# Patient Record
Sex: Female | Born: 1965
Health system: Southern US, Community
[De-identification: ages and names within clinical notes are randomized; demographics above are authoritative.]

## PROBLEM LIST (undated history)

## (undated) DIAGNOSIS — I4729 Other ventricular tachycardia: Secondary | ICD-10-CM

## (undated) DIAGNOSIS — D689 Coagulation defect, unspecified: Secondary | ICD-10-CM

## (undated) DIAGNOSIS — Z91199 Patient's noncompliance with other medical treatment and regimen due to unspecified reason: Secondary | ICD-10-CM

## (undated) DIAGNOSIS — I10 Essential (primary) hypertension: Secondary | ICD-10-CM

## (undated) DIAGNOSIS — J189 Pneumonia, unspecified organism: Secondary | ICD-10-CM

## (undated) DIAGNOSIS — R011 Cardiac murmur, unspecified: Secondary | ICD-10-CM

## (undated) DIAGNOSIS — I639 Cerebral infarction, unspecified: Secondary | ICD-10-CM

## (undated) DIAGNOSIS — I480 Paroxysmal atrial fibrillation: Secondary | ICD-10-CM

## (undated) DIAGNOSIS — I472 Ventricular tachycardia: Secondary | ICD-10-CM

## (undated) DIAGNOSIS — G459 Transient cerebral ischemic attack, unspecified: Secondary | ICD-10-CM

## (undated) DIAGNOSIS — Z9119 Patient's noncompliance with other medical treatment and regimen: Secondary | ICD-10-CM

## (undated) DIAGNOSIS — I4819 Other persistent atrial fibrillation: Secondary | ICD-10-CM

## (undated) HISTORY — DX: Coagulation defect, unspecified: D68.9

## (undated) HISTORY — DX: Other ventricular tachycardia: I47.29

## (undated) HISTORY — DX: Ventricular tachycardia: I47.2

## (undated) HISTORY — DX: Patient's noncompliance with other medical treatment and regimen: Z91.19

## (undated) HISTORY — DX: Essential (primary) hypertension: I10

## (undated) HISTORY — DX: Patient's noncompliance with other medical treatment and regimen due to unspecified reason: Z91.199

---

## 1990-09-15 HISTORY — PX: TUBAL LIGATION: SHX77

## 2002-12-15 ENCOUNTER — Emergency Department (HOSPITAL_COMMUNITY): Admission: EM | Admit: 2002-12-15 | Discharge: 2002-12-15 | Payer: Self-pay | Admitting: Emergency Medicine

## 2003-09-16 ENCOUNTER — Emergency Department (HOSPITAL_COMMUNITY): Admission: EM | Admit: 2003-09-16 | Discharge: 2003-09-17 | Payer: Self-pay | Admitting: Emergency Medicine

## 2004-02-08 ENCOUNTER — Ambulatory Visit (HOSPITAL_COMMUNITY): Admission: RE | Admit: 2004-02-08 | Discharge: 2004-02-08 | Payer: Self-pay | Admitting: Family Medicine

## 2004-04-23 ENCOUNTER — Other Ambulatory Visit: Admission: RE | Admit: 2004-04-23 | Discharge: 2004-04-23 | Payer: Self-pay | Admitting: Family Medicine

## 2004-12-13 ENCOUNTER — Ambulatory Visit: Payer: Self-pay | Admitting: Family Medicine

## 2006-01-07 ENCOUNTER — Ambulatory Visit: Payer: Self-pay | Admitting: Family Medicine

## 2006-02-20 ENCOUNTER — Ambulatory Visit: Payer: Self-pay | Admitting: Family Medicine

## 2006-02-23 ENCOUNTER — Ambulatory Visit: Payer: Self-pay | Admitting: Family Medicine

## 2006-04-06 ENCOUNTER — Ambulatory Visit (HOSPITAL_COMMUNITY): Admission: RE | Admit: 2006-04-06 | Discharge: 2006-04-06 | Payer: Self-pay | Admitting: Family Medicine

## 2006-06-25 ENCOUNTER — Ambulatory Visit: Payer: Self-pay | Admitting: Nurse Practitioner

## 2006-08-17 ENCOUNTER — Ambulatory Visit: Payer: Self-pay | Admitting: Family Medicine

## 2007-04-09 ENCOUNTER — Ambulatory Visit (HOSPITAL_COMMUNITY): Admission: RE | Admit: 2007-04-09 | Discharge: 2007-04-09 | Payer: Self-pay | Admitting: Obstetrics

## 2009-06-05 ENCOUNTER — Emergency Department (HOSPITAL_COMMUNITY): Admission: EM | Admit: 2009-06-05 | Discharge: 2009-06-05 | Payer: Self-pay | Admitting: Family Medicine

## 2010-10-06 ENCOUNTER — Encounter: Payer: Self-pay | Admitting: Obstetrics

## 2011-03-05 ENCOUNTER — Emergency Department (HOSPITAL_COMMUNITY): Payer: Self-pay

## 2011-03-05 ENCOUNTER — Inpatient Hospital Stay (HOSPITAL_COMMUNITY)
Admission: EM | Admit: 2011-03-05 | Discharge: 2011-03-11 | DRG: 193 | Disposition: A | Discharge status: Home / Self Care | Payer: Self-pay | Attending: Internal Medicine | Admitting: Internal Medicine

## 2011-03-05 DIAGNOSIS — F17200 Nicotine dependence, unspecified, uncomplicated: Secondary | ICD-10-CM | POA: Diagnosis present

## 2011-03-05 DIAGNOSIS — J189 Pneumonia, unspecified organism: Principal | ICD-10-CM | POA: Diagnosis present

## 2011-03-05 DIAGNOSIS — R6510 Systemic inflammatory response syndrome (SIRS) of non-infectious origin without acute organ dysfunction: Secondary | ICD-10-CM | POA: Diagnosis present

## 2011-03-05 DIAGNOSIS — J9600 Acute respiratory failure, unspecified whether with hypoxia or hypercapnia: Secondary | ICD-10-CM | POA: Diagnosis present

## 2011-03-05 DIAGNOSIS — I4891 Unspecified atrial fibrillation: Secondary | ICD-10-CM | POA: Diagnosis present

## 2011-03-05 DIAGNOSIS — I4892 Unspecified atrial flutter: Secondary | ICD-10-CM | POA: Diagnosis present

## 2011-03-05 DIAGNOSIS — I119 Hypertensive heart disease without heart failure: Secondary | ICD-10-CM | POA: Diagnosis present

## 2011-03-05 DIAGNOSIS — F1210 Cannabis abuse, uncomplicated: Secondary | ICD-10-CM | POA: Diagnosis present

## 2011-03-05 DIAGNOSIS — E669 Obesity, unspecified: Secondary | ICD-10-CM | POA: Diagnosis present

## 2011-03-05 LAB — POCT I-STAT, CHEM 8
BUN: 13 mg/dL (ref 6–23)
Chloride: 108 mEq/L (ref 96–112)
HCT: 46 % (ref 36.0–46.0)
Sodium: 143 mEq/L (ref 135–145)
TCO2: 24 mmol/L (ref 0–100)

## 2011-03-05 LAB — CBC
MCH: 26.3 pg (ref 26.0–34.0)
MCV: 79.3 fL (ref 78.0–100.0)
Platelets: 200 10*3/uL (ref 150–400)
RBC: 5.13 MIL/uL — ABNORMAL HIGH (ref 3.87–5.11)

## 2011-03-05 LAB — DIFFERENTIAL
Lymphocytes Relative: 19 % (ref 12–46)
Monocytes Absolute: 0.6 10*3/uL (ref 0.1–1.0)
Monocytes Relative: 4 % (ref 3–12)
Neutro Abs: 12.2 10*3/uL — ABNORMAL HIGH (ref 1.7–7.7)

## 2011-03-06 ENCOUNTER — Inpatient Hospital Stay (HOSPITAL_COMMUNITY): Payer: Self-pay

## 2011-03-06 LAB — RAPID URINE DRUG SCREEN, HOSP PERFORMED
Amphetamines: POSITIVE — AB
Barbiturates: NOT DETECTED
Benzodiazepines: NOT DETECTED
Cocaine: NOT DETECTED
Opiates: NOT DETECTED

## 2011-03-06 LAB — GLUCOSE, CAPILLARY

## 2011-03-06 LAB — BLOOD GAS, ARTERIAL
Bicarbonate: 24.3 mEq/L — ABNORMAL HIGH (ref 20.0–24.0)
FIO2: 1 %
Patient temperature: 98.6
TCO2: 25.8 mmol/L (ref 0–100)
pCO2 arterial: 47.6 mmHg — ABNORMAL HIGH (ref 35.0–45.0)
pH, Arterial: 7.329 — ABNORMAL LOW (ref 7.350–7.400)
pO2, Arterial: 61.2 mmHg — ABNORMAL LOW (ref 80.0–100.0)

## 2011-03-06 LAB — POCT I-STAT 3, ART BLOOD GAS (G3+)
Acid-base deficit: 1 mmol/L (ref 0.0–2.0)
Bicarbonate: 21.9 mEq/L (ref 20.0–24.0)
O2 Saturation: 83 %
Patient temperature: 98.6
TCO2: 23 mmol/L (ref 0–100)
TCO2: 23 mmol/L (ref 0–100)
pCO2 arterial: 33.1 mmHg — ABNORMAL LOW (ref 35.0–45.0)
pH, Arterial: 7.425 — ABNORMAL HIGH (ref 7.350–7.400)

## 2011-03-06 LAB — CBC
HCT: 41.2 % (ref 36.0–46.0)
Hemoglobin: 13.4 g/dL (ref 12.0–15.0)
MCH: 26 pg (ref 26.0–34.0)
MCV: 79.8 fL (ref 78.0–100.0)
RBC: 5.16 MIL/uL — ABNORMAL HIGH (ref 3.87–5.11)
WBC: 18.6 10*3/uL — ABNORMAL HIGH (ref 4.0–10.5)

## 2011-03-06 LAB — BASIC METABOLIC PANEL
Calcium: 8.6 mg/dL (ref 8.4–10.5)
Chloride: 105 mEq/L (ref 96–112)
Creatinine, Ser: 0.71 mg/dL (ref 0.50–1.10)
Glucose, Bld: 157 mg/dL — ABNORMAL HIGH (ref 70–99)
Potassium: 3.5 mEq/L (ref 3.5–5.1)

## 2011-03-06 LAB — CARDIAC PANEL(CRET KIN+CKTOT+MB+TROPI)
CK, MB: 2.5 ng/mL (ref 0.3–4.0)
CK, MB: 2.5 ng/mL (ref 0.3–4.0)
Relative Index: 1.7 (ref 0.0–2.5)
Total CK: 142 U/L (ref 7–177)

## 2011-03-07 ENCOUNTER — Inpatient Hospital Stay (HOSPITAL_COMMUNITY): Payer: Self-pay

## 2011-03-07 LAB — POCT I-STAT 3, ART BLOOD GAS (G3+)
O2 Saturation: 91 %
TCO2: 24 mmol/L (ref 0–100)
pH, Arterial: 7.439 — ABNORMAL HIGH (ref 7.350–7.400)

## 2011-03-07 LAB — BASIC METABOLIC PANEL
Calcium: 8.8 mg/dL (ref 8.4–10.5)
GFR calc Af Amer: >60 mL/min (ref >60)
GFR calc non Af Amer: >60 mL/min (ref >60)
Glucose, Bld: 116 mg/dL — ABNORMAL HIGH (ref 70–99)
Sodium: 138 mEq/L (ref 135–145)

## 2011-03-07 LAB — PHOSPHORUS: Phosphorus: 2.6 mg/dL (ref 2.3–4.6)

## 2011-03-07 LAB — HEMOGLOBIN A1C
Hgb A1c MFr Bld: 6.6 % — ABNORMAL HIGH (ref <5.7)
Mean Plasma Glucose: 143 mg/dL — ABNORMAL HIGH (ref <117)

## 2011-03-07 LAB — DIFFERENTIAL
Basophils Relative: 0 % (ref 0–1)
Eosinophils Absolute: 0 10*3/uL (ref 0.0–0.7)
Eosinophils Relative: 0 % (ref 0–5)
Lymphocytes Relative: 12 % (ref 12–46)
Lymphs Abs: 2.2 10*3/uL (ref 0.7–4.0)
Monocytes Relative: 4 % (ref 3–12)
Neutro Abs: 16 10*3/uL — ABNORMAL HIGH (ref 1.7–7.7)

## 2011-03-07 LAB — CBC
MCH: 25.5 pg — ABNORMAL LOW (ref 26.0–34.0)
MCHC: 32 g/dL (ref 30.0–36.0)
MCV: 79.6 fL (ref 78.0–100.0)
Platelets: 198 10*3/uL (ref 150–400)
RBC: 4.51 MIL/uL (ref 3.87–5.11)
WBC: 19.1 10*3/uL — ABNORMAL HIGH (ref 4.0–10.5)

## 2011-03-07 LAB — T4, FREE: Free T4: 0.94 ng/dL (ref 0.80–1.80)

## 2011-03-08 ENCOUNTER — Inpatient Hospital Stay (HOSPITAL_COMMUNITY): Payer: Self-pay

## 2011-03-08 LAB — BASIC METABOLIC PANEL
BUN: 7 mg/dL (ref 6–23)
Creatinine, Ser: 0.69 mg/dL (ref 0.50–1.10)
GFR calc Af Amer: >60 mL/min (ref >60)
GFR calc non Af Amer: >60 mL/min (ref >60)

## 2011-03-08 LAB — DIFFERENTIAL
Eosinophils Relative: 0 % (ref 0–5)
Lymphocytes Relative: 13 % (ref 12–46)
Lymphs Abs: 2.4 10*3/uL (ref 0.7–4.0)
Monocytes Absolute: 0.7 10*3/uL (ref 0.1–1.0)

## 2011-03-08 LAB — CBC
HCT: 35.6 % — ABNORMAL LOW (ref 36.0–46.0)
MCH: 25.3 pg — ABNORMAL LOW (ref 26.0–34.0)
MCHC: 31.7 g/dL (ref 30.0–36.0)
MCV: 79.8 fL (ref 78.0–100.0)
RDW: 14.9 % (ref 11.5–15.5)

## 2011-03-08 LAB — HIV ANTIBODY (ROUTINE TESTING W REFLEX): HIV: NONREACTIVE

## 2011-03-09 ENCOUNTER — Inpatient Hospital Stay (HOSPITAL_COMMUNITY): Payer: Self-pay

## 2011-03-09 DIAGNOSIS — I4892 Unspecified atrial flutter: Secondary | ICD-10-CM

## 2011-03-09 LAB — CBC
HCT: 34.5 % — ABNORMAL LOW (ref 36.0–46.0)
MCV: 78.6 fL (ref 78.0–100.0)
RDW: 14.6 % (ref 11.5–15.5)
WBC: 14.7 10*3/uL — ABNORMAL HIGH (ref 4.0–10.5)

## 2011-03-09 LAB — DIFFERENTIAL
Eosinophils Relative: 1 % (ref 0–5)
Lymphocytes Relative: 17 % (ref 12–46)
Lymphs Abs: 2.4 10*3/uL (ref 0.7–4.0)

## 2011-03-09 LAB — BASIC METABOLIC PANEL
BUN: 7 mg/dL (ref 6–23)
CO2: 27 mEq/L (ref 19–32)
Chloride: 104 mEq/L (ref 96–112)
GFR calc non Af Amer: >60 mL/min (ref >60)
Glucose, Bld: 111 mg/dL — ABNORMAL HIGH (ref 70–99)
Potassium: 3.7 mEq/L (ref 3.5–5.1)

## 2011-03-10 ENCOUNTER — Inpatient Hospital Stay (HOSPITAL_COMMUNITY): Payer: Self-pay

## 2011-03-10 DIAGNOSIS — I4891 Unspecified atrial fibrillation: Secondary | ICD-10-CM

## 2011-03-10 LAB — DIFFERENTIAL
Eosinophils Relative: 2 % (ref 0–5)
Lymphocytes Relative: 19 % (ref 12–46)
Lymphs Abs: 3 10*3/uL (ref 0.7–4.0)
Monocytes Absolute: 0.8 10*3/uL (ref 0.1–1.0)
Monocytes Relative: 6 % (ref 3–12)

## 2011-03-10 LAB — BASIC METABOLIC PANEL
Calcium: 8.7 mg/dL (ref 8.4–10.5)
GFR calc Af Amer: >60 mL/min (ref >60)
GFR calc non Af Amer: >60 mL/min (ref >60)
Potassium: 3.5 mEq/L (ref 3.5–5.1)
Sodium: 139 mEq/L (ref 135–145)

## 2011-03-10 LAB — CBC
HCT: 35.7 % — ABNORMAL LOW (ref 36.0–46.0)
MCH: 26 pg (ref 26.0–34.0)
MCHC: 33.6 g/dL (ref 30.0–36.0)
MCV: 77.4 fL — ABNORMAL LOW (ref 78.0–100.0)
RDW: 14.4 % (ref 11.5–15.5)

## 2011-03-10 LAB — GLUCOSE, CAPILLARY: Glucose-Capillary: 132 mg/dL — ABNORMAL HIGH (ref 70–99)

## 2011-03-10 LAB — HEPARIN LEVEL (UNFRACTIONATED): Heparin Unfractionated: 0.16 IU/mL — ABNORMAL LOW (ref 0.30–0.70)

## 2011-03-10 NOTE — Group Therapy Note (Signed)
[name], [name]                 ACCOUNT NO.:  [id number]  MEDICAL RECORD NO.:  [id number]  LOCATION:  2111                         FACILITY:  [facility]  PHYSICIAN:  [name] Massed, MD    DATE OF BIRTH:  05/09/1966                                PROGRESS NOTE   ONGOING MEDICAL ISSUES: 1. Acute hypoxic respiratory failure secondary to acute lung injury     now resolved. 2. Community-acquired pneumonia. 3. Uncontrolled hypertension significantly better. 4. Tobacco abuse. 5. Methamphetamines detected in urinary drug screen. 6. Obesity.  CONSULTANTS: On the case none.  HISTORY OF PRESENT ILLNESS: The patient is a very pleasant 45 year old African American female who presented to [facility] on the 20th of June with a complaint of cough and shortness of breath.  She evaluated in the ED and found to be profoundly hypoxic and also had significantly elevated blood pressures and was admitted to the hospitalist service in the ICU for further evaluation and management.  For further details, see the history and physical that was dictated by Dr. Suanne Marker on admission.  PERTINENT RADIOLOGICAL STUDIES: 1. X-ray of the chest done on March 05, 2011 showed right upper lobe     pneumonia. 2. X-ray of the chest on March 06, 2011, showed marked worsening of the     right greater than left airspace disease compatible with     progressive pneumonia and/or edema. 3. Portable chest x-ray done on March 07, 2011 showed extensive     airspace disease bilaterally unchanged. 4. Portable x-ray done on March 08, 2011 showed improved airspace     disease. 5. Portable chest x-ray done on March 09, 1011 continues to show     improved bilateral pulmonary infiltrates.  PERTINENT LABORATORY STUDIES: 1. CBC on admission showed WBC of 15.9. 2. ABG on admission showed a pH of 7.4, pCO2 32 and a pO2 of 43. 3. BNP admission was 173. 4. Cardiac enzymes were cycled and these were negative. 5. Procalcitonin was  less than 0.10. 6. Urinary drug screen was positive for amphetamines. 7. Blood cultures March 06, 2011 are negative so far. 8. TSH was 0.704. 9. Hemoglobin A1c is 6.6. 10.HIV antibody was negative. 11.CBC today shows a WBC decreased to 14.7, hemoglobin of 11.1 a     platelet count of 186. 12.BNP is 301. 13.BMET shows a chemistry 140, potassium of 3.7, chloride of 104,     bicarb of 27, glucose of 111, BUN 7, creatinine of 0.62 and calcium     of 3.4.  BRIEF HOSPITAL COURSE: 1. Acute hypoxic failure.  This is secondary to community-acquired     pneumonia.  Likely causing acute lung injury.  She was admitted to     the hospitalist service and admitted to the ICU as well initially.     She was started on broad-spectrum antibiotics, which included     vancomycin, Levaquin, and aztreonam.  She continued to require x-     rays amounts of FIO2 via nasal cannula to maintain her O2     saturations.  With continued medical treatment; however, she     significantly improved and over the past few days  she was actually     been titrated off her oxygen.  Currently her vital signs show O2     saturation of 94% on room air.  Given a significant clinical     improvement it is felt that she no longer requires intensive care     monitoring and is felt to be stable to be transferred to the     regular medical floor.  We will continue her antibiotics as is for     today and we will to be de-escalated in the next 24-48 hours.  A 2-     D echocardiogram is pending and that will need to be done prior to     her discharge as well. 2. Uncontrolled hypertension.  The patient was found to have     uncontrolled hypertension and was started on amlodipine here in the     ICU her blood pressure continues to be high and we will today add     hydrochlorothiazide for further optimal control. 3. Tobacco and polysubstance abuse.  She has been counseled     extensively by me.  DISPOSITION: [name] is significantly  better, but will need a few more days in the hospital for further optimization of her hypertension and to continue observation for her pneumonia.  Otherwise, she can be discharged home.  The patient already has a [facility] appointment set up by our case Theme park manager on April 08, 2011.  Further hospital course, discharge medications, discharge summary will be dictated by the discharging physician.     [name] Massed, MD     SG/MEDQ  D:  03/09/2011  T:  03/09/2011  Job:  [id number]  Electronically Signed by [name] Massed  on 03/10/2011 04:26:04 PM

## 2011-03-10 NOTE — Group Therapy Note (Signed)
  [name], [name]                 ACCOUNT NO.:  [id number]  MEDICAL RECORD NO.:  [id number]  LOCATION:  3733                         FACILITY:  [facility]  PHYSICIAN:  [name] Massed, MD    DATE OF BIRTH:  July 22, 1966                                PROGRESS NOTE   ADDENDUM: After the patient was transferred from the ICU to a regular floor, it was noted that heart rate was in the 140s to 150s.  A 12-lead EKG done showed to be consistent with atrial flutter.  The patient was then given IV Lopressor, which transiently decreased the heart rate back down to the 120s, however, it subsequently rebounded.  The patient was then transferred to telemetry unit where she has been started on a Cardizem infusion.  Her Cardizem infusion has been slowly increased to 15 mg an hour, however, the patient continues to be pretty tachycardic with a heart rate in the 140s to 150s.  I have already spoken to Dr. Viann Fish, Cardiology on-call and he will evaluate and provide [place] with further recommendations in managing this patient.  Please note that this patient is otherwise hemodynamically stable and asymptomatic.     [name] Massed, MD     SG/MEDQ  D:  03/09/2011  T:  03/09/2011  Job:  [id number]  Electronically Signed by [name] Massed  on 03/10/2011 04:26:27 PM

## 2011-03-11 LAB — BASIC METABOLIC PANEL
CO2: 27 mEq/L (ref 19–32)
Calcium: 9.1 mg/dL (ref 8.4–10.5)
Creatinine, Ser: 0.76 mg/dL (ref 0.50–1.10)
GFR calc non Af Amer: >60 mL/min (ref >60)
Glucose, Bld: 100 mg/dL — ABNORMAL HIGH (ref 70–99)
Sodium: 137 mEq/L (ref 135–145)

## 2011-03-11 LAB — CBC
Hemoglobin: 11.5 g/dL — ABNORMAL LOW (ref 12.0–15.0)
MCH: 25.3 pg — ABNORMAL LOW (ref 26.0–34.0)
MCHC: 32.6 g/dL (ref 30.0–36.0)
MCV: 77.6 fL — ABNORMAL LOW (ref 78.0–100.0)
Platelets: 246 10*3/uL (ref 150–400)
RBC: 4.55 MIL/uL (ref 3.87–5.11)

## 2011-03-11 LAB — HEPARIN LEVEL (UNFRACTIONATED): Heparin Unfractionated: 0.38 IU/mL (ref 0.30–0.70)

## 2011-03-12 LAB — CULTURE, BLOOD (ROUTINE X 2)
Culture  Setup Time: 201206210854
Culture: NO GROWTH
Culture: NO GROWTH

## 2011-03-12 NOTE — H&P (Signed)
NAMESTEPHENE, [name]                 ACCOUNT NO.:  [id number]  MEDICAL RECORD NO.:  [id number]  LOCATION:  2111                         FACILITY:  [facility]  PHYSICIAN:  Kela Millin, M.D.DATE OF BIRTH:  08-01-1966  DATE OF ADMISSION:  03/05/2011 DATE OF DISCHARGE:                             HISTORY & PHYSICAL   PRIMARY CARE PHYSICIAN:  Unassigned.  CHIEF COMPLAINT:  Cough and shortness of breath.  HISTORY OF PRESENT ILLNESS:  The patient is a 45 year old black female with no significant past medical history who presents with the above complaints.  She states that for the past 2 weeks she has had a cough productive of whitish to tan sputum.  Today, she felt like she was wheezing and was short of breath and so came to the ED.  She admits to midsternal chest pain that is pleuritic in nature - only with coughing. She denies fevers, dysuria, melena, and no hematochezia.  She admits to headaches, denies dizziness, and no focal weakness.  She was seen in the ED and a chest x-ray showed right upper lobe pneumonia, a pro-brain natriuretic peptide was done and came back at 173.  She was hypoxic in the ED with O2 sats in the 80s and she was placed on supplemental O2 and her O2 sats came up to 90%.  An ABG was done and revealed a pH of 7.42 with a pCO2 of 33, pO2 of 45, and O2 sat of 83%.  The patient's blood pressures were also noted to be markedly elevated in the ED at 197/95, she was admitted for further evaluation and management.  PAST MEDICAL HISTORY:  As above.  MEDICATIONS:  None.  ALLERGIES:  PENICILLIN.  SOCIAL HISTORY:  Positive for cigarettes, 8 per day.  She drinks on weekends about 2 alcoholic drinks and 2 beers at the most and positive marijuana use.  FAMILY HISTORY:  Her father had a stroke.  REVIEW OF SYSTEMS:  As per HPI, other review of systems negative.  PHYSICAL EXAMINATION:  GENERAL:  The patient is an obese middle-aged black female with respirations  nonlabored on face mask. VITAL SIGNS:  Her blood pressure 207/90 in the unit, 198/93 on recheck, pulse 56, and O2 sat 98% on the face mask. HEENT:  PERRL.  EOMI.  Moist mucous membranes and no oral exudates. NECK:  Supple, no adenopathy, no thyromegaly, and no JVD. LUNGS:  Bilateral rhonchi, no wheezes. CARDIOVASCULAR:  Regular rate and rhythm.  Normal S1-S2.  No S3 appreciated. ABDOMEN:  Soft, bowel sounds present, nontender, and nondistended.  No organomegaly and no masses palpable. EXTREMITIES:  No cyanosis and no edema. NEUROLOGIC:  She is alert and oriented x3.  Cranial nerves II-XII grossly intact.  Nonfocal exam.  LABORATORY DATA:  As per HPI.  Also, her white cell count is 15.9, hemoglobin 13.5, hematocrit 40.7, and platelet count 200.  Her sodium is 143 with a potassium of 3.8, chloride is 108, glucose is 120, BUN is 13, and creatinine is 0.8.  ASSESSMENT AND PLAN: 1. Community-acquired pneumonia, right upper lobe with hypoxemia - we     will obtain blood cultures, empiric antibiotics, supplemental     oxygen.  Monitor in the Step-Down Unit and consult with Critical     Care is pending clinical course. 2. Malignant hypertension - labetalol drip, cardiac enzymes, and     follow. 3. Marijuana use - Social Work consult for resources to quit.     Kela Millin, M.D.     ACV/MEDQ  D:  03/06/2011  T:  03/06/2011  Job:  [id number]  Electronically Signed by Donnalee Curry M.D. on 03/12/2011 07:37:51 AM

## 2011-03-18 NOTE — Discharge Summary (Signed)
[name], [name]                 ACCOUNT NO.:  [id number]  MEDICAL RECORD NO.:  [id number]  LOCATION:  3734                         FACILITY:  [facility]  PHYSICIAN:  Marinda Elk, M.D.DATE OF BIRTH:  01/27/1966  DATE OF ADMISSION:  03/05/2011 DATE OF DISCHARGE:                              DISCHARGE SUMMARY   PRIMARY CARE DOCTOR:  None.  DISCHARGE DIAGNOSES: 1. Acute respiratory failure secondary to community-acquired     pneumonia. 2. Community-acquired pneumonia. 3. Paroxysmal atrial fibrillation. 4. Hypertension.  DISCHARGE MEDICATIONS: 1. Tylenol 650 mg q.4 h. p.r.n. 2. Guaifenesin one tablet b.i.d. 3. Levofloxacin 750 mg daily. 4. Metoprolol 25 mg b.i.d. 5. Maxzide. 6. Triamterene and hydrochlorothiazide 37.5/25 tablet daily. 7. Ibuprofen 200 mg daily.  CONSULTANTS:  [facility].  Chest x-ray.  Continued to improved aeration with essentially clear lungs, minimal residual, right upper lobe density noted.  Chest x-ray on March 09, 2011, showed improved pulmonary infiltrate.  Chest x-ray on March 08, 2011, showed improved airspace disease.  Chest x-ray on March 07, 2011, showed extensive airspace disease bilaterally, unchanged.  Chest x-ray on March 06, 2011, showed marked worsening of right greater than left airspace disease compatible with pneumonia.  Chest x-ray right upper lobe pneumonia.  BRIEF ADMITTING HISTORY AND PHYSICAL:  This is a 45 year old female with no significant past medical history says that over the past 2 weeks she has productive cough.  She felt like she was wheezing and shortness of breath with midsternal chest pain, pleuritic in nature.  Denies any fever, chills, or hematochezia.  She came in her to the ED.  Chest x-ray showed right upper lobe pneumonia, and we were asked to admit and further evaluate.  Please refer to the dictation from March 09, 2011, for further details.  HOSPITAL COURSE: 1. Acute respiratory failure,  probably secondary to multifactorial     pneumonia and acute lung injury.  She was admitted to the ICU and     was put on IV antibiotics and monitored closely.  She did not     require intubation.  Her shortness of breath improved.  She was     transferred to the floor.  She was satting 99% on room air. 2. Community-acquired pneumonia.  Initially, she was started on broad-     spectrum antibiotics and also vancomycin due to the concern of     MRSA, but by the fourth day, she was doing okay.  She was     transferred to the floor.  Her antibiotics were de-escalated to     Levaquin, which she will take for 1 more day after discharge. 3. Paroxysmal AFib.  On the unit, she developed paroxysmal AFib.  This     was thought to be probably secondary to her pneumonia and her     hypoxia.  She was put on heparin.  Cardiology was consulted.  They     recommended to try to treat once her heart rate improves and became     bradycardia.  They recommended to titrate down her diltiazem and     discontinue the amiodarone.  Her heart rate remained 60.  She    returned  to sinus rhythm in less than 48 hours.  Her heparin was     stopped.  She was monitored on telemetry overnight and she remained     in sinus rhythm.  She will follow up with her primary care doctor     as an outpatient. 4. Hypertension.  She was initially started on hydrochlorothiazide.     She became hypokalemic.  This was repleted.  She was started on     triamterene.  Her blood pressure has remained borderline high     143/86.  This will be monitored and blood pressure medications     titrated up as an outpatient.  VITALS ON THE DAY OF DISCHARGE:  Temperature 98, pulse is 62, respirations 18, blood pressure 143/86.  She was satting 99% on room air.  LABORATORIES ON THE DAY OF DISCHARGE:  Her mag is 2.2.  White count of 11.8, hemoglobin of 11.5, platelet count of 246.  Her sodium was 137, potassium 3.5, chloride 102, glucose of 100,  bicarb of 27, BUN of 10, creatinine 0.7, calcium of 9.1.  DISPOSITION:  The patient will follow up with Dr. Lanora Manis at [facility]  Here, we will check a BMET to see how her potassium and creatinine is doing, and we will titrate blood pressure medications as needed.     Marinda Elk, M.D.     AF/MEDQ  D:  03/11/2011  T:  03/11/2011  Job:  [id number]  Electronically Signed by Marinda Elk M.D. on 03/18/2011 07:29:44 AM

## 2011-03-18 NOTE — Consult Note (Signed)
[name], [name]                 ACCOUNT NO.:  [id number]  MEDICAL RECORD NO.:  [id number]  LOCATION:  3733                         FACILITY:  [facility]  PHYSICIAN:  Georga Hacking, M.D.DATE OF BIRTH:  04/15/1966  DATE OF CONSULTATION:03/09/11                                 CONSULTATION   REASON FOR CONSULTATION:  I was asked to see this 45 year old female for evaluation of atrial flutter.  The patient has been obese and has a history of substance abuse with marijuana and were very mild alcohol usage as well as some mild tobacco abuse.  She was admitted with pneumonia several days ago and after transfer to the floor this morning became more short of breath and was found to be in atrial flutter.  She was treated with diltiazem drip as well as metoprolol and was in atrial flutter 2:1 block and the hospitalist called me to consult.  After giving an additional bolus of Cardizem, she converted to atrial fibrillation, her rate dropped to 100.  She is really not symptomatic at this time.  She has a history of a cardiac murmur.  Reportedly she denies angina and has no PND, orthopnea, or edema normally.  She has a history of hypertension.  She previously saw Dr. Coral Ceo and was treated with blood pressure medicines and stopped taking them on her own and did not have her blood pressure checked.  She has had hypertension since admission requiring labetalol.  PAST MEDICAL HISTORY:  Hypertension, presumed, denies ulcers or diabetes.  Lipid status is unknown.  SURGERY:  None.  ALLERGIES:  PENICILLIN.  MEDICATIONS PRIOR TO ADMISSION:  None.  FAMILY HISTORY:  Father had a previous stroke and also has heart failure.  Mother is alive and well.  SOCIAL HISTORY:  She is never married.  She has 7 children, formally worked as a Lawyer.  Currently has been working at [employer] for about a month.  She drinks two alcoholic drinks and occasional beer on the weekends.  Also  uses marijuana.  No cocaine usage.  She has smoked half a pack of cigarettes per day for about 2 years.  REVIEW OF SYSTEMS:  She has been obese for several years.  She has occasional headaches.  No eye, ear, nose, or throat problems.  No difficulty swallowing.  Denies diarrhea, constipation, or hematochezia. Occasional urinary infections.  No significant arthritis.  Other than as noted above, remainder of the review of systems is unremarkable.  PHYSICAL EXAMINATION:  GENERAL:  She is an obese middle-aged black female currently in no acute distress. VITAL SIGNS:  Blood pressure currently 130/85, pulse is currently 100 and irregular. SKIN:  Warm and dry. ENT:  EOMI.  PERRLA. CNS:  Clear.  Funduscopic exam was not done.  Pharynx is negative. NECK:  Supple without masses, JVD, thyromegaly, or bruits. LUNGS:  Mild rales right upper lobe, clear in the bases. CARDIAC:  Normal S1 and S2, possible S4 heard, very faint 1/6 systolic murmur. ABDOMEN:  Soft, obese and nontender. EXTREMITIES:  Pulses present at 2+.  There is no venous stasis noted and no edema is noted.  A 12-lead EKG shows left axis deviation, T-wave  changes in the lateral leads, voltage for LVH on admission.  She is in atrial flutter on the most recent EKG with 2:1 block, left atrial enlargement.  Chest x-ray on admission showed normal heart size and right upper lobe pneumonia.  IMPRESSION: 1. Atrial flutter which is converted to atrial fibrillation, currently     is under much better control on intravenous diltiazem. 2. Most likely hypertensive heart disease with longstanding     uncontrolled hypertension by history. 3. Obesity. 4. Substance abuse with marijuana, remote alcohol on weekends and     tobacco. 5. Abnormal EKG most likely represents untreated hypertension.  RECOMMENDATIONS:  The patient is currently controlled with atrial fibrillation.  She has been started on heparin.  I would recommend she have a TSH as  well as an echocardiogram.  I would give her a couple doses of amiodarone to see if we can convert her back to sinus rhythm quickly.  Atrial fibrillation is likely secondary to the recent pneumonia and stress as well as probably underlying hypertensive heart disease.  She was counseled about the importance of antihypertensive treatment to prevent long-term complications of cerebrovascular disease, dialysis, or heart disease.  She needs to stop smoking and lose weight.     Georga Hacking, M.D.     WST/MEDQ  D:  03/09/2011  T:  03/09/2011  Job:  [id number]  cc:   [name] Massed, MD  Electronically Signed by Lacretia Nicks. Donnie Aho M.D. on 03/18/2011 05:13:14 PM

## 2011-09-06 ENCOUNTER — Emergency Department (HOSPITAL_COMMUNITY): Payer: Self-pay

## 2011-09-06 ENCOUNTER — Other Ambulatory Visit: Payer: Self-pay

## 2011-09-06 ENCOUNTER — Encounter: Payer: Self-pay | Admitting: Emergency Medicine

## 2011-09-06 ENCOUNTER — Inpatient Hospital Stay (HOSPITAL_COMMUNITY)
Admission: EM | Admit: 2011-09-06 | Discharge: 2011-09-08 | DRG: 193 | Disposition: A | Discharge status: Home / Self Care | Payer: Self-pay | Attending: Internal Medicine | Admitting: Internal Medicine

## 2011-09-06 DIAGNOSIS — J9600 Acute respiratory failure, unspecified whether with hypoxia or hypercapnia: Secondary | ICD-10-CM | POA: Diagnosis present

## 2011-09-06 DIAGNOSIS — R000 Tachycardia, unspecified: Secondary | ICD-10-CM | POA: Diagnosis present

## 2011-09-06 DIAGNOSIS — J189 Pneumonia, unspecified organism: Secondary | ICD-10-CM

## 2011-09-06 DIAGNOSIS — J9690 Respiratory failure, unspecified, unspecified whether with hypoxia or hypercapnia: Secondary | ICD-10-CM

## 2011-09-06 DIAGNOSIS — I4892 Unspecified atrial flutter: Secondary | ICD-10-CM | POA: Diagnosis present

## 2011-09-06 HISTORY — DX: Pneumonia, unspecified organism: J18.9

## 2011-09-06 HISTORY — DX: Paroxysmal atrial fibrillation: I48.0

## 2011-09-06 LAB — URINALYSIS, ROUTINE W REFLEX MICROSCOPIC
Bilirubin Urine: NEGATIVE
Ketones, ur: NEGATIVE mg/dL
Leukocytes, UA: NEGATIVE
Nitrite: NEGATIVE
Specific Gravity, Urine: 1.012 (ref 1.005–1.030)
Urobilinogen, UA: 0.2 mg/dL (ref 0.0–1.0)

## 2011-09-06 LAB — BASIC METABOLIC PANEL
BUN: 9 mg/dL (ref 6–23)
CO2: 24 mEq/L (ref 19–32)
Calcium: 9 mg/dL (ref 8.4–10.5)
Chloride: 103 mEq/L (ref 96–112)
Creatinine, Ser: 0.8 mg/dL (ref 0.50–1.10)
Glucose, Bld: 132 mg/dL — ABNORMAL HIGH (ref 70–99)

## 2011-09-06 LAB — CBC
HCT: 41.8 % (ref 36.0–46.0)
HCT: 39.5 % (ref 36.0–46.0)
MCH: 26.3 pg (ref 26.0–34.0)
MCHC: 33 g/dL (ref 30.0–36.0)
MCV: 79.6 fL (ref 78.0–100.0)
MCV: 78.7 fL (ref 78.0–100.0)
Platelets: 223 10*3/uL (ref 150–400)
Platelets: 166 10*3/uL (ref 150–400)
RBC: 5.02 MIL/uL (ref 3.87–5.11)
RDW: 14.8 % (ref 11.5–15.5)
RDW: 14.6 % (ref 11.5–15.5)
WBC: 19.3 10*3/uL — ABNORMAL HIGH (ref 4.0–10.5)
WBC: 17.2 10*3/uL — ABNORMAL HIGH (ref 4.0–10.5)

## 2011-09-06 LAB — DIFFERENTIAL
Basophils Absolute: 0 10*3/uL (ref 0.0–0.1)
Basophils Relative: 0 % (ref 0–1)
Eosinophils Absolute: 0 10*3/uL (ref 0.0–0.7)
Eosinophils Relative: 0 % (ref 0–5)
Lymphocytes Relative: 10 % — ABNORMAL LOW (ref 12–46)
Monocytes Absolute: 0.5 10*3/uL (ref 0.1–1.0)

## 2011-09-06 LAB — INFLUENZA PANEL BY PCR (TYPE A & B)
H1N1 flu by pcr: NOT DETECTED
Influenza A By PCR: NEGATIVE
Influenza B By PCR: NEGATIVE

## 2011-09-06 LAB — URINE MICROSCOPIC-ADD ON

## 2011-09-06 LAB — CREATININE, SERUM
Creatinine, Ser: 0.7 mg/dL (ref 0.50–1.10)
GFR calc Af Amer: >90 mL/min (ref >90)
GFR calc non Af Amer: >90 mL/min (ref >90)

## 2011-09-06 LAB — CARDIAC PANEL(CRET KIN+CKTOT+MB+TROPI)
CK, MB: 3.7 ng/mL (ref 0.3–4.0)
Total CK: 144 U/L (ref 7–177)
Troponin I: <0.3 ng/mL (ref <0.30)
Troponin I: <0.3 ng/mL (ref <0.30)

## 2011-09-06 LAB — HIV ANTIBODY (ROUTINE TESTING W REFLEX): HIV: NONREACTIVE

## 2011-09-06 LAB — RAPID URINE DRUG SCREEN, HOSP PERFORMED
Barbiturates: NOT DETECTED
Cocaine: NOT DETECTED
Opiates: NOT DETECTED

## 2011-09-06 LAB — GLUCOSE, CAPILLARY: Glucose-Capillary: 156 mg/dL — ABNORMAL HIGH (ref 70–99)

## 2011-09-06 LAB — PRO B NATRIURETIC PEPTIDE: Pro B Natriuretic peptide (BNP): 435.2 pg/mL — ABNORMAL HIGH (ref 0–125)

## 2011-09-06 LAB — POCT I-STAT 3, ART BLOOD GAS (G3+)
Acid-base deficit: 2 mmol/L (ref 0.0–2.0)
Bicarbonate: 23.4 mEq/L (ref 20.0–24.0)
pCO2 arterial: 41.3 mmHg (ref 35.0–45.0)
pO2, Arterial: 165 mmHg — ABNORMAL HIGH (ref 80.0–100.0)

## 2011-09-06 MED ORDER — OSELTAMIVIR PHOSPHATE 75 MG PO CAPS
75.0000 mg | ORAL_CAPSULE | ORAL | Status: AC
  Filled 2011-09-06: qty 1

## 2011-09-06 MED ORDER — METOPROLOL TARTRATE 1 MG/ML IV SOLN
INTRAVENOUS | Status: AC
  Administered 2011-09-06: 2.5 mg
  Filled 2011-09-06: qty 5

## 2011-09-06 MED ORDER — DEXTROSE 5 % IV SOLN
1.0000 g | Freq: Once | INTRAVENOUS | Status: AC
  Administered 2011-09-06: 1 g via INTRAVENOUS
  Filled 2011-09-06: qty 10

## 2011-09-06 MED ORDER — VANCOMYCIN HCL IN DEXTROSE 1-5 GM/200ML-% IV SOLN
1000.0000 mg | Freq: Three times a day (TID) | INTRAVENOUS | Status: DC
  Administered 2011-09-06 – 2011-09-07 (×3): 1000 mg via INTRAVENOUS
  Filled 2011-09-06 (×6): qty 200

## 2011-09-06 MED ORDER — HEPARIN SODIUM (PORCINE) 5000 UNIT/ML IJ SOLN
5000.0000 [IU] | Freq: Three times a day (TID) | INTRAMUSCULAR | Status: DC
  Administered 2011-09-06 – 2011-09-08 (×6): 5000 [IU] via SUBCUTANEOUS
  Filled 2011-09-06 (×10): qty 1

## 2011-09-06 MED ORDER — ALBUTEROL SULFATE (5 MG/ML) 0.5% IN NEBU
INHALATION_SOLUTION | RESPIRATORY_TRACT | Status: AC
  Filled 2011-09-06: qty 3

## 2011-09-06 MED ORDER — SUCCINYLCHOLINE CHLORIDE 20 MG/ML IJ SOLN
INTRAMUSCULAR | Status: AC
  Filled 2011-09-06: qty 10

## 2011-09-06 MED ORDER — DEXTROSE 5 % IV SOLN
1.0000 g | Freq: Three times a day (TID) | INTRAVENOUS | Status: DC
  Administered 2011-09-06 – 2011-09-07 (×3): 1 g via INTRAVENOUS
  Filled 2011-09-06 (×6): qty 1

## 2011-09-06 MED ORDER — OSELTAMIVIR PHOSPHATE 75 MG PO CAPS
150.0000 mg | ORAL_CAPSULE | Freq: Two times a day (BID) | ORAL | Status: DC
  Administered 2011-09-06 – 2011-09-08 (×5): 150 mg via ORAL
  Filled 2011-09-06 (×8): qty 2

## 2011-09-06 MED ORDER — INFLUENZA VIRUS VACC SPLIT PF IM SUSP
0.5000 mL | INTRAMUSCULAR | Status: AC
  Administered 2011-09-07: 0.5 mL via INTRAMUSCULAR
  Filled 2011-09-06: qty 0.5

## 2011-09-06 MED ORDER — ROCURONIUM BROMIDE 50 MG/5ML IV SOLN
INTRAVENOUS | Status: AC
  Filled 2011-09-06: qty 2

## 2011-09-06 MED ORDER — LIDOCAINE HCL (CARDIAC) 20 MG/ML IV SOLN
INTRAVENOUS | Status: AC
  Filled 2011-09-06: qty 5

## 2011-09-06 MED ORDER — LEVOFLOXACIN IN D5W 750 MG/150ML IV SOLN
750.0000 mg | INTRAVENOUS | Status: AC
  Administered 2011-09-06: 750 mg via INTRAVENOUS
  Filled 2011-09-06: qty 150

## 2011-09-06 MED ORDER — SODIUM CHLORIDE 0.9 % IV SOLN: 250.0000 mL | INTRAVENOUS | Status: DC | PRN

## 2011-09-06 MED ORDER — LEVALBUTEROL HCL 0.63 MG/3ML IN NEBU
0.6300 mg | INHALATION_SOLUTION | RESPIRATORY_TRACT | Status: DC | PRN
  Filled 2011-09-06: qty 3

## 2011-09-06 MED ORDER — PNEUMOCOCCAL VAC POLYVALENT 25 MCG/0.5ML IJ INJ
0.5000 mL | INJECTION | INTRAMUSCULAR | Status: AC
  Administered 2011-09-07: 0.5 mL via INTRAMUSCULAR
  Filled 2011-09-06: qty 0.5

## 2011-09-06 MED ORDER — ETOMIDATE 2 MG/ML IV SOLN
INTRAVENOUS | Status: AC
  Filled 2011-09-06: qty 20

## 2011-09-06 MED ORDER — SODIUM CHLORIDE 0.9 % IV SOLN
INTRAVENOUS | Status: DC
  Administered 2011-09-06: 10:00:00 via INTRAVENOUS

## 2011-09-06 MED ORDER — ACETAMINOPHEN 325 MG PO TABS
650.0000 mg | ORAL_TABLET | Freq: Once | ORAL | Status: AC
  Administered 2011-09-06: 650 mg via ORAL
  Filled 2011-09-06: qty 1
  Filled 2011-09-06: qty 2

## 2011-09-06 MED ORDER — DILTIAZEM LOAD VIA INFUSION
10.0000 mg | Freq: Once | INTRAVENOUS | Status: DC
  Administered 2011-09-06: 10 mg via INTRAVENOUS
  Filled 2011-09-06: qty 10

## 2011-09-06 MED ORDER — METOPROLOL TARTRATE 1 MG/ML IV SOLN
2.5000 mg | Freq: Once | INTRAVENOUS | Status: AC
  Administered 2011-09-06: 2.5 mg via INTRAVENOUS

## 2011-09-06 MED ORDER — DEXTROSE 5 % IV SOLN
5.0000 mg/h | INTRAVENOUS | Status: DC
  Administered 2011-09-06: 10 mg/h via INTRAVENOUS
  Administered 2011-09-07: 20 mg/h via INTRAVENOUS
  Filled 2011-09-06 (×2): qty 100

## 2011-09-06 MED ORDER — ALBUTEROL SULFATE (5 MG/ML) 0.5% IN NEBU
15.0000 mg | INHALATION_SOLUTION | Freq: Once | RESPIRATORY_TRACT | Status: AC
  Administered 2011-09-06: 15 mg via RESPIRATORY_TRACT

## 2011-09-06 MED ORDER — METOPROLOL TARTRATE 1 MG/ML IV SOLN
5.0000 mg | Freq: Four times a day (QID) | INTRAVENOUS | Status: DC
  Administered 2011-09-06: 5 mg via INTRAVENOUS

## 2011-09-06 MED ORDER — LEVOFLOXACIN IN D5W 750 MG/150ML IV SOLN
750.0000 mg | INTRAVENOUS | Status: DC
  Filled 2011-09-06 (×2): qty 150

## 2011-09-06 MED ORDER — METOPROLOL TARTRATE 1 MG/ML IV SOLN
2.5000 mg | Freq: Four times a day (QID) | INTRAVENOUS | Status: DC
  Filled 2011-09-06: qty 5

## 2011-09-06 MED ORDER — PANTOPRAZOLE SODIUM 40 MG IV SOLR
40.0000 mg | Freq: Every day | INTRAVENOUS | Status: DC
  Administered 2011-09-06: 40 mg via INTRAVENOUS
  Filled 2011-09-06 (×2): qty 40

## 2011-09-06 NOTE — Progress Notes (Signed)
Attempted pt off NIV, placed on 50% VM and pt became tachypneic in the 40's and desaturated to the 70's.  Pt placed back o0n NIV and setting changed to 14/8 and 100% Fio2.  ABG drawn.  Will continue to monitor.

## 2011-09-06 NOTE — H&P (Signed)
Name: [name] MRN: [id number] DOB: June 13, 1966    LOS: 0  PCCM ADMISSION NOTE  History of Present Illness:  45 yo female with no significant PMH aside from a recent PNA June 2012, presented 09/06/2011 with 1 day hx SOB, cough with purulent sputum, occasionally streaked with blood and "chest rattle". Has had some subjective fevers and chills, general malaise.  Denies chest pain, leg/calf pain, recent sick contacts. In ER was tachypneic, tachycardic requiring bipap and PCCM asked to admit.    Lines / Drains:   Cultures: BCx2 12/22>>> Urine 12/22>>> Sputum 12/22>>> Legionella 12/22>> Pneumococcal 12/22>> Influenza 12/22>>  Antibiotics: Vancomycin 12/22>>> Levaquin 12/22>>> Azactam 12/22>> Tamiflu 12/22>>  Tests / Events:    Past Medical History  Diagnosis Date  . Pneumonia   . Paroxysmal a-fib    No past surgical history on file. Prior to Admission medications   Not on File   Allergies Allergies  Allergen Reactions  . Penicillins Other (See Comments)    unknown    Family History No family history on file.  Social History History   Social History  . Marital Status: Single    Spouse Name: N/A    Number of Children: N/A  . Years of Education: N/A   Occupational History  . assembly line    Social History Main Topics  . Smoking status: Current Everyday Smoker    Types: Cigarettes  . Smokeless tobacco: Not on file  . Alcohol Use: 2.0 oz/week    4 drink(s) per week  . Drug Use: Not on file  . Sexually Active: Not on file   Other Topics Concern  . Not on file   Social History Narrative  . No narrative on file     Review Of Systems  11 points review of systems is negative with an exception of listed in HPI.  Vital Signs: Temp:  [98.2 F (36.8 C)] 98.2 F (36.8 C) (12/22 0529) Pulse Rate:  [77-90] 83  (12/22 0700) Resp:  [19-52] 31  (12/22 0700) BP: (177-206)/(87-109) 182/100 mmHg (12/22 0700) SpO2:  [77 %-100 %] 100 % (12/22 0700) FiO2  (%):  [60 %-100 %] 100 % (12/22 0755) Weight:  [200 lb (90.719 kg)] 200 lb (90.719 kg) (12/22 0529)    Physical Examination: General:  wdwn female, mod resp distress Neuro: awake and alert, MAE, appropriate CV: s1s2 rrr, no m/r/g, tachy, sinus PULM: resps labored, tachypneic despite bipap, diminished R>L, scattered ronchi GI: and soft, +bs Extremities:  Warm and dry, scant BLE edema    Ventilator settings: Vent Mode:  [-]  FiO2 (%):  [60 %-100 %] 100 %  Labs and Imaging:  CBC    Component Value Date/Time   WBC 19.3* 09/06/2011 0555   RBC 5.25* 09/06/2011 0555   HGB 13.8 09/06/2011 0555   HCT 41.8 09/06/2011 0555   PLT 223 09/06/2011 0555   MCV 79.6 09/06/2011 0555   MCH 26.3 09/06/2011 0555   MCHC 33.0 09/06/2011 0555   RDW 14.8 09/06/2011 0555   LYMPHSABS 1.9 09/06/2011 0555   MONOABS 0.5 09/06/2011 0555   EOSABS 0.0 09/06/2011 0555   BASOSABS 0.0 09/06/2011 0555    BMET    Component Value Date/Time   NA 138 09/06/2011 0555   K 3.5 09/06/2011 0555   CL 103 09/06/2011 0555   CO2 24 09/06/2011 0555   GLUCOSE 132* 09/06/2011 0555   BUN 9 09/06/2011 0555   CREATININE 0.80 09/06/2011 0555   CALCIUM 9.0 09/06/2011 0555  GFRNONAA 86* 09/06/2011 0555   GFRAA >90 09/06/2011 0555    Dg Chest Port 1 View  09/06/2011  *RADIOLOGY REPORT*  Clinical Data: Shortness of breath.  PORTABLE CHEST - 1 VIEW  Comparison: Chest radiograph performed 03/10/2011  Findings: The lungs are well-aerated.  There is diffuse opacification of the right lung, with mild sparing at the right lung apex, and more patchy airspace opacity at the left lung.  This may reflect diffuse pneumonia or possibly pulmonary edema.  No definite pleural effusion or pneumothorax is seen.  The cardiomediastinal silhouette is enlarged, more prominent than on the prior study.  No acute osseous abnormalities are seen.  IMPRESSION:  1.  Diffuse opacification of the right lung, and more mild patchy opacity at the left  lung.  This may reflect diffuse pneumonia or possibly pulmonary edema. 2.  Cardiomegaly noted.  Original Report Authenticated By: Tonia Ghent, M.D.   ABG    Component Value Date/Time   PHART 7.360 09/06/2011 0809   PCO2ART 41.3 09/06/2011 0809   PO2ART 165.0* 09/06/2011 0809   HCO3 23.4 09/06/2011 0809   TCO2 25 09/06/2011 0809   ACIDBASEDEF 2.0 09/06/2011 0809   O2SAT 99.0 09/06/2011 0809     Assessment and Plan: 1. Acute resp failure in setting severe CAP. Remains tachypneic, tachycardic despite bipap, easily desats.  ABG ok. PLAN -  High risk for intubation, but maintaining herself on BPAP for now  rx CAP - see #2 BD prn ?if tachycardia related to continuous nebs in ER F/u CXR F/u ABG Lactate, pct  2.CAP - severe.  Recent CAP June 2012.  ??recurrent pna in young, otherwise healthy.  PLAN -  Check HIV UDS IV abx - vanc, azactam,Levaquin, tamiflu for now -- PCN allergic  F/u CXR Pan culture Droplet isolation  3. Tachycardia - sinus ?from SIRS vs continuous neb tx in ER.  PAF during last admit.  PLAN -  Cardiac panel ECG Limited nebulizer therapy  Best practices / Disposition: -->ICU status under PCCM -->full code -->Heparin for DVT Px -->Protonix for GI Px  I have reviewed above, and examined patient.  Agree with above plan.  Alaysha Jefcoat, MD 09/06/2011  8:27 AM Pager:  [phone]   Critical care time 60 minutes

## 2011-09-06 NOTE — ED Notes (Signed)
Portable CXR and labs, EKG completed.

## 2011-09-06 NOTE — ED Notes (Signed)
Dr. Wickline in room. 

## 2011-09-06 NOTE — ED Provider Notes (Addendum)
History     CSN: [id number]  Arrival date & time 09/06/11  1478   First MD Initiated Contact with Patient 09/06/11 0545      Chief Complaint  Patient presents with  . Shortness of Breath     Patient is a 45 y.o. female presenting with shortness of breath. The history is provided by the patient. The history is limited by the condition of the patient.  Shortness of Breath  The current episode started yesterday. The onset was gradual. The problem occurs continuously. The problem has been rapidly worsening. The problem is severe. The symptoms are relieved by nothing. The symptoms are aggravated by nothing. Associated symptoms include cough and shortness of breath. Pertinent negatives include no chest pain and no fever.  pt reports that she started to have cough/shortness of breath yesterday No CP No syncope Reports h/o pneumonia in the past and she was concerned that could be the cause She has no other known medical problems  Past Medical History  Diagnosis Date  . Pneumonia     No past surgical history on file.  No family history on file.  History  Substance Use Topics  . Smoking status: Not on file  . Smokeless tobacco: Not on file  . Alcohol Use:     OB History    Grav Para Term Preterm Abortions TAB SAB Ect Mult Living                  Review of Systems  Unable to perform ROS: Unstable vital signs  Constitutional: Negative for fever.  Respiratory: Positive for cough and shortness of breath.   Cardiovascular: Negative for chest pain.    Allergies  Penicillins  Home Medications  No current outpatient prescriptions on file.  BP 206/97  Pulse 88  Temp(Src) 98.2 F (36.8 C) (Oral)  Resp 52  Ht 5\' 4"  (1.626 m)  Wt 200 lb (90.719 kg)  BMI 34.33 kg/m2  SpO2 80%  BP 177/98  Pulse 78  Temp(Src) 98.2 F (36.8 C) (Oral)  Resp 20  Ht 5\' 4"  (1.626 m)  Wt 200 lb (90.719 kg)  BMI 34.33 kg/m2  SpO2 100%   Physical Exam Constitutional: well developed,  well nourished, no distress Head and Face: normocephalic/atraumatic Eyes: EOMI/PERRL, conjunctiva pink ENMT: mucous membranes moist Neck: supple, no meningeal signs CV: no murmur/rubs/gallops noted Lungs: tachypneic, rales noted bilaterally Abd: soft, nontender Extremities: full ROM noted, pulses normal/equal, no edema noted Neuro: awake/alert, no distress, appropriate for age, [id number],  Skin: no rash/petechiae noted.  Color normal.  Warm Psych: appropriate for age  ED Course  Procedures   CRITICAL CARE Performed by: Joya Gaskins   Total critical care time: 40  Critical care time was exclusive of separately billable procedures and treating other patients.  Critical care was necessary to treat or prevent imminent or life-threatening deterioration.  Critical care was time spent personally by me on the following activities: development of treatment plan with patient and/or surrogate as well as nursing, discussions with consultants, evaluation of patient's response to treatment, examination of patient, obtaining history from patient or surrogate, ordering and performing treatments and interventions, ordering and review of laboratory studies, ordering and review of radiographic studies, pulse oximetry and re-evaluation of patient's condition.    Labs Reviewed  BASIC METABOLIC PANEL  CBC  DIFFERENTIAL  I-STAT TROPONIN I  PRO B NATRIURETIC PEPTIDE   5:48 AM Called to room due to tachypnea/hypoxia This is improving, though given tachypnea/htn/hypoxia will start bipap  as I am initially concerned for pulmonary edema  6:10 AM Pt tolerating bipap SBP improved  6:29 AM Pt appears improved  7:10 AM D/w dr Craige Cotta, on for PCCM Will admit to ICU Will cover for influenza with tamiflu and also CAP  MDM  Nursing notes reviewed and considered in documentation All labs/vitals reviewed and considered xrays reviewed and considered        Date: 09/06/2011  Rate: 82  Rhythm:  normal sinus rhythm  QRS Axis: normal  Intervals: normal  ST/T Wave abnormalities: nonspecific ST changes  Conduction Disutrbances:none  Narrative Interpretation:   Old EKG Reviewed: changes noted pt has biatrial enlargement when compared to prior    Joya Gaskins, MD 09/06/11 0711  8:19 AM Pt now worsening, tachypneic, but is able to speak to me D/w dr Craige Cotta, pccm, will plan to intubate in ED prior to transfer  Joya Gaskins, MD 09/06/11 [phone]

## 2011-09-06 NOTE — ED Notes (Signed)
Productive cough with SHOB, rattling in chest.

## 2011-09-06 NOTE — Significant Event (Signed)
Has persistent tachycardia.  ECG looks like A flutter.  Will start cardizem gtt.  Check Echo and f/u ECG.

## 2011-09-06 NOTE — Progress Notes (Signed)
MEDICATION RELATED CONSULT NOTE - INITIAL   Pharmacy Consult for Vancomycin, Levofloxacin, and Aztreonam Indication: Severe CAP  Allergies  Allergen Reactions  . Penicillins Other (See Comments)    unknown    Patient Measurements: Height: 5\' 4"  (162.6 cm) Weight: 200 lb (90.719 kg) IBW/kg (Calculated) : 54.7   Vital Signs: Temp: 98.2 F (36.8 C) (12/22 0529) Temp src: Oral (12/22 0529) BP: 168/92 mmHg (12/22 0837) Pulse Rate: 154  (12/22 0837) Intake/Output from previous day:   Intake/Output from this shift:    Labs:  [facility] 09/06/11 0555  WBC 19.3*  HGB 13.8  HCT 41.8  PLT 223  APTT --  CREATININE 0.80  LABCREA --  CREATININE 0.80  CREAT24HRUR --  MG --  PHOS --  ALBUMIN --  PROT --  ALBUMIN --  AST --  ALT --  ALKPHOS --  BILITOT --  BILIDIR --  IBILI --   Estimated Creatinine Clearance: 94.8 ml/min (by C-G formula based on Cr of 0.8).   Microbiology: No results found for this or any previous visit (from the past 720 hour(s)).  Medical History: Past Medical History  Diagnosis Date  . Pneumonia   . Paroxysmal a-fib     Medications:  No prescriptions prior to admission    Assessment: [name] is a 45 YO female with PMH relevant only for an episode of CAP with hospitalization in June 2012. She is currently dependent on BiPAP in the MICU 2/2 possible CAP. Creatinine clearance is estimated 94.8 ml/min, UOP not recorded yet. She is to be started on vancomycin, levofloxacin, and aztreonam per pharmacy. Of note, she carries an allergy to penicillin. [name] is also started on oseltamivir for possible influenza. Sputum and blood cultures are pending.  Goal of Therapy:  Vancomycin trough 15-20 for CAP  Plan:  1. Vancomycin 1 gram IV  q8h 2. Aztreonam 1 gram IV q8h 3. Levofloxacin 750 mg IV daily 4. F/U cultures, respiratory symptoms  [name], [name] 09/06/2011,10:05 AM

## 2011-09-07 ENCOUNTER — Inpatient Hospital Stay (HOSPITAL_COMMUNITY): Payer: Self-pay

## 2011-09-07 DIAGNOSIS — I359 Nonrheumatic aortic valve disorder, unspecified: Secondary | ICD-10-CM

## 2011-09-07 LAB — BASIC METABOLIC PANEL
CO2: 24 mEq/L (ref 19–32)
Chloride: 105 mEq/L (ref 96–112)
GFR calc Af Amer: 85 mL/min — ABNORMAL LOW (ref >90)

## 2011-09-07 LAB — PHOSPHORUS: Phosphorus: 2.7 mg/dL (ref 2.3–4.6)

## 2011-09-07 LAB — BLOOD GAS, ARTERIAL
Acid-Base Excess: 0.3 mmol/L (ref 0.0–2.0)
Delivery systems: POSITIVE
Drawn by: 252031
Expiratory PAP: 7
FIO2: 0.3 %
O2 Saturation: 98.7 %
Patient temperature: 98.6
Pressure support: 9 cmH2O

## 2011-09-07 LAB — CBC
HCT: 37.8 % (ref 36.0–46.0)
Hemoglobin: 12.1 g/dL (ref 12.0–15.0)
MCH: 25.6 pg — ABNORMAL LOW (ref 26.0–34.0)
MCHC: 32 g/dL (ref 30.0–36.0)
MCV: 79.9 fL (ref 78.0–100.0)

## 2011-09-07 LAB — T4, FREE: Free T4: 1 ng/dL (ref 0.80–1.80)

## 2011-09-07 LAB — CARDIAC PANEL(CRET KIN+CKTOT+MB+TROPI)
CK, MB: 4.3 ng/mL — ABNORMAL HIGH (ref 0.3–4.0)
Total CK: 139 U/L (ref 7–177)
Troponin I: <0.3 ng/mL (ref <0.30)

## 2011-09-07 MED ORDER — ACETAMINOPHEN 325 MG PO TABS
650.0000 mg | ORAL_TABLET | Freq: Four times a day (QID) | ORAL | Status: DC | PRN
  Administered 2011-09-07: 650 mg via ORAL
  Filled 2011-09-07: qty 2

## 2011-09-07 MED ORDER — LEVOFLOXACIN 750 MG PO TABS
750.0000 mg | ORAL_TABLET | Freq: Every day | ORAL | Status: DC
  Administered 2011-09-07 – 2011-09-08 (×2): 750 mg via ORAL
  Filled 2011-09-07 (×2): qty 1

## 2011-09-07 MED ORDER — DILTIAZEM LOAD VIA INFUSION
10.0000 mg | Freq: Once | INTRAVENOUS | Status: AC
  Administered 2011-09-07: 10 mg via INTRAVENOUS
  Filled 2011-09-07: qty 10

## 2011-09-07 MED ORDER — PANTOPRAZOLE SODIUM 40 MG PO TBEC
40.0000 mg | DELAYED_RELEASE_TABLET | Freq: Every day | ORAL | Status: DC
  Administered 2011-09-07: 40 mg via ORAL
  Filled 2011-09-07: qty 1

## 2011-09-07 NOTE — Progress Notes (Signed)
eLink Physician-Brief Progress Note Patient Name: [name] DOB: [birth year] MRN: [id number]  Date of Service  09/07/2011   HPI/Events of Note   Patient currently on dilt gtt for Afib/flutter with HR of 151 with BP of 148/102.  Patient had a pause earlier with HR in the 50s and the dilt gtt was reduced back to 5 mg/hr.  Now with HR sustained in the 150s at 20 mg/hr of diltiazem.    eICU Interventions  Plan: Rebolus with 10 mg of diltiazem Continue with 20 mg/hr diltiazem. Consider amiodarone bolus gtt if no change in rhythm.   Intervention Category Major Interventions: Arrhythmia - evaluation and management  DETERDING,ELIZABETH 09/07/2011, 12:30 AM

## 2011-09-07 NOTE — Progress Notes (Signed)
Name: [name] MRN: [id number] DOB: 23-Aug-1966  DOS: 09/07/2011   LOS: 1  CRITICAL CARE PROGRESS NOTE  Patient description: 45 yo female with no significant PMH aside from a recent PNA June 2012, presented 09/06/2011 with 1 day hx SOB, cough with purulent sputum, occasionally streaked with blood and "chest rattle".  In ER was tachypneic, tachycardic requiring bipap and PCCM asked to admit.    Lines / Drains: none  Cultures/sepsis markers:  BCx2 12/22>>>  Urine 12/22>>>  Sputum 12/22>>> NEG Legionella 12/22>>  Pneumococcal 12/22>>  Influenza 12/22>> NEG HIV 12/22>>> NEG  Lactate 12/22>>> 1.6 Pct 12/22 = <0.1  Antibiotics:  Vancomycin 12/22>>>12/23 Levaquin 12/22>>>  Azactam 12/22>>12/23 Tamiflu 12/22>>  Best practices: Protonix for DVT Px Heparin for GI Px  Overnight: Cont tachycardia overnight, now improved on cardizem gtt.  Off bipap.  Feels much better.   Infusions:    . sodium chloride 50 mL/hr at 09/07/11 0900  . diltiazem (CARDIZEM) infusion Stopped (09/07/11 0900)     Intake/Output: 12/22 0701 - 12/23 0700 In: 2000 [I.V.:1400; IV Piggyback:600] Out: 2700 [Urine:2700]    Vital Signs: Temp:  [98.1 F (36.7 C)-102.2 F (39 C)] 98.2 F (36.8 C) (12/23 0800) Pulse Rate:  [63-153] 63  (12/23 0900) Resp:  [22-42] 23  (12/23 0900) BP: (97-164)/(52-104) 131/77 mmHg (12/23 0900) SpO2:  [96 %-100 %] 99 % (12/23 0900) FiO2 (%):  [30 %-100 %] 35 % (12/23 0657)  Physical Examination: General: wdwn female, NAD Neuro: awake and alert, appropriate, MAE CV: s1s2 rrr, now NSR 60's PULM: resps even non labored on [place], diminished bases R>L otherwise CTA  GI: abd soft, +bs Extremities:  Warm and dry, no edema    Basic Metabolic Panel:  Lab 09/07/11 7829 09/06/11 1012 09/06/11 0555  NA 138 -- 138  K 4.0 -- 3.5  CL 105 -- 103  CO2 24 -- 24  GLUCOSE 131* -- 132*  BUN 9 -- 9  CREATININE 0.92 0.70 0.80  CALCIUM 8.9 -- 9.0  MG 1.8 -- --  PHOS 2.7 -- --       CBC:  Lab 09/07/11 0448 09/06/11 1012 09/06/11 0555  WBC 15.9* 17.2* 19.3*  NEUTROABS -- -- 16.9*  HGB 12.1 12.9 13.8  HCT 37.8 39.5 41.8  MCV 79.9 78.7 79.6  PLT 192 166 223   Cardiac Enzymes:  Lab 09/06/11 2327 09/06/11 1645 09/06/11 1012  CKTOTAL 139 126 144  CKMB 4.3* 4.2* 3.7  CKMBINDEX -- -- --  TROPONINI <0.30 <0.30 <0.30   CBG:  Lab 09/06/11 0835  GLUCAP 156*    Intake/Output Summary (Last 24 hours) at 09/07/11 5621 Last data filed at 09/07/11 0900  Gross per 24 hour  Intake   1950 ml  Output   2700 ml  Net   -750 ml     Imaging: Portable Chest Xray In Am  09/07/2011  *RADIOLOGY REPORT*  Clinical Data: Pneumonia, shortness of breath  PORTABLE CHEST - 1 VIEW  Comparison:   the previous day's study  Findings: Significant interval improvement in the airspace disease with only mild alveolar opacities still evident in the right midlung.  Heart size appears mildly enlarged.  No definite effusion.  Regional bones unremarkable.  IMPRESSION:  1.  Significantly improved bilateral airspace disease with mild right midlung residual alveolar opacities.  Original Report Authenticated By: [name], M.D.     Assessment and Plan:  1. Acute resp failure in setting severe CAP. Remains tachypneic, tachycardic despite bipap,  easily desats. ABG ok. Lactate, pct neg.  Much improved overnight.  Now off bipap.  No SOB, no increased WOB.  CXR much improved.  PLAN -  rx CAP - see #2  BD prn  F/u CXR    2.CAP - severe. Recent CAP June 2012. ??recurrent pna in young, otherwise healthy.  HIV, UDS, flu neg.  PLAN -  IV abx - continue Levaquin, tamiflu for now -- PCN allergic  F/u CXR  F/u culture data    3. Tachycardia - Initially sinus.  Overnight AFlutter rx with cardizem gtt.  Now NSR 60's off cardizem. ?from SIRS vs continuous neb tx in ER. PAF during last admit.  PLAN -  Cardiac panel  Echo report pending Limit nebulizer therapy Seen by Dr. Donnie Aho last  admit>>will need to arrange for cardiology follow up  4. Dispo -  Will tx to floor (tele)  Mobilize Anticipate d/c in next 1-2 days   [facility], NP 09/07/2011  9:57 AM  Reviewed above, examined and agree with assessment plan.  Elizabeth Captain, MD 09/07/2011, 10:56 AM Pager:  [phone]

## 2011-09-07 NOTE — Progress Notes (Signed)
eLink Physician-Brief Progress Note Patient Name: [name] DOB: August 28, 1966 MRN: [id number]  Date of Service  09/07/2011   HPI/Events of Note  Fever   eICU Interventions  Order for oral tylenol 650 mg po q6H prn   Intervention Category Minor Interventions: Routine modifications to care plan (e.g. PRN medications for pain, fever)  Dickie Cloe 09/07/2011, 2:38 AM

## 2011-09-07 NOTE — Progress Notes (Signed)
  Echocardiogram 2D Echocardiogram has been performed.  Clide Deutscher RDCS 09/07/2011, 9:39 AM

## 2011-09-07 NOTE — Progress Notes (Signed)
Placed patient on venturi mask at 35%.Will continue to monitor Sp02 and work of breathing

## 2011-09-08 ENCOUNTER — Telehealth: Payer: Self-pay | Admitting: Internal Medicine

## 2011-09-08 LAB — CBC
HCT: 38 % (ref 36.0–46.0)
Hemoglobin: 12.1 g/dL (ref 12.0–15.0)
MCH: 25.6 pg — ABNORMAL LOW (ref 26.0–34.0)
MCHC: 31.8 g/dL (ref 30.0–36.0)

## 2011-09-08 LAB — BASIC METABOLIC PANEL
BUN: 11 mg/dL (ref 6–23)
GFR calc non Af Amer: 71 mL/min — ABNORMAL LOW (ref >90)
Glucose, Bld: 121 mg/dL — ABNORMAL HIGH (ref 70–99)
Potassium: 3.5 mEq/L (ref 3.5–5.1)

## 2011-09-08 MED ORDER — LEVOFLOXACIN 750 MG PO TABS: 750.0000 mg | ORAL_TABLET | Freq: Every day | ORAL | Status: AC

## 2011-09-08 MED ORDER — OSELTAMIVIR PHOSPHATE 75 MG PO CAPS: 75.0000 mg | ORAL_CAPSULE | Freq: Two times a day (BID) | ORAL | Status: AC

## 2011-09-08 MED ORDER — LEVALBUTEROL TARTRATE 45 MCG/ACT IN AERO: 1.0000 | INHALATION_SPRAY | RESPIRATORY_TRACT | Status: DC | PRN

## 2011-09-08 NOTE — Discharge Summary (Signed)
Physician Discharge Summary  Patient ID: [name] MRN: [id number] DOB/AGE: 1965/12/10 45 y.o.  Admit date: 09/06/2011 Discharge date: 09/08/2011    Discharge Diagnoses:  Principal Problem:  *Acute respiratory failure Active Problems:  CAP (community acquired pneumonia)  Tachycardia    Brief Summary: [name] is a 45 y.o. y/o female with no significant past medical history aside from her recent pneumonia in June of 2012 also complicated by paroxysmal atrial fibrillation. She presented on the 22nd with a one-day history of shortness of breath, fevers and chills, cough with purulent sputum, occasionally streaked with blood" chest rattle" feeling very similar to her last pneumonia. In the ER she was very to, tachycardic requiring BiPAP and pulmonary critical care data the patient  Hospital Course by Discharge Summary  Acute respiratory failure in the setting of severe community-acquired pneumonia. Patient did initially require BiPAP x24 hours but improved quickly overnight and chest x-ray continues to improve. Patient was treated with broad-spectrum IV antibiotics, when necessary bronchodilators. At the time of discharge respiratory status is back to her baseline. She's comfortable on room air. No further pulmonary followup needed. She'll be discharged with when necessary bronchodilators only. Patient is poor historian but may have been discharged on Spiriva after her last admission for pneumonia, but she has not been taking this regularly. She does not appear to have underlying lung disease. Will discharge on when necessary Xopenex alone.   CAP (community acquired pneumonia) -- severe community-acquired pneumonia. She also had a recently acquired pneumonia in June of 2012. HIV, flu, urine strep and Legionella were all negative. Sputum and blood cultures have remained negative to date. Patient was initially treated with IV vancomycin, Levaquin, Azactam. She was also empirically treated  with Tamiflu. Again at time of discharge chest x-ray is much improved the patient is comfortable on room air. She will continue 10 day course of antibiotics and complete 7 day course of Tamiflu.   Tachycardia-- initially was normal sinus tach and developed into atrial flutter with rapid ventricular response. Patient responded very quickly to Cardizem bolus and short-term Cardizem drip. She quickly converted back to normal sinus rhythm and has maintained this with controlled heart rate on no medications. Patient also developed paroxysmal atrial fibrillation during the last admission for pneumonia. She was seen in consultation by Dr. Viann Fish we will have her follow up with him as an outpatient.   Lines / Drains:  none   Cultures/sepsis markers:  BCx2 12/22>>>  Urine 12/22>>>  Sputum 12/22>>> NEG  Legionella 12/22>> neg Pneumococcal 12/22>>  Influenza 12/22>> NEG  HIV 12/22>>> NEG  Lactate 12/22>>> 1.6  Pct 12/22 = <0.1   Antibiotics:  Vancomycin 12/22>>>12/23  Levaquin 12/22>>>  Azactam 12/22>>12/23  Tamiflu 12/22>>   Best practices:  Protonix for DVT Px  Heparin for GI Px  Discharge Exam: General: Developed well nourished female in no acute distress Neuro: Awake and alert, moves all extremities, neuro intact CV: S1-S2 regular rate and rhythm no murmurs or gallops sinus rhythm with heart rate in the 70s PULM: Respirations are even and nonlabored on room air, patient is diminished in the bases right greater than left no audible wheeze GI: Abdomen is soft nontender nondistended positive bowel sounds Extremities: Extremities are warm and dry without edema    Discharge Labs BMET    Component Value Date/Time   NA 138 09/08/2011 0620   K 3.5 09/08/2011 0620   CL 104 09/08/2011 0620   CO2 25 09/08/2011 0620   GLUCOSE  121* 09/08/2011 0620   BUN 11 09/08/2011 0620   CREATININE 0.94 09/08/2011 0620   CALCIUM 9.4 09/08/2011 0620   GFRNONAA 71* 09/08/2011 0620   GFRAA 82*  09/08/2011 0620   Lab Results  Component Value Date   WBC 12.2* 09/08/2011   HGB 12.1 09/08/2011   HCT 38.0 09/08/2011   MCV 80.3 09/08/2011   PLT 190 09/08/2011         [place], [place]  Home Medication Instructions [id number]   Printed on:09/08/11 0956  Medication Information                    levofloxacin (LEVAQUIN) 750 MG tablet Take 1 tablet (750 mg total) by mouth daily.           oseltamivir (TAMIFLU) 75 MG capsule Take 1 capsule (75 mg total) by mouth 2 (two) times daily.           levalbuterol (XOPENEX HFA) 45 MCG/ACT inhaler Inhale 1-2 puffs into the lungs every 4 (four) hours as needed for wheezing or shortness of breath.              Follow-up Information    Follow up with Darden Palmer, MD. Make an appointment in 2 weeks. (please call office for appt - [phone])    Contact information:   [address] [place] [address] [phone]       Follow up with [facility]. Make an appointment in 2 weeks.         Disposition: Home or Self Care Discharged Condition: [name] has met maximum benefit of inpatient care and is medically stable and cleared for discharge.  Patient is pending follow up as above.      Time spent on disposition:  Greater than 35 minutes.   SignedDanford Bad, NP 09/08/2011  9:56 AM  *Care during the described time interval was provided by me and/or other providers on the critical care team. I have reviewed this patient's available data, including medical history, events of note, physical examination and test results as part of my evaluation.    STAFF NOTE: See NP note for details. I met with patient. Ok for [employer]. She has no PMD and needs to call [facility]. I will have Pulmonary office call and give her an appt so she has fu atleast temporarily

## 2011-09-08 NOTE — Telephone Encounter (Signed)
Triage  This patient does not have a PMD and needs to call [facility] to make an appt. Pls give her appt to see me in < 3 weeks or NP  Thanks  MR

## 2011-09-08 NOTE — Telephone Encounter (Signed)
[email or web address]  

## 2011-09-08 NOTE — Progress Notes (Signed)
09/08/11 1033 Nursing Note: Pt is to be discharged per MD's order. Pt's IV and cardiac monitor discontinued per MD's order. Pt received all discharge information. Pt verbalized understanding. Pt stable with no complaints at this time. Will escort pt to car safely upon discharge. George Hugh RN

## 2011-09-11 NOTE — Telephone Encounter (Signed)
[email or web address]  

## 2011-09-12 LAB — CULTURE, BLOOD (ROUTINE X 2)
Culture  Setup Time: 201212221853
Culture: NO GROWTH
Culture: NO GROWTH

## 2012-04-14 IMAGING — CR DG CHEST 1V PORT
1 series · 1 of 1 positions shown · non-contrast
Comparison: 03/09/2011

CLINICAL DATA: Respiratory distress

PORTABLE CHEST - 1 VIEW

[view not recorded]
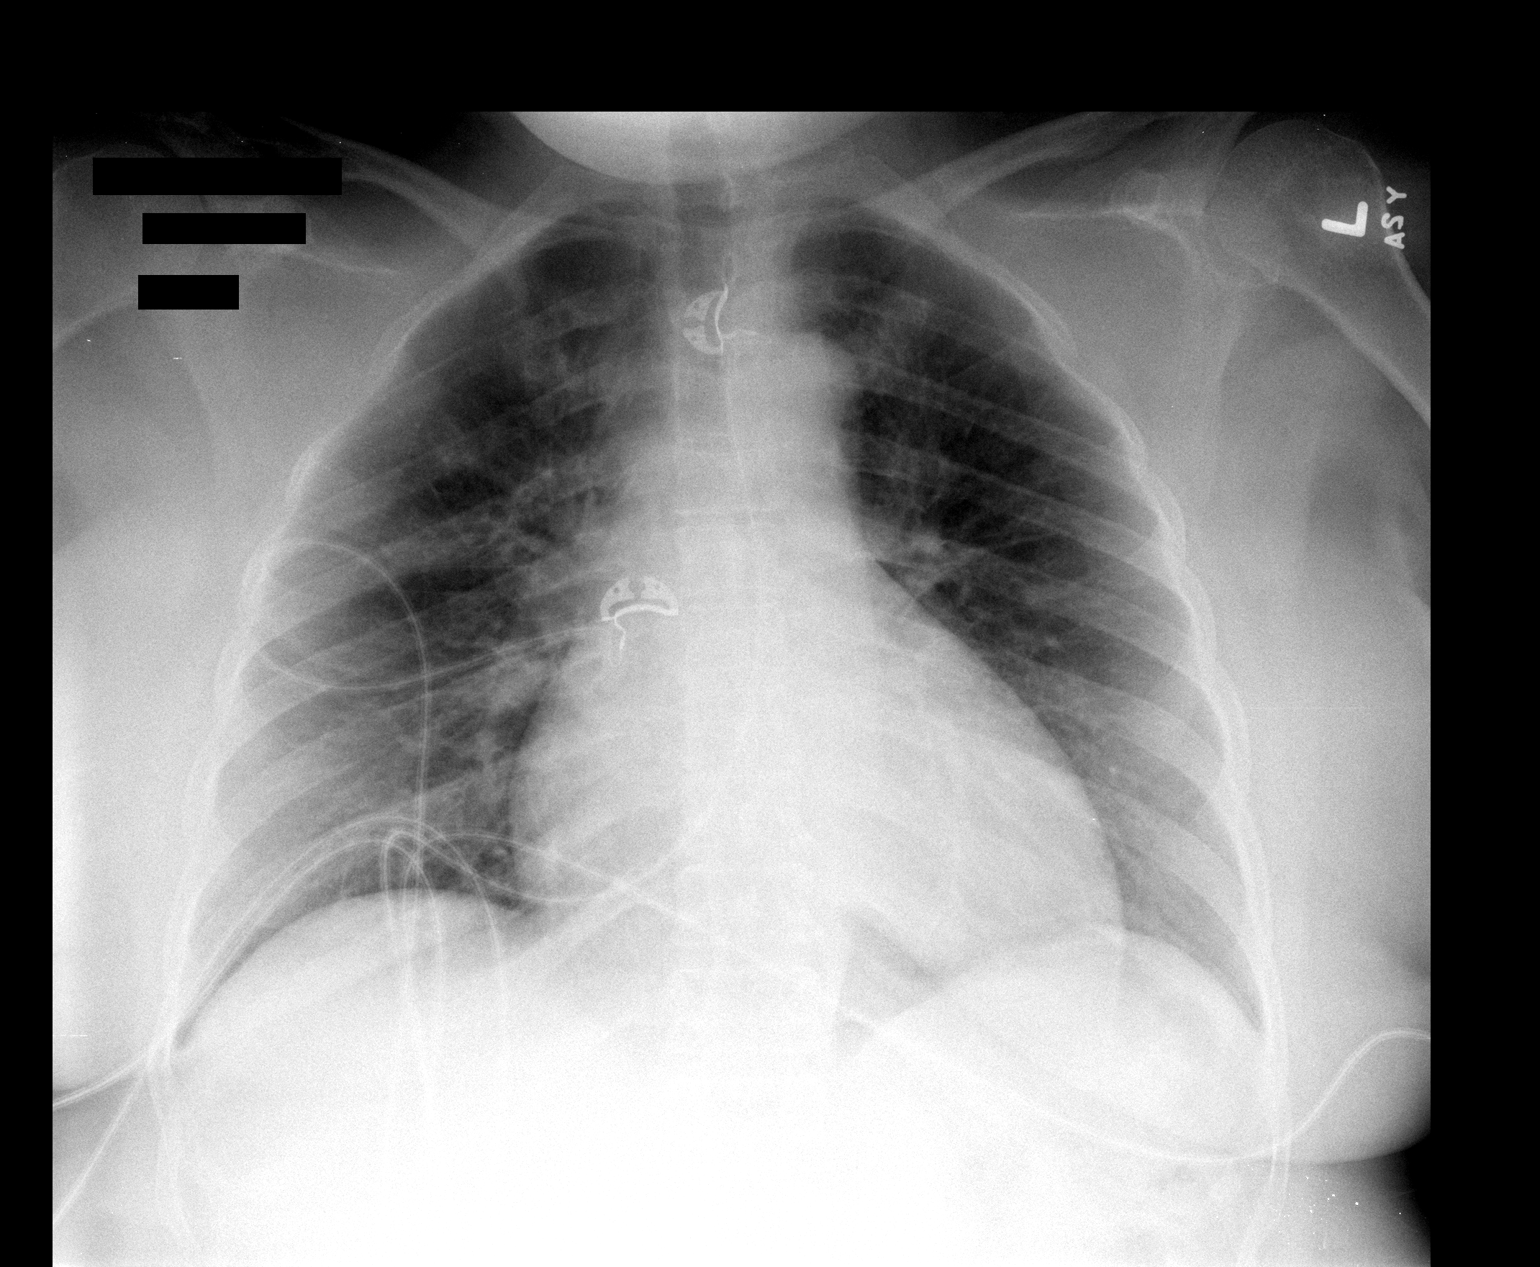

[1 of 1 positions shown; findings below may reference images not displayed]

FINDINGS: The lungs are now essentially clear. Minimal residual
right upper lobe density is noted.  Heart borderline enlarged.  No
pleural fluid.
IMPRESSION: Continued improvement with essentially clear and lungs - minimal
residual right upper lobe density noted.

## 2013-01-13 DIAGNOSIS — G459 Transient cerebral ischemic attack, unspecified: Secondary | ICD-10-CM

## 2013-01-13 HISTORY — DX: Transient cerebral ischemic attack, unspecified: G45.9

## 2013-02-02 ENCOUNTER — Telehealth: Payer: Self-pay | Admitting: General Practice

## 2013-02-04 ENCOUNTER — Telehealth: Payer: Self-pay | Admitting: General Practice

## 2013-02-04 NOTE — Telephone Encounter (Signed)
Called pt, pt will call back to schedule appt

## 2013-02-05 ENCOUNTER — Encounter (HOSPITAL_COMMUNITY): Payer: Self-pay | Admitting: *Deleted

## 2013-02-05 ENCOUNTER — Observation Stay (HOSPITAL_COMMUNITY)
Admission: EM | Admit: 2013-02-05 | Discharge: 2013-02-06 | Disposition: A | Discharge status: Home / Self Care | Payer: MEDICAID | Attending: Internal Medicine | Admitting: Internal Medicine

## 2013-02-05 ENCOUNTER — Observation Stay (HOSPITAL_COMMUNITY): Payer: Self-pay

## 2013-02-05 ENCOUNTER — Emergency Department (HOSPITAL_COMMUNITY): Payer: Self-pay

## 2013-02-05 DIAGNOSIS — G459 Transient cerebral ischemic attack, unspecified: Secondary | ICD-10-CM

## 2013-02-05 DIAGNOSIS — J9600 Acute respiratory failure, unspecified whether with hypoxia or hypercapnia: Secondary | ICD-10-CM

## 2013-02-05 DIAGNOSIS — R000 Tachycardia, unspecified: Secondary | ICD-10-CM

## 2013-02-05 DIAGNOSIS — R51 Headache: Secondary | ICD-10-CM | POA: Insufficient documentation

## 2013-02-05 DIAGNOSIS — R29810 Facial weakness: Principal | ICD-10-CM | POA: Insufficient documentation

## 2013-02-05 DIAGNOSIS — R4789 Other speech disturbances: Secondary | ICD-10-CM | POA: Insufficient documentation

## 2013-02-05 DIAGNOSIS — D72829 Elevated white blood cell count, unspecified: Secondary | ICD-10-CM

## 2013-02-05 DIAGNOSIS — H538 Other visual disturbances: Secondary | ICD-10-CM | POA: Insufficient documentation

## 2013-02-05 DIAGNOSIS — R5381 Other malaise: Secondary | ICD-10-CM | POA: Insufficient documentation

## 2013-02-05 DIAGNOSIS — I10 Essential (primary) hypertension: Secondary | ICD-10-CM | POA: Insufficient documentation

## 2013-02-05 DIAGNOSIS — E876 Hypokalemia: Secondary | ICD-10-CM

## 2013-02-05 DIAGNOSIS — R079 Chest pain, unspecified: Secondary | ICD-10-CM

## 2013-02-05 DIAGNOSIS — R7989 Other specified abnormal findings of blood chemistry: Secondary | ICD-10-CM

## 2013-02-05 DIAGNOSIS — I359 Nonrheumatic aortic valve disorder, unspecified: Secondary | ICD-10-CM

## 2013-02-05 DIAGNOSIS — R404 Transient alteration of awareness: Secondary | ICD-10-CM | POA: Insufficient documentation

## 2013-02-05 DIAGNOSIS — J189 Pneumonia, unspecified organism: Secondary | ICD-10-CM

## 2013-02-05 DIAGNOSIS — I160 Hypertensive urgency: Secondary | ICD-10-CM | POA: Diagnosis present

## 2013-02-05 DIAGNOSIS — R55 Syncope and collapse: Secondary | ICD-10-CM

## 2013-02-05 LAB — TROPONIN I: Troponin I: <0.3 ng/mL (ref <0.30)

## 2013-02-05 LAB — CBC WITH DIFFERENTIAL/PLATELET
Basophils Relative: 0 % (ref 0–1)
Eosinophils Absolute: 0.2 10*3/uL (ref 0.0–0.7)
Eosinophils Relative: 2 % (ref 0–5)
Hemoglobin: 13.7 g/dL (ref 12.0–15.0)
MCH: 27.6 pg (ref 26.0–34.0)
MCHC: 33 g/dL (ref 30.0–36.0)
MCV: 83.7 fL (ref 78.0–100.0)
Monocytes Absolute: 0.6 10*3/uL (ref 0.1–1.0)
Monocytes Relative: 5 % (ref 3–12)
Neutrophils Relative %: 74 % (ref 43–77)

## 2013-02-05 LAB — BASIC METABOLIC PANEL
BUN: 12 mg/dL (ref 6–23)
Calcium: 9.4 mg/dL (ref 8.4–10.5)
Creatinine, Ser: 1 mg/dL (ref 0.50–1.10)
GFR calc Af Amer: 76 mL/min — ABNORMAL LOW (ref >90)
GFR calc non Af Amer: 66 mL/min — ABNORMAL LOW (ref >90)
Potassium: 3.3 mEq/L — ABNORMAL LOW (ref 3.5–5.1)

## 2013-02-05 LAB — GLUCOSE, CAPILLARY: Glucose-Capillary: 110 mg/dL — ABNORMAL HIGH (ref 70–99)

## 2013-02-05 LAB — RAPID URINE DRUG SCREEN, HOSP PERFORMED
Amphetamines: NOT DETECTED
Cocaine: NOT DETECTED
Opiates: NOT DETECTED
Tetrahydrocannabinol: POSITIVE — AB

## 2013-02-05 LAB — MRSA PCR SCREENING: MRSA by PCR: NEGATIVE

## 2013-02-05 LAB — HEMOGLOBIN A1C: Mean Plasma Glucose: 117 mg/dL — ABNORMAL HIGH (ref <117)

## 2013-02-05 MED ORDER — LABETALOL HCL 5 MG/ML IV SOLN
10.0000 mg | Freq: Once | INTRAVENOUS | Status: AC
  Administered 2013-02-05: 10 mg via INTRAVENOUS
  Filled 2013-02-05: qty 4

## 2013-02-05 MED ORDER — ASPIRIN 81 MG PO CHEW
324.0000 mg | CHEWABLE_TABLET | Freq: Once | ORAL | Status: AC
Start: 2013-02-05 — End: 2013-02-05
  Administered 2013-02-05: 324 mg via ORAL
  Filled 2013-02-05: qty 4

## 2013-02-05 MED ORDER — NITROGLYCERIN IN D5W 200-5 MCG/ML-% IV SOLN
5.0000 ug/min | INTRAVENOUS | Status: DC
  Filled 2013-02-05: qty 250

## 2013-02-05 MED ORDER — SODIUM CHLORIDE 0.9 % IV SOLN
INTRAVENOUS | Status: AC
  Administered 2013-02-05: 06:00:00 via INTRAVENOUS

## 2013-02-05 MED ORDER — ASPIRIN 325 MG PO TABS
325.0000 mg | ORAL_TABLET | Freq: Every day | ORAL | Status: DC
  Administered 2013-02-06: 325 mg via ORAL
  Filled 2013-02-05: qty 1

## 2013-02-05 MED ORDER — ENOXAPARIN SODIUM 40 MG/0.4ML ~~LOC~~ SOLN
40.0000 mg | SUBCUTANEOUS | Status: DC
  Administered 2013-02-05 – 2013-02-06 (×2): 40 mg via SUBCUTANEOUS
  Filled 2013-02-05 (×3): qty 0.4

## 2013-02-05 MED ORDER — HYDRALAZINE HCL 25 MG PO TABS
25.0000 mg | ORAL_TABLET | Freq: Four times a day (QID) | ORAL | Status: DC
  Administered 2013-02-05 – 2013-02-06 (×3): 25 mg via ORAL
  Filled 2013-02-05 (×7): qty 1

## 2013-02-05 MED ORDER — STROKE: EARLY STAGES OF RECOVERY BOOK
Freq: Once | Status: AC
  Administered 2013-02-05: 17:00:00
  Filled 2013-02-05: qty 1

## 2013-02-05 MED ORDER — METOPROLOL SUCCINATE ER 50 MG PO TB24
50.0000 mg | ORAL_TABLET | Freq: Every day | ORAL | Status: DC
  Administered 2013-02-05 – 2013-02-06 (×2): 50 mg via ORAL
  Filled 2013-02-05 (×2): qty 1

## 2013-02-05 NOTE — H&P (Signed)
[facility] History and Physical  [name]:[id number] DOB: May 01, 1966 DOA: 02/05/2013  Referring physician: EDP PCP: No primary provider on file.  Specialists:   Chief Complaint: Slurred speech and Right sided Facial Droop  HPI: [name] is a 47 y.o. female who presents to the ED after falling in her tub and had right facial droop and was noticed to be drooling.   Her husband heard her fall and noticed this and called EMS.   She had slurred speech at that time.   The event occurred at 11 pm.   She denies having syncope or chest pain but does report having headache. Her symptoms had resolved by the time she arrived to the ED.    In the ED she was found to have a blood pressure with a systolic of 210, and she was administered IV Labetalol x 1, and a ct scan of the head was performed which was negative for acute findings.   She was referred for medical admission.      Review of Systems: The patient denies anorexia, fever, chills, weight loss, vision loss, decreased hearing, hoarseness, chest pain, syncope, dyspnea on exertion, peripheral edema, balance deficits, hemoptysis, abdominal pain, nausea, vomiting, diarrhea, hematemesis, melena, hematochezia, severe indigestion/heartburn, hematuria, incontinence, , muscle weakness, suspicious skin lesions, transient blindness, difficulty walking, depression, unusual weight change, abnormal bleeding, enlarged lymph nodes, angioedema, and breast masses.    Past Medical History  Diagnosis Date  . Pneumonia   . Paroxysmal a-fib   . Shortness of breath    History reviewed. No pertinent past surgical history.     Medications:  HOME MEDS: Prior to Admission medications   Not on File    Allergies:  Allergies  Allergen Reactions  . Penicillins Other (See Comments)    unknown    Social History:   reports that she has been smoking Cigarettes.  She has been smoking about 0.00 packs per day. She does not have any smokeless tobacco  history on file. She reports that she drinks about 2.0 ounces of alcohol per week. She reports that she does not use illicit drugs.  Family History:  CAD in Father  HTN in paternal Grandmother      Physical Exam:  GEN:  Pleasant 47 year old Obese African American Female examined  and in no acute distress; cooperative with exam Filed Vitals:   02/05/13 0421 02/05/13 0442 02/05/13 0500 02/05/13 0540  BP: 219/100 159/73 169/74 170/86  Pulse: 70  57 61  Temp:      TempSrc:      Resp: 29 24 23 21   SpO2: 100% 99% 98% 99%   Blood pressure 170/86, pulse 61, temperature 98 F (36.7 C), temperature source Oral, resp. rate 21, SpO2 99.00%. PSYCH: She is alert and oriented x4; does not appear anxious does not appear depressed; affect is normal HEENT: Normocephalic and Atraumatic, Mucous membranes pink; PERRLA; EOM intact; Fundi:  Benign;  No scleral icterus, Nares: Patent, Oropharynx: Clear,  Fair Dentition, Neck:  FROM, no cervical lymphadenopathy nor thyromegaly or carotid bruit; no JVD; Breasts:: Not examined CHEST WALL: No tenderness CHEST: Normal respiration, clear to auscultation bilaterally HEART: Regular rate and rhythm; no murmurs rubs or gallops BACK: No kyphosis or scoliosis; no CVA tenderness ABDOMEN: Positive Bowel Sounds,  Obese, soft non-tender; no masses, no organomegaly, no pannus; no intertriginous candida. Rectal Exam: Not done EXTREMITIES: No cyanosis, clubbing or edema; no ulcerations. Genitalia: not examined PULSES: 2+ and symmetric SKIN: Normal hydration  no rash or ulceration CNS: Cranial nerves 2-12 grossly intact no focal neurologic deficit   Labs & Imaging Results for orders placed during the hospital encounter of 02/05/13 (from the past 48 hour(s))  CBC WITH DIFFERENTIAL     Status: Abnormal   Collection Time    02/05/13  2:29 AM      Result Value Range   WBC 13.5 (*) 4.0 - 10.5 K/uL   RBC 4.96  3.87 - 5.11 MIL/uL   Hemoglobin 13.7  12.0 - 15.0 g/dL    HCT 65.7  84.6 - 96.2 %   MCV 83.7  78.0 - 100.0 fL   MCH 27.6  26.0 - 34.0 pg   MCHC 33.0  30.0 - 36.0 g/dL   RDW 95.2  84.1 - 32.4 %   Platelets 206  150 - 400 K/uL   Neutrophils Relative % 74  43 - 77 %   Neutro Abs 10.0 (*) 1.7 - 7.7 K/uL   Lymphocytes Relative 20  12 - 46 %   Lymphs Abs 2.6  0.7 - 4.0 K/uL   Monocytes Relative 5  3 - 12 %   Monocytes Absolute 0.6  0.1 - 1.0 K/uL   Eosinophils Relative 2  0 - 5 %   Eosinophils Absolute 0.2  0.0 - 0.7 K/uL   Basophils Relative 0  0 - 1 %   Basophils Absolute 0.1  0.0 - 0.1 K/uL  BASIC METABOLIC PANEL     Status: Abnormal   Collection Time    02/05/13  2:29 AM      Result Value Range   Sodium 141  135 - 145 mEq/L   Potassium 3.3 (*) 3.5 - 5.1 mEq/L   Chloride 105  96 - 112 mEq/L   CO2 25  19 - 32 mEq/L   Glucose, Bld 114 (*) 70 - 99 mg/dL   BUN 12  6 - 23 mg/dL   Creatinine, Ser 4.01  0.50 - 1.10 mg/dL   Calcium 9.4  8.4 - 02.7 mg/dL   GFR calc non Af Amer 66 (*) >90 mL/min   GFR calc Af Amer 76 (*) >90 mL/min   Comment:            The eGFR has been calculated     using the CKD EPI equation.     This calculation has not been     validated in all clinical     situations.     eGFR's persistently     <90 mL/min signify     possible Chronic Kidney Disease.  TROPONIN I     Status: Abnormal   Collection Time    02/05/13  2:29 AM      Result Value Range   Troponin I 0.41 (*) <0.30 ng/mL   Comment:            Due to the release kinetics of cTnI,     a negative result within the first hours     of the onset of symptoms does not rule out     myocardial infarction with certainty.     If myocardial infarction is still suspected,     repeat the test at appropriate intervals.     CRITICAL RESULT CALLED TO, READ BACK BY AND VERIFIED WITH:     ASHELL RN AT 0415 ON [id number] BY DLONG    Radiological Exams on Admission: Ct Head Wo Contrast  02/05/2013   *RADIOLOGY REPORT*  Clinical Data: Status post fall; slurred  speech and  drooling. Headache and blurry vision.  CT HEAD WITHOUT CONTRAST  Technique:  Contiguous axial images were obtained from the base of the skull through the vertex without contrast.  Comparison: None.  Findings: There is no evidence of acute infarction, mass lesion, or intra- or extra-axial hemorrhage on CT.  The posterior fossa, including the cerebellum, brainstem and fourth ventricle, is within normal limits.  The third and lateral ventricles, and basal ganglia are unremarkable in appearance.  The cerebral hemispheres are symmetric in appearance, with normal gray- white differentiation.  No mass effect or midline shift is seen.  There is no evidence of fracture; visualized osseous structures are unremarkable in appearance.  The orbits are within normal limits. The paranasal sinuses and mastoid air cells are well-aerated.  No significant soft tissue abnormalities are seen.  IMPRESSION: No evidence of traumatic intracranial injury or fracture.   Original Report Authenticated By: Tonia Ghent, M.D.    EKG: Independently reviewed.   Assessment/Plan Principal Problem:   TIA (transient ischemic attack) Active Problems:   Hypertensive urgency   Chest pain   Hypokalemia   Leukocytosis   Paroxysmal Atrial Fibrillation   1.   TIA- TIA workup initiated, MRI/MRA ordered, check fasting Lipids, and perform neuro checks.     2.    P. A. Fib-  Rate control with PRN Lopressor IV, but currently HR= 60.   Not a thrombolytic candidate due to HTN Urgency.    3.    HTN Urgency- IV NTG drip if HR tolerates.  Other wise PRN IV hydralazine.     4.  +TROPONIN- repeat and cycle troponins, IV NTG as BP and HR allow.     5.   Leukocytosis-  Probable stress rxn, monitor trend.        Code Status:     FULL CODE Family Communication:    Husband at Bedside Disposition Plan:    Return to Home on Discharge  Time spent:  51 Minutes  [name] [facility] Pager [phone]  If 7PM-7AM, please contact  night-coverage [email or web address] Password [facility] 02/05/2013, 6:09 AM

## 2013-02-05 NOTE — ED Notes (Signed)
Patient transported to CT 

## 2013-02-05 NOTE — Progress Notes (Signed)
  Echocardiogram 2D Echocardiogram has been performed.  Karenann Mcgrory, [facility] 02/05/2013, 9:47 AM

## 2013-02-05 NOTE — ED Notes (Signed)
Dr. Pollina at bedside   

## 2013-02-05 NOTE — ED Notes (Addendum)
Pt alert and oriented x4. Respirations even and unlabored. Bilateral rise and fall of chest. Skin warm and dry. In no acute distress. Denies needs.  Delay explained to Pt.

## 2013-02-05 NOTE — ED Notes (Signed)
[facility] called for transport. 

## 2013-02-05 NOTE — ED Notes (Addendum)
Imaging at bedside.

## 2013-02-05 NOTE — Progress Notes (Signed)
Admission note  Data: patient admitted via [facility] from [facility] ED via stretcher @ ~1030. Patient alert and oriented x4. Steady gait to bed with standby assist. Face symmetric speech clear. Follows simple commands. See echarting for additional assessment data  Action: oriented patient to room and floor, educated/ updated patient on POC. Initiated stepdown protocol.   Response: patient verbalized understanding of POC and stepdown protocol. Patient safe neurologically stable without sign/ symptoms of deficits

## 2013-02-05 NOTE — ED Provider Notes (Signed)
History     CSN: [id number]  Arrival date & time 02/05/13  0116   First MD Initiated Contact with Patient 02/05/13 0204      Chief Complaint  Patient presents with  . Fall    (Consider location/radiation/quality/duration/timing/severity/associated sxs/prior treatment) HPI Comments: Patient brought to the ER by her husband. Patient reportedly was in the bathtub tonight and fell. She says that she was trying to get out, put her hands on the side of the tub and got onto her knees, slipped and fell. Her husband heard her fall and ran into the bathtub. He reports that she was dazed, having some trouble speaking at that time. He reports slurred speech, drooping of the left side of her face. The symptoms have not resolved. Patient did not notice any weakness in her extremities at that time.  Patient is a 47 y.o. female presenting with fall.  Fall Pertinent negatives include no chest pain and no shortness of breath.    Past Medical History  Diagnosis Date  . Pneumonia   . Paroxysmal a-fib   . Shortness of breath     History reviewed. No pertinent past surgical history.  History reviewed. No pertinent family history.  History  Substance Use Topics  . Smoking status: Current Every Day Smoker    Types: Cigarettes  . Smokeless tobacco: Not on file  . Alcohol Use: 2.0 oz/week    4 drink(s) per week    OB History   Grav Para Term Preterm Abortions TAB SAB Ect Mult Living                  Review of Systems  HENT: Negative for neck pain.   Respiratory: Negative for shortness of breath.   Cardiovascular: Negative for chest pain.  Gastrointestinal: Negative.   Musculoskeletal: Negative for back pain.  Neurological: Positive for syncope and speech difficulty.  All other systems reviewed and are negative.    Allergies  Penicillins  Home Medications  No current outpatient prescriptions on file.  BP 187/86  Pulse 68  Temp(Src) 98.9 F (37.2 C) (Oral)  Resp 20  SpO2  100%  Physical Exam  Constitutional: She is oriented to person, place, and time. She appears well-developed and well-nourished. No distress.  HENT:  Head: Normocephalic and atraumatic.  Right Ear: Hearing normal.  Left Ear: Hearing normal.  Nose: Nose normal.  Mouth/Throat: Oropharynx is clear and moist and mucous membranes are normal.  Eyes: Conjunctivae and EOM are normal. Pupils are equal, round, and reactive to light.  Neck: Normal range of motion. Neck supple.  Cardiovascular: Regular rhythm, S1 normal and S2 normal.  Exam reveals no gallop and no friction rub.   No murmur heard. Pulmonary/Chest: Effort normal and breath sounds normal. No respiratory distress. She exhibits no tenderness.  Abdominal: Soft. Normal appearance and bowel sounds are normal. There is no hepatosplenomegaly. There is no tenderness. There is no rebound, no guarding, no tenderness at McBurney's point and negative Murphy's sign. No hernia.  Musculoskeletal: Normal range of motion.  Neurological: She is alert and oriented to person, place, and time. She has normal strength. No cranial nerve deficit or sensory deficit. Coordination normal. GCS eye subscore is 4. GCS verbal subscore is 5. GCS motor subscore is 6.  Skin: Skin is warm, dry and intact. No rash noted. No cyanosis.  Psychiatric: She has a normal mood and affect. Her speech is normal and behavior is normal. Thought content normal.    ED Course  Procedures (  including critical care time)  Labs Reviewed  CBC WITH DIFFERENTIAL - Abnormal; Notable for the following:    WBC 13.5 (*)    Neutro Abs 10.0 (*)    All other components within normal limits  BASIC METABOLIC PANEL - Abnormal; Notable for the following:    Potassium 3.3 (*)    Glucose, Bld 114 (*)    GFR calc non Af Amer 66 (*)    GFR calc Af Amer 76 (*)    All other components within normal limits  TROPONIN I - Abnormal; Notable for the following:    Troponin I 0.41 (*)    All other  components within normal limits   Ct Head Wo Contrast  02/05/2013   *RADIOLOGY REPORT*  Clinical Data: Status post fall; slurred speech and drooling. Headache and blurry vision.  CT HEAD WITHOUT CONTRAST  Technique:  Contiguous axial images were obtained from the base of the skull through the vertex without contrast.  Comparison: None.  Findings: There is no evidence of acute infarction, mass lesion, or intra- or extra-axial hemorrhage on CT.  The posterior fossa, including the cerebellum, brainstem and fourth ventricle, is within normal limits.  The third and lateral ventricles, and basal ganglia are unremarkable in appearance.  The cerebral hemispheres are symmetric in appearance, with normal gray- white differentiation.  No mass effect or midline shift is seen.  There is no evidence of fracture; visualized osseous structures are unremarkable in appearance.  The orbits are within normal limits. The paranasal sinuses and mastoid air cells are well-aerated.  No significant soft tissue abnormalities are seen.  IMPRESSION: No evidence of traumatic intracranial injury or fracture.   Original Report Authenticated By: Tonia Ghent, M.D.     Diagnosis: 1. TIA vs. Syncope 2. elevated troponin 3. Hypertensive urgency    MDM  Patient comes to the ER for evaluation after a fall. Patient was in the bathtub when the fall occurred. She reports that she can't turn her knees and try to push up with her hand, slipped and hit her head. Husband heard her fall, went into the bathroom and found her with slurred speech, left facial droop and drooling. Patient does not remember this. It's not clear exactly what occurred. Patient did not perceive any numbness, weakness in the extremities either before or after the episode. TIA is certainly a possibility, as the patient might have had unilateral weakness as well as the slurred speech that caused her to fall. Her symptoms are completely resolved at time of arrival to the ER,  and therefore CT is not expected. CT head did not show any injury or obvious ischemia. Patient does have a history of paroxysmal atrial fibrillation and is now anticoagulated. EKG today, however, shows sinus rhythm. AFIB is certainly a risk factor for CVA.  Patient's EKG did show T wave inversions inferior and lateral. These appear to be chronic, however, when compared to previous EKGs. A troponin was ordered and is slightly elevated at 0.41. EKG does not show evidence of clear ischemia and there is no infarct. She is not experiencing chest pain.  The patient's blood pressure was elevated at arrival. Initially, as TIA/CVA was in the differential diagnosis, blood pressure was not treated. She did continue to elevate, went as high as 219 systolic and therefore was administered labetalol with improvement.  Patient will require hospitalization for further workup for syncope including possible TIA and an elevated troponin.      Gilda Crease, MD 02/05/13 [phone]

## 2013-02-05 NOTE — ED Notes (Signed)
[facility] contacted for follow up.

## 2013-02-05 NOTE — ED Notes (Signed)
Nitro drip is still being held due to Pt's HR not meeting parameters.

## 2013-02-05 NOTE — ED Notes (Signed)
Pt states she was in bath tub and fell ,  Pt's husband states her speech was slurred and she was drooling,  Pt states she has a headache, 4/10,  Vision blurry when she fell.  Pt states she doesn't remember the fall but husband at bedside came into bathroom

## 2013-02-05 NOTE — Progress Notes (Signed)
[facility] PROGRESS NOTE  [name]:[id number] DOB: November 10, 1965 DOA: 02/05/2013 PCP: No primary provider on file.  Assessment/Plan: 1. Generalized weakness with focal numbness and facial symmetry in the setting of severe uncontrolled hypertension - differential diagnosis is PRESS versus stroke. Patient was admitted to step down unit and placed on a nitroglycerin drip labetalol as needed to reduce the blood pressure. She was transitioned to metoprolol orally and hydralazine every 6 hours on May 24. MRI of the brain is pending. Echocardiogram confirmed severe left ventricular hypertrophy probably from untreated hypertension. Hemoglobin A1c a fasting lipid panel are pending for tomorrow. The patient was started on aspirin on admission 2. Positive troponins without chest pain - secondary to the cerebral process and LVH. No further cardiac workup is planned during this admission  Code Status: Full code Family Communication: Patient (indicate person spoken with, relationship, and if by phone, the number) Disposition Plan: Home   Consultants:  None  Procedures:  Echocardiogram     HPI/Subjective: No further headache  Objective: Filed Vitals:   02/05/13 1030 02/05/13 1141 02/05/13 1513 02/05/13 1613  BP: 165/95 167/91    Pulse: 54 57 64   Temp: 98.4 F (36.9 C) 98.1 F (36.7 C)  98.8 F (37.1 C)  TempSrc: Oral Oral  Oral  Resp: 14 13    Height: 5\' 3"  (1.6 m)     Weight: 87.3 kg (192 lb 7.4 oz)     SpO2: 100% 100%     Patient Vitals for the past 24 hrs:  BP Temp Temp src Pulse Resp SpO2 Height Weight  02/05/13 1613 - 98.8 F (37.1 C) Oral - - - - -  02/05/13 1513 - - - 64 - - - -  02/05/13 1141 167/91 mmHg 98.1 F (36.7 C) Oral 57 13 100 % - -  02/05/13 1030 165/95 mmHg 98.4 F (36.9 C) Oral 54 14 100 % 5\' 3"  (1.6 m) 87.3 kg (192 lb 7.4 oz)  02/05/13 0945 149/90 mmHg - - 63 19 100 % - -  02/05/13 0900 165/74 mmHg - - 63 23 98 % - -  02/05/13 0800 181/86 mmHg -  - 63 24 97 % - -  02/05/13 0700 145/83 mmHg - - 59 27 98 % - -  02/05/13 0640 156/69 mmHg - - 59 20 98 % - -  02/05/13 0600 145/82 mmHg - - 65 23 100 % - -  02/05/13 0540 170/86 mmHg - - 61 21 99 % - -  02/05/13 0500 169/74 mmHg - - 57 23 98 % - -  02/05/13 0442 159/73 mmHg - - - 24 99 % - -  02/05/13 0421 219/100 mmHg - - 70 29 100 % - -  02/05/13 0406 - 98 F (36.7 C) - - - - - -  02/05/13 0345 - - - - 23 - - -  02/05/13 0135 187/86 mmHg 98.9 F (37.2 C) Oral 68 20 100 % - -     Intake/Output Summary (Last 24 hours) at 02/05/13 1628 Last data filed at 02/05/13 1200  Gross per 24 hour  Intake     75 ml  Output      0 ml  Net     75 ml   Filed Weights   02/05/13 1030  Weight: 87.3 kg (192 lb 7.4 oz)    Exam:   General:  Alert and oriented x3  Cardiovascular: Regular rate and rhythm without murmurs rubs or gallops  Respiratory: Clear  to auscultation bilaterally  Abdomen: Soft nontender  Musculoskeletal: Intact  Neurological exam is nonfocal. NIH stroke scale is zero   Data Reviewed: Basic Metabolic Panel:  Recent Labs Lab 02/05/13 0229  NA 141  K 3.3*  CL 105  CO2 25  GLUCOSE 114*  BUN 12  CREATININE 1.00  CALCIUM 9.4   Liver Function Tests: No results found for this basename: AST, ALT, ALKPHOS, BILITOT, PROT, ALBUMIN,  in the last 168 hours No results found for this basename: LIPASE, AMYLASE,  in the last 168 hours No results found for this basename: AMMONIA,  in the last 168 hours CBC:  Recent Labs Lab 02/05/13 0229  WBC 13.5*  NEUTROABS 10.0*  HGB 13.7  HCT 41.5  MCV 83.7  PLT 206   Cardiac Enzymes:  Recent Labs Lab 02/05/13 0229 02/05/13 0845  TROPONINI 0.41* <0.30   BNP (last 3 results) No results found for this basename: PROBNP,  in the last 8760 hours CBG:  Recent Labs Lab 02/05/13 0743 02/05/13 1237  GLUCAP 107* 110*    No results found for this or any previous visit (from the past 240 hour(s)).   Studies: Ct Head  Wo Contrast  02/05/2013   *RADIOLOGY REPORT*  Clinical Data: Status post fall; slurred speech and drooling. Headache and blurry vision.  CT HEAD WITHOUT CONTRAST  Technique:  Contiguous axial images were obtained from the base of the skull through the vertex without contrast.  Comparison: None.  Findings: There is no evidence of acute infarction, mass lesion, or intra- or extra-axial hemorrhage on CT.  The posterior fossa, including the cerebellum, brainstem and fourth ventricle, is within normal limits.  The third and lateral ventricles, and basal ganglia are unremarkable in appearance.  The cerebral hemispheres are symmetric in appearance, with normal gray- white differentiation.  No mass effect or midline shift is seen.  There is no evidence of fracture; visualized osseous structures are unremarkable in appearance.  The orbits are within normal limits. The paranasal sinuses and mastoid air cells are well-aerated.  No significant soft tissue abnormalities are seen.  IMPRESSION: No evidence of traumatic intracranial injury or fracture.   Original Report Authenticated By: Tonia Ghent, M.D.    Scheduled Meds: .  stroke: mapping our early stages of recovery book   Does not apply Once  . sodium chloride   Intravenous STAT  . aspirin  325 mg Oral Daily  . enoxaparin (LOVENOX) injection  40 mg Subcutaneous Q24H  . hydrALAZINE  25 mg Oral Q6H  . metoprolol succinate  50 mg Oral Daily   Continuous Infusions:   Principal Problem:   TIA (transient ischemic attack) Active Problems:   Hypertensive urgency   Chest pain   Hypokalemia   Leukocytosis      Fredricka Kohrs  [facility] Pager [phone]. If 7PM-7AM, please contact night-coverage at [email or web address], password [facility] 02/05/2013, 4:28 PM  LOS: 0 days

## 2013-02-05 NOTE — ED Notes (Signed)
Critical value reported to [facility] RN

## 2013-02-06 ENCOUNTER — Ambulatory Visit (HOSPITAL_COMMUNITY): Payer: 59

## 2013-02-06 ENCOUNTER — Observation Stay (HOSPITAL_COMMUNITY): Payer: Self-pay

## 2013-02-06 DIAGNOSIS — I10 Essential (primary) hypertension: Secondary | ICD-10-CM

## 2013-02-06 DIAGNOSIS — R55 Syncope and collapse: Secondary | ICD-10-CM

## 2013-02-06 DIAGNOSIS — G459 Transient cerebral ischemic attack, unspecified: Secondary | ICD-10-CM

## 2013-02-06 LAB — LIPID PANEL
Cholesterol: 129 mg/dL (ref 0–200)
LDL Cholesterol: 85 mg/dL (ref 0–99)
Total CHOL/HDL Ratio: 4.3 RATIO
VLDL: 14 mg/dL (ref 0–40)

## 2013-02-06 LAB — HEMOGLOBIN A1C
Hgb A1c MFr Bld: 5.7 % — ABNORMAL HIGH (ref <5.7)
Mean Plasma Glucose: 117 mg/dL — ABNORMAL HIGH (ref <117)

## 2013-02-06 LAB — GLUCOSE, CAPILLARY: Glucose-Capillary: 116 mg/dL — ABNORMAL HIGH (ref 70–99)

## 2013-02-06 MED ORDER — METOPROLOL SUCCINATE ER 50 MG PO TB24: 50.0000 mg | ORAL_TABLET | Freq: Every day | ORAL | Status: DC

## 2013-02-06 MED ORDER — HYDRALAZINE HCL 25 MG PO TABS: 25.0000 mg | ORAL_TABLET | Freq: Three times a day (TID) | ORAL | Status: DC

## 2013-02-06 MED ORDER — ASPIRIN 325 MG PO TABS: 325.0000 mg | ORAL_TABLET | Freq: Every day | ORAL | Status: DC

## 2013-02-06 NOTE — Progress Notes (Signed)
Chaplain responded to [id number] upon nurse's request. Pt changed her mind and declined to complete a Health Care Power of [place] after chaplain explained it to her. Pt thanked chaplain for visiting. Kelle Darting [phone]  02/05/13 1730  Clinical Encounter Type  Visited With Patient  Visit Type Initial;Spiritual support  Referral From Nurse  Spiritual Encounters  Spiritual Needs Other (Comment) (Advance Directives)  Stress Factors  Patient Stress Factors None identified

## 2013-02-06 NOTE — Discharge Summary (Signed)
Physician Discharge Summary  TYSON MASIN ZOX:[id number] DOB: 1966-08-02 DOA: 02/05/2013  PCP: No primary provider on file.  Admit date: 02/05/2013 Discharge date: 02/06/2013  Time spent: 35 minutes  Recommendations for Outpatient Follow-up:  1. BP control - compliance with meds   Discharge Diagnoses:   TIA (transient ischemic attack) - neurological deficits resolved    Hypertensive urgency   Hypokalemia - resolved    Leukocytosis   Discharge Condition: stable   Diet recommendation: heart healthy   Filed Weights   02/05/13 1030  Weight: 87.3 kg (192 lb 7.4 oz)    History of present illness:  [name] is a 47 y.o. female who presents to the ED after falling in her tub and had right facial droop and was noticed to be drooling. Her husband heard her fall and noticed this and called EMS. She had slurred speech at that time. The event occurred at 11 pm. She denies having syncope or chest pain but does report having headache. Her symptoms had resolved by the time she arrived to the ED. In the ED she was found to have a blood pressure with a systolic of 210, and she was administered IV Labetalol x 1, and a ct scan of the head was performed which was negative for acute findings. She was referred for medical admission.      Hospital Course:  1. Generalized weakness with focal numbness and facial symmetry in the setting of severe uncontrolled hypertension - differential diagnosis is PRESS versus minor TIA . Patient was admitted to step down unit and placed on a nitroglycerin drip and labetalol as needed to reduce the blood pressure. She was transitioned to metoprolol orally and hydralazine every 6 hours on May 24 with complete resolution of symptoms Echocardiogram confirmed severe left ventricular hypertrophy probably from untreated hypertension. The patient was started on aspirin on admission and she will continue at DC . Setup for outpt f/u at community clinic.  2. Positive troponins  without chest pain - secondary to the cerebral process and LVH. No further cardiac workup is planned during this admission     Procedures: Echo Carotid US Consultations:  none  Discharge Exam: Filed Vitals:   02/05/13 2307 02/06/13 0258 02/06/13 0802 02/06/13 1212  BP: 160/78 166/79 149/87   Pulse: 58 55 52   Temp: 98.5 F (36.9 C) 98.2 F (36.8 C) 97.9 F (36.6 C) 97.7 F (36.5 C)  TempSrc: Oral Oral Oral Oral  Resp: 25 23 18    Height:      Weight:      SpO2: 98% 97% 97%     General: axox3 Cardiovascular: rrr Respiratory: ctab   Discharge Instructions      Discharge Orders   Future Appointments Provider Department Dept Phone   02/15/2013 1:00 PM [facility] Covering Provider [facility] [place] [phone]   Future Orders Complete By Expires     Diet - low sodium heart healthy  As directed     Increase activity slowly  As directed         Medication List    TAKE these medications       aspirin 325 MG tablet  Take 1 tablet (325 mg total) by mouth daily.     hydrALAZINE 25 MG tablet  Commonly known as:  APRESOLINE  Take 1 tablet (25 mg total) by mouth 3 (three) times daily.     metoprolol succinate 50 MG 24 hr tablet  Commonly known as:  TOPROL-XL  Take 1 tablet (50 mg total) by mouth daily. Take with or immediately following a meal.       Allergies  Allergen Reactions  . Penicillins Other (See Comments)    unknown   Follow-up Information   Follow up with Standley Dakins, MD On 02/15/2013. (1 : 00 pm )    Contact information:   [address] [place] [address] [phone]        The results of significant diagnostics from this hospitalization (including imaging, microbiology, ancillary and laboratory) are listed below for reference.    Significant Diagnostic Studies: Ct Head Wo Contrast  02/05/2013   *RADIOLOGY REPORT*  Clinical Data: Status post fall; slurred speech and drooling. Headache and blurry vision.   CT HEAD WITHOUT CONTRAST  Technique:  Contiguous axial images were obtained from the base of the skull through the vertex without contrast.  Comparison: None.  Findings: There is no evidence of acute infarction, mass lesion, or intra- or extra-axial hemorrhage on CT.  The posterior fossa, including the cerebellum, brainstem and fourth ventricle, is within normal limits.  The third and lateral ventricles, and basal ganglia are unremarkable in appearance.  The cerebral hemispheres are symmetric in appearance, with normal gray- white differentiation.  No mass effect or midline shift is seen.  There is no evidence of fracture; visualized osseous structures are unremarkable in appearance.  The orbits are within normal limits. The paranasal sinuses and mastoid air cells are well-aerated.  No significant soft tissue abnormalities are seen.  IMPRESSION: No evidence of traumatic intracranial injury or fracture.   Original Report Authenticated By: Tonia Ghent, M.D.    Microbiology: Recent Results (from the past 240 hour(s))  MRSA PCR SCREENING     Status: None   Collection Time    02/05/13  3:29 PM      Result Value Range Status   MRSA by PCR NEGATIVE  NEGATIVE Final   Comment:            The GeneXpert MRSA Assay (FDA     approved for NASAL specimens     only), is one component of a     comprehensive MRSA colonization     surveillance program. It is not     intended to diagnose MRSA     infection nor to guide or     monitor treatment for     MRSA infections.     Labs: Basic Metabolic Panel:  Recent Labs Lab 02/05/13 0229  NA 141  K 3.3*  CL 105  CO2 25  GLUCOSE 114*  BUN 12  CREATININE 1.00  CALCIUM 9.4   Liver Function Tests: No results found for this basename: AST, ALT, ALKPHOS, BILITOT, PROT, ALBUMIN,  in the last 168 hours No results found for this basename: LIPASE, AMYLASE,  in the last 168 hours No results found for this basename: AMMONIA,  in the last 168 hours CBC:  Recent  Labs Lab 02/05/13 0229  WBC 13.5*  NEUTROABS 10.0*  HGB 13.7  HCT 41.5  MCV 83.7  PLT 206   Cardiac Enzymes:  Recent Labs Lab 02/05/13 0229 02/05/13 0845 02/05/13 1817  TROPONINI 0.41* <0.30 <0.30   BNP: BNP (last 3 results) No results found for this basename: PROBNP,  in the last 8760 hours CBG:  Recent Labs Lab 02/05/13 1237 02/05/13 1635 02/05/13 2147 02/06/13 0746 02/06/13 1151  GLUCAP 110* 118* 109* 116* 101*       Signed:  Leinani Lisbon  [facility] 02/08/2013,  12:09 PM

## 2013-02-06 NOTE — Progress Notes (Signed)
Pt discharged home per MD order. All discharge instructions were reviewed and all questions answered. Educated pt about new medications ordered.

## 2013-02-06 NOTE — Progress Notes (Signed)
*  PRELIMINARY RESULTS* Vascular Ultrasound Carotid Duplex (Doppler) has been completed.  Preliminary findings: Bilateral:  No evidence of hemodynamically significant internal carotid artery stenosis.   Vertebral artery flow is antegrade.     Farrel Demark, RDMS, RVT  02/06/2013, 10:44 AM

## 2013-02-15 ENCOUNTER — Emergency Department (HOSPITAL_COMMUNITY): Payer: Self-pay

## 2013-02-15 ENCOUNTER — Ambulatory Visit: Payer: Self-pay | Attending: Family Medicine | Admitting: Internal Medicine

## 2013-02-15 ENCOUNTER — Inpatient Hospital Stay (HOSPITAL_COMMUNITY)
Admission: EM | Admit: 2013-02-15 | Discharge: 2013-02-24 | DRG: 310 | Disposition: A | Discharge status: Home / Self Care | Payer: MEDICAID | Attending: Internal Medicine | Admitting: Internal Medicine

## 2013-02-15 ENCOUNTER — Encounter (HOSPITAL_COMMUNITY): Payer: Self-pay | Admitting: *Deleted

## 2013-02-15 VITALS — BP 134/96 | HR 152 | Temp 98.4°F | Resp 16 | Wt 190.0 lb

## 2013-02-15 DIAGNOSIS — F17200 Nicotine dependence, unspecified, uncomplicated: Secondary | ICD-10-CM | POA: Diagnosis present

## 2013-02-15 DIAGNOSIS — I4891 Unspecified atrial fibrillation: Secondary | ICD-10-CM | POA: Diagnosis present

## 2013-02-15 DIAGNOSIS — Z8673 Personal history of transient ischemic attack (TIA), and cerebral infarction without residual deficits: Secondary | ICD-10-CM

## 2013-02-15 DIAGNOSIS — Z7982 Long term (current) use of aspirin: Secondary | ICD-10-CM

## 2013-02-15 DIAGNOSIS — I160 Hypertensive urgency: Secondary | ICD-10-CM

## 2013-02-15 DIAGNOSIS — R000 Tachycardia, unspecified: Secondary | ICD-10-CM | POA: Insufficient documentation

## 2013-02-15 DIAGNOSIS — I10 Essential (primary) hypertension: Secondary | ICD-10-CM | POA: Diagnosis present

## 2013-02-15 DIAGNOSIS — I498 Other specified cardiac arrhythmias: Secondary | ICD-10-CM | POA: Diagnosis present

## 2013-02-15 DIAGNOSIS — F411 Generalized anxiety disorder: Secondary | ICD-10-CM

## 2013-02-15 DIAGNOSIS — I080 Rheumatic disorders of both mitral and aortic valves: Secondary | ICD-10-CM | POA: Diagnosis present

## 2013-02-15 DIAGNOSIS — G4730 Sleep apnea, unspecified: Secondary | ICD-10-CM

## 2013-02-15 DIAGNOSIS — I4892 Unspecified atrial flutter: Principal | ICD-10-CM | POA: Diagnosis present

## 2013-02-15 DIAGNOSIS — Z6833 Body mass index (BMI) 33.0-33.9, adult: Secondary | ICD-10-CM

## 2013-02-15 DIAGNOSIS — Z7901 Long term (current) use of anticoagulants: Secondary | ICD-10-CM

## 2013-02-15 DIAGNOSIS — E669 Obesity, unspecified: Secondary | ICD-10-CM | POA: Diagnosis present

## 2013-02-15 DIAGNOSIS — Z09 Encounter for follow-up examination after completed treatment for conditions other than malignant neoplasm: Secondary | ICD-10-CM | POA: Insufficient documentation

## 2013-02-15 DIAGNOSIS — G459 Transient cerebral ischemic attack, unspecified: Secondary | ICD-10-CM

## 2013-02-15 HISTORY — DX: Transient cerebral ischemic attack, unspecified: G45.9

## 2013-02-15 LAB — BASIC METABOLIC PANEL
BUN: 15 mg/dL (ref 6–23)
Creatinine, Ser: 0.82 mg/dL (ref 0.50–1.10)
GFR calc non Af Amer: 83 mL/min — ABNORMAL LOW (ref >90)
Glucose, Bld: 94 mg/dL (ref 70–99)
Potassium: >7.5 mEq/L (ref 3.5–5.1)

## 2013-02-15 LAB — POCT I-STAT TROPONIN I: Troponin i, poc: 0 ng/mL (ref 0.00–0.08)

## 2013-02-15 LAB — MAGNESIUM: Magnesium: 2.2 mg/dL (ref 1.5–2.5)

## 2013-02-15 LAB — POTASSIUM: Potassium: 4.2 mEq/L (ref 3.5–5.1)

## 2013-02-15 LAB — CBC
Hemoglobin: 14.4 g/dL (ref 12.0–15.0)
MCH: 27.8 pg (ref 26.0–34.0)
MCHC: 33.3 g/dL (ref 30.0–36.0)
RDW: 14.8 % (ref 11.5–15.5)

## 2013-02-15 MED ORDER — ASPIRIN EC 81 MG PO TBEC
81.0000 mg | DELAYED_RELEASE_TABLET | Freq: Every day | ORAL | Status: DC
  Administered 2013-02-16 – 2013-02-24 (×9): 81 mg via ORAL
  Filled 2013-02-15 (×10): qty 1

## 2013-02-15 MED ORDER — DILTIAZEM HCL 100 MG IV SOLR
5.0000 mg/h | INTRAVENOUS | Status: DC
  Administered 2013-02-15 (×2): 10 mg/h via INTRAVENOUS
  Administered 2013-02-15: 5 mg/h via INTRAVENOUS
  Administered 2013-02-16 – 2013-02-19 (×5): 10 mg/h via INTRAVENOUS
  Filled 2013-02-15 (×5): qty 100

## 2013-02-15 MED ORDER — SODIUM CHLORIDE 0.9 % IJ SOLN
3.0000 mL | Freq: Two times a day (BID) | INTRAMUSCULAR | Status: DC
  Administered 2013-02-20 – 2013-02-21 (×2): 3 mL via INTRAVENOUS

## 2013-02-15 MED ORDER — HEPARIN BOLUS VIA INFUSION
4000.0000 [IU] | Freq: Once | INTRAVENOUS | Status: AC
  Administered 2013-02-15: 4000 [IU] via INTRAVENOUS

## 2013-02-15 MED ORDER — HEPARIN (PORCINE) IN NACL 100-0.45 UNIT/ML-% IJ SOLN
1200.0000 [IU]/h | INTRAMUSCULAR | Status: DC
  Administered 2013-02-15 – 2013-02-23 (×9): 1200 [IU]/h via INTRAVENOUS
  Filled 2013-02-15 (×15): qty 250

## 2013-02-15 MED ORDER — METOPROLOL SUCCINATE ER 100 MG PO TB24: 100.0000 mg | ORAL_TABLET | Freq: Every day | ORAL | Status: DC

## 2013-02-15 MED ORDER — METOPROLOL TARTRATE 1 MG/ML IV SOLN
5.0000 mg | Freq: Once | INTRAVENOUS | Status: AC
  Administered 2013-02-15: 5 mg via INTRAVENOUS
  Filled 2013-02-15: qty 5

## 2013-02-15 MED ORDER — HYDRALAZINE HCL 25 MG PO TABS
25.0000 mg | ORAL_TABLET | Freq: Three times a day (TID) | ORAL | Status: DC
  Administered 2013-02-15 – 2013-02-19 (×13): 25 mg via ORAL
  Filled 2013-02-15 (×16): qty 1

## 2013-02-15 MED ORDER — DILTIAZEM HCL 25 MG/5ML IV SOLN
10.0000 mg | Freq: Once | INTRAVENOUS | Status: AC
  Administered 2013-02-15: 10 mg via INTRAVENOUS
  Filled 2013-02-15: qty 5

## 2013-02-15 NOTE — H&P (Signed)
[name] is an 47 y.o. female.   Chief Complaint:  Atrial flutter with rapid ventricular response HPI:   The patient is a 47 year old overweight African American female with a history of paroxysmal intrafibrillation recent TIA, hypertension.  The patient was seen Dr. Thedore Mins today in clinic was noted have a rapid heart rate. EKG revealed atrial flutter with a rapid ventricular response in the 140s. 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  She denies nausea, vomiting, chest pain, dizziness, shortness of breath, orthopnea, PND, abdominal pain, Lotrimin edema, hematochezia, melena.  2-D echocardiogram completed on 02/05/2013 showed ejection fraction of 55-60% with normal wall motion. There is mild aortic valve and mitral valve regurgitation. The left atrium is mildly dilated. PA pressures of 53 mm of mercury.  Past Medical History  Diagnosis Date  . Pneumonia   . Paroxysmal a-fib   . Shortness of breath   . Hypertension     History reviewed. No pertinent past surgical history.  No family history on file. Social History:  reports that she has been smoking Cigarettes.  She has been smoking about 0.00 packs per day. She does not have any smokeless tobacco history on file. She reports that she drinks about 2.0 ounces of alcohol per week. She reports that she does not use illicit drugs.  Allergies:  Allergies  Allergen Reactions  . Penicillins Other (See Comments)    unknown     (Not in a hospital admission)  Results for orders placed during the hospital encounter of 02/15/13 (from the past 48 hour(s))  BASIC METABOLIC PANEL     Status: Abnormal   Collection Time    02/15/13  2:41 PM      Result Value Range   Sodium 135  135 - 145 mEq/L   Potassium >7.5 (*) 3.5 - 5.1 mEq/L   Comment: CRITICAL RESULT CALLED TO, READ BACK BY AND VERIFIED WITH:     B.TREZINL RN 1556 02/15/13 E.GADDY     SLIGHT HEMOLYSIS   Chloride 103  96 -  112 mEq/L   CO2 23  19 - 32 mEq/L   Comment: HEMOLYSIS AT THIS LEVEL MAY AFFECT RESULT     SLIGHT HEMOLYSIS   Glucose, Bld 94  70 - 99 mg/dL   BUN 15  6 - 23 mg/dL   Creatinine, Ser 0.45  0.50 - 1.10 mg/dL   Calcium 9.6  8.4 - 40.9 mg/dL   GFR calc non Af Amer 83 (*) >90 mL/min   GFR calc Af Amer >90  >90 mL/min   Comment:            The eGFR has been calculated     using the CKD EPI equation.     This calculation has not been     validated in all clinical     situations.     eGFR's persistently     <90 mL/min signify     possible Chronic Kidney Disease.  CBC     Status: Abnormal   Collection Time    02/15/13  2:41 PM      Result Value Range   WBC 10.6 (*) 4.0 - 10.5 K/uL   RBC 5.18 (*) 3.87 - 5.11 MIL/uL   Hemoglobin 14.4  12.0 - 15.0 g/dL   HCT 81.1  91.4 - 78.2 %   MCV 83.4  78.0 - 100.0 fL   MCH 27.8  26.0 - 34.0 pg  MCHC 33.3  30.0 - 36.0 g/dL   RDW 16.1  09.6 - 04.5 %   Platelets 228  150 - 400 K/uL  MAGNESIUM     Status: None   Collection Time    02/15/13  2:41 PM      Result Value Range   Magnesium 2.2  1.5 - 2.5 mg/dL  TROPONIN I     Status: None   Collection Time    02/15/13  3:03 PM      Result Value Range   Troponin I <0.30  <0.30 ng/mL   Comment:            Due to the release kinetics of cTnI,     a negative result within the first hours     of the onset of symptoms does not rule out     myocardial infarction with certainty.     If myocardial infarction is still suspected,     repeat the test at appropriate intervals.  POCT I-STAT TROPONIN I     Status: None   Collection Time    02/15/13  3:10 PM      Result Value Range   Troponin i, poc 0.00  0.00 - 0.08 ng/mL   Comment 3            Comment: Due to the release kinetics of cTnI,     a negative result within the first hours     of the onset of symptoms does not rule out     myocardial infarction with certainty.     If myocardial infarction is still suspected,     repeat the test at appropriate  intervals.   Dg Chest 2 View  02/15/2013   *RADIOLOGY REPORT*  Clinical Data: Chest pain.  CHEST - 2 VIEW  Comparison: PA and lateral chest 03/05/2011.  Findings: The lungs are clear.  Heart size is mildly enlarged.  No pneumothorax or pleural effusion.  No focal bony abnormality.  IMPRESSION: Mild cardiomegaly without acute disease.   Original Report Authenticated By: Holley Dexter, M.D.   2D Echo Study Conclusions  - Left ventricle: The cavity size was normal. Wall thickness was increased in a pattern of severe LVH. Systolic function was normal. The estimated ejection fraction was in the range of 55% to 60%. Wall motion was normal; there were no regional wall motion abnormalities. - Aortic valve: Mild regurgitation. - Mitral valve: Mild regurgitation. - Left atrium: The atrium was mildly dilated. - Atrial septum: No defect or patent foramen ovale was identified. - Pulmonary arteries: PA peak pressure: 53mm Hg (S).   Review of Systems  All other systems reviewed and are negative.    Blood pressure 141/86, pulse 48, temperature 98.2 F (36.8 C), temperature source Oral, resp. rate 12, SpO2 99.00%. Physical Exam  Obese, well developed, in no acute distress HEENT: Pupils are equal round react to light accommodation extraocular movements are intact.  Neck: no JVDNo cervical lymphadenopathy. Cardiac: Regular rate and rhythm without murmurs rubs or gallops. Lungs:  clear to auscultation bilaterally, no wheezing, rhonchi or rales Abd: soft, nontender, positive bowel sounds all quadrants, no hepatosplenomegaly Ext: no lower extremity edema.  2+ radial and dorsalis pedis pulses. Skin: warm and dry Neuro:  Grossly normal, Strength 5/5 and equal in the upper extremities.  Assessment/Plan Principal Problem:   Atrial flutter with rapid ventricular response Active Problems:   Hx-TIA (transient ischemic attack) 02/05/13   HTN (hypertension)  Plan:  Admit patient to telemetry. Continue  IV Cardizem for rate control. Start IV heparin per pharmacy consult. Given the patient's recent history of TIA and Chadsvasc score of 4, the patient should continued oral anticoagulation upon discharge. Consider Xarelto vs. Coumadin.  If she does not spontaneously convert on her own with rate control, consider TEE cardioversion.    Becca Bayne 02/15/2013, 4:33 PM

## 2013-02-15 NOTE — H&P (Signed)
Pt. Seen and examined. Agree with the NP/PA-C note as written.  Pleasant 47 yo female with a history of P-afib in the past (associated with PNA) .[name] Apparently she had seen Dr. Donnie Aho.  She was not on anticoagulation. Recently she had a TIA event with slurred speech and confusion that came on when she was in the bathtub - it lasted for 20 minutes and resolved. She was seen in the ER and sent home on Aspirin. She was at her PCP undergoing work-up for this and was found to be in atrial flutter with RVR.  She is unaware of the flutter.  She is now on cardizem.  Plan to initiate heparin. Monitor overnight. No chest pain, but unclear if she has had an ischemia work-up.  Her father has a-fib, recently diagnosed, as well.  She may need TEE/Cardioversion if she does not convert. She will need long-term anticoagulation with warfarin or NOAC.  Chrystie Nose, MD, [facility] Attending Cardiologist [facility]

## 2013-02-15 NOTE — ED Provider Notes (Signed)
History     CSN: [id number]  Arrival date & time 02/15/13  1426   First MD Initiated Contact with Patient 02/15/13 1457      Chief Complaint  Patient presents with  . Tachycardia    (Consider location/radiation/quality/duration/timing/severity/associated sxs/prior treatment) HPI Comments: Patient presents with tachycardia from urgent care.  She was admitted 5/25-26 with TIA symptoms.  Denies any symptoms.  She was having followup from her hospitatlization.  Denies chest pain, SOB, palpitations, fever, vomiting.  No weakness, numbness, tingling.  Good PO intake and urine output.  States she has heard she has had intermittent A fib in the past but never seen a cardiologist.  The history is provided by the patient and the EMS personnel.    Past Medical History  Diagnosis Date  . Pneumonia   . Paroxysmal a-fib   . Shortness of breath   . Hypertension     History reviewed. No pertinent past surgical history.  No family history on file.  History  Substance Use Topics  . Smoking status: Current Every Day Smoker    Types: Cigarettes  . Smokeless tobacco: Not on file  . Alcohol Use: 2.0 oz/week    4 drink(s) per week    OB History   Grav Para Term Preterm Abortions TAB SAB Ect Mult Living                  Review of Systems  Constitutional: Negative for activity change and appetite change.  HENT: Negative for congestion and rhinorrhea.   Respiratory: Negative for cough, chest tightness and shortness of breath.   Cardiovascular: Negative for chest pain and palpitations.  Gastrointestinal: Negative for nausea and abdominal pain.  Genitourinary: Negative for dysuria and hematuria.  Musculoskeletal: Negative for back pain.  Neurological: Negative for dizziness and weakness.  A complete 10 system review of systems was obtained and all systems are negative except as noted in the HPI and PMH.    Allergies  Penicillins  Home Medications   Current Outpatient Rx  Name   Route  Sig  Dispense  Refill  . aspirin 325 MG tablet   Oral   Take 325 mg by mouth daily.         . hydrALAZINE (APRESOLINE) 25 MG tablet   Oral   Take 25 mg by mouth 3 (three) times daily.         . metoprolol (LOPRESSOR) 50 MG tablet   Oral   Take 50 mg by mouth daily.           BP 126/89  Pulse 81  Temp(Src) 98.2 F (36.8 C) (Oral)  Resp 17  Ht 5' 2.99" (1.6 m)  Wt 190 lb 0.6 oz (86.2 kg)  BMI 33.67 kg/m2  SpO2 100%  Physical Exam  Constitutional: She is oriented to person, place, and time. She appears well-developed and well-nourished. No distress.  HENT:  Head: Normocephalic and atraumatic.  Mouth/Throat: Oropharynx is clear and moist. No oropharyngeal exudate.  Eyes: Conjunctivae and EOM are normal. Pupils are equal, round, and reactive to light.  Neck: Normal range of motion. Neck supple.  Cardiovascular: Regular rhythm and normal heart sounds.   No murmur heard. tachydardia  Pulmonary/Chest: Effort normal and breath sounds normal. No respiratory distress.  Abdominal: Soft. There is no tenderness. There is no rebound and no guarding.  Musculoskeletal: Normal range of motion. She exhibits no edema and no tenderness.  Neurological: She is alert and oriented to person, place, and time. No  cranial nerve deficit. She exhibits normal muscle tone. Coordination normal.  Skin: Skin is warm. No rash noted.    ED Course  Procedures (including critical care time)  Labs Reviewed  BASIC METABOLIC PANEL - Abnormal; Notable for the following:    Potassium >7.5 (*)    GFR calc non Af Amer 83 (*)    All other components within normal limits  CBC - Abnormal; Notable for the following:    WBC 10.6 (*)    RBC 5.18 (*)    All other components within normal limits  MAGNESIUM  TROPONIN I  D-DIMER, QUANTITATIVE  POTASSIUM  POCT I-STAT TROPONIN I   Dg Chest 2 View  02/15/2013   *RADIOLOGY REPORT*  Clinical Data: Chest pain.  CHEST - 2 VIEW  Comparison: PA and lateral  chest 03/05/2011.  Findings: The lungs are clear.  Heart size is mildly enlarged.  No pneumothorax or pleural effusion.  No focal bony abnormality.  IMPRESSION: Mild cardiomegaly without acute disease.   Original Report Authenticated By: Holley Dexter, M.D.     1. Atrial flutter with rapid ventricular response   2. HTN (hypertension)   3. TIA (transient ischemic attack)   4. Atrial flutter       MDM  Rapid Atrial flutter without symptoms. Denies chest pain or SOB. Asymptomatic. Recent hospitalization for TIA.  CHADs score 3.  Anticoagulation indicated.  Patient started on IV Cardizem for rapid atrial flutter with RVR. Denies chest pain or shortness of breath. She's has hyperkalemia but is hemolyzed. This will be resent.  Cardiology has been consulted and will admit her for further rate control and anticoagulation.   Date: 02/15/2013  Rate: 152  Rhythm: atrial flutter  QRS Axis: normal  Intervals: normal  ST/T Wave abnormalities: nonspecific ST/T changes  Conduction Disutrbances:none  Narrative Interpretation:   Old EKG Reviewed: changes noted    Glynn Octave, MD 02/15/13 [id number]

## 2013-02-15 NOTE — ED Notes (Signed)
Pt has history of atrial fib and is only on betablocker and asa for control.  [facility] and was found to be in atrial flutter in the 150s.  Pt denies chest pain or sob

## 2013-02-15 NOTE — Progress Notes (Signed)
ANTICOAGULATION CONSULT NOTE - Initial Consult  Pharmacy Consult for heparin Indication: atrial fibrillation  Allergies  Allergen Reactions  . Penicillins Other (See Comments)    unknown    Patient Measurements: Height: 5' 2.99" (160 cm) Weight: 190 lb 0.6 oz (86.2 kg) IBW/kg (Calculated) : 52.38 Heparin Dosing Weight: 86kg  Vital Signs: Temp: 98.2 F (36.8 C) (06/03 1430) Temp src: Oral (06/03 1438) BP: 141/86 mmHg (06/03 1615) Pulse Rate: 48 (06/03 1615)  Labs:  Recent Labs  02/15/13 1441 02/15/13 1503  HGB 14.4  --   HCT 43.2  --   PLT 228  --   CREATININE 0.82  --   TROPONINI  --  <0.30    Estimated Creatinine Clearance: 87.3 ml/min (by C-G formula based on Cr of 0.82).   Medical History: Past Medical History  Diagnosis Date  . Pneumonia   . Paroxysmal a-fib   . Shortness of breath   . Hypertension    Assessment: 47 year old female with PAF presented [facility] after post TIA follow up and was found to be in afib with rvr. CHADS2-vasc score of 4, plan to evaluate anticoagulation this admission. Orders to start heparin protocol. CBC within normal limits, will order baseline INR.  Goal of Therapy:  Heparin level 0.3-0.7 units/ml Monitor platelets by anticoagulation protocol: Yes   Plan:  Give 4000 units bolus x 1 Start heparin infusion at 1200 units/hr Check anti-Xa level in 6 hours and daily while on heparin Continue to monitor H&H and platelets  Sheppard Coil PharmD., BCPS Clinical Pharmacist Pager [phone] 02/15/2013 4:48 PM

## 2013-02-15 NOTE — ED Notes (Signed)
Pt undressed, in gown, on monitor, continuous pulse oximetry and blood pressure cuff 

## 2013-02-15 NOTE — Progress Notes (Signed)
Patient ID: LEVY Murphy, female   DOB: 1966-03-16, 47 y.o.   MRN: [id number]  Patient Demographics  [name], is a 47 y.o. female  [id number]  [id number]  DOB - 1966/07/15  Chief Complaint  Patient presents with  . Hospitalization Follow-up        Subjective:   [name] today has, No headache, No chest pain, No abdominal pain - No Nausea, No new weakness tingling or numbness, No Cough - SOB. She's here for a post hospital followup visit, she was admitted there for TIA and paroxysmal atrial fibrillation.  Objective:   Past Medical History  Diagnosis Date  . Pneumonia   . Paroxysmal a-fib   . Shortness of breath       History reviewed. No pertinent past surgical history.   Filed Vitals:   02/15/13 1350  BP: 134/96  Pulse: 152  Temp: 98.4 F (36.9 C)  Resp: 16  Weight: 190 lb (86.183 kg)  SpO2: 100%     Exam  Awake Alert, Oriented X 3, No new F.N deficits, Normal affect [place].AT,PERRAL Supple Neck,No JVD, No cervical lymphadenopathy appriciated.  Symmetrical Chest wall movement, Good air movement bilaterally, CTAB RRR,No Gallops,Rubs or new Murmurs, No Parasternal Heave, tachycardia +ve B.Sounds, Abd Soft, Non tender, No organomegaly appriciated, No rebound - guarding or rigidity. No Cyanosis, Clubbing or edema, No new Rash or bruise       Data Review   CBC No results found for this basename: WBC, HGB, HCT, PLT, MCV, MCH, MCHC, RDW, NEUTRABS, LYMPHSABS, MONOABS, EOSABS, BASOSABS, BANDABS, BANDSABD,  in the last 168 hours  Chemistries   No results found for this basename: NA, K, CL, CO2, GLUCOSE, BUN, CREATININE, GFRCGP, CALCIUM, MG, AST, ALT, ALKPHOS, BILITOT,  in the last 168 hours ------------------------------------------------------------------------------------------------------------------ No results found for this basename: HGBA1C,  in the last 72  hours ------------------------------------------------------------------------------------------------------------------ No results found for this basename: CHOL, HDL, LDLCALC, TRIG, CHOLHDL, LDLDIRECT,  in the last 72 hours ------------------------------------------------------------------------------------------------------------------ No results found for this basename: TSH, T4TOTAL, FREET3, T3FREE, THYROIDAB,  in the last 72 hours ------------------------------------------------------------------------------------------------------------------ No results found for this basename: VITAMINB12, FOLATE, FERRITIN, TIBC, IRON, RETICCTPCT,  in the last 72 hours  Coagulation profile  No results found for this basename: INR, PROTIME,  in the last 168 hours     Prior to Admission medications   Medication Sig Start Date End Date Taking? Authorizing Provider  aspirin 325 MG tablet Take 1 tablet (325 mg total) by mouth daily. 02/06/13   Sorin Luanne Bras, MD  hydrALAZINE (APRESOLINE) 25 MG tablet Take 1 tablet (25 mg total) by mouth 3 (three) times daily. 02/06/13   Sorin Luanne Bras, MD  metoprolol succinate (TOPROL-XL) 50 MG 24 hr tablet Take 1 tablet (50 mg total) by mouth daily. Take with or immediately following a meal. 02/06/13   Sorin Luanne Bras, MD     Assessment & Plan     1. Post TIA followup. Patient is symptom free at, no new focal neurological deficits, continue aspirin, her lipid panel echogram and carotid duplex were stable. She has been strongly counseled to quit smoking.   2. Tachycardia on exam, history of paroxysmal atrial fibrillation. Patient does have hypertension and TIA, EKG here shows atrial flutter, her recent echogram and TSH are stable, she will be transported to the ER, she should see a cardiologist and get evaluated for long-term anticoagulation. Her chart score certainly appears to be greater than 2. I have adjusted her blood pressure medications I  will discontinue her hydralazine  and increase her Toprol-XL.     Leroy Sea M.D on 02/15/2013 at 2:02 PM

## 2013-02-15 NOTE — Progress Notes (Signed)
Patient here for hospital follow up hypertension

## 2013-02-15 NOTE — Patient Instructions (Signed)
Stop smoking, follow heart healthy low carbohydrate diet. 30 minutes of low impact aerobic exercise 5 times a week .

## 2013-02-15 NOTE — Progress Notes (Signed)
Unit CM UR Completed by [facility] ED CM  W. Shataria Crist RN  

## 2013-02-15 NOTE — ED Notes (Signed)
Report given to Chasity, RN. 

## 2013-02-16 ENCOUNTER — Encounter (HOSPITAL_COMMUNITY): Payer: Self-pay | Admitting: General Practice

## 2013-02-16 DIAGNOSIS — R000 Tachycardia, unspecified: Secondary | ICD-10-CM

## 2013-02-16 DIAGNOSIS — Z8673 Personal history of transient ischemic attack (TIA), and cerebral infarction without residual deficits: Secondary | ICD-10-CM

## 2013-02-16 LAB — CBC
HCT: 39.2 % (ref 36.0–46.0)
MCHC: 33.4 g/dL (ref 30.0–36.0)
Platelets: 184 10*3/uL (ref 150–400)
RDW: 14.4 % (ref 11.5–15.5)
WBC: 10.2 10*3/uL (ref 4.0–10.5)

## 2013-02-16 LAB — HEPARIN LEVEL (UNFRACTIONATED)
Heparin Unfractionated: 0.66 IU/mL (ref 0.30–0.70)
Heparin Unfractionated: 0.6 IU/mL (ref 0.30–0.70)

## 2013-02-16 LAB — BASIC METABOLIC PANEL
GFR calc non Af Amer: >90 mL/min (ref >90)
Glucose, Bld: 99 mg/dL (ref 70–99)
Potassium: 3.8 mEq/L (ref 3.5–5.1)
Sodium: 138 mEq/L (ref 135–145)

## 2013-02-16 LAB — PROTIME-INR
INR: 1.09 (ref 0.00–1.49)
Prothrombin Time: 14 seconds (ref 11.6–15.2)

## 2013-02-16 MED ORDER — METOPROLOL TARTRATE 25 MG PO TABS
25.0000 mg | ORAL_TABLET | Freq: Two times a day (BID) | ORAL | Status: DC
  Administered 2013-02-16 – 2013-02-24 (×17): 25 mg via ORAL
  Filled 2013-02-16 (×18): qty 1

## 2013-02-16 MED ORDER — OFF THE BEAT BOOK
Freq: Once | Status: AC
  Administered 2013-02-16: 10:00:00
  Filled 2013-02-16: qty 1

## 2013-02-16 NOTE — Progress Notes (Signed)
ANTICOAGULATION CONSULT NOTE - Follow Up Consult  Pharmacy Consult for Heparin Indication: atrial fibrillation  Allergies  Allergen Reactions  . Penicillins Other (See Comments)    unknown    Patient Measurements: Height: 5' 2.99" (160 cm) Weight: 190 lb 0.6 oz (86.2 kg) IBW/kg (Calculated) : 52.38   Vital Signs: Temp: 97.6 F (36.4 C) (06/04 0500) BP: 135/95 mmHg (06/04 1028) Pulse Rate: 115 (06/04 1028)    Recent Labs  02/15/13 1441 02/15/13 1503 02/16/13 0030 02/16/13 0430  HGB 14.4  --   --  13.1  HCT 43.2  --   --  39.2  PLT 228  --   --  184  LABPROT  --   --   --  14.0  INR  --   --   --  1.09  HEPARINUNFRC  --   --  0.66 0.60  CREATININE 0.82  --   --  0.74  TROPONINI  --  <0.30  --   --     Estimated Creatinine Clearance: 89.5 ml/min (by C-G formula based on Cr of 0.74).  Assessment: 47 year old female continuing on IV heparin for Atrial fibrillation - DCCV planned for Friday. Heparin level therapeutic at 0.60 at current rate of 1200 units/hr. Hgb 13.1, Plts 184. No issues with line and no bleeding issues per RN.   Goal of Therapy:  Heparin level 0.3-0.7 units/ml Monitor platelets by anticoagulation protocol: Yes   Plan:  1) Continue IV heparin at current rate of 1200 units/hr 2) Daily Heparin level & CBC 3) Continue to monitor signs/symptoms of bleeding 4) F/u plans for long-term anticoagulation   Benjaman Pott, PharmD, BCPS 02/16/2013   1:33 PM

## 2013-02-16 NOTE — Progress Notes (Signed)
Paged Dr. Adolm Joseph regarding patients HR. Stated to try Cardizem drip at 7.5mL/hr. Will continue to monitor.

## 2013-02-16 NOTE — Progress Notes (Signed)
ANTICOAGULATION CONSULT NOTE Pharmacy Consult for heparin Indication: atrial fibrillation  Allergies  Allergen Reactions  . Penicillins Other (See Comments)    unknown    Patient Measurements: Height: 5' 2.99" (160 cm) Weight: 190 lb 0.6 oz (86.2 kg) IBW/kg (Calculated) : 52.38 Heparin Dosing Weight: 86kg  Vital Signs: Temp: 98 F (36.7 C) (06/03 2100) Temp src: Oral (06/03 1438) BP: 126/82 mmHg (06/03 2100) Pulse Rate: 99 (06/03 2100)  Labs:  Recent Labs  02/15/13 1441 02/15/13 1503 02/16/13 0030  HGB 14.4  --   --   HCT 43.2  --   --   PLT 228  --   --   HEPARINUNFRC  --   --  0.66  CREATININE 0.82  --   --   TROPONINI  --  <0.30  --     Estimated Creatinine Clearance: 87.3 ml/min (by C-G formula based on Cr of 0.82).  Assessment: 47 year old female with PAF for heparin  Goal of Therapy:  Heparin level 0.3-0.7 units/ml Monitor platelets by anticoagulation protocol: Yes   Plan:  Continue Heparin at current rate  Follow-up am labs.  Geannie Risen, PharmD, BCPS  02/16/2013 1:10 AM

## 2013-02-16 NOTE — Progress Notes (Signed)
[facility]  Subjective: No complaints  Objective: Vital signs in last 24 hours: Temp:  [97.6 F (36.4 C)-98.4 F (36.9 C)] 97.6 F (36.4 C) (06/04 0500) Pulse Rate:  [43-152] 54 (06/04 0500) Resp:  [12-28] 18 (06/04 0500) BP: (117-149)/(77-120) 149/111 mmHg (06/04 0500) SpO2:  [97 %-100 %] 100 % (06/04 0500) Weight:  [190 lb (86.183 kg)-190 lb 0.6 oz (86.2 kg)] 190 lb 0.6 oz (86.2 kg) (06/03 1615)    Intake/Output from previous day:   Intake/Output this shift:    Medications Current Facility-Administered Medications  Medication Dose Route Frequency Provider Last Rate Last Dose  . aspirin EC tablet 81 mg  81 mg Oral Daily Wilburt Finlay, PA-C   81 mg at 02/16/13 0941  . diltiazem (CARDIZEM) 100 mg in dextrose 5 % 100 mL infusion  5-15 mg/hr Intravenous Titrated Glynn Octave, MD 10 mL/hr at 02/16/13 0943 10 mg/hr at 02/16/13 0943  . heparin ADULT infusion 100 units/mL (25000 units/250 mL)  1,200 Units/hr Intravenous Continuous Severiano Gilbert, [facility] 12 mL/hr at 02/16/13 0941 1,200 Units/hr at 02/16/13 0941  . hydrALAZINE (APRESOLINE) tablet 25 mg  25 mg Oral TID Wilburt Finlay, PA-C   25 mg at 02/16/13 0941  . sodium chloride 0.9 % injection 3 mL  3 mL Intravenous Q12H Wilburt Finlay, PA-C        PE: General appearance: alert, cooperative and no distress Lungs: clear to auscultation bilaterally Heart: irregularly irregular rhythm Pulses: 2+ and symmetric Skin: Skin color, texture, turgor normal. No rashes or lesions Neuro:  Grossly normal Lab Results:   Recent Labs  02/15/13 1441 02/16/13 0430  WBC 10.6* 10.2  HGB 14.4 13.1  HCT 43.2 39.2  PLT 228 184   BMET  Recent Labs  02/15/13 1441 02/15/13 1637 02/16/13 0430  NA 135  --  138  K >7.5* 4.2 3.8  CL 103  --  108  CO2 23  --  23  GLUCOSE 94  --  99  BUN 15  --  12  CREATININE 0.82  --  0.74  CALCIUM 9.6  --  9.0   PT/INR  Recent Labs  02/16/13 0430  LABPROT 14.0  INR  1.09   Cardiac Panel (last 3 results)  Recent Labs  02/15/13 1503  TROPONINI <0.30      Assessment/Plan  Principal Problem:   Atrial flutter with rapid ventricular response Active Problems:   Hx-TIA (transient ischemic attack) 02/05/13   HTN (hypertension)  Plan:  TEE/DCCV on Friday.  HR with better control today.  Adding back lopressor at 25mg  bid.    LOS: 1 day    HAGER, BRYAN 02/16/2013 9:53 AM  TEE/DCCV will be on Friday at 1200hrs.  HAGER, BRYAN 10:15 AM  Agree with note written by Jones Skene PAC  Continued Aflutter with a slower VR on IV dilt and hep. Plan TEE DCCV with Dr. Salena Saner on Friday. Will need NOAC. Or coumadin AC. Exam benign.  Runell Gess 02/16/2013 1:34 PM

## 2013-02-17 LAB — CBC
Hemoglobin: 13.9 g/dL (ref 12.0–15.0)
MCHC: 33.4 g/dL (ref 30.0–36.0)
RDW: 14.5 % (ref 11.5–15.5)

## 2013-02-17 LAB — HEPARIN LEVEL (UNFRACTIONATED): Heparin Unfractionated: 0.57 IU/mL (ref 0.30–0.70)

## 2013-02-17 NOTE — Progress Notes (Signed)
[facility]  Subjective: Currently asymptomatic. Denies palpitations, chest pain, SOB, lightheadedness, dizziness.   Objective: Vital signs in last 24 hours: Temp:  [97.7 F (36.5 C)-98.2 F (36.8 C)] 98.2 F (36.8 C) (06/05 0457) Pulse Rate:  [70-135] 135 (06/05 0457) Resp:  [16-18] 18 (06/05 0457) BP: (124-148)/(76-125) 144/82 mmHg (06/05 0500) SpO2:  [99 %-100 %] 100 % (06/05 0457) Last BM Date: 02/16/13  Intake/Output from previous day: 06/04 0701 - 06/05 0700 In: 1120 [P.O.:1120] Out: -  Intake/Output this shift: Total I/O In: 360 [P.O.:360] Out: -   Medications Current Facility-Administered Medications  Medication Dose Route Frequency Provider Last Rate Last Dose  . aspirin EC tablet 81 mg  81 mg Oral Daily Wilburt Finlay, PA-C   81 mg at 02/16/13 0941  . diltiazem (CARDIZEM) 100 mg in dextrose 5 % 100 mL infusion  5-15 mg/hr Intravenous Titrated Glynn Octave, MD 10 mL/hr at 02/16/13 0943 10 mg/hr at 02/16/13 0943  . heparin ADULT infusion 100 units/mL (25000 units/250 mL)  1,200 Units/hr Intravenous Continuous Severiano Gilbert, [facility] 12 mL/hr at 02/17/13 0832 1,200 Units/hr at 02/17/13 1610  . hydrALAZINE (APRESOLINE) tablet 25 mg  25 mg Oral TID Wilburt Finlay, PA-C   25 mg at 02/16/13 2149  . metoprolol tartrate (LOPRESSOR) tablet 25 mg  25 mg Oral BID Wilburt Finlay, PA-C   25 mg at 02/16/13 2149  . sodium chloride 0.9 % injection 3 mL  3 mL Intravenous Q12H Wilburt Finlay, PA-C        PE: General appearance: alert, cooperative and no distress Lungs: clear to auscultation bilaterally Heart: regularly irregular rhythm Extremities: no LEE Pulses: 2+ and symmetric Skin: warm and dry Neurologic: Grossly normal  Lab Results:   Recent Labs  02/15/13 1441 02/16/13 0430 02/17/13 0500  WBC 10.6* 10.2 10.4  HGB 14.4 13.1 13.9  HCT 43.2 39.2 41.6  PLT 228 184 193   BMET  Recent Labs  02/15/13 1441 02/15/13 1637 02/16/13 0430  NA  135  --  138  K >7.5* 4.2 3.8  CL 103  --  108  CO2 23  --  23  GLUCOSE 94  --  99  BUN 15  --  12  CREATININE 0.82  --  0.74  CALCIUM 9.6  --  9.0   PT/INR  Recent Labs  02/16/13 0430  LABPROT 14.0  INR 1.09    Cardiac Panel (last 3 results)  Recent Labs  02/15/13 1503  TROPONINI <0.30   Assessment/Plan  Principal Problem:   Atrial flutter with rapid ventricular response Active Problems:   Hx-TIA (transient ischemic attack) 02/05/13   HTN (hypertension)  Plan: Pt continues in atrial flutter on telemetry. Ventricular rate is decently controlled in the 90s. She is currently asymptomatic. BP is stable. Continue on IV Cardizem and PO Lopressor for rate control and IV heparin for AC. Plan for TEE/DCCV with Dr. [name] tomorrow, if she does not spontaneously convert to NSR. She is tentatively scheduled for 1200. Will continue to monitor. Pt will need long term oral AC once discharged. Warfarin vs NOAC. Will check with CM to see if pt can get insurance coverage for a NOAC.       LOS: 2 days    Brittainy M. Delmer Islam 02/17/2013 9:55 AM     Patient seen and examined. Agree with assessment and plan. Feels well. Currently in A flutter with 4:1 block, ventricular rate ~70. If does not convert pharmacologically, then TEE  cardioversion tomorrow. Will keep NPO in am.   Lennette Bihari, MD, [facility] 02/17/2013 2:38 PM

## 2013-02-17 NOTE — Progress Notes (Signed)
ANTICOAGULATION CONSULT NOTE - Follow Up Consult  Pharmacy Consult for Heparin Indication: atrial fibrillation  Allergies  Allergen Reactions  . Penicillins Other (See Comments)    unknown   Patient Measurements: Height: 5' 2.99" (160 cm) Weight: 190 lb 0.6 oz (86.2 kg) IBW/kg (Calculated) : 52.38  Vital Signs: Temp: 98.2 F (36.8 C) (06/05 0457) BP: 132/81 mmHg (06/05 1008) Pulse Rate: 118 (06/05 1008)   Recent Labs  02/15/13 1441 02/15/13 1503 02/16/13 0030 02/16/13 0430 02/17/13 0500  HGB 14.4  --   --  13.1 13.9  HCT 43.2  --   --  39.2 41.6  PLT 228  --   --  184 193  LABPROT  --   --   --  14.0  --   INR  --   --   --  1.09  --   HEPARINUNFRC  --   --  0.66 0.60 0.57  CREATININE 0.82  --   --  0.74  --   TROPONINI  --  <0.30  --   --   --    Estimated Creatinine Clearance: 89.5 ml/min (by C-G formula based on Cr of 0.74).  Assessment: 47 year old female continuing on IV heparin for Atrial fibrillation - DCCV planned for Friday. Heparin level therapeutic at 0.60 at current rate of 1200 units/hr. Hgb 13.9, Plts 193.  No issues with line and no bleeding issues per RN.  Goal of Therapy:  Heparin level 0.3-0.7 units/ml Monitor platelets by anticoagulation protocol: Yes   Plan:  1) Continue IV heparin at current rate of 1200 units/hr 2) Daily Heparin level & CBC 3) Continue to monitor signs/symptoms of bleeding 4) F/u plans for long-term anticoagulation  Nadara Mustard, PharmD., MS Clinical Pharmacist Pager:  [phone] Thank you for allowing pharmacy to be part of this patients care team. 02/17/2013   11:10 AM

## 2013-02-18 ENCOUNTER — Inpatient Hospital Stay (HOSPITAL_COMMUNITY): Payer: MEDICAID | Admitting: Certified Registered Nurse Anesthetist

## 2013-02-18 ENCOUNTER — Encounter (HOSPITAL_COMMUNITY): Payer: Self-pay | Admitting: Certified Registered Nurse Anesthetist

## 2013-02-18 ENCOUNTER — Encounter (HOSPITAL_COMMUNITY)
Admission: EM | Disposition: A | Discharge status: Home / Self Care | Payer: Self-pay | Source: Home / Self Care | Attending: Internal Medicine

## 2013-02-18 ENCOUNTER — Encounter (HOSPITAL_COMMUNITY): Payer: Self-pay

## 2013-02-18 HISTORY — PX: CARDIOVERSION: SHX1299

## 2013-02-18 HISTORY — PX: TEE WITHOUT CARDIOVERSION: SHX5443

## 2013-02-18 LAB — CBC
HCT: 42.6 % (ref 36.0–46.0)
Platelets: 199 10*3/uL (ref 150–400)
RDW: 14.3 % (ref 11.5–15.5)
WBC: 11.8 10*3/uL — ABNORMAL HIGH (ref 4.0–10.5)

## 2013-02-18 LAB — BASIC METABOLIC PANEL
BUN: 10 mg/dL (ref 6–23)
Calcium: 9.8 mg/dL (ref 8.4–10.5)
GFR calc non Af Amer: 74 mL/min — ABNORMAL LOW (ref >90)
Glucose, Bld: 116 mg/dL — ABNORMAL HIGH (ref 70–99)
Potassium: 4.4 mEq/L (ref 3.5–5.1)

## 2013-02-18 SURGERY — ECHOCARDIOGRAM, TRANSESOPHAGEAL
Anesthesia: General

## 2013-02-18 MED ORDER — PROPOFOL 10 MG/ML IV BOLUS
INTRAVENOUS | Status: DC | PRN
  Administered 2013-02-18: 30 mg via INTRAVENOUS

## 2013-02-18 MED ORDER — FLECAINIDE ACETATE 50 MG PO TABS
50.0000 mg | ORAL_TABLET | Freq: Two times a day (BID) | ORAL | Status: DC
  Administered 2013-02-18 – 2013-02-24 (×13): 50 mg via ORAL
  Filled 2013-02-18 (×15): qty 1

## 2013-02-18 MED ORDER — MIDAZOLAM HCL 10 MG/2ML IJ SOLN
INTRAMUSCULAR | Status: DC | PRN
  Administered 2013-02-18: 1 mg via INTRAVENOUS
  Administered 2013-02-18: 2 mg via INTRAVENOUS
  Administered 2013-02-18: 1 mg via INTRAVENOUS

## 2013-02-18 MED ORDER — FENTANYL CITRATE 0.05 MG/ML IJ SOLN
INTRAMUSCULAR | Status: AC
  Filled 2013-02-18: qty 4

## 2013-02-18 MED ORDER — SODIUM CHLORIDE 0.9 % IV SOLN: INTRAVENOUS | Status: DC

## 2013-02-18 MED ORDER — FENTANYL CITRATE 0.05 MG/ML IJ SOLN
INTRAMUSCULAR | Status: DC | PRN
  Administered 2013-02-18: 25 ug via INTRAVENOUS

## 2013-02-18 MED ORDER — SODIUM CHLORIDE 0.9 % IV SOLN
INTRAVENOUS | Status: DC
  Administered 2013-02-18: 13:00:00 via INTRAVENOUS

## 2013-02-18 MED ORDER — BUTAMBEN-TETRACAINE-BENZOCAINE 2-2-14 % EX AERO
INHALATION_SPRAY | CUTANEOUS | Status: DC | PRN
  Administered 2013-02-18: 2 via TOPICAL

## 2013-02-18 MED ORDER — MIDAZOLAM HCL 5 MG/ML IJ SOLN
INTRAMUSCULAR | Status: AC
  Filled 2013-02-18: qty 2

## 2013-02-18 NOTE — Care Management Note (Signed)
    Page 1 of 2   02/24/2013     10:44:05 AM   CARE MANAGEMENT NOTE 02/24/2013  Patient:  [name], [name]   Account Number:  [id number]  Date Initiated:  02/18/2013  Documentation initiated by:  GRAVES-BIGELOW,Evaline Waltman  Subjective/Objective Assessment:   Pt  admitted with aflutter with RVR. Placed on cardizem gtt.  Post cardioversion today. Failed cardioversion - immediate return of atrial fibrillation within 1 minute of cardioversion. Pt has no insurance.     Action/Plan:   Per MD Note: Will require antiarrhythmic therapy to maintain NSR.Consider EP evaluation for ablation. If pt to go home on Eliquis 30 day card can be given to pt at d/c along with pt assistance form. For xarelto 10 day free card/ forms.   Anticipated DC Date:  02/21/2013   Anticipated DC Plan:  HOME/SELF CARE      DC Planning Services  CM consult      Choice offered to / List presented to:             Status of service:  Completed, signed off Medicare Important Message given?   (If response is "NO", the following Medicare IM given date fields will be blank) Date Medicare IM given:   Date Additional Medicare IM given:    Discharge Disposition:  HOME/SELF CARE  Per UR Regulation:  Reviewed for med. necessity/level of care/duration of stay  If discussed at Long Length of Stay Meetings, dates discussed:   02/22/2013  02/24/2013    Comments:  02-24-13 [address], [place] [phone] CM provided pt with the # to the [facility] and [facility] on [address]. Pt is aware of pricing of medication and she states she will be able to afford. Please see previous note about flecainide pricing. Cardizem price will be 22.38. No further needs from Cm at this time.   02-23-13 [address], [place] [phone] CM did a benefits check for flecainide and cost will be 43.88 for 50 mg dosage 60 total, if you get a 30 day supply of 100mg  flecainide and score the pill the cost will be  28.47 from [employer]. CM will continue to monitor.   02-21-13 1438 Tomi Bamberger, RN,BSN [phone] CM did speak to PA this am in reference to medications. Pt will be d/c on coumadin. Pt does go to the [facility] and [facility] on [place]. She will need a f/u appointment for PT/INR checks if will not be done in the Cardiologists office. CM will continue to monitor.  02-18-13 [address] Tomi Bamberger, RN,BSN [phone] CM did leave patient assist forms in the shadow chart. Please fill out the one you would like pt to be d/c home on if any. Weekend CM will be here to assist if any needs occur over the weekend.

## 2013-02-18 NOTE — Transfer of Care (Signed)
Immediate Anesthesia Transfer of Care Note  Patient: [name]  Procedure(s) Performed: Procedure(s): TRANSESOPHAGEAL ECHOCARDIOGRAM (TEE) (N/A) CARDIOVERSION (N/A)  Patient Location: Endoscopy Unit  Anesthesia Type:General  Level of Consciousness: awake, alert , oriented and patient cooperative  Airway & Oxygen Therapy: Patient Spontanous Breathing and Patient connected to nasal cannula oxygen  Post-op Assessment: Report given to PACU RN, Post -op Vital signs reviewed and stable and Patient moving all extremities X 4  Post vital signs: Reviewed and stable  Complications: No apparent anesthesia complications

## 2013-02-18 NOTE — Progress Notes (Signed)
  Echocardiogram Echocardiogram Transesophageal has been performed.  Georgian Co 02/18/2013, 2:15 PM

## 2013-02-18 NOTE — Progress Notes (Signed)
[facility]  DAILY PROGRESS NOTE   Subjective:  Feels well, but still in atrial flutter with controlled response.  Objective:  Temp:  [97.4 F (36.3 C)-98.2 F (36.8 C)] 98.2 F (36.8 C) (06/06 1117) Pulse Rate:  [68-103] 103 (06/06 0957) Resp:  [11-22] 11 (06/06 1215) BP: (115-152)/(79-100) 152/100 mmHg (06/06 1215) SpO2:  [94 %-100 %] 100 % (06/06 1215) Weight change:   Intake/Output from previous day: 06/05 0701 - 06/06 0700 In: 1114 [P.O.:960; I.V.:154] Out: -   Intake/Output from this shift:    Medications: Current Facility-Administered Medications  Medication Dose Route Frequency Provider Last Rate Last Dose  . 0.9 %  sodium chloride infusion   Intravenous Continuous Nada Boozer, NP      . 0.9 %  sodium chloride infusion   Intravenous Continuous Nada Boozer, NP      . [facility] HOLD] aspirin EC tablet 81 mg  81 mg Oral Daily Wilburt Finlay, PA-C   81 mg at 02/18/13 0957  . [facility] HOLD] diltiazem (CARDIZEM) 100 mg in dextrose 5 % 100 mL infusion  5-15 mg/hr Intravenous Titrated Glynn Octave, MD 10 mL/hr at 02/18/13 0546 10 mg/hr at 02/18/13 0546  . heparin ADULT infusion 100 units/mL (25000 units/250 mL)  1,200 Units/hr Intravenous Continuous Severiano Gilbert, [facility] 12 mL/hr at 02/18/13 0546 1,200 Units/hr at 02/18/13 0546  . [facility] HOLD] hydrALAZINE (APRESOLINE) tablet 25 mg  25 mg Oral TID Wilburt Finlay, PA-C   25 mg at 02/18/13 0957  . [facility] HOLD] metoprolol tartrate (LOPRESSOR) tablet 25 mg  25 mg Oral BID Wilburt Finlay, PA-C   25 mg at 02/18/13 0957  . [MAR HOLD] sodium chloride 0.9 % injection 3 mL  3 mL Intravenous Q12H Wilburt Finlay, PA-C        Physical Exam: General appearance: alert, cooperative and no distress Neck: no adenopathy, no carotid bruit, no JVD, supple, symmetrical, trachea midline and thyroid not enlarged, symmetric, no tenderness/mass/nodules Lungs: clear to auscultation bilaterally Heart: irregularly irregular rhythm, S1, S2  normal, no click and no rub Abdomen: soft, non-tender; bowel sounds normal; no masses,  no organomegaly Extremities: extremities normal, atraumatic, no cyanosis or edema Pulses: 2+ and symmetric Skin: Skin color, texture, turgor normal. No rashes or lesions Neurologic: Grossly normal  Lab Results: Results for orders placed during the hospital encounter of 02/15/13 (from the past 48 hour(s))  CBC     Status: None   Collection Time    02/17/13  5:00 AM      Result Value Range   WBC 10.4  4.0 - 10.5 K/uL   RBC 5.02  3.87 - 5.11 MIL/uL   Hemoglobin 13.9  12.0 - 15.0 g/dL   HCT 84.1  32.4 - 40.1 %   MCV 82.9  78.0 - 100.0 fL   MCH 27.7  26.0 - 34.0 pg   MCHC 33.4  30.0 - 36.0 g/dL   RDW 02.7  25.3 - 66.4 %   Platelets 193  150 - 400 K/uL  HEPARIN LEVEL (UNFRACTIONATED)     Status: None   Collection Time    02/17/13  5:00 AM      Result Value Range   Heparin Unfractionated 0.57  0.30 - 0.70 IU/mL   Comment:            IF HEPARIN RESULTS ARE BELOW     EXPECTED VALUES, AND PATIENT     DOSAGE HAS BEEN CONFIRMED,     SUGGEST FOLLOW UP  TESTING     OF ANTITHROMBIN III LEVELS.  CBC     Status: Abnormal   Collection Time    02/18/13  4:50 AM      Result Value Range   WBC 11.8 (*) 4.0 - 10.5 K/uL   RBC 5.14 (*) 3.87 - 5.11 MIL/uL   Hemoglobin 14.3  12.0 - 15.0 g/dL   HCT 16.1  09.6 - 04.5 %   MCV 82.9  78.0 - 100.0 fL   MCH 27.8  26.0 - 34.0 pg   MCHC 33.6  30.0 - 36.0 g/dL   RDW 40.9  81.1 - 91.4 %   Platelets 199  150 - 400 K/uL  HEPARIN LEVEL (UNFRACTIONATED)     Status: None   Collection Time    02/18/13  4:50 AM      Result Value Range   Heparin Unfractionated 0.45  0.30 - 0.70 IU/mL   Comment:            IF HEPARIN RESULTS ARE BELOW     EXPECTED VALUES, AND PATIENT     DOSAGE HAS BEEN CONFIRMED,     SUGGEST FOLLOW UP TESTING     OF ANTITHROMBIN III LEVELS.    Imaging: No results found.  Assessment:  1. Principal Problem: 2.   Atrial flutter with rapid  ventricular response 3. Active Problems: 4.   Hx-TIA (transient ischemic attack) 02/05/13 5.   HTN (hypertension) 6.   Plan:  1. Proceed with TEE and DC cardioversion as planned. This procedure has been fully reviewed with the patient and written informed consent has been obtained.   Time Spent Directly with Patient:  30 minutes  Length of Stay:  LOS: 3 days    Lavora Brisbon 02/18/2013, 12:26 PM

## 2013-02-18 NOTE — Addendum Note (Signed)
Addendum created 02/18/13 1400 by Rogelia Boga, CRNA   Modules edited: Anesthesia Device Management

## 2013-02-18 NOTE — Anesthesia Preprocedure Evaluation (Addendum)
Anesthesia Evaluation  Patient identified by MRN, date of birth, ID band Patient awake    Reviewed: Allergy & Precautions, H&P , NPO status , Patient's Chart, lab work & pertinent test results  Airway Mallampati: II TM Distance: >3 FB Neck ROM: Full    Dental  (+) Dental Advisory Given   Pulmonary pneumonia -, resolved, Current Smoker,          Cardiovascular hypertension, Pt. on medications and Pt. on home beta blockers + dysrhythmias Atrial Fibrillation     Neuro/Psych TIA   GI/Hepatic   Endo/Other    Renal/GU      Musculoskeletal   Abdominal   Peds  Hematology   Anesthesia Other Findings   Reproductive/Obstetrics                         Anesthesia Physical Anesthesia Plan  ASA: III  Anesthesia Plan: General   Post-op Pain Management:    Induction: Intravenous  Airway Management Planned: Mask  Additional Equipment:   Intra-op Plan:   Post-operative Plan:   Informed Consent: I have reviewed the patients History and Physical, chart, labs and discussed the procedure including the risks, benefits and alternatives for the proposed anesthesia with the patient or authorized representative who has indicated his/her understanding and acceptance.     Plan Discussed with: CRNA, Anesthesiologist and Surgeon  Anesthesia Plan Comments:         Anesthesia Quick Evaluation

## 2013-02-18 NOTE — Preoperative (Signed)
Beta Blockers   Reason not to administer Beta Blockers:Not Applicable, Pt took Metoprolol at 10 am this morning

## 2013-02-18 NOTE — Progress Notes (Signed)
Failed cardioversion - immediate return of atrial fibrillation within 1 minute of cardioversion. Will require antiarrhythmic therapy to maintain NSR. Consider EP evaluation for ablation. Needs a sleep study if never evaluated in the past - right atrial enlargement  Is most prominent structural abnormality and had airway obstruction during sedation.  [name], MD, [facility] [phone] office [phone] pager

## 2013-02-18 NOTE — Op Note (Signed)
Procedure: Electrical Cardioversion Indications:  Atrial Flutter  Procedure Details:  Consent: Risks of procedure as well as the alternatives and risks of each were explained to the (patient/caregiver).  Consent for procedure obtained.  Time Out: Verified patient identification, verified procedure, site/side was marked, verified correct patient position, special equipment/implants available, medications/allergies/relevent history reviewed, required imaging and test results available.  Performed  Patient placed on cardiac monitor, pulse oximetry, supplemental oxygen as necessary.  Sedation given: propofol 30 mg IV Pacer pads placed anterior and posterior chest.  Cardioverted 3 time(s).  Cardioverted at 120J, 150J, 150J synchronized biphasic. Each time converted briefly to NSR, with recurrence to atrial flutter or atrial fibrillation each time. Maintained NSR for less than 1 minute each time .  Evaluation: Findings: Post procedure EKG shows: Atrial Fibrillation Complications: None Patient did tolerate procedure well. Obstructive sleep apnea was evident during sedation.  Time Spent Directly with the Patient:  30 minutes   [name] 02/18/2013, 12:58 PM

## 2013-02-18 NOTE — Anesthesia Postprocedure Evaluation (Signed)
  Anesthesia Post-op Note  Patient: [name]  Procedure(s) Performed: Procedure(s): TRANSESOPHAGEAL ECHOCARDIOGRAM (TEE) (N/A) CARDIOVERSION (N/A)  Patient Location: PACU and Endoscopy Unit  Anesthesia Type:MAC  Level of Consciousness: awake  Airway and Oxygen Therapy: Patient Spontanous Breathing  Post-op Pain: mild  Post-op Assessment: Post-op Vital signs reviewed  Post-op Vital Signs: Reviewed  Complications: No apparent anesthesia complications

## 2013-02-18 NOTE — Op Note (Signed)
INDICATIONS: atrial flutter precardioversion  PROCEDURE:   Informed consent was obtained prior to the procedure. The risks, benefits and alternatives for the procedure were discussed and the patient comprehended these risks.  Risks include, but are not limited to, cough, sore throat, vomiting, nausea, somnolence, esophageal and stomach trauma or perforation, bleeding, low blood pressure, aspiration, pneumonia, infection, trauma to the teeth and death.    After a procedural time-out, the oropharynx was anesthetized with 20% benzocaine spray. The patient was given 4 mg versed and 25 mcg fentanyl for moderate sedation.   The transesophageal probe was inserted in the esophagus and stomach without difficulty and multiple views were obtained.  The patient was kept under observation until the patient left the procedure room.  The patient left the procedure room in stable condition.   Agitated microbubble saline contrast was administered.  COMPLICATIONS:   There were no immediate complications.  FINDINGS:  Dilated right atrium Left atrium normal in size. No LA thrombus was identified. Low appendage emptying velocities. LVEF 45%, mild global hypokinesis 2+ AI, trivial MR, 2+ TR, unable to get good TR jet alignment for PA pressure estimation.  RECOMMENDATIONS:   Proceed with cardioversion.  Will require uninterrupted therapeutic anticoagulation. She has taken warfarin in the past and prefers to NOAC. Options are outpatient enoxaparin or inpatient IV heparin.  Time Spent Directly with the Patient:  30 minutes   [name] 02/18/2013, 12:31 PM

## 2013-02-18 NOTE — Progress Notes (Signed)
ANTICOAGULATION CONSULT NOTE - Follow Up Consult  Pharmacy Consult for Heparin Indication: atrial fibrillation  Allergies  Allergen Reactions  . Penicillins Other (See Comments)    unknown    Patient Measurements: Height: 5' 2.99" (160 cm) Weight: 190 lb 0.6 oz (86.2 kg) IBW/kg (Calculated) : 52.38 Heparin Dosing Weight: ~ 71.5kg  Vital Signs: Temp: 97.4 F (36.3 C) (06/06 0519) Temp src: Oral (06/06 0519) BP: 138/79 mmHg (06/06 0519) Pulse Rate: 92 (06/06 0519)  Labs:  Recent Labs  02/15/13 1441 02/15/13 1503  02/16/13 0430 02/17/13 0500 02/18/13 0450  HGB 14.4  --   --  13.1 13.9 14.3  HCT 43.2  --   --  39.2 41.6 42.6  PLT 228  --   --  184 193 199  LABPROT  --   --   --  14.0  --   --   INR  --   --   --  1.09  --   --   HEPARINUNFRC  --   --   < > 0.60 0.57 0.45  CREATININE 0.82  --   --  0.74  --   --   TROPONINI  --  <0.30  --   --   --   --   < > = values in this interval not displayed.  Estimated Creatinine Clearance: 89.5 ml/min (by C-G formula based on Cr of 0.74).   Medications:  Heparin @ 1200 units/hr  Assessment: 46yof continues on heparin for afib/flutter with a therapeutic heparin level.  CBC stable - platelets improving. No bleeding reported. For DCCV today.  Goal of Therapy:  Heparin level 0.3-0.7 units/ml Monitor platelets by anticoagulation protocol: Yes   Plan:  1) Continue heparin @ 1200 units/hr 2) Follow up after DCCV for oral anticoagulation  Fredrik Rigger 02/18/2013,8:14 AM

## 2013-02-19 DIAGNOSIS — I4891 Unspecified atrial fibrillation: Secondary | ICD-10-CM

## 2013-02-19 LAB — CBC
Hemoglobin: 13.3 g/dL (ref 12.0–15.0)
MCH: 27.7 pg (ref 26.0–34.0)
MCHC: 33.3 g/dL (ref 30.0–36.0)

## 2013-02-19 LAB — GLUCOSE, CAPILLARY
Glucose-Capillary: 104 mg/dL — ABNORMAL HIGH (ref 70–99)
Glucose-Capillary: 97 mg/dL (ref 70–99)
Glucose-Capillary: 93 mg/dL (ref 70–99)

## 2013-02-19 LAB — HEPARIN LEVEL (UNFRACTIONATED): Heparin Unfractionated: 0.5 IU/mL (ref 0.30–0.70)

## 2013-02-19 MED ORDER — DILTIAZEM HCL ER COATED BEADS 240 MG PO CP24
240.0000 mg | ORAL_CAPSULE | Freq: Every day | ORAL | Status: DC
  Administered 2013-02-19 – 2013-02-22 (×4): 240 mg via ORAL
  Filled 2013-02-19 (×5): qty 1

## 2013-02-19 MED ORDER — COUMADIN BOOK
Freq: Once | Status: AC
  Administered 2013-02-19: 17:00:00
  Filled 2013-02-19: qty 1

## 2013-02-19 MED ORDER — WARFARIN SODIUM 10 MG PO TABS
10.0000 mg | ORAL_TABLET | Freq: Once | ORAL | Status: AC
  Administered 2013-02-19: 10 mg via ORAL
  Filled 2013-02-19: qty 1

## 2013-02-19 MED ORDER — WARFARIN VIDEO
Freq: Once | Status: AC
  Administered 2013-02-19: 09:00:00

## 2013-02-19 MED ORDER — WARFARIN - PHARMACIST DOSING INPATIENT
Freq: Every day | Status: DC
  Administered 2013-02-19 – 2013-02-20 (×2)

## 2013-02-19 NOTE — Progress Notes (Addendum)
ANTICOAGULATION CONSULT NOTE - Follow Up Consult  Pharmacy Consult for Heparin + Coumadin Indication: atrial fibrillation  Allergies  Allergen Reactions  . Penicillins Other (See Comments)    unknown    Patient Measurements: Height: 5' 2.99" (160 cm) Weight: 190 lb 0.6 oz (86.2 kg) IBW/kg (Calculated) : 52.38 Heparin Dosing Weight: ~ 71.5kg  Vital Signs: Temp: 98 F (36.7 C) (06/07 0604) Temp src: Oral (06/07 0604) BP: 147/86 mmHg (06/07 0604) Pulse Rate: 58 (06/07 0604)  Labs:  Recent Labs  02/17/13 0500 02/18/13 0450 02/18/13 1440 02/19/13 0515  HGB 13.9 14.3  --  13.3  HCT 41.6 42.6  --  39.9  PLT 193 199  --  205  HEPARINUNFRC 0.57 0.45  --  0.50  CREATININE  --   --  0.90  --     Estimated Creatinine Clearance: 79.5 ml/min (by C-G formula based on Cr of 0.9).   Medications:  Heparin @ 1200 units/hr  Assessment: 46yof continues on heparin for afib/flutter with a therapeutic heparin level.  CBC stable - platelets improving. No bleeding reported. Failed DCCV yesterday and now on flecainide.  Goal of Therapy:  Heparin level 0.3-0.7 units/ml Monitor platelets by anticoagulation protocol: Yes   Plan:  1) Continue heparin @ 1200 units/hr 2) Follow up heparin level, CBC in AM 3) Follow up plan for oral anticoagulation  Fredrik Rigger 02/19/2013,7:24 AM  Have now received orders to begin coumadin. Baseline INR 1.09. Coumadin score = 8.   Plan: 1) Coumadin 10mg  x 1 tonight 2) Daily INR 3) Coumadin education - book/video  Fredrik Rigger 02/19/2013, 8:48 AM

## 2013-02-19 NOTE — Progress Notes (Signed)
[facility]  DAILY PROGRESS NOTE   Subjective:  Feels well today.  Back in atrial flutter with controlled ventricular rate at about 70 beats per minute. She is oblivious to the arrhythmia and denies for posterior breath chest pain or dizziness. She does not have any discomfort at cardioversion pad sites.  Objective:  Temp:  [97.5 F (36.4 C)-98.2 F (36.8 C)] 98 F (36.7 C) (06/07 0604) Pulse Rate:  [47-107] 58 (06/07 0604) Resp:  [9-35] 18 (06/07 0604) BP: (100-167)/(61-100) 147/86 mmHg (06/07 0604) SpO2:  [91 %-100 %] 100 % (06/07 0604) Weight change:   Intake/Output from previous day: 06/06 0701 - 06/07 0700 In: 880 [P.O.:480; I.V.:400] Out: -   Intake/Output from this shift:    Medications: Current Facility-Administered Medications  Medication Dose Route Frequency Provider Last Rate Last Dose  . aspirin EC tablet 81 mg  81 mg Oral Daily Wilburt Finlay, PA-C   81 mg at 02/18/13 0957  . coumadin book   Does not apply Once Fredrik Rigger, RPH      . diltiazem (CARDIZEM CD) 24 hr capsule 240 mg  240 mg Oral Daily Huberta Tompkins, MD      . flecainide (TAMBOCOR) tablet 50 mg  50 mg Oral Q12H Markevion Lattin, MD   50 mg at 02/18/13 2152  . heparin ADULT infusion 100 units/mL (25000 units/250 mL)  1,200 Units/hr Intravenous Continuous Severiano Gilbert, [facility] 12 mL/hr at 02/19/13 0110 1,200 Units/hr at 02/19/13 0110  . hydrALAZINE (APRESOLINE) tablet 25 mg  25 mg Oral TID Wilburt Finlay, PA-C   25 mg at 02/18/13 2151  . metoprolol tartrate (LOPRESSOR) tablet 25 mg  25 mg Oral BID Wilburt Finlay, PA-C   25 mg at 02/18/13 2152  . sodium chloride 0.9 % injection 3 mL  3 mL Intravenous Q12H Wilburt Finlay, PA-C      . warfarin (COUMADIN) tablet 10 mg  10 mg Oral ONCE-1800 Hessie Diener [place], [facility]      . warfarin (COUMADIN) video   Does not apply Once Fredrik Rigger, [facility]      . Warfarin - Pharmacist Dosing Inpatient   Does not apply q1800 Fredrik Rigger,  [facility]        Physical Exam: General appearance: alert, cooperative and no distress  Neck: no adenopathy, no carotid bruit, no JVD, supple, symmetrical, trachea midline and thyroid not enlarged, symmetric, no tenderness/mass/nodules  Lungs: clear to auscultation bilaterally  Heart: irregularly irregular rhythm, S1, S2 normal, no click and no rub  Abdomen: soft, non-tender; bowel sounds normal; no masses, no organomegaly  Extremities: extremities normal, atraumatic, no cyanosis or edema  Pulses: 2+ and symmetric  Skin: Skin color, texture, turgor normal. No rashes or lesions  Neurologic: Grossly normal   Lab Results: Results for orders placed during the hospital encounter of 02/15/13 (from the past 48 hour(s))  CBC     Status: Abnormal   Collection Time    02/18/13  4:50 AM      Result Value Range   WBC 11.8 (*) 4.0 - 10.5 K/uL   RBC 5.14 (*) 3.87 - 5.11 MIL/uL   Hemoglobin 14.3  12.0 - 15.0 g/dL   HCT 21.3  08.6 - 57.8 %   MCV 82.9  78.0 - 100.0 fL   MCH 27.8  26.0 - 34.0 pg   MCHC 33.6  30.0 - 36.0 g/dL   RDW 46.9  62.9 - 52.8 %   Platelets 199  150 -  400 K/uL  HEPARIN LEVEL (UNFRACTIONATED)     Status: None   Collection Time    02/18/13  4:50 AM      Result Value Range   Heparin Unfractionated 0.45  0.30 - 0.70 IU/mL   Comment:            IF HEPARIN RESULTS ARE BELOW     EXPECTED VALUES, AND PATIENT     DOSAGE HAS BEEN CONFIRMED,     SUGGEST FOLLOW UP TESTING     OF ANTITHROMBIN III LEVELS.  BASIC METABOLIC PANEL     Status: Abnormal   Collection Time    02/18/13  2:40 PM      Result Value Range   Sodium 140  135 - 145 mEq/L   Potassium 4.4  3.5 - 5.1 mEq/L   Chloride 104  96 - 112 mEq/L   CO2 24  19 - 32 mEq/L   Glucose, Bld 116 (*) 70 - 99 mg/dL   BUN 10  6 - 23 mg/dL   Creatinine, Ser 2.13  0.50 - 1.10 mg/dL   Calcium 9.8  8.4 - 08.6 mg/dL   GFR calc non Af Amer 74 (*) >90 mL/min   GFR calc Af Amer 86 (*) >90 mL/min   Comment:            The eGFR has been  calculated     using the CKD EPI equation.     This calculation has not been     validated in all clinical     situations.     eGFR's persistently     <90 mL/min signify     possible Chronic Kidney Disease.  GLUCOSE, CAPILLARY     Status: Abnormal   Collection Time    02/18/13  9:08 PM      Result Value Range   Glucose-Capillary 116 (*) 70 - 99 mg/dL   Comment 1 Notify RN    CBC     Status: Abnormal   Collection Time    02/19/13  5:15 AM      Result Value Range   WBC 13.1 (*) 4.0 - 10.5 K/uL   RBC 4.80  3.87 - 5.11 MIL/uL   Hemoglobin 13.3  12.0 - 15.0 g/dL   HCT 57.8  46.9 - 62.9 %   MCV 83.1  78.0 - 100.0 fL   MCH 27.7  26.0 - 34.0 pg   MCHC 33.3  30.0 - 36.0 g/dL   RDW 52.8  41.3 - 24.4 %   Platelets 205  150 - 400 K/uL  HEPARIN LEVEL (UNFRACTIONATED)     Status: None   Collection Time    02/19/13  5:15 AM      Result Value Range   Heparin Unfractionated 0.50  0.30 - 0.70 IU/mL   Comment:            IF HEPARIN RESULTS ARE BELOW     EXPECTED VALUES, AND PATIENT     DOSAGE HAS BEEN CONFIRMED,     SUGGEST FOLLOW UP TESTING     OF ANTITHROMBIN III LEVELS.  GLUCOSE, CAPILLARY     Status: Abnormal   Collection Time    02/19/13  7:51 AM      Result Value Range   Glucose-Capillary 104 (*) 70 - 99 mg/dL    Imaging: No results found.  Assessment:  1. Principal Problem: 2.   Atrial flutter with rapid ventricular response 3. Active Problems: 4.   Hx-TIA (transient ischemic attack)  02/05/13 5.   HTN (hypertension) 6.   Plan:  1. Status post failed cardioversion attempts. Antiarrhythmic flecainide therapy started. Warfarin started. Plan to observe over the weekend, while we wait for the INR to recheck therapeutic levels. Once therapeutically anticoagulated consider repeat attempt at cardioversion prior to hospital discharge. 2. Outpatient sleep study 3. Followup in Coumadin clinic. We discussed the critical importance of on interrupted anticoagulation therapy should   successful cardioversion be achieved 4. Ablation therapy appears less appealing since she clearly has both atrial flutter and atrial fibrillation. However atrial fibrillation ablation might ultimately be the best solution for this young patient  Time Spent Directly with Patient:  40 minutes  Length of Stay:  LOS: 4 days    [name] 02/19/2013, 9:12 AM

## 2013-02-20 ENCOUNTER — Encounter (HOSPITAL_COMMUNITY): Payer: Self-pay | Admitting: Cardiovascular Disease

## 2013-02-20 LAB — BASIC METABOLIC PANEL
BUN: 10 mg/dL (ref 6–23)
Creatinine, Ser: 0.9 mg/dL (ref 0.50–1.10)
GFR calc Af Amer: 86 mL/min — ABNORMAL LOW (ref >90)
GFR calc non Af Amer: 74 mL/min — ABNORMAL LOW (ref >90)
Potassium: 4.3 mEq/L (ref 3.5–5.1)

## 2013-02-20 LAB — CBC
Hemoglobin: 13.7 g/dL (ref 12.0–15.0)
MCHC: 32.3 g/dL (ref 30.0–36.0)
WBC: 12.7 10*3/uL — ABNORMAL HIGH (ref 4.0–10.5)

## 2013-02-20 LAB — PROTIME-INR
INR: 1.04 (ref 0.00–1.49)
Prothrombin Time: 13.5 seconds (ref 11.6–15.2)

## 2013-02-20 LAB — HEPARIN LEVEL (UNFRACTIONATED): Heparin Unfractionated: 0.56 IU/mL (ref 0.30–0.70)

## 2013-02-20 MED ORDER — HYDRALAZINE HCL 25 MG PO TABS
37.5000 mg | ORAL_TABLET | Freq: Three times a day (TID) | ORAL | Status: DC
  Administered 2013-02-20 – 2013-02-24 (×12): 37.5 mg via ORAL
  Filled 2013-02-20 (×15): qty 1.5

## 2013-02-20 MED ORDER — AMLODIPINE BESYLATE 5 MG PO TABS: 5.0000 mg | ORAL_TABLET | Freq: Every day | ORAL | Status: DC

## 2013-02-20 MED ORDER — WARFARIN SODIUM 10 MG PO TABS
10.0000 mg | ORAL_TABLET | Freq: Once | ORAL | Status: AC
  Administered 2013-02-20: 10 mg via ORAL
  Filled 2013-02-20: qty 1

## 2013-02-20 MED ORDER — OFF THE BEAT BOOK
Freq: Once | Status: AC
  Administered 2013-02-20: 11:00:00
  Filled 2013-02-20: qty 1

## 2013-02-20 NOTE — Progress Notes (Signed)
[facility]  DAILY PROGRESS NOTE   Subjective:  No new complaints, asymptomatic at rest. INR still 1.0. Remains in atrial flutter with mostly controlled ventricular rate.  Objective:  Temp:  [97.8 F (36.6 C)-98.1 F (36.7 C)] 97.8 F (36.6 C) (06/08 0500) Pulse Rate:  [60-77] 77 (06/08 0500) Resp:  [16-18] 16 (06/08 0500) BP: (116-158)/(82-99) 138/97 mmHg (06/08 0500) SpO2:  [95 %-100 %] 100 % (06/08 0500) Weight change:   Intake/Output from previous day: 06/07 0701 - 06/08 0700 In: 684 [P.O.:600; I.V.:84] Out: -   Intake/Output from this shift:    Medications: Current Facility-Administered Medications  Medication Dose Route Frequency Provider Last Rate Last Dose  . aspirin EC tablet 81 mg  81 mg Oral Daily Wilburt Finlay, PA-C   81 mg at 02/19/13 1017  . diltiazem (CARDIZEM CD) 24 hr capsule 240 mg  240 mg Oral Daily Caelen Reierson, MD   240 mg at 02/19/13 1021  . flecainide (TAMBOCOR) tablet 50 mg  50 mg Oral Q12H Damichael Hofman, MD   50 mg at 02/19/13 2130  . heparin ADULT infusion 100 units/mL (25000 units/250 mL)  1,200 Units/hr Intravenous Continuous Severiano Gilbert, [facility] 12 mL/hr at 02/19/13 2124 1,200 Units/hr at 02/19/13 2124  . hydrALAZINE (APRESOLINE) tablet 37.5 mg  37.5 mg Oral TID Dontavian Marchi, MD      . metoprolol tartrate (LOPRESSOR) tablet 25 mg  25 mg Oral BID Wilburt Finlay, PA-C   25 mg at 02/19/13 2131  . sodium chloride 0.9 % injection 3 mL  3 mL Intravenous Q12H Wilburt Finlay, PA-C      . warfarin (COUMADIN) tablet 10 mg  10 mg Oral ONCE-1800 Hessie Diener [place], [facility]      . Warfarin - Pharmacist Dosing Inpatient   Does not apply q1800 Fredrik Rigger, [facility]        Physical Exam: General appearance: alert, cooperative, no distress and mildly obese Neck: no adenopathy, no carotid bruit, no JVD, supple, symmetrical, trachea midline and thyroid not enlarged, symmetric, no tenderness/mass/nodules Lungs: clear to auscultation  bilaterally Heart: irregularly irregular rhythm and S1, S2 normal Abdomen: soft, non-tender; bowel sounds normal; no masses,  no organomegaly Extremities: extremities normal, atraumatic, no cyanosis or edema Pulses: 2+ and symmetric Skin: Skin color, texture, turgor normal. No rashes or lesions Neurologic: Grossly normal  Lab Results: Results for orders placed during the hospital encounter of 02/15/13 (from the past 48 hour(s))  BASIC METABOLIC PANEL     Status: Abnormal   Collection Time    02/18/13  2:40 PM      Result Value Range   Sodium 140  135 - 145 mEq/L   Potassium 4.4  3.5 - 5.1 mEq/L   Chloride 104  96 - 112 mEq/L   CO2 24  19 - 32 mEq/L   Glucose, Bld 116 (*) 70 - 99 mg/dL   BUN 10  6 - 23 mg/dL   Creatinine, Ser 1.61  0.50 - 1.10 mg/dL   Calcium 9.8  8.4 - 09.6 mg/dL   GFR calc non Af Amer 74 (*) >90 mL/min   GFR calc Af Amer 86 (*) >90 mL/min   Comment:            The eGFR has been calculated     using the CKD EPI equation.     This calculation has not been     validated in all clinical     situations.  eGFR's persistently     <90 mL/min signify     possible Chronic Kidney Disease.  GLUCOSE, CAPILLARY     Status: Abnormal   Collection Time    02/18/13  9:08 PM      Result Value Range   Glucose-Capillary 116 (*) 70 - 99 mg/dL   Comment 1 Notify RN    CBC     Status: Abnormal   Collection Time    02/19/13  5:15 AM      Result Value Range   WBC 13.1 (*) 4.0 - 10.5 K/uL   RBC 4.80  3.87 - 5.11 MIL/uL   Hemoglobin 13.3  12.0 - 15.0 g/dL   HCT 16.1  09.6 - 04.5 %   MCV 83.1  78.0 - 100.0 fL   MCH 27.7  26.0 - 34.0 pg   MCHC 33.3  30.0 - 36.0 g/dL   RDW 40.9  81.1 - 91.4 %   Platelets 205  150 - 400 K/uL  HEPARIN LEVEL (UNFRACTIONATED)     Status: None   Collection Time    02/19/13  5:15 AM      Result Value Range   Heparin Unfractionated 0.50  0.30 - 0.70 IU/mL   Comment:            IF HEPARIN RESULTS ARE BELOW     EXPECTED VALUES, AND PATIENT      DOSAGE HAS BEEN CONFIRMED,     SUGGEST FOLLOW UP TESTING     OF ANTITHROMBIN III LEVELS.  GLUCOSE, CAPILLARY     Status: Abnormal   Collection Time    02/19/13  7:51 AM      Result Value Range   Glucose-Capillary 104 (*) 70 - 99 mg/dL  GLUCOSE, CAPILLARY     Status: Abnormal   Collection Time    02/19/13 11:48 AM      Result Value Range   Glucose-Capillary 104 (*) 70 - 99 mg/dL  GLUCOSE, CAPILLARY     Status: None   Collection Time    02/19/13  4:57 PM      Result Value Range   Glucose-Capillary 97  70 - 99 mg/dL  GLUCOSE, CAPILLARY     Status: None   Collection Time    02/19/13  9:32 PM      Result Value Range   Glucose-Capillary 93  70 - 99 mg/dL  CBC     Status: Abnormal   Collection Time    02/20/13  4:00 AM      Result Value Range   WBC 12.7 (*) 4.0 - 10.5 K/uL   RBC 5.02  3.87 - 5.11 MIL/uL   Hemoglobin 13.7  12.0 - 15.0 g/dL   HCT 78.2  95.6 - 21.3 %   MCV 84.5  78.0 - 100.0 fL   MCH 27.3  26.0 - 34.0 pg   MCHC 32.3  30.0 - 36.0 g/dL   RDW 08.6  57.8 - 46.9 %   Platelets 219  150 - 400 K/uL  HEPARIN LEVEL (UNFRACTIONATED)     Status: None   Collection Time    02/20/13  4:00 AM      Result Value Range   Heparin Unfractionated 0.56  0.30 - 0.70 IU/mL   Comment:            IF HEPARIN RESULTS ARE BELOW     EXPECTED VALUES, AND PATIENT     DOSAGE HAS BEEN CONFIRMED,     SUGGEST FOLLOW UP TESTING  OF ANTITHROMBIN III LEVELS.  PROTIME-INR     Status: None   Collection Time    02/20/13  4:00 AM      Result Value Range   Prothrombin Time 13.5  11.6 - 15.2 seconds   INR 1.04  0.00 - 1.49  BASIC METABOLIC PANEL     Status: Abnormal   Collection Time    02/20/13  4:00 AM      Result Value Range   Sodium 139  135 - 145 mEq/L   Potassium 4.3  3.5 - 5.1 mEq/L   Chloride 103  96 - 112 mEq/L   CO2 26  19 - 32 mEq/L   Glucose, Bld 101 (*) 70 - 99 mg/dL   BUN 10  6 - 23 mg/dL   Creatinine, Ser 1.19  0.50 - 1.10 mg/dL   Calcium 9.6  8.4 - 14.7 mg/dL   GFR calc  non Af Amer 74 (*) >90 mL/min   GFR calc Af Amer 86 (*) >90 mL/min   Comment:            The eGFR has been calculated     using the CKD EPI equation.     This calculation has not been     validated in all clinical     situations.     eGFR's persistently     <90 mL/min signify     possible Chronic Kidney Disease.    Imaging: No results found.  Assessment:  1. Principal Problem: 2.   Atrial flutter with rapid ventricular response 3. Active Problems: 4.   Hx-TIA (transient ischemic attack) 02/05/13 5.   HTN (hypertension) 6.   Plan:  1. Continue flecainide. 2. DC when INR>2 3. Try cardioversion once more, while on flecainide, before discharge. 4. Outpatient sleep study 5. Discussed warfarin monitoring, side effects, food and drug interactions  Time Spent Directly with Patient:  30 minutes  Length of Stay:  LOS: 5 days    Tobby Fawcett 02/20/2013, 9:10 AM    '

## 2013-02-20 NOTE — Progress Notes (Signed)
ANTICOAGULATION CONSULT NOTE - Follow Up Consult  Pharmacy Consult for Heparin + Coumadin Indication: afib/flutter  Allergies  Allergen Reactions  . Penicillins Other (See Comments)    unknown    Patient Measurements: Height: 5' 2.99" (160 cm) Weight: 190 lb 0.6 oz (86.2 kg) IBW/kg (Calculated) : 52.38 Heparin Dosing Weight: ~ 71.5kg  Vital Signs: Temp: 97.8 F (36.6 C) (06/08 0500) BP: 138/97 mmHg (06/08 0500) Pulse Rate: 77 (06/08 0500)  Labs:  Recent Labs  02/18/13 0450 02/18/13 1440 02/19/13 0515 02/20/13 0400  HGB 14.3  --  13.3 13.7  HCT 42.6  --  39.9 42.4  PLT 199  --  205 219  LABPROT  --   --   --  13.5  INR  --   --   --  1.04  HEPARINUNFRC 0.45  --  0.50 0.56  CREATININE  --  0.90  --  0.90    Estimated Creatinine Clearance: 79.5 ml/min (by C-G formula based on Cr of 0.9).   Medications:  Heparin @ 1200 units/hr  Assessment: 46yof continues on heparin bridge to coumadin for afib/flutter. Heparin level is therapeutic. INR is subtherapeutic after first dose of coumadin as expected. CBC is stable. No bleeding reported.  Goal of Therapy:  Heparin level 0.3-0.7 units/ml INR 2-3 Monitor platelets by anticoagulation protocol: Yes   Plan:  1) Repeat coumadin 10mg  x 1 tonight 2) Continue heparin @ 1200 units/hr 3) Follow up INR, heparin level, CBC in AM  Fredrik Rigger 02/20/2013,8:27 AM

## 2013-02-21 LAB — CBC
MCH: 27.8 pg (ref 26.0–34.0)
MCHC: 33.6 g/dL (ref 30.0–36.0)
Platelets: 216 10*3/uL (ref 150–400)
RBC: 4.89 MIL/uL (ref 3.87–5.11)

## 2013-02-21 LAB — HEPARIN LEVEL (UNFRACTIONATED): Heparin Unfractionated: 0.54 IU/mL (ref 0.30–0.70)

## 2013-02-21 LAB — GLUCOSE, CAPILLARY: Glucose-Capillary: 111 mg/dL — ABNORMAL HIGH (ref 70–99)

## 2013-02-21 MED ORDER — WARFARIN SODIUM 10 MG PO TABS
10.0000 mg | ORAL_TABLET | Freq: Once | ORAL | Status: AC
  Administered 2013-02-21: 10 mg via ORAL
  Filled 2013-02-21: qty 1

## 2013-02-21 NOTE — Progress Notes (Signed)
ANTICOAGULATION CONSULT NOTE - Follow Up Consult  Pharmacy Consult for Heparin/Coumadin Indication: atrial fibrillation  Allergies  Allergen Reactions  . Penicillins Other (See Comments)    unknown    Patient Measurements: Height: 5' 2.99" (160 cm) Weight: 190 lb 0.6 oz (86.2 kg) IBW/kg (Calculated) : 52.38 Heparin Dosing Weight: 71.5 kg  Vital Signs: Temp: 98.1 F (36.7 C) (06/09 0500) BP: 111/76 mmHg (06/09 0952) Pulse Rate: 96 (06/09 0952)  Labs:  Recent Labs  02/18/13 1440  02/19/13 0515 02/20/13 0400 02/21/13 0450  HGB  --   < > 13.3 13.7 13.6  HCT  --   --  39.9 42.4 40.5  PLT  --   --  205 219 216  LABPROT  --   --   --  13.5 15.5*  INR  --   --   --  1.04 1.25  HEPARINUNFRC  --   --  0.50 0.56 0.54  CREATININE 0.90  --   --  0.90  --   < > = values in this interval not displayed.  Estimated Creatinine Clearance: 79.5 ml/min (by C-G formula based on Cr of 0.9).   Assessment: post TIA for follow up, found to be in Afib with rvr (150s) to start anticoagulation  Anticoagulation: Afib/flutter - Heparin/Coumadin. INR up to 1.25 after 2 doses. Heparin level 0.54 in goal range. CBC stable.  Infectious Disease: Afebrile, WBC 12.5   Cardiovascular: hx HTN , afib/flutter w/ rvr - failed DCCV on 6/6, now using flecainide to try and convert to NSR (but still in aflutter). Dr. [name] to reattempt DCCV prior to discharge. VSS Meds: ASA 81mg , Diltiazem, Flecainide, Hydralazine, metoprolol  Endocrinology: serum glucose ok  Gastrointestinal / Nutrition: n/a  Neurology: recent admission for TIA - baby aspirin  Nephrology: Scr 0.90, CrCl 79, lytes ok  Pulmonary: 100% RA  Hematology / Oncology: CBC stable  PTA Medication Issues: addressed   Goal of Therapy:  INR 2-3 Monitor platelets by anticoagulation protocol: Yes   Plan:  1) Repeat coumadin 10mg  x 1 2) Continue heparin at 1200 units/hr 3) Follow up heparin level, INR, CBC in AM     [name] S.  [name], PharmD, BCPS Clinical Staff Pharmacist Pager [phone]  Misty Stanley Stillinger 02/21/2013,11:18 AM

## 2013-02-21 NOTE — Progress Notes (Signed)
[facility]  Subjective: No complaints. She is asymptomatic and unaware of her a-fib.  Objective: Vital signs in last 24 hours: Temp:  [98 F (36.7 C)-98.1 F (36.7 C)] 98.1 F (36.7 C) (06/09 0500) Pulse Rate:  [60-96] 96 (06/09 0952) Resp:  [18-20] 18 (06/09 0500) BP: (111-135)/(76-97) 111/76 mmHg (06/09 0952) SpO2:  [98 %-100 %] 98 % (06/09 0500) Last BM Date: 02/20/13  Intake/Output from previous day: 06/08 0701 - 06/09 0700 In: 564 [P.O.:240; I.V.:324] Out: -  Intake/Output this shift:    Medications Current Facility-Administered Medications  Medication Dose Route Frequency Provider Last Rate Last Dose  . aspirin EC tablet 81 mg  81 mg Oral Daily Wilburt Finlay, PA-C   81 mg at 02/21/13 1610  . diltiazem (CARDIZEM CD) 24 hr capsule 240 mg  240 mg Oral Daily Reema Chick, MD   240 mg at 02/21/13 0952  . flecainide (TAMBOCOR) tablet 50 mg  50 mg Oral Q12H Naomie Crow, MD   50 mg at 02/21/13 0952  . heparin ADULT infusion 100 units/mL (25000 units/250 mL)  1,200 Units/hr Intravenous Continuous Severiano Gilbert, [facility] 12 mL/hr at 02/20/13 1831 1,200 Units/hr at 02/20/13 1831  . hydrALAZINE (APRESOLINE) tablet 37.5 mg  37.5 mg Oral TID [name], MD   37.5 mg at 02/21/13 0952  . metoprolol tartrate (LOPRESSOR) tablet 25 mg  25 mg Oral BID Wilburt Finlay, PA-C   25 mg at 02/21/13 9604  . sodium chloride 0.9 % injection 3 mL  3 mL Intravenous Q12H Wilburt Finlay, PA-C   3 mL at 02/21/13 1000  . Warfarin - Pharmacist Dosing Inpatient   Does not apply q1800 Fredrik Rigger, [facility]        PE: General appearance: alert, cooperative and no distress Lungs: clear to auscultation bilaterally Heart: irregularly irregular rhythm Extremities: no LEE Pulses: 2+ and symmetric Skin: warm and dry Neurologic: Grossly normal  Lab Results:   Recent Labs  02/19/13 0515 02/20/13 0400 02/21/13 0450  WBC 13.1* 12.7* 12.5*  HGB 13.3 13.7 13.6  HCT 39.9  42.4 40.5  PLT 205 219 216   BMET  Recent Labs  02/18/13 1440 02/20/13 0400  NA 140 139  K 4.4 4.3  CL 104 103  CO2 24 26  GLUCOSE 116* 101*  BUN 10 10  CREATININE 0.90 0.90  CALCIUM 9.8 9.6   PT/INR  Recent Labs  02/20/13 0400 02/21/13 0450  LABPROT 13.5 15.5*  INR 1.04 1.25    Assessment/Plan  Principal Problem:   Atrial flutter with rapid ventricular response Active Problems:   Hx-TIA (transient ischemic attack) 02/05/13   HTN (hypertension)  Plan: Pt continues in atrial flutter on telemetry. Her ventricular rate is in the 90s-low 100s. She remains asymptomatic and hemodynamically stable. She is now on Coumadin w/ heparin bridge for [facility]. INR is sub therapeutic at 1.25. INR goal is 2.0-3.0. Pharmacy is helping with dosing. Pt also on flecainide. Dr. [name] to reattempt DCCV prior to discharge.     LOS: 6 days    Brittainy M. Sharol Harness, PA-C 02/21/2013 10:56 AM  I have seen and examined the patient along with Brittainy M. Sharol Harness, PA-C.  I have reviewed the chart, notes and new data.  I agree with PA's note.  Key new complaints: No dyspnea at rest, no bleeding problem Key examination changes: Remains in atrial flutter with mostly controlled ventricular rate around 80-90 beats per minute Key new findings / data: INR still subtherapeutic  at 1.25  PLAN: Plan a second attempt at synchronized cardioversion on Wednesday or Thursday of this week once she has steady state on flecainide antiarrhythmic therapy and is at least approaching therapeutic anticoagulation levels.  [name], MD, [facility] [phone] 02/21/2013, 4:02 PM

## 2013-02-21 NOTE — Progress Notes (Signed)
UR Completed Jeremie Abdelaziz Graves-Bigelow, RN,BSN [phone]  

## 2013-02-22 LAB — CBC
Hemoglobin: 13 g/dL (ref 12.0–15.0)
MCH: 26.9 pg (ref 26.0–34.0)
MCV: 83.9 fL (ref 78.0–100.0)
RBC: 4.83 MIL/uL (ref 3.87–5.11)

## 2013-02-22 LAB — GLUCOSE, CAPILLARY: Glucose-Capillary: 96 mg/dL (ref 70–99)

## 2013-02-22 MED ORDER — WARFARIN SODIUM 2.5 MG PO TABS
2.5000 mg | ORAL_TABLET | Freq: Once | ORAL | Status: AC
  Administered 2013-02-22: 2.5 mg via ORAL
  Filled 2013-02-22: qty 1

## 2013-02-22 NOTE — Progress Notes (Signed)
ANTICOAGULATION CONSULT NOTE - Follow Up Consult  Pharmacy Consult for Heparin/Coumadin Indication: atrial fibrillation  Allergies  Allergen Reactions  . Penicillins Other (See Comments)    unknown    Patient Measurements: Height: 5' 2.99" (160 cm) Weight: 191 lb 12.8 oz (87 kg) IBW/kg (Calculated) : 52.38 Heparin Dosing Weight: 71.5 kg  Vital Signs: Temp: 98.3 F (36.8 C) (06/10 0450) BP: 139/69 mmHg (06/10 0450) Pulse Rate: 61 (06/10 0516)  Labs:  Recent Labs  02/20/13 0400 02/21/13 0450 02/22/13 0450  HGB 13.7 13.6 13.0  HCT 42.4 40.5 40.5  PLT 219 216 204  LABPROT 13.5 15.5* 21.1*  INR 1.04 1.25 1.90*  HEPARINUNFRC 0.56 0.54 0.62  CREATININE 0.90  --   --     Estimated Creatinine Clearance: 79.9 ml/min (by C-G formula based on Cr of 0.9).   Assessment: post TIA for follow up, found to be in Afib with RVR (150s) to start anticoagulation  Anticoagulation: Afib/flutter - Heparin/Coumadin. INR up from 1.25 to 1.9 after 3 doses. Heparin level 0.62 in goal range. CBC stable.  Goal of Therapy:  INR 2-3 Monitor platelets by anticoagulation protocol: Yes   Plan:  1) Repeat coumadin 2.5mg  x 1 to slow the rise in INR 2) Continue heparin at 1200 units/hr 3) Follow up heparin level, INR, CBC in AM     Cadan Maggart S. Merilynn Finland, PharmD, BCPS Clinical Staff Pharmacist Pager [phone]  Misty Stanley Stillinger 02/22/2013,8:52 AM

## 2013-02-22 NOTE — Progress Notes (Signed)
Subjective: No complaints  Objective: Vital signs in last 24 hours: Temp:  [98.3 F (36.8 C)-98.5 F (36.9 C)] 98.3 F (36.8 C) (06/10 0450) Pulse Rate:  [61-115] 61 (06/10 0516) Resp:  [17] 17 (06/09 1400) BP: (127-139)/(67-92) 139/69 mmHg (06/10 0450) SpO2:  [97 %-99 %] 99 % (06/10 0450) Weight:  [191 lb 12.8 oz (87 kg)] 191 lb 12.8 oz (87 kg) (06/10 0450) Last BM Date: 02/21/13  Intake/Output from previous day: 06/09 0701 - 06/10 0700 In: 864 [P.O.:720; I.V.:144] Out: -  Intake/Output this shift: Total I/O In: 240 [P.O.:240] Out: -   Medications Current Facility-Administered Medications  Medication Dose Route Frequency Provider Last Rate Last Dose  . aspirin EC tablet 81 mg  81 mg Oral Daily Wilburt Finlay, PA-C   81 mg at 02/22/13 1019  . diltiazem (CARDIZEM CD) 24 hr capsule 240 mg  240 mg Oral Daily Ashni Lonzo, MD   240 mg at 02/22/13 1019  . flecainide (TAMBOCOR) tablet 50 mg  50 mg Oral Q12H Taegen Lennox, MD   50 mg at 02/22/13 1019  . heparin ADULT infusion 100 units/mL (25000 units/250 mL)  1,200 Units/hr Intravenous Continuous Severiano Gilbert, [facility] 12 mL/hr at 02/20/13 1831 1,200 Units/hr at 02/20/13 1831  . hydrALAZINE (APRESOLINE) tablet 37.5 mg  37.5 mg Oral TID Kevon Tench, MD   37.5 mg at 02/22/13 1019  . metoprolol tartrate (LOPRESSOR) tablet 25 mg  25 mg Oral BID Wilburt Finlay, PA-C   25 mg at 02/22/13 1019  . sodium chloride 0.9 % injection 3 mL  3 mL Intravenous Q12H Wilburt Finlay, PA-C   3 mL at 02/21/13 1000  . warfarin (COUMADIN) tablet 2.5 mg  2.5 mg Oral ONCE-1800 [place], [facility]      . Warfarin - Pharmacist Dosing Inpatient   Does not apply q1800 Fredrik Rigger, [facility]        PE: General appearance: alert, cooperative and no distress Lungs: clear to auscultation bilaterally Heart: irregularly irregular rhythm Extremities: No LEE Pulses: 2+ and symmetric Neurologic: Grossly normal  Lab Results:   Recent Labs  02/20/13 0400 02/21/13 0450 02/22/13 0450  WBC 12.7* 12.5* 11.1*  HGB 13.7 13.6 13.0  HCT 42.4 40.5 40.5  PLT 219 216 204   BMET  Recent Labs  02/20/13 0400  NA 139  K 4.3  CL 103  CO2 26  GLUCOSE 101*  BUN 10  CREATININE 0.90  CALCIUM 9.6   PT/INR  Recent Labs  02/20/13 0400 02/21/13 0450 02/22/13 0450  LABPROT 13.5 15.5* 21.1*  INR 1.04 1.25 1.90*    Assessment/Plan   Principal Problem:   Atrial flutter with rapid ventricular response Active Problems:   Hx-TIA (transient ischemic attack) 02/05/13   HTN (hypertension)  Plan:  Continues in Atrial flutter with a relatively well controlled rate.  On flecainide.  Now on coumadin with INR of 1.9.  Will attempt DCCV again prior to DC when INR is therapeutic which will likely be tomorrow.     LOS: 7 days    HAGER, BRYAN 02/22/2013 11:11 AM  I have seen and examined the patient along with Wilburt Finlay, PA.  I have reviewed the chart, notes and new data.  I agree with PA/NP's note.  Key new complaints: none Key examination changes: irregular rhythm Key new findings / data: INR 1.9  PLAN: Plan another attempt at cardioversion tomorrow, before discharge. If unsuccessful or she has early AF recurrence, I would discontinue the  flecainide and refer for EP evaluation for ablation therapy. Baseline QTc in sinus rhythm is 472 ms and class III antiarrhythmics may not be a good choice.  [name], MD, [facility] [phone] 02/22/2013, 12:21 PM

## 2013-02-22 NOTE — Progress Notes (Signed)
  DCCV scheduled for 0900hrs with Dr. Herbie Baltimore tomorrow.  NPO after MDN.  Mahmoud Blazejewski 2:12 PM

## 2013-02-23 ENCOUNTER — Encounter (HOSPITAL_COMMUNITY): Payer: Self-pay | Admitting: Anesthesiology

## 2013-02-23 ENCOUNTER — Inpatient Hospital Stay (HOSPITAL_COMMUNITY): Payer: MEDICAID | Admitting: Anesthesiology

## 2013-02-23 ENCOUNTER — Encounter (HOSPITAL_COMMUNITY)
Admission: EM | Disposition: A | Discharge status: Home / Self Care | Payer: Self-pay | Source: Home / Self Care | Attending: Internal Medicine

## 2013-02-23 HISTORY — PX: CARDIOVERSION: SHX1299

## 2013-02-23 LAB — GLUCOSE, CAPILLARY
Glucose-Capillary: 95 mg/dL (ref 70–99)
Glucose-Capillary: 138 mg/dL — ABNORMAL HIGH (ref 70–99)
Glucose-Capillary: 110 mg/dL — ABNORMAL HIGH (ref 70–99)

## 2013-02-23 LAB — CBC
Hemoglobin: 13.4 g/dL (ref 12.0–15.0)
MCHC: 32.4 g/dL (ref 30.0–36.0)
RBC: 4.9 MIL/uL (ref 3.87–5.11)
WBC: 10.8 10*3/uL — ABNORMAL HIGH (ref 4.0–10.5)

## 2013-02-23 LAB — PROTIME-INR: Prothrombin Time: 20.7 seconds — ABNORMAL HIGH (ref 11.6–15.2)

## 2013-02-23 LAB — HEPARIN LEVEL (UNFRACTIONATED): Heparin Unfractionated: 0.52 IU/mL (ref 0.30–0.70)

## 2013-02-23 SURGERY — CARDIOVERSION
Anesthesia: Monitor Anesthesia Care | Wound class: Clean

## 2013-02-23 MED ORDER — SODIUM CHLORIDE 0.9 % IV SOLN
250.0000 mL | INTRAVENOUS | Status: DC
  Administered 2013-02-23: 250 mL via INTRAVENOUS

## 2013-02-23 MED ORDER — HYDROCORTISONE 1 % EX CREA
1.0000 "application " | TOPICAL_CREAM | Freq: Three times a day (TID) | CUTANEOUS | Status: DC | PRN
  Filled 2013-02-23: qty 28

## 2013-02-23 MED ORDER — LIDOCAINE HCL (CARDIAC) 20 MG/ML IV SOLN
INTRAVENOUS | Status: DC | PRN
  Administered 2013-02-23: 50 mg via INTRAVENOUS

## 2013-02-23 MED ORDER — DILTIAZEM HCL ER COATED BEADS 180 MG PO CP24
180.0000 mg | ORAL_CAPSULE | Freq: Every day | ORAL | Status: DC
  Filled 2013-02-23: qty 1

## 2013-02-23 MED ORDER — WARFARIN SODIUM 10 MG PO TABS
10.0000 mg | ORAL_TABLET | ORAL | Status: AC
  Administered 2013-02-23: 10 mg via ORAL
  Filled 2013-02-23: qty 1

## 2013-02-23 MED ORDER — SODIUM CHLORIDE 0.9 % IJ SOLN: 3.0000 mL | INTRAMUSCULAR | Status: DC | PRN

## 2013-02-23 MED ORDER — SODIUM CHLORIDE 0.9 % IV SOLN
INTRAVENOUS | Status: DC | PRN
  Administered 2013-02-23: 09:00:00 via INTRAVENOUS

## 2013-02-23 MED ORDER — SODIUM CHLORIDE 0.9 % IJ SOLN
3.0000 mL | Freq: Two times a day (BID) | INTRAMUSCULAR | Status: DC
  Administered 2013-02-24: 3 mL via INTRAVENOUS

## 2013-02-23 MED ORDER — PROPOFOL 10 MG/ML IV BOLUS
INTRAVENOUS | Status: DC | PRN
  Administered 2013-02-23: 150 mg via INTRAVENOUS

## 2013-02-23 NOTE — Preoperative (Signed)
Beta Blockers   Reason not to administer Beta Blockers:Metoprolol given 02/22/13 2136 due again 1000 02/23/13

## 2013-02-23 NOTE — CV Procedure (Addendum)
'    NAME:  [name]   MRN: [id number] DOB:  04/04/1966   ADMIT DATE: 02/15/2013   CARDIOLOGIST: Marykay Lex, MD ANESTHESIOLOGIST: Sheldon Silvan, MD Procedure: Electrical Cardioversion Indications:  Atrial Flutter  Procedure Details:  Consent: Risks of procedure as well as the alternatives and risks of each were explained to the (patient/caregiver).  Consent for procedure obtained.  Time Out: Verified patient identification, verified procedure, site/side was marked, verified correct patient position, special equipment/implants available, medications/allergies/relevent history reviewed, required imaging and test results available.  Performed  Patient placed on cardiac monitor, pulse oximetry, supplemental oxygen as necessary.  Sedation given: 50 mg Lidocaine, 150 mg Propofol - per anesthesiology Pacer pads placed anterior and posterior chest.  Cardioverted 1 time(s).  Cardioverted at 150J.  Evaluation: Findings: Post procedure EKG shows: Sinus Bradycardia Complications: None Patient did tolerate procedure well.  Will monitor overnight to ensure that she maintains NSR & until INR is therapeutic.  Time Spent Directly with the Patient:  10 minutes   HARDING,DAVID W, M.D., M.S. [facility] [address] [place], [place]  [address]  [phone]  02/23/2013 9:37 AM

## 2013-02-23 NOTE — Progress Notes (Signed)
ANTICOAGULATION CONSULT NOTE - Follow Up Consult  Pharmacy Consult for Coumadin/Heparin Indication: atrial fibrillation  Allergies  Allergen Reactions  . Penicillins Other (See Comments)    unknown    Patient Measurements: Height: 5' 2.99" (160 cm) Weight: 191 lb 12.8 oz (87 kg) IBW/kg (Calculated) : 52.38 Heparin Dosing Weight:   Vital Signs: BP: 103/65 mmHg (06/11 0957) Pulse Rate: 58 (06/11 0957)  Labs:  Recent Labs  02/21/13 0450 02/22/13 0450 02/23/13 0506  HGB 13.6 13.0 13.4  HCT 40.5 40.5 41.3  PLT 216 204 216  LABPROT 15.5* 21.1* 20.7*  INR 1.25 1.90* 1.85*  HEPARINUNFRC 0.54 0.62 0.52    Estimated Creatinine Clearance: 79.9 ml/min (by C-G formula based on Cr of 0.9).   Assessment: post TIA for follow up, found to be in Afib with RVR (150s) to start anticoagulation  Anticoagulation: Afib/flutter - Heparin/Coumadin. INR up from 1.25>> 1.9>>1.85. CBC stable. Heparin level 0.52 in goal.  Infectious Disease: Tmax 99.5, WBC 10.8  Cardiovascular: hx HTN , afib/flutter w/ rvr - failed DCCV on 6/6, now using flecainide to try and convert to NSR (but still in aflutter). Dr. [name] to reattempt DCCV prior to discharge. VSS Meds: ASA 81mg , Diltiazem, Flecainide, Hydralazine, metoprolol  Endocrinology: serum glucose ok  Gastrointestinal / Nutrition: Heart healthy diet  Neurology: recent admission for TIA 02/05/13 - baby aspirin  Nephrology: Scr 0.90, CrCl 79, lytes ok  Pulmonary: 100% RA  Hematology / Oncology: CBC stable  PTA Medication Issues: addressed   Goal of Therapy:  INR 2-3 Monitor platelets by anticoagulation protocol: Yes   Plan:  1) Coumadin 10mg  po x 1 tonight. 2) Continue heparin at 1200 units/hr 3) Follow up heparin level, INR, CBC in AM   Merilynn Finland, [employer] 02/23/2013,10:02 AM

## 2013-02-23 NOTE — Anesthesia Preprocedure Evaluation (Signed)
Anesthesia Evaluation  Patient identified by MRN, date of birth, ID band Patient awake    Reviewed: Allergy & Precautions, H&P , NPO status , Patient's Chart, lab work & pertinent test results, reviewed documented beta blocker date and time   Airway Mallampati: II TM Distance: >3 FB Neck ROM: Full    Dental  (+) Teeth Intact and Dental Advisory Given   Pulmonary  breath sounds clear to auscultation        Cardiovascular hypertension, Pt. on medications and Pt. on home beta blockers Rhythm:Irregular Rate:Normal     Neuro/Psych    GI/Hepatic   Endo/Other    Renal/GU      Musculoskeletal   Abdominal   Peds  Hematology   Anesthesia Other Findings   Reproductive/Obstetrics                           Anesthesia Physical Anesthesia Plan  ASA: III  Anesthesia Plan: General   Post-op Pain Management:    Induction: Intravenous  Airway Management Planned: Mask  Additional Equipment:   Intra-op Plan:   Post-operative Plan:   Informed Consent: I have reviewed the patients History and Physical, chart, labs and discussed the procedure including the risks, benefits and alternatives for the proposed anesthesia with the patient or authorized representative who has indicated his/her understanding and acceptance.   Dental advisory given  Plan Discussed with: CRNA, Anesthesiologist and Surgeon  Anesthesia Plan Comments:         Anesthesia Quick Evaluation

## 2013-02-23 NOTE — Anesthesia Postprocedure Evaluation (Signed)
  Anesthesia Post-op Note  Patient: Cleon Dew  Procedure(s) Performed: Procedure(s) with comments: CARDIOVERSION (N/A) - BESIDE CV  Patient Location: PACU and Nursing Unit  Anesthesia Type:MAC  Level of Consciousness: awake and patient cooperative  Airway and Oxygen Therapy: Patient Spontanous Breathing and Patient connected to nasal cannula oxygen  Post-op Pain: none  Post-op Assessment: Post-op Vital signs reviewed, Patient's Cardiovascular Status Stable, Respiratory Function Stable, Patent Airway and No signs of Nausea or vomiting  Post-op Vital Signs: Reviewed and stable  Complications: No apparent anesthesia complications

## 2013-02-23 NOTE — Progress Notes (Signed)
Subjective: No complaints; continues to be in Aflutter.  Objective: Vital signs in last 24 hours: Temp:  [98 F (36.7 C)-99.5 F (37.5 C)] 99.5 F (37.5 C) (06/10 2100) Pulse Rate:  [99-108] 108 (06/10 2100) BP: (120-123)/(88) 123/88 mmHg (06/10 2100) SpO2:  [99 %-100 %] 100 % (06/10 2100) Last BM Date: 02/22/13  Intake/Output from previous day: 06/10 0701 - 06/11 0700 In: 723 [P.O.:720; I.V.:3] Out: -  Intake/Output this shift:    Medications Current Facility-Administered Medications  Medication Dose Route Frequency Provider Last Rate Last Dose  . aspirin EC tablet 81 mg  81 mg Oral Daily Wilburt Finlay, PA-C   81 mg at 02/22/13 1019  . diltiazem (CARDIZEM CD) 24 hr capsule 240 mg  240 mg Oral Daily Mihai Croitoru, MD   240 mg at 02/22/13 1019  . flecainide (TAMBOCOR) tablet 50 mg  50 mg Oral Q12H Mihai Croitoru, MD   50 mg at 02/22/13 2136  . heparin ADULT infusion 100 units/mL (25000 units/250 mL)  1,200 Units/hr Intravenous Continuous Severiano Gilbert, [facility] 12 mL/hr at 02/22/13 1556 1,200 Units/hr at 02/22/13 1556  . hydrALAZINE (APRESOLINE) tablet 37.5 mg  37.5 mg Oral TID [name], MD   37.5 mg at 02/22/13 2136  . metoprolol tartrate (LOPRESSOR) tablet 25 mg  25 mg Oral BID Wilburt Finlay, PA-C   25 mg at 02/22/13 2136  . sodium chloride 0.9 % injection 3 mL  3 mL Intravenous Q12H Wilburt Finlay, PA-C   3 mL at 02/21/13 1000  . Warfarin - Pharmacist Dosing Inpatient   Does not apply q1800 Fredrik Rigger, [facility]        PE: General appearance: alert, cooperative and no distress Lungs: clear to auscultation bilaterally, non-labored.  Good air movement Heart: irregularly irregular rhythm, no M/R/G Abd: soft, NT.ND/NABS Extremities: No LEE, no venous stasis  Pulses: 2+ and symmetric Neurologic: Grossly normal  Lab Results:   Recent Labs  02/21/13 0450 02/22/13 0450 02/23/13 0506  WBC 12.5* 11.1* 10.8*  HGB 13.6 13.0 13.4  HCT 40.5 40.5 41.3  PLT 216 204 216     BMET No results found for this basename: NA, K, CL, CO2, GLUCOSE, BUN, CREATININE, CALCIUM,  in the last 72 hours PT/INR  Recent Labs  02/21/13 0450 02/22/13 0450 02/23/13 0506  LABPROT 15.5* 21.1* 20.7*  INR 1.25 1.90* 1.85*    Assessment/Plan   Principal Problem:   Atrial flutter with rapid ventricular response Active Problems:   Hx-TIA (transient ischemic attack) 02/05/13   HTN (hypertension)  Plan:   Continues in Atrial flutter with a reasonably well controlled rate.    On flecainide.    Now on coumadin with INR of 1.85 (down from 1.9).    Otherwise BP is well controlled.  HR reasonably controlled -- on BB & CCB + Hydralazine.  Plan Per Dr. [name] -- re-attempt bedside DCCV again prior to DC today -- continues to be on Heparin as INR not yet therapeutic.   Will need to keep on bridging IV Heparin until INR is therapeutic which will likely be tomorrow. - prior to d/c.  Please see Dr. Erin Hearing note from yesterday re: plans for unsuccessful DCCV.      "If unsuccessful or she has early AF recurrence, I would discontinue the flecainide and refer for EP evaluation for ablation therapy. Baseline QTc in sinus rhythm is 472 ms and class III antiarrhythmics may not be a good choice."  The patient is aware of the  R/B/A/I of DCCV (Just done last week).  She is agreeable to proceed.   LOS: 8 days    HARDING,DAVID W 02/23/2013 9:05 AM

## 2013-02-23 NOTE — Transfer of Care (Signed)
Immediate Anesthesia Transfer of Care Note  Patient: [name]  Procedure(s) Performed: Procedure(s) with comments: CARDIOVERSION (N/A) - BESIDE CV  Patient Location: PACU and Nursing Unit  Anesthesia Type:MAC  Level of Consciousness: awake, oriented and patient cooperative  Airway & Oxygen Therapy: Patient Spontanous Breathing and Patient connected to nasal cannula oxygen  Post-op Assessment: Report given to 3000 RN, Post -op Vital signs reviewed and stable and Patient moving all extremities X 4  Post vital signs: Reviewed and stable  Complications: No apparent anesthesia complications

## 2013-02-23 NOTE — Progress Notes (Signed)
Pt post cardioversion, HR SB in the 50s, BP 99/69, pt to received dilt, metoprolol, and flecainide; per Garen Lah ok to give flecainide and metoprolol and hold diltiazem; will continue to monitor

## 2013-02-24 ENCOUNTER — Encounter (HOSPITAL_COMMUNITY): Payer: Self-pay | Admitting: Cardiology

## 2013-02-24 LAB — CBC
HCT: 37.9 % (ref 36.0–46.0)
Hemoglobin: 12.5 g/dL (ref 12.0–15.0)
MCH: 27.5 pg (ref 26.0–34.0)
MCHC: 33 g/dL (ref 30.0–36.0)
MCV: 83.3 fL (ref 78.0–100.0)
RBC: 4.55 MIL/uL (ref 3.87–5.11)

## 2013-02-24 LAB — GLUCOSE, CAPILLARY
Glucose-Capillary: 94 mg/dL (ref 70–99)
Glucose-Capillary: 98 mg/dL (ref 70–99)

## 2013-02-24 LAB — HEPARIN LEVEL (UNFRACTIONATED): Heparin Unfractionated: 0.46 IU/mL (ref 0.30–0.70)

## 2013-02-24 MED ORDER — WARFARIN SODIUM 7.5 MG PO TABS: 7.5000 mg | ORAL_TABLET | Freq: Every day | ORAL | Status: DC

## 2013-02-24 MED ORDER — DILTIAZEM HCL ER COATED BEADS 120 MG PO CP24: 120.0000 mg | ORAL_CAPSULE | Freq: Every day | ORAL | Status: DC

## 2013-02-24 MED ORDER — FLECAINIDE ACETATE 50 MG PO TABS: 50.0000 mg | ORAL_TABLET | Freq: Two times a day (BID) | ORAL | Status: DC

## 2013-02-24 MED ORDER — WARFARIN SODIUM 10 MG PO TABS
10.0000 mg | ORAL_TABLET | Freq: Once | ORAL | Status: DC
  Filled 2013-02-24: qty 1

## 2013-02-24 MED ORDER — WARFARIN SODIUM 10 MG PO TABS
10.0000 mg | ORAL_TABLET | ORAL | Status: AC
  Administered 2013-02-24: 10 mg via ORAL
  Filled 2013-02-24: qty 1

## 2013-02-24 MED ORDER — DILTIAZEM HCL ER COATED BEADS 120 MG PO CP24
120.0000 mg | ORAL_CAPSULE | Freq: Every day | ORAL | Status: DC
  Filled 2013-02-24: qty 1

## 2013-02-24 MED ORDER — HYDRALAZINE HCL 25 MG PO TABS: 37.5000 mg | ORAL_TABLET | Freq: Three times a day (TID) | ORAL | Status: DC

## 2013-02-24 MED ORDER — METOPROLOL TARTRATE 25 MG PO TABS: 25.0000 mg | ORAL_TABLET | Freq: Two times a day (BID) | ORAL | Status: DC

## 2013-02-24 MED ORDER — ACETAMINOPHEN 325 MG PO TABS
650.0000 mg | ORAL_TABLET | ORAL | Status: DC | PRN
  Administered 2013-02-24: 650 mg via ORAL
  Filled 2013-02-24: qty 2

## 2013-02-24 MED ORDER — ASPIRIN 81 MG PO TBEC: 81.0000 mg | DELAYED_RELEASE_TABLET | Freq: Every day | ORAL | Status: DC

## 2013-02-24 NOTE — Progress Notes (Signed)
ANTICOAGULATION CONSULT NOTE - Follow Up Consult  Pharmacy Consult for Coumadin/Heparin Indication: atrial fibrillation  Allergies  Allergen Reactions  . Penicillins Other (See Comments)    unknown    Patient Measurements: Height: 5' 2.99" (160 cm) Weight: 191 lb 12.8 oz (87 kg) IBW/kg (Calculated) : 52.38 Heparin Dosing Weight:   Vital Signs: Temp: 98.2 F (36.8 C) (06/12 0544) Temp src: Oral (06/12 0544) BP: 140/89 mmHg (06/12 0544) Pulse Rate: 51 (06/12 0544)  Labs:  Recent Labs  02/22/13 0450 02/23/13 0506 02/24/13 0441  HGB 13.0 13.4 12.5  HCT 40.5 41.3 37.9  PLT 204 216 200  LABPROT 21.1* 20.7* 23.7*  INR 1.90* 1.85* 2.23*  HEPARINUNFRC 0.62 0.52 0.46    Estimated Creatinine Clearance: 79.9 ml/min (by C-G formula based on Cr of 0.9).   Assessment: post TIA for follow up, found to be in Afib with RVR (150s) to start anticoagulation  Anticoagulation: Afib/flutter - Heparin/Coumadin. INR now in goal at 2.23. Hgb down to 12.5. Heparin level 0.46 in goal but can discontinue  Infectious Disease: Afebrile. WBC 9.1.  Cardiovascular: hx HTN , afib/flutter w/ rvr - failed DCCV on 6/6, now using flecainide to try and convert to NSR (but still in aflutter). Dr. [name] to reattempt DCCV prior to discharge. VSS Meds: ASA 81mg , Diltiazem, Flecainide, Hydralazine, metoprolol  Endocrinology: serum glucose ok  Gastrointestinal / Nutrition: Heart healthy diet  Neurology: recent admission for TIA 02/05/13 - baby aspirin  Nephrology: Scr 0.90, CrCl 79, lytes ok  Pulmonary: 100% RA  Hematology / Oncology: CBC stable  PTA Medication Issues: addressed   Goal of Therapy:  INR 2-3 Monitor platelets by anticoagulation protocol: Yes   Plan:  1) Coumadin 10mg  po x 1 tonight, then try 7.5mg  daily for maintenance 2) D/C IV heparin  Nielle Duford S. Merilynn Finland, PharmD, BCPS Clinical Staff Pharmacist Pager [phone]  Misty Stanley Stillinger 02/24/2013,8:38 AM

## 2013-02-24 NOTE — Plan of Care (Addendum)
Problem: Discharge Progression Outcomes Goal: Activity appropriate for discharge plan Outcome: Completed/Met Date Met:  02/24/13 Pt denies SOB dizziness lightheadedness while up walking.Morene Antu without difficulty

## 2013-02-24 NOTE — Discharge Summary (Signed)
Physician Discharge Summary  Patient ID: [name] MRN: [id number] DOB/AGE: 1966-02-19 47 y.o.  Admit date: 02/15/2013 Discharge date: 02/24/2013  Admission Diagnoses: Atrial Flutter with Rapid Ventricular Response  Discharge Diagnoses:  Principal Problem:   Atrial flutter with rapid ventricular response Active Problems:   Hx-TIA (transient ischemic attack) 02/05/13   HTN (hypertension)   Discharged Condition: stable  Hospital Course: The patient is a 47 year old overweight African American female with a history of paroxysmal atrial fibrillation (on daily ASA), recent TIA,  and hypertension. The patient was seen by Dr. Thedore Mins, her PCP, in clinic on 02/15/13 and was noted have a rapid heart rate. EKG revealed atrial flutter with a rapid ventricular response in the 140s. She was completely asymptomatic. She was sent directly to the ER, where she recieved IV Cardizem and placed on IV heparin. She was placed on telemetry. Her HR stabilized on Cardizem, however, she continued in flutter. It was decided to have the patient undergo a TEE/DCCV. This was initially performed by Dr. [name]. The TEE showed no evidence of an atrial thrombus. Her cardioversion was unsuccessful. She was cardioverted 3 times, once at 120 J and twice at 150 J. Each time, she converted briefly to NSR, with recurrence to atrial flutter or atrial fibrillation each time. She maintained NSR for less than 1 minute each time. Her post procedure EKG showed atrial fibrillation. At that point, it was decided to start the patient on an antiarrhythmic to maintain NSR. She was started on Flecainide, as well as Warfarin. A NOAC was initially considered, however Warfarin was considered the best option, due to the fact that the patient does not have insurance. Pharmacy assisted with dosing. Her INR goal was set to 2.0-3.0. She was bridged with heparin. The patient remained in atrial flutter, and it was decided to have her undergo another attempt at  cardioversion. This was performed by Dr. Herbie Baltimore. This attempt was successful. She required 1 shock at 150 J. Post procedure EKG showed sinus bradycardia. She tolerated the procedure well. She was kept an additional night to ensure that she maintained NSR and to ensure that her INR was therapeutic. The following day, she was seen and examined by Dr. Herbie Baltimore. She continued in sinus rhythm but remained bradycardic with a HR in the 50s. The patient was asymptomatic. Her CCB was held, but she continued on her BB. She ambulated around the unit with a RN and had an appropriate HR response. Her HR increased to the upper 50s-low 60s and she remained without symptoms.  She was last seen and examined by Dr. Herbie Baltimore, who determined that she was stable for discharge home. It was decided to completely withhold her CCB until seen in follow-up. Her INR reached therapeutic range at 2.23. She was ordered to receive 10 mg of Warfarin prior to discharge and to continue at 7.5 mg daily. She is scheduled to see Phylis Bougie, pharmacist, at [facility], for an INR check on 02/28/13. At that time, she will also follow up with Wilburt Finlay, PA-C. She will get an EKG at that appointment. It will be decided then by Wilburt Finlay if Cardizem should be restarted at a dose of 120 mg daily. Dr. [name] had also recommended the patient undergo a sleep study to assess for sleep apnea. Our office will call the patient with the appointment.    Consults: None  Significant Diagnostic Studies:   TEE 02/18/13 FINDINGS:  Dilated right atrium  Left atrium normal in size.  No LA thrombus  was identified.  Low appendage emptying velocities.  LVEF 45%, mild global hypokinesis  2+ AI, trivial MR, 2+ TR, unable to get good TR jet alignment for PA pressure estimation.   Treatments: See Hospital Course  Discharge Exam: Blood pressure 121/69, pulse 53, temperature 98.2 F (36.8 C), temperature source Oral, resp. rate 18, height 5' 2.99" (1.6 m), weight  191 lb 12.8 oz (87 kg), SpO2 100.00%.  Disposition: 01-Home or Self Care  Discharge Orders   Future Appointments Provider Department Dept Phone   02/28/2013 10:20 AM Wilburt Finlay, PA-C [facility] [place] [phone]   02/28/2013 11:00 AM Phillips Hay, RPH-CPP [facility] [place] [phone]   Future Orders Complete By Expires     Diet - low sodium heart healthy  As directed     Increase activity slowly  As directed         Medication List    STOP taking these medications       aspirin 325 MG tablet      TAKE these medications       aspirin 81 MG EC tablet  Take 1 tablet (81 mg total) by mouth daily.     flecainide 50 MG tablet  Commonly known as:  TAMBOCOR  Take 1 tablet (50 mg total) by mouth every 12 (twelve) hours.     hydrALAZINE 25 MG tablet  Commonly known as:  APRESOLINE  Take 1.5 tablets (37.5 mg total) by mouth 3 (three) times daily.     metoprolol tartrate 25 MG tablet  Commonly known as:  LOPRESSOR  Take 1 tablet (25 mg total) by mouth 2 (two) times daily.     warfarin 7.5 MG tablet  Commonly known as:  COUMADIN  Take 1 tablet (7.5 mg total) by mouth daily.           Follow-up Information   Follow up with [facility] On 02/28/2013. (for INR check 11:00 am )    Contact information:   [phone]      Follow up with HAGER, BRYAN, PA-C On 02/28/2013. (10:20 am)    Contact information:   [address] [place] [address] [phone]      TIME SPENT ON DISCHARGE, INCLUDING PHYSICIAN TIME: >30 MINUTES  Signed: Allayne Butcher, PA-C 02/24/2013, 2:12 PM  I saw the patient today prior to d/c.  Maintaining NSR (SBrady in 40s-50s up to 60s with ambulation s/p DCCV yesterday on CCB, BB & flecainide.  INR is therapeutic.  No residua of TIA.    With bradycardia, will hold CCB until seen by Pharm D Phillips Hay @ [facility] on Mondayfor INR check -- will  also need ECG.   D/c dose of Warfarin 7.5 mg after 10mg  today.  Continue BID BB @ current dose + Hydralazine.    Will need close f/u appt with Dr. [name] or PA/NP from [facility].  Marykay Lex, M.D., M.S. [facility] [address] [place], [place]  [address]  [phone] Pager # [phone] 02/24/2013 5:52 PM

## 2013-02-24 NOTE — Progress Notes (Signed)
Patient ID: [name], female   DOB: 10/04/1965, 47 y.o.   MRN: [id number]  Subjective: No complaints; Successful DCCV to [facility] yesterday  Tele: Mostly [facility] with rare PACs o/n - rates 48-70  Objective: Vital signs in last 24 hours: Temp:  [98 F (36.7 C)-98.7 F (37.1 C)] 98.2 F (36.8 C) (06/12 0544) Pulse Rate:  [51-69] 53 (06/12 1013) Resp:  [18] 18 (06/12 0544) BP: (96-140)/(62-89) 121/69 mmHg (06/12 1013) SpO2:  [99 %-100 %] 100 % (06/12 0544) Last BM Date: 02/23/13  Intake/Output from previous day: 06/11 0701 - 06/12 0700 In: 990 [P.O.:840; I.V.:150] Out: -  Intake/Output this shift: Total I/O In: 360 [P.O.:360] Out: -   Medications Current Facility-Administered Medications  Medication Dose Route Frequency Provider Last Rate Last Dose  . 0.9 %  sodium chloride infusion  250 mL Intravenous Continuous Marykay Lex, MD 1 mL/hr at 02/23/13 1245 250 mL at 02/23/13 1245  . acetaminophen (TYLENOL) tablet 650 mg  650 mg Oral Q4H PRN Chrystie Nose, MD   650 mg at 02/24/13 4098  . aspirin EC tablet 81 mg  81 mg Oral Daily Wilburt Finlay, PA-C   81 mg at 02/24/13 1013  . [START ON 02/25/2013] diltiazem (CARDIZEM CD) 24 hr capsule 120 mg  120 mg Oral Daily Marykay Lex, MD      . flecainide [facility]) tablet 50 mg  50 mg Oral Q12H Mihai Croitoru, MD   50 mg at 02/23/13 2124  . hydrALAZINE (APRESOLINE) tablet 37.5 mg  37.5 mg Oral TID [name], MD   37.5 mg at 02/23/13 2216  . hydrocortisone cream 1 % 1 application  1 application Topical TID PRN Marykay Lex, MD      . metoprolol tartrate (LOPRESSOR) tablet 25 mg  25 mg Oral BID Wilburt Finlay, PA-C   25 mg at 02/24/13 1014  . sodium chloride 0.9 % injection 3 mL  3 mL Intravenous Q12H Wilburt Finlay, PA-C   3 mL at 02/21/13 1000  . sodium chloride 0.9 % injection 3 mL  3 mL Intravenous Q12H Marykay Lex, MD      . sodium chloride 0.9 % injection 3 mL  3 mL Intravenous PRN Marykay Lex, MD      . warfarin  (COUMADIN) tablet 10 mg  10 mg Oral [address] [place], [facility]      . Warfarin - Pharmacist Dosing Inpatient   Does not apply q1800 Fredrik Rigger, [facility]        PE: General appearance: alert, cooperative and no distress, pleasant mood & affect. Lungs: clear to auscultation bilaterally, non-labored.  Good air movement Heart: bradycardic rate, regularly rhythm, no M/R/G Abd: soft, NT.ND/NABS Extremities: No LEE, no venous stasis  Pulses: 2+ and symmetric Neurologic: Grossly normal  Lab Results:   Recent Labs  02/22/13 0450 02/23/13 0506 02/24/13 0441  WBC 11.1* 10.8* 9.1  HGB 13.0 13.4 12.5  HCT 40.5 41.3 37.9  PLT 204 216 200   BMET No results found for this basename: NA, K, CL, CO2, GLUCOSE, BUN, CREATININE, CALCIUM,  in the last 72 hours PT/INR  Recent Labs  02/22/13 0450 02/23/13 0506 02/24/13 0441  LABPROT 21.1* 20.7* 23.7*  INR 1.90* 1.85* 2.23*    Assessment/Plan   Principal Problem:   Atrial flutter with rapid ventricular response Active Problems:   Hx-TIA (transient ischemic attack) 02/05/13   HTN (hypertension)  Successful 2nd attempt @ DCCV to S Bradycardia.  Will reduce CCB dose to 120mg  & keep BB @ current dose  -- ambulate to ensure appropriate HR response.  INR now >2.0 -- plan is 1 more dose of 10mg  then maintenance 7.5 --> will need INR check @ [facility] on Monday.  BP stable on BB, CCB & Hydralazine.  If HR response to ambulation is appropriate, anticipate d/c this afternoon (can dose warfarin prior to d/c on 7.5 mg daily)   LOS: 9 days    Shyrl Obi W 02/24/2013 10:16 AM

## 2013-02-28 ENCOUNTER — Ambulatory Visit: Payer: Self-pay | Admitting: Physician Assistant

## 2013-02-28 ENCOUNTER — Ambulatory Visit (INDEPENDENT_AMBULATORY_CARE_PROVIDER_SITE_OTHER): Payer: Self-pay | Admitting: Pharmacist Clinician (PhC)/ Clinical Pharmacy Specialist

## 2013-02-28 DIAGNOSIS — Z7901 Long term (current) use of anticoagulants: Secondary | ICD-10-CM

## 2013-02-28 DIAGNOSIS — I4892 Unspecified atrial flutter: Secondary | ICD-10-CM

## 2013-03-07 ENCOUNTER — Ambulatory Visit: Payer: Self-pay | Admitting: Pharmacist Clinician (PhC)/ Clinical Pharmacy Specialist

## 2013-03-11 ENCOUNTER — Telehealth: Payer: Self-pay | Admitting: Physician Assistant

## 2013-03-11 NOTE — Telephone Encounter (Signed)
[name] called to cancel her appt due to financial reasons , but wants to know is there anything other type of blood thinner that she can be on without having to come in and get her blood checked weekly , because she cannot come in once a week and afford the payment that is required .[name]  Thanks

## 2013-03-11 NOTE — Telephone Encounter (Signed)
Spoke with patient. She states she does not have insurance and she does not want to come for protimes wanted other medications. Informed Ms Stroope that someone would call her on Monday 03/14/13.  She is aware that she needs to see PA/MD to make a determination about med changes if possible. Ms [place] was in agreement.

## 2013-03-15 ENCOUNTER — Ambulatory Visit: Payer: Self-pay | Admitting: Physician Assistant

## 2013-05-12 ENCOUNTER — Ambulatory Visit: Payer: Self-pay

## 2013-05-30 ENCOUNTER — Telehealth: Payer: Self-pay | Admitting: Pharmacist Clinician (PhC)/ Clinical Pharmacy Specialist

## 2013-05-30 NOTE — Telephone Encounter (Signed)
Overdue INR letter sent 

## 2013-06-09 ENCOUNTER — Ambulatory Visit: Payer: Self-pay

## 2013-06-15 ENCOUNTER — Encounter: Payer: Self-pay | Admitting: Family Medicine

## 2013-06-15 ENCOUNTER — Ambulatory Visit: Payer: Self-pay | Attending: Internal Medicine | Admitting: Family Medicine

## 2013-06-15 VITALS — BP 122/83 | HR 60 | Temp 98.6°F | Resp 16 | Wt 201.0 lb

## 2013-06-15 DIAGNOSIS — Z87891 Personal history of nicotine dependence: Secondary | ICD-10-CM

## 2013-06-15 DIAGNOSIS — K439 Ventral hernia without obstruction or gangrene: Secondary | ICD-10-CM | POA: Insufficient documentation

## 2013-06-15 DIAGNOSIS — F17210 Nicotine dependence, cigarettes, uncomplicated: Secondary | ICD-10-CM | POA: Insufficient documentation

## 2013-06-15 DIAGNOSIS — I10 Essential (primary) hypertension: Secondary | ICD-10-CM

## 2013-06-15 DIAGNOSIS — I4892 Unspecified atrial flutter: Secondary | ICD-10-CM

## 2013-06-15 DIAGNOSIS — Z8673 Personal history of transient ischemic attack (TIA), and cerebral infarction without residual deficits: Secondary | ICD-10-CM

## 2013-06-15 DIAGNOSIS — Z7901 Long term (current) use of anticoagulants: Secondary | ICD-10-CM

## 2013-06-15 DIAGNOSIS — R000 Tachycardia, unspecified: Secondary | ICD-10-CM

## 2013-06-15 DIAGNOSIS — R109 Unspecified abdominal pain: Secondary | ICD-10-CM | POA: Insufficient documentation

## 2013-06-15 MED ORDER — KETOCONAZOLE 2 % EX CREA: TOPICAL_CREAM | Freq: Every day | CUTANEOUS | Status: DC

## 2013-06-15 NOTE — Patient Instructions (Addendum)
Atrial Fibrillation Atrial fibrillation is an abnormal heartbeat (rhythm). It can cause your heart rate to be faster or slower than normal, and can cause clots of blood to form in your heart. These clots can cause other health problems. Atrial fibrillation may be caused by a heart attack, lung problem, or certain medicine. Sometimes the cause of atrial fibrillation is not found. HOME CARE  Take blood thinning medicine (anticoagulants) as told by your doctor. Your doctor will need to draw your blood to check lab values if you take blood thinners.  If you had a cardioversion, limit your activity as told by your doctor.  Learn how to check your heartbeat (pulse) for an abnormal or irregular beat. Your doctor can show you how.  Ask your doctor if it is okay to exercise.  Only take medicine as told by your doctor. GET HELP RIGHT AWAY IF:   You have trouble breathing or feel dizzy.  You have puffy (swollen) feet or ankles.  You have blood in your pee (urine) or poop (bowel movement).  You feel your heart "skipping" beats.  You feel your heart "racing" or beating fast.  You have weakness in your arms or legs.  You have trouble talking, seeing, or thinking.  You have chest pain or pain in your arm or jaw. MAKE SURE YOU:   Understand these instructions.  Will watch your condition.  Will get help right away if you are not doing well or get worse. Document Released: 06/10/2008 Document Revised: 11/24/2011 Document Reviewed: 12/20/2009 [facility] Patient Information 2014 [place], [place]. Ventral Hernia A ventral hernia (also called an incisional hernia) is a hernia that occurs at the site of a previous surgical cut (incision) in the abdomen. The abdominal wall spans from your lower chest down to your pelvis. If the abdominal wall is weakened from a surgical incision, a hernia can occur. A hernia is a bulge of bowel or muscle tissue pushing out on the weakened part of the abdominal wall.  Ventral hernias can get bigger from straining or lifting. Obese and older people are at higher risk for a ventral hernia. People who develop infections after surgery or require repeat incisions at the same site on the abdomen are also at increased risk. CAUSES  A ventral hernia occurs because of weakness in the abdominal wall at an incision site.  SYMPTOMS  Common symptoms include:  A visible bulge or lump on the abdominal wall.  Pain or tenderness around the lump.  Increased discomfort if you cough or make a sudden movement. If the hernia has blocked part of the intestine, a serious complication can occur (incarcerated or strangulated hernia). This can become a problem that requires emergency surgery because the blood flow to the blocked intestine may be cut off. Symptoms may include:  Feeling sick to your stomach (nauseous).  Throwing up (vomiting).  Stomach swelling (distention) or bloating.  Fever.  Rapid heartbeat. DIAGNOSIS  Your caregiver will take a medical history and perform a physical exam. Various tests may be ordered, such as:  Blood tests.  Urine tests.  Ultrasonography.  X-rays.  Computed tomography (CT). TREATMENT  Watchful waiting may be all that is needed for a smaller hernia that does not cause symptoms. Your caregiver may recommend the use of a supportive belt (truss) that helps to keep the abdominal wall intact. For larger hernias or those that cause pain, surgery to repair the hernia is usually recommended. If a hernia becomes strangulated, emergency surgery needs to be done  right away. HOME CARE INSTRUCTIONS  Avoid putting pressure or strain on the abdominal area.  Avoid heavy lifting.  Use good body positioning for physical tasks. Ask your caregiver about proper body positioning.  Use a supportive belt as directed by your caregiver.  Maintain a healthy weight.  Eat foods that are high in fiber, such as whole grains, fruits, and vegetables.  Fiber helps prevent difficult bowel movements (constipation).  Drink enough fluids to keep your urine clear or pale yellow.  Follow up with your caregiver as directed. SEEK MEDICAL CARE IF:   Your hernia seems to be getting larger or more painful. SEEK IMMEDIATE MEDICAL CARE IF:   You have abdominal pain that is sudden and sharp.  Your pain becomes severe.  You have repeated vomiting.  You are sweating a lot.  You notice a rapid heartbeat.  You develop a fever. MAKE SURE YOU:   Understand these instructions.  Will watch your condition.  Will get help right away if you are not doing well or get worse. Document Released: 08/18/2012 Document Reviewed: 08/06/2012 [facility] Patient Information 2014 [place], [place].

## 2013-06-15 NOTE — Progress Notes (Signed)
Patient ID: [name], female   DOB: 1966-02-14, 46 y.o.   MRN: [id number]  CC: follow up   HPI: Pt is wanting to see a surgeon about her worsening symptoms of her ventral hernia.  She saw a Careers adviser a couple of years ago and has been watching it but symptoms have been getting worse and having some abd pain and poking out of the hernia at times causing some problems.  Pt has no insurance and stopped seeing cardiology and going to coumadin clinic.  She has been taking warfarin but not having it monitored.   Allergies  Allergen Reactions  . Penicillins Other (See Comments)    unknown   Past Medical History  Diagnosis Date  . Pneumonia   . Paroxysmal a-fib   . Shortness of breath   . Hypertension   . Transient ischemic attack (TIA) 01/2013  . Encounter for long-term (current) use of other medications    Current Outpatient Prescriptions on File Prior to Visit  Medication Sig Dispense Refill  . aspirin EC 81 MG EC tablet Take 1 tablet (81 mg total) by mouth daily.      . flecainide (TAMBOCOR) 50 MG tablet Take 1 tablet (50 mg total) by mouth every 12 (twelve) hours.  60 tablet  5  . hydrALAZINE (APRESOLINE) 25 MG tablet Take 1.5 tablets (37.5 mg total) by mouth 3 (three) times daily.  135 tablet  5  . metoprolol tartrate (LOPRESSOR) 25 MG tablet Take 1 tablet (25 mg total) by mouth 2 (two) times daily.  60 tablet  5  . warfarin (COUMADIN) 7.5 MG tablet Take 1 tablet (7.5 mg total) by mouth daily.  30 tablet  5   No current facility-administered medications on file prior to visit.   Family History  Problem Relation Age of Onset  . Hypertension    . Diabetes     History   Social History  . Marital Status: Single    Spouse Name: N/A    Number of Children: N/A  . Years of Education: N/A   Occupational History  . assembly line    Social History Main Topics  . Smoking status: Former Smoker -- 3 years    Types: Cigars  . Smokeless tobacco: Former Neurosurgeon    Quit date: 03/15/2013   . Alcohol Use: 2.0 oz/week    4 drink(s) per week     Comment: twice a wek   . Drug Use: No  . Sexual Activity: Not on file   Other Topics Concern  . Not on file   Social History Narrative  . No narrative on file    Review of Systems  Constitutional: Negative for fever, chills, diaphoresis, activity change, appetite change and fatigue.  HENT: Negative for ear pain, nosebleeds, congestion, facial swelling, rhinorrhea, neck pain, neck stiffness and ear discharge.   Eyes: Negative for pain, discharge, redness, itching and visual disturbance.  Respiratory: Negative for cough, choking, chest tightness, shortness of breath, wheezing and stridor.   Cardiovascular: Negative for chest pain, palpitations and leg swelling.  Gastrointestinal: Negative for abdominal distention.  Genitourinary: Negative for dysuria, urgency, frequency, hematuria, flank pain, decreased urine volume, difficulty urinating and dyspareunia.  Musculoskeletal: Negative for back pain, joint swelling, arthralgias and gait problem.  Neurological: Negative for dizziness, tremors, seizures, syncope, facial asymmetry, speech difficulty, weakness, light-headedness, numbness and headaches.  Hematological: Negative for adenopathy. Does not bruise/bleed easily.  Psychiatric/Behavioral: Negative for hallucinations, behavioral problems, confusion, dysphoric mood, decreased concentration and agitation.  Objective:   Filed Vitals:   06/15/13 1713  BP: 122/83  Pulse: 60  Temp: 98.6 F (37 C)  Resp: 16    Physical Exam  Constitutional: Appears well-developed and well-nourished. No distress.  HENT: Normocephalic. External right and left ear normal. Oropharynx is clear and moist.  Eyes: Conjunctivae and EOM are normal. PERRLA, no scleral icterus.  Neck: Normal ROM. Neck supple. No JVD. No tracheal deviation. No thyromegaly.  CVS: RRR, S1/S2 +, no murmurs, no gallops, no carotid bruit.  Pulmonary: Effort and breath sounds  normal, no stridor, rhonchi, wheezes, rales.  Abdominal: Soft. BS +,  no distension, tenderness, rebound or guarding.  Musculoskeletal: Normal range of motion. No edema and no tenderness.  Lymphadenopathy: No lymphadenopathy noted, cervical, inguinal. Neuro: Alert. Normal reflexes, muscle tone coordination. No cranial nerve deficit. Skin: Skin is warm and dry. No rash noted. Not diaphoretic. No erythema. No pallor.  Psychiatric: Normal mood and affect. Behavior, judgment, thought content normal.   Lab Results  Component Value Date   WBC 9.1 02/24/2013   HGB 12.5 02/24/2013   HCT 37.9 02/24/2013   MCV 83.3 02/24/2013   PLT 200 02/24/2013   Lab Results  Component Value Date   CREATININE 0.90 02/20/2013   BUN 10 02/20/2013   NA 139 02/20/2013   K 4.3 02/20/2013   CL 103 02/20/2013   CO2 26 02/20/2013    Lab Results  Component Value Date   HGBA1C 5.7* 02/06/2013   Lipid Panel     Component Value Date/Time   CHOL 129 02/06/2013 0530   TRIG 70 02/06/2013 0530   HDL 30* 02/06/2013 0530   CHOLHDL 4.3 02/06/2013 0530   VLDL 14 02/06/2013 0530   LDLCALC 85 02/06/2013 0530       Assessment and plan:   Patient Active Problem List   Diagnosis Date Noted  . Ex-smoker 06/15/2013  . Ventral hernia 06/15/2013  . Abdominal pain, unspecified site 06/15/2013  . Long term (current) use of anticoagulants 02/28/2013  . Atrial flutter with rapid ventricular response 02/15/2013  . Hx-TIA (transient ischemic attack) 02/05/13 02/15/2013  . HTN (hypertension) 02/15/2013  . TIA (transient ischemic attack) 02/05/2013  . Hypertensive urgency 02/05/2013  . Chest pain 02/05/2013  . Hypokalemia 02/05/2013  . Leukocytosis 02/05/2013  . Acute respiratory failure 09/06/2011  . CAP (community acquired pneumonia) 09/06/2011  . Tachycardia 09/06/2011   Atrial flutter with rapid ventricular response - Plan: INR, CBC, COMPLETE METABOLIC PANEL WITH GFR, POCT glycosylated hemoglobin (Hb A1C), Lipid panel, TSH  HTN  (hypertension) - Plan: INR, CBC, COMPLETE METABOLIC PANEL WITH GFR, POCT glycosylated hemoglobin (Hb A1C), Lipid panel, TSH  Hx-TIA (transient ischemic attack) 02/05/13 - Plan: INR, CBC, COMPLETE METABOLIC PANEL WITH GFR, POCT glycosylated hemoglobin (Hb A1C), Lipid panel, TSH  Long term (current) use of anticoagulants - Plan: INR, CBC, COMPLETE METABOLIC PANEL WITH GFR, POCT glycosylated hemoglobin (Hb A1C), Lipid panel, TSH  Tachycardia - Plan: INR, CBC, COMPLETE METABOLIC PANEL WITH GFR, POCT glycosylated hemoglobin (Hb A1C), Lipid panel, TSH  Ex-smoker - Plan: INR, CBC, COMPLETE METABOLIC PANEL WITH GFR, POCT glycosylated hemoglobin (Hb A1C), Lipid panel, TSH  Ventral hernia - Plan: Ambulatory referral to General Surgery, CBC, COMPLETE METABOLIC PANEL WITH GFR, POCT glycosylated hemoglobin (Hb A1C), Lipid panel, TSH  Abdominal pain, unspecified site - Plan: DG Abd 2 Views, CBC, COMPLETE METABOLIC PANEL WITH GFR, POCT glycosylated hemoglobin (Hb A1C), Lipid panel, TSH  Check INR stat today.    Pt hasn't had it  checked in a very long time Warfarin 7.5 mg taken as directed. Cardiology clinic appointment next week.  PT WALKED OUT OF CLINIC WITHOUT HAVING ANY BLOODWORK DONE.   I have asked staff to call patient and have her return ASAP.   RTC for primary care in 1 month  The patient was given clear instructions to go to ER or return to medical center if symptoms don't improve, worsen or new problems develop.  The patient verbalized understanding.  The patient was told to call to get any lab results if not heard anything in the next week.    Rodney Langton, MD, CDE, FAAFP [facility] [place], [place]

## 2013-06-22 ENCOUNTER — Ambulatory Visit: Payer: Self-pay

## 2013-06-22 ENCOUNTER — Ambulatory Visit: Payer: Self-pay | Admitting: Cardiology

## 2013-06-29 ENCOUNTER — Ambulatory Visit: Payer: Self-pay | Attending: Cardiology | Admitting: Cardiology

## 2013-06-29 ENCOUNTER — Encounter: Payer: Self-pay | Admitting: Cardiology

## 2013-06-29 VITALS — BP 135/95 | HR 136 | Temp 98.8°F | Resp 16 | Wt 197.0 lb

## 2013-06-29 DIAGNOSIS — Z7901 Long term (current) use of anticoagulants: Secondary | ICD-10-CM

## 2013-06-29 DIAGNOSIS — Z5181 Encounter for therapeutic drug level monitoring: Secondary | ICD-10-CM

## 2013-06-29 DIAGNOSIS — I10 Essential (primary) hypertension: Secondary | ICD-10-CM

## 2013-06-29 DIAGNOSIS — I4892 Unspecified atrial flutter: Secondary | ICD-10-CM | POA: Insufficient documentation

## 2013-06-29 MED ORDER — FLECAINIDE ACETATE 50 MG PO TABS: 50.0000 mg | ORAL_TABLET | Freq: Two times a day (BID) | ORAL | Status: DC

## 2013-06-29 MED ORDER — FLECAINIDE ACETATE 50 MG PO TABS: 100.0000 mg | ORAL_TABLET | Freq: Two times a day (BID) | ORAL | Status: DC

## 2013-06-29 MED ORDER — METOPROLOL TARTRATE 25 MG PO TABS: 50.0000 mg | ORAL_TABLET | Freq: Two times a day (BID) | ORAL | Status: DC

## 2013-06-29 NOTE — Progress Notes (Signed)
[name] returns today after missing several appointments including having her Coumadin checked for a history of atrial flutter status post cardioversion during transesophageal echocardiography in June. She states that she has been compliant with all her medications though she's not had her Coumadin checked since June. Her last INR was 3.6. She remembers being asymptomatic when she was in atrial flutter.  Her heart rate today was 136 beats per minute. EKG shows atrial flutter with 21 AV conduction. She is a left anterior fascicular block. There are nonspecific ST and T wave changes.  She's in no acute distress. Her vital signs are stable. Exam is unchanged from baseline.

## 2013-06-29 NOTE — Progress Notes (Signed)
Pt is here for a f/u and to review coumadin Wants to know if she still needs to take coumadin Denies any medical problems at the moment Alert w/no signs of acute distress

## 2013-06-29 NOTE — Assessment & Plan Note (Signed)
Metoprolol increased to 50 mg twice a day.

## 2013-06-29 NOTE — Assessment & Plan Note (Addendum)
She is in recurrent asymptomatic atrial flutter with a rapid ventricular rate. I've had a long talk with her today about compliance. We will check an INR today. If it is above 2, will increase her flecainide to 100mg  mg twice a day. I've also increased her metoprolol to 50 mg twice a day. She returns the office next Wednesday, 7 days from now, for followup with me. We will pray she is back in normal rhythm. If she is not, she will need outpatient cardioversion after being therapeutic for 3 or more weeks.  INR is 1.2. We'll not increase flecainide. She's agreed to see me next Wednesday. I've increased her Coumadin to 10 mg a day. We will check a 9 or next Wednesday.

## 2013-07-06 ENCOUNTER — Ambulatory Visit: Payer: Self-pay | Attending: Cardiology | Admitting: Cardiology

## 2013-07-06 ENCOUNTER — Encounter: Payer: Self-pay | Admitting: Cardiology

## 2013-07-06 VITALS — BP 153/114 | HR 123 | Temp 98.3°F | Resp 16

## 2013-07-06 DIAGNOSIS — Z7901 Long term (current) use of anticoagulants: Secondary | ICD-10-CM

## 2013-07-06 DIAGNOSIS — I4892 Unspecified atrial flutter: Secondary | ICD-10-CM | POA: Insufficient documentation

## 2013-07-06 DIAGNOSIS — I10 Essential (primary) hypertension: Secondary | ICD-10-CM

## 2013-07-06 LAB — POCT INR: INR: 1.8

## 2013-07-06 MED ORDER — METOPROLOL TARTRATE 25 MG PO TABS: 75.0000 mg | ORAL_TABLET | Freq: Two times a day (BID) | ORAL | Status: DC

## 2013-07-06 NOTE — Progress Notes (Signed)
D/c instructions given to increase coumadin 10mg  x 1 week and return next week for repeat. Bp med Metoprolol tartrate 50mg  increased to 75 mg Bid

## 2013-07-06 NOTE — Assessment & Plan Note (Signed)
I've increased her Coumadin to 10 mg daily. We'll check INR next Wednesday. When therapeutic, we'll double flecainide dose. Hopefully, we can avoid another electrical cardioversion.

## 2013-07-06 NOTE — Assessment & Plan Note (Signed)
Still below. Increase metoprolol to 75 mg twice a day. We'll most likely have to add another agent.

## 2013-07-06 NOTE — Progress Notes (Signed)
[name] returns today for evaluation and management of her atrial flutter with rapid ventricular rate. I just saw her last week at which time she was back in atrial flutter which is asymptomatic. I increased her metoprolol to 50 mg twice a day. Her INR was only 1.2. INR today is 1.8. Her heart rate is less than 126 beats per minute. Blood pressure still high.  Exam is unchanged.

## 2013-07-06 NOTE — Progress Notes (Signed)
Pt here f/u HTN with prescribed medications INR- 1.8 taking 7.5 mg  Denies CP BP 153/114- taking lopressor 25 mg bid HR 126

## 2013-07-13 ENCOUNTER — Other Ambulatory Visit: Payer: Self-pay

## 2013-07-20 ENCOUNTER — Other Ambulatory Visit: Payer: Self-pay

## 2013-08-24 ENCOUNTER — Ambulatory Visit: Payer: Self-pay | Admitting: Cardiology

## 2013-08-31 ENCOUNTER — Ambulatory Visit: Payer: Self-pay | Admitting: Cardiology

## 2013-10-06 ENCOUNTER — Ambulatory Visit: Payer: Self-pay | Admitting: Pharmacist Clinician (PhC)/ Clinical Pharmacy Specialist

## 2013-10-06 DIAGNOSIS — Z7901 Long term (current) use of anticoagulants: Secondary | ICD-10-CM

## 2013-10-06 DIAGNOSIS — I4892 Unspecified atrial flutter: Secondary | ICD-10-CM

## 2013-10-19 ENCOUNTER — Ambulatory Visit: Payer: Self-pay | Admitting: Cardiology

## 2013-12-21 ENCOUNTER — Other Ambulatory Visit: Payer: Self-pay | Admitting: Cardiology

## 2013-12-23 ENCOUNTER — Telehealth: Payer: Self-pay | Admitting: Emergency Medicine

## 2013-12-23 NOTE — Telephone Encounter (Signed)
Pt called in requesting medication refill Flecainide Acetate 50 mg tab Pt given f/u appt with Dr. Verl Blalock for continue monitoring of medication

## 2014-01-04 ENCOUNTER — Ambulatory Visit: Payer: Self-pay | Admitting: Cardiology

## 2014-02-22 ENCOUNTER — Ambulatory Visit: Payer: Self-pay | Attending: Cardiology | Admitting: Cardiology

## 2014-02-22 ENCOUNTER — Encounter: Payer: Self-pay | Admitting: Cardiology

## 2014-02-22 VITALS — BP 136/101 | HR 115 | Resp 20 | Ht 63.0 in | Wt 209.0 lb

## 2014-02-22 DIAGNOSIS — Z91199 Patient's noncompliance with other medical treatment and regimen due to unspecified reason: Secondary | ICD-10-CM | POA: Insufficient documentation

## 2014-02-22 DIAGNOSIS — Z09 Encounter for follow-up examination after completed treatment for conditions other than malignant neoplasm: Secondary | ICD-10-CM | POA: Insufficient documentation

## 2014-02-22 DIAGNOSIS — I4892 Unspecified atrial flutter: Secondary | ICD-10-CM

## 2014-02-22 DIAGNOSIS — I10 Essential (primary) hypertension: Secondary | ICD-10-CM

## 2014-02-22 DIAGNOSIS — Z9119 Patient's noncompliance with other medical treatment and regimen: Secondary | ICD-10-CM | POA: Insufficient documentation

## 2014-02-22 MED ORDER — METOPROLOL TARTRATE 25 MG PO TABS: 25.0000 mg | ORAL_TABLET | Freq: Two times a day (BID) | ORAL | Status: DC

## 2014-02-22 MED ORDER — ASPIRIN EC 325 MG PO TBEC: 325.0000 mg | DELAYED_RELEASE_TABLET | Freq: Every day | ORAL | Status: DC

## 2014-02-22 MED ORDER — HYDRALAZINE HCL 25 MG PO TABS: 50.0000 mg | ORAL_TABLET | Freq: Three times a day (TID) | ORAL | Status: DC

## 2014-02-22 MED ORDER — CLONIDINE HCL 0.1 MG PO TABS
0.2000 mg | ORAL_TABLET | Freq: Once | ORAL | Status: AC
  Administered 2014-02-22: 0.2 mg via ORAL

## 2014-02-22 NOTE — Patient Instructions (Signed)
Scripts have been sent to [facility]. Please begin taking Aspirin 325 mg daily. Begin taking hydralazine 50 mg three times daily. Metoprolol 25 mg twice daily. Stop taking flecainide and warfarin.  It was great to see you today.  Take care!

## 2014-02-22 NOTE — Assessment & Plan Note (Addendum)
I did not repeat her EKG today but I feel she is in atrial flutter with a rapid rate which is regular. She is totally noncompliant with her flecainide and Coumadin. I'll discontinue both of those. I told her we cannot accept the risk for complications which she is noncompliant. These are both high risk medications. I'll increase her aspirin to 325 mg a day, increase her metoprolol to 25 mg twice a day, increase hydralazine to 50 mg 3 times a day. Her blood pressures extremely high today and we'll give clonidine 0.2 now. I have not arranged followup with cardiology. She needs to remain with primary care with Dr. Doreene Burke. She knows she is at high risk of having another TIA or possible stroke. She is willing to accept this risk and clearly understands after several discussions.

## 2014-02-22 NOTE — Progress Notes (Signed)
[name] returns today after multiple attempts to get her back to the office. Please see my previous notes.  She continues to be noncompliant. She's not taking her Coumadin in fact she is out. She has not come to get regular protimes. We've had long discussions about this in the past. She states that she doesn't take Flecainide  on regular basis as well. She says she did take her hydralazine this morning as well as her metoprolol. It is not clear how frequently she takes either one.  She is totally asymptomatic. Heart rate is 130 and regular. She is no acute distress. Neck veins are normal. Thyroid is not enlarged. No carotid bruits. Lungs are clear. Heart reveals a rapid rate and rhythm. PMI poorly appreciated. Extremities reveal no edema. Pulses are intact. Neuro exam is grossly intact. She has a normal affect and is laid back about her clinical situation.  EKG not repeated.

## 2014-02-22 NOTE — Progress Notes (Signed)
Patient here for follow-up. Patient denies chest pain, dizziness, swelling, headache, and shortness of breath. Indicates she "feels she is always in Atrial flutter" Needs refills of hydralazine and metoprolol. Not taking coumadin. BP elevated-184/118 in right arm, 175/128 in left arm. 0.2 clonidine given.  Addendum-BP rechecked 30 minutes after clonidine administration. BP 136/101, Pulse 115 Patient not complaining of dizziness.

## 2014-03-02 ENCOUNTER — Ambulatory Visit: Payer: Self-pay | Admitting: Internal Medicine

## 2014-03-15 ENCOUNTER — Ambulatory Visit: Payer: Self-pay | Admitting: Internal Medicine

## 2014-03-22 ENCOUNTER — Emergency Department (HOSPITAL_COMMUNITY): Payer: Self-pay

## 2014-03-22 ENCOUNTER — Encounter (HOSPITAL_COMMUNITY): Payer: Self-pay | Admitting: Emergency Medicine

## 2014-03-22 ENCOUNTER — Inpatient Hospital Stay (HOSPITAL_COMMUNITY)
Admission: EM | Admit: 2014-03-22 | Discharge: 2014-03-24 | DRG: 194 | Disposition: A | Discharge status: Home / Self Care | Payer: Self-pay | Attending: Internal Medicine | Admitting: Internal Medicine

## 2014-03-22 DIAGNOSIS — Z7982 Long term (current) use of aspirin: Secondary | ICD-10-CM

## 2014-03-22 DIAGNOSIS — J9600 Acute respiratory failure, unspecified whether with hypoxia or hypercapnia: Secondary | ICD-10-CM

## 2014-03-22 DIAGNOSIS — Z87891 Personal history of nicotine dependence: Secondary | ICD-10-CM

## 2014-03-22 DIAGNOSIS — J189 Pneumonia, unspecified organism: Principal | ICD-10-CM | POA: Insufficient documentation

## 2014-03-22 DIAGNOSIS — I5022 Chronic systolic (congestive) heart failure: Secondary | ICD-10-CM | POA: Diagnosis present

## 2014-03-22 DIAGNOSIS — I509 Heart failure, unspecified: Secondary | ICD-10-CM | POA: Diagnosis present

## 2014-03-22 DIAGNOSIS — F411 Generalized anxiety disorder: Secondary | ICD-10-CM | POA: Diagnosis present

## 2014-03-22 DIAGNOSIS — Z880 Allergy status to penicillin: Secondary | ICD-10-CM

## 2014-03-22 DIAGNOSIS — Z9119 Patient's noncompliance with other medical treatment and regimen: Secondary | ICD-10-CM

## 2014-03-22 DIAGNOSIS — J9601 Acute respiratory failure with hypoxia: Secondary | ICD-10-CM

## 2014-03-22 DIAGNOSIS — D72829 Elevated white blood cell count, unspecified: Secondary | ICD-10-CM

## 2014-03-22 DIAGNOSIS — Z833 Family history of diabetes mellitus: Secondary | ICD-10-CM

## 2014-03-22 DIAGNOSIS — Z91199 Patient's noncompliance with other medical treatment and regimen due to unspecified reason: Secondary | ICD-10-CM

## 2014-03-22 DIAGNOSIS — I10 Essential (primary) hypertension: Secondary | ICD-10-CM

## 2014-03-22 DIAGNOSIS — Z8249 Family history of ischemic heart disease and other diseases of the circulatory system: Secondary | ICD-10-CM

## 2014-03-22 DIAGNOSIS — Z8673 Personal history of transient ischemic attack (TIA), and cerebral infarction without residual deficits: Secondary | ICD-10-CM

## 2014-03-22 HISTORY — DX: Cardiac murmur, unspecified: R01.1

## 2014-03-22 LAB — BASIC METABOLIC PANEL
Anion gap: 15 (ref 5–15)
BUN: 9 mg/dL (ref 6–23)
CALCIUM: 9.3 mg/dL (ref 8.4–10.5)
CO2: 24 mEq/L (ref 19–32)
Chloride: 103 mEq/L (ref 96–112)
Creatinine, Ser: 0.83 mg/dL (ref 0.50–1.10)
GFR, EST NON AFRICAN AMERICAN: 81 mL/min — AB (ref >90)
Glucose, Bld: 127 mg/dL — ABNORMAL HIGH (ref 70–99)
POTASSIUM: 4.7 meq/L (ref 3.7–5.3)
SODIUM: 142 meq/L (ref 137–147)

## 2014-03-22 LAB — CBC
HCT: 42.7 % (ref 36.0–46.0)
Hemoglobin: 13.5 g/dL (ref 12.0–15.0)
MCH: 27.6 pg (ref 26.0–34.0)
MCHC: 31.6 g/dL (ref 30.0–36.0)
MCV: 87.1 fL (ref 78.0–100.0)
Platelets: 184 10*3/uL (ref 150–400)
RBC: 4.9 MIL/uL (ref 3.87–5.11)
RDW: 14.5 % (ref 11.5–15.5)
WBC: 11.2 10*3/uL — ABNORMAL HIGH (ref 4.0–10.5)

## 2014-03-22 LAB — PRO B NATRIURETIC PEPTIDE: Pro B Natriuretic peptide (BNP): 219.5 pg/mL — ABNORMAL HIGH (ref 0–125)

## 2014-03-22 LAB — I-STAT TROPONIN, ED: TROPONIN I, POC: 0 ng/mL (ref 0.00–0.08)

## 2014-03-22 LAB — I-STAT CG4 LACTIC ACID, ED: Lactic Acid, Venous: 2.41 mmol/L — ABNORMAL HIGH (ref 0.5–2.2)

## 2014-03-22 MED ORDER — HYDROCODONE-ACETAMINOPHEN 5-325 MG PO TABS: 1.0000 | ORAL_TABLET | ORAL | Status: DC | PRN

## 2014-03-22 MED ORDER — ACETAMINOPHEN 650 MG RE SUPP: 650.0000 mg | Freq: Four times a day (QID) | RECTAL | Status: DC | PRN

## 2014-03-22 MED ORDER — DEXTROSE 5 % IV SOLN
1.0000 g | INTRAVENOUS | Status: DC
  Administered 2014-03-22 – 2014-03-23 (×2): 1 g via INTRAVENOUS
  Filled 2014-03-22 (×3): qty 10

## 2014-03-22 MED ORDER — POLYETHYLENE GLYCOL 3350 17 G PO PACK
17.0000 g | PACK | Freq: Every day | ORAL | Status: DC | PRN
  Filled 2014-03-22: qty 1

## 2014-03-22 MED ORDER — AZITHROMYCIN 500 MG PO TABS
500.0000 mg | ORAL_TABLET | ORAL | Status: DC
  Administered 2014-03-22 – 2014-03-23 (×2): 500 mg via ORAL
  Filled 2014-03-22 (×3): qty 1

## 2014-03-22 MED ORDER — HEPARIN SODIUM (PORCINE) 5000 UNIT/ML IJ SOLN
5000.0000 [IU] | Freq: Three times a day (TID) | INTRAMUSCULAR | Status: DC
  Administered 2014-03-22 – 2014-03-24 (×6): 5000 [IU] via SUBCUTANEOUS
  Filled 2014-03-22 (×9): qty 1

## 2014-03-22 MED ORDER — METOPROLOL TARTRATE 25 MG PO TABS
25.0000 mg | ORAL_TABLET | Freq: Two times a day (BID) | ORAL | Status: DC
  Administered 2014-03-22 – 2014-03-24 (×4): 25 mg via ORAL
  Filled 2014-03-22 (×5): qty 1

## 2014-03-22 MED ORDER — ACETAMINOPHEN 325 MG PO TABS
650.0000 mg | ORAL_TABLET | Freq: Four times a day (QID) | ORAL | Status: DC | PRN
  Administered 2014-03-23: 650 mg via ORAL
  Filled 2014-03-22: qty 2

## 2014-03-22 MED ORDER — SODIUM CHLORIDE 0.9 % IV SOLN
INTRAVENOUS | Status: DC
  Administered 2014-03-22: 08:00:00 via INTRAVENOUS

## 2014-03-22 MED ORDER — SODIUM CHLORIDE 0.9 % IV SOLN
INTRAVENOUS | Status: AC
  Administered 2014-03-22: 13:00:00 via INTRAVENOUS

## 2014-03-22 MED ORDER — SODIUM CHLORIDE 0.9 % IJ SOLN
3.0000 mL | Freq: Two times a day (BID) | INTRAMUSCULAR | Status: DC
  Administered 2014-03-22 – 2014-03-23 (×3): 3 mL via INTRAVENOUS

## 2014-03-22 MED ORDER — HYDRALAZINE HCL 50 MG PO TABS
50.0000 mg | ORAL_TABLET | Freq: Three times a day (TID) | ORAL | Status: DC
  Administered 2014-03-22 – 2014-03-24 (×6): 50 mg via ORAL
  Filled 2014-03-22 (×8): qty 1

## 2014-03-22 MED ORDER — SODIUM CHLORIDE 0.9 % IJ SOLN: 3.0000 mL | INTRAMUSCULAR | Status: DC | PRN

## 2014-03-22 MED ORDER — VANCOMYCIN HCL IN DEXTROSE 1-5 GM/200ML-% IV SOLN
1000.0000 mg | Freq: Three times a day (TID) | INTRAVENOUS | Status: DC
  Administered 2014-03-22: 1000 mg via INTRAVENOUS
  Filled 2014-03-22 (×3): qty 200

## 2014-03-22 MED ORDER — ONDANSETRON HCL 4 MG PO TABS: 4.0000 mg | ORAL_TABLET | Freq: Four times a day (QID) | ORAL | Status: DC | PRN

## 2014-03-22 MED ORDER — SODIUM CHLORIDE 0.9 % IV SOLN: 250.0000 mL | INTRAVENOUS | Status: DC | PRN

## 2014-03-22 MED ORDER — ONDANSETRON HCL 4 MG/2ML IJ SOLN: 4.0000 mg | Freq: Four times a day (QID) | INTRAMUSCULAR | Status: DC | PRN

## 2014-03-22 MED ORDER — DEXTROSE 5 % IV SOLN
2.0000 g | Freq: Once | INTRAVENOUS | Status: AC
  Administered 2014-03-22: 2 g via INTRAVENOUS
  Filled 2014-03-22: qty 2

## 2014-03-22 MED ORDER — ASPIRIN EC 325 MG PO TBEC
325.0000 mg | DELAYED_RELEASE_TABLET | Freq: Every day | ORAL | Status: DC
  Administered 2014-03-23 – 2014-03-24 (×2): 325 mg via ORAL
  Filled 2014-03-22 (×3): qty 1

## 2014-03-22 NOTE — ED Notes (Signed)
Patient with chest pain, cough and shortness of breath.  Patient states that it started last night.  Patient denies any nausea or vomiting at this time.

## 2014-03-22 NOTE — Progress Notes (Signed)
Received report from ED. Awaiting patient arrival to 3w31

## 2014-03-22 NOTE — H&P (Signed)
[facility] History and Physical  [name] HYQ:[id number] DOB: 07-23-1966 DOA: 03/22/2014  Referring physician: Dr. Caleb Popp PCP: Angelica Chessman, MD   Chief Complaint: SOB and cough  HPI: [name] is a 48 y.o. female  With past medical history of TIA and hypertension that comes in for cough and shortness of breath that started the day prior to admission. She relates her cough has progressively gotten worse to the point where now her chest hurts. She denies any sputum. Any fevers, she does relate her shortness of breath has progressively gotten worse to the point where she can't even walk 100 feet without being short of breath. She denies any fevers, nausea vomiting or diarrhea.  In the ED: A chest x-ray was done that shows an infiltrate with no leukocytosis. She was hypoxic to 88 so we were consulted for further evaluation.   Review of Systems:  Constitutional:  No weight loss, night sweats, Fevers, chills, fatigue.  HEENT:  No headaches, Difficulty swallowing,Tooth/dental problems,Sore throat,  No sneezing, itching, ear ache, nasal congestion, post nasal drip,  Cardio-vascular:  No chest pain, Orthopnea, PND, swelling in lower extremities, anasarca, dizziness, palpitations  GI:  No heartburn, indigestion, abdominal pain, nausea, vomiting, diarrhea, change in bowel habits, loss of appetite  Resp:  No wheezing.No chest wall deformity  Skin:  no rash or lesions.  GU:  no dysuria, change in color of urine, no urgency or frequency. No flank pain.  Musculoskeletal:  No joint pain or swelling. No decreased range of motion. No back pain.  Psych:  No change in mood or affect. No depression or anxiety. No memory loss.   Past Medical History  Diagnosis Date  . Pneumonia   . Paroxysmal a-fib   . Shortness of breath   . Hypertension   . Transient ischemic attack (TIA) 01/2013  . Encounter for long-term (current) use of other medications    Past Surgical History   Procedure Laterality Date  . Tubal ligation    . Tee without cardioversion N/A 02/18/2013    Procedure: TRANSESOPHAGEAL ECHOCARDIOGRAM (TEE);  Surgeon: Sanda Klein, MD;  Location: [place];  Service: Cardiovascular;  Laterality: N/A;  . Cardioversion N/A 02/18/2013    Procedure: CARDIOVERSION;  Surgeon: Sanda Klein, MD;  Location: [facility] ENDOSCOPY;  Service: Cardiovascular;  Laterality: N/A;  . Cardioversion N/A 02/23/2013    Procedure: CARDIOVERSION;  Surgeon: Leonie Man, MD;  Location: [place];  Service: Cardiovascular;  Laterality: N/A;  BESIDE CV   Social History:  reports that she has quit smoking. Her smoking use included Cigars. She quit smokeless tobacco use about a year ago. She reports that she drinks about 2 ounces of alcohol per week. She reports that she does not use illicit drugs.  Allergies  Allergen Reactions  . Penicillins Other (See Comments)    unknown    Family History  Problem Relation Age of Onset  . Hypertension    . Diabetes    . Cancer Mother   . Hypertension Father   . Atrial fibrillation Father   . Peripheral vascular disease Father      Prior to Admission medications   Medication Sig Start Date End Date Taking? Authorizing Provider  aspirin EC 325 MG tablet Take 1 tablet (325 mg total) by mouth daily. 02/22/14  Yes Renella Cunas, MD  hydrALAZINE (APRESOLINE) 25 MG tablet Take 2 tablets (50 mg total) by mouth 3 (three) times daily. 02/22/14  Yes Renella Cunas, MD  metoprolol tartrate (  LOPRESSOR) 25 MG tablet Take 1 tablet (25 mg total) by mouth 2 (two) times daily. 02/22/14  Yes Renella Cunas, MD   Physical Exam: Filed Vitals:   03/22/14 1245  BP: 129/84  Pulse: 68  Temp:   Resp: 18    BP 129/84  Pulse 68  Temp(Src) 98.3 F (36.8 C) (Oral)  Resp 18  Ht 5\' 3"  (1.6 m)  Wt 95.301 kg (210 lb 1.6 oz)  BMI 37.23 kg/m2  SpO2 95%  General:  Appears calm and comfortable Eyes: PERRL, normal lids, irises & conjunctiva ENT: grossly normal  hearing, lips & tongue Neck: no LAD, masses or thyromegaly Cardiovascular: RRR, no m/r/g. No LE edema. Respiratory: Good air movement with crackles on the right Abdomen: soft, ntnd Skin: no rash or induration seen on limited exam Musculoskeletal: grossly normal tone BUE/BLE Psychiatric: grossly normal mood and affect, speech fluent and appropriate Neurologic: grossly non-focal.          Labs on Admission:  Basic Metabolic Panel:  Recent Labs Lab 03/22/14 0805  NA 142  K 4.7  CL 103  CO2 24  GLUCOSE 127*  BUN 9  CREATININE 0.83  CALCIUM 9.3   Liver Function Tests: No results found for this basename: AST, ALT, ALKPHOS, BILITOT, PROT, ALBUMIN,  in the last 168 hours No results found for this basename: LIPASE, AMYLASE,  in the last 168 hours No results found for this basename: AMMONIA,  in the last 168 hours CBC:  Recent Labs Lab 03/22/14 0805  WBC 11.2*  HGB 13.5  HCT 42.7  MCV 87.1  PLT 184   Cardiac Enzymes: No results found for this basename: CKTOTAL, CKMB, CKMBINDEX, TROPONINI,  in the last 168 hours  BNP (last 3 results)  Recent Labs  03/22/14 0805  PROBNP 219.5*   CBG: No results found for this basename: GLUCAP,  in the last 168 hours  Radiological Exams on Admission: Dg Chest 2 View  03/22/2014   CLINICAL DATA:  Chest pain and shortness of breath  EXAM: CHEST  2 VIEW  COMPARISON:  02/15/2013  FINDINGS: Cardiac shadow is within normal limits. The lungs are well aerated bilaterally. Patchy infiltrates are noted in the midportion of both lungs but much more prominent on the right than the left. Followup imaging is recommended. No sizable effusion is seen. No acute bony abnormality is noted.  IMPRESSION: Bilateral infiltrates right greater than left. Followup imaging is recommended.   Electronically Signed   By: Inez Catalina M.D.   On: 03/22/2014 07:23   Ct Chest Wo Contrast  03/22/2014   CLINICAL DATA:  Chest pain.  Shortness of breath.  EXAM: CT CHEST  WITHOUT CONTRAST  TECHNIQUE: Multidetector CT imaging of the chest was performed following the standard protocol without IV contrast.  COMPARISON:  Chest x-ray 03/22/2014 and 02/15/2013.  FINDINGS: Thoracic aorta normal caliber. Mild cardiomegaly. Coronary artery disease. No pericardial effusion.  Shotty mediastinal lymph nodes. The thoracic esophagus is unremarkable.  The large airways are patent. Severe patchy bilateral pulmonary infiltrates are present. The infiltrates are particularly prominent in the upper lobes. Differential diagnosis includes an infectious etiology including bilateral pneumonia including atypical or granulomatous infection, acute pulmonary edema of cardiogenic or noncardiogenic origin,inflammatory pneumonitis, inhalational injury, drug reaction, and hemorrhage.  Visualized upper abdominal structures are unremarkable.  Visualized thyroid unremarkable. No significant axillary lymph nodes are present. Chest wall is unremarkable. No acute bony abnormality.  IMPRESSION: 1. Severe bilateral pulmonary infiltrates. Differential diagnosis includes an infectious etiology  including bilateral pneumonia including atypical or granulomatous infection, acute pulmonary edema of cardiogenic or noncardiogenic origin, inflammatory pneumonitis, inhalational injury, drug reaction, and hemorrhage.  2.  Coronary artery disease.  Mild cardiomegaly.   Electronically Signed   By: Marcello Moores  Register   On: 03/22/2014 08:38    EKG: Independently reviewed. Sinus rhythm left atrial enlargement left axis deviation.  Assessment/Plan Active Problems:   CAP (community acquired pneumonia)   Leukocytosis - She has mild leukocytosis without fever, we'll start her on Rocephin and azithromycin for community-acquired pneumonia get sputum culture, HIV urine Legionella. Tylenol for fevers, blood cultures ordered by emergency room physician.  Code Status: full Family Communication: daughter Disposition Plan: inpatient  Time  spent: 54 minutes  Charlynne Cousins [facility] Pager [phone]  **Disclaimer: This note may have been dictated with voice recognition software. Similar sounding words can inadvertently be transcribed and this note may contain transcription errors which may not have been corrected upon publication of note.**

## 2014-03-22 NOTE — Progress Notes (Signed)
Utilization review completed.  

## 2014-03-22 NOTE — ED Provider Notes (Signed)
CSN: [id number]     Arrival date & time 03/22/14  0093 History   First MD Initiated Contact with Patient 03/22/14 [phone]     Chief Complaint  Patient presents with  . Chest Pain  . Shortness of Breath     (Consider location/radiation/quality/duration/timing/severity/associated sxs/prior Treatment) Patient is a 48 y.o. female presenting with chest pain and shortness of breath. The history is provided by the patient.  Chest Pain Associated symptoms: cough and shortness of breath   Associated symptoms: no abdominal pain, no back pain, no fever and no headache   Shortness of Breath Associated symptoms: chest pain and cough   Associated symptoms: no abdominal pain, no fever, no headaches and no rash    patient was sudden onset of cough and shortness of breath and feeling like a gurgling sound in her lungs. Chest pain only with cough. Patient states symptoms really started today. No dominant pain no nausea vomiting. No persistent chest pain.  Past Medical History  Diagnosis Date  . Pneumonia   . Paroxysmal a-fib   . Shortness of breath   . Hypertension   . Transient ischemic attack (TIA) 01/2013  . Encounter for long-term (current) use of other medications    Past Surgical History  Procedure Laterality Date  . Tubal ligation    . Tee without cardioversion N/A 02/18/2013    Procedure: TRANSESOPHAGEAL ECHOCARDIOGRAM (TEE);  Surgeon: Sanda Klein, MD;  Location: [place];  Service: Cardiovascular;  Laterality: N/A;  . Cardioversion N/A 02/18/2013    Procedure: CARDIOVERSION;  Surgeon: Sanda Klein, MD;  Location: [facility] ENDOSCOPY;  Service: Cardiovascular;  Laterality: N/A;  . Cardioversion N/A 02/23/2013    Procedure: CARDIOVERSION;  Surgeon: Leonie Man, MD;  Location: [place];  Service: Cardiovascular;  Laterality: N/A;  BESIDE CV   Family History  Problem Relation Age of Onset  . Hypertension    . Diabetes     History  Substance Use Topics  . Smoking status: Former Smoker -- 3  years    Types: Cigars  . Smokeless tobacco: Former Systems developer    Quit date: 03/15/2013  . Alcohol Use: 2.0 oz/week    4 drink(s) per week     Comment: twice a wek    OB History   Grav Para Term Preterm Abortions TAB SAB Ect Mult Living                 Review of Systems  Constitutional: Negative for fever.  HENT: Negative for congestion.   Eyes: Negative for photophobia.  Respiratory: Positive for cough and shortness of breath.   Cardiovascular: Positive for chest pain.  Gastrointestinal: Negative for abdominal pain.  Genitourinary: Negative for dysuria.  Musculoskeletal: Negative for back pain.  Skin: Negative for rash.  Neurological: Negative for headaches.  Hematological: Does not bruise/bleed easily.  Psychiatric/Behavioral: Negative for confusion.      Allergies  Penicillins  Home Medications   Prior to Admission medications   Medication Sig Start Date End Date Taking? Authorizing Provider  aspirin EC 325 MG tablet Take 1 tablet (325 mg total) by mouth daily. 02/22/14  Yes Renella Cunas, MD  hydrALAZINE (APRESOLINE) 25 MG tablet Take 2 tablets (50 mg total) by mouth 3 (three) times daily. 02/22/14  Yes Renella Cunas, MD  metoprolol tartrate (LOPRESSOR) 25 MG tablet Take 1 tablet (25 mg total) by mouth 2 (two) times daily. 02/22/14  Yes Renella Cunas, MD   BP 142/83  Pulse 59  Temp(Src) 98 F (  36.7 C) (Oral)  Resp 20  Ht 5\' 3"  (1.6 m)  Wt 208 lb 4.8 oz (94.484 kg)  BMI 36.91 kg/m2  SpO2 94% Physical Exam  Nursing note and vitals reviewed. Constitutional: She is oriented to person, place, and time. She appears well-developed and well-nourished. No distress.  HENT:  Head: Normocephalic and atraumatic.  Mouth/Throat: Oropharynx is clear and moist.  Eyes: Conjunctivae and EOM are normal. Pupils are equal, round, and reactive to light.  Neck: Normal range of motion. Neck supple.  Cardiovascular: Normal rate, regular rhythm and normal heart sounds.   Pulmonary/Chest:  Effort normal. No respiratory distress. She has rales.  Abdominal: Soft. Bowel sounds are normal. There is no tenderness.  Musculoskeletal: Normal range of motion.  Neurological: She is alert and oriented to person, place, and time. No cranial nerve deficit. She exhibits normal muscle tone. Coordination normal.  Skin: Skin is warm. No erythema.    ED Course  Procedures (including critical care time) Labs Review Labs Reviewed  CBC - Abnormal; Notable for the following:    WBC 11.2 (*)    All other components within normal limits  BASIC METABOLIC PANEL - Abnormal; Notable for the following:    Glucose, Bld 127 (*)    GFR calc non Af Amer 81 (*)    All other components within normal limits  PRO B NATRIURETIC PEPTIDE - Abnormal; Notable for the following:    Pro B Natriuretic peptide (BNP) 219.5 (*)    All other components within normal limits  I-STAT CG4 LACTIC ACID, ED - Abnormal; Notable for the following:    Lactic Acid, Venous 2.41 (*)    All other components within normal limits  CULTURE, BLOOD (ROUTINE X 2)  CULTURE, BLOOD (ROUTINE X 2)  I-STAT TROPOININ, ED   Results for orders placed during the hospital encounter of 03/22/14  CBC      Result Value Ref Range   WBC 11.2 (*) 4.0 - 10.5 K/uL   RBC 4.90  3.87 - 5.11 MIL/uL   Hemoglobin 13.5  12.0 - 15.0 g/dL   HCT 42.7  36.0 - 46.0 %   MCV 87.1  78.0 - 100.0 fL   MCH 27.6  26.0 - 34.0 pg   MCHC 31.6  30.0 - 36.0 g/dL   RDW 14.5  11.5 - 15.5 %   Platelets 184  150 - 400 K/uL  BASIC METABOLIC PANEL      Result Value Ref Range   Sodium 142  137 - 147 mEq/L   Potassium 4.7  3.7 - 5.3 mEq/L   Chloride 103  96 - 112 mEq/L   CO2 24  19 - 32 mEq/L   Glucose, Bld 127 (*) 70 - 99 mg/dL   BUN 9  6 - 23 mg/dL   Creatinine, Ser 0.83  0.50 - 1.10 mg/dL   Calcium 9.3  8.4 - 10.5 mg/dL   GFR calc non Af Amer 81 (*) >90 mL/min   GFR calc Af Amer >90  >90 mL/min   Anion gap 15  5 - 15  PRO B NATRIURETIC PEPTIDE      Result Value  Ref Range   Pro B Natriuretic peptide (BNP) 219.5 (*) 0 - 125 pg/mL  I-STAT TROPOININ, ED      Result Value Ref Range   Troponin i, poc 0.00  0.00 - 0.08 ng/mL   Comment 3           I-STAT CG4 LACTIC ACID, ED  Result Value Ref Range   Lactic Acid, Venous 2.41 (*) 0.5 - 2.2 mmol/L    Imaging Review Dg Chest 2 View  03/22/2014   CLINICAL DATA:  Chest pain and shortness of breath  EXAM: CHEST  2 VIEW  COMPARISON:  02/15/2013  FINDINGS: Cardiac shadow is within normal limits. The lungs are well aerated bilaterally. Patchy infiltrates are noted in the midportion of both lungs but much more prominent on the right than the left. Followup imaging is recommended. No sizable effusion is seen. No acute bony abnormality is noted.  IMPRESSION: Bilateral infiltrates right greater than left. Followup imaging is recommended.   Electronically Signed   By: Inez Catalina M.D.   On: 03/22/2014 07:23   Ct Chest Wo Contrast  03/22/2014   CLINICAL DATA:  Chest pain.  Shortness of breath.  EXAM: CT CHEST WITHOUT CONTRAST  TECHNIQUE: Multidetector CT imaging of the chest was performed following the standard protocol without IV contrast.  COMPARISON:  Chest x-ray 03/22/2014 and 02/15/2013.  FINDINGS: Thoracic aorta normal caliber. Mild cardiomegaly. Coronary artery disease. No pericardial effusion.  Shotty mediastinal lymph nodes. The thoracic esophagus is unremarkable.  The large airways are patent. Severe patchy bilateral pulmonary infiltrates are present. The infiltrates are particularly prominent in the upper lobes. Differential diagnosis includes an infectious etiology including bilateral pneumonia including atypical or granulomatous infection, acute pulmonary edema of cardiogenic or noncardiogenic origin,inflammatory pneumonitis, inhalational injury, drug reaction, and hemorrhage.  Visualized upper abdominal structures are unremarkable.  Visualized thyroid unremarkable. No significant axillary lymph nodes are present.  Chest wall is unremarkable. No acute bony abnormality.  IMPRESSION: 1. Severe bilateral pulmonary infiltrates. Differential diagnosis includes an infectious etiology including bilateral pneumonia including atypical or granulomatous infection, acute pulmonary edema of cardiogenic or noncardiogenic origin, inflammatory pneumonitis, inhalational injury, drug reaction, and hemorrhage.  2.  Coronary artery disease.  Mild cardiomegaly.   Electronically Signed   By: Marcello Moores  Register   On: 03/22/2014 08:38     EKG Interpretation   Date/Time:  Wednesday March 22 2014 06:59:28 EDT Ventricular Rate:  95 PR Interval:  152 QRS Duration: 78 QT Interval:  346 QTC Calculation: 434 R Axis:   -67 Text Interpretation:  Sinus rhythm with Premature atrial complexes  Biatrial enlargement Left axis deviation Pulmonary disease pattern  Nonspecific ST and T wave abnormality Abnormal ECG wandering baseline  Confirmed by Brookes Craine  MD, Ilia Dimaano ([id number]) on 03/22/2014 7:17:45 AM      MDM   Final diagnoses:  HCAP (healthcare-associated pneumonia)    Patient will be admitted for development of pneumonia. Patient was negative the hospital beginning of June this will be hospital acquired pneumonia. Patient started on appropriate medications per protocol for that blood cultures were done in case she gets worse. Patient has been allergies she on the pen allergy regimen. Discussed with hospitalist they will admit.   Chest x-ray raises some concerns may be for pulmonary edema but no evidence of that based on BNP you're on CT scan. Patient's chest pain is only related when she coughs. Patient without hypoxia. Room air sats in the low 90s. Sudden onset of the development of the pneumonia being bilateral warts admission.   Fredia Sorrow, MD 03/22/14 1114

## 2014-03-22 NOTE — Progress Notes (Signed)
Pt admitted to the unit at 1235. Pt mental status is a&ox4. Pt oriented to room, staff, and call bell. Skin is intact. Full assessment charted in [facility]. Call bell within reach. Visitor guidelines reviewed w/ pt and/or family.

## 2014-03-22 NOTE — Progress Notes (Signed)
ANTIBIOTIC CONSULT NOTE - INITIAL  Pharmacy Consult for Vancomycin Indication: pneumonia  Allergies  Allergen Reactions  . Penicillins Other (See Comments)    unknown    Patient Measurements: Height: 5\' 3"  (160 cm) Weight: 208 lb 4.8 oz (94.484 kg) IBW/kg (Calculated) : 52.4 Adjusted Body Weight:    Vital Signs: Temp: 98 F (36.7 C) (07/08 0659) Temp src: Oral (07/08 0659) BP: 177/97 mmHg (07/08 0828) Pulse Rate: 76 (07/08 0828) Intake/Output from previous day:   Intake/Output from this shift:    Labs:  Recent Labs  03/22/14 0805  WBC 11.2*  HGB 13.5  PLT 184  CREATININE 0.83   Estimated Creatinine Clearance: 89.6 ml/min (by C-G formula based on Cr of 0.83). No results found for this basename: VANCOTROUGH, VANCOPEAK, VANCORANDOM, GENTTROUGH, GENTPEAK, GENTRANDOM, TOBRATROUGH, TOBRAPEAK, TOBRARND, AMIKACINPEAK, AMIKACINTROU, AMIKACIN,  in the last 72 hours   Microbiology: No results found for this or any previous visit (from the past 720 hour(s)).  Medical History: Past Medical History  Diagnosis Date  . Pneumonia   . Paroxysmal a-fib   . Shortness of breath   . Hypertension   . Transient ischemic attack (TIA) 01/2013  . Encounter for long-term (current) use of other medications     Medications:  See med rec  Assessment: CP, cough, SOB CXR: Severe bilateral pulmonary infiltrates. Start abx for PNA. No MD notes yet.  Labs: Scr 0.83, CrCl 89, WBC 11.2, LA 2.41  Goal of Therapy:  Vancomycin trough level 15-20 mcg/ml  Plan:  Vancomycin 1g IV q8hr. Vanco trough after 3-5 doses at steady state   Xzander Gilham S. Alford Highland, PharmD, BCPS Clinical Staff Pharmacist Pager [phone]  Eilene Ghazi Stillinger 03/22/2014,9:23 AM

## 2014-03-23 DIAGNOSIS — Z87891 Personal history of nicotine dependence: Secondary | ICD-10-CM

## 2014-03-23 LAB — CBC
HEMATOCRIT: 44 % (ref 36.0–46.0)
HEMOGLOBIN: 14.1 g/dL (ref 12.0–15.0)
MCH: 27.8 pg (ref 26.0–34.0)
MCHC: 32 g/dL (ref 30.0–36.0)
MCV: 86.6 fL (ref 78.0–100.0)
Platelets: 181 10*3/uL (ref 150–400)
RBC: 5.08 MIL/uL (ref 3.87–5.11)
RDW: 14.5 % (ref 11.5–15.5)
WBC: 14.9 10*3/uL — AB (ref 4.0–10.5)

## 2014-03-23 LAB — LEGIONELLA ANTIGEN, URINE: LEGIONELLA ANTIGEN, URINE: NEGATIVE

## 2014-03-23 LAB — COMPREHENSIVE METABOLIC PANEL
ALBUMIN: 3.7 g/dL (ref 3.5–5.2)
ALT: 18 U/L (ref 0–35)
AST: 24 U/L (ref 0–37)
Alkaline Phosphatase: 94 U/L (ref 39–117)
Anion gap: 16 — ABNORMAL HIGH (ref 5–15)
BILIRUBIN TOTAL: 1 mg/dL (ref 0.3–1.2)
BUN: 9 mg/dL (ref 6–23)
CO2: 21 mEq/L (ref 19–32)
CREATININE: 0.84 mg/dL (ref 0.50–1.10)
Calcium: 9.4 mg/dL (ref 8.4–10.5)
Chloride: 104 mEq/L (ref 96–112)
GFR calc Af Amer: >90 mL/min (ref >90)
GFR calc non Af Amer: 80 mL/min — ABNORMAL LOW (ref >90)
Glucose, Bld: 112 mg/dL — ABNORMAL HIGH (ref 70–99)
Potassium: 4.2 mEq/L (ref 3.7–5.3)
Sodium: 141 mEq/L (ref 137–147)
TOTAL PROTEIN: 8.4 g/dL — AB (ref 6.0–8.3)

## 2014-03-23 LAB — HIV ANTIBODY (ROUTINE TESTING W REFLEX): HIV: NONREACTIVE

## 2014-03-23 MED ORDER — HYDRALAZINE HCL 20 MG/ML IJ SOLN
10.0000 mg | Freq: Once | INTRAMUSCULAR | Status: AC
  Administered 2014-03-23: 10 mg via INTRAVENOUS
  Filled 2014-03-23: qty 0.5

## 2014-03-23 NOTE — Progress Notes (Signed)
Pt seen and examined. Agree with assessment and plan per Ms. [place], NP. Briefly, pt presents with CAP with WBC slightly elevated today. Tolerating abx. Overall improving clinically. Will monitor overnight. Possible d/c soon if/when WBC improves and if pt remains stable

## 2014-03-23 NOTE — Care Management Note (Addendum)
    Page 1 of 1   03/24/2014     12:12:19 PM CARE MANAGEMENT NOTE 03/24/2014  Patient:  [name], [name]   Account Number:  [id number]  Date Initiated:  03/23/2014  Documentation initiated by:  Tomi Bamberger  Subjective/Objective Assessment:   dx abd pain, cap, aflutter  admit- from home. pta indep.  Has orange card.  [facility].     Action/Plan:   Anticipated DC Date:  03/24/2014   Anticipated DC Plan:  [place]  CM consult      Choice offered to / List presented to:             Status of service:  Completed, signed off Medicare Important Message given?   (If response is "NO", the following Medicare IM given date fields will be blank) Date Medicare IM given:   Medicare IM given by:   Date Additional Medicare IM given:   Additional Medicare IM given by:    Discharge Disposition:  HOME/SELF CARE  Per UR Regulation:  Reviewed for med. necessity/level of care/duration of stay  If discussed at [place] of Stay Meetings, dates discussed:    Comments:  03/24/14 [place], BSN [phone] patient is for dc today.  03/23/14 [place], BSN [phone] patient is from home, pta indep.  Patient states she has an orange card and she has an apt scheduled already at the [facility] on 7/17 at 2:30 pm.  Patient states she does not need medication ast.

## 2014-03-23 NOTE — Progress Notes (Signed)
Pt ambulated in hallway for about 50 feet.  Pt oxygen saturation 98-95% on room air.  Pt denied any feelings of dizziness/ lightheadedness or SOB.  Able to finish walk without any problems.  Will continue to monitor.

## 2014-03-23 NOTE — Progress Notes (Signed)
PROGRESS NOTE  [name] QAS:[id number] DOB: 1965/10/07 DOA: 03/22/2014 PCP: Angelica Chessman, MD  Brief history  48yo AAF with pmh of aflutter with RVR, htn, anxiety and med noncompliance presented to ED on 03/22/14 with complaints of SOB and cough. Symptoms began day prior to admission becoming progressively worse. Nonproductive until this am, now coughing up small amt of blood-tinged sputum. Denies any fever, no orthopnea, no chest pain or palpitations.  Subjective:  Feeling better this am. Slight productive cough    Assessment/Plan:  Dypsnea in setting of CAP -WBC slightly more elevated this am 14.9<--11.2  -blood cultures pending, pt NT appearing, lactic acid only slightly elevated -will continue Rocephin/Azith D2. Recheck CBC in am -ambulated with RN this am with stable O2 sats, no sob  Hypertension -reasonable control -continue home BB, prn hydralazine  Hx aflutter -follows with Dr. Verl Blalock -taken off coumadin and flecainide 02/2014 d/t noncompliance -currently stable. Continue BB  Chronic systolic HF -echo 03/9891 EF 45-50% -compensated on exam. IVFs KVO  Anxiety -stable  DVT Prophylaxis:  SQ heparin  Code Status: full Disposition Plan: possible d/c 7/10   Consultants:  none  Procedures:  none  Antibiotics: Anti-infectives   Start     Dose/Rate Route Frequency Ordered Stop   03/22/14 1600  cefTRIAXone (ROCEPHIN) 1 g in dextrose 5 % 50 mL IVPB     1 g 100 mL/hr over 30 Minutes Intravenous Every 24 hours 03/22/14 1445 03/29/14 1559   03/22/14 1600  azithromycin (ZITHROMAX) tablet 500 mg     500 mg Oral Every 24 hours 03/22/14 1445 03/29/14 1559   03/22/14 1000  vancomycin (VANCOCIN) IVPB 1000 mg/200 mL premix  Status:  Discontinued     1,000 mg 200 mL/hr over 60 Minutes Intravenous Every 8 hours 03/22/14 0926 03/22/14 1445   03/22/14 0900  aztreonam (AZACTAM) 2 g in dextrose 5 % 50 mL IVPB     2 g 100 mL/hr over 30 Minutes Intravenous  Once  03/22/14 0847 03/22/14 1036       Objective: Filed Vitals:   03/23/14 0545 03/23/14 0742 03/23/14 1248 03/23/14 1419  BP: 186/102 152/110  117/75  Pulse:    65  Temp:    98.1 F (36.7 C)  TempSrc:    Oral  Resp:    15  Height:      Weight:      SpO2:   95% 99%    Intake/Output Summary (Last 24 hours) at 03/23/14 1506 Last data filed at 03/23/14 0943  Gross per 24 hour  Intake 591.25 ml  Output    650 ml  Net -58.75 ml   Filed Weights   03/22/14 0659 03/22/14 1245  Weight: 94.484 kg (208 lb 4.8 oz) 95.301 kg (210 lb 1.6 oz)    Exam: General: Well developed, well nourished, NAD, appears stated age   Neck: Supple, no JVD, no masses  Cardiovascular: RRR, S1 S2 auscultated, no rubs, murmurs or gallops.   Respiratory: Clear to auscultation bilaterally with equal chest rise  Abdomen: Soft, nontender, nondistended, + bowel sounds  Extremities: warm dry without cyanosis clubbing or edema.  Neuro: AAOx3, cranial nerves grossly intact. Strength 5/5 in upper and lower extremities  Skin: Without rashes exudates or nodules.   Psych: Normal affect and demeanor with intact judgement and insight   Data Reviewed: Basic Metabolic Panel:  Recent Labs Lab 03/22/14 0805 03/23/14 0615  NA 142 141  K 4.7 4.2  CL 103 104  CO2 24 21  GLUCOSE 127* 112*  BUN 9 9  CREATININE 0.83 0.84  CALCIUM 9.3 9.4   Liver Function Tests:  Recent Labs Lab 03/23/14 0615  AST 24  ALT 18  ALKPHOS 94  BILITOT 1.0  PROT 8.4*  ALBUMIN 3.7   No results found for this basename: LIPASE, AMYLASE,  in the last 168 hours No results found for this basename: AMMONIA,  in the last 168 hours CBC:  Recent Labs Lab 03/22/14 0805 03/23/14 0615  WBC 11.2* 14.9*  HGB 13.5 14.1  HCT 42.7 44.0  MCV 87.1 86.6  PLT 184 181   Cardiac Enzymes: No results found for this basename: CKTOTAL, CKMB, CKMBINDEX, TROPONINI,  in the last 168 hours BNP (last 3 results)  Recent Labs  03/22/14 0805  PROBNP  219.5*   CBG: No results found for this basename: GLUCAP,  in the last 168 hours  Recent Results (from the past 240 hour(s))  CULTURE, BLOOD (ROUTINE X 2)     Status: None   Collection Time    03/22/14  9:30 AM      Result Value Ref Range Status   Specimen Description BLOOD RIGHT ANTECUBITAL   Final   Special Requests BOTTLES DRAWN AEROBIC AND ANAEROBIC 10CC   Final   Culture  Setup Time     Final   Value: 03/22/2014 12:39     Performed at [facility]   Culture     Final   Value:        BLOOD CULTURE RECEIVED NO GROWTH TO DATE CULTURE WILL BE HELD FOR 5 DAYS BEFORE ISSUING A FINAL NEGATIVE REPORT     Performed at [facility]   Report Status PENDING   Incomplete  CULTURE, BLOOD (ROUTINE X 2)     Status: None   Collection Time    03/22/14  9:40 AM      Result Value Ref Range Status   Specimen Description BLOOD RIGHT HAND   Final   Special Requests BOTTLES DRAWN AEROBIC AND ANAEROBIC 6CC   Final   Culture  Setup Time     Final   Value: 03/22/2014 12:39     Performed at [facility]   Culture     Final   Value:        BLOOD CULTURE RECEIVED NO GROWTH TO DATE CULTURE WILL BE HELD FOR 5 DAYS BEFORE ISSUING A FINAL NEGATIVE REPORT     Performed at [facility]   Report Status PENDING   Incomplete     Studies: Dg Chest 2 View  03/22/2014   CLINICAL DATA:  Chest pain and shortness of breath  EXAM: CHEST  2 VIEW  COMPARISON:  02/15/2013  FINDINGS: Cardiac shadow is within normal limits. The lungs are well aerated bilaterally. Patchy infiltrates are noted in the midportion of both lungs but much more prominent on the right than the left. Followup imaging is recommended. No sizable effusion is seen. No acute bony abnormality is noted.  IMPRESSION: Bilateral infiltrates right greater than left. Followup imaging is recommended.   Electronically Signed   By: Inez Catalina M.D.   On: 03/22/2014 07:23   Ct Chest Wo Contrast  03/22/2014   CLINICAL DATA:  Chest  pain.  Shortness of breath.  EXAM: CT CHEST WITHOUT CONTRAST  TECHNIQUE: Multidetector CT imaging of the chest was performed following the standard protocol without IV contrast.  COMPARISON:  Chest x-ray 03/22/2014 and 02/15/2013.  FINDINGS: Thoracic aorta normal caliber. Mild cardiomegaly.  Coronary artery disease. No pericardial effusion.  Shotty mediastinal lymph nodes. The thoracic esophagus is unremarkable.  The large airways are patent. Severe patchy bilateral pulmonary infiltrates are present. The infiltrates are particularly prominent in the upper lobes. Differential diagnosis includes an infectious etiology including bilateral pneumonia including atypical or granulomatous infection, acute pulmonary edema of cardiogenic or noncardiogenic origin,inflammatory pneumonitis, inhalational injury, drug reaction, and hemorrhage.  Visualized upper abdominal structures are unremarkable.  Visualized thyroid unremarkable. No significant axillary lymph nodes are present. Chest wall is unremarkable. No acute bony abnormality.  IMPRESSION: 1. Severe bilateral pulmonary infiltrates. Differential diagnosis includes an infectious etiology including bilateral pneumonia including atypical or granulomatous infection, acute pulmonary edema of cardiogenic or noncardiogenic origin, inflammatory pneumonitis, inhalational injury, drug reaction, and hemorrhage.  2.  Coronary artery disease.  Mild cardiomegaly.   Electronically Signed   By: Marcello Moores  Register   On: 03/22/2014 08:38    Scheduled Meds: . aspirin EC  325 mg Oral Daily  . azithromycin  500 mg Oral Q24H  . cefTRIAXone (ROCEPHIN)  IV  1 g Intravenous Q24H  . heparin  5,000 Units Subcutaneous 3 times per day  . hydrALAZINE  50 mg Oral TID  . metoprolol tartrate  25 mg Oral BID  . sodium chloride  3 mL Intravenous Q12H   Continuous Infusions:  NSKVO  Active Problems:   CAP (community acquired pneumonia)   Leukocytosis   Patrici Ranks, NP-C [place]  pgr [phone]  If 7PM-7AM, please contact night-coverage at [email or web address], password [facility] 03/23/2014, 3:06 PM  LOS: 1 day

## 2014-03-24 LAB — CBC
HCT: 40 % (ref 36.0–46.0)
Hemoglobin: 13.1 g/dL (ref 12.0–15.0)
MCH: 27.6 pg (ref 26.0–34.0)
MCHC: 32.8 g/dL (ref 30.0–36.0)
MCV: 84.2 fL (ref 78.0–100.0)
Platelets: 177 10*3/uL (ref 150–400)
RBC: 4.75 MIL/uL (ref 3.87–5.11)
RDW: 14.2 % (ref 11.5–15.5)
WBC: 13.4 10*3/uL — ABNORMAL HIGH (ref 4.0–10.5)

## 2014-03-24 LAB — BASIC METABOLIC PANEL
Anion gap: 17 — ABNORMAL HIGH (ref 5–15)
BUN: 10 mg/dL (ref 6–23)
CALCIUM: 9.1 mg/dL (ref 8.4–10.5)
CO2: 21 mEq/L (ref 19–32)
Chloride: 102 mEq/L (ref 96–112)
Creatinine, Ser: 0.82 mg/dL (ref 0.50–1.10)
GFR calc Af Amer: >90 mL/min (ref >90)
GFR calc non Af Amer: 83 mL/min — ABNORMAL LOW (ref >90)
GLUCOSE: 97 mg/dL (ref 70–99)
POTASSIUM: 4 meq/L (ref 3.7–5.3)
Sodium: 140 mEq/L (ref 137–147)

## 2014-03-24 MED ORDER — CEFUROXIME AXETIL 250 MG PO TABS: 250.0000 mg | ORAL_TABLET | Freq: Two times a day (BID) | ORAL | Status: DC

## 2014-03-24 MED ORDER — AZITHROMYCIN 500 MG PO TABS: 500.0000 mg | ORAL_TABLET | Freq: Every day | ORAL | Status: DC

## 2014-03-24 MED ORDER — CEFPODOXIME PROXETIL 200 MG PO TABS: 200.0000 mg | ORAL_TABLET | Freq: Two times a day (BID) | ORAL | Status: DC

## 2014-03-24 NOTE — Discharge Summary (Signed)
Pt seen and examined. Agree with assessment and plan per Ms. [place], NP. Briefly, pt presents with CAP, improved with abx. The patient's leukocytosis improved and she remained stable for close outpatient follow up.

## 2014-03-24 NOTE — Discharge Summary (Signed)
Physician Discharge Summary  [name] TKZ:[id number] DOB: Oct 21, 1965 DOA: 03/22/2014  PCP: Angelica Chessman, MD  Admit date: 03/22/2014 Discharge date: 03/24/2014  Time spent: 40 minutes  Recommendations for Outpatient Follow-up:  1. Follow-up PCP 1-2 weeks  Discharge Diagnoses:  Active Problems:   CAP (community acquired pneumonia)   Leukocytosis   Discharge Condition: stable  Diet recommendation: heart healthy  Filed Weights   03/22/14 0659 03/22/14 1245  Weight: 94.484 kg (208 lb 4.8 oz) 95.301 kg (210 lb 1.6 oz)    History of present illness:       48yo AAF with pmh of aflutter with RVR, htn, anxiety and med noncompliance presented to ED on 03/22/14 with complaints of SOB and cough. Symptoms began day prior to admission becoming progressively worse. Nonproductive until this am, now coughing up small amt of blood-tinged sputum. Denies any fever, no orthopnea, no chest pain or palpitations.      Improved steadily throughout hospital stay. She has ambulated hallways without any recurrent dyspnea and with stable O2 sats. She has remained afebrile and WBC has trended down.  Hospital Course:   Dypsnea in setting of CAP  -WBC trending down today 13.4<--14.9 -blood cultures negative to date, pt NT appearing, lactic acid only slightly elevated  -s/p Rocephin/Azith D3. D/c home on ceftin and azith  -ambulated with RN this am with stable O2 sats, no sob   Hypertension  -reasonable control  -continue home BB  Hx aflutter  -follows with Dr. Verl Blalock  -taken off coumadin and flecainide 02/2014 d/t noncompliance  -currently stable. Continue BB   Chronic systolic HF  -echo 01/7321 EF 45-50%  -compensated on exam. IVFs KVO   Anxiety  -stable   Procedures: None  Consultations:  none  Discharge Exam: Filed Vitals:   03/23/14 1248 03/23/14 1419 03/23/14 2050 03/24/14 0519  BP:  117/75 132/77 133/81  Pulse:  65 67 68  Temp:  98.1 F (36.7 C) 98.4 F (36.9 C) 98.8 F (37.1  C)  TempSrc:  Oral Oral Oral  Resp:  15 16 16   Height:      Weight:      SpO2: 95% 99% 98% 97%     General: Well developed, well nourished, NAD, appears stated age  Cardiovascular: RRR, S1 S2 auscultated, no rubs, murmurs or gallops.  Respiratory: Clear to auscultation bilaterally with equal chest rise  Abdomen: Soft, nontender, nondistended, + bowel sounds   Extremities: warm dry without cyanosis clubbing or edema.  Neuro: AAOx3, cranial nerves grossly intact.  Skin: Without rashes exudates or nodules.  Psych: Normal affect and demeanor with intact judgement and insight  Discharge Instructions You were cared for by a hospitalist during your hospital stay. If you have any questions about your discharge medications or the care you received while you were in the hospital after you are discharged, you can call the unit and asked to speak with the hospitalist on call if the hospitalist that took care of you is not available. Once you are discharged, your primary care physician will handle any further medical issues. Please note that NO REFILLS for any discharge medications will be authorized once you are discharged, as it is imperative that you return to your primary care physician (or establish a relationship with a primary care physician if you do not have one) for your aftercare needs so that they can reassess your need for medications and monitor your lab values.     Medication List  aspirin EC 325 MG tablet  Take 1 tablet (325 mg total) by mouth daily.     azithromycin 500 MG tablet  Commonly known as:  ZITHROMAX  Take 1 tablet (500 mg total) by mouth daily.     cefpodoxime 200 MG tablet  Commonly known as:  VANTIN  Take 1 tablet (200 mg total) by mouth 2 (two) times daily.     hydrALAZINE 25 MG tablet  Commonly known as:  APRESOLINE  Take 2 tablets (50 mg total) by mouth 3 (three) times daily.     metoprolol tartrate 25 MG tablet  Commonly known as:  LOPRESSOR   Take 1 tablet (25 mg total) by mouth 2 (two) times daily.       Allergies  Allergen Reactions  . Penicillins Other (See Comments)    unknown   Follow-up Information   Follow up with [facility]     On 03/31/2014. (2:30- patient states she has apt on this date already. )    Contact information:   [place]  [phone]       The results of significant diagnostics from this hospitalization (including imaging, microbiology, ancillary and laboratory) are listed below for reference.    Significant Diagnostic Studies: Dg Chest 2 View  03/22/2014   CLINICAL DATA:  Chest pain and shortness of breath  EXAM: CHEST  2 VIEW  COMPARISON:  02/15/2013  FINDINGS: Cardiac shadow is within normal limits. The lungs are well aerated bilaterally. Patchy infiltrates are noted in the midportion of both lungs but much more prominent on the right than the left. Followup imaging is recommended. No sizable effusion is seen. No acute bony abnormality is noted.  IMPRESSION: Bilateral infiltrates right greater than left. Followup imaging is recommended.   Electronically Signed   By: Inez Catalina M.D.   On: 03/22/2014 07:23   Ct Chest Wo Contrast  03/22/2014   CLINICAL DATA:  Chest pain.  Shortness of breath.  EXAM: CT CHEST WITHOUT CONTRAST  TECHNIQUE: Multidetector CT imaging of the chest was performed following the standard protocol without IV contrast.  COMPARISON:  Chest x-ray 03/22/2014 and 02/15/2013.  FINDINGS: Thoracic aorta normal caliber. Mild cardiomegaly. Coronary artery disease. No pericardial effusion.  Shotty mediastinal lymph nodes. The thoracic esophagus is unremarkable.  The large airways are patent. Severe patchy bilateral pulmonary infiltrates are present. The infiltrates are particularly prominent in the upper lobes. Differential diagnosis includes an infectious etiology including bilateral pneumonia including atypical or granulomatous  infection, acute pulmonary edema of cardiogenic or noncardiogenic origin,inflammatory pneumonitis, inhalational injury, drug reaction, and hemorrhage.  Visualized upper abdominal structures are unremarkable.  Visualized thyroid unremarkable. No significant axillary lymph nodes are present. Chest wall is unremarkable. No acute bony abnormality.  IMPRESSION: 1. Severe bilateral pulmonary infiltrates. Differential diagnosis includes an infectious etiology including bilateral pneumonia including atypical or granulomatous infection, acute pulmonary edema of cardiogenic or noncardiogenic origin, inflammatory pneumonitis, inhalational injury, drug reaction, and hemorrhage.  2.  Coronary artery disease.  Mild cardiomegaly.   Electronically Signed   By: Marcello Moores  Register   On: 03/22/2014 08:38    Microbiology: Recent Results (from the past 240 hour(s))  CULTURE, BLOOD (ROUTINE X 2)     Status: None   Collection Time    03/22/14  9:30 AM      Result Value Ref Range Status   Specimen Description BLOOD RIGHT ANTECUBITAL   Final   Special Requests BOTTLES DRAWN AEROBIC AND ANAEROBIC  10CC   Final   Culture  Setup Time     Final   Value: 03/22/2014 12:39     Performed at [facility]   Culture     Final   Value:        BLOOD CULTURE RECEIVED NO GROWTH TO DATE CULTURE WILL BE HELD FOR 5 DAYS BEFORE ISSUING A FINAL NEGATIVE REPORT     Performed at [facility]   Report Status PENDING   Incomplete  CULTURE, BLOOD (ROUTINE X 2)     Status: None   Collection Time    03/22/14  9:40 AM      Result Value Ref Range Status   Specimen Description BLOOD RIGHT HAND   Final   Special Requests BOTTLES DRAWN AEROBIC AND ANAEROBIC 6CC   Final   Culture  Setup Time     Final   Value: 03/22/2014 12:39     Performed at [facility]   Culture     Final   Value:        BLOOD CULTURE RECEIVED NO GROWTH TO DATE CULTURE WILL BE HELD FOR 5 DAYS BEFORE ISSUING A FINAL NEGATIVE REPORT     Performed at  [facility]   Report Status PENDING   Incomplete     Labs: Basic Metabolic Panel:  Recent Labs Lab 03/22/14 0805 03/23/14 0615 03/24/14 0720  NA 142 141 140  K 4.7 4.2 4.0  CL 103 104 102  CO2 24 21 21   GLUCOSE 127* 112* 97  BUN 9 9 10   CREATININE 0.83 0.84 0.82  CALCIUM 9.3 9.4 9.1   Liver Function Tests:  Recent Labs Lab 03/23/14 0615  AST 24  ALT 18  ALKPHOS 94  BILITOT 1.0  PROT 8.4*  ALBUMIN 3.7   No results found for this basename: LIPASE, AMYLASE,  in the last 168 hours No results found for this basename: AMMONIA,  in the last 168 hours CBC:  Recent Labs Lab 03/22/14 0805 03/23/14 0615 03/24/14 0720  WBC 11.2* 14.9* 13.4*  HGB 13.5 14.1 13.1  HCT 42.7 44.0 40.0  MCV 87.1 86.6 84.2  PLT 184 181 177   Cardiac Enzymes: No results found for this basename: CKTOTAL, CKMB, CKMBINDEX, TROPONINI,  in the last 168 hours BNP: BNP (last 3 results)  Recent Labs  03/22/14 0805  PROBNP 219.5*   CBG: No results found for this basename: GLUCAP,  in the last 168 hours     Signed:  Patrici Ranks, NP-C [facility] [place]  pgr [phone]

## 2014-03-24 NOTE — Progress Notes (Signed)
Pt given discharge instructions, prescriptions and PIV removed. Pt taken to discharge location via wheelchair. 

## 2014-03-24 NOTE — Discharge Instructions (Addendum)
Stroke Prevention Some health problems and behaviors may make it more likely for you to have a stroke. Below are ways to lessen your risk of having a stroke.   Be active for at least 30 minutes on most or all days.  Do not smoke. Try not to be around others who smoke.  Do not drink too much alcohol.  Do not have more than 2 drinks a day if you are a man.  Do not have more than 1 drink a day if you are a woman and are not pregnant.  Eat healthy foods, such as fruits and vegetables. If you were put on a specific diet, follow the diet as told.  Keep your cholesterol levels under control through diet and medicines. Look for foods that are low in saturated fat, trans fat, cholesterol, and are high in fiber.  If you have diabetes, follow all diet plans and take your medicine as told.  If you have high blood pressure (hypertension), follow all diet plans and take your medicine as told.  Keep a healthy weight. Eat foods that are low in calories, salt, saturated fat, trans fat, and cholesterol.  Do not take drugs.  Avoid birth control pills, if this applies. Talk to your doctor about the risks of taking birth control pills.  Talk to your doctor if you have sleep problems (sleep apnea).  Take all medicine as told by your doctor.  You may be told to take aspirin or blood thinner medicine. Take this medicine as told by your doctor.  Understand your medicine instructions.  Make sure any other conditions you have are being taken care of. GET HELP RIGHT AWAY IF:  You suddenly lose feeling (you feel numb) or have weakness in your face, arm, or leg.  Your face or eyelid hangs down to one side.  You suddenly feel confused.  You have trouble talking (aphasia) or understanding what people are saying.  You suddenly have trouble seeing in one or both eyes.  You suddenly have trouble walking.  You are dizzy.  You lose your balance or your movements are clumsy (uncoordinated).  You  suddenly have a very bad headache and you do not know the cause.  You have new chest pain.  Your heart feels like it is fluttering or skipping a beat (irregular heartbeat). Do not wait to see if the symptoms above go away. Get help right away. Call your local emergency services (911 in [place] Do not drive yourself to the hospital. Document Released: 03/02/2012 Document Revised: 06/22/2013 Document Reviewed: 03/04/2013 [facility] Patient Information 2015 [place], [place]. This information is not intended to replace advice given to you by your health care provider. Make sure you discuss any questions you have with your health care provider.   Pneumonia, Adult Pneumonia is an infection of the lungs. It may be caused by a germ (virus or bacteria). Some types of pneumonia can spread easily from person to person. This can happen when you cough or sneeze. HOME CARE  Only take medicine as told by your doctor.  Take your medicine (antibiotics) as told. Finish it even if you start to feel better.  Do not smoke.  You may use a vaporizer or humidifier in your room. This can help loosen thick spit (mucus).  Sleep so you are almost sitting up (semi-upright). This helps reduce coughing.  Rest. A shot (vaccine) can help prevent pneumonia. Shots are often advised for:  People over 48 years old.  Patients on chemotherapy.  People with long-term (chronic) lung problems.  People with immune system problems. GET HELP RIGHT AWAY IF:   You are getting worse.  You cannot control your cough, and you are losing sleep.  You cough up blood.  Your pain gets worse, even with medicine.  You have a fever.  Any of your problems are getting worse, not better.  You have shortness of breath or chest pain. MAKE SURE YOU:   Understand these instructions.  Will watch your condition.  Will get help right away if you are not doing well or get worse. Document Released: 02/18/2008 Document Revised:  11/24/2011 Document Reviewed: 11/22/2010 [facility] Patient Information 2015 [place], [place]. This information is not intended to replace advice given to you by your health care provider. Make sure you discuss any questions you have with your health care provider.

## 2014-03-28 ENCOUNTER — Encounter: Payer: Self-pay | Admitting: Internal Medicine

## 2014-03-28 ENCOUNTER — Other Ambulatory Visit (HOSPITAL_COMMUNITY): Admission: RE | Admit: 2014-03-28 | Payer: MEDICAID | Source: Ambulatory Visit | Admitting: Internal Medicine

## 2014-03-28 ENCOUNTER — Ambulatory Visit: Payer: Self-pay | Attending: Internal Medicine | Admitting: Internal Medicine

## 2014-03-28 VITALS — BP 124/82 | HR 52 | Temp 97.9°F | Resp 16

## 2014-03-28 DIAGNOSIS — D72829 Elevated white blood cell count, unspecified: Secondary | ICD-10-CM | POA: Insufficient documentation

## 2014-03-28 DIAGNOSIS — Z7982 Long term (current) use of aspirin: Secondary | ICD-10-CM | POA: Insufficient documentation

## 2014-03-28 DIAGNOSIS — I10 Essential (primary) hypertension: Secondary | ICD-10-CM | POA: Insufficient documentation

## 2014-03-28 DIAGNOSIS — R0602 Shortness of breath: Secondary | ICD-10-CM | POA: Insufficient documentation

## 2014-03-28 DIAGNOSIS — Z8673 Personal history of transient ischemic attack (TIA), and cerebral infarction without residual deficits: Secondary | ICD-10-CM | POA: Insufficient documentation

## 2014-03-28 DIAGNOSIS — I4891 Unspecified atrial fibrillation: Secondary | ICD-10-CM | POA: Insufficient documentation

## 2014-03-28 DIAGNOSIS — Z880 Allergy status to penicillin: Secondary | ICD-10-CM | POA: Insufficient documentation

## 2014-03-28 DIAGNOSIS — R011 Cardiac murmur, unspecified: Secondary | ICD-10-CM | POA: Insufficient documentation

## 2014-03-28 DIAGNOSIS — Z124 Encounter for screening for malignant neoplasm of cervix: Secondary | ICD-10-CM | POA: Insufficient documentation

## 2014-03-28 LAB — CBC WITH DIFFERENTIAL/PLATELET
Basophils Absolute: 0 10*3/uL (ref 0.0–0.1)
Basophils Relative: 0 % (ref 0–1)
EOS PCT: 2 % (ref 0–5)
Eosinophils Absolute: 0.2 10*3/uL (ref 0.0–0.7)
HCT: 40.6 % (ref 36.0–46.0)
HEMOGLOBIN: 13.2 g/dL (ref 12.0–15.0)
LYMPHS ABS: 2.6 10*3/uL (ref 0.7–4.0)
LYMPHS PCT: 24 % (ref 12–46)
MCH: 27.4 pg (ref 26.0–34.0)
MCHC: 32.5 g/dL (ref 30.0–36.0)
MCV: 84.2 fL (ref 78.0–100.0)
MONO ABS: 0.8 10*3/uL (ref 0.1–1.0)
MONOS PCT: 7 % (ref 3–12)
Neutro Abs: 7.3 10*3/uL (ref 1.7–7.7)
Neutrophils Relative %: 67 % (ref 43–77)
Platelets: 227 10*3/uL (ref 150–400)
RBC: 4.82 MIL/uL (ref 3.87–5.11)
RDW: 15.1 % (ref 11.5–15.5)
WBC: 10.9 10*3/uL — AB (ref 4.0–10.5)

## 2014-03-28 LAB — CULTURE, BLOOD (ROUTINE X 2)
CULTURE: NO GROWTH
Culture: NO GROWTH

## 2014-03-28 NOTE — Patient Instructions (Signed)
DASH Eating Plan DASH stands for "Dietary Approaches to Stop Hypertension." The DASH eating plan is a healthy eating plan that has been shown to reduce high blood pressure (hypertension). Additional health benefits may include reducing the risk of type 2 diabetes mellitus, heart disease, and stroke. The DASH eating plan may also help with weight loss. WHAT DO I NEED TO KNOW ABOUT THE DASH EATING PLAN? For the DASH eating plan, you will follow these general guidelines:  Choose foods with a percent daily value for sodium of less than 5% (as listed on the food label).  Use salt-free seasonings or herbs instead of table salt or sea salt.  Check with your health care provider or pharmacist before using salt substitutes.  Eat lower-sodium products, often labeled as "lower sodium" or "no salt added."  Eat fresh foods.  Eat more vegetables, fruits, and low-fat dairy products.  Choose whole grains. Look for the word "whole" as the first word in the ingredient list.  Choose fish and skinless chicken or turkey more often than red meat. Limit fish, poultry, and meat to 6 oz (170 g) each day.  Limit sweets, desserts, sugars, and sugary drinks.  Choose heart-healthy fats.  Limit cheese to 1 oz (28 g) per day.  Eat more home-cooked food and less restaurant, buffet, and fast food.  Limit fried foods.  Jeanbaptiste foods using methods other than frying.  Limit canned vegetables. If you do use them, rinse them well to decrease the sodium.  When eating at a restaurant, ask that your food be prepared with less salt, or no salt if possible. WHAT FOODS CAN I EAT? Seek help from a dietitian for individual calorie needs. Grains Whole grain or whole wheat bread. Brown rice. Whole grain or whole wheat pasta. Quinoa, bulgur, and whole grain cereals. Low-sodium cereals. Corn or whole wheat flour tortillas. Whole grain cornbread. Whole grain crackers. Low-sodium crackers. Vegetables Fresh or frozen vegetables  (raw, steamed, roasted, or grilled). Low-sodium or reduced-sodium tomato and vegetable juices. Low-sodium or reduced-sodium tomato sauce and paste. Low-sodium or reduced-sodium canned vegetables.  Fruits All fresh, canned (in natural juice), or frozen fruits. Meat and Other Protein Products Ground beef (85% or leaner), grass-fed beef, or beef trimmed of fat. Skinless chicken or turkey. Ground chicken or turkey. Pork trimmed of fat. All fish and seafood. Eggs. Dried beans, peas, or lentils. Unsalted nuts and seeds. Unsalted canned beans. Dairy Low-fat dairy products, such as skim or 1% milk, 2% or reduced-fat cheeses, low-fat ricotta or cottage cheese, or plain low-fat yogurt. Low-sodium or reduced-sodium cheeses. Fats and Oils Tub margarines without trans fats. Light or reduced-fat mayonnaise and salad dressings (reduced sodium). Avocado. Safflower, olive, or canola oils. Natural peanut or almond butter. Other Unsalted popcorn and pretzels. The items listed above may not be a complete list of recommended foods or beverages. Contact your dietitian for more options. WHAT FOODS ARE NOT RECOMMENDED? Grains White bread. White pasta. White rice. Refined cornbread. Bagels and croissants. Crackers that contain trans fat. Vegetables Creamed or fried vegetables. Vegetables in a cheese sauce. Regular canned vegetables. Regular canned tomato sauce and paste. Regular tomato and vegetable juices. Fruits Dried fruits. Canned fruit in light or heavy syrup. Fruit juice. Meat and Other Protein Products Fatty cuts of meat. Ribs, chicken wings, bacon, sausage, bologna, salami, chitterlings, fatback, hot dogs, bratwurst, and packaged luncheon meats. Salted nuts and seeds. Canned beans with salt. Dairy Whole or 2% milk, cream, half-and-half, and cream cheese. Whole-fat or sweetened yogurt. Full-fat   cheeses or blue cheese. Nondairy creamers and whipped toppings. Processed cheese, cheese spreads, or cheese  curds. Condiments Onion and garlic salt, seasoned salt, table salt, and sea salt. Canned and packaged gravies. Worcestershire sauce. Tartar sauce. Barbecue sauce. Teriyaki sauce. Soy sauce, including reduced sodium. Steak sauce. Fish sauce. Oyster sauce. Cocktail sauce. Horseradish. Ketchup and mustard. Meat flavorings and tenderizers. Bouillon cubes. Hot sauce. Tabasco sauce. Marinades. Taco seasonings. Relishes. Fats and Oils Butter, stick margarine, lard, shortening, ghee, and bacon fat. Coconut, palm kernel, or palm oils. Regular salad dressings. Other Pickles and olives. Salted popcorn and pretzels. The items listed above may not be a complete list of foods and beverages to avoid. Contact your dietitian for more information. WHERE CAN I FIND MORE INFORMATION? National Heart, Lung, and Blood Institute: travelstabloid.com Document Released: 08/21/2011 Document Revised: 09/06/2013 Document Reviewed: 07/06/2013 [facility] Patient Information 2015 [place], [place]. This information is not intended to replace advice given to you by your health care provider. Make sure you discuss any questions you have with your health care provider. Hypertension Hypertension, commonly called high blood pressure, is when the force of blood pumping through your arteries is too strong. Your arteries are the blood vessels that carry blood from your heart throughout your body. A blood pressure reading consists of a higher number over a lower number, such as 110/72. The higher number (systolic) is the pressure inside your arteries when your heart pumps. The lower number (diastolic) is the pressure inside your arteries when your heart relaxes. Ideally you want your blood pressure below 120/80. Hypertension forces your heart to work harder to pump blood. Your arteries may become narrow or stiff. Having hypertension puts you at risk for heart disease, stroke, and other problems.  RISK  FACTORS Some risk factors for high blood pressure are controllable. Others are not.  Risk factors you cannot control include:   Race. You may be at higher risk if you are African American.  Age. Risk increases with age.  Gender. Men are at higher risk than women before age 46 years. After age 78, women are at higher risk than men. Risk factors you can control include:  Not getting enough exercise or physical activity.  Being overweight.  Getting too much fat, sugar, calories, or salt in your diet.  Drinking too much alcohol. SIGNS AND SYMPTOMS Hypertension does not usually cause signs or symptoms. Extremely high blood pressure (hypertensive crisis) may cause headache, anxiety, shortness of breath, and nosebleed. DIAGNOSIS  To check if you have hypertension, your health care provider will measure your blood pressure while you are seated, with your arm held at the level of your heart. It should be measured at least twice using the same arm. Certain conditions can cause a difference in blood pressure between your right and left arms. A blood pressure reading that is higher than normal on one occasion does not mean that you need treatment. If one blood pressure reading is high, ask your health care provider about having it checked again. TREATMENT  Treating high blood pressure includes making lifestyle changes and possibly taking medication. Living a healthy lifestyle can help lower high blood pressure. You may need to change some of your habits. Lifestyle changes may include:  Following the DASH diet. This diet is high in fruits, vegetables, and whole grains. It is low in salt, red meat, and added sugars.  Getting at least 2 1/2 hours of brisk physical activity every week.  Losing weight if necessary.  Not smoking.  Limiting alcoholic beverages.  Learning ways to reduce stress. If lifestyle changes are not enough to get your blood pressure under control, your health care provider  may prescribe medicine. You may need to take more than one. Work closely with your health care provider to understand the risks and benefits. HOME CARE INSTRUCTIONS  Have your blood pressure rechecked as directed by your health care provider.   Only take medicine as directed by your health care provider. Follow the directions carefully. Blood pressure medicines must be taken as prescribed. The medicine does not work as well when you skip doses. Skipping doses also puts you at risk for problems.   Do not smoke.   Monitor your blood pressure at home as directed by your health care provider. SEEK MEDICAL CARE IF:   You think you are having a reaction to medicines taken.  You have recurrent headaches or feel dizzy.  You have swelling in your ankles.  You have trouble with your vision. SEEK IMMEDIATE MEDICAL CARE IF:  You develop a severe headache or confusion.  You have unusual weakness, numbness, or feel faint.  You have severe chest or abdominal pain.  You vomit repeatedly.  You have trouble breathing. MAKE SURE YOU:   Understand these instructions.  Will watch your condition.  Will get help right away if you are not doing well or get worse. Document Released: 09/01/2005 Document Revised: 09/06/2013 Document Reviewed: 06/24/2013 [facility] Patient Information 2015 [place], [place]. This information is not intended to replace advice given to you by your health care provider. Make sure you discuss any questions you have with your health care provider.

## 2014-03-28 NOTE — Progress Notes (Signed)
Patient ID: [name], female   DOB: 1966-06-09, 48 y.o.   MRN: [id number]   [name], is a 48 y.o. female  [id number]  [id number]  DOB - Jun 16, 1966  Chief Complaint  Patient presents with  . Follow-up        Subjective:   [name] is a 48 y.o. female here today for a follow up visit. Patient has history of paroxysmal atrial fibrillation/flutter s/p cardioversion in 2014 (on daily ASA and no longer on Flecainide and coumadin), normal left ventricular ejection fraction with severe LVH and mod Pulm HTN, TIA, and hypertension here for followup hospital admission and for Pap smear. Patient was recently seen in the ER for cough and shortness of breath, she was evaluated and admitted for pneumonia with hypoxemia and leukocytosis. She was appropriately managed and discharged to be followed up in our clinic today. Pt denies any symptoms. No orthopnea, PND , LE edema , DOE , chest pain, focal weakness, syncope, bleeding diathesis , claudication , palpitation etc . Reports medication compliance. Patient has No headache, No chest pain, No abdominal pain - No Nausea, No new weakness tingling or numbness, No Cough - SOB.  Problem  Pap Smear for Cervical Cancer Screening  Leucocytosis  Hypertension, Essential, Benign    ALLERGIES: Allergies  Allergen Reactions  . Penicillins Other (See Comments)    unknown    PAST MEDICAL HISTORY: Past Medical History  Diagnosis Date  . Paroxysmal a-fib   . Shortness of breath   . Hypertension   . Transient ischemic attack (TIA) 01/2013  . Encounter for long-term (current) use of other medications   . Heart murmur   . Pneumonia 02/2011; 08/2011; 03/22/2014    MEDICATIONS AT HOME: Prior to Admission medications   Medication Sig Start Date End Date Taking? Authorizing Provider  aspirin EC 325 MG tablet Take 1 tablet (325 mg total) by mouth daily. 02/22/14   Renella Cunas, MD  azithromycin (ZITHROMAX) 500 MG tablet Take 1 tablet (500 mg total)  by mouth daily. 03/24/14   Dionne Milo, NP  cefUROXime (CEFTIN) 250 MG tablet Take 1 tablet (250 mg total) by mouth 2 (two) times daily with a meal. 03/24/14   Dionne Milo, NP  hydrALAZINE (APRESOLINE) 25 MG tablet Take 2 tablets (50 mg total) by mouth 3 (three) times daily. 02/22/14   Renella Cunas, MD  metoprolol tartrate (LOPRESSOR) 25 MG tablet Take 1 tablet (25 mg total) by mouth 2 (two) times daily. 02/22/14   Renella Cunas, MD     Objective:   Filed Vitals:   03/28/14 1056  BP: 124/82  Pulse: 52  Temp: 97.9 F (36.6 C)  TempSrc: Oral  Resp: 16  SpO2: 98%    Exam General appearance : Awake, alert, not in any distress. Speech Clear. Not toxic looking HEENT: Atraumatic and Normocephalic, pupils equally reactive to light and accomodation Neck: supple, no JVD. No cervical lymphadenopathy.  Chest:Good air entry bilaterally, no added sounds  CVS: S1 S2 regular, no murmurs.  Abdomen: Bowel sounds present, Non tender and not distended with no gaurding, rigidity or rebound. Extremities: B/L Lower Ext shows no edema, both legs are warm to touch Neurology: Awake alert, and oriented X 3, CN II-XII intact, Non focal Pelvic examination: Normal female external genitalia, central cervix, negative cervical motion tenderness  Data Review Lab Results  Component Value Date   HGBA1C 5.7* 02/06/2013   HGBA1C 5.7* 02/05/2013   HGBA1C 6.6* 03/07/2011  Assessment & Plan   1. Pap smear for cervical cancer screening  - Cytology - PAP - Cervicovaginal ancillary only  2. Leucocytosis Repeat - CBC with Differential - DG Chest 2 View; Future  3. Hypertension, essential, benign: Controlled Continue hydralazine and metoprolol at the current doses DASH diet We discussed blood pressure goals continues to be compliant with medications Patient was extensively counseled on nutrition and exercise  Return in about 3 months (around 06/28/2014), or if symptoms worsen or fail to  improve, for Follow up HTN, Routine Follow Up.  The patient was given clear instructions to go to ER or return to medical center if symptoms don't improve, worsen or new problems develop. The patient verbalized understanding. The patient was told to call to get lab results if they haven't heard anything in the next week.   This note has been created with Surveyor, quantity. Any transcriptional errors are unintentional.    Angelica Chessman, MD, [place], [place], [place] and [place], [place]   03/28/2014, 12:14 PM

## 2014-03-28 NOTE — Progress Notes (Signed)
HFU Pt following up on her pneumonia w/ fluid in the lungs. Pt is requesting a Pap smear.

## 2014-03-29 ENCOUNTER — Telehealth: Payer: Self-pay | Admitting: Emergency Medicine

## 2014-03-29 NOTE — Telephone Encounter (Signed)
Message copied by Ricci Barker on Wed Mar 29, 2014 12:31 PM ------      Message from: [name]      Created: Tue Mar 28, 2014  6:04 PM       Please inform patient that her white blood cell count has come down towards normal, which signifies that there is improvement in her pneumonia ------

## 2014-03-29 NOTE — Telephone Encounter (Signed)
Left message for pt to call for lab results 

## 2014-03-30 LAB — CYTOLOGY - PAP

## 2014-04-03 ENCOUNTER — Telehealth: Payer: Self-pay | Admitting: Emergency Medicine

## 2014-04-03 NOTE — Telephone Encounter (Signed)
Message copied by Ricci Barker on Mon Apr 03, 2014  5:44 PM ------      Message from: [name]      Created: Mon Apr 03, 2014  4:48 PM       Please inform patient that her Pap smear result is negative for malignancy ------

## 2014-04-03 NOTE — Telephone Encounter (Signed)
Left message for pt to call for pap smear results

## 2014-04-04 ENCOUNTER — Other Ambulatory Visit: Payer: Self-pay | Admitting: Emergency Medicine

## 2014-04-04 ENCOUNTER — Telehealth: Payer: Self-pay | Admitting: Emergency Medicine

## 2014-04-04 MED ORDER — METRONIDAZOLE 500 MG PO TABS: 500.0000 mg | ORAL_TABLET | Freq: Two times a day (BID) | ORAL | Status: DC

## 2014-04-04 NOTE — Telephone Encounter (Signed)
Pt given pap smear results. Verbalized understanding. Pt states she will pick up medication for BV on Thursday

## 2014-04-07 ENCOUNTER — Ambulatory Visit: Payer: MEDICAID

## 2014-04-10 ENCOUNTER — Ambulatory Visit: Payer: Self-pay | Attending: Internal Medicine

## 2014-04-10 DIAGNOSIS — Z0000 Encounter for general adult medical examination without abnormal findings: Secondary | ICD-10-CM

## 2014-04-11 ENCOUNTER — Other Ambulatory Visit: Payer: Self-pay

## 2014-04-11 ENCOUNTER — Ambulatory Visit (HOSPITAL_COMMUNITY)
Admission: RE | Admit: 2014-04-11 | Discharge: 2014-04-11 | Disposition: A | Discharge status: Home / Self Care | Payer: Self-pay | Source: Ambulatory Visit | Attending: Cardiology | Admitting: Cardiology

## 2014-04-11 ENCOUNTER — Ambulatory Visit: Payer: Self-pay | Attending: Internal Medicine | Admitting: Internal Medicine

## 2014-04-11 ENCOUNTER — Encounter: Payer: Self-pay | Admitting: Internal Medicine

## 2014-04-11 ENCOUNTER — Inpatient Hospital Stay (HOSPITAL_COMMUNITY)
Admission: EM | Admit: 2014-04-11 | Discharge: 2014-04-12 | DRG: 310 | Disposition: A | Discharge status: Home / Self Care | Payer: Self-pay | Attending: Internal Medicine | Admitting: Internal Medicine

## 2014-04-11 ENCOUNTER — Encounter (HOSPITAL_COMMUNITY): Payer: Self-pay | Admitting: Emergency Medicine

## 2014-04-11 VITALS — BP 132/90 | HR 133 | Temp 98.2°F | Resp 20 | Ht 63.5 in | Wt 204.6 lb

## 2014-04-11 DIAGNOSIS — Z7901 Long term (current) use of anticoagulants: Secondary | ICD-10-CM

## 2014-04-11 DIAGNOSIS — E663 Overweight: Secondary | ICD-10-CM | POA: Diagnosis present

## 2014-04-11 DIAGNOSIS — Z8673 Personal history of transient ischemic attack (TIA), and cerebral infarction without residual deficits: Secondary | ICD-10-CM

## 2014-04-11 DIAGNOSIS — I2789 Other specified pulmonary heart diseases: Secondary | ICD-10-CM | POA: Diagnosis present

## 2014-04-11 DIAGNOSIS — Z87891 Personal history of nicotine dependence: Secondary | ICD-10-CM | POA: Insufficient documentation

## 2014-04-11 DIAGNOSIS — I4892 Unspecified atrial flutter: Principal | ICD-10-CM | POA: Diagnosis present

## 2014-04-11 DIAGNOSIS — I4891 Unspecified atrial fibrillation: Secondary | ICD-10-CM | POA: Diagnosis present

## 2014-04-11 DIAGNOSIS — Z880 Allergy status to penicillin: Secondary | ICD-10-CM

## 2014-04-11 DIAGNOSIS — I482 Chronic atrial fibrillation, unspecified: Secondary | ICD-10-CM | POA: Diagnosis present

## 2014-04-11 DIAGNOSIS — R000 Tachycardia, unspecified: Secondary | ICD-10-CM | POA: Insufficient documentation

## 2014-04-11 DIAGNOSIS — Z79899 Other long term (current) drug therapy: Secondary | ICD-10-CM

## 2014-04-11 DIAGNOSIS — Z833 Family history of diabetes mellitus: Secondary | ICD-10-CM

## 2014-04-11 DIAGNOSIS — Z9119 Patient's noncompliance with other medical treatment and regimen: Secondary | ICD-10-CM

## 2014-04-11 DIAGNOSIS — I10 Essential (primary) hypertension: Secondary | ICD-10-CM | POA: Insufficient documentation

## 2014-04-11 DIAGNOSIS — Z91199 Patient's noncompliance with other medical treatment and regimen due to unspecified reason: Secondary | ICD-10-CM | POA: Insufficient documentation

## 2014-04-11 DIAGNOSIS — Z7982 Long term (current) use of aspirin: Secondary | ICD-10-CM | POA: Insufficient documentation

## 2014-04-11 DIAGNOSIS — Z9851 Tubal ligation status: Secondary | ICD-10-CM

## 2014-04-11 LAB — BASIC METABOLIC PANEL
Anion gap: 14 (ref 5–15)
BUN: 12 mg/dL (ref 6–23)
CHLORIDE: 105 meq/L (ref 96–112)
CO2: 25 mEq/L (ref 19–32)
Calcium: 8.9 mg/dL (ref 8.4–10.5)
Creatinine, Ser: 1.14 mg/dL — ABNORMAL HIGH (ref 0.50–1.10)
GFR, EST AFRICAN AMERICAN: 64 mL/min — AB (ref >90)
GFR, EST NON AFRICAN AMERICAN: 56 mL/min — AB (ref >90)
GLUCOSE: 101 mg/dL — AB (ref 70–99)
POTASSIUM: 5.1 meq/L (ref 3.7–5.3)
SODIUM: 144 meq/L (ref 137–147)

## 2014-04-11 LAB — PROTIME-INR
INR: 1.07 (ref 0.00–1.49)
PROTHROMBIN TIME: 13.9 s (ref 11.6–15.2)

## 2014-04-11 LAB — CBC WITH DIFFERENTIAL/PLATELET
BASOS ABS: 0 10*3/uL (ref 0.0–0.1)
Basophils Relative: 0 % (ref 0–1)
EOS PCT: 1 % (ref 0–5)
Eosinophils Absolute: 0.1 10*3/uL (ref 0.0–0.7)
HCT: 45.1 % (ref 36.0–46.0)
Hemoglobin: 14.5 g/dL (ref 12.0–15.0)
LYMPHS PCT: 21 % (ref 12–46)
Lymphs Abs: 2.2 10*3/uL (ref 0.7–4.0)
MCH: 28.1 pg (ref 26.0–34.0)
MCHC: 32.2 g/dL (ref 30.0–36.0)
MCV: 87.4 fL (ref 78.0–100.0)
Monocytes Absolute: 0.5 10*3/uL (ref 0.1–1.0)
Monocytes Relative: 5 % (ref 3–12)
NEUTROS ABS: 7.8 10*3/uL — AB (ref 1.7–7.7)
Neutrophils Relative %: 73 % (ref 43–77)
Platelets: 135 10*3/uL — ABNORMAL LOW (ref 150–400)
RBC: 5.16 MIL/uL — ABNORMAL HIGH (ref 3.87–5.11)
RDW: 14.3 % (ref 11.5–15.5)
WBC: 10.7 10*3/uL — AB (ref 4.0–10.5)

## 2014-04-11 LAB — TROPONIN I

## 2014-04-11 MED ORDER — HEPARIN (PORCINE) IN NACL 100-0.45 UNIT/ML-% IJ SOLN
1050.0000 [IU]/h | INTRAMUSCULAR | Status: AC
  Administered 2014-04-11: 1050 [IU]/h via INTRAVENOUS
  Filled 2014-04-11 (×2): qty 250

## 2014-04-11 MED ORDER — DILTIAZEM LOAD VIA INFUSION
15.0000 mg | Freq: Once | INTRAVENOUS | Status: AC
  Administered 2014-04-11: 15 mg via INTRAVENOUS
  Filled 2014-04-11: qty 15

## 2014-04-11 MED ORDER — DILTIAZEM HCL 100 MG IV SOLR
5.0000 mg/h | INTRAVENOUS | Status: DC
  Administered 2014-04-11: 5 mg/h via INTRAVENOUS
  Administered 2014-04-11 – 2014-04-12 (×2): 15 mg/h via INTRAVENOUS
  Filled 2014-04-11: qty 100

## 2014-04-11 MED ORDER — HEPARIN BOLUS VIA INFUSION
4000.0000 [IU] | Freq: Once | INTRAVENOUS | Status: AC
  Administered 2014-04-11: 4000 [IU] via INTRAVENOUS
  Filled 2014-04-11: qty 4000

## 2014-04-11 MED ORDER — METOPROLOL TARTRATE 1 MG/ML IV SOLN
5.0000 mg | Freq: Once | INTRAVENOUS | Status: AC
  Administered 2014-04-11: 5 mg via INTRAVENOUS
  Filled 2014-04-11: qty 5

## 2014-04-11 NOTE — ED Notes (Signed)
[facility] and was discovered to be in a-flutter rate 131. Hx of same and hx of cardioversion last year. Pt denies any complaints. Pt is non compliant with home meds.

## 2014-04-11 NOTE — H&P (Signed)
Cardiology Consultation Note  Patient ID: [name], MRN: [id number], DOB/AGE: 1966-01-17 48 y.o. Admit date: 04/11/2014   Date of Consult: 04/11/2014 Primary Physician: Angelica Chessman, MD Primary Cardiologist: Glenetta Hew and Dr Croitoru   Chief Complaint: Aflutter      Assessment and Plan:  Atrial flutter  HTN with severe LVH on echo and Pulm HTN  Hx of TIA   Plan  -Cont Dilt gtt and Heparin gtt  -Will  need  better BP Control , start HCTZ  -? If inflitrates on the CT chest from 03/22/2014 were from  [facility] , consider repeat Chest xray to ensure resolution  -NPO for possible TEE cardioversion in the am. Pt explained that she will need [facility] for atleast 1 mth thereafter   HPI: 48 year old overweight African American female with a history of paroxysmal atrial fibrillation/flutter s/p cardioversion in 2014 (on daily ASA and no longer on Flecainide and coumadin),nl EF with severe LVH and mod Pulm HTN,   TIA, and hypertension here with Aflutter with RVR .  Pt was at a physical exam when she was noticed to have tachycardia and was sent to the ER where she was found to be in [place] with RVR. Pt denies any symptoms . No  orthopnea, PND , LE edema , DOE , chest pain, focal weakness, syncope, bleeding diathesis , claudication , palpitation etc .  Reports medication compliance . However, unable to do INR checks for outpt coumadin . States her Flecainide was stopped by her cardiologist since ' was not working' . Pt also recently recovered from pulmonary infection for which she had CT chest done in the ER and was given antibiotic course    Past Medical History  Diagnosis Date  . Paroxysmal a-fib   . Shortness of breath   . Hypertension   . Transient ischemic attack (TIA) 01/2013  . Encounter for long-term (current) use of other medications   . Heart murmur   . Pneumonia 02/2011; 08/2011; 03/22/2014      Most Recent Cardiac Studies: TEE 02/18/13 FINDINGS:  Dilated right  atrium  Left atrium normal in size.  No LA thrombus was identified.  Low appendage emptying velocities.  LVEF 45%, mild global hypokinesis  2+ AI, trivial MR, 2+ TR, unable to get good TR jet alignment for PA pressure estimation.  TTE 02/05/2013 Left ventricle: The cavity size was normal. Wall thickness was increased in a pattern of severe LVH. Systolic function was normal. The estimated ejection fraction was in the range of 55% to 60%. Wall motion was normal; there were no regional wall motion abnormalities. PASP 54      Surgical History:  Past Surgical History  Procedure Laterality Date  . Tee without cardioversion N/A 02/18/2013    Procedure: TRANSESOPHAGEAL ECHOCARDIOGRAM (TEE);  Surgeon: Sanda Klein, MD;  Location: [place];  Service: Cardiovascular;  Laterality: N/A;  . Cardioversion N/A 02/18/2013    Procedure: CARDIOVERSION;  Surgeon: Sanda Klein, MD;  Location: [facility] ENDOSCOPY;  Service: Cardiovascular;  Laterality: N/A;  . Cardioversion N/A 02/23/2013    Procedure: CARDIOVERSION;  Surgeon: Leonie Man, MD;  Location: [place];  Service: Cardiovascular;  Laterality: N/A;  BESIDE CV  . Tubal ligation  1992     Home Meds: Prior to Admission medications   Medication Sig Start Date End Date Taking? Authorizing Provider  aspirin EC 325 MG tablet Take 1 tablet (325 mg total) by mouth daily. 02/22/14  Yes Renella Cunas, MD  hydrALAZINE (APRESOLINE) 25  MG tablet Take 2 tablets (50 mg total) by mouth 3 (three) times daily. 02/22/14  Yes Renella Cunas, MD  metoprolol tartrate (LOPRESSOR) 25 MG tablet Take 1 tablet (25 mg total) by mouth 2 (two) times daily. 02/22/14  Yes Renella Cunas, MD    Inpatient Medications:    . diltiazem (CARDIZEM) infusion 15 mg/hr (04/11/14 2239)  . heparin 1,050 Units/hr (04/11/14 2202)    Allergies:  Allergies  Allergen Reactions  . Penicillins Other (See Comments)    unknown    History   Social History  . Marital Status: Single     Spouse Name: N/A    Number of Children: N/A  . Years of Education: N/A   Occupational History  . assembly line    Social History Main Topics  . Smoking status: Former Smoker -- 3 years    Types: Cigars    Quit date: 03/15/2013  . Smokeless tobacco: Never Used  . Alcohol Use: 3.6 oz/week    6 Cans of beer per week  . Drug Use: No  . Sexual Activity: Not Currently   Other Topics Concern  . Not on file   Social History Narrative  . No narrative on file     Family History  Problem Relation Age of Onset  . Hypertension    . Diabetes    . Cancer Mother   . Hypertension Father   . Atrial fibrillation Father   . Peripheral vascular disease Father      Review of Systems: General: negative for chills, fever, night sweats or weight changes.  Cardiovascular:per HPI  Dermatological: negative for rash Respiratory: negative for cough or wheezing Urologic: negative for hematuria Abdominal: negative for nausea, vomiting, diarrhea, bright red blood per rectum, melena, or hematemesis Neurologic: negative for visual changes, syncope, or dizziness All other systems reviewed and are otherwise negative except as noted above.  Labs:  Recent Labs  04/11/14 1910  TROPONINI <0.30   Lab Results  Component Value Date   WBC 10.7* 04/11/2014   HGB 14.5 04/11/2014   HCT 45.1 04/11/2014   MCV 87.4 04/11/2014   PLT 135* 04/11/2014    Recent Labs Lab 04/11/14 1910  NA 144  K 5.1  CL 105  CO2 25  BUN 12  CREATININE 1.14*  CALCIUM 8.9  GLUCOSE 101*   Lab Results  Component Value Date   CHOL 129 02/06/2013   HDL 30* 02/06/2013   LDLCALC 85 02/06/2013   TRIG 70 02/06/2013   Lab Results  Component Value Date   DDIMER 0.30 02/15/2013    Radiology/Studies:  Dg Chest 2 View  03/22/2014   CLINICAL DATA:  Chest pain and shortness of breath  EXAM: CHEST  2 VIEW  COMPARISON:  02/15/2013  FINDINGS: Cardiac shadow is within normal limits. The lungs are well aerated bilaterally. Patchy  infiltrates are noted in the midportion of both lungs but much more prominent on the right than the left. Followup imaging is recommended. No sizable effusion is seen. No acute bony abnormality is noted.  IMPRESSION: Bilateral infiltrates right greater than left. Followup imaging is recommended.   Electronically Signed   By: Inez Catalina M.D.   On: 03/22/2014 07:23   Ct Chest Wo Contrast  03/22/2014   CLINICAL DATA:  Chest pain.  Shortness of breath.  EXAM: CT CHEST WITHOUT C  IMPRESSION: 1. Severe bilateral pulmonary infiltrates. Differential diagnosis includes an infectious etiology including bilateral pneumonia including atypical or granulomatous infection, acute pulmonary edema  of cardiogenic or noncardiogenic origin, inflammatory pneumonitis, inhalational injury, drug reaction, and hemorrhage.  2.  Coronary artery disease.  Mild cardiomegaly.   Electronically Signed   By: Marcello Moores  Register   On: 03/22/2014 08:38    EKG: Atrial flutter , diffuse ST depression and isolated J point elevation in aVL  Physical Exam: Blood pressure 132/75, pulse 47, temperature 98.7 F (37.1 C), temperature source Oral, resp. rate 29, height 5\' 3"  (1.6 m), weight 92.534 kg (204 lb), SpO2 97.00%. General: Well developed, well nourished, in no acute distress.  Neck: Negative for carotid bruits. JVD not elevated. Lungs: Clear bilaterally to auscultation without wheezes, rales, or rhonchi. Breathing is unlabored. Heart: tachycardic  with S1 S2. No murmurs, rubs, or gallops appreciated. Abdomen: Soft, non-tender, non-distended with normoactive bowel sounds. No hepatomegaly. No rebound/guarding. No obvious abdominal masses. Extremities: No clubbing or cyanosis. No edema.  Distal pedal pulses are 2+ and equal bilaterally. Neuro: Alert and oriented X 3. No facial asymmetry. No focal deficit. Moves all extremities spontaneously. Psych:  Responds to questions appropriately with a normal affect.    Cory Roughen, A  M.D  04/11/2014, 11:05 PM

## 2014-04-11 NOTE — Progress Notes (Signed)
Patient presents for work PE Brought paper work to be filled out States feels fine today  Report given to 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

## 2014-04-11 NOTE — Patient Instructions (Signed)
Pt transferred to [facility] hospital via EMS in stable condition. Report given to charge nurse

## 2014-04-11 NOTE — ED Provider Notes (Signed)
CSN: [id number]     Arrival date & time 04/11/14  1830 History   First MD Initiated Contact with Patient 04/11/14 1859     Chief Complaint  Patient presents with  . Atrial Flutter     (Consider location/radiation/quality/duration/timing/severity/associated sxs/prior Treatment) HPI Patient reports she has a history of atrial flutter/atrial fibrillation. She states in the past she has had some shortness of breath when she was out of rhythm. She states she was diagnosed about 2 years ago. She states her last episode was about 2 months ago. She states today she went to the [facility] to get a work physical done and she was noted to be in atrial flutter. She denies any palpitations or shortness of breath today. She denies any chest pain, lightheadedness, dizziness, swelling or pain in her legs, or fatigue. She states the last time she saw Dr. wall he increased her metoprolol to twice a day, increase the dose of her hydralazine to 50 mg 3 times a day stopped her flecainide, stopped her Coumadin and increase her aspirin from 81 mg to 325 mg a day. She states they had discussed possible ablation if she continued to have arrhythmia. She has been cardioverted twice last year.   PCP [place]  Past Medical History  Diagnosis Date  . Paroxysmal a-fib   . Shortness of breath   . Hypertension   . Transient ischemic attack (TIA) 01/2013  . Encounter for long-term (current) use of other medications   . Heart murmur   . Pneumonia 02/2011; 08/2011; 03/22/2014   Past Surgical History  Procedure Laterality Date  . Tee without cardioversion N/A 02/18/2013    Procedure: TRANSESOPHAGEAL ECHOCARDIOGRAM (TEE);  Surgeon: Sanda Klein, MD;  Location: [place];  Service: Cardiovascular;  Laterality: N/A;  . Cardioversion N/A 02/18/2013    Procedure: CARDIOVERSION;  Surgeon: Sanda Klein, MD;  Location: [facility] ENDOSCOPY;  Service: Cardiovascular;  Laterality: N/A;  . Cardioversion N/A 02/23/2013   Procedure: CARDIOVERSION;  Surgeon: Leonie Man, MD;  Location: [place];  Service: Cardiovascular;  Laterality: N/A;  BESIDE CV  . Tubal ligation  1992   Family History  Problem Relation Age of Onset  . Hypertension    . Diabetes    . Cancer Mother   . Hypertension Father   . Atrial fibrillation Father   . Peripheral vascular disease Father    History  Substance Use Topics  . Smoking status: Former Smoker -- 3 years    Types: Cigars    Quit date: 03/15/2013  . Smokeless tobacco: Never Used  . Alcohol Use: 3.6 oz/week    6 Cans of beer per week   Last drank 2 beers 3 nights a ago, only drinks on weekends, no longer during the week.   OB History   Grav Para Term Preterm Abortions TAB SAB Ect Mult Living                 Review of Systems  All other systems reviewed and are negative.     Allergies  Penicillins  Home Medications   Prior to Admission medications   Medication Sig Start Date End Date Taking? Authorizing Provider  aspirin EC 325 MG tablet Take 1 tablet (325 mg total) by mouth daily. 02/22/14  Yes Renella Cunas, MD  hydrALAZINE (APRESOLINE) 25 MG tablet Take 2 tablets (50 mg total) by mouth 3 (three) times daily. 02/22/14  Yes Renella Cunas, MD  metoprolol tartrate (LOPRESSOR) 25 MG tablet Take 1 tablet (  25 mg total) by mouth 2 (two) times daily. 02/22/14  Yes Renella Cunas, MD    ED Triage Vitals  Enc Vitals Group     BP 04/11/14 1843 135/94 mmHg     Pulse Rate 04/11/14 1900 136     Resp 04/11/14 1843 14     Temp 04/11/14 1843 98.7 F (37.1 C)     Temp src 04/11/14 1843 Oral     SpO2 04/11/14 1843 100 %     Weight 04/11/14 1843 204 lb (92.534 kg)     Height 04/11/14 1843 5\' 3"  (1.6 m)     Head Cir --      Peak Flow --      Pain Score 04/11/14 2017 1     Pain Loc --      Pain Edu? --      Excl. in [place]? --    Vital signs normal except tachycardia    Physical Exam  Nursing note and vitals reviewed. Constitutional: She is oriented to person,  place, and time. She appears well-developed and well-nourished.  Non-toxic appearance. She does not appear ill. No distress.  HENT:  Head: Normocephalic and atraumatic.  Right Ear: External ear normal.  Left Ear: External ear normal.  Nose: Nose normal. No mucosal edema or rhinorrhea.  Mouth/Throat: Oropharynx is clear and moist and mucous membranes are normal. No dental abscesses or uvula swelling.  Eyes: Conjunctivae and EOM are normal. Pupils are equal, round, and reactive to light.  Neck: Normal range of motion and full passive range of motion without pain. Neck supple.  Cardiovascular: Regular rhythm and normal heart sounds.  Tachycardia present.  Exam reveals no gallop and no friction rub.   No murmur heard. Pulmonary/Chest: Effort normal and breath sounds normal. No respiratory distress. She has no wheezes. She has no rhonchi. She has no rales. She exhibits no tenderness and no crepitus.  Abdominal: Soft. Normal appearance and bowel sounds are normal. She exhibits no distension. There is no tenderness. There is no rebound and no guarding.  Musculoskeletal: Normal range of motion. She exhibits no edema and no tenderness.  Moves all extremities well.   Neurological: She is alert and oriented to person, place, and time. She has normal strength. No cranial nerve deficit.  Skin: Skin is warm, dry and intact. No rash noted. No erythema. No pallor.  Psychiatric: She has a normal mood and affect. Her speech is normal and behavior is normal. Her mood appears not anxious.    ED Course  Procedures (including critical care time)  Medications  diltiazem (CARDIZEM) 1 mg/mL load via infusion 15 mg (0 mg Intravenous Stopped 04/11/14 2211)    And  diltiazem (CARDIZEM) 100 mg in dextrose 5 % 100 mL infusion (15 mg/hr Intravenous Rate/Dose Change 04/11/14 2239)  heparin ADULT infusion 100 units/mL (25000 units/250 mL) (1,050 Units/hr Intravenous New Bag/Given 04/11/14 2202)  metoprolol (LOPRESSOR)  injection 5 mg (5 mg Intravenous Given 04/11/14 1929)  heparin bolus via infusion 4,000 Units (0 Units Intravenous Stopped 04/11/14 2211)   Patient had no response to metoprolol. She was started on a Cardizem drip. Since it is not clear as to when this arrhythmia started she was also started on heparin. She was in NSR on July 8th by [facility]  22:13 Dr Inda Castle, cardiology, will see patient  23:35 pt continues on cardizem drip, HR 117 with flutter waves  Labs Review Results for orders placed during the hospital encounter of 04/11/14  CBC WITH DIFFERENTIAL  Result Value Ref Range   WBC 10.7 (*) 4.0 - 10.5 K/uL   RBC 5.16 (*) 3.87 - 5.11 MIL/uL   Hemoglobin 14.5  12.0 - 15.0 g/dL   HCT 45.1  36.0 - 46.0 %   MCV 87.4  78.0 - 100.0 fL   MCH 28.1  26.0 - 34.0 pg   MCHC 32.2  30.0 - 36.0 g/dL   RDW 14.3  11.5 - 15.5 %   Platelets 135 (*) 150 - 400 K/uL   Neutrophils Relative % 73  43 - 77 %   Neutro Abs 7.8 (*) 1.7 - 7.7 K/uL   Lymphocytes Relative 21  12 - 46 %   Lymphs Abs 2.2  0.7 - 4.0 K/uL   Monocytes Relative 5  3 - 12 %   Monocytes Absolute 0.5  0.1 - 1.0 K/uL   Eosinophils Relative 1  0 - 5 %   Eosinophils Absolute 0.1  0.0 - 0.7 K/uL   Basophils Relative 0  0 - 1 %   Basophils Absolute 0.0  0.0 - 0.1 K/uL  BASIC METABOLIC PANEL      Result Value Ref Range   Sodium 144  137 - 147 mEq/L   Potassium 5.1  3.7 - 5.3 mEq/L   Chloride 105  96 - 112 mEq/L   CO2 25  19 - 32 mEq/L   Glucose, Bld 101 (*) 70 - 99 mg/dL   BUN 12  6 - 23 mg/dL   Creatinine, Ser 1.14 (*) 0.50 - 1.10 mg/dL   Calcium 8.9  8.4 - 10.5 mg/dL   GFR calc non Af Amer 56 (*) >90 mL/min   GFR calc Af Amer 64 (*) >90 mL/min   Anion gap 14  5 - 15  TROPONIN I      Result Value Ref Range   Troponin I <0.30  <0.30 ng/mL  PROTIME-INR      Result Value Ref Range   Prothrombin Time 13.9  11.6 - 15.2 seconds   INR 1.07  0.00 - 1.49      Dg Chest 2 View  03/22/2014   CLINICAL DATA:  Chest pain and shortness of  breath  EXAM: CHEST  2 VIEW  COMPARISON:  02/15/2013  FINDINGS: Cardiac shadow is within normal limits. The lungs are well aerated bilaterally. Patchy infiltrates are noted in the midportion of both lungs but much more prominent on the right than the left. Followup imaging is recommended. No sizable effusion is seen. No acute bony abnormality is noted.  IMPRESSION: Bilateral infiltrates right greater than left. Followup imaging is recommended.   Electronically Signed   By: Inez Catalina M.D.   On: 03/22/2014 07:23   Ct Chest Wo Contrast  03/22/2014   CLINICAL DATA:  Chest pain.  Shortness of breath.  .  IMPRESSION: 1. Severe bilateral pulmonary infiltrates. Differential diagnosis includes an infectious etiology including bilateral pneumonia including atypical or granulomatous infection, acute pulmonary edema of cardiogenic or noncardiogenic origin, inflammatory pneumonitis, inhalational injury, drug reaction, and hemorrhage.  2.  Coronary artery disease.  Mild cardiomegaly.   Electronically Signed   By: Marcello Moores  Register   On: 03/22/2014 08:38      Imaging Review No results found.   EKG Interpretation None       Date: 04/11/2014  Rate: 135  Rhythm: atrial flutter  QRS Axis: normal  Intervals: normal  ST/T Wave abnormalities: normal  Conduction Disutrbances:none  Narrative Interpretation:   Old EKG Reviewed: changes noted from  22 Mar 2014 was in NRS    MDM   Final diagnoses:  Atrial flutter, unspecified    disposition pending, probably admission  Rolland Porter, MD, Alanson Aly, MD 04/11/14 2337

## 2014-04-11 NOTE — Progress Notes (Signed)
Patient ID: [name], female   DOB: July 03, 1966, 48 y.o.   MRN: [id number]  CC: work physical  HPI:  Patient presents today for a work physical.  Patient vitals obtain and was found to have a heart rate of 136.  Patient has a history of Atrial flutter and is being followed by Dr. Verl Murphy.  Patient has been noncompliant so her flecainide and coumadin were discontinued. Patient reports that she now only takes aspirin 325 mg daily and metoprolol.  EKG obtained and patient was found to be in atrial flutter with RVR.  Patient denies SOB and chest pain.  Patient admits to palpitations.    Allergies  Allergen Reactions  . Penicillins Other (See Comments)    unknown   Past Medical History  Diagnosis Date  . Paroxysmal a-fib   . Shortness of breath   . Hypertension   . Transient ischemic attack (TIA) 01/2013  . Encounter for long-term (current) use of other medications   . Heart murmur   . Pneumonia 02/2011; 08/2011; 03/22/2014   Current Outpatient Prescriptions on File Prior to Visit  Medication Sig Dispense Refill  . aspirin EC 325 MG tablet Take 1 tablet (325 mg total) by mouth daily.  30 tablet  2  . hydrALAZINE (APRESOLINE) 25 MG tablet Take 2 tablets (50 mg total) by mouth 3 (three) times daily.  60 tablet  2  . metoprolol tartrate (LOPRESSOR) 25 MG tablet Take 1 tablet (25 mg total) by mouth 2 (two) times daily.  60 tablet  2  . azithromycin (ZITHROMAX) 500 MG tablet Take 1 tablet (500 mg total) by mouth daily.  5 tablet  0  . cefUROXime (CEFTIN) 250 MG tablet Take 1 tablet (250 mg total) by mouth 2 (two) times daily with a meal.  5 tablet  0  . metroNIDAZOLE (FLAGYL) 500 MG tablet Take 1 tablet (500 mg total) by mouth 2 (two) times daily.  14 tablet  0   No current facility-administered medications on file prior to visit.   Family History  Problem Relation Age of Onset  . Hypertension    . Diabetes    . Cancer Mother   . Hypertension Father   . Atrial fibrillation Father   .  Peripheral vascular disease Father    History   Social History  . Marital Status: Single    Spouse Name: N/A    Number of Children: N/A  . Years of Education: N/A   Occupational History  . assembly line    Social History Main Topics  . Smoking status: Former Smoker -- 3 years    Types: Cigars    Quit date: 03/15/2013  . Smokeless tobacco: Never Used  . Alcohol Use: 3.6 oz/week    6 Cans of beer per week  . Drug Use: No  . Sexual Activity: Not Currently   Other Topics Concern  . Not on file   Social History Narrative  . No narrative on file    Review of Systems: See HPI  Objective:   Filed Vitals:   04/11/14 1712  BP: 132/90  Pulse: 133  Temp: 98.2 F (36.8 C)  Resp: 20   Physical Exam  Vitals reviewed. Constitutional: She is oriented to person, place, and time. No distress.  Cardiovascular:  Regularly irregular rhythm  Pulmonary/Chest: Effort normal.  Neurological: She is alert and oriented to person, place, and time.  Skin: She is not diaphoretic.  Psychiatric: She has a normal mood and affect. Thought content  normal.     Lab Results  Component Value Date   WBC 10.9* 03/28/2014   HGB 13.2 03/28/2014   HCT 40.6 03/28/2014   MCV 84.2 03/28/2014   PLT 227 03/28/2014   Lab Results  Component Value Date   CREATININE 0.82 03/24/2014   BUN 10 03/24/2014   NA 140 03/24/2014   K 4.0 03/24/2014   CL 102 03/24/2014   CO2 21 03/24/2014    Lab Results  Component Value Date   HGBA1C 5.7* 02/06/2013   Lipid Panel     Component Value Date/Time   CHOL 129 02/06/2013 0530   TRIG 70 02/06/2013 0530   HDL 30* 02/06/2013 0530   CHOLHDL 4.3 02/06/2013 0530   VLDL 14 02/06/2013 0530   LDLCALC 85 02/06/2013 0530       Assessment and plan:   [name] was seen today for annual exam.  Diagnoses and associated orders for this visit:  Atrial flutter, unspecified Patient will be transferred to ER for further management.  Stressed to patient the importance of medication  compliance.    Tachycardia - EKG 12-Lead; Standing - EKG 12-Lead  Patient will need to follow up with Dr. Verl Murphy within one week of discharge.         Elizabeth Manning, NP-C [facility] [phone] 04/11/2014, 5:55 PM

## 2014-04-11 NOTE — Progress Notes (Signed)
ANTICOAGULATION CONSULT NOTE - Initial Consult  Pharmacy Consult for heparin Indication: atrial fibrillation  Allergies  Allergen Reactions  . Penicillins Other (See Comments)    unknown    Patient Measurements: Height: 5\' 3"  (160 cm) Weight: 204 lb (92.534 kg) IBW/kg (Calculated) : 52.4 Heparin Dosing Weight: 76 kg  Vital Signs: Temp: 98.7 F (37.1 C) (07/28 1843) Temp src: Oral (07/28 1843) BP: 144/99 mmHg (07/28 2045) Pulse Rate: 136 (07/28 2045)  Labs:  Recent Labs  04/11/14 1910  HGB 14.5  HCT 45.1  PLT 135*  CREATININE 1.14*  TROPONINI <0.30    Estimated Creatinine Clearance: 64.5 ml/min (by C-G formula based on Cr of 1.14).   Medical History: Past Medical History  Diagnosis Date  . Paroxysmal a-fib   . Shortness of breath   . Hypertension   . Transient ischemic attack (TIA) 01/2013  . Encounter for long-term (current) use of other medications   . Heart murmur   . Pneumonia 02/2011; 08/2011; 03/22/2014    Medications:  Aspirin, hydralazine, metoprolol  Assessment: 48 yo female with hx afib, non-compliant with flecainide and Coumadin in the past.  Hx TIA and HTN.  Currently only taking metoprolol and ASA PTA.  Admitted with aflutter and RVR.  Pharmacy asked to begin anticoagulation with IV heparin.  Baseline CBC WNL.  Goal of Therapy:  Heparin level 0.3-0.7 units/ml Monitor platelets by anticoagulation protocol: Yes   Plan:  1. Start IV heparin with bolus of 4000 units x 1. 2. Then start heparin gtt at 1050 units/hr. 3. Check heparin level 6 hrs after gtt starts. 4. Daily heparin level and CBC.  Uvaldo Rising, BCPS  Clinical Pharmacist Pager [phone]  04/11/2014 9:17 PM

## 2014-04-12 ENCOUNTER — Encounter: Payer: Self-pay | Admitting: *Deleted

## 2014-04-12 ENCOUNTER — Observation Stay (HOSPITAL_COMMUNITY): Payer: Self-pay

## 2014-04-12 ENCOUNTER — Other Ambulatory Visit: Payer: Self-pay | Admitting: Cardiology

## 2014-04-12 ENCOUNTER — Other Ambulatory Visit: Payer: Self-pay

## 2014-04-12 DIAGNOSIS — I482 Chronic atrial fibrillation, unspecified: Secondary | ICD-10-CM | POA: Diagnosis present

## 2014-04-12 DIAGNOSIS — Z0000 Encounter for general adult medical examination without abnormal findings: Secondary | ICD-10-CM

## 2014-04-12 DIAGNOSIS — Z8673 Personal history of transient ischemic attack (TIA), and cerebral infarction without residual deficits: Secondary | ICD-10-CM

## 2014-04-12 DIAGNOSIS — I10 Essential (primary) hypertension: Secondary | ICD-10-CM

## 2014-04-12 DIAGNOSIS — I483 Typical atrial flutter: Secondary | ICD-10-CM

## 2014-04-12 DIAGNOSIS — I4892 Unspecified atrial flutter: Principal | ICD-10-CM

## 2014-04-12 DIAGNOSIS — R000 Tachycardia, unspecified: Secondary | ICD-10-CM

## 2014-04-12 LAB — COMPREHENSIVE METABOLIC PANEL
ALBUMIN: 3.3 g/dL — AB (ref 3.5–5.2)
ALT: 19 U/L (ref 0–35)
ANION GAP: 18 — AB (ref 5–15)
AST: 25 U/L (ref 0–37)
Alkaline Phosphatase: 81 U/L (ref 39–117)
BUN: 9 mg/dL (ref 6–23)
CALCIUM: 8.7 mg/dL (ref 8.4–10.5)
CO2: 21 mEq/L (ref 19–32)
CREATININE: 0.83 mg/dL (ref 0.50–1.10)
Chloride: 102 mEq/L (ref 96–112)
GFR calc Af Amer: >90 mL/min (ref >90)
GFR calc non Af Amer: 81 mL/min — ABNORMAL LOW (ref >90)
Glucose, Bld: 162 mg/dL — ABNORMAL HIGH (ref 70–99)
Potassium: 3.6 mEq/L — ABNORMAL LOW (ref 3.7–5.3)
Sodium: 141 mEq/L (ref 137–147)
TOTAL PROTEIN: 7.4 g/dL (ref 6.0–8.3)
Total Bilirubin: 0.5 mg/dL (ref 0.3–1.2)

## 2014-04-12 LAB — TB SKIN TEST
Induration: 0 mm
TB Skin Test: NEGATIVE

## 2014-04-12 LAB — PRO B NATRIURETIC PEPTIDE: Pro B Natriuretic peptide (BNP): 2538 pg/mL — ABNORMAL HIGH (ref 0–125)

## 2014-04-12 LAB — CBC
HCT: 45.1 % (ref 36.0–46.0)
Hemoglobin: 14.5 g/dL (ref 12.0–15.0)
MCH: 27.9 pg (ref 26.0–34.0)
MCHC: 32.2 g/dL (ref 30.0–36.0)
MCV: 86.7 fL (ref 78.0–100.0)
Platelets: 206 10*3/uL (ref 150–400)
RBC: 5.2 MIL/uL — ABNORMAL HIGH (ref 3.87–5.11)
RDW: 14.3 % (ref 11.5–15.5)
WBC: 10 10*3/uL (ref 4.0–10.5)

## 2014-04-12 LAB — HEPARIN LEVEL (UNFRACTIONATED)
HEPARIN UNFRACTIONATED: 0.43 [IU]/mL (ref 0.30–0.70)
HEPARIN UNFRACTIONATED: 0.52 [IU]/mL (ref 0.30–0.70)

## 2014-04-12 MED ORDER — METOPROLOL TARTRATE 25 MG PO TABS
25.0000 mg | ORAL_TABLET | Freq: Two times a day (BID) | ORAL | Status: DC
  Administered 2014-04-12: 25 mg via ORAL
  Filled 2014-04-12 (×3): qty 1

## 2014-04-12 MED ORDER — ASPIRIN EC 325 MG PO TBEC
325.0000 mg | DELAYED_RELEASE_TABLET | Freq: Every day | ORAL | Status: DC
  Administered 2014-04-12: 325 mg via ORAL
  Filled 2014-04-12: qty 1

## 2014-04-12 MED ORDER — HYDROCHLOROTHIAZIDE 12.5 MG PO CAPS: 12.5000 mg | ORAL_CAPSULE | Freq: Every day | ORAL | Status: DC

## 2014-04-12 MED ORDER — RIVAROXABAN 20 MG PO TABS: 20.0000 mg | ORAL_TABLET | Freq: Every day | ORAL | Status: DC

## 2014-04-12 MED ORDER — HYDRALAZINE HCL 50 MG PO TABS
50.0000 mg | ORAL_TABLET | Freq: Three times a day (TID) | ORAL | Status: DC
  Administered 2014-04-12 (×2): 50 mg via ORAL
  Filled 2014-04-12 (×4): qty 1

## 2014-04-12 MED ORDER — HYDROCHLOROTHIAZIDE 12.5 MG PO CAPS
12.5000 mg | ORAL_CAPSULE | Freq: Every day | ORAL | Status: DC
  Administered 2014-04-12: 12.5 mg via ORAL
  Filled 2014-04-12: qty 1

## 2014-04-12 MED ORDER — RIVAROXABAN 20 MG PO TABS
20.0000 mg | ORAL_TABLET | Freq: Every day | ORAL | Status: DC
  Administered 2014-04-12: 20 mg via ORAL
  Filled 2014-04-12: qty 1

## 2014-04-12 NOTE — Discharge Summary (Addendum)
Physician Discharge Summary       Patient ID: [name] MRN: [id number] DOB/AGE: [birth year] 48 y.o.  Admit date: 04/11/2014 Discharge date: 04/12/2014  Discharge Diagnoses:  Principal Problem:   Atrial flutter with rapid ventricular response, in and out at discharge- chronic Active Problems:   Hx-TIA (transient ischemic attack) 02/05/13   HTN (hypertension)   Atrial flutter   Anticoagulation adequate, Xarelto started   Discharged Condition: good  Procedures:None  Hospital Course: 48 year old overweight African American female with a history of paroxysmal atrial fibrillation/flutter s/p cardioversion in 2014 (on daily ASA and no longer on Flecainide and coumadin secondary to finances and convience),nl EF with severe LVH and mod Pulm HTN by TTE but by TEE normal wall thickness, TIA, and hypertension presented to ER with Aflutter with RVR .  She was at a physical exam when she was noticed to have tachycardia and was sent to the ER where she was found to be in [place] with RVR. Pt denies any symptoms . No orthopnea, PND , LE edema , DOE , chest pain, focal weakness, syncope, bleeding diathesis , claudication , palpitation etc .  Reports medication compliance . However, unable to do INR checks for outpt coumadin . States her Flecainide was stopped by her cardiologist since ' was not working' . Pt also recently recovered from pulmonary infection for which she had CT chest done in the ER and was given antibiotic course.  She was placed on Heparin and IV dilt.  During the night and AM she was in and out of PAFl.  When in SR She was in Junctional at one point and in SB at other times.  Pt was seen by Dr. Verl Blalock in June 2015 and she was non compliant.  She has agreed to take her meds including Xarelto.  I doubt she would consider coumadin if we cannot provide Xarelto.  She will follow up with her PCP until her appt with Dr. Rayann Heman for possible ablation.  She chronically goes in and out of rapid a  flutter and has for some time.     Her pro bnp was elevated but on exam and CXR no CHF, most likely due to elevated HR.     Consults: None  Significant Diagnostic Studies:  BMET    Component Value Date/Time   NA 141 04/12/2014 0305   K 3.6* 04/12/2014 0305   CL 102 04/12/2014 0305   CO2 21 04/12/2014 0305   GLUCOSE 162* 04/12/2014 0305   BUN 9 04/12/2014 0305   CREATININE 0.83 04/12/2014 0305   CALCIUM 8.7 04/12/2014 0305   GFRNONAA 81* 04/12/2014 0305   GFRAA >90 04/12/2014 0305    CBC    Component Value Date/Time   WBC 10.0 04/12/2014 0355   RBC 5.20* 04/12/2014 0355   HGB 14.5 04/12/2014 0355   HCT 45.1 04/12/2014 0355   PLT 206 04/12/2014 0355   MCV 86.7 04/12/2014 0355   MCH 27.9 04/12/2014 0355   MCHC 32.2 04/12/2014 0355   RDW 14.3 04/12/2014 0355   LYMPHSABS 2.2 04/11/2014 1910   MONOABS 0.5 04/11/2014 1910   EOSABS 0.1 04/11/2014 1910   BASOSABS 0.0 04/11/2014 1910   Troponin <0.30 BNP (last 3 results)  Recent Labs  03/22/14 0805 04/12/14 0355  PROBNP 219.5* 2538.0*    CHEST 2 VIEW  COMPARISON: 03/22/2014  FINDINGS:  Normal heart size and pulmonary vascularity. Since the previous  study, there has been interval improvement of previous bilateral  perihilar infiltrates. Slight peribronchial  thickening remains  consistent with bronchitis. No focal airspace disease or  consolidation today. No blunting of costophrenic angles. No  pneumothorax.  IMPRESSION:  Interval improvement of perihilar infiltration since previous study.    Discharge Exam: Blood pressure 121/77, pulse 100, temperature 98 F (36.7 C), temperature source Oral, resp. rate 20, height 5\' 3"  (1.6 m), weight 203 lb 11.3 oz (92.4 kg), SpO2 98.00%.   Disposition: 01-Home or Self Care     Medication List    STOP taking these medications       aspirin EC 325 MG tablet      TAKE these medications       hydrALAZINE 25 MG tablet  Commonly known as:  APRESOLINE  Take 2 tablets (50 mg total) by  mouth 3 (three) times daily.     hydrochlorothiazide 12.5 MG capsule  Commonly known as:  MICROZIDE  Take 1 capsule (12.5 mg total) by mouth daily.     metoprolol tartrate 25 MG tablet  Commonly known as:  LOPRESSOR  Take 1 tablet (25 mg total) by mouth 2 (two) times daily.     rivaroxaban 20 MG Tabs tablet  Commonly known as:  XARELTO  Take 1 tablet (20 mg total) by mouth daily with supper.       Follow-up Information   Follow up with Thompson Grayer, MD On 05/24/2014. (at 11:30am)    Specialty:  Cardiology   Contact information:   [place] [address] [phone]       Follow up with [facility] On 04/13/2014. (10:30am )    Contact information:   [place] [address] [phone]      Follow up with Thompson Grayer, MD On 04/20/2014. (at 3:00 PM  for echo of your heart)    Specialty:  Cardiology   Contact information:   [place] [address] [phone]        Discharge Instructions: Please call if you have any bleeding.  Any problems with your tachycardia.  We are arranging for you to follow up with Dr. Rayann Heman for possible ablation.  Heart Healthy diet.  Signed: Isaiah Serge Nurse Practitioner-Certified [facility]: HEARTCARE 04/12/2014, 3:26 PM  Time spent on discharge : 30 minutes.    I have examined the patient and reviewed assessment and plan and discussed with patient.  Agree with above as stated.  Recurrent intermittent flutter.  Evaluate for ablation.  She has failed flecainide and metoprolol.  Send home on Xarelto.  F/u with EP after a month of anticoagulation.  Tiearra Colwell S.

## 2014-04-12 NOTE — Progress Notes (Signed)
PA Mickel Baas made aware of pt converting back to A-flutter. No new orders received.

## 2014-04-12 NOTE — Progress Notes (Signed)
[place] for heparin Indication: atrial fibrillation  Allergies  Allergen Reactions  . Penicillins Other (See Comments)    unknown    Patient Measurements: Height: 5\' 3"  (160 cm) Weight: 203 lb 11.3 oz (92.4 kg) IBW/kg (Calculated) : 52.4 Heparin Dosing Weight: 76 kg  Vital Signs: Temp: 98 F (36.7 C) (07/29 0446) Temp src: Oral (07/29 0446) BP: 104/55 mmHg (07/29 0710) Pulse Rate: 68 (07/29 0446)  Labs:  Recent Labs  04/11/14 1910 04/11/14 2143 04/12/14 0305 04/12/14 0355 04/12/14 1125  HGB 14.5  --   --  14.5  --   HCT 45.1  --   --  45.1  --   PLT 135*  --   --  206  --   LABPROT  --  13.9  --   --   --   INR  --  1.07  --   --   --   HEPARINUNFRC  --   --   --  0.43 0.52  CREATININE 1.14*  --  0.83  --   --   TROPONINI <0.30  --   --   --   --     Estimated Creatinine Clearance: 88.5 ml/min (by C-G formula based on Cr of 0.83).  Assessment: 48 yo female with Aflutter for heparin, noted she was noncompliant with warfarin and flecainide in the past. Admitted in AFlutter with RVR- has since converted. therapeutic x2 on heparin at 1050 units/hr. CBC is WNL, stable. No bleeding noted.  Goal of Therapy:  Heparin level 0.3-0.7 units/ml Monitor platelets by anticoagulation protocol: Yes   Plan:  1. Continue heparin at 1050 units/hr 2. Daily HL and CBC 3. Follow for cardiology plan regarding long term anti-coagulation  Soyla Bainter D. Aldora Perman, PharmD, BCPS Clinical Pharmacist Pager: [phone] 04/12/2014 12:14pm    ADDENDUM Patient to transition to Xarelto for long term anticoagulation. SCr 0.8mg /dL with est CrCL ~85-67mL/min. CBC is stable and WNL with no bleeding noted.  Plan: 1. Continue heparin 1050 units/hr until 1700 tonight 2. At 1700 tonight, start Xarelto 20mg  qSupper 3. CBC q72h minimum while in hospital 4. Pharmacy will provide education  Knute Mazzuca D. Baelyn Doring, PharmD, BCPS Clinical Pharmacist Pager:  [phone] 04/12/2014 1:47 PM

## 2014-04-12 NOTE — Progress Notes (Signed)
48 yo female with Aflutter placed on heparin, noted she was noncompliant with warfarin and flecainide in the past. Admitted in AFlutter with RVR- has since converted. therapeutic x2 on heparin at 1050 units/hr. CBC is WNL, stable. No bleeding noted.    Subjective: No complaints   Objective: Vital signs in last 24 hours: Temp:  [98 F (36.7 C)-98.7 F (37.1 C)] 98 F (36.7 C) (07/29 0446) Pulse Rate:  [44-139] 68 (07/29 0446) Resp:  [14-29] 20 (07/29 0446) BP: (104-151)/(55-106) 104/55 mmHg (07/29 0710) SpO2:  [93 %-100 %] 98 % (07/29 0446) Weight:  [203 lb 11.3 oz (92.4 kg)-204 lb 9.6 oz (92.806 kg)] 203 lb 11.3 oz (92.4 kg) (07/29 0113) Weight change:  Last BM Date: 04/11/14 Intake/Output from previous day: -250 07/28 0701 - 07/29 0700 In: -  Out: 250 [Urine:250] Intake/Output this shift:    PE: PER MD: General:Pleasant affect, NAD Skin:Warm and dry, brisk capillary refill HEENT:normocephalic, sclera clear, mucus membranes moist Heart:S1S2 RRR without murmur, gallup, rub or click Lungs:clear without rales, rhonchi, or wheezes JSE:GBTD, non tender, + BS, do not palpate liver spleen or masses Ext:no lower ext edema, 2+ pedal pulses, 2+ radial pulses Neuro:alert and oriented, MAE, follows commands, + facial symmetry   Lab Results:  Recent Labs  04/11/14 1910 04/12/14 0355  WBC 10.7* 10.0  HGB 14.5 14.5  HCT 45.1 45.1  PLT 135* 206   BMET  Recent Labs  04/11/14 1910 04/12/14 0305  NA 144 141  K 5.1 3.6*  CL 105 102  CO2 25 21  GLUCOSE 101* 162*  BUN 12 9  CREATININE 1.14* 0.83  CALCIUM 8.9 8.7    Recent Labs  04/11/14 1910  TROPONINI <0.30    Lab Results  Component Value Date   CHOL 129 02/06/2013   HDL 30* 02/06/2013   LDLCALC 85 02/06/2013   TRIG 70 02/06/2013   CHOLHDL 4.3 02/06/2013   Hepatic Function Panel  Recent Labs  04/12/14 0305  PROT 7.4  ALBUMIN 3.3*  AST 25  ALT 19  ALKPHOS 81  BILITOT 0.5   No results found for  this basename: CHOL,  in the last 72 hours No results found for this basename: PROTIME,  in the last 72 hours   BNP (last 3 results)  Recent Labs  03/22/14 0805 04/12/14 0355  PROBNP 219.5* 2538.0*    Studies/Results: Dg Chest 2 View  04/12/2014   CLINICAL DATA:  Atrial fibrillation tonight.  EXAM: CHEST  2 VIEW  COMPARISON:  03/22/2014  FINDINGS: Normal heart size and pulmonary vascularity. Since the previous study, there has been interval improvement of previous bilateral perihilar infiltrates. Slight peribronchial thickening remains consistent with bronchitis. No focal airspace disease or consolidation today. No blunting of costophrenic angles. No pneumothorax.  IMPRESSION: Interval improvement of perihilar infiltration since previous study.   Electronically Signed   By: Lucienne Capers M.D.   On: 04/12/2014 01:10    Medications: I have reviewed the patient's current medications. Scheduled Meds: . aspirin EC  325 mg Oral Daily  . hydrALAZINE  50 mg Oral 3 times per day  . hydrochlorothiazide  12.5 mg Oral Daily  . metoprolol tartrate  25 mg Oral BID   Continuous Infusions: . heparin 1,050 Units/hr (04/11/14 2202)   PRN Meds:.  Assessment/Plan:  Principal Problem:   Atrial flutter with rapid ventricular response- is not aware of when she is in a flutter- she is in and out  HR in the  40-50s in SR Active Problems:   Hx-TIA (transient ischemic attack) 02/05/13   HTN (hypertension)- on TTE in 2014 she had severe LVH but on TEE she had normal wall thickness, EF 45-50%   Will ask care manager to eval for xarelto or eliquis as outpt, she has no insurance.     LOS: 1 day   Time spent with pt. :15 minutes. [facility]  Nurse Practitioner Certified Pager [phone] or after 5pm and on weekends call [phone] 04/12/2014, 12:22 PM   I have examined the patient and reviewed assessment and plan and discussed with patient.  Agree with above as stated.  High risk of stroke given  prior TIA and HTN, severe LVH.  Stop ASA and switch to NOAC as above.  Akia Desroches S.

## 2014-04-12 NOTE — Discharge Instructions (Signed)
Please call if you have any bleeding.  Any problems with your tachycardia.  We are arranging for you to follow up with Dr. Rayann Heman for possible ablation.  Heart Healthy diet.  Stop asprin and use Xarelto instead.    Information on my medicine - XARELTO (Rivaroxaban)  This medication education was reviewed with me or my healthcare representative as part of my discharge preparation.  The pharmacist that spoke with me during my hospital stay was:  Bajbus, Lauren, RPH  Why was Xarelto prescribed for you? Xarelto was prescribed for you to reduce the risk of a blood clot forming that can cause a stroke if you have a medical condition called atrial fibrillation (a type of irregular heartbeat).  What do you need to know about xarelto ? Take your Xarelto ONCE DAILY at the same time every day with your evening meal. If you have difficulty swallowing the tablet whole, you may crush it and mix in applesauce just prior to taking your dose.  Take Xarelto exactly as prescribed by your doctor and DO NOT stop taking Xarelto without talking to the doctor who prescribed the medication.  Stopping without other stroke prevention medication to take the place of Xarelto may increase your risk of developing a clot that causes a stroke.  Refill your prescription before you run out.  After discharge, you should have regular check-up appointments with your healthcare provider that is prescribing your Xarelto.  In the future your dose may need to be changed if your kidney function or weight changes by a significant amount.  What do you do if you miss a dose? If you are taking Xarelto ONCE DAILY and you miss a dose, take it as soon as you remember on the same day then continue your regularly scheduled once daily regimen the next day. Do not take two doses of Xarelto at the same time or on the same day.   Important Safety Information A possible side effect of Xarelto is bleeding. You should call your  healthcare provider right away if you experience any of the following:   Bleeding from an injury or your nose that does not stop.   Unusual colored urine (red or dark brown) or unusual colored stools (red or black).   Unusual bruising for unknown reasons.   A serious fall or if you hit your head (even if there is no bleeding).  Some medicines may interact with Xarelto and might increase your risk of bleeding while on Xarelto. To help avoid this, consult your healthcare provider or pharmacist prior to using any new prescription or non-prescription medications, including herbals, vitamins, non-steroidal anti-inflammatory drugs (NSAIDs) and supplements.  This website has more information on Xarelto: [email or web address]-benson.[email or web address]  We also are scheduling you an echo of your heart.

## 2014-04-12 NOTE — Progress Notes (Signed)
Utilization Review Completed.[name] T7/29/2015  

## 2014-04-12 NOTE — Progress Notes (Signed)
[place] for heparin Indication: atrial fibrillation  Allergies  Allergen Reactions  . Penicillins Other (See Comments)    unknown    Patient Measurements: Height: 5\' 3"  (160 cm) Weight: 203 lb 11.3 oz (92.4 kg) IBW/kg (Calculated) : 52.4 Heparin Dosing Weight: 76 kg  Vital Signs: Temp: 98 F (36.7 C) (07/29 0446) Temp src: Oral (07/29 0446) BP: 104/55 mmHg (07/29 0710) Pulse Rate: 68 (07/29 0446)  Labs:  Recent Labs  04/11/14 1910 04/11/14 2143 04/12/14 0305 04/12/14 0355 04/12/14 1125  HGB 14.5  --   --  14.5  --   HCT 45.1  --   --  45.1  --   PLT 135*  --   --  206  --   LABPROT  --  13.9  --   --   --   INR  --  1.07  --   --   --   HEPARINUNFRC  --   --   --  0.43 0.52  CREATININE 1.14*  --  0.83  --   --   TROPONINI <0.30  --   --   --   --     Estimated Creatinine Clearance: 88.5 ml/min (by C-G formula based on Cr of 0.83).  Assessment: 48 yo female with Aflutter for heparin, noted she was noncompliant with warfarin and flecainide in the past. Admitted in AFlutter with RVR- has since converted. therapeutic x2 on heparin at 1050 units/hr. CBC is WNL, stable. No bleeding noted.  Goal of Therapy:  Heparin level 0.3-0.7 units/ml Monitor platelets by anticoagulation protocol: Yes   Plan:  1. Continue heparin at 1050 units/hr 2. Daily HL and CBC 3. Follow for cardiology plan regarding long term anti-coagulation  Katey Barrie D. Haleemah Buckalew, PharmD, BCPS Clinical Pharmacist Pager: [phone] 04/12/2014 12:14 PM

## 2014-04-12 NOTE — Progress Notes (Signed)
[place] for heparin Indication: atrial fibrillation  Allergies  Allergen Reactions  . Penicillins Other (See Comments)    unknown    Patient Measurements: Height: 5\' 3"  (160 cm) Weight: 203 lb 11.3 oz (92.4 kg) IBW/kg (Calculated) : 52.4 Heparin Dosing Weight: 76 kg  Vital Signs: Temp: 98 F (36.7 C) (07/29 0446) Temp src: Oral (07/29 0446) BP: 120/66 mmHg (07/29 0446) Pulse Rate: 68 (07/29 0446)  Labs:  Recent Labs  04/11/14 1910 04/11/14 2143 04/12/14 0305 04/12/14 0355  HGB 14.5  --   --  14.5  HCT 45.1  --   --  45.1  PLT 135*  --   --  206  LABPROT  --  13.9  --   --   INR  --  1.07  --   --   HEPARINUNFRC  --   --   --  0.43  CREATININE 1.14*  --  0.83  --   TROPONINI <0.30  --   --   --     Estimated Creatinine Clearance: 88.5 ml/min (by C-G formula based on Cr of 0.83). Assessment: 48 yo female with afib for heparin Goal of Therapy:  Heparin level 0.3-0.7 units/ml Monitor platelets by anticoagulation protocol: Yes   Plan:  Continue Heparin at current rate  Follow-up plan for anticoagulation  Phillis Knack, PharmD, BCPS   04/12/2014 5:53 AM

## 2014-04-12 NOTE — Progress Notes (Signed)
Noticed patient's HR was in the upper 50s and she had converted from atrial flutter to a bradycardia rhythm around 6:30  EKG was performed which illustrated a junctional rhythm HR 54.  Cardizem drip was stopped.  Notified Rhonda Barrett.  Orders received to hold metoprolol and cardizem drip and to monitor blood pressure.  BP was 104/55 upon assessment. Will continue to monitor.

## 2014-04-13 ENCOUNTER — Ambulatory Visit: Payer: Self-pay | Attending: Internal Medicine | Admitting: Internal Medicine

## 2014-04-13 ENCOUNTER — Ambulatory Visit: Payer: Self-pay

## 2014-04-13 ENCOUNTER — Encounter: Payer: Self-pay | Admitting: Internal Medicine

## 2014-04-13 VITALS — BP 118/77 | HR 55 | Temp 98.2°F | Resp 16 | Ht 63.0 in | Wt 203.0 lb

## 2014-04-13 DIAGNOSIS — R0602 Shortness of breath: Secondary | ICD-10-CM | POA: Insufficient documentation

## 2014-04-13 DIAGNOSIS — Z0000 Encounter for general adult medical examination without abnormal findings: Secondary | ICD-10-CM

## 2014-04-13 DIAGNOSIS — Z880 Allergy status to penicillin: Secondary | ICD-10-CM | POA: Insufficient documentation

## 2014-04-13 DIAGNOSIS — I10 Essential (primary) hypertension: Secondary | ICD-10-CM | POA: Insufficient documentation

## 2014-04-13 DIAGNOSIS — Z23 Encounter for immunization: Secondary | ICD-10-CM

## 2014-04-13 DIAGNOSIS — I4892 Unspecified atrial flutter: Secondary | ICD-10-CM | POA: Insufficient documentation

## 2014-04-13 DIAGNOSIS — I4891 Unspecified atrial fibrillation: Secondary | ICD-10-CM | POA: Insufficient documentation

## 2014-04-13 DIAGNOSIS — Z8673 Personal history of transient ischemic attack (TIA), and cerebral infarction without residual deficits: Secondary | ICD-10-CM | POA: Insufficient documentation

## 2014-04-13 MED ORDER — RIVAROXABAN 20 MG PO TABS: 20.0000 mg | ORAL_TABLET | Freq: Every day | ORAL | Status: DC

## 2014-04-13 MED ORDER — HYDRALAZINE HCL 50 MG PO TABS: 50.0000 mg | ORAL_TABLET | Freq: Three times a day (TID) | ORAL | Status: DC

## 2014-04-13 MED ORDER — HYDROCHLOROTHIAZIDE 12.5 MG PO CAPS: 12.5000 mg | ORAL_CAPSULE | Freq: Every day | ORAL | Status: DC

## 2014-04-13 NOTE — Progress Notes (Signed)
HFU Pt was in the ED for atrial flutter. Pt reports that she feels fine today. Pt has questions about her new medication xarelto.

## 2014-04-13 NOTE — Patient Instructions (Signed)
DASH Eating Plan DASH stands for "Dietary Approaches to Stop Hypertension." The DASH eating plan is a healthy eating plan that has been shown to reduce high blood pressure (hypertension). Additional health benefits may include reducing the risk of type 2 diabetes mellitus, heart disease, and stroke. The DASH eating plan may also help with weight loss. WHAT DO I NEED TO KNOW ABOUT THE DASH EATING PLAN? For the DASH eating plan, you will follow these general guidelines:  Choose foods with a percent daily value for sodium of less than 5% (as listed on the food label).  Use salt-free seasonings or herbs instead of table salt or sea salt.  Check with your health care provider or pharmacist before using salt substitutes.  Eat lower-sodium products, often labeled as "lower sodium" or "no salt added."  Eat fresh foods.  Eat more vegetables, fruits, and low-fat dairy products.  Choose whole grains. Look for the word "whole" as the first word in the ingredient list.  Choose fish and skinless chicken or turkey more often than red meat. Limit fish, poultry, and meat to 6 oz (170 g) each day.  Limit sweets, desserts, sugars, and sugary drinks.  Choose heart-healthy fats.  Limit cheese to 1 oz (28 g) per day.  Eat more home-cooked food and less restaurant, buffet, and fast food.  Limit fried foods.  [place] foods using methods other than frying.  Limit canned vegetables. If you do use them, rinse them well to decrease the sodium.  When eating at a restaurant, ask that your food be prepared with less salt, or no salt if possible. WHAT FOODS CAN I EAT? Seek help from a dietitian for individual calorie needs. Grains Whole grain or whole wheat bread. Brown rice. Whole grain or whole wheat pasta. Quinoa, bulgur, and whole grain cereals. Low-sodium cereals. Corn or whole wheat flour tortillas. Whole grain cornbread. Whole grain crackers. Low-sodium crackers. Vegetables Fresh or frozen vegetables  (raw, steamed, roasted, or grilled). Low-sodium or reduced-sodium tomato and vegetable juices. Low-sodium or reduced-sodium tomato sauce and paste. Low-sodium or reduced-sodium canned vegetables.  Fruits All fresh, canned (in natural juice), or frozen fruits. Meat and Other Protein Products Ground beef (85% or leaner), grass-fed beef, or beef trimmed of fat. Skinless chicken or turkey. Ground chicken or turkey. Pork trimmed of fat. All fish and seafood. Eggs. Dried beans, peas, or lentils. Unsalted nuts and seeds. Unsalted canned beans. Dairy Low-fat dairy products, such as skim or 1% milk, 2% or reduced-fat cheeses, low-fat ricotta or cottage cheese, or plain low-fat yogurt. Low-sodium or reduced-sodium cheeses. Fats and Oils Tub margarines without trans fats. Light or reduced-fat mayonnaise and salad dressings (reduced sodium). Avocado. Safflower, olive, or canola oils. Natural peanut or almond butter. Other Unsalted popcorn and pretzels. The items listed above may not be a complete list of recommended foods or beverages. Contact your dietitian for more options. WHAT FOODS ARE NOT RECOMMENDED? Grains White bread. White pasta. White rice. Refined cornbread. Bagels and croissants. Crackers that contain trans fat. Vegetables Creamed or fried vegetables. Vegetables in a cheese sauce. Regular canned vegetables. Regular canned tomato sauce and paste. Regular tomato and vegetable juices. Fruits Dried fruits. Canned fruit in light or heavy syrup. Fruit juice. Meat and Other Protein Products Fatty cuts of meat. Ribs, chicken wings, bacon, sausage, bologna, salami, chitterlings, fatback, hot dogs, bratwurst, and packaged luncheon meats. Salted nuts and seeds. Canned beans with salt. Dairy Whole or 2% milk, cream, half-and-half, and cream cheese. Whole-fat or sweetened yogurt. Full-fat   cheeses or blue cheese. Nondairy creamers and whipped toppings. Processed cheese, cheese spreads, or cheese  curds. Condiments Onion and garlic salt, seasoned salt, table salt, and sea salt. Canned and packaged gravies. Worcestershire sauce. Tartar sauce. Barbecue sauce. Teriyaki sauce. Soy sauce, including reduced sodium. Steak sauce. Fish sauce. Oyster sauce. Cocktail sauce. Horseradish. Ketchup and mustard. Meat flavorings and tenderizers. Bouillon cubes. Hot sauce. Tabasco sauce. Marinades. Taco seasonings. Relishes. Fats and Oils Butter, stick margarine, lard, shortening, ghee, and bacon fat. Coconut, palm kernel, or palm oils. Regular salad dressings. Other Pickles and olives. Salted popcorn and pretzels. The items listed above may not be a complete list of foods and beverages to avoid. Contact your dietitian for more information. WHERE CAN I FIND MORE INFORMATION? National Heart, Lung, and Blood Institute: travelstabloid.com Document Released: 08/21/2011 Document Revised: 01/16/2014 Document Reviewed: 07/06/2013 [facility] Patient Information 2015 [place], [place]. This information is not intended to replace advice given to you by your health care provider. Make sure you discuss any questions you have with your health care provider. Hypertension Hypertension, commonly called high blood pressure, is when the force of blood pumping through your arteries is too strong. Your arteries are the blood vessels that carry blood from your heart throughout your body. A blood pressure reading consists of a higher number over a lower number, such as 110/72. The higher number (systolic) is the pressure inside your arteries when your heart pumps. The lower number (diastolic) is the pressure inside your arteries when your heart relaxes. Ideally you want your blood pressure below 120/80. Hypertension forces your heart to work harder to pump blood. Your arteries may become narrow or stiff. Having hypertension puts you at risk for heart disease, stroke, and other problems.  RISK  FACTORS Some risk factors for high blood pressure are controllable. Others are not.  Risk factors you cannot control include:   Race. You may be at higher risk if you are African American.  Age. Risk increases with age.  Gender. Men are at higher risk than women before age 37 years. After age 55, women are at higher risk than men. Risk factors you can control include:  Not getting enough exercise or physical activity.  Being overweight.  Getting too much fat, sugar, calories, or salt in your diet.  Drinking too much alcohol. SIGNS AND SYMPTOMS Hypertension does not usually cause signs or symptoms. Extremely high blood pressure (hypertensive crisis) may cause headache, anxiety, shortness of breath, and nosebleed. DIAGNOSIS  To check if you have hypertension, your health care provider will measure your blood pressure while you are seated, with your arm held at the level of your heart. It should be measured at least twice using the same arm. Certain conditions can cause a difference in blood pressure between your right and left arms. A blood pressure reading that is higher than normal on one occasion does not mean that you need treatment. If one blood pressure reading is high, ask your health care provider about having it checked again. TREATMENT  Treating high blood pressure includes making lifestyle changes and possibly taking medicine. Living a healthy lifestyle can help lower high blood pressure. You may need to change some of your habits. Lifestyle changes may include:  Following the DASH diet. This diet is high in fruits, vegetables, and whole grains. It is low in salt, red meat, and added sugars.  Getting at least 2 hours of brisk physical activity every week.  Losing weight if necessary.  Not smoking.  Limiting  alcoholic beverages.  Learning ways to reduce stress. If lifestyle changes are not enough to get your blood pressure under control, your health care provider may  prescribe medicine. You may need to take more than one. Work closely with your health care provider to understand the risks and benefits. HOME CARE INSTRUCTIONS  Have your blood pressure rechecked as directed by your health care provider.   Take medicines only as directed by your health care provider. Follow the directions carefully. Blood pressure medicines must be taken as prescribed. The medicine does not work as well when you skip doses. Skipping doses also puts you at risk for problems.   Do not smoke.   Monitor your blood pressure at home as directed by your health care provider. SEEK MEDICAL CARE IF:   You think you are having a reaction to medicines taken.  You have recurrent headaches or feel dizzy.  You have swelling in your ankles.  You have trouble with your vision. SEEK IMMEDIATE MEDICAL CARE IF:  You develop a severe headache or confusion.  You have unusual weakness, numbness, or feel faint.  You have severe chest or abdominal pain.  You vomit repeatedly.  You have trouble breathing. MAKE SURE YOU:   Understand these instructions.  Will watch your condition.  Will get help right away if you are not doing well or get worse. Document Released: 09/01/2005 Document Revised: 01/16/2014 Document Reviewed: 06/24/2013 [facility] Patient Information 2015 [place], [place]. This information is not intended to replace advice given to you by your health care provider. Make sure you discuss any questions you have with your health care provider. Rivaroxaban oral tablets What is this medicine? RIVAROXABAN (ri va ROX a ban) is an anticoagulant (blood thinner). It is used to treat blood clots in the lungs or in the veins. It is also used after knee or hip surgeries to prevent blood clots. It is also used to lower the chance of stroke in people with a medical condition called atrial fibrillation. This medicine may be used for other purposes; ask your health care provider or  pharmacist if you have questions. COMMON BRAND NAME(S): Xarelto, Xarelto Starter Pack What should I tell my health care provider before I take this medicine? They need to know if you have any of these conditions: -bleeding disorders -bleeding in the brain -blood in your stools (black or tarry stools) or if you have blood in your vomit -history of stomach bleeding -kidney disease -liver disease -low blood counts, like low white cell, platelet, or red cell counts -recent or planned spinal or epidural procedure -take medicines that treat or prevent blood clots -an unusual or allergic reaction to rivaroxaban, other medicines, foods, dyes, or preservatives -pregnant or trying to get pregnant -breast-feeding How should I use this medicine? Take this medicine by mouth with a glass of water. Follow the directions on the prescription label. Take your medicine at regular intervals. Do not take it more often than directed. Do not stop taking except on your doctor's advice. Stopping this medicine may increase your risk of a blot clot. Be sure to refill your prescription before you run out of medicine. If you are taking this medicine after hip or knee replacement surgery, take it with or without food. If you are taking this medicine for atrial fibrillation, take it with your evening meal. If you are taking this medicine to treat blood clots, take it with food at the same time each day. If you are unable to swallow your  tablet, you may crush the tablet and mix it in applesauce. Then, immediately eat the applesauce. You should eat more food right after you eat the applesauce containing the crushed tablet. Talk to your pediatrician regarding the use of this medicine in children. Special care may be needed. Overdosage: If you think you have taken too much of this medicine contact a poison control center or emergency room at once. NOTE: This medicine is only for you. Do not share this medicine with others. What  if I miss a dose? If you take your medicine once a day and miss a dose, take the missed dose as soon as you remember. If you take your medicine twice a day and miss a dose, take the missed dose immediately. In this instance, 2 tablets may be taken at the same time. The next day you should take 1 tablet twice a day as directed. What may interact with this medicine? -aspirin and aspirin-like medicines -certain antibiotics like erythromycin, azithromycin, and clarithromycin -certain medicines for fungal infections like ketoconazole and itraconazole -certain medicines for irregular heart beat like amiodarone, quinidine, dronedarone -certain medicines for seizures like carbamazepine, phenytoin -certain medicines that treat or prevent blood clots like warfarin, enoxaparin, and dalteparin -conivaptan -diltiazem -felodipine -indinavir -lopinavir; ritonavir -NSAIDS, medicines for pain and inflammation, like ibuprofen or naproxen -ranolazine -rifampin -ritonavir -St. John's wort -verapamil This list may not describe all possible interactions. Give your health care provider a list of all the medicines, herbs, non-prescription drugs, or dietary supplements you use. Also tell them if you smoke, drink alcohol, or use illegal drugs. Some items may interact with your medicine. What should I watch for while using this medicine? Visit your doctor or health care professional for regular checks on your progress. Your condition will be monitored carefully while you are receiving this medicine. Notify your doctor or health care professional and seek emergency treatment if you develop breathing problems; changes in vision; chest pain; severe, sudden headache; pain, swelling, warmth in the leg; trouble speaking; sudden numbness or weakness of the face, arm, or leg. These can be signs that your condition has gotten worse. If you are going to have surgery, tell your doctor or health care professional that you are  taking this medicine. Tell your health care professional that you use this medicine before you have a spinal or epidural procedure. Sometimes people who take this medicine have bleeding problems around the spine when they have a spinal or epidural procedure. This bleeding is very rare. If you have a spinal or epidural procedure while on this medicine, call your health care professional immediately if you have back pain, numbness or tingling (especially in your legs and feet), muscle weakness, paralysis, or loss of bladder or bowel control. Avoid sports and activities that might cause injury while you are using this medicine. Severe falls or injuries can cause unseen bleeding. Be careful when using sharp tools or knives. Consider using an Copy. Take special care brushing or flossing your teeth. Report any injuries, bruising, or red spots on the skin to your doctor or health care professional. What side effects may I notice from receiving this medicine? Side effects that you should report to your doctor or health care professional as soon as possible: -allergic reactions like skin rash, itching or hives, swelling of the face, lips, or tongue -back pain -redness, blistering, peeling or loosening of the skin, including inside the mouth -signs and symptoms of bleeding such as bloody or black, tarry stools; red  or dark-brown urine; spitting up blood or brown material that looks like coffee grounds; red spots on the skin; unusual bruising or bleeding from the eye, gums, or nose Side effects that usually do not require medical attention (Report these to your doctor or health care professional if they continue or are bothersome.): -dizziness -muscle pain This list may not describe all possible side effects. Call your doctor for medical advice about side effects. You may report side effects to FDA at 1-800-FDA-1088. Where should I keep my medicine? Keep out of the reach of children. Store at room  temperature between 15 and 30 degrees C (59 and 86 degrees F). Throw away any unused medicine after the expiration date. NOTE: This sheet is a summary. It may not cover all possible information. If you have questions about this medicine, talk to your doctor, pharmacist, or health care provider.  2015, [employer]vier/[employer] Standard. (2013-12-22 18:47:48)

## 2014-04-13 NOTE — Progress Notes (Signed)
[name], is a 48 y.o. female  VPX:[id number]  IOE:[id number]  DOB - Sep 19, 1965  Chief Complaint  Patient presents with  . Hospitalization Follow-up        Subjective:   [name] is a 48 y.o. female here today for a follow up visit. Patient was recently seen in the clinic and sent to the emergency department for atrial flutter and rapid ventricular response, she was admitted for appropriate treatment, started on Xarelto. Patient is known for noncompliance with her medications and has been extensively counseled. She is here today for followup hospital admission, she has not started any of her medications because she has no prescription according to her. She has no new complaints, she says she feels good. She has an appointment with electrophysiologist coming up as well as cardiologist. Her medications include hydralazine, metoprolol, hydrochlorothiazide and Xarelto. She needs refills on all of her medications. Patient has No headache, No chest pain, No abdominal pain - No Nausea, No new weakness tingling or numbness, No Cough - SOB.  No problems updated.  ALLERGIES: Allergies  Allergen Reactions  . Penicillins Other (See Comments)    unknown    PAST MEDICAL HISTORY: Past Medical History  Diagnosis Date  . Paroxysmal a-fib   . Shortness of breath   . Hypertension   . Transient ischemic attack (TIA) 01/2013  . Encounter for long-term (current) use of other medications   . Heart murmur   . Pneumonia 02/2011; 08/2011; 03/22/2014    MEDICATIONS AT HOME: Prior to Admission medications   Medication Sig Start Date End Date Taking? Authorizing Provider  hydrALAZINE (APRESOLINE) 50 MG tablet Take 1 tablet (50 mg total) by mouth 3 (three) times daily. 04/13/14  Yes Angelica Chessman, MD  hydrochlorothiazide (MICROZIDE) 12.5 MG capsule Take 1 capsule (12.5 mg total) by mouth daily. 04/13/14  Yes Angelica Chessman, MD  metoprolol tartrate (LOPRESSOR) 25 MG tablet Take 1 tablet (25 mg  total) by mouth 2 (two) times daily. 02/22/14  Yes Renella Cunas, MD  rivaroxaban (XARELTO) 20 MG TABS tablet Take 1 tablet (20 mg total) by mouth daily with supper. 04/13/14  Yes Angelica Chessman, MD     Objective:   Filed Vitals:   04/13/14 1054  BP: 118/77  Pulse: 55  Temp: 98.2 F (36.8 C)  TempSrc: Oral  Resp: 16  Height: 5\' 3"  (1.6 m)  Weight: 203 lb (92.08 kg)  SpO2: 98%    Exam General appearance : Awake, alert, not in any distress. Speech Clear. Not toxic looking HEENT: Atraumatic and Normocephalic, pupils equally reactive to light and accomodation Neck: supple, no JVD. No cervical lymphadenopathy.  Chest:Good air entry bilaterally, no added sounds  CVS: S1 S2 regular, no murmurs.  Abdomen: Bowel sounds present, Non tender and not distended with no gaurding, rigidity or rebound. Extremities: B/L Lower Ext shows no edema, both legs are warm to touch Neurology: Awake alert, and oriented X 3, CN II-XII intact, Non focal Skin:No Rash Wounds:N/A  Data Review Lab Results  Component Value Date   HGBA1C 5.7* 02/06/2013   HGBA1C 5.7* 02/05/2013   HGBA1C 6.6* 03/07/2011     Assessment & Plan   1. Atrial flutter, unspecified  - rivaroxaban (XARELTO) 20 MG TABS tablet; Take 1 tablet (20 mg total) by mouth daily with supper.  Dispense: 90 tablet; Refill: 3  2. Hypertension, essential, benign  - hydrALAZINE (APRESOLINE) 50 MG tablet; Take 1 tablet (50 mg total) by mouth 3 (three) times daily.  Dispense:  180 tablet; Refill: 3 - hydrochlorothiazide (MICROZIDE) 12.5 MG capsule; Take 1 capsule (12.5 mg total) by mouth daily.  Dispense: 90 capsule; Refill: 3 DASH diet  Patient was again counseled extensively about medication compliance, it was educated of the consequences of noncompliance with medications and uncontrolled hypertension and/or atrial flutter. Patient verbalized understanding. Patient was counseled on nutrition and exercise  Return in about 3 months (around  07/14/2014), or if symptoms worsen or fail to improve, for Follow up HTN, Atrial Flutter.  The patient was given clear instructions to go to ER or return to medical center if symptoms don't improve, worsen or new problems develop. The patient verbalized understanding. The patient was told to call to get lab results if they haven't heard anything in the next week.   This note has been created with Surveyor, quantity. Any transcriptional errors are unintentional.    Angelica Chessman, MD, [place], [place], [place] and [facility] [place], [place]   04/13/2014, 11:37 AM

## 2014-04-18 ENCOUNTER — Other Ambulatory Visit: Payer: Self-pay | Admitting: Internal Medicine

## 2014-04-18 DIAGNOSIS — I4892 Unspecified atrial flutter: Secondary | ICD-10-CM

## 2014-04-18 MED ORDER — RIVAROXABAN 20 MG PO TABS: 20.0000 mg | ORAL_TABLET | Freq: Every day | ORAL | Status: DC

## 2014-04-20 ENCOUNTER — Other Ambulatory Visit (HOSPITAL_COMMUNITY): Payer: Self-pay

## 2014-04-26 ENCOUNTER — Other Ambulatory Visit (HOSPITAL_COMMUNITY): Payer: Self-pay

## 2014-05-03 ENCOUNTER — Encounter (HOSPITAL_COMMUNITY): Payer: Self-pay | Admitting: Cardiovascular Disease

## 2014-05-04 ENCOUNTER — Other Ambulatory Visit (HOSPITAL_COMMUNITY): Payer: Self-pay

## 2014-05-05 ENCOUNTER — Encounter (HOSPITAL_COMMUNITY): Payer: Self-pay | Admitting: Cardiology

## 2014-05-08 ENCOUNTER — Encounter: Payer: Self-pay | Admitting: Internal Medicine

## 2014-05-24 ENCOUNTER — Ambulatory Visit: Payer: Self-pay | Admitting: Internal Medicine

## 2014-05-29 ENCOUNTER — Telehealth: Payer: Self-pay | Admitting: *Deleted

## 2014-05-29 NOTE — Telephone Encounter (Signed)
Message copied by Dionicio Stall on Mon May 29, 2014  8:44 AM ------      Message from: Vashti Hey D      Created: Mon May 08, 2014  3:37 PM      Regarding: ECHO       05/08/14 Patient cancel and no-show x3 ------

## 2014-06-14 ENCOUNTER — Other Ambulatory Visit: Payer: Self-pay

## 2014-06-14 MED ORDER — METOPROLOL TARTRATE 25 MG PO TABS: 25.0000 mg | ORAL_TABLET | Freq: Two times a day (BID) | ORAL | Status: DC

## 2014-09-16 ENCOUNTER — Emergency Department (HOSPITAL_COMMUNITY): Payer: 59

## 2014-09-16 ENCOUNTER — Inpatient Hospital Stay (HOSPITAL_COMMUNITY)
Admission: EM | Admit: 2014-09-16 | Discharge: 2014-09-17 | DRG: 194 | Disposition: A | Discharge status: Home / Self Care | Payer: 59 | Attending: Internal Medicine | Admitting: Internal Medicine

## 2014-09-16 ENCOUNTER — Encounter (HOSPITAL_COMMUNITY): Payer: Self-pay | Admitting: *Deleted

## 2014-09-16 DIAGNOSIS — R05 Cough: Secondary | ICD-10-CM

## 2014-09-16 DIAGNOSIS — Z21 Asymptomatic human immunodeficiency virus [HIV] infection status: Secondary | ICD-10-CM | POA: Diagnosis present

## 2014-09-16 DIAGNOSIS — Z7901 Long term (current) use of anticoagulants: Secondary | ICD-10-CM

## 2014-09-16 DIAGNOSIS — I480 Paroxysmal atrial fibrillation: Secondary | ICD-10-CM | POA: Diagnosis present

## 2014-09-16 DIAGNOSIS — Z8249 Family history of ischemic heart disease and other diseases of the circulatory system: Secondary | ICD-10-CM

## 2014-09-16 DIAGNOSIS — Z8673 Personal history of transient ischemic attack (TIA), and cerebral infarction without residual deficits: Secondary | ICD-10-CM | POA: Diagnosis not present

## 2014-09-16 DIAGNOSIS — Z9981 Dependence on supplemental oxygen: Secondary | ICD-10-CM | POA: Diagnosis not present

## 2014-09-16 DIAGNOSIS — E876 Hypokalemia: Secondary | ICD-10-CM | POA: Diagnosis present

## 2014-09-16 DIAGNOSIS — R059 Cough, unspecified: Secondary | ICD-10-CM

## 2014-09-16 DIAGNOSIS — Z880 Allergy status to penicillin: Secondary | ICD-10-CM

## 2014-09-16 DIAGNOSIS — I10 Essential (primary) hypertension: Secondary | ICD-10-CM | POA: Diagnosis present

## 2014-09-16 DIAGNOSIS — I4892 Unspecified atrial flutter: Secondary | ICD-10-CM | POA: Diagnosis present

## 2014-09-16 DIAGNOSIS — J189 Pneumonia, unspecified organism: Secondary | ICD-10-CM | POA: Diagnosis present

## 2014-09-16 DIAGNOSIS — J449 Chronic obstructive pulmonary disease, unspecified: Secondary | ICD-10-CM | POA: Diagnosis present

## 2014-09-16 DIAGNOSIS — Z87891 Personal history of nicotine dependence: Secondary | ICD-10-CM

## 2014-09-16 DIAGNOSIS — I82409 Acute embolism and thrombosis of unspecified deep veins of unspecified lower extremity: Secondary | ICD-10-CM

## 2014-09-16 LAB — I-STAT TROPONIN, ED: TROPONIN I, POC: 0 ng/mL (ref 0.00–0.08)

## 2014-09-16 LAB — COMPREHENSIVE METABOLIC PANEL
ALT: 15 U/L (ref 0–35)
AST: 22 U/L (ref 0–37)
Albumin: 3.2 g/dL — ABNORMAL LOW (ref 3.5–5.2)
Alkaline Phosphatase: 69 U/L (ref 39–117)
Anion gap: 10 (ref 5–15)
BUN: 10 mg/dL (ref 6–23)
CALCIUM: 8.9 mg/dL (ref 8.4–10.5)
CO2: 26 mmol/L (ref 19–32)
CREATININE: 0.91 mg/dL (ref 0.50–1.10)
Chloride: 105 mEq/L (ref 96–112)
GFR calc non Af Amer: 72 mL/min — ABNORMAL LOW (ref >90)
GFR, EST AFRICAN AMERICAN: 84 mL/min — AB (ref >90)
GLUCOSE: 133 mg/dL — AB (ref 70–99)
Potassium: 3 mmol/L — ABNORMAL LOW (ref 3.5–5.1)
Sodium: 141 mmol/L (ref 135–145)
TOTAL PROTEIN: 7 g/dL (ref 6.0–8.3)
Total Bilirubin: 0.3 mg/dL (ref 0.3–1.2)

## 2014-09-16 LAB — URINALYSIS, ROUTINE W REFLEX MICROSCOPIC
Bilirubin Urine: NEGATIVE
GLUCOSE, UA: NEGATIVE mg/dL
Ketones, ur: NEGATIVE mg/dL
Nitrite: NEGATIVE
PH: 7.5 (ref 5.0–8.0)
Protein, ur: NEGATIVE mg/dL
Specific Gravity, Urine: 1.015 (ref 1.005–1.030)
UROBILINOGEN UA: 0.2 mg/dL (ref 0.0–1.0)

## 2014-09-16 LAB — URINE MICROSCOPIC-ADD ON

## 2014-09-16 LAB — INFLUENZA PANEL BY PCR (TYPE A & B)
H1N1 flu by pcr: NOT DETECTED
INFLBPCR: NEGATIVE
Influenza A By PCR: NEGATIVE

## 2014-09-16 LAB — CBC WITH DIFFERENTIAL/PLATELET
BASOS ABS: 0 10*3/uL (ref 0.0–0.1)
Basophils Relative: 0 % (ref 0–1)
EOS ABS: 0.1 10*3/uL (ref 0.0–0.7)
EOS PCT: 1 % (ref 0–5)
HEMATOCRIT: 39.5 % (ref 36.0–46.0)
Hemoglobin: 12.7 g/dL (ref 12.0–15.0)
LYMPHS PCT: 16 % (ref 12–46)
Lymphs Abs: 2 10*3/uL (ref 0.7–4.0)
MCH: 27.1 pg (ref 26.0–34.0)
MCHC: 32.2 g/dL (ref 30.0–36.0)
MCV: 84.4 fL (ref 78.0–100.0)
MONO ABS: 0.4 10*3/uL (ref 0.1–1.0)
Monocytes Relative: 3 % (ref 3–12)
Neutro Abs: 10.3 10*3/uL — ABNORMAL HIGH (ref 1.7–7.7)
Neutrophils Relative %: 80 % — ABNORMAL HIGH (ref 43–77)
Platelets: 208 10*3/uL (ref 150–400)
RBC: 4.68 MIL/uL (ref 3.87–5.11)
RDW: 13.9 % (ref 11.5–15.5)
WBC: 12.8 10*3/uL — AB (ref 4.0–10.5)

## 2014-09-16 LAB — PROTIME-INR
INR: 1.03 (ref 0.00–1.49)
Prothrombin Time: 13.6 seconds (ref 11.6–15.2)

## 2014-09-16 LAB — BRAIN NATRIURETIC PEPTIDE: B Natriuretic Peptide: 178.2 pg/mL — ABNORMAL HIGH (ref 0.0–100.0)

## 2014-09-16 LAB — I-STAT CG4 LACTIC ACID, ED: Lactic Acid, Venous: 1.7 mmol/L (ref 0.5–2.2)

## 2014-09-16 MED ORDER — IPRATROPIUM-ALBUTEROL 0.5-2.5 (3) MG/3ML IN SOLN: 3.0000 mL | RESPIRATORY_TRACT | Status: DC | PRN

## 2014-09-16 MED ORDER — SODIUM CHLORIDE 0.9 % IV SOLN
INTRAVENOUS | Status: AC
  Administered 2014-09-16: 15:00:00 via INTRAVENOUS

## 2014-09-16 MED ORDER — RIVAROXABAN 20 MG PO TABS
20.0000 mg | ORAL_TABLET | Freq: Every day | ORAL | Status: DC
  Administered 2014-09-16 – 2014-09-17 (×2): 20 mg via ORAL
  Filled 2014-09-16 (×2): qty 1

## 2014-09-16 MED ORDER — LEVOFLOXACIN IN D5W 750 MG/150ML IV SOLN
750.0000 mg | Freq: Once | INTRAVENOUS | Status: AC
  Administered 2014-09-16: 750 mg via INTRAVENOUS
  Filled 2014-09-16: qty 150

## 2014-09-16 MED ORDER — SODIUM CHLORIDE 0.9 % IJ SOLN
3.0000 mL | Freq: Two times a day (BID) | INTRAMUSCULAR | Status: DC
  Administered 2014-09-17: 3 mL via INTRAVENOUS

## 2014-09-16 MED ORDER — IPRATROPIUM-ALBUTEROL 0.5-2.5 (3) MG/3ML IN SOLN
3.0000 mL | Freq: Once | RESPIRATORY_TRACT | Status: AC
  Administered 2014-09-16: 3 mL via RESPIRATORY_TRACT
  Filled 2014-09-16: qty 3

## 2014-09-16 MED ORDER — CEFTRIAXONE SODIUM IN DEXTROSE 20 MG/ML IV SOLN
1.0000 g | INTRAVENOUS | Status: DC
  Administered 2014-09-16: 1 g via INTRAVENOUS
  Filled 2014-09-16 (×2): qty 50

## 2014-09-16 MED ORDER — METOPROLOL TARTRATE 25 MG PO TABS
25.0000 mg | ORAL_TABLET | Freq: Two times a day (BID) | ORAL | Status: DC
Start: 2014-09-16 — End: 2014-09-17
  Administered 2014-09-16 – 2014-09-17 (×2): 25 mg via ORAL
  Filled 2014-09-16 (×3): qty 1

## 2014-09-16 MED ORDER — POTASSIUM CHLORIDE CRYS ER 20 MEQ PO TBCR
40.0000 meq | EXTENDED_RELEASE_TABLET | Freq: Once | ORAL | Status: AC
  Administered 2014-09-16: 40 meq via ORAL
  Filled 2014-09-16: qty 2

## 2014-09-16 MED ORDER — DEXTROSE 5 % IV SOLN
500.0000 mg | INTRAVENOUS | Status: DC
  Administered 2014-09-17: 500 mg via INTRAVENOUS
  Filled 2014-09-16: qty 500

## 2014-09-16 NOTE — ED Notes (Signed)
Patient transported to x-ray. ?

## 2014-09-16 NOTE — ED Notes (Signed)
Pt reports having a productive cough since last night, clear sputum. Denies fever or recent swelling. No distress noted at triage.

## 2014-09-16 NOTE — H&P (Signed)
Date: 09/16/2014               Patient Name:  [name] MRN: [id number]  DOB: 30-Aug-1966 Age / Sex: 49 y.o., female   PCP: Tresa Garter, MD         Medical Service: Internal Medicine Teaching Service         Attending Physician: Dr. Sid Falcon, MD    First Contact: Dr. Marvel Plan Pager: [phone]  Second Contact: Dr. Ronnald Ramp Pager: [phone]       After Hours (After 5p/  First Contact Pager: [phone]  weekends / holidays): Second Contact Pager: [phone]   Chief Complaint: productive cough and shortness of breath  History of Present Illness: [name] is a 49 yo female with PMHx of COPD, TIA, HTN and h/o aflutter on Xarelto who presented to the ED with complaint of shortness of breath and productive cough. Patient states that she went to bed this morning around 1 am and awoke with productive cough yellow and tinged with blood and a "rattling" in her chest. She states she knew she had pneumonia because she has had these exact symptoms several times before. Instead of trying any medications or waiting, she decided to come to the ED. She denies any symptoms of fever, chills, chest pain, shortness of breath, sinus congestion, sinus pressure, sore throat, nausea, vomiting or dizziness. Patient denies any history of COPD. She is a previous tobacco use- only 1 pp 3 days for 2 years at the most. She quit 3 years ago. 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 She did not have the flu shot.   Meds: Current Facility-Administered Medications  Medication Dose Route Frequency Provider Last Rate Last Dose  . 0.9 %  sodium chloride infusion   Intravenous STAT Wandra Arthurs, MD 125 mL/hr at 09/16/14 1511    . [START ON 09/17/2014] azithromycin (ZITHROMAX) 500 mg in dextrose 5 % 250 mL IVPB  500 mg Intravenous Q24H Corky Sox, MD      . cefTRIAXone (ROCEPHIN) 1 g in dextrose 5 % 50 mL IVPB - Premix  1 g Intravenous Q24H Cassie L Stewart, RPH      . ipratropium-albuterol  (DUONEB) 0.5-2.5 (3) MG/3ML nebulizer solution 3 mL  3 mL Nebulization Q4H PRN Corky Sox, MD      . metoprolol tartrate (LOPRESSOR) tablet 25 mg  25 mg Oral BID Corky Sox, MD      . rivaroxaban Alveda Reasons) tablet 20 mg  20 mg Oral Q supper Corky Sox, MD      . sodium chloride 0.9 % injection 3 mL  3 mL Intravenous Q12H Corky Sox, MD   3 mL at 09/16/14 1514    Allergies: Allergies as of 09/16/2014 - Review Complete 09/16/2014  Allergen Reaction Noted  . Penicillins Other (See Comments) 09/06/2011   Past Medical History  Diagnosis Date  . Paroxysmal a-fib   . Shortness of breath   . Hypertension   . Transient ischemic attack (TIA) 01/2013  . Encounter for long-term (current) use of other medications   . Heart murmur   . Pneumonia 02/2011; 08/2011; 03/22/2014   Past Surgical History  Procedure Laterality Date  . Tee without cardioversion N/A 02/18/2013    Procedure: TRANSESOPHAGEAL ECHOCARDIOGRAM (TEE);  Surgeon: Sanda Klein, MD;  Location: [place];  Service: Cardiovascular;  Laterality: N/A;  . Cardioversion N/A 02/18/2013    Procedure: CARDIOVERSION;  Surgeon: Sanda Klein,  MD;  Location: [place];  Service: Cardiovascular;  Laterality: N/A;  . Cardioversion N/A 02/23/2013    Procedure: CARDIOVERSION;  Surgeon: Leonie Man, MD;  Location: [place];  Service: Cardiovascular;  Laterality: N/A;  BESIDE CV  . Tubal ligation  1992   Family History  Problem Relation Age of Onset  . Hypertension    . Diabetes    . Cancer Mother   . Hypertension Father   . Atrial fibrillation Father   . Peripheral vascular disease Father    History   Social History  . Marital Status: Single    Spouse Name: N/A    Number of Children: N/A  . Years of Education: N/A   Occupational History  . assembly line    Social History Main Topics  . Smoking status: Former Smoker -- 3 years    Types: Cigars    Quit date: 03/15/2013  . Smokeless tobacco: Never Used  . Alcohol Use: 3.6  oz/week    6 Cans of beer per week  . Drug Use: No  . Sexual Activity: Not Currently    Birth Control/ Protection: None   Other Topics Concern  . Not on file   Social History Narrative    Review of Systems: General: Denies fever, chills, and fatigue  Respiratory: Admits to productive cough, but denies SOB, chest tightness, and wheezing.   Cardiovascular: Denies chest pain and palpitations.  Gastrointestinal: Denies nausea, vomiting Neurological: Denies dizziness, headaches, weakness, lightheadedness  Physical Exam: Filed Vitals:   09/16/14 1200 09/16/14 1230 09/16/14 1300 09/16/14 1401  BP: 138/72 132/95 133/67 112/64  Pulse: 57 59 56 57  Temp:    98.2 F (36.8 C)  TempSrc:    Oral  Resp: 29 30 28 22   Height:    5\' 3"  (1.6 m)  Weight:    87.091 kg (192 lb)  SpO2: 92% 94% 97% 100%   General: Vital signs reviewed.  Patient is well-developed and well-nourished, in no acute distress and cooperative with exam.  Cardiovascular: Bradycardic, regular rhythm, S1 normal, S2 normal, no murmurs, gallops, or rubs. Pulmonary/Chest: Crackles auscultated on right extending up to mid lung, no wheezes or rhonchi. Abdominal: Soft, non-tender, non-distended, BS + Extremities: No lower extremity edema bilaterally Neurological: A&O x3 Skin: Warm, dry and intact. No rashes or erythema. Psychiatric: Normal mood and affect. speech and behavior is normal. Cognition and memory are normal.   Lab results: Basic Metabolic Panel:  Recent Labs  09/16/14 1025  NA 141  K 3.0*  CL 105  CO2 26  GLUCOSE 133*  BUN 10  CREATININE 0.91  CALCIUM 8.9   Liver Function Tests:  Recent Labs  09/16/14 1025  AST 22  ALT 15  ALKPHOS 69  BILITOT 0.3  PROT 7.0  ALBUMIN 3.2*   CBC:  Recent Labs  09/16/14 1025  WBC 12.8*  NEUTROABS 10.3*  HGB 12.7  HCT 39.5  MCV 84.4  PLT 208   Coagulation:  Recent Labs  09/16/14 1025  LABPROT 13.6  INR 1.03   Urine Drug Screen: Drugs of Abuse      Component Value Date/Time   LABOPIA NONE DETECTED 02/05/2013 0749   COCAINSCRNUR NONE DETECTED 02/05/2013 0749   LABBENZ NONE DETECTED 02/05/2013 0749   AMPHETMU NONE DETECTED 02/05/2013 0749   THCU POSITIVE* 02/05/2013 0749   LABBARB NONE DETECTED 02/05/2013 0749    Imaging results:  Dg Chest 2 View  09/16/2014   CLINICAL DATA:  Cough, hemoptysis, history of hypertension  and pneumonia  EXAM: CHEST - 2 VIEW  COMPARISON:  04/12/2014  FINDINGS: New patchy airspace opacities in the right middle lobe an anterior segment right upper lobe. Left lung clear. Heart size upper limits normal. No effusion. Visualized skeletal structures are unremarkable.  IMPRESSION: 1. New patchy anterior right upper lobe and right middle lobe airspace infiltrates suggesting pneumonia.   Electronically Signed   By: Arne Cleveland M.D.   On: 09/16/2014 10:59    Other results: EKG: normal EKG, normal sinus rhythm, prolonged QT interval.  Assessment & Plan by Problem: Active Problems:   CAP (community acquired pneumonia)   Community acquired pneumonia  CAP: Patient presents with a one day history of productive cough which felt like her previous episodes of pneumonia. On presentation to the ED, patient was afebrile, mildly hypertensive at 151/71, RR 18 and 83% on room air. This improved to 97% on 2 L via [place]. Labs on admission showed leukocytosis of 12.8 and hypokalemia at 3.0. CXR showed a new patchy anterior RUL and RML suggestive of pneumonia but without effusion. On physical exam, patient had crackles extending up to right mid lung. She was started on Levaquin in the ED and given a Duoneb treatment.  -Azithromycin 500 mg daily -Ceftriaxone 1 gram daily -NS 125 mL/hr IV for 12 hours -Duoneb Q4H prn -Repeat CBC/BMET tomorrow am -Cardiac diet -Droplet isolation -HIV antibody -Cardiac monitoring -Influenza panel  -Supplemental oxygen  -Urinalysis -Blood cultures pending  Hypokalemia: Potassium 3.0 on admission.   -KDur 40 mEq once -Repeat BMET tomorrow am  HTN: BP mildly elevated on admission at 151/71. Patient is on hydralazine 50 mg TID, HCTZ 12.5 mg daily, and Metoprolol 25 mg BID at home. BP improved to 112/64. -Continue home metoprolol 25 mg BID -Hold HCTZ and Hydralazine for now and restart as necessary  H/o Atrial Flutter: Patient has a history of atrial flutter for which she takes metoprolol 25 mg BID and Xarelto 20 mg daily at home. EKG shows patient is currently in sinus rhythm.  -Telemetry, consider d/c tomorrow -Metoprolol 25 mg BID -Xarelto 20 mg daily  DVT/PE ppx: Xarelto 20 mg daily  Dispo: Disposition is deferred at this time, awaiting improvement of current medical problems. Anticipated discharge in approximately 1-2 day(s).   The patient does have a current PCP (Tresa Garter, MD) and does not need an [facility] hospital follow-up appointment after discharge.  The patient does not have transportation limitations that hinder transportation to clinic appointments.  Signed: [name], DO PGY-1 Internal Medicine Resident Pager # [phone] 09/16/2014 3:19 PM

## 2014-09-16 NOTE — ED Provider Notes (Signed)
CSN: [id number]     Arrival date & time 09/16/14  3295 History   First MD Initiated Contact with Patient 09/16/14 1002     Chief Complaint  Patient presents with  . Cough     (Consider location/radiation/quality/duration/timing/severity/associated sxs/prior Treatment) The history is provided by the patient.  MYHA ARIZPE is a 49 y.o. female hx of afib on xarelto, TIA, HTN, COPD here with shortness of breath, cough. She woke up this morning with productive cough and shortness of breath. Denies fevers. She was diagnosed with pneumonia 2 years with similar symptoms so she was concerned. Denies any chest pain. She was a former smoker. Was noted to be hypoxic 83% on RA on arrival.   Past Medical History  Diagnosis Date  . Paroxysmal a-fib   . Shortness of breath   . Hypertension   . Transient ischemic attack (TIA) 01/2013  . Encounter for long-term (current) use of other medications   . Heart murmur   . Pneumonia 02/2011; 08/2011; 03/22/2014   Past Surgical History  Procedure Laterality Date  . Tee without cardioversion N/A 02/18/2013    Procedure: TRANSESOPHAGEAL ECHOCARDIOGRAM (TEE);  Surgeon: Sanda Klein, MD;  Location: [place];  Service: Cardiovascular;  Laterality: N/A;  . Cardioversion N/A 02/18/2013    Procedure: CARDIOVERSION;  Surgeon: Sanda Klein, MD;  Location: [facility] END[facility];  Service: Cardiovascular;  Laterality: N/A;  . Cardioversion N/A 02/23/2013    Procedure: CARDIOVERSION;  Surgeon: Leonie Man, MD;  Location: [place];  Service: Cardiovascular;  Laterality: N/A;  BESIDE CV  . Tubal ligation  1992   Family History  Problem Relation Age of Onset  . Hypertension    . Diabetes    . Cancer Mother   . Hypertension Father   . Atrial fibrillation Father   . Peripheral vascular disease Father    History  Substance Use Topics  . Smoking status: Former Smoker -- 3 years    Types: Cigars    Quit date: 03/15/2013  . Smokeless tobacco: Never Used  . Alcohol Use: 3.6  oz/week    6 Cans of beer per week   OB History    No data available     Review of Systems  Respiratory: Positive for cough and shortness of breath.   All other systems reviewed and are negative.     Allergies  Penicillins  Home Medications   Prior to Admission medications   Medication Sig Start Date End Date Taking? Authorizing Provider  hydrALAZINE (APRESOLINE) 50 MG tablet Take 1 tablet (50 mg total) by mouth 3 (three) times daily. 04/13/14   Tresa Garter, MD  hydrochlorothiazide (MICROZIDE) 12.5 MG capsule Take 1 capsule (12.5 mg total) by mouth daily. 04/13/14   Tresa Garter, MD  metoprolol tartrate (LOPRESSOR) 25 MG tablet Take 1 tablet (25 mg total) by mouth 2 (two) times daily. 06/14/14   Lorayne Marek, MD  rivaroxaban (XARELTO) 20 MG TABS tablet Take 1 tablet (20 mg total) by mouth daily with supper. 04/18/14   Tresa Garter, MD   BP 138/72 mmHg  Pulse 57  Temp(Src) 97.9 F (36.6 C) (Oral)  Resp 29  SpO2 92% Physical Exam  Constitutional: She is oriented to person, place, and time.  tachypneic   HENT:  Head: Normocephalic.  Mouth/Throat: Oropharynx is clear and moist.  Eyes: Conjunctivae and EOM are normal. Pupils are equal, round, and reactive to light.  Neck: Normal range of motion. Neck supple.  Cardiovascular: Normal rate, regular rhythm  and normal heart sounds.   Pulmonary/Chest:  Mild diffuse wheezing and rhonchi   Abdominal: Soft. Bowel sounds are normal. She exhibits no distension. There is no tenderness. There is no rebound.  Musculoskeletal: Normal range of motion. She exhibits no edema or tenderness.  Neurological: She is alert and oriented to person, place, and time. No cranial nerve deficit. Coordination normal.  Skin: Skin is warm and dry.  Psychiatric: She has a normal mood and affect. Her behavior is normal. Judgment and thought content normal.  Nursing note and vitals reviewed.   ED Course  Procedures (including critical  care time) Labs Review Labs Reviewed  CBC WITH DIFFERENTIAL - Abnormal; Notable for the following:    WBC 12.8 (*)    Neutrophils Relative % 80 (*)    Neutro Abs 10.3 (*)    All other components within normal limits  COMPREHENSIVE METABOLIC PANEL - Abnormal; Notable for the following:    Potassium 3.0 (*)    Glucose, Bld 133 (*)    Albumin 3.2 (*)    GFR calc non Af Amer 72 (*)    GFR calc Af Amer 84 (*)    All other components within normal limits  BRAIN NATRIURETIC PEPTIDE - Abnormal; Notable for the following:    B Natriuretic Peptide 178.2 (*)    All other components within normal limits  CULTURE, BLOOD (ROUTINE X 2)  CULTURE, BLOOD (ROUTINE X 2)  PROTIME-INR  I-STAT TROPOININ, ED  I-STAT CG4 LACTIC ACID, ED    Imaging Review Dg Chest 2 View  09/16/2014   CLINICAL DATA:  Cough, hemoptysis, history of hypertension and pneumonia  EXAM: CHEST - 2 VIEW  COMPARISON:  04/12/2014  FINDINGS: New patchy airspace opacities in the right middle lobe an anterior segment right upper lobe. Left lung clear. Heart size upper limits normal. No effusion. Visualized skeletal structures are unremarkable.  IMPRESSION: 1. New patchy anterior right upper lobe and right middle lobe airspace infiltrates suggesting pneumonia.   Electronically Signed   By: Arne Cleveland M.D.   On: 09/16/2014 10:59     EKG Interpretation   Date/Time:  Saturday September 16 2014 10:02:49 EST Ventricular Rate:  60 PR Interval:  146 QRS Duration: 83 QT Interval:  533 QTC Calculation: 533 R Axis:   -24 Text Interpretation:  Sinus rhythm Biatrial enlargement Borderline left  axis deviation Repol abnrm suggests ischemia, anterolateral Prolonged QT  interval previous EKG showed rapid afib  Confirmed by YAO  MD, DAVID  ([id number]) on 09/16/2014 10:05:31 AM      MDM   Final diagnoses:  Cough   LELER BRION is a 49 y.o. female here with SOB, cough, hypoxia. She is on xarelto so I doubt PE. Consider CHF vs pneumonia vs  COPD. Will get labs, CXR. Will give nebs and reassess.   12:19 PM CXR showed multi focal pneumonia. Given levaquin. Cultures sent. Will admit.   Wandra Arthurs, MD 09/16/14 [phone]

## 2014-09-16 NOTE — ED Notes (Signed)
Patient transported to X-ray 

## 2014-09-16 NOTE — ED Notes (Signed)
Patient returned from X-ray 

## 2014-09-16 NOTE — Progress Notes (Signed)
ANTIBIOTIC CONSULT NOTE - INITIAL  Pharmacy Consult for ceftriaxone Indication: pneumonia  Allergies  Allergen Reactions  . Penicillins Other (See Comments)    unknown    Patient Measurements: Height: 5\' 3"  (160 cm) Weight: 192 lb (87.091 kg) IBW/kg (Calculated) : 52.4  Vital Signs: Temp: 98.2 F (36.8 C) (01/02 1401) Temp Source: Oral (01/02 1401) BP: 112/64 mmHg (01/02 1401) Pulse Rate: 57 (01/02 1401) Intake/Output from previous day:   Intake/Output from this shift:    Labs:  Recent Labs  09/16/14 1025  WBC 12.8*  HGB 12.7  PLT 208  CREATININE 0.91   Estimated Creatinine Clearance: 77.4 mL/min (by C-G formula based on Cr of 0.91). No results for input(s): VANCOTROUGH, VANCOPEAK, VANCORANDOM, GENTTROUGH, GENTPEAK, GENTRANDOM, TOBRATROUGH, TOBRAPEAK, TOBRARND, AMIKACINPEAK, AMIKACINTROU, AMIKACIN in the last 72 hours.   Microbiology: No results found for this or any previous visit (from the past 720 hour(s)).  Medical History: Past Medical History  Diagnosis Date  . Paroxysmal a-fib   . Shortness of breath   . Hypertension   . Transient ischemic attack (TIA) 01/2013  . Encounter for long-term (current) use of other medications   . Heart murmur   . Pneumonia 02/2011; 08/2011; 03/22/2014    Medications:  Scheduled:  . sodium chloride   Intravenous STAT  . [START ON 09/17/2014] azithromycin  500 mg Intravenous Q24H  . cefTRIAXone (ROCEPHIN)  IV  1 g Intravenous Q24H  . metoprolol tartrate  25 mg Oral BID  . rivaroxaban  20 mg Oral Q supper  . sodium chloride  3 mL Intravenous Q12H   Assessment: 49 yo f admitted on 1/2 for productive cough, SOB and hypoxia. CXR showed multifocal pneumonia.  Pharmacy is consulted to begin ceftriaxone for CAP. Patient is already receiving azithromycin 500 mg IV q24h. Wbc 12.8, afebrile, SCr 0.91, CrCl ~77 ml/min.   Goal of Therapy:  Eradication of infection  Plan:  Ceftriaxone 1 gm IV q24h Monitor CBC, temperature curve,  C&S, clinical course  Pharmacy will sign off since there are no dose adjustments for renal dysfunction with ceftriaxone.  Please re-consult if needed. Thank you for allowing [place] to be a part of this patient's care.  Lavaughn Bisig L. Nicole Kindred, PharmD Clinical Pharmacy Resident Pager: [phone] 09/16/2014 2:46 PM

## 2014-09-17 LAB — CBC
HEMATOCRIT: 36.1 % (ref 36.0–46.0)
HEMOGLOBIN: 11.3 g/dL — AB (ref 12.0–15.0)
MCH: 26.1 pg (ref 26.0–34.0)
MCHC: 31.3 g/dL (ref 30.0–36.0)
MCV: 83.4 fL (ref 78.0–100.0)
Platelets: 205 10*3/uL (ref 150–400)
RBC: 4.33 MIL/uL (ref 3.87–5.11)
RDW: 13.9 % (ref 11.5–15.5)
WBC: 11.5 10*3/uL — ABNORMAL HIGH (ref 4.0–10.5)

## 2014-09-17 LAB — BASIC METABOLIC PANEL
Anion gap: 5 (ref 5–15)
BUN: 10 mg/dL (ref 6–23)
CALCIUM: 8.6 mg/dL (ref 8.4–10.5)
CO2: 27 mmol/L (ref 19–32)
Chloride: 106 mEq/L (ref 96–112)
Creatinine, Ser: 0.97 mg/dL (ref 0.50–1.10)
GFR calc Af Amer: 78 mL/min — ABNORMAL LOW (ref >90)
GFR, EST NON AFRICAN AMERICAN: 67 mL/min — AB (ref >90)
Glucose, Bld: 105 mg/dL — ABNORMAL HIGH (ref 70–99)
Potassium: 4 mmol/L (ref 3.5–5.1)
Sodium: 138 mmol/L (ref 135–145)

## 2014-09-17 LAB — HIV ANTIBODY (ROUTINE TESTING W REFLEX): HIV 1&2 Ab, 4th Generation: NONREACTIVE

## 2014-09-17 MED ORDER — DOXYCYCLINE HYCLATE 100 MG PO TABS
100.0000 mg | ORAL_TABLET | Freq: Two times a day (BID) | ORAL | Status: DC
  Administered 2014-09-17: 100 mg via ORAL
  Filled 2014-09-17 (×2): qty 1

## 2014-09-17 MED ORDER — DOXYCYCLINE HYCLATE 100 MG PO TABS: 100.0000 mg | ORAL_TABLET | Freq: Two times a day (BID) | ORAL | Status: DC

## 2014-09-17 NOTE — Progress Notes (Signed)
Patient discharge teaching given, including activity, diet, follow-up appoints, and medications. Patient verbalized understanding of all discharge instructions. IV access was d/c'd. Vitals are stable. Skin is intact except as charted in most recent assessments. Pt to be escorted out by NT, to be driven home by family. 

## 2014-09-17 NOTE — Discharge Instructions (Signed)
Thank you for allowing [place] to be involved in your healthcare while you were hospitalized at [facility].   Please note that there have been changes to your home medications.  --> PLEASE LOOK AT YOUR DISCHARGE MEDICATION LIST FOR DETAILS.  Please call your PCP if you have any questions or concerns, or any difficulty getting any of your medications.  Please return to the ER if you have worsening of your symptoms or new severe symptoms arise.  Take Doxycycline 100 mg twice a day for 5 more days.   Pneumonia Pneumonia is an infection of the lungs.  CAUSES Pneumonia may be caused by bacteria or a virus. Usually, these infections are caused by breathing infectious particles into the lungs (respiratory tract). SIGNS AND SYMPTOMS   Cough.  Fever.  Chest pain.  Increased rate of breathing.  Wheezing.  Mucus production. DIAGNOSIS  If you have the common symptoms of pneumonia, your health care provider will typically confirm the diagnosis with a chest X-ray. The X-ray will show an abnormality in the lung (pulmonary infiltrate) if you have pneumonia. Other tests of your blood, urine, or sputum may be done to find the specific cause of your pneumonia. Your health care provider may also do tests (blood gases or pulse oximetry) to see how well your lungs are working. TREATMENT  Some forms of pneumonia may be spread to other people when you cough or sneeze. You may be asked to wear a mask before and during your exam. Pneumonia that is caused by bacteria is treated with antibiotic medicine. Pneumonia that is caused by the influenza virus may be treated with an antiviral medicine. Most other viral infections must run their course. These infections will not respond to antibiotics.  HOME CARE INSTRUCTIONS   Cough suppressants may be used if you are losing too much rest. However, coughing protects you by clearing your lungs. You should avoid using cough suppressants if you can.  Your  health care provider may have prescribed medicine if he or she thinks your pneumonia is caused by bacteria or influenza. Finish your medicine even if you start to feel better.  Your health care provider may also prescribe an expectorant. This loosens the mucus to be coughed up.  Take medicines only as directed by your health care provider.  Do not smoke. Smoking is a common cause of bronchitis and can contribute to pneumonia. If you are a smoker and continue to smoke, your cough may last several weeks after your pneumonia has cleared.  A cold steam vaporizer or humidifier in your room or home may help loosen mucus.  Coughing is often worse at night. Sleeping in a semi-upright position in a recliner or using a couple pillows under your head will help with this.  Get rest as you feel it is needed. Your body will usually let you know when you need to rest. PREVENTION A pneumococcal shot (vaccine) is available to prevent a common bacterial cause of pneumonia. This is usually suggested for:  People over 18 years old.  Patients on chemotherapy.  People with chronic lung problems, such as bronchitis or emphysema.  People with immune system problems. If you are over 65 or have a high risk condition, you may receive the pneumococcal vaccine if you have not received it before. In some countries, a routine influenza vaccine is also recommended. This vaccine can help prevent some cases of pneumonia.You may be offered the influenza vaccine as part of your care. If you  smoke, it is time to quit. You may receive instructions on how to stop smoking. Your health care provider can provide medicines and counseling to help you quit. SEEK MEDICAL CARE IF: You have a fever. SEEK IMMEDIATE MEDICAL CARE IF:   Your illness becomes worse. This is especially true if you are elderly or weakened from any other disease.  You cannot control your cough with suppressants and are losing sleep.  You begin coughing up  blood.  You develop pain which is getting worse or is uncontrolled with medicines.  Any of the symptoms which initially brought you in for treatment are getting worse rather than better.  You develop shortness of breath or chest pain. MAKE SURE YOU:   Understand these instructions.  Will watch your condition.  Will get help right away if you are not doing well or get worse. Document Released: 09/01/2005 Document Revised: 01/16/2014 Document Reviewed: 11/21/2010 [facility] Patient Information 2015 [place], [place]. This information is not intended to replace advice given to you by your health care provider. Make sure you discuss any questions you have with your health care provider.

## 2014-09-17 NOTE — Progress Notes (Signed)
Subjective:  Patient was seen and examined this morning. Patient denies any complaints. She feels well, denies shortness of breath, denies cough and states "rattling in chest" has gone away. Denies any fever or chills. Patient has been on 2 L oxygen continuously.   Objective: Vital signs in last 24 hours: Filed Vitals:   09/16/14 2114 09/16/14 2243 09/17/14 0517 09/17/14 0929  BP: 135/64 140/66 132/81 140/80  Pulse: 60 68 55 58  Temp: 98.2 F (36.8 C)  98.3 F (36.8 C)   TempSrc: Oral  Oral   Resp: 18  18   Height:      Weight:      SpO2: 98%  100%    Weight change:   Intake/Output Summary (Last 24 hours) at 09/17/14 1217 Last data filed at 09/17/14 0726  Gross per 24 hour  Intake    972 ml  Output   1100 ml  Net   -128 ml   General: Vital signs reviewed. Patient is well-developed and well-nourished, in no acute distress and cooperative with exam.  Cardiovascular: Bradycardic, regular rhythm, S1 normal, S2 normal, no murmurs, gallops, or rubs. Pulmonary/Chest: CTA b/l, no wheezing rhonchi or rales. Abdominal: Soft, non-tender, non-distended, BS + Extremities: No lower extremity edema bilaterally Neurological: A&O x3 Skin: Warm, dry and intact. No rashes or erythema. Psychiatric: Normal mood and affect. speech and behavior is normal. Cognition and memory are normal.   Lab Results: Basic Metabolic Panel:  Recent Labs Lab 09/16/14 1025 09/17/14 0420  NA 141 138  K 3.0* 4.0  CL 105 106  CO2 26 27  GLUCOSE 133* 105*  BUN 10 10  CREATININE 0.91 0.97  CALCIUM 8.9 8.6   Liver Function Tests:  Recent Labs Lab 09/16/14 1025  AST 22  ALT 15  ALKPHOS 69  BILITOT 0.3  PROT 7.0  ALBUMIN 3.2*   CBC:  Recent Labs Lab 09/16/14 1025 09/17/14 0420  WBC 12.8* 11.5*  NEUTROABS 10.3*  --   HGB 12.7 11.3*  HCT 39.5 36.1  MCV 84.4 83.4  PLT 208 205   Coagulation:  Recent Labs Lab 09/16/14 1025  LABPROT 13.6  INR 1.03   Urine Drug Screen: Drugs of  Abuse     Component Value Date/Time   LABOPIA NONE DETECTED 02/05/2013 0749   COCAINSCRNUR NONE DETECTED 02/05/2013 0749   LABBENZ NONE DETECTED 02/05/2013 0749   AMPHETMU NONE DETECTED 02/05/2013 0749   THCU POSITIVE* 02/05/2013 0749   LABBARB NONE DETECTED 02/05/2013 0749    Urinalysis:  Recent Labs Lab 09/16/14 1501  COLORURINE YELLOW  LABSPEC 1.015  PHURINE 7.5  GLUCOSEU NEGATIVE  HGBUR TRACE*  BILIRUBINUR NEGATIVE  KETONESUR NEGATIVE  PROTEINUR NEGATIVE  UROBILINOGEN 0.2  NITRITE NEGATIVE  LEUKOCYTESUR SMALL*   Studies/Results: Dg Chest 2 View  09/16/2014   CLINICAL DATA:  Cough, hemoptysis, history of hypertension and pneumonia  EXAM: CHEST - 2 VIEW  COMPARISON:  04/12/2014  FINDINGS: New patchy airspace opacities in the right middle lobe an anterior segment right upper lobe. Left lung clear. Heart size upper limits normal. No effusion. Visualized skeletal structures are unremarkable.  IMPRESSION: 1. New patchy anterior right upper lobe and right middle lobe airspace infiltrates suggesting pneumonia.   Electronically Signed   By: Arne Cleveland M.D.   On: 09/16/2014 10:59   Medications:  I have reviewed the patient's current medications. Prior to Admission:  Prescriptions prior to admission  Medication Sig Dispense Refill Last Dose  . hydrALAZINE (APRESOLINE) 50 MG tablet  Take 1 tablet (50 mg total) by mouth 3 (three) times daily. 180 tablet 3 09/16/2014 at Unknown time  . hydrochlorothiazide (MICROZIDE) 12.5 MG capsule Take 1 capsule (12.5 mg total) by mouth daily. 90 capsule 3 09/16/2014 at Unknown time  . metoprolol tartrate (LOPRESSOR) 25 MG tablet Take 1 tablet (25 mg total) by mouth 2 (two) times daily. 60 tablet 2 09/16/2014 at 0700  . rivaroxaban (XARELTO) 20 MG TABS tablet Take 1 tablet (20 mg total) by mouth daily with supper. 90 tablet 3 09/15/2014 at Unknown time   Scheduled Meds: . doxycycline  100 mg Oral Q12H  . metoprolol tartrate  25 mg Oral BID  .  rivaroxaban  20 mg Oral Q supper  . sodium chloride  3 mL Intravenous Q12H   Continuous Infusions:  PRN Meds:.ipratropium-albuterol Assessment/Plan: Active Problems:   CAP (community acquired pneumonia)   Community acquired pneumonia   Paroxysmal atrial flutter  CAP: CXR showed a new patchy anterior RUL and RML suggestive of pneumonia but without effusion. Influenza negative. UA negative for infection. Patient did well overnight and was 100% 2 L oxygen this morning. Patient denies any complaints. We will transition from azithromycin and ceftriaxone to doxycyline po.  -Discontinue Azithromycin and Ceftriaxone  -Doxycycline 100 mg BID -Duoneb Q4H prn -Cardiac diet -HIV antibody -Cardiac monitoring -Supplemental oxygen  -Ambulate with pulse oximetry -Blood cultures pending -Likely discharge to home today  Hypokalemia: Potassium 3.0 on admission. Improved to 4.0 this morning.  -Resolved  HTN: BP 132/81 this morning. Patient is on hydralazine 50 mg TID, HCTZ 12.5 mg daily, and Metoprolol 25 mg BID at home.  -Continue home metoprolol 25 mg BID -Hold HCTZ and Hydralazine for now and restart as necessary  H/o Atrial Flutter: Patient has a history of atrial flutter for which she takes metoprolol 25 mg BID and Xarelto 20 mg daily at home. EKG shows patient is currently in sinus rhythm.  -D/C telemetry -Metoprolol 25 mg BID -Xarelto 20 mg daily  DVT/PE ppx: Xarelto 20 mg daily  Dispo: Disposition is deferred at this time, awaiting improvement of current medical problems.  Anticipated discharge in approximately today.   The patient does have a current PCP (Tresa Garter, MD) and does not need an [facility] hospital follow-up appointment after discharge.  The patient does not have transportation limitations that hinder transportation to clinic appointments.  .Services Needed at time of discharge: Y = Yes, Blank = No PT:   OT:   RN:   Equipment:   Other:     LOS: 1 day   [name], DO PGY-1 Internal Medicine Resident Pager # [phone] 09/17/2014 12:17 PM

## 2014-09-17 NOTE — Discharge Summary (Signed)
Name: [name] MRN: [id number] DOB: 1966/04/23 49 y.o. PCP: Tresa Garter, MD  Date of Admission: 09/16/2014  9:53 AM Date of Discharge: 09/17/2014 Attending Physician: Sid Falcon, MD  Discharge Diagnosis:  Principal Problem:   CAP (community acquired pneumonia) Active Problems:   Hypokalemia   HTN (hypertension)   Paroxysmal atrial flutter  Discharge Medications:   Medication List    TAKE these medications        doxycycline 100 MG tablet  Commonly known as:  VIBRA-TABS  Take 1 tablet (100 mg total) by mouth every 12 (twelve) hours.     hydrALAZINE 50 MG tablet  Commonly known as:  APRESOLINE  Take 1 tablet (50 mg total) by mouth 3 (three) times daily.     hydrochlorothiazide 12.5 MG capsule  Commonly known as:  MICROZIDE  Take 1 capsule (12.5 mg total) by mouth daily.     metoprolol tartrate 25 MG tablet  Commonly known as:  LOPRESSOR  Take 1 tablet (25 mg total) by mouth 2 (two) times daily.     rivaroxaban 20 MG Tabs tablet  Commonly known as:  XARELTO  Take 1 tablet (20 mg total) by mouth daily with supper.        Disposition and follow-up:   ElizabethElizabeth Murphy was discharged from [facility] in Good condition.  At the hospital follow up visit please address:  1.  CAP: Please assess resolution of community acquired pneumonia and completion of doxycycline.   2.  Labs / imaging needed at time of follow-up: None  3.  Pending labs/ test needing follow-up: Blood cultures, HIV antibody  Follow-up Appointments: Follow-up Information    Follow up with Angelica Chessman, MD.   Specialty:  Internal Medicine   Why:  as hospital follow up in 1-2 weeks   Contact information:   [address] [id number] [phone]       Discharge Instructions: Discharge Instructions    Diet - low sodium heart healthy    Complete by:  As directed      Increase activity slowly    Complete by:  As directed             Consultations:  None  Procedures Performed:  Dg Chest 2 View  09/16/2014   CLINICAL DATA:  Cough, hemoptysis, history of hypertension and pneumonia  EXAM: CHEST - 2 VIEW  COMPARISON:  04/12/2014  FINDINGS: New patchy airspace opacities in the right middle lobe an anterior segment right upper lobe. Left lung clear. Heart size upper limits normal. No effusion. Visualized skeletal structures are unremarkable.  IMPRESSION: 1. New patchy anterior right upper lobe and right middle lobe airspace infiltrates suggesting pneumonia.   Electronically Signed   By: Arne Cleveland M.D.   On: 09/16/2014 10:59   Admission HPI: [name] is a 49 yo female with PMHx of COPD, TIA, HTN and h/o aflutter on Xarelto who presented to the ED with complaint of shortness of breath and productive cough. Patient states that she went to bed this morning around 1 am and awoke with productive cough yellow and tinged with blood and a "rattling" in her chest. She states she knew she had pneumonia because she has had these exact symptoms several times before. Instead of trying any medications or waiting, she decided to come to the ED. She denies any symptoms of fever, chills, chest pain, shortness of breath, sinus congestion, sinus pressure, sore throat, nausea, vomiting or dizziness.  Patient denies any history of COPD. She is a previous tobacco use- only 1 pp 3 days for 2 years at the most. She quit 3 years ago. 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 She did not have the flu shot.   Hospital Course by problem list: Principal Problem:   CAP (community acquired pneumonia) Active Problems:   Hypokalemia   HTN (hypertension)   Paroxysmal atrial flutter   CAP: Patient presented with a one day history of productive cough which felt like her previous episodes of pneumonia. On presentation to the ED, patient was afebrile, mildly hypertensive at 151/71, RR 18 and 83% on room air. This improved to 97% on 2 L  via [place]. Labs on admission showed leukocytosis of 12.8 and hypokalemia at 3.0. CXR showed a new patchy anterior RUL and RML suggestive of pneumonia but without effusion. On physical exam, patient had crackles extending up to right mid lung. She was started on Levaquin in the ED and given a Duoneb treatment. Influenza negative. UA negative for infection. Patient did well overnight and was 100% 2 L oxygen this morning. Patient was ambulated on room air and never dropped below 96%. Patient denied any complaints and lungs were clear to ausculation bilaterally. We transitioned her from azithromycin and ceftriaxone to doxycyline 100 mg BID.  Hypokalemia: Potassium was 3.0 on admission, which improved to 4.0 after Kdur 40 mEq once.   HTN: BP was mildly elevated on admission at 151/71. Patient is on hydralazine 50 mg TID, HCTZ 12.5 mg daily, and Metoprolol 25 mg BID at home. We continued home metoprolol 25 mg BID during admission and restarted all medications on discharge.   H/o Atrial Flutter: Patient has a history of atrial flutter for which she takes metoprolol 25 mg BID and Xarelto 20 mg daily at home. Patient remained in sinus rhythm during hospital stay. We continued metoprolol and xarelto during admission and on discharge.   Discharge Vitals:   BP 123/51 mmHg  Pulse 53  Temp(Src) 98.3 F (36.8 C) (Oral)  Resp 18  Ht 5\' 3"  (1.6 m)  Wt 87.091 kg (192 lb)  BMI 34.02 kg/m2  SpO2 100%  Discharge Labs:  Results for orders placed or performed during the hospital encounter of 09/16/14 (from the past 24 hour(s))  Influenza panel by PCR (type A & B, H1N1)     Status: None   Collection Time: 09/16/14  3:44 PM  Result Value Ref Range   Influenza A By PCR NEGATIVE NEGATIVE   Influenza B By PCR NEGATIVE NEGATIVE   H1N1 flu by pcr NOT DETECTED NOT DETECTED  Basic metabolic panel     Status: Abnormal   Collection Time: 09/17/14  4:20 AM  Result Value Ref Range   Sodium 138 135 - 145 mmol/L   Potassium  4.0 3.5 - 5.1 mmol/L   Chloride 106 96 - 112 mEq/L   CO2 27 19 - 32 mmol/L   Glucose, Bld 105 (H) 70 - 99 mg/dL   BUN 10 6 - 23 mg/dL   Creatinine, Ser 0.97 0.50 - 1.10 mg/dL   Calcium 8.6 8.4 - 10.5 mg/dL   GFR calc non Af Amer 67 (L) >90 mL/min   GFR calc Af Amer 78 (L) >90 mL/min   Anion gap 5 5 - 15  CBC     Status: Abnormal   Collection Time: 09/17/14  4:20 AM  Result Value Ref Range   WBC 11.5 (H) 4.0 - 10.5  K/uL   RBC 4.33 3.87 - 5.11 MIL/uL   Hemoglobin 11.3 (L) 12.0 - 15.0 g/dL   HCT 36.1 36.0 - 46.0 %   MCV 83.4 78.0 - 100.0 fL   MCH 26.1 26.0 - 34.0 pg   MCHC 31.3 30.0 - 36.0 g/dL   RDW 13.9 11.5 - 15.5 %   Platelets 205 150 - 400 K/uL    Signed: [name], DO PGY-1 Internal Medicine Resident Pager # [phone] 09/17/2014 3:43 PM

## 2014-09-22 LAB — CULTURE, BLOOD (ROUTINE X 2)
CULTURE: NO GROWTH
Culture: NO GROWTH

## 2014-09-25 ENCOUNTER — Ambulatory Visit: Payer: Self-pay | Admitting: Internal Medicine

## 2014-10-05 ENCOUNTER — Ambulatory Visit: Payer: Self-pay | Admitting: Internal Medicine

## 2014-12-04 ENCOUNTER — Other Ambulatory Visit: Payer: Self-pay | Admitting: Internal Medicine

## 2015-03-22 ENCOUNTER — Encounter: Payer: Self-pay | Admitting: Internal Medicine

## 2015-03-22 ENCOUNTER — Ambulatory Visit: Payer: Self-pay | Attending: Internal Medicine | Admitting: Internal Medicine

## 2015-03-22 VITALS — BP 136/81 | HR 50 | Temp 98.1°F | Resp 20 | Ht 63.0 in | Wt 192.4 lb

## 2015-03-22 DIAGNOSIS — I4892 Unspecified atrial flutter: Secondary | ICD-10-CM

## 2015-03-22 DIAGNOSIS — Z0000 Encounter for general adult medical examination without abnormal findings: Secondary | ICD-10-CM

## 2015-03-22 DIAGNOSIS — Z1211 Encounter for screening for malignant neoplasm of colon: Secondary | ICD-10-CM

## 2015-03-22 DIAGNOSIS — I10 Essential (primary) hypertension: Secondary | ICD-10-CM

## 2015-03-22 DIAGNOSIS — I739 Peripheral vascular disease, unspecified: Secondary | ICD-10-CM | POA: Insufficient documentation

## 2015-03-22 LAB — COMPLETE METABOLIC PANEL WITH GFR
ALT: 33 U/L (ref 0–35)
AST: 35 U/L (ref 0–37)
Albumin: 4 g/dL (ref 3.5–5.2)
Alkaline Phosphatase: 82 U/L (ref 39–117)
BUN: 13 mg/dL (ref 6–23)
CHLORIDE: 102 meq/L (ref 96–112)
CO2: 28 mEq/L (ref 19–32)
Calcium: 9.7 mg/dL (ref 8.4–10.5)
Creat: 0.87 mg/dL (ref 0.50–1.10)
GFR, EST NON AFRICAN AMERICAN: 78 mL/min
GFR, Est African American: >89 mL/min
Glucose, Bld: 85 mg/dL (ref 70–99)
Potassium: 4.3 mEq/L (ref 3.5–5.3)
SODIUM: 141 meq/L (ref 135–145)
Total Bilirubin: 0.5 mg/dL (ref 0.2–1.2)
Total Protein: 7.4 g/dL (ref 6.0–8.3)

## 2015-03-22 LAB — LIPID PANEL
Cholesterol: 147 mg/dL (ref 0–200)
HDL: 40 mg/dL — ABNORMAL LOW (ref >=46)
LDL CALC: 92 mg/dL (ref 0–99)
Total CHOL/HDL Ratio: 3.7 Ratio
Triglycerides: 75 mg/dL (ref <150)
VLDL: 15 mg/dL (ref 0–40)

## 2015-03-22 LAB — POCT GLYCOSYLATED HEMOGLOBIN (HGB A1C): Hemoglobin A1C: 5.9

## 2015-03-22 LAB — TSH: TSH: 1.938 u[IU]/mL (ref 0.350–4.500)

## 2015-03-22 MED ORDER — RIVAROXABAN 20 MG PO TABS
20.0000 mg | ORAL_TABLET | Freq: Every day | ORAL | Status: DC
Start: 2015-03-22 — End: 2015-11-06

## 2015-03-22 MED ORDER — METOPROLOL TARTRATE 25 MG PO TABS: 25.0000 mg | ORAL_TABLET | Freq: Two times a day (BID) | ORAL | Status: DC

## 2015-03-22 MED ORDER — HYDROCHLOROTHIAZIDE 12.5 MG PO CAPS: 12.5000 mg | ORAL_CAPSULE | Freq: Every day | ORAL | Status: DC

## 2015-03-22 MED ORDER — HYDRALAZINE HCL 50 MG PO TABS: 50.0000 mg | ORAL_TABLET | Freq: Three times a day (TID) | ORAL | Status: DC

## 2015-03-22 NOTE — Progress Notes (Signed)
Patient ID: [name], female   DOB: [birth year], 49 y.o.   MRN: [id number]   Telissa Palmisano, is a 49 y.o. female  FXT:[id number]  GDJ:[id number]  DOB - July 14, 1966  Chief Complaint  Patient presents with  . Annual Exam        Subjective:   [name] is a 49 y.o. female with a history of paroxysmal atrial fibrillation, hypertension and heart murmur here today for a follow up visit. Patient reports she has been experiencing bad cramps in her right lower leg. Cramps have been going on for about 3 months. Patient reports the pain is cramping, tingling, and numbing feeling. Pain comes and goes. Patient reports that she has been taking some leg cramps pills that was given to her since Sunday and that has helped. Patient reports dry spots on her hands and feet. Patient has No headache, No chest pain, No abdominal pain - No Nausea, No new weakness tingling or numbness, No Cough - SOB.  Problem  Claudication of Both Lower Extremities    ALLERGIES: Allergies  Allergen Reactions  . Penicillins Hives and Other (See Comments)    unknown    PAST MEDICAL HISTORY: Past Medical History  Diagnosis Date  . Paroxysmal a-fib   . Shortness of breath   . Hypertension   . Transient ischemic attack (TIA) 01/2013  . Encounter for long-term (current) use of other medications   . Heart murmur   . Pneumonia 02/2011; 08/2011; 03/22/2014    MEDICATIONS AT HOME: Prior to Admission medications   Medication Sig Start Date End Date Taking? Authorizing Provider  hydrALAZINE (APRESOLINE) 50 MG tablet Take 1 tablet (50 mg total) by mouth 3 (three) times daily. 03/22/15  Yes Tresa Garter, MD  hydrochlorothiazide (MICROZIDE) 12.5 MG capsule Take 1 capsule (12.5 mg total) by mouth daily. 03/22/15  Yes Tresa Garter, MD  metoprolol tartrate (LOPRESSOR) 25 MG tablet Take 1 tablet (25 mg total) by mouth 2 (two) times daily. 03/22/15  Yes Tresa Garter, MD  rivaroxaban (XARELTO) 20 MG TABS tablet Take 1  tablet (20 mg total) by mouth daily with supper. 03/22/15  Yes Tresa Garter, MD  doxycycline (VIBRA-TABS) 100 MG tablet Take 1 tablet (100 mg total) by mouth every 12 (twelve) hours. Patient not taking: Reported on 03/22/2015 09/17/14   Alexa Sherral Hammers, MD     Objective:   Filed Vitals:   03/22/15 1219 03/22/15 1220  BP:  136/81  Pulse:  50  Temp:  98.1 F (36.7 C)  TempSrc:  Oral  Resp:  20  Height: 5\' 3"  (1.6 m)   Weight: 192 lb 6.4 oz (87.272 kg)   SpO2:  99%    Exam General appearance : Awake, alert, not in any distress. Speech Clear. Not toxic looking HEENT: Atraumatic and Normocephalic, pupils equally reactive to light and accomodation Neck: supple, no JVD. No cervical lymphadenopathy.  Chest:Good air entry bilaterally, no added sounds  CVS: S1 S2 regular, no murmurs.  Abdomen: Bowel sounds present, Non tender and not distended with no gaurding, rigidity or rebound. Extremities: B/L Lower Ext shows no edema, both legs are warm to touch Neurology: Awake alert, and oriented X 3, CN II-XII intact, Non focal Skin:No Rash  Data Review Lab Results  Component Value Date   HGBA1C 5.7* 02/06/2013   HGBA1C 5.7* 02/05/2013   HGBA1C 6.6* 03/07/2011     Assessment & Plan   1. Atrial flutter, unspecified  Continue - rivaroxaban (XARELTO) 20 MG  TABS tablet; Take 1 tablet (20 mg total) by mouth daily with supper.  Dispense: 90 tablet; Refill: 3  2. Hypertension, essential, benign - hydrochlorothiazide (MICROZIDE) 12.5 MG capsule; Take 1 capsule (12.5 mg total) by mouth daily.  Dispense: 90 capsule; Refill: 3 - hydrALAZINE (APRESOLINE) 50 MG tablet; Take 1 tablet (50 mg total) by mouth 3 (three) times daily.  Dispense: 180 tablet; Refill: 3 - Ambulatory referral to Gastroenterology - COMPLETE METABOLIC PANEL WITH GFR - POCT glycosylated hemoglobin (Hb A1C) - Lipid panel - TSH - Urinalysis, Complete  - We have discussed target BP range and blood pressure goal - I  have advised patient to check BP regularly and to call us back or report to clinic if the numbers are consistently higher than 140/90  - We discussed the importance of compliance with medical therapy and DASH diet recommended, consequences of uncontrolled hypertension discussed.  - continue current BP medications  3. Claudication of both lower extremities  - ABI; Future  4. Preventative health care  Mammogram: Up to date, last one 10/26/2013  5. Colon cancer screening  - Ambulatory referral to Gastroenterology  Patient have been counseled extensively about nutrition and exercise  Return in about 3 months (around 06/22/2015) for Follow up Pain and comorbidities, Follow up HTN, Routine Follow Up.  The patient was given clear instructions to go to ER or return to medical center if symptoms don't improve, worsen or new problems develop. The patient verbalized understanding. The patient was told to call to get lab results if they haven't heard anything in the next week.   This note has been created with Surveyor, quantity. Any transcriptional errors are unintentional.    Angelica Chessman, MD, MHA, [place], [place], [place], [place]   03/22/2015, 1:03 PM

## 2015-03-22 NOTE — Patient Instructions (Signed)
Atrial Fibrillation Atrial fibrillation is a type of irregular heart rhythm (arrhythmia). During atrial fibrillation, the upper chambers of the heart (atria) quiver continuously in a chaotic pattern. This causes an irregular and often rapid heart rate.  Atrial fibrillation is the result of the heart becoming overloaded with disorganized signals that tell it to beat. These signals are normally released one at a time by a part of the right atrium called the sinoatrial node. They then travel from the atria to the lower chambers of the heart (ventricles), causing the atria and ventricles to contract and pump blood as they pass. In atrial fibrillation, parts of the atria outside of the sinoatrial node also release these signals. This results in two problems. First, the atria receive so many signals that they do not have time to fully contract. Second, the ventricles, which can only receive one signal at a time, beat irregularly and out of rhythm with the atria.  There are three types of atrial fibrillation:   Paroxysmal. Paroxysmal atrial fibrillation starts suddenly and stops on its own within a week.  Persistent. Persistent atrial fibrillation lasts for more than a week. It may stop on its own or with treatment.  Permanent. Permanent atrial fibrillation does not go away. Episodes of atrial fibrillation may lead to permanent atrial fibrillation. Atrial fibrillation can prevent your heart from pumping blood normally. It increases your risk of stroke and can lead to heart failure.  CAUSES   Heart conditions, including a heart attack, heart failure, coronary artery disease, and heart valve conditions.   Inflammation of the sac that surrounds the heart (pericarditis).  Blockage of an artery in the lungs (pulmonary embolism).  Pneumonia or other infections.  Chronic lung disease.  Thyroid problems, especially if the thyroid is overactive (hyperthyroidism).  Caffeine, excessive alcohol use, and use  of some illegal drugs.   Use of some medicines, including certain decongestants and diet pills.  Heart surgery.   Birth defects.  Sometimes, no cause can be found. When this happens, the atrial fibrillation is called lone atrial fibrillation. The risk of complications from atrial fibrillation increases if you have lone atrial fibrillation and you are age 89 years or older. RISK FACTORS  Heart failure.  Coronary artery disease.  Diabetes mellitus.   High blood pressure (hypertension).   Obesity.   Other arrhythmias.   Increased age. SIGNS AND SYMPTOMS   A feeling that your heart is beating rapidly or irregularly.   A feeling of discomfort or pain in your chest.   Shortness of breath.   Sudden light-headedness or weakness.   Getting tired easily when exercising.   Urinating more often than normal (mainly when atrial fibrillation first begins).  In paroxysmal atrial fibrillation, symptoms may start and suddenly stop. DIAGNOSIS  Your health care provider may be able to detect atrial fibrillation when taking your pulse. Your health care provider may have you take a test called an ambulatory electrocardiogram (ECG). An ECG records your heartbeat patterns over a 24-hour period. You may also have other tests, such as:  Transthoracic echocardiogram (TTE). During echocardiography, sound waves are used to evaluate how blood flows through your heart.  Transesophageal echocardiogram (TEE).  Stress test. There is more than one type of stress test. If a stress test is needed, ask your health care provider about which type is best for you.  Chest X-ray exam.  Blood tests.  Computed tomography (CT). TREATMENT  Treatment may include:  Treating any underlying conditions. For example, if you  have an overactive thyroid, treating the condition may correct atrial fibrillation.  Taking medicine. Medicines may be given to control a rapid heart rate or to prevent blood  clots, heart failure, or a stroke.  Having a procedure to correct the rhythm of the heart:  Electrical cardioversion. During electrical cardioversion, a controlled, low-energy shock is delivered to the heart through your skin. If you have chest pain, very low blood pressure, or sudden heart failure, this procedure may need to be done as an emergency.  Catheter ablation. During this procedure, heart tissues that send the signals that cause atrial fibrillation are destroyed.  Surgical ablation. During this surgery, thin lines of heart tissue that carry the abnormal signals are destroyed. This procedure can either be an open-heart surgery or a minimally invasive surgery. With the minimally invasive surgery, small cuts are made to access the heart instead of a large opening.  Pulmonary venous isolation. During this surgery, tissue around the veins that carry blood from the lungs (pulmonary veins) is destroyed. This tissue is thought to carry the abnormal signals. HOME CARE INSTRUCTIONS   Take medicines only as directed by your health care provider. Some medicines can make atrial fibrillation worse or recur.  If blood thinners were prescribed by your health care provider, take them exactly as directed. Too much blood-thinning medicine can cause bleeding. If you take too little, you will not have the needed protection against stroke and other problems.  Perform blood tests at home if directed by your health care provider. Perform blood tests exactly as directed.  Quit smoking if you smoke.  Do not drink alcohol.  Do not drink caffeinated beverages such as coffee, soda, and some teas. You may drink decaffeinated coffee, soda, or tea.   Maintain a healthy weight.Do not use diet pills unless your health care provider approves. They may make heart problems worse.   Follow diet instructions as directed by your health care provider.  Exercise regularly as directed by your health care  provider.  Keep all follow-up visits as directed by your health care provider. This is important. PREVENTION  The following substances can cause atrial fibrillation to recur:   Caffeinated beverages.  Alcohol.  Certain medicines, especially those used for breathing problems.  Certain herbs and herbal medicines, such as those containing ephedra or ginseng.  Illegal drugs, such as cocaine and amphetamines. Sometimes medicines are given to prevent atrial fibrillation from recurring. Proper treatment of any underlying condition is also important in helping prevent recurrence.  SEEK MEDICAL CARE IF:  You notice a change in the rate, rhythm, or strength of your heartbeat.  You suddenly begin urinating more frequently.  You tire more easily when exerting yourself or exercising. SEEK IMMEDIATE MEDICAL CARE IF:   You have chest pain, abdominal pain, sweating, or weakness.  You feel nauseous.  You have shortness of breath.  You suddenly have swollen feet and ankles.  You feel dizzy.  Your face or limbs feel numb or weak.  You have a change in your vision or speech. MAKE SURE YOU:   Understand these instructions.  Will watch your condition.  Will get help right away if you are not doing well or get worse. Document Released: 09/01/2005 Document Revised: 01/16/2014 Document Reviewed: 10/12/2012 [facility] Patient Information 2015 [place], [place]. This information is not intended to replace advice given to you by your health care provider. Make sure you discuss any questions you have with your health care provider. DASH Eating Plan DASH stands for "  Dietary Approaches to Stop Hypertension." The DASH eating plan is a healthy eating plan that has been shown to reduce high blood pressure (hypertension). Additional health benefits may include reducing the risk of type 2 diabetes mellitus, heart disease, and stroke. The DASH eating plan may also help with weight loss. WHAT DO I NEED TO  KNOW ABOUT THE DASH EATING PLAN? For the DASH eating plan, you will follow these general guidelines:  Choose foods with a percent daily value for sodium of less than 5% (as listed on the food label).  Use salt-free seasonings or herbs instead of table salt or sea salt.  Check with your health care provider or pharmacist before using salt substitutes.  Eat lower-sodium products, often labeled as "lower sodium" or "no salt added."  Eat fresh foods.  Eat more vegetables, fruits, and low-fat dairy products.  Choose whole grains. Look for the word "whole" as the first word in the ingredient list.  Choose fish and skinless chicken or [place] more often than red meat. Limit fish, poultry, and meat to 6 oz (170 g) each day.  Limit sweets, desserts, sugars, and sugary drinks.  Choose heart-healthy fats.  Limit cheese to 1 oz (28 g) per day.  Eat more home-cooked food and less restaurant, buffet, and fast food.  Limit fried foods.  Nevels foods using methods other than frying.  Limit canned vegetables. If you do use them, rinse them well to decrease the sodium.  When eating at a restaurant, ask that your food be prepared with less salt, or no salt if possible. WHAT FOODS CAN I EAT? Seek help from a dietitian for individual calorie needs. Grains Whole grain or whole wheat bread. Brown rice. Whole grain or whole wheat pasta. Quinoa, bulgur, and whole grain cereals. Low-sodium cereals. Corn or whole wheat flour tortillas. Whole grain cornbread. Whole grain crackers. Low-sodium crackers. Vegetables Fresh or frozen vegetables (raw, steamed, roasted, or grilled). Low-sodium or reduced-sodium tomato and vegetable juices. Low-sodium or reduced-sodium tomato sauce and paste. Low-sodium or reduced-sodium canned vegetables.  Fruits All fresh, canned (in natural juice), or frozen fruits. Meat and Other Protein Products Ground beef (85% or leaner), grass-fed beef, or beef trimmed of fat. Skinless  chicken or [place]. Ground chicken or [place]. Pork trimmed of fat. All fish and seafood. Eggs. Dried beans, peas, or lentils. Unsalted nuts and seeds. Unsalted canned beans. Dairy Low-fat dairy products, such as skim or 1% milk, 2% or reduced-fat cheeses, low-fat ricotta or cottage cheese, or plain low-fat yogurt. Low-sodium or reduced-sodium cheeses. Fats and Oils Tub margarines without trans fats. Light or reduced-fat mayonnaise and salad dressings (reduced sodium). Avocado. Safflower, olive, or canola oils. Natural peanut or almond butter. Other Unsalted popcorn and pretzels. The items listed above may not be a complete list of recommended foods or beverages. Contact your dietitian for more options. WHAT FOODS ARE NOT RECOMMENDED? Grains White bread. White pasta. White rice. Refined cornbread. Bagels and croissants. Crackers that contain trans fat. Vegetables Creamed or fried vegetables. Vegetables in a cheese sauce. Regular canned vegetables. Regular canned tomato sauce and paste. Regular tomato and vegetable juices. Fruits Dried fruits. Canned fruit in light or heavy syrup. Fruit juice. Meat and Other Protein Products Fatty cuts of meat. Ribs, chicken wings, bacon, sausage, bologna, salami, chitterlings, fatback, hot dogs, bratwurst, and packaged luncheon meats. Salted nuts and seeds. Canned beans with salt. Dairy Whole or 2% milk, cream, half-and-half, and cream cheese. Whole-fat or sweetened yogurt. Full-fat cheeses or blue cheese. Nondairy  creamers and whipped toppings. Processed cheese, cheese spreads, or cheese curds. Condiments Onion and garlic salt, seasoned salt, table salt, and sea salt. Canned and packaged gravies. Worcestershire sauce. Tartar sauce. Barbecue sauce. Teriyaki sauce. Soy sauce, including reduced sodium. Steak sauce. Fish sauce. Oyster sauce. Cocktail sauce. Horseradish. Ketchup and mustard. Meat flavorings and tenderizers. Bouillon cubes. Hot sauce. Tabasco sauce.  Marinades. Taco seasonings. Relishes. Fats and Oils Butter, stick margarine, lard, shortening, ghee, and bacon fat. Coconut, palm kernel, or palm oils. Regular salad dressings. Other Pickles and olives. Salted popcorn and pretzels. The items listed above may not be a complete list of foods and beverages to avoid. Contact your dietitian for more information. WHERE CAN I FIND MORE INFORMATION? National Heart, Lung, and Blood Institute: travelstabloid.com Document Released: 08/21/2011 Document Revised: 01/16/2014 Document Reviewed: 07/06/2013 [facility] Patient Information 2015 [place], [place]. This information is not intended to replace advice given to you by your health care provider. Make sure you discuss any questions you have with your health care provider. Hypertension Hypertension, commonly called high blood pressure, is when the force of blood pumping through your arteries is too strong. Your arteries are the blood vessels that carry blood from your heart throughout your body. A blood pressure reading consists of a higher number over a lower number, such as 110/72. The higher number (systolic) is the pressure inside your arteries when your heart pumps. The lower number (diastolic) is the pressure inside your arteries when your heart relaxes. Ideally you want your blood pressure below 120/80. Hypertension forces your heart to work harder to pump blood. Your arteries may become narrow or stiff. Having hypertension puts you at risk for heart disease, stroke, and other problems.  RISK FACTORS Some risk factors for high blood pressure are controllable. Others are not.  Risk factors you cannot control include:   Race. You may be at higher risk if you are African American.  Age. Risk increases with age.  Gender. Men are at higher risk than women before age 28 years. After age 68, women are at higher risk than men. Risk factors you can control include:  Not getting  enough exercise or physical activity.  Being overweight.  Getting too much fat, sugar, calories, or salt in your diet.  Drinking too much alcohol. SIGNS AND SYMPTOMS Hypertension does not usually cause signs or symptoms. Extremely high blood pressure (hypertensive crisis) may cause headache, anxiety, shortness of breath, and nosebleed. DIAGNOSIS  To check if you have hypertension, your health care provider will measure your blood pressure while you are seated, with your arm held at the level of your heart. It should be measured at least twice using the same arm. Certain conditions can cause a difference in blood pressure between your right and left arms. A blood pressure reading that is higher than normal on one occasion does not mean that you need treatment. If one blood pressure reading is high, ask your health care provider about having it checked again. TREATMENT  Treating high blood pressure includes making lifestyle changes and possibly taking medicine. Living a healthy lifestyle can help lower high blood pressure. You may need to change some of your habits. Lifestyle changes may include:  Following the DASH diet. This diet is high in fruits, vegetables, and whole grains. It is low in salt, red meat, and added sugars.  Getting at least 2 hours of brisk physical activity every week.  Losing weight if necessary.  Not smoking.  Limiting alcoholic beverages.  Learning ways  to reduce stress. If lifestyle changes are not enough to get your blood pressure under control, your health care provider may prescribe medicine. You may need to take more than one. Work closely with your health care provider to understand the risks and benefits. HOME CARE INSTRUCTIONS  Have your blood pressure rechecked as directed by your health care provider.   Take medicines only as directed by your health care provider. Follow the directions carefully. Blood pressure medicines must be taken as prescribed.  The medicine does not work as well when you skip doses. Skipping doses also puts you at risk for problems.   Do not smoke.   Monitor your blood pressure at home as directed by your health care provider. SEEK MEDICAL CARE IF:   You think you are having a reaction to medicines taken.  You have recurrent headaches or feel dizzy.  You have swelling in your ankles.  You have trouble with your vision. SEEK IMMEDIATE MEDICAL CARE IF:  You develop a severe headache or confusion.  You have unusual weakness, numbness, or feel faint.  You have severe chest or abdominal pain.  You vomit repeatedly.  You have trouble breathing. MAKE SURE YOU:   Understand these instructions.  Will watch your condition.  Will get help right away if you are not doing well or get worse. Document Released: 09/01/2005 Document Revised: 01/16/2014 Document Reviewed: 06/24/2013 [facility] Patient Information 2015 [place], [place]. This information is not intended to replace advice given to you by your health care provider. Make sure you discuss any questions you have with your health care provider.

## 2015-03-22 NOTE — Progress Notes (Signed)
Patient here for a check up. Patient reports she has been experiencing bad cramps in her right lower leg. Cramps have been going on for about 3 months. Patient reports the pain is cramping, tingling, and numbing feeling. Pain comes and goes. Patient reports that she has been taking some leg cramps pills that was given to her since Sunday and that has helped. Patient reports dry spots on her hands and feet.

## 2015-03-23 ENCOUNTER — Telehealth: Payer: Self-pay | Admitting: *Deleted

## 2015-03-23 LAB — URINALYSIS, COMPLETE
Bacteria, UA: NONE SEEN
Bilirubin Urine: NEGATIVE
CRYSTALS: NONE SEEN
Casts: NONE SEEN
Glucose, UA: NEGATIVE mg/dL
Hgb urine dipstick: NEGATIVE
Ketones, ur: NEGATIVE mg/dL
Leukocytes, UA: NEGATIVE
Nitrite: NEGATIVE
Protein, ur: NEGATIVE mg/dL
SQUAMOUS EPITHELIAL / LPF: NONE SEEN
Specific Gravity, Urine: 1.019 (ref 1.005–1.030)
UROBILINOGEN UA: 1 mg/dL (ref 0.0–1.0)
pH: 6 (ref 5.0–8.0)

## 2015-03-23 NOTE — Telephone Encounter (Signed)
Verified name and date of birth and gave results.  Patient verbalized understanding and had no further questions.

## 2015-03-23 NOTE — Telephone Encounter (Signed)
-----   Message from Tresa Garter, MD sent at 03/23/2015  9:17 AM EDT ----- Please inform patient her laboratory test results are within normal limits.

## 2015-05-11 ENCOUNTER — Encounter: Payer: Self-pay | Admitting: Internal Medicine

## 2015-08-13 ENCOUNTER — Other Ambulatory Visit: Payer: Self-pay | Admitting: Internal Medicine

## 2015-10-05 ENCOUNTER — Emergency Department (HOSPITAL_COMMUNITY): Payer: Self-pay

## 2015-10-05 ENCOUNTER — Emergency Department (HOSPITAL_COMMUNITY)
Admission: EM | Admit: 2015-10-05 | Discharge: 2015-10-05 | Disposition: A | Discharge status: Home / Self Care | Payer: Self-pay | Attending: Emergency Medicine | Admitting: Emergency Medicine

## 2015-10-05 ENCOUNTER — Encounter (HOSPITAL_COMMUNITY): Payer: Self-pay | Admitting: *Deleted

## 2015-10-05 DIAGNOSIS — Z87891 Personal history of nicotine dependence: Secondary | ICD-10-CM | POA: Insufficient documentation

## 2015-10-05 DIAGNOSIS — J208 Acute bronchitis due to other specified organisms: Secondary | ICD-10-CM

## 2015-10-05 DIAGNOSIS — R011 Cardiac murmur, unspecified: Secondary | ICD-10-CM | POA: Insufficient documentation

## 2015-10-05 DIAGNOSIS — Z8701 Personal history of pneumonia (recurrent): Secondary | ICD-10-CM | POA: Insufficient documentation

## 2015-10-05 DIAGNOSIS — R059 Cough, unspecified: Secondary | ICD-10-CM

## 2015-10-05 DIAGNOSIS — J40 Bronchitis, not specified as acute or chronic: Secondary | ICD-10-CM | POA: Insufficient documentation

## 2015-10-05 DIAGNOSIS — Z880 Allergy status to penicillin: Secondary | ICD-10-CM | POA: Insufficient documentation

## 2015-10-05 DIAGNOSIS — R05 Cough: Secondary | ICD-10-CM

## 2015-10-05 DIAGNOSIS — Z79899 Other long term (current) drug therapy: Secondary | ICD-10-CM | POA: Insufficient documentation

## 2015-10-05 DIAGNOSIS — I10 Essential (primary) hypertension: Secondary | ICD-10-CM | POA: Insufficient documentation

## 2015-10-05 NOTE — ED Provider Notes (Signed)
CSN: [id number]     Arrival date & time 10/05/15  1455 History   First MD Initiated Contact with Patient 10/05/15 1844     Chief Complaint  Patient presents with  . Cough   (Consider location/radiation/quality/duration/timing/severity/associated sxs/prior Treatment) HPI 50 y.o. female with a hx of pneumonia in the past, presents to the Emergency Department today complaining of cough for the past 2 weeks. 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 Reports clear sputum when she is coughing. No fevers noted. No sinus pressure, sore throat, ear pain. No N/V, Reports one episode of Diarrhea yesterday. No blood noted. No chest pain/abd pain. She is able to tolerate PO intake.   Past Medical History  Diagnosis Date  . Paroxysmal a-fib ([place])   . Shortness of breath   . Hypertension   . Transient ischemic attack (TIA) 01/2013  . Encounter for long-term (current) use of other medications   . Heart murmur   . Pneumonia 02/2011; 08/2011; 03/22/2014   Past Surgical History  Procedure Laterality Date  . Tee without cardioversion N/A 02/18/2013    Procedure: TRANSESOPHAGEAL ECHOCARDIOGRAM (TEE);  Surgeon: Sanda Klein, MD;  Location: [facility];  Service: Cardiovascular;  Laterality: N/A;  . Cardioversion N/A 02/18/2013    Procedure: CARDIOVERSION;  Surgeon: Sanda Klein, MD;  Location: [facility] ENDOSCOPY;  Service: Cardiovascular;  Laterality: N/A;  . Cardioversion N/A 02/23/2013    Procedure: CARDIOVERSION;  Surgeon: Leonie Man, MD;  Location: [place];  Service: Cardiovascular;  Laterality: N/A;  BESIDE CV  . Tubal ligation  1992   Family History  Problem Relation Age of Onset  . Hypertension    . Diabetes    . Cancer Mother   . Hypertension Father   . Atrial fibrillation Father   . Peripheral vascular disease Father    Social History  Substance Use Topics  . Smoking status: Former Smoker -- 3 years    Types: Cigars    Quit date: 03/15/2013  . Smokeless tobacco: Never Used  .  Alcohol Use: 3.6 oz/week    6 Cans of beer per week     Comment: only on weekends   OB History    No data available     Review of Systems 10 Systems reviewed and all are negative for acute change except as noted in the HPI.  Allergies  Penicillins  Home Medications   Prior to Admission medications   Medication Sig Start Date End Date Taking? Authorizing Provider  doxycycline (VIBRA-TABS) 100 MG tablet Take 1 tablet (100 mg total) by mouth every 12 (twelve) hours. Patient not taking: Reported on 03/22/2015 09/17/14   Alexa Sherral Hammers, MD  hydrALAZINE (APRESOLINE) 50 MG tablet Take 1 tablet (50 mg total) by mouth 3 (three) times daily. 03/22/15   Tresa Garter, MD  hydrochlorothiazide (MICROZIDE) 12.5 MG capsule Take 1 capsule (12.5 mg total) by mouth daily. 03/22/15   Tresa Garter, MD  metoprolol tartrate (LOPRESSOR) 25 MG tablet Take 1 tablet (25 mg total) by mouth 2 (two) times daily. 03/22/15   Tresa Garter, MD  rivaroxaban (XARELTO) 20 MG TABS tablet Take 1 tablet (20 mg total) by mouth daily with supper. 03/22/15   Tresa Garter, MD   BP 127/102 mmHg  Pulse 100  Temp(Src) 97.9 F (36.6 C) (Oral)  Resp 18  SpO2 99%   Physical Exam  Constitutional: She is oriented to person, place, and time. She appears well-developed and well-nourished.  HENT:  Head:  Normocephalic and atraumatic.  Right Ear: Tympanic membrane, external ear and ear canal normal.  Left Ear: Tympanic membrane, external ear and ear canal normal.  Nose: Nose normal.  Mouth/Throat: Uvula is midline, oropharynx is clear and moist and mucous membranes are normal.  Eyes: EOM are normal.  Neck: Normal range of motion. Neck supple.  Cardiovascular: Normal rate, regular rhythm and normal heart sounds.   Pulmonary/Chest: Effort normal and breath sounds normal.  Abdominal: Soft. Normal appearance and bowel sounds are normal.  Musculoskeletal: Normal range of motion.  Neurological: She is alert and  oriented to person, place, and time.  Skin: Skin is warm and dry.  Psychiatric: She has a normal mood and affect. Her behavior is normal. Thought content normal.  Nursing note and vitals reviewed.  ED Course  Procedures (including critical care time) Labs Review Labs Reviewed - No data to display  Imaging Review Dg Chest 2 View  10/05/2015  CLINICAL DATA:  Persistent dry cough for 2 weeks. EXAM: CHEST  2 VIEW COMPARISON:  09/16/2014 FINDINGS: The cardiac silhouette is enlarged. Mediastinal contours appear intact. There is no evidence of focal airspace consolidation, pleural effusion or pneumothorax. There is pulmonary vascular congestion. Osseous structures are without acute abnormality. Soft tissues are grossly normal. IMPRESSION: Enlarged cardiac silhouette with pulmonary vascular congestion. No evidence of focal airspace consolidation. Electronically Signed   By: Fidela Salisbury M.D.   On: 10/05/2015 16:00   I have personally reviewed and evaluated these images and lab results as part of my medical decision-making.   EKG Interpretation None      MDM  I have reviewed relevant imaging studies. I have reviewed the relevant previous healthcare records.I obtained HPI from historian.  ED Course:  Assessment: 68y F with hx pneumonia presents with cough for the past 2 weeks. Pt Afebrile. Patient with likely viral bronchitis. No concern for PNA given normal lung exam/x-ray. Antibiotics not indicated. Conservative therapy indicated. Patient is in no acute distress. Vital Signs are stable. Patient is able to ambulate. Patient able to tolerate PO.     Disposition/Plan:  DC Home Additional Verbal discharge instructions given and discussed with patient.  Pt Instructed to f/u with PCP in the next 48 hours for evaluation and treatment of symptoms. Return precautions given Pt acknowledges and agrees with plan  Supervising Physician Wandra Arthurs, MD   Final diagnoses:  Cough  Viral  bronchitis      Shary Decamp, PA-C 10/05/15 1914  Wandra Arthurs, MD 10/05/15 2258

## 2015-10-05 NOTE — Discharge Instructions (Signed)
Please read and follow all provided instructions.  Your diagnoses today include:  1. Viral bronchitis   2. Cough    Tests performed today include:  Vital signs. See below for your results today.   Medications prescribed:   None  Home care instructions:  Follow any educational materials contained in this packet.  Follow-up instructions: Please follow-up with your primary care provider in the next 48 hours for further evaluation of symptoms and treatment   Return instructions:   Please return to the Emergency Department if you do not get better, if you get worse, or new symptoms OR  - Fever (temperature greater than 101.34F)  - Bleeding that does not stop with holding pressure to the area    -Severe pain (please note that you may be more sore the day after your accident)  - Chest Pain  - Difficulty breathing  - Severe nausea or vomiting  - Inability to tolerate food and liquids  - Passing out  - Skin becoming red around your wounds  - Change in mental status (confusion or lethargy)  - New numbness or weakness     Please return if you have any other emergent concerns.  Additional Information:  Your vital signs today were: BP 127/102 mmHg   Pulse 100   Temp(Src) 97.9 F (36.6 C) (Oral)   Resp 18   SpO2 99% If your blood pressure (BP) was elevated above 135/85 this visit, please have this repeated by your doctor within one month. ---------------

## 2015-10-05 NOTE — ED Notes (Signed)
Pt reports having a productive cough x 2 weeks, clear to white sputum. Denies fever. No acute distress noted at triage.

## 2015-10-23 MED FILL — hydrALAZINE HCL 50 MG TABS: 50 | 30 days supply | Qty: 90 | Fill #1

## 2015-10-25 MED FILL — ?METOPROLOL 25 MG TABLET: 25 | 30 days supply | Qty: 60 | Fill #1

## 2015-10-25 MED FILL — ?HYDROCHLOROTHIAZIDE 12.5MG: 12.5 | 30 days supply | Qty: 30 | Fill #1

## 2015-11-02 ENCOUNTER — Encounter (HOSPITAL_COMMUNITY)
Admission: EM | Disposition: A | Discharge status: Home / Self Care | Payer: Self-pay | Source: Home / Self Care | Attending: Neurology

## 2015-11-02 ENCOUNTER — Inpatient Hospital Stay (HOSPITAL_COMMUNITY): Payer: MEDICAID

## 2015-11-02 ENCOUNTER — Inpatient Hospital Stay (HOSPITAL_COMMUNITY)
Admission: EM | Admit: 2015-11-02 | Discharge: 2015-11-06 | DRG: 062 | Disposition: A | Discharge status: Home / Self Care | Payer: Self-pay | Attending: Neurology | Admitting: Neurology

## 2015-11-02 ENCOUNTER — Inpatient Hospital Stay (HOSPITAL_COMMUNITY): Payer: Self-pay

## 2015-11-02 ENCOUNTER — Encounter (HOSPITAL_COMMUNITY): Payer: Self-pay | Admitting: Emergency Medicine

## 2015-11-02 ENCOUNTER — Encounter (HOSPITAL_COMMUNITY): Payer: Self-pay | Admitting: Critical Care Medicine

## 2015-11-02 ENCOUNTER — Emergency Department (HOSPITAL_COMMUNITY): Payer: Self-pay

## 2015-11-02 DIAGNOSIS — Z91199 Patient's noncompliance with other medical treatment and regimen due to unspecified reason: Secondary | ICD-10-CM

## 2015-11-02 DIAGNOSIS — Z87891 Personal history of nicotine dependence: Secondary | ICD-10-CM

## 2015-11-02 DIAGNOSIS — I639 Cerebral infarction, unspecified: Secondary | ICD-10-CM

## 2015-11-02 DIAGNOSIS — I63411 Cerebral infarction due to embolism of right middle cerebral artery: Principal | ICD-10-CM | POA: Diagnosis present

## 2015-11-02 DIAGNOSIS — I63511 Cerebral infarction due to unspecified occlusion or stenosis of right middle cerebral artery: Secondary | ICD-10-CM

## 2015-11-02 DIAGNOSIS — Z9114 Patient's other noncompliance with medication regimen: Secondary | ICD-10-CM

## 2015-11-02 DIAGNOSIS — I472 Ventricular tachycardia: Secondary | ICD-10-CM | POA: Diagnosis present

## 2015-11-02 DIAGNOSIS — E876 Hypokalemia: Secondary | ICD-10-CM | POA: Diagnosis present

## 2015-11-02 DIAGNOSIS — I4891 Unspecified atrial fibrillation: Secondary | ICD-10-CM | POA: Diagnosis present

## 2015-11-02 DIAGNOSIS — Z7901 Long term (current) use of anticoagulants: Secondary | ICD-10-CM

## 2015-11-02 DIAGNOSIS — Z833 Family history of diabetes mellitus: Secondary | ICD-10-CM

## 2015-11-02 DIAGNOSIS — I6601 Occlusion and stenosis of right middle cerebral artery: Secondary | ICD-10-CM

## 2015-11-02 DIAGNOSIS — R29810 Facial weakness: Secondary | ICD-10-CM | POA: Diagnosis present

## 2015-11-02 DIAGNOSIS — E669 Obesity, unspecified: Secondary | ICD-10-CM | POA: Diagnosis present

## 2015-11-02 DIAGNOSIS — I481 Persistent atrial fibrillation: Secondary | ICD-10-CM | POA: Diagnosis present

## 2015-11-02 DIAGNOSIS — G8194 Hemiplegia, unspecified affecting left nondominant side: Secondary | ICD-10-CM | POA: Diagnosis present

## 2015-11-02 DIAGNOSIS — I10 Essential (primary) hypertension: Secondary | ICD-10-CM | POA: Diagnosis present

## 2015-11-02 DIAGNOSIS — Z6833 Body mass index (BMI) 33.0-33.9, adult: Secondary | ICD-10-CM

## 2015-11-02 DIAGNOSIS — I959 Hypotension, unspecified: Secondary | ICD-10-CM | POA: Diagnosis present

## 2015-11-02 DIAGNOSIS — Z9119 Patient's noncompliance with other medical treatment and regimen: Secondary | ICD-10-CM

## 2015-11-02 DIAGNOSIS — E785 Hyperlipidemia, unspecified: Secondary | ICD-10-CM

## 2015-11-02 DIAGNOSIS — I4729 Other ventricular tachycardia: Secondary | ICD-10-CM

## 2015-11-02 DIAGNOSIS — I4892 Unspecified atrial flutter: Secondary | ICD-10-CM

## 2015-11-02 DIAGNOSIS — Z809 Family history of malignant neoplasm, unspecified: Secondary | ICD-10-CM

## 2015-11-02 DIAGNOSIS — D164 Benign neoplasm of bones of skull and face: Secondary | ICD-10-CM

## 2015-11-02 DIAGNOSIS — Z8673 Personal history of transient ischemic attack (TIA), and cerebral infarction without residual deficits: Secondary | ICD-10-CM | POA: Insufficient documentation

## 2015-11-02 DIAGNOSIS — J449 Chronic obstructive pulmonary disease, unspecified: Secondary | ICD-10-CM | POA: Diagnosis present

## 2015-11-02 DIAGNOSIS — I119 Hypertensive heart disease without heart failure: Secondary | ICD-10-CM | POA: Diagnosis present

## 2015-11-02 DIAGNOSIS — D165 Benign neoplasm of lower jaw bone: Secondary | ICD-10-CM

## 2015-11-02 DIAGNOSIS — F812 Mathematics disorder: Secondary | ICD-10-CM

## 2015-11-02 DIAGNOSIS — Z8249 Family history of ischemic heart disease and other diseases of the circulatory system: Secondary | ICD-10-CM

## 2015-11-02 DIAGNOSIS — F17210 Nicotine dependence, cigarettes, uncomplicated: Secondary | ICD-10-CM | POA: Diagnosis present

## 2015-11-02 LAB — COMPREHENSIVE METABOLIC PANEL
ALBUMIN: 3.4 g/dL — AB (ref 3.5–5.0)
ALT: 54 U/L (ref 14–54)
ANION GAP: 12 (ref 5–15)
AST: 47 U/L — ABNORMAL HIGH (ref 15–41)
Alkaline Phosphatase: 85 U/L (ref 38–126)
BILIRUBIN TOTAL: 1.1 mg/dL (ref 0.3–1.2)
BUN: 12 mg/dL (ref 6–20)
CO2: 23 mmol/L (ref 22–32)
Calcium: 9.2 mg/dL (ref 8.9–10.3)
Chloride: 106 mmol/L (ref 101–111)
Creatinine, Ser: 0.99 mg/dL (ref 0.44–1.00)
Glucose, Bld: 123 mg/dL — ABNORMAL HIGH (ref 65–99)
POTASSIUM: 3.3 mmol/L — AB (ref 3.5–5.1)
Sodium: 141 mmol/L (ref 135–145)
TOTAL PROTEIN: 7.3 g/dL (ref 6.5–8.1)

## 2015-11-02 LAB — DIFFERENTIAL
Basophils Absolute: 0 10*3/uL (ref 0.0–0.1)
Basophils Relative: 0 %
EOS ABS: 0.1 10*3/uL (ref 0.0–0.7)
EOS PCT: 1 %
LYMPHS ABS: 2.3 10*3/uL (ref 0.7–4.0)
Lymphocytes Relative: 24 %
Monocytes Absolute: 0.4 10*3/uL (ref 0.1–1.0)
Monocytes Relative: 4 %
NEUTROS PCT: 71 %
Neutro Abs: 6.9 10*3/uL (ref 1.7–7.7)

## 2015-11-02 LAB — I-STAT CHEM 8, ED
BUN: 15 mg/dL (ref 6–20)
CREATININE: 0.9 mg/dL (ref 0.44–1.00)
Calcium, Ion: 1.04 mmol/L — ABNORMAL LOW (ref 1.12–1.23)
Chloride: 102 mmol/L (ref 101–111)
Glucose, Bld: 120 mg/dL — ABNORMAL HIGH (ref 65–99)
HEMATOCRIT: 46 % (ref 36.0–46.0)
HEMOGLOBIN: 15.6 g/dL — AB (ref 12.0–15.0)
Potassium: 3.4 mmol/L — ABNORMAL LOW (ref 3.5–5.1)
Sodium: 141 mmol/L (ref 135–145)
TCO2: 26 mmol/L (ref 0–100)

## 2015-11-02 LAB — PROTIME-INR
INR: 1.23 (ref 0.00–1.49)
Prothrombin Time: 15.6 seconds — ABNORMAL HIGH (ref 11.6–15.2)

## 2015-11-02 LAB — CBC
HCT: 41.3 % (ref 36.0–46.0)
HEMOGLOBIN: 13 g/dL (ref 12.0–15.0)
MCH: 26.1 pg (ref 26.0–34.0)
MCHC: 31.5 g/dL (ref 30.0–36.0)
MCV: 82.9 fL (ref 78.0–100.0)
Platelets: 225 10*3/uL (ref 150–400)
RBC: 4.98 MIL/uL (ref 3.87–5.11)
RDW: 15.1 % (ref 11.5–15.5)
WBC: 9.7 10*3/uL (ref 4.0–10.5)

## 2015-11-02 LAB — URINE MICROSCOPIC-ADD ON

## 2015-11-02 LAB — I-STAT TROPONIN, ED: TROPONIN I, POC: 0.01 ng/mL (ref 0.00–0.08)

## 2015-11-02 LAB — RAPID URINE DRUG SCREEN, HOSP PERFORMED
Amphetamines: NOT DETECTED
Barbiturates: NOT DETECTED
Benzodiazepines: NOT DETECTED
COCAINE: NOT DETECTED
OPIATES: NOT DETECTED
Tetrahydrocannabinol: NOT DETECTED

## 2015-11-02 LAB — MRSA PCR SCREENING: MRSA by PCR: NEGATIVE

## 2015-11-02 LAB — URINALYSIS, ROUTINE W REFLEX MICROSCOPIC
Bilirubin Urine: NEGATIVE
GLUCOSE, UA: NEGATIVE mg/dL
Hgb urine dipstick: NEGATIVE
Ketones, ur: NEGATIVE mg/dL
Nitrite: NEGATIVE
PH: 6 (ref 5.0–8.0)
Protein, ur: NEGATIVE mg/dL
Specific Gravity, Urine: 1.016 (ref 1.005–1.030)

## 2015-11-02 LAB — GLUCOSE, CAPILLARY
GLUCOSE-CAPILLARY: 182 mg/dL — AB (ref 65–99)
GLUCOSE-CAPILLARY: 181 mg/dL — AB (ref 65–99)

## 2015-11-02 LAB — ETHANOL: Alcohol, Ethyl (B): <5 mg/dL (ref <5)

## 2015-11-02 LAB — APTT: aPTT: 26 seconds (ref 24–37)

## 2015-11-02 SURGERY — RADIOLOGY WITH ANESTHESIA
Anesthesia: Choice

## 2015-11-02 MED ORDER — DILTIAZEM LOAD VIA INFUSION: 10.0000 mg | Freq: Once | INTRAVENOUS | Status: DC

## 2015-11-02 MED ORDER — ALTEPLASE (STROKE) FULL DOSE INFUSION: 0.9000 mg/kg | Freq: Once | INTRAVENOUS | Status: DC

## 2015-11-02 MED ORDER — DILTIAZEM HCL 100 MG IV SOLR
5.0000 mg/h | INTRAVENOUS | Status: DC
  Administered 2015-11-02: 5 mg/h via INTRAVENOUS
  Administered 2015-11-03 – 2015-11-04 (×4): 15 mg/h via INTRAVENOUS
  Filled 2015-11-02 (×6): qty 100

## 2015-11-02 MED ORDER — CETYLPYRIDINIUM CHLORIDE 0.05 % MT LIQD
7.0000 mL | Freq: Two times a day (BID) | OROMUCOSAL | Status: DC
  Administered 2015-11-02 – 2015-11-04 (×4): 7 mL via OROMUCOSAL

## 2015-11-02 MED ORDER — STROKE: EARLY STAGES OF RECOVERY BOOK
Freq: Once | Status: DC
  Filled 2015-11-02 (×2): qty 1

## 2015-11-02 MED ORDER — ALTEPLASE (STROKE) FULL DOSE INFUSION
0.9000 mg/kg | Freq: Once | INTRAVENOUS | Status: DC
  Filled 2015-11-02: qty 100

## 2015-11-02 MED ORDER — ACETAMINOPHEN 325 MG PO TABS
650.0000 mg | ORAL_TABLET | ORAL | Status: DC | PRN
  Administered 2015-11-03 – 2015-11-06 (×4): 650 mg via ORAL
  Filled 2015-11-02 (×4): qty 2

## 2015-11-02 MED ORDER — DILTIAZEM HCL 100 MG IV SOLR: 5.0000 mg/h | INTRAVENOUS | Status: DC

## 2015-11-02 MED ORDER — DIPHENHYDRAMINE HCL 50 MG/ML IJ SOLN
12.5000 mg | Freq: Once | INTRAMUSCULAR | Status: AC
  Administered 2015-11-02: 12.5 mg via INTRAVENOUS
  Filled 2015-11-02: qty 1

## 2015-11-02 MED ORDER — ALTEPLASE (STROKE) FULL DOSE INFUSION
0.9000 mg/kg | Freq: Once | INTRAVENOUS | Status: AC
  Administered 2015-11-02: 77 mg via INTRAVENOUS
  Filled 2015-11-02: qty 100

## 2015-11-02 MED ORDER — AMIODARONE HCL IN DEXTROSE 360-4.14 MG/200ML-% IV SOLN: 30.0000 mg/h | INTRAVENOUS | Status: DC

## 2015-11-02 MED ORDER — ALTEPLASE 100 MG IV SOLR: 8.0000 mg | Freq: Once | INTRAVENOUS | Status: DC

## 2015-11-02 MED ORDER — AMIODARONE HCL IN DEXTROSE 360-4.14 MG/200ML-% IV SOLN
60.0000 mg/h | INTRAVENOUS | Status: DC
  Administered 2015-11-02: 60 mg/h via INTRAVENOUS
  Filled 2015-11-02 (×2): qty 200

## 2015-11-02 MED ORDER — ALTEPLASE 100 MG IV SOLR
8.0000 mg | Freq: Once | INTRAVENOUS | Status: DC
  Filled 2015-11-02: qty 8

## 2015-11-02 MED ORDER — DILTIAZEM HCL 100 MG IV SOLR
5.0000 mg/h | Freq: Once | INTRAVENOUS | Status: AC
  Administered 2015-11-02: 5 mg/h via INTRAVENOUS
  Filled 2015-11-02: qty 100

## 2015-11-02 MED ORDER — SODIUM CHLORIDE 0.9 % IV SOLN
INTRAVENOUS | Status: DC
  Administered 2015-11-02: 18:00:00 via INTRAVENOUS
  Administered 2015-11-04: 1000 mL via INTRAVENOUS
  Administered 2015-11-05: 15:00:00 via INTRAVENOUS

## 2015-11-02 MED ORDER — DILTIAZEM HCL 25 MG/5ML IV SOLN
10.0000 mg | Freq: Once | INTRAVENOUS | Status: AC
  Administered 2015-11-02: 10 mg via INTRAVENOUS

## 2015-11-02 MED ORDER — LABETALOL HCL 5 MG/ML IV SOLN
10.0000 mg | INTRAVENOUS | Status: DC | PRN
Start: 2015-11-02 — End: 2015-11-06

## 2015-11-02 MED ORDER — METHYLPREDNISOLONE SODIUM SUCC 125 MG IJ SOLR
125.0000 mg | Freq: Once | INTRAMUSCULAR | Status: AC
  Administered 2015-11-02: 125 mg via INTRAVENOUS
  Filled 2015-11-02: qty 2

## 2015-11-02 MED ORDER — SENNOSIDES-DOCUSATE SODIUM 8.6-50 MG PO TABS: 1.0000 | ORAL_TABLET | Freq: Every evening | ORAL | Status: DC | PRN

## 2015-11-02 MED ORDER — ACETAMINOPHEN 650 MG RE SUPP: 650.0000 mg | RECTAL | Status: DC | PRN

## 2015-11-02 MED ORDER — AMIODARONE LOAD VIA INFUSION
150.0000 mg | Freq: Once | INTRAVENOUS | Status: AC
  Administered 2015-11-02: 150 mg via INTRAVENOUS
  Filled 2015-11-02: qty 83.34

## 2015-11-02 MED ORDER — SODIUM CHLORIDE 0.9 % IV BOLUS (SEPSIS)
1000.0000 mL | Freq: Once | INTRAVENOUS | Status: AC
  Administered 2015-11-02: 1000 mL via INTRAVENOUS

## 2015-11-02 MED ORDER — PANTOPRAZOLE SODIUM 40 MG IV SOLR
40.0000 mg | Freq: Every day | INTRAVENOUS | Status: DC
  Administered 2015-11-02: 40 mg via INTRAVENOUS
  Filled 2015-11-02: qty 40

## 2015-11-02 MED ORDER — SODIUM CHLORIDE 0.9 % IV SOLN
50.0000 mL | Freq: Once | INTRAVENOUS | Status: DC
Start: 2015-11-02 — End: 2015-11-02

## 2015-11-02 MED ORDER — DIPHENHYDRAMINE HCL 25 MG PO CAPS
25.0000 mg | ORAL_CAPSULE | Freq: Once | ORAL | Status: AC
  Administered 2015-11-02: 25 mg via ORAL
  Filled 2015-11-02: qty 1

## 2015-11-02 NOTE — H&P (Signed)
History and physical   Chief Complaint: Code stroke  HPI:                                                                                                                                         [name] is an 50 y.o. female who was eating with friends when she was noted to suddenly have a left facial droop, drool and lean to the left around 1030. EMS was called and code stroke was called. On arrival she has a right gaze deviation, left facial droop and left sided plegia. She is on Xeralto but states she has not taken this for 6 months.  BP was low with systolic at 80. Bolus of fluid given. tPA was given at 11:40.      Past Medical History  Diagnosis Date  . Paroxysmal a-fib ([place])   . Shortness of breath   . Hypertension   . Transient ischemic attack (TIA) 01/2013  . Encounter for long-term (current) use of other medications   . Heart murmur   . Pneumonia 02/2011; 08/2011; 03/22/2014    Past Surgical History  Procedure Laterality Date  . Tee without cardioversion N/A 02/18/2013    Procedure: TRANSESOPHAGEAL ECHOCARDIOGRAM (TEE);  Surgeon: Sanda Klein, MD;  Location: [facility];  Service: Cardiovascular;  Laterality: N/A;  . Cardioversion N/A 02/18/2013    Procedure: CARDIOVERSION;  Surgeon: Sanda Klein, MD;  Location: [facility];  Service: Cardiovascular;  Laterality: N/A;  . Cardioversion N/A 02/23/2013    Procedure: CARDIOVERSION;  Surgeon: Leonie Man, MD;  Location: [facility];  Service: Cardiovascular;  Laterality: N/A;  BESIDE CV  . Tubal ligation  1992    Family History  Problem Relation Age of Onset  . Hypertension    . Diabetes    . Cancer Mother   . Hypertension Father   . Atrial fibrillation Father   . Peripheral vascular disease Father    Social History:  reports that she quit smoking about 2 years ago. Her smoking use included Cigars. She has never used smokeless tobacco. She reports that she drinks about 3.6 oz of alcohol per week. She reports that  she does not use illicit drugs.  Allergies:  Allergies  Allergen Reactions  . Penicillins Hives and Other (See Comments)    unknown    Medications:  No current facility-administered medications for this encounter.   Current Outpatient Prescriptions  Medication Sig Dispense Refill  . hydrALAZINE (APRESOLINE) 50 MG tablet Take 1 tablet (50 mg total) by mouth 3 (three) times daily. 180 tablet 3  . hydrochlorothiazide (MICROZIDE) 12.5 MG capsule Take 1 capsule (12.5 mg total) by mouth daily. 90 capsule 3  . metoprolol tartrate (LOPRESSOR) 25 MG tablet Take 1 tablet (25 mg total) by mouth 2 (two) times daily. 180 tablet 3  . rivaroxaban (XARELTO) 20 MG TABS tablet Take 1 tablet (20 mg total) by mouth daily with supper. 90 tablet 3     ROS:                                                                                                                                       History obtained from the patient  General ROS: negative for - chills, fatigue, fever, night sweats, weight gain or weight loss Psychological ROS: negative for - behavioral disorder, hallucinations, memory difficulties, mood swings or suicidal ideation Ophthalmic ROS: negative for - blurry vision, double vision, eye pain or loss of vision ENT ROS: negative for - epistaxis, nasal discharge, oral lesions, sore throat, tinnitus or vertigo Allergy and Immunology ROS: negative for - hives or itchy/watery eyes Hematological and Lymphatic ROS: negative for - bleeding problems, bruising or swollen lymph nodes Endocrine ROS: negative for - galactorrhea, hair pattern changes, polydipsia/polyuria or temperature intolerance Respiratory ROS: negative for - cough, hemoptysis, shortness of breath or wheezing Cardiovascular ROS: negative for - chest pain, dyspnea on exertion, edema or irregular  heartbeat Gastrointestinal ROS: negative for - abdominal pain, diarrhea, hematemesis, nausea/vomiting or stool incontinence Genito-Urinary ROS: negative for - dysuria, hematuria, incontinence or urinary frequency/urgency Musculoskeletal ROS: negative for - joint swelling or muscular weakness Neurological ROS: as noted in HPI Dermatological ROS: negative for rash and skin lesion changes  Neurologic Examination:                                                                                                      There were no vitals taken for this visit.  HEENT-  Normocephalic, no lesions, without obvious abnormality.  Normal external eye and conjunctiva.  Normal TM's bilaterally.  Normal auditory canals and external ears. Normal external nose, mucus membranes and septum.  Normal pharynx. Cardiovascular- irregularly irregular rhythm, pulses palpable throughout   Lungs- chest clear, no wheezing, rales, normal symmetric air entry, Heart exam - S1, S2 normal, no murmur, no gallop, rate  regular Abdomen- normal findings: bowel sounds normal Extremities- no edema Lymph-no adenopathy palpable Musculoskeletal-no joint tenderness, deformity or swelling Skin-warm and dry, no hyperpigmentation, vitiligo, or suspicious lesions  Neurological Examination Mental Status: Alert, oriented.  Speech fluent without evidence of aphasia.  Able to follow simple commands without difficulty. Cranial Nerves: II: left field cut, pupils equal, round, reactive to light and accommodation III,IV, VI: ptosis not present, right gaze deviation and unable to cross midline to the left V,VII: smile asymmetric on the left, neglecting sensation to the left face VIII: hearing normal bilaterally IX,X: uvula rises symmetrically XI: bilateral shoulder shrug XII: midline tongue extension Motor: Right : Upper extremity   5/5    Left:     Upper extremity   3/5  Lower extremity   5/5     Lower extremity   3/5 Tone and bulk:normal  tone throughout; no atrophy noted Sensory: left sided neglect and decreased sensation Deep Tendon Reflexes: 2+ and symmetric throughout Plantars: Right: downgoing   Left: downgoing Cerebellar: normal finger-to-nose on the right Gait: not tested       Lab Results: Basic Metabolic Panel:  Recent Labs Lab 11/02/15 1120  NA 141  K 3.4*  CL 102  GLUCOSE 120*  BUN 15  CREATININE 0.90    Liver Function Tests: No results for input(s): AST, ALT, ALKPHOS, BILITOT, PROT, ALBUMIN in the last 168 hours. No results for input(s): LIPASE, AMYLASE in the last 168 hours. No results for input(s): AMMONIA in the last 168 hours.  CBC:  Recent Labs Lab 11/02/15 1120  HGB 15.6*  HCT 46.0    Cardiac Enzymes: No results for input(s): CKTOTAL, CKMB, CKMBINDEX, TROPONINI in the last 168 hours.  Lipid Panel: No results for input(s): CHOL, TRIG, HDL, CHOLHDL, VLDL, LDLCALC in the last 168 hours.  CBG: No results for input(s): GLUCAP in the last 168 hours.  Microbiology: Results for orders placed or performed during the hospital encounter of 09/16/14  Blood culture (routine x 2)     Status: None   Collection Time: 09/16/14 12:43 PM  Result Value Ref Range Status   Specimen Description BLOOD RIGHT HAND  Final   Special Requests BOTTLES DRAWN AEROBIC AND ANAEROBIC 5CC EACH  Final   Culture   Final    NO GROWTH 5 DAYS Performed at [facility]    Report Status 09/22/2014 FINAL  Final  Blood culture (routine x 2)     Status: None   Collection Time: 09/16/14 12:49 PM  Result Value Ref Range Status   Specimen Description BLOOD LEFT HAND  Final   Special Requests BOTTLES DRAWN AEROBIC AND ANAEROBIC 5CC EACH  Final   Culture   Final    NO GROWTH 5 DAYS Performed at [facility]    Report Status 09/22/2014 FINAL  Final    Coagulation Studies: No results for input(s): LABPROT, INR in the last 72 hours.  Imaging: No results found.     Assessment and plan  discussed with with attending physician and they are in agreement.    Etta Quill PA-C [facility] [phone]  11/02/2015, 11:25 AM   Assessment: 50 y.o. female   Stroke Risk Factors - atrial fibrillation and hypertension

## 2015-11-02 NOTE — Consult Note (Signed)
Reason for Consult: atrial fibrillation with RVR  Requesting Physician: Erlinda Hong  Cardiologist: Verl Blalock  HPI: This is a 50 y.o. female with a past medical history significant for HTN, recurrent persistent atrial fibrillation and atrial flutter, noncompliant with anticoagulation x 6 months, presenting with today L facial droop/L hemiplegia with abrupt onset as well as atrial fibrillation with RVR. Rate has improved with IV diltiazem, but BP is low, although improving with IV fluids. tPA given at 1140h, roughly 70 minutes after onset of deficit.  Has a previous TIA and long history of noncompliance with meds and follow up. Xarelto prescribed at DC from hospital Jan 2016 and refilled by PCP July 2016. CHADSVasc score 4. She stopped it 6 months ago after seeing scary ads about Xarelto complications on TV.  Also bears diagnosis of COPD and has been hospitalized for pneumonia. Last ECG in Jan 2016 showed NSR. Most recent assessment of EF by TEE 2014, 45-50% (although 55-60% by transthoracic echo a few days earlier). Mildly dilated LA, LVH present. No valvular abnormalities of import.  PMHx:  Past Medical History  Diagnosis Date  . Paroxysmal a-fib ([place])   . Shortness of breath   . Hypertension   . Transient ischemic attack (TIA) 01/2013  . Encounter for long-term (current) use of other medications   . Heart murmur   . Pneumonia 02/2011; 08/2011; 03/22/2014   Past Surgical History  Procedure Laterality Date  . Tee without cardioversion N/A 02/18/2013    Procedure: TRANSESOPHAGEAL ECHOCARDIOGRAM (TEE);  Surgeon: Sanda Klein, MD;  Location: [place];  Service: Cardiovascular;  Laterality: N/A;  . Cardioversion N/A 02/18/2013    Procedure: CARDIOVERSION;  Surgeon: Sanda Klein, MD;  Location: [facility] END[facility]COPY;  Service: Cardiovascular;  Laterality: N/A;  . Cardioversion N/A 02/23/2013    Procedure: CARDIOVERSION;  Surgeon: Leonie Man, MD;  Location: [place];  Service: Cardiovascular;   Laterality: N/A;  BESIDE CV  . Tubal ligation  1992    FAMHx: Family History  Problem Relation Age of Onset  . Hypertension    . Diabetes    . Cancer Mother   . Hypertension Father   . Atrial fibrillation Father   . Peripheral vascular disease Father     SOCHx:  reports that she quit smoking about 2 years ago. Her smoking use included Cigars. She has never used smokeless tobacco. She reports that she drinks about 3.6 oz of alcohol per week. She reports that she does not use illicit drugs.  ALLERGIES: Allergies  Allergen Reactions  . Penicillins Hives and Other (See Comments)    unknown    ROS: Review of systems not obtained due to patient factors. Unable to speak at this time  HOME MEDICATIONS: Current Facility-Administered Medications on File Prior to Encounter  Medication Dose Route Frequency Provider Last Rate Last Dose  . diltiazem (CARDIZEM) 1 mg/mL load via infusion 10 mg  10 mg Intravenous Once Vira Blanco, MD      . diltiazem (CARDIZEM) 100 mg in dextrose 5 % 100 mL (1 mg/mL) infusion  5-15 mg/hr Intravenous Titrated Vira Blanco, MD       Current Outpatient Prescriptions on File Prior to Encounter  Medication Sig Dispense Refill  . hydrALAZINE (APRESOLINE) 50 MG tablet Take 1 tablet (50 mg total) by mouth 3 (three) times daily. 180 tablet 3  . hydrochlorothiazide (MICROZIDE) 12.5 MG capsule Take 1 capsule (12.5 mg total) by mouth daily. 90 capsule 3  . metoprolol tartrate (LOPRESSOR) 25 MG tablet  Take 1 tablet (25 mg total) by mouth 2 (two) times daily. 180 tablet 3  . rivaroxaban (XARELTO) 20 MG TABS tablet Take 1 tablet (20 mg total) by mouth daily with supper. 90 tablet 3     VITALS: Blood pressure 118/91, pulse 114, temperature 98.6 F (37 C), resp. rate 37, weight 86 kg (189 lb 9.5 oz), SpO2 93 %.  PHYSICAL EXAM:  General: Alert, oriented x3, a little agitated Head: no evidence of trauma, PERRL, EOMI, no exophtalmos or lid lag, no myxedema, no  xanthelasma; normal ears, nose and oropharynx Neck: normal jugular venous pulsations and no hepatojugular reflux; brisk carotid pulses without delay and no carotid bruits Chest: clear to auscultation, no signs of consolidation by percussion or palpation, normal fremitus, symmetrical and full respiratory excursions Cardiovascular: normal position and quality of the apical impulse, irregular rhythm, normal first heart sound and normal second heart sound, no rubs or gallops, no murmur Abdomen: no tenderness or distention, no masses by palpation, no abnormal pulsatility or arterial bruits, normal bowel sounds, no hepatosplenomegaly Extremities: no clubbing, cyanosis;  no edema; 2+ radial, ulnar and brachial pulses bilaterally; 2+ right femoral, posterior tibial and dorsalis pedis pulses; 2+ left femoral, posterior tibial and dorsalis pedis pulses; no subclavian or femoral bruits Neurological: left hemiplegia and facial droop, rightward gaze   LABS  CBC  Recent Labs  11/02/15 1114 11/02/15 1120  WBC 9.7  --   NEUTROABS 6.9  --   HGB 13.0 15.6*  HCT 41.3 46.0  MCV 82.9  --   PLT 225  --    Basic Metabolic Panel  Recent Labs  11/02/15 1114 11/02/15 1120  NA 141 141  K 3.3* 3.4*  CL 106 102  CO2 23  --   GLUCOSE 123* 120*  BUN 12 15  CREATININE 0.99 0.90  CALCIUM 9.2  --    Liver Function Tests  Recent Labs  11/02/15 1114  AST 47*  ALT 54  ALKPHOS 85  BILITOT 1.1  PROT 7.3  ALBUMIN 3.4*    IMAGING: Ct Angio Head W/cm &/or Wo Cm  11/02/2015  CLINICAL DATA:  Right-sided weakness. EXAM: CT ANGIOGRAPHY HEAD AND NECK TECHNIQUE: Multidetector CT imaging of the head and neck was performed using the standard protocol during bolus administration of intravenous contrast. Multiplanar CT image reconstructions and MIPs were obtained to evaluate the vascular anatomy. Carotid stenosis measurements (when applicable) are obtained utilizing NASCET criteria, using the distal internal  carotid diameter as the denominator. CT perfusion was performed through the cerebral hemispheres after bolus administration of IV contrast. CONTRAST:  DOSE CURRENTLY NOT AVAILABLE.  REFERENCE EMR COMPARISON:  None. FINDINGS: These results were called by telephone at the time of interpretation on 11/02/2015 at 12:32 pm to Dr. Silverio Decamp, who verbally acknowledged these results. CTA NECK Aortic arch: No visualized aneurysm or dissection flap. Four vessel branching. Low-density appearing fluid around the aortic arch attributed to pericardial fluid in the upper recesses. Right carotid system: Mainly noncalcified plaque at the bifurcation with positive remodeling. No flow limiting stenosis. No ulcerated plaque or dissection. Left carotid system: Predominantly noncalcified plaque at the bifurcation with approximately 30% stenosis. No dissection flap or ulcerated plaque. On coronal reformats ridge like irregularity along the medial aspect of the proximal ICA is likely kink. No convincing fibromuscular dysplasia or web. Vertebral arteries:Left vertebral artery originates from the arch. Right dominance. No flow limiting stenosis or dissection. Skeleton: No contributory finding. There is a sclerotic 12 mm lesion with low-density halo  left the midline in the anterior mandible. Other similar-appearing foci seen in the bilateral mandibular bodies, 6 mm on right. These are close to vital teeth apices. Other neck: No incidental mass or adenopathy. CTA HEAD Anterior circulation: Symmetric carotid artery size. Intact circle-of-Willis. Atherosclerotic calcification of the carotid siphons. Upper division distal M2 cut off on the right. No flow limiting proximal stenosis.  No aneurysm. Posterior circulation: Symmetric vertebral arteries. Symmetric vertebrobasilar branching. Diminutive P1 segments in the setting of large posterior communicating arteries. No vessel cut off. No aneurysm. Venous sinuses: Patent as permitted by contrast  timing. Anatomic variants: Intact circle-of-Willis CEREBRAL PERFUSION: Arterial and venous input its are appropriate. No confounding motion. Throughout the posterior upper division right MCA territory there is prolonged mean transit time, predominately with preserved cerebral blood flow, consistent with penumbra. Along the deep margin of the ischemic area there are patchy areas of probable completed infarct on subtraction maps. IMPRESSION: 1. Upper division distal right M2 cut off with more penumbra than infarct by CT perfusion. 2. Cervical carotid atherosclerosis without flow limiting stenosis. 3. 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 Electronically Signed   By: Monte Fantasia M.D.   On: 11/02/2015 12:46   Ct Head Wo Contrast  11/02/2015  CLINICAL DATA:  Code stroke.  Right side weakness. EXAM: CT HEAD WITHOUT CONTRAST TECHNIQUE: Contiguous axial images were obtained from the base of the skull through the vertex without intravenous contrast. COMPARISON:  02/05/2013 FINDINGS: No acute intracranial abnormality. Specifically, no hemorrhage, hydrocephalus, mass lesion, acute infarction, or significant intracranial injury. No acute calvarial abnormality. Visualized paranasal sinuses and mastoids clear. Orbital soft tissues unremarkable. IMPRESSION: No acute intracranial abnormality. Critical Value/emergent results were called by telephone at the time of interpretation on 11/02/2015 at 11:29 am to Dr. Silverio Decamp , who verbally acknowledged these results. Electronically Signed   By: Rolm Baptise M.D.   On: 11/02/2015 11:30   Ct Angio Neck W/cm &/or Wo/cm  11/02/2015  CLINICAL DATA:  Right-sided weakness. EXAM: CT ANGIOGRAPHY HEAD AND NECK TECHNIQUE: Multidetector CT imaging of the head and neck was performed using the standard protocol during bolus administration of intravenous contrast. Multiplanar CT image reconstructions and MIPs were obtained to evaluate the vascular anatomy. Carotid  stenosis measurements (when applicable) are obtained utilizing NASCET criteria, using the distal internal carotid diameter as the denominator. CT perfusion was performed through the cerebral hemispheres after bolus administration of IV contrast. CONTRAST:  DOSE CURRENTLY NOT AVAILABLE.  REFERENCE EMR COMPARISON:  None. FINDINGS: These results were called by telephone at the time of interpretation on 11/02/2015 at 12:32 pm to Dr. Silverio Decamp, who verbally acknowledged these results. CTA NECK Aortic arch: No visualized aneurysm or dissection flap. Four vessel branching. Low-density appearing fluid around the aortic arch attributed to pericardial fluid in the upper recesses. Right carotid system: Mainly noncalcified plaque at the bifurcation with positive remodeling. No flow limiting stenosis. No ulcerated plaque or dissection. Left carotid system: Predominantly noncalcified plaque at the bifurcation with approximately 30% stenosis. No dissection flap or ulcerated plaque. On coronal reformats ridge like irregularity along the medial aspect of the proximal ICA is likely kink. No convincing fibromuscular dysplasia or web. Vertebral arteries:Left vertebral artery originates from the arch. Right dominance. No flow limiting stenosis or dissection. Skeleton: No contributory finding. There is a sclerotic 12 mm lesion with low-density halo left the midline in the anterior mandible. Other similar-appearing foci seen in the bilateral mandibular bodies, 6 mm on right. These are close to vital teeth  apices. Other neck: No incidental mass or adenopathy. CTA HEAD Anterior circulation: Symmetric carotid artery size. Intact circle-of-Willis. Atherosclerotic calcification of the carotid siphons. Upper division distal M2 cut off on the right. No flow limiting proximal stenosis.  No aneurysm. Posterior circulation: Symmetric vertebral arteries. Symmetric vertebrobasilar branching. Diminutive P1 segments in the setting of large posterior  communicating arteries. No vessel cut off. No aneurysm. Venous sinuses: Patent as permitted by contrast timing. Anatomic variants: Intact circle-of-Willis CEREBRAL PERFUSION: Arterial and venous input its are appropriate. No confounding motion. Throughout the posterior upper division right MCA territory there is prolonged mean transit time, predominately with preserved cerebral blood flow, consistent with penumbra. Along the deep margin of the ischemic area there are patchy areas of probable completed infarct on subtraction maps. IMPRESSION: 1. Upper division distal right M2 cut off with more penumbra than infarct by CT perfusion. 2. Cervical carotid atherosclerosis without flow limiting stenosis. 3. 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 Electronically Signed   By: Monte Fantasia M.D.   On: 11/02/2015 12:46   Ct Cerebral Perfusion W/cm  11/02/2015  CLINICAL DATA:  Right-sided weakness. EXAM: CT ANGIOGRAPHY HEAD AND NECK TECHNIQUE: Multidetector CT imaging of the head and neck was performed using the standard protocol during bolus administration of intravenous contrast. Multiplanar CT image reconstructions and MIPs were obtained to evaluate the vascular anatomy. Carotid stenosis measurements (when applicable) are obtained utilizing NASCET criteria, using the distal internal carotid diameter as the denominator. CT perfusion was performed through the cerebral hemispheres after bolus administration of IV contrast. CONTRAST:  DOSE CURRENTLY NOT AVAILABLE.  REFERENCE EMR COMPARISON:  None. FINDINGS: These results were called by telephone at the time of interpretation on 11/02/2015 at 12:32 pm to Dr. Silverio Decamp, who verbally acknowledged these results. CTA NECK Aortic arch: No visualized aneurysm or dissection flap. Four vessel branching. Low-density appearing fluid around the aortic arch attributed to pericardial fluid in the upper recesses. Right carotid system: Mainly noncalcified plaque at  the bifurcation with positive remodeling. No flow limiting stenosis. No ulcerated plaque or dissection. Left carotid system: Predominantly noncalcified plaque at the bifurcation with approximately 30% stenosis. No dissection flap or ulcerated plaque. On coronal reformats ridge like irregularity along the medial aspect of the proximal ICA is likely kink. No convincing fibromuscular dysplasia or web. Vertebral arteries:Left vertebral artery originates from the arch. Right dominance. No flow limiting stenosis or dissection. Skeleton: No contributory finding. There is a sclerotic 12 mm lesion with low-density halo left the midline in the anterior mandible. Other similar-appearing foci seen in the bilateral mandibular bodies, 6 mm on right. These are close to vital teeth apices. Other neck: No incidental mass or adenopathy. CTA HEAD Anterior circulation: Symmetric carotid artery size. Intact circle-of-Willis. Atherosclerotic calcification of the carotid siphons. Upper division distal M2 cut off on the right. No flow limiting proximal stenosis.  No aneurysm. Posterior circulation: Symmetric vertebral arteries. Symmetric vertebrobasilar branching. Diminutive P1 segments in the setting of large posterior communicating arteries. No vessel cut off. No aneurysm. Venous sinuses: Patent as permitted by contrast timing. Anatomic variants: Intact circle-of-Willis CEREBRAL PERFUSION: Arterial and venous input its are appropriate. No confounding motion. Throughout the posterior upper division right MCA territory there is prolonged mean transit time, predominately with preserved cerebral blood flow, consistent with penumbra. Along the deep margin of the ischemic area there are patchy areas of probable completed infarct on subtraction maps. IMPRESSION: 1. Upper division distal right M2 cut off with more penumbra than infarct by CT  perfusion. 2. Cervical carotid atherosclerosis without flow limiting stenosis. 3. Three sclerotic jaw  lesions favoring cemento-osseous dysplasia. Electronically Signed   By: Monte Fantasia M.D.   On: 11/02/2015 12:46    ECG: atrial fibrillation, RVR, occ. aberrancy, LAFB, old ST-T changes, likely LVH related.  TELEMETRY: Rate down to 105 bpm now  IMPRESSION/RECOMMENDATION: 1. 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 Recurrent persistent atrial fibrillation with RVR on IV diltiazem - neurology prefers higher BP so will transition to IV amiodarone instead, for short term management (this would not be a good long term choice in this young patient with spotty compliance with f/u). 3. History of HTN with HTNive heart disease (LVH, possible mildly reduced LVEF), but without overt HF 4. Hypokalemia - to be replaced   Time Spent Directly with Patient: 45 minutes  Sanda Klein, MD, [facility] [phone] office [phone] pager   11/02/2015, 1:45 PM

## 2015-11-02 NOTE — ED Notes (Signed)
Pt had sudden on set 1030 of Left sided facial droop, slurring, and left sided weakness. BP 150/90 . HR 140 aFIB. cbg 164

## 2015-11-02 NOTE — ED Notes (Signed)
Pt has small round reddened area to right neck and left cheek. Notified Neurologist. No angioedema noted.

## 2015-11-02 NOTE — ED Notes (Signed)
Pts family at bedside

## 2015-11-02 NOTE — ED Notes (Signed)
MD informed pt that she is no longer candidate for IR. Pt will not be going for procedure

## 2015-11-02 NOTE — ED Notes (Signed)
Neurologist speaking with family concerning last dose of blood thinner

## 2015-11-02 NOTE — Code Documentation (Signed)
50yo female arriving to [facility] at 57 via [place].  Patient from work where she had sudden onset left sided weakness and facial droop.  EMS called and activated a code stroke.  Stroke team at the bedside on patient arrival.  Labs drawn and patient cleared for CT by Dr. Adela Glimpse.  Patient to CT with team.  EMR shows patient to be taking Xarelto, however, patient denies taking Xarelto.  Patient back to Trauma A.  NIHSS 8, see documentation for details and code stroke times.  Patient initially with left arm and leg drift, left facial droop, right partial gaze and left neglect.  Patient's pharmacies on record called x2 and confirmed patient has not had prescription filled recently.  Dr. Silverio Decamp at the bedside and order to give tPA.  Pharmacist at the bedside to mix tPA.  Patient's BP taken manually d/t inconsistent readings with the automatic cuff.  BP low and NS boluses given.  Patient in afib with RVR and Cardizem gtt started per EDP.  Family arrived and reporting patient is taking Xarelto.  Slight delay in tPA administration d/t determining if patient was taking Xarelto.  Patient again reports not taking Xarelto in 6 months.  tPA 8mg  bolus given at 1141 over 1 minute followed by 69mg /hr for a total of 77mg  per pharmacy dosing.  Patient to CT for CTA and CTP once BP stabilized.  Patient with improving left sided weakness.  Patient reports headache that is improving, patient had headache prior to tPA administration, MD aware.  Patient back to Trauma A.  Patient monitored frequently per post-tPA protocol.  Patient itching with a few raised bumps to her right face and left neck.  Dr. Silverio Decamp made aware and Benadryl and Solumedrol given per MD order, tPA continued per MD order.  MD discussed endovascular intervention with patient and family, however, eventually decision made not to proceed with further intervention.  Bedside handoff with ED RN Hope.

## 2015-11-02 NOTE — Progress Notes (Signed)
Pharmacy tPA Bedside  Order from Dr Silverio Decamp given verbally at 1120 alteplase was mixed at bedside and ready for administration at 1128  Original vial had to be discarded due to defective tubing and possible loss of bolus dose, so alteplase had to be remade (the remade vial was available at 1132 (delay of administration due to PT/INR not back yet and confusion over if the patient was taking Xarelto)  Dr. Silverio Decamp was ok with PT of 15.6 (which is above 15s for contraindication). Also family was unsure of last dose of Xarelto - pt stated hasn't taken in 6 months - called both pharmacies on file and has not filled since July 2016  Levester Fresh, PharmD, BCPS, [facility] Clinical Pharmacist Pager [phone] 11/02/2015 11:58 AM

## 2015-11-02 NOTE — ED Notes (Signed)
Small reddened area on right neck and left cheek have disappeared

## 2015-11-02 NOTE — ED Notes (Signed)
TPA initiated at 1141

## 2015-11-02 NOTE — ED Notes (Signed)
Neurologist speaking with pt and  pt's family about removing clot

## 2015-11-02 NOTE — ED Notes (Signed)
Neurologist gave verbal order to start TPA

## 2015-11-02 NOTE — Consult Note (Signed)
duplicate

## 2015-11-02 NOTE — ED Notes (Signed)
TPA mixed and at bedside

## 2015-11-02 NOTE — ED Provider Notes (Signed)
CSN: [id number]     Arrival date & time 11/02/15  1109 History   First MD Initiated Contact with Patient 11/02/15 1113     Chief Complaint  Patient presents with  . Code Stroke   Patient is a 50 y.o. female presenting with neurologic complaint. The history is provided by the patient and the EMS personnel. No language interpreter was used.  Neurologic Problem This is a new problem. The current episode started today. The problem occurs constantly. The problem has been gradually worsening. Associated symptoms include weakness. Pertinent negatives include no congestion or nausea. Nothing aggravates the symptoms. She has tried nothing for the symptoms. The treatment provided no relief.    Past Medical History  Diagnosis Date  . Paroxysmal a-fib ([place])   . Shortness of breath   . Hypertension   . Transient ischemic attack (TIA) 01/2013  . Encounter for long-term (current) use of other medications   . Heart murmur   . Pneumonia 02/2011; 08/2011; 03/22/2014   Past Surgical History  Procedure Laterality Date  . Tee without cardioversion N/A 02/18/2013    Procedure: TRANSESOPHAGEAL ECHOCARDIOGRAM (TEE);  Surgeon: Sanda Klein, MD;  Location: [place];  Service: Cardiovascular;  Laterality: N/A;  . Cardioversion N/A 02/18/2013    Procedure: CARDIOVERSION;  Surgeon: Sanda Klein, MD;  Location: [facility] ENDOSCOPY;  Service: Cardiovascular;  Laterality: N/A;  . Cardioversion N/A 02/23/2013    Procedure: CARDIOVERSION;  Surgeon: Leonie Man, MD;  Location: [place];  Service: Cardiovascular;  Laterality: N/A;  BESIDE CV  . Tubal ligation  1992   Family History  Problem Relation Age of Onset  . Hypertension    . Diabetes    . Cancer Mother   . Hypertension Father   . Atrial fibrillation Father   . Peripheral vascular disease Father    Social History  Substance Use Topics  . Smoking status: Former Smoker -- 3 years    Types: Cigars    Quit date: 03/15/2013  . Smokeless tobacco: Never Used  .  Alcohol Use: 3.6 oz/week    6 Cans of beer per week     Comment: only on weekends   OB History    No data available     Review of Systems  Constitutional: Negative for activity change and appetite change.  HENT: Negative for congestion and dental problem.   Eyes: Negative for photophobia.  Respiratory: Negative for shortness of breath and wheezing.   Cardiovascular: Positive for palpitations.  Gastrointestinal: Negative for nausea, diarrhea, constipation and rectal pain.  Endocrine: Negative for polydipsia and polyuria.  Genitourinary: Negative for dysuria, frequency and flank pain.  Musculoskeletal: Positive for gait problem.  Neurological: Positive for weakness.  All other systems reviewed and are negative.     Allergies  Penicillins  Home Medications   Prior to Admission medications   Medication Sig Start Date End Date Taking? Authorizing Provider  hydrALAZINE (APRESOLINE) 50 MG tablet Take 1 tablet (50 mg total) by mouth 3 (three) times daily. 03/22/15   Tresa Garter, MD  hydrochlorothiazide (MICROZIDE) 12.5 MG capsule Take 1 capsule (12.5 mg total) by mouth daily. 03/22/15   Tresa Garter, MD  metoprolol tartrate (LOPRESSOR) 25 MG tablet Take 1 tablet (25 mg total) by mouth 2 (two) times daily. 03/22/15   Tresa Garter, MD  rivaroxaban (XARELTO) 20 MG TABS tablet Take 1 tablet (20 mg total) by mouth daily with supper. 03/22/15   Tresa Garter, MD   BP 131/100 mmHg  Pulse 113  Temp(Src) 98.8 F (37.1 C)  Resp 35  Wt 86 kg  SpO2 97% Physical Exam  Constitutional: She appears well-developed and well-nourished. No distress.  HENT:  Head: Normocephalic and atraumatic.  Eyes: Pupils are equal, round, and reactive to light.  Neck: Normal range of motion. Neck supple. No JVD present.  Cardiovascular: Intact distal pulses.  An irregularly irregular rhythm present. Tachycardia present.   Tachycardic to 140s. Consistent with atrial fibrillation.   Pulmonary/Chest: No stridor. No respiratory distress. She has no wheezes.  Abdominal: Soft. She exhibits no distension. There is no tenderness. There is no rebound and no guarding.  Lymphadenopathy:    She has no cervical adenopathy.  Neurological: She is alert. A cranial nerve deficit and sensory deficit is present. She exhibits abnormal muscle tone. GCS eye subscore is 4. GCS verbal subscore is 5. GCS motor subscore is 6.  Right facial droop, left upper and left lower extremity weakness.  Skin: She is not diaphoretic.  Vitals reviewed.   ED Course  Procedures (including critical care time) Labs Review Labs Reviewed  PROTIME-INR - Abnormal; Notable for the following:    Prothrombin Time 15.6 (*)    All other components within normal limits  COMPREHENSIVE METABOLIC PANEL - Abnormal; Notable for the following:    Potassium 3.3 (*)    Glucose, Bld 123 (*)    Albumin 3.4 (*)    AST 47 (*)    All other components within normal limits  URINALYSIS, ROUTINE W REFLEX MICROSCOPIC (NOT AT [facility]) - Abnormal; Notable for the following:    Leukocytes, UA SMALL (*)    All other components within normal limits  URINE MICROSCOPIC-ADD ON - Abnormal; Notable for the following:    Squamous Epithelial / LPF 6-30 (*)    Bacteria, UA FEW (*)    All other components within normal limits  I-STAT CHEM 8, ED - Abnormal; Notable for the following:    Potassium 3.4 (*)    Glucose, Bld 120 (*)    Calcium, Ion 1.04 (*)    Hemoglobin 15.6 (*)    All other components within normal limits  ETHANOL  APTT  CBC  DIFFERENTIAL  URINE RAPID DRUG SCREEN, HOSP PERFORMED  I-STAT TROPOININ, ED    Imaging Review Ct Angio Head W/cm &/or Wo Cm  11/02/2015  CLINICAL DATA:  Right-sided weakness. EXAM: CT ANGIOGRAPHY HEAD AND NECK TECHNIQUE: Multidetector CT imaging of the head and neck was performed using the standard protocol during bolus administration of intravenous contrast. Multiplanar CT image reconstructions  and MIPs were obtained to evaluate the vascular anatomy. Carotid stenosis measurements (when applicable) are obtained utilizing NASCET criteria, using the distal internal carotid diameter as the denominator. CT perfusion was performed through the cerebral hemispheres after bolus administration of IV contrast. CONTRAST:  DOSE CURRENTLY NOT AVAILABLE.  REFERENCE EMR COMPARISON:  None. FINDINGS: These results were called by telephone at the time of interpretation on 11/02/2015 at 12:32 pm to Dr. Silverio Decamp, who verbally acknowledged these results. CTA NECK Aortic arch: No visualized aneurysm or dissection flap. Four vessel branching. Low-density appearing fluid around the aortic arch attributed to pericardial fluid in the upper recesses. Right carotid system: Mainly noncalcified plaque at the bifurcation with positive remodeling. No flow limiting stenosis. No ulcerated plaque or dissection. Left carotid system: Predominantly noncalcified plaque at the bifurcation with approximately 30% stenosis. No dissection flap or ulcerated plaque. On coronal reformats ridge like irregularity along the medial aspect of the proximal ICA is  likely kink. No convincing fibromuscular dysplasia or web. Vertebral arteries:Left vertebral artery originates from the arch. Right dominance. No flow limiting stenosis or dissection. Skeleton: No contributory finding. There is a sclerotic 12 mm lesion with low-density halo left the midline in the anterior mandible. Other similar-appearing foci seen in the bilateral mandibular bodies, 6 mm on right. These are close to vital teeth apices. Other neck: No incidental mass or adenopathy. CTA HEAD Anterior circulation: Symmetric carotid artery size. Intact circle-of-Willis. Atherosclerotic calcification of the carotid siphons. Upper division distal M2 cut off on the right. No flow limiting proximal stenosis.  No aneurysm. Posterior circulation: Symmetric vertebral arteries. Symmetric vertebrobasilar  branching. Diminutive P1 segments in the setting of large posterior communicating arteries. No vessel cut off. No aneurysm. Venous sinuses: Patent as permitted by contrast timing. Anatomic variants: Intact circle-of-Willis CEREBRAL PERFUSION: Arterial and venous input its are appropriate. No confounding motion. Throughout the posterior upper division right MCA territory there is prolonged mean transit time, predominately with preserved cerebral blood flow, consistent with penumbra. Along the deep margin of the ischemic area there are patchy areas of probable completed infarct on subtraction maps. IMPRESSION: 1. Upper division distal right M2 cut off with more penumbra than infarct by CT perfusion. 2. Cervical carotid atherosclerosis without flow limiting stenosis. 3. 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 Electronically Signed   By: Monte Fantasia M.D.   On: 11/02/2015 12:46   Ct Head Wo Contrast  11/02/2015  CLINICAL DATA:  Code stroke.  Right side weakness. EXAM: CT HEAD WITHOUT CONTRAST TECHNIQUE: Contiguous axial images were obtained from the base of the skull through the vertex without intravenous contrast. COMPARISON:  02/05/2013 FINDINGS: No acute intracranial abnormality. Specifically, no hemorrhage, hydrocephalus, mass lesion, acute infarction, or significant intracranial injury. No acute calvarial abnormality. Visualized paranasal sinuses and mastoids clear. Orbital soft tissues unremarkable. IMPRESSION: No acute intracranial abnormality. Critical Value/emergent results were called by telephone at the time of interpretation on 11/02/2015 at 11:29 am to Dr. Silverio Decamp , who verbally acknowledged these results. Electronically Signed   By: Rolm Baptise M.D.   On: 11/02/2015 11:30   Ct Angio Neck W/cm &/or Wo/cm  11/02/2015  CLINICAL DATA:  Right-sided weakness. EXAM: CT ANGIOGRAPHY HEAD AND NECK TECHNIQUE: Multidetector CT imaging of the head and neck was performed using the  standard protocol during bolus administration of intravenous contrast. Multiplanar CT image reconstructions and MIPs were obtained to evaluate the vascular anatomy. Carotid stenosis measurements (when applicable) are obtained utilizing NASCET criteria, using the distal internal carotid diameter as the denominator. CT perfusion was performed through the cerebral hemispheres after bolus administration of IV contrast. CONTRAST:  DOSE CURRENTLY NOT AVAILABLE.  REFERENCE EMR COMPARISON:  None. FINDINGS: These results were called by telephone at the time of interpretation on 11/02/2015 at 12:32 pm to Dr. Silverio Decamp, who verbally acknowledged these results. CTA NECK Aortic arch: No visualized aneurysm or dissection flap. Four vessel branching. Low-density appearing fluid around the aortic arch attributed to pericardial fluid in the upper recesses. Right carotid system: Mainly noncalcified plaque at the bifurcation with positive remodeling. No flow limiting stenosis. No ulcerated plaque or dissection. Left carotid system: Predominantly noncalcified plaque at the bifurcation with approximately 30% stenosis. No dissection flap or ulcerated plaque. On coronal reformats ridge like irregularity along the medial aspect of the proximal ICA is likely kink. No convincing fibromuscular dysplasia or web. Vertebral arteries:Left vertebral artery originates from the arch. Right dominance. No flow limiting stenosis or dissection. Skeleton: No  contributory finding. There is a sclerotic 12 mm lesion with low-density halo left the midline in the anterior mandible. Other similar-appearing foci seen in the bilateral mandibular bodies, 6 mm on right. These are close to vital teeth apices. Other neck: No incidental mass or adenopathy. CTA HEAD Anterior circulation: Symmetric carotid artery size. Intact circle-of-Willis. Atherosclerotic calcification of the carotid siphons. Upper division distal M2 cut off on the right. No flow limiting proximal  stenosis.  No aneurysm. Posterior circulation: Symmetric vertebral arteries. Symmetric vertebrobasilar branching. Diminutive P1 segments in the setting of large posterior communicating arteries. No vessel cut off. No aneurysm. Venous sinuses: Patent as permitted by contrast timing. Anatomic variants: Intact circle-of-Willis CEREBRAL PERFUSION: Arterial and venous input its are appropriate. No confounding motion. Throughout the posterior upper division right MCA territory there is prolonged mean transit time, predominately with preserved cerebral blood flow, consistent with penumbra. Along the deep margin of the ischemic area there are patchy areas of probable completed infarct on subtraction maps. IMPRESSION: 1. Upper division distal right M2 cut off with more penumbra than infarct by CT perfusion. 2. Cervical carotid atherosclerosis without flow limiting stenosis. 3. 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 Electronically Signed   By: Monte Fantasia M.D.   On: 11/02/2015 12:46   Ct Cerebral Perfusion W/cm  11/02/2015  CLINICAL DATA:  Right-sided weakness. EXAM: CT ANGIOGRAPHY HEAD AND NECK TECHNIQUE: Multidetector CT imaging of the head and neck was performed using the standard protocol during bolus administration of intravenous contrast. Multiplanar CT image reconstructions and MIPs were obtained to evaluate the vascular anatomy. Carotid stenosis measurements (when applicable) are obtained utilizing NASCET criteria, using the distal internal carotid diameter as the denominator. CT perfusion was performed through the cerebral hemispheres after bolus administration of IV contrast. CONTRAST:  DOSE CURRENTLY NOT AVAILABLE.  REFERENCE EMR COMPARISON:  None. FINDINGS: These results were called by telephone at the time of interpretation on 11/02/2015 at 12:32 pm to Dr. Silverio Decamp, who verbally acknowledged these results. CTA NECK Aortic arch: No visualized aneurysm or dissection flap. Four vessel  branching. Low-density appearing fluid around the aortic arch attributed to pericardial fluid in the upper recesses. Right carotid system: Mainly noncalcified plaque at the bifurcation with positive remodeling. No flow limiting stenosis. No ulcerated plaque or dissection. Left carotid system: Predominantly noncalcified plaque at the bifurcation with approximately 30% stenosis. No dissection flap or ulcerated plaque. On coronal reformats ridge like irregularity along the medial aspect of the proximal ICA is likely kink. No convincing fibromuscular dysplasia or web. Vertebral arteries:Left vertebral artery originates from the arch. Right dominance. No flow limiting stenosis or dissection. Skeleton: No contributory finding. There is a sclerotic 12 mm lesion with low-density halo left the midline in the anterior mandible. Other similar-appearing foci seen in the bilateral mandibular bodies, 6 mm on right. These are close to vital teeth apices. Other neck: No incidental mass or adenopathy. CTA HEAD Anterior circulation: Symmetric carotid artery size. Intact circle-of-Willis. Atherosclerotic calcification of the carotid siphons. Upper division distal M2 cut off on the right. No flow limiting proximal stenosis.  No aneurysm. Posterior circulation: Symmetric vertebral arteries. Symmetric vertebrobasilar branching. Diminutive P1 segments in the setting of large posterior communicating arteries. No vessel cut off. No aneurysm. Venous sinuses: Patent as permitted by contrast timing. Anatomic variants: Intact circle-of-Willis CEREBRAL PERFUSION: Arterial and venous input its are appropriate. No confounding motion. Throughout the posterior upper division right MCA territory there is prolonged mean transit time, predominately with preserved cerebral blood flow,  consistent with penumbra. Along the deep margin of the ischemic area there are patchy areas of probable completed infarct on subtraction maps. IMPRESSION: 1. Upper  division distal right M2 cut off with more penumbra than infarct by CT perfusion. 2. Cervical carotid atherosclerosis without flow limiting stenosis. 3. 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 Electronically Signed   By: Monte Fantasia M.D.   On: 11/02/2015 12:46   I have personally reviewed and evaluated these images and lab results as part of my medical decision-making.   EKG Interpretation None      MDM   Final diagnoses:  Atrial fibrillation, unspecified type (HCC)  Cerebral infarction due to embolism of right middle cerebral artery ([place])    50 year old woman with history of atrial fibrillation. She Xarelto but stopped taking it due to listening to commercials on TV. She presents as a code stroke due to onset of right facial droop and left upper and lower extremity weakness at 10:30 this morning. Airway intact upon arrival. Blood sugar normal.  Patient taken to CT scanner where CT had revealed no bleeding. Neurology at bedside, exam consistent with right MCA stroke. TPA given per neurology.  Heart rate 120s to 140s, consistent with atrial fibrillation. BP soft but normal when cuff readjusted. Patient given 10 mg diltiazem bolus followed by infusion for A. fib with RVR. Patient's neuro deficits are improving.  Patient taken to CT scanner for CTA to evaluate for potential interventional radiology procedure. Patient to be admitted to neuro ICU following CT scan.  Cardiology consultation by neurology for A. fib with RVR.  Discussed with my attending, Dr. Ashok Cordia.    Vira Blanco, MD 11/02/15 [place], MD 11/03/15 1254

## 2015-11-02 NOTE — ED Notes (Signed)
Pt gave verbal consent to MD to go to IR and have procedure.

## 2015-11-03 ENCOUNTER — Inpatient Hospital Stay (HOSPITAL_COMMUNITY): Payer: Self-pay

## 2015-11-03 ENCOUNTER — Encounter (HOSPITAL_COMMUNITY): Payer: Self-pay | Admitting: Radiology

## 2015-11-03 DIAGNOSIS — I6789 Other cerebrovascular disease: Secondary | ICD-10-CM

## 2015-11-03 LAB — BASIC METABOLIC PANEL
ANION GAP: 13 (ref 5–15)
BUN: 11 mg/dL (ref 6–20)
CALCIUM: 8.9 mg/dL (ref 8.9–10.3)
CO2: 22 mmol/L (ref 22–32)
CREATININE: 1.01 mg/dL — AB (ref 0.44–1.00)
Chloride: 107 mmol/L (ref 101–111)
GFR calc Af Amer: >60 mL/min (ref >60)
GLUCOSE: 132 mg/dL — AB (ref 65–99)
Potassium: 3.9 mmol/L (ref 3.5–5.1)
Sodium: 142 mmol/L (ref 135–145)

## 2015-11-03 LAB — LIPID PANEL
Cholesterol: 131 mg/dL (ref 0–200)
HDL: 32 mg/dL — AB (ref >40)
LDL CALC: 89 mg/dL (ref 0–99)
TRIGLYCERIDES: 48 mg/dL (ref <150)
Total CHOL/HDL Ratio: 4.1 RATIO
VLDL: 10 mg/dL (ref 0–40)

## 2015-11-03 LAB — GLUCOSE, CAPILLARY
GLUCOSE-CAPILLARY: 126 mg/dL — AB (ref 65–99)
Glucose-Capillary: 144 mg/dL — ABNORMAL HIGH (ref 65–99)
Glucose-Capillary: 143 mg/dL — ABNORMAL HIGH (ref 65–99)
Glucose-Capillary: 127 mg/dL — ABNORMAL HIGH (ref 65–99)

## 2015-11-03 LAB — MAGNESIUM: MAGNESIUM: 1.8 mg/dL (ref 1.7–2.4)

## 2015-11-03 MED ORDER — POTASSIUM CHLORIDE CRYS ER 20 MEQ PO TBCR
20.0000 meq | EXTENDED_RELEASE_TABLET | Freq: Two times a day (BID) | ORAL | Status: AC
  Administered 2015-11-03 – 2015-11-04 (×4): 20 meq via ORAL
  Filled 2015-11-03 (×4): qty 1

## 2015-11-03 MED ORDER — ATORVASTATIN CALCIUM 20 MG PO TABS
20.0000 mg | ORAL_TABLET | Freq: Every day | ORAL | Status: DC
  Administered 2015-11-03 – 2015-11-05 (×3): 20 mg via ORAL
  Filled 2015-11-03 (×3): qty 1

## 2015-11-03 MED ORDER — PANTOPRAZOLE SODIUM 40 MG PO TBEC
40.0000 mg | DELAYED_RELEASE_TABLET | Freq: Every day | ORAL | Status: DC
  Administered 2015-11-03 – 2015-11-05 (×3): 40 mg via ORAL
  Filled 2015-11-03 (×3): qty 1

## 2015-11-03 MED ORDER — INSULIN ASPART 100 UNIT/ML ~~LOC~~ SOLN
0.0000 [IU] | Freq: Three times a day (TID) | SUBCUTANEOUS | Status: DC
  Administered 2015-11-03: 2 [IU] via SUBCUTANEOUS

## 2015-11-03 MED ORDER — ASPIRIN EC 325 MG PO TBEC
325.0000 mg | DELAYED_RELEASE_TABLET | Freq: Every day | ORAL | Status: DC
  Administered 2015-11-03 – 2015-11-06 (×4): 325 mg via ORAL
  Filled 2015-11-03 (×4): qty 1

## 2015-11-03 NOTE — Progress Notes (Signed)
Patient ID: [name], female   DOB: 1966/02/12, 50 y.o.   MRN: [id number]     Subjective:    Chronic palpitatins.   Objective:   Temp:  [97.5 F (36.4 C)-98.8 F (37.1 C)] 98 F (36.7 C) (02/18 0800) Pulse Rate:  [50-133] 91 (02/18 0900) Resp:  [15-39] 27 (02/18 0900) BP: (77-173)/(58-116) 114/89 mmHg (02/18 0900) SpO2:  [40 %-100 %] 98 % (02/18 0900) Weight:  [189 lb 9.5 oz (86 kg)] 189 lb 9.5 oz (86 kg) (02/17 1125)    Filed Weights   11/02/15 1125  Weight: 189 lb 9.5 oz (86 kg)    Intake/Output Summary (Last 24 hours) at 11/03/15 1123 Last data filed at 11/03/15 0900  Gross per 24 hour  Intake 2167.99 ml  Output   1860 ml  Net 307.99 ml    Telemetry: afib rate 100  Exam:  General: NAD  HEENT: sclera clear, throat clear  Resp: CTAB  Cardiac: irreg, rate 100, no mr/g  GI: abdomen soft, NT, ND  MSK: no LE edema  Neuro: no focal deficits  Psych: appropriate affect  Lab Results:  Basic Metabolic Panel:  Recent Labs Lab 11/02/15 1114 11/02/15 1120  NA 141 141  K 3.3* 3.4*  CL 106 102  CO2 23  --   GLUCOSE 123* 120*  BUN 12 15  CREATININE 0.99 0.90  CALCIUM 9.2  --     Liver Function Tests:  Recent Labs Lab 11/02/15 1114  AST 47*  ALT 54  ALKPHOS 85  BILITOT 1.1  PROT 7.3  ALBUMIN 3.4*    CBC:  Recent Labs Lab 11/02/15 1114 11/02/15 1120  WBC 9.7  --   HGB 13.0 15.6*  HCT 41.3 46.0  MCV 82.9  --   PLT 225  --     Cardiac Enzymes: No results for input(s): CKTOTAL, CKMB, CKMBINDEX, TROPONINI in the last 168 hours.  BNP: No results for input(s): PROBNP in the last 8760 hours.  Coagulation:  Recent Labs Lab 11/02/15 1114  INR 1.23    ECG:   Medications:   Scheduled Medications: .  stroke: mapping our early stages of recovery book   Does not apply Once  . antiseptic oral rinse  7 mL Mouth Rinse BID  . atorvastatin  20 mg Oral q1800  . pantoprazole (PROTONIX) IV  40 mg Intravenous QHS     Infusions: .  sodium chloride 75 mL/hr at 11/02/15 1900  . diltiazem (CARDIZEM) infusion 15 mg/hr (11/03/15 0934)     PRN Medications:  acetaminophen **OR** acetaminophen, labetalol, senna-docusate     Assessment/Plan    1. Acute embolic right hemispheric stroke with severe deficits, given tPA and seems to be improving - management per neurology - defer timing for initiation of anticoag for afib to neuro  2. PAF - on dilt gtt, Dr Victorino December notes mention transitioning to [facility] if bp's are an issue, given the higher bp goals s/p stroke. Appears she was on amio yesterday afternoon however it was stopped and now back on IV dilt, from records I cannot see what caused the change. In talking to nursing staff he believes amio dropped her blood pressure. Vitals and tele reviewed around 3pm yesterday when it was stopped, do not see significant change on amio.  - from notes patient with history of poor compliance, stopped xarelto on her own at home 6 months ago - echo pending - can continue dilt gtt for now, I am not exactly sure what happened  on amio. If higher blood pressures are desired by neuro we could consider a digoxin load. Will continue dilt gtt for now to allow titration, over next few days convert to oral regimen. Rate control <110 is reasonable target.       Carlyle Dolly, M.D.

## 2015-11-03 NOTE — Progress Notes (Signed)
Echocardiogram 2D Echocardiogram has been performed.  Joelene Millin 11/03/2015, 11:31 AM

## 2015-11-03 NOTE — Progress Notes (Signed)
PT Cancellation Note  Patient Details Name: [name] MRN: [id number] DOB: Oct 28, 1965   Cancelled Treatment:    Reason Eval/Treat Not Completed: Patient not medically ready.  Patient remains on strict bedrest per orders - received tPA 11/02/15 at 11:41.  **MD:  Please address activity orders when appropriate for patient.  PT will initiate evaluation at that time.  Thank you.   Despina Pole 11/03/2015, 6:13 PM Carita Pian. Sanjuana Kava, [place] Pager [phone]

## 2015-11-03 NOTE — Progress Notes (Signed)
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 Separately she is sitting and having a meal with coworkers when the phone her in slumped to the left With left-sided. Time of onset was 10:30 AM. Patient has a known history of atrial fibrillation and was followed by cardiology. Known to have poor compliance with her medications as outpatient. She was on Xarelto previously for A. Fib but she self discontinued 6 months ago. A she reports that the reason for discontinuation as after she saw the TV attachments about xeralto causing bleeding problems. She denies any recent bleeding issues. No recent surgeries, head trauma, no history of prior strokes a recent MRI, No prior intracranial hemorrhage history. She only takes antihypertensive medicines, not on any antiplatelet or anticoagulant medications.  her initial neurologic examination when she presented to the ER Showed dense left hemi-neglect, With right left confusion, Moderate left hemiparesis that she had drift in left upper and lower extremities, And left facial weakness dysarthria.  she is also in A. Fib with rapid went for rate up to 140s and 150s while she was in the ER. Blood pressure recording in the monitor was inaccurate which showed systolics in Q000111Q to Q000111Q but diastolics about A999333. Manual blood pressure using a Doppler was obtained which confirmed hypertension with systolic blood pressures and 80s. She was immediately started on IV fluid boluses with normal saline 2 L which improved her blood pressure to A999333 to 0000000 systolic range. After confirming no contraindications , and that the last this is an option was greater than 6 months ago, Discussed the risk benefit of IV TPA which patient agreed to proceed with.   IV TPA was started in the ER at 11:40 AM , roughly about 1 hour 10  minutes after symptom onset. Within about 15 minutes after starting IV TPA, Patient was noted to have significant improvement of her left hemi-neglect. The left hemiparesis is also improved with no drift, And only had mild weakness with resistance testing, Mild residual left facial weakness with flattening of the nasolabial fold, Improved dysarthria, No sensory loss.   A stat CT angiogram of the head and neck with perfusion study were obtained. It showed a distal right M2 branch thrombus with perfusion deficits noted in the right parietal lobe.  Discussed her imaging findings and clinical symptoms with Dr. Elroy Channel for any role for interventional therapy at this point. Given that the patient's symptoms have significant improvement and they're mild at this time after the IV TPA,, and unstable cardiac situation with RVR and labile blood pressure, requiring amiodarone for RVR, I felt it was best not to intubate the patient for any interventional procedures at this time. Dr. Elroy Channel agreed with it.   patient's symptom continued to remain stable. She is admitted to the ICU for post-TPA monitoring and care.  Cardiology has been consulted while she was in the ER and they're managing her A. Fib with RVR.   stroke team will continue to follow starting tomorrow morning.   [name] is an 49 y.o. female who was eating with friends when she was noted to suddenly have a left facial droop, drool and lean to the left around 1030. EMS was called and code stroke was called. On arrival she has a right gaze deviation, left facial droop and left sided plegia. She is on Stollings but  states she has not taken this for 6 months.  BP was low with systolic at 80. Bolus of fluid given. tPA was given at 11:40.    SUBJECTIVE (INTERVAL HISTORY) No family at bedside. She is alert in NAD. She feels much better. Clinical exam continues to significantly improve.   OBJECTIVE Temp:  [97.5 F (36.4 C)-98.8  F (37.1 C)] 98 F (36.7 C) (02/18 0800) Pulse Rate:  [50-133] 94 (02/18 0800) Cardiac Rhythm:  [-]  Resp:  [15-39] 30 (02/18 0800) BP: (77-173)/(58-116) 125/91 mmHg (02/18 0800) SpO2:  [40 %-100 %] 96 % (02/18 0800) Weight:  [86 kg (189 lb 9.5 oz)] 86 kg (189 lb 9.5 oz) (02/17 1125)  CBC:  Recent Labs Lab 11/02/15 1114 11/02/15 1120  WBC 9.7  --   NEUTROABS 6.9  --   HGB 13.0 15.6*  HCT 41.3 46.0  MCV 82.9  --   PLT 225  --     Basic Metabolic Panel:  Recent Labs Lab 11/02/15 1114 11/02/15 1120  NA 141 141  K 3.3* 3.4*  CL 106 102  CO2 23  --   GLUCOSE 123* 120*  BUN 12 15  CREATININE 0.99 0.90  CALCIUM 9.2  --     Lipid Panel:    Component Value Date/Time   CHOL 131 11/03/2015 0405   TRIG 48 11/03/2015 0405   HDL 32* 11/03/2015 0405   CHOLHDL 4.1 11/03/2015 0405   VLDL 10 11/03/2015 0405   LDLCALC 89 11/03/2015 0405   HgbA1c:  Lab Results  Component Value Date   HGBA1C 5.90 03/22/2015   Urine Drug Screen:    Component Value Date/Time   LABOPIA NONE DETECTED 11/02/2015 1136   COCAINSCRNUR NONE DETECTED 11/02/2015 1136   LABBENZ NONE DETECTED 11/02/2015 1136   AMPHETMU NONE DETECTED 11/02/2015 1136   THCU NONE DETECTED 11/02/2015 1136   LABBARB NONE DETECTED 11/02/2015 1136      IMAGING  Ct Angio Head W/cm &/or Wo Cm 11/02/2015   1. Upper division distal right M2 cut off with more penumbra than infarct by CT perfusion.  2. Cervical carotid atherosclerosis without flow limiting stenosis.  3. 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    Ct Head Wo Contrast 11/02/2015   No acute intracranial abnormality.    Ct Angio Neck W/cm &/or Wo/cm 11/02/2015   1. Upper division distal right M2 cut off with more penumbra than infarct by CT perfusion.  2. Cervical carotid atherosclerosis without flow limiting stenosis.  3. 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     Ct Cerebral Perfusion W/cm 11/02/2015    1. Upper division distal right M2 cut off with more penumbra than infarct by CT perfusion.  2. Cervical carotid atherosclerosis without flow limiting stenosis.  3. 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     PHYSICAL EXAM  Physical exam: Exam: Gen: NAD                   CV: RRR, no MRG. No Carotid Bruits. No peripheral edema, warm, nontender Eyes: Conjunctivae clear without exudates or hemorrhage  Neuro: Detailed Neurologic Exam  Speech:    Speech is normal; fluent and spontaneous with normal comprehension.  Cognition:    The patient is oriented to person, place, and time;     recent and remote memory intact;     language fluent;     normal attention, concentration,     fund of knowledge Cranial Nerves:    The  pupils are equal, round, and reactive to light. The fundi are normal and spontaneous venous pulsations are present. Visual fields are full to finger confrontation. Extraocular movements are intact. Trigeminal sensation is intact and the muscles of mastication are normal. Mild left facial droop. The palate elevates in the midline. Hearing intact. Voice is normal. Shoulder shrug is normal. The tongue has normal motion without fasciculations.    Motor Observation:    No asymmetry, no atrophy, and no involuntary movements noted. Tone:    Normal muscle tone.    Posture:    Posture is normal. normal erect    Strength: Mild left-sided weakness otherwise strength is V/V in the upper and lower limbs.      Sensation: She endorses no sensory loss to LT and there is no neglect.      Reflex Exam:  DTR's:    Deep tendon reflexes in the upper and lower extremities are normal bilaterally.   Toes:    The toes are downgoing bilaterally.   Clonus:    Clonus is absent.        ASSESSMENT/PLAN [name] is a 50 y.o. female with history of hypertension, previous TIA, atrial fibrillation, and poor medical compliance presenting with left-sided  weakness with right gaze preference.  She received IV t-PA at 11:40.    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  Resultant  Mild left hemiparesis  MRI - pending  MRA - pending  Carotid Doppler - refer to CT angiogram of the neck  CTA head and neck and CT perfusion scan - see above - right M2 cut off  2D Echo - pending  LDL - 89  HgbA1c pending  VTE prophylaxis - SCDs  Diet Heart Room service appropriate?: Yes; Fluid consistency:: Thin  No antithrombotic prior to admission, now on No antithrombotic secondary to recent TPA therapy. She afib will consider starting NOAC after 24 hour imaging.  Patient counseled to be compliant with her antithrombotic medications  Ongoing aggressive stroke risk factor management  Therapy recommendations: Pending  Disposition: Pending  Hypertension  Blood pressure mildly low at times.  Permissive hypertension (OK if < 220/120) but gradually normalize in 5-7 days  Hyperlipidemia  Home meds: No lipid lowering medications prior to admission  LDL 89, goal < 70  Add low-dose Lipitor  Continue statin at discharge   Other Stroke Risk Factors  Cigarette smoker, quit smoking two years ago.  ETOH use  Obesity, Body mass index is 33.59 kg/(m^2).   Hx stroke/TIA  Atrial fibrillation  Other Active Problems  Hypotension  Medical noncompliance.  Mild hypokalemia  Atrial fibrillation with rapid ventricular response - cardiology following - IV amiodarone.  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 It is most common in African-American females.  Hospital day # 1  [name] PA-C [facility] Pager [phone] 11/03/2015, 9:37 AM    Personally examined patient and images, and have participated in and made any corrections needed to history,  physical, neuro exam,assessment and plan as stated above.  I have personally obtained the history, evaluated lab date, reviewed imaging studies and agree with radiology interpretations. Patient is s/p TPA after embolic infarct due to afib. She is improved, still within the 24 hour period with risk for hemorrhage and significant risk of neurological worsening, death and care requires constant monitoring of vital signs,  hemodynamics,respiratory and cardiac monitoring,review of multiple databases, neurological assessment, discussion with family, other specialists and medical decision making of high complexity.I  I spent 30 minutes of neurocritical care time in the care of this patient.  Sarina Ill, MD [facility] Pager: [phone]   To contact Stroke Continuity provider, please refer to [email or web address] After hours, contact General Neurology

## 2015-11-04 ENCOUNTER — Inpatient Hospital Stay (HOSPITAL_COMMUNITY): Payer: MEDICAID

## 2015-11-04 DIAGNOSIS — I639 Cerebral infarction, unspecified: Secondary | ICD-10-CM

## 2015-11-04 LAB — GLUCOSE, CAPILLARY
GLUCOSE-CAPILLARY: 96 mg/dL (ref 65–99)
GLUCOSE-CAPILLARY: 124 mg/dL — AB (ref 65–99)
Glucose-Capillary: 92 mg/dL (ref 65–99)
Glucose-Capillary: 162 mg/dL — ABNORMAL HIGH (ref 65–99)

## 2015-11-04 MED ORDER — DILTIAZEM HCL 60 MG PO TABS
60.0000 mg | ORAL_TABLET | Freq: Four times a day (QID) | ORAL | Status: DC
  Administered 2015-11-04 – 2015-11-05 (×4): 60 mg via ORAL
  Filled 2015-11-04 (×5): qty 1

## 2015-11-04 NOTE — Progress Notes (Signed)
    SUBJECTIVE:  Feels OK.  Reports no weakness   PHYSICAL EXAM Filed Vitals:   11/04/15 1000 11/04/15 1030 11/04/15 1100 11/04/15 1130  BP: 118/67 109/73 113/82 125/86  Pulse: 83 98 91 128  Temp:      TempSrc:      Resp: 26 22 30 24   Weight:      SpO2: 97% 97% 96% 98%   General:  No distress Lungs:  Clear Heart:  Irregular Abdomen:  Positive bowel sounds, no rebound no guarding Extremities:  Mild right arm edema  LABS:  Results for orders placed or performed during the hospital encounter of 11/02/15 (from the past 24 hour(s))  Magnesium     Status: None   Collection Time: 11/03/15  5:00 PM  Result Value Ref Range   Magnesium 1.8 1.7 - 2.4 mg/dL  Basic metabolic panel     Status: Abnormal   Collection Time: 11/03/15  5:00 PM  Result Value Ref Range   Sodium 142 135 - 145 mmol/L   Potassium 3.9 3.5 - 5.1 mmol/L   Chloride 107 101 - 111 mmol/L   CO2 22 22 - 32 mmol/L   Glucose, Bld 132 (H) 65 - 99 mg/dL   BUN 11 6 - 20 mg/dL   Creatinine, Ser 1.01 (H) 0.44 - 1.00 mg/dL   Calcium 8.9 8.9 - 10.3 mg/dL   GFR calc non Af Amer >60 >60 mL/min   GFR calc Af Amer >60 >60 mL/min   Anion gap 13 5 - 15  Glucose, capillary     Status: Abnormal   Collection Time: 11/03/15  5:19 PM  Result Value Ref Range   Glucose-Capillary 143 (H) 65 - 99 mg/dL  Glucose, capillary     Status: Abnormal   Collection Time: 11/03/15  9:53 PM  Result Value Ref Range   Glucose-Capillary 127 (H) 65 - 99 mg/dL  Glucose, capillary     Status: None   Collection Time: 11/04/15  8:07 AM  Result Value Ref Range   Glucose-Capillary 96 65 - 99 mg/dL    Intake/Output Summary (Last 24 hours) at 11/04/15 1247 Last data filed at 11/04/15 1200  Gross per 24 hour  Intake   1630 ml  Output   1196 ml  Net    434 ml    ECHO - Left ventricle: The cavity size was normal. Wall thickness was increased in a pattern of moderate LVH. Systolic function was normal. The estimated ejection fraction was in the  range of 60% to 65%. Wall motion was normal; there were no regional wall motion abnormalities. - Aortic valve: There was mild regurgitation. Valve area (VTI): 2.33 cm^2. Valve area (Vmax): 2.45 cm^2. - Mitral valve: There was mild regurgitation. - Left atrium: The atrium was severely dilated. - Right atrium: The atrium was severely dilated. - Atrial septum: No defect or patent foramen ovale was identified. - Pulmonary arteries: Systolic pressure was mildly increased. PA peak pressure: 35 mm Hg (S). - Technically adequate study.  ASSESSMENT AND PLAN:  CVA:  Start chronic anticoagulation when OK with neurology.  ATRIAL FIB:   On PO Cardizem.   BP is normal and HR is controlled.  Neuro apparently OK with BPs being at this level.  We will follow and convert to Cardizem CD before discharge.     Minus Breeding 11/04/2015 12:47 PM

## 2015-11-04 NOTE — Progress Notes (Addendum)
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 Separately she is sitting and having a meal with coworkers when the phone her in slumped to the left With left-sided. Time of onset was 10:30 AM. Patient has a known history of atrial fibrillation and was followed by cardiology. Known to have poor compliance with her medications as outpatient. She was on Xarelto previously for A. Fib but she self discontinued 6 months ago. A she reports that the reason for discontinuation as after she saw the TV attachments about xeralto causing bleeding problems. She denies any recent bleeding issues. No recent surgeries, head trauma, no history of prior strokes a recent MRI, No prior intracranial hemorrhage history. She only takes antihypertensive medicines, not on any antiplatelet or anticoagulant medications.  her initial neurologic examination when she presented to the ER Showed dense left hemi-neglect, With right left confusion, Moderate left hemiparesis that she had drift in left upper and lower extremities, And left facial weakness dysarthria.  she is also in A. Fib with rapid went for rate up to 140s and 150s while she was in the ER. Blood pressure recording in the monitor was inaccurate which showed systolics in Q000111Q to Q000111Q but diastolics about A999333. Manual blood pressure using a Doppler was obtained which confirmed hypertension with systolic blood pressures and 80s. She was immediately started on IV fluid boluses with normal saline 2 L which improved her blood pressure to A999333 to 0000000 systolic range. After confirming no contraindications , and that the last this is an option was greater than 6 months ago, Discussed the risk benefit of IV TPA which patient agreed to proceed with.   IV TPA was started in the ER at 11:40 AM , roughly about 1 hour 10  minutes after symptom onset. Within about 15 minutes after starting IV TPA, Patient was noted to have significant improvement of her left hemi-neglect. The left hemiparesis is also improved with no drift, And only had mild weakness with resistance testing, Mild residual left facial weakness with flattening of the nasolabial fold, Improved dysarthria, No sensory loss.   A stat CT angiogram of the head and neck with perfusion study were obtained. It showed a distal right M2 branch thrombus with perfusion deficits noted in the right parietal lobe.  Discussed her imaging findings and clinical symptoms with Dr. Elroy Channel for any role for interventional therapy at this point. Given that the patient's symptoms have significant improvement and they're mild at this time after the IV TPA,, and unstable cardiac situation with RVR and labile blood pressure, requiring amiodarone for RVR, I felt it was best not to intubate the patient for any interventional procedures at this time. Dr. Elroy Channel agreed with it.   patient's symptom continued to remain stable. She is admitted to the ICU for post-TPA monitoring and care.  Cardiology has been consulted while she was in the ER and they're managing her A. Fib with RVR.   stroke team will continue to follow starting tomorrow morning.   TACY BREDESEN is an 50 y.o. female who was eating with friends when she was noted to suddenly have a left facial droop, drool and lean to the left around 1030. EMS was called and code stroke was called. On arrival she has a right gaze deviation, left facial droop and left sided plegia. She is on [place] but  states she has not taken this for 6 months.  BP was low with systolic at 80. Bolus of fluid given. tPA was given at 11:40.    SUBJECTIVE (INTERVAL HISTORY) No family at bedside. She is alert in NAD. She feels much better. Clinical exam continues to significantly improve. Today exam appears normal. She has a PMHx of  afib with RVR. She self- discontinued her Xarelto and she has a hx of poor medical compliance.   OBJECTIVE Temp:  [97.7 F (36.5 C)-98.2 F (36.8 C)] 97.8 F (36.6 C) (02/19 0800) Pulse Rate:  [57-134] 57 (02/19 0830) Cardiac Rhythm:  [-]  Resp:  [15-43] 27 (02/19 0830) BP: (101-153)/(59-112) 115/78 mmHg (02/19 0830) SpO2:  [89 %-100 %] 97 % (02/19 0830)  CBC:   Recent Labs Lab 11/02/15 1114 11/02/15 1120  WBC 9.7  --   NEUTROABS 6.9  --   HGB 13.0 15.6*  HCT 41.3 46.0  MCV 82.9  --   PLT 225  --     Basic Metabolic Panel:   Recent Labs Lab 11/02/15 1114 11/02/15 1120 11/03/15 1700  NA 141 141 142  K 3.3* 3.4* 3.9  CL 106 102 107  CO2 23  --  22  GLUCOSE 123* 120* 132*  BUN 12 15 11   CREATININE 0.99 0.90 1.01*  CALCIUM 9.2  --  8.9  MG  --   --  1.8    Lipid Panel:     Component Value Date/Time   CHOL 131 11/03/2015 0405   TRIG 48 11/03/2015 0405   HDL 32* 11/03/2015 0405   CHOLHDL 4.1 11/03/2015 0405   VLDL 10 11/03/2015 0405   LDLCALC 89 11/03/2015 0405   HgbA1c:  Lab Results  Component Value Date   HGBA1C 5.90 03/22/2015   Urine Drug Screen:     Component Value Date/Time   LABOPIA NONE DETECTED 11/02/2015 1136   COCAINSCRNUR NONE DETECTED 11/02/2015 1136   LABBENZ NONE DETECTED 11/02/2015 1136   AMPHETMU NONE DETECTED 11/02/2015 1136   THCU NONE DETECTED 11/02/2015 1136   LABBARB NONE DETECTED 11/02/2015 1136      IMAGING  Ct Angio Head W/cm &/or Wo Cm 11/02/2015   1. Upper division distal right M2 cut off with more penumbra than infarct by CT perfusion.  2. Cervical carotid atherosclerosis without flow limiting stenosis.  3. 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    Ct Head Wo Contrast 11/02/2015   No acute intracranial abnormality.    Ct Angio Neck W/cm &/or Wo/cm 11/02/2015   1. Upper division distal right M2 cut off with more penumbra than infarct by CT perfusion.  2. Cervical carotid atherosclerosis  without flow limiting stenosis.  3. 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     Ct Cerebral Perfusion W/cm 11/02/2015   1. Upper division distal right M2 cut off with more penumbra than infarct by CT perfusion.  2. Cervical carotid atherosclerosis without flow limiting stenosis.  3. 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     PHYSICAL EXAM  Physical exam: Exam: Gen: NAD                   CV: irregular, no MRG. No Carotid Bruits. No peripheral edema, warm, nontender Eyes: Conjunctivae clear without exudates or hemorrhage  Neuro: Detailed Neurologic Exam  Speech:    Speech is normal; fluent and spontaneous with normal comprehension.  Cognition:    The patient is oriented to person, place, and time;  recent and remote memory intact;     language fluent;     normal attention, concentration,     fund of knowledge Cranial Nerves:    The pupils are equal, round, and reactive to light. The fundi are normal. Visual fields are full to finger confrontation. Extraocular movements are intact. Trigeminal sensation is intact and the muscles of mastication are normal. Mild left facial droop. The palate elevates in the midline. Hearing intact. Voice is normal. Shoulder shrug is normal. The tongue has normal motion without fasciculations.    Motor Observation:    No asymmetry, no atrophy, and no involuntary movements noted. Tone:    Normal muscle tone.    Posture:    Posture is normal. normal erect    Strength: strength is V/V in the upper and lower limbs. Yesterday had mild-left-sided weakness.      Sensation: She endorses no sensory loss to LT and there is no neglect.        ASSESSMENT/PLAN Ms. SHASTELYN PEMBLE is a 50 y.o. female with history of hypertension, previous TIA, atrial fibrillation, and poor medical compliance(stopped her Xarelto 6 months ago) presenting with left-sided weakness with right gaze preference.  She received IV  t-PA at 11:40.    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  Resultant  Resolved deficits  MRI - pending  MRA - pending  Carotid Doppler - refer to CT angiogram of the neck  CTA head and neck and CT perfusion scan - see above - right M2 cut off  2D Echo - EF 60-65%. No cardiac source of emboli identified.  LDL - 89  HgbA1c pending  VTE prophylaxis - SCDs Diet Carb Modified Fluid consistency:: Thin; Room service appropriate?: Yes  No antithrombotic prior to admission, now onASA 325mg  secondary to recent TPA therapy. After MRi resultsd will consider starting NOAC after 24 hour imaging.  Patient counseled to be compliant with her antithrombotic medications  Ongoing aggressive stroke risk factor management  Therapy recommendations: Pending  Disposition: Pending  Hypertension  Blood pressure mildly low at times. Improving with fluids.  Permissive hypertension (OK if < 220/120) but gradually normalize in 5-7 days Blood pressures have been running low, patient still improving clinically. Blood pressure goals above A999333 systolic at a minimum. Will increase IV fluids today. No Hx of CHF and EF normal on echo. Per cardiology: Rate control <110 is reasonable target.   Hyperlipidemia  Home meds: No lipid lowering medications prior to admission  LDL 89, goal < 70  Lipitor 20 mg daily added  Continue statin at discharge   Other Stroke Risk Factors  Cigarette smoker, quit smoking two years ago.  ETOH use  Obesity, Body mass index is 33.59 kg/(m^2).   Hx stroke/TIA  Atrial fibrillation  Other Active Problems  Hypotension  Long history Medical noncompliance.  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 It is most common in African-American females)  Nonsustained V. Tach - asymptomatic - potassium supplemented - cardiology aware  Afib with RVR  Discussed with cardiology, with start cardizem PO short acting for rate control. Adjust as needed for rate control. Per cardiology: Rate control <110 is reasonable target.  Blood pressure has been running low, maintain minimum A999333 systolic.  Will restart Xarelto. [facility]  recs.     PLAN  Transfer out of unit today.  Addendum: Mri shows area of hemorrhagic transformation in the area of the infarct, will hold on Xarelto at this time.    Hospital day # 2  [name] PA-C [facility] Pager [phone] 11/04/2015, 9:14 AM    Personally examined patient and images, and have participated in and made any corrections needed to history, physical, neuro exam,assessment and plan as stated above.  I have personally obtained the history, evaluated lab date, reviewed imaging studies and agree with radiology interpretations. Patient is s/p TPA after embolic infarct due to afib. She stopped Xarelto and has been non-compliant with her medications. Restart Xarelto.  EKG with afib, rvr and trying to control rate, cardiology on board, cardizem PO  She is much improved clincally without deficits this morning,    Sarina Ill, MD [facility] Pager: [phone]   To contact Stroke Continuity provider, please refer to [email or web address] After hours, contact General Neurology

## 2015-11-04 NOTE — Evaluation (Signed)
Physical Therapy Evaluation Patient Details Name: [name] MRN: [id number] DOB: 1966-05-05 Today's Date: 11/04/2015   History of Present Illness  Pt is a 50 yo female who was found slumped to the left at work. Pt give tPA in ED in which her L hemiparesis has since resolved. MRI revealed acute infarct in R posterior temporal and parietal lobe.  Clinical Impression  Pt admitted with above. All pt's symptoms appear to have resolved. Pt tested strong on L sided and denies any numbness/tingling. Pt scored 23/23 on DGI and is at minimal falls risk. Pt with no further acute PT needs at this time as she is functioning at mod I. Pt safe to d/c home once medically stable.    Follow Up Recommendations No PT follow up;Supervision - Intermittent    Equipment Recommendations  None recommended by PT    Recommendations for Other Services       Precautions / Restrictions Precautions Precautions: None Restrictions Weight Bearing Restrictions: No      Mobility  Bed Mobility Overal bed mobility: Modified Independent             General bed mobility comments: no difficulty  Transfers Overall transfer level: Modified independent Equipment used: None             General transfer comment: pt with no instability or dizziness, good technique  Ambulation/Gait Ambulation/Gait assistance: Modified independent (Device/Increase time) Ambulation Distance (Feet): 200 Feet Assistive device: None Gait Pattern/deviations: Step-through pattern Gait velocity: wfl Gait velocity interpretation: at or above normal speed for age/gender General Gait Details: pt at first guarded but then transitioned back to her normal walking speed and gait pattern. no episodes of LOB and no instability  Stairs Stairs: Yes Stairs assistance: Supervision Stair Management: No rails Number of Stairs: 3 General stair comments: step to gait pattern  Wheelchair Mobility    Modified Rankin (Stroke Patients  Only)       Balance Overall balance assessment: No apparent balance deficits (not formally assessed)                               Standardized Balance Assessment Standardized Balance Assessment : Dynamic Gait Index   Dynamic Gait Index Level Surface: Normal Change in Gait Speed: Normal Gait with Horizontal Head Turns: Normal Gait with Vertical Head Turns: Normal Gait and Pivot Turn: Normal Step Over Obstacle: Normal Step Around Obstacles: Normal Steps: Normal Total Score: 24       Pertinent Vitals/Pain Pain Assessment: No/denies pain    Home Living Family/patient expects to be discharged to:: Private residence Living Arrangements:  (daughter and granddaughter) Available Help at Discharge: Family;Available PRN/intermittently (dtr works) Type of Home: House Home Access: Stairs to enter Entrance Stairs-Rails: Building surveyor of Steps: 5 Home Layout: One level Home Equipment: None      Prior Function Level of Independence: Independent         Comments: works 2 jobs     Journalist, newspaper   Dominant Hand: Right    Extremity/Trunk Assessment   Upper Extremity Assessment: Overall WFL for tasks assessed           Lower Extremity Assessment: Overall WFL for tasks assessed      Cervical / Trunk Assessment: Normal  Communication   Communication: No difficulties  Cognition Arousal/Alertness: Awake/alert Behavior During Therapy: WFL for tasks assessed/performed Overall Cognitive Status: Within Functional Limits for tasks assessed  General Comments      Exercises        Assessment/Plan    PT Assessment Patent does not need any further PT services  PT Diagnosis Generalized weakness   PT Problem List    PT Treatment Interventions     PT Goals (Current goals can be found in the Care Plan section) Acute Rehab PT Goals Patient Stated Goal: home asap PT Goal Formulation: All assessment and  education complete, DC therapy    Frequency     Barriers to discharge        Co-evaluation               End of Session Equipment Utilized During Treatment: Gait belt Activity Tolerance: Patient tolerated treatment well Patient left: in chair;with call bell/phone within reach (leaving with transport for test) Nurse Communication: Mobility status         Time: 1110-1130 PT Time Calculation (min) (ACUTE ONLY): 20 min   Charges:   PT Evaluation $PT Eval Low Complexity: 1 Procedure     PT G CodesKingsley Callander 11/04/2015, 2:11 PM   Kittie Plater, PT, DPT Pager #: [phone] Office #: [phone]

## 2015-11-05 DIAGNOSIS — Z8673 Personal history of transient ischemic attack (TIA), and cerebral infarction without residual deficits: Secondary | ICD-10-CM | POA: Insufficient documentation

## 2015-11-05 DIAGNOSIS — I4891 Unspecified atrial fibrillation: Secondary | ICD-10-CM | POA: Diagnosis present

## 2015-11-05 LAB — BASIC METABOLIC PANEL
ANION GAP: 9 (ref 5–15)
BUN: 10 mg/dL (ref 6–20)
CHLORIDE: 109 mmol/L (ref 101–111)
CO2: 23 mmol/L (ref 22–32)
Calcium: 8.7 mg/dL — ABNORMAL LOW (ref 8.9–10.3)
Creatinine, Ser: 0.85 mg/dL (ref 0.44–1.00)
Glucose, Bld: 96 mg/dL (ref 65–99)
POTASSIUM: 3.9 mmol/L (ref 3.5–5.1)
SODIUM: 141 mmol/L (ref 135–145)

## 2015-11-05 LAB — HEMOGLOBIN A1C
HEMOGLOBIN A1C: 6.5 % — AB (ref 4.8–5.6)
MEAN PLASMA GLUCOSE: 140 mg/dL

## 2015-11-05 LAB — GLUCOSE, CAPILLARY
GLUCOSE-CAPILLARY: 95 mg/dL (ref 65–99)
GLUCOSE-CAPILLARY: 97 mg/dL (ref 65–99)
Glucose-Capillary: 107 mg/dL — ABNORMAL HIGH (ref 65–99)
Glucose-Capillary: 102 mg/dL — ABNORMAL HIGH (ref 65–99)

## 2015-11-05 MED ORDER — DILTIAZEM HCL 60 MG PO TABS
90.0000 mg | ORAL_TABLET | Freq: Four times a day (QID) | ORAL | Status: DC
  Administered 2015-11-05 – 2015-11-06 (×3): 90 mg via ORAL
  Filled 2015-11-05 (×3): qty 1

## 2015-11-05 MED ORDER — DILTIAZEM HCL 60 MG PO TABS: 90.0000 mg | ORAL_TABLET | Freq: Four times a day (QID) | ORAL | Status: DC

## 2015-11-05 MED ORDER — DILTIAZEM HCL 60 MG PO TABS
90.0000 mg | ORAL_TABLET | Freq: Four times a day (QID) | ORAL | Status: DC
  Administered 2015-11-05: 90 mg via ORAL
  Filled 2015-11-05: qty 1

## 2015-11-05 NOTE — Evaluation (Signed)
Speech Language Pathology Evaluation Patient Details Name: [name] MRN: [id number] DOB: [birth year] Today's Date: 11/05/2015 Time: [id number] SLP Time Calculation (min) (ACUTE ONLY): 21 min  Problem List:  Patient Active Problem List   Diagnosis Date Noted  . CVA (cerebral infarction) 11/02/2015  . Claudication of both lower extremities ([place]) 03/22/2015  . Paroxysmal atrial flutter ([place])   . Atrial flutter ([place]) 04/12/2014  . Anticoagulation adequate, Xarelto started 04/12/2014  . Pap smear for cervical cancer screening 03/28/2014  . Leucocytosis 03/28/2014  . Hypertension, essential, benign 03/28/2014  . HCAP (healthcare-associated pneumonia) 03/22/2014  . Ex-smoker 06/15/2013  . Ventral hernia 06/15/2013  . Abdominal pain, unspecified site 06/15/2013  . Atrial flutter with rapid ventricular response, in and out at discharge- chronic 02/15/2013  . Hx-TIA (transient ischemic attack) 02/05/13 02/15/2013  . HTN (hypertension) 02/15/2013  . TIA (transient ischemic attack) 02/05/2013  . Chest pain 02/05/2013  . Hypokalemia 02/05/2013  . Leukocytosis 02/05/2013  . Acute respiratory failure ([place]) 09/06/2011  . CAP (community acquired pneumonia) 09/06/2011  . Tachycardia 09/06/2011   Past Medical History:  Past Medical History  Diagnosis Date  . Paroxysmal a-fib ([place])   . Shortness of breath   . Hypertension   . Transient ischemic attack (TIA) 01/2013  . Encounter for long-term (current) use of other medications   . Heart murmur   . Pneumonia 02/2011; 08/2011; 03/22/2014   Past Surgical History:  Past Surgical History  Procedure Laterality Date  . Tee without cardioversion N/A 02/18/2013    Procedure: TRANSESOPHAGEAL ECHOCARDIOGRAM (TEE);  Surgeon: Sanda Klein, MD;  Location: [facility];  Service: Cardiovascular;  Laterality: N/A;  . Cardioversion N/A 02/18/2013    Procedure: CARDIOVERSION;  Surgeon: Sanda Klein, MD;  Location: [facility];  Service: Cardiovascular;   Laterality: N/A;  . Cardioversion N/A 02/23/2013    Procedure: CARDIOVERSION;  Surgeon: Leonie Man, MD;  Location: [place];  Service: Cardiovascular;  Laterality: N/A;  BESIDE CV  . Tubal ligation  1999   HPI:  50 year old female admitted with acute embolic right posterior temporal and parietal lobe CVA, given tPA. History of TIA.    Assessment / Plan / Recommendation Clinical Impression  Cognitive-linguistic evaluation complete. Patient scoring WFL in all areas on the Cognistat.  Only residual deficit in subtle left sided facial assymetry which is not impacting communication function. Given rapid nature of recovery thus far, suspect continued spontaneous recovery. If does not improve and patient wishes, OP SLP services for oral motor exercises could be beneficial.     SLP Assessment  Patient does not need any further Speech Lanaguage Pathology Services    Follow Up Recommendations  None          SLP Evaluation Prior Functioning  Cognitive/Linguistic Baseline: Within functional limits Type of Home: House Available Help at Discharge: Family;Available PRN/intermittently   Cognition  Overall Cognitive Status: Within Functional Limits for tasks assessed Orientation Level: Oriented X4    Comprehension  Auditory Comprehension Overall Auditory Comprehension: Appears within functional limits for tasks assessed Visual Recognition/Discrimination Discrimination: Within Function Limits Reading Comprehension Reading Status: Within funtional limits    Expression Expression Primary Mode of Expression: Verbal Verbal Expression Overall Verbal Expression: Appears within functional limits for tasks assessed Written Expression Dominant Hand: Right   Oral / Motor  Oral Motor/Sensory Function Overall Oral Motor/Sensory Function: Mild impairment Facial ROM: Reduced left;Suspected CN VII (facial) dysfunction Facial Symmetry: Abnormal symmetry left;Suspected CN VII (facial)  dysfunction Facial Strength: Within Functional Limits Facial  Sensation: Within Functional Limits Lingual ROM: Within Functional Limits Lingual Symmetry: Within Functional Limits Lingual Strength: Within Functional Limits Lingual Sensation: Within Functional Limits Velum: Within Functional Limits Mandible: Within Functional Limits Motor Speech Overall Motor Speech: Appears within functional limits for tasks assessed   Rome Hartwell, [place] [phone]         Gabriel Rainwater Meryl 11/05/2015, 11:31 AM

## 2015-11-05 NOTE — Progress Notes (Signed)
Occupational Therapy Evaluation Patient Details Name: [name] MRN: [id number] DOB: 21-Dec-1965 Today's Date: 11/05/2015    History of Present Illness Pt is a 50 yo female who was found slumped to the left at work. Pt give tPA in ED in which her L hemiparesis has since resolved. MRI revealed acute infarct in R posterior temporal and parietal lobe.   Clinical Impression   PTA, pt independent with ADL and mobility and worked at [employer] and as a [place]. Pt appears close to baseline level of functioning. Discussed returning to work when released by MD and recommended for pt to have direct supervision with all tasks to increase safe resumption of job duties. Pt verbalized understanding. Educated pt on warning signs/symptoms of CVA.  Pt safe to D/C home with intermittent S when medically stable.     Follow Up Recommendations  No OT follow up;Supervision - Intermittent    Equipment Recommendations  None recommended by OT    Recommendations for Other Services       Precautions / Restrictions Precautions Precautions: None Restrictions Weight Bearing Restrictions: No      Mobility Bed Mobility Overal bed mobility: Modified Independent             General bed mobility comments: no difficulty  Transfers Overall transfer level: Independent Equipment used: None                  Balance Overall balance assessment: No apparent balance deficits (not formally assessed)                                          ADL Overall ADL's : At baseline                                             Vision Vision Assessment?: Yes Eye Alignment: Within Functional Limits Ocular Range of Motion: Within Functional Limits Alignment/Gaze Preference: Within Defined Limits Tracking/Visual Pursuits: Able to track stimulus in all quads without difficulty Saccades: Within functional limits Convergence: Within functional limits Visual Fields: No  apparent deficits   Agricultural engineer Tested?: Yes Comments: appears intact   Praxis Praxis Praxis tested?: Within functional limits    Pertinent Vitals/Pain Pain Assessment: No/denies pain     Hand Dominance Right   Extremity/Trunk Assessment Upper Extremity Assessment Upper Extremity Assessment: Overall WFL for tasks assessed   Lower Extremity Assessment Lower Extremity Assessment: Overall WFL for tasks assessed   Cervical / Trunk Assessment Cervical / Trunk Assessment: Normal   Communication Communication Communication: No difficulties   Cognition Arousal/Alertness: Awake/alert Behavior During Therapy: WFL for tasks assessed/performed Overall Cognitive Status: Within Functional Limits for tasks assessed                     General Comments       Exercises       Shoulder Instructions      Home Living Family/patient expects to be discharged to:: Private residence Living Arrangements: Children (daughter and granddaughter) Available Help at Discharge: Family;Available PRN/intermittently (dtr works) Type of Home: House Home Access: Stairs to enter [employer] of Steps: 5 Entrance Stairs-Rails: Right Home Layout: One level     Bathroom Shower/Tub: Teacher, early years/pre: Standard  Home Equipment: None          Prior Functioning/Environment Level of Independence: Independent        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    OT Diagnosis: Generalized weakness   OT Problem List: Decreased safety awareness   OT Treatment/Interventions:      OT Goals(Current goals can be found in the care plan section) Acute Rehab OT Goals Patient Stated Goal: home asap OT Goal Formulation: All assessment and education complete, DC therapy  OT Frequency:     Barriers to D/C:            Co-evaluation              End of Session Nurse Communication: Mobility  status  Activity Tolerance: Patient tolerated treatment well Patient left: in bed;with call bell/phone within reach   Time: 1053-1115 OT Time Calculation (min): 22 min Charges:  OT General Charges $OT Visit: 1 Procedure OT Evaluation $OT Eval Moderate Complexity: 1 Procedure G-Codes:    Aleeta Schmaltz,HILLARY November 17, 2015, 11:43 AM   Maurie Boettcher, OTR/L  [phone] 17-Nov-2015

## 2015-11-05 NOTE — Progress Notes (Signed)
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 Separately she is sitting and having a meal with coworkers when the phone her in slumped to the left With left-sided. Time of onset was 10:30 AM. Patient has a known history of atrial fibrillation and was followed by cardiology. Known to have poor compliance with her medications as outpatient. She was on Xarelto previously for A. Fib but she self discontinued 6 months ago. A she reports that the reason for discontinuation as after she saw the TV attachments about xeralto causing bleeding problems. She denies any recent bleeding issues. No recent surgeries, head trauma, no history of prior strokes a recent MRI, No prior intracranial hemorrhage history. She only takes antihypertensive medicines, not on any antiplatelet or anticoagulant medications.  her initial neurologic examination when she presented to the ER Showed dense left hemi-neglect, With right left confusion, Moderate left hemiparesis that she had drift in left upper and lower extremities, And left facial weakness dysarthria.  she is also in A. Fib with rapid went for rate up to 140s and 150s while she was in the ER. Blood pressure recording in the monitor was inaccurate which showed systolics in Q000111Q to Q000111Q but diastolics about A999333. Manual blood pressure using a Doppler was obtained which confirmed hypertension with systolic blood pressures and 80s. She was immediately started on IV fluid boluses with normal saline 2 L which improved her blood pressure to A999333 to 0000000 systolic range. After confirming no contraindications , and that the last this is an option was greater than 6 months ago, Discussed the risk benefit of IV TPA which patient agreed to proceed with.   IV TPA was started in the ER at 11:40 AM , roughly about 1 hour 10  minutes after symptom onset. Within about 15 minutes after starting IV TPA, Patient was noted to have significant improvement of her left hemi-neglect. The left hemiparesis is also improved with no drift, And only had mild weakness with resistance testing, Mild residual left facial weakness with flattening of the nasolabial fold, Improved dysarthria, No sensory loss.   A stat CT angiogram of the head and neck with perfusion study were obtained. It showed a distal right M2 branch thrombus with perfusion deficits noted in the right parietal lobe.  Discussed her imaging findings and clinical symptoms with Dr. Elroy Channel for any role for interventional therapy at this point. Given that the patient's symptoms have significant improvement and they're mild at this time after the IV TPA,, and unstable cardiac situation with RVR and labile blood pressure, requiring amiodarone for RVR, I felt it was best not to intubate the patient for any interventional procedures at this time. Dr. Elroy Channel agreed with it.   patient's symptom continued to remain stable. She is admitted to the ICU for post-TPA monitoring and care.  Cardiology has been consulted while she was in the ER and they're managing her A. Fib with RVR.   stroke team will continue to follow starting tomorrow morning.   [name] is an 50 y.o. female who was eating with friends when she was noted to suddenly have a left facial droop, drool and lean to the left around 1030. EMS was called and code stroke was called. On arrival she has a right gaze deviation, left facial droop and left sided plegia. She is on [place] but  states she has not taken this for 6 months.  BP was low with systolic at 80. Bolus of fluid given. tPA was given at 11:40.    SUBJECTIVE (INTERVAL HISTORY) She remains neurologically stable. I had a long discussion with patient about compliance with Xarelto. She states she had stopped it because she had heard some  negative marketing commercials about the drug and bleeding on television. After counseling she is now willing to restart the medication. I also discussed with her alternatives including other direct acting anticoagulants as alternatives to warfarin   OBJECTIVE Temp:  [98 F (36.7 C)-98.5 F (36.9 C)] 98.3 F (36.8 C) (02/20 1349) Pulse Rate:  [66-120] 120 (02/20 1349) Cardiac Rhythm:  [-] Atrial fibrillation (02/20 0704) Resp:  [16-31] 18 (02/20 1349) BP: (104-139)/(65-101) 131/86 mmHg (02/20 1349) SpO2:  [97 %-100 %] 98 % (02/20 1349)  CBC:   Recent Labs Lab 11/02/15 1114 11/02/15 1120  WBC 9.7  --   NEUTROABS 6.9  --   HGB 13.0 15.6*  HCT 41.3 46.0  MCV 82.9  --   PLT 225  --     Basic Metabolic Panel:   Recent Labs Lab 11/03/15 1700 11/05/15 0631  NA 142 141  K 3.9 3.9  CL 107 109  CO2 22 23  GLUCOSE 132* 96  BUN 11 10  CREATININE 1.01* 0.85  CALCIUM 8.9 8.7*  MG 1.8  --    Lipid Panel:     Component Value Date/Time   CHOL 131 11/03/2015 0405   TRIG 48 11/03/2015 0405   HDL 32* 11/03/2015 0405   CHOLHDL 4.1 11/03/2015 0405   VLDL 10 11/03/2015 0405   LDLCALC 89 11/03/2015 0405   HgbA1c:  Lab Results  Component Value Date   HGBA1C 6.5* 11/03/2015   Urine Drug Screen:     Component Value Date/Time   LABOPIA NONE DETECTED 11/02/2015 1136   COCAINSCRNUR NONE DETECTED 11/02/2015 1136   LABBENZ NONE DETECTED 11/02/2015 1136   AMPHETMU NONE DETECTED 11/02/2015 1136   THCU NONE DETECTED 11/02/2015 1136   LABBARB NONE DETECTED 11/02/2015 1136     IMAGING  Ct Angio Head W/cm &/or Wo Cm 11/02/2015   1. Upper division distal right M2 cut off with more penumbra than infarct by CT perfusion.  2. Cervical carotid atherosclerosis without flow limiting stenosis.  3. 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   Ct Head Wo Contrast 11/02/2015   No acute intracranial abnormality.   Ct Angio Neck W/cm &/or Wo/cm 11/02/2015   1. Upper  division distal right M2 cut off with more penumbra than infarct by CT perfusion.  2. Cervical carotid atherosclerosis without flow limiting stenosis.  3. 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   Ct Cerebral Perfusion W/cm 11/02/2015   1. Upper division distal right M2 cut off with more penumbra than infarct by CT perfusion.  2. Cervical carotid atherosclerosis without flow limiting stenosis.  3. 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  Ct Head Wo Contrast 11/03/2015   Development of acute infarct in the right superior temporal lobe without hemorrhage.  Mr Brain Wo Contrast 11/04/2015  Acute infarct right temporoparietal lobe with a small amount of hemorrhage. Hemorrhage not visualized on CT scan yesterday. Moderate chronic microvascular ischemia     PHYSICAL EXAM Exam: Gen: NAD                   CV: irregular, no MRG. No Carotid Bruits. No peripheral edema, warm, nontender  Eyes: Conjunctivae clear without exudates or hemorrhage Neuro: Detailed Neurologic Exam Speech:    Speech is normal; fluent and spontaneous with normal comprehension.  Cognition:    The patient is oriented to person, place, and time;     recent and remote memory intact;     language fluent;     normal attention, concentration,     fund of knowledge Cranial Nerves:    The pupils are equal, round, and reactive to light. The fundi are normal. Visual fields are full to finger confrontation. Extraocular movements are intact. Trigeminal sensation is intact and the muscles of mastication are normal. Mild left facial droop. The palate elevates in the midline. Hearing intact. Voice is normal. Shoulder shrug is normal. The tongue has normal motion without fasciculations.  Motor Observation:    No asymmetry, no atrophy, and no involuntary movements noted. Tone:    Normal muscle tone.   Posture:    Posture is normal. normal erect Strength: strength is V/V in the upper and  lower limbs. Yesterday had mild-left-sided weakness.  Sensation: She endorses no sensory loss to LT and there is no neglect.    ASSESSMENT/PLAN [name] is a 50 y.o. female with history of hypertension, previous TIA, atrial fibrillation, and poor medical compliance(stopped her Xarelto 6 months ago) presenting with left-sided weakness with right gaze preference.  She received IV t-PA at 11:40.    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  Resultant  Resolved deficits  MRI - pending  MRA - pending  Carotid Doppler - refer to CT angiogram of the neck  CTA head and neck and CT perfusion scan - see above - right M2 cut off  2D Echo - EF 60-65%. No cardiac source of emboli identified.  LDL - 89  HgbA1c pending  VTE prophylaxis - SCDs Diet Carb Modified Fluid consistency:: Thin; Room service appropriate?: Yes  No antithrombotic prior to admission, now onASA 325mg  secondary to recent TPA therapy. After MRi resultsd will consider starting NOAC after 24 hour imaging.  Patient counseled to be compliant with her antithrombotic medications  Ongoing aggressive stroke risk factor management  Therapy recommendations: Pending  Disposition: Pending  Hypertension  Blood pressure mildly low at times. Improving with fluids.  Permissive hypertension (OK if < 220/120) but gradually normalize in 5-7 days Blood pressures have been running low, patient still improving clinically. Blood pressure goals above A999333 systolic at a minimum. Will increase IV fluids today. No Hx of CHF and EF normal on echo. Per cardiology: Rate control <110 is reasonable target.   Hyperlipidemia  Home meds: No lipid lowering medications prior to admission  LDL 89, goal < 70  Lipitor 20 mg daily added  Continue statin at discharge  Other Stroke Risk Factors  Cigarette smoker, quit smoking two years ago.  ETOH use  Obesity, Body mass index is 33.59 kg/(m^2).   Hx  stroke/TIA  Atrial fibrillation  Other Active Problems  Hypotension  Long history Medical noncompliance.  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 It is most common in African-American females)  Nonsustained V. Tach - asymptomatic - potassium supplemented - cardiology aware  Afib with RVR  Discussed with cardiology, with start cardizem PO short acting for rate control. Adjust as needed for  rate control. Per cardiology: Rate control <110 is reasonable target.  Blood pressure has been running low, maintain minimum A999333 systolic.  Will restart Xarelto. Appreciate Cardiology recs.    PLAN  Addendum: [name] shows area of hemorrhagic transformation in the area of the infarct, will hold on Xarelto at this time.    Hospital day # 3       I have personally examined this patient, reviewed notes, independently viewed imaging studies, participated in medical decision making and plan of care. I have made any additions or clarifications directly to the above note.  Antony Contras, MD Medical Director [facility] Pager: [phone] 11/05/2015 7:14 PM   To contact Stroke Continuity provider, please refer to [email or web address] After hours, contact General Neurology

## 2015-11-05 NOTE — Progress Notes (Signed)
Patient Name: [name] Date of Encounter: 11/05/2015   SUBJECTIVE  Feeling well. No chest pain, sob or palpitations. No complains.   CURRENT MEDS .  stroke: mapping our early stages of recovery book   Does not apply Once  . aspirin EC  325 mg Oral Daily  . atorvastatin  20 mg Oral q1800  . diltiazem  60 mg Oral 4 times per day  . insulin aspart  0-15 Units Subcutaneous TID WC  . pantoprazole  40 mg Oral QHS    OBJECTIVE  Filed Vitals:   11/04/15 1730 11/04/15 1800 11/04/15 2126 11/05/15 0609  BP: 115/80 128/101 130/96 139/94  Pulse: 103 106 94 66  Temp:   98.5 F (36.9 C) 98 F (36.7 C)  TempSrc:   Oral Oral  Resp: 31 20 16 16   Weight:      SpO2: 98% 99% 100% 100%    Intake/Output Summary (Last 24 hours) at 11/05/15 0955 Last data filed at 11/05/15 0600  Gross per 24 hour  Intake   2005 ml  Output    300 ml  Net   1705 ml   Filed Weights   11/02/15 1125  Weight: 189 lb 9.5 oz (86 kg)    PHYSICAL EXAM  General: Pleasant, NAD. Neuro: Alert and oriented X 3. Moves all extremities spontaneously. Psych: Normal affect. HEENT:  Normal  Neck: Supple without bruits or JVD. Lungs:  Resp regular and unlabored, CTA. Heart: IR IR  no s3, s4, or murmurs. Abdomen: Soft, non-tender, non-distended, BS + x 4.  Extremities: No clubbing, cyanosis or edema. DP/PT/Radials 2+ and equal bilaterally.  Accessory Clinical Findings  CBC  Recent Labs  11/02/15 1114 11/02/15 1120  WBC 9.7  --   NEUTROABS 6.9  --   HGB 13.0 15.6*  HCT 41.3 46.0  MCV 82.9  --   PLT 225  --    Basic Metabolic Panel  Recent Labs  11/03/15 1700 11/05/15 0631  NA 142 141  K 3.9 3.9  CL 107 109  CO2 22 23  GLUCOSE 132* 96  BUN 11 10  CREATININE 1.01* 0.85  CALCIUM 8.9 8.7*  MG 1.8  --    Liver Function Tests  Recent Labs  11/02/15 1114  AST 47*  ALT 54  ALKPHOS 85  BILITOT 1.1  PROT 7.3  ALBUMIN 3.4*   Fasting Lipid Panel  Recent Labs  11/03/15 0405  CHOL 131   HDL 32*  LDLCALC 89  TRIG 48  CHOLHDL 4.1    TELE  afib with rate mostly in 90s to low 100s. Intermittently goes to 120s  Radiology/Studies  Ct Angio Head W/cm &/or Wo Cm  11/02/2015  CLINICAL DATA:  Right-sided weakness. EXAM: CT ANGIOGRAPHY HEAD AND NECK TECHNIQUE: Multidetector CT imaging of the head and neck was performed using the standard protocol during bolus administration of intravenous contrast. Multiplanar CT image reconstructions and MIPs were obtained to evaluate the vascular anatomy. Carotid stenosis measurements (when applicable) are obtained utilizing NASCET criteria, using the distal internal carotid diameter as the denominator. CT perfusion was performed through the cerebral hemispheres after bolus administration of IV contrast. CONTRAST:  DOSE CURRENTLY NOT AVAILABLE.  REFERENCE EMR COMPARISON:  None. FINDINGS: These results were called by telephone at the time of interpretation on 11/02/2015 at 12:32 pm to Dr. Silverio Decamp, who verbally acknowledged these results. CTA NECK Aortic arch: No visualized aneurysm or dissection flap. Four vessel branching. Low-density appearing fluid around the aortic arch attributed  to pericardial fluid in the upper recesses. Right carotid system: Mainly noncalcified plaque at the bifurcation with positive remodeling. No flow limiting stenosis. No ulcerated plaque or dissection. Left carotid system: Predominantly noncalcified plaque at the bifurcation with approximately 30% stenosis. No dissection flap or ulcerated plaque. On coronal reformats ridge like irregularity along the medial aspect of the proximal ICA is likely kink. No convincing fibromuscular dysplasia or web. Vertebral arteries:Left vertebral artery originates from the arch. Right dominance. No flow limiting stenosis or dissection. Skeleton: No contributory finding. There is a sclerotic 12 mm lesion with low-density halo left the midline in the anterior mandible. Other similar-appearing foci seen  in the bilateral mandibular bodies, 6 mm on right. These are close to vital teeth apices. Other neck: No incidental mass or adenopathy. CTA HEAD Anterior circulation: Symmetric carotid artery size. Intact circle-of-Willis. Atherosclerotic calcification of the carotid siphons. Upper division distal M2 cut off on the right. No flow limiting proximal stenosis.  No aneurysm. Posterior circulation: Symmetric vertebral arteries. Symmetric vertebrobasilar branching. Diminutive P1 segments in the setting of large posterior communicating arteries. No vessel cut off. No aneurysm. Venous sinuses: Patent as permitted by contrast timing. Anatomic variants: Intact circle-of-Willis CEREBRAL PERFUSION: Arterial and venous input its are appropriate. No confounding motion. Throughout the posterior upper division right MCA territory there is prolonged mean transit time, predominately with preserved cerebral blood flow, consistent with penumbra. Along the deep margin of the ischemic area there are patchy areas of probable completed infarct on subtraction maps. IMPRESSION: 1. Upper division distal right M2 cut off with more penumbra than infarct by CT perfusion. 2. Cervical carotid atherosclerosis without flow limiting stenosis. 3. 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 Electronically Signed   By: Monte Fantasia M.D.   On: 11/02/2015 12:46   Ct Head Wo Contrast  11/03/2015  CLINICAL DATA:  Stroke EXAM: CT HEAD WITHOUT CONTRAST TECHNIQUE: Contiguous axial images were obtained from the base of the skull through the vertex without intravenous contrast. COMPARISON:  CT perfusion 11/02/2015 FINDINGS: 15 mm area of hypodensity in the right superior temporal lobe compatible with acute infarct. This area showed increased mean transit time on the CT perfusion . No associated hemorrhage. No shift of the midline structures. Ventricle size is normal.  No other areas of acute infarct. Calvarium intact. IMPRESSION:  Development of acute infarct in the right superior temporal lobe without hemorrhage. Electronically Signed   By: Franchot Gallo M.D.   On: 11/03/2015 15:25   Ct Head Wo Contrast  11/02/2015  CLINICAL DATA:  Code stroke.  Right side weakness. EXAM: CT HEAD WITHOUT CONTRAST TECHNIQUE: Contiguous axial images were obtained from the base of the skull through the vertex without intravenous contrast. COMPARISON:  02/05/2013 FINDINGS: No acute intracranial abnormality. Specifically, no hemorrhage, hydrocephalus, mass lesion, acute infarction, or significant intracranial injury. No acute calvarial abnormality. Visualized paranasal sinuses and mastoids clear. Orbital soft tissues unremarkable. IMPRESSION: No acute intracranial abnormality. Critical Value/emergent results were called by telephone at the time of interpretation on 11/02/2015 at 11:29 am to Dr. Silverio Decamp , who verbally acknowledged these results. Electronically Signed   By: Rolm Baptise M.D.   On: 11/02/2015 11:30   Ct Angio Neck W/cm &/or Wo/cm  11/02/2015  CLINICAL DATA:  Right-sided weakness. EXAM: CT ANGIOGRAPHY HEAD AND NECK TECHNIQUE: Multidetector CT imaging of the head and neck was performed using the standard protocol during bolus administration of intravenous contrast. Multiplanar CT image reconstructions and MIPs were obtained to evaluate the vascular  anatomy. Carotid stenosis measurements (when applicable) are obtained utilizing NASCET criteria, using the distal internal carotid diameter as the denominator. CT perfusion was performed through the cerebral hemispheres after bolus administration of IV contrast. CONTRAST:  DOSE CURRENTLY NOT AVAILABLE.  REFERENCE EMR COMPARISON:  None. FINDINGS: These results were called by telephone at the time of interpretation on 11/02/2015 at 12:32 pm to Dr. Silverio Decamp, who verbally acknowledged these results. CTA NECK Aortic arch: No visualized aneurysm or dissection flap. Four vessel branching. Low-density  appearing fluid around the aortic arch attributed to pericardial fluid in the upper recesses. Right carotid system: Mainly noncalcified plaque at the bifurcation with positive remodeling. No flow limiting stenosis. No ulcerated plaque or dissection. Left carotid system: Predominantly noncalcified plaque at the bifurcation with approximately 30% stenosis. No dissection flap or ulcerated plaque. On coronal reformats ridge like irregularity along the medial aspect of the proximal ICA is likely kink. No convincing fibromuscular dysplasia or web. Vertebral arteries:Left vertebral artery originates from the arch. Right dominance. No flow limiting stenosis or dissection. Skeleton: No contributory finding. There is a sclerotic 12 mm lesion with low-density halo left the midline in the anterior mandible. Other similar-appearing foci seen in the bilateral mandibular bodies, 6 mm on right. These are close to vital teeth apices. Other neck: No incidental mass or adenopathy. CTA HEAD Anterior circulation: Symmetric carotid artery size. Intact circle-of-Willis. Atherosclerotic calcification of the carotid siphons. Upper division distal M2 cut off on the right. No flow limiting proximal stenosis.  No aneurysm. Posterior circulation: Symmetric vertebral arteries. Symmetric vertebrobasilar branching. Diminutive P1 segments in the setting of large posterior communicating arteries. No vessel cut off. No aneurysm. Venous sinuses: Patent as permitted by contrast timing. Anatomic variants: Intact circle-of-Willis CEREBRAL PERFUSION: Arterial and venous input its are appropriate. No confounding motion. Throughout the posterior upper division right MCA territory there is prolonged mean transit time, predominately with preserved cerebral blood flow, consistent with penumbra. Along the deep margin of the ischemic area there are patchy areas of probable completed infarct on subtraction maps. IMPRESSION: 1. Upper division distal right M2 cut  off with more penumbra than infarct by CT perfusion. 2. Cervical carotid atherosclerosis without flow limiting stenosis. 3. 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 Electronically Signed   By: Monte Fantasia M.D.   On: 11/02/2015 12:46   Mr Brain Wo Contrast  11/04/2015  CLINICAL DATA:  Stroke EXAM: MRI HEAD WITHOUT CONTRAST TECHNIQUE: Multiplanar, multiecho pulse sequences of the brain and surrounding structures were obtained without intravenous contrast. COMPARISON:  CT head 11/03/2015 FINDINGS: Acute infarct in the right posterior temporal and parietal lobe. Small areas of acute infarct in the medial right parietal lobe and mid right parietal lobe also present. The larger area of infarct measures approximately 5 by 2 cm. Small amount of hemorrhage in the infarct. No other areas of hemorrhage Moderate chronic microvascular ischemic change. Multiple white matter hyperintensities bilaterally. Small chronic infarct in the left cerebellum. Mild chronic ischemia in the pons Ventricle size normal.  Cerebral volume normal. Negative for mass or edema. Paranasal sinuses clear.  Normal skullbase. IMPRESSION: Acute infarct right temporoparietal lobe with a small amount of hemorrhage. Hemorrhage not visualized on CT scan yesterday. Moderate chronic microvascular ischemia Electronically Signed   By: Franchot Gallo M.D.   On: 11/04/2015 12:43   Ct Cerebral Perfusion W/cm  11/02/2015  CLINICAL DATA:  Right-sided weakness. EXAM: CT ANGIOGRAPHY HEAD AND NECK TECHNIQUE: Multidetector CT imaging of the head and neck was performed using  the standard protocol during bolus administration of intravenous contrast. Multiplanar CT image reconstructions and MIPs were obtained to evaluate the vascular anatomy. Carotid stenosis measurements (when applicable) are obtained utilizing NASCET criteria, using the distal internal carotid diameter as the denominator. CT perfusion was performed through the cerebral  hemispheres after bolus administration of IV contrast. CONTRAST:  DOSE CURRENTLY NOT AVAILABLE.  REFERENCE EMR COMPARISON:  None. FINDINGS: These results were called by telephone at the time of interpretation on 11/02/2015 at 12:32 pm to Dr. Silverio Decamp, who verbally acknowledged these results. CTA NECK Aortic arch: No visualized aneurysm or dissection flap. Four vessel branching. Low-density appearing fluid around the aortic arch attributed to pericardial fluid in the upper recesses. Right carotid system: Mainly noncalcified plaque at the bifurcation with positive remodeling. No flow limiting stenosis. No ulcerated plaque or dissection. Left carotid system: Predominantly noncalcified plaque at the bifurcation with approximately 30% stenosis. No dissection flap or ulcerated plaque. On coronal reformats ridge like irregularity along the medial aspect of the proximal ICA is likely kink. No convincing fibromuscular dysplasia or web. Vertebral arteries:Left vertebral artery originates from the arch. Right dominance. No flow limiting stenosis or dissection. Skeleton: No contributory finding. There is a sclerotic 12 mm lesion with low-density halo left the midline in the anterior mandible. Other similar-appearing foci seen in the bilateral mandibular bodies, 6 mm on right. These are close to vital teeth apices. Other neck: No incidental mass or adenopathy. CTA HEAD Anterior circulation: Symmetric carotid artery size. Intact circle-of-Willis. Atherosclerotic calcification of the carotid siphons. Upper division distal M2 cut off on the right. No flow limiting proximal stenosis.  No aneurysm. Posterior circulation: Symmetric vertebral arteries. Symmetric vertebrobasilar branching. Diminutive P1 segments in the setting of large posterior communicating arteries. No vessel cut off. No aneurysm. Venous sinuses: Patent as permitted by contrast timing. Anatomic variants: Intact circle-of-Willis CEREBRAL PERFUSION: Arterial and venous  input its are appropriate. No confounding motion. Throughout the posterior upper division right MCA territory there is prolonged mean transit time, predominately with preserved cerebral blood flow, consistent with penumbra. Along the deep margin of the ischemic area there are patchy areas of probable completed infarct on subtraction maps. IMPRESSION: 1. Upper division distal right M2 cut off with more penumbra than infarct by CT perfusion. 2. Cervical carotid atherosclerosis without flow limiting stenosis. 3. 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 Electronically Signed   By: Monte Fantasia M.D.   On: 11/02/2015 12:46    ASSESSMENT AND PLAN   1. 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  2. Recurrent PAF - CHADSVasc score 4.  On PO Cardizem 60mg  q 6 hours, change to CD prior to discharge. Rate is relatively stable.   3. Hypertensive heart disease - BP followed by neuro  Signed, Camya Haydon PA-C Pager [phone]

## 2015-11-06 ENCOUNTER — Encounter (HOSPITAL_COMMUNITY): Payer: Self-pay | Admitting: Nurse Practitioner

## 2015-11-06 DIAGNOSIS — I4729 Other ventricular tachycardia: Secondary | ICD-10-CM

## 2015-11-06 DIAGNOSIS — Z9119 Patient's noncompliance with other medical treatment and regimen: Secondary | ICD-10-CM

## 2015-11-06 DIAGNOSIS — D164 Benign neoplasm of bones of skull and face: Secondary | ICD-10-CM

## 2015-11-06 DIAGNOSIS — I959 Hypotension, unspecified: Secondary | ICD-10-CM | POA: Diagnosis present

## 2015-11-06 DIAGNOSIS — Z91199 Patient's noncompliance with other medical treatment and regimen due to unspecified reason: Secondary | ICD-10-CM

## 2015-11-06 DIAGNOSIS — E785 Hyperlipidemia, unspecified: Secondary | ICD-10-CM

## 2015-11-06 DIAGNOSIS — I472 Ventricular tachycardia: Secondary | ICD-10-CM

## 2015-11-06 DIAGNOSIS — D165 Benign neoplasm of lower jaw bone: Secondary | ICD-10-CM

## 2015-11-06 LAB — GLUCOSE, CAPILLARY
Glucose-Capillary: 99 mg/dL (ref 65–99)
Glucose-Capillary: 117 mg/dL — ABNORMAL HIGH (ref 65–99)

## 2015-11-06 MED ORDER — METOPROLOL TARTRATE 25 MG PO TABS
12.5000 mg | ORAL_TABLET | Freq: Two times a day (BID) | ORAL | Status: DC
Start: 2015-11-06 — End: 2016-03-07

## 2015-11-06 MED ORDER — ATORVASTATIN CALCIUM 20 MG PO TABS: 20.0000 mg | ORAL_TABLET | Freq: Every day | ORAL | Status: DC

## 2015-11-06 MED ORDER — DILTIAZEM HCL ER COATED BEADS 120 MG PO TB24: 240.0000 mg | ORAL_TABLET | Freq: Every day | ORAL | Status: DC

## 2015-11-06 MED ORDER — METOPROLOL TARTRATE 12.5 MG HALF TABLET
12.5000 mg | ORAL_TABLET | Freq: Two times a day (BID) | ORAL | Status: DC
  Administered 2015-11-06: 12.5 mg via ORAL
  Filled 2015-11-06: qty 1

## 2015-11-06 MED ORDER — DILTIAZEM HCL ER COATED BEADS 120 MG PO TB24
240.0000 mg | ORAL_TABLET | Freq: Every day | ORAL | Status: DC
Start: 2015-11-06 — End: 2015-11-06
  Administered 2015-11-06: 240 mg via ORAL
  Filled 2015-11-06: qty 2

## 2015-11-06 MED ORDER — RIVAROXABAN 20 MG PO TABS: 20.0000 mg | ORAL_TABLET | Freq: Every day | ORAL | Status: DC

## 2015-11-06 MED ORDER — ASPIRIN 325 MG PO TBEC: 325.0000 mg | DELAYED_RELEASE_TABLET | Freq: Every day | ORAL | Status: AC

## 2015-11-06 MED FILL — **XARELTO 20 MG TABLET: 20 MG | 30 days supply | Qty: 30 | Fill #0

## 2015-11-06 MED FILL — ATORVASTATIN 20 MG TABLET: 20 | 30 days supply | Qty: 30 | Fill #0

## 2015-11-06 MED FILL — CARDIZEM LA 120 MG TABLET: 120 | 15 days supply | Qty: 30 | Fill #0

## 2015-11-06 NOTE — Progress Notes (Signed)
Patient Name: [name] Date of Encounter: 11/06/2015   SUBJECTIVE  No new complaints overnight.  MRI shows a small area of hemorrhagic transformation. Xarelto has been held. She has had some intermittent NSVT - on diltiazem.  CURRENT MEDS .  stroke: mapping our early stages of recovery book   Does not apply Once  . aspirin EC  325 mg Oral Daily  . atorvastatin  20 mg Oral q1800  . diltiazem  90 mg Oral 4 times per day  . insulin aspart  0-15 Units Subcutaneous TID WC  . pantoprazole  40 mg Oral QHS    OBJECTIVE  Filed Vitals:   11/05/15 1349 11/05/15 2129 11/06/15 0000 11/06/15 0510  BP: 131/86 147/113 122/108 136/73  Pulse: 120 98 122 102  Temp: 98.3 F (36.8 C) 98.2 F (36.8 C)  98.1 F (36.7 C)  TempSrc: Oral Oral  Oral  Resp: 18 16  18   Weight:      SpO2: 98% 98%  100%    Intake/Output Summary (Last 24 hours) at 11/06/15 1047 Last data filed at 11/06/15 0655  Gross per 24 hour  Intake    240 ml  Output   3600 ml  Net  -3360 ml   Filed Weights   11/02/15 1125  Weight: 189 lb 9.5 oz (86 kg)    PHYSICAL EXAM  General: Pleasant, NAD. Neuro: Alert and oriented X 3. Moves all extremities spontaneously. Psych: Normal affect. HEENT:  Normal  Neck: Supple without bruits or JVD. Lungs:  Resp regular and unlabored, CTA. Heart: IR IR  no s3, s4, or murmurs. Abdomen: Soft, non-tender, non-distended, BS + x 4.  Extremities: No clubbing, cyanosis or edema. DP/PT/Radials 2+ and equal bilaterally.  Accessory Clinical Findings  CBC No results for input(s): WBC, NEUTROABS, HGB, HCT, MCV, PLT in the last 72 hours. Basic Metabolic Panel  Recent Labs  11/03/15 1700 11/05/15 0631  NA 142 141  K 3.9 3.9  CL 107 109  CO2 22 23  GLUCOSE 132* 96  BUN 11 10  CREATININE 1.01* 0.85  CALCIUM 8.9 8.7*  MG 1.8  --    Liver Function Tests No results for input(s): AST, ALT, ALKPHOS, BILITOT, PROT, ALBUMIN in the last 72 hours. Fasting Lipid Panel No results  for input(s): CHOL, HDL, LDLCALC, TRIG, CHOLHDL, LDLDIRECT in the last 72 hours.  TELE  afib with rate mostly in 90s to low 100s. Intermittently goes to 120s  Radiology/Studies  Ct Angio Head W/cm &/or Wo Cm  11/02/2015  CLINICAL DATA:  Right-sided weakness. EXAM: CT ANGIOGRAPHY HEAD AND NECK TECHNIQUE: Multidetector CT imaging of the head and neck was performed using the standard protocol during bolus administration of intravenous contrast. Multiplanar CT image reconstructions and MIPs were obtained to evaluate the vascular anatomy. Carotid stenosis measurements (when applicable) are obtained utilizing NASCET criteria, using the distal internal carotid diameter as the denominator. CT perfusion was performed through the cerebral hemispheres after bolus administration of IV contrast. CONTRAST:  DOSE CURRENTLY NOT AVAILABLE.  REFERENCE EMR COMPARISON:  None. FINDINGS: These results were called by telephone at the time of interpretation on 11/02/2015 at 12:32 pm to Dr. Silverio Decamp, who verbally acknowledged these results. CTA NECK Aortic arch: No visualized aneurysm or dissection flap. Four vessel branching. Low-density appearing fluid around the aortic arch attributed to pericardial fluid in the upper recesses. Right carotid system: Mainly noncalcified plaque at the bifurcation with positive remodeling. No flow limiting stenosis. No ulcerated plaque or dissection. Left  carotid system: Predominantly noncalcified plaque at the bifurcation with approximately 30% stenosis. No dissection flap or ulcerated plaque. On coronal reformats ridge like irregularity along the medial aspect of the proximal ICA is likely kink. No convincing fibromuscular dysplasia or web. Vertebral arteries:Left vertebral artery originates from the arch. Right dominance. No flow limiting stenosis or dissection. Skeleton: No contributory finding. There is a sclerotic 12 mm lesion with low-density halo left the midline in the anterior mandible.  Other similar-appearing foci seen in the bilateral mandibular bodies, 6 mm on right. These are close to vital teeth apices. Other neck: No incidental mass or adenopathy. CTA HEAD Anterior circulation: Symmetric carotid artery size. Intact circle-of-Willis. Atherosclerotic calcification of the carotid siphons. Upper division distal M2 cut off on the right. No flow limiting proximal stenosis.  No aneurysm. Posterior circulation: Symmetric vertebral arteries. Symmetric vertebrobasilar branching. Diminutive P1 segments in the setting of large posterior communicating arteries. No vessel cut off. No aneurysm. Venous sinuses: Patent as permitted by contrast timing. Anatomic variants: Intact circle-of-Willis CEREBRAL PERFUSION: Arterial and venous input its are appropriate. No confounding motion. Throughout the posterior upper division right MCA territory there is prolonged mean transit time, predominately with preserved cerebral blood flow, consistent with penumbra. Along the deep margin of the ischemic area there are patchy areas of probable completed infarct on subtraction maps. IMPRESSION: 1. Upper division distal right M2 cut off with more penumbra than infarct by CT perfusion. 2. Cervical carotid atherosclerosis without flow limiting stenosis. 3. 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 Electronically Signed   By: Monte Fantasia M.D.   On: 11/02/2015 12:46   Ct Head Wo Contrast  11/03/2015  CLINICAL DATA:  Stroke EXAM: CT HEAD WITHOUT CONTRAST TECHNIQUE: Contiguous axial images were obtained from the base of the skull through the vertex without intravenous contrast. COMPARISON:  CT perfusion 11/02/2015 FINDINGS: 15 mm area of hypodensity in the right superior temporal lobe compatible with acute infarct. This area showed increased mean transit time on the CT perfusion . No associated hemorrhage. No shift of the midline structures. Ventricle size is normal.  No other areas of acute infarct.  Calvarium intact. IMPRESSION: Development of acute infarct in the right superior temporal lobe without hemorrhage. Electronically Signed   By: Franchot Gallo M.D.   On: 11/03/2015 15:25   Ct Head Wo Contrast  11/02/2015  CLINICAL DATA:  Code stroke.  Right side weakness. EXAM: CT HEAD WITHOUT CONTRAST TECHNIQUE: Contiguous axial images were obtained from the base of the skull through the vertex without intravenous contrast. COMPARISON:  02/05/2013 FINDINGS: No acute intracranial abnormality. Specifically, no hemorrhage, hydrocephalus, mass lesion, acute infarction, or significant intracranial injury. No acute calvarial abnormality. Visualized paranasal sinuses and mastoids clear. Orbital soft tissues unremarkable. IMPRESSION: No acute intracranial abnormality. Critical Value/emergent results were called by telephone at the time of interpretation on 11/02/2015 at 11:29 am to Dr. Silverio Decamp , who verbally acknowledged these results. Electronically Signed   By: Rolm Baptise M.D.   On: 11/02/2015 11:30   Ct Angio Neck W/cm &/or Wo/cm  11/02/2015  CLINICAL DATA:  Right-sided weakness. EXAM: CT ANGIOGRAPHY HEAD AND NECK TECHNIQUE: Multidetector CT imaging of the head and neck was performed using the standard protocol during bolus administration of intravenous contrast. Multiplanar CT image reconstructions and MIPs were obtained to evaluate the vascular anatomy. Carotid stenosis measurements (when applicable) are obtained utilizing NASCET criteria, using the distal internal carotid diameter as the denominator. CT perfusion was performed through the cerebral hemispheres after  bolus administration of IV contrast. CONTRAST:  DOSE CURRENTLY NOT AVAILABLE.  REFERENCE EMR COMPARISON:  None. FINDINGS: These results were called by telephone at the time of interpretation on 11/02/2015 at 12:32 pm to Dr. Silverio Decamp, who verbally acknowledged these results. CTA NECK Aortic arch: No visualized aneurysm or dissection flap. Four vessel  branching. Low-density appearing fluid around the aortic arch attributed to pericardial fluid in the upper recesses. Right carotid system: Mainly noncalcified plaque at the bifurcation with positive remodeling. No flow limiting stenosis. No ulcerated plaque or dissection. Left carotid system: Predominantly noncalcified plaque at the bifurcation with approximately 30% stenosis. No dissection flap or ulcerated plaque. On coronal reformats ridge like irregularity along the medial aspect of the proximal ICA is likely kink. No convincing fibromuscular dysplasia or web. Vertebral arteries:Left vertebral artery originates from the arch. Right dominance. No flow limiting stenosis or dissection. Skeleton: No contributory finding. There is a sclerotic 12 mm lesion with low-density halo left the midline in the anterior mandible. Other similar-appearing foci seen in the bilateral mandibular bodies, 6 mm on right. These are close to vital teeth apices. Other neck: No incidental mass or adenopathy. CTA HEAD Anterior circulation: Symmetric carotid artery size. Intact circle-of-Willis. Atherosclerotic calcification of the carotid siphons. Upper division distal M2 cut off on the right. No flow limiting proximal stenosis.  No aneurysm. Posterior circulation: Symmetric vertebral arteries. Symmetric vertebrobasilar branching. Diminutive P1 segments in the setting of large posterior communicating arteries. No vessel cut off. No aneurysm. Venous sinuses: Patent as permitted by contrast timing. Anatomic variants: Intact circle-of-Willis CEREBRAL PERFUSION: Arterial and venous input its are appropriate. No confounding motion. Throughout the posterior upper division right MCA territory there is prolonged mean transit time, predominately with preserved cerebral blood flow, consistent with penumbra. Along the deep margin of the ischemic area there are patchy areas of probable completed infarct on subtraction maps. IMPRESSION: 1. Upper  division distal right M2 cut off with more penumbra than infarct by CT perfusion. 2. Cervical carotid atherosclerosis without flow limiting stenosis. 3. 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 Electronically Signed   By: Monte Fantasia M.D.   On: 11/02/2015 12:46   Mr Brain Wo Contrast  11/04/2015  CLINICAL DATA:  Stroke EXAM: MRI HEAD WITHOUT CONTRAST TECHNIQUE: Multiplanar, multiecho pulse sequences of the brain and surrounding structures were obtained without intravenous contrast. COMPARISON:  CT head 11/03/2015 FINDINGS: Acute infarct in the right posterior temporal and parietal lobe. Small areas of acute infarct in the medial right parietal lobe and mid right parietal lobe also present. The larger area of infarct measures approximately 5 by 2 cm. Small amount of hemorrhage in the infarct. No other areas of hemorrhage Moderate chronic microvascular ischemic change. Multiple white matter hyperintensities bilaterally. Small chronic infarct in the left cerebellum. Mild chronic ischemia in the pons Ventricle size normal.  Cerebral volume normal. Negative for mass or edema. Paranasal sinuses clear.  Normal skullbase. IMPRESSION: Acute infarct right temporoparietal lobe with a small amount of hemorrhage. Hemorrhage not visualized on CT scan yesterday. Moderate chronic microvascular ischemia Electronically Signed   By: Franchot Gallo M.D.   On: 11/04/2015 12:43   Ct Cerebral Perfusion W/cm  11/02/2015  CLINICAL DATA:  Right-sided weakness. EXAM: CT ANGIOGRAPHY HEAD AND NECK TECHNIQUE: Multidetector CT imaging of the head and neck was performed using the standard protocol during bolus administration of intravenous contrast. Multiplanar CT image reconstructions and MIPs were obtained to evaluate the vascular anatomy. Carotid stenosis measurements (when applicable) are obtained  utilizing NASCET criteria, using the distal internal carotid diameter as the denominator. CT perfusion was  performed through the cerebral hemispheres after bolus administration of IV contrast. CONTRAST:  DOSE CURRENTLY NOT AVAILABLE.  REFERENCE EMR COMPARISON:  None. FINDINGS: These results were called by telephone at the time of interpretation on 11/02/2015 at 12:32 pm to Dr. Silverio Decamp, who verbally acknowledged these results. CTA NECK Aortic arch: No visualized aneurysm or dissection flap. Four vessel branching. Low-density appearing fluid around the aortic arch attributed to pericardial fluid in the upper recesses. Right carotid system: Mainly noncalcified plaque at the bifurcation with positive remodeling. No flow limiting stenosis. No ulcerated plaque or dissection. Left carotid system: Predominantly noncalcified plaque at the bifurcation with approximately 30% stenosis. No dissection flap or ulcerated plaque. On coronal reformats ridge like irregularity along the medial aspect of the proximal ICA is likely kink. No convincing fibromuscular dysplasia or web. Vertebral arteries:Left vertebral artery originates from the arch. Right dominance. No flow limiting stenosis or dissection. Skeleton: No contributory finding. There is a sclerotic 12 mm lesion with low-density halo left the midline in the anterior mandible. Other similar-appearing foci seen in the bilateral mandibular bodies, 6 mm on right. These are close to vital teeth apices. Other neck: No incidental mass or adenopathy. CTA HEAD Anterior circulation: Symmetric carotid artery size. Intact circle-of-Willis. Atherosclerotic calcification of the carotid siphons. Upper division distal M2 cut off on the right. No flow limiting proximal stenosis.  No aneurysm. Posterior circulation: Symmetric vertebral arteries. Symmetric vertebrobasilar branching. Diminutive P1 segments in the setting of large posterior communicating arteries. No vessel cut off. No aneurysm. Venous sinuses: Patent as permitted by contrast timing. Anatomic variants: Intact circle-of-Willis CEREBRAL  PERFUSION: Arterial and venous input its are appropriate. No confounding motion. Throughout the posterior upper division right MCA territory there is prolonged mean transit time, predominately with preserved cerebral blood flow, consistent with penumbra. Along the deep margin of the ischemic area there are patchy areas of probable completed infarct on subtraction maps. IMPRESSION: 1. Upper division distal right M2 cut off with more penumbra than infarct by CT perfusion. 2. Cervical carotid atherosclerosis without flow limiting stenosis. 3. 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 Electronically Signed   By: Monte Fantasia M.D.   On: 11/02/2015 12:46    ASSESSMENT AND PLAN   1. 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  2. Recurrent PAF - CHADSVasc score 4.  On PO Cardizem 90 q6 hrs. Some NSVT noted. Convert diltiazem over to 240 mg LA daily today. Add low dose metoprolol 12.5 mg BID for additional rate control and for NSVT.  3. Hypertensive heart disease - BP followed by neuro - improved with increases in cardizem.  Pixie Casino, MD, [facility] Attending Cardiologist [place]

## 2015-11-06 NOTE — Discharge Summary (Signed)
Stroke Discharge Summary  Patient ID: [name]   MRN: [id number]      DOB: [birth year]  Date of Admission: 11/02/2015 Date of Discharge: 11/06/2015  Attending Physician:  Garvin Fila, MD, Stroke MD Consulting Physician(s):      Unk Lightning, MD (cardiology) Patient's PCP:  Angelica Chessman, MD  DISCHARGE DIAGNOSIS:  Principal Problem:   Cerebral infarction due to embolism of right middle cerebral artery ([place]) s/p IV tPA Active Problems:   HTN (hypertension)   Cigarette smoker   Atrial fibrillation with RVR ([place])   Hypotension   Medical non-compliance   Hyperlipidemia LDL goal <70   Cemento-osseous dysplasia   NSVT (nonsustained ventricular tachycardia) (HCC)  BMI: Body mass index is 33.59 kg/(m^2).  Past Medical History  Diagnosis Date  . Paroxysmal a-fib ([place])   . Shortness of breath   . Hypertension   . Transient ischemic attack (TIA) 01/2013  . Encounter for long-term (current) use of other medications   . Heart murmur   . Pneumonia 02/2011; 08/2011; 03/22/2014   Past Surgical History  Procedure Laterality Date  . Tee without cardioversion N/A 02/18/2013    Procedure: TRANSESOPHAGEAL ECHOCARDIOGRAM (TEE);  Surgeon: Sanda Klein, MD;  Location: [place];  Service: Cardiovascular;  Laterality: N/A;  . Cardioversion N/A 02/18/2013    Procedure: CARDIOVERSION;  Surgeon: Sanda Klein, MD;  Location: [facility] ENDOSCOPY;  Service: Cardiovascular;  Laterality: N/A;  . Cardioversion N/A 02/23/2013    Procedure: CARDIOVERSION;  Surgeon: Leonie Man, MD;  Location: [place];  Service: Cardiovascular;  Laterality: N/A;  BESIDE CV  . Tubal ligation  1992      Medication List    STOP taking these medications        hydrALAZINE 50 MG tablet  Commonly known as:  APRESOLINE     hydrochlorothiazide 12.5 MG capsule  Commonly known as:  MICROZIDE      TAKE these medications        aspirin 325 MG EC tablet  Take 1 tablet (325 mg total) by mouth daily.      atorvastatin 20 MG tablet  Commonly known as:  LIPITOR  Take 1 tablet (20 mg total) by mouth daily at 6 PM.     diltiazem 120 MG 24 hr tablet  Commonly known as:  CARDIZEM LA  Take 2 tablets (240 mg total) by mouth daily.     metoprolol tartrate 25 MG tablet  Commonly known as:  LOPRESSOR  Take 0.5 tablets (12.5 mg total) by mouth 2 (two) times daily.     rivaroxaban 20 MG Tabs tablet  Commonly known as:  XARELTO  Take 1 tablet (20 mg total) by mouth daily with supper.  Start taking on:  11/13/2015        LABORATORY STUDIES CBC    Component Value Date/Time   WBC 9.7 11/02/2015 1114   RBC 4.98 11/02/2015 1114   HGB 15.6* 11/02/2015 1120   HCT 46.0 11/02/2015 1120   PLT 225 11/02/2015 1114   MCV 82.9 11/02/2015 1114   MCH 26.1 11/02/2015 1114   MCHC 31.5 11/02/2015 1114   RDW 15.1 11/02/2015 1114   LYMPHSABS 2.3 11/02/2015 1114   MONOABS 0.4 11/02/2015 1114   EOSABS 0.1 11/02/2015 1114   BASOSABS 0.0 11/02/2015 1114   CMP    Component Value Date/Time   NA 141 11/05/2015 0631   K 3.9 11/05/2015 0631   CL 109 11/05/2015 0631   CO2 23 11/05/2015 0631  GLUCOSE 96 11/05/2015 0631   BUN 10 11/05/2015 0631   CREATININE 0.85 11/05/2015 0631   CREATININE 0.87 03/22/2015 1301   CALCIUM 8.7* 11/05/2015 0631   PROT 7.3 11/02/2015 1114   ALBUMIN 3.4* 11/02/2015 1114   AST 47* 11/02/2015 1114   ALT 54 11/02/2015 1114   ALKPHOS 85 11/02/2015 1114   BILITOT 1.1 11/02/2015 1114   GFRNONAA >60 11/05/2015 0631   GFRNONAA 78 03/22/2015 1301   GFRAA >60 11/05/2015 0631   GFRAA >89 03/22/2015 1301   COAGS Lab Results  Component Value Date   INR 1.23 11/02/2015   INR 1.03 09/16/2014   INR 1.07 04/11/2014   Lipid Panel    Component Value Date/Time   CHOL 131 11/03/2015 0405   TRIG 48 11/03/2015 0405   HDL 32* 11/03/2015 0405   CHOLHDL 4.1 11/03/2015 0405   VLDL 10 11/03/2015 0405   LDLCALC 89 11/03/2015 0405   HgbA1C  Lab Results  Component Value Date   HGBA1C  6.5* 11/03/2015   Cardiac Panel (last 3 results) No results for input(s): CKTOTAL, CKMB, TROPONINI, RELINDX in the last 72 hours. Urinalysis    Component Value Date/Time   COLORURINE YELLOW 11/02/2015 1136   APPEARANCEUR CLEAR 11/02/2015 1136   LABSPEC 1.016 11/02/2015 1136   PHURINE 6.0 11/02/2015 1136   GLUCOSEU NEGATIVE 11/02/2015 1136   HGBUR NEGATIVE 11/02/2015 1136   BILIRUBINUR NEGATIVE 11/02/2015 1136   KETONESUR NEGATIVE 11/02/2015 1136   PROTEINUR NEGATIVE 11/02/2015 1136   UROBILINOGEN 1 03/22/2015 1301   NITRITE NEGATIVE 11/02/2015 1136   LEUKOCYTESUR SMALL* 11/02/2015 1136   Urine Drug Screen     Component Value Date/Time   LABOPIA NONE DETECTED 11/02/2015 1136   COCAINSCRNUR NONE DETECTED 11/02/2015 1136   LABBENZ NONE DETECTED 11/02/2015 1136   AMPHETMU NONE DETECTED 11/02/2015 1136   THCU NONE DETECTED 11/02/2015 1136   LABBARB NONE DETECTED 11/02/2015 1136    Alcohol Level    Component Value Date/Time   ETH <5 11/02/2015 1114     SIGNIFICANT DIAGNOSTIC STUDIES Ct Angio Head W/cm &/or Wo Cm 11/02/2015  1. Upper division distal right M2 cut off with more penumbra than infarct by CT perfusion.  2. Cervical carotid atherosclerosis without flow limiting stenosis.  3. 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   Ct Head Wo Contrast 11/03/2015 Development of acute infarct in the right superior temporal lobe without hemorrhage. 11/02/2015  No acute intracranial abnormality.   Ct Angio Neck W/cm &/or Wo/cm 11/02/2015  1. Upper division distal right M2 cut off with more penumbra than infarct by CT perfusion.  2. Cervical carotid atherosclerosis without flow limiting stenosis.  3. 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   Ct Cerebral Perfusion W/cm 11/02/2015  1. Upper division distal right M2 cut off with more penumbra than infarct by CT perfusion.  2. Cervical carotid atherosclerosis without flow  limiting stenosis.  3. 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  Mr Brain Wo Contrast 11/04/2015 Acute infarct right temporoparietal lobe with a small amount of hemorrhage. Hemorrhage not visualized on CT scan yesterday. Moderate chronic microvascular ischemia   2D Echocardiogram  - Left ventricle: The cavity size was normal. Wall thickness wasincreased in a pattern of moderate LVH. Systolic function was normal. The estimated ejection fraction was in the range of 60%to 65%. Wall motion was normal; there were no regional wallmotion abnormalities. - Aortic valve: There was mild regurgitation. Valve area (VTI):2.33 cm^2. Valve area (Vmax): 2.45 cm^2. - Mitral valve:  There was mild regurgitation. - Left atrium: The atrium was severely dilated. - Right atrium: The atrium was severely dilated. - Atrial septum: No defect or patent foramen ovale was identified. - Pulmonary arteries: Systolic pressure was mildly increased. PApeak pressure: 35 mm Hg (S). - Technically adequate study.     HISTORY OF PRESENT ILLNESS [name] is an 50 y.o. 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 She was sitting and having a meal with coworkers when she slumped to the left. Time of onset was 10:30 AM 11/02/2015. Patient has a known history of atrial fibrillation and was followed by cardiology. Known to have poor compliance with her medications as outpatient. She was on Xarelto previously for A. Fib but she self discontinued 6 months ago. She reports reason for discontinuation was after she saw the TV advertisements about xeralto causing bleeding problems.   She denies any recent bleeding issues. No recent surgeries, head trauma, no history of prior strokes a recent MRI, No prior intracranial hemorrhage history. She only takes antihypertensive medicines, not on any antiplatelet or  anticoagulant medications. her initial neurologic examination Showed dense left hemi-neglect, With right left confusion, Moderate left hemiparesis that she had drift in left upper and lower extremities, And left facial weakness dysarthria. she is in A. Fib with rapid rate up to 140s and 150s. Blood pressure recording was inaccurate, showing  systolics in Q000111Q to Q000111Q but diastolics about A999333. Manual blood pressure using a Doppler was obtained which confirmed hypotension with systolic blood pressures and in the 80s. She was immediately started on IV fluid boluses with normal saline 2 L which improved her blood pressure to A999333 to [id number] systolic range. After confirming no contraindications, and discussing the risk benefit of IV TPA, patient agreed to proceed.  IV TPA was started in the ER at 11:41 AM , roughly about 1 hour 10 minutes after symptom onset. Within about 15 minutes after starting IV TPA, Patient was noted to have significant improvement of her left hemi-neglect. The left hemiparesis is also improved with no drift, And only had mild weakness with resistance testing, Mild residual left facial weakness with flattening of the nasolabial fold, Improved dysarthria, No sensory loss.   A stat CT angiogram of the head and neck with perfusion study showed a distal right M2 branch thrombus with perfusion deficits noted in the right parietal lobe. Discussed her imaging findings and clinical symptoms with Dr. Elroy Channel for any role for interventional therapy at this point. Given that the patient's symptoms have significant improvement and they're mild at this time after the IV TPA,, and unstable cardiac situation with RVR and labile blood pressure, requiring amiodarone for RVR, I felt it was best not to intubate the patient for any interventional procedures at this time. She is admitted to the ICU for post-TPA monitoring and care.   Cardiology was consulted in the ER and they're managing her A.  Fib with RVR.    HOSPITAL COURSE Ms. NIKIYAH GONSALES is a 50 y.o. female with history of hypertension, previous TIA, atrial fibrillation, and poor medical compliance (stopped her Xarelto 6 months ago) presenting with left-sided weakness with right gaze preference. She received IV t-PA at 11:41on 11/01/2017.   Stroke:  Non-dominant right temporoparietal lobe infarct status post IV TPA with a small amount of asymptomatic hemorrhagic transformation,  infarct embolic secondary to atrial fibrillation.  Resultant Resolved deficits  MRI - right temporal lobe infarct with small amount of hemorrhagic transformation CTA head and neck and CT perfusion scan - right M2 cut off , no perfusion deficit 2D Echo - EF 60-65%. No cardiac source of emboli identified.  LDL - 89  HgbA1c 6.5 No antithrombotic prior to admission, Xarelto on hold and now on ASA 325mg  secondary hemorrhagic transformation. Continue aspirin 1 week, then resume Xarelto  Ongoing aggressive stroke risk factor management  Therapy recommendations: no therapy needs Disposition: return home  Atrial Fibrillation with RVR  Home anticoagulation:  On Xarelto prior to admission, the patient stopped 6 months ago  Xarelto on hold in hospital is TPA given hemorrhagic transformation  Plan continue aspirin 1 week, then resume [place]   Cardiology consulted, started cardizem for rate control. Recommended change to CD prior to discharge   Per cardiology: Rate control <110 is reasonable target.   Hypertension, history Hypertension  Blood pressure mildly low at times. Improving with fluids. Blood pressures have been running low, patient still improving clinically. Blood pressure goals above A999333 systolic at a minimum.  No Hx of CHF and EF normal on echo.   Hyperlipidemia  Home meds: No lipid lowering medications prior to admission  LDL 89, goal < 70  Lipitor 20 mg daily added  Continue statin at discharge  Other Stroke Risk Factors   Cigarette smoker, quit smoking two years ago.  ETOH use  Obesity, Body mass index is 33.59 kg/(m^2).  Hx stroke/TIA   Other Active Problems  Long history Medical noncompliance.  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 It is most common in African-American females)  Nonsustained V. Tach 1 15 beats - cardiology adjust the medication   DISCHARGE EXAM Blood pressure 136/73, pulse 102, temperature 98.1 F (36.7 C), temperature source Oral, resp. rate 18, weight 86 kg (189 lb 9.5 oz), SpO2 100 %. Gen: NAD  CV: irregular, no MRG. No Carotid Bruits. No peripheral edema, warm, nontender Eyes: Conjunctivae clear without exudates or hemorrhage Neuro: Detailed Neurologic Exam Speech:  Speech is normal; fluent and spontaneous with normal comprehension.  Cognition:  The patient is oriented to person, place, and time;   recent and remote memory intact;   language fluent;   normal attention, concentration,   fund of knowledge Cranial Nerves:  The pupils are equal, round, and reactive to light. The fundi are normal. Visual fields arokaye full to finger confrontation. Extraocular movements are intact. Trigeminal sensation is intact and the muscles of mastication are normal. Mild left facial droop. The palate elevates in the midline. Hearing intact. Voice is normal. Shoulder shrug is normal. The tongue has normal motion without fasciculations.  Motor Observation:  No asymmetry, no atrophy, and no involuntary movements noted. Tone:  Normal muscle tone.  Posture:  Posture is normal. normal erect Strength: strength is V/V in the upper and lower limbs. Yesterday had mild-left-sided weakness.  Sensation: She endorses no sensory loss to LT and there is no neglect.   Discharge Diet   Diet Carb  Modified Fluid consistency:: Thin; Room service appropriate?: Yes liquids okay DISCHARGE PLAN  Disposition:  Return home  Continue aspirin 1 week then Resume Xarelto (rivaroxaban) daily for secondary stroke prevention Follow-up JEGEDE, OLUGBEMIGA, MD in 2 weeks.  Follow-up with Dr. Antony Contras, Stroke Clinic in 2 months.  35 minutes were spent preparing discharge.  Burnetta Sabin  [facility] See Amion for Pager information 11/06/2015 11:11 AM   I have personally examined this patient, reviewed notes, independently viewed imaging studies, participated in medical decision making and plan of care. I have made any additions or clarifications directly to the above note. Agree with note above.    Antony Contras, MD Medical Director [facility] Pager: [phone] 11/12/2015 8:37 AM

## 2015-11-06 NOTE — Progress Notes (Signed)
Per [employer], Pt had 11 run of V tach. MD paged and aware. No new orders given.

## 2015-11-06 NOTE — Progress Notes (Signed)
Pt given discharge instructions, prescriptions, and care notes. Pt verbalized understanding AEB no further questions or concerns at this time. IV was discontinued, no redness, pain, or swelling noted at this time. Telemetry discontinued and Centralized Telemetry was notified. Pt left the floor via wheelchair with staff in stable condition. 

## 2015-11-06 NOTE — Care Management Note (Addendum)
Case Management Note  Patient Details  Name: TAJE LILL MRN: [id number] Date of Birth: [birth year]  Subjective/Objective:                 Spoke with patient at the bedside, she lives with her daughter, drives, denies any barriers to care. Appointment made at [facility] for next week. Patient received pamphlet from [facility]. CM explained to patient that they may use the on site pharmacy to fill prescriptions given to them at discharge. Patient aware that the [facility] pharmacy will not fill narcotics or pain medications prior to the patient being seen by one of their physicians.  Patient aware that they must be seen as a patient prior to the pharmacy filling the prescriptions a second time. Admitted with stroke, no deficits, anticoag at DC is ASA, will start xaralto next week per patient. 30 day card given.  Action/Plan:  No further CM identified.   Expected Discharge Date:  11/05/15               Expected Discharge Plan:  Home/Self Care  In-House Referral:     Discharge planning Services  CM Consult, [facility]  Post Acute Care Choice:    Choice offered to:     DME Arranged:    DME Agency:     HH Arranged:    HH Agency:     Status of Service:  Completed, signed off  Medicare Important Message Given:    Date Medicare IM Given:    Medicare IM give by:    Date Additional Medicare IM Given:    Additional Medicare Important Message give by:     If discussed at [place] of Stay Meetings, dates discussed:    Additional Comments:  Carles Collet, RN 11/06/2015, 11:17 AM

## 2015-11-12 ENCOUNTER — Ambulatory Visit: Payer: Self-pay | Admitting: Internal Medicine

## 2015-11-12 ENCOUNTER — Telehealth: Payer: Self-pay | Admitting: Neurology

## 2015-11-12 NOTE — Telephone Encounter (Signed)
Called [facility] back and advised, patient has never been seen in our office, will need to contact Dr. Erlinda Hong on 3rd floor at [facility], who is working out of the hospital this week, to take care of note.

## 2015-11-12 NOTE — Telephone Encounter (Signed)
[name] called to advise, patient needs note to return to work, patient works for [employer] and they will not allow her to return to work without a note. Patient's phone number [phone].

## 2015-11-13 NOTE — Telephone Encounter (Signed)
Rn call patient to let her know that both letters will be ready tomorrow. Dr. Leonie Man sign both letters.Pt was cleared back to her full time job and part time job.  Letter was written on [facility] letter head because pt does not see Dr.Sethi till 12/24/2015.

## 2015-11-14 ENCOUNTER — Ambulatory Visit: Payer: Self-pay | Attending: Internal Medicine | Admitting: Internal Medicine

## 2015-11-14 ENCOUNTER — Encounter: Payer: Self-pay | Admitting: Internal Medicine

## 2015-11-14 VITALS — BP 126/89 | HR 84 | Temp 98.4°F | Resp 16 | Ht 63.0 in | Wt 187.0 lb

## 2015-11-14 DIAGNOSIS — Z131 Encounter for screening for diabetes mellitus: Secondary | ICD-10-CM | POA: Insufficient documentation

## 2015-11-14 DIAGNOSIS — Z0000 Encounter for general adult medical examination without abnormal findings: Secondary | ICD-10-CM

## 2015-11-14 DIAGNOSIS — Z79899 Other long term (current) drug therapy: Secondary | ICD-10-CM | POA: Insufficient documentation

## 2015-11-14 DIAGNOSIS — I63411 Cerebral infarction due to embolism of right middle cerebral artery: Secondary | ICD-10-CM | POA: Insufficient documentation

## 2015-11-14 DIAGNOSIS — Z880 Allergy status to penicillin: Secondary | ICD-10-CM | POA: Insufficient documentation

## 2015-11-14 DIAGNOSIS — Z8673 Personal history of transient ischemic attack (TIA), and cerebral infarction without residual deficits: Secondary | ICD-10-CM | POA: Insufficient documentation

## 2015-11-14 DIAGNOSIS — Z87891 Personal history of nicotine dependence: Secondary | ICD-10-CM | POA: Insufficient documentation

## 2015-11-14 DIAGNOSIS — Z9889 Other specified postprocedural states: Secondary | ICD-10-CM | POA: Insufficient documentation

## 2015-11-14 DIAGNOSIS — I10 Essential (primary) hypertension: Secondary | ICD-10-CM | POA: Insufficient documentation

## 2015-11-14 DIAGNOSIS — Z7901 Long term (current) use of anticoagulants: Secondary | ICD-10-CM | POA: Insufficient documentation

## 2015-11-14 DIAGNOSIS — I480 Paroxysmal atrial fibrillation: Secondary | ICD-10-CM | POA: Insufficient documentation

## 2015-11-14 LAB — POCT GLYCOSYLATED HEMOGLOBIN (HGB A1C): HEMOGLOBIN A1C: 6.2

## 2015-11-14 LAB — GLUCOSE, POCT (MANUAL RESULT ENTRY): POC GLUCOSE: 106 mg/dL — AB (ref 70–99)

## 2015-11-14 NOTE — Patient Instructions (Signed)
Your blood sugars may be somewhat hi. Concentrate on gradual weight loss.

## 2015-11-14 NOTE — Progress Notes (Signed)
Patient's here for hospital f/up stroke. Patient states she's feeling fine.   Patient denies any pain. No further concerns.  Patient agrees to A1c screening, but declines flu shot.  Patient discharged from the hospital on 11/06/2015. She had a remarkable event. She had a cerebral infarction due to an embolism of the right middle cerebral artery. She received TPA. She did not require an embolectomy. She recovered quickly and was discharged. She is now feeling fine without any symptoms. It's worth noting that she discontinued Xarelto approximate 6 months prior to the stroke. She has a history of atrial fibrillation.  Past Medical History  Diagnosis Date  . Paroxysmal a-fib ([place])       . Hypertension   . Transient ischemic attack (TIA) 01/2013  . Encounter for long-term (current) use of other medications   . Heart murmur   . Pneumonia 02/2011; 08/2011; 03/22/2014    Social History   Social History  . Marital Status: Single    Spouse Name: N/A  . Number of Children: N/A  . Years of Education: N/A   Occupational History  . assembly line    Social History Main Topics  . Smoking status: Former Smoker -- 3 years    Types: Cigars    Quit date: 03/15/2013  . Smokeless tobacco: Never Used  . Alcohol Use: 3.6 oz/week    6 Cans of beer per week     Comment: only on weekends  . Drug Use: No  . Sexual Activity: Not Currently    Birth Control/ Protection: None   Other Topics Concern  . Not on file   Social History Narrative    Past Surgical History  Procedure Laterality Date  . Tee without cardioversion N/A 02/18/2013    Procedure: TRANSESOPHAGEAL ECHOCARDIOGRAM (TEE);  Surgeon: Sanda Klein, MD;  Location: [place];  Service: Cardiovascular;  Laterality: N/A;  . Cardioversion N/A 02/18/2013    Procedure: CARDIOVERSION;  Surgeon: Sanda Klein, MD;  Location: [facility] ENDOSCOPY;  Service: Cardiovascular;  Laterality: N/A;  . Cardioversion N/A 02/23/2013    Procedure: CARDIOVERSION;   Surgeon: Leonie Man, MD;  Location: [place];  Service: Cardiovascular;  Laterality: N/A;  BESIDE CV  . Tubal ligation  1992    Family History  Problem Relation Age of Onset  . Hypertension    . Diabetes    . Cancer Mother   . Hypertension Father   . Atrial fibrillation Father   . Peripheral vascular disease Father     Allergies  Allergen Reactions  . Penicillins Hives and Other (See Comments)    Unknown  Has patient had a PCN reaction causing immediate rash, facial/tongue/throat swelling, SOB or lightheadedness with hypotension: No Has patient had a PCN reaction causing severe rash involving mucus membranes or skin necrosis: No Has patient had a PCN reaction that required hospitalization No Has patient had a PCN reaction occurring within the last 10 years: No If all of the above answers are "NO", then may proceed with Cephalosporin use.    Current Outpatient Prescriptions on File Prior to Visit  Medication Sig Dispense Refill  . atorvastatin (LIPITOR) 20 MG tablet Take 1 tablet (20 mg total) by mouth daily at 6 PM. 30 tablet 2  . diltiazem (CARDIZEM LA) 120 MG 24 hr tablet Take 2 tablets (240 mg total) by mouth daily. 30 tablet 2  . metoprolol tartrate (LOPRESSOR) 25 MG tablet Take 0.5 tablets (12.5 mg total) by mouth 2 (two) times daily. 30 tablet 2  .  rivaroxaban (XARELTO) 20 MG TABS tablet Take 1 tablet (20 mg total) by mouth daily with supper. 90 tablet 3   No current facility-administered medications on file prior to visit.     patient denies chest pain, shortness of breath, orthopnea. Denies lower extremity edema, abdominal pain, change in appetite, change in bowel movements. Patient denies rashes, musculoskeletal complaints. No other specific complaints in a complete review of systems.   BP 126/89 mmHg  Pulse 84  Temp(Src) 98.4 F (36.9 C) (Oral)  Resp 16  Ht 5\' 3"  (1.6 m)  Wt 187 lb (84.823 kg)  BMI 33.13 kg/m2  SpO2 96%   Well-developed well-nourished  female in no acute distress. HEENT exam atraumatic, normocephalic, extraocular muscles are intact. Neck is supple. No jugular venous distention no thyromegaly. Chest clear to auscultation without increased work of breathing. Cardiac exam S1 and S2 are regular. Abdominal exam active bowel sounds, soft, nontender. Extremities no edema. Neurologic exam she is alert without any motor sensory deficits. Gait is normal.   Cerebral infarction due to embolism of right middle cerebral artery (HCC) s/p IV tPA Patient with a remarkable recovery after cerebral infarction. She is doing quite well. Had a long discussion with her regarding medication compliance. She understands that she must rivaroxaban for the rest of her life.  She will follow-up here in one month.

## 2015-11-15 NOTE — Assessment & Plan Note (Signed)
Patient with a remarkable recovery after cerebral infarction. She is doing quite well. Had a long discussion with her regarding medication compliance. She understands that she must rivaroxaban for the rest of her life.  She will follow-up here in one month.

## 2015-11-21 MED FILL — CARDIZEM LA 120 MG TABLET: 120 | 15 days supply | Qty: 30 | Fill #1

## 2015-12-06 MED FILL — CARDIZEM LA 120 MG TABLET: 120 | 15 days supply | Qty: 30 | Fill #2

## 2015-12-07 MED FILL — ATORVASTATIN 20 MG TABLET: 20 | 30 days supply | Qty: 30 | Fill #1

## 2015-12-07 MED FILL — **XARELTO 20 MG TABLET: 20 MG | 30 days supply | Qty: 30 | Fill #1

## 2015-12-07 MED FILL — ?HYDROCHLOROTHIAZIDE 12.5MG: 12.5 | 30 days supply | Qty: 30 | Fill #2

## 2015-12-07 MED FILL — ?METOPROLOL 25 MG TABLET: 25 | 30 days supply | Qty: 60 | Fill #2

## 2015-12-07 MED FILL — hydrALAZINE HCL 50 MG TABS: 50 | 30 days supply | Qty: 90 | Fill #2

## 2015-12-13 ENCOUNTER — Ambulatory Visit: Payer: Self-pay | Admitting: Internal Medicine

## 2015-12-24 ENCOUNTER — Ambulatory Visit: Payer: Self-pay | Admitting: Neurology

## 2015-12-26 ENCOUNTER — Other Ambulatory Visit: Payer: Self-pay | Admitting: Internal Medicine

## 2015-12-26 MED ORDER — DILTIAZEM HCL ER COATED BEADS 120 MG PO TB24: 240.0000 mg | ORAL_TABLET | Freq: Every day | ORAL | Status: DC

## 2015-12-26 MED FILL — CARDIZEM LA 120 MG TABLET: 120 | 30 days supply | Qty: 60 | Fill #0

## 2016-01-09 MED FILL — HYDROCHLOROTHIAZIDE 12.5 MG: 12.5 | 30 days supply | Qty: 30 | Fill #3

## 2016-01-16 MED FILL — METOPROLOL TARTRATE 25 MG T: 25 | 30 days supply | Qty: 60 | Fill #3

## 2016-01-16 MED FILL — ATORVASTATIN 20 MG TABLET: 20 | 30 days supply | Qty: 30 | Fill #2

## 2016-01-31 ENCOUNTER — Emergency Department (HOSPITAL_COMMUNITY)
Admission: EM | Admit: 2016-01-31 | Discharge: 2016-01-31 | Disposition: A | Discharge status: Home / Self Care | Payer: BC Managed Care – PPO | Attending: Emergency Medicine | Admitting: Emergency Medicine

## 2016-01-31 ENCOUNTER — Encounter (HOSPITAL_COMMUNITY): Payer: Self-pay | Admitting: Emergency Medicine

## 2016-01-31 DIAGNOSIS — Z8673 Personal history of transient ischemic attack (TIA), and cerebral infarction without residual deficits: Secondary | ICD-10-CM | POA: Diagnosis not present

## 2016-01-31 DIAGNOSIS — Z87891 Personal history of nicotine dependence: Secondary | ICD-10-CM | POA: Insufficient documentation

## 2016-01-31 DIAGNOSIS — M7989 Other specified soft tissue disorders: Secondary | ICD-10-CM | POA: Diagnosis not present

## 2016-01-31 DIAGNOSIS — I480 Paroxysmal atrial fibrillation: Secondary | ICD-10-CM | POA: Insufficient documentation

## 2016-01-31 DIAGNOSIS — R220 Localized swelling, mass and lump, head: Secondary | ICD-10-CM | POA: Insufficient documentation

## 2016-01-31 DIAGNOSIS — R609 Edema, unspecified: Secondary | ICD-10-CM

## 2016-01-31 DIAGNOSIS — Z79899 Other long term (current) drug therapy: Secondary | ICD-10-CM | POA: Diagnosis not present

## 2016-01-31 DIAGNOSIS — I10 Essential (primary) hypertension: Secondary | ICD-10-CM | POA: Insufficient documentation

## 2016-01-31 DIAGNOSIS — R202 Paresthesia of skin: Secondary | ICD-10-CM | POA: Insufficient documentation

## 2016-01-31 HISTORY — DX: Cerebral infarction, unspecified: I63.9

## 2016-01-31 LAB — CBC WITH DIFFERENTIAL/PLATELET
BASOS ABS: 0 10*3/uL (ref 0.0–0.1)
BASOS PCT: 0 %
Eosinophils Absolute: 0.2 10*3/uL (ref 0.0–0.7)
Eosinophils Relative: 1 %
HEMATOCRIT: 37.6 % (ref 36.0–46.0)
HEMOGLOBIN: 11.9 g/dL — AB (ref 12.0–15.0)
Lymphocytes Relative: 19 %
Lymphs Abs: 2.1 10*3/uL (ref 0.7–4.0)
MCH: 25.9 pg — ABNORMAL LOW (ref 26.0–34.0)
MCHC: 31.6 g/dL (ref 30.0–36.0)
MCV: 81.9 fL (ref 78.0–100.0)
MONOS PCT: 6 %
Monocytes Absolute: 0.6 10*3/uL (ref 0.1–1.0)
NEUTROS ABS: 8 10*3/uL — AB (ref 1.7–7.7)
NEUTROS PCT: 74 %
Platelets: 169 10*3/uL (ref 150–400)
RBC: 4.59 MIL/uL (ref 3.87–5.11)
RDW: 16.8 % — ABNORMAL HIGH (ref 11.5–15.5)
WBC: 10.9 10*3/uL — ABNORMAL HIGH (ref 4.0–10.5)

## 2016-01-31 LAB — COMPREHENSIVE METABOLIC PANEL
ALBUMIN: 3.3 g/dL — AB (ref 3.5–5.0)
ALT: 27 U/L (ref 14–54)
AST: 25 U/L (ref 15–41)
Alkaline Phosphatase: 89 U/L (ref 38–126)
Anion gap: 11 (ref 5–15)
BILIRUBIN TOTAL: 0.8 mg/dL (ref 0.3–1.2)
BUN: 10 mg/dL (ref 6–20)
CALCIUM: 9.3 mg/dL (ref 8.9–10.3)
CO2: 24 mmol/L (ref 22–32)
Chloride: 106 mmol/L (ref 101–111)
Creatinine, Ser: 0.94 mg/dL (ref 0.44–1.00)
GFR calc Af Amer: >60 mL/min (ref >60)
GFR calc non Af Amer: >60 mL/min (ref >60)
GLUCOSE: 122 mg/dL — AB (ref 65–99)
Potassium: 3.7 mmol/L (ref 3.5–5.1)
Sodium: 141 mmol/L (ref 135–145)
TOTAL PROTEIN: 7.4 g/dL (ref 6.5–8.1)

## 2016-01-31 NOTE — ED Provider Notes (Signed)
CSN: [id number]     Arrival date & time 01/31/16  0520 History   First MD Initiated Contact with Patient 01/31/16 0601     Chief Complaint  Patient presents with  . Facial Swelling  . Tingling     (Consider location/radiation/quality/duration/timing/severity/associated sxs/prior Treatment) HPI [name] is a 50 y.o. female with PMH significant for paroxysmal AF on Xarelto, HTN, CVA in no deficients Feb 2017 secondary to medical non-compliance with AFib who presents with intermittent swelling and tingling to her face, bilateral arms, and bilateral feet.  Patient reports that this seems to have started in February after her stroke.  It does not seem to be associated with anything in particular.  She reports "the swelling just comes up out of nowhere and then it will go away after a while".  It is not worse at any particular time of day.  She has not tried anything for her symptoms.  No modifying/aggravating factors.  She reports she has been trying to get in with her PCP, but does not have an appointment until August.  She states that this morning approximately 4:45 am she woke up and noticed the right side of her face was puffy/swelling around her right eye/forehead/cheek and she had some right arm tingling (she points to her deltoid) which prompted her visit this morning.  These both have since resolved.  She states that since the stroke she feels like she has more headaches and numbness in her bilateral feet, arms, and face, but this is not acutely new today.  Denies fever, chills, facial droop, slurred speech, CP, cough, SOB, weakness, N/V/D, abdominal pain, or urinary symptoms.  Past Medical History  Diagnosis Date  . Paroxysmal a-fib ([place])   . Shortness of breath   . Hypertension   . Transient ischemic attack (TIA) 01/2013  . Encounter for long-term (current) use of other medications   . Heart murmur   . Pneumonia 02/2011; 08/2011; 03/22/2014  . Stroke [facility])     of mid right middle  cerebral atery - recieved TPA   Past Surgical History  Procedure Laterality Date  . Tee without cardioversion N/A 02/18/2013    Procedure: TRANSESOPHAGEAL ECHOCARDIOGRAM (TEE);  Surgeon: Sanda Klein, MD;  Location: [facility];  Service: Cardiovascular;  Laterality: N/A;  . Cardioversion N/A 02/18/2013    Procedure: CARDIOVERSION;  Surgeon: Sanda Klein, MD;  Location: [facility] ENDOSCOPY;  Service: Cardiovascular;  Laterality: N/A;  . Cardioversion N/A 02/23/2013    Procedure: CARDIOVERSION;  Surgeon: Leonie Man, MD;  Location: [place];  Service: Cardiovascular;  Laterality: N/A;  BESIDE CV  . Tubal ligation  1992   Family History  Problem Relation Age of Onset  . Hypertension    . Diabetes    . Cancer Mother   . Hypertension Father   . Atrial fibrillation Father   . Peripheral vascular disease Father    Social History  Substance Use Topics  . Smoking status: Former Smoker -- 3 years    Types: Cigars    Quit date: 03/15/2013  . Smokeless tobacco: Never Used  . Alcohol Use: 3.6 oz/week    6 Cans of beer per week     Comment: only on weekends   OB History    No data available     Review of Systems All other systems negative unless otherwise stated in HPI    Allergies  Penicillins  Home Medications   Prior to Admission medications   Medication Sig Start Date End Date  Taking? Authorizing Provider  atorvastatin (LIPITOR) 20 MG tablet Take 1 tablet (20 mg total) by mouth daily at 6 PM. 11/06/15  Yes Donzetta Starch, NP  diltiazem (CARDIZEM LA) 120 MG 24 hr tablet Take 2 tablets (240 mg total) by mouth daily. 12/26/15  Yes Tresa Garter, MD  metoprolol tartrate (LOPRESSOR) 25 MG tablet Take 0.5 tablets (12.5 mg total) by mouth 2 (two) times daily. 11/06/15  Yes Donzetta Starch, NP  rivaroxaban (XARELTO) 20 MG TABS tablet Take 1 tablet (20 mg total) by mouth daily with supper. 11/13/15  Yes Donzetta Starch, NP   BP 152/82 mmHg  Pulse 47  Temp(Src) 98.4 F (36.9 C) (Oral)   Resp 20  Ht 5\' 3"  (1.6 m)  SpO2 98% Physical Exam  Constitutional: She is oriented to person, place, and time. She appears well-developed and well-nourished.  Non-toxic appearance. She does not have a sickly appearance. She does not appear ill.  HENT:  Head: Normocephalic and atraumatic.  Right Ear: External ear normal.  Left Ear: External ear normal.  Nose: Nose normal.  Mouth/Throat: Oropharynx is clear and moist.  No facial erythema, warmth, or swelling. No evidence of angioedema or compromised airway.   Eyes: Conjunctivae and EOM are normal. Pupils are equal, round, and reactive to light. Right eye exhibits no discharge. Left eye exhibits no discharge.  Neck: Normal range of motion. Neck supple.  Cardiovascular: Normal rate and regular rhythm.   Pulmonary/Chest: Effort normal and breath sounds normal. No accessory muscle usage or stridor. No respiratory distress. She has no wheezes. She has no rhonchi. She has no rales.  Abdominal: Soft. Bowel sounds are normal. She exhibits no distension. There is no tenderness.  Musculoskeletal: Normal range of motion. She exhibits no edema or tenderness.  Normal ROM without pain.  No erythema, warmth, or swelling.   Lymphadenopathy:    She has no cervical adenopathy.  Neurological: She is alert and oriented to person, place, and time.  Mental Status:   AOx3.  Speech clear without dysarthria. Cranial Nerves:  I-not tested  II-PERRLA  III, IV, VI-EOMs intact  V-temporal and masseter strength intact  VII-symmetrical facial movements intact, no facial droop  VIII-hearing grossly intact bilaterally  IX, X-gag intact  XI-strength of sternomastoid and trapezius muscles 5/5  XII-tongue midline Motor:   Good muscle bulk and tone  Strength 5/5 bilaterally in upper and lower extremities   Cerebellar--intact RAMs, finger to nose intact bilaterally.   No pronator drift Sensory:  Intact in upper and lower extremities   Skin: Skin is warm and dry.   Psychiatric: She has a normal mood and affect. Her behavior is normal.    ED Course  Procedures (including critical care time) Labs Review Labs Reviewed  CBC WITH DIFFERENTIAL/PLATELET - Abnormal; Notable for the following:    WBC 10.9 (*)    Hemoglobin 11.9 (*)    MCH 25.9 (*)    RDW 16.8 (*)    Neutro Abs 8.0 (*)    All other components within normal limits  COMPREHENSIVE METABOLIC PANEL - Abnormal; Notable for the following:    Glucose, Bld 122 (*)    Albumin 3.3 (*)    All other components within normal limits    Imaging Review No results found. I have personally reviewed and evaluated these images and lab results as part of my medical decision-making.   EKG Interpretation   Date/Time:  Thursday Jan 31 2016 05:38:56 EDT Ventricular Rate:  50 PR Interval:  142 QRS Duration: 102 QT Interval:  621 QTC Calculation: 566 R Axis:   -58 Text Interpretation:  Sinus rhythm LAD, consider left anterior fascicular  block Nonspecific T abnormalities, lateral leads Prolonged QT interval No  significant change since 2012 Confirmed by WARD,  DO, KRISTEN [id number]) on  01/31/2016 5:41:40 AM      MDM   Final diagnoses:  Tingling  Swelling   Patient presents with intermittent tingling and swelling to bilateral arms, feet, and face.  Woke today with right facial swelling that has since resolved.  No pain.  VSS, NAD.  On exam, no facial swelling or evidence of angioedema.  Normal neuro exam.  Labs without acute abnormalities.  Previous brain MR without evidence of MS.  Doubt CVA, TIA, acute neurologic emergency, anaphylaxis.  Will give outpatient neurology follow up.  Evaluation does not show pathology requiring ongoing emergent intervention or admission. Pt is hemodynamically stable and mentating appropriately. Discussed findings/results and plan with patient/guardian, who agrees with plan. All questions answered. Return precautions discussed and outpatient follow up given.   Case has  been discussed with and seen by Dr. Leonides Schanz who agrees with the above plan for discharge.     Gloriann Loan, PA-C 01/31/16 [place], DO 01/31/16 [phone]

## 2016-01-31 NOTE — Discharge Instructions (Signed)
Your labs today are normal.  Your neurologic exam is normal.  I do not think this is a stroke or neurologic emergency.  Please follow up with Dr. Doreene Burke.  You have also been given a follow up with neurology.  They will call you to schedule an appointment.   Paresthesia Paresthesia is an abnormal burning or prickling sensation. This sensation is generally felt in the hands, arms, legs, or feet. However, it may occur in any part of the body. Usually, it is not painful. The feeling may be described as:  Tingling or numbness.  Pins and needles.  Skin crawling.  Buzzing.  Limbs falling asleep.  Itching. Most people experience temporary (transient) paresthesia at some time in their lives. Paresthesia may occur when you breathe too quickly (hyperventilation). It can also occur without any apparent cause. Commonly, paresthesia occurs when pressure is placed on a nerve. The sensation quickly goes away after the pressure is removed. For some people, however, paresthesia is a long-lasting (chronic) condition that is caused by an underlying disorder. If you continue to have paresthesia, you may need further medical evaluation. HOME CARE INSTRUCTIONS Watch your condition for any changes. Taking the following actions may help to lessen any discomfort that you are feeling:  Avoid drinking alcohol.  Try acupuncture or massage to help relieve your symptoms.  Keep all follow-up visits as directed by your health care provider. This is important. SEEK MEDICAL CARE IF:  You continue to have episodes of paresthesia.  Your burning or prickling feeling gets worse when you walk.  You have pain, cramps, or dizziness.  You develop a rash. SEEK IMMEDIATE MEDICAL CARE IF:  You feel weak.  You have trouble walking or moving.  You have problems with speech, understanding, or vision.  You feel confused.  You cannot control your bladder or bowel movements.  You have numbness after an injury.  You  faint.   This information is not intended to replace advice given to you by your health care provider. Make sure you discuss any questions you have with your health care provider.   Document Released: 08/22/2002 Document Revised: 01/16/2015 Document Reviewed: 08/28/2014 Elsevier Interactive Patient Education [employer].

## 2016-01-31 NOTE — ED Notes (Signed)
Pt arrives from home with c/o R sided facial swelling and tingling in R arm onset when she woke up this morning, felt fine when going to bed at 2300 last night. Hx two strokes. Denies pain. Neuro check in triage negative.

## 2016-02-01 MED FILL — CARDIZEM LA 120 MG TABLET: 120 | 30 days supply | Qty: 60 | Fill #1

## 2016-02-08 MED FILL — XARELTO 20 MG TABLET: 20 | 30 days supply | Qty: 30 | Fill #2

## 2016-02-12 MED FILL — predniSONE 20 MG TABS: 20 | 5 days supply | Qty: 10 | Fill #0

## 2016-02-13 ENCOUNTER — Other Ambulatory Visit: Payer: Self-pay

## 2016-02-13 NOTE — Patient Outreach (Signed)
First telephone outreach to patient to obtain mRS. No answer from contact number provided in chart. Left message for return call.   Jacqulynn Cadet  [facility] Care Management Assistant

## 2016-02-14 ENCOUNTER — Other Ambulatory Visit: Payer: Self-pay

## 2016-02-14 MED FILL — METOPROLOL TARTRATE 25 MG T: 25 | 30 days supply | Qty: 60 | Fill #4

## 2016-02-14 NOTE — Patient Outreach (Signed)
Second telephone outreach attempt to obtain mRS. Left message for return call.    [name]  [facility] Care Management Assistant

## 2016-02-15 ENCOUNTER — Other Ambulatory Visit: Payer: Self-pay

## 2016-02-15 NOTE — Patient Outreach (Signed)
Third telephone outreach to patient to obtain mRS. No answer, left message for return call.   [name]  [facility] Care Management Assistant

## 2016-02-19 NOTE — Patient Outreach (Signed)
3 telephone outreach attempts were completed to obtain mRS for patient. mRS could not be obtained because messages left for the patient requesting a return phone call but no return call received.   Jacqulynn Cadet  [facility] Care Management Assistant

## 2016-02-21 ENCOUNTER — Telehealth: Payer: Self-pay | Admitting: Internal Medicine

## 2016-02-21 MED ORDER — ATORVASTATIN CALCIUM 20 MG PO TABS: 20.0000 mg | ORAL_TABLET | Freq: Every day | ORAL | Status: DC

## 2016-02-21 MED FILL — ATORVASTATIN 20 MG TABLET: 20 | 30 days supply | Qty: 30 | Fill #0

## 2016-02-21 NOTE — Telephone Encounter (Signed)
Pt. Called requesting an appointment for the following medication:   atorvastatin (LIPITOR) 20 MG tablet   Please f/u with pt.

## 2016-02-21 NOTE — Telephone Encounter (Signed)
Atorvastatin refilled.  

## 2016-03-07 ENCOUNTER — Encounter: Payer: Self-pay | Admitting: Family Medicine

## 2016-03-07 ENCOUNTER — Encounter: Payer: Self-pay | Admitting: *Deleted

## 2016-03-07 ENCOUNTER — Other Ambulatory Visit (HOSPITAL_COMMUNITY)
Admission: RE | Admit: 2016-03-07 | Discharge: 2016-03-07 | Disposition: A | Discharge status: Home / Self Care | Payer: BC Managed Care – PPO | Source: Ambulatory Visit | Attending: Family Medicine | Admitting: Family Medicine

## 2016-03-07 ENCOUNTER — Ambulatory Visit (INDEPENDENT_AMBULATORY_CARE_PROVIDER_SITE_OTHER): Payer: BC Managed Care – PPO | Admitting: Physician Assistant

## 2016-03-07 ENCOUNTER — Ambulatory Visit: Payer: BC Managed Care – PPO | Attending: Family Medicine | Admitting: Family Medicine

## 2016-03-07 ENCOUNTER — Encounter: Payer: Self-pay | Admitting: Physician Assistant

## 2016-03-07 VITALS — BP 130/89 | HR 141 | Temp 98.1°F | Resp 20 | Ht 63.5 in | Wt 191.2 lb

## 2016-03-07 VITALS — BP 110/80 | HR 123 | Ht 63.5 in | Wt 194.0 lb

## 2016-03-07 DIAGNOSIS — Z124 Encounter for screening for malignant neoplasm of cervix: Secondary | ICD-10-CM

## 2016-03-07 DIAGNOSIS — G44229 Chronic tension-type headache, not intractable: Secondary | ICD-10-CM | POA: Insufficient documentation

## 2016-03-07 DIAGNOSIS — I4891 Unspecified atrial fibrillation: Secondary | ICD-10-CM | POA: Diagnosis not present

## 2016-03-07 DIAGNOSIS — Z1239 Encounter for other screening for malignant neoplasm of breast: Secondary | ICD-10-CM

## 2016-03-07 DIAGNOSIS — I10 Essential (primary) hypertension: Secondary | ICD-10-CM

## 2016-03-07 DIAGNOSIS — Z79899 Other long term (current) drug therapy: Secondary | ICD-10-CM | POA: Insufficient documentation

## 2016-03-07 DIAGNOSIS — I481 Persistent atrial fibrillation: Secondary | ICD-10-CM | POA: Diagnosis not present

## 2016-03-07 DIAGNOSIS — I4892 Unspecified atrial flutter: Secondary | ICD-10-CM | POA: Diagnosis not present

## 2016-03-07 DIAGNOSIS — Z0000 Encounter for general adult medical examination without abnormal findings: Secondary | ICD-10-CM

## 2016-03-07 DIAGNOSIS — R000 Tachycardia, unspecified: Secondary | ICD-10-CM | POA: Diagnosis not present

## 2016-03-07 DIAGNOSIS — Z8673 Personal history of transient ischemic attack (TIA), and cerebral infarction without residual deficits: Secondary | ICD-10-CM

## 2016-03-07 DIAGNOSIS — Z1211 Encounter for screening for malignant neoplasm of colon: Secondary | ICD-10-CM | POA: Insufficient documentation

## 2016-03-07 DIAGNOSIS — Z7901 Long term (current) use of anticoagulants: Secondary | ICD-10-CM | POA: Diagnosis not present

## 2016-03-07 DIAGNOSIS — Z0001 Encounter for general adult medical examination with abnormal findings: Secondary | ICD-10-CM | POA: Diagnosis not present

## 2016-03-07 DIAGNOSIS — A5901 Trichomonal vulvovaginitis: Secondary | ICD-10-CM | POA: Insufficient documentation

## 2016-03-07 DIAGNOSIS — I4819 Other persistent atrial fibrillation: Secondary | ICD-10-CM

## 2016-03-07 LAB — CBC
HCT: 40.8 % (ref 35.0–45.0)
HEMOGLOBIN: 13.2 g/dL (ref 11.7–15.5)
MCH: 26.8 pg — AB (ref 27.0–33.0)
MCHC: 32.4 g/dL (ref 32.0–36.0)
MCV: 82.8 fL (ref 80.0–100.0)
MPV: 11.8 fL (ref 7.5–12.5)
Platelets: 222 10*3/uL (ref 140–400)
RBC: 4.93 MIL/uL (ref 3.80–5.10)
RDW: 16.6 % — ABNORMAL HIGH (ref 11.0–15.0)
WBC: 9.2 10*3/uL (ref 3.8–10.8)

## 2016-03-07 LAB — BASIC METABOLIC PANEL
BUN: 12 mg/dL (ref 7–25)
CALCIUM: 9.5 mg/dL (ref 8.6–10.4)
CO2: 25 mmol/L (ref 20–31)
CREATININE: 0.88 mg/dL (ref 0.50–1.05)
Chloride: 103 mmol/L (ref 98–110)
Glucose, Bld: 90 mg/dL (ref 65–99)
Potassium: 3.8 mmol/L (ref 3.5–5.3)
Sodium: 140 mmol/L (ref 135–146)

## 2016-03-07 LAB — TSH: TSH: 1.33 m[IU]/L

## 2016-03-07 MED ORDER — METOPROLOL TARTRATE 25 MG PO TABS: 25.0000 mg | ORAL_TABLET | Freq: Two times a day (BID) | ORAL | Status: DC

## 2016-03-07 MED ORDER — DILTIAZEM HCL ER COATED BEADS 120 MG PO TB24: 240.0000 mg | ORAL_TABLET | Freq: Every day | ORAL | Status: DC

## 2016-03-07 MED ORDER — RIVAROXABAN 20 MG PO TABS: 20.0000 mg | ORAL_TABLET | Freq: Every day | ORAL | Status: DC

## 2016-03-07 MED FILL — CARDIZEM LA 120 MG TABLET: 120 | 30 days supply | Qty: 60 | Fill #0

## 2016-03-07 MED FILL — XARELTO 20 MG TABLET: 20 | 30 days supply | Qty: 30 | Fill #0

## 2016-03-07 NOTE — Progress Notes (Signed)
Hand swelling- stopped taking HCTZ 3/17 Headaches 3-4 times weekly since stroke 3/17 tachycardia

## 2016-03-07 NOTE — Patient Instructions (Signed)
Health Maintenance, Female Adopting a healthy lifestyle and getting preventive care can go a long way to promote health and wellness. Talk with your health care provider about what schedule of regular examinations is right for you. This is a good chance for you to check in with your provider about disease prevention and staying healthy. In between checkups, there are plenty of things you can do on your own. Experts have done a lot of research about which lifestyle changes and preventive measures are most likely to keep you healthy. Ask your health care provider for more information. WEIGHT AND DIET  Eat a healthy diet  Be sure to include plenty of vegetables, fruits, low-fat dairy products, and lean protein.  Do not eat a lot of foods high in solid fats, added sugars, or salt.  Get regular exercise. This is one of the most important things you can do for your health.  Most adults should exercise for at least 150 minutes each week. The exercise should increase your heart rate and make you sweat (moderate-intensity exercise).  Most adults should also do strengthening exercises at least twice a week. This is in addition to the moderate-intensity exercise.  Maintain a healthy weight  Body mass index (BMI) is a measurement that can be used to identify possible weight problems. It estimates body fat based on height and weight. Your health care provider can help determine your BMI and help you achieve or maintain a healthy weight.  For females 28 years of age and older:   A BMI below 18.5 is considered underweight.  A BMI of 18.5 to 24.9 is normal.  A BMI of 25 to 29.9 is considered overweight.  A BMI of 30 and above is considered obese.  Watch levels of cholesterol and blood lipids  You should start having your blood tested for lipids and cholesterol at 52 years of age, then have this test every 5 years.  You may need to have your cholesterol levels checked more often if:  Your lipid  or cholesterol levels are high.  You are older than 52 years of age.  You are at high risk for heart disease.  CANCER SCREENING   Lung Cancer  Lung cancer screening is recommended for adults 75-66 years old who are at high risk for lung cancer because of a history of smoking.  A yearly low-dose CT scan of the lungs is recommended for people who:  Currently smoke.  Have quit within the past 15 years.  Have at least a 30-pack-year history of smoking. A pack year is smoking an average of one pack of cigarettes a day for 1 year.  Yearly screening should continue until it has been 15 years since you quit.  Yearly screening should stop if you develop a health problem that would prevent you from having lung cancer treatment.  Breast Cancer  Practice breast self-awareness. This means understanding how your breasts normally appear and feel.  It also means doing regular breast self-exams. Let your health care provider know about any changes, no matter how small.  If you are in your 20s or 30s, you should have a clinical breast exam (CBE) by a health care provider every 1-3 years as part of a regular health exam.  If you are 25 or older, have a CBE every year. Also consider having a breast X-ray (mammogram) every year.  If you have a family history of breast cancer, talk to your health care provider about genetic screening.  If you  are at high risk for breast cancer, talk to your health care provider about having an MRI and a mammogram every year.  Breast cancer gene (BRCA) assessment is recommended for women who have family members with BRCA-related cancers. BRCA-related cancers include:  Breast.  Ovarian.  Tubal.  Peritoneal cancers.  Results of the assessment will determine the need for genetic counseling and BRCA1 and BRCA2 testing. Cervical Cancer Your health care provider may recommend that you be screened regularly for cancer of the pelvic organs (ovaries, uterus, and  vagina). This screening involves a pelvic examination, including checking for microscopic changes to the surface of your cervix (Pap test). You may be encouraged to have this screening done every 3 years, beginning at age 21.  For women ages 30-65, health care providers may recommend pelvic exams and Pap testing every 3 years, or they may recommend the Pap and pelvic exam, combined with testing for human papilloma virus (HPV), every 5 years. Some types of HPV increase your risk of cervical cancer. Testing for HPV may also be done on women of any age with unclear Pap test results.  Other health care providers may not recommend any screening for nonpregnant women who are considered low risk for pelvic cancer and who do not have symptoms. Ask your health care provider if a screening pelvic exam is right for you.  If you have had past treatment for cervical cancer or a condition that could lead to cancer, you need Pap tests and screening for cancer for at least 20 years after your treatment. If Pap tests have been discontinued, your risk factors (such as having a new sexual partner) need to be reassessed to determine if screening should resume. Some women have medical problems that increase the chance of getting cervical cancer. In these cases, your health care provider may recommend more frequent screening and Pap tests. Colorectal Cancer  This type of cancer can be detected and often prevented.  Routine colorectal cancer screening usually begins at 52 years of age and continues through 52 years of age.  Your health care provider may recommend screening at an earlier age if you have risk factors for colon cancer.  Your health care provider may also recommend using home test kits to check for hidden blood in the stool.  A small camera at the end of a tube can be used to examine your colon directly (sigmoidoscopy or colonoscopy). This is done to check for the earliest forms of colorectal  cancer.  Routine screening usually begins at age 50.  Direct examination of the colon should be repeated every 5-10 years through 52 years of age. However, you may need to be screened more often if early forms of precancerous polyps or small growths are found. Skin Cancer  Check your skin from head to toe regularly.  Tell your health care provider about any new moles or changes in moles, especially if there is a change in a mole's shape or color.  Also tell your health care provider if you have a mole that is larger than the size of a pencil eraser.  Always use sunscreen. Apply sunscreen liberally and repeatedly throughout the day.  Protect yourself by wearing long sleeves, pants, a wide-brimmed hat, and sunglasses whenever you are outside. HEART DISEASE, DIABETES, AND HIGH BLOOD PRESSURE   High blood pressure causes heart disease and increases the risk of stroke. High blood pressure is more likely to develop in:  People who have blood pressure in the high end   of the normal range (130-139/85-89 mm Hg).  People who are overweight or obese.  People who are African American.  If you are 38-23 years of age, have your blood pressure checked every 3-5 years. If you are 61 years of age or older, have your blood pressure checked every year. You should have your blood pressure measured twice--once when you are at a hospital or clinic, and once when you are not at a hospital or clinic. Record the average of the two measurements. To check your blood pressure when you are not at a hospital or clinic, you can use:  An automated blood pressure machine at a pharmacy.  A home blood pressure monitor.  If you are between 45 years and 39 years old, ask your health care provider if you should take aspirin to prevent strokes.  Have regular diabetes screenings. This involves taking a blood sample to check your fasting blood sugar level.  If you are at a normal weight and have a low risk for diabetes,  have this test once every three years after 52 years of age.  If you are overweight and have a high risk for diabetes, consider being tested at a younger age or more often. PREVENTING INFECTION  Hepatitis B  If you have a higher risk for hepatitis B, you should be screened for this virus. You are considered at high risk for hepatitis B if:  You were born in a country where hepatitis B is common. Ask your health care provider which countries are considered high risk.  Your parents were born in a high-risk country, and you have not been immunized against hepatitis B (hepatitis B vaccine).  You have HIV or AIDS.  You use needles to inject street drugs.  You live with someone who has hepatitis B.  You have had sex with someone who has hepatitis B.  You get hemodialysis treatment.  You take certain medicines for conditions, including cancer, organ transplantation, and autoimmune conditions. Hepatitis C  Blood testing is recommended for:  Everyone born from [birth year] through 1967.  Anyone with known risk factors for hepatitis C. Sexually transmitted infections (STIs)  You should be screened for sexually transmitted infections (STIs) including gonorrhea and chlamydia if:  You are sexually active and are younger than 52 years of age.  You are older than 53 years of age and your health care provider tells you that you are at risk for this type of infection.  Your sexual activity has changed since you were last screened and you are at an increased risk for chlamydia or gonorrhea. Ask your health care provider if you are at risk.  If you do not have HIV, but are at risk, it may be recommended that you take a prescription medicine daily to prevent HIV infection. This is called pre-exposure prophylaxis (PrEP). You are considered at risk if:  You are sexually active and do not regularly use condoms or know the HIV status of your partner(s).  You take drugs by injection.  You are sexually  active with a partner who has HIV. Talk with your health care provider about whether you are at high risk of being infected with HIV. If you choose to begin PrEP, you should first be tested for HIV. You should then be tested every 3 months for as long as you are taking PrEP.  PREGNANCY   If you are premenopausal and you may become pregnant, ask your health care provider about preconception counseling.  If you may  become pregnant, take 400 to 800 micrograms (mcg) of folic acid every day.  If you want to prevent pregnancy, talk to your health care provider about birth control (contraception). OSTEOPOROSIS AND MENOPAUSE   Osteoporosis is a disease in which the bones lose minerals and strength with aging. This can result in serious bone fractures. Your risk for osteoporosis can be identified using a bone density scan.  If you are 61 years of age or older, or if you are at risk for osteoporosis and fractures, ask your health care provider if you should be screened.  Ask your health care provider whether you should take a calcium or vitamin D supplement to lower your risk for osteoporosis.  Menopause may have certain physical symptoms and risks.  Hormone replacement therapy may reduce some of these symptoms and risks. Talk to your health care provider about whether hormone replacement therapy is right for you.  HOME CARE INSTRUCTIONS   Schedule regular health, dental, and eye exams.  Stay current with your immunizations.   Do not use any tobacco products including cigarettes, chewing tobacco, or electronic cigarettes.  If you are pregnant, do not drink alcohol.  If you are breastfeeding, limit how much and how often you drink alcohol.  Limit alcohol intake to no more than 1 drink per day for nonpregnant women. One drink equals 12 ounces of beer, 5 ounces of wine, or 1 ounces of hard liquor.  Do not use street drugs.  Do not share needles.  Ask your health care provider for help if  you need support or information about quitting drugs.  Tell your health care provider if you often feel depressed.  Tell your health care provider if you have ever been abused or do not feel safe at home.   This information is not intended to replace advice given to you by your health care provider. Make sure you discuss any questions you have with your health care provider.   Document Released: 03/17/2011 Document Revised: 09/22/2014 Document Reviewed: 08/03/2013 Elsevier Interactive Patient Education [employer].

## 2016-03-07 NOTE — Progress Notes (Signed)
Cardiology Office Note    Date:  03/07/2016   ID:  [name], DOB 04-11-1966, MRN [id number]  PCP:  Arnoldo Morale, MD  Cardiologist:  Dr. Henderson Cloud. [place]  Chief Complaint: Afib  History of Present Illness:   [name] is a 50 y.o. female with a past medical history significant for HTN, recurrent persistent atrial fibrillation and atrial flutter, hx of non compliance, COPD,  TIA and recent admission [id number] for embolic stroke who added to schedule for afib.   Has a previous TIA and long history of noncompliance with meds and follow up. Xarelto prescribed at DC from hospital Jan 2016 and refilled by PCP July 2016.[name] She stopped it around Aug 2016  after seeing scary ads about Xarelto complications on TV.  She was admitted to hospital [id number] for acute embolic right hemispheric stroke with severe deficits s/p IV tPA while off anticoagulation. She did not require an embolectomy. Post tPA administration MRI showed small amount of asymptomatic hemorrhagic transformation and xaralto held in hospital. Plan want to aspirin 1 week, then resume Xarelto. Amiodarone given for afib with RVR.  She has NSVT during admission and BB was added.   The patient was in sinus rhythm per review of EKG 01/31/16 in ED for intermittent tingling and swelling to bilateral arms, feet, and face.  She was at her PCP office ([place]) today for complete physical where she found to be tachycardic with heart rate of 140 on her vitals. Irregular rhythm on exam thus added to [facility] schedule for further evaluation. EKG showed afib. She is completely asymptomatic. The patient denies nausea, vomiting, fever, chest pain, palpitations, shortness of breath, orthopnea, PND, dizziness, syncope, cough, congestion, abdominal pain, hematochezia, melena, lower extremity edema. Compliant with medications.    Past Medical History  Diagnosis Date  . Paroxysmal a-fib ([place])   . Shortness of  breath   . Hypertension   . Transient ischemic attack (TIA) 01/2013  . Encounter for long-term (current) use of other medications   . Heart murmur   . Pneumonia 02/2011; 08/2011; 03/22/2014  . Stroke [facility])     of mid right middle cerebral atery - recieved TPA    Past Surgical History  Procedure Laterality Date  . Tee without cardioversion N/A 02/18/2013    Procedure: TRANSESOPHAGEAL ECHOCARDIOGRAM (TEE);  Surgeon: Sanda Klein, MD;  Location: [place];  Service: Cardiovascular;  Laterality: N/A;  . Cardioversion N/A 02/18/2013    Procedure: CARDIOVERSION;  Surgeon: Sanda Klein, MD;  Location[facility] ENDOSCOPY;  Service: Cardiovascular;  Laterality: N/A;  . Cardioversion N/A 02/23/2013    Procedure: CARDIOVERSION;  Surgeon: Leonie Man, MD;  Location: [place];  Service: Cardiovascular;  Laterality: N/A;  BESIDE CV  . Tubal ligation  1992    Current Medications: Prior to Admission medications   Medication Sig Start Date End Date Taking? Authorizing Provider  atorvastatin (LIPITOR) 20 MG tablet Take 1 tablet (20 mg total) by mouth daily at 6 PM. 02/21/16   Tresa Garter, MD  diltiazem (CARDIZEM LA) 120 MG 24 hr tablet Take 2 tablets (240 mg total) by mouth daily. 12/26/15   Tresa Garter, MD  metoprolol tartrate (LOPRESSOR) 25 MG tablet Take 0.5 tablets (12.5 mg total) by mouth 2 (two) times daily. 11/06/15   Donzetta Starch, NP  rivaroxaban (XARELTO) 20 MG TABS tablet Take 1 tablet (20 mg total) by mouth daily with supper. 11/13/15   Donzetta Starch, NP  Allergies:   Penicillins   Social History   Social History  . Marital Status: Single    Spouse Name: N/A  . Number of Children: N/A  . Years of Education: N/A   Occupational History  . assembly line    Social History Main Topics  . Smoking status: Former Smoker -- 3 years    Types: Cigars    Quit date: 03/15/2013  . Smokeless tobacco: Never Used  . Alcohol Use: 1.2 - 1.8 oz/week    2-3 Glasses of wine per week      Comment: only on weekends  . Drug Use: No  . Sexual Activity: Not Currently    Birth Control/ Protection: None   Other Topics Concern  . None   Social History Narrative     Family History:  The patient's family history includes Atrial fibrillation in her father and mother; Cancer in her mother; Hypertension in her father; Peripheral vascular disease in her father. There is no history of Heart attack or Stroke.   ROS:   Please see the history of present illness.    ROS All other systems reviewed and are negative.   PHYSICAL EXAM:   VS:  BP 110/80 mmHg  Pulse 123  Ht 5' 3.5" (1.613 m)  Wt 194 lb (87.998 kg)  BMI 33.82 kg/m2   GEN: Well nourished, well developed, in no acute distress HEENT: normal Neck: no JVD, carotid bruits, or masses Cardiac: Ir Ir tachycardiac; no murmurs, rubs, or gallops,no edema  Respiratory:  clear to auscultation bilaterally, normal work of breathing GI: soft, nontender, nondistended, + BS MS: no deformity or atrophy Skin: warm and dry, no rash Neuro:  Alert and Oriented x 3, Strength and sensation are intact Psych: euthymic mood, full affect  Wt Readings from Last 3 Encounters:  03/07/16 194 lb (87.998 kg)  03/07/16 191 lb 3.2 oz (86.728 kg)  11/14/15 187 lb (84.823 kg)      Studies/Labs Reviewed:   EKG:  EKG is ordered today.  The ekg ordered today demonstrates afib at rate of 123 bpm.   Recent Labs: 03/22/2015: TSH 1.938 11/03/2015: Magnesium 1.8 01/31/2016: ALT 27; BUN 10; Creatinine, Ser 0.94; Hemoglobin 11.9*; Platelets 169; Potassium 3.7; Sodium 141   Lipid Panel    Component Value Date/Time   CHOL 131 11/03/2015 0405   TRIG 48 11/03/2015 0405   HDL 32* 11/03/2015 0405   CHOLHDL 4.1 11/03/2015 0405   VLDL 10 11/03/2015 0405   LDLCALC 89 11/03/2015 0405    Additional studies/ records that were reviewed today include:   Echocardiogram: 11/03/15  LV EF: 60% -  65%  ------------------------------------------------------------------- Indications: CVA 436.  ------------------------------------------------------------------- History: PMH: Chest pain. Atrial flutter. Risk factors: Former tobacco use. Hypertension.  ------------------------------------------------------------------- Study Conclusions  - Left ventricle: The cavity size was normal. Wall thickness was  increased in a pattern of moderate LVH. Systolic function was  normal. The estimated ejection fraction was in the range of 60%  to 65%. Wall motion was normal; there were no regional wall  motion abnormalities. - Aortic valve: There was mild regurgitation. Valve area (VTI):  2.33 cm^2. Valve area (Vmax): 2.45 cm^2. - Mitral valve: There was mild regurgitation. - Left atrium: The atrium was severely dilated. - Right atrium: The atrium was severely dilated. - Atrial septum: No defect or patent foramen ovale was identified. - Pulmonary arteries: Systolic pressure was mildly increased. PA  peak pressure: 35 mm Hg (S). - Technically adequate study.    ASSESSMENT &  PLAN:    1. Recurrent PAF - CHADSVasc score 4.Presents today with afib RVR. Compliant with Xarelto. She is asymptomatic. Unable to tell when she goes into afib. She was in sinus rhythm 01/31/16 when seen in ER. Unknown etiology. Will increase BB. Plan for DCCV next week with EKG in clinic day prior. F/u 1 week post DCCV. If shes goes in and out of afib in future, consider EP evaluation for antiarrhythmic vs ablation.   2. Hypertensive heart disease - BP stable and well controlled today.Continue CCB and BB.   3. Hx of Embolic stroke which off anticoagulation - She has appointment with neurology in few weeks.   4. HL - 11/03/2015: Cholesterol 131; HDL 32*; LDL Cholesterol 89; Triglycerides 48; VLDL 10  - Continue statin   Medication Adjustments/Labs and Tests Ordered: Current medicines are  reviewed at length with the patient today.  Concerns regarding medicines are outlined above.  Medication changes, Labs and Tests ordered today are listed in the Patient Instructions below. Patient Instructions  Medication Instructions:   START TAKING METOPROLOL 25 MG TWICE A DAY   If you need a refill on your cardiac medications before your next appointment, please call your pharmacy.  Labwork: CBC BMET AND TSH  TODAY    Testing/Procedures:  SEE LETTER FOR CARDIOVERSION 03/12/2016.Marland KitchenWITH DR [facility]    Follow-Up: ON 03/11/2016  ... NURSE VISIT OR PHARM D FOR AN EKG FOLLOW UP  THE DAY BEFORE CARDIOVERSION  POST CARDIOVERSION WITH APP FLEX  ON THE WEEK OF March 18 2016..   Any Other Special Instructions Will Be Listed Below (If Applicable).                                                                                                                                                       Jarrett Soho, [place]  03/07/2016 2:38 PM    [facility] [address], [place], [place]  [address] Phone: [phone]; Fax: [phone]

## 2016-03-07 NOTE — Progress Notes (Signed)
Subjective:  Patient ID: [name], female    DOB: 1966-01-08  Age: 50 y.o. MRN: [id number]  CC: Annual Exam and Hypertension   HPI [name] is a 50 year old female with a history of atrial fibrillation, cerebral infarction due to embolism of right middle cerebral artery status post IV TPA (in 10/2015) who is seeing me today for the first time. PCP is Dr.Jegede whom she has been unable to see.  She comes in today for complete physical exam and is found to be tachycardic with a heart rate of 140 on her vitals; she is not currently followed by cardiology.. She denies shortness of breath or chest pains but does have chronic headaches which she has had ever since her stroke in 10/2015. Scheduled to see neurology next week.  Outpatient Prescriptions Prior to Visit  Medication Sig Dispense Refill  . atorvastatin (LIPITOR) 20 MG tablet Take 1 tablet (20 mg total) by mouth daily at 6 PM. 30 tablet 2  . diltiazem (CARDIZEM LA) 120 MG 24 hr tablet Take 2 tablets (240 mg total) by mouth daily. 180 tablet 3  . metoprolol tartrate (LOPRESSOR) 25 MG tablet Take 0.5 tablets (12.5 mg total) by mouth 2 (two) times daily. 30 tablet 2  . rivaroxaban (XARELTO) 20 MG TABS tablet Take 1 tablet (20 mg total) by mouth daily with supper. 90 tablet 3   No facility-administered medications prior to visit.    ROS Review of Systems  Constitutional: Negative for activity change, appetite change and fatigue.  HENT: Negative for congestion, sinus pressure and sore throat.   Eyes: Negative for visual disturbance.  Respiratory: Negative for cough, chest tightness, shortness of breath and wheezing.   Cardiovascular: Negative for chest pain and palpitations.  Gastrointestinal: Negative for abdominal pain, constipation and abdominal distention.  Endocrine: Negative for polydipsia.  Genitourinary: Negative for dysuria and frequency.  Musculoskeletal: Negative for back pain and arthralgias.  Skin: Negative for  rash.  Neurological: Positive for headaches. Negative for tremors, light-headedness and numbness.  Hematological: Does not bruise/bleed easily.  Psychiatric/Behavioral: Negative for behavioral problems and agitation.    Objective:  BP 130/89 mmHg  Pulse 141  Temp(Src) 98.1 F (36.7 C) (Oral)  Resp 20  Ht 5' 3.5" (1.613 m)  Wt 191 lb 3.2 oz (86.728 kg)  BMI 33.33 kg/m2  SpO2 99%  BP/Weight 03/07/2016 [id number]  Systolic BP [id number]  Diastolic BP 89 83 89  Wt. (Lbs) 191.2 - 187  BMI 33.33 - 33.13      Physical Exam  Constitutional: She is oriented to person, place, and time. She appears well-developed and well-nourished. No distress.  HENT:  Head: Normocephalic.  Right Ear: External ear normal.  Left Ear: External ear normal.  Nose: Nose normal.  Mouth/Throat: Oropharynx is clear and moist.  Eyes: Conjunctivae and EOM are normal. Pupils are equal, round, and reactive to light.  Neck: Normal range of motion. No JVD present.  Cardiovascular: Normal heart sounds and intact distal pulses.  An irregular rhythm present. Tachycardia present.  Exam reveals no gallop.   No murmur heard. Pulmonary/Chest: Effort normal and breath sounds normal. No respiratory distress. She has no wheezes. She has no rales. She exhibits no tenderness. Right breast exhibits no mass, no skin change and no tenderness. Left breast exhibits no mass, no skin change and no tenderness.  Abdominal: Soft. Bowel sounds are normal. She exhibits no distension and no mass. There is no tenderness.  Musculoskeletal: Normal range of  motion. She exhibits no edema or tenderness.  Neurological: She is alert and oriented to person, place, and time. She has normal reflexes.  Skin: Skin is warm and dry. She is not diaphoretic.  Psychiatric: She has a normal mood and affect.     Assessment & Plan:   1. Persistent atrial fibrillation (HCC) Currently in A. Fib- called Heart Care and appointment obtained for this  afternoon at 2 PM. She has been compliant with metoprolol, Cardizem and Xarelto - Ambulatory referral to Cardiology  2. Screening for breast cancer - Mammogram Digital Screening; Future  3. Screening for colon cancer Referred to GI  4. Screening for cervical cancer - Cytology - PAP ([place])  5. Routine general medical examination at a health care facility - Hepatitis C antibody screen  6. Chronic tension-type headache, not intractable This is probably secondary to her stroke Will bee seeing neurology next week   No orders of the defined types were placed in this encounter.    Follow-up: Return in 3 days (on 03/10/2016) for Follow-up of atrial fibrillation.   Arnoldo Morale MD

## 2016-03-07 NOTE — Patient Instructions (Addendum)
Medication Instructions:   START TAKING METOPROLOL 25 MG TWICE A DAY   If you need a refill on your cardiac medications before your next appointment, please call your pharmacy.  Labwork: CBC BMET AND TSH  TODAY    Testing/Procedures:  SEE LETTER FOR CARDIOVERSION 03/12/2016.Marland KitchenWITH DR [facility]    Follow-Up: ON 03/11/2016  ... NURSE VISIT OR PHARM D FOR AN EKG FOLLOW UP  THE DAY BEFORE CARDIOVERSION  POST CARDIOVERSION WITH APP FLEX  ON THE WEEK OF March 18 2016..   Any Other Special Instructions Will Be Listed Below (If Applicable).

## 2016-03-08 LAB — HEPATITIS C ANTIBODY: HCV Ab: NEGATIVE

## 2016-03-10 ENCOUNTER — Other Ambulatory Visit: Payer: Self-pay

## 2016-03-10 ENCOUNTER — Ambulatory Visit: Payer: BC Managed Care – PPO | Admitting: Family Medicine

## 2016-03-10 ENCOUNTER — Other Ambulatory Visit: Payer: Self-pay | Admitting: Family Medicine

## 2016-03-10 ENCOUNTER — Telehealth: Payer: Self-pay

## 2016-03-10 DIAGNOSIS — A5901 Trichomonal vulvovaginitis: Secondary | ICD-10-CM

## 2016-03-10 LAB — CERVICOVAGINAL ANCILLARY ONLY
Chlamydia: NEGATIVE
Neisseria Gonorrhea: NEGATIVE
Wet Prep (BD Affirm): POSITIVE — AB

## 2016-03-10 LAB — CYTOLOGY - PAP

## 2016-03-10 MED ORDER — METRONIDAZOLE 500 MG PO TABS: 2000.0000 mg | ORAL_TABLET | Freq: Once | ORAL | Status: DC

## 2016-03-10 MED ORDER — DILTIAZEM HCL ER COATED BEADS 240 MG PO TB24: 240.0000 mg | ORAL_TABLET | Freq: Every day | ORAL | Status: DC

## 2016-03-10 MED FILL — metroNIDAZOLE 500 MG TABS: 500 | 1 days supply | Qty: 4 | Fill #0

## 2016-03-10 NOTE — Telephone Encounter (Signed)
Phone call from [facility]. They state Diltiazem LA 120mg  2 po daily will not be covered by [employer]. Changed to Diltiazem LA 240 mg po daily. New Rx sent.

## 2016-03-11 ENCOUNTER — Ambulatory Visit (INDEPENDENT_AMBULATORY_CARE_PROVIDER_SITE_OTHER): Payer: BC Managed Care – PPO | Admitting: *Deleted

## 2016-03-11 ENCOUNTER — Telehealth: Payer: Self-pay | Admitting: Family Medicine

## 2016-03-11 ENCOUNTER — Telehealth: Payer: Self-pay

## 2016-03-11 DIAGNOSIS — I4892 Unspecified atrial flutter: Secondary | ICD-10-CM | POA: Diagnosis not present

## 2016-03-11 NOTE — Telephone Encounter (Signed)
-----   Message from Arnoldo Morale, MD sent at 03/10/2016  5:08 PM EDT ----- Cultures were negative for chlamydia and gonorrhea however she is positive for Trichomonas which is an STD and I have sent a prescription for metronidazole, say. Her partner also needs to get treated. May results are pending and we will get in touch with her once we have them.

## 2016-03-11 NOTE — Patient Instructions (Signed)
Proceed with planned DCCV tomorrow

## 2016-03-11 NOTE — Telephone Encounter (Signed)
Pt. Returned call. Please f/u with pt. °

## 2016-03-11 NOTE — Telephone Encounter (Signed)
Writer called patient, both her phones are not working.  Writer spoke with her daughter who will have patient call here when she returns home.

## 2016-03-11 NOTE — Progress Notes (Signed)
Patient here today for pre DCCV EKG.  EKG done and Dr Acie Fredrickson reviewed.  Will proceed as scheduled with DCCV tomorrow

## 2016-03-12 ENCOUNTER — Ambulatory Visit (HOSPITAL_COMMUNITY): Payer: BC Managed Care – PPO | Admitting: Anesthesiology

## 2016-03-12 ENCOUNTER — Ambulatory Visit (HOSPITAL_COMMUNITY)
Admission: RE | Admit: 2016-03-12 | Discharge: 2016-03-12 | Disposition: A | Discharge status: Home / Self Care | Payer: BC Managed Care – PPO | Source: Ambulatory Visit | Attending: Cardiology | Admitting: Cardiology

## 2016-03-12 ENCOUNTER — Encounter (HOSPITAL_COMMUNITY): Payer: Self-pay | Admitting: *Deleted

## 2016-03-12 ENCOUNTER — Encounter (HOSPITAL_COMMUNITY)
Admission: RE | Disposition: A | Discharge status: Home / Self Care | Payer: Self-pay | Source: Ambulatory Visit | Attending: Cardiology

## 2016-03-12 DIAGNOSIS — I119 Hypertensive heart disease without heart failure: Secondary | ICD-10-CM | POA: Insufficient documentation

## 2016-03-12 DIAGNOSIS — I4892 Unspecified atrial flutter: Secondary | ICD-10-CM | POA: Insufficient documentation

## 2016-03-12 DIAGNOSIS — Z79899 Other long term (current) drug therapy: Secondary | ICD-10-CM | POA: Diagnosis not present

## 2016-03-12 DIAGNOSIS — Z8673 Personal history of transient ischemic attack (TIA), and cerebral infarction without residual deficits: Secondary | ICD-10-CM | POA: Insufficient documentation

## 2016-03-12 DIAGNOSIS — Z9114 Patient's other noncompliance with medication regimen: Secondary | ICD-10-CM | POA: Diagnosis not present

## 2016-03-12 DIAGNOSIS — Z7901 Long term (current) use of anticoagulants: Secondary | ICD-10-CM | POA: Diagnosis not present

## 2016-03-12 DIAGNOSIS — I480 Paroxysmal atrial fibrillation: Secondary | ICD-10-CM | POA: Insufficient documentation

## 2016-03-12 DIAGNOSIS — I481 Persistent atrial fibrillation: Secondary | ICD-10-CM | POA: Diagnosis not present

## 2016-03-12 DIAGNOSIS — Z87891 Personal history of nicotine dependence: Secondary | ICD-10-CM | POA: Insufficient documentation

## 2016-03-12 DIAGNOSIS — J449 Chronic obstructive pulmonary disease, unspecified: Secondary | ICD-10-CM | POA: Insufficient documentation

## 2016-03-12 HISTORY — PX: CARDIOVERSION: SHX1299

## 2016-03-12 SURGERY — CARDIOVERSION
Anesthesia: General

## 2016-03-12 MED ORDER — SODIUM CHLORIDE 0.9 % IV SOLN: 250.0000 mL | INTRAVENOUS | Status: DC

## 2016-03-12 MED ORDER — SODIUM CHLORIDE 0.9% FLUSH: 3.0000 mL | INTRAVENOUS | Status: DC | PRN

## 2016-03-12 MED ORDER — PROPOFOL 10 MG/ML IV BOLUS
INTRAVENOUS | Status: DC | PRN
  Administered 2016-03-12: 60 mg via INTRAVENOUS

## 2016-03-12 MED ORDER — SODIUM CHLORIDE 0.9% FLUSH: 3.0000 mL | Freq: Two times a day (BID) | INTRAVENOUS | Status: DC

## 2016-03-12 MED ORDER — SODIUM CHLORIDE 0.9 % IV SOLN
250.0000 mL | INTRAVENOUS | Status: DC
  Administered 2016-03-12: 250 mL via INTRAVENOUS

## 2016-03-12 MED ORDER — LIDOCAINE HCL (CARDIAC) 20 MG/ML IV SOLN
INTRAVENOUS | Status: DC | PRN
  Administered 2016-03-12: 40 mg via INTRATRACHEAL

## 2016-03-12 NOTE — Transfer of Care (Signed)
Immediate Anesthesia Transfer of Care Note  Patient: [name]  Procedure(s) Performed: Procedure(s): CARDIOVERSION (N/A)  Patient Location: Endoscopy Unit  Anesthesia Type:MAC  Level of Consciousness: awake, alert  and oriented  Airway & Oxygen Therapy: Patient Spontanous Breathing and Patient connected to nasal cannula oxygen  Post-op Assessment: Report given to RN, Post -op Vital signs reviewed and stable and Patient moving all extremities X 4  Post vital signs: Reviewed and stable  Last Vitals:  Filed Vitals:   03/12/16 1113  BP: 134/103  Pulse: 105  Temp: 36.6 C  Resp: 21    Last Pain: There were no vitals filed for this visit.       Complications: No apparent anesthesia complications

## 2016-03-12 NOTE — Interval H&P Note (Signed)
History and Physical Interval Note:  03/12/2016 1:36 PM  [name]  has presented today for surgery, with the diagnosis of AFIB  The various methods of treatment have been discussed with the patient and family. After consideration of risks, benefits and other options for treatment, the patient has consented to  Procedure(s): CARDIOVERSION (N/A) as a surgical intervention .  The patient's history has been reviewed, patient examined, no change in status, stable for surgery.  I have reviewed the patient's chart and labs.  Questions were answered to the patient's satisfaction.     [name]

## 2016-03-12 NOTE — Discharge Instructions (Signed)
Electrical Cardioversion, Care After °Refer to this sheet in the next few weeks. These instructions provide you with information on caring for yourself after your procedure. Your health care provider may also give you more specific instructions. Your treatment has been planned according to current medical practices, but problems sometimes occur. Call your health care provider if you have any problems or questions after your procedure. °WHAT TO EXPECT AFTER THE PROCEDURE °After your procedure, it is typical to have the following sensations: °· Some redness on the skin where the shocks were delivered. If this is tender, a sunburn lotion or hydrocortisone cream may help. °· Possible return of an abnormal heart rhythm within hours or days after the procedure. °HOME CARE INSTRUCTIONS °· Take medicines only as directed by your health care provider. Be sure you understand how and when to take your medicine. °· Learn how to feel your pulse and check it often. °· Limit your activity for 48 hours after the procedure or as directed by your health care provider. °· Avoid or minimize caffeine and other stimulants as directed by your health care provider. °SEEK MEDICAL CARE IF: °· You feel like your heart is beating too fast or your pulse is not regular. °· You have any questions about your medicines. °· You have bleeding that will not stop. °SEEK IMMEDIATE MEDICAL CARE IF: °· You are dizzy or feel faint. °· It is hard to breathe or you feel short of breath. °· There is a change in discomfort in your chest. °· Your speech is slurred or you have trouble moving an arm or leg on one side of your body. °· You get a serious muscle cramp that does not go away. °· Your fingers or toes turn cold or blue. °  °This information is not intended to replace advice given to you by your health care provider. Make sure you discuss any questions you have with your health care provider. °  °Document Released: 06/22/2013 Document Revised: 09/22/2014  Document Reviewed: 06/22/2013 °Elsevier Interactive Patient Education ©2016 [employer] ° °

## 2016-03-12 NOTE — H&P (View-Only) (Signed)
Cardiology Office Note    Date:  03/07/2016   ID:  [name], DOB [birth year], MRN [id number]  PCP:  Arnoldo Morale, MD  Cardiologist:  Dr. Henderson Cloud. [place]  Chief Complaint: Afib  History of Present Illness:   [name] is a 50 y.o. female with a past medical history significant for HTN, recurrent persistent atrial fibrillation and atrial flutter, hx of non compliance, COPD,  TIA and recent admission [id number] for embolic stroke who added to schedule for afib.   Has a previous TIA and long history of noncompliance with meds and follow up. Xarelto prescribed at DC from hospital Jan 2016 and refilled by PCP July 2016.[name] She stopped it around Aug 2016  after seeing scary ads about Xarelto complications on TV.  She was admitted to hospital [id number] for acute embolic right hemispheric stroke with severe deficits s/p IV tPA while off anticoagulation. She did not require an embolectomy. Post tPA administration MRI showed small amount of asymptomatic hemorrhagic transformation and xaralto held in hospital. Plan want to aspirin 1 week, then resume Xarelto. Amiodarone given for afib with RVR.  She has NSVT during admission and BB was added.   The patient was in sinus rhythm per review of EKG 01/31/16 in ED for intermittent tingling and swelling to bilateral arms, feet, and face.  She was at her PCP office ([place]) today for complete physical where she found to be tachycardic with heart rate of 140 on her vitals. Irregular rhythm on exam thus added to [facility] schedule for further evaluation. EKG showed afib. She is completely asymptomatic. The patient denies nausea, vomiting, fever, chest pain, palpitations, shortness of breath, orthopnea, PND, dizziness, syncope, cough, congestion, abdominal pain, hematochezia, melena, lower extremity edema. Compliant with medications.    Past Medical History  Diagnosis Date  . Paroxysmal a-fib ([place])   . Shortness of  breath   . Hypertension   . Transient ischemic attack (TIA) 01/2013  . Encounter for long-term (current) use of other medications   . Heart murmur   . Pneumonia 02/2011; 08/2011; 03/22/2014  . Stroke [facility])     of mid right middle cerebral atery - recieved TPA    Past Surgical History  Procedure Laterality Date  . Tee without cardioversion N/A 02/18/2013    Procedure: TRANSESOPHAGEAL ECHOCARDIOGRAM (TEE);  Surgeon: Sanda Klein, MD;  Location: [place];  Service: Cardiovascular;  Laterality: N/A;  . Cardioversion N/A 02/18/2013    Procedure: CARDIOVERSION;  Surgeon: Sanda Klein, MD;  Location: [facility];  Service: Cardiovascular;  Laterality: N/A;  . Cardioversion N/A 02/23/2013    Procedure: CARDIOVERSION;  Surgeon: Leonie Man, MD;  Location: [place];  Service: Cardiovascular;  Laterality: N/A;  BESIDE CV  . Tubal ligation  1992    Current Medications: Prior to Admission medications   Medication Sig Start Date End Date Taking? Authorizing Provider  atorvastatin (LIPITOR) 20 MG tablet Take 1 tablet (20 mg total) by mouth daily at 6 PM. 02/21/16   Tresa Garter, MD  diltiazem (CARDIZEM LA) 120 MG 24 hr tablet Take 2 tablets (240 mg total) by mouth daily. 12/26/15   Tresa Garter, MD  metoprolol tartrate (LOPRESSOR) 25 MG tablet Take 0.5 tablets (12.5 mg total) by mouth 2 (two) times daily. 11/06/15   Donzetta Starch, NP  rivaroxaban (XARELTO) 20 MG TABS tablet Take 1 tablet (20 mg total) by mouth daily with supper. 11/13/15   Donzetta Starch, NP  Allergies:   Penicillins   Social History   Social History  . Marital Status: Single    Spouse Name: N/A  . Number of Children: N/A  . Years of Education: N/A   Occupational History  . assembly line    Social History Main Topics  . Smoking status: Former Smoker -- 3 years    Types: Cigars    Quit date: 03/15/2013  . Smokeless tobacco: Never Used  . Alcohol Use: 1.2 - 1.8 oz/week    2-3 Glasses of wine per week      Comment: only on weekends  . Drug Use: No  . Sexual Activity: Not Currently    Birth Control/ Protection: None   Other Topics Concern  . None   Social History Narrative     Family History:  The patient's family history includes Atrial fibrillation in her father and mother; Cancer in her mother; Hypertension in her father; Peripheral vascular disease in her father. There is no history of Heart attack or Stroke.   ROS:   Please see the history of present illness.    ROS All other systems reviewed and are negative.   PHYSICAL EXAM:   VS:  BP 110/80 mmHg  Pulse 123  Ht 5' 3.5" (1.613 m)  Wt 194 lb (87.998 kg)  BMI 33.82 kg/m2   GEN: Well nourished, well developed, in no acute distress HEENT: normal Neck: no JVD, carotid bruits, or masses Cardiac: Ir Ir tachycardiac; no murmurs, rubs, or gallops,no edema  Respiratory:  clear to auscultation bilaterally, normal work of breathing GI: soft, nontender, nondistended, + BS MS: no deformity or atrophy Skin: warm and dry, no rash Neuro:  Alert and Oriented x 3, Strength and sensation are intact Psych: euthymic mood, full affect  Wt Readings from Last 3 Encounters:  03/07/16 194 lb (87.998 kg)  03/07/16 191 lb 3.2 oz (86.728 kg)  11/14/15 187 lb (84.823 kg)      Studies/Labs Reviewed:   EKG:  EKG is ordered today.  The ekg ordered today demonstrates afib at rate of 123 bpm.   Recent Labs: 03/22/2015: TSH 1.938 11/03/2015: Magnesium 1.8 01/31/2016: ALT 27; BUN 10; Creatinine, Ser 0.94; Hemoglobin 11.9*; Platelets 169; Potassium 3.7; Sodium 141   Lipid Panel    Component Value Date/Time   CHOL 131 11/03/2015 0405   TRIG 48 11/03/2015 0405   HDL 32* 11/03/2015 0405   CHOLHDL 4.1 11/03/2015 0405   VLDL 10 11/03/2015 0405   LDLCALC 89 11/03/2015 0405    Additional studies/ records that were reviewed today include:   Echocardiogram: 11/03/15  LV EF: 60% -  65%  ------------------------------------------------------------------- Indications: CVA 436.  ------------------------------------------------------------------- History: PMH: Chest pain. Atrial flutter. Risk factors: Former tobacco use. Hypertension.  ------------------------------------------------------------------- Study Conclusions  - Left ventricle: The cavity size was normal. Wall thickness was  increased in a pattern of moderate LVH. Systolic function was  normal. The estimated ejection fraction was in the range of 60%  to 65%. Wall motion was normal; there were no regional wall  motion abnormalities. - Aortic valve: There was mild regurgitation. Valve area (VTI):  2.33 cm^2. Valve area (Vmax): 2.45 cm^2. - Mitral valve: There was mild regurgitation. - Left atrium: The atrium was severely dilated. - Right atrium: The atrium was severely dilated. - Atrial septum: No defect or patent foramen ovale was identified. - Pulmonary arteries: Systolic pressure was mildly increased. PA  peak pressure: 35 mm Hg (S). - Technically adequate study.    ASSESSMENT &  PLAN:    1. Recurrent PAF - CHADSVasc score 4.Presents today with afib RVR. Compliant with Xarelto. She is asymptomatic. Unable to tell when she goes into afib. She was in sinus rhythm 01/31/16 when seen in ER. Unknown etiology. Will increase BB. Plan for DCCV next week with EKG in clinic day prior. F/u 1 week post DCCV. If shes goes in and out of afib in future, consider EP evaluation for antiarrhythmic vs ablation.   2. Hypertensive heart disease - BP stable and well controlled today.Continue CCB and BB.   3. Hx of Embolic stroke which off anticoagulation - She has appointment with neurology in few weeks.   4. HL - 11/03/2015: Cholesterol 131; HDL 32*; LDL Cholesterol 89; Triglycerides 48; VLDL 10  - Continue statin   Medication Adjustments/Labs and Tests Ordered: Current medicines are  reviewed at length with the patient today.  Concerns regarding medicines are outlined above.  Medication changes, Labs and Tests ordered today are listed in the Patient Instructions below. Patient Instructions  Medication Instructions:   START TAKING METOPROLOL 25 MG TWICE A DAY   If you need a refill on your cardiac medications before your next appointment, please call your pharmacy.  Labwork: CBC BMET AND TSH  TODAY    Testing/Procedures:  SEE LETTER FOR CARDIOVERSION 03/12/2016.Elizabeth KitchenWITH DR [facility]    Follow-Up: ON 03/11/2016  ... NURSE VISIT OR PHARM D FOR AN EKG FOLLOW UP  THE DAY BEFORE CARDIOVERSION  POST CARDIOVERSION WITH APP FLEX  ON THE WEEK OF March 18 2016..   Any Other Special Instructions Will Be Listed Below (If Applicable).                                                                                                                                                       Jarrett Soho, [place]  03/07/2016 2:38 PM    [facility] [place], [place], [place]  [address] Phone: [phone]; Fax: [phone]

## 2016-03-12 NOTE — Anesthesia Postprocedure Evaluation (Signed)
Anesthesia Post Note  Patient: [name]  Procedure(s) Performed: Procedure(s) (LRB): CARDIOVERSION (N/A)  Patient location during evaluation: PACU Anesthesia Type: MAC Level of consciousness: awake and alert Pain management: pain level controlled Vital Signs Assessment: post-procedure vital signs reviewed and stable Respiratory status: spontaneous breathing, nonlabored ventilation and respiratory function stable Cardiovascular status: stable and blood pressure returned to baseline Anesthetic complications: no    Last Vitals:  Filed Vitals:   03/12/16 1405 03/12/16 1415  BP: 97/63 118/74  Pulse: 54 59  Temp:    Resp: 18 23    Last Pain: There were no vitals filed for this visit.               Tierre Netto,W. EDMOND

## 2016-03-12 NOTE — Anesthesia Preprocedure Evaluation (Addendum)
Anesthesia Evaluation  Patient identified by MRN, date of birth, ID band Patient awake    Reviewed: Allergy & Precautions, H&P , NPO status , Patient's Chart, lab work & pertinent test results  Airway Mallampati: III  TM Distance: >3 FB Neck ROM: Full    Dental no notable dental hx. (+) Teeth Intact, Dental Advisory Given   Pulmonary neg pulmonary ROS, former smoker,    Pulmonary exam normal breath sounds clear to auscultation       Cardiovascular hypertension, Pt. on medications and Pt. on home beta blockers  Rhythm:Irregular Rate:Normal - Systolic murmurs    Neuro/Psych CVA negative psych ROS   GI/Hepatic negative GI ROS, Neg liver ROS,   Endo/Other  negative endocrine ROS  Renal/GU negative Renal ROS  negative genitourinary   Musculoskeletal   Abdominal   Peds  Hematology negative hematology ROS (+)   Anesthesia Other Findings   Reproductive/Obstetrics negative OB ROS                            Anesthesia Physical Anesthesia Plan  ASA: III  Anesthesia Plan: General   Post-op Pain Management:    Induction: Intravenous  Airway Management Planned: Mask  Additional Equipment:   Intra-op Plan:   Post-operative Plan:   Informed Consent: I have reviewed the patients History and Physical, chart, labs and discussed the procedure including the risks, benefits and alternatives for the proposed anesthesia with the patient or authorized representative who has indicated his/her understanding and acceptance.   Dental advisory given  Plan Discussed with: CRNA  Anesthesia Plan Comments:         Anesthesia Quick Evaluation

## 2016-03-12 NOTE — Procedures (Signed)
Electrical Cardioversion Procedure Note [name] [id number] 01-Dec-1965  Procedure: Electrical Cardioversion Indications:  Atrial flutter.  Procedure Details Consent: Risks of procedure as well as the alternatives and risks of each were explained to the (patient/caregiver).  Consent for procedure obtained. Time Out: Verified patient identification, verified procedure, site/side was marked, verified correct patient position, special equipment/implants available, medications/allergies/relevent history reviewed, required imaging and test results available.  Performed  Patient placed on cardiac monitor, pulse oximetry, supplemental oxygen as necessary.  Sedation given: Propofol per anesthesia Pacer pads placed anterior and posterior chest.  Cardioverted 1 time(s).  Cardioverted at 150J.  Evaluation Findings: Post procedure EKG shows: NSR Complications: None Patient did tolerate procedure well.   Loralie Champagne 03/12/2016, 1:39 PM

## 2016-03-12 NOTE — Anesthesia Procedure Notes (Signed)
Procedure Name: MAC Date/Time: 03/12/2016 1:32 PM Performed by: Mariea Clonts Pre-anesthesia Checklist: Patient identified, Emergency Drugs available, Suction available, Patient being monitored and Timeout performed Patient Re-evaluated:Patient Re-evaluated prior to inductionOxygen Delivery Method: Nasal cannula and Ambu bag Preoxygenation: Pre-oxygenation with 100% oxygen

## 2016-03-13 ENCOUNTER — Encounter (HOSPITAL_COMMUNITY): Payer: Self-pay | Admitting: Cardiology

## 2016-03-13 ENCOUNTER — Telehealth: Payer: Self-pay

## 2016-03-13 NOTE — Telephone Encounter (Signed)
Writer spoke with patient regarding "cultures were negative for chlamydia and gonorrhea however she is positive for Trichomonas which is an STD and I have sent a prescription for metronidazole, say. Her partner also needs to get treated."  Patient stated understanding and has taken the flagyl, but states she did not know that it was to treat an STD.

## 2016-03-13 NOTE — Telephone Encounter (Signed)
-----   Message from Arnoldo Morale, MD sent at 03/10/2016  5:08 PM EDT ----- Cultures were negative for chlamydia and gonorrhea however she is positive for Trichomonas which is an STD and I have sent a prescription for metronidazole, say. Her partner also needs to get treated. May results are pending and we will get in touch with her once we have them.

## 2016-03-13 NOTE — Telephone Encounter (Signed)
Attempted to call patient who pone is "not working", was provided with an alternative number on the voice mail and when number called it just rang with no VM.

## 2016-03-14 ENCOUNTER — Telehealth: Payer: Self-pay

## 2016-03-14 NOTE — Telephone Encounter (Signed)
-----   Message from Arnoldo Morale, MD sent at 03/12/2016  1:52 PM EDT ----- Pap smear is normal; please ensure she has picked a prescription for treatment of Trichomonas.

## 2016-03-14 NOTE — Telephone Encounter (Signed)
Writer called patient with pap smear results.  Patient has picked up the flagyl and has taken it.

## 2016-03-19 ENCOUNTER — Other Ambulatory Visit: Payer: Self-pay | Admitting: Family Medicine

## 2016-03-19 DIAGNOSIS — Z1231 Encounter for screening mammogram for malignant neoplasm of breast: Secondary | ICD-10-CM

## 2016-03-20 ENCOUNTER — Encounter: Payer: BC Managed Care – PPO | Admitting: Physician Assistant

## 2016-03-20 DIAGNOSIS — R0989 Other specified symptoms and signs involving the circulatory and respiratory systems: Secondary | ICD-10-CM

## 2016-03-20 NOTE — Progress Notes (Signed)
ROS   Patient is no show This encounter was created in error - please disregard.

## 2016-03-21 MED FILL — ATORVASTATIN 20 MG TABLET: 20 | 30 days supply | Qty: 30 | Fill #1

## 2016-03-26 ENCOUNTER — Encounter: Payer: Self-pay | Admitting: Physician Assistant

## 2016-04-02 ENCOUNTER — Encounter: Payer: Self-pay | Admitting: Physician Assistant

## 2016-04-03 ENCOUNTER — Ambulatory Visit (INDEPENDENT_AMBULATORY_CARE_PROVIDER_SITE_OTHER): Payer: BC Managed Care – PPO | Admitting: Neurology

## 2016-04-03 ENCOUNTER — Encounter: Payer: Self-pay | Admitting: Neurology

## 2016-04-03 VITALS — BP 140/91 | HR 74 | Ht 63.5 in | Wt 196.6 lb

## 2016-04-03 DIAGNOSIS — I63131 Cerebral infarction due to embolism of right carotid artery: Secondary | ICD-10-CM

## 2016-04-03 NOTE — Progress Notes (Signed)
[facility] [address] [place]. [place] [address] [phone]       OFFICE FOLLOW-UP NOTE  [name] Date of Birth:  April 30, 1966 Medical Record Number:  [id number]   HPI: Ms [name] is a 50 year old African-American lady seen today for first office follow-up visit following hospital admission for stroke in February 2017. She states she missed her earlier appointment as she could not afford her co-pay. 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  And she was sitting and having a meal with coworkers when she dropped the phone in her hand and slumped to the left   Time of onset was 10:30 AM. Patient has a known history of atrial fibrillation and was followed by cardiology. Known to have poor compliance with her medications as outpatient. She was on Xarelto previously for A. Fib but she self discontinued 6 months ago. She reported that the reason for discontinuation as after she saw the TV advertisements about xarelto causing bleeding problems. She denied any recent bleeding issues. No recent surgeries, head trauma, no history of prior strokes a recent MRI,  Her initial neurologic examination when she presented to the ER Showed dense left hemi-neglect, With right left confusion, Moderate left hemiparesis that she had drift in left upper and lower extremities, And left facial weakness dysarthria.  she was also in A. Fib with rapid  heart rate up to 140s and 150s while she was in the ER. Blood pressure recording in the monitor was inaccurate which showed systolics in Q000111Q to Q000111Q but diastolics about A999333. Manual blood pressure using a Doppler was obtained which confirmed hypertension with systolic blood pressures and 80s. She was immediately started on IV fluid boluses with normal saline 2 L which improved her blood pressure to A999333 to 0000000 systolic range. After  confirming no contraindications , and that the last this is an option was greater than 6 months ago,  IV TPA was started in the ER at 11:40 AM , roughly about 1 hour 10 minutes after symptom onset. Within about 15 minutes after starting IV TPA, Patient was noted to have significant improvement of her left hemi-neglect. The left hemiparesis is also improved with no drift, And only had mild weakness with resistance testing, Mild residual left facial weakness with flattening of the nasolabial fold, Improved dysarthria, No sensory loss.  A stat CT angiogram of the head and neck with perfusion study were obtained. It showed a distal right M2 branch thrombus with perfusion deficits noted in the right parietal lobe. Patient`s imaging findings and clinical symptoms with Dr. Elroy Channel for any role for interventional therapy at this point. Given that the patient's symptoms have significant improvement and they're mild at this time after the IV TPA,, and unstable cardiac situation with RVR and labile blood pressure, requiring amiodarone for RVR, I felt it was best not to intubate the patient for any interventional procedures at this time. Dr. Elroy Channel agreed with it.   She is admitted to the ICU for post-TPA monitoring and care.  She states she's done well since discharge and made a full neurological recovery and has no residual weakness. She was supposed to see me 2 months after discharge but could not afford the co-pay and hence did not schedule the appointment. She complains of mild headaches that she's had ever since her stroke. These occur 2 or 3 times per week and a  bifrontal. Mild 4/10 in severity. They're dull in nature. There is no complain nausea, vomiting, light or sound sensitivity. She does not need any specific medications for these. She has been now compliant with Xarelto which is tolerating well without bleeding or bruising. She also remains on Zocor is tolerating it well without muscle  aches or pains. Blood pressure is well controlled today it is borderline at 140/91.  ROS:   14 system review of systems is positive for headache only and all other systems negative  PMH:  Past Medical History  Diagnosis Date  . Paroxysmal a-fib ([place])   . Shortness of breath   . Hypertension   . Transient ischemic attack (TIA) 01/2013  . Encounter for long-term (current) use of other medications   . Heart murmur   . Pneumonia 02/2011; 08/2011; 03/22/2014  . Stroke [facility])     of mid right middle cerebral atery - recieved TPA    Social History:  Social History   Social History  . Marital Status: Single    Spouse Name: N/A  . Number of Children: N/A  . Years of Education: N/A   Occupational History  . assembly line    Social History Main Topics  . Smoking status: Former Smoker -- 3 years    Types: Cigars    Quit date: 03/15/2013  . Smokeless tobacco: Never Used  . Alcohol Use: 1.2 - 1.8 oz/week    2-3 Glasses of wine per week     Comment: only on weekends  . Drug Use: No  . Sexual Activity: Not Currently    Birth Control/ Protection: None   Other Topics Concern  . Not on file   Social History Narrative    Medications:   Current Outpatient Prescriptions on File Prior to Visit  Medication Sig Dispense Refill  . atorvastatin (LIPITOR) 20 MG tablet Take 1 tablet (20 mg total) by mouth daily at 6 PM. 30 tablet 2  . metoprolol tartrate (LOPRESSOR) 25 MG tablet Take 1 tablet (25 mg total) by mouth 2 (two) times daily. 180 tablet 3  . rivaroxaban (XARELTO) 20 MG TABS tablet Take 1 tablet (20 mg total) by mouth daily with supper. 90 tablet 3   No current facility-administered medications on file prior to visit.    Allergies:   Allergies  Allergen Reactions  . Penicillins Hives and Other (See Comments)    Unknown  Has patient had a PCN reaction causing immediate rash, facial/tongue/throat swelling, SOB or lightheadedness with hypotension: No Has patient had a PCN  reaction causing severe rash involving mucus membranes or skin necrosis: No Has patient had a PCN reaction that required hospitalization No Has patient had a PCN reaction occurring within the last 10 years: No If all of the above answers are "NO", then may proceed with Cephalosporin use.    Physical Exam General: Mildly obese middle-aged African-American lady seated, in no evident distress Head: head normocephalic and atraumatic.  Neck: supple with no carotid or supraclavicular bruits Cardiovascular: regular rate and rhythm, no murmurs Musculoskeletal: no deformity Skin:  no rash/petichiae Vascular:  Normal pulses all extremities Filed Vitals:   04/03/16 0933  BP: 140/91  Pulse: 74   Neurologic Exam Mental Status: Awake and fully alert. Oriented to place and time. Recent and remote memory intact. Attention span, concentration and fund of knowledge appropriate. Mood and affect appropriate.  Cranial Nerves: Fundoscopic exam reveals sharp disc margins. Pupils equal, briskly reactive to light. Extraocular movements full without nystagmus. Visual fields  full to confrontation. Hearing intact. Facial sensation intact. Face, tongue, palate moves normally and symmetrically.  Motor: Normal bulk and tone. Normal strength in all tested extremity muscles.Diminished fine finger movements on the left. Orbits left over right upper extremity. Sensory.: intact to touch ,pinprick .position and vibratory sensation.  Coordination: Rapid alternating movements normal in all extremities. Finger-to-nose and heel-to-shin performed accurately bilaterally. Gait and Station: Arises from chair without difficulty. Stance is normal. Gait demonstrates normal stride length and balance . Able to heel, toe and tandem walk without difficulty.  Reflexes: 1+ and symmetric. Toes downgoing.   NIHSS  0 Modified Rankin  1  ASSESSMENT: 34 year African-American lady with embolic right MCA branch infarct in February 2017 second  to cardiac embolism from atrial flutter/fibrillation. Vascular risk factors of A. fib, hypertension, hyperlipidemia and mild obesity. New mild headaches likely muscle tension headaches    PLAN: I had a long d/w patient about her recent stroke,atrial flutter-fibrillation, risk for recurrent stroke/TIAs, personally independently reviewed imaging studies and stroke evaluation results and answered questions.Continue Xarelto (rivaroxaban) daily  for secondary stroke prevention and maintain strict control of hypertension with blood pressure goal below 130/90, diabetes with hemoglobin A1c goal below 6.5% and lipids with LDL cholesterol goal below 70 mg/dL. I also advised the patient to eat a healthy diet with plenty of whole grains, cereals, fruits and vegetables, exercise regularly and maintain ideal body weight .I encouraged her to increase participation in daily activities for stress laxation like regular exercise, meditation and yoga developed with the tension headachesGreater than 50% of time during this 25 minute visit was spent on counseling,explanation of diagnosis, planning of further management, discussion with patient and family and coordination of care . Followup in the future with  Stroke NP in 6 months or call earlier if necessary. Antony Contras, MD  [facility] [address] [place], [place] [address]  Phone [phone] Fax [phone] Note: This document was prepared with digital dictation and possible smart phrase technology. Any transcriptional errors that result from this process are unintentional

## 2016-04-03 NOTE — Patient Instructions (Addendum)
I had a long d/w patient about her recent stroke,atrial flutter-fibrillation, risk for recurrent stroke/TIAs, personally independently reviewed imaging studies and stroke evaluation results and answered questions.Continue Xarelto (rivaroxaban) daily  for secondary stroke prevention and maintain strict control of hypertension with blood pressure goal below 130/90, diabetes with hemoglobin A1c goal below 6.5% and lipids with LDL cholesterol goal below 70 mg/dL. I also advised the patient to eat a healthy diet with plenty of whole grains, cereals, fruits and vegetables, exercise regularly and maintain ideal body weight .I encouraged her to increase participation in daily activities for stress laxation like regular exercise, meditation and yoga developed with the tension headaches Followup in the future with  Stroke NP in 6 months or call earlier if necessary.  Stroke Prevention Some medical conditions and behaviors are associated with an increased chance of having a stroke. You may prevent a stroke by making healthy choices and managing medical conditions. HOW CAN I REDUCE MY RISK OF HAVING A STROKE?   Stay physically active. Get at least 30 minutes of activity on most or all days.  Do not smoke. It may also be helpful to avoid exposure to secondhand smoke.  Limit alcohol use. Moderate alcohol use is considered to be:  No more than 2 drinks per day for men.  No more than 1 drink per day for nonpregnant women.  Eat healthy foods. This involves:  Eating 5 or more servings of fruits and vegetables a day.  Making dietary changes that address high blood pressure (hypertension), high cholesterol, diabetes, or obesity.  Manage your cholesterol levels.  Making food choices that are high in fiber and low in saturated fat, trans fat, and cholesterol may control cholesterol levels.  Take any prescribed medicines to control cholesterol as directed by your health care provider.  Manage your  diabetes.  Controlling your carbohydrate and sugar intake is recommended to manage diabetes.  Take any prescribed medicines to control diabetes as directed by your health care provider.  Control your hypertension.  Making food choices that are low in salt (sodium), saturated fat, trans fat, and cholesterol is recommended to manage hypertension.  Ask your health care provider if you need treatment to lower your blood pressure. Take any prescribed medicines to control hypertension as directed by your health care provider.  If you are 35-26 years of age, have your blood pressure checked every 3-5 years. If you are 77 years of age or older, have your blood pressure checked every year.  Maintain a healthy weight.  Reducing calorie intake and making food choices that are low in sodium, saturated fat, trans fat, and cholesterol are recommended to manage weight.  Stop drug abuse.  Avoid taking birth control pills.  Talk to your health care provider about the risks of taking birth control pills if you are over 61 years old, smoke, get migraines, or have ever had a blood clot.  Get evaluated for sleep disorders (sleep apnea).  Talk to your health care provider about getting a sleep evaluation if you snore a lot or have excessive sleepiness.  Take medicines only as directed by your health care provider.  For some people, aspirin or blood thinners (anticoagulants) are helpful in reducing the risk of forming abnormal blood clots that can lead to stroke. If you have the irregular heart rhythm of atrial fibrillation, you should be on a blood thinner unless there is a good reason you cannot take them.  Understand all your medicine instructions.  Make sure that other  conditions (such as anemia or atherosclerosis) are addressed. SEEK IMMEDIATE MEDICAL CARE IF:   You have sudden weakness or numbness of the face, arm, or leg, especially on one side of the body.  Your face or eyelid droops to one  side.  You have sudden confusion.  You have trouble speaking (aphasia) or understanding.  You have sudden trouble seeing in one or both eyes.  You have sudden trouble walking.  You have dizziness.  You have a loss of balance or coordination.  You have a sudden, severe headache with no known cause.  You have new chest pain or an irregular heartbeat. Any of these symptoms may represent a serious problem that is an emergency. Do not wait to see if the symptoms will go away. Get medical help at once. Call your local emergency services (911 in [place] Do not drive yourself to the hospital.   This information is not intended to replace advice given to you by your health care provider. Make sure you discuss any questions you have with your health care provider.   Document Released: 10/09/2004 Document Revised: 09/22/2014 Document Reviewed: 03/04/2013 Elsevier Interactive Patient Education [employer].

## 2016-04-07 ENCOUNTER — Ambulatory Visit
Admission: RE | Admit: 2016-04-07 | Discharge: 2016-04-07 | Disposition: A | Discharge status: Home / Self Care | Payer: BC Managed Care – PPO | Source: Ambulatory Visit | Attending: Family Medicine | Admitting: Family Medicine

## 2016-04-07 ENCOUNTER — Encounter: Payer: Self-pay | Admitting: Family Medicine

## 2016-04-07 DIAGNOSIS — Z1231 Encounter for screening mammogram for malignant neoplasm of breast: Secondary | ICD-10-CM

## 2016-04-08 MED FILL — DILTIAZEM 24HR ER 240 MG CA: 240 | 30 days supply | Qty: 30 | Fill #0

## 2016-04-08 MED FILL — XARELTO 20 MG TABLET: 20 | 30 days supply | Qty: 30 | Fill #3

## 2016-04-10 ENCOUNTER — Telehealth: Payer: Self-pay | Admitting: Family Medicine

## 2016-04-10 DIAGNOSIS — H527 Unspecified disorder of refraction: Secondary | ICD-10-CM

## 2016-04-10 NOTE — Telephone Encounter (Signed)
Pt came into facility requesting to be referred out to eye doctor. Please f/u

## 2016-04-14 NOTE — Progress Notes (Deleted)
Cardiology Office Note    Date:  04/14/2016   ID:  [name], DOB [birth year], MRN [id number]  PCP:  Elizabeth Morale, MD  Cardiologist: Dr. Sallyanne Murphy    CC:  Follow s/p DCCV  History of Present Illness:  [name] is a 50 y.o. 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   Has a previous TIA and long history of noncompliance with meds and follow up. Xarelto prescribed at DC from hospital Jan 2016 and refilled by PCP July 2016.[name] She stopped it around Aug 2016 after seeing scary ads about Xarelto complications on TV.  She was admitted to hospital [id number] for acute embolic right hemispheric stroke with severe deficits s/p IV tPA while off anticoagulation. She did not require an embolectomy. Post tPA administration MRI showed small amount of asymptomatic hemorrhagic transformation and xaralto held in hospital. Plan want to aspirin 1 week, then resume Xarelto. Amiodarone given for afib with RVR.  She has NSVT during admission and BB was added.   The patient was in sinus rhythm per review of EKG 01/31/16 in ED for intermittent tingling and swelling to bilateral arms, feet, and face.  She was at her PCP office ([name]) on 03/07/16 for complete physical where she found to be tachycardic with heart rate of 140 on her vitals. Irregular rhythm on exam thus added to [facility] schedule for further evaluation. EKG showed afib. She was completely asymptomatic. She was set up for DCCV on 03/12/16 which was successful.  Today she presents to clinic for follow up.    Past Medical History:  Diagnosis Date  . Encounter for long-term (current) use of other medications   . Heart murmur   . Hypertension   . Paroxysmal a-fib ([name])   . Pneumonia 02/2011; 08/2011; 03/22/2014  . Shortness of breath   . Stroke [name])    of  mid right middle cerebral atery - recieved TPA  . Transient ischemic attack (TIA) 01/2013    Past Surgical History:  Procedure Laterality Date  . CARDIOVERSION N/A 02/18/2013   Procedure: CARDIOVERSION;  Surgeon: Elizabeth Klein, MD;  Location: [place] [facility];  Service: Cardiovascular;  Laterality: N/A;  . CARDIOVERSION N/A 02/23/2013   Procedure: CARDIOVERSION;  Surgeon: Elizabeth Man, MD;  Location: [place];  Service: Cardiovascular;  Laterality: N/A;  BESIDE CV  . CARDIOVERSION N/A 03/12/2016   Procedure: CARDIOVERSION;  Surgeon: Elizabeth Dresser, MD;  Location: [place];  Service: Cardiovascular;  Laterality: N/A;  . TEE WITHOUT CARDIOVERSION N/A 02/18/2013   Procedure: TRANSESOPHAGEAL ECHOCARDIOGRAM (TEE);  Surgeon: Elizabeth Klein, MD;  Location: [facility] ENDOSCOPY;  Service: Cardiovascular;  Laterality: N/A;  . TUBAL LIGATION  1992    Current Medications: Outpatient Medications Prior to Visit  Medication Sig Dispense Refill  . atorvastatin (LIPITOR) 20 MG tablet Take 1 tablet (20 mg total) by mouth daily at 6 PM. 30 tablet 2  . CARDIZEM LA 120 MG 24 hr tablet Take 2 tablets daily  3  . metoprolol tartrate (LOPRESSOR) 25 MG tablet Take 1 tablet (25 mg total) by mouth 2 (two) times daily. 180 tablet 3  . rivaroxaban (XARELTO) 20 MG TABS tablet Take 1 tablet (20 mg total) by mouth daily with supper. 90 tablet 3   No facility-administered medications prior to visit.      Allergies:  [name]   Social History   Social History  . Marital status: Single    Spouse name: N/A  . Number of children: N/A  . Years of education: N/A   Occupational History  . assembly line    Social History Main Topics  . Smoking status: Former Smoker    Years: 3.00    Types: Cigars    Quit date: 03/15/2013  . Smokeless tobacco: Never Used  . Alcohol use 1.2 - 1.8 oz/week    2 - 3 Glasses of wine per week     Comment: only on weekends  . Drug use: No  . Sexual activity: Not Currently    Birth control/  protection: None   Other Topics Concern  . Not on file   Social History Narrative  . No narrative on file     Family History:  The patient's family history includes Atrial fibrillation in her father and mother; Cancer in her mother; Hypertension in her father; Peripheral vascular disease in her father.     ROS:   Please see the history of present illness.    ROS All other systems reviewed and are negative.   PHYSICAL EXAM:   VS:  There were no vitals taken for this visit.   GEN: Well nourished, well developed, in no acute distress  HEENT: normal  Neck: no JVD, carotid bruits, or masses Cardiac: ***RRR; no murmurs, rubs, or gallops,no edema  Respiratory:  clear to auscultation bilaterally, normal work of breathing GI: soft, nontender, nondistended, + BS MS: no deformity or atrophy  Skin: warm and dry, no rash Neuro:  Alert and Oriented x 3, Strength and sensation are intact Psych: euthymic mood, full affect  Wt Readings from Last 3 Encounters:  04/03/16 196 lb 9.6 oz (89.2 kg)  03/07/16 194 lb (88 kg)  03/07/16 191 lb 3.2 oz (86.7 kg)      Studies/Labs Reviewed:   EKG:  EKG is*** ordered today.  The ekg ordered today demonstrates ***  Recent Labs: 11/03/2015: Magnesium 1.8 01/31/2016: ALT 27 03/07/2016: BUN 12; Creat 0.88; Hemoglobin 13.2; Platelets 222; Potassium 3.8; Sodium 140; TSH 1.33   Lipid Panel    Component Value Date/Time   CHOL 131 11/03/2015 0405   TRIG 48 11/03/2015 0405   HDL 32 (L) 11/03/2015 0405   CHOLHDL 4.1 11/03/2015 0405   VLDL 10 11/03/2015 0405   LDLCALC 89 11/03/2015 0405    Additional studies/ records that were reviewed today include:  Echocardiogram: 11/03/15 LV EF: 60%-65% Study Conclusions - Left ventricle: The cavity size was normal. Wall thickness was  increased in a pattern of moderate LVH. Systolic function was  normal. The estimated ejection fraction was in the range of 60%  to 65%. Wall motion was normal; there were no  regional wall  motion abnormalities. - Aortic valve: There was mild regurgitation. Valve area (VTI):  2.33 cm^2. Valve area (Vmax): 2.45 cm^2. - Mitral valve: There was mild regurgitation. - Left atrium: The atrium was severely dilated. - Right atrium: The atrium was severely dilated. - Atrial septum: No defect or patent foramen ovale was identified. - Pulmonary arteries: Systolic pressure was mildly increased. PA  peak pressure: 35 mm Hg (S). - Technically adequate study.    ASSESSMENT & PLAN:   PAF: s/p successful DCCV on 03/11/16. Maintaining NSR today ***. - CHADSVasc score 5 (HTN, DM, F sex, CVA). Compliant with Xarelto. Continue Cardizem LA 240mg  daily and Lopressor 25mg  BID  Hypertensive heart disease: BP stable  and well controlled today. Continue CCB and BB.   CVA: Hx of embolic stroke off anticoagulation. Now on Xarelto. Followed by Dr. Leonie Murphy  HLD: 11/03/2015: Cholesterol 131; HDL 32*; LDL Cholesterol 89; Triglycerides 48; VLDL 10. Continue statin. LDL goal <70.  DMT2: Hg A1c 6.5 in 10/2015   Medication Adjustments/Labs and Tests Ordered: Current medicines are reviewed at length with the patient today.  Concerns regarding medicines are outlined above.  Medication changes, Labs and Tests ordered today are listed in the Patient Instructions below. There are no Patient Instructions on file for this visit.   Signed, Elizabeth Form, PA-C  04/14/2016 8:15 PM    [facility] [address], [place], [place]  [address] Phone: [phone]; Fax: [phone]

## 2016-04-15 DIAGNOSIS — H527 Unspecified disorder of refraction: Secondary | ICD-10-CM | POA: Insufficient documentation

## 2016-04-15 NOTE — Telephone Encounter (Signed)
Will forward request to PCP

## 2016-04-15 NOTE — Telephone Encounter (Signed)
Done

## 2016-04-16 ENCOUNTER — Ambulatory Visit: Payer: BC Managed Care – PPO | Admitting: Physician Assistant

## 2016-04-16 ENCOUNTER — Encounter: Payer: Self-pay | Admitting: Family Medicine

## 2016-05-01 ENCOUNTER — Encounter: Payer: Self-pay | Admitting: Nurse Practitioner

## 2016-05-01 ENCOUNTER — Ambulatory Visit: Payer: BC Managed Care – PPO | Admitting: Nurse Practitioner

## 2016-05-01 DIAGNOSIS — R0989 Other specified symptoms and signs involving the circulatory and respiratory systems: Secondary | ICD-10-CM

## 2016-05-09 MED FILL — XARELTO 20 MG TABLET: 20 | 30 days supply | Qty: 30 | Fill #4

## 2016-05-09 MED FILL — METOPROLOL TARTRATE 25 MG T: 25 | 30 days supply | Qty: 60 | Fill #0

## 2016-05-09 MED FILL — DILTIAZEM 24HR ER 240 MG CA: 240 | 30 days supply | Qty: 30 | Fill #1

## 2016-05-09 MED FILL — ATORVASTATIN 20 MG TABLET: 20 | 30 days supply | Qty: 30 | Fill #2

## 2016-05-20 ENCOUNTER — Encounter: Payer: Self-pay | Admitting: Family Medicine

## 2016-05-20 ENCOUNTER — Ambulatory Visit: Payer: BC Managed Care – PPO | Attending: Family Medicine | Admitting: Family Medicine

## 2016-05-20 VITALS — BP 132/89 | HR 82 | Temp 98.3°F | Ht 63.0 in | Wt 198.0 lb

## 2016-05-20 DIAGNOSIS — M25562 Pain in left knee: Secondary | ICD-10-CM

## 2016-05-20 MED ORDER — TRAMADOL HCL 50 MG PO TABS: 50.0000 mg | ORAL_TABLET | Freq: Two times a day (BID) | ORAL | 0 refills | Status: DC | PRN

## 2016-05-20 MED FILL — traMADol HCL 50 MG TABS: 50 | 30 days supply | Qty: 60 | Fill #0

## 2016-05-20 NOTE — Patient Instructions (Signed)

## 2016-05-20 NOTE — Progress Notes (Signed)
Wt 196 lb

## 2016-05-20 NOTE — Progress Notes (Signed)
Subjective:  Patient ID: [name], female    DOB: [birth year]  Age: 50 y.o. MRN: [id number]  CC: Left knee pain  HPI [name] is a 50 year old female with a history of atrial fibrillation, hypertension who comes into the clinic with a 4 day history of left knee pain and swelling with no prior history of trauma. She applied ice but has not used any OTC medications. Pain is at a 3/10 at this time and is located in the medial border of the patella.  Past Medical History:  Diagnosis Date  . Essential hypertension   . Heart murmur    a. 10/2015 Echo: EF 60-65%, no rwma, mild AI/MR, sev dil LA/RA, PASP 84mmHg.  [name] Noncompliance   . NSVT (nonsustained ventricular tachycardia) ([place])    a. 10/2015 during admission for CVA/AF.  [name] Paroxysmal a-fib ([place])    a. CHA2DS2VASC = 4-->Xarelto;  b. 02/2016 Successful DCCV.  [name] Pneumonia 02/2011; 08/2011; 03/22/2014  . Stroke [facility])    a. 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  . Transient ischemic attack (TIA) 01/2013    Past Surgical History:  Procedure Laterality Date  . CARDIOVERSION N/A 02/18/2013   Procedure: CARDIOVERSION;  Surgeon: Sanda Klein, MD;  Location: [place] ENDOSCOPY;  Service: Cardiovascular;  Laterality: N/A;  . CARDIOVERSION N/A 02/23/2013   Procedure: CARDIOVERSION;  Surgeon: Leonie Man, MD;  Location: [place];  Service: Cardiovascular;  Laterality: N/A;  BESIDE CV  . CARDIOVERSION N/A 03/12/2016   Procedure: CARDIOVERSION;  Surgeon: Larey Dresser, MD;  Location: [place];  Service: Cardiovascular;  Laterality: N/A;  . TEE WITHOUT CARDIOVERSION N/A 02/18/2013   Procedure: TRANSESOPHAGEAL ECHOCARDIOGRAM (TEE);  Surgeon: Sanda Klein, MD;  Location: [facility] ENDOSCOPY;  Service: Cardiovascular;  Laterality: N/A;  . TUBAL LIGATION  1992      Outpatient Medications Prior to Visit  Medication Sig Dispense Refill  . atorvastatin (LIPITOR) 20 MG  tablet Take 1 tablet (20 mg total) by mouth daily at 6 PM. 30 tablet 2  . CARDIZEM LA 120 MG 24 hr tablet Take 2 tablets daily  3  . metoprolol tartrate (LOPRESSOR) 25 MG tablet Take 1 tablet (25 mg total) by mouth 2 (two) times daily. 180 tablet 3  . rivaroxaban (XARELTO) 20 MG TABS tablet Take 1 tablet (20 mg total) by mouth daily with supper. 90 tablet 3   No facility-administered medications prior to visit.     ROS Review of Systems  Constitutional: Negative for activity change and appetite change.  HENT: Negative for sinus pressure and sore throat.   Respiratory: Negative for chest tightness, shortness of breath and wheezing.   Cardiovascular: Negative for chest pain and palpitations.  Gastrointestinal: Negative for abdominal distention, abdominal pain and constipation.  Genitourinary: Negative.   Musculoskeletal:       See hpi  Psychiatric/Behavioral: Negative for behavioral problems and dysphoric mood.    Objective:  BP 132/89 (BP Location: Right Arm, Patient Position: Sitting, Cuff Size: Small)   Pulse 82   Temp 98.3 F (36.8 C) (Oral)   Ht 5\' 3"  (1.6 m)   Wt 198 lb (89.8 kg)   SpO2 99%   BMI 35.07 kg/m   BP/Weight 05/20/2016 04/03/2016 99991111  Systolic BP [id number]  Diastolic BP 89 91 74  Wt. (Lbs) 198 196.6 -  BMI 35.07 34.28 -      Physical Exam  Constitutional: She is oriented to  person, place, and time. She appears well-developed and well-nourished.  Cardiovascular: Normal rate, normal heart sounds and intact distal pulses.   No murmur heard. Pulmonary/Chest: Effort normal and breath sounds normal. She has no wheezes. She has no rales. She exhibits no tenderness.  Abdominal: Soft. Bowel sounds are normal. She exhibits no distension and no mass. There is no tenderness.  Musculoskeletal: She exhibits edema (Mild left knee edema) and tenderness (Slight tenderness to palpation and range of motion of the left knee).  Neurological: She is alert and oriented to  person, place, and time.     Assessment & Plan:   1. Left knee pain Would love to place on NSAIDs however she is on Xarelto. Placed on tramadol Apply ice and use left knee brace We'll reassess at next visit and order knee x-ray if persisting. She has been provided a note to to return to work tomorrow -She works in [employer]   Meds ordered this encounter  Medications  . traMADol (ULTRAM) 50 MG tablet    Sig: Take 1 tablet (50 mg total) by mouth every 12 (twelve) hours as needed.    Dispense:  60 tablet    Refill:  0    Follow-up: Return in about 1 month (around 06/19/2016) for follow up on left knee pain.   Arnoldo Morale MD

## 2016-06-05 ENCOUNTER — Other Ambulatory Visit: Payer: Self-pay | Admitting: Internal Medicine

## 2016-06-05 MED FILL — XARELTO 20 MG TABLET: 20 | 30 days supply | Qty: 30 | Fill #5

## 2016-06-05 MED FILL — METOPROLOL TARTRATE 25 MG T: 25 | 30 days supply | Qty: 60 | Fill #1

## 2016-06-06 ENCOUNTER — Telehealth: Payer: Self-pay | Admitting: Cardiology

## 2016-06-06 MED FILL — ATORVASTATIN 20 MG TABLET: 20 | 30 days supply | Qty: 30 | Fill #0

## 2016-06-06 NOTE — Telephone Encounter (Signed)
OK to switch to either Cartia XL or diltiazem CD 240 mg daily

## 2016-06-06 NOTE — Telephone Encounter (Signed)
°  New Prob   States pts insurance will not cover Cardizem LA 240 mg. Requesting Cardia XL 240 mg or Diltiazem CD 240 mg. Please advise.

## 2016-06-06 NOTE — Telephone Encounter (Signed)
Routing to Dr. Sallyanne Kuster for approval to switch.

## 2016-06-06 NOTE — Telephone Encounter (Signed)
Left detailed message w/ pharmacy at [place] that patient may change to Cartia XL or Diltiazem CD, both at 240 mg strength. Advised to call office back if needed anything further.

## 2016-06-09 ENCOUNTER — Other Ambulatory Visit: Payer: Self-pay

## 2016-06-09 MED ORDER — DILTIAZEM HCL ER COATED BEADS 240 MG PO CP24: 240.0000 mg | ORAL_CAPSULE | Freq: Every day | ORAL | 3 refills | Status: DC

## 2016-06-09 MED FILL — DILTIAZEM 24HR ER 240 MG CA: 240 | 30 days supply | Qty: 30 | Fill #0

## 2016-07-02 ENCOUNTER — Ambulatory Visit: Payer: BC Managed Care – PPO | Attending: Family Medicine

## 2016-07-02 DIAGNOSIS — Z111 Encounter for screening for respiratory tuberculosis: Secondary | ICD-10-CM

## 2016-07-02 MED ORDER — TUBERCULIN PPD 5 UNIT/0.1ML ID SOLN
5.0000 [IU] | Freq: Once | INTRADERMAL | Status: AC
  Administered 2016-07-02: 5 [IU] via INTRADERMAL

## 2016-07-04 ENCOUNTER — Ambulatory Visit: Payer: BC Managed Care – PPO | Attending: Family Medicine

## 2016-07-04 DIAGNOSIS — Z111 Encounter for screening for respiratory tuberculosis: Secondary | ICD-10-CM

## 2016-07-04 NOTE — Progress Notes (Signed)
Patient here for tb reading. PPD Reading Note PPD read and results entered in [place]. Result: 0 mm induration. Interpretation: negative. Priscille Heidelberg, RN, BSN

## 2016-07-04 NOTE — Patient Instructions (Signed)
Patient here for PPD reading.

## 2016-07-10 ENCOUNTER — Other Ambulatory Visit: Payer: Self-pay | Admitting: Family Medicine

## 2016-07-10 MED FILL — ATORVASTATIN 20 MG TABLET: 20 | 30 days supply | Qty: 30 | Fill #1

## 2016-07-10 MED FILL — DILTIAZEM 24HR ER 240 MG CA: 240 | 30 days supply | Qty: 30 | Fill #1

## 2016-07-10 MED FILL — XARELTO 20 MG TABLET: 20 | 30 days supply | Qty: 30 | Fill #6

## 2016-07-10 MED FILL — METOPROLOL TARTRATE 25 MG T: 25 | 30 days supply | Qty: 60 | Fill #2

## 2016-07-11 ENCOUNTER — Telehealth: Payer: Self-pay | Admitting: Family Medicine

## 2016-07-11 ENCOUNTER — Telehealth: Payer: Self-pay

## 2016-07-11 NOTE — Telephone Encounter (Signed)
Patient called office asking to speak with nurse regarding her medication. Patient doesn't understand why she needs to come and pick up prescription when PCP can just call it in to our pharmacy. I explained to patient the protocols for controlled medication. Pt prefers to speak with nurse. Please follow up.

## 2016-07-11 NOTE — Telephone Encounter (Signed)
LVM for patient that requested medication is ready for pick up at the front desk.

## 2016-07-14 NOTE — Telephone Encounter (Signed)
Writer called patient back and LVM that tramadol is a controlled medication and by law a prescription needs to be printed and signed and patient has to pick it up.  Encouraged patient to call back with any questions.

## 2016-07-15 MED FILL — traMADol HCL 50 MG TABS: 50 | 30 days supply | Qty: 60 | Fill #0

## 2016-08-13 ENCOUNTER — Other Ambulatory Visit: Payer: Self-pay | Admitting: Family Medicine

## 2016-08-13 MED FILL — DILTIAZEM 24HR ER 240 MG CA: 240 | 30 days supply | Qty: 30 | Fill #2

## 2016-08-13 MED FILL — METOPROLOL TARTRATE 25 MG T: 25 | 30 days supply | Qty: 60 | Fill #3

## 2016-08-13 MED FILL — XARELTO 20 MG TABLET: 20 | 30 days supply | Qty: 30 | Fill #7

## 2016-08-13 MED FILL — ATORVASTATIN 20 MG TABLET: 20 | 30 days supply | Qty: 30 | Fill #2

## 2016-08-18 ENCOUNTER — Telehealth: Payer: Self-pay | Admitting: Family Medicine

## 2016-08-18 NOTE — Telephone Encounter (Signed)
Patient called the office to speak with PCP regarding medication. Pt wants to know if the dosage of Tramadol can be increased since she continues to experience pain. Please follow up. Pt can be contacted after 2pm.  Thank you.

## 2016-08-19 ENCOUNTER — Telehealth: Payer: Self-pay

## 2016-08-19 MED FILL — traMADol HCL 50 MG TABS: 50 | 30 days supply | Qty: 60 | Fill #0

## 2016-08-19 NOTE — Telephone Encounter (Signed)
Writer called patient back and LVM advising her that MD stated that she may increase the tramadol to every 8 hours, however she is to make an appt as soon as possible for she will be running out of medication. Writer encouraged patient to call back with questions.

## 2016-08-19 NOTE — Telephone Encounter (Signed)
She can increase the frequency of tramadol to every 8 hours but will need to come in for an office visit to see me as she will be running out soon.

## 2016-08-20 ENCOUNTER — Ambulatory Visit: Payer: BC Managed Care – PPO | Admitting: Family Medicine

## 2016-09-11 ENCOUNTER — Other Ambulatory Visit: Payer: Self-pay | Admitting: Family Medicine

## 2016-09-11 ENCOUNTER — Telehealth: Payer: Self-pay

## 2016-09-11 MED FILL — DILTIAZEM 24HR ER 240 MG CA: 240 | 30 days supply | Qty: 30 | Fill #3

## 2016-09-11 MED FILL — METOPROLOL TARTRATE 25 MG T: 25 | 30 days supply | Qty: 60 | Fill #4

## 2016-09-11 MED FILL — XARELTO 20 MG TABLET: 20 | 30 days supply | Qty: 30 | Fill #8

## 2016-09-11 MED FILL — ATORVASTATIN 20 MG TABLET: 20 | 30 days supply | Qty: 30 | Fill #0

## 2016-09-11 NOTE — Telephone Encounter (Signed)
Dr. Jarold Song refilled patient's tramadol however did not change the frequency of the dose.  Patient is to continue taking the medication every 12 hours. Writer LVM with patient that the prescription is ready for pick up and that she would need to schedule an appt with MD if she needed to increase the frequency of taking the medication. Writer placed the prescription at the front desk in the medication book.

## 2016-09-16 MED FILL — traMADol HCL 50 MG TABS: 50 | 30 days supply | Qty: 60 | Fill #0

## 2016-09-24 ENCOUNTER — Ambulatory Visit: Payer: BC Managed Care – PPO | Attending: Family Medicine | Admitting: Family Medicine

## 2016-09-24 ENCOUNTER — Encounter: Payer: Self-pay | Admitting: Family Medicine

## 2016-09-24 VITALS — BP 117/81 | HR 97 | Temp 97.8°F | Ht 63.5 in | Wt 191.6 lb

## 2016-09-24 DIAGNOSIS — I4892 Unspecified atrial flutter: Secondary | ICD-10-CM | POA: Diagnosis present

## 2016-09-24 DIAGNOSIS — I482 Chronic atrial fibrillation, unspecified: Secondary | ICD-10-CM

## 2016-09-24 DIAGNOSIS — G4489 Other headache syndrome: Secondary | ICD-10-CM | POA: Diagnosis not present

## 2016-09-24 DIAGNOSIS — Z9851 Tubal ligation status: Secondary | ICD-10-CM | POA: Diagnosis not present

## 2016-09-24 DIAGNOSIS — Z9114 Patient's other noncompliance with medication regimen: Secondary | ICD-10-CM | POA: Insufficient documentation

## 2016-09-24 DIAGNOSIS — I63411 Cerebral infarction due to embolism of right middle cerebral artery: Secondary | ICD-10-CM | POA: Diagnosis not present

## 2016-09-24 DIAGNOSIS — Z7901 Long term (current) use of anticoagulants: Secondary | ICD-10-CM | POA: Diagnosis not present

## 2016-09-24 DIAGNOSIS — M79642 Pain in left hand: Secondary | ICD-10-CM

## 2016-09-24 DIAGNOSIS — Z880 Allergy status to penicillin: Secondary | ICD-10-CM | POA: Insufficient documentation

## 2016-09-24 DIAGNOSIS — I480 Paroxysmal atrial fibrillation: Secondary | ICD-10-CM | POA: Insufficient documentation

## 2016-09-24 DIAGNOSIS — M25549 Pain in joints of unspecified hand: Secondary | ICD-10-CM | POA: Diagnosis not present

## 2016-09-24 DIAGNOSIS — Z8673 Personal history of transient ischemic attack (TIA), and cerebral infarction without residual deficits: Secondary | ICD-10-CM | POA: Insufficient documentation

## 2016-09-24 DIAGNOSIS — Z8701 Personal history of pneumonia (recurrent): Secondary | ICD-10-CM | POA: Insufficient documentation

## 2016-09-24 DIAGNOSIS — I10 Essential (primary) hypertension: Secondary | ICD-10-CM | POA: Insufficient documentation

## 2016-09-24 DIAGNOSIS — M79641 Pain in right hand: Secondary | ICD-10-CM

## 2016-09-24 MED ORDER — ATORVASTATIN CALCIUM 20 MG PO TABS: ORAL_TABLET | ORAL | 3 refills | Status: DC

## 2016-09-24 MED ORDER — DILTIAZEM HCL ER COATED BEADS 240 MG PO CP24: 240.0000 mg | ORAL_CAPSULE | Freq: Every day | ORAL | 3 refills | Status: DC

## 2016-09-24 MED ORDER — BUTALBITAL-APAP-CAFFEINE 50-325-40 MG PO TABS: 1.0000 | ORAL_TABLET | Freq: Two times a day (BID) | ORAL | 1 refills | Status: DC | PRN

## 2016-09-24 MED ORDER — RIVAROXABAN 20 MG PO TABS: 20.0000 mg | ORAL_TABLET | Freq: Every day | ORAL | 3 refills | Status: DC

## 2016-09-24 MED ORDER — METOPROLOL TARTRATE 25 MG PO TABS: 25.0000 mg | ORAL_TABLET | Freq: Two times a day (BID) | ORAL | 3 refills | Status: DC

## 2016-09-24 NOTE — Progress Notes (Signed)
Medications refills on all

## 2016-09-24 NOTE — Progress Notes (Signed)
Subjective:  Patient ID: [name], female    DOB: 1966/01/22  Age: 51 y.o. MRN: [id number]  CC: Headache and Hand Pain (joints swelling and pain)   HPI [name] is a 51 year old female with a history of atrial fibrillation/atrial flutter (status post cardioversion in 02/2016 currently on rate control and anticoagulation), cerebral infarction due to embolism of right middle cerebral artery status post IV TPA (in 10/2015) with no residual deficits who comes into the clinic for a follow-up visit.  She endorses compliance with all her medications and has not seen cardiology in the last 6 months. She has an upcoming appointment with neurology next month. She complains of intermittent headaches which she has had ever since the stroke he denies nausea, vomiting or blurry vision. Headaches occur every other day but are not disabling and do not affect her ability to function. Denies sinus symptoms.  She complains of intermittent pain in her metacarpophalangeal joints and occasional swelling which is worse in the morning and with cold weather. She has been taking tramadol which she was given for knee pain due to the fact that she is unable to use NSAIDs due to increased risk of bleeding when used with Xarelto.  Past Medical History:  Diagnosis Date  . Essential hypertension   . Heart murmur    a. 10/2015 Echo: EF 60-65%, no rwma, mild AI/MR, sev dil LA/RA, PASP 54mmHg.  [name] Noncompliance   . NSVT (nonsustained ventricular tachycardia) ([place])    a. 10/2015 during admission for CVA/AF.  [name] Paroxysmal a-fib ([place])    a. CHA2DS2VASC = 4-->Xarelto;  b. 02/2016 Successful DCCV.  [name] Pneumonia 02/2011; 08/2011; 03/22/2014  . Stroke [facility])    a. 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  . Transient ischemic attack (TIA) 01/2013    Past Surgical History:  Procedure Laterality Date  . CARDIOVERSION N/A 02/18/2013   Procedure:  CARDIOVERSION;  Surgeon: Sanda Klein, MD;  Location: [facility]OSCOPY;  Service: Cardiovascular;  Laterality: N/A;  . CARDIOVERSION N/A 02/23/2013   Procedure: CARDIOVERSION;  Surgeon: Leonie Man, MD;  Location: [place];  Service: Cardiovascular;  Laterality: N/A;  BESIDE CV  . CARDIOVERSION N/A 03/12/2016   Procedure: CARDIOVERSION;  Surgeon: Larey Dresser, MD;  Location: [place];  Service: Cardiovascular;  Laterality: N/A;  . TEE WITHOUT CARDIOVERSION N/A 02/18/2013   Procedure: TRANSESOPHAGEAL ECHOCARDIOGRAM (TEE);  Surgeon: Sanda Klein, MD;  Location: [facility] ENDOSCOPY;  Service: Cardiovascular;  Laterality: N/A;  . TUBAL LIGATION  1992    Allergies  Allergen Reactions  . Penicillins Hives and Other (See Comments)    Unknown  Has patient had a PCN reaction causing immediate rash, facial/tongue/throat swelling, SOB or lightheadedness with hypotension: No Has patient had a PCN reaction causing severe rash involving mucus membranes or skin necrosis: No Has patient had a PCN reaction that required hospitalization No Has patient had a PCN reaction occurring within the last 10 years: No If all of the above answers are "NO", then may proceed with Cephalosporin use.     Outpatient Medications Prior to Visit  Medication Sig Dispense Refill  . traMADol (ULTRAM) 50 MG tablet TAKE 1 TABLET BY MOUTH EVERY 12 HOURS AS NEEDED. 60 tablet 0  . atorvastatin (LIPITOR) 20 MG tablet TAKE 1 TABLET BY MOUTH DAILY AT 6 PM. 30 tablet 2  . diltiazem (CARDIZEM CD) 240 MG 24 hr capsule Take 1 capsule (240 mg total) by  mouth daily. 60 capsule 3  . metoprolol tartrate (LOPRESSOR) 25 MG tablet Take 1 tablet (25 mg total) by mouth 2 (two) times daily. 180 tablet 3  . rivaroxaban (XARELTO) 20 MG TABS tablet Take 1 tablet (20 mg total) by mouth daily with supper. 90 tablet 3   No facility-administered medications prior to visit.     ROS Review of Systems  Constitutional: Negative for activity change,  appetite change and fatigue.  HENT: Negative for congestion, sinus pressure and sore throat.   Eyes: Negative for visual disturbance.  Respiratory: Negative for cough, chest tightness, shortness of breath and wheezing.   Cardiovascular: Negative for chest pain and palpitations.  Gastrointestinal: Negative for abdominal distention, abdominal pain and constipation.  Endocrine: Negative for polydipsia.  Genitourinary: Negative for dysuria and frequency.  Musculoskeletal:       See hpi  Skin: Negative for rash.  Neurological: Negative for tremors, light-headedness and numbness.  Hematological: Does not bruise/bleed easily.  Psychiatric/Behavioral: Negative for agitation and behavioral problems.    Objective:  BP 117/81 (BP Location: Right Arm, Patient Position: Sitting, Cuff Size: Small)   Pulse 97   Temp 97.8 F (36.6 C) (Oral)   Ht 5' 3.5" (1.613 m)   Wt 191 lb 9.6 oz (86.9 kg)   SpO2 98%   BMI 33.41 kg/m   BP/Weight 09/24/2016 05/20/2016 [id number]  Systolic BP 123XX123 [id number]  Diastolic BP 81 89 91  Wt. (Lbs) 191.6 198 196.6  BMI 33.41 35.07 34.28      Physical Exam  Constitutional: She is oriented to person, place, and time. She appears well-developed and well-nourished.  HENT:  Right Ear: External ear normal.  Left Ear: External ear normal.  Mouth/Throat: Oropharynx is clear and moist.  No sinus tenderness  Cardiovascular: Normal rate, normal heart sounds and intact distal pulses.   No murmur heard. Pulmonary/Chest: Effort normal and breath sounds normal. She has no wheezes. She has no rales. She exhibits no tenderness.  Abdominal: Soft. Bowel sounds are normal. She exhibits no distension and no mass. There is no tenderness.  Musculoskeletal: Normal range of motion. She exhibits edema (mild edema of MCP joints). She exhibits no tenderness or deformity.  Able to make fists in both hands  Neurological: She is alert and oriented to person, place, and time.     Assessment &  Plan:   1. Essential hypertension Controlled - COMPLETE METABOLIC PANEL WITH GFR; Future - Lipid panel; Future  2. Cerebral infarction due to embolism of right middle cerebral artery (HCC) s/p IV tPA No residual deficits - atorvastatin (LIPITOR) 20 MG tablet; TAKE 1 TABLET BY MOUTH DAILY AT 6 PM.  Dispense: 30 tablet; Refill: 3  3. Chronic atrial fibrillation/ Atrial flutter (HCC) Status post cardioversion in 02/2016 Rate control with metoprolol and Diltiazem, anticoagulation with Xarelto - rivaroxaban (XARELTO) 20 MG TABS tablet; Take 1 tablet (20 mg total) by mouth daily with supper.  Dispense: 90 tablet; Refill: 3 - metoprolol tartrate (LOPRESSOR) 25 MG tablet; Take 1 tablet (25 mg total) by mouth 2 (two) times daily.  Dispense: 180 tablet; Refill: 3 - diltiazem (CARDIZEM CD) 240 MG 24 hr capsule; Take 1 capsule (240 mg total) by mouth daily.  Dispense: 30 capsule; Refill: 3  4. Other headache syndrome Could be post stroke headache Advised to discuss with neurology at her upcoming appointment - butalbital-acetaminophen-caffeine (FIORICET, ESGIC) 50-325-40 MG tablet; Take 1 tablet by mouth every 12 (twelve) hours as needed for headache.  Dispense: 40 tablet;  Refill: 1  5. Hand pain Likely underlying osteoarthritis Use tramadol as needed  Meds ordered this encounter  Medications  . rivaroxaban (XARELTO) 20 MG TABS tablet    Sig: Take 1 tablet (20 mg total) by mouth daily with supper.    Dispense:  90 tablet    Refill:  3  . metoprolol tartrate (LOPRESSOR) 25 MG tablet    Sig: Take 1 tablet (25 mg total) by mouth 2 (two) times daily.    Dispense:  180 tablet    Refill:  3  . diltiazem (CARDIZEM CD) 240 MG 24 hr capsule    Sig: Take 1 capsule (240 mg total) by mouth daily.    Dispense:  30 capsule    Refill:  3  . atorvastatin (LIPITOR) 20 MG tablet    Sig: TAKE 1 TABLET BY MOUTH DAILY AT 6 PM.    Dispense:  30 tablet    Refill:  3  . butalbital-acetaminophen-caffeine  (FIORICET, ESGIC) 50-325-40 MG tablet    Sig: Take 1 tablet by mouth every 12 (twelve) hours as needed for headache.    Dispense:  40 tablet    Refill:  1    Follow-up: Return in about 3 months (around 12/23/2016) for follow up on hypertension.   Arnoldo Morale MD

## 2016-09-25 MED FILL — BUTALB-ACETAMIN-CAFF 50-325: 50-325-40 | 20 days supply | Qty: 40 | Fill #0

## 2016-10-02 ENCOUNTER — Other Ambulatory Visit: Payer: BC Managed Care – PPO

## 2016-10-06 ENCOUNTER — Ambulatory Visit: Payer: BC Managed Care – PPO | Admitting: Nurse Practitioner

## 2016-10-07 ENCOUNTER — Telehealth: Payer: Self-pay | Admitting: *Deleted

## 2016-10-07 ENCOUNTER — Ambulatory Visit: Payer: BC Managed Care – PPO | Admitting: Nurse Practitioner

## 2016-10-07 ENCOUNTER — Ambulatory Visit: Payer: Self-pay | Admitting: Nurse Practitioner

## 2016-10-07 NOTE — Telephone Encounter (Signed)
Patient arrived to office without her co-pay.

## 2016-10-15 ENCOUNTER — Other Ambulatory Visit: Payer: Self-pay | Admitting: Family Medicine

## 2016-10-21 ENCOUNTER — Ambulatory Visit: Payer: Self-pay | Admitting: Nurse Practitioner

## 2016-10-21 ENCOUNTER — Telehealth: Payer: Self-pay

## 2016-10-21 MED FILL — ATORVASTATIN 20 MG TABLET: 20 | 30 days supply | Qty: 30 | Fill #0

## 2016-10-21 MED FILL — XARELTO 20 MG TABLET: 20 | 30 days supply | Qty: 30 | Fill #0

## 2016-10-21 MED FILL — DILTIAZEM 24HR ER 240 MG CA: 240 | 30 days supply | Qty: 30 | Fill #0

## 2016-10-21 MED FILL — BUTALB-ACETAMIN-CAFF 50-325: 50-325-40 | 20 days supply | Qty: 40 | Fill #1

## 2016-10-21 MED FILL — METOPROLOL TARTRATE 25 MG T: 25 | 30 days supply | Qty: 60 | Fill #0

## 2016-10-21 NOTE — Telephone Encounter (Signed)
Patient did not show to appt today  

## 2016-10-22 ENCOUNTER — Other Ambulatory Visit: Payer: Self-pay

## 2016-10-22 ENCOUNTER — Encounter: Payer: Self-pay | Admitting: Nurse Practitioner

## 2016-10-22 DIAGNOSIS — D164 Benign neoplasm of bones of skull and face: Secondary | ICD-10-CM

## 2016-10-22 DIAGNOSIS — D165 Benign neoplasm of lower jaw bone: Secondary | ICD-10-CM

## 2016-10-22 MED ORDER — TRAMADOL HCL 50 MG PO TABS: 50.0000 mg | ORAL_TABLET | Freq: Two times a day (BID) | ORAL | 0 refills | Status: DC | PRN

## 2016-11-13 ENCOUNTER — Other Ambulatory Visit: Payer: Self-pay | Admitting: Family Medicine

## 2016-11-13 DIAGNOSIS — D164 Benign neoplasm of bones of skull and face: Secondary | ICD-10-CM

## 2016-11-13 DIAGNOSIS — D165 Benign neoplasm of lower jaw bone: Secondary | ICD-10-CM

## 2016-11-13 DIAGNOSIS — G4489 Other headache syndrome: Secondary | ICD-10-CM

## 2016-11-13 MED FILL — DILTIAZEM 24HR CD 240 MG CA: 240 | 30 days supply | Qty: 30 | Fill #1

## 2016-11-13 MED FILL — ATORVASTATIN 20 MG TABLET: 20 | 30 days supply | Qty: 30 | Fill #1

## 2016-11-13 MED FILL — METOPROLOL TARTRATE 25 MG T: 25 | 30 days supply | Qty: 60 | Fill #1

## 2016-11-19 ENCOUNTER — Other Ambulatory Visit: Payer: Self-pay | Admitting: Family Medicine

## 2016-11-19 DIAGNOSIS — D164 Benign neoplasm of bones of skull and face: Secondary | ICD-10-CM

## 2016-11-19 DIAGNOSIS — G4489 Other headache syndrome: Secondary | ICD-10-CM

## 2016-11-19 DIAGNOSIS — D165 Benign neoplasm of lower jaw bone: Secondary | ICD-10-CM

## 2016-11-19 MED FILL — XARELTO 20 MG TABLET: 20 | 30 days supply | Qty: 30 | Fill #1

## 2016-11-20 ENCOUNTER — Telehealth: Payer: Self-pay

## 2016-11-20 NOTE — Telephone Encounter (Signed)
Writer called patient to let her know that the tramadol and fioricet prescription are waiting for her pick up at the front desk.  An appt was also secured for patient per patient's request next week.

## 2016-11-21 MED FILL — BUTALB-ACETAMIN-CAFF 50-325: 50-325-40 | 20 days supply | Qty: 40 | Fill #0

## 2016-11-21 MED FILL — traMADol HCL 50 MG TABS: 50 | 30 days supply | Qty: 60 | Fill #0

## 2016-11-24 ENCOUNTER — Ambulatory Visit: Payer: BC Managed Care – PPO | Admitting: Family Medicine

## 2016-12-04 ENCOUNTER — Ambulatory Visit: Payer: BC Managed Care – PPO | Admitting: Family Medicine

## 2016-12-22 ENCOUNTER — Telehealth: Payer: Self-pay

## 2016-12-22 MED FILL — DILTIAZEM 24HR CD 240 MG CA: 240 | 30 days supply | Qty: 30 | Fill #2

## 2016-12-22 MED FILL — XARELTO 20 MG TABLET: 20 | 30 days supply | Qty: 30 | Fill #2

## 2016-12-22 MED FILL — METOPROLOL TARTRATE 25 MG T: 25 | 30 days supply | Qty: 60 | Fill #2

## 2016-12-22 MED FILL — BUTALB-ACETAMIN-CAFF 50-325: 50-325-40 | 20 days supply | Qty: 40 | Fill #1

## 2016-12-22 MED FILL — ATORVASTATIN 20 MG TABLET: 20 | 30 days supply | Qty: 30 | Fill #2

## 2016-12-22 NOTE — Telephone Encounter (Signed)
Called to find out if patient wants to have colonoscopy scheduled

## 2016-12-26 ENCOUNTER — Telehealth: Payer: Self-pay | Admitting: Family Medicine

## 2016-12-26 DIAGNOSIS — D165 Benign neoplasm of lower jaw bone: Secondary | ICD-10-CM

## 2016-12-26 DIAGNOSIS — D164 Benign neoplasm of bones of skull and face: Secondary | ICD-10-CM

## 2016-12-26 MED ORDER — TRAMADOL HCL 50 MG PO TABS: 50.0000 mg | ORAL_TABLET | Freq: Two times a day (BID) | ORAL | 2 refills | Status: DC | PRN

## 2016-12-26 NOTE — Telephone Encounter (Signed)
Patient came to the office to request medication refill for traMADol (ULTRAM) 50 MG tablet.   Thank you.

## 2016-12-26 NOTE — Telephone Encounter (Signed)
Refilled

## 2016-12-29 NOTE — Telephone Encounter (Signed)
Called and spoke with patient informing her that Tramadol has been refilled.

## 2016-12-30 MED FILL — traMADol HCL 50 MG TABS: 50 | 30 days supply | Qty: 60 | Fill #0

## 2016-12-30 NOTE — Telephone Encounter (Signed)
Left message on voicemail for patient to p/u rx between hours of 0830- 5:30pm.

## 2017-01-20 ENCOUNTER — Other Ambulatory Visit: Payer: Self-pay | Admitting: Family Medicine

## 2017-01-20 DIAGNOSIS — G4489 Other headache syndrome: Secondary | ICD-10-CM

## 2017-01-20 MED FILL — DILTIAZEM 24HR CD 240 MG CA: 240 | 30 days supply | Qty: 30 | Fill #3

## 2017-01-20 MED FILL — METOPROLOL TARTRATE 25 MG T: 25 | 30 days supply | Qty: 60 | Fill #3

## 2017-01-20 MED FILL — ATORVASTATIN 20 MG TABLET: 20 | 30 days supply | Qty: 30 | Fill #3

## 2017-01-20 MED FILL — XARELTO 20 MG TABLET: 20 | 30 days supply | Qty: 30 | Fill #3

## 2017-01-22 ENCOUNTER — Telehealth: Payer: Self-pay

## 2017-01-22 NOTE — Telephone Encounter (Signed)
Clld pt - advsd Rx for Fioricet will be upfront ready for her to p/u.  Pt stated she is having right arm pain x 1 mth and it is getting worse.  Scheduled pt for ov on  05/17//18 at 10:30 am for the arm pain.

## 2017-01-27 ENCOUNTER — Other Ambulatory Visit: Payer: Self-pay | Admitting: Family Medicine

## 2017-01-27 DIAGNOSIS — G4489 Other headache syndrome: Secondary | ICD-10-CM

## 2017-01-27 MED ORDER — BUTALBITAL-APAP-CAFFEINE 50-325-40 MG PO TABS: ORAL_TABLET | ORAL | 1 refills | Status: DC

## 2017-01-27 MED FILL — BUTALB-ACETAMIN-CAFF 50-325: 50-325-40 | 20 days supply | Qty: 40 | Fill #0

## 2017-01-29 ENCOUNTER — Encounter: Payer: Self-pay | Admitting: Family Medicine

## 2017-01-29 ENCOUNTER — Ambulatory Visit: Payer: BC Managed Care – PPO | Attending: Family Medicine | Admitting: Family Medicine

## 2017-01-29 VITALS — BP 124/79 | HR 84 | Temp 98.0°F | Resp 18 | Ht 63.5 in | Wt 191.0 lb

## 2017-01-29 DIAGNOSIS — I10 Essential (primary) hypertension: Secondary | ICD-10-CM | POA: Insufficient documentation

## 2017-01-29 DIAGNOSIS — G4489 Other headache syndrome: Secondary | ICD-10-CM

## 2017-01-29 DIAGNOSIS — I63411 Cerebral infarction due to embolism of right middle cerebral artery: Secondary | ICD-10-CM

## 2017-01-29 DIAGNOSIS — Z7901 Long term (current) use of anticoagulants: Secondary | ICD-10-CM | POA: Diagnosis not present

## 2017-01-29 DIAGNOSIS — Z8673 Personal history of transient ischemic attack (TIA), and cerebral infarction without residual deficits: Secondary | ICD-10-CM | POA: Insufficient documentation

## 2017-01-29 DIAGNOSIS — R519 Headache, unspecified: Secondary | ICD-10-CM | POA: Insufficient documentation

## 2017-01-29 DIAGNOSIS — I482 Chronic atrial fibrillation, unspecified: Secondary | ICD-10-CM

## 2017-01-29 DIAGNOSIS — M79601 Pain in right arm: Secondary | ICD-10-CM | POA: Insufficient documentation

## 2017-01-29 DIAGNOSIS — Z1211 Encounter for screening for malignant neoplasm of colon: Secondary | ICD-10-CM

## 2017-01-29 DIAGNOSIS — R51 Headache: Secondary | ICD-10-CM

## 2017-01-29 MED ORDER — ATORVASTATIN CALCIUM 20 MG PO TABS: ORAL_TABLET | ORAL | 5 refills | Status: DC

## 2017-01-29 MED ORDER — RIVAROXABAN 20 MG PO TABS: 20.0000 mg | ORAL_TABLET | Freq: Every day | ORAL | 5 refills | Status: DC

## 2017-01-29 MED ORDER — TOPIRAMATE 50 MG PO TABS: 50.0000 mg | ORAL_TABLET | Freq: Two times a day (BID) | ORAL | 5 refills | Status: DC

## 2017-01-29 MED ORDER — TIZANIDINE HCL 4 MG PO TABS: 4.0000 mg | ORAL_TABLET | Freq: Three times a day (TID) | ORAL | 1 refills | Status: DC | PRN

## 2017-01-29 MED ORDER — METOPROLOL TARTRATE 25 MG PO TABS: 25.0000 mg | ORAL_TABLET | Freq: Two times a day (BID) | ORAL | 5 refills | Status: DC

## 2017-01-29 MED ORDER — DILTIAZEM HCL ER COATED BEADS 240 MG PO CP24: 240.0000 mg | ORAL_CAPSULE | Freq: Every day | ORAL | 5 refills | Status: DC

## 2017-01-29 MED FILL — tiZANidine HCL 4 MG TABS: 4 | 30 days supply | Qty: 90 | Fill #0

## 2017-01-29 MED FILL — TOPIRAMATE 50 MG TABLET: 50 | 30 days supply | Qty: 60 | Fill #0

## 2017-01-29 NOTE — Patient Instructions (Signed)

## 2017-01-29 NOTE — Progress Notes (Signed)
Patient is here for Right arm pain  Patient denies pain at this time. Patient states arm pain increases at night and feels like pressure inside the arm.  Patient has taken medication today. (no BP med) Patient has eaten today.

## 2017-01-29 NOTE — Progress Notes (Signed)
 Subjective:  Patient ID: [name], female    DOB: 03/10/1966  Age: 50 y.o. MRN: [id number]  CC: Arm Pain (right)   HPI [name] is a 50-year-old female with a history of atrial fibrillation/atrial flutter (status post cardioversion in 02/2016 currently on rate control and anticoagulation), cerebral infarction due to embolism of right middle cerebral artery status post IV TPA (in 10/2015) with no residual deficits who comes into the clinic for a follow-up visit.   She complains of right upper arm pain which gets to a 10 out of 10 at its worst and feels like a balloon stretching. Denies swelling and states that pain is present all the time; denies history of trauma, denies numbness.  She endorses compliance with all her medications and has not seen cardiology lately; she missed her neurology appointment due to her work schedule.  She has intermittent headaches which she has had ever since the stroke; denies nausea, vomiting or blurry vision. Headaches occur every other day but are not disabling and do not affect her ability to function and are not controlled on Fioricet. Denies sinus symptoms.  Denies chest pains, shortness of breath or pedal edema.  Past Medical History:  Diagnosis Date  . Essential hypertension   . Heart murmur    a. 10/2015 Echo: EF 60-65%, no rwma, mild AI/MR, sev dil LA/RA, PASP 35mmHg.  . Noncompliance   . NSVT (nonsustained ventricular tachycardia) ([facility])    a. 10/2015 during admission for CVA/AF.  . Paroxysmal A-fib ([facility])    a. CHA2DS2VASC = 4-->Xarelto;  b. 02/2016 Successful DCCV.  . Pneumonia 02/2011; 08/2011; 03/22/2014  . Stroke ([facility])    a. 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  . Transient ischemic attack (TIA) 01/2013    Past Surgical History:  Procedure Laterality Date  . CARDIOVERSION N/A 02/18/2013   Procedure: CARDIOVERSION;  Surgeon: Mihai Croitoru, MD;   Location: [facility];  Service: Cardiovascular;  Laterality: N/A;  . CARDIOVERSION N/A 02/23/2013   Procedure: CARDIOVERSION;  Surgeon: David W Harding, MD;  Location: [facility] OR;  Service: Cardiovascular;  Laterality: N/A;  BESIDE CV  . CARDIOVERSION N/A 03/12/2016   Procedure: CARDIOVERSION;  Surgeon: Dalton S McLean, MD;  Location: [facility]OS[facility];  Service: Cardiovascular;  Laterality: N/A;  . TEE WITHOUT CARDIOVERSION N/A 02/18/2013   Procedure: TRANSESOPHAGEAL ECHOCARDIOGRAM (TEE);  Surgeon: Mihai Croitoru, MD;  Location: [facility]OS[facility];  Service: Cardiovascular;  Laterality: N/A;  . TUBAL LIGATION  1992    Allergies  Allergen Reactions  . Penicillins Hives and Other (See Comments)    Unknown  Has patient had a PCN reaction causing immediate rash, facial/tongue/throat swelling, SOB or lightheadedness with hypotension: No Has patient had a PCN reaction causing severe rash involving mucus membranes or skin necrosis: No Has patient had a PCN reaction that required hospitalization No Has patient had a PCN reaction occurring within the last 10 years: No If all of the above answers are "NO", then may proceed with Cephalosporin use.    Outpatient Medications Prior to Visit  Medication Sig Dispense Refill  . butalbital-acetaminophen-caffeine (FIORICET, ESGIC) 50-325-40 MG tablet TAKE 1 TABLET BY MOUTH EVERY 12 HOURS AS NEEDED FOR HEADACHE. 40 tablet 1  . traMADol (ULTRAM) 50 MG tablet Take 1 tablet (50 mg total) by mouth every 12 (twelve) hours as needed. 60 tablet 2  . atorvastatin (LIPITOR) 20 MG tablet TAKE 1 TABLET BY MOUTH DAILY AT 6 PM.   30 tablet 3  . metoprolol tartrate (LOPRESSOR) 25 MG tablet Take 1 tablet (25 mg total) by mouth 2 (two) times daily. 180 tablet 3  . rivaroxaban (XARELTO) 20 MG TABS tablet Take 1 tablet (20 mg total) by mouth daily with supper. 90 tablet 3  . diltiazem (CARDIZEM CD) 240 MG 24 hr capsule Take 1 capsule (240 mg total) by mouth daily. 30 capsule 3   No  facility-administered medications prior to visit.     ROS Review of Systems  Constitutional: Negative for activity change, appetite change and fatigue.  HENT: Negative for congestion, sinus pressure and sore throat.   Eyes: Negative for visual disturbance.  Respiratory: Negative for cough, chest tightness, shortness of breath and wheezing.   Cardiovascular: Negative for chest pain and palpitations.  Gastrointestinal: Negative for abdominal distention, abdominal pain and constipation.  Endocrine: Negative for polydipsia.  Genitourinary: Negative for dysuria and frequency.  Musculoskeletal:       See hpi  Skin: Negative for rash.  Neurological: Negative for tremors, light-headedness and numbness.  Hematological: Does not bruise/bleed easily.  Psychiatric/Behavioral: Negative for agitation and behavioral problems.    Objective:  BP 124/79 (BP Location: Left Arm, Patient Position: Sitting, Cuff Size: Normal)   Pulse 84   Temp 98 F (36.7 C) (Oral)   Resp 18   Ht 5' 3.5" (1.613 m)   Wt 191 lb (86.6 kg)   SpO2 98%   BMI 33.30 kg/m   BP/Weight 01/29/2017 09/24/2016 05/20/2016  Systolic BP 124 117 132  Diastolic BP 79 81 89  Wt. (Lbs) 191 191.6 198  BMI 33.3 33.41 35.07      Physical Exam  Constitutional: She is oriented to person, place, and time. She appears well-developed and well-nourished.  Cardiovascular: Normal rate, normal heart sounds and intact distal pulses.  A regularly irregular rhythm present.  No murmur heard. Pulmonary/Chest: Effort normal and breath sounds normal. She has no wheezes. She has no rales. She exhibits no tenderness.  Abdominal: Soft. Bowel sounds are normal. She exhibits no distension and no mass. There is no tenderness.  Musculoskeletal: Normal range of motion.  Full range of motion of right arm, no tenderness to palpation Left upper extremity is normal  Neurological: She is alert and oriented to person, place, and time.     Assessment & Plan:    1. Cerebral infarction due to embolism of right middle cerebral artery (HCC) s/p IV tPA Risk factor modification - atorvastatin (LIPITOR) 20 MG tablet; TAKE 1 TABLET BY MOUTH DAILY AT 6 PM.  Dispense: 30 tablet; Refill: 5  2. Chronic atrial fibrillation (HCC) Status post cardioversion in 02/2016 CHADS2 VASC score of 4 (hypertension, A. fib, stroke) Continue rate control with Cardizem and metoprolol and anticoagulation with Xarelto - diltiazem (CARDIZEM CD) 240 MG 24 hr capsule; Take 1 capsule (240 mg total) by mouth daily.  Dispense: 30 capsule; Refill: 5 - metoprolol tartrate (LOPRESSOR) 25 MG tablet; Take 1 tablet (25 mg total) by mouth 2 (two) times daily.  Dispense: 60 tablet; Refill: 5 - rivaroxaban (XARELTO) 20 MG TABS tablet; Take 1 tablet (20 mg total) by mouth daily with supper.  Dispense: 30 tablet; Refill: 5  3. Screening for colon cancer - Ambulatory referral to Gastroenterology  4. Pain of right upper extremity We'll treat for muscle spasm - tiZANidine (ZANAFLEX) 4 MG tablet; Take 1 tablet (4 mg total) by mouth every 8 (eight) hours as needed for muscle spasms.  Dispense: 90 tablet; Refill: 1    5. Other headache syndrome Post stroke headache Not responding to Fioricet We will place on Topamax-side effects have been discussed - topiramate (TOPAMAX) 50 MG tablet; Take 1 tablet (50 mg total) by mouth 2 (two) times daily.  Dispense: 60 tablet; Refill: 5  6. Essential hypertension Controlled - CMP14+EGFR; Future - Lipid panel; Future   Meds ordered this encounter  Medications  . atorvastatin (LIPITOR) 20 MG tablet    Sig: TAKE 1 TABLET BY MOUTH DAILY AT 6 PM.    Dispense:  30 tablet    Refill:  5  . diltiazem (CARDIZEM CD) 240 MG 24 hr capsule    Sig: Take 1 capsule (240 mg total) by mouth daily.    Dispense:  30 capsule    Refill:  5  . metoprolol tartrate (LOPRESSOR) 25 MG tablet    Sig: Take 1 tablet (25 mg total) by mouth 2 (two) times daily.    Dispense:  60  tablet    Refill:  5  . rivaroxaban (XARELTO) 20 MG TABS tablet    Sig: Take 1 tablet (20 mg total) by mouth daily with supper.    Dispense:  30 tablet    Refill:  5  . topiramate (TOPAMAX) 50 MG tablet    Sig: Take 1 tablet (50 mg total) by mouth 2 (two) times daily.    Dispense:  60 tablet    Refill:  5  . tiZANidine (ZANAFLEX) 4 MG tablet    Sig: Take 1 tablet (4 mg total) by mouth every 8 (eight) hours as needed for muscle spasms.    Dispense:  90 tablet    Refill:  1    Follow-up: Return in about 3 months (around 05/01/2017) for Follow-up of chronic medical conditions.   Enobong Amao MD   

## 2017-02-06 ENCOUNTER — Other Ambulatory Visit: Payer: BC Managed Care – PPO

## 2017-02-25 ENCOUNTER — Other Ambulatory Visit: Payer: Self-pay | Admitting: Family Medicine

## 2017-02-25 DIAGNOSIS — I482 Chronic atrial fibrillation, unspecified: Secondary | ICD-10-CM

## 2017-02-25 DIAGNOSIS — G4489 Other headache syndrome: Secondary | ICD-10-CM

## 2017-02-25 DIAGNOSIS — I63411 Cerebral infarction due to embolism of right middle cerebral artery: Secondary | ICD-10-CM

## 2017-02-25 MED FILL — BUTALBITAL/APAP/CAFFEINE TA: 50-325-40 | 20 days supply | Qty: 40 | Fill #1

## 2017-02-25 MED FILL — traMADol HCL 50 MG TABS: 50 | 30 days supply | Qty: 60 | Fill #1

## 2017-02-25 MED FILL — XARELTO 20 MG TABLET: 20 | 30 days supply | Qty: 30 | Fill #4

## 2017-02-25 MED FILL — METOPROLOL TARTRATE 25 MG T: 25 | 30 days supply | Qty: 60 | Fill #4

## 2017-03-03 ENCOUNTER — Ambulatory Visit: Payer: BC Managed Care – PPO | Attending: Family Medicine

## 2017-03-03 DIAGNOSIS — I10 Essential (primary) hypertension: Secondary | ICD-10-CM

## 2017-03-03 NOTE — Progress Notes (Signed)
Patient here for lab visit  

## 2017-03-04 LAB — CMP14+EGFR
ALT: 28 IU/L (ref 0–32)
AST: 32 IU/L (ref 0–40)
Albumin/Globulin Ratio: 1.2 (ref 1.2–2.2)
Albumin: 4.1 g/dL (ref 3.5–5.5)
Alkaline Phosphatase: 126 IU/L — ABNORMAL HIGH (ref 39–117)
BILIRUBIN TOTAL: 0.4 mg/dL (ref 0.0–1.2)
BUN/Creatinine Ratio: 13 (ref 9–23)
BUN: 11 mg/dL (ref 6–24)
CHLORIDE: 104 mmol/L (ref 96–106)
CO2: 23 mmol/L (ref 20–29)
Calcium: 9.3 mg/dL (ref 8.7–10.2)
Creatinine, Ser: 0.83 mg/dL (ref 0.57–1.00)
GFR calc Af Amer: 94 mL/min/{1.73_m2} (ref >59)
GFR calc non Af Amer: 81 mL/min/{1.73_m2} (ref >59)
GLUCOSE: 110 mg/dL — AB (ref 65–99)
Globulin, Total: 3.4 g/dL (ref 1.5–4.5)
POTASSIUM: 3.9 mmol/L (ref 3.5–5.2)
Sodium: 143 mmol/L (ref 134–144)
Total Protein: 7.5 g/dL (ref 6.0–8.5)

## 2017-03-04 LAB — LIPID PANEL
CHOLESTEROL TOTAL: 138 mg/dL (ref 100–199)
Chol/HDL Ratio: 2.3 ratio (ref 0.0–4.4)
HDL: 59 mg/dL (ref >39)
LDL Calculated: 63 mg/dL (ref 0–99)
TRIGLYCERIDES: 78 mg/dL (ref 0–149)
VLDL CHOLESTEROL CAL: 16 mg/dL (ref 5–40)

## 2017-03-09 ENCOUNTER — Encounter: Payer: Self-pay | Admitting: Physician Assistant

## 2017-03-09 ENCOUNTER — Ambulatory Visit: Payer: BC Managed Care – PPO | Admitting: Physician Assistant

## 2017-03-09 NOTE — Progress Notes (Deleted)
Cardiology Office Note   Date:  03/09/2017   ID:  [name], [name] 14-Dec-1965, MRN [id number]  PCP:  Arnoldo Morale, MD  Cardiologist:  Dr Sallyanne Kuster, in-hospital 10/2015 Bernetta Sutley, Suanne Marker, PA-C   No chief complaint on file.   History of Present Illness: [name] is a 51 y.o. female with a history of HTN, recurrent persistent atrial fibrillation and atrial flutter (DCCV 02/2016), hx of non compliance, COPD,  TIA and CVA  [name] presents for ***   Past Medical History:  Diagnosis Date  . Essential hypertension   . Heart murmur    a. 10/2015 Echo: EF 60-65%, no rwma, mild AI/MR, sev dil LA/RA, PASP 62mmHg.  [name] Noncompliance   . NSVT (nonsustained ventricular tachycardia) ([place])    a. 10/2015 during admission for CVA/AF.  [name] Paroxysmal A-fib ([place])    a. CHA2DS2VASC = 4-->Xarelto;  b. 02/2016 Successful DCCV.  [name] Pneumonia 2012 x 2; 2015  . Stroke [facility])    a. 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  . Transient ischemic attack (TIA) 01/2013    Past Surgical History:  Procedure Laterality Date  . CARDIOVERSION N/A 02/18/2013   Procedure: CARDIOVERSION;  Surgeon: Sanda Klein, MD;  Location: [place] ENDOSCOPY;  Service: Cardiovascular;  Laterality: N/A;  . CARDIOVERSION N/A 02/23/2013   Procedure: CARDIOVERSION;  Surgeon: Leonie Man, MD;  Location: [place];  Service: Cardiovascular;  Laterality: N/A;  BESIDE CV  . CARDIOVERSION N/A 03/12/2016   Procedure: CARDIOVERSION;  Surgeon: Larey Dresser, MD;  Location: [place];  Service: Cardiovascular;  Laterality: N/A;  . TEE WITHOUT CARDIOVERSION N/A 02/18/2013   Procedure: TRANSESOPHAGEAL ECHOCARDIOGRAM (TEE);  Surgeon: Sanda Klein, MD;  Location: [facility] ENDOSCOPY;  Service: Cardiovascular;  Laterality: N/A;  . TUBAL LIGATION  1992    Current Outpatient Prescriptions  Medication Sig Dispense Refill  . atorvastatin (LIPITOR) 20 MG tablet TAKE  1 TABLET BY MOUTH DAILY AT 6 PM. 30 tablet 5  . butalbital-acetaminophen-caffeine (FIORICET, ESGIC) 50-325-40 MG tablet TAKE 1 TABLET BY MOUTH EVERY 12 HOURS AS NEEDED FOR HEADACHE. 40 tablet 1  . diltiazem (CARDIZEM CD) 240 MG 24 hr capsule Take 1 capsule (240 mg total) by mouth daily. 30 capsule 5  . metoprolol tartrate (LOPRESSOR) 25 MG tablet Take 1 tablet (25 mg total) by mouth 2 (two) times daily. 60 tablet 5  . rivaroxaban (XARELTO) 20 MG TABS tablet Take 1 tablet (20 mg total) by mouth daily with supper. 30 tablet 5  . tiZANidine (ZANAFLEX) 4 MG tablet Take 1 tablet (4 mg total) by mouth every 8 (eight) hours as needed for muscle spasms. 90 tablet 1  . topiramate (TOPAMAX) 50 MG tablet Take 1 tablet (50 mg total) by mouth 2 (two) times daily. 60 tablet 5  . traMADol (ULTRAM) 50 MG tablet Take 1 tablet (50 mg total) by mouth every 12 (twelve) hours as needed. 60 tablet 2   No current facility-administered medications for this visit.     Allergies:   Penicillins    Social History:  The patient  reports that she quit smoking about 3 years ago. Her smoking use included Cigars. She quit after 3.00 years of use. She has never used smokeless tobacco. She reports that she does not drink alcohol or use drugs.   Family History:  The patient's family history includes Atrial fibrillation in her father and mother; Cancer in her mother; Hypertension in  her father; Peripheral vascular disease in her father.    ROS:  Please see the history of present illness. All other systems are reviewed and negative.    PHYSICAL EXAM: VS:  There were no vitals taken for this visit. , BMI There is no height or weight on file to calculate BMI. GEN: Well nourished, well developed, female in no acute distress  HEENT: normal for age  Neck: no JVD, no carotid bruit, no masses Cardiac: RRR; no murmur, no rubs, or gallops Respiratory:  clear to auscultation bilaterally, normal work of breathing GI: soft, nontender,  nondistended, + BS MS: no deformity or atrophy; no edema; distal pulses are 2+ in all 4 extremities   Skin: warm and dry, no rash Neuro:  Strength and sensation are intact Psych: euthymic mood, full affect   EKG:  EKG {ACTION; IS/IS [id number]} ordered today. The ekg ordered today demonstrates ***   Recent Labs: 03/03/2017: ALT 28; BUN 11; Creatinine, Ser 0.83; Potassium 3.9; Sodium 143    Lipid Panel    Component Value Date/Time   CHOL 138 03/03/2017 0848   TRIG 78 03/03/2017 0848   HDL 59 03/03/2017 0848   CHOLHDL 2.3 03/03/2017 0848   CHOLHDL 4.1 11/03/2015 0405   VLDL 10 11/03/2015 0405   LDLCALC 63 03/03/2017 0848     Wt Readings from Last 3 Encounters:  01/29/17 191 lb (86.6 kg)  09/24/16 191 lb 9.6 oz (86.9 kg)  05/20/16 198 lb (89.8 kg)     Other studies Reviewed: Additional studies/ records that were reviewed today include: ***.  ASSESSMENT AND PLAN:  1.  ***   Current medicines are reviewed at length with the patient today.  The patient {ACTIONS; HAS/DOES NOT HAVE:19233} concerns regarding medicines.  The following changes have been made:  {PLAN; NO CHANGE:13088:s}  Labs/ tests ordered today include: *** No orders of the defined types were placed in this encounter.    Disposition:   FU with ***  Signed, Lenoard Aden  03/09/2017 7:47 AM    [facility] Phone: [phone]; Fax: [phone]  This note was written with the assistance of speech recognition software. Please excuse any transcriptional errors.

## 2017-03-10 ENCOUNTER — Other Ambulatory Visit: Payer: Self-pay | Admitting: Family Medicine

## 2017-03-10 DIAGNOSIS — Z1231 Encounter for screening mammogram for malignant neoplasm of breast: Secondary | ICD-10-CM

## 2017-03-16 ENCOUNTER — Ambulatory Visit (INDEPENDENT_AMBULATORY_CARE_PROVIDER_SITE_OTHER): Payer: BC Managed Care – PPO | Admitting: Physician Assistant

## 2017-03-16 ENCOUNTER — Encounter: Payer: Self-pay | Admitting: Physician Assistant

## 2017-03-16 VITALS — BP 126/64 | HR 96 | Ht 63.5 in | Wt 188.0 lb

## 2017-03-16 DIAGNOSIS — I482 Chronic atrial fibrillation, unspecified: Secondary | ICD-10-CM

## 2017-03-16 DIAGNOSIS — Z7901 Long term (current) use of anticoagulants: Secondary | ICD-10-CM | POA: Diagnosis not present

## 2017-03-16 MED ORDER — DILTIAZEM HCL ER COATED BEADS 300 MG PO CP24: 300.0000 mg | ORAL_CAPSULE | Freq: Every day | ORAL | 5 refills | Status: DC

## 2017-03-16 MED FILL — CARTIA XT 300 MG CAPSULE SA: 300 | 30 days supply | Qty: 30 | Fill #0

## 2017-03-16 NOTE — Progress Notes (Signed)
Cardiology Office Note   Date:  03/16/2017   ID:  [name], [name] 14-Feb-1966, MRN [id number]  PCP:  Arnoldo Morale, MD  Cardiologist:  Dr Sallyanne Kuster 11/02/2015 [place], [place] 03/07/2016 Evalynne Locurto, Suanne Marker, PA-C   Chief Complaint  Patient presents with  . Follow-up    History of Present Illness: [name] is a 51 y.o. 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   [name] presents for cardiology follow up.  She feels she is doing pretty well.  She does not get light-headed. She gets HAs, but has had them ever since she had the 2nd CVA. 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   She feels palpitations once in a while when her HR is up. No DOE. She gets tired at times but feels it is normal for her level of exertion. She works quite a bit, so does not exercise. She gets SOB going up stairs. No chest pain.   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 She is compliant with her Xarelto. She drinks some on the weekends, not during the week. No bleeding issues.  She feels good about things. She is post-menopausal, no periods.   Past Medical History:  Diagnosis Date  . Essential hypertension   . Heart murmur    a. 10/2015 Echo: EF 60-65%, no rwma, mild AI/MR, sev dil LA/RA, PASP 61mmHg.  [name] Noncompliance   . NSVT (nonsustained ventricular tachycardia) ([place])    a. 10/2015 during admission for CVA/AF.  [name] Paroxysmal A-fib ([place])    a. CHA2DS2VASC = 4-->Xarelto;  b. 02/2016 Successful DCCV.  [name] Pneumonia 2012 x 2; 2015  . Stroke [facility])    a. 02/736 Embolic CVA of mid right middle cerebral atery - recieved TPA-->small amt of asymptomatic hemorrhagic transformation.  . Transient ischemic attack (TIA) 01/2013    Past Surgical History:  Procedure Laterality Date    . CARDIOVERSION N/A 02/18/2013   Procedure: CARDIOVERSION;  Surgeon: Sanda Klein, MD;  Location: [facility] ENDOSCOPY;  Service: Cardiovascular;  Laterality: N/A;  . CARDIOVERSION N/A 02/23/2013   Procedure: CARDIOVERSION;  Surgeon: Leonie Man, MD;  Location: [place];  Service: Cardiovascular;  Laterality: N/A;  BESIDE CV  . CARDIOVERSION N/A 03/12/2016   Procedure: CARDIOVERSION;  Surgeon: Larey Dresser, MD;  Location: [place];  Service: Cardiovascular;  Laterality: N/A;  . TEE WITHOUT CARDIOVERSION N/A 02/18/2013   Procedure: TRANSESOPHAGEAL ECHOCARDIOGRAM (TEE);  Surgeon: Sanda Klein, MD;  Location: [facility] ENDOSCOPY;  Service: Cardiovascular;  Laterality: N/A;  . TUBAL LIGATION  1992    Current Outpatient Prescriptions  Medication Sig Dispense Refill  . atorvastatin (LIPITOR) 20 MG tablet TAKE 1 TABLET BY MOUTH DAILY AT 6 PM. 30 tablet 5  . butalbital-acetaminophen-caffeine (FIORICET, ESGIC) 50-325-40 MG tablet TAKE 1 TABLET BY MOUTH EVERY 12 HOURS AS NEEDED FOR HEADACHE. 40 tablet 1  . diltiazem (CARDIZEM CD) 240 MG 24 hr capsule Take 1 capsule (240 mg total) by mouth daily. 30 capsule 5  . metoprolol tartrate (LOPRESSOR) 25 MG tablet Take 1 tablet (25 mg total) by mouth 2 (two) times daily. 60 tablet 5  . rivaroxaban (XARELTO) 20 MG TABS tablet Take 1 tablet (20 mg total) by mouth daily with supper. 30 tablet 5  . tiZANidine (  ZANAFLEX) 4 MG tablet Take 1 tablet (4 mg total) by mouth every 8 (eight) hours as needed for muscle spasms. 90 tablet 1  . topiramate (TOPAMAX) 50 MG tablet Take 1 tablet (50 mg total) by mouth 2 (two) times daily. 60 tablet 5  . traMADol (ULTRAM) 50 MG tablet Take 1 tablet (50 mg total) by mouth every 12 (twelve) hours as needed. 60 tablet 2   No current facility-administered medications for this visit.     Allergies:   Penicillins    Social History:  The patient  reports that she quit smoking about 4 years ago. Her smoking use included Cigarettes. She quit  after 3.00 years of use. She has never used smokeless tobacco. She reports that she drinks alcohol. She reports that she does not use drugs.   Family History:  The patient's family history includes Atrial fibrillation in her father and mother; Cancer in her mother; Hypertension in her father; Peripheral vascular disease in her father.    ROS:  Please see the history of present illness. All other systems are reviewed and negative.    PHYSICAL EXAM: VS:  BP 126/64   Pulse 96   Ht 5' 3.5" (1.613 m)   Wt 188 lb (85.3 kg)   BMI 32.78 kg/m  , BMI Body mass index is 32.78 kg/m. GEN: Well nourished, well developed, female in no acute distress  HEENT: normal for age  Neck: no JVD, no carotid bruit, no masses Cardiac: RRR; no sig murmur, no rubs, or gallops Respiratory:  clear to auscultation bilaterally, normal work of breathing GI: soft, nontender, nondistended, + BS MS: no deformity or atrophy; no edema; distal pulses are 2+ in all 4 extremities   Skin: warm and dry, no rash Neuro:  Strength and sensation are intact Psych: euthymic mood, full affect   EKG:  EKG is ordered today. The ekg ordered today demonstrates atrial fib, HR 96.  ECHO: 11/03/2015 - Left ventricle: The cavity size was normal. Wall thickness was   increased in a pattern of moderate LVH. Systolic function was   normal. The estimated ejection fraction was in the range of 60%   to 65%. Wall motion was normal; there were no regional wall   motion abnormalities. - Aortic valve: There was mild regurgitation. Valve area (VTI):   2.33 cm^2. Valve area (Vmax): 2.45 cm^2. - Mitral valve: There was mild regurgitation. - Left atrium: The atrium was severely dilated. - Right atrium: The atrium was severely dilated. - Atrial septum: No defect or patent foramen ovale was identified. - Pulmonary arteries: Systolic pressure was mildly increased. PA   peak pressure: 35 mm Hg (S). - Technically adequate study.   Recent  Labs: 03/03/2017: ALT 28; BUN 11; Creatinine, Ser 0.83; Potassium 3.9; Sodium 143    Lipid Panel    Component Value Date/Time   CHOL 138 03/03/2017 0848   TRIG 78 03/03/2017 0848   HDL 59 03/03/2017 0848   CHOLHDL 2.3 03/03/2017 0848   CHOLHDL 4.1 11/03/2015 0405   VLDL 10 11/03/2015 0405   LDLCALC 63 03/03/2017 0848     Wt Readings from Last 3 Encounters:  03/16/17 188 lb (85.3 kg)  01/29/17 191 lb (86.6 kg)  09/24/16 191 lb 9.6 oz (86.9 kg)     Other studies Reviewed: Additional studies/ records that were reviewed today include: office notes, hospital records and testing.  ASSESSMENT AND PLAN:  1.  Persistent, probably chronic atrial fib: pt feels she may have been in  it a long time. Severe biatrial dilatation seen on echo last year so do not think anti-arrhythmic therapy indicated. Can consider EP referral for ablation, but with CVA hx, not sure that would get her off anticoagulation. If it would not get her off anticoagulation, she is not interested. No sx CHF, will not repeat echo at this time.  Resting HR 93 and she is working a lot. Increase Cardizem to 300 mg qd.  She is due for a colonoscopy, with her hx CVA, will discuss with MD if X-cover w/ lovenox needed. She may also be a candidate for a Watchman device, this would give her the potential to come off anticoag. Message sent to Dr Burt Knack, pt is interested in procedure if she is a candidate.  2. Chronic anticoagulation: pt is currently able to afford Xarelto and says she has not missed doses.   Current medicines are reviewed at length with the patient today.  The patient does not have concerns regarding medicines.  The following changes have been made:  Increase Cardizem.  Labs/ tests ordered today include:  No orders of the defined types were placed in this encounter.    Disposition:   FU with Dr Sallyanne Kuster  Signed, Rosaria Ferries, PA-C  03/16/2017 2:10 PM    [place] Phone: [phone]; Fax: [phone]  This note was written with the assistance of speech recognition software. Please excuse any transcriptional errors.

## 2017-03-16 NOTE — Patient Instructions (Signed)
Medication Instructions:  INCREASE Cardizem to 300mg  Take 1 tablet by mouth once a day  Labwork: None   Testing/Procedures: None   Follow-Up: Your physician recommends that you schedule a follow-up appointment in: 3 MONTH with DR CROITORU  Any Other Special Instructions Will Be Listed Below (If Applicable).   If you need a refill on your cardiac medications before your next appointment, please call your pharmacy.

## 2017-03-30 ENCOUNTER — Other Ambulatory Visit: Payer: Self-pay | Admitting: Family Medicine

## 2017-03-30 DIAGNOSIS — M79601 Pain in right arm: Secondary | ICD-10-CM

## 2017-03-30 DIAGNOSIS — D165 Benign neoplasm of lower jaw bone: Secondary | ICD-10-CM

## 2017-03-30 DIAGNOSIS — D164 Benign neoplasm of bones of skull and face: Secondary | ICD-10-CM

## 2017-03-30 DIAGNOSIS — I63411 Cerebral infarction due to embolism of right middle cerebral artery: Secondary | ICD-10-CM

## 2017-03-30 MED FILL — BUTALBITAL/APAP/CAFFEINE TA: 50-325-40 | 20 days supply | Qty: 40 | Fill #0

## 2017-03-30 MED FILL — traMADol HCL 50 MG TABS: 50 | 30 days supply | Qty: 60 | Fill #2

## 2017-03-30 MED FILL — METOPROLOL TARTRATE 25 MG T: 25 | 30 days supply | Qty: 60 | Fill #5

## 2017-03-30 MED FILL — ATORVASTATIN 20 MG TABLET: 20 | 30 days supply | Qty: 30 | Fill #0

## 2017-03-30 MED FILL — XARELTO 20 MG TABLET: 20 | 30 days supply | Qty: 30 | Fill #5

## 2017-03-30 MED FILL — TOPIRAMATE 50 MG TABLET: 50 | 30 days supply | Qty: 60 | Fill #1

## 2017-04-03 MED FILL — tiZANidine HCL 4 MG TABS: 4 | 30 days supply | Qty: 90 | Fill #0

## 2017-04-08 ENCOUNTER — Ambulatory Visit: Payer: BC Managed Care – PPO

## 2017-04-08 ENCOUNTER — Ambulatory Visit
Admission: RE | Admit: 2017-04-08 | Discharge: 2017-04-08 | Disposition: A | Discharge status: Home / Self Care | Payer: BC Managed Care – PPO | Source: Ambulatory Visit | Attending: Family Medicine | Admitting: Family Medicine

## 2017-04-08 DIAGNOSIS — Z1231 Encounter for screening mammogram for malignant neoplasm of breast: Secondary | ICD-10-CM

## 2017-04-13 ENCOUNTER — Other Ambulatory Visit: Payer: BC Managed Care – PPO | Admitting: Family Medicine

## 2017-04-14 MED FILL — CARTIA XT 300 MG CAPSULE SA: 300 | 30 days supply | Qty: 30 | Fill #1

## 2017-04-22 ENCOUNTER — Other Ambulatory Visit (HOSPITAL_COMMUNITY)
Admission: RE | Admit: 2017-04-22 | Discharge: 2017-04-22 | Disposition: A | Discharge status: Home / Self Care | Payer: BC Managed Care – PPO | Source: Ambulatory Visit | Attending: Family Medicine | Admitting: Family Medicine

## 2017-04-22 ENCOUNTER — Ambulatory Visit: Payer: BC Managed Care – PPO | Attending: Family Medicine | Admitting: Family Medicine

## 2017-04-22 ENCOUNTER — Encounter: Payer: Self-pay | Admitting: Gastroenterology

## 2017-04-22 ENCOUNTER — Encounter: Payer: Self-pay | Admitting: Family Medicine

## 2017-04-22 VITALS — BP 129/80 | HR 83 | Temp 97.9°F | Ht 63.0 in | Wt 193.4 lb

## 2017-04-22 DIAGNOSIS — Z9889 Other specified postprocedural states: Secondary | ICD-10-CM | POA: Insufficient documentation

## 2017-04-22 DIAGNOSIS — R011 Cardiac murmur, unspecified: Secondary | ICD-10-CM | POA: Insufficient documentation

## 2017-04-22 DIAGNOSIS — I480 Paroxysmal atrial fibrillation: Secondary | ICD-10-CM | POA: Insufficient documentation

## 2017-04-22 DIAGNOSIS — Z1211 Encounter for screening for malignant neoplasm of colon: Secondary | ICD-10-CM

## 2017-04-22 DIAGNOSIS — Z9851 Tubal ligation status: Secondary | ICD-10-CM | POA: Diagnosis not present

## 2017-04-22 DIAGNOSIS — Z7902 Long term (current) use of antithrombotics/antiplatelets: Secondary | ICD-10-CM | POA: Diagnosis not present

## 2017-04-22 DIAGNOSIS — Z8701 Personal history of pneumonia (recurrent): Secondary | ICD-10-CM | POA: Insufficient documentation

## 2017-04-22 DIAGNOSIS — Z8673 Personal history of transient ischemic attack (TIA), and cerebral infarction without residual deficits: Secondary | ICD-10-CM | POA: Diagnosis not present

## 2017-04-22 DIAGNOSIS — Z114 Encounter for screening for human immunodeficiency virus [HIV]: Secondary | ICD-10-CM | POA: Insufficient documentation

## 2017-04-22 DIAGNOSIS — I4892 Unspecified atrial flutter: Secondary | ICD-10-CM | POA: Insufficient documentation

## 2017-04-22 DIAGNOSIS — Z1159 Encounter for screening for other viral diseases: Secondary | ICD-10-CM | POA: Insufficient documentation

## 2017-04-22 DIAGNOSIS — I472 Ventricular tachycardia: Secondary | ICD-10-CM | POA: Insufficient documentation

## 2017-04-22 DIAGNOSIS — I10 Essential (primary) hypertension: Secondary | ICD-10-CM | POA: Insufficient documentation

## 2017-04-22 DIAGNOSIS — Z124 Encounter for screening for malignant neoplasm of cervix: Secondary | ICD-10-CM | POA: Diagnosis not present

## 2017-04-22 DIAGNOSIS — Z880 Allergy status to penicillin: Secondary | ICD-10-CM | POA: Insufficient documentation

## 2017-04-22 NOTE — Patient Instructions (Signed)

## 2017-04-22 NOTE — Progress Notes (Signed)
Subjective:  Patient ID: [name], female    DOB: March 10, 1966  Age: 51 y.o. MRN: [id number]  CC: Gynecologic Exam   HPI [name] is a 51 year old female with a history of atrial fibrillation/atrial flutter (status post cardioversion in 02/2016 currently on rate control and anticoagulation), cerebral infarction due to embolism of right middle cerebral artery status post IV TPA (in 10/2015) with no residual deficitsWho presents today for a Pap smear.  She had a normal screening mammogram last month. Note up-to-date on her colonoscopy.  Past Medical History:  Diagnosis Date  . Essential hypertension   . Heart murmur    a. 10/2015 Echo: EF 60-65%, no rwma, mild AI/MR, sev dil LA/RA, PASP 28mmHg.  [name] Noncompliance   . NSVT (nonsustained ventricular tachycardia) ([place])    a. 10/2015 during admission for CVA/AF.  [name] Paroxysmal A-fib ([place])    a. CHA2DS2VASC = 4-->Xarelto;  b. 02/2016 Successful DCCV.  [name] Pneumonia 2012 x 2; 2015  . Stroke [facility])    a. 01/276 Embolic CVA of mid right middle cerebral atery - recieved TPA-->small amt of asymptomatic hemorrhagic transformation.  . Transient ischemic attack (TIA) 01/2013    Past Surgical History:  Procedure Laterality Date  . CARDIOVERSION N/A 02/18/2013   Procedure: CARDIOVERSION;  Surgeon: Sanda Klein, MD;  Location: [place] ENDOSCOPY;  Service: Cardiovascular;  Laterality: N/A;  . CARDIOVERSION N/A 02/23/2013   Procedure: CARDIOVERSION;  Surgeon: Leonie Man, MD;  Location: [place];  Service: Cardiovascular;  Laterality: N/A;  BESIDE CV  . CARDIOVERSION N/A 03/12/2016   Procedure: CARDIOVERSION;  Surgeon: Larey Dresser, MD;  Location: [place];  Service: Cardiovascular;  Laterality: N/A;  . TEE WITHOUT CARDIOVERSION N/A 02/18/2013   Procedure: TRANSESOPHAGEAL ECHOCARDIOGRAM (TEE);  Surgeon: Sanda Klein, MD;  Location: [facility] ENDOSCOPY;  Service: Cardiovascular;  Laterality: N/A;  . TUBAL LIGATION  1992    Allergies  Allergen Reactions    . Penicillins Hives and Other (See Comments)    Unknown  Has patient had a PCN reaction causing immediate rash, facial/tongue/throat swelling, SOB or lightheadedness with hypotension: No Has patient had a PCN reaction causing severe rash involving mucus membranes or skin necrosis: No Has patient had a PCN reaction that required hospitalization No Has patient had a PCN reaction occurring within the last 10 years: No If all of the above answers are "NO", then may proceed with Cephalosporin use.      Outpatient Medications Prior to Visit  Medication Sig Dispense Refill  . atorvastatin (LIPITOR) 20 MG tablet TAKE 1 TABLET BY MOUTH DAILY AT 6 PM. 30 tablet 5  . butalbital-acetaminophen-caffeine (FIORICET, ESGIC) 50-325-40 MG tablet TAKE 1 TABLET BY MOUTH EVERY 12 HOURS AS NEEDED FOR HEADACHE. 40 tablet 1  . metoprolol tartrate (LOPRESSOR) 25 MG tablet Take 1 tablet (25 mg total) by mouth 2 (two) times daily. 60 tablet 5  . rivaroxaban (XARELTO) 20 MG TABS tablet Take 1 tablet (20 mg total) by mouth daily with supper. 30 tablet 5  . traMADol (ULTRAM) 50 MG tablet TAKE 1 TABLET BY MOUTH EVERY 12 HOURS AS NEEDED 60 tablet 2  . diltiazem (CARDIZEM CD) 300 MG 24 hr capsule Take 1 capsule (300 mg total) by mouth daily. 30 capsule 5  . tiZANidine (ZANAFLEX) 4 MG tablet TAKE 1 TABLET BY MOUTH EVERY 8 HOURS AS NEEDED FOR MUSCLE SPASMS. (Patient not taking: Reported on 04/22/2017) 90 tablet 1  . topiramate (TOPAMAX) 50 MG tablet Take 1 tablet (50 mg total)  by mouth 2 (two) times daily. (Patient not taking: Reported on 04/22/2017) 60 tablet 5   No facility-administered medications prior to visit.     ROS Review of Systems  Constitutional: Negative for activity change, appetite change and fatigue.  HENT: Negative for congestion, sinus pressure and sore throat.   Eyes: Negative for visual disturbance.  Respiratory: Negative for cough, chest tightness, shortness of breath and wheezing.   Cardiovascular:  Negative for chest pain and palpitations.  Gastrointestinal: Negative for abdominal distention, abdominal pain and constipation.  Endocrine: Negative for polydipsia.  Genitourinary: Negative for dysuria and frequency.  Musculoskeletal: Negative for arthralgias and back pain.  Skin: Negative for rash.  Neurological: Negative for tremors, light-headedness and numbness.  Hematological: Does not bruise/bleed easily.  Psychiatric/Behavioral: Negative for agitation and behavioral problems.    Objective:  BP 129/80   Pulse 83   Temp 97.9 F (36.6 C) (Oral)   Ht 5\' 3"  (1.6 m)   Wt 193 lb 6.4 oz (87.7 kg)   SpO2 100%   BMI 34.26 kg/m   BP/Weight 04/22/2017 03/16/2017 2/80/0349  Systolic BP 179 150 569  Diastolic BP 80 64 79  Wt. (Lbs) 193.4 188 191  BMI 34.26 32.78 33.3     Physical Exam  Constitutional: She is oriented to person, place, and time. She appears well-developed and well-nourished.  Cardiovascular: Normal rate, normal heart sounds and intact distal pulses.   No murmur heard. Pulmonary/Chest: Effort normal and breath sounds normal. She has no wheezes. She has no rales. She exhibits no tenderness.  Abdominal: Soft. Bowel sounds are normal. She exhibits no distension and no mass. There is no tenderness.  Genitourinary:  Genitourinary Comments: External genitalia, cervix, vagina, adnexa all normal  Musculoskeletal: Normal range of motion.  Neurological: She is alert and oriented to person, place, and time.     Assessment & Plan:   1. Screening for cervical cancer - Cytology - PAP [place]  2. Screening for colon cancer - Ambulatory referral to Gastroenterology  3. Screening for viral diseases -HIV  No orders of the defined types were placed in this encounter.   Follow-up: Return in about 3 months (around 07/23/2017) for Follow-up on chronic medical conditions.   This note has been created with Surveyor, quantity. Any  transcriptional errors are unintentional.     Arnoldo Morale MD

## 2017-04-23 LAB — HIV ANTIBODY (ROUTINE TESTING W REFLEX): HIV Screen 4th Generation wRfx: NONREACTIVE

## 2017-04-27 LAB — CYTOLOGY - PAP
Adequacy: ABSENT
CHLAMYDIA, DNA PROBE: NEGATIVE
DIAGNOSIS: NEGATIVE
HPV (WINDOPATH): NOT DETECTED
Neisseria Gonorrhea: NEGATIVE

## 2017-05-01 ENCOUNTER — Ambulatory Visit: Payer: BC Managed Care – PPO | Admitting: Family Medicine

## 2017-05-06 MED FILL — METOPROLOL TARTRATE 25 MG T: 25 | 30 days supply | Qty: 60 | Fill #6

## 2017-05-06 MED FILL — XARELTO 20 MG TABLET: 20 | 30 days supply | Qty: 30 | Fill #6

## 2017-05-06 MED FILL — BUTALB-ACETAMIN-CAFF 50-325: 50-325-40 | 20 days supply | Qty: 40 | Fill #1

## 2017-05-06 MED FILL — ATORVASTATIN 20 MG TABLET: 20 | 30 days supply | Qty: 30 | Fill #1

## 2017-05-06 MED FILL — TOPIRAMATE 50 MG TABLET: 50 | 30 days supply | Qty: 60 | Fill #2

## 2017-05-07 MED FILL — tiZANidine HCL 4 MG TABS: 4 | 30 days supply | Qty: 90 | Fill #1

## 2017-05-12 NOTE — Progress Notes (Signed)
Thanks for the heads up, [employer]

## 2017-05-13 NOTE — Progress Notes (Signed)
No, just thanking you for the info about the [place] program.

## 2017-05-25 MED FILL — CARTIA XT 300 MG CAPSULE SA: 300 | 30 days supply | Qty: 30 | Fill #2

## 2017-06-10 ENCOUNTER — Other Ambulatory Visit: Payer: Self-pay | Admitting: Family Medicine

## 2017-06-10 DIAGNOSIS — D164 Benign neoplasm of bones of skull and face: Secondary | ICD-10-CM

## 2017-06-10 DIAGNOSIS — G4489 Other headache syndrome: Secondary | ICD-10-CM

## 2017-06-10 DIAGNOSIS — D165 Benign neoplasm of lower jaw bone: Secondary | ICD-10-CM

## 2017-06-10 MED FILL — METOPROLOL TARTRATE 25 MG T: 25 | 30 days supply | Qty: 60 | Fill #7

## 2017-06-10 MED FILL — ATORVASTATIN 20 MG TABLET: 20 | 30 days supply | Qty: 30 | Fill #2

## 2017-06-10 MED FILL — XARELTO 20 MG TABLET: 20 | 30 days supply | Qty: 30 | Fill #7

## 2017-06-18 ENCOUNTER — Ambulatory Visit: Payer: BC Managed Care – PPO | Admitting: Gastroenterology

## 2017-06-23 ENCOUNTER — Ambulatory Visit: Payer: BC Managed Care – PPO | Admitting: Cardiovascular Disease

## 2017-06-24 ENCOUNTER — Encounter: Payer: Self-pay | Admitting: *Deleted

## 2017-07-17 ENCOUNTER — Telehealth: Payer: Self-pay | Admitting: Family Medicine

## 2017-07-17 MED FILL — XARELTO 20 MG TABLET: 20 | 30 days supply | Qty: 30 | Fill #8

## 2017-07-17 MED FILL — METOPROLOL TARTRATE 25 MG T: 25 | 30 days supply | Qty: 60 | Fill #8

## 2017-07-17 MED FILL — CARTIA XT 300 MG CAPSULE SA: 300 | 30 days supply | Qty: 30 | Fill #3

## 2017-07-17 NOTE — Telephone Encounter (Signed)
Pt called to request a refill for  traMADol (ULTRAM) 50 MG tablet   please follow up

## 2017-07-20 NOTE — Telephone Encounter (Signed)
Pt was called and a message was left informing pt to return phone call.

## 2017-07-20 NOTE — Telephone Encounter (Signed)
Pt called back, please f/up  °

## 2017-07-21 NOTE — Telephone Encounter (Signed)
Pt was called and a VM was left informing pt to return phone cal if she still has the script from 7/17.

## 2017-08-10 ENCOUNTER — Ambulatory Visit: Payer: BC Managed Care – PPO | Attending: Family Medicine | Admitting: Family Medicine

## 2017-08-10 ENCOUNTER — Encounter: Payer: Self-pay | Admitting: Family Medicine

## 2017-08-10 VITALS — BP 128/93 | HR 125 | Temp 98.0°F | Ht 63.0 in | Wt 192.4 lb

## 2017-08-10 DIAGNOSIS — I480 Paroxysmal atrial fibrillation: Secondary | ICD-10-CM | POA: Insufficient documentation

## 2017-08-10 DIAGNOSIS — I10 Essential (primary) hypertension: Secondary | ICD-10-CM | POA: Diagnosis present

## 2017-08-10 DIAGNOSIS — I482 Chronic atrial fibrillation, unspecified: Secondary | ICD-10-CM

## 2017-08-10 DIAGNOSIS — Z880 Allergy status to penicillin: Secondary | ICD-10-CM | POA: Insufficient documentation

## 2017-08-10 DIAGNOSIS — I63411 Cerebral infarction due to embolism of right middle cerebral artery: Secondary | ICD-10-CM | POA: Diagnosis not present

## 2017-08-10 DIAGNOSIS — R51 Headache: Secondary | ICD-10-CM | POA: Insufficient documentation

## 2017-08-10 DIAGNOSIS — I4892 Unspecified atrial flutter: Secondary | ICD-10-CM | POA: Insufficient documentation

## 2017-08-10 DIAGNOSIS — Z79899 Other long term (current) drug therapy: Secondary | ICD-10-CM | POA: Diagnosis not present

## 2017-08-10 DIAGNOSIS — Z8701 Personal history of pneumonia (recurrent): Secondary | ICD-10-CM | POA: Diagnosis not present

## 2017-08-10 DIAGNOSIS — S025XXA Fracture of tooth (traumatic), initial encounter for closed fracture: Secondary | ICD-10-CM

## 2017-08-10 DIAGNOSIS — Z8673 Personal history of transient ischemic attack (TIA), and cerebral infarction without residual deficits: Secondary | ICD-10-CM | POA: Diagnosis not present

## 2017-08-10 DIAGNOSIS — Z7901 Long term (current) use of anticoagulants: Secondary | ICD-10-CM | POA: Insufficient documentation

## 2017-08-10 DIAGNOSIS — G4489 Other headache syndrome: Secondary | ICD-10-CM

## 2017-08-10 MED ORDER — ATORVASTATIN CALCIUM 20 MG PO TABS: ORAL_TABLET | ORAL | 5 refills | Status: DC

## 2017-08-10 MED ORDER — DILTIAZEM HCL ER COATED BEADS 300 MG PO CP24: 300.0000 mg | ORAL_CAPSULE | Freq: Every day | ORAL | 5 refills | Status: DC

## 2017-08-10 MED ORDER — METOPROLOL TARTRATE 25 MG PO TABS: 25.0000 mg | ORAL_TABLET | Freq: Two times a day (BID) | ORAL | 5 refills | Status: DC

## 2017-08-10 MED ORDER — RIVAROXABAN 20 MG PO TABS: 20.0000 mg | ORAL_TABLET | Freq: Every day | ORAL | 5 refills | Status: DC

## 2017-08-10 NOTE — Patient Instructions (Signed)

## 2017-08-10 NOTE — Progress Notes (Signed)
Subjective:  Patient ID: [name], female    DOB: 11-21-1965  Age: 51 y.o. MRN: [id number]  CC: Hypertension   HPI [name]  is a 51 year old female with a history of atrial fibrillation/atrial flutter (status post cardioversion in 02/2016 currently on rate control with metoprolol and Cardizem and anticoagulation Xarelto), cerebral infarction due to embolism of right middle cerebral artery status post IV TPA (in 10/2015) with no residual deficits presents today for a follow-up visit.  She denies chest pains or shortness of breath but states her heartbeat is always irregular.  She endorses compliance with Xarelto, Cardizem and metoprolol. At her last visit with cardiology she had declined repeat DCCV.  She does have chronic headaches which she states are not bothersome and she rarely has to use her Fioricet. 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  She denies weakness in her extremity. She is requesting a referral to a dentist due to a chipped front tooth.  Past Medical History:  Diagnosis Date  . Essential hypertension   . Heart murmur    a. 10/2015 Echo: EF 60-65%, no rwma, mild AI/MR, sev dil LA/RA, PASP 61mmHg.  [name] Noncompliance   . NSVT (nonsustained ventricular tachycardia) ([place])    a. 10/2015 during admission for CVA/AF.  [name] Paroxysmal A-fib ([place])    a. CHA2DS2VASC = 4-->Xarelto;  b. 02/2016 Successful DCCV.  [name] Pneumonia 2012 x 2; 2015  . Stroke [facility])    a. 04/2422 Embolic CVA of mid right middle cerebral atery - recieved TPA-->small amt of asymptomatic hemorrhagic transformation.  . Transient ischemic attack (TIA) 01/2013    Past Surgical History:  Procedure Laterality Date  . CARDIOVERSION N/A 02/18/2013   Procedure: CARDIOVERSION;  Surgeon: Sanda Klein, MD;  Location: [place] ENDOSCOPY;  Service: Cardiovascular;  Laterality: N/A;  . CARDIOVERSION  N/A 02/23/2013   Procedure: CARDIOVERSION;  Surgeon: Leonie Man, MD;  Location: [place];  Service: Cardiovascular;  Laterality: N/A;  BESIDE CV  . CARDIOVERSION N/A 03/12/2016   Procedure: CARDIOVERSION;  Surgeon: Larey Dresser, MD;  Location: [place];  Service: Cardiovascular;  Laterality: N/A;  . TEE WITHOUT CARDIOVERSION N/A 02/18/2013   Procedure: TRANSESOPHAGEAL ECHOCARDIOGRAM (TEE);  Surgeon: Sanda Klein, MD;  Location: [facility] ENDOSCOPY;  Service: Cardiovascular;  Laterality: N/A;  . TUBAL LIGATION  1992    Allergies  Allergen Reactions  . Penicillins Hives and Other (See Comments)    Unknown  Has patient had a PCN reaction causing immediate rash, facial/tongue/throat swelling, SOB or lightheadedness with hypotension: No Has patient had a PCN reaction causing severe rash involving mucus membranes or skin necrosis: No Has patient had a PCN reaction that required hospitalization No Has patient had a PCN reaction occurring within the last 10 years: No If all of the above answers are "NO", then may proceed with Cephalosporin use.     Outpatient Medications Prior to Visit  Medication Sig Dispense Refill  . atorvastatin (LIPITOR) 20 MG tablet TAKE 1 TABLET BY MOUTH DAILY AT 6 PM. 30 tablet 5  . metoprolol tartrate (LOPRESSOR) 25 MG tablet Take 1 tablet (25 mg total) by mouth 2 (two) times daily. 60 tablet 5  . rivaroxaban (XARELTO) 20 MG TABS tablet Take 1 tablet (20 mg total) by mouth daily with supper. 30 tablet 5  . butalbital-acetaminophen-caffeine (FIORICET, ESGIC) 50-325-40 MG tablet TAKE 1  TABLET BY MOUTH EVERY 12 HOURS AS NEEDED FOR HEADACHE. (Patient not taking: Reported on 08/10/2017) 40 tablet 1  . traMADol (ULTRAM) 50 MG tablet TAKE 1 TABLET BY MOUTH EVERY 12 HOURS AS NEEDED (Patient not taking: Reported on 08/10/2017) 60 tablet 2  . diltiazem (CARDIZEM CD) 300 MG 24 hr capsule Take 1 capsule (300 mg total) by mouth daily. 30 capsule 5  . tiZANidine (ZANAFLEX) 4 MG  tablet TAKE 1 TABLET BY MOUTH EVERY 8 HOURS AS NEEDED FOR MUSCLE SPASMS. (Patient not taking: Reported on 04/22/2017) 90 tablet 1  . topiramate (TOPAMAX) 50 MG tablet Take 1 tablet (50 mg total) by mouth 2 (two) times daily. (Patient not taking: Reported on 04/22/2017) 60 tablet 5   No facility-administered medications prior to visit.     ROS Review of Systems  Constitutional: Negative for activity change, appetite change and fatigue.  HENT: Negative for congestion, sinus pressure and sore throat.   Eyes: Negative for visual disturbance.  Respiratory: Negative for cough, chest tightness, shortness of breath and wheezing.   Cardiovascular: Negative for chest pain and palpitations.  Gastrointestinal: Negative for abdominal distention, abdominal pain and constipation.  Endocrine: Negative for polydipsia.  Genitourinary: Negative for dysuria and frequency.  Musculoskeletal: Negative for arthralgias and back pain.  Skin: Negative for rash.  Neurological: Negative for tremors, light-headedness and numbness.  Hematological: Does not bruise/bleed easily.  Psychiatric/Behavioral: Negative for agitation and behavioral problems.    Objective:  BP (!) 128/93   Pulse (!) 125   Temp 98 F (36.7 C) (Oral)   Ht 5\' 3"  (1.6 m)   Wt 192 lb 6.4 oz (87.3 kg)   SpO2 100%   BMI 34.08 kg/m   BP/Weight 08/10/2017 03/23/2955 10/16/3084  Systolic BP 578 469 629  Diastolic BP 93 80 64  Wt. (Lbs) 192.4 193.4 188  BMI 34.08 34.26 32.78      Physical Exam  Constitutional: She is oriented to person, place, and time. She appears well-developed and well-nourished.  HENT:  Cracked left front incisor with blackish discoloration  Cardiovascular: Normal rate, normal heart sounds and intact distal pulses. An irregularly irregular rhythm present.  No murmur heard. Pulmonary/Chest: Effort normal and breath sounds normal. She has no wheezes. She has no rales. She exhibits no tenderness.  Abdominal: Soft. Bowel  sounds are normal. She exhibits no distension and no mass. There is no tenderness.  Musculoskeletal: Normal range of motion.  Neurological: She is alert and oriented to person, place, and time.  Skin: Skin is warm and dry.  Psychiatric: She has a normal mood and affect.     Assessment & Plan:   1. Chronic atrial fibrillation (HCC) Currently in Afib On anticoagulation with xarelto, rate control with metoprolol and Cardizem - rivaroxaban (XARELTO) 20 MG TABS tablet; Take 1 tablet (20 mg total) by mouth daily with supper.  Dispense: 30 tablet; Refill: 5 - metoprolol tartrate (LOPRESSOR) 25 MG tablet; Take 1 tablet (25 mg total) by mouth 2 (two) times daily.  Dispense: 60 tablet; Refill: 5 - diltiazem (CARDIZEM CD) 300 MG 24 hr capsule; Take 1 capsule (300 mg total) by mouth daily.  Dispense: 30 capsule; Refill: 5  2. Cerebral infarction due to embolism of right middle cerebral artery (HCC) s/p IV tPA No residual hemiparesis Risk factor modification - atorvastatin (LIPITOR) 20 MG tablet; TAKE 1 TABLET BY MOUTH DAILY AT 6 PM.  Dispense: 30 tablet; Refill: 5  3. Closed fracture of tooth, initial encounter - Ambulatory referral to  Dentistry   4.  Headache Post CVA Controlled  Meds ordered this encounter  Medications  . rivaroxaban (XARELTO) 20 MG TABS tablet    Sig: Take 1 tablet (20 mg total) by mouth daily with supper.    Dispense:  30 tablet    Refill:  5  . metoprolol tartrate (LOPRESSOR) 25 MG tablet    Sig: Take 1 tablet (25 mg total) by mouth 2 (two) times daily.    Dispense:  60 tablet    Refill:  5  . diltiazem (CARDIZEM CD) 300 MG 24 hr capsule    Sig: Take 1 capsule (300 mg total) by mouth daily.    Dispense:  30 capsule    Refill:  5  . atorvastatin (LIPITOR) 20 MG tablet    Sig: TAKE 1 TABLET BY MOUTH DAILY AT 6 PM.    Dispense:  30 tablet    Refill:  5    Follow-up: Return in about 6 months (around 02/07/2018) for Follow-up on atrial fibrillation.   Arnoldo Morale MD

## 2017-08-18 ENCOUNTER — Ambulatory Visit: Payer: BC Managed Care – PPO | Admitting: Gastroenterology

## 2017-08-18 NOTE — Progress Notes (Signed)
No show letter sent.

## 2017-08-21 MED FILL — ATORVASTATIN 20 MG TABLET: 20 | 30 days supply | Qty: 30 | Fill #0

## 2017-08-21 MED FILL — BUTALB-ACETAMIN-CAFF 50-325: 50-325-40 | 20 days supply | Qty: 40 | Fill #0

## 2017-08-21 MED FILL — CARTIA XT 300 MG CAPSULE SA: 300 | 30 days supply | Qty: 30 | Fill #0

## 2017-08-21 MED FILL — XARELTO 20 MG TABLET: 20 | 30 days supply | Qty: 30 | Fill #9

## 2017-08-21 MED FILL — METOPROLOL TARTRATE 25 MG T: 25 | 30 days supply | Qty: 60 | Fill #0

## 2017-08-24 ENCOUNTER — Ambulatory Visit: Payer: BC Managed Care – PPO

## 2017-08-26 ENCOUNTER — Encounter: Payer: Self-pay | Admitting: Family Medicine

## 2017-09-30 MED FILL — ATORVASTATIN 20 MG TABLET: 20 | 30 days supply | Qty: 30 | Fill #1

## 2017-09-30 MED FILL — XARELTO 20 MG TABLET: 20 | 30 days supply | Qty: 30 | Fill #0

## 2017-09-30 MED FILL — BUTALB-ACETAMIN-CAFF 50-325: 50-325-40 | 20 days supply | Qty: 40 | Fill #1

## 2017-09-30 MED FILL — CARTIA XT 300 MG CAPSULE SA: 300 | 30 days supply | Qty: 30 | Fill #1

## 2017-09-30 MED FILL — METOPROLOL TARTRATE 25 MG T: 25 | 30 days supply | Qty: 60 | Fill #0

## 2017-10-05 ENCOUNTER — Ambulatory Visit: Payer: BC Managed Care – PPO

## 2017-10-06 ENCOUNTER — Encounter: Payer: Self-pay | Admitting: Cardiology

## 2017-10-09 ENCOUNTER — Ambulatory Visit (INDEPENDENT_AMBULATORY_CARE_PROVIDER_SITE_OTHER): Payer: BC Managed Care – PPO | Admitting: Cardiology

## 2017-10-09 ENCOUNTER — Encounter: Payer: Self-pay | Admitting: Cardiology

## 2017-10-09 VITALS — BP 130/80 | HR 93 | Ht 63.0 in | Wt 193.2 lb

## 2017-10-09 DIAGNOSIS — Z7901 Long term (current) use of anticoagulants: Secondary | ICD-10-CM

## 2017-10-09 DIAGNOSIS — E785 Hyperlipidemia, unspecified: Secondary | ICD-10-CM

## 2017-10-09 DIAGNOSIS — Z8673 Personal history of transient ischemic attack (TIA), and cerebral infarction without residual deficits: Secondary | ICD-10-CM

## 2017-10-09 DIAGNOSIS — I482 Chronic atrial fibrillation, unspecified: Secondary | ICD-10-CM

## 2017-10-09 DIAGNOSIS — I10 Essential (primary) hypertension: Secondary | ICD-10-CM

## 2017-10-09 NOTE — Assessment & Plan Note (Signed)
DCCV June 2017. Bi atrial enlargement, probably not a candidate for antiarrythmic  Well tolerated

## 2017-10-09 NOTE — Patient Instructions (Signed)
Medication Instructions: Your physician recommends that you continue on your current medications as directed. Please refer to the Current Medication list given to you today.  If you need a refill on your cardiac medications before your next appointment, please call your pharmacy.    Follow-Up: Your physician wants you to follow-up in July with Dr. Sallyanne Kuster. You will receive a reminder letter in the mail two months in advance. If you don't receive a letter, please call our office at [phone] to schedule this follow-up appointment.   Special Instructions:  You may hold Xarelto 48 hours prior to the colonoscopy.    Thank you for choosing [facility] at [facility]!!

## 2017-10-09 NOTE — Assessment & Plan Note (Signed)
Xarelto started June 2017- CHADS VASC=4

## 2017-10-09 NOTE — Progress Notes (Signed)
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 Fortunately she has no residual effects. She had DCCV in June 2017 but failed to hold NSR. She has been on Xarelto since. She has bi atrial enl;argement and Dr Sallyanne Kuster does not think an anti arrhythmic would be effective.She tolerates her AF well,  No unusual dyapnea or tachycardia. She is in the office today for a 6 month check.    Current Outpatient Medications  Medication Sig Dispense Refill  . atorvastatin (LIPITOR) 20 MG tablet TAKE 1 TABLET BY MOUTH DAILY AT 6 PM. 30 tablet 5  . butalbital-acetaminophen-caffeine (FIORICET, ESGIC) 50-325-40 MG tablet TAKE 1 TABLET BY MOUTH EVERY 12 HOURS AS NEEDED FOR HEADACHE. 40 tablet 1  . metoprolol tartrate (LOPRESSOR) 25 MG tablet Take 1 tablet (25 mg total) by mouth 2 (two) times daily. 60 tablet 5  . rivaroxaban (XARELTO) 20 MG TABS tablet Take 1 tablet (20 mg total) by mouth daily with supper. 30 tablet 5  . traMADol (ULTRAM) 50 MG tablet TAKE 1 TABLET BY MOUTH EVERY 12 HOURS AS NEEDED 60 tablet 2  . diltiazem (CARDIZEM CD) 300 MG 24 hr capsule Take 1 capsule (300 mg total) by mouth daily. 30 capsule 5   No current facility-administered medications for this visit.     Allergies  Allergen Reactions  . Penicillins Hives and Other (See Comments)    Unknown  Has patient had a PCN reaction causing immediate rash, facial/tongue/throat swelling, SOB or lightheadedness with hypotension: No Has patient had a PCN reaction causing severe rash involving mucus membranes or skin necrosis: No Has patient had a PCN reaction that required hospitalization No Has patient had a PCN reaction occurring within the last 10 years: No If all of the above answers are "NO", then may proceed with Cephalosporin use.     Past Medical History:  Diagnosis Date  . Essential hypertension   . Heart murmur    a. 10/2015 Echo: EF 60-65%, no rwma, mild AI/MR, sev dil LA/RA, PASP 21mmHg.  [name] Noncompliance   . NSVT (nonsustained ventricular tachycardia) ([place])    a. 10/2015 during admission for CVA/AF.  [name] Paroxysmal A-fib ([place])    a. CHA2DS2VASC = 4-->Xarelto;  b. 02/2016 Successful DCCV.  [name] Pneumonia 2012 x 2; 2015  . Stroke [facility])    a. 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  . Transient ischemic attack (TIA) 01/2013    Social History   Socioeconomic History  . Marital status: Single    Spouse name: Not on file  . Number of children: Not on file  . Years of education: Not on file  . Highest education level: Not on file  Social Needs  . Financial resource strain: Not on file  . Food insecurity - worry: Not on file  . Food insecurity - inability: Not on file  . Transportation needs - medical: Not on file  . Transportation needs - non-medical: Not on file  Occupational History  . Occupation: Airline pilot: [employer]  . Occupation: in home health aide  Tobacco Use  . Smoking status: Former Smoker    Years: 3.00    Types: Cigarettes    Last attempt to quit: 03/15/2013  Years since quitting: 4.5  . Smokeless tobacco: Never Used  Substance and Sexual Activity  . Alcohol use: Yes    Comment: drinks on the weekends, 2 drinks and 2 beers  . Drug use: No  . Sexual activity: Not Currently    Birth control/protection: None  Other Topics Concern  . Not on file  Social History Narrative  . Not on file     Family History  Problem Relation Age of Onset  . Cancer Mother   . Atrial fibrillation Mother   . Breast cancer Mother   . Hypertension Father   . Atrial fibrillation Father   . Peripheral vascular disease Father   . Hypertension Unknown   . Diabetes Unknown   . Heart attack Neg Hx   .  Stroke Neg Hx      Review of Systems: General: negative for chills, fever, night sweats or weight changes.  Cardiovascular: negative for chest pain, dyspnea on exertion, edema, orthopnea, palpitations, paroxysmal nocturnal dyspnea or shortness of breath Dermatological: negative for rash Respiratory: negative for cough or wheezing Urologic: negative for hematuria Abdominal: negative for nausea, vomiting, diarrhea, bright red blood per rectum, melena, or hematemesis Neurologic: negative for visual changes, syncope, or dizziness All other systems reviewed and are otherwise negative except as noted above.    Blood pressure 130/80, pulse 93, height 5\' 3"  (1.6 m), weight 193 lb 3.2 oz (87.6 kg).  General appearance: alert, cooperative and no distress Neck: no carotid bruit and no JVD Lungs: clear to auscultation bilaterally Heart: irregularly irregular rhythm Extremities: extremities normal, atraumatic, no cyanosis or edema Skin: Skin color, texture, turgor normal. No rashes or lesions Neurologic: Grossly normal  EKG AF with CVR  ASSESSMENT AND PLAN:   Chronic atrial fibrillation [facility]) DCCV June 2017. Bi atrial enlargement, probably not a candidate for antiarrythmic  Well tolerated  Anticoagulation adequate, Xarelto started Xarelto started June 2017- CHADS VASC=4  HTN (hypertension) Controlled   PLAN  Same Rx. I discussed holding her Xarelto for colonoscopy and Dr Sallyanne Kuster feels this can be done without crossover- hold 48 hours pre op.   Kerin Ransom PA-C 10/09/2017 2:54 PM

## 2017-10-09 NOTE — Assessment & Plan Note (Signed)
Controlled.  

## 2017-10-12 ENCOUNTER — Ambulatory Visit: Payer: BC Managed Care – PPO | Admitting: Gastroenterology

## 2017-11-10 ENCOUNTER — Other Ambulatory Visit: Payer: Self-pay | Admitting: Family Medicine

## 2017-11-10 DIAGNOSIS — G4489 Other headache syndrome: Secondary | ICD-10-CM

## 2017-11-10 MED FILL — METOPROLOL TARTRATE 25 MG T: 25 | 30 days supply | Qty: 60 | Fill #1

## 2017-11-10 MED FILL — XARELTO 20 MG TABLET: 20 | 30 days supply | Qty: 30 | Fill #1

## 2017-11-10 MED FILL — CARTIA XT 300 MG CAPSULE SA: 300 | 30 days supply | Qty: 30 | Fill #2

## 2017-11-10 MED FILL — ATORVASTATIN 20 MG TABLET: 20 | 30 days supply | Qty: 30 | Fill #2

## 2017-12-09 MED FILL — CARTIA XT 300 MG CAPSULE SA: 300 | 30 days supply | Qty: 30 | Fill #3

## 2017-12-09 MED FILL — ATORVASTATIN 20 MG TABLET: 20 | 30 days supply | Qty: 30 | Fill #3

## 2017-12-09 MED FILL — XARELTO 20 MG TABLET: 20 | 30 days supply | Qty: 30 | Fill #2

## 2017-12-09 MED FILL — BUTALB-ACETAMIN-CAFF 50-325: 50-325-40 | 20 days supply | Qty: 40 | Fill #0

## 2017-12-09 MED FILL — METOPROLOL TARTRATE 25 MG T: 25 | 30 days supply | Qty: 60 | Fill #2

## 2017-12-15 ENCOUNTER — Encounter: Payer: Self-pay | Admitting: Gastroenterology

## 2017-12-15 ENCOUNTER — Ambulatory Visit (INDEPENDENT_AMBULATORY_CARE_PROVIDER_SITE_OTHER): Payer: BC Managed Care – PPO | Admitting: Gastroenterology

## 2017-12-15 ENCOUNTER — Telehealth: Payer: Self-pay

## 2017-12-15 VITALS — BP 126/88 | HR 74 | Ht 63.5 in | Wt 191.0 lb

## 2017-12-15 DIAGNOSIS — Z7901 Long term (current) use of anticoagulants: Secondary | ICD-10-CM | POA: Diagnosis not present

## 2017-12-15 DIAGNOSIS — Z1211 Encounter for screening for malignant neoplasm of colon: Secondary | ICD-10-CM | POA: Diagnosis not present

## 2017-12-15 DIAGNOSIS — R194 Change in bowel habit: Secondary | ICD-10-CM

## 2017-12-15 DIAGNOSIS — K625 Hemorrhage of anus and rectum: Secondary | ICD-10-CM | POA: Diagnosis not present

## 2017-12-15 NOTE — Patient Instructions (Addendum)
If you are age 54 or older, your body mass index should be between 23-30. Your Body mass index is 33.3 kg/m. If this is out of the aforementioned range listed, please consider follow up with your Primary Care Provider.  If you are age 84 or younger, your body mass index should be between 19-25. Your Body mass index is 33.3 kg/m. If this is out of the aformentioned range listed, please consider follow up with your Primary Care Provider.   You have been scheduled for a colonoscopy. Please follow written instructions given to you at your visit today.  Please pick up your prep supplies at the pharmacy within the next 1-3 days. If you use inhalers (even only as needed), please bring them with you on the day of your procedure. Your physician has requested that you go to www.startemmi.com and enter the access code given to you at your visit today. This web site gives a general overview about your procedure. However, you should still follow specific instructions given to you by our office regarding your preparation for the procedure.  You will be contacted by our office prior to your procedure for directions on holding your Xarelto.  If you do not hear from our office 1 week prior to your scheduled procedure, please call [phone] to discuss.   We are giving you a Low FOD-MAP diet for you to follow.  You can try Citrucel over-the-counter daily.  Thank you for entrusting me with your care and for choosing [facility], Dr. [place] Cellar

## 2017-12-15 NOTE — Progress Notes (Signed)
HPI :  52 y/o female with a history of AF and embolic CVA in 03/16 on Xarelto, history of HTN, referred by [name], MD for change in bowel habits and colon cancer screening.  The patient reports she frequently has the urge to have a bowel movement after eating a meal. She has a few bowel movements every day. She has occasional straining with her bowels and feels constipated at times. Over the past 3 months she's had occasional red blood in her stools in the setting of straining. She endorses some borborygmi and comments this is very loud at times. She also endorses some gas and bloating that bothers her. She feels that some of the bloating and bowel changes can be related to her food intake, she works at the school system preparing meals.She otherwise denies any associated abdominal pains. No weight loss. No new medications. She states her mother and sister have both had polyps removed but she does not have any family history of colon cancer. She takes a relatively low for history of atrial fibrillation and states she is always in A. Fib, tolerates this well without any symptoms. She is followed closely by cardiology. Her last echo was in 2017 as outlined. she's never had a prior colonoscopy.  Echo 11/03/2015 - EF 60-65%   Past Medical History:  Diagnosis Date  . Essential hypertension   . Heart murmur    a. 10/2015 Echo: EF 60-65%, no rwma, mild AI/MR, sev dil LA/RA, PASP 48mmHg.  [name] Noncompliance   . NSVT (nonsustained ventricular tachycardia) ([place])    a. 10/2015 during admission for CVA/AF.  [name] Paroxysmal A-fib ([place])    a. CHA2DS2VASC = 4-->Xarelto;  b. 02/2016 Successful DCCV.  [name] Pneumonia 2012 x 2; 2015  . Stroke [facility])    a. 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  . Transient ischemic attack (TIA) 01/2013     Past Surgical History:  Procedure Laterality Date  . CARDIOVERSION N/A 02/18/2013   Procedure: CARDIOVERSION;  Surgeon: Sanda Klein, MD;  Location: [facility];  Service: Cardiovascular;  Laterality: N/A;  . CARDIOVERSION N/A 02/23/2013   Procedure: CARDIOVERSION;  Surgeon: Leonie Man, MD;  Location: [place];  Service: Cardiovascular;  Laterality: N/A;  BESIDE CV  . CARDIOVERSION N/A 03/12/2016   Procedure: CARDIOVERSION;  Surgeon: Larey Dresser, MD;  Location: [place];  Service: Cardiovascular;  Laterality: N/A;  . TEE WITHOUT CARDIOVERSION N/A 02/18/2013   Procedure: TRANSESOPHAGEAL ECHOCARDIOGRAM (TEE);  Surgeon: Sanda Klein, MD;  Location: [facility];  Service: Cardiovascular;  Laterality: N/A;  . TUBAL LIGATION  1992   Family History  Problem Relation Age of Onset  . Cancer Mother   . Atrial fibrillation Mother   . Breast cancer Mother   . Hypertension Father   . Atrial fibrillation Father   . Peripheral vascular disease Father   . Hypertension Unknown   . Diabetes Unknown   . Heart attack Neg Hx   . Stroke Neg Hx    Social History   Tobacco Use  . Smoking status: Former Smoker    Years: 3.00    Types: Cigarettes    Last attempt to quit: 03/15/2013    Years since quitting: 4.7  . Smokeless tobacco: Never Used  Substance Use Topics  . Alcohol use: Yes    Comment: drinks on the weekends, 2 drinks and 2 beers  . Drug use: No   Current Outpatient Medications  Medication Sig Dispense Refill  . atorvastatin (LIPITOR) 20 MG tablet TAKE 1 TABLET BY MOUTH DAILY AT 6 PM. 30 tablet 5  . butalbital-acetaminophen-caffeine (FIORICET, ESGIC) 50-325-40 MG tablet TAKE 1 TABLET BY MOUTH EVERY 12 HOURS AS NEEDED FOR HEADACHE 40 tablet 1  . metoprolol tartrate (LOPRESSOR) 25 MG tablet Take 1 tablet (25 mg total) by mouth 2 (two) times daily. 60 tablet 5  . rivaroxaban (XARELTO) 20 MG TABS tablet Take 1 tablet (20 mg total) by mouth daily with supper. 30 tablet 5  . diltiazem (CARDIZEM CD) 300 MG 24 hr capsule Take 1 capsule (300 mg total) by mouth daily. 30  capsule 5   No current facility-administered medications for this visit.    Allergies  Allergen Reactions  . Penicillins Hives and Other (See Comments)    Unknown  Has patient had a PCN reaction causing immediate rash, facial/tongue/throat swelling, SOB or lightheadedness with hypotension: No Has patient had a PCN reaction causing severe rash involving mucus membranes or skin necrosis: No Has patient had a PCN reaction that required hospitalization No Has patient had a PCN reaction occurring within the last 10 years: No If all of the above answers are "NO", then may proceed with Cephalosporin use.     Review of Systems: All systems reviewed and negative except where noted in HPI.   Lab Results  Component Value Date   WBC 9.2 03/07/2016   HGB 13.2 03/07/2016   HCT 40.8 03/07/2016   MCV 82.8 03/07/2016   PLT 222 03/07/2016    Lab Results  Component Value Date   CREATININE 0.83 03/03/2017   BUN 11 03/03/2017   NA 143 03/03/2017   K 3.9 03/03/2017   CL 104 03/03/2017   CO2 23 03/03/2017      Physical Exam: BP 126/88   Pulse 74   Ht 5' 3.5" (1.613 m)   Wt 191 lb (86.6 kg)   BMI 33.30 kg/m  Constitutional: Pleasant,well-developed,female in no acute distress. HEENT: Normocephalic and atraumatic. Conjunctivae are normal. No scleral icterus. Neck supple.  Cardiovascular:  irregularly irregular  Pulmonary/chest: Effort normal and breath sounds normal. No wheezing, rales or rhonchi. Abdominal: Soft, nondistended, nontender. There are no masses palpable. No hepatomegaly. Extremities: no edema Lymphadenopathy: No cervical adenopathy noted. Neurological: Alert and oriented to person place and time. Skin: Skin is warm and dry. No rashes noted. Psychiatric: Normal mood and affect. Behavior is normal.   ASSESSMENT AND PLAN: 52 year old female with medical history as outlined, on Xarelto, presenting with some changes in bowel habits and rectal bleeding. She's had no prior  colon cancer screening.  Recommend colonoscopy at this time to further evaluate and perform screening. I discussed with the colonoscopy is and potential risks and benefits. She will need to hold her Xarelto for 2 days prior to the colonoscopy, we will seek approval from her prescribing provider. Following this discussion she wanted to proceed with the procedure. In the interim I counseled her on a low FODMAP diet to help reduce gas and bloating. Regarding her bowel habit she may want to consider a daily fiber supplement such as Citrucel to help soften stools. Otherwise we'll await results of colonoscopy. She agreed with the plan.    Cellar, MD [facility]  CC: [name], MD

## 2017-12-15 NOTE — Telephone Encounter (Signed)
[facility] Pre-operative Risk Assessment     Request for surgical clearance:     Endoscopy Procedure  What type of surgery is being performed?     Colonoscopy  When is this surgery scheduled?     01-04-18  What type of clearance is required ?   Pharmacy  Are there any medications that need to be held prior to surgery and how long? Xarelto, 2 days  Practice name and name of physician performing surgery?      [facility], Dr. Flemington Cellar  What is your office phone and fax number?      Phone- [phone]  [phone]  Anesthesia type (None, local, MAC, general) ?       MAC

## 2017-12-17 ENCOUNTER — Other Ambulatory Visit: Payer: Self-pay

## 2017-12-17 MED ORDER — SUPREP BOWEL PREP KIT 17.5-3.13-1.6 GM/177ML PO SOLN: ORAL | 0 refills | Status: DC

## 2017-12-17 NOTE — Telephone Encounter (Signed)
Called and spoke to pt.  Advised that she can hold Xarelto for 1 day prior to procedure according to Cardiology.  She asked if she should take aspirin on that day.  She thought Dr. Loni Muse said that would be OK.  Please advise.

## 2017-12-17 NOTE — Progress Notes (Signed)
Resend suprep to pt pharmacy

## 2017-12-17 NOTE — Telephone Encounter (Signed)
Patient with diagnosis of atrial fibrillation on Xarelto for anticoagulation.    Procedure: colonoscopy Date of procedure: 01/04/18  CHADS2-VASc score of  4 (, HTN, , stroke/tia x 2, , female)  CrCl 107.2 Platelet count 222 (from June 2017)  Per office protocol, patient can hold Xarelto for 1 day prior to procedure.  Please note that patient has history of embolic stroke in February 2017    Patient should restart Xarelto on the evening of procedure or day after, at discretion of procedure MD

## 2017-12-18 MED FILL — SUPREP BOWEL PREP KIT: 17.5-3.13-1 | 1 days supply | Qty: 354 | Fill #0

## 2017-12-21 NOTE — Telephone Encounter (Signed)
Yes she can take an aspirin while she holds the Xarelto.  Please counsel her that it is okay to hold the Xarelto for 1 day prior to the procedure, but she needs to make sure her last dose is at least > 24 hours prior to her procedure time. Thanks

## 2017-12-21 NOTE — Telephone Encounter (Signed)
Pt advised.

## 2018-01-04 ENCOUNTER — Encounter: Payer: BC Managed Care – PPO | Admitting: Gastroenterology

## 2018-01-21 ENCOUNTER — Other Ambulatory Visit: Payer: Self-pay

## 2018-01-21 ENCOUNTER — Ambulatory Visit (AMBULATORY_SURGERY_CENTER): Payer: BC Managed Care – PPO | Admitting: Gastroenterology

## 2018-01-21 ENCOUNTER — Encounter: Payer: BC Managed Care – PPO | Admitting: Gastroenterology

## 2018-01-21 ENCOUNTER — Encounter: Payer: Self-pay | Admitting: Gastroenterology

## 2018-01-21 VITALS — BP 130/78 | HR 116 | Temp 97.5°F | Resp 26 | Ht 63.0 in | Wt 191.0 lb

## 2018-01-21 DIAGNOSIS — K625 Hemorrhage of anus and rectum: Secondary | ICD-10-CM

## 2018-01-21 DIAGNOSIS — R194 Change in bowel habit: Secondary | ICD-10-CM

## 2018-01-21 DIAGNOSIS — D123 Benign neoplasm of transverse colon: Secondary | ICD-10-CM | POA: Diagnosis not present

## 2018-01-21 MED ORDER — SODIUM CHLORIDE 0.9 % IV SOLN: 500.0000 mL | Freq: Once | INTRAVENOUS | Status: DC

## 2018-01-21 NOTE — Progress Notes (Signed)
Report given to PACU, vss 

## 2018-01-21 NOTE — Progress Notes (Signed)
Last dose of xarelto on 01/19/18

## 2018-01-21 NOTE — Progress Notes (Signed)
Called to room to assist during endoscopic procedure.  Patient ID and intended procedure confirmed with present staff. Received instructions for my participation in the procedure from the performing physician.  

## 2018-01-21 NOTE — Patient Instructions (Signed)
YOU HAD AN ENDOSCOPIC PROCEDURE TODAY AT [facility]:   Refer to the procedure report that was given to you for any specific questions about what was found during the examination.  If the procedure report does not answer your questions, please call your gastroenterologist to clarify.  If you requested that your care partner not be given the details of your procedure findings, then the procedure report has been included in a sealed envelope for you to review at your convenience later.  YOU SHOULD EXPECT: Some feelings of bloating in the abdomen. Passage of more gas than usual.  Walking can help get rid of the air that was put into your GI tract during the procedure and reduce the bloating. If you had a lower endoscopy (such as a colonoscopy or flexible sigmoidoscopy) you may notice spotting of blood in your stool or on the toilet paper. If you underwent a bowel prep for your procedure, you may not have a normal bowel movement for a few days.  Please Note:  You might notice some irritation and congestion in your nose or some drainage.  This is from the oxygen used during your procedure.  There is no need for concern and it should clear up in a day or so.  SYMPTOMS TO REPORT IMMEDIATELY:   Following lower endoscopy (colonoscopy or flexible sigmoidoscopy):  Excessive amounts of blood in the stool  Significant tenderness or worsening of abdominal pains  Swelling of the abdomen that is new, acute  Fever of 100F or higher   For urgent or emergent issues, a gastroenterologist can be reached at any hour by calling [phone].   DIET:  We do recommend a small meal at first, but then you may proceed to your regular diet.  Drink plenty of fluids but you should avoid alcoholic beverages for 24 hours.  ACTIVITY:  You should plan to take it easy for the rest of today and you should NOT DRIVE or use heavy machinery until tomorrow (because of the sedation medicines used during the test).     FOLLOW UP: Our staff will call the number listed on your records the next business day following your procedure to check on you and address any questions or concerns that you may have regarding the information given to you following your procedure. If we do not reach you, we will leave a message.  However, if you are feeling well and you are not experiencing any problems, there is no need to return our call.  We will assume that you have returned to your regular daily activities without incident.  If any biopsies were taken you will be contacted by phone or by letter within the next 1-3 weeks.  Please call us at [phone] if you have not heard about the biopsies in 3 weeks.    SIGNATURES/CONFIDENTIALITY: You and/or your care partner have signed paperwork which will be entered into your electronic medical record.  These signatures attest to the fact that that the information above on your After Visit Summary has been reviewed and is understood.  Full responsibility of the confidentiality of this discharge information lies with you and/or your care-partner.  You may resume your xarelto tomorrow per Dr. Havery Moros.

## 2018-01-21 NOTE — Op Note (Signed)
[place] Patient Name: [name] Procedure Date: 01/21/2018 2:26 PM MRN: [id number] Endoscopist: Remo Lipps P. Juris Gosnell MD, MD Age: 52 Referring MD:  Date of Birth: 09-17-1965 Gender: Female Account #: [id number] Procedure:                Colonoscopy Indications:              This is the patient's first colonoscopy, Rectal                            bleeding, Change in bowel habits Medicines:                Monitored Anesthesia Care Procedure:                Pre-Anesthesia Assessment:                           - Prior to the procedure, a History and Physical                            was performed, and patient medications and                            allergies were reviewed. The patient's tolerance of                            previous anesthesia was also reviewed. The risks                            and benefits of the procedure and the sedation                            options and risks were discussed with the patient.                            All questions were answered, and informed consent                            was obtained. Prior Anticoagulants: The patient has                            taken Xarelto (rivaroxaban), last dose was 1 day                            prior to procedure. ASA Grade Assessment: III - A                            patient with severe systemic disease. After                            reviewing the risks and benefits, the patient was                            deemed in satisfactory condition to undergo the  procedure.                           After obtaining informed consent, the colonoscope                            was passed under direct vision. Throughout the                            procedure, the patient's blood pressure, pulse, and                            oxygen saturations were monitored continuously. The                            Colonoscope was introduced through the anus and                             advanced to the the terminal ileum, with                            identification of the appendiceal orifice and IC                            valve. The colonoscopy was performed without                            difficulty. The patient tolerated the procedure                            well. The quality of the bowel preparation was                            good. The terminal ileum, ileocecal valve,                            appendiceal orifice, and rectum were photographed. Scope In: 2:36:55 PM Scope Out: 2:55:03 PM Scope Withdrawal Time: 0 hours 14 minutes 49 seconds  Total Procedure Duration: 0 hours 18 minutes 8 seconds  Findings:                 The perianal and digital rectal examinations were                            normal.                           The terminal ileum appeared normal.                           Two sessile polyps were found in the transverse                            colon. The polyps were 3 to 4 mm in size. These  polyps were removed with a cold snare. Resection                            and retrieval were complete.                           Scattered medium-mouthed diverticula were found in                            the transverse colon and right colon.                           A localized area of mucosa was found at the anal                            canal with a change in texture. Biopsies were taken                            with a cold forceps for histology to ensure no                            adenomatous change.                           Internal hemorrhoids were found during retroflexion.                           The exam was otherwise without abnormality. Complications:            No immediate complications. Estimated blood loss:                            Minimal. Estimated Blood Loss:     Estimated blood loss was minimal. Impression:               - The examined portion of the ileum was  normal.                           - Two 3 to 4 mm polyps in the transverse colon,                            removed with a cold snare. Resected and retrieved.                           - Diverticulosis in the transverse colon and in the                            right colon.                           - Mucosa at the anus as outlined. Biopsied.                           - Internal hemorrhoids.                           -  The examination was otherwise normal. Recommendation:           - Patient has a contact number available for                            emergencies. The signs and symptoms of potential                            delayed complications were discussed with the                            patient. Return to normal activities tomorrow.                            Written discharge instructions were provided to the                            patient.                           - Resume previous diet.                           - Continue present medications.                           - Resume Xarelto tomorrow                           - Await pathology results.                           - Repeat colonoscopy for surveillance based on                            pathology results. Remo Lipps P. Laelia Angelo MD, MD 01/21/2018 3:01:18 PM This report has been signed electronically.

## 2018-01-22 ENCOUNTER — Telehealth: Payer: Self-pay

## 2018-01-22 NOTE — Telephone Encounter (Signed)
Left message

## 2018-01-28 ENCOUNTER — Encounter: Payer: Self-pay | Admitting: Gastroenterology

## 2018-01-28 MED FILL — XARELTO 20 MG TABLET: 20 | 30 days supply | Qty: 30 | Fill #3

## 2018-02-08 ENCOUNTER — Emergency Department (HOSPITAL_COMMUNITY): Payer: BC Managed Care – PPO

## 2018-02-08 ENCOUNTER — Other Ambulatory Visit: Payer: Self-pay

## 2018-02-08 ENCOUNTER — Inpatient Hospital Stay (HOSPITAL_COMMUNITY): Payer: BC Managed Care – PPO

## 2018-02-08 ENCOUNTER — Encounter (HOSPITAL_COMMUNITY): Payer: Self-pay | Admitting: Emergency Medicine

## 2018-02-08 ENCOUNTER — Inpatient Hospital Stay (HOSPITAL_COMMUNITY)
Admission: EM | Admit: 2018-02-08 | Discharge: 2018-02-10 | DRG: 066 | Disposition: A | Discharge status: Home / Self Care | Payer: BC Managed Care – PPO | Attending: Family Medicine | Admitting: Family Medicine

## 2018-02-08 DIAGNOSIS — Z803 Family history of malignant neoplasm of breast: Secondary | ICD-10-CM | POA: Diagnosis not present

## 2018-02-08 DIAGNOSIS — Z79899 Other long term (current) drug therapy: Secondary | ICD-10-CM

## 2018-02-08 DIAGNOSIS — T45516A Underdosing of anticoagulants, initial encounter: Secondary | ICD-10-CM | POA: Diagnosis present

## 2018-02-08 DIAGNOSIS — Z7901 Long term (current) use of anticoagulants: Secondary | ICD-10-CM | POA: Diagnosis not present

## 2018-02-08 DIAGNOSIS — Z9851 Tubal ligation status: Secondary | ICD-10-CM | POA: Diagnosis not present

## 2018-02-08 DIAGNOSIS — E785 Hyperlipidemia, unspecified: Secondary | ICD-10-CM | POA: Diagnosis present

## 2018-02-08 DIAGNOSIS — E669 Obesity, unspecified: Secondary | ICD-10-CM | POA: Diagnosis present

## 2018-02-08 DIAGNOSIS — R4781 Slurred speech: Secondary | ICD-10-CM | POA: Diagnosis present

## 2018-02-08 DIAGNOSIS — Z87891 Personal history of nicotine dependence: Secondary | ICD-10-CM | POA: Diagnosis not present

## 2018-02-08 DIAGNOSIS — I63511 Cerebral infarction due to unspecified occlusion or stenosis of right middle cerebral artery: Secondary | ICD-10-CM | POA: Diagnosis not present

## 2018-02-08 DIAGNOSIS — I482 Chronic atrial fibrillation, unspecified: Secondary | ICD-10-CM | POA: Diagnosis present

## 2018-02-08 DIAGNOSIS — Z91120 Patient's intentional underdosing of medication regimen due to financial hardship: Secondary | ICD-10-CM

## 2018-02-08 DIAGNOSIS — I63411 Cerebral infarction due to embolism of right middle cerebral artery: Secondary | ICD-10-CM | POA: Diagnosis not present

## 2018-02-08 DIAGNOSIS — I69392 Facial weakness following cerebral infarction: Secondary | ICD-10-CM | POA: Diagnosis not present

## 2018-02-08 DIAGNOSIS — Z6832 Body mass index (BMI) 32.0-32.9, adult: Secondary | ICD-10-CM | POA: Diagnosis not present

## 2018-02-08 DIAGNOSIS — Z880 Allergy status to penicillin: Secondary | ICD-10-CM | POA: Diagnosis not present

## 2018-02-08 DIAGNOSIS — R29810 Facial weakness: Secondary | ICD-10-CM

## 2018-02-08 DIAGNOSIS — I361 Nonrheumatic tricuspid (valve) insufficiency: Secondary | ICD-10-CM | POA: Diagnosis not present

## 2018-02-08 DIAGNOSIS — R29701 NIHSS score 1: Secondary | ICD-10-CM | POA: Diagnosis present

## 2018-02-08 DIAGNOSIS — Z8249 Family history of ischemic heart disease and other diseases of the circulatory system: Secondary | ICD-10-CM

## 2018-02-08 DIAGNOSIS — I10 Essential (primary) hypertension: Secondary | ICD-10-CM | POA: Diagnosis present

## 2018-02-08 DIAGNOSIS — I639 Cerebral infarction, unspecified: Secondary | ICD-10-CM

## 2018-02-08 DIAGNOSIS — I4581 Long QT syndrome: Secondary | ICD-10-CM | POA: Diagnosis present

## 2018-02-08 LAB — COMPREHENSIVE METABOLIC PANEL
ALBUMIN: 4.1 g/dL (ref 3.5–5.0)
ALK PHOS: 108 U/L (ref 38–126)
ALT: 23 U/L (ref 14–54)
AST: 30 U/L (ref 15–41)
Anion gap: 10 (ref 5–15)
BILIRUBIN TOTAL: 0.7 mg/dL (ref 0.3–1.2)
BUN: 9 mg/dL (ref 6–20)
CALCIUM: 9.5 mg/dL (ref 8.9–10.3)
CO2: 25 mmol/L (ref 22–32)
Chloride: 107 mmol/L (ref 101–111)
Creatinine, Ser: 0.85 mg/dL (ref 0.44–1.00)
GFR calc Af Amer: >60 mL/min (ref >60)
GFR calc non Af Amer: >60 mL/min (ref >60)
Glucose, Bld: 112 mg/dL — ABNORMAL HIGH (ref 65–99)
Potassium: 3.7 mmol/L (ref 3.5–5.1)
Sodium: 142 mmol/L (ref 135–145)
TOTAL PROTEIN: 8.9 g/dL — AB (ref 6.5–8.1)

## 2018-02-08 LAB — PROTIME-INR
INR: 1.79
PROTHROMBIN TIME: 20.7 s — AB (ref 11.4–15.2)

## 2018-02-08 LAB — DIFFERENTIAL
ABS IMMATURE GRANULOCYTES: 0 10*3/uL (ref 0.0–0.1)
BASOS ABS: 0.1 10*3/uL (ref 0.0–0.1)
BASOS PCT: 1 %
EOS ABS: 0.2 10*3/uL (ref 0.0–0.7)
Eosinophils Relative: 1 %
IMMATURE GRANULOCYTES: 0 %
Lymphocytes Relative: 25 %
Lymphs Abs: 2.6 10*3/uL (ref 0.7–4.0)
Monocytes Absolute: 0.7 10*3/uL (ref 0.1–1.0)
Monocytes Relative: 6 %
NEUTROS PCT: 67 %
Neutro Abs: 7 10*3/uL (ref 1.7–7.7)

## 2018-02-08 LAB — CBC
HCT: 45.4 % (ref 36.0–46.0)
Hemoglobin: 14.1 g/dL (ref 12.0–15.0)
MCH: 25.7 pg — ABNORMAL LOW (ref 26.0–34.0)
MCHC: 31.1 g/dL (ref 30.0–36.0)
MCV: 82.8 fL (ref 78.0–100.0)
PLATELETS: 222 10*3/uL (ref 150–400)
RBC: 5.48 MIL/uL — AB (ref 3.87–5.11)
RDW: 15.5 % (ref 11.5–15.5)
WBC: 10.5 10*3/uL (ref 4.0–10.5)

## 2018-02-08 LAB — I-STAT CHEM 8, ED
BUN: 11 mg/dL (ref 6–20)
CALCIUM ION: 1.19 mmol/L (ref 1.15–1.40)
CREATININE: 0.7 mg/dL (ref 0.44–1.00)
Chloride: 106 mmol/L (ref 101–111)
GLUCOSE: 106 mg/dL — AB (ref 65–99)
HCT: 48 % — ABNORMAL HIGH (ref 36.0–46.0)
Hemoglobin: 16.3 g/dL — ABNORMAL HIGH (ref 12.0–15.0)
Potassium: 3.6 mmol/L (ref 3.5–5.1)
Sodium: 143 mmol/L (ref 135–145)
TCO2: 24 mmol/L (ref 22–32)

## 2018-02-08 LAB — I-STAT TROPONIN, ED: TROPONIN I, POC: 0 ng/mL (ref 0.00–0.08)

## 2018-02-08 LAB — CBG MONITORING, ED: GLUCOSE-CAPILLARY: 99 mg/dL (ref 65–99)

## 2018-02-08 LAB — I-STAT BETA HCG BLOOD, ED (MC, WL, AP ONLY): I-stat hCG, quantitative: <5 m[IU]/mL (ref <5)

## 2018-02-08 LAB — APTT: APTT: 33 s (ref 24–36)

## 2018-02-08 MED ORDER — ACETAMINOPHEN 650 MG RE SUPP: 650.0000 mg | RECTAL | Status: DC | PRN

## 2018-02-08 MED ORDER — ASPIRIN 325 MG PO TABS
325.0000 mg | ORAL_TABLET | Freq: Every day | ORAL | Status: DC
  Administered 2018-02-09: 325 mg via ORAL
  Filled 2018-02-08 (×2): qty 1

## 2018-02-08 MED ORDER — ATORVASTATIN CALCIUM 10 MG PO TABS: 20.0000 mg | ORAL_TABLET | Freq: Every day | ORAL | Status: DC

## 2018-02-08 MED ORDER — ACETAMINOPHEN 160 MG/5ML PO SOLN: 650.0000 mg | ORAL | Status: DC | PRN

## 2018-02-08 MED ORDER — ASPIRIN 300 MG RE SUPP: 300.0000 mg | Freq: Every day | RECTAL | Status: DC

## 2018-02-08 MED ORDER — DILTIAZEM HCL ER COATED BEADS 180 MG PO CP24
300.0000 mg | ORAL_CAPSULE | Freq: Every day | ORAL | Status: DC
  Administered 2018-02-09 – 2018-02-10 (×2): 300 mg via ORAL
  Filled 2018-02-08 (×2): qty 1

## 2018-02-08 MED ORDER — SODIUM CHLORIDE 0.9 % IV BOLUS
500.0000 mL | Freq: Once | INTRAVENOUS | Status: AC
  Administered 2018-02-08: 500 mL via INTRAVENOUS

## 2018-02-08 MED ORDER — SENNOSIDES-DOCUSATE SODIUM 8.6-50 MG PO TABS: 1.0000 | ORAL_TABLET | Freq: Every evening | ORAL | Status: DC | PRN

## 2018-02-08 MED ORDER — ASPIRIN 325 MG PO TABS: 325.0000 mg | ORAL_TABLET | Freq: Every day | ORAL | Status: DC

## 2018-02-08 MED ORDER — ACETAMINOPHEN 325 MG PO TABS
650.0000 mg | ORAL_TABLET | ORAL | Status: DC | PRN
  Administered 2018-02-08 – 2018-02-09 (×2): 650 mg via ORAL
  Filled 2018-02-08 (×2): qty 2

## 2018-02-08 MED ORDER — STROKE: EARLY STAGES OF RECOVERY BOOK
Freq: Once | Status: AC
  Administered 2018-02-08
  Filled 2018-02-08: qty 1

## 2018-02-08 MED ORDER — METOPROLOL TARTRATE 25 MG PO TABS: 25.0000 mg | ORAL_TABLET | Freq: Two times a day (BID) | ORAL | Status: DC

## 2018-02-08 MED ORDER — IOPAMIDOL (ISOVUE-370) INJECTION 76%
50.0000 mL | Freq: Once | INTRAVENOUS | Status: AC | PRN
  Administered 2018-02-08: 50 mL via INTRAVENOUS

## 2018-02-08 NOTE — ED Notes (Signed)
Received notice from tech that a code stroke was coming from triage. Arrived at desk, no one from stroke team or patient in area. Began looking for patient. Patient was not in CT. Located patient in triage with nurse attempting to get IV. Expressed need to take patient to CT. Patient arrived to CT at 1443, outside of recommended timeframe.

## 2018-02-08 NOTE — H&P (Addendum)
History and Physical    TRIXY LOYOLA OAC:[id number] DOB: 1966/01/17 DOA: 02/08/2018  PCP: [name], MD  Patient coming from: Home  I have personally briefly reviewed patient's old medical records in [place]  Chief Complaint: Slurred speech.  HPI: [name] is a 52 y.o. female with medical history significant of A.Fib, prior embolic CVA, on Chronic Xarelto.  Patient presents to the ED with c/o slurred speech. Symptoms onset 12:30 today.  Report of slurred speech from friend who she was on phone with at the time.  No headache.  Did notice drooling from the left side as well as tingling sensation to the left side of face.  Not missed any Xarelto doses, took does of Xarelto today at 1300.  Apparently L facial droop is chronic from prior CVA.   ED Course: CTA head and neck is negative.  MRI demonstrates acute R MCA ischemic stroke.   Review of Systems: As per HPI otherwise 10 point review of systems negative.   Past Medical History:  Diagnosis Date  . Clotting disorder ([place])    on xarelto  . Essential hypertension   . Heart murmur    a. 10/2015 Echo: EF 60-65%, no rwma, mild AI/MR, sev dil LA/RA, PASP 80mmHg.  [name] Noncompliance   . NSVT (nonsustained ventricular tachycardia) ([place])    a. 10/2015 during admission for CVA/AF.  [name] Paroxysmal A-fib ([place])    a. CHA2DS2VASC = 4-->Xarelto;  b. 02/2016 Successful DCCV.  [name] Pneumonia 2012 x 2; 2015  . Stroke [facility])    a. 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  . Transient ischemic attack (TIA) 01/2013    Past Surgical History:  Procedure Laterality Date  . CARDIOVERSION N/A 02/18/2013   Procedure: CARDIOVERSION;  Surgeon: Sanda Klein, MD;  Location: [facility] ENDOSCOPY;  Service: Cardiovascular;  Laterality: N/A;  . CARDIOVERSION N/A 02/23/2013   Procedure: CARDIOVERSION;  Surgeon: Leonie Man, MD;  Location: [place];  Service:  Cardiovascular;  Laterality: N/A;  BESIDE CV  . CARDIOVERSION N/A 03/12/2016   Procedure: CARDIOVERSION;  Surgeon: Larey Dresser, MD;  Location: [place];  Service: Cardiovascular;  Laterality: N/A;  . TEE WITHOUT CARDIOVERSION N/A 02/18/2013   Procedure: TRANSESOPHAGEAL ECHOCARDIOGRAM (TEE);  Surgeon: Sanda Klein, MD;  Location: [facility] ENDOSCOPY;  Service: Cardiovascular;  Laterality: N/A;  . [place]     reports that she quit smoking about 4 years ago. Her smoking use included cigarettes. She quit after 3.00 years of use. She has never used smokeless tobacco. She reports that she drinks alcohol. She reports that she does not use drugs.  Allergies  Allergen Reactions  . Penicillins Hives and Other (See Comments)    Unknown  Has patient had a PCN reaction causing immediate rash, facial/tongue/throat swelling, SOB or lightheadedness with hypotension: No Has patient had a PCN reaction causing severe rash involving mucus membranes or skin necrosis: No Has patient had a PCN reaction that required hospitalization No Has patient had a PCN reaction occurring within the last 10 years: No If all of the above answers are "NO", then may proceed with Cephalosporin use.    Family History  Problem Relation Age of Onset  . Cancer Mother   . Atrial fibrillation Mother   . Breast cancer Mother   . Hypertension Father   . Atrial fibrillation Father   . Peripheral vascular disease Father   . Hypertension Unknown   .  Diabetes Unknown   . Heart attack Neg Hx   . Stroke Neg Hx   . Rectal cancer Neg Hx   . Colon cancer Neg Hx   . Esophageal cancer Neg Hx   . Stomach cancer Neg Hx      Prior to Admission medications   Medication Sig Start Date End Date Taking? Authorizing Provider  atorvastatin (LIPITOR) 20 MG tablet TAKE 1 TABLET BY MOUTH DAILY AT 6 PM. 08/10/17  Yes Newlin, Enobong, MD  butalbital-acetaminophen-caffeine (FIORICET, ESGIC) 50-325-40 MG tablet TAKE 1 TABLET BY MOUTH  EVERY 12 HOURS AS NEEDED FOR HEADACHE 11/12/17  Yes Newlin, Enobong, MD  diltiazem (CARDIZEM CD) 300 MG 24 hr capsule Take 1 capsule (300 mg total) by mouth daily. 08/10/17 02/08/18 Yes [name], MD  metoprolol tartrate (LOPRESSOR) 25 MG tablet Take 1 tablet (25 mg total) by mouth 2 (two) times daily. 08/10/17  Yes [name], MD  rivaroxaban (XARELTO) 20 MG TABS tablet Take 1 tablet (20 mg total) by mouth daily with supper. 08/10/17  Yes [name], MD    Physical Exam: Vitals:   02/08/18 1800 02/08/18 1830 02/08/18 1900 02/08/18 2100  BP: (!) 156/118 (!) 142/99 (!) 167/98 (!) 161/109  Pulse: (!) 104 85 97 (!) 45  Resp: (!) 21 (!) 23 (!) 26 20  Temp:      TempSrc:      SpO2: 100% 97% 98% 98%  Weight:      Height:        Constitutional: NAD, calm, comfortable Eyes: PERRL, lids and conjunctivae normal ENMT: Mucous membranes are moist. Posterior pharynx clear of any exudate or lesions.Normal dentition.  Neck: normal, supple, no masses, no thyromegaly Respiratory: clear to auscultation bilaterally, no wheezing, no crackles. Normal respiratory effort. No accessory muscle use.  Cardiovascular: Regular rate and rhythm, no murmurs / rubs / gallops. No extremity edema. 2+ pedal pulses. No carotid bruits.  Abdomen: no tenderness, no masses palpated. No hepatosplenomegaly. Bowel sounds positive.  Musculoskeletal: no clubbing / cyanosis. No joint deformity upper and lower extremities. Good ROM, no contractures. Normal muscle tone.  Skin: no rashes, lesions, ulcers. No induration Neurologic: Dense L facial droop.  I also question 4/5 strength LUE, LLE when compared to R but patient says they dont feel weaker to her. Psychiatric: Normal judgment and insight. Alert and oriented x 3. Normal mood.    Labs on Admission: I have personally reviewed following labs and imaging studies  CBC: Recent Labs  Lab 02/08/18 1445 02/08/18 1447  WBC 10.5  --   NEUTROABS 7.0  --   HGB 14.1  16.3*  HCT 45.4 48.0*  MCV 82.8  --   PLT 222  --    Basic Metabolic Panel: Recent Labs  Lab 02/08/18 1445 02/08/18 1447  NA 142 143  K 3.7 3.6  CL 107 106  CO2 25  --   GLUCOSE 112* 106*  BUN 9 11  CREATININE 0.85 0.70  CALCIUM 9.5  --    GFR: Estimated Creatinine Clearance: 84.3 mL/min (by C-G formula based on SCr of 0.7 mg/dL). Liver Function Tests: Recent Labs  Lab 02/08/18 1445  AST 30  ALT 23  ALKPHOS 108  BILITOT 0.7  PROT 8.9*  ALBUMIN 4.1   No results for input(s): LIPASE, AMYLASE in the last 168 hours. No results for input(s): AMMONIA in the last 168 hours. Coagulation Profile: Recent Labs  Lab 02/08/18 1445  INR 1.79   Cardiac Enzymes: No results for input(s): CKTOTAL,  CKMB, CKMBINDEX, TROPONINI in the last 168 hours. BNP (last 3 results) No results for input(s): PROBNP in the last 8760 hours. HbA1C: No results for input(s): HGBA1C in the last 72 hours. CBG: Recent Labs  Lab 02/08/18 1438  GLUCAP 99   Lipid Profile: No results for input(s): CHOL, HDL, LDLCALC, TRIG, CHOLHDL, LDLDIRECT in the last 72 hours. Thyroid Function Tests: No results for input(s): TSH, T4TOTAL, FREET4, T3FREE, THYROIDAB in the last 72 hours. Anemia Panel: No results for input(s): VITAMINB12, FOLATE, FERRITIN, TIBC, IRON, RETICCTPCT in the last 72 hours. Urine analysis:    Component Value Date/Time   COLORURINE YELLOW 11/02/2015 1136   APPEARANCEUR CLEAR 11/02/2015 1136   LABSPEC 1.016 11/02/2015 1136   PHURINE 6.0 11/02/2015 1136   GLUCOSEU NEGATIVE 11/02/2015 1136   HGBUR NEGATIVE 11/02/2015 1136   BILIRUBINUR NEGATIVE 11/02/2015 [place] 11/02/2015 1136   PROTEINUR NEGATIVE 11/02/2015 1136   UROBILINOGEN 1 03/22/2015 1301   NITRITE NEGATIVE 11/02/2015 1136   LEUKOCYTESUR SMALL (A) 11/02/2015 1136    Radiological Exams on Admission: Ct Angio Head W Or Wo Contrast  Result Date: 02/08/2018 CLINICAL DATA:  LEFT-sided weakness began earlier  today. EXAM: CT ANGIOGRAPHY HEAD AND NECK TECHNIQUE: Multidetector CT imaging of the head and neck was performed using the standard protocol during bolus administration of intravenous contrast. Multiplanar CT image reconstructions and MIPs were obtained to evaluate the vascular anatomy. Carotid stenosis measurements (when applicable) are obtained utilizing NASCET criteria, using the distal internal carotid diameter as the denominator. CONTRAST:  38mL ISOVUE-370 IOPAMIDOL (ISOVUE-370) INJECTION 76% COMPARISON:  Code stroke CT performed few minutes earlier. Previous MRI 11/02/2015. Previous CTA head neck 11/02/2015. FINDINGS: CTA NECK FINDINGS Aortic arch: Variant branching, LEFT vertebral arises directly from the arch. No proximal stenosis. Right carotid system: Minor atheromatous change at the bifurcation. No evidence of dissection, stenosis, or occlusion. Left carotid system: Atheromatous plaque begins just above the bifurcation, and becomes heavily calcified approximately 1.5 cm above the LICA origin. There is a 50% stenosis based on luminal measurements of 2.0/4.0 proximal/distal at the site of the heaviest calcification. No dissection or flow-limiting stenosis. Vertebral arteries: Codominant.  No focal stenosis. Skeleton: Mild spondylosis. No worrisome osseous lesion. Poor dentition. 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 Other neck: No neck masses. Upper chest: No lung apex lesion or pneumothorax. Review of the MIP images confirms the above findings CTA HEAD FINDINGS Anterior circulation: Nonstenotic calcification of the cavernous carotids. No significant stenosis, proximal occlusion, aneurysm, or vascular malformation. There is irregularity of the M3 branches bilaterally suggesting intracranial atherosclerotic disease. Previous RIGHT distal M2 occlusion has recanalized. Posterior circulation: Diminutive basilar artery is supplied by both vertebrals. No significant  stenosis, proximal occlusion, or aneurysm. BILATERAL fetal PCA. Venous sinuses: Patent. Anatomic variants: Fetal PCA bilaterally. Delayed phase: Not performed. Review of the MIP images confirms the above findings IMPRESSION: No flow-limiting intracranial or extracranial stenosis. No emergent large vessel occlusion. MRI recommended for further evaluation. Electronically Signed   By: Staci Righter M.D.   On: 02/08/2018 15:41   Ct Angio Neck W Or Wo Contrast  Result Date: 02/08/2018 CLINICAL DATA:  LEFT-sided weakness began earlier today. EXAM: CT ANGIOGRAPHY HEAD AND NECK TECHNIQUE: Multidetector CT imaging of the head and neck was performed using the standard protocol during bolus administration of intravenous contrast. Multiplanar CT image reconstructions and MIPs were obtained to evaluate the vascular anatomy. Carotid stenosis measurements (when applicable) are obtained utilizing NASCET criteria, using the  distal internal carotid diameter as the denominator. CONTRAST:  9mL ISOVUE-370 IOPAMIDOL (ISOVUE-370) INJECTION 76% COMPARISON:  Code stroke CT performed few minutes earlier. Previous MRI 11/02/2015. Previous CTA head neck 11/02/2015. FINDINGS: CTA NECK FINDINGS Aortic arch: Variant branching, LEFT vertebral arises directly from the arch. No proximal stenosis. Right carotid system: Minor atheromatous change at the bifurcation. No evidence of dissection, stenosis, or occlusion. Left carotid system: Atheromatous plaque begins just above the bifurcation, and becomes heavily calcified approximately 1.5 cm above the LICA origin. There is a 50% stenosis based on luminal measurements of 2.0/4.0 proximal/distal at the site of the heaviest calcification. No dissection or flow-limiting stenosis. Vertebral arteries: Codominant.  No focal stenosis. Skeleton: Mild spondylosis. No worrisome osseous lesion. Poor dentition. 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 Other neck: No  neck masses. Upper chest: No lung apex lesion or pneumothorax. Review of the MIP images confirms the above findings CTA HEAD FINDINGS Anterior circulation: Nonstenotic calcification of the cavernous carotids. No significant stenosis, proximal occlusion, aneurysm, or vascular malformation. There is irregularity of the M3 branches bilaterally suggesting intracranial atherosclerotic disease. Previous RIGHT distal M2 occlusion has recanalized. Posterior circulation: Diminutive basilar artery is supplied by both vertebrals. No significant stenosis, proximal occlusion, or aneurysm. BILATERAL fetal PCA. Venous sinuses: Patent. Anatomic variants: Fetal PCA bilaterally. Delayed phase: Not performed. Review of the MIP images confirms the above findings IMPRESSION: No flow-limiting intracranial or extracranial stenosis. No emergent large vessel occlusion. MRI recommended for further evaluation. Electronically Signed   By: Staci Righter M.D.   On: 02/08/2018 15:41   Mr Brain Wo Contrast  Result Date: 02/08/2018 CLINICAL DATA:  LEFT facial numbness and weakness. Prior history of strokes. History of atrial fibrillation. History of hypertension. EXAM: MRI HEAD WITHOUT CONTRAST TECHNIQUE: Multiplanar, multiecho pulse sequences of the brain and surrounding structures were obtained without intravenous contrast. COMPARISON:  CT studies earlier in the day.  MR brain 11/04/2015. FINDINGS: Brain: Tubular, almost horizontal, acute RIGHT posterior frontal cortical and subcortical infarct, extending to the centrum semiovale, displays restricted diffusion, corresponding low ADC. See series 3, images 34-36. No other areas of acute infarction are seen. RIGHT inferior frontal hypoattenuation on CT was artifactual. Other areas of indeterminate hypoattenuation on CT, within the RIGHT hemisphere, do not restrict, and represent sequelae of previous RIGHT MCA infarction from 2017. No hemorrhage, mass lesion, hydrocephalus, or extra-axial fluid.  Premature for age atrophy. Extensive T2 and FLAIR hyperintensities in the white matter, consistent with small vessel disease. Reperfusion type chronic hemorrhage within the RIGHT temporal and parietal lobe, sequelae of previous insult. Vascular: Normal flow voids. Skull and upper cervical spine: Normal marrow signal. Spondylosis C4-C5, suspected spinal stenosis. Sinuses/Orbits: No layering fluid.  Negative orbits. Other: None. IMPRESSION: Acute nonhemorrhagic RIGHT MCA territory infarct affects the posterior frontal cortex and subcortical white matter. Atrophy and small vessel disease. Electronically Signed   By: Staci Righter M.D.   On: 02/08/2018 21:00   Ct Head Code Stroke Wo Contrast  Result Date: 02/08/2018 CLINICAL DATA:  Code stroke. LEFT-sided weakness and LEFT-sided facial droop and dizziness. Last seen normal 1338 hours. EXAM: CT HEAD WITHOUT CONTRAST TECHNIQUE: Contiguous axial images were obtained from the base of the skull through the vertex without intravenous contrast. COMPARISON:  MR head 11/04/2015.  CT head 11/03/2015. FINDINGS: Brain: There is evidence for chronic cerebral infarction on the RIGHT, with encephalomalacia, and regional gliosis. Areas of hypoattenuation affecting the RIGHT temporal cortex, and RIGHT frontotemporal periventricular white matter, are indeterminate  for acute versus chronic cerebral ischemia. In the inferior frontal cortex on the RIGHT, there is hypoattenuation concerning for acute infarction. See axial series 3, image 11. Chronic encephalomalacia surrounding the central sulcus on the RIGHT. No hemorrhage, mass lesion, hydrocephalus, or extra-axial fluid. The brain is mildly atrophic for age. White matter disease better delineated on MR. Vascular: Calcification of the cavernous internal carotid arteries consistent with cerebrovascular atherosclerotic disease. No signs of intracranial large vessel occlusion. Skull: Calvarium intact. Sinuses/Orbits: No sinus disease.   Negative orbits. Other: None ASPECTS ([place] Stroke Program Early CT Score) - Ganglionic level infarction (caudate, lentiform nuclei, internal capsule, insula, M1-M3 cortex): 5. M1 cortex abnormal, possibly M2 cortex. - Supraganglionic infarction (M4-M6 cortex): 2. M5 cortex and white matter possibly abnormal. Total score (0-10 with 10 being normal): 7-9. IMPRESSION: 1. Chronic RIGHT hemisphere infarction, with questionable new versus residual infarction of the RIGHT temporal operculum as well as RIGHT frontotemporal region. Hypodensity of the RIGHT inferior frontal lobe, not involved with previous infarction, appears to be a new finding. 2. ASPECTS is 7-9. 3. These results were called by telephone at the time of interpretation on 02/08/2018 at 3:28 pm to Dr. Leonel Ramsay , who verbally acknowledged these results. Electronically Signed   By: Staci Righter M.D.   On: 02/08/2018 15:28    EKG: Independently reviewed.  Assessment/Plan Principal Problem:   Acute ischemic right MCA stroke (HCC) Active Problems:   HTN (hypertension)   Chronic anticoagulation   Chronic atrial fibrillation ([place])    1. Acute ischemic R MCA stroke - 1. Stroke pathway and work up 2. Neuro consult 3. 2d echo 4. Hold Xarelto for now ASA 325 for next 3 days per Dr. Rory Percy 5. Before going back to Xarelto. 2. A.Fib - 1. Holding Xarelto as above per Dr. Johny Chess instructions 2. Continuing Cardizem cardizem for rate control. 3. Will try holding metoprolol, if she goes in to RVR then will need to resume metoprolol. 3. HTN - 1. Will continue cardizem for rate control of A.Fib 2. Try holding metoprolol to allow some permissive HTN.  DVT prophylaxis: ASA / SCDs, and currently therapeutic on Xarelto Code Status: Full Family Communication: No family in room Disposition Plan: Home after admit Consults called: Neuro Admission status: Admit to inpatient   Etta Quill DO [facility] Pager [phone]  If  7AM-7PM, please contact day team taking care of patient [email or web address] Password [facility]  02/08/2018, 9:42 PM

## 2018-02-08 NOTE — ED Notes (Signed)
Patient transported to MRI 

## 2018-02-08 NOTE — ED Notes (Signed)
PureWick external catheter placed.  Pt endorses understanding.

## 2018-02-08 NOTE — ED Notes (Signed)
Pt's CBG 99.  Informed Blanch Media, RN.

## 2018-02-08 NOTE — ED Triage Notes (Signed)
Pt states around 1330 she was on the phone and had slurred speech with drooling. Left sided facial droop noted, states when she stood up she was dizzy. No drifts noted, only subjective weakness to left leg. Air way cleared by PA at 1430. Unsuccessful placement of 18G PIV X 2 RN. Speech does not sound slurred at present. Hx of 2 strokes.

## 2018-02-08 NOTE — ED Notes (Signed)
[place] paged. Patient continuing to have drooling from left side of mouth. Elevated blood pressure reported.

## 2018-02-08 NOTE — Consult Note (Addendum)
Referring Physician: ER Referral for: Code Stroke   Chief Complaint: "speech became slurred"  HPI: [name] is an 52 y.o. female 52 year old female with history of prior stroke, atrial fibrillation currently on Xarelto, brought here by family member for evaluation of strokelike symptoms. Patient reports that she was talking on the phone to a friend around 1230 when the friend said she suddenly couldn't understand her, she was slurring her words. Furthermore, she noticed drooling from the left side as well as tingling sensation to the left side of face.  No complaint of headache, vision changes, confusion, neck pain, chest pain, trouble breathing, abdominal pain or back pain.  Denies arm numbness weakness.  No recent sickness.  She has had strokes in the past without functional residual deficits, but does have remaining left facial droop per family and pt report. She has not missed any doses of Xarelto and in fact took todays dose at 1300. Stat CTH and CTA done. No LVO for IA tx. Baseline mRS 1. Current NIHSS is 1 for chronic left facial droop. Dysarthria has resolved and she is currently holding all secretions, no drool. Per family her left facial droop appears to be at baseline.   Date last known well: 02/08/18 Time last known well: 74  tPA Given: no due to rapidly resolving symptoms and pt took Xarelto at 1300  Past Medical History Past Medical History:  Diagnosis Date  . Clotting disorder ([place])    on xarelto  . Essential hypertension   . Heart murmur    a. 10/2015 Echo: EF 60-65%, no rwma, mild AI/MR, sev dil LA/RA, PASP 12mmHg.  [name] Noncompliance   . NSVT (nonsustained ventricular tachycardia) ([place])    a. 10/2015 during admission for CVA/AF.  [name] Paroxysmal A-fib ([place])    a. CHA2DS2VASC = 4-->Xarelto;  b. 02/2016 Successful DCCV.  [name] Pneumonia 2012 x 2; 2015  . Stroke [facility])    a. 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  . Transient ischemic attack (TIA) 01/2013    Surgical History Past Surgical History:  Procedure Laterality Date  . CARDIOVERSION N/A 02/18/2013   Procedure: CARDIOVERSION;  Surgeon: Sanda Klein, MD;  Location: [place] ENDOSCOPY;  Service: Cardiovascular;  Laterality: N/A;  . CARDIOVERSION N/A 02/23/2013   Procedure: CARDIOVERSION;  Surgeon: Leonie Man, MD;  Location: [place];  Service: Cardiovascular;  Laterality: N/A;  BESIDE CV  . CARDIOVERSION N/A 03/12/2016   Procedure: CARDIOVERSION;  Surgeon: Larey Dresser, MD;  Location: [place];  Service: Cardiovascular;  Laterality: N/A;  . TEE WITHOUT CARDIOVERSION N/A 02/18/2013   Procedure: TRANSESOPHAGEAL ECHOCARDIOGRAM (TEE);  Surgeon: Sanda Klein, MD;  Location: [facility] ENDOSCOPY;  Service: Cardiovascular;  Laterality: N/A;  . TUBAL LIGATION  1992    Family History  Family History  Problem Relation Age of Onset  . Cancer Mother   . Atrial fibrillation Mother   . Breast cancer Mother   . Hypertension Father   . Atrial fibrillation Father   . Peripheral vascular disease Father   . Hypertension Unknown   . Diabetes Unknown   . Heart attack Neg Hx   . Stroke Neg Hx   . Rectal cancer Neg Hx   . Colon cancer Neg Hx   . Esophageal cancer Neg Hx   . Stomach cancer Neg Hx     Social History:   reports that she quit smoking about 4 years ago. Her smoking use included cigarettes.  She quit after 3.00 years of use. She has never used smokeless tobacco. She reports that she drinks alcohol. She reports that she does not use drugs.  Allergies:  Allergies  Allergen Reactions  . Penicillins Hives and Other (See Comments)    Unknown  Has patient had a PCN reaction causing immediate rash, facial/tongue/throat swelling, SOB or lightheadedness with hypotension: No Has patient had a PCN reaction causing severe rash involving mucus membranes or skin necrosis: No Has patient had a PCN reaction that required hospitalization  No Has patient had a PCN reaction occurring within the last 10 years: No If all of the above answers are "NO", then may proceed with Cephalosporin use.    Home Medications:   (Not in a hospital admission)  Hospital Medications   ROS:  History obtained from pt  General ROS: negative for - chills, fatigue, fever, night sweats, weight gain or weight loss Psychological ROS: negative for - behavioral disorder, hallucinations, memory difficulties, mood swings or suicidal ideation Ophthalmic ROS: negative for - blurry vision, double vision, eye pain or loss of vision ENT ROS: negative for - epistaxis, nasal discharge, oral lesions, sore throat, tinnitus or vertigo Allergy and Immunology ROS: negative for - hives or itchy/watery eyes Hematological and Lymphatic ROS: negative for - bleeding problems, bruising or swollen lymph nodes Endocrine ROS: negative for - galactorrhea, hair pattern changes, polydipsia/polyuria or temperature intolerance Respiratory ROS: negative for - cough, hemoptysis, shortness of breath or wheezing Cardiovascular ROS: negative for - chest pain, dyspnea on exertion, edema or irregular heartbeat Gastrointestinal ROS: negative for - abdominal pain, diarrhea, hematemesis, nausea/vomiting or stool incontinence Genito-Urinary ROS: negative for - dysuria, hematuria, incontinence or urinary frequency/urgency Musculoskeletal ROS: negative for - joint swelling or muscular weakness Neurological ROS: as noted in HPI Dermatological ROS: negative for rash and skin lesion changes   Physical Examination: Vitals:   02/08/18 1451 02/08/18 1510 02/08/18 1515 02/08/18 1530  BP: (!) 181/98  118/90 136/82  Pulse: 73 75 63 (!) 113  Resp: 18 11 (!) 27 16  Temp:      TempSrc:      SpO2: 100% 100% 100% 100%    General - no acute distress Heart - Regular rate and rhythm - no murmer appreciated Lungs - Clear to auscultation  Abdomen - Soft - non tender Extremities - Distal pulses  intact - no edema Skin - Warm and dry   Neurologic Examination:  Mental Status: Alert, oriented, thought content appropriate.  Speech fluent without evidence of aphasia. Able to follow 3 step commands without difficulty. No dysarthria  at this time Cranial Nerves: II: Discs not visualized; Visual fields grossly normal, pupils equal, round, reactive to light III,IV, VI: ptosis not present, extra-ocular motions intact bilaterally V,VII: smile asymmetric with chronic left side droop, facial light touch sensation normal bilaterally VIII: hearing normal bilaterally IX,X: gag reflex present XI: bilateral shoulder shrug XII: midline tongue extension Motor: RUE - 5/5    LUE - 5/5   RLE - 5/5    LLE - 5/5 Tone and bulk:normal tone throughout; no atrophy noted Sensory: Light touch intact throughout, bilaterally Deep Tendon Reflexes: 2+ and symmetric throughout Plantars: Right: downgoing   Left: downgoing Cerebellar: normal finger-to-nose and normal heel-to-shin test Gait: deferred at this time.   NIHSS 1a Level of Conscious:0 1b LOC Questions: 0 1c LOC Commands:0  2 Best Gaze: 0 3 Visual: 0 4 Facial Palsy: 1 5a Motor Arm - left:0  5b Motor Arm - Right: 0  6a Motor Leg - Left: 0 6b Motor Leg - Right:0  7 Limb Ataxia: 0 8 Sensory: 0 9 Best Language: 0 10 Dysarthria:0 11 Extinct. and Inattention:0 TOTAL: 1   LABORATORY STUDIES:  Basic Metabolic Panel: Recent Labs  Lab 02/08/18 1445 02/08/18 1447  NA 142 143  K 3.7 3.6  CL 107 106  CO2 25  --   GLUCOSE 112* 106*  BUN 9 11  CREATININE 0.85 0.70  CALCIUM 9.5  --     Liver Function Tests: Recent Labs  Lab 02/08/18 1445  AST 30  ALT 23  ALKPHOS 108  BILITOT 0.7  PROT 8.9*  ALBUMIN 4.1   No results for input(s): LIPASE, AMYLASE in the last 168 hours. No results for input(s): AMMONIA in the last 168 hours.  CBC: Recent Labs  Lab 02/08/18 1445 02/08/18 1447  WBC 10.5  --   NEUTROABS 7.0  --   HGB 14.1  16.3*  HCT 45.4 48.0*  MCV 82.8  --   PLT 222  --     Cardiac Enzymes: No results for input(s): CKTOTAL, CKMB, CKMBINDEX, TROPONINI in the last 168 hours.  BNP: Invalid input(s): POCBNP  CBG: Recent Labs  Lab 02/08/18 1438  GLUCAP 99    Microbiology:   Coagulation Studies: Recent Labs    02/08/18 1445  LABPROT 20.7*  INR 1.79    Urinalysis: No results for input(s): COLORURINE, LABSPEC, PHURINE, GLUCOSEU, HGBUR, BILIRUBINUR, KETONESUR, PROTEINUR, UROBILINOGEN, NITRITE, LEUKOCYTESUR in the last 168 hours.  Invalid input(s): APPERANCEUR  Lipid Panel:     Component Value Date/Time   CHOL 138 03/03/2017 0848   TRIG 78 03/03/2017 0848   HDL 59 03/03/2017 0848   CHOLHDL 2.3 03/03/2017 0848   CHOLHDL 4.1 11/03/2015 0405   VLDL 10 11/03/2015 0405   LDLCALC 63 03/03/2017 0848    HgbA1C:  Lab Results  Component Value Date   HGBA1C 6.2 11/14/2015    Urine Drug Screen:     Component Value Date/Time   LABOPIA NONE DETECTED 11/02/2015 1136   COCAINSCRNUR NONE DETECTED 11/02/2015 1136   LABBENZ NONE DETECTED 11/02/2015 1136   AMPHETMU NONE DETECTED 11/02/2015 1136   THCU NONE DETECTED 11/02/2015 1136   LABBARB NONE DETECTED 11/02/2015 1136     Alcohol Level:  No results for input(s): ETH in the last 168 hours.  Miscellaneous labs:  EKG  EKG   IMAGING: Ct Angio Head W Or Wo Contrast  Result Date: 02/08/2018 CLINICAL DATA:  LEFT-sided weakness began earlier today. EXAM: CT ANGIOGRAPHY HEAD AND NECK TECHNIQUE: Multidetector CT imaging of the head and neck was performed using the standard protocol during bolus administration of intravenous contrast. Multiplanar CT image reconstructions and MIPs were obtained to evaluate the vascular anatomy. Carotid stenosis measurements (when applicable) are obtained utilizing NASCET criteria, using the distal internal carotid diameter as the denominator. CONTRAST:  79mL ISOVUE-370 IOPAMIDOL (ISOVUE-370) INJECTION 76%  COMPARISON:  Code stroke CT performed few minutes earlier. Previous MRI 11/02/2015. Previous CTA head neck 11/02/2015. FINDINGS: CTA NECK FINDINGS Aortic arch: Variant branching, LEFT vertebral arises directly from the arch. No proximal stenosis. Right carotid system: Minor atheromatous change at the bifurcation. No evidence of dissection, stenosis, or occlusion. Left carotid system: Atheromatous plaque begins just above the bifurcation, and becomes heavily calcified approximately 1.5 cm above the LICA origin. There is a 50% stenosis based on luminal measurements of 2.0/4.0 proximal/distal at the site of the heaviest calcification. No dissection or flow-limiting stenosis. Vertebral arteries: Codominant.  No  focal stenosis. Skeleton: Mild spondylosis. No worrisome osseous lesion. Poor dentition. 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 Other neck: No neck masses. Upper chest: No lung apex lesion or pneumothorax. Review of the MIP images confirms the above findings CTA HEAD FINDINGS Anterior circulation: Nonstenotic calcification of the cavernous carotids. No significant stenosis, proximal occlusion, aneurysm, or vascular malformation. There is irregularity of the M3 branches bilaterally suggesting intracranial atherosclerotic disease. Previous RIGHT distal M2 occlusion has recanalized. Posterior circulation: Diminutive basilar artery is supplied by both vertebrals. No significant stenosis, proximal occlusion, or aneurysm. BILATERAL fetal PCA. Venous sinuses: Patent. Anatomic variants: Fetal PCA bilaterally. Delayed phase: Not performed. Review of the MIP images confirms the above findings IMPRESSION: No flow-limiting intracranial or extracranial stenosis. No emergent large vessel occlusion. MRI recommended for further evaluation. Electronically Signed   By: Staci Righter M.D.   On: 02/08/2018 15:41   Ct Angio Neck W Or Wo Contrast  Result Date: 02/08/2018 CLINICAL DATA:   LEFT-sided weakness began earlier today. EXAM: CT ANGIOGRAPHY HEAD AND NECK TECHNIQUE: Multidetector CT imaging of the head and neck was performed using the standard protocol during bolus administration of intravenous contrast. Multiplanar CT image reconstructions and MIPs were obtained to evaluate the vascular anatomy. Carotid stenosis measurements (when applicable) are obtained utilizing NASCET criteria, using the distal internal carotid diameter as the denominator. CONTRAST:  38mL ISOVUE-370 IOPAMIDOL (ISOVUE-370) INJECTION 76% COMPARISON:  Code stroke CT performed few minutes earlier. Previous MRI 11/02/2015. Previous CTA head neck 11/02/2015. FINDINGS: CTA NECK FINDINGS Aortic arch: Variant branching, LEFT vertebral arises directly from the arch. No proximal stenosis. Right carotid system: Minor atheromatous change at the bifurcation. No evidence of dissection, stenosis, or occlusion. Left carotid system: Atheromatous plaque begins just above the bifurcation, and becomes heavily calcified approximately 1.5 cm above the LICA origin. There is a 50% stenosis based on luminal measurements of 2.0/4.0 proximal/distal at the site of the heaviest calcification. No dissection or flow-limiting stenosis. Vertebral arteries: Codominant.  No focal stenosis. Skeleton: Mild spondylosis. No worrisome osseous lesion. Poor dentition. 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 Other neck: No neck masses. Upper chest: No lung apex lesion or pneumothorax. Review of the MIP images confirms the above findings CTA HEAD FINDINGS Anterior circulation: Nonstenotic calcification of the cavernous carotids. No significant stenosis, proximal occlusion, aneurysm, or vascular malformation. There is irregularity of the M3 branches bilaterally suggesting intracranial atherosclerotic disease. Previous RIGHT distal M2 occlusion has recanalized. Posterior circulation: Diminutive basilar artery is supplied by  both vertebrals. No significant stenosis, proximal occlusion, or aneurysm. BILATERAL fetal PCA. Venous sinuses: Patent. Anatomic variants: Fetal PCA bilaterally. Delayed phase: Not performed. Review of the MIP images confirms the above findings IMPRESSION: No flow-limiting intracranial or extracranial stenosis. No emergent large vessel occlusion. MRI recommended for further evaluation. Electronically Signed   By: Staci Righter M.D.   On: 02/08/2018 15:41   Ct Head Code Stroke Wo Contrast  Result Date: 02/08/2018 CLINICAL DATA:  Code stroke. LEFT-sided weakness and LEFT-sided facial droop and dizziness. Last seen normal 1338 hours. EXAM: CT HEAD WITHOUT CONTRAST TECHNIQUE: Contiguous axial images were obtained from the base of the skull through the vertex without intravenous contrast. COMPARISON:  MR head 11/04/2015.  CT head 11/03/2015. FINDINGS: Brain: There is evidence for chronic cerebral infarction on the RIGHT, with encephalomalacia, and regional gliosis. Areas of hypoattenuation affecting the RIGHT temporal cortex, and RIGHT frontotemporal periventricular white matter, are indeterminate for acute versus chronic cerebral ischemia.  In the inferior frontal cortex on the RIGHT, there is hypoattenuation concerning for acute infarction. See axial series 3, image 11. Chronic encephalomalacia surrounding the central sulcus on the RIGHT. No hemorrhage, mass lesion, hydrocephalus, or extra-axial fluid. The brain is mildly atrophic for age. White matter disease better delineated on MR. Vascular: Calcification of the cavernous internal carotid arteries consistent with cerebrovascular atherosclerotic disease. No signs of intracranial large vessel occlusion. Skull: Calvarium intact. Sinuses/Orbits: No sinus disease.  Negative orbits. Other: None ASPECTS ([place] Stroke Program Early CT Score) - Ganglionic level infarction (caudate, lentiform nuclei, internal capsule, insula, M1-M3 cortex): 5. M1 cortex abnormal,  possibly M2 cortex. - Supraganglionic infarction (M4-M6 cortex): 2. M5 cortex and white matter possibly abnormal. Total score (0-10 with 10 being normal): 7-9. IMPRESSION: 1. Chronic RIGHT hemisphere infarction, with questionable new versus residual infarction of the RIGHT temporal operculum as well as RIGHT frontotemporal region. Hypodensity of the RIGHT inferior frontal lobe, not involved with previous infarction, appears to be a new finding. 2. ASPECTS is 7-9. 3. These results were called by telephone at the time of interpretation on 02/08/2018 at 3:28 pm to Dr. Leonel Ramsay , who verbally acknowledged these results. Electronically Signed   By: Staci Righter M.D.   On: 02/08/2018 15:28      Assessment: 52 y.o. female presented with sudden onset dysarthria and worse left facial droop.CTH no new infarct. CTA shows no LVO or stenosis. Stroke Risk Factors - prev stroke, Afib, HTN  # Transient neurologic symptoms with dysarthria and worsening left facial droop- now resolved. ddx stroke, TIA, complicated migraine, decompensated old stroke symptoms. Will MRI to better evaluate # h/o stroke, residual left droop, but functionally resolved. mRS 1 # Afib- continue Xarelto, last dose 1300 per pt # HTN- normotensive goals at this time.   Plan:  MRI brain.   If neg could d/c home w/outpt neuro follow up   Further wk up pending MRI results.   Updated pt/family and ER staff with above plan. Case d/w Dr Leonel Ramsay   Attending Neurologist's note to follow  I have I have seen and evaluated the patient. I have reviewed the above note. 52 yo F with worsening of baseline symptoms in the setting of headache. She is already anti-coagulated. At this point, suspect recrudescence possibly with migraine as opposed to new ischemic event. I would, however, obtain MRI to rule this out.   Roland Rack, MD [facility] [phone]  If 7pm- 7am, please page neurology on call as listed in  [place].

## 2018-02-08 NOTE — ED Notes (Signed)
Kirkpatrick, MD at bedside.  

## 2018-02-08 NOTE — Progress Notes (Signed)
MRI positive for AIS. Admit for stroke w/u. Hold Xarelto for now. Can do ASA 325. Can resume Xarelto in 3-5 days. Final recs after stroke team rounds.  Additional recs from prior consult note: -Admit to hospitalist or observation -Telemetry monitoring -Allow for permissive hypertension for the first 24-48h - only treat PRN if SBP >220 mmHg. Blood pressures can be gradually normalized to SBP<140 upon discharge. -Echocardiogram -HgbA1c, fasting lipid panel -Frequent neuro checks -Prophylactic therapy-Antiplatelet med: Aspirin - dose 325mg  PO or 300mg  PR. Hold xarelto for now. -Atorvastatin 80 mg PO daily -Risk factor modification -I discussed the importance of exercise as well as smoking/alcohol/illicit drug use cessation. -PT consult, OT consult, Speech consult  Please page stroke NP/PA/MD (listed on [facility]ION)  from 8am-4 pm as this patient will be followed by the stroke team at this point.  Plan relayed to Admitting Dr. Alcario Drought via page. Please call with questions  -- Amie Portland, MD [facility] Pager: [phone] If 7pm to 7am, please call on call as listed on [facility].

## 2018-02-08 NOTE — ED Notes (Signed)
Received return call from [facility]. No new orders received.

## 2018-02-08 NOTE — ED Provider Notes (Signed)
[facility] EMERGENCY DEPARTMENT Provider Note   CSN: [id number] Arrival date & time: 02/08/18  1418     History   Chief Complaint No chief complaint on file.   HPI [name] is a 52 y.o. female.  HPI   52 year old female with history of prior stroke, atrial fibrillation currently on Xarelto, brought here by family member for evaluation of strokelike symptoms.  Patient report approximately an hour and 40 minutes ago, as she was talking to a friend, her speech was slurred.  Furthermore, she noticed drooling from the left side as well as tingling sensation to the left side of face.  No complaint of headache, vision changes, confusion, neck pain, chest pain, trouble breathing, abdominal pain or back pain.  Denies arm numbness weakness.  No recent sickness.  She mentioned having 2 prior strokes in the past without residual deficits.   Past Medical History:  Diagnosis Date  . Clotting disorder ([place])    on xarelto  . Essential hypertension   . Heart murmur    a. 10/2015 Echo: EF 60-65%, no rwma, mild AI/MR, sev dil LA/RA, PASP 65mmHg.  [name] Noncompliance   . NSVT (nonsustained ventricular tachycardia) ([place])    a. 10/2015 during admission for CVA/AF.  [name] Paroxysmal A-fib ([place])    a. CHA2DS2VASC = 4-->Xarelto;  b. 02/2016 Successful DCCV.  [name] Pneumonia 2012 x 2; 2015  . Stroke [facility])    a. 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  . Transient ischemic attack (TIA) 01/2013    Patient Active Problem List   Diagnosis Date Noted  . Headache 01/29/2017  . Refractive errors 04/15/2016  . Medical non-compliance 11/06/2015  . Hyperlipidemia LDL goal <70 11/06/2015  . 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 NSVT (nonsustained ventricular tachycardia) ([place]) 11/06/2015  . History of CVA (cerebrovascular accident)   . 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 Chronic atrial  fibrillation ([place]) 04/12/2014  . Anticoagulation adequate, Xarelto started 04/12/2014  . Pap smear for cervical cancer screening 03/28/2014  . Cigarette smoker 06/15/2013  . Ventral hernia 06/15/2013  . Hx-TIA (transient ischemic attack) 02/05/13 02/15/2013  . HTN (hypertension) 02/15/2013    Past Surgical History:  Procedure Laterality Date  . CARDIOVERSION N/A 02/18/2013   Procedure: CARDIOVERSION;  Surgeon: Sanda Klein, MD;  Location: [facility] ENDOSCOPY;  Service: Cardiovascular;  Laterality: N/A;  . CARDIOVERSION N/A 02/23/2013   Procedure: CARDIOVERSION;  Surgeon: Leonie Man, MD;  Location: [facility];  Service: Cardiovascular;  Laterality: N/A;  BESIDE CV  . CARDIOVERSION N/A 03/12/2016   Procedure: CARDIOVERSION;  Surgeon: Larey Dresser, MD;  Location: [place];  Service: Cardiovascular;  Laterality: N/A;  . TEE WITHOUT CARDIOVERSION N/A 02/18/2013   Procedure: TRANSESOPHAGEAL ECHOCARDIOGRAM (TEE);  Surgeon: Sanda Klein, MD;  Location: [facility] ENDOSCOPY;  Service: Cardiovascular;  Laterality: N/A;  . TUBAL LIGATION  1992     OB History   None      Home Medications    Prior to Admission medications   Medication Sig Start Date End Date Taking? Authorizing Provider  atorvastatin (LIPITOR) 20 MG tablet TAKE 1 TABLET BY MOUTH DAILY AT 6 PM. 08/10/17   Newlin, Enobong, MD  butalbital-acetaminophen-caffeine (FIORICET, ESGIC) 50-325-40 MG tablet TAKE 1 TABLET BY MOUTH EVERY 12 HOURS AS NEEDED FOR HEADACHE 11/12/17   [name], MD  diltiazem (CARDIZEM CD) 300 MG 24 hr capsule Take 1 capsule (300 mg total) by mouth  daily. 08/10/17 09/09/17  [name], MD  metoprolol tartrate (LOPRESSOR) 25 MG tablet Take 1 tablet (25 mg total) by mouth 2 (two) times daily. 08/10/17   [name], MD  rivaroxaban (XARELTO) 20 MG TABS tablet Take 1 tablet (20 mg total) by mouth daily with supper. 08/10/17   [name], MD    Family History Family History  Problem Relation Age of  Onset  . Cancer Mother   . Atrial fibrillation Mother   . Breast cancer Mother   . Hypertension Father   . Atrial fibrillation Father   . Peripheral vascular disease Father   . Hypertension Unknown   . Diabetes Unknown   . Heart attack Neg Hx   . Stroke Neg Hx   . Rectal cancer Neg Hx   . Colon cancer Neg Hx   . Esophageal cancer Neg Hx   . Stomach cancer Neg Hx     Social History Social History   Tobacco Use  . Smoking status: Former Smoker    Years: 3.00    Types: Cigarettes    Last attempt to quit: 03/15/2013    Years since quitting: 4.9  . Smokeless tobacco: Never Used  Substance Use Topics  . Alcohol use: Yes    Comment: drinks on the weekends, 2 drinks and 2 beers  . Drug use: No     Allergies   Penicillins   Review of Systems Review of Systems  All other systems reviewed and are negative.    Physical Exam Updated Vital Signs BP (!) 184/121 (BP Location: Left Arm)   Pulse (!) 116   Temp 98.3 F (36.8 C) (Oral)   Resp 20   SpO2 100%   Physical Exam  Constitutional: She is oriented to person, place, and time. She appears well-developed and well-nourished. No distress.  HENT:  Head: Atraumatic.  Eyes: Pupils are equal, round, and reactive to light. Conjunctivae and EOM are normal.  Neck: Normal range of motion. Neck supple.  Cardiovascular:  Tachycardia without murmur rubs or gallop  Pulmonary/Chest: Effort normal and breath sounds normal.  Abdominal: Soft. Bowel sounds are normal.  Musculoskeletal: Normal range of motion.  Neurological: She is alert and oriented to person, place, and time.  Neurologic exam:  Speech clear, pupils equal round reactive to light, extraocular movements intact  Normal peripheral visual fields L facial droop Follows commands, moves all extremities x4, normal strength to bilateral upper and lower extremities at all major muscle groups including grip Sensation normal to light touch  Coordination intact, no limb ataxia,  finger-nose-finger normal Rapid alternating movements normal No pronator drift Gait normal   Skin: No rash noted.  Psychiatric: She has a normal mood and affect.  Nursing note and vitals reviewed.    ED Treatments / Results  Labs (all labs ordered are listed, but only abnormal results are displayed) Labs Reviewed  PROTIME-INR - Abnormal; Notable for the following components:      Result Value   Prothrombin Time 20.7 (*)    All other components within normal limits  CBC - Abnormal; Notable for the following components:   RBC 5.48 (*)    MCH 25.7 (*)    All other components within normal limits  COMPREHENSIVE METABOLIC PANEL - Abnormal; Notable for the following components:   Glucose, Bld 112 (*)    Total Protein 8.9 (*)    All other components within normal limits  I-STAT CHEM 8, ED - Abnormal; Notable for the following components:   Glucose,  Bld 106 (*)    Hemoglobin 16.3 (*)    HCT 48.0 (*)    All other components within normal limits  APTT  DIFFERENTIAL  I-STAT TROPONIN, ED  CBG MONITORING, ED  I-STAT BETA HCG BLOOD, ED ([facility], [facility], [facility] ONLY)    EKG None  ED ECG REPORT   Date: 02/08/2018  Rate: 125  Rhythm: atrial fibrillation  QRS Axis: left  Intervals: QT prolonged  ST/T Wave abnormalities: nonspecific ST changes  Conduction Disutrbances:none  Narrative Interpretation:   Old EKG Reviewed: unchanged  I have personally reviewed the EKG tracing and agree with the computerized printout as noted.   Radiology Ct Angio Head W Or Wo Contrast  Result Date: 02/08/2018 CLINICAL DATA:  LEFT-sided weakness began earlier today. EXAM: CT ANGIOGRAPHY HEAD AND NECK TECHNIQUE: Multidetector CT imaging of the head and neck was performed using the standard protocol during bolus administration of intravenous contrast. Multiplanar CT image reconstructions and MIPs were obtained to evaluate the vascular anatomy. Carotid stenosis measurements (when applicable) are obtained utilizing  NASCET criteria, using the distal internal carotid diameter as the denominator. CONTRAST:  61mL ISOVUE-370 IOPAMIDOL (ISOVUE-370) INJECTION 76% COMPARISON:  Code stroke CT performed few minutes earlier. Previous MRI 11/02/2015. Previous CTA head neck 11/02/2015. FINDINGS: CTA NECK FINDINGS Aortic arch: Variant branching, LEFT vertebral arises directly from the arch. No proximal stenosis. Right carotid system: Minor atheromatous change at the bifurcation. No evidence of dissection, stenosis, or occlusion. Left carotid system: Atheromatous plaque begins just above the bifurcation, and becomes heavily calcified approximately 1.5 cm above the LICA origin. There is a 50% stenosis based on luminal measurements of 2.0/4.0 proximal/distal at the site of the heaviest calcification. No dissection or flow-limiting stenosis. Vertebral arteries: Codominant.  No focal stenosis. Skeleton: Mild spondylosis. No worrisome osseous lesion. Poor dentition. 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 Other neck: No neck masses. Upper chest: No lung apex lesion or pneumothorax. Review of the MIP images confirms the above findings CTA HEAD FINDINGS Anterior circulation: Nonstenotic calcification of the cavernous carotids. No significant stenosis, proximal occlusion, aneurysm, or vascular malformation. There is irregularity of the M3 branches bilaterally suggesting intracranial atherosclerotic disease. Previous RIGHT distal M2 occlusion has recanalized. Posterior circulation: Diminutive basilar artery is supplied by both vertebrals. No significant stenosis, proximal occlusion, or aneurysm. BILATERAL fetal PCA. Venous sinuses: Patent. Anatomic variants: Fetal PCA bilaterally. Delayed phase: Not performed. Review of the MIP images confirms the above findings IMPRESSION: No flow-limiting intracranial or extracranial stenosis. No emergent large vessel occlusion. MRI recommended for further evaluation.  Electronically Signed   By: Staci Righter M.D.   On: 02/08/2018 15:41   Ct Angio Neck W Or Wo Contrast  Result Date: 02/08/2018 CLINICAL DATA:  LEFT-sided weakness began earlier today. EXAM: CT ANGIOGRAPHY HEAD AND NECK TECHNIQUE: Multidetector CT imaging of the head and neck was performed using the standard protocol during bolus administration of intravenous contrast. Multiplanar CT image reconstructions and MIPs were obtained to evaluate the vascular anatomy. Carotid stenosis measurements (when applicable) are obtained utilizing NASCET criteria, using the distal internal carotid diameter as the denominator. CONTRAST:  17mL ISOVUE-370 IOPAMIDOL (ISOVUE-370) INJECTION 76% COMPARISON:  Code stroke CT performed few minutes earlier. Previous MRI 11/02/2015. Previous CTA head neck 11/02/2015. FINDINGS: CTA NECK FINDINGS Aortic arch: Variant branching, LEFT vertebral arises directly from the arch. No proximal stenosis. Right carotid system: Minor atheromatous change at the bifurcation. No evidence of dissection, stenosis, or occlusion. Left carotid system: Atheromatous plaque begins  just above the bifurcation, and becomes heavily calcified approximately 1.5 cm above the LICA origin. There is a 50% stenosis based on luminal measurements of 2.0/4.0 proximal/distal at the site of the heaviest calcification. No dissection or flow-limiting stenosis. Vertebral arteries: Codominant.  No focal stenosis. Skeleton: Mild spondylosis. No worrisome osseous lesion. Poor dentition. 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 Other neck: No neck masses. Upper chest: No lung apex lesion or pneumothorax. Review of the MIP images confirms the above findings CTA HEAD FINDINGS Anterior circulation: Nonstenotic calcification of the cavernous carotids. No significant stenosis, proximal occlusion, aneurysm, or vascular malformation. There is irregularity of the M3 branches bilaterally suggesting  intracranial atherosclerotic disease. Previous RIGHT distal M2 occlusion has recanalized. Posterior circulation: Diminutive basilar artery is supplied by both vertebrals. No significant stenosis, proximal occlusion, or aneurysm. BILATERAL fetal PCA. Venous sinuses: Patent. Anatomic variants: Fetal PCA bilaterally. Delayed phase: Not performed. Review of the MIP images confirms the above findings IMPRESSION: No flow-limiting intracranial or extracranial stenosis. No emergent large vessel occlusion. MRI recommended for further evaluation. Electronically Signed   By: Staci Righter M.D.   On: 02/08/2018 15:41   Ct Head Code Stroke Wo Contrast  Result Date: 02/08/2018 CLINICAL DATA:  Code stroke. LEFT-sided weakness and LEFT-sided facial droop and dizziness. Last seen normal 1338 hours. EXAM: CT HEAD WITHOUT CONTRAST TECHNIQUE: Contiguous axial images were obtained from the base of the skull through the vertex without intravenous contrast. COMPARISON:  MR head 11/04/2015.  CT head 11/03/2015. FINDINGS: Brain: There is evidence for chronic cerebral infarction on the RIGHT, with encephalomalacia, and regional gliosis. Areas of hypoattenuation affecting the RIGHT temporal cortex, and RIGHT frontotemporal periventricular white matter, are indeterminate for acute versus chronic cerebral ischemia. In the inferior frontal cortex on the RIGHT, there is hypoattenuation concerning for acute infarction. See axial series 3, image 11. Chronic encephalomalacia surrounding the central sulcus on the RIGHT. No hemorrhage, mass lesion, hydrocephalus, or extra-axial fluid. The brain is mildly atrophic for age. White matter disease better delineated on MR. Vascular: Calcification of the cavernous internal carotid arteries consistent with cerebrovascular atherosclerotic disease. No signs of intracranial large vessel occlusion. Skull: Calvarium intact. Sinuses/Orbits: No sinus disease.  Negative orbits. Other: None ASPECTS ([place] Stroke  Program Early CT Score) - Ganglionic level infarction (caudate, lentiform nuclei, internal capsule, insula, M1-M3 cortex): 5. M1 cortex abnormal, possibly M2 cortex. - Supraganglionic infarction (M4-M6 cortex): 2. M5 cortex and white matter possibly abnormal. Total score (0-10 with 10 being normal): 7-9. IMPRESSION: 1. Chronic RIGHT hemisphere infarction, with questionable new versus residual infarction of the RIGHT temporal operculum as well as RIGHT frontotemporal region. Hypodensity of the RIGHT inferior frontal lobe, not involved with previous infarction, appears to be a new finding. 2. ASPECTS is 7-9. 3. These results were called by telephone at the time of interpretation on 02/08/2018 at 3:28 pm to Dr. Leonel Ramsay , who verbally acknowledged these results. Electronically Signed   By: Staci Righter M.D.   On: 02/08/2018 15:28    Procedures Procedures (including critical care time)  Medications Ordered in ED Medications  iopamidol (ISOVUE-370) 76 % injection 50 mL (50 mLs Intravenous Contrast Given 02/08/18 1508)     Initial Impression / Assessment and Plan / ED Course  I have reviewed the triage vital signs and the nursing notes.  Pertinent labs & imaging results that were available during my care of the patient were reviewed by me and considered in my medical decision making (see chart  for details).     BP 118/90   Pulse 63   Temp 98.3 F (36.8 C) (Oral)   Resp (!) 27   SpO2 100%    Final Clinical Impressions(s) / ED Diagnoses   Final diagnoses:  Facial droop    ED Discharge Orders    None     2:57 PM Patient with history of prior stroke without residual deficit here with acute onset of left-sided facial droop started approximately 2 hours ago.  She has no arm drift, no visual field cut, no aphasia no neglect.  Strength normal to all 4 extremities.  She is alert and oriented x4.  Code stroke activated.  3:45 PM Neurologist Dr. Leonel Ramsay have seen and evaluated pt. He  felt this could be a complicated migraine without headache component causing worsening of old facial droop. An initial head and neck CTA obtained demonstrating no flow-limiting intracranial or extracranial stenosis and no emergent large vessel occlusion.  Dr. Leonel Ramsay recommend brain MRI. If negative, pt can be discharge home with outpt f/u as she is currently on Plavix already.  Pt currently pain free, normal gait.  Care discussed with Dr. Jeneen Rinks who will f/u with brain MRI result and dispo as appropriate.   Pt does have hx of atrial fibrillation.  Currently rate control.    CRITICAL CARE Performed by: Domenic Moras Total critical care time: 30 minutes Critical care time was exclusive of separately billable procedures and treating other patients. Critical care was necessary to treat or prevent imminent or life-threatening deterioration. Critical care was time spent personally by me on the following activities: development of treatment plan with patient and/or surrogate as well as nursing, discussions with consultants, evaluation of patient's response to treatment, examination of patient, obtaining history from patient or surrogate, ordering and performing treatments and interventions, ordering and review of laboratory studies, ordering and review of radiographic studies, pulse oximetry and re-evaluation of patient's condition.    Domenic Moras, PA-C 02/08/18 1553    Tanna Furry, MD 02/08/18 2126

## 2018-02-08 NOTE — ED Notes (Signed)
[facility] stated that patient was not a candidate for IR.

## 2018-02-09 ENCOUNTER — Inpatient Hospital Stay (HOSPITAL_COMMUNITY): Payer: BC Managed Care – PPO

## 2018-02-09 ENCOUNTER — Other Ambulatory Visit: Payer: Self-pay

## 2018-02-09 ENCOUNTER — Encounter (HOSPITAL_COMMUNITY): Payer: Self-pay | Admitting: General Practice

## 2018-02-09 ENCOUNTER — Ambulatory Visit: Payer: BC Managed Care – PPO

## 2018-02-09 DIAGNOSIS — I361 Nonrheumatic tricuspid (valve) insufficiency: Secondary | ICD-10-CM

## 2018-02-09 DIAGNOSIS — I63511 Cerebral infarction due to unspecified occlusion or stenosis of right middle cerebral artery: Secondary | ICD-10-CM

## 2018-02-09 LAB — HEMOGLOBIN A1C
Hgb A1c MFr Bld: 6.1 % — ABNORMAL HIGH (ref 4.8–5.6)
Mean Plasma Glucose: 128.37 mg/dL

## 2018-02-09 LAB — ECHOCARDIOGRAM COMPLETE
HEIGHTINCHES: 63.5 in
WEIGHTICAEL: 2962.98 [oz_av]

## 2018-02-09 LAB — LIPID PANEL
CHOL/HDL RATIO: 3.1 ratio
Cholesterol: 175 mg/dL (ref 0–200)
HDL: 56 mg/dL (ref >40)
LDL CALC: 102 mg/dL — AB (ref 0–99)
Triglycerides: 84 mg/dL (ref <150)
VLDL: 17 mg/dL (ref 0–40)

## 2018-02-09 MED ORDER — METOPROLOL TARTRATE 12.5 MG HALF TABLET
12.5000 mg | ORAL_TABLET | Freq: Two times a day (BID) | ORAL | Status: DC
  Administered 2018-02-09 – 2018-02-10 (×3): 12.5 mg via ORAL
  Filled 2018-02-09 (×3): qty 1

## 2018-02-09 MED ORDER — RIVAROXABAN 20 MG PO TABS
20.0000 mg | ORAL_TABLET | Freq: Every day | ORAL | Status: DC
  Administered 2018-02-09: 20 mg via ORAL
  Filled 2018-02-09: qty 1

## 2018-02-09 MED ORDER — ATORVASTATIN CALCIUM 80 MG PO TABS
80.0000 mg | ORAL_TABLET | Freq: Every day | ORAL | Status: DC
  Administered 2018-02-09: 80 mg via ORAL
  Filled 2018-02-09: qty 1

## 2018-02-09 NOTE — Evaluation (Signed)
Occupational Therapy Evaluation Patient Details Name: [name] MRN: [id number] DOB: [birth year] Today's Date: 02/09/2018    History of Present Illness 52 y.o. female with medical history significant of A.Fib, prior embolic CVA, on Chronic Xarelto who presented with slurred speech and was found to have R MCA stroke on imaging   Clinical Impression   Patient evaluated by Occupational Therapy with no further acute OT needs identified. All education has been completed and the patient has no further questions. Pt demonstrates very high level cognitive deficits - deficits with executive function and alternating and divided attention (mild).  GIven her previous h/o CVA, unsure if this is baseline or new.   However, pt demonstrates good understanding of potential limitations, and is able to verbalize ways in which she can modify work demands.  At this point, no further OT needs identified as all education completed.   See below for any follow-up Occupational Therapy or equipment needs. OT is signing off. Thank you for this referral.      Follow Up Recommendations  No OT follow up    Equipment Recommendations  None recommended by OT    Recommendations for Other Services       Precautions / Restrictions        Mobility Bed Mobility Overal bed mobility: Independent             General bed mobility comments: no difficulty performing  Transfers Overall transfer level: Independent               General transfer comment: no difficulty    Balance Overall balance assessment: Mild deficits observed, not formally tested                           High level balance activites: Side stepping;Backward walking;Direction changes;Turns;Sudden stops;Head turns High Level Balance Comments: no difficulty, no overt LOB, modest speed differential but overll steady with ambulation           ADL either performed or assessed with clinical judgement   ADL Overall ADL's :  Modified independent                                             Vision Baseline Vision/History: No visual deficits Patient Visual Report: No change from baseline Vision Assessment?: Yes Eye Alignment: Within Functional Limits Ocular Range of Motion: Within Functional Limits Alignment/Gaze Preference: Within Defined Limits Tracking/Visual Pursuits: Able to track stimulus in all quads without difficulty Saccades: Decreased speed of saccadic movement;Undershoots Convergence: Within functional limits     Perception Perception Perception Tested?: Yes   Praxis Praxis Praxis tested?: Within functional limits    Pertinent Vitals/Pain Pain Assessment: Faces Pain Score: 2  Faces Pain Scale: No hurt Pain Location: head Pain Descriptors / Indicators: Headache Pain Intervention(s): Monitored during session     Hand Dominance Right   Extremity/Trunk Assessment Upper Extremity Assessment Upper Extremity Assessment: LUE deficits/detail LUE Deficits / Details: elbow 4/5  LUE Coordination: WNL   Lower Extremity Assessment Lower Extremity Assessment: Defer to PT evaluation       Communication Communication Communication: No difficulties   Cognition Arousal/Alertness: Awake/alert Behavior During Therapy: WFL for tasks assessed/performed Overall Cognitive Status: Impaired/Different from baseline  General Comments: Pt demonstrates minimal difficulty with serial subtraction of 2's from 100 while ambulating and while turning object in Lt hand.   Pt aware of deficits, and unsure how off of baseline this is.  She is able to describe her job responsibilities and her day in detail, and reports a good plan for return to work.  Did discuss with her possible fatigue, as well as difficulty with executive functions - alternating and dividing her attention.  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        OT Problem List: Decreased strength;Decreased cognition      OT Treatment/Interventions:      OT Goals(Current goals can be found in the care plan section) Acute Rehab OT Goals Patient Stated Goal: to go back to work  OT Goal Formulation: All assessment and education complete, DC therapy  OT Frequency:     Barriers to D/C:            Co-evaluation              AM-PAC PT "6 Clicks" Daily Activity     Outcome Measure Help from another person eating meals?: None Help from another person taking care of personal grooming?: None Help from another person toileting, which includes using toliet, bedpan, or urinal?: None Help from another person bathing (including washing, rinsing, drying)?: None Help from another person to put on and taking off regular upper body clothing?: None Help from another person to put on and taking off regular lower body clothing?: None 6 Click Score: 24   End of Session Nurse Communication: Mobility status  Activity Tolerance: Patient tolerated treatment well Patient left: in bed;with call bell/phone within reach;with family/visitor present  OT Visit Diagnosis: Cognitive communication deficit (R41.841) Symptoms and signs involving cognitive functions: Cerebral infarction                Time: 1240-1311 OT Time Calculation (min): 31 min Charges:  OT General  Charges $OT Visit: 1 Visit OT Evaluation $OT Eval Low Complexity: 1 Low G-Codes:     [facility], OTR/L [phone]   Lucille Passy M 02/09/2018, 1:49 PM

## 2018-02-09 NOTE — Progress Notes (Addendum)
STROKE TEAM PROGRESS NOTE  HPI: ( Dr Leonel Ramsay ) [name] is an 52 y.o. female 52 year old female with history of prior stroke, atrial fibrillation currently on Xarelto, brought here by family member for evaluation of strokelike symptoms. Patient reports that she was talking on the phone to a friend around 1230 when the friend said she suddenly couldn't understand her, she was slurring her words. Furthermore, she noticed drooling from the left side as well as tingling sensation to the left side of face. No complaint of headache, vision changes, confusion, neck pain, chest pain, trouble breathing, abdominal pain or back pain. Denies arm numbness weakness. No recent sickness. She has had strokes in the past without functional residual deficits, but does have remaining left facial droop per family and pt report. She has not missed any doses of Xarelto and in fact took todays dose at 1300. Stat CTH and CTA done. No LVO for IA tx. Baseline mRS 1. Current NIHSS is 1 for chronic left facial droop. Dysarthria has resolved and she is currently holding all secretions, no drool. Per family her left facial droop appears to be at baseline.   Date last known well: 02/08/18 Time last known well: 1230  tPA Given: no due to rapidly resolving symptoms and pt took Xarelto at [place] Pt admits to missing 4-5 days of Xarelto d/t financial issues. States she will not miss again. Discussed importance of taking meds at the same time each day, which she verbalized understanding of, and has a new routine planned for her meds at d/c. No new neuro symptoms. 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   Vitals:   02/08/18 2305 02/09/18 0059 02/09/18 0323 02/09/18 0823  BP: (!) 152/109 (!) 160/97 (!) 148/105 (!) 152/111  Pulse: (!) 117  65 64  Resp: 18  18 18   Temp: 98.4 F (36.9 C)  98.7 F (37.1 C) 98.4 F (36.9 C)  TempSrc: Oral  Oral Oral  SpO2: 100% 97% 100% 99%  Weight: 84 kg  (185 lb 3 oz)     Height: 5' 3.5" (1.613 m)       CBC:  Recent Labs  Lab 02/08/18 1445 02/08/18 1447  WBC 10.5  --   NEUTROABS 7.0  --   HGB 14.1 16.3*  HCT 45.4 48.0*  MCV 82.8  --   PLT 222  --     Basic Metabolic Panel:  Recent Labs  Lab 02/08/18 1445 02/08/18 1447  NA 142 143  K 3.7 3.6  CL 107 106  CO2 25  --   GLUCOSE 112* 106*  BUN 9 11  CREATININE 0.85 0.70  CALCIUM 9.5  --    Lipid Panel:     Component Value Date/Time   CHOL 175 02/09/2018 0531   CHOL 138 03/03/2017 0848   TRIG 84 02/09/2018 0531   HDL 56 02/09/2018 0531   HDL 59 03/03/2017 0848   CHOLHDL 3.1 02/09/2018 0531   VLDL 17 02/09/2018 0531   LDLCALC 102 (H) 02/09/2018 0531   LDLCALC 63 03/03/2017 0848   HgbA1c:  Lab Results  Component Value Date   HGBA1C 6.1 (H) 02/09/2018   Urine Drug Screen:     Component Value Date/Time   LABOPIA NONE DETECTED 11/02/2015 1136   COCAINSCRNUR NONE DETECTED 11/02/2015 1136   LABBENZ NONE DETECTED 11/02/2015 1136   AMPHETMU NONE DETECTED 11/02/2015 1136   THCU NONE DETECTED 11/02/2015 1136   LABBARB NONE DETECTED  11/02/2015 1136    Alcohol Level     Component Value Date/Time   ETH <5 11/02/2015 1114    IMAGING Ct Angio Head W Or Wo Contrast  Result Date: 02/08/2018 CLINICAL DATA:  LEFT-sided weakness began earlier today. EXAM: CT ANGIOGRAPHY HEAD AND NECK TECHNIQUE: Multidetector CT imaging of the head and neck was performed using the standard protocol during bolus administration of intravenous contrast. Multiplanar CT image reconstructions and MIPs were obtained to evaluate the vascular anatomy. Carotid stenosis measurements (when applicable) are obtained utilizing NASCET criteria, using the distal internal carotid diameter as the denominator. CONTRAST:  56mL ISOVUE-370 IOPAMIDOL (ISOVUE-370) INJECTION 76% COMPARISON:  Code stroke CT performed few minutes earlier. Previous MRI 11/02/2015. Previous CTA head neck 11/02/2015. FINDINGS: CTA NECK  FINDINGS Aortic arch: Variant branching, LEFT vertebral arises directly from the arch. No proximal stenosis. Right carotid system: Minor atheromatous change at the bifurcation. No evidence of dissection, stenosis, or occlusion. Left carotid system: Atheromatous plaque begins just above the bifurcation, and becomes heavily calcified approximately 1.5 cm above the LICA origin. There is a 50% stenosis based on luminal measurements of 2.0/4.0 proximal/distal at the site of the heaviest calcification. No dissection or flow-limiting stenosis. Vertebral arteries: Codominant.  No focal stenosis. Skeleton: Mild spondylosis. No worrisome osseous lesion. Poor dentition. 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 Other neck: No neck masses. Upper chest: No lung apex lesion or pneumothorax. Review of the MIP images confirms the above findings CTA HEAD FINDINGS Anterior circulation: Nonstenotic calcification of the cavernous carotids. No significant stenosis, proximal occlusion, aneurysm, or vascular malformation. There is irregularity of the M3 branches bilaterally suggesting intracranial atherosclerotic disease. Previous RIGHT distal M2 occlusion has recanalized. Posterior circulation: Diminutive basilar artery is supplied by both vertebrals. No significant stenosis, proximal occlusion, or aneurysm. BILATERAL fetal PCA. Venous sinuses: Patent. Anatomic variants: Fetal PCA bilaterally. Delayed phase: Not performed. Review of the MIP images confirms the above findings IMPRESSION: No flow-limiting intracranial or extracranial stenosis. No emergent large vessel occlusion. MRI recommended for further evaluation. Electronically Signed   By: Staci Righter M.D.   On: 02/08/2018 15:41   Dg Chest 2 View  Result Date: 02/08/2018 CLINICAL DATA:  Left facial droop, slurred speech, and drooling noted at 1330 hours today. Dizziness. EXAM: CHEST - 2 VIEW COMPARISON:  10/05/2015 FINDINGS: The heart  size and mediastinal contours are within normal limits. Both lungs are clear. The visualized skeletal structures are unremarkable. IMPRESSION: No active cardiopulmonary disease. Electronically Signed   By: Lucienne Capers M.D.   On: 02/08/2018 22:19   Ct Angio Neck W Or Wo Contrast  Result Date: 02/08/2018 CLINICAL DATA:  LEFT-sided weakness began earlier today. EXAM: CT ANGIOGRAPHY HEAD AND NECK TECHNIQUE: Multidetector CT imaging of the head and neck was performed using the standard protocol during bolus administration of intravenous contrast. Multiplanar CT image reconstructions and MIPs were obtained to evaluate the vascular anatomy. Carotid stenosis measurements (when applicable) are obtained utilizing NASCET criteria, using the distal internal carotid diameter as the denominator. CONTRAST:  70mL ISOVUE-370 IOPAMIDOL (ISOVUE-370) INJECTION 76% COMPARISON:  Code stroke CT performed few minutes earlier. Previous MRI 11/02/2015. Previous CTA head neck 11/02/2015. FINDINGS: CTA NECK FINDINGS Aortic arch: Variant branching, LEFT vertebral arises directly from the arch. No proximal stenosis. Right carotid system: Minor atheromatous change at the bifurcation. No evidence of dissection, stenosis, or occlusion. Left carotid system: Atheromatous plaque begins just above the bifurcation, and becomes heavily calcified approximately 1.5 cm above the LICA  origin. There is a 50% stenosis based on luminal measurements of 2.0/4.0 proximal/distal at the site of the heaviest calcification. No dissection or flow-limiting stenosis. Vertebral arteries: Codominant.  No focal stenosis. Skeleton: Mild spondylosis. No worrisome osseous lesion. Poor dentition. 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 Other neck: No neck masses. Upper chest: No lung apex lesion or pneumothorax. Review of the MIP images confirms the above findings CTA HEAD FINDINGS Anterior circulation: Nonstenotic  calcification of the cavernous carotids. No significant stenosis, proximal occlusion, aneurysm, or vascular malformation. There is irregularity of the M3 branches bilaterally suggesting intracranial atherosclerotic disease. Previous RIGHT distal M2 occlusion has recanalized. Posterior circulation: Diminutive basilar artery is supplied by both vertebrals. No significant stenosis, proximal occlusion, or aneurysm. BILATERAL fetal PCA. Venous sinuses: Patent. Anatomic variants: Fetal PCA bilaterally. Delayed phase: Not performed. Review of the MIP images confirms the above findings IMPRESSION: No flow-limiting intracranial or extracranial stenosis. No emergent large vessel occlusion. MRI recommended for further evaluation. Electronically Signed   By: Staci Righter M.D.   On: 02/08/2018 15:41   Mr Brain Wo Contrast  Result Date: 02/08/2018 CLINICAL DATA:  LEFT facial numbness and weakness. Prior history of strokes. History of atrial fibrillation. History of hypertension. EXAM: MRI HEAD WITHOUT CONTRAST TECHNIQUE: Multiplanar, multiecho pulse sequences of the brain and surrounding structures were obtained without intravenous contrast. COMPARISON:  CT studies earlier in the day.  MR brain 11/04/2015. FINDINGS: Brain: Tubular, almost horizontal, acute RIGHT posterior frontal cortical and subcortical infarct, extending to the centrum semiovale, displays restricted diffusion, corresponding low ADC. See series 3, images 34-36. No other areas of acute infarction are seen. RIGHT inferior frontal hypoattenuation on CT was artifactual. Other areas of indeterminate hypoattenuation on CT, within the RIGHT hemisphere, do not restrict, and represent sequelae of previous RIGHT MCA infarction from 2017. No hemorrhage, mass lesion, hydrocephalus, or extra-axial fluid. Premature for age atrophy. Extensive T2 and FLAIR hyperintensities in the white matter, consistent with small vessel disease. Reperfusion type chronic hemorrhage within  the RIGHT temporal and parietal lobe, sequelae of previous insult. Vascular: Normal flow voids. Skull and upper cervical spine: Normal marrow signal. Spondylosis C4-C5, suspected spinal stenosis. Sinuses/Orbits: No layering fluid.  Negative orbits. Other: None. IMPRESSION: Acute nonhemorrhagic RIGHT MCA territory infarct affects the posterior frontal cortex and subcortical white matter. Atrophy and small vessel disease. Electronically Signed   By: Staci Righter M.D.   On: 02/08/2018 21:00   Ct Head Code Stroke Wo Contrast  Result Date: 02/08/2018 CLINICAL DATA:  Code stroke. LEFT-sided weakness and LEFT-sided facial droop and dizziness. Last seen normal 1338 hours. EXAM: CT HEAD WITHOUT CONTRAST TECHNIQUE: Contiguous axial images were obtained from the base of the skull through the vertex without intravenous contrast. COMPARISON:  MR head 11/04/2015.  CT head 11/03/2015. FINDINGS: Brain: There is evidence for chronic cerebral infarction on the RIGHT, with encephalomalacia, and regional gliosis. Areas of hypoattenuation affecting the RIGHT temporal cortex, and RIGHT frontotemporal periventricular white matter, are indeterminate for acute versus chronic cerebral ischemia. In the inferior frontal cortex on the RIGHT, there is hypoattenuation concerning for acute infarction. See axial series 3, image 11. Chronic encephalomalacia surrounding the central sulcus on the RIGHT. No hemorrhage, mass lesion, hydrocephalus, or extra-axial fluid. The brain is mildly atrophic for age. White matter disease better delineated on MR. Vascular: Calcification of the cavernous internal carotid arteries consistent with cerebrovascular atherosclerotic disease. No signs of intracranial large vessel occlusion. Skull: Calvarium intact. Sinuses/Orbits: No sinus disease.  Negative orbits. Other: None ASPECTS ([place] Stroke Program Early CT Score) - Ganglionic level infarction (caudate, lentiform nuclei, internal capsule, insula, M1-M3  cortex): 5. M1 cortex abnormal, possibly M2 cortex. - Supraganglionic infarction (M4-M6 cortex): 2. M5 cortex and white matter possibly abnormal. Total score (0-10 with 10 being normal): 7-9. IMPRESSION: 1. Chronic RIGHT hemisphere infarction, with questionable new versus residual infarction of the RIGHT temporal operculum as well as RIGHT frontotemporal region. Hypodensity of the RIGHT inferior frontal lobe, not involved with previous infarction, appears to be a new finding. 2. ASPECTS is 7-9. 3. These results were called by telephone at the time of interpretation on 02/08/2018 at 3:28 pm to Dr. Leonel Ramsay , who verbally acknowledged these results. Electronically Signed   By: Staci Righter M.D.   On: 02/08/2018 15:28   2D Echocardiogram  pending   PHYSICAL EXAM obese middle-aged African-American lady currently not in distress. . Afebrile. Head is nontraumatic. Neck is supple without bruit.    Cardiac exam no murmur or gallop. Lungs are clear to auscultation. Distal pulses are well felt. Neurological Exam Patient alert and oriented x 3. Speech mildly dysarthric. No aphasia. Extraoccular movements intact. Visual fields full. L lower facial weakness. Tongue midline. Moves all extremities x 4. Strength normal.except diminished fine finger movements on the left. Orbits right over left upper extremity. Coordination normal. Sensation intact. Gait not tested..   ASSESSMENT/PLAN [name] is a 52 y.o. female with history of previous embolic stroke, AF on xarelto and SVT presenting with slurred speech and R facial droop.   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 ASPECTS 7-9    CTA head & neck normal  MRI  right MCA posterior frontal cortical and subcortical white matter infarcts.  Small vessel disease. Atrophy.   2D Echo  pending   LDL 102  HgbA1c 6.1  SCDs for VTE prophylaxis  Xarelto (rivaroxaban) daily prior to admission, now on aspirin 325 mg daily. Ok to resume xarelto now, orders adjusted.   Therapy recommendations:  No therapy needs  Disposition:  Return home  Atrial Fibrillation w/ RVR  Home anticoagulation:  Xarelto (rivaroxaban) daily, stopped in the hospital  Now on aspirin 325. Ok to resume Xarelto now, orders adjusted  CHA2DS2-VASc Score = at least 4, ?2 oral anticoagulation recommended  Age in Years:  <65   0  Sex:  Female   Female   +1    Hypertension History:  yes   +1     Diabetes Mellitus:  0  Congestive Heart Failure History:  0  Vascular Disease History:  0     Stroke/TIA/Thromboembolism History:  yes   +2  Continue xarelto at discharge   Hypertension  Stable . Permissive hypertension (OK if < 220/120) but gradually normalize in 5-7 days . Long-term BP goal normotensive  Hyperlipidemia  Home meds:  lipitor 20, increased to 80 in hospital  LDL 102, goal < 70  Continue statin at discharge  Other Stroke Risk Factors  Former Cigarette smoker, quit 4 yrs ago  ETOH use, advised to drink no more than 1 drink(s) a day  Obesity, Body mass index is 32.29 kg/m., recommend weight loss, diet and exercise as appropriate   Hx stroke/TIA  10/2015 - R MCA s/p IV tPA  Hospital day # 1  Ivin Booty  Miachel Roux, MSN, APRN, ANVP-BC, AGPCNP-BC Advanced Practice Stroke Nurse [place] for Schedule & Pager information 02/09/2018 2:43 PM  I have personally examined this patient, reviewed notes, independently viewed imaging studies, participated in medical decision making and plan of care.ROS completed by me personally and pertinent positives fully documented  I have made any additions or clarifications directly to the above note. Agree with note above. She has presented with embolic right MCA branch infarct secondary to  atrial fibrillation while having stopped Xarelto for a few days. Patient was counseled to take Xarelto daily and consistently at the same time with a meal. Continue ongoing stroke workup. Anticipate discharge next 1-2 days. Discussed with Dr. Florene Glen. Greater than 50% time during this 35 minute visit was spent on counseling and coordination of care about her embolic stroke and need for compliance with Xarelto and answered questions. Antony Contras, MD Medical Director [place] Pager: [phone] 02/09/2018 3:12 PM  To contact Stroke Continuity provider, please refer to [email or web address] After hours, contact General Neurology

## 2018-02-09 NOTE — Evaluation (Signed)
Physical Therapy Evaluation Patient Details Name: [name] MRN: [id number] DOB: 1965-10-25 Today's Date: 02/09/2018   History of Present Illness  52 y.o. female with medical history significant of A.Fib, prior embolic CVA, on Chronic Xarelto who presented with slurred speech and was found to have R MCA stroke on imaging  Clinical Impression  Orders received for PT evaluation. Patient demonstrates modest deficits in functional mobility as indicated below but overall reports that she is at her mobility baseline. At this time, anticipate patient will be safe for d/c home. No further acute PT needs. Will sign off.    Follow Up Recommendations No PT follow up    Equipment Recommendations  None recommended by PT    Recommendations for Other Services       Precautions / Restrictions Restrictions Weight Bearing Restrictions: No      Mobility  Bed Mobility Overal bed mobility: Independent             General bed mobility comments: no difficulty performing  Transfers Overall transfer level: Independent               General transfer comment: no difficulty  Ambulation/Gait Ambulation/Gait assistance: Independent Ambulation Distance (Feet): 310 Feet Assistive device: None Gait Pattern/deviations: WFL(Within Functional Limits) Gait velocity: decreased Gait velocity interpretation: 1.31 - 2.62 ft/sec, indicative of limited community ambulator General Gait Details: some modestly decreased cadence but patient reports this is her baseline speed  Stairs Stairs: Yes Stairs assistance: Modified independent (Device/Increase time) Stair Management: One rail Right Number of Stairs: 5 General stair comments: no difficulty with task   Wheelchair Mobility    Modified Rankin (Stroke Patients Only) Modified Rankin (Stroke Patients Only) Pre-Morbid Rankin Score: No symptoms Modified Rankin: No symptoms     Balance                                              Pertinent Vitals/Pain Pain Assessment: 0-10 Pain Score: 2  Pain Location: head Pain Descriptors / Indicators: Headache Pain Intervention(s): Monitored during session    Home Living Family/patient expects to be discharged to:: Private residence Living Arrangements: Children   Type of Home: House Home Access: Stairs to enter Entrance Stairs-Rails: Right Entrance Stairs-Number of Steps: 6 Home Layout: One level Home Equipment: None      Prior Function Level of Independence: Independent         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, newspaper   Dominant Hand: Right    Extremity/Trunk Assessment        Lower Extremity Assessment Lower Extremity Assessment: Overall WFL for tasks assessed       Communication   Communication: No difficulties  Cognition Arousal/Alertness: Awake/alert Behavior During Therapy: WFL for tasks assessed/performed Overall Cognitive Status: Within Functional Limits for tasks assessed                                        General Comments      Exercises     Assessment/Plan    PT Assessment Patent does not need any further PT services  PT Problem List Decreased mobility       PT Treatment Interventions      PT Goals (Current goals can be  found in the Care Plan section)  Acute Rehab PT Goals PT Goal Formulation: All assessment and education complete, DC therapy    Frequency     Barriers to discharge        Co-evaluation               AM-PAC PT "6 Clicks" Daily Activity  Outcome Measure Difficulty turning over in bed (including adjusting bedclothes, sheets and blankets)?: None Difficulty moving from lying on back to sitting on the side of the bed? : None Difficulty sitting down on and standing up from a chair with arms (e.g., wheelchair, bedside commode, etc,.)?: None Help needed moving to and from a bed to chair (including a wheelchair)?: None Help needed  walking in hospital room?: A Little Help needed climbing 3-5 steps with a railing? : A Little 6 Click Score: 22    End of Session   Activity Tolerance: Patient tolerated treatment well Patient left: in bed(sitting EOB) Nurse Communication: Mobility status PT Visit Diagnosis: Other symptoms and signs involving the nervous system (A45.364)    Time: 6803-2122 PT Time Calculation (min) (ACUTE ONLY): 18 min   Charges:   PT Evaluation $PT Eval Moderate Complexity: 1 Mod     PT G Codes:        Alben Deeds, PT DPT  Board Certified Neurologic Specialist [place] 02/09/2018, 11:15 AM

## 2018-02-09 NOTE — Progress Notes (Signed)
PROGRESS NOTE    [name]  MOQ:[id number] DOB: October 16, 1965 DOA: 02/08/2018 PCP: Elizabeth Rakes, MD   Brief Narrative:  [name] is [name] 52 y.o. female with medical history significant of [name].Fib, prior embolic CVA, on Chronic Xarelto who presented with slurred speech and was found to have R MCA stroke on imaging.  Assessment & Plan:   Principal Problem:   Acute ischemic right MCA stroke (HCC) Active Problems:   HTN (hypertension)   Chronic anticoagulation   Chronic atrial fibrillation ([name])   1. Acute ischemic R MCA stroke - of note, she'd apparently been out of xarelto for about 7 days and had resumed this on this past Friday.  1. Echo 2. CTA head/neck without flo limiting intracranial or extracranial stenosis, no emergent large vessel occlusion 3. Echo pending 4. A1c 6.1 5. Lipids LDL 101, HDL 56  6. Permissive HTN for first 24-48 hrs 7. PT/OT/SLP 8. Neuro consult, appreciate recs 9. Hold Xarelto for now ASA 325 for next 3-5 days per [name] 10. Increase atorvastatin to 80 mg daily  2. [name].Fib with RVR - 1. Holding Xarelto as above per [name] instructions 2. Continuing Cardizem cardizem for rate control.  Restart metop at 12.5 BID given RVR noted on telemetry.  3. HTN  1. Will continue cardizem for rate control of [name] 2. Resuming metop as noted above.  DVT prophylaxis: SCD, ASA, holding xarelto  Code Status: full  Family Communication: none at bedside Disposition Plan: pending improvement   Consultants:   none  Procedures:   none  Antimicrobials:  Anti-infectives (From admission, onward)   None      Subjective: No complaints this AM.  Notes she noted sx yesterday with slurred speech.  Drooling. No weakness or numbness/tingling she'd noticed.   Objective: Vitals:   02/08/18 2130 02/08/18 2305 02/09/18 0059 02/09/18 0323  BP: (!) 158/109 (!) 152/109 (!) 160/97 (!) 148/105  Pulse: (!) 112 (!) 117  65  Resp: 16 18  18   Temp:  98.4 F  (36.9 C)  98.7 F (37.1 C)  TempSrc:  Oral  Oral  SpO2: 99% 100% 97% 100%  Weight:  84 kg (185 lb 3 oz)    Height:  5' 3.5" (1.613 m)      Intake/Output Summary (Last 24 hours) at 02/09/2018 0743 Last data filed at 02/08/2018 2306 Gross per 24 hour  Intake 480 ml  Output -  Net 480 ml   Filed Weights   02/08/18 1621 02/08/18 1622 02/08/18 2305  Weight: 87.1 kg (192 lb) 85.6 kg (188 lb 11.2 oz) 84 kg (185 lb 3 oz)    Examination:  General exam: Appears calm and comfortable  Respiratory system: Clear to auscultation. Respiratory effort normal. Cardiovascular system: S1 & S2 heard, RRR. No JVD, murmurs, rubs, gallops or clicks. No pedal edema. Gastrointestinal system: Abdomen is nondistended, soft and nontender. No organomegaly or masses felt. Normal bowel sounds heard. Central nervous system: Alert and oriented. L sided facial droop.  5/5 strength to upper and lower extremities.  Extremities: Symmetric 5 x 5 power. Skin: No rashes, lesions or ulcers Psychiatry: Judgement and insight appear normal. Mood & affect appropriate.     Data Reviewed: I have personally reviewed following labs and imaging studies  CBC: Recent Labs  Lab 02/08/18 1445 02/08/18 1447  WBC 10.5  --   NEUTROABS 7.0  --   HGB 14.1 16.3*  HCT 45.4 48.0*  MCV 82.8  --   PLT 222  --  Basic Metabolic Panel: Recent Labs  Lab 02/08/18 1445 02/08/18 1447  NA 142 143  K 3.7 3.6  CL 107 106  CO2 25  --   GLUCOSE 112* 106*  BUN 9 11  CREATININE 0.85 0.70  CALCIUM 9.5  --    GFR: Estimated Creatinine Clearance: 84.5 mL/min (by C-G formula based on SCr of 0.7 mg/dL). Liver Function Tests: Recent Labs  Lab 02/08/18 1445  AST 30  ALT 23  ALKPHOS 108  BILITOT 0.7  PROT 8.9*  ALBUMIN 4.1   No results for input(s): LIPASE, AMYLASE in the last 168 hours. No results for input(s): AMMONIA in the last 168 hours. Coagulation Profile: Recent Labs  Lab 02/08/18 1445  INR 1.79   Cardiac  Enzymes: No results for input(s): CKTOTAL, CKMB, CKMBINDEX, TROPONINI in the last 168 hours. BNP (last 3 results) No results for input(s): PROBNP in the last 8760 hours. HbA1C: Recent Labs    02/09/18 0531  HGBA1C 6.1*   CBG: Recent Labs  Lab 02/08/18 1438  GLUCAP 99   Lipid Profile: Recent Labs    02/09/18 0531  CHOL 175  HDL 56  LDLCALC 102*  TRIG 84  CHOLHDL 3.1   Thyroid Function Tests: No results for input(s): TSH, T4TOTAL, FREET4, T3FREE, THYROIDAB in the last 72 hours. Anemia Panel: No results for input(s): VITAMINB12, FOLATE, FERRITIN, TIBC, IRON, RETICCTPCT in the last 72 hours. Sepsis Labs: No results for input(s): PROCALCITON, LATICACIDVEN in the last 168 hours.  No results found for this or any previous visit (from the past 240 hour(s)).       Radiology Studies: Ct Angio Head W Or Wo Contrast  Result Date: 02/08/2018 CLINICAL DATA:  LEFT-sided weakness began earlier today. EXAM: CT ANGIOGRAPHY HEAD AND NECK TECHNIQUE: Multidetector CT imaging of the head and neck was performed using the standard protocol during bolus administration of intravenous contrast. Multiplanar CT image reconstructions and MIPs were obtained to evaluate the vascular anatomy. Carotid stenosis measurements (when applicable) are obtained utilizing NASCET criteria, using the distal internal carotid diameter as the denominator. CONTRAST:  2mL ISOVUE-370 IOPAMIDOL (ISOVUE-370) INJECTION 76% COMPARISON:  Code stroke CT performed few minutes earlier. Previous MRI 11/02/2015. Previous CTA head neck 11/02/2015. FINDINGS: CTA NECK FINDINGS Aortic arch: Variant branching, LEFT vertebral arises directly from the arch. No proximal stenosis. Right carotid system: Minor atheromatous change at the bifurcation. No evidence of dissection, stenosis, or occlusion. Left carotid system: Atheromatous plaque begins just above the bifurcation, and becomes heavily calcified approximately 1.5 cm above the LICA  origin. There is Curlee Bogan 50% stenosis based on luminal measurements of 2.0/4.0 proximal/distal at the site of the heaviest calcification. No dissection or flow-limiting stenosis. Vertebral arteries: Codominant.  No focal stenosis. Skeleton: Mild spondylosis. No worrisome osseous lesion. Poor dentition. 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 Other neck: No neck masses. Upper chest: No lung apex lesion or pneumothorax. Review of the MIP images confirms the above findings CTA HEAD FINDINGS Anterior circulation: Nonstenotic calcification of the cavernous carotids. No significant stenosis, proximal occlusion, aneurysm, or vascular malformation. There is irregularity of the M3 branches bilaterally suggesting intracranial atherosclerotic disease. Previous RIGHT distal M2 occlusion has recanalized. Posterior circulation: Diminutive basilar artery is supplied by both vertebrals. No significant stenosis, proximal occlusion, or aneurysm. BILATERAL fetal PCA. Venous sinuses: Patent. Anatomic variants: Fetal PCA bilaterally. Delayed phase: Not performed. Review of the MIP images confirms the above findings IMPRESSION: No flow-limiting intracranial or extracranial stenosis. No emergent large vessel  occlusion. MRI recommended for further evaluation. Electronically Signed   By: Staci Righter M.D.   On: 02/08/2018 15:41   Dg Chest 2 View  Result Date: 02/08/2018 CLINICAL DATA:  Left facial droop, slurred speech, and drooling noted at 1330 hours today. Dizziness. EXAM: CHEST - 2 VIEW COMPARISON:  10/05/2015 FINDINGS: The heart size and mediastinal contours are within normal limits. Both lungs are clear. The visualized skeletal structures are unremarkable. IMPRESSION: No active cardiopulmonary disease. Electronically Signed   By: Lucienne Capers M.D.   On: 02/08/2018 22:19   Ct Angio Neck W Or Wo Contrast  Result Date: 02/08/2018 CLINICAL DATA:  LEFT-sided weakness began earlier today.  EXAM: CT ANGIOGRAPHY HEAD AND NECK TECHNIQUE: Multidetector CT imaging of the head and neck was performed using the standard protocol during bolus administration of intravenous contrast. Multiplanar CT image reconstructions and MIPs were obtained to evaluate the vascular anatomy. Carotid stenosis measurements (when applicable) are obtained utilizing NASCET criteria, using the distal internal carotid diameter as the denominator. CONTRAST:  88mL ISOVUE-370 IOPAMIDOL (ISOVUE-370) INJECTION 76% COMPARISON:  Code stroke CT performed few minutes earlier. Previous MRI 11/02/2015. Previous CTA head neck 11/02/2015. FINDINGS: CTA NECK FINDINGS Aortic arch: Variant branching, LEFT vertebral arises directly from the arch. No proximal stenosis. Right carotid system: Minor atheromatous change at the bifurcation. No evidence of dissection, stenosis, or occlusion. Left carotid system: Atheromatous plaque begins just above the bifurcation, and becomes heavily calcified approximately 1.5 cm above the LICA origin. There is Ranveer Wahlstrom 50% stenosis based on luminal measurements of 2.0/4.0 proximal/distal at the site of the heaviest calcification. No dissection or flow-limiting stenosis. Vertebral arteries: Codominant.  No focal stenosis. Skeleton: Mild spondylosis. No worrisome osseous lesion. Poor dentition. 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 Other neck: No neck masses. Upper chest: No lung apex lesion or pneumothorax. Review of the MIP images confirms the above findings CTA HEAD FINDINGS Anterior circulation: Nonstenotic calcification of the cavernous carotids. No significant stenosis, proximal occlusion, aneurysm, or vascular malformation. There is irregularity of the M3 branches bilaterally suggesting intracranial atherosclerotic disease. Previous RIGHT distal M2 occlusion has recanalized. Posterior circulation: Diminutive basilar artery is supplied by both vertebrals. No significant stenosis,  proximal occlusion, or aneurysm. BILATERAL fetal PCA. Venous sinuses: Patent. Anatomic variants: Fetal PCA bilaterally. Delayed phase: Not performed. Review of the MIP images confirms the above findings IMPRESSION: No flow-limiting intracranial or extracranial stenosis. No emergent large vessel occlusion. MRI recommended for further evaluation. Electronically Signed   By: Staci Righter M.D.   On: 02/08/2018 15:41   Mr Brain Wo Contrast  Result Date: 02/08/2018 CLINICAL DATA:  LEFT facial numbness and weakness. Prior history of strokes. History of atrial fibrillation. History of hypertension. EXAM: MRI HEAD WITHOUT CONTRAST TECHNIQUE: Multiplanar, multiecho pulse sequences of the brain and surrounding structures were obtained without intravenous contrast. COMPARISON:  CT studies earlier in the day.  MR brain 11/04/2015. FINDINGS: Brain: Tubular, almost horizontal, acute RIGHT posterior frontal cortical and subcortical infarct, extending to the centrum semiovale, displays restricted diffusion, corresponding low ADC. See series 3, images 34-36. No other areas of acute infarction are seen. RIGHT inferior frontal hypoattenuation on CT was artifactual. Other areas of indeterminate hypoattenuation on CT, within the RIGHT hemisphere, do not restrict, and represent sequelae of previous RIGHT MCA infarction from 2017. No hemorrhage, mass lesion, hydrocephalus, or extra-axial fluid. Premature for age atrophy. Extensive T2 and FLAIR hyperintensities in the white matter, consistent with small vessel disease. Reperfusion type chronic hemorrhage  within the RIGHT temporal and parietal lobe, sequelae of previous insult. Vascular: Normal flow voids. Skull and upper cervical spine: Normal marrow signal. Spondylosis C4-C5, suspected spinal stenosis. Sinuses/Orbits: No layering fluid.  Negative orbits. Other: None. IMPRESSION: Acute nonhemorrhagic RIGHT MCA territory infarct affects the posterior frontal cortex and subcortical white  matter. Atrophy and small vessel disease. Electronically Signed   By: Staci Righter M.D.   On: 02/08/2018 21:00   Ct Head Code Stroke Wo Contrast  Result Date: 02/08/2018 CLINICAL DATA:  Code stroke. LEFT-sided weakness and LEFT-sided facial droop and dizziness. Last seen normal 1338 hours. EXAM: CT HEAD WITHOUT CONTRAST TECHNIQUE: Contiguous axial images were obtained from the base of the skull through the vertex without intravenous contrast. COMPARISON:  MR head 11/04/2015.  CT head 11/03/2015. FINDINGS: Brain: There is evidence for chronic cerebral infarction on the RIGHT, with encephalomalacia, and regional gliosis. Areas of hypoattenuation affecting the RIGHT temporal cortex, and RIGHT frontotemporal periventricular white matter, are indeterminate for acute versus chronic cerebral ischemia. In the inferior frontal cortex on the RIGHT, there is hypoattenuation concerning for acute infarction. See axial series 3, image 11. Chronic encephalomalacia surrounding the central sulcus on the RIGHT. No hemorrhage, mass lesion, hydrocephalus, or extra-axial fluid. The brain is mildly atrophic for age. White matter disease better delineated on MR. Vascular: Calcification of the cavernous internal carotid arteries consistent with cerebrovascular atherosclerotic disease. No signs of intracranial large vessel occlusion. Skull: Calvarium intact. Sinuses/Orbits: No sinus disease.  Negative orbits. Other: None ASPECTS ([place] Stroke Program Early CT Score) - Ganglionic level infarction (caudate, lentiform nuclei, internal capsule, insula, M1-M3 cortex): 5. M1 cortex abnormal, possibly M2 cortex. - Supraganglionic infarction (M4-M6 cortex): 2. M5 cortex and white matter possibly abnormal. Total score (0-10 with 10 being normal): 7-9. IMPRESSION: 1. Chronic RIGHT hemisphere infarction, with questionable new versus residual infarction of the RIGHT temporal operculum as well as RIGHT frontotemporal region. Hypodensity of the  RIGHT inferior frontal lobe, not involved with previous infarction, appears to be Alvia Jablonski new finding. 2. ASPECTS is 7-9. 3. These results were called by telephone at the time of interpretation on 02/08/2018 at 3:28 pm to Dr. Leonel Ramsay , who verbally acknowledged these results. Electronically Signed   By: Staci Righter M.D.   On: 02/08/2018 15:28        Scheduled Meds: . aspirin  300 mg Rectal Daily   Or  . aspirin  325 mg Oral Daily  . atorvastatin  20 mg Oral q1800  . diltiazem  300 mg Oral Daily   Continuous Infusions:   LOS: 1 day    Time spent: over 30 min    Fayrene Helper, MD [facility] Pager [phone]  If 7PM-7AM, please contact night-coverage [email or web address] Password [facility] 02/09/2018, 7:43 AM

## 2018-02-10 LAB — CBC
HCT: 45.9 % (ref 36.0–46.0)
HEMOGLOBIN: 14.3 g/dL (ref 12.0–15.0)
MCH: 25.9 pg — AB (ref 26.0–34.0)
MCHC: 31.2 g/dL (ref 30.0–36.0)
MCV: 83.2 fL (ref 78.0–100.0)
Platelets: 229 10*3/uL (ref 150–400)
RBC: 5.52 MIL/uL — AB (ref 3.87–5.11)
RDW: 15.5 % (ref 11.5–15.5)
WBC: 9.5 10*3/uL (ref 4.0–10.5)

## 2018-02-10 LAB — BASIC METABOLIC PANEL
Anion gap: 13 (ref 5–15)
BUN: 13 mg/dL (ref 6–20)
CHLORIDE: 105 mmol/L (ref 101–111)
CO2: 23 mmol/L (ref 22–32)
Calcium: 9.3 mg/dL (ref 8.9–10.3)
Creatinine, Ser: 0.85 mg/dL (ref 0.44–1.00)
GFR calc Af Amer: >60 mL/min (ref >60)
GFR calc non Af Amer: >60 mL/min (ref >60)
Glucose, Bld: 110 mg/dL — ABNORMAL HIGH (ref 65–99)
POTASSIUM: 3.9 mmol/L (ref 3.5–5.1)
SODIUM: 141 mmol/L (ref 135–145)

## 2018-02-10 LAB — MAGNESIUM: MAGNESIUM: 2 mg/dL (ref 1.7–2.4)

## 2018-02-10 MED ORDER — METOPROLOL TARTRATE 25 MG PO TABS: 12.5000 mg | ORAL_TABLET | Freq: Two times a day (BID) | ORAL | 0 refills | Status: DC

## 2018-02-10 MED ORDER — ATORVASTATIN CALCIUM 80 MG PO TABS: 80.0000 mg | ORAL_TABLET | Freq: Every day | ORAL | 1 refills | Status: DC

## 2018-02-10 MED FILL — ATORVASTATIN 80 MG TABLET: 80 | 30 days supply | Qty: 30 | Fill #0

## 2018-02-10 MED FILL — METOPROLOL TARTRATE 25 MG T: 25 | 30 days supply | Qty: 30 | Fill #0

## 2018-02-10 NOTE — Discharge Summary (Signed)
Physician Discharge Summary [facility]   Patient: [name]                   Admit date: 02/08/2018   DOB: 09/27/1965             Discharge date:02/10/2018/11:54 AM VHQ:[id number]                           PCP: [name], MD Recommendations for Outpatient Follow-up:   1.  Please follow-up with your primary care physician within 1-2 weeks. 2.  Also with neurologist and cardiologist in 2 to 3 weeks    Discharge Condition: Stable  CODE STATUS:  Full code    Diet recommendation:  Cardiac diet ----------------------------------------------------------------------------------------------------------------------  Discharge Diagnoses:   Principal Problem:   Acute ischemic right MCA stroke ([place]) Active Problems:   HTN (hypertension)   Chronic anticoagulation   Chronic atrial fibrillation (HCC)   History of present illness :  [name] a 52 y.o.femalewith medical history significant ofA.Fib, prior embolic CVA, on Chronic Xarelto who presented with slurred speech and was found to have R MCA stroke on imaging.   Hospital course / Brief Summary: Patient was subsequently admitted proceeded with stroke work-up.  He was consulted and following closely. MRI revealed right MCA territory stroke.  Echocardiogram are within normal limits with exception of possible left atrium atrial thrombus, ejection fraction of 60 to 65% neurology recommended no further work-up including TEE as course of treatment would not change.,  CTA head and neck was within normal limits Patient is to continue Xarelto, aspirin and full dose statins. Good rate control with Cardizem and metoprolol which was reduced from 25 to 12.5 mg She has been cleared to be discharged home   Detailed discharge summary please see below  Stroke:  right MCA posterior frontal cortical and subcortical white matter infarcts embolic secondary to known atrial fibrillation off AC  Code Stroke CT head chronic R  infarct, questionable new vs residual infarct R temporal operculum and R frontotemporal. ASPECTS 7-9    CTA head & neck normal  MRI  right MCA posterior frontal cortical and subcortical white matter infarcts. Small vessel disease. Atrophy.   2D Echo  EF 60-65%. ?LA thrombus  - further testing will not alter treatment, which would be AC, which pt already on long-term  LDL 102  HgbA1c 6.1  SCDs for VTE prophylaxis  Xarelto (rivaroxaban) daily prior to admission, now on Xarelto (rivaroxaban) daily. Continue xarelto at d/c. No need for additional aspirin.   Therapy recommendations:  No therapy needs  Disposition:  Return home. Ok to return to work Monday, June 4  Atrial Fibrillation w/ RVR  Home anticoagulation:  Xarelto (rivaroxaban) daily, stopped in the hospital  Now on Xarelto  CHA2DS2-VASc Score = at least 4, ?2 oral anticoagulation recommended             Age in Years:  <65   0             Sex:  Female   Female   +1                      Hypertension History:  yes   +1                        Diabetes Mellitus:  0  Congestive Heart Failure History:  0             Vascular Disease History:  0                           Stroke/TIA/Thromboembolism History:  yes   +2  Continue xarelto   Hypertension  Stable  Long-term BP goal normotensive  Hyperlipidemia  Home meds:  lipitor 20, increased to 80 in hospital  LDL 102, goal < 70 Continue statin full dose  Other Stroke Risk Factors  Former Cigarette smoker, quit 4 yrs ago  ETOH use, advised to drink no more than 1 drink(s) a day  Obesity, Body mass index is 32.29 kg/m., recommend weight loss, diet and exercise as appropriate   Hx stroke/TIA ? 10/2015 - R MCA s/p IV tPA  Disposition - patient is cleared to be discharged home To be seen by PT/OT, no residual findings from the stroke, no need for home health PT    Consultations:  Neurology  Procedures:  CT/CTA head and neck, MRI of the brain,  2D echocardiogram  ----------------------------------------------------------------------------------------------------------------------  Discharge Instructions:   Discharge Instructions    Activity as tolerated - No restrictions   Complete by:  As directed    Ambulatory referral to Neurology   Complete by:  As directed    Follow up with stroke clinic NP ([name] or [name], if both not available, consider Dr. Antony Murphy, Dr. Bess Murphy, or Dr. Sarina Murphy) at [facility] in about 4 weeks.   Diet - low sodium heart healthy   Complete by:  As directed    Discharge instructions   Complete by:  As directed    With neurology and cardiology and your PCP in 1 to 2 weeks   Increase activity slowly   Complete by:  As directed        Medication List    TAKE these medications   atorvastatin 80 MG tablet Commonly known as:  LIPITOR Take 1 tablet (80 mg total) by mouth daily at 6 PM. What changed:    medication strength  how much to take  how to take this  when to take this  additional instructions   butalbital-acetaminophen-caffeine 50-325-40 MG tablet Commonly known as:  FIORICET, ESGIC TAKE 1 TABLET BY MOUTH EVERY 12 HOURS AS NEEDED FOR HEADACHE   diltiazem 300 MG 24 hr capsule Commonly known as:  CARDIZEM CD Take 1 capsule (300 mg total) by mouth daily.   metoprolol tartrate 25 MG tablet Commonly known as:  LOPRESSOR Take 0.5 tablets (12.5 mg total) by mouth 2 (two) times daily. What changed:  how much to take   rivaroxaban 20 MG Tabs tablet Commonly known as:  XARELTO Take 1 tablet (20 mg total) by mouth daily with supper.      Follow-up Information    [facility] Follow up in 4 week(s).   Specialty:  Neurology Why:  stroke clinic. office will call you with appt date and time Contact information: [place] [address] [phone]         Allergies    Allergen Reactions  . Penicillins Hives and Other (See Comments)    Unknown  Has patient had a PCN reaction causing immediate rash, facial/tongue/throat swelling, SOB or lightheadedness with hypotension: No Has patient had a PCN reaction causing severe rash involving mucus membranes or skin necrosis: No Has patient had a PCN  reaction that required hospitalization No Has patient had a PCN reaction occurring within the last 10 years: No If all of the above answers are "NO", then may proceed with Cephalosporin use.      Procedures/Studies: Ct Angio Head W Or Wo Contrast  Result Date: 02/08/2018 CLINICAL DATA:  LEFT-sided weakness began earlier today. EXAM: CT ANGIOGRAPHY HEAD AND NECK TECHNIQUE: Multidetector CT imaging of the head and neck was performed using the standard protocol during bolus administration of intravenous contrast. Multiplanar CT image reconstructions and MIPs were obtained to evaluate the vascular anatomy. Carotid stenosis measurements (when applicable) are obtained utilizing NASCET criteria, using the distal internal carotid diameter as the denominator. CONTRAST:  40mL ISOVUE-370 IOPAMIDOL (ISOVUE-370) INJECTION 76% COMPARISON:  Code stroke CT performed few minutes earlier. Previous MRI 11/02/2015. Previous CTA head neck 11/02/2015. FINDINGS: CTA NECK FINDINGS Aortic arch: Variant branching, LEFT vertebral arises directly from the arch. No proximal stenosis. Right carotid system: Minor atheromatous change at the bifurcation. No evidence of dissection, stenosis, or occlusion. Left carotid system: Atheromatous plaque begins just above the bifurcation, and becomes heavily calcified approximately 1.5 cm above the LICA origin. There is a 50% stenosis based on luminal measurements of 2.0/4.0 proximal/distal at the site of the heaviest calcification. No dissection or flow-limiting stenosis. Vertebral arteries: Codominant.  No focal stenosis. Skeleton: Mild spondylosis. No worrisome  osseous lesion. Poor dentition. 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 Other neck: No neck masses. Upper chest: No lung apex lesion or pneumothorax. Review of the MIP images confirms the above findings CTA HEAD FINDINGS Anterior circulation: Nonstenotic calcification of the cavernous carotids. No significant stenosis, proximal occlusion, aneurysm, or vascular malformation. There is irregularity of the M3 branches bilaterally suggesting intracranial atherosclerotic disease. Previous RIGHT distal M2 occlusion has recanalized. Posterior circulation: Diminutive basilar artery is supplied by both vertebrals. No significant stenosis, proximal occlusion, or aneurysm. BILATERAL fetal PCA. Venous sinuses: Patent. Anatomic variants: Fetal PCA bilaterally. Delayed phase: Not performed. Review of the MIP images confirms the above findings IMPRESSION: No flow-limiting intracranial or extracranial stenosis. No emergent large vessel occlusion. MRI recommended for further evaluation. Electronically Signed   By: Staci Righter M.D.   On: 02/08/2018 15:41   Dg Chest 2 View  Result Date: 02/08/2018 CLINICAL DATA:  Left facial droop, slurred speech, and drooling noted at 1330 hours today. Dizziness. EXAM: CHEST - 2 VIEW COMPARISON:  10/05/2015 FINDINGS: The heart size and mediastinal contours are within normal limits. Both lungs are clear. The visualized skeletal structures are unremarkable. IMPRESSION: No active cardiopulmonary disease. Electronically Signed   By: Lucienne Capers M.D.   On: 02/08/2018 22:19   Ct Angio Neck W Or Wo Contrast  Result Date: 02/08/2018 CLINICAL DATA:  LEFT-sided weakness began earlier today. EXAM: CT ANGIOGRAPHY HEAD AND NECK TECHNIQUE: Multidetector CT imaging of the head and neck was performed using the standard protocol during bolus administration of intravenous contrast. Multiplanar CT image reconstructions and MIPs were obtained to evaluate the  vascular anatomy. Carotid stenosis measurements (when applicable) are obtained utilizing NASCET criteria, using the distal internal carotid diameter as the denominator. CONTRAST:  50mL ISOVUE-370 IOPAMIDOL (ISOVUE-370) INJECTION 76% COMPARISON:  Code stroke CT performed few minutes earlier. Previous MRI 11/02/2015. Previous CTA head neck 11/02/2015. FINDINGS: CTA NECK FINDINGS Aortic arch: Variant branching, LEFT vertebral arises directly from the arch. No proximal stenosis. Right carotid system: Minor atheromatous change at the bifurcation. No evidence of dissection, stenosis, or occlusion. Left carotid system: Atheromatous plaque begins  just above the bifurcation, and becomes heavily calcified approximately 1.5 cm above the LICA origin. There is a 50% stenosis based on luminal measurements of 2.0/4.0 proximal/distal at the site of the heaviest calcification. No dissection or flow-limiting stenosis. Vertebral arteries: Codominant.  No focal stenosis. Skeleton: Mild spondylosis. No worrisome osseous lesion. Poor dentition. 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 Other neck: No neck masses. Upper chest: No lung apex lesion or pneumothorax. Review of the MIP images confirms the above findings CTA HEAD FINDINGS Anterior circulation: Nonstenotic calcification of the cavernous carotids. No significant stenosis, proximal occlusion, aneurysm, or vascular malformation. There is irregularity of the M3 branches bilaterally suggesting intracranial atherosclerotic disease. Previous RIGHT distal M2 occlusion has recanalized. Posterior circulation: Diminutive basilar artery is supplied by both vertebrals. No significant stenosis, proximal occlusion, or aneurysm. BILATERAL fetal PCA. Venous sinuses: Patent. Anatomic variants: Fetal PCA bilaterally. Delayed phase: Not performed. Review of the MIP images confirms the above findings IMPRESSION: No flow-limiting intracranial or extracranial  stenosis. No emergent large vessel occlusion. MRI recommended for further evaluation. Electronically Signed   By: Staci Righter M.D.   On: 02/08/2018 15:41   Mr Brain Wo Contrast  Result Date: 02/08/2018 CLINICAL DATA:  LEFT facial numbness and weakness. Prior history of strokes. History of atrial fibrillation. History of hypertension. EXAM: MRI HEAD WITHOUT CONTRAST TECHNIQUE: Multiplanar, multiecho pulse sequences of the brain and surrounding structures were obtained without intravenous contrast. COMPARISON:  CT studies earlier in the day.  MR brain 11/04/2015. FINDINGS: Brain: Tubular, almost horizontal, acute RIGHT posterior frontal cortical and subcortical infarct, extending to the centrum semiovale, displays restricted diffusion, corresponding low ADC. See series 3, images 34-36. No other areas of acute infarction are seen. RIGHT inferior frontal hypoattenuation on CT was artifactual. Other areas of indeterminate hypoattenuation on CT, within the RIGHT hemisphere, do not restrict, and represent sequelae of previous RIGHT MCA infarction from 2017. No hemorrhage, mass lesion, hydrocephalus, or extra-axial fluid. Premature for age atrophy. Extensive T2 and FLAIR hyperintensities in the white matter, consistent with small vessel disease. Reperfusion type chronic hemorrhage within the RIGHT temporal and parietal lobe, sequelae of previous insult. Vascular: Normal flow voids. Skull and upper cervical spine: Normal marrow signal. Spondylosis C4-C5, suspected spinal stenosis. Sinuses/Orbits: No layering fluid.  Negative orbits. Other: None. IMPRESSION: Acute nonhemorrhagic RIGHT MCA territory infarct affects the posterior frontal cortex and subcortical white matter. Atrophy and small vessel disease. Electronically Signed   By: Staci Righter M.D.   On: 02/08/2018 21:00   Ct Head Code Stroke Wo Contrast  Result Date: 02/08/2018 CLINICAL DATA:  Code stroke. LEFT-sided weakness and LEFT-sided facial droop and  dizziness. Last seen normal 1338 hours. EXAM: CT HEAD WITHOUT CONTRAST TECHNIQUE: Contiguous axial images were obtained from the base of the skull through the vertex without intravenous contrast. COMPARISON:  MR head 11/04/2015.  CT head 11/03/2015. FINDINGS: Brain: There is evidence for chronic cerebral infarction on the RIGHT, with encephalomalacia, and regional gliosis. Areas of hypoattenuation affecting the RIGHT temporal cortex, and RIGHT frontotemporal periventricular white matter, are indeterminate for acute versus chronic cerebral ischemia. In the inferior frontal cortex on the RIGHT, there is hypoattenuation concerning for acute infarction. See axial series 3, image 11. Chronic encephalomalacia surrounding the central sulcus on the RIGHT. No hemorrhage, mass lesion, hydrocephalus, or extra-axial fluid. The brain is mildly atrophic for age. White matter disease better delineated on MR. Vascular: Calcification of the cavernous internal carotid arteries consistent with cerebrovascular atherosclerotic disease. No  signs of intracranial large vessel occlusion. Skull: Calvarium intact. Sinuses/Orbits: No sinus disease.  Negative orbits. Other: None ASPECTS ([place] Stroke Program Early CT Score) - Ganglionic level infarction (caudate, lentiform nuclei, internal capsule, insula, M1-M3 cortex): 5. M1 cortex abnormal, possibly M2 cortex. - Supraganglionic infarction (M4-M6 cortex): 2. M5 cortex and white matter possibly abnormal. Total score (0-10 with 10 being normal): 7-9. IMPRESSION: 1. Chronic RIGHT hemisphere infarction, with questionable new versus residual infarction of the RIGHT temporal operculum as well as RIGHT frontotemporal region. Hypodensity of the RIGHT inferior frontal lobe, not involved with previous infarction, appears to be a new finding. 2. ASPECTS is 7-9. 3. These results were called by telephone at the time of interpretation on 02/08/2018 at 3:28 pm to Dr. Leonel Ramsay , who verbally acknowledged  these results. Electronically Signed   By: Staci Righter M.D.   On: 02/08/2018 15:28      Subjective: Patient was seen and examined 02/10/2018, 11:54 AM Patient stable  Today. No acute distress.  No issues overnight Stable for discharge.  Discharge Exam:  Vitals:   02/09/18 1959 02/09/18 2333 02/10/18 0346 02/10/18 0745  BP: (!) 128/92 (!) 131/91 (!) 141/101 (!) 143/101  Pulse: 83 86 63 92  Resp: 18 16 18 16   Temp: 98.2 F (36.8 C) 97.7 F (36.5 C) 98.4 F (36.9 C) 98 F (36.7 C)  TempSrc: Oral Oral Oral Oral  SpO2: 100% 100% 100% 99%  Weight:      Height:        General: Pt lying comfortably in bed & appears in no obvious distress. Cardiovascular: S1 & S2 heard, RRR, S1/S2 +. No murmurs, rubs, gallops or clicks. No JVD or pedal edema. Respiratory: Clear to auscultation without wheezing, rhonchi or crackles. No increased work of breathing. Abdominal:  Non distended, non tender & soft. No organomegaly or masses appreciated. Normal bowel sounds heard. CNS: Alert and oriented. No focal deficits. Extremities: no edema, no cyanosis    The results of significant diagnostics from this hospitalization (including imaging, microbiology, ancillary and laboratory) are listed below for reference.     Microbiology: No results found for this or any previous visit (from the past 240 hour(s)).   Labs: CBC: Recent Labs  Lab 02/08/18 1445 02/08/18 1447 02/10/18 0627  WBC 10.5  --  9.5  NEUTROABS 7.0  --   --   HGB 14.1 16.3* 14.3  HCT 45.4 48.0* 45.9  MCV 82.8  --  83.2  PLT 222  --  932   Basic Metabolic Panel: Recent Labs  Lab 02/08/18 1445 02/08/18 1447 02/10/18 0627  NA 142 143 141  K 3.7 3.6 3.9  CL 107 106 105  CO2 25  --  23  GLUCOSE 112* 106* 110*  BUN 9 11 13   CREATININE 0.85 0.70 0.85  CALCIUM 9.5  --  9.3  MG  --   --  2.0   Liver Function Tests: Recent Labs  Lab 02/08/18 1445  AST 30  ALT 23  ALKPHOS 108  BILITOT 0.7  PROT 8.9*  ALBUMIN 4.1    BNP (last 3 results) No results for input(s): BNP in the last 8760 hours. Cardiac Enzymes: No results for input(s): CKTOTAL, CKMB, CKMBINDEX, TROPONINI in the last 168 hours. CBG: Recent Labs  Lab 02/08/18 1438  GLUCAP 99   Hgb A1c Recent Labs    02/09/18 0531  HGBA1C 6.1*   Lipid Profile Recent Labs    02/09/18 0531  CHOL 175  HDL 56  LDLCALC 102*  TRIG 84  CHOLHDL 3.1    Urinalysis    Component Value Date/Time   COLORURINE YELLOW 11/02/2015 1136   APPEARANCEUR CLEAR 11/02/2015 1136   LABSPEC 1.016 11/02/2015 1136   PHURINE 6.0 11/02/2015 1136   GLUCOSEU NEGATIVE 11/02/2015 1136   HGBUR NEGATIVE 11/02/2015 1136   BILIRUBINUR NEGATIVE 11/02/2015 1136   KETONESUR NEGATIVE 11/02/2015 1136   PROTEINUR NEGATIVE 11/02/2015 1136   UROBILINOGEN 1 03/22/2015 1301   NITRITE NEGATIVE 11/02/2015 1136   LEUKOCYTESUR SMALL (A) 11/02/2015 1136    Time coordinating discharge: Over 30 minutes  SIGNED: Deatra James, MD, FACP, FHM. [facility],  Pager [phone]  If 7PM-7AM, please contact night-coverage [email or web address] 02/10/2018, 11:54 AM

## 2018-02-10 NOTE — Progress Notes (Signed)
Pt discharge education and instructions completed with pt at bedside; pt voices understanding and denies any questions. Pt IV and telemetry removed; pt handed her requested letter from MD; pt to pick up electronically sent prescriptions from preferred pharmacy on file. Pt discharge with friend to transport her home. Pt transported off unit via wheelchair with belongings to the side. Delia Heady RN

## 2018-02-10 NOTE — Progress Notes (Addendum)
STROKE TEAM PROGRESS NOTE  INTERVAL HISTORY Back on xarelto. Occasional slurred speech but no therapy needs. Asking about going back to work and if aspirin needed in addition to [employer].  Vitals:   02/09/18 1959 02/09/18 2333 02/10/18 0346 02/10/18 0745  BP: (!) 128/92 (!) 131/91 (!) 141/101 (!) 143/101  Pulse: 83 86 63 92  Resp: 18 16 18 16   Temp: 98.2 F (36.8 C) 97.7 F (36.5 C) 98.4 F (36.9 C) 98 F (36.7 C)  TempSrc: Oral Oral Oral Oral  SpO2: 100% 100% 100% 99%  Weight:      Height:        CBC:  Recent Labs  Lab 02/08/18 1445 02/08/18 1447 02/10/18 0627  WBC 10.5  --  9.5  NEUTROABS 7.0  --   --   HGB 14.1 16.3* 14.3  HCT 45.4 48.0* 45.9  MCV 82.8  --  83.2  PLT 222  --  627    Basic Metabolic Panel:  Recent Labs  Lab 02/08/18 1445 02/08/18 1447 02/10/18 0627  NA 142 143 141  K 3.7 3.6 3.9  CL 107 106 105  CO2 25  --  23  GLUCOSE 112* 106* 110*  BUN 9 11 13   CREATININE 0.85 0.70 0.85  CALCIUM 9.5  --  9.3  MG  --   --  2.0   Lipid Panel:     Component Value Date/Time   CHOL 175 02/09/2018 0531   CHOL 138 03/03/2017 0848   TRIG 84 02/09/2018 0531   HDL 56 02/09/2018 0531   HDL 59 03/03/2017 0848   CHOLHDL 3.1 02/09/2018 0531   VLDL 17 02/09/2018 0531   LDLCALC 102 (H) 02/09/2018 0531   LDLCALC 63 03/03/2017 0848   HgbA1c:  Lab Results  Component Value Date   HGBA1C 6.1 (H) 02/09/2018   Urine Drug Screen:     Component Value Date/Time   LABOPIA NONE DETECTED 11/02/2015 1136   COCAINSCRNUR NONE DETECTED 11/02/2015 1136   LABBENZ NONE DETECTED 11/02/2015 1136   AMPHETMU NONE DETECTED 11/02/2015 1136   THCU NONE DETECTED 11/02/2015 1136   LABBARB NONE DETECTED 11/02/2015 1136    Alcohol Level     Component Value Date/Time   ETH <5 11/02/2015 1114    IMAGING Ct Angio Head W Or Wo Contrast  Result Date: 02/08/2018 CLINICAL DATA:  LEFT-sided weakness began earlier today. EXAM: CT ANGIOGRAPHY HEAD AND NECK TECHNIQUE: Multidetector  CT imaging of the head and neck was performed using the standard protocol during bolus administration of intravenous contrast. Multiplanar CT image reconstructions and MIPs were obtained to evaluate the vascular anatomy. Carotid stenosis measurements (when applicable) are obtained utilizing NASCET criteria, using the distal internal carotid diameter as the denominator. CONTRAST:  52mL ISOVUE-370 IOPAMIDOL (ISOVUE-370) INJECTION 76% COMPARISON:  Code stroke CT performed few minutes earlier. Previous MRI 11/02/2015. Previous CTA head neck 11/02/2015. FINDINGS: CTA NECK FINDINGS Aortic arch: Variant branching, LEFT vertebral arises directly from the arch. No proximal stenosis. Right carotid system: Minor atheromatous change at the bifurcation. No evidence of dissection, stenosis, or occlusion. Left carotid system: Atheromatous plaque begins just above the bifurcation, and becomes heavily calcified approximately 1.5 cm above the LICA origin. There is a 50% stenosis based on luminal measurements of 2.0/4.0 proximal/distal at the site of the heaviest calcification. No dissection or flow-limiting stenosis. Vertebral arteries: Codominant.  No focal stenosis. Skeleton: Mild spondylosis. No worrisome osseous lesion. Poor dentition. 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 Other  neck: No neck masses. Upper chest: No lung apex lesion or pneumothorax. Review of the MIP images confirms the above findings CTA HEAD FINDINGS Anterior circulation: Nonstenotic calcification of the cavernous carotids. No significant stenosis, proximal occlusion, aneurysm, or vascular malformation. There is irregularity of the M3 branches bilaterally suggesting intracranial atherosclerotic disease. Previous RIGHT distal M2 occlusion has recanalized. Posterior circulation: Diminutive basilar artery is supplied by both vertebrals. No significant stenosis, proximal occlusion, or aneurysm. BILATERAL fetal PCA.  Venous sinuses: Patent. Anatomic variants: Fetal PCA bilaterally. Delayed phase: Not performed. Review of the MIP images confirms the above findings IMPRESSION: No flow-limiting intracranial or extracranial stenosis. No emergent large vessel occlusion. MRI recommended for further evaluation. Electronically Signed   By: Staci Righter M.D.   On: 02/08/2018 15:41   Dg Chest 2 View  Result Date: 02/08/2018 CLINICAL DATA:  Left facial droop, slurred speech, and drooling noted at 1330 hours today. Dizziness. EXAM: CHEST - 2 VIEW COMPARISON:  10/05/2015 FINDINGS: The heart size and mediastinal contours are within normal limits. Both lungs are clear. The visualized skeletal structures are unremarkable. IMPRESSION: No active cardiopulmonary disease. Electronically Signed   By: Lucienne Capers M.D.   On: 02/08/2018 22:19   Ct Angio Neck W Or Wo Contrast  Result Date: 02/08/2018 CLINICAL DATA:  LEFT-sided weakness began earlier today. EXAM: CT ANGIOGRAPHY HEAD AND NECK TECHNIQUE: Multidetector CT imaging of the head and neck was performed using the standard protocol during bolus administration of intravenous contrast. Multiplanar CT image reconstructions and MIPs were obtained to evaluate the vascular anatomy. Carotid stenosis measurements (when applicable) are obtained utilizing NASCET criteria, using the distal internal carotid diameter as the denominator. CONTRAST:  38mL ISOVUE-370 IOPAMIDOL (ISOVUE-370) INJECTION 76% COMPARISON:  Code stroke CT performed few minutes earlier. Previous MRI 11/02/2015. Previous CTA head neck 11/02/2015. FINDINGS: CTA NECK FINDINGS Aortic arch: Variant branching, LEFT vertebral arises directly from the arch. No proximal stenosis. Right carotid system: Minor atheromatous change at the bifurcation. No evidence of dissection, stenosis, or occlusion. Left carotid system: Atheromatous plaque begins just above the bifurcation, and becomes heavily calcified approximately 1.5 cm above the  LICA origin. There is a 50% stenosis based on luminal measurements of 2.0/4.0 proximal/distal at the site of the heaviest calcification. No dissection or flow-limiting stenosis. Vertebral arteries: Codominant.  No focal stenosis. Skeleton: Mild spondylosis. No worrisome osseous lesion. Poor dentition. 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 Other neck: No neck masses. Upper chest: No lung apex lesion or pneumothorax. Review of the MIP images confirms the above findings CTA HEAD FINDINGS Anterior circulation: Nonstenotic calcification of the cavernous carotids. No significant stenosis, proximal occlusion, aneurysm, or vascular malformation. There is irregularity of the M3 branches bilaterally suggesting intracranial atherosclerotic disease. Previous RIGHT distal M2 occlusion has recanalized. Posterior circulation: Diminutive basilar artery is supplied by both vertebrals. No significant stenosis, proximal occlusion, or aneurysm. BILATERAL fetal PCA. Venous sinuses: Patent. Anatomic variants: Fetal PCA bilaterally. Delayed phase: Not performed. Review of the MIP images confirms the above findings IMPRESSION: No flow-limiting intracranial or extracranial stenosis. No emergent large vessel occlusion. MRI recommended for further evaluation. Electronically Signed   By: Staci Righter M.D.   On: 02/08/2018 15:41   Mr Brain Wo Contrast  Result Date: 02/08/2018 CLINICAL DATA:  LEFT facial numbness and weakness. Prior history of strokes. History of atrial fibrillation. History of hypertension. EXAM: MRI HEAD WITHOUT CONTRAST TECHNIQUE: Multiplanar, multiecho pulse sequences of the brain and surrounding structures were obtained without intravenous  contrast. COMPARISON:  CT studies earlier in the day.  MR brain 11/04/2015. FINDINGS: Brain: Tubular, almost horizontal, acute RIGHT posterior frontal cortical and subcortical infarct, extending to the centrum semiovale, displays  restricted diffusion, corresponding low ADC. See series 3, images 34-36. No other areas of acute infarction are seen. RIGHT inferior frontal hypoattenuation on CT was artifactual. Other areas of indeterminate hypoattenuation on CT, within the RIGHT hemisphere, do not restrict, and represent sequelae of previous RIGHT MCA infarction from 2017. No hemorrhage, mass lesion, hydrocephalus, or extra-axial fluid. Premature for age atrophy. Extensive T2 and FLAIR hyperintensities in the white matter, consistent with small vessel disease. Reperfusion type chronic hemorrhage within the RIGHT temporal and parietal lobe, sequelae of previous insult. Vascular: Normal flow voids. Skull and upper cervical spine: Normal marrow signal. Spondylosis C4-C5, suspected spinal stenosis. Sinuses/Orbits: No layering fluid.  Negative orbits. Other: None. IMPRESSION: Acute nonhemorrhagic RIGHT MCA territory infarct affects the posterior frontal cortex and subcortical white matter. Atrophy and small vessel disease. Electronically Signed   By: Staci Righter M.D.   On: 02/08/2018 21:00   Ct Head Code Stroke Wo Contrast  Result Date: 02/08/2018 CLINICAL DATA:  Code stroke. LEFT-sided weakness and LEFT-sided facial droop and dizziness. Last seen normal 1338 hours. EXAM: CT HEAD WITHOUT CONTRAST TECHNIQUE: Contiguous axial images were obtained from the base of the skull through the vertex without intravenous contrast. COMPARISON:  MR head 11/04/2015.  CT head 11/03/2015. FINDINGS: Brain: There is evidence for chronic cerebral infarction on the RIGHT, with encephalomalacia, and regional gliosis. Areas of hypoattenuation affecting the RIGHT temporal cortex, and RIGHT frontotemporal periventricular white matter, are indeterminate for acute versus chronic cerebral ischemia. In the inferior frontal cortex on the RIGHT, there is hypoattenuation concerning for acute infarction. See axial series 3, image 11. Chronic encephalomalacia surrounding the  central sulcus on the RIGHT. No hemorrhage, mass lesion, hydrocephalus, or extra-axial fluid. The brain is mildly atrophic for age. White matter disease better delineated on MR. Vascular: Calcification of the cavernous internal carotid arteries consistent with cerebrovascular atherosclerotic disease. No signs of intracranial large vessel occlusion. Skull: Calvarium intact. Sinuses/Orbits: No sinus disease.  Negative orbits. Other: None ASPECTS (Theresa Stroke Program Early CT Score) - Ganglionic level infarction (caudate, lentiform nuclei, internal capsule, insula, M1-M3 cortex): 5. M1 cortex abnormal, possibly M2 cortex. - Supraganglionic infarction (M4-M6 cortex): 2. M5 cortex and white matter possibly abnormal. Total score (0-10 with 10 being normal): 7-9. IMPRESSION: 1. Chronic RIGHT hemisphere infarction, with questionable new versus residual infarction of the RIGHT temporal operculum as well as RIGHT frontotemporal region. Hypodensity of the RIGHT inferior frontal lobe, not involved with previous infarction, appears to be a new finding. 2. ASPECTS is 7-9. 3. These results were called by telephone at the time of interpretation on 02/08/2018 at 3:28 pm to Dr. Leonel Ramsay , who verbally acknowledged these results. Electronically Signed   By: Staci Righter M.D.   On: 02/08/2018 15:28   2D Echocardiogram  - Left ventricle: The cavity size was normal. There was mild concentric hypertrophy. Systolic function was normal. The estimated ejection fraction was in the range of 60% to 65%. Wall motion was normal; there were no regional wall motion abnormalities. - Aortic valve: Transvalvular velocity was within the normal range. There was no stenosis. There was mild regurgitation. Valve area (VTI): 2.17 cm^2. Valve area (Vmax): 2.35 cm^2. Valve area (Vmean): 2 cm^2. - Mitral valve: Transvalvular velocity was within the normal range. There was no evidence for stenosis. There was no  regurgitation. Valve area by pressure  half-time: 1.29 cm^2. - Left atrium: The atrium was moderately dilated. There appears to be a 1.2 cm (L) x 2.2 cm (W) thrombus in the high atrial cavity. - Right ventricle: The cavity size was normal. Wall thickness was normal. Systolic function was normal. - Atrial septum: No defect or patent foramen ovale was identified by color flow Doppler. - Tricuspid valve: There was mild regurgitation. - Pulmonary arteries: Systolic pressure was within the normal range. PA peak pressure: 27 mm Hg (S). - Pericardium, extracardiac: A trivial pericardial effusion was identified. Impressions:   There appears to be a 1.2 cm (L) x 2.2 cm (W) thrombus in the high atrial cavity. Consider TEE to better evaluate.   PHYSICAL EXAM obese middle-aged African-American lady currently not in distress. . Afebrile. Head is nontraumatic. Neck is supple without bruit.    Cardiac exam no murmur or gallop. Lungs are clear to auscultation. Distal pulses are well felt. Neurological Exam Patient alert and oriented x 3. Speech mildly dysarthric. No aphasia. Extraoccular movements intact. Visual fields full. Mild L lower facial weakness. Tongue midline. Moves all extremities x 4. Strength normal. fine finger movements equal. No Orbiting. Coordination normal. Sensation intact. Gait not tested..   ASSESSMENT/PLAN [name] is a 52 y.o. female with history of previous embolic stroke, AF on xarelto and SVT presenting with slurred speech and R facial droop.   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 ASPECTS 7-9    CTA head & neck normal  MRI  right MCA posterior frontal cortical and subcortical white matter infarcts. Small vessel disease. Atrophy.   2D Echo  EF 60-65%. ?LA thrombus  - further testing will not  alter treatment, which would be AC, which pt already on long-term  LDL 102  HgbA1c 6.1  SCDs for VTE prophylaxis  Xarelto (rivaroxaban) daily prior to admission, now on Xarelto (rivaroxaban) daily. Continue xarelto at d/c. No need for additional aspirin.   Therapy recommendations:  No therapy needs  Disposition:  Return home. Ok to return to work Monday, June 4  Atrial Fibrillation w/ RVR  Home anticoagulation:  Xarelto (rivaroxaban) daily, stopped in the hospital  Now on Xarelto  CHA2DS2-VASc Score = at least 4, ?2 oral anticoagulation recommended  Age in Years:  <65   0  Sex:  Female   Female   +1    Hypertension History:  yes   +1     Diabetes Mellitus:  0  Congestive Heart Failure History:  0  Vascular Disease History:  0     Stroke/TIA/Thromboembolism History:  yes   +2  Continue xarelto at discharge   Hypertension  Stable . Permissive hypertension (OK if < 220/120) but gradually normalize in 5-7 days . Long-term BP goal normotensive  Hyperlipidemia  Home meds:  lipitor 20, increased to 80 in hospital  LDL 102, goal < 70  Continue statin at discharge  Other Stroke Risk Factors  Former Cigarette smoker, quit 4 yrs ago  ETOH use, advised to drink no more than 1 drink(s) a day  Obesity, Body mass index is 32.29 kg/m., recommend weight loss, diet and exercise as appropriate   Hx stroke/TIA  10/2015 - R MCA s/p IV tPA  Hospital day # 2  Burnetta Sabin, MSN, APRN, ANVP-BC,  AGPCNP-BC Advanced Practice Stroke Nurse [place] for Schedule & Pager information 02/10/2018 8:21 AM  Continue Xarelto. Patient counseled to be compliant with her medications. Discharge home today. Follow-up as an outpatient in the stroke clinic in 6 weeks. Stroke team will sign off. Kindly call for questions. Antony Contras, MD Medical Director [place] Pager: [phone] 02/10/2018 3:12 PM To contact Stroke Continuity provider, please refer  to [email or web address] After hours, contact General Neurology

## 2018-02-10 NOTE — Discharge Instructions (Signed)

## 2018-02-10 NOTE — Progress Notes (Addendum)
CM consulted for medication assistance. Pt has [employer] but still is having trouble affording her Xarelto. CM spoke to [facility] and she is on a 70/30 plan that makes her co pays less but doesn't provide as much assistance with medications. They did state that she is able to use manufacturer coupons for medications. CM provided her the $10 copay card for the Xarelto.  No f/u per PT/OT and no DME needs. Pt has PCP and transportation home.

## 2018-02-11 ENCOUNTER — Other Ambulatory Visit: Payer: Self-pay | Admitting: Family Medicine

## 2018-02-11 DIAGNOSIS — I482 Chronic atrial fibrillation, unspecified: Secondary | ICD-10-CM

## 2018-02-12 ENCOUNTER — Other Ambulatory Visit: Payer: Self-pay

## 2018-02-12 NOTE — Patient Outreach (Signed)
[place] [facility]) Care Management  02/12/2018  [name] 01-28-1966 [id number]   EMMI: stroke red alert Referral date: 02/12/18 Referral reason: Scheduled follow up: no Insurance: [employer] blue shield Day # 1 Attempt #1  Telephone call to patient regarding utilization management referral. Unable to reach patient. HIPAA compliant voice message left with call back phone number.   PLAN: RNCM will attempt 2nd telephone call to patient within 4 business days.   Quinn Plowman RN,BSN,CCM [facility] Telephonic  [phone]   PLAN:

## 2018-02-16 ENCOUNTER — Other Ambulatory Visit: Payer: Self-pay

## 2018-02-16 NOTE — Patient Outreach (Signed)
[facility]) Care Management  02/16/2018  [name] 1966-08-14 [id number]    EMMI: stroke red alert Referral date: 02/12/18 Referral reason: Scheduled follow up: no Insurance: [employer] cross blue shield Day # 1 Attempt #2  Telephone call to patient regarding utilization management referral. Unable to reach patient. HIPAA compliant voice message left with call back phone number.   PLAN: RNCM will attempt 3rd  telephone call to patient within 4 business days.   Quinn Plowman RN,BSN,CCM [facility] Telephonic  [phone]

## 2018-02-19 ENCOUNTER — Other Ambulatory Visit: Payer: Self-pay

## 2018-02-19 NOTE — Patient Outreach (Signed)
[place] [facility]) Care Management  02/19/2018  SHALEIGH LAUBSCHER 08-18-1966 [id number]    EMMI:stroke red alert Referral date:02/12/18 Referral reason:Scheduled follow up: no Insurance: [employer] cross blue shield Day #1 Attempt #3  Telephone call to patient regarding utilization management referral. Unable to reach patient. HIPAA compliant voice message left with call back phone number.   PLAN: RNCM will close patient due to being unable to reach.   Quinn Plowman RN,BSN,CCM [facility] Telephonic  [phone]

## 2018-03-08 ENCOUNTER — Telehealth: Payer: Self-pay | Admitting: Family Medicine

## 2018-03-08 MED FILL — BUTALB-ACETAMIN-CAFF 50-325: 50-325-40 | 20 days supply | Qty: 40 | Fill #1

## 2018-03-08 MED FILL — CARTIA XT 300 MG CAPSULE SA: 300 | 30 days supply | Qty: 30 | Fill #4

## 2018-03-08 MED FILL — XARELTO 20 MG TABLET: 20 | 30 days supply | Qty: 30 | Fill #0

## 2018-03-08 NOTE — Telephone Encounter (Signed)
Medication was rejectedXARELTO 20 MG TABS tablet [[id number]]  paperwork placed in box

## 2018-03-10 ENCOUNTER — Ambulatory Visit: Payer: BC Managed Care – PPO | Attending: Family Medicine

## 2018-03-10 ENCOUNTER — Other Ambulatory Visit: Payer: Self-pay | Admitting: Family Medicine

## 2018-03-10 DIAGNOSIS — Z111 Encounter for screening for respiratory tuberculosis: Secondary | ICD-10-CM | POA: Diagnosis not present

## 2018-03-10 DIAGNOSIS — Z1231 Encounter for screening mammogram for malignant neoplasm of breast: Secondary | ICD-10-CM

## 2018-03-10 NOTE — Progress Notes (Signed)
Tuberculin skin test applied to left ventral forearm.  Patient informed to schedule appt for nurse visit in 48-72 hours to have site read.  Jay'A R Slayter Moorhouse, RMA  

## 2018-03-10 NOTE — Patient Instructions (Signed)
Make an nurse visit 48-72 hours to have your ppd read  Tuberculin purified protein derivative, PPD injection What is this medicine? TUBERCULIN PURIFIED PROTEIN DERIVATIVE, PPD is a test used to detect if you have a tuberculosis infection. It will not cause tuberculosis infection. This medicine may be used for other purposes; ask your health care provider or pharmacist if you have questions. COMMON BRAND NAME(S): Aplisol, Tubersol What should I tell my health care provider before I take this medicine? They need to know if you have any of these conditions: -diabetes -HIV or AIDS -immune system problems -infection (especially a virus infection such as chickenpox, cold sores, or herpes) -kidney disease -malnutrition -an unusual or allergic reaction to tuberculin purified protein derivative, PPD, other medicines, foods, dyes, or preservatives -pregnant or trying to get pregnant -breast-feeding How should I use this medicine? This medicine is for injection in the skin. It is given by a health care professional in a hospital or clinic setting. Talk to your pediatrician regarding the use of this medicine in children. While this drug may be prescribed in children, precautions do apply. Overdosage: If you think you have taken too much of this medicine contact a poison control center or emergency room at once. NOTE: This medicine is only for you. Do not share this medicine with others. What if I miss a dose? It is important not to miss your dose. Call your doctor or health care professional if you are unable to keep an appointment. What may interact with this medicine? -adalimumab -anakinra -etanercept -infliximab -live virus vaccines -medicines to treat cancer -steroid medicines like prednisone or cortisone This list may not describe all possible interactions. Give your health care provider a list of all the medicines, herbs, non-prescription drugs, or dietary supplements you use. Also tell  them if you smoke, drink alcohol, or use illegal drugs. Some items may interact with your medicine. What should I watch for while using this medicine? See your health care provider as directed. This medicine does not protect against tuberculosis. What side effects may I notice from receiving this medicine? Side effects that you should report to your doctor or health care professional as soon as possible: -allergic reactions like skin rash, itching or hives, swelling of the face, lips, or tongue -breathing problems Side effects that usually do not require medical attention (report to your doctor or health care professional if they continue or are bothersome): -bruising -pain, redness, or irritation at site where injected This list may not describe all possible side effects. Call your doctor for medical advice about side effects. You may report side effects to FDA at 1-800-FDA-1088. Where should I keep my medicine? This drug is given in a hospital or clinic and will not be stored at home. NOTE: This sheet is a summary. It may not cover all possible information. If you have questions about this medicine, talk to your doctor, pharmacist, or health care provider.  2018 Elsevier/Gold Standard (2015-10-04 11:42:34)

## 2018-03-11 NOTE — Progress Notes (Signed)
[facility] [address] [place]. [place] [address] [phone]       OFFICE FOLLOW-UP NOTE  Elizabeth. [name] Date of Birth:  03-30-1966 Medical Record Number:  [id number]   HPI: [name] is a 52 year old African-American lady with past medical history of right MCA embolic stroke in 09/9415, AF, HLD, HTN and SVT.  Patient came to ED in 10/2015 with symptoms of hand weakness and "slumping to the left".  At this time patient had discontinued herself off of Xarelto due to seeing TV advertisements of bleeding but does have a known history of atrial fibrillation for which she was prescribed this for.  IV TPA was given during this admission and patient was noted to have significant improvement of her left hemineglect along with left hemiparesis.   04/03/2016 follow-up Dr. Leonie Man: She states she's done well since discharge and made a full neurological recovery and has no residual weakness. She was supposed to see me 2 months after discharge but could not afford the co-pay and hence did not schedule the appointment. She complains of mild headaches that she's had ever since her stroke. These occur 2 or 3 times per week and a bifrontal. Mild 4/10 in severity. They're dull in nature. There is no complain nausea, vomiting, light or sound sensitivity. She does not need any specific medications for these. She has been now compliant with Xarelto which is tolerating well without bleeding or bruising. She also remains on Zocor is tolerating it well without muscle aches or pains. Blood pressure is well controlled today it is borderline at 140/91.  ADMISSION 02/08/18: Patient was recently seen the after presenting with slurred speech and right facial droop.  CT head reviewed and showed chronic right infarct along with questionable new versus residual infarct in the right temporal operculum and right frontal temporal regions.  CTA head and neck was unremarkable.  MRI reviewed and showed right MCA posterior  frontal cortical and subcortical white matter infarcts along with small vessel disease and atrophy.  2D echo showed an EF of 60 to 65%.  Unfortunately patient did miss 4 to 5 days of Xarelto due to financial issues but as patient states that she will ensure she will not miss her dose again. It was recommended to continue Xarelto for atrial fibrillation and secondary stroke prevention.  LDL 102 and Lipitor was increased from 20 mg to 80 mg.  A1c satisfactory at 6.1.  No additional therapy was needed and patient discharged home in stable condition.  03/15/2018 update: Patient returns today for follow-up after recent hospitalization.  From this most recent stroke, patient states all symptoms resolved such as aphasia and facial droop.  Patient does have residual right-sided weakness from past stroke.  She did not need additional therapies once discharged from this most recent hospitalization.  Patient continues to be compliant on Xarelto without side effects of bleeding or bruising.  Continues to take Lipitor without side effects of myalgias.  As patient does have continued headaches she takes Fioricet every morning and this typically will help her throughout the day.  Blood pressure mildly elevated at 141/88.  Patient continues to do all previous activities along with working without complications.  Denies new or worsening stroke/TIA symptoms.   ROS:   14 system review of systems is positive for headache, murmur, snoring, numbness and weakness only and all other systems negative   PMH:  Past Medical History:  Diagnosis Date  . Clotting disorder ([place])    on xarelto  .  Essential hypertension   . Heart murmur    a. 10/2015 Echo: EF 60-65%, no rwma, mild AI/MR, sev dil LA/RA, PASP 67mmHg.  [name] Noncompliance   . NSVT (nonsustained ventricular tachycardia) ([place])    a. 10/2015 during admission for CVA/AF.  [name] Paroxysmal A-fib ([place])    a. CHA2DS2VASC = 4-->Xarelto;  b. 02/2016 Successful DCCV.  [name] Pneumonia 2012 x  2; 2015  . Stroke [facility])    a. 0/5397 Embolic CVA of mid right middle cerebral atery - recieved TPA-->small amt of asymptomatic hemorrhagic transformation.  . Transient ischemic attack (TIA) 01/2013    Social History:  Social History   Socioeconomic History  . Marital status: Single    Spouse name: Not on file  . Number of children: Not on file  . Years of education: Not on file  . Highest education level: Not on file  Occupational History  . Occupation: Airline pilot: [employer]  . Occupation: in home health aide  Social Needs  . Financial resource strain: Not on file  . Food insecurity:    Worry: Not on file    Inability: Not on file  . Transportation needs:    Medical: Not on file    Non-medical: Not on file  Tobacco Use  . Smoking status: Former Smoker    Years: 3.00    Types: Cigarettes    Last attempt to quit: 03/15/2013    Years since quitting: 5.0  . Smokeless tobacco: Never Used  Substance and Sexual Activity  . Alcohol use: Yes    Comment: drinks on the weekends, 2 drinks and 2 beers  . Drug use: No  . Sexual activity: Not Currently    Birth control/protection: None  Lifestyle  . Physical activity:    Days per week: Not on file    Minutes per session: Not on file  . Stress: Not on file  Relationships  . Social connections:    Talks on phone: Not on file    Gets together: Not on file    Attends religious service: Not on file    Active member of club or organization: Not on file    Attends meetings of clubs or organizations: Not on file    Relationship status: Not on file  . Intimate partner violence:    Fear of current or ex partner: Not on file    Emotionally abused: Not on file    Physically abused: Not on file    Forced sexual activity: Not on file  Other Topics Concern  . Not on file  Social History Narrative  . Not on file    Medications:   Current Outpatient Medications on File Prior to Visit  Medication Sig Dispense Refill    . atorvastatin (LIPITOR) 80 MG tablet Take 1 tablet (80 mg total) by mouth daily at 6 PM. 30 tablet 1  . butalbital-acetaminophen-caffeine (FIORICET, ESGIC) 50-325-40 MG tablet TAKE 1 TABLET BY MOUTH EVERY 12 HOURS AS NEEDED FOR HEADACHE 40 tablet 1  . diltiazem (CARDIZEM CD) 300 MG 24 hr capsule Take 1 capsule (300 mg total) by mouth daily. 30 capsule 5  . metoprolol tartrate (LOPRESSOR) 25 MG tablet Take 0.5 tablets (12.5 mg total) by mouth 2 (two) times daily. 30 tablet 0  . rivaroxaban (XARELTO) 20 MG TABS tablet Take 1 tablet (20 mg total) by mouth daily with supper. 30 tablet 5   Current Facility-Administered Medications on File Prior to Visit  Medication Dose Route Frequency  Provider Last Rate Last Dose  . 0.9 %  sodium chloride infusion  500 mL Intravenous Once Armbruster, Carlota Raspberry, MD        Allergies:   Allergies  Allergen Reactions  . Penicillins Hives and Other (See Comments)    Unknown  Has patient had a PCN reaction causing immediate rash, facial/tongue/throat swelling, SOB or lightheadedness with hypotension: No Has patient had a PCN reaction causing severe rash involving mucus membranes or skin necrosis: No Has patient had a PCN reaction that required hospitalization No Has patient had a PCN reaction occurring within the last 10 years: No If all of the above answers are "NO", then may proceed with Cephalosporin use.    Physical Exam General: Pleasant mildly obese middle-aged African-American lady seated, in no evident distress Head: head normocephalic and atraumatic.  Neck: supple with no carotid or supraclavicular bruits Cardiovascular: irregular rate and rhythm due to atrial fibrillation, no murmurs Musculoskeletal: no deformity Skin:  no rash/petichiae Vascular:  Normal pulses all extremities Vitals:   03/15/18 0942  BP: (!) 141/88  Pulse: 77   Neurologic Exam Mental Status: Awake and fully alert. Oriented to place and time. Recent and remote memory intact.  Attention span, concentration and fund of knowledge appropriate. Mood and affect appropriate.  Cranial Nerves: Fundoscopic exam reveals sharp disc margins. Pupils equal, briskly reactive to light. Extraocular movements full without nystagmus. Visual fields full to confrontation. Hearing intact. Facial sensation intact. Face, tongue, palate moves normally and symmetrically.  Motor: Normal bulk and tone.  4/5 RUE, 5/5 in all other tested extremities Sensory.: intact to touch ,pinprick .position and vibratory sensation.  Coordination: Rapid alternating movements normal in all extremities. Finger-to-nose and heel-to-shin performed accurately bilaterally. Gait and Station: Arises from chair without difficulty. Stance is normal. Gait demonstrates normal stride length and balance . Able to heel, toe and tandem walk without difficulty.  Reflexes: 1+ and symmetric. Toes downgoing.   NIHSS  0 Modified [place]  1  IMAGING, LABS, PROCEDURES:  CT HEAD WO CONTRAST 02/08/2018 IMPRESSION: 1. Chronic RIGHT hemisphere infarction, with questionable new versus residual infarction of the RIGHT temporal operculum as well as RIGHT frontotemporal region. Hypodensity of the RIGHT inferior frontal lobe, not involved with previous infarction, appears to be a new Finding.  CT ANGIO HEAD/NECK W OR WO CONTRAST 02/08/2018 IMPRESSION: No flow-limiting intracranial or extracranial stenosis. No emergent large vessel occlusion.  MR BRAIN WO CONTRAST 02/08/2018 IMPRESSION: Acute nonhemorrhagic RIGHT MCA territory infarct affects the posterior frontal cortex and subcortical white matter. Atrophy and small vessel disease.  ECHOCARDIOGRAM 02/09/18 Study Conclusions - Left ventricle: The cavity size was normal. There was mild   concentric hypertrophy. Systolic function was normal. The   estimated ejection fraction was in the range of 60% to 65%. Wall   motion was normal; there were no regional wall motion    abnormalities. - Aortic valve: Transvalvular velocity was within the normal range.   There was no stenosis. There was mild regurgitation. Valve area   (VTI): 2.17 cm^2. Valve area (Vmax): 2.35 cm^2. Valve area   (Vmean): 2 cm^2. - Mitral valve: Transvalvular velocity was within the normal range.   There was no evidence for stenosis. There was no regurgitation.   Valve area by pressure half-time: 1.29 cm^2. - Left atrium: The atrium was moderately dilated. There appears to   be a 1.2 cm (L) x 2.2 cm (W) thrombus in the high atrial cavity. - Right ventricle: The cavity size was normal. Wall thickness  was   normal. Systolic function was normal. - Atrial septum: No defect or patent foramen ovale was identified   by color flow Doppler. - Tricuspid valve: There was mild regurgitation. - Pulmonary arteries: Systolic pressure was within the normal   range. PA peak pressure: 27 mm Hg (S). - Pericardium, extracardiac: A trivial pericardial effusion was   identified.     ASSESSMENT: 1 year African-American lady with embolic right MCA branch infarct in February 2017 second to cardiac embolism from atrial flutter/fibrillation and recurrent right MCA infarct on 02/08/2018 due to atrial fibrillation off from Xarelto noncompliance due to financial reasons.  Vascular risk factors include atrial fibrillation, HTN and HLD.  Patient returns today for hospital stroke follow-up.  -Continue Xarelto (rivaroxaban) daily  and lipitor  for secondary stroke prevention -F/u with PCP regarding your a fib, HTN and HLD management  -educated patient on importance of compliance with Xarelto for stroke prevention -continue to monitor BP at home -Advised patient to continue to stay active and eat a healthy diet -Maintain strict control of hypertension with blood pressure goal below 130/90, diabetes with hemoglobin A1c goal below 6.5% and cholesterol with LDL cholesterol (bad cholesterol) goal below 70 mg/dL. I also  advised the patient to eat a healthy diet with plenty of whole grains, cereals, fruits and vegetables, exercise regularly and maintain ideal body weight.  Follow up  as needed as patient has regular 42-month follow-up with cardiologist (and due to financial reasons) the patient was advised to call earlier with questions or concerns  Greater than 50% of time during this 25 minute visit was spent on counseling,explanation of diagnosis of right MCA, reviewing risk factor management of HLD, HTN and a fib, planning of further management, discussion with patient and family and coordination of care  Venancio Poisson, AGNP-BC  [facility] [address] [place], [place] [address]  Phone [phone] Fax [phone] Note: This document was prepared with digital dictation and possible smart phrase technology. Any transcriptional errors that result from this process are unintentional.

## 2018-03-12 ENCOUNTER — Ambulatory Visit: Payer: BC Managed Care – PPO | Attending: Family Medicine

## 2018-03-12 DIAGNOSIS — Z111 Encounter for screening for respiratory tuberculosis: Secondary | ICD-10-CM

## 2018-03-12 LAB — TB SKIN TEST
Induration: 0 mm
TB SKIN TEST: NEGATIVE

## 2018-03-12 NOTE — Progress Notes (Signed)
Patient here today to have PPD site read.   PPD read and results entered in EpicCare. Result: 0 mm induration. Interpretation: Negative Jay'A R Pollock, RMA   

## 2018-03-15 ENCOUNTER — Ambulatory Visit: Payer: BC Managed Care – PPO | Admitting: Adult Health

## 2018-03-15 ENCOUNTER — Encounter: Payer: Self-pay | Admitting: Adult Health

## 2018-03-15 VITALS — BP 141/88 | HR 77 | Ht 63.0 in | Wt 193.0 lb

## 2018-03-15 DIAGNOSIS — I10 Essential (primary) hypertension: Secondary | ICD-10-CM

## 2018-03-15 DIAGNOSIS — E785 Hyperlipidemia, unspecified: Secondary | ICD-10-CM | POA: Diagnosis not present

## 2018-03-15 DIAGNOSIS — I63411 Cerebral infarction due to embolism of right middle cerebral artery: Secondary | ICD-10-CM

## 2018-03-15 DIAGNOSIS — I4819 Other persistent atrial fibrillation: Secondary | ICD-10-CM

## 2018-03-15 DIAGNOSIS — I481 Persistent atrial fibrillation: Secondary | ICD-10-CM

## 2018-03-15 NOTE — Patient Instructions (Addendum)
Continue Xarelto (rivaroxaban) daily  and lipitor  for secondary stroke prevention  Continue to follow up with PCP regarding cholesterol and blood pressure management   Continue to follow up with cardiologist regarding atrial fibrillation and Xarelto management  Continue to monitor blood pressure at home  Maintain strict control of hypertension with blood pressure goal below 130/90, diabetes with hemoglobin A1c goal below 6.5% and cholesterol with LDL cholesterol (bad cholesterol) goal below 70 mg/dL. I also advised the patient to eat a healthy diet with plenty of whole grains, cereals, fruits and vegetables, exercise regularly and maintain ideal body weight.  Followup in the future with me as needed or call with questions or concerns         Thank you for coming to see [place] at [facility]. I hope we have been able to provide you high quality care today.  You may receive a patient satisfaction survey over the next few weeks. We would appreciate your feedback and comments so that we may continue to improve ourselves and the health of our patients.

## 2018-03-22 NOTE — Progress Notes (Signed)
I agree with the above plan 

## 2018-04-01 MED FILL — ATORVASTATIN 80 MG TABLET: 80 | 30 days supply | Qty: 30 | Fill #1

## 2018-04-07 ENCOUNTER — Other Ambulatory Visit: Payer: Self-pay | Admitting: Family Medicine

## 2018-04-07 MED FILL — XARELTO 20 MG TABLET: 20 | 30 days supply | Qty: 30 | Fill #1

## 2018-04-07 MED FILL — CARTIA XT 300 MG CAPSULE SA: 300 | 30 days supply | Qty: 30 | Fill #5

## 2018-04-14 ENCOUNTER — Encounter: Payer: Self-pay | Admitting: Family Medicine

## 2018-04-14 ENCOUNTER — Ambulatory Visit: Payer: BC Managed Care – PPO | Attending: Family Medicine | Admitting: Family Medicine

## 2018-04-14 VITALS — BP 147/98 | HR 99 | Temp 98.0°F | Ht 63.0 in | Wt 193.2 lb

## 2018-04-14 DIAGNOSIS — Z880 Allergy status to penicillin: Secondary | ICD-10-CM | POA: Insufficient documentation

## 2018-04-14 DIAGNOSIS — I482 Chronic atrial fibrillation, unspecified: Secondary | ICD-10-CM

## 2018-04-14 DIAGNOSIS — Z9889 Other specified postprocedural states: Secondary | ICD-10-CM | POA: Insufficient documentation

## 2018-04-14 DIAGNOSIS — E785 Hyperlipidemia, unspecified: Secondary | ICD-10-CM | POA: Insufficient documentation

## 2018-04-14 DIAGNOSIS — M79601 Pain in right arm: Secondary | ICD-10-CM | POA: Insufficient documentation

## 2018-04-14 DIAGNOSIS — Z7901 Long term (current) use of anticoagulants: Secondary | ICD-10-CM | POA: Diagnosis not present

## 2018-04-14 DIAGNOSIS — I4892 Unspecified atrial flutter: Secondary | ICD-10-CM | POA: Diagnosis not present

## 2018-04-14 DIAGNOSIS — I69351 Hemiplegia and hemiparesis following cerebral infarction affecting right dominant side: Secondary | ICD-10-CM | POA: Diagnosis not present

## 2018-04-14 DIAGNOSIS — Z79899 Other long term (current) drug therapy: Secondary | ICD-10-CM | POA: Diagnosis not present

## 2018-04-14 DIAGNOSIS — Z9851 Tubal ligation status: Secondary | ICD-10-CM | POA: Diagnosis not present

## 2018-04-14 DIAGNOSIS — I63511 Cerebral infarction due to unspecified occlusion or stenosis of right middle cerebral artery: Secondary | ICD-10-CM | POA: Diagnosis not present

## 2018-04-14 DIAGNOSIS — I10 Essential (primary) hypertension: Secondary | ICD-10-CM

## 2018-04-14 DIAGNOSIS — R29898 Other symptoms and signs involving the musculoskeletal system: Secondary | ICD-10-CM

## 2018-04-14 MED ORDER — METHOCARBAMOL 500 MG PO TABS: 500.0000 mg | ORAL_TABLET | Freq: Three times a day (TID) | ORAL | 1 refills | Status: DC | PRN

## 2018-04-14 MED ORDER — BUTALBITAL-APAP-CAFFEINE 50-325-40 MG PO TABS: ORAL_TABLET | ORAL | 1 refills | Status: DC

## 2018-04-14 MED ORDER — DILTIAZEM HCL ER COATED BEADS 300 MG PO CP24: 300.0000 mg | ORAL_CAPSULE | Freq: Every day | ORAL | 6 refills | Status: DC

## 2018-04-14 MED ORDER — GABAPENTIN 300 MG PO CAPS: 300.0000 mg | ORAL_CAPSULE | Freq: Two times a day (BID) | ORAL | 3 refills | Status: DC

## 2018-04-14 MED ORDER — ATORVASTATIN CALCIUM 80 MG PO TABS: 80.0000 mg | ORAL_TABLET | Freq: Every day | ORAL | 6 refills | Status: DC

## 2018-04-14 MED ORDER — METOPROLOL TARTRATE 25 MG PO TABS: 12.5000 mg | ORAL_TABLET | Freq: Two times a day (BID) | ORAL | 6 refills | Status: DC

## 2018-04-14 MED ORDER — RIVAROXABAN 20 MG PO TABS: 20.0000 mg | ORAL_TABLET | Freq: Every day | ORAL | 6 refills | Status: DC

## 2018-04-14 NOTE — Patient Instructions (Signed)

## 2018-04-14 NOTE — Progress Notes (Signed)
Subjective:  Patient ID: [name], female    DOB: Feb 01, 1966  Age: 52 y.o. MRN: [id number]  CC: Arm Pain (right arm)   HPI [name] is a 52 year old female with a history of atrial fibrillation/atrial flutter (status post cardioversion in 02/2016 currently on rate control with metoprolol and Cardizem and anticoagulation Xarelto), cerebral infarction due to embolism of right middle cerebral artery status post IV TPA (in 10/2015) recently hospitalized for a right MCA stroke in 01/2018. She had presented with facial droop and right upper extremity weakness and MRI revealed nonhemorrhagic right MCA territory stroke.  CT angiogram of the head and neck was normal, TEE negative for vegetation, 2D echo revealed EF of 60 to 65%. She had endorsed being out of Xarelto for 7 days. She was closely followed by neurology and her medications were optimized after which she was subsequently discharged.  She presents today complaining of residual right arm weakness and pain with some associated tingling.  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 She denies chest pains or shortness of breath. She saw neurology earlier this month but has an upcoming appointment with cardiology.  Past Medical History:  Diagnosis Date  . Clotting disorder ([place])    on xarelto  . Essential hypertension   . Heart murmur    a. 10/2015 Echo: EF 60-65%, no rwma, mild AI/MR, sev dil LA/RA, PASP 79mmHg.  [name] Noncompliance   . NSVT (nonsustained ventricular tachycardia) ([place])    a. 10/2015 during admission for CVA/AF.  [name] Paroxysmal A-fib ([place])    a. CHA2DS2VASC = 4-->Xarelto;  b. 02/2016 Successful DCCV.  [name] Pneumonia 2012 x 2; 2015  . Stroke [facility])    a. 12/979 Embolic CVA of mid right middle cerebral atery - recieved TPA-->small amt of asymptomatic hemorrhagic transformation.  . Transient ischemic attack (TIA) 01/2013    Past Surgical History:    Procedure Laterality Date  . CARDIOVERSION N/A 02/18/2013   Procedure: CARDIOVERSION;  Surgeon: Sanda Klein, MD;  Location: [place] [facility];  Service: Cardiovascular;  Laterality: N/A;  . CARDIOVERSION N/A 02/23/2013   Procedure: CARDIOVERSION;  Surgeon: Leonie Man, MD;  Location: [place];  Service: Cardiovascular;  Laterality: N/A;  BESIDE CV  . CARDIOVERSION N/A 03/12/2016   Procedure: CARDIOVERSION;  Surgeon: Larey Dresser, MD;  Location: [place];  Service: Cardiovascular;  Laterality: N/A;  . TEE WITHOUT CARDIOVERSION N/A 02/18/2013   Procedure: TRANSESOPHAGEAL ECHOCARDIOGRAM (TEE);  Surgeon: Sanda Klein, MD;  Location: [facility] ENDOSCOPY;  Service: Cardiovascular;  Laterality: N/A;  . TUBAL LIGATION  1992    Allergies  Allergen Reactions  . Penicillins Hives and Other (See Comments)    Unknown  Has patient had a PCN reaction causing immediate rash, facial/tongue/throat swelling, SOB or lightheadedness with hypotension: No Has patient had a PCN reaction causing severe rash involving mucus membranes or skin necrosis: No Has patient had a PCN reaction that required hospitalization No Has patient had a PCN reaction occurring within the last 10 years: No If all of the above answers are "NO", then may proceed with Cephalosporin use.     Outpatient Medications Prior to Visit  Medication Sig Dispense Refill  . atorvastatin (LIPITOR) 80 MG tablet Take 1 tablet (80 mg total) by mouth daily at 6 PM. 30 tablet 1  . butalbital-acetaminophen-caffeine (FIORICET, ESGIC) 50-325-40 MG tablet TAKE 1 TABLET BY MOUTH EVERY 12 HOURS AS NEEDED FOR  HEADACHE. 40 tablet 0  . rivaroxaban (XARELTO) 20 MG TABS tablet Take 1 tablet (20 mg total) by mouth daily with supper. 30 tablet 5  . diltiazem (CARDIZEM CD) 300 MG 24 hr capsule Take 1 capsule (300 mg total) by mouth daily. 30 capsule 5  . metoprolol tartrate (LOPRESSOR) 25 MG tablet Take 0.5 tablets (12.5 mg total) by mouth 2 (two) times daily. 30  tablet 0   Facility-Administered Medications Prior to Visit  Medication Dose Route Frequency Provider Last Rate Last Dose  . 0.9 %  sodium chloride infusion  500 mL Intravenous Once Armbruster, Carlota Raspberry, MD        ROS Review of Systems  Constitutional: Negative for activity change, appetite change and fatigue.  HENT: Negative for congestion, sinus pressure and sore throat.   Eyes: Negative for visual disturbance.  Respiratory: Negative for cough, chest tightness, shortness of breath and wheezing.   Cardiovascular: Negative for chest pain and palpitations.  Gastrointestinal: Negative for abdominal distention, abdominal pain and constipation.  Endocrine: Negative for polydipsia.  Genitourinary: Negative for dysuria and frequency.  Musculoskeletal:       See hpi  Skin: Negative for rash.  Neurological: Positive for weakness and numbness. Negative for tremors and light-headedness.  Hematological: Does not bruise/bleed easily.  Psychiatric/Behavioral: Negative for agitation and behavioral problems.    Objective:  BP (!) 147/98   Pulse 99   Temp 98 F (36.7 C) (Oral)   Ht 5\' 3"  (1.6 m)   Wt 193 lb 3.2 oz (87.6 kg)   SpO2 99%   BMI 34.22 kg/m   BP/Weight 04/14/2018 03/15/2018 1/74/9449  Systolic BP 675 916 384  Diastolic BP 98 88 665  Wt. (Lbs) 193.2 193 -  BMI 34.22 34.19 -     Physical Exam  Constitutional: She is oriented to person, place, and time. She appears well-developed and well-nourished.  Neck: No JVD present.  Cardiovascular: Normal rate, normal heart sounds and intact distal pulses.  No murmur heard. Irregularly irregular heart rhythm  Pulmonary/Chest: Effort normal and breath sounds normal. She has no wheezes. She has no rales. She exhibits no tenderness.  Abdominal: Soft. Bowel sounds are normal. She exhibits no distension and no mass. There is no tenderness.  Musculoskeletal: Normal range of motion.  Active abduction of right upper extremity-limited to 80  degrees Active abduction of left approximately - normal  Neurological: She is alert and oriented to person, place, and time.  Handgrip: Right-4/5, left-5/5  Skin: Skin is warm and dry.  Psychiatric: She has a normal mood and affect.    CMP Latest Ref Rng & Units 02/10/2018 02/08/2018 02/08/2018  Glucose 65 - 99 mg/dL 110(H) 106(H) 112(H)  BUN 6 - 20 mg/dL 13 11 9   Creatinine 0.44 - 1.00 mg/dL 0.85 0.70 0.85  Sodium 135 - 145 mmol/L 141 143 142  Potassium 3.5 - 5.1 mmol/L 3.9 3.6 3.7  Chloride 101 - 111 mmol/L 105 106 107  CO2 22 - 32 mmol/L 23 - 25  Calcium 8.9 - 10.3 mg/dL 9.3 - 9.5  Total Protein 6.5 - 8.1 g/dL - - 8.9(H)  Total Bilirubin 0.3 - 1.2 mg/dL - - 0.7  Alkaline Phos 38 - 126 U/L - - 108  AST 15 - 41 U/L - - 30  ALT 14 - 54 U/L - - 23    Lipid Panel     Component Value Date/Time   CHOL 175 02/09/2018 0531   CHOL 138 03/03/2017 0848   TRIG  84 02/09/2018 0531   HDL 56 02/09/2018 0531   HDL 59 03/03/2017 0848   CHOLHDL 3.1 02/09/2018 0531   VLDL 17 02/09/2018 0531   LDLCALC 102 (H) 02/09/2018 0531   LDLCALC 63 03/03/2017 0848   CHA2DS2/VAS Stroke Risk Points  Current as of 5 hours ago     4 >= 2 Points: High Risk  1 - 1.99 Points: Medium Risk  0 Points: Low Risk    This is the only CHA2DS2/VAS Stroke Risk Points available for the past  year.:  Last Change: N/A     Details    This score determines the patient's risk of having a stroke if the  patient has atrial fibrillation.       Points Metrics  0 Has Congestive Heart Failure:  No    Current as of 5 hours ago  0 Has Vascular Disease:  No    Current as of 5 hours ago  1 Has Hypertension:  Yes    Current as of 5 hours ago  0 Age:  89    Current as of 5 hours ago  0 Has Diabetes:  No    Current as of 5 hours ago  2 Had Stroke:  Yes  Had TIA:  No  Had thromboembolism:  No    Current as of 5 hours ago  1 Female:  Yes    Current as of 5 hours ago           Assessment & Plan:   1. Chronic atrial  fibrillation (HCC) Currently in A. Fib CHA2DSVASC score =4 Advised to call her cardiologist office for sooner appointment - diltiazem (CARDIZEM CD) 300 MG 24 hr capsule; Take 1 capsule (300 mg total) by mouth daily.  Dispense: 30 capsule; Refill: 6 - rivaroxaban (XARELTO) 20 MG TABS tablet; Take 1 tablet (20 mg total) by mouth daily with supper.  Dispense: 30 tablet; Refill: 6  2. Hyperlipidemia LDL goal <70 Controlled Continue statin  3. Acute ischemic right MCA stroke [facility]) With residual right upper extremity weakness - Ambulatory referral to Physical Therapy  4. Essential hypertension Controlled - metoprolol tartrate (LOPRESSOR) 25 MG tablet; Take 0.5 tablets (12.5 mg total) by mouth 2 (two) times daily.  Dispense: 30 tablet; Refill: 6  5. Right arm weakness - Ambulatory referral to Physical Therapy - gabapentin (NEURONTIN) 300 MG capsule; Take 1 capsule (300 mg total) by mouth 2 (two) times daily.  Dispense: 60 capsule; Refill: 3 - methocarbamol (ROBAXIN) 500 MG tablet; Take 1 tablet (500 mg total) by mouth every 8 (eight) hours as needed for muscle spasms.  Dispense: 90 tablet; Refill: 1   Meds ordered this encounter  Medications  . gabapentin (NEURONTIN) 300 MG capsule    Sig: Take 1 capsule (300 mg total) by mouth 2 (two) times daily.    Dispense:  60 capsule    Refill:  3  . methocarbamol (ROBAXIN) 500 MG tablet    Sig: Take 1 tablet (500 mg total) by mouth every 8 (eight) hours as needed for muscle spasms.    Dispense:  90 tablet    Refill:  1  . atorvastatin (LIPITOR) 80 MG tablet    Sig: Take 1 tablet (80 mg total) by mouth daily at 6 PM.    Dispense:  30 tablet    Refill:  6  . diltiazem (CARDIZEM CD) 300 MG 24 hr capsule    Sig: Take 1 capsule (300 mg total) by mouth daily.  Dispense:  30 capsule    Refill:  6  . metoprolol tartrate (LOPRESSOR) 25 MG tablet    Sig: Take 0.5 tablets (12.5 mg total) by mouth 2 (two) times daily.    Dispense:  30 tablet     Refill:  6  . rivaroxaban (XARELTO) 20 MG TABS tablet    Sig: Take 1 tablet (20 mg total) by mouth daily with supper.    Dispense:  30 tablet    Refill:  6  . butalbital-acetaminophen-caffeine (FIORICET, ESGIC) 50-325-40 MG tablet    Sig: TAKE 1 TABLET BY MOUTH EVERY 12 HOURS AS NEEDED FOR HEADACHE.    Dispense:  40 tablet    Refill:  1    Follow-up: Return in about 3 months (around 07/15/2018) for Follow-up of chronic medical conditions.   [name] MD

## 2018-04-15 ENCOUNTER — Ambulatory Visit: Payer: BC Managed Care – PPO

## 2018-04-22 ENCOUNTER — Ambulatory Visit
Admission: RE | Admit: 2018-04-22 | Discharge: 2018-04-22 | Disposition: A | Discharge status: Home / Self Care | Payer: BC Managed Care – PPO | Source: Ambulatory Visit | Attending: Family Medicine | Admitting: Family Medicine

## 2018-04-22 DIAGNOSIS — Z1231 Encounter for screening mammogram for malignant neoplasm of breast: Secondary | ICD-10-CM

## 2018-04-23 ENCOUNTER — Other Ambulatory Visit: Payer: Self-pay | Admitting: Family Medicine

## 2018-04-23 DIAGNOSIS — R928 Other abnormal and inconclusive findings on diagnostic imaging of breast: Secondary | ICD-10-CM

## 2018-04-28 ENCOUNTER — Ambulatory Visit: Payer: BC Managed Care – PPO | Admitting: Physical Therapy

## 2018-04-29 ENCOUNTER — Ambulatory Visit: Payer: BC Managed Care – PPO | Admitting: Physical Therapy

## 2018-05-05 MED FILL — ATORVASTATIN 80 MG TABLET: 80 | 30 days supply | Qty: 30 | Fill #0

## 2018-05-05 MED FILL — XARELTO 20 MG TABLET: 20 | 30 days supply | Qty: 30 | Fill #2

## 2018-05-12 ENCOUNTER — Ambulatory Visit: Payer: BC Managed Care – PPO | Attending: Family Medicine | Admitting: Physical Therapy

## 2018-05-12 ENCOUNTER — Encounter: Payer: Self-pay | Admitting: Physical Therapy

## 2018-05-12 ENCOUNTER — Telehealth: Payer: Self-pay | Admitting: Physical Therapy

## 2018-05-12 ENCOUNTER — Other Ambulatory Visit: Payer: Self-pay

## 2018-05-12 DIAGNOSIS — R29898 Other symptoms and signs involving the musculoskeletal system: Secondary | ICD-10-CM

## 2018-05-12 DIAGNOSIS — M5412 Radiculopathy, cervical region: Secondary | ICD-10-CM

## 2018-05-12 DIAGNOSIS — M542 Cervicalgia: Secondary | ICD-10-CM

## 2018-05-12 DIAGNOSIS — I69354 Hemiplegia and hemiparesis following cerebral infarction affecting left non-dominant side: Secondary | ICD-10-CM | POA: Insufficient documentation

## 2018-05-12 NOTE — Telephone Encounter (Signed)
Dr. Margarita Rana,  Thank you for referring Ms. Blevens to Physical Therapy. I evaluated her today with the referral for RIGHT MCA CVA.   She presented with RIGHT upper extremity burning pain and weakness (which is consistent with your note of 04/14/18--although appears to have worsened since you saw her). This is not consistent with Rt MCA CVA (and according to hospital notes from May 2019, she had LEFT facial droop, LEFT facial tingling, and LEFT arm strength 4/5 with no deficits noted in right arm).   Of concern today is the fact that her right arm pain worsens with cervical compression and is lessened with cervical distraction. Her right shoulder flexion strength is now 2+/5 and she has begun to almost exclusively use her left arm for functional tasks, even though she is right-handed.   I recommended she may need further testing and that I would convey my concerns to you. After further diagnostics to establish the cause of her symptoms, she may benefit from Occupational Therapy to address her right arm deficits (if appropriate).  Please follow-up directly with Ms. Prosch for any further instructions you have for her. Please do not hesitate to contact me directly if you have any further questions for me.  Thank you again for this referral,  Barry Brunner, PT Outpatient Neurorehabilitation [address] [place], [place] [address] [phone]

## 2018-05-12 NOTE — Therapy (Signed)
[facility] [address] [place], [place], [address] Phone: [phone]   Fax:  [phone]  Physical Therapy Evaluation  Patient Details  Name: [name] MRN: [id number] Date of Birth: 07-Oct-1965 Referring Provider:  [name], MD   Encounter Date: 05/12/2018  PT End of Session - 05/12/18 1406    Visit Number  1    Number of Visits  1    Authorization Type  [employer]    PT Start Time  [phone]    PT Stop Time  [phone]    PT Time Calculation (min)  43 min    Activity Tolerance  Patient tolerated treatment well   did report incr RUE pain with cervical assessment   Behavior During Therapy  [facility] for tasks assessed/performed       Past Medical History:  Diagnosis Date  . Clotting disorder ([place])    on xarelto  . Essential hypertension   . Heart murmur    a. 10/2015 Echo: EF 60-65%, no rwma, mild AI/MR, sev dil LA/RA, PASP 53mmHg.  [name] Noncompliance   . NSVT (nonsustained ventricular tachycardia) ([place])    a. 10/2015 during admission for CVA/AF.  [name] Paroxysmal A-fib ([place])    a. CHA2DS2VASC = 4-->Xarelto;  b. 02/2016 Successful DCCV.  [name] Pneumonia 2012 x 2; 2015  . Stroke [facility])    a. 05/3902 Embolic CVA of mid right middle cerebral atery - recieved TPA-->small amt of asymptomatic hemorrhagic transformation.  . Transient ischemic attack (TIA) 01/2013    Past Surgical History:  Procedure Laterality Date  . CARDIOVERSION N/A 02/18/2013   Procedure: CARDIOVERSION;  Surgeon: Sanda Klein, MD;  Location: [facility] ENDOSCOPY;  Service: Cardiovascular;  Laterality: N/A;  . CARDIOVERSION N/A 02/23/2013   Procedure: CARDIOVERSION;  Surgeon: Leonie Man, MD;  Location: [place] [facility];  Service: Cardiovascular;  Laterality: N/A;  BESIDE CV  . CARDIOVERSION N/A 03/12/2016   Procedure: CARDIOVERSION;  Surgeon: Larey Dresser, MD;  Location: [facility];  Service: Cardiovascular;  Laterality: N/A;  . TEE WITHOUT CARDIOVERSION N/A  02/18/2013   Procedure: TRANSESOPHAGEAL ECHOCARDIOGRAM (TEE);  Surgeon: Sanda Klein, MD;  Location: [facility] ENDOSCOPY;  Service: Cardiovascular;  Laterality: N/A;  . TUBAL LIGATION  1992    There were no vitals filed for this visit.   Subjective Assessment - 05/12/18 0810    Subjective  After the second stroke (Feb 2017) I started having a burning sensation in my right arm. It comes and goes. But now it stays almost all the time. Reports she could not tolerate taking the gabapentin or robaxin for the pain due to making her too drowsy.     Pertinent History  clotting disorder, HTN, NSVT, afib/aflutter with cardioversions, TIA 01/2013 (rt facial droop); 10/2015 right temporoparietal infarct, Lt hemiplegia-tPA with resolution; 02/09/18 Rt MCA CVA (left facial droop, mild LUE weakness)    Diagnostic tests  MRI brain +rt MCA CVA    Patient Stated Goals  decrease pain in rt arm and improve ability to use her Rt arm    Currently in Pain?  Yes    Pain Score  5     Pain Location  Arm    Pain Orientation  Right    Pain Descriptors / Indicators  Throbbing;Burning    Pain Type  Neuropathic pain    Pain Radiating Towards  shoulder and across chest and up neck; never down arm    Pain Onset  More than a month ago   pt  reports after 10/2015 CVA; no records of reporting this to MDs   Pain Frequency  Constant    Aggravating Factors   reaching above shoulder height, reaching behind her back; trying to sleep prone     Pain Relieving Factors  gentle AROM; gabapentin made too sleepy    Effect of Pain on Daily Activities  limiting her ability to use RUE for ADLs and at work         [facility] PT Assessment - 05/12/18 0837      Assessment   Medical Diagnosis  Acute ischemic right MCA stroke     Referring Provider   [name], MD    Onset Date/Surgical Date  02/09/18    Hand Dominance  Right    Prior Therapy  acute PT and OT only      Precautions   Precautions  None      Restrictions   Weight Bearing  Restrictions  No      Balance Screen   Has the patient fallen in the past 6 months  No      Prior Function   Level of Independence  Independent    Vocation  Full time employment   [employer]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, stirring, serving--pt reports she has begun to do all these things with her LUE due to RUE pain and weakness      Cognition   Overall Cognitive Status  No family/caregiver present to determine baseline cognitive functioning   noted her recall of symptoms is different than medical record--no mention of RUE pain or weakness until 04/14/18 PCP visit     Observation/Other Assessments   Focus on Therapeutic Outcomes (FOTO)   not set up      Sensation   Light Touch  --   no change in LE; UEs not tested     Coordination   Gross Motor Movements are Fluid and Coordinated  No    Fine Motor Movements are Fluid and Coordinated  No    Coordination and Movement Description  bil LEs WNL; RUE weakness and pain limiting ability to perform tasks      ROM / Strength   AROM / PROM / Strength  AROM;Strength      AROM   Overall AROM   Deficits    Overall AROM Comments  bil LEs WNL    AROM Assessment Site  Shoulder;Cervical    Right/Left Shoulder  Right;Left    Right Shoulder Flexion  80 Degrees    Right Shoulder ABduction  80 Degrees    Left Shoulder Flexion  160 Degrees    Cervical Flexion  --   WNL   Cervical Extension  --   WNL   Cervical - Right Side Bend  30    Cervical - Left Side Bend  20    Cervical - Right Rotation  70    Cervical - Left Rotation  60      Strength   Overall Strength  Deficits    Overall Strength Comments  bil LEs WNL    Strength Assessment Site  Shoulder    Right/Left Shoulder  Right    Right Shoulder Flexion  2+/5    Right Shoulder ABduction  2+/5      Flexibility   Soft Tissue Assessment /Muscle Length  yes      Special Tests    Special Tests  Cervical    Cervical Tests  Spurling's;Dictraction;other       Spurling's  Findings  Positive    Side  Right    Comment  increases RUE pain/burning      Distraction Test   Findngs  Positive    side  Right    Comment  reduces RUE pain      other    Comment  supine palpation of cervical spine without misalignment; + tenderness and tightness suboccipital muscles and Rt upper trapezius      Bed Mobility   Bed Mobility  Supine to Sit;Sit to Supine    Supine to Sit  Independent    Sit to Supine  Independent      Transfers   Transfers  Sit to Stand;Stand to Sit    Sit to Stand  7: Independent    Stand to Sit  7: Independent      Ambulation/Gait   Ambulation/Gait  Yes    Ambulation/Gait Assistance  7: Independent    Ambulation Distance (Feet)  40 Feet   75, 80   Assistive device  None    Gait Pattern  Within Functional Limits    Ambulation Surface  Level;Indoor    Gait velocity  32.8/11.34=2.89 ft/sec   3.59 ft/sec norm for age; reports she walks slow afib    Stairs  Yes    Stairs Assistance  7: Independent    Stair Management Technique  No rails;Forwards;Alternating pattern    Number of Stairs  4    Height of Stairs  6      Functional Gait  Assessment   Gait assessed   Yes    Gait Level Surface  Walks 20 ft in less than 7 sec but greater than 5.5 sec, uses assistive device, slower speed, mild gait deviations, or deviates 6-10 in outside of the 12 in walkway width.    Change in Gait Speed  Able to change speed, demonstrates mild gait deviations, deviates 6-10 in outside of the 12 in walkway width, or no gait deviations, unable to achieve a major change in velocity, or uses a change in velocity, or uses an assistive device.    Gait with Horizontal Head Turns  Performs head turns smoothly with no change in gait. Deviates no more than 6 in outside 12 in walkway width    Gait with Vertical Head Turns  Performs head turns with no change in gait. Deviates no more than 6 in outside 12 in walkway width.    Gait and Pivot Turn  Pivot turns safely  within 3 sec and stops quickly with no loss of balance.    Step Over Obstacle  Is able to step over 2 stacked shoe boxes taped together (9 in total height) without changing gait speed. No evidence of imbalance.    Gait with Narrow Base of Support  Ambulates 4-7 steps.   6 steps   Gait with Eyes Closed  Walks 20 ft, uses assistive device, slower speed, mild gait deviations, deviates 6-10 in outside 12 in walkway width. Ambulates 20 ft in less than 9 sec but greater than 7 sec.   8.44 sec   Ambulating Backwards  Walks 20 ft, no assistive devices, good speed, no evidence for imbalance, normal gait    Steps  Alternating feet, no rail.    Total Score  25                Objective measurements completed on examination: See above findings.              PT Education - 05/12/18  [id number]    Education Details  results of PT assessment with mild decrease balance (not at incr fall risk); RUE weakness and pain not related to her CVA and will report findings to her MD who may want to do further testing; could benefit from OT to address ADLs and UE function    Person(s) Educated  Patient    Methods  Explanation    Comprehension  Verbalized understanding                  Plan - 05/12/18 1408    Clinical Impression Statement  Patient referred to OPPT by PCP on 04/14/18 due to right MCA CVA 02/09/18 . Of note, on admission to hospital in May, pt with LEFT facial droop, LEFT facial tingling, and LUE strength 4/5. When patient was evaluated by acute therapies (PT and OT) and seen in neurology office for CVA follow-up,  no recommendations were made for further therapies due to no significant deficits. She had no reported RUE pain or weakness at those times. Per PCP visit 04/14/18, pt presented with RIGHT arm pain and weakness. In today's interview, she reports this pain began after her CVA, but she attributes it to the CVA (although again, pt had RIGHT MCA CVA and had presented with LEFTsided  symptoms at time of CVA).  Today she is noted to have + RUE pain and weakness. She describes pain as burning and it increases with cervical compression and upper extremity neural tension test. Pain lessens with cervical distraction. Although pt has slightly impaired balance and gait (scored 25/30 on Functional Gait Assessment, with scores less than 22 indicative of increased fall risk), she is not concerned with her slight, infrequent feelings of imbalance and denies having falls or near falls. She is not interested in pursing further PT as she is seeking treatment for her RUE pain and weakness as it is impacting her ability to perform her ADLs and her job. Explained to pt that she may benefit from OT to address her RUE function, however feel she should have further testing to determine cause of weakness/pain as this is not consistent with the location of her CVA.    History and Personal Factors relevant to plan of care:  PMH-clotting disorder, HTN, NSVT, afib/aflutter with cardioversions, TIA 01/2013 (rt facial droop); 10/2015 right temporoparietal infarct, Lt hemiplegia-tPA with resolution    Clinical Presentation  Evolving    Clinical Presentation due to:  RUE weakness and pain appears to have quickly progressed from when seen by her PCP 04/14/18    Clinical Decision Making  Moderate    PT Frequency  One time visit    Recommended Other Services  follow-up with PCP re: cause of RUE pain and then potentially OT    Consulted and Agree with Plan of Care  Patient       Patient will benefit from skilled therapeutic intervention in order to improve the following deficits and impairments:  Other (comment)(no further PT at this time)  Visit Diagnosis: Hemiplegia and hemiparesis following cerebral infarction affecting left non-dominant side ([place]) - Plan: PT plan of care cert/re-cert     Problem List Patient Active Problem List   Diagnosis Date Noted  . Acute ischemic right MCA stroke ([place]) 02/08/2018   . Headache 01/29/2017  . Refractive errors 04/15/2016  . Medical non-compliance 11/06/2015  . Hyperlipidemia LDL goal <70 11/06/2015  . 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 NSVT (nonsustained ventricular tachycardia) ([place]) 11/06/2015  . History of CVA (cerebrovascular accident)   .  Claudication of both lower extremities ([place]) 03/22/2015  . Chronic atrial fibrillation ([place]) 04/12/2014  . Anticoagulation adequate, Xarelto started 04/12/2014  . Pap smear for cervical cancer screening 03/28/2014  . Cigarette smoker 06/15/2013  . Ventral hernia 06/15/2013  . Chronic anticoagulation 02/28/2013  . Hx-TIA (transient ischemic attack) 02/05/13 02/15/2013  . HTN (hypertension) 02/15/2013    Rexanne Mano, PT 05/12/2018, 2:28 PM  [facility] [address] [place], [place], [address] Phone: [phone]   Fax:  [phone]  Name: [name] MRN: [id number] Date of Birth: [birth year]

## 2018-05-13 NOTE — Telephone Encounter (Signed)
Patient was called and informed to get X-ray of neck done first then PCP will proceed with MRI

## 2018-05-13 NOTE — Telephone Encounter (Signed)
Could you please inform the patient to undergo a cervical neck x-ray first and then we can obtain a prior authorization and schedule an MRI of the neck due to her worsening right upper extremity weakness? Thank you.

## 2018-06-09 ENCOUNTER — Ambulatory Visit: Payer: BC Managed Care – PPO

## 2018-06-09 ENCOUNTER — Ambulatory Visit
Admission: RE | Admit: 2018-06-09 | Discharge: 2018-06-09 | Disposition: A | Discharge status: Home / Self Care | Payer: BC Managed Care – PPO | Source: Ambulatory Visit | Attending: Family Medicine | Admitting: Family Medicine

## 2018-06-09 DIAGNOSIS — R928 Other abnormal and inconclusive findings on diagnostic imaging of breast: Secondary | ICD-10-CM

## 2018-06-09 MED FILL — XARELTO 20 MG TABLET: 20 | 30 days supply | Qty: 30 | Fill #3

## 2018-06-10 ENCOUNTER — Ambulatory Visit: Payer: BC Managed Care – PPO

## 2018-06-10 ENCOUNTER — Telehealth: Payer: Self-pay | Admitting: Family Medicine

## 2018-06-10 NOTE — Telephone Encounter (Signed)
Patient call was returned and patient wanted to be seen by Dr. Margarita Rana today. Patient was informed that PCP will be out of office and was given the option to be scheduled with another provider. Patient was transferred to front desk to make appointment.

## 2018-06-10 NOTE — Telephone Encounter (Signed)
Patient called requesting a call back from her nurse, patient states she is having chest congestion and is afraid it might be pneumonia. Please follow up with patient.

## 2018-06-11 ENCOUNTER — Emergency Department (HOSPITAL_COMMUNITY): Payer: BC Managed Care – PPO

## 2018-06-11 ENCOUNTER — Encounter (HOSPITAL_COMMUNITY): Payer: Self-pay | Admitting: Emergency Medicine

## 2018-06-11 ENCOUNTER — Other Ambulatory Visit: Payer: Self-pay

## 2018-06-11 ENCOUNTER — Observation Stay (HOSPITAL_COMMUNITY)
Admission: EM | Admit: 2018-06-11 | Discharge: 2018-06-13 | Disposition: A | Discharge status: Home / Self Care | Payer: Self-pay | Attending: Internal Medicine | Admitting: Internal Medicine

## 2018-06-11 DIAGNOSIS — I482 Chronic atrial fibrillation, unspecified: Secondary | ICD-10-CM | POA: Diagnosis present

## 2018-06-11 DIAGNOSIS — Z8673 Personal history of transient ischemic attack (TIA), and cerebral infarction without residual deficits: Secondary | ICD-10-CM | POA: Insufficient documentation

## 2018-06-11 DIAGNOSIS — J189 Pneumonia, unspecified organism: Principal | ICD-10-CM

## 2018-06-11 DIAGNOSIS — Z881 Allergy status to other antibiotic agents status: Secondary | ICD-10-CM | POA: Insufficient documentation

## 2018-06-11 DIAGNOSIS — R0602 Shortness of breath: Secondary | ICD-10-CM

## 2018-06-11 DIAGNOSIS — Z880 Allergy status to penicillin: Secondary | ICD-10-CM | POA: Insufficient documentation

## 2018-06-11 DIAGNOSIS — Z87891 Personal history of nicotine dependence: Secondary | ICD-10-CM | POA: Insufficient documentation

## 2018-06-11 DIAGNOSIS — Z8249 Family history of ischemic heart disease and other diseases of the circulatory system: Secondary | ICD-10-CM | POA: Insufficient documentation

## 2018-06-11 DIAGNOSIS — Z79899 Other long term (current) drug therapy: Secondary | ICD-10-CM | POA: Insufficient documentation

## 2018-06-11 DIAGNOSIS — I10 Essential (primary) hypertension: Secondary | ICD-10-CM | POA: Diagnosis present

## 2018-06-11 DIAGNOSIS — Z7901 Long term (current) use of anticoagulants: Secondary | ICD-10-CM

## 2018-06-11 LAB — CBC WITH DIFFERENTIAL/PLATELET
Abs Immature Granulocytes: 0 10*3/uL (ref 0.0–0.1)
BASOS ABS: 0.1 10*3/uL (ref 0.0–0.1)
Basophils Relative: 0 %
EOS ABS: 0.1 10*3/uL (ref 0.0–0.7)
EOS PCT: 1 %
HEMATOCRIT: 37.6 % (ref 36.0–46.0)
Hemoglobin: 11.7 g/dL — ABNORMAL LOW (ref 12.0–15.0)
IMMATURE GRANULOCYTES: 0 %
LYMPHS ABS: 2.3 10*3/uL (ref 0.7–4.0)
Lymphocytes Relative: 19 %
MCH: 27.1 pg (ref 26.0–34.0)
MCHC: 31.1 g/dL (ref 30.0–36.0)
MCV: 87.2 fL (ref 78.0–100.0)
Monocytes Absolute: 0.7 10*3/uL (ref 0.1–1.0)
Monocytes Relative: 6 %
Neutro Abs: 9 10*3/uL — ABNORMAL HIGH (ref 1.7–7.7)
Neutrophils Relative %: 74 %
PLATELETS: 169 10*3/uL (ref 150–400)
RBC: 4.31 MIL/uL (ref 3.87–5.11)
RDW: 14.9 % (ref 11.5–15.5)
WBC: 12.2 10*3/uL — AB (ref 4.0–10.5)

## 2018-06-11 LAB — COMPREHENSIVE METABOLIC PANEL
ALT: 37 U/L (ref 0–44)
AST: 44 U/L — ABNORMAL HIGH (ref 15–41)
Albumin: 3.3 g/dL — ABNORMAL LOW (ref 3.5–5.0)
Alkaline Phosphatase: 94 U/L (ref 38–126)
Anion gap: 9 (ref 5–15)
BUN: 13 mg/dL (ref 6–20)
CHLORIDE: 107 mmol/L (ref 98–111)
CO2: 21 mmol/L — ABNORMAL LOW (ref 22–32)
CREATININE: 0.94 mg/dL (ref 0.44–1.00)
Calcium: 8.7 mg/dL — ABNORMAL LOW (ref 8.9–10.3)
Glucose, Bld: 126 mg/dL — ABNORMAL HIGH (ref 70–99)
Potassium: 3.7 mmol/L (ref 3.5–5.1)
SODIUM: 137 mmol/L (ref 135–145)
TOTAL PROTEIN: 6.8 g/dL (ref 6.5–8.1)
Total Bilirubin: 0.8 mg/dL (ref 0.3–1.2)

## 2018-06-11 LAB — I-STAT TROPONIN, ED: Troponin i, poc: 0 ng/mL (ref 0.00–0.08)

## 2018-06-11 LAB — I-STAT CG4 LACTIC ACID, ED: Lactic Acid, Venous: 0.8 mmol/L (ref 0.5–1.9)

## 2018-06-11 LAB — BRAIN NATRIURETIC PEPTIDE: B NATRIURETIC PEPTIDE 5: 287.9 pg/mL — AB (ref 0.0–100.0)

## 2018-06-11 LAB — STREP PNEUMONIAE URINARY ANTIGEN: Strep Pneumo Urinary Antigen: NEGATIVE

## 2018-06-11 MED ORDER — FUROSEMIDE 10 MG/ML IJ SOLN
20.0000 mg | Freq: Once | INTRAMUSCULAR | Status: AC
  Administered 2018-06-11: 20 mg via INTRAVENOUS
  Filled 2018-06-11: qty 2

## 2018-06-11 MED ORDER — PNEUMOCOCCAL VAC POLYVALENT 25 MCG/0.5ML IJ INJ
0.5000 mL | INJECTION | INTRAMUSCULAR | Status: DC
  Filled 2018-06-11: qty 0.5

## 2018-06-11 MED ORDER — BUTALBITAL-APAP-CAFFEINE 50-325-40 MG PO TABS: 1.0000 | ORAL_TABLET | Freq: Two times a day (BID) | ORAL | Status: DC | PRN

## 2018-06-11 MED ORDER — ATORVASTATIN CALCIUM 80 MG PO TABS
80.0000 mg | ORAL_TABLET | Freq: Every day | ORAL | Status: DC
  Administered 2018-06-11 – 2018-06-12 (×2): 80 mg via ORAL
  Filled 2018-06-11 (×2): qty 1

## 2018-06-11 MED ORDER — RIVAROXABAN 20 MG PO TABS
20.0000 mg | ORAL_TABLET | Freq: Every day | ORAL | Status: DC
  Administered 2018-06-11 – 2018-06-12 (×2): 20 mg via ORAL
  Filled 2018-06-11 (×2): qty 1

## 2018-06-11 MED ORDER — INFLUENZA VAC SPLIT QUAD 0.5 ML IM SUSY: 0.5000 mL | PREFILLED_SYRINGE | INTRAMUSCULAR | Status: DC

## 2018-06-11 MED ORDER — DOXYCYCLINE HYCLATE 100 MG PO TABS
100.0000 mg | ORAL_TABLET | Freq: Once | ORAL | Status: AC
  Administered 2018-06-11: 100 mg via ORAL
  Filled 2018-06-11: qty 1

## 2018-06-11 MED ORDER — METOPROLOL TARTRATE 25 MG PO TABS
25.0000 mg | ORAL_TABLET | Freq: Every day | ORAL | Status: DC
  Administered 2018-06-11 – 2018-06-12 (×2): 25 mg via ORAL
  Filled 2018-06-11 (×2): qty 1

## 2018-06-11 MED ORDER — DILTIAZEM HCL ER COATED BEADS 180 MG PO CP24
300.0000 mg | ORAL_CAPSULE | Freq: Every day | ORAL | Status: DC
  Administered 2018-06-11 – 2018-06-12 (×2): 300 mg via ORAL
  Filled 2018-06-11 (×2): qty 1

## 2018-06-11 MED ORDER — DOXYCYCLINE HYCLATE 100 MG PO TABS
100.0000 mg | ORAL_TABLET | Freq: Two times a day (BID) | ORAL | Status: DC
  Administered 2018-06-11 – 2018-06-12 (×4): 100 mg via ORAL
  Filled 2018-06-11 (×3): qty 1

## 2018-06-11 MED ORDER — ACETAMINOPHEN 500 MG PO TABS: 500.0000 mg | ORAL_TABLET | Freq: Four times a day (QID) | ORAL | Status: DC | PRN

## 2018-06-11 NOTE — ED Notes (Signed)
Patient transported to X-ray 

## 2018-06-11 NOTE — ED Notes (Signed)
Attempted to call report at this time.  Receiving RN unavailable.

## 2018-06-11 NOTE — ED Notes (Signed)
Ordered Pt a lunch tray.

## 2018-06-11 NOTE — H&P (Signed)
History and Physical    [name] TOI:[id number] DOB: 03-Jun-1966 DOA: 06/11/2018  PCP: [name], MD  Patient coming from: Home  Chief Complaint: SOB and cough  HPI: [name] is a 52 y.o. female with medical history significant of Afib, HTN, headaches who presents for SOB and cough.  [name] reports 1 day of sudden cough and SOB.  [name] had felt fluid "rattling in her chest" for a couple of days before that and knew [name] was getting pneumonia.  [name] reports that [name] had gotten this about 5 times in the past and [name] regularly will have Afib with RVR, pulmonary congestion and pneumonia.  [name] denies fever and chills.  [name] has had weakness of her right arm since her last stroke, but this is unchanged.  [name] other wise denies chest pain, nausea, vomiting, diarrhea, constipation, weight loss, missing her medications, recent illness, dysuria.    ED Course: In the ED, [name] was noted to have a new 2L oxygen requirement, her HR was elevated into the 120s after missing her morning medications.  CXR shows pulmonary vascular congestion with a possible consolidation that could be pneumonia.  [name] was tachycardic and started back on her home medications.    Review of Systems: As per HPI otherwise 10 point review of systems negative.   Past Medical History:  Diagnosis Date  . Clotting disorder ([place])    on xarelto  . Essential hypertension   . Heart murmur    a. 10/2015 Echo: EF 60-65%, no rwma, mild AI/MR, sev dil LA/RA, PASP 61mmHg.  [name] Noncompliance   . NSVT (nonsustained ventricular tachycardia) ([place])    a. 10/2015 during admission for CVA/AF.  [name] Paroxysmal A-fib ([place])    a. CHA2DS2VASC = 4-->Xarelto;  b. 02/2016 Successful DCCV.  [name] Pneumonia 2012 x 2; 2015  . Stroke [facility])    a. 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  . Transient ischemic attack (TIA) 01/2013    Past Surgical History:  Procedure Laterality  Date  . CARDIOVERSION N/A 02/18/2013   Procedure: CARDIOVERSION;  Surgeon: Sanda Klein, MD;  Location: [facility] ENDOSCOPY;  Service: Cardiovascular;  Laterality: N/A;  . CARDIOVERSION N/A 02/23/2013   Procedure: CARDIOVERSION;  Surgeon: Leonie Man, MD;  Location: [place];  Service: Cardiovascular;  Laterality: N/A;  BESIDE CV  . CARDIOVERSION N/A 03/12/2016   Procedure: CARDIOVERSION;  Surgeon: Larey Dresser, MD;  Location: [facility];  Service: Cardiovascular;  Laterality: N/A;  . TEE WITHOUT CARDIOVERSION N/A 02/18/2013   Procedure: TRANSESOPHAGEAL ECHOCARDIOGRAM (TEE);  Surgeon: Sanda Klein, MD;  Location: [place];  Service: Cardiovascular;  Laterality: N/A;  . [place] with patient.   reports that [name] quit smoking about 5 years ago. Her smoking use included cigarettes. [name] quit after 3.00 years of use. [name] has never used smokeless tobacco. [name] reports that [name] drinks alcohol. [name] reports that [name] does not use drugs.  Allergies  Allergen Reactions  . Penicillins Hives and Other (See Comments)    Unknown  Has patient had a PCN reaction causing immediate rash, facial/tongue/throat swelling, SOB or lightheadedness with hypotension: No Has patient had a PCN reaction causing severe rash involving mucus membranes or skin necrosis: No Has patient had a PCN reaction that required hospitalization No Has patient had a PCN reaction occurring within the last 10 years: No If all of the above answers are "NO", then may  proceed with Cephalosporin use.    Family History  Problem Relation Age of Onset  . Cancer Mother   . Atrial fibrillation Mother   . Breast cancer Mother   . Hypertension Father   . Atrial fibrillation Father   . Peripheral vascular disease Father   . Hypertension Unknown   . Diabetes Unknown   . Heart attack Neg Hx   . Stroke Neg Hx   . Rectal cancer Neg Hx   . Colon cancer Neg Hx   . Esophageal cancer Neg Hx   . Stomach cancer Neg Hx      Prior to Admission medications   Medication Sig Start Date End Date Taking? Authorizing Provider  acetaminophen (TYLENOL) 500 MG tablet Take 500 mg by mouth every 6 (six) hours as needed for headache.   Yes [provider]  atorvastatin (LIPITOR) 80 MG tablet Take 1 tablet (80 mg total) by mouth daily at 6 PM. 04/14/18  Yes Newlin, Enobong, MD  butalbital-acetaminophen-caffeine (FIORICET, ESGIC) 50-325-40 MG tablet TAKE 1 TABLET BY MOUTH EVERY 12 HOURS AS NEEDED FOR HEADACHE. Patient taking differently: Take 1 tablet by mouth every 12 (twelve) hours as needed for headache.  04/14/18  Yes Newlin, Enobong, MD  diltiazem (CARDIZEM CD) 300 MG 24 hr capsule Take 1 capsule (300 mg total) by mouth daily. 04/14/18 06/11/18 Yes [name], MD         metoprolol tartrate (LOPRESSOR) 25 MG tablet Take 0.5 tablets (12.5 mg total) by mouth 2 (two) times daily. Patient taking differently: Take 25 mg by mouth daily.  04/14/18 06/11/18 Yes [name], MD  rivaroxaban (XARELTO) 20 MG TABS tablet Take 1 tablet (20 mg total) by mouth daily with supper. 04/14/18  Yes [name], MD           Physical Exam: Constitutional: NAD, calm, comfortable, lying in bed Vitals:   06/11/18 1300 06/11/18 1315 06/11/18 1346 06/11/18 1404  BP: 136/83 (!) 135/98  137/82  Pulse: 67 (!) 57  (!) 105  Resp: (!) 25 19    Temp:    98.1 F (36.7 C)  TempSrc:    Oral  SpO2: 100% 100%  98%  Weight:   93.7 kg   Height:   5\' 3"  (1.6 m)    Eyes: lids and conjunctivae normal ENMT: Mucous membranes are moist.  Neck: normal, supple Respiratory: Crackles at bases, no rales, no wheezing Cardiovascular: Irreg Irreg, mildly tachycardic, no murmur Abdomen: +BS, NT, ND Musculoskeletal: no clubbing, normal muscle tone Skin: no rashes, lesions, ulcers on exposed skin Neurologic: Motor strength intact, sensation intact to light touch.  Psychiatric: Normal judgment and insight. Alert and oriented x 3. Normal mood.    Labs on Admission: I have personally reviewed following labs and imaging studies  CBC: Recent Labs  Lab 06/11/18 0735  WBC 12.2*  NEUTROABS 9.0*  HGB 11.7*  HCT 37.6  MCV 87.2  PLT 245   Basic Metabolic Panel: Recent Labs  Lab 06/11/18 0735  NA 137  K 3.7  CL 107  CO2 21*  GLUCOSE 126*  BUN 13  CREATININE 0.94  CALCIUM 8.7*   GFR: Estimated Creatinine Clearance: 74.4 mL/min (by C-G formula based on SCr of 0.94 mg/dL). Liver Function Tests: Recent Labs  Lab 06/11/18 0735  AST 44*  ALT 37  ALKPHOS 94  BILITOT 0.8  PROT 6.8  ALBUMIN 3.3*   No results for input(s): LIPASE, AMYLASE in the last 168 hours. No results for input(s): AMMONIA in  the last 168 hours. Coagulation Profile: No results for input(s): INR, PROTIME in the last 168 hours. Cardiac Enzymes: No results for input(s): CKTOTAL, CKMB, CKMBINDEX, TROPONINI in the last 168 hours. BNP (last 3 results) No results for input(s): PROBNP in the last 8760 hours. HbA1C: No results for input(s): HGBA1C in the last 72 hours. CBG: No results for input(s): GLUCAP in the last 168 hours. Lipid Profile: No results for input(s): CHOL, HDL, LDLCALC, TRIG, CHOLHDL, LDLDIRECT in the last 72 hours. Thyroid Function Tests: No results for input(s): TSH, T4TOTAL, FREET4, T3FREE, THYROIDAB in the last 72 hours. Anemia Panel: No results for input(s): VITAMINB12, FOLATE, FERRITIN, TIBC, IRON, RETICCTPCT in the last 72 hours. Urine analysis:    Component Value Date/Time   COLORURINE YELLOW 11/02/2015 1136   APPEARANCEUR CLEAR 11/02/2015 1136   LABSPEC 1.016 11/02/2015 1136   PHURINE 6.0 11/02/2015 1136   GLUCOSEU NEGATIVE 11/02/2015 1136   HGBUR NEGATIVE 11/02/2015 1136   BILIRUBINUR NEGATIVE 11/02/2015 1136   [place] 11/02/2015 [place] 11/02/2015 1136   UROBILINOGEN 1 03/22/2015 1301   NITRITE NEGATIVE 11/02/2015 1136   LEUKOCYTESUR SMALL (A) 11/02/2015 1136    Radiological Exams on  Admission: Dg Chest 2 View  Result Date: 06/11/2018 CLINICAL DATA:  Cough and shortness of breath EXAM: CHEST - 2 VIEW COMPARISON:  Feb 08, 2018 FINDINGS: Ill-defined opacity is noted in the right lower lung zone. Lungs elsewhere are clear. Heart is mildly enlarged with pulmonary venous hypertension. No evident adenopathy. No bone lesions. IMPRESSION: Pulmonary vascular congestion. Somewhat ill-defined opacity throughout portions of the right lower lung zone suggest potential underlying pneumonia in this area. Lungs elsewhere appear clear. No adenopathy evident. Electronically Signed   By: Lowella Grip III M.D.   On: 06/11/2018 08:34    EKG: Independently reviewed. Afib, rate 101  Assessment/Plan CAP (community acquired pneumonia) - CXR showed a probable pneumonia, WBC up to 12.2, cough and SOB - [name] is allergic to penicillin, so was started on doxycycline in the ED.  [name] has not had a fever.  Will check urinary antigens for strep pneumo and legionella  - Sputum culture - BC for fever > 101, or if patient does not do well - Oxygen to keep sats > 92% - Ambulation without O2 tomorrow to see if [name] will need home O2  HTN (hypertension) - Continue home diltiazem and metoprolol, monitor blood pressure.     Chronic atrial fibrillation (HCC) on Chronic anticoagulation - Patient went into mild RVR after missing AM medications including diltiazem and metoprolol - Continue oral medications - If not improved, would consider cardizem drip if needed - Troponin normal, [name] is not complaining of chest pain - Continue xarelto.      DVT prophylaxis: Xarelto Code Status: Full Disposition Plan: Admit for new oxygen requirement, assess for oxygen needs at home Consults called: none Admission status: Obs, telemetry X 12 hours   Gilles Chiquito MD [facility] Pager [phone]  If 7PM-7AM, please contact night-coverage [email or web address] Password [facility]  06/11/2018, 6:26 PM

## 2018-06-11 NOTE — Progress Notes (Signed)
New Admission Note:   Arrival Method: stretcher Mental Orientation: A&Ox4 Telemetry: applied box [id number] Assessment: Completed Skin: clean/dry/intact, skin assessment completed with [place], RN IV: Rt Hand Pain: denies Tubes: N/A Safety Measures: Safety Fall Prevention Plan has been given, discussed and signed Admission: Completed 5 [place] Orientation: Patient has been orientated to the room, unit and staff.  Family: not present at bedside  Orders have been reviewed and implemented. Will continue to monitor the patient. Call light has been placed within reach and bed alarm has been activated.   Chapman Fitch BSN, RN [employer] [facility] Phone number: [employer]

## 2018-06-11 NOTE — ED Notes (Addendum)
Pt placed on 2L [place] for sats of 90%

## 2018-06-11 NOTE — ED Triage Notes (Signed)
Pt here from home c/o SOB and feeling like she was going to pass out, pt reports "rattling" in her chest.  A&O x4

## 2018-06-11 NOTE — ED Provider Notes (Signed)
[facility] EMERGENCY DEPARTMENT Provider Note   CSN: [id number] Arrival date & time: 06/11/18  0703     History   Chief Complaint Chief Complaint  Patient presents with  . Shortness of Breath    HPI [name] is a 52 y.o. female.  The history is provided by the patient. No language interpreter was used.  Shortness of Breath    [name] is a 52 y.o. female who presents to the Emergency Department complaining of sob. She presents to the emergency department complaining of shortness of breath. Her symptoms began yesterday when she noted rattling in her chest with a nonproductive cough as well as dyspnea on exertion. Her symptoms are significantly worse than last night with worsening shortness of breath. She denies any fevers, hemoptysis, chest pain, abdominal pain, leg swelling or pain. She has similar symptoms in the past that she states is due to pneumonia. She does have a history of permanent atrial fibrillation, that tends to be rapid. She is on Xarelto and is compliant with medications. Past Medical History:  Diagnosis Date  . Clotting disorder ([place])    on xarelto  . Essential hypertension   . Heart murmur    a. 10/2015 Echo: EF 60-65%, no rwma, mild AI/MR, sev dil LA/RA, PASP 32mmHg.  [name] Noncompliance   . NSVT (nonsustained ventricular tachycardia) ([place])    a. 10/2015 during admission for CVA/AF.  [name] Paroxysmal A-fib ([place])    a. CHA2DS2VASC = 4-->Xarelto;  b. 02/2016 Successful DCCV.  [name] Pneumonia 2012 x 2; 2015  . Stroke [facility])    a. 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  . Transient ischemic attack (TIA) 01/2013    Patient Active Problem List   Diagnosis Date Noted  . CAP (community acquired pneumonia) 06/11/2018  . Acute ischemic right MCA stroke ([place]) 02/08/2018  . Headache 01/29/2017  . Refractive errors 04/15/2016  . Medical non-compliance 11/06/2015  .  Hyperlipidemia LDL goal <70 11/06/2015  . 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 NSVT (nonsustained ventricular tachycardia) ([place]) 11/06/2015  . History of CVA (cerebrovascular accident)   . 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 Chronic atrial fibrillation ([place]) 04/12/2014  . Anticoagulation adequate, Xarelto started 04/12/2014  . Pap smear for cervical cancer screening 03/28/2014  . Cigarette smoker 06/15/2013  . Ventral hernia 06/15/2013  . Chronic anticoagulation 02/28/2013  . Hx-TIA (transient ischemic attack) 02/05/13 02/15/2013  . HTN (hypertension) 02/15/2013    Past Surgical History:  Procedure Laterality Date  . CARDIOVERSION N/A 02/18/2013   Procedure: CARDIOVERSION;  Surgeon: Sanda Klein, MD;  Location: [place] ENDOSCOPY;  Service: Cardiovascular;  Laterality: N/A;  . CARDIOVERSION N/A 02/23/2013   Procedure: CARDIOVERSION;  Surgeon: Leonie Man, MD;  Location: [place];  Service: Cardiovascular;  Laterality: N/A;  BESIDE CV  . CARDIOVERSION N/A 03/12/2016   Procedure: CARDIOVERSION;  Surgeon: Larey Dresser, MD;  Location: [place];  Service: Cardiovascular;  Laterality: N/A;  . TEE WITHOUT CARDIOVERSION N/A 02/18/2013   Procedure: TRANSESOPHAGEAL ECHOCARDIOGRAM (TEE);  Surgeon: Sanda Klein, MD;  Location: [facility] ENDOSCOPY;  Service: Cardiovascular;  Laterality: N/A;  . TUBAL LIGATION  1992     OB History   None      Home Medications    Prior to Admission medications   Medication Sig Start Date End Date Taking? Authorizing Provider  acetaminophen (TYLENOL) 500 MG tablet Take 500 mg by mouth every 6 (  six) hours as needed for headache.   Yes [provider]  atorvastatin (LIPITOR) 80 MG tablet Take 1 tablet (80 mg total) by mouth daily at 6 PM. 04/14/18  Yes Newlin, Enobong, MD  butalbital-acetaminophen-caffeine (FIORICET, ESGIC) 50-325-40 MG tablet TAKE 1 TABLET BY MOUTH EVERY 12 HOURS AS NEEDED FOR HEADACHE. Patient taking differently:  Take 1 tablet by mouth every 12 (twelve) hours as needed for headache.  04/14/18  Yes Newlin, Enobong, MD  diltiazem (CARDIZEM CD) 300 MG 24 hr capsule Take 1 capsule (300 mg total) by mouth daily. 04/14/18 06/11/18 Yes [name], MD  gabapentin (NEURONTIN) 300 MG capsule Take 1 capsule (300 mg total) by mouth 2 (two) times daily. 04/14/18  Yes [name], MD  metoprolol tartrate (LOPRESSOR) 25 MG tablet Take 0.5 tablets (12.5 mg total) by mouth 2 (two) times daily. Patient taking differently: Take 25 mg by mouth daily.  04/14/18 06/11/18 Yes [name], MD  rivaroxaban (XARELTO) 20 MG TABS tablet Take 1 tablet (20 mg total) by mouth daily with supper. 04/14/18  Yes [name], MD  methocarbamol (ROBAXIN) 500 MG tablet Take 1 tablet (500 mg total) by mouth every 8 (eight) hours as needed for muscle spasms. Patient not taking: Reported on 05/12/2018 04/14/18   [name], MD    Family History Family History  Problem Relation Age of Onset  . Cancer Mother   . Atrial fibrillation Mother   . Breast cancer Mother   . Hypertension Father   . Atrial fibrillation Father   . Peripheral vascular disease Father   . Hypertension Unknown   . Diabetes Unknown   . Heart attack Neg Hx   . Stroke Neg Hx   . Rectal cancer Neg Hx   . Colon cancer Neg Hx   . Esophageal cancer Neg Hx   . Stomach cancer Neg Hx     Social History Social History   Tobacco Use  . Smoking status: Former Smoker    Years: 3.00    Types: Cigarettes    Last attempt to quit: 03/15/2013    Years since quitting: 5.2  . Smokeless tobacco: Never Used  Substance Use Topics  . Alcohol use: Yes    Comment: drinks on the weekends, 2 drinks and 2 beers  . Drug use: No     Allergies   Penicillins   Review of Systems Review of Systems  Respiratory: Positive for shortness of breath.   All other systems reviewed and are negative.    Physical Exam Updated Vital Signs BP 137/82   Pulse (!) 105   Temp  98.1 F (36.7 C) (Oral)   Resp 19   Ht 5\' 3"  (1.6 m)   Wt 93.7 kg   SpO2 98%   BMI 36.59 kg/m   Physical Exam  Constitutional: She is oriented to person, place, and time. She appears well-developed and well-nourished.  HENT:  Head: Normocephalic and atraumatic.  Cardiovascular:  No murmur heard. Tachycardic and irregular  Pulmonary/Chest: Effort normal and breath sounds normal. No respiratory distress.  Abdominal: Soft. There is no tenderness. There is no rebound and no guarding.  Musculoskeletal: She exhibits no tenderness.  Trace pitting edema to bilateral lower extremities  Neurological: She is alert and oriented to person, place, and time.  Skin: Skin is warm and dry.  Psychiatric: She has a normal mood and affect. Her behavior is normal.  Nursing note and vitals reviewed.    ED Treatments / Results  Labs (all labs ordered  are listed, but only abnormal results are displayed) Labs Reviewed  COMPREHENSIVE METABOLIC PANEL - Abnormal; Notable for the following components:      Result Value   CO2 21 (*)    Glucose, Bld 126 (*)    Calcium 8.7 (*)    Albumin 3.3 (*)    AST 44 (*)    All other components within normal limits  CBC WITH DIFFERENTIAL/PLATELET - Abnormal; Notable for the following components:   WBC 12.2 (*)    Hemoglobin 11.7 (*)    Neutro Abs 9.0 (*)    All other components within normal limits  BRAIN NATRIURETIC PEPTIDE - Abnormal; Notable for the following components:   B Natriuretic Peptide 287.9 (*)    All other components within normal limits  EXPECTORATED SPUTUM ASSESSMENT W REFEX TO RESP CULTURE  GRAM STAIN  HIV ANTIBODY (ROUTINE TESTING W REFLEX)  STREP PNEUMONIAE URINARY ANTIGEN  I-STAT TROPONIN, ED  I-STAT CG4 LACTIC ACID, ED    EKG EKG Interpretation  Date/Time:  Friday June 11 2018 07:35:51 EDT Ventricular Rate:  101 PR Interval:    QRS Duration: 85 QT Interval:  372 QTC Calculation: 483 R Axis:   -43 Text Interpretation:   Atrial fibrillation Paired ventricular premature complexes Left axis deviation Borderline T wave abnormalities Confirmed by Quintella Reichert [phone]) on 06/11/2018 8:01:01 AM   Radiology Dg Chest 2 View  Result Date: 06/11/2018 CLINICAL DATA:  Cough and shortness of breath EXAM: CHEST - 2 VIEW COMPARISON:  Feb 08, 2018 FINDINGS: Ill-defined opacity is noted in the right lower lung zone. Lungs elsewhere are clear. Heart is mildly enlarged with pulmonary venous hypertension. No evident adenopathy. No bone lesions. IMPRESSION: Pulmonary vascular congestion. Somewhat ill-defined opacity throughout portions of the right lower lung zone suggest potential underlying pneumonia in this area. Lungs elsewhere appear clear. No adenopathy evident. Electronically Signed   By: Lowella Grip III M.D.   On: 06/11/2018 08:34    Procedures Procedures (including critical care time)  Medications Ordered in ED Medications  diltiazem (CARDIZEM CD) 24 hr capsule 300 mg (300 mg Oral Given 06/11/18 1327)  metoprolol tartrate (LOPRESSOR) tablet 25 mg (25 mg Oral Given 06/11/18 1137)  acetaminophen (TYLENOL) tablet 500 mg (has no administration in time range)  butalbital-acetaminophen-caffeine (FIORICET, ESGIC) 50-325-40 MG per tablet 1 tablet (has no administration in time range)  atorvastatin (LIPITOR) tablet 80 mg (has no administration in time range)  rivaroxaban (XARELTO) tablet 20 mg (has no administration in time range)  doxycycline (VIBRA-TABS) tablet 100 mg (100 mg Oral Given by Other 06/11/18 1421)  Influenza vac split quadrivalent PF (FLUARIX) injection 0.5 mL (has no administration in time range)  doxycycline (VIBRA-TABS) tablet 100 mg (100 mg Oral Given 06/11/18 1138)  furosemide (LASIX) injection 20 mg (20 mg Intravenous Given 06/11/18 1139)     Initial Impression / Assessment and Plan / ED Course  I have reviewed the triage vital signs and the nursing notes.  Pertinent labs & imaging results that were  available during my care of the patient were reviewed by me and considered in my medical decision making (see chart for details).    Pt here for evaluation of cough and shortness of breath since yesterday. She is an persistent atrial fibrillation and anticoagulated. She is in atrial fibrillation on ED arrival with rates in the low 100s. She was given her home medications, which she missed. Chest x-ray concerning for pneumonia versus pulmonary edema. Examination and history are more consistent with CHF  and pulmonary edema. She is treated with Lasix. Will treat with antibiotics for possible community acquired pneumonia. She does have a new oxygen requirement. Plan to admit for Final Clinical Impressions(s) / ED Diagnoses   Final diagnoses:  None    ED Discharge Orders    None       Quintella Reichert, MD 06/11/18 1614

## 2018-06-12 ENCOUNTER — Observation Stay (HOSPITAL_COMMUNITY): Payer: BC Managed Care – PPO

## 2018-06-12 DIAGNOSIS — R0602 Shortness of breath: Secondary | ICD-10-CM

## 2018-06-12 LAB — BASIC METABOLIC PANEL
Anion gap: 8 (ref 5–15)
BUN: 14 mg/dL (ref 6–20)
CO2: 24 mmol/L (ref 22–32)
Calcium: 8.6 mg/dL — ABNORMAL LOW (ref 8.9–10.3)
Chloride: 106 mmol/L (ref 98–111)
Creatinine, Ser: 0.91 mg/dL (ref 0.44–1.00)
GFR calc non Af Amer: >60 mL/min (ref >60)
Glucose, Bld: 104 mg/dL — ABNORMAL HIGH (ref 70–99)
POTASSIUM: 3.7 mmol/L (ref 3.5–5.1)
SODIUM: 138 mmol/L (ref 135–145)

## 2018-06-12 LAB — CBC
HEMATOCRIT: 35.5 % — AB (ref 36.0–46.0)
HEMOGLOBIN: 11.1 g/dL — AB (ref 12.0–15.0)
MCH: 26.8 pg (ref 26.0–34.0)
MCHC: 31.3 g/dL (ref 30.0–36.0)
MCV: 85.7 fL (ref 78.0–100.0)
Platelets: 169 10*3/uL (ref 150–400)
RBC: 4.14 MIL/uL (ref 3.87–5.11)
RDW: 14.7 % (ref 11.5–15.5)
WBC: 10.8 10*3/uL — ABNORMAL HIGH (ref 4.0–10.5)

## 2018-06-12 LAB — HIV ANTIBODY (ROUTINE TESTING W REFLEX): HIV SCREEN 4TH GENERATION: NONREACTIVE

## 2018-06-12 MED ORDER — DILTIAZEM HCL 25 MG/5ML IV SOLN
10.0000 mg | Freq: Once | INTRAVENOUS | Status: DC
  Filled 2018-06-12: qty 5

## 2018-06-12 MED ORDER — DILTIAZEM HCL ER COATED BEADS 180 MG PO CP24
360.0000 mg | ORAL_CAPSULE | Freq: Every day | ORAL | Status: DC
  Administered 2018-06-13: 360 mg via ORAL
  Filled 2018-06-12: qty 2

## 2018-06-12 MED ORDER — DILTIAZEM HCL ER COATED BEADS 180 MG PO CP24: 360.0000 mg | ORAL_CAPSULE | Freq: Every day | ORAL | Status: DC

## 2018-06-12 NOTE — Progress Notes (Signed)
@IPLOG @        PROGRESS NOTE                                                                                                                                                                                                             Patient Demographics:    [name], is a 52 y.o. female, DOB - 26-Feb-1966, ZOX:[id number]  Admit date - 06/11/2018   Admitting Physician Sid Falcon, MD  Outpatient Primary MD for the patient is [name], MD  LOS - 0  Chief Complaint  Patient presents with  . Shortness of Breath       Brief Narrative  [name] is a 52 y.o. female with medical history significant of Afib, HTN, headaches who presents for SOB and cough.  [name] reports 1 day of sudden cough and SOB.  She had felt fluid "rattling in her chest" for a couple of days, the ER she was diagnosed with pneumonia along with A. fib RVR and admitted to the hospital.  She had reportedly missed her cardiac medications for a day.   Subjective:    [name] today has, No headache, No chest pain, No abdominal pain - No Nausea, No new weakness tingling or numbness, improved Cough - SOB.    Assessment  & Plan :     1. CAP - inconclusive chest x-ray, placed on doxycycline upon admission, clinically has responded, appears nontoxic, no sepsis.  Continue supportive care, increase activity if stable discharge in the morning.  2.  Chronic atrial fibrillation and RVR.  Mali vas 2 score of at least 3.  Cardizem dose increased, continue home dose beta-blocker, IV Cardizem push given, heart rate better continue to monitor, continue Xarelto for anticoagulation.  3. HTN - on diltiazem metoprolol combination will continue and monitor.    Family Communication  : None present  Code Status : Full  Disposition Plan  : Home tomorrow if in rate control  Consults  : None  Procedures  : None  DVT Prophylaxis  : Xarelto  Lab Results  Component Value Date   PLT 169 06/12/2018    Diet :   Diet Order            Diet Heart Room service appropriate? Yes; Fluid consistency: Thin  Diet effective now               Inpatient Medications Scheduled Meds: . atorvastatin  80 mg Oral q1800  . [START ON 06/13/2018] diltiazem  360 mg Oral Daily  .  diltiazem  10 mg Intravenous Once  . doxycycline  100 mg Oral Q12H  . Influenza vac split quadrivalent PF  0.5 mL Intramuscular Tomorrow-1000  . metoprolol tartrate  25 mg Oral Daily  . pneumococcal 23 valent vaccine  0.5 mL Intramuscular Tomorrow-1000  . rivaroxaban  20 mg Oral Q supper   Continuous Infusions: PRN Meds:.acetaminophen, butalbital-acetaminophen-caffeine  Antibiotics  :   Anti-infectives (From admission, onward)   Start     Dose/Rate Route Frequency Ordered Stop   06/11/18 1345  doxycycline (VIBRA-TABS) tablet 100 mg     100 mg Oral Every 12 hours 06/11/18 1341     06/11/18 1000  doxycycline (VIBRA-TABS) tablet 100 mg     100 mg Oral  Once 06/11/18 0947 06/11/18 1138          Objective:   Vitals:   06/11/18 1404 06/11/18 2120 06/12/18 0452 06/12/18 0829  BP: 137/82 (!) 116/91 123/83 125/86  Pulse: (!) 105 76 88 90  Resp:  18 16   Temp: 98.1 F (36.7 C) 98.6 F (37 C) 98.5 F (36.9 C)   TempSrc: Oral Oral    SpO2: 98% 99% 93%   Weight:   93.4 kg   Height:        Wt Readings from Last 3 Encounters:  06/12/18 93.4 kg  04/14/18 87.6 kg  03/15/18 87.5 kg     Intake/Output Summary (Last 24 hours) at 06/12/2018 1101 Last data filed at 06/11/2018 2121 Gross per 24 hour  Intake -  Output 200 ml  Net -200 ml     Physical Exam  Awake Alert, Oriented X 3, No new F.N deficits, Normal affect [place].AT,PERRAL Supple Neck,No JVD, No cervical lymphadenopathy appriciated.  Symmetrical Chest wall movement, Good air movement bilaterally, few rales iRRR,No Gallops,Rubs or new Murmurs, No Parasternal Heave +ve B.Sounds, Abd Soft, No tenderness, No organomegaly appriciated, No rebound - guarding or  rigidity. No Cyanosis, Clubbing or edema, No new Rash or bruise       Data Review:    CBC Recent Labs  Lab 06/11/18 0735 06/12/18 0325  WBC 12.2* 10.8*  HGB 11.7* 11.1*  HCT 37.6 35.5*  PLT 169 169  MCV 87.2 85.7  MCH 27.1 26.8  MCHC 31.1 31.3  RDW 14.9 14.7  LYMPHSABS 2.3  --   MONOABS 0.7  --   EOSABS 0.1  --   BASOSABS 0.1  --     Chemistries  Recent Labs  Lab 06/11/18 0735 06/12/18 0325  NA 137 138  K 3.7 3.7  CL 107 106  CO2 21* 24  GLUCOSE 126* 104*  BUN 13 14  CREATININE 0.94 0.91  CALCIUM 8.7* 8.6*  AST 44*  --   ALT 37  --   ALKPHOS 94  --   BILITOT 0.8  --    ------------------------------------------------------------------------------------------------------------------ No results for input(s): CHOL, HDL, LDLCALC, TRIG, CHOLHDL, LDLDIRECT in the last 72 hours.  Lab Results  Component Value Date   HGBA1C 6.1 (H) 02/09/2018   ------------------------------------------------------------------------------------------------------------------ No results for input(s): TSH, T4TOTAL, T3FREE, THYROIDAB in the last 72 hours.  Invalid input(s): FREET3 ------------------------------------------------------------------------------------------------------------------ No results for input(s): VITAMINB12, FOLATE, FERRITIN, TIBC, IRON, RETICCTPCT in the last 72 hours.  Coagulation profile No results for input(s): INR, PROTIME in the last 168 hours.  No results for input(s): DDIMER in the last 72 hours.  Cardiac Enzymes No results for input(s): CKMB, TROPONINI, MYOGLOBIN in the last 168 hours.  Invalid input(s): CK ------------------------------------------------------------------------------------------------------------------    Component  Value Date/Time   BNP 287.9 (H) 06/11/2018 5053    Micro Results No results found for this or any previous visit (from the past 240 hour(s)).  Radiology Reports Dg Chest 2 View  Result Date:  06/11/2018 CLINICAL DATA:  Cough and shortness of breath EXAM: CHEST - 2 VIEW COMPARISON:  Feb 08, 2018 FINDINGS: Ill-defined opacity is noted in the right lower lung zone. Lungs elsewhere are clear. Heart is mildly enlarged with pulmonary venous hypertension. No evident adenopathy. No bone lesions. IMPRESSION: Pulmonary vascular congestion. Somewhat ill-defined opacity throughout portions of the right lower lung zone suggest potential underlying pneumonia in this area. Lungs elsewhere appear clear. No adenopathy evident. Electronically Signed   By: Lowella Grip III M.D.   On: 06/11/2018 08:34   Dg Chest Port 1 View  Result Date: 06/12/2018 CLINICAL DATA:  Shortness of breath and cough EXAM: PORTABLE CHEST 1 VIEW COMPARISON:  June 11, 2018 FINDINGS: There is no edema or consolidation. Heart is mildly enlarged with pulmonary vascularity normal. No adenopathy. There is aortic atherosclerosis. No bone lesions. IMPRESSION: Mild cardiomegaly. No edema or consolidation. There is aortic atherosclerosis. Aortic Atherosclerosis (ICD10-I70.0). Electronically Signed   By: Lowella Grip III M.D.   On: 06/12/2018 09:14   Mm Diag Breast Tomo Uni Left  Result Date: 06/09/2018 CLINICAL DATA:  Patient returns today to evaluate a possible LEFT breast asymmetry questioned on recent screening mammogram. EXAM: DIGITAL DIAGNOSTIC UNILATERAL LEFT MAMMOGRAM WITH CAD AND TOMO COMPARISON:  Previous exams including recent screening mammogram dated 04/22/2018. ACR Breast Density Category b: There are scattered areas of fibroglandular density. FINDINGS: On today's additional views with 3D tomosynthesis, there is no persistent asymmetry within the LEFT breast indicating superimposition of normal fibroglandular tissues. Mammographic images were processed with CAD. IMPRESSION: No evidence of malignancy. Patient may return to routine annual bilateral screening mammogram schedule. RECOMMENDATION: Screening mammogram in one  year.(Code:[id number]) I have discussed the findings and recommendations with the patient. Results were also provided in writing at the conclusion of the visit. If applicable, a reminder letter will be sent to the patient regarding the next appointment. BI-RADS CATEGORY  1: Negative. Electronically Signed   By: Franki Cabot M.D.   On: 06/09/2018 11:18    Time Spent in minutes  30   Lala Lund M.D on 06/12/2018 at 11:01 AM  To page go to [email or web address] - password [facility]

## 2018-06-12 NOTE — Progress Notes (Signed)
SATURATION QUALIFICATIONS: (This note is used to comply with regulatory documentation for home oxygen)  Patient Saturations on Room Air at Rest = 98%  Patient Saturations on Room Air while Ambulating = 98%  Please briefly explain why patient needs home oxygen:

## 2018-06-12 NOTE — Discharge Instructions (Addendum)
Follow with Primary MD [name], MD in 7 days   Get CBC, CMP, 2 view Chest X ray checked  by Primary MD in 5-7 days    Activity: As tolerated with Full fall precautions use walker/cane & assistance as needed  Disposition Home   Diet: Heart Healthy    For Heart failure patients - Check your Weight same time everyday, if you gain over 2 pounds, or you develop in leg swelling, experience more shortness of breath or chest pain, call your Primary MD immediately. Follow Cardiac Low Salt Diet and 1.5 lit/day fluid restriction.  Special Instructions: If you have smoked or chewed Tobacco  in the last 2 yrs please stop smoking, stop any regular Alcohol  and or any Recreational drug use.  On your next visit with your primary care physician please Get Medicines reviewed and adjusted.  Please request your Prim.MD to go over all Hospital Tests and Procedure/Radiological results at the follow up, please get all Hospital records sent to your Prim MD by signing hospital release before you go home.  If you experience worsening of your admission symptoms, develop shortness of breath, life threatening emergency, suicidal or homicidal thoughts you must seek medical attention immediately by calling 911 or calling your MD immediately  if symptoms less severe.  You Must read complete instructions/literature along with all the possible adverse reactions/side effects for all the Medicines you take and that have been prescribed to you. Take any new Medicines after you have completely understood and accpet all the possible adverse reactions/side effects.    Information on my medicine - XARELTO (Rivaroxaban)  This medication education was reviewed with me or my healthcare representative as part of my discharge preparation.   Why was Xarelto prescribed for you? Xarelto was prescribed for you to reduce the risk of a blood clot forming that can cause a stroke if you have a medical condition called atrial  fibrillation (a type of irregular heartbeat).  What do you need to know about xarelto ? Take your Xarelto ONCE DAILY at the same time every day with your evening meal. If you have difficulty swallowing the tablet whole, you may crush it and mix in applesauce just prior to taking your dose.  Take Xarelto exactly as prescribed by your doctor and DO NOT stop taking Xarelto without talking to the doctor who prescribed the medication.  Stopping without other stroke prevention medication to take the place of Xarelto may increase your risk of developing a clot that causes a stroke.  Refill your prescription before you run out.  After discharge, you should have regular check-up appointments with your healthcare provider that is prescribing your Xarelto.  In the future your dose may need to be changed if your kidney function or weight changes by a significant amount.  What do you do if you miss a dose? If you are taking Xarelto ONCE DAILY and you miss a dose, take it as soon as you remember on the same day then continue your regularly scheduled once daily regimen the next day. Do not take two doses of Xarelto at the same time or on the same day.   Important Safety Information A possible side effect of Xarelto is bleeding. You should call your healthcare provider right away if you experience any of the following: ? Bleeding from an injury or your nose that does not stop. ? Unusual colored urine (red or dark brown) or unusual colored stools (red or black). ? Unusual bruising for unknown  reasons. ? A serious fall or if you hit your head (even if there is no bleeding).  Some medicines may interact with Xarelto and might increase your risk of bleeding while on Xarelto. To help avoid this, consult your healthcare provider or pharmacist prior to using any new prescription or non-prescription medications, including herbals, vitamins, non-steroidal anti-inflammatory drugs (NSAIDs) and  supplements.  This website has more information on Xarelto: [email or web address]

## 2018-06-13 MED ORDER — DOXYCYCLINE HYCLATE 100 MG PO TABS: 100.0000 mg | ORAL_TABLET | Freq: Two times a day (BID) | ORAL | 0 refills | Status: DC

## 2018-06-13 NOTE — Discharge Summary (Signed)
[name] MQK:[id number] DOB: Sep 21, 1965 DOA: 06/11/2018  PCP: [name], MD  Admit date: 06/11/2018  Discharge date: 06/13/2018  Admitted From: Home   Disposition:  Home   Recommendations for Outpatient Follow-up:   Follow up with PCP in 1-2 weeks  PCP Please obtain BMP/CBC, 2 view CXR in 1week,  (see Discharge instructions)   PCP Please follow up on the following pending results:    Home Health: None   Equipment/Devices: None  Consultations: None Discharge Condition: Stable   CODE STATUS: Full   Diet Recommendation: Heart Healthy   Chief Complaint  Patient presents with  . Shortness of Breath     Brief history of present illness from the day of admission and additional interim summary    [name] a 52 y.o.femalewith medical history significant ofAfib, HTN, headaches who presents for SOB and cough. [name] reports 1 day of sudden cough and SOB. She had felt fluid "rattling in her chest" for a couple of days, the ER she was diagnosed with pneumonia along with A. fib RVR and admitted to the hospital.  She had reportedly missed her cardiac medications for a day.                                                                 Hospital Course    1. CAP - inconclusive chest x-ray, placed on doxycycline upon admission, clinically has responded, appears nontoxic, no sepsis.    Now symptom-free and back to her baseline, no oxygen demand, will be discharged on 5 more days of oral doxycycline with outpatient PCP follow-up and monitoring.  2.  Chronic atrial fibrillation and RVR.  Mali vas 2 score of at least 3.  She missed her heart medications on the day of admission, counseled on compliance, received few extra doses of IV Cardizem, now back in rate control, home rate control medications  continued, continue Xarelto for anticoagulation, requested to follow with her cardiologist and PCP within a week of discharge.  3. HTN - on diltiazem metoprolol combination and stable.    Discharge diagnosis     Active Problems:   HTN (hypertension)   Chronic anticoagulation   Chronic atrial fibrillation (HCC)   CAP (community acquired pneumonia)    Discharge instructions    Discharge Instructions    Diet - low sodium heart healthy   Complete by:  As directed    Discharge instructions   Complete by:  As directed    Follow with Primary MD [name], MD in 7 days   Get CBC, CMP, 2 view Chest X ray checked  by Primary MD in 5-7 days    Activity: As tolerated with Full fall precautions use walker/cane & assistance as needed  Disposition Home   Diet: Heart Healthy  For Heart failure patients - Check your Weight same time everyday, if you gain over 2 pounds, or you develop in leg swelling, experience more shortness of breath or chest pain, call your Primary MD immediately. Follow Cardiac Low Salt Diet and 1.5 lit/day fluid restriction.  Special Instructions: If you have smoked or chewed Tobacco  in the last 2 yrs please stop smoking, stop any regular Alcohol  and or any Recreational drug use.  On your next visit with your primary care physician please Get Medicines reviewed and adjusted.  Please request your Prim.MD to go over all Hospital Tests and Procedure/Radiological results at the follow up, please get all Hospital records sent to your Prim MD by signing hospital release before you go home.  If you experience worsening of your admission symptoms, develop shortness of breath, life threatening emergency, suicidal or homicidal thoughts you must seek medical attention immediately by calling 911 or calling your MD immediately  if symptoms less severe.  You Must read complete instructions/literature along with all the possible adverse reactions/side effects for all the  Medicines you take and that have been prescribed to you. Take any new Medicines after you have completely understood and accpet all the possible adverse reactions/side effects.   Increase activity slowly   Complete by:  As directed       Discharge Medications   Allergies as of 06/13/2018      Reactions   Penicillins Hives, Other (See Comments)   Unknown Has patient had a PCN reaction causing immediate rash, facial/tongue/throat swelling, SOB or lightheadedness with hypotension: No Has patient had a PCN reaction causing severe rash involving mucus membranes or skin necrosis: No Has patient had a PCN reaction that required hospitalization No Has patient had a PCN reaction occurring within the last 10 years: No If all of the above answers are "NO", then may proceed with Cephalosporin use.      Medication List    TAKE these medications   acetaminophen 500 MG tablet Commonly known as:  TYLENOL Take 500 mg by mouth every 6 (six) hours as needed for headache.   atorvastatin 80 MG tablet Commonly known as:  LIPITOR Take 1 tablet (80 mg total) by mouth daily at 6 PM.   butalbital-acetaminophen-caffeine 50-325-40 MG tablet Commonly known as:  FIORICET, ESGIC TAKE 1 TABLET BY MOUTH EVERY 12 HOURS AS NEEDED FOR HEADACHE. What changed:    how much to take  how to take this  when to take this  reasons to take this  additional instructions   diltiazem 300 MG 24 hr capsule Commonly known as:  CARDIZEM CD Take 1 capsule (300 mg total) by mouth daily.   doxycycline 100 MG tablet Commonly known as:  VIBRA-TABS Take 1 tablet (100 mg total) by mouth every 12 (twelve) hours.   gabapentin 300 MG capsule Commonly known as:  NEURONTIN Take 1 capsule (300 mg total) by mouth 2 (two) times daily.   methocarbamol 500 MG tablet Commonly known as:  ROBAXIN Take 1 tablet (500 mg total) by mouth every 8 (eight) hours as needed for muscle spasms.   metoprolol tartrate 25 MG tablet Commonly  known as:  LOPRESSOR Take 0.5 tablets (12.5 mg total) by mouth 2 (two) times daily. What changed:    how much to take  when to take this   rivaroxaban 20 MG Tabs tablet Commonly known as:  XARELTO Take 1 tablet (20 mg total) by mouth daily with supper.  Follow-up Information    [name], MD. Schedule an appointment as soon as possible for a visit in 1 week(s).   Specialty:  Family Medicine Why:  and your cardiologist in 1 week Contact information: [address] [place] [address] [phone]           Major procedures and Radiology Reports - PLEASE review detailed and final reports thoroughly  -         Dg Chest 2 View  Result Date: 06/11/2018 CLINICAL DATA:  Cough and shortness of breath EXAM: CHEST - 2 VIEW COMPARISON:  Feb 08, 2018 FINDINGS: Ill-defined opacity is noted in the right lower lung zone. Lungs elsewhere are clear. Heart is mildly enlarged with pulmonary venous hypertension. No evident adenopathy. No bone lesions. IMPRESSION: Pulmonary vascular congestion. Somewhat ill-defined opacity throughout portions of the right lower lung zone suggest potential underlying pneumonia in this area. Lungs elsewhere appear clear. No adenopathy evident. Electronically Signed   By: Lowella Grip III M.D.   On: 06/11/2018 08:34   Dg Chest Port 1 View  Result Date: 06/12/2018 CLINICAL DATA:  Shortness of breath and cough EXAM: PORTABLE CHEST 1 VIEW COMPARISON:  June 11, 2018 FINDINGS: There is no edema or consolidation. Heart is mildly enlarged with pulmonary vascularity normal. No adenopathy. There is aortic atherosclerosis. No bone lesions. IMPRESSION: Mild cardiomegaly. No edema or consolidation. There is aortic atherosclerosis. Aortic Atherosclerosis (ICD10-I70.0). Electronically Signed   By: Lowella Grip III M.D.   On: 06/12/2018 09:14   Mm Diag Breast Tomo Uni Left  Result Date: 06/09/2018 CLINICAL DATA:  Patient returns today to  evaluate a possible LEFT breast asymmetry questioned on recent screening mammogram. EXAM: DIGITAL DIAGNOSTIC UNILATERAL LEFT MAMMOGRAM WITH CAD AND TOMO COMPARISON:  Previous exams including recent screening mammogram dated 04/22/2018. ACR Breast Density Category b: There are scattered areas of fibroglandular density. FINDINGS: On today's additional views with 3D tomosynthesis, there is no persistent asymmetry within the LEFT breast indicating superimposition of normal fibroglandular tissues. Mammographic images were processed with CAD. IMPRESSION: No evidence of malignancy. Patient may return to routine annual bilateral screening mammogram schedule. RECOMMENDATION: Screening mammogram in one year.(Code:[id number]) I have discussed the findings and recommendations with the patient. Results were also provided in writing at the conclusion of the visit. If applicable, a reminder letter will be sent to the patient regarding the next appointment. BI-RADS CATEGORY  1: Negative. Electronically Signed   By: Franki Cabot M.D.   On: 06/09/2018 11:18    Micro Results    No results found for this or any previous visit (from the past 240 hour(s)).  Today   Subjective    Contrina [place] today has no headache,no chest abdominal pain,no new weakness tingling or numbness, feels much better wants to go home today.     Objective   Blood pressure 137/88, pulse 72, temperature 98.2 F (36.8 C), resp. rate 18, height 5\' 3"  (1.6 m), weight 92.9 kg, SpO2 96 %.   Intake/Output Summary (Last 24 hours) at 06/13/2018 0849 Last data filed at 06/12/2018 1718 Gross per 24 hour  Intake 440 ml  Output 1200 ml  Net -760 ml    Exam Awake Alert, Oriented x 3, No new F.N deficits, Normal affect [place].AT,PERRAL Supple Neck,No JVD, No cervical lymphadenopathy appriciated.  Symmetrical Chest wall movement, Good air movement bilaterally, CTAB iRRR,No Gallops,Rubs or new Murmurs, No Parasternal Heave +ve B.Sounds, Abd Soft, Non  tender, No organomegaly appriciated, No rebound -guarding or rigidity. No  Cyanosis, Clubbing or edema, No new Rash or bruise   Data Review   CBC w Diff:  Lab Results  Component Value Date   WBC 10.8 (H) 06/12/2018   HGB 11.1 (L) 06/12/2018   HCT 35.5 (L) 06/12/2018   PLT 169 06/12/2018   LYMPHOPCT 19 06/11/2018   MONOPCT 6 06/11/2018   EOSPCT 1 06/11/2018   BASOPCT 0 06/11/2018    CMP:  Lab Results  Component Value Date   NA 138 06/12/2018   NA 143 03/03/2017   K 3.7 06/12/2018   CL 106 06/12/2018   CO2 24 06/12/2018   BUN 14 06/12/2018   BUN 11 03/03/2017   CREATININE 0.91 06/12/2018   CREATININE 0.88 03/07/2016   PROT 6.8 06/11/2018   PROT 7.5 03/03/2017   ALBUMIN 3.3 (L) 06/11/2018   ALBUMIN 4.1 03/03/2017   BILITOT 0.8 06/11/2018   BILITOT 0.4 03/03/2017   ALKPHOS 94 06/11/2018   AST 44 (H) 06/11/2018   ALT 37 06/11/2018  .   Total Time in preparing paper work, data evaluation and todays exam - 60 minutes  Lala Lund M.D on 06/13/2018 at 8:49 AM  [facility]   Office  [phone]

## 2018-06-14 MED FILL — DOXYCYCLINE HYCLATE 100 MG: 100 | 5 days supply | Qty: 10 | Fill #0

## 2018-06-16 ENCOUNTER — Inpatient Hospital Stay: Payer: BC Managed Care – PPO | Admitting: Family Medicine

## 2018-06-23 ENCOUNTER — Ambulatory Visit: Payer: BC Managed Care – PPO

## 2018-06-23 ENCOUNTER — Encounter: Payer: Self-pay | Admitting: Cardiology

## 2018-06-23 ENCOUNTER — Ambulatory Visit (INDEPENDENT_AMBULATORY_CARE_PROVIDER_SITE_OTHER): Payer: Self-pay | Admitting: Cardiology

## 2018-06-23 VITALS — BP 156/84 | HR 110 | Ht 63.5 in | Wt 202.8 lb

## 2018-06-23 DIAGNOSIS — I10 Essential (primary) hypertension: Secondary | ICD-10-CM

## 2018-06-23 DIAGNOSIS — I5032 Chronic diastolic (congestive) heart failure: Secondary | ICD-10-CM | POA: Insufficient documentation

## 2018-06-23 DIAGNOSIS — I5033 Acute on chronic diastolic (congestive) heart failure: Secondary | ICD-10-CM | POA: Insufficient documentation

## 2018-06-23 DIAGNOSIS — R5383 Other fatigue: Secondary | ICD-10-CM

## 2018-06-23 DIAGNOSIS — G4733 Obstructive sleep apnea (adult) (pediatric): Secondary | ICD-10-CM | POA: Insufficient documentation

## 2018-06-23 DIAGNOSIS — Z7901 Long term (current) use of anticoagulants: Secondary | ICD-10-CM

## 2018-06-23 DIAGNOSIS — I5031 Acute diastolic (congestive) heart failure: Secondary | ICD-10-CM

## 2018-06-23 DIAGNOSIS — Z8673 Personal history of transient ischemic attack (TIA), and cerebral infarction without residual deficits: Secondary | ICD-10-CM

## 2018-06-23 DIAGNOSIS — Z9989 Dependence on other enabling machines and devices: Secondary | ICD-10-CM

## 2018-06-23 DIAGNOSIS — R0683 Snoring: Secondary | ICD-10-CM

## 2018-06-23 DIAGNOSIS — I482 Chronic atrial fibrillation, unspecified: Secondary | ICD-10-CM

## 2018-06-23 MED ORDER — HYDROCHLOROTHIAZIDE 12.5 MG PO CAPS: 12.5000 mg | ORAL_CAPSULE | Freq: Every day | ORAL | 3 refills | Status: DC

## 2018-06-23 MED FILL — HYDROCHLOROTHIAZIDE 12.5 MG: 12.5 | 30 days supply | Qty: 30 | Fill #0

## 2018-06-23 NOTE — Assessment & Plan Note (Signed)
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 

## 2018-06-23 NOTE — Assessment & Plan Note (Signed)
Sub optimal control 

## 2018-06-23 NOTE — Progress Notes (Signed)
Thank you MCr 

## 2018-06-23 NOTE — Patient Instructions (Signed)
Medication Instructions:  START- Hydrochlorothiazide 12.5 mg daily  If you need a refill on your cardiac medications before your next appointment, please call your pharmacy.  Labwork: None Ordered   Testing/Procedures: Your physician has recommended that you have a sleep study. This test records several body functions during sleep, including: brain activity, eye movement, oxygen and carbon dioxide blood levels, heart rate and rhythm, breathing rate and rhythm, the flow of air through your mouth and nose, snoring, body muscle movements, and chest and belly movement.    Special Instructions:  Low-Sodium Eating Plan Sodium, which is an element that makes up salt, helps you maintain a healthy balance of fluids in your body. Too much sodium can increase your blood pressure and cause fluid and waste to be held in your body. Your health care provider or dietitian may recommend following this plan if you have high blood pressure (hypertension), kidney disease, liver disease, or heart failure. Eating less sodium can help lower your blood pressure, reduce swelling, and protect your heart, liver, and kidneys. What are tips for following this plan? General guidelines  Most people on this plan should limit their sodium intake to 1,500-2,000 mg (milligrams) of sodium each day. Reading food labels  The Nutrition Facts label lists the amount of sodium in one serving of the food. If you eat more than one serving, you must multiply the listed amount of sodium by the number of servings.  Choose foods with less than 140 mg of sodium per serving.  Avoid foods with 300 mg of sodium or more per serving. Shopping  Look for lower-sodium products, often labeled as "low-sodium" or "no salt added."  Always check the sodium content even if foods are labeled as "unsalted" or "no salt added".  Buy fresh foods. ? Avoid canned foods and premade or frozen meals. ? Avoid canned, cured, or processed meats  Buy  breads that have less than 80 mg of sodium per slice. Cooking  Eat more home-cooked food and less restaurant, buffet, and fast food.  Avoid adding salt when cooking. Use salt-free seasonings or herbs instead of table salt or sea salt. Check with your health care provider or pharmacist before using salt substitutes.  [place] with plant-based oils, such as canola, sunflower, or olive oil. Meal planning  When eating at a restaurant, ask that your food be prepared with less salt or no salt, if possible.  Avoid foods that contain MSG (monosodium glutamate). MSG is sometimes added to [place] food, bouillon, and some canned foods. What foods are recommended? The items listed may not be a complete list. Talk with your dietitian about what dietary choices are best for you. Grains Low-sodium cereals, including oats, puffed wheat and rice, and shredded wheat. Low-sodium crackers. Unsalted rice. Unsalted pasta. Low-sodium bread. Whole-grain breads and whole-grain pasta. Vegetables Fresh or frozen vegetables. "No salt added" canned vegetables. "No salt added" tomato sauce and paste. Low-sodium or reduced-sodium tomato and vegetable juice. Fruits Fresh, frozen, or canned fruit. Fruit juice. Meats and other protein foods Fresh or frozen (no salt added) meat, poultry, seafood, and fish. Low-sodium canned tuna and salmon. Unsalted nuts. Dried peas, beans, and lentils without added salt. Unsalted canned beans. Eggs. Unsalted nut butters. Dairy Milk. Soy milk. Cheese that is naturally low in sodium, such as ricotta cheese, fresh mozzarella, or Swiss cheese Low-sodium or reduced-sodium cheese. Cream cheese. Yogurt. Fats and oils Unsalted butter. Unsalted margarine with no trans fat. Vegetable oils such as canola or olive oils. Seasonings and  other foods Fresh and dried herbs and spices. Salt-free seasonings. Low-sodium mustard and ketchup. Sodium-free salad dressing. Sodium-free light mayonnaise. Fresh or  refrigerated horseradish. Lemon juice. Vinegar. Homemade, reduced-sodium, or low-sodium soups. Unsalted popcorn and pretzels. Low-salt or salt-free chips. What foods are not recommended? The items listed may not be a complete list. Talk with your dietitian about what dietary choices are best for you. Grains Instant hot cereals. Bread stuffing, pancake, and biscuit mixes. Croutons. Seasoned rice or pasta mixes. Noodle soup cups. Boxed or frozen macaroni and cheese. Regular salted crackers. Self-rising flour. Vegetables Sauerkraut, pickled vegetables, and relishes. Olives. [place] fries. Onion rings. Regular canned vegetables (not low-sodium or reduced-sodium). Regular canned tomato sauce and paste (not low-sodium or reduced-sodium). Regular tomato and vegetable juice (not low-sodium or reduced-sodium). Frozen vegetables in sauces. Meats and other protein foods Meat or fish that is salted, canned, smoked, spiced, or pickled. Bacon, ham, sausage, hotdogs, corned beef, chipped beef, packaged lunch meats, salt pork, jerky, pickled herring, anchovies, regular canned tuna, sardines, salted nuts. Dairy Processed cheese and cheese spreads. Cheese curds. Blue cheese. Feta cheese. String cheese. Regular cottage cheese. Buttermilk. Canned milk. Fats and oils Salted butter. Regular margarine. Ghee. Bacon fat. Seasonings and other foods Onion salt, garlic salt, seasoned salt, table salt, and sea salt. Canned and packaged gravies. Worcestershire sauce. Tartar sauce. Barbecue sauce. Teriyaki sauce. Soy sauce, including reduced-sodium. Steak sauce. Fish sauce. Oyster sauce. Cocktail sauce. Horseradish that you find on the shelf. Regular ketchup and mustard. Meat flavorings and tenderizers. Bouillon cubes. Hot sauce and Tabasco sauce. Premade or packaged marinades. Premade or packaged taco seasonings. Relishes. Regular salad dressings. Salsa. Potato and tortilla chips. Corn chips and puffs. Salted popcorn and pretzels.  Canned or dried soups. Pizza. Frozen entrees and pot pies. Summary  Eating less sodium can help lower your blood pressure, reduce swelling, and protect your heart, liver, and kidneys.  Most people on this plan should limit their sodium intake to 1,500-2,000 mg (milligrams) of sodium each day.  Canned, boxed, and frozen foods are high in sodium. Restaurant foods, fast foods, and pizza are also very high in sodium. You also get sodium by adding salt to food.  Try to [place] at home, eat more fresh fruits and vegetables, and eat less fast food, canned, processed, or prepared foods. This information is not intended to replace advice given to you by your health care provider. Make sure you discuss any questions you have with your health care provider. Document Released: 02/21/2002 Document Revised: 08/25/2016 Document Reviewed: 08/25/2016 Elsevier Interactive Patient Education  2018 [place]: At [facility], you and your health needs are our priority.  As part of our continuing mission to provide you with exceptional heart care, we have created designated Provider Care Teams.  These Care Teams include your primary Cardiologist (physician) and Advanced Practice Providers (APPs -  Physician Assistants and Nurse Practitioners) who all work together to provide you with the care you need, when you need it.  You will need a follow up appointment in 2 Months with Kerin Ransom.       Thank you for choosing [facility] at [facility]!!

## 2018-06-23 NOTE — Assessment & Plan Note (Signed)
Pt has symptoms and signs of sleep apnea- she snores, her SO says she stops breathing at night, she has AF, HTN and obesity- BMI 35

## 2018-06-23 NOTE — Assessment & Plan Note (Addendum)
Pt admitted 9/27-9/29/19 with SOB- I suspect this was diastolic CHF secondary to poorly controlled HTN, AF with RVR, sodium intake, and sleep apnea. I think she is still volume overloaded

## 2018-06-23 NOTE — Progress Notes (Signed)
hctz

## 2018-06-23 NOTE — Progress Notes (Signed)
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 Fortunately she has no residual effects. She had DCCV in June 2017 but failed to hold NSR. She has been on Xarelto since. She has bi atrial enlargement and Dr Sallyanne Kuster does not think an anti arrhythmic would be effective. In May 2019 she had a recurrent CVA after she missed a few days of Xarelto secondary to cost. She again recovered without residual. She is in the office today after a recent admission for acute respiratory failure felt to be a combination of COPD exacerbation and diastolic CHF.  Since discharge she is better though she still thinks her weight is 4 lbs above her baseline. She denies orthopnea or LE edema.    Current Outpatient Medications  Medication Sig Dispense Refill  . acetaminophen (TYLENOL) 500 MG tablet Take 500 mg by mouth every 6 (six) hours as needed for headache.    [name] atorvastatin (LIPITOR) 80 MG tablet Take 1 tablet (80 mg total) by mouth daily at 6 PM. 30 tablet 6  . butalbital-acetaminophen-caffeine (FIORICET, ESGIC) 50-325-40 MG tablet TAKE 1 TABLET BY MOUTH EVERY 12 HOURS AS NEEDED FOR HEADACHE. (Patient taking differently: Take 1 tablet by mouth every 12 (twelve) hours as needed for headache. ) 40 tablet 1  . doxycycline (VIBRA-TABS) 100 MG tablet Take 1 tablet (100 mg total) by mouth every 12 (twelve) hours. 10 tablet 0  . gabapentin (NEURONTIN) 300 MG capsule Take 1 capsule (300 mg total) by mouth 2 (two) times daily. 60 capsule 3  . methocarbamol (ROBAXIN) 500 MG tablet Take 1 tablet (500 mg total) by mouth every 8 (eight) hours as needed for muscle spasms. 90 tablet 1  . rivaroxaban (XARELTO) 20 MG TABS tablet Take 1 tablet (20 mg total) by mouth daily with supper. 30 tablet 6  . diltiazem (CARDIZEM CD) 300 MG 24 hr capsule Take 1 capsule (300  mg total) by mouth daily. 30 capsule 6  . hydrochlorothiazide (MICROZIDE) 12.5 MG capsule Take 1 capsule (12.5 mg total) by mouth daily. 90 capsule 3  . metoprolol tartrate (LOPRESSOR) 25 MG tablet Take 0.5 tablets (12.5 mg total) by mouth 2 (two) times daily. (Patient taking differently: Take 25 mg by mouth daily. ) 30 tablet 6   Current Facility-Administered Medications  Medication Dose Route Frequency Provider Last Rate Last Dose  . 0.9 %  sodium chloride infusion  500 mL Intravenous Once Armbruster, Carlota Raspberry, MD        Allergies  Allergen Reactions  . Penicillins Hives and Other (See Comments)    Unknown  Has patient had a PCN reaction causing immediate rash, facial/tongue/throat swelling, SOB or lightheadedness with hypotension: No Has patient had a PCN reaction causing severe rash involving mucus membranes or skin necrosis: No Has patient had a PCN reaction that required hospitalization No Has patient had a PCN reaction occurring within the last 10 years: No If all of the above answers are "NO", then may proceed with Cephalosporin use.    Past Medical History:  Diagnosis Date  . Clotting disorder ([place])    on xarelto  . Essential hypertension   . Heart murmur    a. 10/2015 Echo: EF 60-65%, no rwma, mild AI/MR, sev dil LA/RA, PASP 5mmHg.  [name] Noncompliance   . NSVT (nonsustained ventricular tachycardia) ([place])  a. 10/2015 during admission for CVA/AF.  [name] Paroxysmal A-fib ([place])    a. CHA2DS2VASC = 4-->Xarelto;  b. 02/2016 Successful DCCV.  [name] Pneumonia 2012 x 2; 2015  . Stroke [facility])    a. 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  . Transient ischemic attack (TIA) 01/2013    Social History   Socioeconomic History  . Marital status: Single    Spouse name: Not on file  . Number of children: Not on file  . Years of education: Not on file  . Highest education level: Not on file  Occupational  History  . Occupation: Airline pilot: [employer]  . Occupation: in home health aide  Social Needs  . Financial resource strain: Not on file  . Food insecurity:    Worry: Not on file    Inability: Not on file  . Transportation needs:    Medical: Not on file    Non-medical: Not on file  Tobacco Use  . Smoking status: Former Smoker    Years: 3.00    Types: Cigarettes    Last attempt to quit: 03/15/2013    Years since quitting: 5.2  . Smokeless tobacco: Never Used  Substance and Sexual Activity  . Alcohol use: Yes    Comment: drinks on the weekends, 2 drinks and 2 beers  . Drug use: No  . Sexual activity: Not Currently    Birth control/protection: None  Lifestyle  . Physical activity:    Days per week: Not on file    Minutes per session: Not on file  . Stress: Not on file  Relationships  . Social connections:    Talks on phone: Not on file    Gets together: Not on file    Attends religious service: Not on file    Active member of club or organization: Not on file    Attends meetings of clubs or organizations: Not on file    Relationship status: Not on file  . Intimate partner violence:    Fear of current or ex partner: Not on file    Emotionally abused: Not on file    Physically abused: Not on file    Forced sexual activity: Not on file  Other Topics Concern  . Not on file  Social History Narrative  . Not on file     Family History  Problem Relation Age of Onset  . Cancer Mother   . Atrial fibrillation Mother   . Breast cancer Mother   . Hypertension Father   . Atrial fibrillation Father   . Peripheral vascular disease Father   . Hypertension Unknown   . Diabetes Unknown   . Heart attack Neg Hx   . Stroke Neg Hx   . Rectal cancer Neg Hx   . Colon cancer Neg Hx   . Esophageal cancer Neg Hx   . Stomach cancer Neg Hx      Review of Systems: General: negative for chills, fever, night sweats or weight changes.  Cardiovascular: negative for  chest pain, dyspnea on exertion, edema, orthopnea, palpitations, paroxysmal nocturnal dyspnea or shortness of breath Dermatological: negative for rash Respiratory: negative for cough or wheezing Urologic: negative for hematuria Abdominal: negative for nausea, vomiting, diarrhea, bright red blood per rectum, melena, or hematemesis Neurologic: negative for visual changes, syncope, or dizziness All other systems reviewed and are otherwise negative except as noted above.    Blood pressure [name])  156/84, pulse (!) 110, height 5' 3.5" (1.613 m), weight 202 lb 12.8 oz (92 kg).  General appearance: alert, cooperative, no distress and moderately obese Neck: no carotid bruit and JVD to jaw angle Lungs: basilar crackles on Lt Heart: irregularly irregular rhythm Extremities: no edema Skin: warm and dry Neurologic: Grossly normal  EKG AF with VR 110  ASSESSMENT AND PLAN:   Acute diastolic CHF (congestive heart failure) ([place]) Pt admitted 9/27-9/29/19 with SOB- I suspect this was diastolic CHF secondary to poorly controlled HTN, AF with RVR, sodium intake, and sleep apnea. I think she is still volume overloaded  Chronic atrial fibrillation [facility]) DCCV June 2017. Bi atrial enlargement, probably not a candidate for antiarrythmic   Chronic anticoagulation CHADS VASC=5. She is on Xarelto  History of CVA (cerebrovascular accident) CVA Feb 2017 due to embolism of right middle cerebral artery ([place]) s/p IV tPA Recurrent CVA May 2019 after she missed Xarelto 4-5 days ($)  HTN (hypertension) Sub optimal control  Sleep apnea Pt has symptoms and signs of sleep apnea- she snores, her SO says she stops breathing at night, she has AF, HTN and obesity- BMI 35   PLAN  I added HCTZ 12.5 mg daily. We discussed low sodium diet. She has not taken her medications yet today and I suspect her HR is generally better controlled. I suggested a sleep study. I would like to see her back in 1-2 months.   Kerin Ransom  PA-C 06/23/2018 8:40 AM

## 2018-06-23 NOTE — Assessment & Plan Note (Signed)
CHADS VASC=5. She is on Xarelto 

## 2018-06-23 NOTE — Assessment & Plan Note (Signed)
DCCV June 2017. Bi atrial enlargement, probably not a candidate for antiarrythmic  

## 2018-06-25 ENCOUNTER — Telehealth: Payer: Self-pay | Admitting: *Deleted

## 2018-06-25 NOTE — Telephone Encounter (Signed)
-----   Message from Harold Hedge, [place] sent at 06/24/2018 11:58 AM EDT ----- Regarding: needs sleep study Sleep study has been ordered.  Thanks,   [employer]

## 2018-06-25 NOTE — Telephone Encounter (Signed)
Patient notified of in lab sleep study appointment scheduled on 08/04/18.

## 2018-06-26 NOTE — Progress Notes (Signed)
Thanks MCr 

## 2018-06-29 ENCOUNTER — Encounter: Payer: Self-pay | Admitting: Family Medicine

## 2018-06-29 ENCOUNTER — Ambulatory Visit: Payer: Self-pay | Attending: Family Medicine | Admitting: Family Medicine

## 2018-06-29 VITALS — BP 151/94 | HR 88 | Temp 97.6°F | Ht 63.5 in | Wt 202.6 lb

## 2018-06-29 DIAGNOSIS — Z7901 Long term (current) use of anticoagulants: Secondary | ICD-10-CM | POA: Insufficient documentation

## 2018-06-29 DIAGNOSIS — Z79899 Other long term (current) drug therapy: Secondary | ICD-10-CM | POA: Insufficient documentation

## 2018-06-29 DIAGNOSIS — M7501 Adhesive capsulitis of right shoulder: Secondary | ICD-10-CM | POA: Insufficient documentation

## 2018-06-29 DIAGNOSIS — I110 Hypertensive heart disease with heart failure: Secondary | ICD-10-CM | POA: Insufficient documentation

## 2018-06-29 DIAGNOSIS — E785 Hyperlipidemia, unspecified: Secondary | ICD-10-CM | POA: Insufficient documentation

## 2018-06-29 DIAGNOSIS — Z23 Encounter for immunization: Secondary | ICD-10-CM | POA: Insufficient documentation

## 2018-06-29 DIAGNOSIS — Z8673 Personal history of transient ischemic attack (TIA), and cerebral infarction without residual deficits: Secondary | ICD-10-CM | POA: Insufficient documentation

## 2018-06-29 DIAGNOSIS — I482 Chronic atrial fibrillation, unspecified: Secondary | ICD-10-CM | POA: Insufficient documentation

## 2018-06-29 DIAGNOSIS — I5032 Chronic diastolic (congestive) heart failure: Secondary | ICD-10-CM | POA: Insufficient documentation

## 2018-06-29 DIAGNOSIS — Z9889 Other specified postprocedural states: Secondary | ICD-10-CM | POA: Insufficient documentation

## 2018-06-29 DIAGNOSIS — Z880 Allergy status to penicillin: Secondary | ICD-10-CM | POA: Insufficient documentation

## 2018-06-29 MED ORDER — DICLOFENAC SODIUM 1 % TD GEL: 4.0000 g | Freq: Four times a day (QID) | TRANSDERMAL | 3 refills | Status: DC

## 2018-06-29 MED FILL — DICLOFENAC SODIUM 1% GEL: 1 | 6 days supply | Qty: 100 | Fill #0

## 2018-06-29 NOTE — Progress Notes (Signed)
Subjective:  Patient ID: [name], female    DOB: [birth year]  Age: 52 y.o. MRN: [id number]  CC: Hospitalization Follow-up   HPI BRADY PLANT  is a 52 year old female with a history of atrial fibrillation/atrial flutter (status post cardioversion in 02/2016 currently on rate control with metoprolol and Cardizem and anticoagulation Xarelto s/p DCCV in 2017 ), cerebral infarction due to embolism of right middle cerebral artery status post IV TPA (in 10/2015),  right MCA stroke in 01/2018 (after running out of Xarelto) She had a recent hospitalization at [facility] for respiratory failure secondary to community-acquired pneumonia and diastolic CHF from 2/67/1245 through 06/13/2018.  She reports doing well today and is not short of breath, denies pedal edema or chest pains. I have referred her to physical therapy due to right shoulder pain and reduced strength in her right upper extremity however this has not been beneficial.  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  She denies right arm tingling and weakness has improved but she does have pain on the lateral aspect of her right upper arm.  She currently worked as a Programmer, applications 2 days a week and has lost her Insurance underwriter.  She is in the process of applying for Medicaid.  She was seen by cardiology few days ago, hydrochlorothiazide was added to her regimen and a sleep study was ordered.  She has gained 10 pounds since her last visit 3 months ago but endorses not being as active since she quit her job.    Past Medical History:  Diagnosis Date  . Clotting disorder ([place])    on xarelto  . Essential hypertension   . Heart murmur    a. 10/2015 Echo: EF 60-65%, no rwma, mild AI/MR, sev dil LA/RA, PASP 66mmHg.  [name] Noncompliance   . NSVT (nonsustained ventricular tachycardia) ([place])    a. 10/2015 during admission for CVA/AF.  [name] Paroxysmal A-fib ([place])    a. CHA2DS2VASC =  4-->Xarelto;  b. 02/2016 Successful DCCV.  [name] Pneumonia 2012 x 2; 2015  . Stroke [facility])    a. 04/997 Embolic CVA of mid right middle cerebral atery - recieved TPA-->small amt of asymptomatic hemorrhagic transformation.  . Transient ischemic attack (TIA) 01/2013    Past Surgical History:  Procedure Laterality Date  . CARDIOVERSION N/A 02/18/2013   Procedure: CARDIOVERSION;  Surgeon: Sanda Klein, MD;  Location: [facility] ENDOSCOPY;  Service: Cardiovascular;  Laterality: N/A;  . CARDIOVERSION N/A 02/23/2013   Procedure: CARDIOVERSION;  Surgeon: Leonie Man, MD;  Location: [place];  Service: Cardiovascular;  Laterality: N/A;  BESIDE CV  . CARDIOVERSION N/A 03/12/2016   Procedure: CARDIOVERSION;  Surgeon: Larey Dresser, MD;  Location: [place];  Service: Cardiovascular;  Laterality: N/A;  . TEE WITHOUT CARDIOVERSION N/A 02/18/2013   Procedure: TRANSESOPHAGEAL ECHOCARDIOGRAM (TEE);  Surgeon: Sanda Klein, MD;  Location: [facility] ENDOSCOPY;  Service: Cardiovascular;  Laterality: N/A;  . TUBAL LIGATION  1992    Allergies  Allergen Reactions  . Penicillins Hives and Other (See Comments)    Unknown  Has patient had a PCN reaction causing immediate rash, facial/tongue/throat swelling, SOB or lightheadedness with hypotension: No Has patient had a PCN reaction causing severe rash involving mucus membranes or skin necrosis: No Has patient had a PCN reaction that required hospitalization No Has patient had a PCN reaction occurring within the last 10 years: No If all of the above answers  are "NO", then may proceed with Cephalosporin use.     Outpatient Medications Prior to Visit  Medication Sig Dispense Refill  . acetaminophen (TYLENOL) 500 MG tablet Take 500 mg by mouth every 6 (six) hours as needed for headache.    [name] atorvastatin (LIPITOR) 80 MG tablet Take 1 tablet (80 mg total) by mouth daily at 6 PM. 30 tablet 6  . butalbital-acetaminophen-caffeine (FIORICET, ESGIC) 50-325-40 MG tablet TAKE 1 TABLET  BY MOUTH EVERY 12 HOURS AS NEEDED FOR HEADACHE. (Patient taking differently: Take 1 tablet by mouth every 12 (twelve) hours as needed for headache. ) 40 tablet 1  . gabapentin (NEURONTIN) 300 MG capsule Take 1 capsule (300 mg total) by mouth 2 (two) times daily. 60 capsule 3  . hydrochlorothiazide (MICROZIDE) 12.5 MG capsule Take 1 capsule (12.5 mg total) by mouth daily. 90 capsule 3  . methocarbamol (ROBAXIN) 500 MG tablet Take 1 tablet (500 mg total) by mouth every 8 (eight) hours as needed for muscle spasms. 90 tablet 1  . rivaroxaban (XARELTO) 20 MG TABS tablet Take 1 tablet (20 mg total) by mouth daily with supper. 30 tablet 6  . doxycycline (VIBRA-TABS) 100 MG tablet Take 1 tablet (100 mg total) by mouth every 12 (twelve) hours. 10 tablet 0  . diltiazem (CARDIZEM CD) 300 MG 24 hr capsule Take 1 capsule (300 mg total) by mouth daily. 30 capsule 6  . metoprolol tartrate (LOPRESSOR) 25 MG tablet Take 0.5 tablets (12.5 mg total) by mouth 2 (two) times daily. (Patient taking differently: Take 25 mg by mouth daily. ) 30 tablet 6   Facility-Administered Medications Prior to Visit  Medication Dose Route Frequency Provider Last Rate Last Dose  . 0.9 %  sodium chloride infusion  500 mL Intravenous Once Armbruster, Carlota Raspberry, MD        ROS Review of Systems  Constitutional: Negative for activity change, appetite change and fatigue.  HENT: Negative for congestion, sinus pressure and sore throat.   Eyes: Negative for visual disturbance.  Respiratory: Negative for cough, chest tightness, shortness of breath and wheezing.   Cardiovascular: Negative for chest pain and palpitations.  Gastrointestinal: Negative for abdominal distention, abdominal pain and constipation.  Endocrine: Negative for polydipsia.  Genitourinary: Negative for dysuria and frequency.  Musculoskeletal:       See hpi  Skin: Negative for rash.  Neurological: Negative for tremors, light-headedness and numbness.  Hematological: Does  not bruise/bleed easily.  Psychiatric/Behavioral: Negative for agitation and behavioral problems.    Objective:  BP (!) 151/94   Pulse 88   Temp 97.6 F (36.4 C) (Oral)   Ht 5' 3.5" (1.613 m)   Wt 202 lb 9.6 oz (91.9 kg)   SpO2 97%   BMI 35.33 kg/m   BP/Weight 06/29/2018 06/23/2018 4/54/0981  Systolic BP 191 478 295  Diastolic BP 94 84 88  Wt. (Lbs) 202.6 202.8 204.81  BMI 35.33 35.36 36.28      Physical Exam  Constitutional: She is oriented to person, place, and time. She appears well-developed and well-nourished.  Cardiovascular: Normal rate, normal heart sounds and intact distal pulses. An irregularly irregular rhythm present.  No murmur heard. Pulmonary/Chest: Effort normal and breath sounds normal. She has no wheezes. She has no rales. She exhibits no tenderness.  Abdominal: Soft. Bowel sounds are normal. She exhibits no distension and no mass. There is no tenderness.  Musculoskeletal:  Tenderness on palpation of lateral aspect of right shoulder and biceps muscle. Abduction of right upper extremity  restricted to 70 degrees Left upper extremity is normal.  Neurological: She is alert and oriented to person, place, and time.  Skin: Skin is warm and dry.    CMP Latest Ref Rng & Units 06/12/2018 06/11/2018 02/10/2018  Glucose 70 - 99 mg/dL 104(H) 126(H) 110(H)  BUN 6 - 20 mg/dL 14 13 13   Creatinine 0.44 - 1.00 mg/dL 0.91 0.94 0.85  Sodium 135 - 145 mmol/L 138 137 141  Potassium 3.5 - 5.1 mmol/L 3.7 3.7 3.9  Chloride 98 - 111 mmol/L 106 107 105  CO2 22 - 32 mmol/L 24 21(L) 23  Calcium 8.9 - 10.3 mg/dL 8.6(L) 8.7(L) 9.3  Total Protein 6.5 - 8.1 g/dL - 6.8 -  Total Bilirubin 0.3 - 1.2 mg/dL - 0.8 -  Alkaline Phos 38 - 126 U/L - 94 -  AST 15 - 41 U/L - 44(H) -  ALT 0 - 44 U/L - 37 -    Lipid Panel     Component Value Date/Time   CHOL 175 02/09/2018 0531   CHOL 138 03/03/2017 0848   TRIG 84 02/09/2018 0531   HDL 56 02/09/2018 0531   HDL 59 03/03/2017 0848    CHOLHDL 3.1 02/09/2018 0531   VLDL 17 02/09/2018 0531   LDLCALC 102 (H) 02/09/2018 0531   LDLCALC 63 03/03/2017 0848    Assessment & Plan:   1. Hyperlipidemia LDL goal <70 Slightly elevated above goal Currently on atorvastatin 80 mg Low-cholesterol diet  2. History of CVA (cerebrovascular accident) No residual deficits Risk factor modification Continue high-dose statin  3. Chronic atrial fibrillation On anticoagulation with Xarelto and rate control with Cardizem  4. Chronic diastolic CHF (congestive heart failure) (HCC) Euvolemic EF 60 to 60% from echo 01/2018 Continue daily weight checks, low-sodium diet  5. Adhesive capsulitis of right shoulder Unimproved despite physical therapy Unable to use oral NSAIDs due to the fact that she is on Xarelto - AMB referral to orthopedics - DG Shoulder Right; Future - diclofenac sodium (VOLTAREN) 1 % GEL; Apply 4 g topically 4 (four) times daily.  Dispense: 100 g; Refill: 3  6. Need for immunization against influenza - Flu Vaccine QUAD 36+ mos IM  Advised to apply for the [place] financial discount to assist with her medications and referrals. Meds ordered this encounter  Medications  . diclofenac sodium (VOLTAREN) 1 % GEL    Sig: Apply 4 g topically 4 (four) times daily.    Dispense:  100 g    Refill:  3    Follow-up: Return in about 3 months (around 09/29/2018) for Follow-up of chronic medical conditions.   [name] MD

## 2018-06-29 NOTE — Patient Instructions (Signed)
Adhesive Capsulitis Adhesive capsulitis is inflammation of the tendons and ligaments that surround the shoulder joint (shoulder capsule). This condition causes the shoulder to become stiff and painful to move. Adhesive capsulitis is also called frozen shoulder. What are the causes? This condition may be caused by:  An injury to the shoulder joint.  Straining the shoulder.  Not moving the shoulder for a period of time. This can happen if your arm was injured or in a sling.  Long-standing health problems, such as: ? Diabetes. ? Thyroid problems. ? Heart disease. ? Stroke. ? Rheumatoid arthritis. ? Lung disease.  In some cases, the cause may not be known. What increases the risk? This condition is more likely to develop in:  Women.  People who are older than 54 years of age.  What are the signs or symptoms? Symptoms of this condition include:  Pain in the shoulder when moving the arm. There may also be pain when parts of the shoulder are touched. The pain is worse at night or when at rest.  Soreness or aching in the shoulder.  Inability to move the shoulder normally.  Muscle spasms.  How is this diagnosed? This condition is diagnosed with a physical exam and imaging tests, such as an X-ray or MRI. How is this treated? This condition may be treated with:  Treatment of the underlying cause or condition.  Physical therapy. This involves performing exercises to get the shoulder moving again.  Medicine. Medicine may be given to relieve pain, inflammation, or muscle spasms.  Steroid injections into the shoulder joint.  Shoulder manipulation. This is a procedure to move the shoulder into another position. It is done after you are given a medicine to make you fall asleep (general anesthetic). The joint may also be injected with salt water at high pressure to break down scarring.  Surgery. This may be done in severe cases when other treatments have failed.  Although most  people recover completely from adhesive capsulitis, some may not regain the full movement of the shoulder. Follow these instructions at home:  Take over-the-counter and prescription medicines only as told by your health care provider.  If you are being treated with physical therapy, follow instructions from your physical therapist.  Avoid exercises that put a lot of demand on your shoulder, such as throwing. These exercises can make pain worse.  If directed, apply ice to the injured area: ? Put ice in a plastic bag. ? Place a towel between your skin and the bag. ? Leave the ice on for 20 minutes, 2-3 times per day. Contact a health care provider if:  You develop new symptoms.  Your symptoms get worse. This information is not intended to replace advice given to you by your health care provider. Make sure you discuss any questions you have with your health care provider. Document Released: 06/29/2009 Document Revised: 02/07/2016 Document Reviewed: 12/25/2014 Elsevier Interactive Patient Education  2018 [employer]vier Inc.  

## 2018-06-30 MED FILL — ATORVASTATIN 80 MG TABLET: 80 | 30 days supply | Qty: 30 | Fill #1

## 2018-07-02 ENCOUNTER — Ambulatory Visit (HOSPITAL_COMMUNITY)
Admission: RE | Admit: 2018-07-02 | Discharge: 2018-07-02 | Disposition: A | Discharge status: Home / Self Care | Payer: Self-pay | Source: Ambulatory Visit | Attending: Family Medicine | Admitting: Family Medicine

## 2018-07-02 DIAGNOSIS — M7501 Adhesive capsulitis of right shoulder: Secondary | ICD-10-CM | POA: Insufficient documentation

## 2018-07-06 ENCOUNTER — Ambulatory Visit: Payer: BC Managed Care – PPO | Admitting: Family Medicine

## 2018-07-07 ENCOUNTER — Telehealth: Payer: Self-pay | Admitting: Family Medicine

## 2018-07-07 NOTE — Telephone Encounter (Signed)
Hi [name] can you check on [name], she has Med. Pending on her chart, is that alert need to be remove or the Pt need to contact you to finish the application please if you can call me since Pt has an appt to try to get CAFA tomorrow with me, my number is [phone]

## 2018-07-08 ENCOUNTER — Ambulatory Visit: Payer: Self-pay

## 2018-07-13 ENCOUNTER — Telehealth: Payer: Self-pay

## 2018-07-13 MED FILL — CARTIA XT 300 MG CAPSULE SA: 300 | 30 days supply | Qty: 30 | Fill #1

## 2018-07-13 MED FILL — BUTALB-ACETAMIN-CAFF 50-325: 50-325-40 | 20 days supply | Qty: 40 | Fill #1

## 2018-07-13 MED FILL — XARELTO 20 MG TABLET: 20 | 30 days supply | Qty: 30 | Fill #4

## 2018-07-13 NOTE — Telephone Encounter (Signed)
Patient was called and informed of lab results. 

## 2018-07-13 NOTE — Telephone Encounter (Signed)
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  Continue the pain gel prescribed by Dr. Margarita Rana.

## 2018-07-22 ENCOUNTER — Telehealth: Payer: Self-pay | Admitting: Family Medicine

## 2018-07-22 NOTE — Telephone Encounter (Signed)
I called [name] as request from Manager of the Pharmacy, after review her chart, it show that she is Potential medicaid, I informer her of this note and give the [name] the name and phone number of the person that's put the alert on her chart, [name] said that she will call her to find out the statue of that application

## 2018-07-28 MED FILL — HYDROCHLOROTHIAZIDE 12.5 MG: 12.5 | 30 days supply | Qty: 30 | Fill #1

## 2018-07-28 MED FILL — GABAPENTIN 300 MG CAPSULE: 300 | 30 days supply | Qty: 60 | Fill #1

## 2018-08-04 ENCOUNTER — Ambulatory Visit (HOSPITAL_BASED_OUTPATIENT_CLINIC_OR_DEPARTMENT_OTHER): Payer: Self-pay | Attending: Cardiology | Admitting: Cardiovascular Disease

## 2018-08-04 VITALS — Ht 64.0 in | Wt 202.0 lb

## 2018-08-04 DIAGNOSIS — R0683 Snoring: Secondary | ICD-10-CM

## 2018-08-04 DIAGNOSIS — Z6835 Body mass index (BMI) 35.0-35.9, adult: Secondary | ICD-10-CM | POA: Insufficient documentation

## 2018-08-04 DIAGNOSIS — Z79899 Other long term (current) drug therapy: Secondary | ICD-10-CM | POA: Insufficient documentation

## 2018-08-04 DIAGNOSIS — G4710 Hypersomnia, unspecified: Secondary | ICD-10-CM | POA: Insufficient documentation

## 2018-08-04 DIAGNOSIS — R5383 Other fatigue: Secondary | ICD-10-CM

## 2018-08-04 DIAGNOSIS — Z7901 Long term (current) use of anticoagulants: Secondary | ICD-10-CM | POA: Insufficient documentation

## 2018-08-04 DIAGNOSIS — G4733 Obstructive sleep apnea (adult) (pediatric): Secondary | ICD-10-CM | POA: Insufficient documentation

## 2018-08-14 ENCOUNTER — Encounter (HOSPITAL_BASED_OUTPATIENT_CLINIC_OR_DEPARTMENT_OTHER): Payer: Self-pay | Admitting: Cardiovascular Disease

## 2018-08-14 NOTE — Procedures (Signed)
Patient Name: [name], [name] Date: 08/04/2018 Gender: Female D.O.B: Aug 14, 1966 Age (years): 62 Referring Provider: Kerin Ransom Height (inches): 64 Interpreting Physician: Shelva Majestic MD, ABSM Weight (lbs): 202 RPSGT: Carolin Coy BMI: 35 MRN: [id number] Neck Size: 15.50  CLINICAL INFORMATION The patient is referred for a split night study with CPAP/BPAP.  Epworth Sleepiness Score: 19  MEDICATIONS     acetaminophen (TYLENOL) 500 MG tablet             atorvastatin (LIPITOR) 80 MG tablet         butalbital-acetaminophen-caffeine (FIORICET, ESGIC) 50-325-40 MG tablet         diclofenac sodium (VOLTAREN) 1 % GEL         diltiazem (CARDIZEM CD) 300 MG 24 hr capsule (Expired)         gabapentin (NEURONTIN) 300 MG capsule         hydrochlorothiazide (MICROZIDE) 12.5 MG capsule         methocarbamol (ROBAXIN) 500 MG tablet         metoprolol tartrate (LOPRESSOR) 25 MG tablet (Expired)         rivaroxaban (XARELTO) 20 MG TABS tablet      Medications self-administered by patient taken the night of the study : N/A  SLEEP STUDY TECHNIQUE As per the AASM Manual for the Scoring of Sleep and Associated Events v2.3 (April 2016) with a hypopnea requiring 4% desaturations.  The channels recorded and monitored were frontal, central and occipital EEG, electrooculogram (EOG), submentalis EMG (chin), nasal and oral airflow, thoracic and abdominal wall motion, anterior tibialis EMG, snore microphone, electrocardiogram, and pulse oximetry. Bi-level positive airway pressure (BiPAP) was initiated when the patient met split night criteria and was titrated according to treat sleep-disordered breathing.  RESPIRATORY PARAMETERS Diagnostic Total AHI (/hr): 72.9 RDI (/hr): 76.7 OA Index (/hr): 11 CA Index (/hr): 2.4 REM AHI (/hr): 78.3 NREM AHI (/hr): 71.7 Supine AHI (/hr): 101.8 Non-supine AHI (/hr): 72.33 Min O2 Sat (%): 73.0 Mean O2 (%): 90.6 Time below 88%  (min): 24.6   Titration Optimal IPAP Pressure (cm): 18 Optimal EPAP Pressure (cm): 14 AHI at Optimal Pressure (/hr): 0.0 Min O2 at Optimal Pressure (%): 95.0 Sleep % at Optimal (%): 89 Supine % at Optimal (%): 100   SLEEP ARCHITECTURE The study was initiated at 10:13:30 PM and terminated at 4:53:58 AM. The total recorded time was 400.5 minutes. EEG confirmed total sleep time was 229.5 minutes yielding a sleep efficiency of 57.3%%. Sleep onset after lights out was 15.3 minutes with a REM latency of 83.5 minutes. The patient spent 30.7%% of the night in stage N1 sleep, 49.7%% in stage N2 sleep, 0.0%% in stage N3 and 19.6% in REM. Wake after sleep onset (WASO) was 155.7 minutes. The Arousal Index was 35.8/hour.  LEG MOVEMENT DATA The total Periodic Limb Movements of Sleep (PLMS) were 0. The PLMS index was 0.0 .  CARDIAC DATA The 2 lead EKG demonstrated atrial fibrillation. The mean heart rate was 100.0 beats per minute. Other EKG findings include: PVCs.  IMPRESSIONS - Severe obstructive sleep apnea occurred during the diagnostic portion of the study (AHI  72.9 /h; RDI 76.7/h).  CPAP was initiated at 5 cm and was titrated to 15 cm. AHI at 15 cwp was 6.4 and RDI was 47.7.  BiPAP was initiated at 17/13 and was titrated to 18/14.  AHI at both BiPAP pressures was 0 and O2 nadir 94%. - No significant central sleep apnea occurred during the  diagnostic portion of the study (CAI = 2.4/hour). - Significant  oxygen desaturation to a nadir of 73%. - The patient snored with moderate snoring volume during the diagnostic portion of the study. - EKG findings include PVCs. - Clinically significant periodic limb movements of sleep did not occur during the study.  DIAGNOSIS - Obstructive Sleep Apnea (327.23 [G47.33 ICD-10]) - Excessive Daytime Sleepiness  RECOMMENDATIONS - Recommend an initial trial of BiPAP therapy at 17/13 cm H2O with heated humidification.  A Medium Wide size Philips Respironics Full Face  Mask Dreamwear mask was used for the titration. - Effort should be made to optimize nasal and pharyngeal patency. - Avoid alcohol, sedatives and other CNS depressants that may worsen sleep apnea and disrupt normal sleep architecture. - Sleep hygiene should be reviewed to assess factors that may improve sleep quality. - Weight management (BMI 35) and regular exercise should be initiated or continued. - Recommend a download be obtained in 30 days and sleep clinic evaluation.  [Electronically signed] 08/14/2018 02:34 PM  Shelva Majestic MD, [facility], ABSM Diplomate, American Board of Sleep Medicine   NPI: [id number] [place] PH: [phone]   FX: [phone] [employer]

## 2018-08-16 MED FILL — ATORVASTATIN 80 MG TABLET: 80 | 30 days supply | Qty: 30 | Fill #2

## 2018-08-16 MED FILL — XARELTO 20 MG TABLET: 20 | 30 days supply | Qty: 30 | Fill #5

## 2018-08-18 ENCOUNTER — Telehealth: Payer: Self-pay | Admitting: *Deleted

## 2018-08-18 NOTE — Telephone Encounter (Signed)
Left message to return a call to discuss sleep study results and recommendations. 

## 2018-08-18 NOTE — Telephone Encounter (Signed)
-----   Message from Troy Sine, MD sent at 08/14/2018  2:44 PM EST ----- Mariann Laster please notify pt and set up with DMI for BiPAP initiation.

## 2018-08-23 ENCOUNTER — Ambulatory Visit (INDEPENDENT_AMBULATORY_CARE_PROVIDER_SITE_OTHER): Payer: Self-pay | Admitting: Cardiology

## 2018-08-23 ENCOUNTER — Encounter: Payer: Self-pay | Admitting: Cardiology

## 2018-08-23 VITALS — BP 114/80 | HR 80 | Ht 63.5 in | Wt 208.0 lb

## 2018-08-23 DIAGNOSIS — I482 Chronic atrial fibrillation, unspecified: Secondary | ICD-10-CM

## 2018-08-23 DIAGNOSIS — G4730 Sleep apnea, unspecified: Secondary | ICD-10-CM

## 2018-08-23 DIAGNOSIS — E785 Hyperlipidemia, unspecified: Secondary | ICD-10-CM

## 2018-08-23 DIAGNOSIS — Z8673 Personal history of transient ischemic attack (TIA), and cerebral infarction without residual deficits: Secondary | ICD-10-CM

## 2018-08-23 DIAGNOSIS — M549 Dorsalgia, unspecified: Secondary | ICD-10-CM

## 2018-08-23 DIAGNOSIS — I10 Essential (primary) hypertension: Secondary | ICD-10-CM

## 2018-08-23 DIAGNOSIS — G8929 Other chronic pain: Secondary | ICD-10-CM

## 2018-08-23 DIAGNOSIS — Z7901 Long term (current) use of anticoagulants: Secondary | ICD-10-CM

## 2018-08-23 DIAGNOSIS — M5440 Lumbago with sciatica, unspecified side: Secondary | ICD-10-CM

## 2018-08-23 DIAGNOSIS — I5032 Chronic diastolic (congestive) heart failure: Secondary | ICD-10-CM

## 2018-08-23 LAB — BASIC METABOLIC PANEL
BUN/Creatinine Ratio: 13 (ref 9–23)
BUN: 13 mg/dL (ref 6–24)
CO2: 24 mmol/L (ref 20–29)
Calcium: 9.4 mg/dL (ref 8.7–10.2)
Chloride: 102 mmol/L (ref 96–106)
Creatinine, Ser: 0.97 mg/dL (ref 0.57–1.00)
GFR calc Af Amer: 77 mL/min/{1.73_m2} (ref >59)
GFR calc non Af Amer: 66 mL/min/{1.73_m2} (ref >59)
Glucose: 81 mg/dL (ref 65–99)
Potassium: 4.1 mmol/L (ref 3.5–5.2)
Sodium: 142 mmol/L (ref 134–144)

## 2018-08-23 NOTE — Patient Instructions (Signed)
Medication Instructions:  NONE  If you need a refill on your cardiac medications before your next appointment, please call your pharmacy.   Lab work: Your physician recommends that you have lab work TODAY: BMP If you have labs (blood work) drawn today and your tests are completely normal, you will receive your results only by: [name] MyChart Message (if you have MyChart) OR . A paper copy in the mail If you have any lab test that is abnormal or we need to change your treatment, we will call you to review the results.  Testing/Procedures: NONE  Follow-Up: At [facility], you and your health needs are our priority.  As part of our continuing mission to provide you with exceptional heart care, we have created designated Provider Care Teams.  These Care Teams include your primary Cardiologist (physician) and Advanced Practice Providers (APPs -  Physician Assistants and Nurse Practitioners) who all work together to provide you with the care you need, when you need it. You will need a follow up appointment in 6 months with DR. Sallyanne Kuster.  Please call our office 2 months in advance to schedule this appointment.

## 2018-08-23 NOTE — Assessment & Plan Note (Signed)
DCCV June 2017. Bi atrial enlargement, probably not a candidate for antiarrythmic

## 2018-08-23 NOTE — Assessment & Plan Note (Signed)
CVA Feb 2017 due to embolism of right middle cerebral artery [facility]) s/p IV tPA Recurrent CVA May 2019 after she missed Xarelto 4-5 days ($)

## 2018-08-23 NOTE — Assessment & Plan Note (Signed)
Sleep study showed severe sleep apnea- split night study pending

## 2018-08-23 NOTE — Progress Notes (Signed)
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 Fortunately she has no residual effects. She had DCCV in June 2017 but failed to hold NSR and is now in CAF. She has been on Xarelto since. She had bi atrial enlargement on echo then and Dr Sallyanne Kuster did not think an anti arrhythmic would be effective.    In May 2019 she had a recurrent CVA after she missed a few days of Xarelto secondary to cost.  Echo in  May 2018 showed LAE and LA thrombus.  She again recovered without residual.   She was seen in the office 07/03/18  after an admission in Sept 2019 for acute respiratory failure felt to be a combination of COPD exacerbation and diastolic CHF.  She was doing better though her weight is 4 lbs above her baseline.  HCTZ 12.5 mg was added and a sleep study study scheduled. She is in the office today for follow up.  She is doing well since I saw her last.  Dr. Herma Mering added Neurontin twice daily and she tells me she now has no longer back pain or arm pain.  She did have a sleep study which showed severe sleep apnea.  She is to be scheduled for a split-night study.  She is watching her sodium intake.   Current Outpatient Medications  Medication Sig Dispense Refill  . acetaminophen (TYLENOL) 500 MG tablet Take 500 mg by mouth every 6 (six) hours as needed for headache.    [name] atorvastatin (LIPITOR) 80 MG tablet Take 1 tablet (80 mg total) by mouth daily at 6 PM. 30 tablet 6  . butalbital-acetaminophen-caffeine (FIORICET, ESGIC) 50-325-40 MG tablet TAKE 1 TABLET BY MOUTH EVERY 12 HOURS AS NEEDED FOR HEADACHE. (Patient taking differently: Take 1 tablet by mouth every 12 (twelve) hours as needed for headache. ) 40 tablet 1  . diclofenac sodium (VOLTAREN) 1 % GEL Apply 4 g topically 4 (four) times daily. 100 g 3  . diltiazem (CARDIZEM CD)  300 MG 24 hr capsule Take 1 capsule (300 mg total) by mouth daily. 30 capsule 6  . gabapentin (NEURONTIN) 300 MG capsule Take 1 capsule (300 mg total) by mouth 2 (two) times daily. 60 capsule 3  . hydrochlorothiazide (MICROZIDE) 12.5 MG capsule Take 1 capsule (12.5 mg total) by mouth daily. 90 capsule 3  . methocarbamol (ROBAXIN) 500 MG tablet Take 1 tablet (500 mg total) by mouth every 8 (eight) hours as needed for muscle spasms. 90 tablet 1  . rivaroxaban (XARELTO) 20 MG TABS tablet Take 1 tablet (20 mg total) by mouth daily with supper. 30 tablet 6  . metoprolol tartrate (LOPRESSOR) 25 MG tablet Take 0.5 tablets (12.5 mg total) by mouth 2 (two) times daily. (Patient taking differently: Take 25 mg by mouth daily. ) 30 tablet 6   Current Facility-Administered Medications  Medication Dose Route Frequency Provider Last Rate Last Dose  . 0.9 %  sodium chloride infusion  500 mL Intravenous Once Armbruster, Carlota Raspberry, MD        Allergies  Allergen Reactions  . Penicillins Hives and Other (See Comments)    Unknown  Has patient had a PCN reaction causing immediate rash, facial/tongue/throat swelling, SOB or lightheadedness with hypotension: No Has patient had a PCN reaction causing severe rash involving mucus membranes  or skin necrosis: No Has patient had a PCN reaction that required hospitalization No Has patient had a PCN reaction occurring within the last 10 years: No If all of the above answers are "NO", then may proceed with Cephalosporin use.    Past Medical History:  Diagnosis Date  . Clotting disorder ([place])    on xarelto  . Essential hypertension   . Heart murmur    a. 10/2015 Echo: EF 60-65%, no rwma, mild AI/MR, sev dil LA/RA, PASP 78mmHg.  [name] Noncompliance   . NSVT (nonsustained ventricular tachycardia) ([place])    a. 10/2015 during admission for CVA/AF.  [name] Paroxysmal A-fib ([place])    a. CHA2DS2VASC = 4-->Xarelto;  b. 02/2016 Successful DCCV.  [name] Pneumonia 2012 x 2; 2015  . Stroke  [facility])    a. 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  . Transient ischemic attack (TIA) 01/2013    Social History   Socioeconomic History  . Marital status: Single    Spouse name: Not on file  . Number of children: Not on file  . Years of education: Not on file  . Highest education level: Not on file  Occupational History  . Occupation: Airline pilot: [employer]  . Occupation: in home health aide  Social Needs  . Financial resource strain: Not on file  . Food insecurity:    Worry: Not on file    Inability: Not on file  . Transportation needs:    Medical: Not on file    Non-medical: Not on file  Tobacco Use  . Smoking status: Former Smoker    Years: 3.00    Types: Cigarettes    Last attempt to quit: 03/15/2013    Years since quitting: 5.4  . Smokeless tobacco: Never Used  Substance and Sexual Activity  . Alcohol use: Yes    Comment: drinks on the weekends, 2 drinks and 2 beers  . Drug use: No  . Sexual activity: Not Currently    Birth control/protection: None  Lifestyle  . Physical activity:    Days per week: Not on file    Minutes per session: Not on file  . Stress: Not on file  Relationships  . Social connections:    Talks on phone: Not on file    Gets together: Not on file    Attends religious service: Not on file    Active member of club or organization: Not on file    Attends meetings of clubs or organizations: Not on file    Relationship status: Not on file  . Intimate partner violence:    Fear of current or ex partner: Not on file    Emotionally abused: Not on file    Physically abused: Not on file    Forced sexual activity: Not on file  Other Topics Concern  . Not on file  Social History Narrative  . Not on file     Family History  Problem Relation Age of Onset  . Cancer Mother   . Atrial fibrillation Mother   . Breast cancer Mother   .  Hypertension Father   . Atrial fibrillation Father   . Peripheral vascular disease Father   . Hypertension Unknown   . Diabetes Unknown   . Heart attack Neg Hx   . Stroke Neg Hx   . Rectal cancer Neg Hx   . Colon cancer Neg Hx   . Esophageal cancer  Neg Hx   . Stomach cancer Neg Hx      Review of Systems: General: negative for chills, fever, night sweats or weight changes.  Cardiovascular: negative for chest pain, dyspnea on exertion, edema, orthopnea, palpitations, paroxysmal nocturnal dyspnea or shortness of breath Dermatological: negative for rash Respiratory: negative for cough or wheezing Urologic: negative for hematuria Abdominal: negative for nausea, vomiting, diarrhea, bright red blood per rectum, melena, or hematemesis Neurologic: negative for visual changes, syncope, or dizziness All other systems reviewed and are otherwise negative except as noted above.    Blood pressure 114/80, pulse 80, height 5' 3.5" (1.613 m), weight 208 lb (94.3 kg), SpO2 97 %.  General appearance: alert, cooperative, no distress and moderately obese Neck: no carotid bruit and no JVD Lungs: clear to auscultation bilaterally Heart: irregularly irregular rhythm Extremities: no edema Skin: warm and dry Neurologic: Grossly normal   ASSESSMENT AND PLAN:   Chronic diastolic CHF (congestive heart failure) ([place]) Pt admitted 9/27-9/29/19 with SOB- I suspect this was diastolic CHF secondary to poorly controlled HTN, AF with RVR, sodium intake, and sleep apnea. HCTZ added Oct 2019.  Sleep apnea Sleep study showed severe sleep apnea- split night study pending  Chronic atrial fibrillation [facility]) DCCV June 2017. Bi atrial enlargement, probably not a candidate for antiarrythmic   Chronic anticoagulation CHADS VASC=5. She is on Xarelto  History of CVA (cerebrovascular accident) CVA Feb 2017 due to embolism of right middle cerebral artery [facility]) s/p IV tPA Recurrent CVA May 2019 after she missed  Xarelto 4-5 days ($)  Hyperlipidemia LDL goal <70 Due for lipids in May 2020  Chronic back pain Followed by Dr Nelva Bush   PLAN  No change in Rx- check BMP on HCTZ..   F/u spilt night study for C-pap.  F/U Dr Sallyanne Kuster in May, check lipids then.   Kerin Ransom PA-C 08/23/2018 9:36 AM

## 2018-08-23 NOTE — Assessment & Plan Note (Signed)
Followed by Dr Nelva Bush

## 2018-08-23 NOTE — Assessment & Plan Note (Signed)
CHADS VASC=5. She is on Xarelto

## 2018-08-23 NOTE — Assessment & Plan Note (Signed)
Due for lipids in May 2020

## 2018-08-23 NOTE — Assessment & Plan Note (Addendum)
Pt admitted 9/27-9/29/19 with SOB- I suspect this was diastolic CHF secondary to poorly controlled HTN, AF with RVR, sodium intake, and sleep apnea. HCTZ added Oct 2019.

## 2018-08-23 NOTE — Progress Notes (Signed)
TY, LK MCr

## 2018-08-23 NOTE — Telephone Encounter (Signed)
Patient had appointment with [name] today and was given sleep study results and recommendations by me. Patient voiced understanding of what was told to her. BIPAP referral was sent to [facility].

## 2018-08-24 ENCOUNTER — Other Ambulatory Visit: Payer: Self-pay | Admitting: Family Medicine

## 2018-08-24 MED FILL — CARTIA XT 300 MG CAPSULE SA: 300 | 30 days supply | Qty: 30 | Fill #2

## 2018-08-24 MED FILL — HYDROCHLOROTHIAZIDE 12.5 MG: 12.5 | 30 days supply | Qty: 30 | Fill #2

## 2018-09-01 ENCOUNTER — Ambulatory Visit: Payer: Self-pay | Attending: Family Medicine | Admitting: Physician Assistant

## 2018-09-01 VITALS — BP 134/86 | HR 72 | Temp 98.4°F | Resp 16 | Wt 208.8 lb

## 2018-09-01 DIAGNOSIS — Z79899 Other long term (current) drug therapy: Secondary | ICD-10-CM | POA: Insufficient documentation

## 2018-09-01 DIAGNOSIS — N898 Other specified noninflammatory disorders of vagina: Secondary | ICD-10-CM | POA: Insufficient documentation

## 2018-09-01 DIAGNOSIS — Z7901 Long term (current) use of anticoagulants: Secondary | ICD-10-CM | POA: Insufficient documentation

## 2018-09-01 DIAGNOSIS — I10 Essential (primary) hypertension: Secondary | ICD-10-CM | POA: Insufficient documentation

## 2018-09-01 DIAGNOSIS — Z8673 Personal history of transient ischemic attack (TIA), and cerebral infarction without residual deficits: Secondary | ICD-10-CM | POA: Insufficient documentation

## 2018-09-01 DIAGNOSIS — R350 Frequency of micturition: Secondary | ICD-10-CM | POA: Insufficient documentation

## 2018-09-01 DIAGNOSIS — I480 Paroxysmal atrial fibrillation: Secondary | ICD-10-CM | POA: Insufficient documentation

## 2018-09-01 DIAGNOSIS — E860 Dehydration: Secondary | ICD-10-CM | POA: Insufficient documentation

## 2018-09-01 LAB — POCT URINALYSIS DIP (CLINITEK)
Blood, UA: NEGATIVE
Glucose, UA: NEGATIVE mg/dL
Leukocytes, UA: NEGATIVE
NITRITE UA: NEGATIVE
UROBILINOGEN UA: 1 U/dL
pH, UA: 5.5 (ref 5.0–8.0)

## 2018-09-01 MED FILL — XARELTO 20 MG TABLET: 20 | 30 days supply | Qty: 30 | Fill #0

## 2018-09-01 NOTE — Progress Notes (Signed)
Patient ID: [name], female   DOB: January 11, 1966, 52 y.o.   MRN: [id number]      [name], is a 52 y.o. female  MIW:[id number]  GNO:[id number]  DOB - May 17, 1966  Subjective:  Chief Complaint and HPI: [name] is a 52 y.o. female here today  2 week h/o some dysuria and burning/itching when urinating.  No vaginal discharge. Symptoms X 2 weeks.  Blood sugar normal 08/23/2018.  No flank/back pain.  No N/V. No fever.  Symptoms are on and off.  Denies STI risk factors  ROS:   Constitutional:  No f/c, No night sweats, No unexplained weight loss. EENT:  No vision changes, No blurry vision, No hearing changes. No mouth, throat, or ear problems.  Respiratory: No cough, No SOB Cardiac: No CP, no palpitations GI:  No abd pain, No N/V/D. GU: + Urinary s/sx Musculoskeletal: No joint pain Neuro: No headache, no dizziness, no motor weakness.  Skin: No rash Endocrine:  No polydipsia. No polyuria.  Psych: Denies SI/HI  No problems updated.  ALLERGIES: PCN  PAST MEDICAL HISTORY: Past Medical History:  Diagnosis Date  . Clotting disorder ([place])    on xarelto  . Essential hypertension   . Heart murmur    a. 10/2015 Echo: EF 60-65%, no rwma, mild AI/MR, sev dil LA/RA, PASP 52mmHg.  [name] Noncompliance   . NSVT (nonsustained ventricular tachycardia) ([place])    a. 10/2015 during admission for CVA/AF.  [name] Paroxysmal A-fib ([place])    a. CHA2DS2VASC = 4-->Xarelto;  b. 02/2016 Successful DCCV.  [name] Pneumonia 2012 x 2; 2015  . Stroke [facility])    a. 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  . Transient ischemic attack (TIA) 01/2013    MEDICATIONS AT HOME: Prior to Admission medications   Medication Sig Start Date End Date Taking? Authorizing Provider  acetaminophen (TYLENOL) 500 MG tablet Take 500 mg by mouth every 6 (six) hours as needed for headache.    [provider]  atorvastatin (LIPITOR) 80 MG tablet Take  1 tablet (80 mg total) by mouth daily at 6 PM. 04/14/18   Newlin, Enobong, MD  butalbital-acetaminophen-caffeine (FIORICET, ESGIC) 50-325-40 MG tablet TAKE 1 TABLET BY MOUTH EVERY 12 HOURS AS NEEDED FOR HEADACHE. 08/24/18   [name], MD  diclofenac sodium (VOLTAREN) 1 % GEL Apply 4 g topically 4 (four) times daily. 06/29/18   [name], MD  diltiazem (CARDIZEM CD) 300 MG 24 hr capsule Take 1 capsule (300 mg total) by mouth daily. 04/14/18 08/23/18  [name], MD  gabapentin (NEURONTIN) 300 MG capsule Take 1 capsule (300 mg total) by mouth 2 (two) times daily. 04/14/18   [name], MD  hydrochlorothiazide (MICROZIDE) 12.5 MG capsule Take 1 capsule (12.5 mg total) by mouth daily. 06/23/18 09/21/18  Erlene Quan, PA-C  methocarbamol (ROBAXIN) 500 MG tablet Take 1 tablet (500 mg total) by mouth every 8 (eight) hours as needed for muscle spasms. 04/14/18   [name], MD  metoprolol tartrate (LOPRESSOR) 25 MG tablet Take 0.5 tablets (12.5 mg total) by mouth 2 (two) times daily. Patient taking differently: Take 25 mg by mouth daily.  04/14/18 06/11/18  [name], MD  rivaroxaban (XARELTO) 20 MG TABS tablet Take 1 tablet (20 mg total) by mouth daily with supper. 04/14/18   [name], MD     Objective:  EXAM:   Vitals:   09/01/18 1420  BP: 134/86  Pulse: 72  Resp: 16  Temp: 98.4 F (36.9 C)  TempSrc: Oral  SpO2: 98%  Weight: 208 lb 12.8 oz (94.7 kg)    General appearance : A&OX3. NAD. Non-toxic-appearing HEENT: Atraumatic and Normocephalic.  PERRLA. EOM intact.  Neck: supple, no JVD. No cervical lymphadenopathy. No thyromegaly Chest/Lungs:  Breathing-non-labored, Good air entry bilaterally, breath sounds normal without rales, rhonchi, or wheezing  CVS: S1 S2 regular, no murmurs, gallops, rubs  Abdomen: Bowel sounds present, Non tender and not distended with no gaurding, rigidity or rebound. Extremities: Bilateral Lower Ext shows no edema, both legs are warm  to touch with = pulse throughout Neurology:  CN II-XII grossly intact, Non focal.   Psych:  TP linear. J/I WNL. Normal speech. Appropriate eye contact and affect.  Skin:  No Rash  Data Review Lab Results  Component Value Date   HGBA1C 6.1 (H) 02/09/2018   HGBA1C 6.2 11/14/2015   HGBA1C 6.5 (H) 11/03/2015     Assessment & Plan   1. Urinary frequency Increase water intake 80-100 ounces/day - POCT URINALYSIS DIP (CLINITEK) - Urine Culture  2. Vaginal itching - Urine cytology ancillary only  3. Dehydration Drink 80-100 ounces water daily.     Patient have been counseled extensively about nutrition and exercise  Return for for 09/29/2018 appt with Dr Margarita Rana.  The patient was given clear instructions to go to ER or return to medical center if symptoms don't improve, worsen or new problems develop. The patient verbalized understanding. The patient was told to call to get lab results if they haven't heard anything in the next week.     Freeman Caldron, PA-C [facility] and [facility] [place], [place]   09/01/2018, 2:49 PM

## 2018-09-01 NOTE — Patient Instructions (Signed)
Drink 80-100 ounces water daily 

## 2018-09-02 LAB — URINE CYTOLOGY ANCILLARY ONLY
Chlamydia: NEGATIVE
Neisseria Gonorrhea: NEGATIVE
TRICH (WINDOWPATH): NEGATIVE

## 2018-09-03 LAB — URINE CYTOLOGY ANCILLARY ONLY: CANDIDA VAGINITIS: NEGATIVE

## 2018-09-03 LAB — URINE CULTURE

## 2018-09-06 ENCOUNTER — Other Ambulatory Visit: Payer: Self-pay | Admitting: Physician Assistant

## 2018-09-06 ENCOUNTER — Telehealth: Payer: Self-pay

## 2018-09-06 MED ORDER — METRONIDAZOLE 500 MG PO TABS: 500.0000 mg | ORAL_TABLET | Freq: Two times a day (BID) | ORAL | 0 refills | Status: DC

## 2018-09-06 MED FILL — metroNIDAZOLE 500 MG TABS: 500 | 7 days supply | Qty: 14 | Fill #0

## 2018-09-06 NOTE — Telephone Encounter (Signed)
Contacted pt to go over urine results pt is aware and doesn't have any questions or concerns

## 2018-09-20 ENCOUNTER — Other Ambulatory Visit: Payer: Self-pay | Admitting: Family Medicine

## 2018-09-29 ENCOUNTER — Ambulatory Visit: Payer: Self-pay | Attending: Family Medicine | Admitting: Family Medicine

## 2018-09-29 ENCOUNTER — Telehealth: Payer: Self-pay

## 2018-09-29 ENCOUNTER — Encounter: Payer: Self-pay | Admitting: Family Medicine

## 2018-09-29 VITALS — BP 114/74 | HR 84 | Temp 98.2°F | Ht 63.5 in | Wt 210.4 lb

## 2018-09-29 DIAGNOSIS — G4730 Sleep apnea, unspecified: Secondary | ICD-10-CM | POA: Insufficient documentation

## 2018-09-29 DIAGNOSIS — I482 Chronic atrial fibrillation, unspecified: Secondary | ICD-10-CM | POA: Insufficient documentation

## 2018-09-29 DIAGNOSIS — I480 Paroxysmal atrial fibrillation: Secondary | ICD-10-CM | POA: Insufficient documentation

## 2018-09-29 DIAGNOSIS — Z8673 Personal history of transient ischemic attack (TIA), and cerebral infarction without residual deficits: Secondary | ICD-10-CM | POA: Insufficient documentation

## 2018-09-29 DIAGNOSIS — Z79899 Other long term (current) drug therapy: Secondary | ICD-10-CM | POA: Insufficient documentation

## 2018-09-29 DIAGNOSIS — R5383 Other fatigue: Secondary | ICD-10-CM | POA: Insufficient documentation

## 2018-09-29 DIAGNOSIS — I10 Essential (primary) hypertension: Secondary | ICD-10-CM | POA: Insufficient documentation

## 2018-09-29 DIAGNOSIS — Z7901 Long term (current) use of anticoagulants: Secondary | ICD-10-CM | POA: Insufficient documentation

## 2018-09-29 MED ORDER — METOPROLOL TARTRATE 25 MG PO TABS: 12.5000 mg | ORAL_TABLET | Freq: Two times a day (BID) | ORAL | 6 refills | Status: DC

## 2018-09-29 MED ORDER — DILTIAZEM HCL ER COATED BEADS 300 MG PO CP24: 300.0000 mg | ORAL_CAPSULE | Freq: Every day | ORAL | 6 refills | Status: DC

## 2018-09-29 MED ORDER — ATORVASTATIN CALCIUM 80 MG PO TABS: 80.0000 mg | ORAL_TABLET | Freq: Every day | ORAL | 6 refills | Status: DC

## 2018-09-29 MED ORDER — RIVAROXABAN 20 MG PO TABS: 20.0000 mg | ORAL_TABLET | Freq: Every day | ORAL | 6 refills | Status: DC

## 2018-09-29 MED FILL — XARELTO 20 MG TABLET: 20 | 30 days supply | Qty: 30 | Fill #1

## 2018-09-29 MED FILL — METOPROLOL TARTRATE 25 MG T: 25 | 30 days supply | Qty: 30 | Fill #0

## 2018-09-29 MED FILL — CARTIA XT 300 MG CAPSULE SA: 300 | 30 days supply | Qty: 30 | Fill #0

## 2018-09-29 MED FILL — ATORVASTATIN 80 MG TABLET: 80 | 30 days supply | Qty: 30 | Fill #0

## 2018-09-29 NOTE — Progress Notes (Signed)
Patient wants to discuss sleep apnea.

## 2018-09-29 NOTE — Telephone Encounter (Signed)
Met with the patient at request of Dr Margarita Rana.  The patient stated that she has applied for medicaid and the application is now pending. She thinks it has been almost 90 days since it was submitted. She is requesting legal aid assistance and she  completed an application for [employer] [place] to provide guidance regarding the decision timeline/process. The application was faxed to [place] [employer] [facility] [place],  She also explained that she plans to apply for disability tomorrow.   The recommendation from her sleep study  is for a trial on BiPAP.  She currently has no insurance and is not able to afford to private pay for a new machine. The American [employer]- CPAP assistance program is not carrying BiPAP machines at this time.  Dr Margarita Rana is aware. This information was explained to the patient. She now has an appointment to see Dr Joya Gaskins 10/05/2018 @ 1400 to discuss other possible options for BiPAP.

## 2018-09-29 NOTE — Progress Notes (Signed)
Subjective:  Patient ID: [name], female    DOB: 1966/09/13  Age: 53 y.o. MRN: [id number]  CC: Hypertension   HPI [name]  is a 53 year old female with a history of atrial fibrillation/atrial flutter (status post cardioversion in 02/2016 currently on rate control with metoprolol and Cardizem and anticoagulation Xarelto s/p DCCV in 2017 ), cerebral infarction due to embolism of right middle cerebral artery status post IV TPA (in 10/2015),  right MCA stroke in 01/2018 (after running out of Xarelto) At her previous visit she had had reduced range of motion of her right shoulder but at this time she reported significant improvement in range of motion.  She has been unable to work with the [facility] system due to her multiple medical conditions but does part-time work as a Programmer, applications and is considering applying for disability. She complains of fatigue even with minimal activity.  Denies syncope, dizziness. Compliant with Xarelto and denies bleeding or bruising, denies chest pains or shortness of breath.  She had a sleep study which revealed AHI of 72.9/hour; RDI 76.7/hour.  BiPAP recommended at 17/13 cmH2O with heated humidification.  She currently has no medical coverage for a BiPAP machine.  Past Medical History:  Diagnosis Date  . Clotting disorder ([place])    on xarelto  . Essential hypertension   . Heart murmur    a. 10/2015 Echo: EF 60-65%, no rwma, mild AI/MR, sev dil LA/RA, PASP 80mHg.  .Marland KitchenNoncompliance   . NSVT (nonsustained ventricular tachycardia) ([place]    a. 10/2015 during admission for CVA/AF.  .[place]Paroxysmal A-fib ([place]    a. CHA2DS2VASC = 4-->Xarelto;  b. 02/2016 Successful DCCV.  .Marland KitchenPneumonia 2012 x 2; 2015  . Stroke ([facility]    a. 24/4034Embolic CVA of mid right middle cerebral atery - recieved TPA-->small amt of asymptomatic hemorrhagic transformation.  . Transient ischemic attack (TIA) 01/2013    Past Surgical History:  Procedure Laterality Date  .  CARDIOVERSION N/A 02/18/2013   Procedure: CARDIOVERSION;  Surgeon: MSanda Klein MD;  [facility];  Service: Cardiovascular;  Laterality: N/A;  . CARDIOVERSION N/A 02/23/2013   Procedure: CARDIOVERSION;  Surgeon: DLeonie Man MD;  [place]  Service: Cardiovascular;  Laterality: N/A;  BESIDE CV  . CARDIOVERSION N/A 03/12/2016   Procedure: CARDIOVERSION;  Surgeon: DLarey Dresser MD;  [place]  Service: Cardiovascular;  Laterality: N/A;  . TEE WITHOUT CARDIOVERSION N/A 02/18/2013   Procedure: TRANSESOPHAGEAL ECHOCARDIOGRAM (TEE);  Surgeon: MSanda Klein MD;  [facility];  Service: Cardiovascular;  Laterality: N/A;  . TUBAL LIGATION  1992    Allergies  Allergen Reactions  . Penicillins Hives and Other (See Comments)    Unknown  Has patient had a PCN reaction causing immediate rash, facial/tongue/throat swelling, SOB or lightheadedness with hypotension: No Has patient had a PCN reaction causing severe rash involving mucus membranes or skin necrosis: No Has patient had a PCN reaction that required hospitalization No Has patient had a PCN reaction occurring within the last 10 years: No If all of the above answers are "NO", then may proceed with Cephalosporin use.     Outpatient Medications Prior to Visit  Medication Sig Dispense Refill  . acetaminophen (TYLENOL) 500 MG tablet Take 500 mg by mouth every 6 (six) hours as needed for headache.    . butalbital-acetaminophen-caffeine (FIORICET, ESGIC) 50-325-40 MG tablet TAKE 1 TABLET BY MOUTH EVERY 12 HOURS AS NEEDED FOR HEADACHE. 40 tablet 0  .  diclofenac sodium (VOLTAREN) 1 % GEL Apply 4 g topically 4 (four) times daily. 100 g 3  . gabapentin (NEURONTIN) 300 MG capsule Take 1 capsule (300 mg total) by mouth 2 (two) times daily. 60 capsule 3  . methocarbamol (ROBAXIN) 500 MG tablet Take 1 tablet (500 mg total) by mouth every 8 (eight) hours as needed for muscle spasms. 90 tablet 1  . atorvastatin  (LIPITOR) 80 MG tablet Take 1 tablet (80 mg total) by mouth daily at 6 PM. 30 tablet 6  . rivaroxaban (XARELTO) 20 MG TABS tablet Take 1 tablet (20 mg total) by mouth daily with supper. 30 tablet 6  . hydrochlorothiazide (MICROZIDE) 12.5 MG capsule Take 1 capsule (12.5 mg total) by mouth daily. 90 capsule 3  . metroNIDAZOLE (FLAGYL) 500 MG tablet Take 1 tablet (500 mg total) by mouth 2 (two) times daily. (Patient not taking: Reported on 09/29/2018) 14 tablet 0  . diltiazem (CARDIZEM CD) 300 MG 24 hr capsule Take 1 capsule (300 mg total) by mouth daily. 30 capsule 6  . metoprolol tartrate (LOPRESSOR) 25 MG tablet Take 0.5 tablets (12.5 mg total) by mouth 2 (two) times daily. (Patient taking differently: Take 25 mg by mouth daily. ) 30 tablet 6   Facility-Administered Medications Prior to Visit  Medication Dose Route Frequency Provider Last Rate Last Dose  . 0.9 %  sodium chloride infusion  500 mL Intravenous Once Armbruster, Carlota Raspberry, MD        ROS Review of Systems  Constitutional: Negative for activity change, appetite change and fatigue.  HENT: Negative for congestion, sinus pressure and sore throat.   Eyes: Negative for visual disturbance.  Respiratory: Negative for cough, chest tightness, shortness of breath and wheezing.   Cardiovascular: Negative for chest pain and palpitations.  Gastrointestinal: Negative for abdominal distention, abdominal pain and constipation.  Endocrine: Negative for polydipsia.  Genitourinary: Negative for dysuria and frequency.  Musculoskeletal: Negative for arthralgias and back pain.  Skin: Negative for rash.  Neurological: Negative for tremors, light-headedness and numbness.  Hematological: Does not bruise/bleed easily.  Psychiatric/Behavioral: Negative for agitation and behavioral problems.    Objective:  BP 114/74   Pulse 84   Temp 98.2 F (36.8 C) (Oral)   Ht 5' 3.5" (1.613 m)   Wt 210 lb 6.4 oz (95.4 kg)   SpO2 97%   BMI 36.69 kg/m    BP/Weight 09/29/2018 09/01/2018 06/20/2693  Systolic BP 854 627 035  Diastolic BP 74 86 80  Wt. (Lbs) 210.4 208.8 208  BMI 36.69 36.41 36.27     Physical Exam Constitutional:      Appearance: She is well-developed.  Cardiovascular:     Rate and Rhythm: Normal rate. Rhythm irregular.     Heart sounds: Normal heart sounds. No murmur.  Pulmonary:     Effort: Pulmonary effort is normal.     Breath sounds: Normal breath sounds. No wheezing or rales.  Chest:     Chest wall: No tenderness.  Abdominal:     General: Bowel sounds are normal. There is no distension.     Palpations: Abdomen is soft. There is no mass.     Tenderness: There is no abdominal tenderness.  Musculoskeletal: Normal range of motion.  Neurological:     Mental Status: She is alert and oriented to person, place, and time.      CMP Latest Ref Rng & Units 08/23/2018 06/12/2018 06/11/2018  Glucose 65 - 99 mg/dL 81 104(H) 126(H)  BUN 6 - 24  mg/dL '13 14 13  '$ Creatinine 0.57 - 1.00 mg/dL 0.97 0.91 0.94  Sodium 134 - 144 mmol/L 142 138 137  Potassium 3.5 - 5.2 mmol/L 4.1 3.7 3.7  Chloride 96 - 106 mmol/L 102 106 107  CO2 20 - 29 mmol/L 24 24 21(L)  Calcium 8.7 - 10.2 mg/dL 9.4 8.6(L) 8.7(L)  Total Protein 6.5 - 8.1 g/dL - - 6.8  Total Bilirubin 0.3 - 1.2 mg/dL - - 0.8  Alkaline Phos 38 - 126 U/L - - 94  AST 15 - 41 U/L - - 44(H)  ALT 0 - 44 U/L - - 37    Lipid Panel     Component Value Date/Time   CHOL 175 02/09/2018 0531   CHOL 138 03/03/2017 0848   TRIG 84 02/09/2018 0531   HDL 56 02/09/2018 0531   HDL 59 03/03/2017 0848   CHOLHDL 3.1 02/09/2018 0531   VLDL 17 02/09/2018 0531   LDLCALC 102 (H) 02/09/2018 0531   LDLCALC 63 03/03/2017 0848     Assessment & Plan:   1. Chronic atrial fibrillation Still in A. Fib Continue anticoagulation with Xarelto and rate control with Cardizem Closely followed by cardiology - rivaroxaban (XARELTO) 20 MG TABS tablet; Take 1 tablet (20 mg total) by mouth daily with  supper.  Dispense: 30 tablet; Refill: 6 - diltiazem (CARDIZEM CD) 300 MG 24 hr capsule; Take 1 capsule (300 mg total) by mouth daily for 30 days.  Dispense: 30 capsule; Refill: 6  2. Essential hypertension Controlled Counseled on blood pressure goal of less than 130/80, low-sodium, DASH diet, medication compliance, 150 minutes of moderate intensity exercise per week. Discussed medication compliance, adverse effects. - CMP14+EGFR - Lipid panel - metoprolol tartrate (LOPRESSOR) 25 MG tablet; Take 0.5 tablets (12.5 mg total) by mouth 2 (two) times daily for 30 days.  Dispense: 30 tablet; Refill: 6  3. Sleep apnea, unspecified type Medical coverage precludes obtaining BiPAP Case manager spoke with the patient and BiPAP machines are unavailable through the sleep apnea association We will place on Dr. Joya Gaskins schedule for further evaluation in the event that she might be able to be in auto titration on the CPAP  4. Other fatigue Could also be secondary to untreated sleep apnea Discussed staying active, water aerobics, home physical therapy. - VITAMIN D 25 Hydroxy (Vit-D Deficiency, Fractures)   Meds ordered this encounter  Medications  . rivaroxaban (XARELTO) 20 MG TABS tablet    Sig: Take 1 tablet (20 mg total) by mouth daily with supper.    Dispense:  30 tablet    Refill:  6  . metoprolol tartrate (LOPRESSOR) 25 MG tablet    Sig: Take 0.5 tablets (12.5 mg total) by mouth 2 (two) times daily for 30 days.    Dispense:  30 tablet    Refill:  6  . diltiazem (CARDIZEM CD) 300 MG 24 hr capsule    Sig: Take 1 capsule (300 mg total) by mouth daily for 30 days.    Dispense:  30 capsule    Refill:  6  . atorvastatin (LIPITOR) 80 MG tablet    Sig: Take 1 tablet (80 mg total) by mouth daily at 6 PM.    Dispense:  30 tablet    Refill:  6    Follow-up: Return in about 3 months (around 12/29/2018) for Follow-up of chronic medical conditions.   [name] MD

## 2018-09-30 LAB — CMP14+EGFR
A/G RATIO: 1.2 (ref 1.2–2.2)
ALK PHOS: 104 IU/L (ref 39–117)
ALT: 18 IU/L (ref 0–32)
AST: 20 IU/L (ref 0–40)
Albumin: 4 g/dL (ref 3.5–5.5)
BILIRUBIN TOTAL: 0.4 mg/dL (ref 0.0–1.2)
BUN/Creatinine Ratio: 16 (ref 9–23)
BUN: 14 mg/dL (ref 6–24)
CHLORIDE: 104 mmol/L (ref 96–106)
CO2: 20 mmol/L (ref 20–29)
Calcium: 9.6 mg/dL (ref 8.7–10.2)
Creatinine, Ser: 0.85 mg/dL (ref 0.57–1.00)
GFR calc Af Amer: 90 mL/min/{1.73_m2} (ref >59)
GFR calc non Af Amer: 78 mL/min/{1.73_m2} (ref >59)
GLOBULIN, TOTAL: 3.4 g/dL (ref 1.5–4.5)
Glucose: 102 mg/dL — ABNORMAL HIGH (ref 65–99)
POTASSIUM: 4 mmol/L (ref 3.5–5.2)
SODIUM: 143 mmol/L (ref 134–144)
Total Protein: 7.4 g/dL (ref 6.0–8.5)

## 2018-09-30 LAB — LIPID PANEL
CHOL/HDL RATIO: 2.5 ratio (ref 0.0–4.4)
CHOLESTEROL TOTAL: 118 mg/dL (ref 100–199)
HDL: 48 mg/dL (ref >39)
LDL Calculated: 57 mg/dL (ref 0–99)
TRIGLYCERIDES: 67 mg/dL (ref 0–149)
VLDL Cholesterol Cal: 13 mg/dL (ref 5–40)

## 2018-09-30 LAB — VITAMIN D 25 HYDROXY (VIT D DEFICIENCY, FRACTURES): Vit D, 25-Hydroxy: 9.2 ng/mL — ABNORMAL LOW (ref 30.0–100.0)

## 2018-09-30 MED ORDER — ERGOCALCIFEROL 1.25 MG (50000 UT) PO CAPS: 50000.0000 [IU] | ORAL_CAPSULE | ORAL | 1 refills | Status: DC

## 2018-09-30 MED FILL — VIT D2 1.25 MG (50,000 UNIT: 1.25 MG | 63 days supply | Qty: 9 | Fill #0

## 2018-10-05 ENCOUNTER — Ambulatory Visit: Payer: Self-pay | Attending: Critical Care Medicine | Admitting: Critical Care Medicine

## 2018-10-05 ENCOUNTER — Telehealth: Payer: Self-pay

## 2018-10-05 ENCOUNTER — Encounter: Payer: Self-pay | Admitting: Critical Care Medicine

## 2018-10-05 ENCOUNTER — Other Ambulatory Visit: Payer: Self-pay

## 2018-10-05 VITALS — BP 101/70 | HR 58 | Temp 98.3°F | Resp 16 | Ht 63.5 in | Wt 210.0 lb

## 2018-10-05 DIAGNOSIS — Z7901 Long term (current) use of anticoagulants: Secondary | ICD-10-CM | POA: Insufficient documentation

## 2018-10-05 DIAGNOSIS — Z880 Allergy status to penicillin: Secondary | ICD-10-CM | POA: Insufficient documentation

## 2018-10-05 DIAGNOSIS — G4733 Obstructive sleep apnea (adult) (pediatric): Secondary | ICD-10-CM | POA: Insufficient documentation

## 2018-10-05 DIAGNOSIS — Z803 Family history of malignant neoplasm of breast: Secondary | ICD-10-CM | POA: Insufficient documentation

## 2018-10-05 DIAGNOSIS — Z8249 Family history of ischemic heart disease and other diseases of the circulatory system: Secondary | ICD-10-CM | POA: Insufficient documentation

## 2018-10-05 DIAGNOSIS — Z79899 Other long term (current) drug therapy: Secondary | ICD-10-CM | POA: Insufficient documentation

## 2018-10-05 DIAGNOSIS — I10 Essential (primary) hypertension: Secondary | ICD-10-CM | POA: Insufficient documentation

## 2018-10-05 DIAGNOSIS — I472 Ventricular tachycardia: Secondary | ICD-10-CM | POA: Insufficient documentation

## 2018-10-05 DIAGNOSIS — Z833 Family history of diabetes mellitus: Secondary | ICD-10-CM | POA: Insufficient documentation

## 2018-10-05 DIAGNOSIS — R29898 Other symptoms and signs involving the musculoskeletal system: Secondary | ICD-10-CM

## 2018-10-05 DIAGNOSIS — I480 Paroxysmal atrial fibrillation: Secondary | ICD-10-CM | POA: Insufficient documentation

## 2018-10-05 DIAGNOSIS — Z87891 Personal history of nicotine dependence: Secondary | ICD-10-CM | POA: Insufficient documentation

## 2018-10-05 DIAGNOSIS — Z8673 Personal history of transient ischemic attack (TIA), and cerebral infarction without residual deficits: Secondary | ICD-10-CM | POA: Insufficient documentation

## 2018-10-05 NOTE — Assessment & Plan Note (Signed)
Significant sleep disordered  Breathing with an AHI of greater than 70  Note she does not have insurance or the ability to obtain a bilevel device at this time.  We can obtain for the patient a CPAP device that is refurbished and would be free to the patient.  An order for CPAP setting of 11 to 15 cm water pressure was obtained with a medium sized fullface mask  We will follow the patient up after this is obtained in 1 month

## 2018-10-05 NOTE — Progress Notes (Signed)
Subjective:    Patient ID: [name], female    DOB: 1966-07-26, 53 y.o.   MRN: [id number]  52 y.o.F here for f/u of OSA  AHI severe >70 bipap 17/13  recommend.  Self pay   The patient is here to discuss CPAP treatment options.  The patient is self-pay and has limited reimbursement at this time.  The patient works as a Programmer, applications.  The patient states her level of dyspnea is stable.  She does state there are periods when she has significant fatigue and sleepiness.  She does not fall asleep while operating a motor vehicle or watching TV.  She has occasional headaches.  She denies any memory loss.  Sleep study had been performed and showed a sleep disturbance index of greater than 70.  Bilevel therapy at a setting of 17 IPAP 13 EPAP was recommended.  Note with a CPAP setting of 10-15 range the patient had fair control of her sleep disorder   Shortness of Breath  This is a chronic problem. The problem has been rapidly improving. Pertinent negatives include no chest pain, leg swelling, rhinorrhea, sore throat or sputum production.   Past Medical History:  Diagnosis Date  . Clotting disorder ([place])    on xarelto  . Essential hypertension   . Heart murmur    a. 10/2015 Echo: EF 60-65%, no rwma, mild AI/MR, sev dil LA/RA, PASP 48mmHg.  [name] Noncompliance   . NSVT (nonsustained ventricular tachycardia) ([place])    a. 10/2015 during admission for CVA/AF.  [name] Paroxysmal A-fib ([place])    a. CHA2DS2VASC = 4-->Xarelto;  b. 02/2016 Successful DCCV.  [name] Pneumonia 2012 x 2; 2015  . Stroke [facility])    a. 03/4080 Embolic CVA of mid right middle cerebral atery - recieved TPA-->small amt of asymptomatic hemorrhagic transformation.  . Transient ischemic attack (TIA) 01/2013     Family History  Problem Relation Age of Onset  . Cancer Mother   . Atrial fibrillation Mother   . Breast cancer Mother   . Hypertension Father   . Atrial fibrillation Father   . Peripheral vascular disease Father   .  Hypertension Unknown   . Diabetes Unknown   . Heart attack Neg Hx   . Stroke Neg Hx   . Rectal cancer Neg Hx   . Colon cancer Neg Hx   . Esophageal cancer Neg Hx   . Stomach cancer Neg Hx      Social History   Socioeconomic History  . Marital status: Single    Spouse name: Not on file  . Number of children: Not on file  . Years of education: Not on file  . Highest education level: Not on file  Occupational History  . Occupation: Airline pilot: [employer]  . Occupation: in home health aide  Social Needs  . Financial resource strain: Not on file  . Food insecurity:    Worry: Not on file    Inability: Not on file  . Transportation needs:    Medical: Not on file    Non-medical: Not on file  Tobacco Use  . Smoking status: Former Smoker    Years: 3.00    Types: Cigarettes    Last attempt to quit: 03/15/2013    Years since quitting: 5.5  . Smokeless tobacco: Never Used  Substance and Sexual Activity  . Alcohol use: Yes    Comment: drinks on the weekends, 2 drinks and 2 beers  . Drug  use: No  . Sexual activity: Not Currently    Birth control/protection: None  Lifestyle  . Physical activity:    Days per week: Not on file    Minutes per session: Not on file  . Stress: Not on file  Relationships  . Social connections:    Talks on phone: Not on file    Gets together: Not on file    Attends religious service: Not on file    Active member of club or organization: Not on file    Attends meetings of clubs or organizations: Not on file    Relationship status: Not on file  . Intimate partner violence:    Fear of current or ex partner: Not on file    Emotionally abused: Not on file    Physically abused: Not on file    Forced sexual activity: Not on file  Other Topics Concern  . Not on file  Social History Narrative  . Not on file     Allergies  Allergen Reactions  . Penicillins Hives and Other (See Comments)    Unknown  Has patient had a PCN reaction  causing immediate rash, facial/tongue/throat swelling, SOB or lightheadedness with hypotension: No Has patient had a PCN reaction causing severe rash involving mucus membranes or skin necrosis: No Has patient had a PCN reaction that required hospitalization No Has patient had a PCN reaction occurring within the last 10 years: No If all of the above answers are "NO", then may proceed with Cephalosporin use.     Outpatient Medications Prior to Visit  Medication Sig Dispense Refill  . acetaminophen (TYLENOL) 500 MG tablet Take 500 mg by mouth every 6 (six) hours as needed for headache.    [name] atorvastatin (LIPITOR) 80 MG tablet Take 1 tablet (80 mg total) by mouth daily at 6 PM. 30 tablet 6  . butalbital-acetaminophen-caffeine (FIORICET, ESGIC) 50-325-40 MG tablet TAKE 1 TABLET BY MOUTH EVERY 12 HOURS AS NEEDED FOR HEADACHE. 40 tablet 0  . diclofenac sodium (VOLTAREN) 1 % GEL Apply 4 g topically 4 (four) times daily. 100 g 3  . diltiazem (CARDIZEM CD) 300 MG 24 hr capsule Take 1 capsule (300 mg total) by mouth daily for 30 days. 30 capsule 6  . ergocalciferol (DRISDOL) 1.25 MG (50000 UT) capsule Take 1 capsule (50,000 Units total) by mouth once a week. 9 capsule 1  . gabapentin (NEURONTIN) 300 MG capsule Take 1 capsule (300 mg total) by mouth 2 (two) times daily. 60 capsule 3  . methocarbamol (ROBAXIN) 500 MG tablet Take 1 tablet (500 mg total) by mouth every 8 (eight) hours as needed for muscle spasms. 90 tablet 1  . metoprolol tartrate (LOPRESSOR) 25 MG tablet Take 0.5 tablets (12.5 mg total) by mouth 2 (two) times daily for 30 days. 30 tablet 6  . rivaroxaban (XARELTO) 20 MG TABS tablet Take 1 tablet (20 mg total) by mouth daily with supper. 30 tablet 6  . hydrochlorothiazide (MICROZIDE) 12.5 MG capsule Take 1 capsule (12.5 mg total) by mouth daily. 90 capsule 3  . metroNIDAZOLE (FLAGYL) 500 MG tablet Take 1 tablet (500 mg total) by mouth 2 (two) times daily. (Patient not taking: Reported on  09/29/2018) 14 tablet 0   Facility-Administered Medications Prior to Visit  Medication Dose Route Frequency Provider Last Rate Last Dose  . 0.9 %  sodium chloride infusion  500 mL Intravenous Once Armbruster, Carlota Raspberry, MD          Review of Systems  Constitutional:       Notes headaches.   No loss of focus or memory loss   HENT: Negative for rhinorrhea and sore throat.   Respiratory: Positive for shortness of breath. Negative for sputum production.   Cardiovascular: Negative for chest pain and leg swelling.  Genitourinary: Positive for enuresis.  Neurological: Negative for seizures and syncope.       Notes daytime hypersomnolence  No falling asleep driving or watching TV         Objective:   Physical Exam Vitals:   10/05/18 1414  BP: 101/70  Pulse: (!) 58  Resp: 16  Temp: 98.3 F (36.8 C)  TempSrc: Oral  SpO2: 96%  Weight: 210 lb (95.3 kg)  Height: 5' 3.5" (1.613 m)    Gen: Pleasant, obese, in no distress,  normal affect  ENT: No lesions,  mouth clear,  oropharynx clear, no postnasal drip  Neck: No JVD, no TMG, no carotid bruits  Lungs: No use of accessory muscles, no dullness to percussion, clear without rales or rhonchi  Cardiovascular: RRR, heart sounds normal, no murmur or gallops, no peripheral edema  Abdomen: soft and NT, no HSM,  BS normal  Musculoskeletal: No deformities, no cyanosis or clubbing  Neuro: alert, non focal  Skin: Warm, no lesions or rashes  No results found.   Recent sleep study from November 2019 was reviewed     Assessment & Plan:  I personally reviewed all images and lab data in the [facility] system as well as any outside material available during this office visit and agree with the  radiology impressions.   Sleep apnea Significant sleep disordered  Breathing with an AHI of greater than 70  Note she does not have insurance or the ability to obtain a bilevel device at this time.  We can obtain for the patient a CPAP device that  is refurbished and would be free to the patient.  An order for CPAP setting of 11 to 15 cm water pressure was obtained with a medium sized fullface mask  We will follow the patient up after this is obtained in 1 month

## 2018-10-05 NOTE — Patient Instructions (Signed)
A CPAP machine was ordered through home care company  Our case manager will work with you on obtaining the CPAP machine with a medium sized fullface mask   Return to see Dr. Joya Gaskins in 1 month for follow-up

## 2018-10-05 NOTE — Telephone Encounter (Signed)
Met with the patient to discuss ordering CPAP through [facility] or a refurbished machine through [employer] ([employer]).  She currently does not have insurance to pay for the cost of a new CPAP machine. She has applied for medicaid and the application is pending. Despite the lack of insurance, she is interested in obtaining a new CPAP and would like to know the cost.  She was agreeable to having the order sent to [facility] for processing in order to determine the out of pocket cost.  If it is prohibitive, she is interested in ordering the machine through the [facility]. She signed the " Waiver and Release of Claims" for the [employer]AA in the event that she needs to obtain a machine from the [facility].

## 2018-10-06 ENCOUNTER — Telehealth: Payer: Self-pay

## 2018-10-06 NOTE — Telephone Encounter (Signed)
Application for CPAP faxed to [facility]

## 2018-10-07 ENCOUNTER — Other Ambulatory Visit: Payer: Self-pay | Admitting: Family Medicine

## 2018-10-07 MED FILL — HYDROCHLOROTHIAZIDE 12.5 MG: 12.5 | 30 days supply | Qty: 30 | Fill #3

## 2018-10-07 MED FILL — METHOCARBAMOL 500 MG TABS: 500 | 30 days supply | Qty: 90 | Fill #1

## 2018-10-07 MED FILL — GABAPENTIN 300 MG CAPSULE: 300 | 30 days supply | Qty: 60 | Fill #2

## 2018-10-08 MED FILL — BUTALB-ACETAMIN-CAFF 50-325: 50-325-40 | 20 days supply | Qty: 40 | Fill #0

## 2018-10-08 NOTE — Telephone Encounter (Signed)
RX sent on 09/21/18 was not received by the pharmacy.

## 2018-10-14 ENCOUNTER — Telehealth: Payer: Self-pay | Admitting: Family Medicine

## 2018-10-14 MED ORDER — BENZONATATE 100 MG PO CAPS: 100.0000 mg | ORAL_CAPSULE | Freq: Two times a day (BID) | ORAL | 0 refills | Status: DC | PRN

## 2018-10-14 MED FILL — BENZONATATE 100 MG CAP: 100 | 10 days supply | Qty: 20 | Fill #0

## 2018-10-14 NOTE — Telephone Encounter (Signed)
Tessalon Perles sent to the pharmacy.  If her cough persists but she does not feel better she will need to be seen.

## 2018-10-14 NOTE — Telephone Encounter (Signed)
Patient called stating that she has had pneumonia several times and she has a cough at this time. Patient would like to know if PCP can prescribe her an Rx for her cough. Patient states she does not think is very serious for her to go to the hospital. Please f/u

## 2018-10-14 NOTE — Telephone Encounter (Signed)
Will route to PCP for review. 

## 2018-10-15 ENCOUNTER — Telehealth: Payer: Self-pay

## 2018-10-15 NOTE — Telephone Encounter (Signed)
Call placed to [facility] to check on status of CPAP order.  Spoke to [place] who stated that they have not been able to reach the patient.    Call placed to patient. She said that [facility] did call her and informed her that she would need to pay $ 97/month for the machine and she is not able to afford that. She currently has no insurance. I discussed the CPAP assistance program with the [employer] ( [employer])  when she was in the clinic for her appointment and she has signed the waiver. The order can be processed after the provider signs the ASAA documentation.[name]

## 2018-10-15 NOTE — Telephone Encounter (Signed)
Patient was called and informed of medication being sent to pharmacy. 

## 2018-10-18 NOTE — Telephone Encounter (Signed)
Application for CPAP assistance program faxed to [employer].

## 2018-10-18 NOTE — Telephone Encounter (Signed)
Lets go with [employer].  Let me sign the papers  Thanks

## 2018-10-19 ENCOUNTER — Telehealth: Payer: Self-pay

## 2018-10-19 NOTE — Telephone Encounter (Signed)
Call placed to [employer] - CPAP assistance program. Spoke to [place] and $984 donation was paid by [facility] for patient's CPAP machine.

## 2018-10-26 ENCOUNTER — Telehealth: Payer: Self-pay

## 2018-10-26 NOTE — Telephone Encounter (Signed)
Call placed to the patient and confirmed an appointment for CPAP teaching for 11/09/2018 @ 1030 @ [facility].

## 2018-11-02 ENCOUNTER — Ambulatory Visit: Payer: Self-pay | Admitting: Critical Care Medicine

## 2018-11-05 ENCOUNTER — Telehealth: Payer: Self-pay | Admitting: *Deleted

## 2018-11-05 NOTE — Telephone Encounter (Signed)
Medical Assistant left message on patient's home and cell voicemail. Voicemail states to give a call back to [place] with [facility] at [phone]. Patient missed her 11/02/2018 appointment Please inquire about the no show reasoning and offer another appointment.

## 2018-11-09 ENCOUNTER — Telehealth: Payer: Self-pay

## 2018-11-09 NOTE — Telephone Encounter (Signed)
Patient met with Pamala Hurry and Carol/ Respiratory therapists with [facility] and was instructed regarding the use and care of the CPAP machine.  After successful  completion of the teaching she was given the machine to take home.

## 2018-11-11 MED FILL — CARTIA XT 300 MG CAPSULE SA: 300 | 30 days supply | Qty: 30 | Fill #1

## 2018-11-11 MED FILL — ATORVASTATIN 80 MG TABLET: 80 | 30 days supply | Qty: 30 | Fill #1

## 2018-11-11 MED FILL — BUTALB-ACETAMIN-CAFF 50-325: 50-325-40 | 20 days supply | Qty: 40 | Fill #1

## 2018-11-11 MED FILL — VIT D2 1.25 MG (50,000 UNIT: 1.25 MG | 63 days supply | Qty: 9 | Fill #1

## 2018-11-11 MED FILL — METOPROLOL TARTRATE 25 MG T: 25 | 30 days supply | Qty: 30 | Fill #1

## 2018-11-11 MED FILL — HYDROCHLOROTHIAZIDE 12.5 MG: 12.5 | 30 days supply | Qty: 30 | Fill #4

## 2018-11-11 MED FILL — XARELTO 20 MG TABLET: 20 | 30 days supply | Qty: 30 | Fill #2

## 2018-11-17 ENCOUNTER — Ambulatory Visit: Payer: Self-pay | Attending: Family Medicine | Admitting: Family Medicine

## 2018-11-17 ENCOUNTER — Encounter: Payer: Self-pay | Admitting: Family Medicine

## 2018-11-17 VITALS — BP 130/87 | HR 108 | Temp 97.7°F | Ht 63.5 in | Wt 213.0 lb

## 2018-11-17 DIAGNOSIS — G4733 Obstructive sleep apnea (adult) (pediatric): Secondary | ICD-10-CM

## 2018-11-17 DIAGNOSIS — I10 Essential (primary) hypertension: Secondary | ICD-10-CM

## 2018-11-17 DIAGNOSIS — R05 Cough: Secondary | ICD-10-CM

## 2018-11-17 DIAGNOSIS — I5032 Chronic diastolic (congestive) heart failure: Secondary | ICD-10-CM

## 2018-11-17 DIAGNOSIS — R059 Cough, unspecified: Secondary | ICD-10-CM

## 2018-11-17 DIAGNOSIS — I482 Chronic atrial fibrillation, unspecified: Secondary | ICD-10-CM

## 2018-11-17 MED ORDER — ALBUTEROL SULFATE HFA 108 (90 BASE) MCG/ACT IN AERS: 2.0000 | INHALATION_SPRAY | Freq: Four times a day (QID) | RESPIRATORY_TRACT | 2 refills | Status: DC | PRN

## 2018-11-17 MED ORDER — CETIRIZINE HCL 10 MG PO TABS: 10.0000 mg | ORAL_TABLET | Freq: Every day | ORAL | 1 refills | Status: DC

## 2018-11-17 MED FILL — !VENTOLIN HFA INHALER: 108 (90 BAS | 25 days supply | Qty: 18 | Fill #0

## 2018-11-17 NOTE — Progress Notes (Signed)
Subjective:  Patient ID: [name], female    DOB: 1965/10/07  Age: 53 y.o. MRN: [id number]  CC: Cough   HPI [name] is a 53 year old female with a history of atrial fibrillation/atrial flutter (status post cardioversion in 02/2016 currently on rate control with metoprolol and Cardizem and anticoagulation Xarelto s/p DCCV in 2017 ), cerebral infarction due to embolism of right middle cerebral artery status post IV TPA (in 10/2015),  right MCA stroke in 01/2018 (after running out of Xarelto) here for follow-up visit. [name] has had a cough for over 3 months which is sometimes dry at other times productive of sputum which ranges from clear to green and also yellow.  Cough is described as choking.  [name] has noticed that exposure to air from the heat or air from her CPAP machine makes her cough worse and as a result [name] has stopped using her CPAP machine due to the air even though it improved her quality of sleep.  Tessalon Perles provided minimal improvement. [name] moved into a new Apt. 1 month ago and the attic is above the hallway and [name] is sure if this contributes to her symptoms. Endorses a history of smoking for about 3 years but then quit in 2005.  Denies wheezing, dyspnea, pedal edema. Echo from 01/2018 revealed EF of 60 to 65%, no regional wall motion abnormalities. Compliant with Xarelto and denies bruising.  Also taking Cardizem and other medications and denies adverse effects.  Past Medical History:  Diagnosis Date  . Clotting disorder ([place])    on xarelto  . Essential hypertension   . Heart murmur    a. 10/2015 Echo: EF 60-65%, no rwma, mild AI/MR, sev dil LA/RA, PASP 3mmHg.  [name] Noncompliance   . NSVT (nonsustained ventricular tachycardia) ([place])    a. 10/2015 during admission for CVA/AF.  [name] Paroxysmal A-fib ([place])    a. CHA2DS2VASC = 4-->Xarelto;  b. 02/2016 Successful DCCV.  [name] Pneumonia 2012 x 2; 2015  . Stroke [facility])    a. 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  . Transient ischemic attack (TIA) 01/2013    Past Surgical History:  Procedure Laterality Date  . CARDIOVERSION N/A 02/18/2013   Procedure: CARDIOVERSION;  Surgeon: Sanda Klein, MD;  Location: [place] ENDOSCOPY;  Service: Cardiovascular;  Laterality: N/A;  . CARDIOVERSION N/A 02/23/2013   Procedure: CARDIOVERSION;  Surgeon: Leonie Man, MD;  Location: [place];  Service: Cardiovascular;  Laterality: N/A;  BESIDE CV  . CARDIOVERSION N/A 03/12/2016   Procedure: CARDIOVERSION;  Surgeon: Larey Dresser, MD;  Location: [place];  Service: Cardiovascular;  Laterality: N/A;  . TEE WITHOUT CARDIOVERSION N/A 02/18/2013   Procedure: TRANSESOPHAGEAL ECHOCARDIOGRAM (TEE);  Surgeon: Sanda Klein, MD;  Location: [facility] ENDOSCOPY;  Service: Cardiovascular;  Laterality: N/A;  . TUBAL LIGATION  1992    Family History  Problem Relation Age of Onset  . Cancer Mother   . Atrial fibrillation Mother   . Breast cancer Mother   . Hypertension Father   . Atrial fibrillation Father   . Peripheral vascular disease Father   . Hypertension Unknown   . Diabetes Unknown   . Heart attack Neg Hx   . Stroke Neg Hx   . Rectal cancer Neg Hx   . Colon cancer Neg Hx   . Esophageal cancer Neg Hx   . Stomach cancer Neg Hx     Allergies  Allergen Reactions  .  Penicillins Hives and Other (See Comments)    Unknown  Has patient had a PCN reaction causing immediate rash, facial/tongue/throat swelling, SOB or lightheadedness with hypotension: No Has patient had a PCN reaction causing severe rash involving mucus membranes or skin necrosis: No Has patient had a PCN reaction that required hospitalization No Has patient had a PCN reaction occurring within the last 10 years: No If all of the above answers are "NO", then may proceed with Cephalosporin use.    Outpatient Medications Prior to Visit  Medication Sig Dispense Refill  . acetaminophen (TYLENOL)  500 MG tablet Take 500 mg by mouth every 6 (six) hours as needed for headache.    [name] atorvastatin (LIPITOR) 80 MG tablet Take 1 tablet (80 mg total) by mouth daily at 6 PM. 30 tablet 6  . butalbital-acetaminophen-caffeine (FIORICET, ESGIC) 50-325-40 MG tablet TAKE 1 TABLET BY MOUTH EVERY 12 HOURS AS NEEDED FOR HEADACHE. 40 tablet 1  . diclofenac sodium (VOLTAREN) 1 % GEL Apply 4 g topically 4 (four) times daily. 100 g 3  . ergocalciferol (DRISDOL) 1.25 MG (50000 UT) capsule Take 1 capsule (50,000 Units total) by mouth once a week. 9 capsule 1  . gabapentin (NEURONTIN) 300 MG capsule Take 1 capsule (300 mg total) by mouth 2 (two) times daily. 60 capsule 3  . methocarbamol (ROBAXIN) 500 MG tablet Take 1 tablet (500 mg total) by mouth every 8 (eight) hours as needed for muscle spasms. 90 tablet 1  . rivaroxaban (XARELTO) 20 MG TABS tablet Take 1 tablet (20 mg total) by mouth daily with supper. 30 tablet 6  . benzonatate (TESSALON) 100 MG capsule Take 1 capsule (100 mg total) by mouth 2 (two) times daily as needed for cough. (Patient not taking: Reported on 11/17/2018) 20 capsule 0  . diltiazem (CARDIZEM CD) 300 MG 24 hr capsule Take 1 capsule (300 mg total) by mouth daily for 30 days. 30 capsule 6  . metoprolol tartrate (LOPRESSOR) 25 MG tablet Take 0.5 tablets (12.5 mg total) by mouth 2 (two) times daily for 30 days. 30 tablet 6   Facility-Administered Medications Prior to Visit  Medication Dose Route Frequency Provider Last Rate Last Dose  . 0.9 %  sodium chloride infusion  500 mL Intravenous Once Armbruster, Carlota Raspberry, MD         ROS Review of Systems  Constitutional: Negative for activity change, appetite change and fatigue.  HENT: Negative for congestion, sinus pressure and sore throat.   Eyes: Negative for visual disturbance.  Respiratory: Positive for cough. Negative for chest tightness, shortness of breath and wheezing.   Cardiovascular: Negative for chest pain and palpitations.    Gastrointestinal: Negative for abdominal distention, abdominal pain and constipation.  Endocrine: Negative for polydipsia.  Genitourinary: Negative for dysuria and frequency.  Musculoskeletal: Negative for arthralgias and back pain.  Skin: Negative for rash.  Neurological: Negative for tremors, light-headedness and numbness.  Hematological: Does not bruise/bleed easily.  Psychiatric/Behavioral: Negative for agitation and behavioral problems.    Objective:  BP 130/87   Pulse (!) 108   Temp 97.7 F (36.5 C) (Oral)   Ht 5' 3.5" (1.613 m)   Wt 213 lb (96.6 kg)   SpO2 99%   BMI 37.14 kg/m   BP/Weight 11/17/2018 10/05/2018 1/77/9390  Systolic BP 300 923 300  Diastolic BP 87 70 74  Wt. (Lbs) 213 210 210.4  BMI 37.14 36.62 36.69      Physical Exam Constitutional:      Appearance: [name]  is well-developed.  HENT:     Right Ear: Tympanic membrane normal.     Left Ear: Tympanic membrane normal.     Nose: Nose normal.  Cardiovascular:     Rate and Rhythm: Normal rate. Rhythm irregular.     Heart sounds: Normal heart sounds. No murmur.  Pulmonary:     Effort: Pulmonary effort is normal.     Breath sounds: Normal breath sounds. No wheezing or rales.  Chest:     Chest wall: No tenderness.  Abdominal:     General: Bowel sounds are normal. There is no distension.     Palpations: Abdomen is soft. There is no mass.     Tenderness: There is no abdominal tenderness.  Musculoskeletal: Normal range of motion.  Neurological:     Mental Status: [name] is alert and oriented to person, place, and time.     CMP Latest Ref Rng & Units 09/29/2018 08/23/2018 06/12/2018  Glucose 65 - 99 mg/dL 102(H) 81 104(H)  BUN 6 - 24 mg/dL 14 13 14   Creatinine 0.57 - 1.00 mg/dL 0.85 0.97 0.91  Sodium 134 - 144 mmol/L 143 142 138  Potassium 3.5 - 5.2 mmol/L 4.0 4.1 3.7  Chloride 96 - 106 mmol/L 104 102 106  CO2 20 - 29 mmol/L 20 24 24   Calcium 8.7 - 10.2 mg/dL 9.6 9.4 8.6(L)  Total Protein 6.0 - 8.5 g/dL 7.4  - -  Total Bilirubin 0.0 - 1.2 mg/dL 0.4 - -  Alkaline Phos 39 - 117 IU/L 104 - -  AST 0 - 40 IU/L 20 - -  ALT 0 - 32 IU/L 18 - -    Lipid Panel     Component Value Date/Time   CHOL 118 09/29/2018 0959   TRIG 67 09/29/2018 0959   HDL 48 09/29/2018 0959   CHOLHDL 2.5 09/29/2018 0959   CHOLHDL 3.1 02/09/2018 0531   VLDL 17 02/09/2018 0531   LDLCALC 57 09/29/2018 0959    CBC    Component Value Date/Time   WBC 10.8 (H) 06/12/2018 0325   RBC 4.14 06/12/2018 0325   HGB 11.1 (L) 06/12/2018 0325   HCT 35.5 (L) 06/12/2018 0325   PLT 169 06/12/2018 0325   MCV 85.7 06/12/2018 0325   MCH 26.8 06/12/2018 0325   MCHC 31.3 06/12/2018 0325   RDW 14.7 06/12/2018 0325   LYMPHSABS 2.3 06/11/2018 0735   MONOABS 0.7 06/11/2018 0735   EOSABS 0.1 06/11/2018 0735   BASOSABS 0.1 06/11/2018 0735    Lab Results  Component Value Date   HGBA1C 6.1 (H) 02/09/2018    Assessment & Plan:   1. Essential hypertension Controlled Counseled on blood pressure goal of less than 130/80, low-sodium, DASH diet, medication compliance, 150 minutes of moderate intensity exercise per week. Discussed medication compliance, adverse effects.  2. Chronic atrial fibrillation Currently on Xarelto for anticoagulation and rate control with Cardizem Advised to schedule appointment with cardiology  3. Chronic diastolic CHF (congestive heart failure) (HCC) EF 60 to 65% No evidence of fluid overload Continue current regimen  4. Cough Unknown etiology of cough Would love to evaluate for COPD with pulmonary function test however [name] has no medical coverage Advised to apply for the [place] financial discount to facilitate referral Trial of albuterol MDI - albuterol (PROVENTIL HFA;VENTOLIN HFA) 108 (90 Base) MCG/ACT inhaler; Inhale 2 puffs into the lungs every 6 (six) hours as needed for wheezing or shortness of breath.  Dispense: 1 Inhaler; Refill: 2 - cetirizine (ZYRTEC) 10  MG tablet; Take 1 tablet (10 mg  total) by mouth daily.  Dispense: 30 tablet; Refill: 1  5. Obstructive sleep apnea syndrome Noncompliant with CPAP machine due to ongoing cough as a result of the air pressure. Will schedule with Dr Joya Gaskins to review CPAP settings   Meds ordered this encounter  Medications  . albuterol (PROVENTIL HFA;VENTOLIN HFA) 108 (90 Base) MCG/ACT inhaler    Sig: Inhale 2 puffs into the lungs every 6 (six) hours as needed for wheezing or shortness of breath.    Dispense:  1 Inhaler    Refill:  2  . cetirizine (ZYRTEC) 10 MG tablet    Sig: Take 1 tablet (10 mg total) by mouth daily.    Dispense:  30 tablet    Refill:  1    Follow-up: Return in about 2 weeks (around 12/01/2018) for Dr Joya Gaskins - CPAP, cough; PCP 3 months.       [name], MD, FAAFP. [facility] and [place], [place]   11/17/2018, 12:39 PM

## 2018-11-17 NOTE — Progress Notes (Signed)
C/C: cough

## 2018-12-10 MED FILL — XARELTO 20 MG TABLET: 20 | 30 days supply | Qty: 30 | Fill #3

## 2018-12-13 NOTE — Progress Notes (Addendum)
Virtual Visit via Telephone Note  I connected with [name] on 12/13/18 at  8:30 AM EDT by telephone and verified that I am speaking with the correct person using two identifiers.   I discussed the limitations, risks, security and privacy concerns of performing an evaluation and management service by telephone and the availability of in person appointments. I also discussed with the patient that there may be a patient responsible charge related to this service. The patient expressed understanding and agreed to proceed.   History of Present Illness: This is a 53 year old female who I saw today through a telephone visit.  I confirmed that the patient was correct and there were no other individuals on the phone call.  The patient has a history of atrial fibrillation on chronic Cardizem and Xarelto, cerebral infarction from embolism, severe obstructive sleep apnea with an AHI of greater than 70.  I had seen the patient previously in January of this year to set up a CPAP machine.  The patient needed a bilevel device with a setting of 17 cm IPAP and 11 cm EPAP however the patient had no insurance so we used the donated CPAP machine that was refurbished that was at a fixed setting of 11 cm water pressure ramping up to 20 cm.  The patient has a full facemask.  She had been experiencing a cough over the past 3 months which is dry and other times productive of sputum.  She has postnasal drainage.  She notes her heart rate is satisfactory she does have some constipation she states her weight is stable she has occasional pain in the legs but no edema.  The patient works as a Magazine features editor in peoples homes.  She denies any Covid exposures.  She has had no fever.  She has some sinus headache.  There is slight wheezing with exertion.  At her last visit with Dr. Margarita Rana: She was prescribed Zyrtec and albuterol.  The patient's not using the Zyrtec as she did not pick up this prescription.  She does use the albuterol as  needed.  She states it does give some relief. The patient had no other stated concerns.  The patient states she now has Medicaid and is interested in obtaining a commercially available machine from a durable medical equipment company  Observations/Objective: As this was a telephone visit no observations were made  Assessment and Plan: #1 obstructive sleep apnea severe with AHI greater than 70 and poor compliance with a fixed dose CPAP machine, the patient likely has allergic rhinitis which is impacting some of the compliance issues   Plan here will be to obtain for the patient a bilevel device with 17 cm IPAP and 11 cm EPAP with heated humidity obtained through a local DME company now the patient has Medicaid , would like a 30-day download report  #2 allergic rhinitis with sinus congestion and cough   Plan here will be for the patient to use the albuterol inhaler as needed and I gave her instructions as to the proper use of an HFA device over the phone.  Also the patient will begin fluticasone 2 sprays each nostril daily and cetirizine 1  10 mg tablet daily these were sent to our  pharmacy and will be mailed to the patient  Follow Up Instructions: The patient understands and with teach back acknowledged that she is to receive the Flonase and Zyrtec from our pharmacy through the mail and her address was corrected in epic as it was  incorrect.  She understands that we will attempt to get her AutoSet CPAP machine from a DME company  I discussed the assessment and treatment plan with the patient. The patient was provided an opportunity to ask questions and all were answered. The patient agreed with the plan and demonstrated an understanding of the instructions.   The patient was advised to call back or seek an in-person evaluation if the symptoms worsen or if the condition fails to improve as anticipated.  I provided 25  minutes of non-face-to-face time during this encounter.   Asencion Noble, MD

## 2018-12-14 ENCOUNTER — Other Ambulatory Visit: Payer: Self-pay

## 2018-12-14 ENCOUNTER — Ambulatory Visit: Payer: Medicaid Other | Attending: Critical Care Medicine | Admitting: Critical Care Medicine

## 2018-12-14 ENCOUNTER — Encounter: Payer: Self-pay | Admitting: Critical Care Medicine

## 2018-12-14 DIAGNOSIS — R05 Cough: Secondary | ICD-10-CM

## 2018-12-14 DIAGNOSIS — J301 Allergic rhinitis due to pollen: Secondary | ICD-10-CM | POA: Insufficient documentation

## 2018-12-14 DIAGNOSIS — G4733 Obstructive sleep apnea (adult) (pediatric): Secondary | ICD-10-CM

## 2018-12-14 DIAGNOSIS — Z9989 Dependence on other enabling machines and devices: Secondary | ICD-10-CM

## 2018-12-14 DIAGNOSIS — R059 Cough, unspecified: Secondary | ICD-10-CM | POA: Insufficient documentation

## 2018-12-14 MED ORDER — CETIRIZINE HCL 10 MG PO TABS: 10.0000 mg | ORAL_TABLET | Freq: Every day | ORAL | 6 refills | Status: DC

## 2018-12-14 MED ORDER — FLUTICASONE PROPIONATE 50 MCG/ACT NA SUSP: 2.0000 | Freq: Every day | NASAL | 6 refills | Status: DC

## 2018-12-14 MED FILL — CETIRIZINE HCL 10 MG TABS: 10 | 90 days supply | Qty: 90 | Fill #0

## 2018-12-14 MED FILL — FLUTICASONE PROP 50 MCG SPR: 50 | 30 days supply | Qty: 16 | Fill #0

## 2018-12-14 NOTE — Addendum Note (Signed)
Addended by: [name] Stain on: 12/14/2018 04:21 PM   Modules accepted: Orders

## 2018-12-15 ENCOUNTER — Telehealth: Payer: Self-pay

## 2018-12-15 NOTE — Telephone Encounter (Signed)
Call placed to the patient to inquire if she has a preference for home DME companies to provide her BiPAP and she stated that she has no preference.   Referral faxed to [place]

## 2018-12-23 ENCOUNTER — Emergency Department (HOSPITAL_COMMUNITY): Payer: Medicaid Other

## 2018-12-23 ENCOUNTER — Emergency Department (HOSPITAL_COMMUNITY)
Admission: EM | Admit: 2018-12-23 | Discharge: 2018-12-23 | Disposition: A | Discharge status: Home / Self Care | Payer: Medicaid Other | Attending: Emergency Medicine | Admitting: Emergency Medicine

## 2018-12-23 ENCOUNTER — Encounter (HOSPITAL_COMMUNITY): Payer: Self-pay | Admitting: Emergency Medicine

## 2018-12-23 ENCOUNTER — Other Ambulatory Visit: Payer: Self-pay

## 2018-12-23 DIAGNOSIS — S060X0A Concussion without loss of consciousness, initial encounter: Secondary | ICD-10-CM

## 2018-12-23 DIAGNOSIS — Z87891 Personal history of nicotine dependence: Secondary | ICD-10-CM | POA: Diagnosis not present

## 2018-12-23 DIAGNOSIS — Y999 Unspecified external cause status: Secondary | ICD-10-CM | POA: Insufficient documentation

## 2018-12-23 DIAGNOSIS — Z79899 Other long term (current) drug therapy: Secondary | ICD-10-CM | POA: Insufficient documentation

## 2018-12-23 DIAGNOSIS — Y93E5 Activity, floor mopping and cleaning: Secondary | ICD-10-CM | POA: Diagnosis not present

## 2018-12-23 DIAGNOSIS — S0990XA Unspecified injury of head, initial encounter: Secondary | ICD-10-CM | POA: Diagnosis not present

## 2018-12-23 DIAGNOSIS — Y92009 Unspecified place in unspecified non-institutional (private) residence as the place of occurrence of the external cause: Secondary | ICD-10-CM | POA: Insufficient documentation

## 2018-12-23 DIAGNOSIS — I110 Hypertensive heart disease with heart failure: Secondary | ICD-10-CM | POA: Insufficient documentation

## 2018-12-23 DIAGNOSIS — R51 Headache: Secondary | ICD-10-CM | POA: Diagnosis not present

## 2018-12-23 DIAGNOSIS — I5032 Chronic diastolic (congestive) heart failure: Secondary | ICD-10-CM | POA: Diagnosis not present

## 2018-12-23 DIAGNOSIS — Z8673 Personal history of transient ischemic attack (TIA), and cerebral infarction without residual deficits: Secondary | ICD-10-CM | POA: Diagnosis not present

## 2018-12-23 DIAGNOSIS — Z7901 Long term (current) use of anticoagulants: Secondary | ICD-10-CM | POA: Insufficient documentation

## 2018-12-23 DIAGNOSIS — W228XXA Striking against or struck by other objects, initial encounter: Secondary | ICD-10-CM | POA: Diagnosis not present

## 2018-12-23 NOTE — ED Triage Notes (Signed)
Pt here from home. Pt had a window that fell on her head while cleaning

## 2018-12-23 NOTE — ED Provider Notes (Signed)
[facility] EMERGENCY DEPARTMENT Provider Note   CSN: [id number] Arrival date & time: 12/23/18  1610    History   Chief Complaint No chief complaint on file.   HPI [name] is a 53 y.o. female.     The history is provided by the patient. No language interpreter was used.  Head Injury  Location:  Frontal Time since incident:  2 hours Pain details:    Quality:  Sharp   Severity:  Moderate   Timing:  Constant   Progression:  Unchanged Chronicity:  New Relieved by:  Nothing Worsened by:  Nothing Ineffective treatments:  None tried Associated symptoms: headache   Associated symptoms: no nausea, no neck pain, no numbness and no vomiting     Past Medical History:  Diagnosis Date  . Clotting disorder ([place])    on xarelto  . Essential hypertension   . Heart murmur    a. 10/2015 Echo: EF 60-65%, no rwma, mild AI/MR, sev dil LA/RA, PASP 15mmHg.  [name] Noncompliance   . NSVT (nonsustained ventricular tachycardia) ([place])    a. 10/2015 during admission for CVA/AF.  [name] Paroxysmal A-fib ([place])    a. CHA2DS2VASC = 4-->Xarelto;  b. 02/2016 Successful DCCV.  [name] Pneumonia 2012 x 2; 2015  . Stroke [facility])    a. 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  . Transient ischemic attack (TIA) 01/2013    Patient Active Problem List   Diagnosis Date Noted  . Seasonal allergic rhinitis due to pollen 12/14/2018  . Cough 12/14/2018  . Chronic back pain 08/23/2018  . Chronic diastolic CHF (congestive heart failure) ([place]) 06/23/2018  . OSA on CPAP 06/23/2018  . Acute ischemic right MCA stroke ([place]) 02/08/2018  . Headache 01/29/2017  . Refractive errors 04/15/2016  . Medical non-compliance 11/06/2015  . Hyperlipidemia LDL goal <70 11/06/2015  . 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 NSVT (nonsustained ventricular tachycardia) ([place]) 11/06/2015  . History of CVA (cerebrovascular accident)   .  Claudication of both lower extremities ([place]) 03/22/2015  . Chronic atrial fibrillation 04/12/2014  . Pap smear for cervical cancer screening 03/28/2014  . Cigarette smoker 06/15/2013  . Ventral hernia 06/15/2013  . Chronic anticoagulation 02/28/2013  . Hx-TIA (transient ischemic attack) 02/05/13 02/15/2013  . Essential hypertension 02/15/2013    Past Surgical History:  Procedure Laterality Date  . CARDIOVERSION N/A 02/18/2013   Procedure: CARDIOVERSION;  Surgeon: Sanda Klein, MD;  Location: [facility];  Service: Cardiovascular;  Laterality: N/A;  . CARDIOVERSION N/A 02/23/2013   Procedure: CARDIOVERSION;  Surgeon: Leonie Man, MD;  Location: [facility];  Service: Cardiovascular;  Laterality: N/A;  BESIDE CV  . CARDIOVERSION N/A 03/12/2016   Procedure: CARDIOVERSION;  Surgeon: Larey Dresser, MD;  Location: [facility];  Service: Cardiovascular;  Laterality: N/A;  . TEE WITHOUT CARDIOVERSION N/A 02/18/2013   Procedure: TRANSESOPHAGEAL ECHOCARDIOGRAM (TEE);  Surgeon: Sanda Klein, MD;  Location: [facility] ENDOSCOPY;  Service: Cardiovascular;  Laterality: N/A;  . TUBAL LIGATION  1992     OB History   No obstetric history on file.      Home Medications    Prior to Admission medications   Medication Sig Start Date End Date Taking? Authorizing Provider  acetaminophen (TYLENOL) 500 MG tablet Take 500 mg by mouth every 6 (six) hours as needed for headache.    [provider]  albuterol (PROVENTIL HFA;VENTOLIN HFA) 108 (90 Base) MCG/ACT inhaler Inhale 2 puffs into the  lungs every 6 (six) hours as needed for wheezing or shortness of breath. 11/17/18   [name], MD  atorvastatin (LIPITOR) 80 MG tablet Take 1 tablet (80 mg total) by mouth daily at 6 PM. 09/29/18   [name], MD  benzonatate (TESSALON) 100 MG capsule Take 1 capsule (100 mg total) by mouth 2 (two) times daily as needed for cough. 10/14/18   [name], MD  butalbital-acetaminophen-caffeine (FIORICET, ESGIC)  50-325-40 MG tablet TAKE 1 TABLET BY MOUTH EVERY 12 HOURS AS NEEDED FOR HEADACHE. 10/08/18   [name], MD  cetirizine (ZYRTEC) 10 MG tablet Take 1 tablet (10 mg total) by mouth daily. 12/14/18   [name] Stain, MD  diclofenac sodium (VOLTAREN) 1 % GEL Apply 4 g topically 4 (four) times daily. 06/29/18   [name], MD  diltiazem (CARDIZEM CD) 300 MG 24 hr capsule Take 1 capsule (300 mg total) by mouth daily for 30 days. 09/29/18 10/29/18  [name], MD  ergocalciferol (DRISDOL) 1.25 MG (50000 UT) capsule Take 1 capsule (50,000 Units total) by mouth once a week. 09/30/18   [name], MD  fluticasone (FLONASE) 50 MCG/ACT nasal spray Place 2 sprays into both nostrils daily. 12/14/18   [name] Stain, MD  gabapentin (NEURONTIN) 300 MG capsule Take 1 capsule (300 mg total) by mouth 2 (two) times daily. 04/14/18   [name], MD  methocarbamol (ROBAXIN) 500 MG tablet Take 1 tablet (500 mg total) by mouth every 8 (eight) hours as needed for muscle spasms. 04/14/18   [name], MD  metoprolol tartrate (LOPRESSOR) 25 MG tablet Take 0.5 tablets (12.5 mg total) by mouth 2 (two) times daily for 30 days. 09/29/18 10/29/18  [name], MD  rivaroxaban (XARELTO) 20 MG TABS tablet Take 1 tablet (20 mg total) by mouth daily with supper. 09/29/18   [name], MD    Family History Family History  Problem Relation Age of Onset  . Cancer Mother   . Atrial fibrillation Mother   . Breast cancer Mother   . Hypertension Father   . Atrial fibrillation Father   . Peripheral vascular disease Father   . Hypertension Unknown   . Diabetes Unknown   . Heart attack Neg Hx   . Stroke Neg Hx   . Rectal cancer Neg Hx   . Colon cancer Neg Hx   . Esophageal cancer Neg Hx   . Stomach cancer Neg Hx     Social History Social History   Tobacco Use  . Smoking status: Former Smoker    Years: 3.00    Types: Cigarettes    Last attempt to quit: 03/15/2013    Years since quitting: 5.7   . Smokeless tobacco: Never Used  Substance Use Topics  . Alcohol use: Yes    Comment: drinks on the weekends, 2 drinks and 2 beers  . Drug use: No     Allergies   Penicillins   Review of Systems Review of Systems  Constitutional: Negative for chills, diaphoresis, fatigue and fever.  HENT: Negative for congestion.   Respiratory: Negative for cough, chest tightness, shortness of breath and wheezing.   Cardiovascular: Negative for chest pain, palpitations and leg swelling.  Gastrointestinal: Negative for constipation, diarrhea, nausea and vomiting.  Genitourinary: Negative for flank pain.  Musculoskeletal: Negative for back pain, neck pain and neck stiffness.  Skin: Negative for rash and wound.  Neurological: Positive for headaches. Negative for weakness, light-headedness and numbness.  Psychiatric/Behavioral: Negative for agitation.  All other systems  reviewed and are negative.    Physical Exam Updated Vital Signs BP 96/63 (BP Location: Right Arm)   Pulse (!) 55   Temp 98 F (36.7 C) (Oral)   Resp 16   Ht 5\' 3"  (1.6 m)   Wt 95.3 kg   SpO2 96%   BMI 37.20 kg/m   Physical Exam Vitals signs and nursing note reviewed.  Constitutional:      General: She is not in acute distress.    Appearance: She is well-developed. She is not ill-appearing, toxic-appearing or diaphoretic.  HENT:     Head: Normocephalic and atraumatic.     Right Ear: External ear normal.     Left Ear: External ear normal.     Nose: Nose normal. No congestion or rhinorrhea.     Mouth/Throat:     Pharynx: No oropharyngeal exudate.  Eyes:     Conjunctiva/sclera: Conjunctivae normal.     Pupils: Pupils are equal, round, and reactive to light.  Neck:     Musculoskeletal: Normal range of motion and neck supple. No muscular tenderness.  Cardiovascular:     Rate and Rhythm: Normal rate.     Pulses: Normal pulses.  Pulmonary:     Effort: No respiratory distress.     Breath sounds: No stridor. No  wheezing, rhonchi or rales.  Chest:     Chest wall: No tenderness.  Abdominal:     General: There is no distension.     Tenderness: There is no abdominal tenderness. There is no right CVA tenderness, left CVA tenderness or rebound.  Musculoskeletal:        General: No swelling, tenderness or deformity.     Right lower leg: No edema.     Left lower leg: No edema.  Skin:    General: Skin is warm.     Capillary Refill: Capillary refill takes less than 2 seconds.     Findings: No erythema or rash.  Neurological:     General: No focal deficit present.     Mental Status: She is alert and oriented to person, place, and time.     GCS: GCS eye subscore is 4. GCS verbal subscore is 5. GCS motor subscore is 6.     Cranial Nerves: No cranial nerve deficit, dysarthria or facial asymmetry.     Sensory: No sensory deficit.     Motor: No weakness, tremor or abnormal muscle tone.     Coordination: Coordination normal.     Deep Tendon Reflexes: Reflexes are normal and symmetric.  Psychiatric:        Mood and Affect: Mood normal.      ED Treatments / Results  Labs (all labs ordered are listed, but only abnormal results are displayed) Labs Reviewed - No data to display  EKG None  Radiology Ct Head Wo Contrast  Result Date: 12/23/2018 CLINICAL DATA:  Pain after window fell on patient's head EXAM: CT HEAD WITHOUT CONTRAST TECHNIQUE: Contiguous axial images were obtained from the base of the skull through the vertex without intravenous contrast. COMPARISON:  Head CT Feb 08, 2018 and brain MRI Feb 08, 2018 FINDINGS: Brain: The ventricles are normal in size and configuration. There is no intracranial mass, hemorrhage, extra-axial fluid collection, or midline shift. There is evidence of a prior infarct in the right parietal lobe region as well as in the right temporal-occipital junction region, stable. No acute appearing infarct is evident. Vascular: No hyperdense vessel. There is calcification in each  carotid siphon region. Skull:  The bony calvarium appears intact. Sinuses/Orbits: Visualized paranasal sinuses are clear. Orbits appear symmetric bilaterally. Other: Mastoid air cells are clear. IMPRESSION: Prior right parietal as well as right posterior temporal-occipital junction infarcts, unchanged. No acute infarct evident. No mass or hemorrhage. There are foci of arterial vascular calcification. Electronically Signed   By: Lowella Grip III M.D.   On: 12/23/2018 18:09    Procedures Procedures (including critical care time)  Medications Ordered in ED Medications - No data to display   Initial Impression / Assessment and Plan / ED Course  I have reviewed the triage vital signs and the nursing notes.  Pertinent labs & imaging results that were available during my care of the patient were reviewed by me and considered in my medical decision making (see chart for details).        XOLANI DEGRACIA is a 53 y.o. female with a past medical history significant for paroxysmal atrial fibrillation and clotting disorder on Xarelto, hypertension, prior stroke and TIA, and hyperlipidemia who presents with headache.  Patient reports that she was cleaning her windows today when the storm windowpane fell forward struck her in the head.  She reports moderate headache at onset which has been improving throughout the day.  She reports she has been doing more sleepy and thus due to her Xarelto use, want to be evaluated for intracranial hemorrhage.  She denies nausea vomiting, vision changes, numbness, tingling, weakness.  She denies coordination difficulties.  She reports she has minimal symptoms left over from her stroke.  She denies any difficulty speaking, neck pain, or neck stiffness.  She reports her headache is now a 2 or 3 out of 10 in mild.  Due to the sleepiness she wanted to be seen.  On exam, patient had no focal neurologic deficits.  Normal coordination, sensation, and strength.  Pupils were symmetric  and reactive with normal extraocular movements.  Minimal tenderness on her left upper forehead with no laceration.  Lungs clear chest nontender.  Neck nontender.  Back nontender.  Patient resting comfortably.  Due to patient's anticoagulant use, direct head trauma, headache, and the mild sleepiness, patient will have head CT to look for intracranial hemorrhage.  Suspect mild concussion.  If CT is reassuring, anticipate discharge home with outpatient follow-up.      6:24 PM CT scan showed no fracture or intracranial hemorrhage.  Prior stroke was seen with no significant changes.  Given reassuring imaging, suspect patient had a concussion with the head injury.  As patient is feeling better and has no focal neurologic deficits, we feel she is safe to discharge home.  Patient instructed to use over-the-counter medication such as Tylenol to help with her residual headache and to rest and stay hydrated.  Patient given instructions on concussion management and outpatient follow-up.  Patient other questions or concerns and was discharged in good condition.   Final Clinical Impressions(s) / ED Diagnoses   Final diagnoses:  Injury of head, initial encounter  Concussion without loss of consciousness, initial encounter    ED Discharge Orders    None     Clinical Impression: 1. Injury of head, initial encounter   2. Concussion without loss of consciousness, initial encounter     Disposition: Discharge  Condition: Good  I have discussed the results, Dx and Tx plan with the pt(& family if present). He/she/they expressed understanding and agree(s) with the plan. Discharge instructions discussed at great length. Strict return precautions discussed and pt &/or family have verbalized understanding  of the instructions. No further questions at time of discharge.    New Prescriptions   No medications on file    Follow Up: [name], MD [place] [address] [phone]      [place] [address] [id number] mc [place]       , Gwenyth Allegra, MD 12/23/18 [phone]

## 2018-12-23 NOTE — Discharge Instructions (Signed)
Your history and exam today are consistent with concussion.  Due to your blood thinner use and prior stroke, we obtained a head CT which showed no new skull fracture or intracranial bleeding.  We saw her old strokes which appear unchanged.  Given your improving symptoms, we feel you are safe for discharge home.  Please stay hydrated and rest.  Please use Tylenol for pain if needed.  If any symptoms change or worsen drastically, please return to the nearest emergency department.

## 2018-12-24 DIAGNOSIS — R0683 Snoring: Secondary | ICD-10-CM | POA: Diagnosis not present

## 2018-12-24 DIAGNOSIS — G4733 Obstructive sleep apnea (adult) (pediatric): Secondary | ICD-10-CM | POA: Diagnosis not present

## 2018-12-24 DIAGNOSIS — R5383 Other fatigue: Secondary | ICD-10-CM | POA: Diagnosis not present

## 2018-12-29 ENCOUNTER — Telehealth: Payer: Self-pay

## 2018-12-29 NOTE — Telephone Encounter (Signed)
Call placed to [facility], spoke to [place] who confirmed that the Bi PAP was delivered on 12/24/2018.

## 2019-01-11 ENCOUNTER — Other Ambulatory Visit: Payer: Self-pay | Admitting: Family Medicine

## 2019-01-11 MED FILL — METOPROLOL TARTRATE 25 MG T: 25 | 90 days supply | Qty: 90 | Fill #2

## 2019-01-11 MED FILL — XARELTO 20 MG TABLET: 20 | 30 days supply | Qty: 30 | Fill #4

## 2019-01-11 MED FILL — CARTIA XT 300 MG CAPSULE SA: 300 | 90 days supply | Qty: 90 | Fill #2

## 2019-01-11 MED FILL — HYDROCHLOROTHIAZIDE 12.5 MG: 12.5 | 90 days supply | Qty: 90 | Fill #5

## 2019-01-12 ENCOUNTER — Other Ambulatory Visit: Payer: Self-pay

## 2019-01-12 ENCOUNTER — Other Ambulatory Visit: Payer: Self-pay | Admitting: Family Medicine

## 2019-01-12 MED ORDER — ERGOCALCIFEROL 1.25 MG (50000 UT) PO CAPS: 50000.0000 [IU] | ORAL_CAPSULE | ORAL | 0 refills | Status: DC

## 2019-01-12 MED ORDER — BUTALBITAL-APAP-CAFFEINE 50-325-40 MG PO TABS: ORAL_TABLET | ORAL | 1 refills | Status: DC

## 2019-01-12 MED FILL — VIT D2 1.25 MG (50,000 UNIT: 1.25 MG | 63 days supply | Qty: 9 | Fill #0

## 2019-01-12 MED FILL — ATORVASTATIN 80 MG TABLET: 80 | 30 days supply | Qty: 30 | Fill #2

## 2019-01-12 MED FILL — BUTALB-ACETAMIN-CAFF 50-325: 50-325-40 | 20 days supply | Qty: 40 | Fill #0

## 2019-01-14 DIAGNOSIS — R0683 Snoring: Secondary | ICD-10-CM | POA: Diagnosis not present

## 2019-01-14 DIAGNOSIS — R5383 Other fatigue: Secondary | ICD-10-CM | POA: Diagnosis not present

## 2019-01-14 DIAGNOSIS — G4733 Obstructive sleep apnea (adult) (pediatric): Secondary | ICD-10-CM | POA: Diagnosis not present

## 2019-01-23 DIAGNOSIS — G4733 Obstructive sleep apnea (adult) (pediatric): Secondary | ICD-10-CM | POA: Diagnosis not present

## 2019-01-23 DIAGNOSIS — R5383 Other fatigue: Secondary | ICD-10-CM | POA: Diagnosis not present

## 2019-01-23 DIAGNOSIS — R0683 Snoring: Secondary | ICD-10-CM | POA: Diagnosis not present

## 2019-01-26 ENCOUNTER — Other Ambulatory Visit: Payer: Self-pay

## 2019-01-26 ENCOUNTER — Encounter: Payer: Self-pay | Admitting: Family Medicine

## 2019-01-26 ENCOUNTER — Ambulatory Visit: Payer: Medicaid Other | Attending: Family Medicine | Admitting: Family Medicine

## 2019-01-26 ENCOUNTER — Telehealth: Payer: Self-pay

## 2019-01-26 VITALS — BP 136/85 | HR 84 | Temp 98.0°F | Ht 63.5 in | Wt 220.0 lb

## 2019-01-26 DIAGNOSIS — I10 Essential (primary) hypertension: Secondary | ICD-10-CM | POA: Diagnosis not present

## 2019-01-26 DIAGNOSIS — G43909 Migraine, unspecified, not intractable, without status migrainosus: Secondary | ICD-10-CM | POA: Insufficient documentation

## 2019-01-26 DIAGNOSIS — Z79899 Other long term (current) drug therapy: Secondary | ICD-10-CM | POA: Insufficient documentation

## 2019-01-26 DIAGNOSIS — Z8673 Personal history of transient ischemic attack (TIA), and cerebral infarction without residual deficits: Secondary | ICD-10-CM | POA: Diagnosis not present

## 2019-01-26 DIAGNOSIS — J039 Acute tonsillitis, unspecified: Secondary | ICD-10-CM | POA: Insufficient documentation

## 2019-01-26 DIAGNOSIS — W228XXA Striking against or struck by other objects, initial encounter: Secondary | ICD-10-CM | POA: Insufficient documentation

## 2019-01-26 DIAGNOSIS — Z7901 Long term (current) use of anticoagulants: Secondary | ICD-10-CM | POA: Diagnosis not present

## 2019-01-26 DIAGNOSIS — S060X0A Concussion without loss of consciousness, initial encounter: Secondary | ICD-10-CM | POA: Diagnosis not present

## 2019-01-26 DIAGNOSIS — Z803 Family history of malignant neoplasm of breast: Secondary | ICD-10-CM | POA: Insufficient documentation

## 2019-01-26 DIAGNOSIS — Z7951 Long term (current) use of inhaled steroids: Secondary | ICD-10-CM | POA: Insufficient documentation

## 2019-01-26 DIAGNOSIS — Z880 Allergy status to penicillin: Secondary | ICD-10-CM | POA: Diagnosis not present

## 2019-01-26 DIAGNOSIS — J029 Acute pharyngitis, unspecified: Secondary | ICD-10-CM | POA: Diagnosis present

## 2019-01-26 DIAGNOSIS — Z8249 Family history of ischemic heart disease and other diseases of the circulatory system: Secondary | ICD-10-CM | POA: Diagnosis not present

## 2019-01-26 DIAGNOSIS — M545 Low back pain, unspecified: Secondary | ICD-10-CM

## 2019-01-26 DIAGNOSIS — R51 Headache: Secondary | ICD-10-CM | POA: Diagnosis not present

## 2019-01-26 MED ORDER — METHOCARBAMOL 500 MG PO TABS: 500.0000 mg | ORAL_TABLET | Freq: Three times a day (TID) | ORAL | 1 refills | Status: DC | PRN

## 2019-01-26 MED ORDER — CLINDAMYCIN HCL 300 MG PO CAPS: 300.0000 mg | ORAL_CAPSULE | Freq: Two times a day (BID) | ORAL | 0 refills | Status: DC

## 2019-01-26 MED FILL — CLINDAMYCIN HCL 300 MG CAP: 300 | 5 days supply | Qty: 10 | Fill #0

## 2019-01-26 NOTE — Telephone Encounter (Signed)
Error

## 2019-01-26 NOTE — Progress Notes (Signed)
Subjective:  Patient ID: ATALAYA ZAPPIA, female    DOB: [birth year]  Age: 53 y.o. MRN: [id number]  CC: Sore Throat   HPI FLORRIE RAMIRES BRYNLEI KLAUSNER is a 53 year old female with a history of atrial fibrillation/atrial flutter (status post cardioversion in 02/2016 currently on rate control with metoprolol and Cardizem and anticoagulation Xarelto s/p DCCV in 2017 ), cerebral infarction due to embolism of right middle cerebral artery status post IV TPA (in 10/2015),  right MCA stroke in 01/2018 (after running out of Xarelto) here for acute visit.  She complains of noticing whitish inflamed spots on her tonsils with associated tonsillar erythema and this has been intermittent with associated throat pain but no fever and occur a couple of times a month.  She would usually gargle with Listerine.  Denies soreness of her gum or teeth.  Denies dyspnea, cough, myalgias, otalgia. She also noticed low back pain which is rated as mild to moderate for which she would use Tylenol.  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  5 weeks ago she had an ED visit where she was treated for concussion after a window fell on her head.   CT head revealed: IMPRESSION: Prior right parietal as well as right posterior temporal-occipital junction infarcts, unchanged. No acute infarct evident. No mass or hemorrhage.  There are foci of arterial vascular calcification.   She states she now has more headache compared to previously when she used Fioricet sparingly for chronic headaches.  She denies lightheadedness, photophobia, balance problems but does have some memory problems which is not new.  She is requesting referral to neurology as she never made it there for follow-up after her stroke due to lack of medical coverage.  Past Medical History:  Diagnosis Date  . Clotting disorder ([place])    on xarelto  . Essential hypertension   . Heart murmur    a. 10/2015 Echo: EF 60-65%, no  rwma, mild AI/MR, sev dil LA/RA, PASP 57mmHg.  [name] Noncompliance   . NSVT (nonsustained ventricular tachycardia) ([place])    a. 10/2015 during admission for CVA/AF.  [name] Paroxysmal A-fib ([place])    a. CHA2DS2VASC = 4-->Xarelto;  b. 02/2016 Successful DCCV.  [name] Pneumonia 2012 x 2; 2015  . Stroke [facility])    a. 04/2499 Embolic CVA of mid right middle cerebral atery - recieved TPA-->small amt of asymptomatic hemorrhagic transformation.  . Transient ischemic attack (TIA) 01/2013    Past Surgical History:  Procedure Laterality Date  . CARDIOVERSION N/A 02/18/2013   Procedure: CARDIOVERSION;  Surgeon: Sanda Klein, MD;  Location: [facility] ENDOSCOPY;  Service: Cardiovascular;  Laterality: N/A;  . CARDIOVERSION N/A 02/23/2013   Procedure: CARDIOVERSION;  Surgeon: Leonie Man, MD;  Location: [facility];  Service: Cardiovascular;  Laterality: N/A;  BESIDE CV  . CARDIOVERSION N/A 03/12/2016   Procedure: CARDIOVERSION;  Surgeon: Larey Dresser, MD;  Location: [place];  Service: Cardiovascular;  Laterality: N/A;  . TEE WITHOUT CARDIOVERSION N/A 02/18/2013   Procedure: TRANSESOPHAGEAL ECHOCARDIOGRAM (TEE);  Surgeon: Sanda Klein, MD;  Location: [facility] ENDOSCOPY;  Service: Cardiovascular;  Laterality: N/A;  . TUBAL LIGATION  1992    Family History  Problem Relation Age of Onset  . Cancer Mother   . Atrial fibrillation Mother   . Breast cancer Mother   . Hypertension Father   . Atrial fibrillation Father   . Peripheral vascular disease Father   . Hypertension Other   .  Diabetes Other   . Heart attack Neg Hx   . Stroke Neg Hx   . Rectal cancer Neg Hx   . Colon cancer Neg Hx   . Esophageal cancer Neg Hx   . Stomach cancer Neg Hx     Allergies  Allergen Reactions  . Penicillins Hives and Other (See Comments)    Unknown  Has patient had a PCN reaction causing immediate rash, facial/tongue/throat swelling, SOB or lightheadedness with hypotension: No Has patient had a PCN reaction causing severe rash involving  mucus membranes or skin necrosis: No Has patient had a PCN reaction that required hospitalization No Has patient had a PCN reaction occurring within the last 10 years: No If all of the above answers are "NO", then may proceed with Cephalosporin use.    Outpatient Medications Prior to Visit  Medication Sig Dispense Refill  . acetaminophen (TYLENOL) 500 MG tablet Take 500 mg by mouth every 6 (six) hours as needed for headache.    . albuterol (PROVENTIL HFA;VENTOLIN HFA) 108 (90 Base) MCG/ACT inhaler Inhale 2 puffs into the lungs every 6 (six) hours as needed for wheezing or shortness of breath. 1 Inhaler 2  . atorvastatin (LIPITOR) 80 MG tablet Take 1 tablet (80 mg total) by mouth daily at 6 PM. 30 tablet 6  . butalbital-acetaminophen-caffeine (FIORICET) 50-325-40 MG tablet Take on tablet every 12 hours as needed 40 tablet 1  . cetirizine (ZYRTEC) 10 MG tablet Take 1 tablet (10 mg total) by mouth daily. 30 tablet 6  . diclofenac sodium (VOLTAREN) 1 % GEL Apply 4 g topically 4 (four) times daily. 100 g 3  . ergocalciferol (DRISDOL) 1.25 MG (50000 UT) capsule Take 1 capsule (50,000 Units total) by mouth once a week. 9 capsule 0  . fluticasone (FLONASE) 50 MCG/ACT nasal spray Place 2 sprays into both nostrils daily. 16 g 6  . gabapentin (NEURONTIN) 300 MG capsule Take 1 capsule (300 mg total) by mouth 2 (two) times daily. 60 capsule 3  . rivaroxaban (XARELTO) 20 MG TABS tablet Take 1 tablet (20 mg total) by mouth daily with supper. 30 tablet 6  . methocarbamol (ROBAXIN) 500 MG tablet Take 1 tablet (500 mg total) by mouth every 8 (eight) hours as needed for muscle spasms. 90 tablet 1  . benzonatate (TESSALON) 100 MG capsule Take 1 capsule (100 mg total) by mouth 2 (two) times daily as needed for cough. (Patient not taking: Reported on 01/26/2019) 20 capsule 0  . diltiazem (CARDIZEM CD) 300 MG 24 hr capsule Take 1 capsule (300 mg total) by mouth daily for 30 days. 30 capsule 6  . metoprolol tartrate  (LOPRESSOR) 25 MG tablet Take 0.5 tablets (12.5 mg total) by mouth 2 (two) times daily for 30 days. 30 tablet 6   Facility-Administered Medications Prior to Visit  Medication Dose Route Frequency Provider Last Rate Last Dose  . 0.9 %  sodium chloride infusion  500 mL Intravenous Once Armbruster, Carlota Raspberry, MD         ROS Review of Systems  Constitutional: Negative for activity change, appetite change and fatigue.  HENT: Negative for congestion, sinus pressure and sore throat.   Eyes: Negative for visual disturbance.  Respiratory: Negative for cough, chest tightness, shortness of breath and wheezing.   Cardiovascular: Negative for chest pain and palpitations.  Gastrointestinal: Negative for abdominal distention, abdominal pain and constipation.  Endocrine: Negative for polydipsia.  Genitourinary: Negative for dysuria and frequency.  Musculoskeletal: Positive for back pain. Negative for  arthralgias.  Skin: Negative for rash.  Neurological: Negative for tremors, light-headedness and numbness.  Hematological: Does not bruise/bleed easily.  Psychiatric/Behavioral: Negative for agitation and behavioral problems.    Objective:  BP 136/85   Pulse 84   Temp 98 F (36.7 C) (Oral)   Ht 5' 3.5" (1.613 m)   Wt 220 lb (99.8 kg)   SpO2 98%   BMI 38.36 kg/m   BP/Weight 01/26/2019 10/24/5282 09/17/2438  Systolic BP 102 725 366  Diastolic BP 85 87 87  Wt. (Lbs) 220 210 213  BMI 38.36 37.2 37.14      Physical Exam Constitutional:      Appearance: She is well-developed.  HENT:     Nose:     Comments: Food pocket in tonsils    Mouth/Throat:     Pharynx: Oropharynx is clear.  Cardiovascular:     Rate and Rhythm: Normal rate.     Heart sounds: Normal heart sounds. No murmur.  Pulmonary:     Effort: Pulmonary effort is normal.     Breath sounds: Normal breath sounds. No wheezing or rales.  Chest:     Chest wall: No tenderness.  Abdominal:     General: Bowel sounds are normal. There is  no distension.     Palpations: Abdomen is soft. There is no mass.     Tenderness: There is no abdominal tenderness.  Musculoskeletal: Normal range of motion.  Neurological:     Mental Status: She is alert and oriented to person, place, and time.     CMP Latest Ref Rng & Units 09/29/2018 08/23/2018 06/12/2018  Glucose 65 - 99 mg/dL 102(H) 81 104(H)  BUN 6 - 24 mg/dL 14 13 14   Creatinine 0.57 - 1.00 mg/dL 0.85 0.97 0.91  Sodium 134 - 144 mmol/L 143 142 138  Potassium 3.5 - 5.2 mmol/L 4.0 4.1 3.7  Chloride 96 - 106 mmol/L 104 102 106  CO2 20 - 29 mmol/L 20 24 24   Calcium 8.7 - 10.2 mg/dL 9.6 9.4 8.6(L)  Total Protein 6.0 - 8.5 g/dL 7.4 - -  Total Bilirubin 0.0 - 1.2 mg/dL 0.4 - -  Alkaline Phos 39 - 117 IU/L 104 - -  AST 0 - 40 IU/L 20 - -  ALT 0 - 32 IU/L 18 - -    Lipid Panel     Component Value Date/Time   CHOL 118 09/29/2018 0959   TRIG 67 09/29/2018 0959   HDL 48 09/29/2018 0959   CHOLHDL 2.5 09/29/2018 0959   CHOLHDL 3.1 02/09/2018 0531   VLDL 17 02/09/2018 0531   LDLCALC 57 09/29/2018 0959    CBC    Component Value Date/Time   WBC 10.8 (H) 06/12/2018 0325   RBC 4.14 06/12/2018 0325   HGB 11.1 (L) 06/12/2018 0325   HCT 35.5 (L) 06/12/2018 0325   PLT 169 06/12/2018 0325   MCV 85.7 06/12/2018 0325   MCH 26.8 06/12/2018 0325   MCHC 31.3 06/12/2018 0325   RDW 14.7 06/12/2018 0325   LYMPHSABS 2.3 06/11/2018 0735   MONOABS 0.7 06/11/2018 0735   EOSABS 0.1 06/11/2018 0735   BASOSABS 0.1 06/11/2018 0735    Lab Results  Component Value Date   HGBA1C 6.1 (H) 02/09/2018    Assessment & Plan:   1. 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  Dispense: 90 tablet; Refill: 1  2. Tonsillitis No evidence of infection at this time but she does  have foot pockets which sometimes could get inflamed with retention of tonsillitis We will prescribe antibiotic for her to use in the event that she does have an infection which is absent at this time.  This is to prevent her from having 3 presents to the clinic with same symptoms due to the ongoing cough in 19 pandemic and limited examination of oropharyngeal cavity - clindamycin (CLEOCIN) 300 MG capsule; Take 1 capsule (300 mg total) by mouth 2 (two) times daily.  Dispense: 10 capsule; Refill: 0  3. History of CVA (cerebrovascular accident) No residual deficits at this time Will refer to neurology as per request - Ambulatory referral to Neurology  4. Concussion without loss of consciousness, initial encounter She does have intermittent headaches superimposed on her previous migraine No obvious focal deficits - Ambulatory referral to Neurology   Meds ordered this encounter  Medications  . clindamycin (CLEOCIN) 300 MG capsule    Sig: Take 1 capsule (300 mg total) by mouth 2 (two) times daily.    Dispense:  10 capsule    Refill:  0  . methocarbamol (ROBAXIN) 500 MG tablet    Sig: Take 1 tablet (500 mg total) by mouth every 8 (eight) hours as needed for muscle spasms.    Dispense:  90 tablet    Refill:  1    Follow-up: Return in about 3 months (around 04/28/2019) for Medical conditions.       [name], MD, FAAFP. [facility] and [place], [place]   01/26/2019, 11:41 AM

## 2019-02-01 MED FILL — GABAPENTIN 300 MG CAPSULE: 300 | 30 days supply | Qty: 60 | Fill #3

## 2019-02-02 ENCOUNTER — Encounter: Payer: Self-pay | Admitting: Neurology

## 2019-02-02 ENCOUNTER — Ambulatory Visit (INDEPENDENT_AMBULATORY_CARE_PROVIDER_SITE_OTHER): Payer: Medicaid Other | Admitting: Neurology

## 2019-02-02 ENCOUNTER — Other Ambulatory Visit: Payer: Self-pay

## 2019-02-02 ENCOUNTER — Telehealth: Payer: Self-pay

## 2019-02-02 DIAGNOSIS — S060X0A Concussion without loss of consciousness, initial encounter: Secondary | ICD-10-CM

## 2019-02-02 DIAGNOSIS — I6529 Occlusion and stenosis of unspecified carotid artery: Secondary | ICD-10-CM | POA: Diagnosis not present

## 2019-02-02 MED FILL — METHOCARBAMOL 500 MG TABS: 500 | 30 days supply | Qty: 90 | Fill #0

## 2019-02-02 NOTE — Progress Notes (Signed)
Virtual Visit via Video Note  I connected with [name] on 02/02/19 at 11:00 AM EDT by a video enabled telemedicine application and verified that I am speaking with the correct person using two identifiers.  This visit was performed using [email or web address] app for audio and visual  Location: Patient:at her home Provider: at [facility] office  Referring physician Dr.Newlin  I discussed the limitations of evaluation and management by telemedicine and the availability of in person appointments. The patient expressed understanding and agreed to proceed.  History of Present Illness: [name] is seen today for virtual video visit upon request by primary MD Dr Margarita Rana for concussion and headaches.  [name] is a 53 year old female with a history of atrial fibrillation/atrial flutter (status post cardioversion in 02/2016 currently on rate control with metoprolol and Cardizem and anticoagulation Xarelto s/p DCCV in 2017 ), cerebral infarction due to embolism of right middle cerebral artery status post IV TPA (in 10/2015),  right MCA stroke in 01/2018 (after running out of Xarelto.  She was last seen in office in July 2019. She states on 01/02/2019 had a minor concussion while she was cleaning a window and hit the back of her head and fell down.  Denied losing consciousness noticed a moderate headache which gradually improved.  She denied feeling sleepy.  She was concerned intracranial hemorrhage as she was taking Xarelto was seen in the emergency room.  Her vital signs were stable.  CT scan of the head was obtained which I personally reviewed showed a intracranial hemorrhage old strokes.  She was started on Fioricet for headaches and she has been taking 1 to 2 pills daily if she is not having headaches.  Feels overall her headaches are improving.  Denies any decrease in memory, concentration or attention problems she has not had any recurrent stroke or TIA symptoms.  Remains on Xarelto without bleeding or  bruising.  She is on Cardizem as well..  States blood pressure is well controlled she is tolerating Lipitor well without side effects. Observations/Objective: Physical and neurological exams are limited due to constraints of video visit.  Pleasant middle-age obese African-American lady who appears not in distress.  Awake alert oriented to time place and person.  No aphasia, apraxia or dysarthria.  As well.  Recall 3/3.  Able to name 10 animals which walk on 4 legs.  Extraocular movements are full range without nystagmus.  Face is symmetric without weakness.  Tongue is midline.  Motor system exam symmetric upper and lower extremity strength without focal weakness.  Gait is steady.  Assessment   53 year old lady with minor concussion postconcussion headaches 6 weeks ago.  No history of right MCA branch infarcts due to paroxysmal A. fib long-term Xarelto.  Vascular risk factors of atrial fibrillation/flutter, hypertension, hyperlipidemia and obesity  Plan: I advised the patient to using Fioricet 2 days/week to avoid analgesic rebound.  Use Tylenol or other over-the-counter to 6 if necessary for headaches which I expect to improve as may be becoming habituated with taking daily Fioricet.  She voiced understanding.  Continue Xarelto for stroke prevention for A. fib and strict control of hypertension with blood pressure goal below 130/90, lipids with LDL cholesterol goal below 70 mg percent.  I advised her to eat a healthy diet exercise and lose weight.   Follow Up Instructions: Check screening follow-up carotid ultrasound study. Limit using Fioricet to not more than 2 days/week to reduce Analgesic rebound headaches Return for follow-up in the  future in a year or call earlier if necessary  I discussed the assessment and treatment plan with the patient. The patient was provided an opportunity to ask questions and all were answered. The patient agreed with the plan and demonstrated an understanding of the  instructions.   The patient was advised to call back or seek an in-person evaluation if the symptoms worsen or if the condition fails to improve as anticipated.  I provided 25 minutes of non-face-to-face time during this encounter.   Antony Contras, MD

## 2019-02-02 NOTE — Telephone Encounter (Signed)
As per Maria Ramirez Perez, [employer] [place], the patient's referral is now closed.  

## 2019-02-03 ENCOUNTER — Telehealth: Payer: Self-pay | Admitting: Neurology

## 2019-02-03 NOTE — Telephone Encounter (Signed)
Heather from pre service's at [facility] called that the patient has her carotid schedule for 02/09/19 and she has medicaid. She stated it require's an authorization. Heather phone number is [phone] ext. [phone].

## 2019-02-03 NOTE — Telephone Encounter (Signed)
Called and left [name] a message relayed doppler was approved case # [id number]   02/03/2019 exp . 08/02/2019 .

## 2019-02-09 ENCOUNTER — Other Ambulatory Visit: Payer: Self-pay

## 2019-02-09 ENCOUNTER — Ambulatory Visit (HOSPITAL_COMMUNITY)
Admission: RE | Admit: 2019-02-09 | Discharge: 2019-02-09 | Disposition: A | Discharge status: Home / Self Care | Payer: Medicaid Other | Source: Ambulatory Visit | Attending: Neurology | Admitting: Neurology

## 2019-02-09 DIAGNOSIS — I6529 Occlusion and stenosis of unspecified carotid artery: Secondary | ICD-10-CM

## 2019-02-09 NOTE — Progress Notes (Signed)
Bilateral carotid duplex completed. Results in Chart review CV Proc. [place] Hillis Mcphatter,RVS 02/09/2019, 11:59 AM

## 2019-02-11 ENCOUNTER — Other Ambulatory Visit: Payer: Self-pay | Admitting: Family Medicine

## 2019-02-11 MED ORDER — MISC. DEVICES MISC: 0 refills | Status: DC

## 2019-02-14 DIAGNOSIS — R5383 Other fatigue: Secondary | ICD-10-CM | POA: Diagnosis not present

## 2019-02-14 DIAGNOSIS — G4733 Obstructive sleep apnea (adult) (pediatric): Secondary | ICD-10-CM | POA: Diagnosis not present

## 2019-02-14 DIAGNOSIS — R0683 Snoring: Secondary | ICD-10-CM | POA: Diagnosis not present

## 2019-02-15 DIAGNOSIS — I10 Essential (primary) hypertension: Secondary | ICD-10-CM | POA: Diagnosis not present

## 2019-02-22 ENCOUNTER — Ambulatory Visit: Payer: Self-pay | Admitting: Family Medicine

## 2019-02-23 ENCOUNTER — Encounter: Payer: Self-pay | Admitting: Family Medicine

## 2019-02-23 ENCOUNTER — Other Ambulatory Visit: Payer: Self-pay

## 2019-02-23 ENCOUNTER — Ambulatory Visit: Payer: Medicaid Other | Attending: Family Medicine | Admitting: Family Medicine

## 2019-02-23 DIAGNOSIS — R5383 Other fatigue: Secondary | ICD-10-CM | POA: Diagnosis not present

## 2019-02-23 DIAGNOSIS — G8929 Other chronic pain: Secondary | ICD-10-CM | POA: Diagnosis not present

## 2019-02-23 DIAGNOSIS — M545 Low back pain, unspecified: Secondary | ICD-10-CM

## 2019-02-23 DIAGNOSIS — M19042 Primary osteoarthritis, left hand: Secondary | ICD-10-CM | POA: Diagnosis not present

## 2019-02-23 DIAGNOSIS — R0683 Snoring: Secondary | ICD-10-CM | POA: Diagnosis not present

## 2019-02-23 DIAGNOSIS — M19041 Primary osteoarthritis, right hand: Secondary | ICD-10-CM

## 2019-02-23 DIAGNOSIS — G4733 Obstructive sleep apnea (adult) (pediatric): Secondary | ICD-10-CM | POA: Diagnosis not present

## 2019-02-23 DIAGNOSIS — G4489 Other headache syndrome: Secondary | ICD-10-CM

## 2019-02-23 MED ORDER — DICLOFENAC SODIUM 1 % TD GEL: 4.0000 g | Freq: Four times a day (QID) | TRANSDERMAL | 3 refills | Status: DC

## 2019-02-23 MED ORDER — PREDNISONE 20 MG PO TABS: 20.0000 mg | ORAL_TABLET | Freq: Two times a day (BID) | ORAL | 0 refills | Status: DC

## 2019-02-23 MED FILL — predniSONE 20 MG TABS: 20 | 5 days supply | Qty: 10 | Fill #0

## 2019-02-23 NOTE — Progress Notes (Signed)
Patient has been called and DOB has been verified. Patient has been screened and transferred to PCP to start phone visit.    Patient is having swelling in her hands.

## 2019-02-23 NOTE — Progress Notes (Signed)
Virtual Visit via Telephone Note  I connected with [name], on 02/23/2019 at 9:51 AM by telephone due to the COVID-19 pandemic and verified that I am speaking with the correct person using two identifiers.   Consent: I discussed the limitations, risks, security and privacy concerns of performing an evaluation and management service by telephone and the availability of in person appointments. I also discussed with the patient that there may be a patient responsible charge related to this service. The patient expressed understanding and agreed to proceed.   Location of Patient: Patient's home  Location of Provider: Clinic   Persons participating in Telemedicine visit: Arnette R Mykaila Blunck [place]-CMA Dr. Felecia Shelling     History of Present Illness: [name] is a 53 year old female with a history of atrial fibrillation/atrial flutter (status post cardioversion in 02/2016 currently on rate control with metoprolol and Cardizem and anticoagulation Xarelto s/p DCCV in 2017 ), cerebral infarction due to embolism of right middle cerebral artery status post IV TPA (in 10/2015),  right MCA stroke in 01/2018 (after running out of Xarelto) seen for an acute visit.  She complains of swelling of her hands which occur from the tip of her fingers to her knuckles and has been present for the last couple of months.  This is associated with tingling and she has to rub her fingers for relief.  Pain is described as a 7/10 and is present all the time.  She is able to make a fist and denies dropping things from her hand.  Denies similar symptoms in other joints of the body. She complains of back pain has started acting up and this is worse with prolonged standing. She denies heavy lifting and denies radiation of pain down her lower extremities, denies loss of sphincteric function or recent falls.  She does have chronic headaches for which she has been on Fioricet but at her most recent  visit with her neurologist was advised to cut back on Fioricet to twice weekly and augment with Tylenol.  Her headaches occur daily but are not associated with blurry vision, nausea or vomiting.    Past Medical History:  Diagnosis Date  . Clotting disorder ([place])    on xarelto  . Essential hypertension   . Heart murmur    a. 10/2015 Echo: EF 60-65%, no rwma, mild AI/MR, sev dil LA/RA, PASP 2mmHg.  [name] Noncompliance   . NSVT (nonsustained ventricular tachycardia) ([place])    a. 10/2015 during admission for CVA/AF.  [name] Paroxysmal A-fib ([place])    a. CHA2DS2VASC = 4-->Xarelto;  b. 02/2016 Successful DCCV.  [name] Pneumonia 2012 x 2; 2015  . Stroke [facility])    a. 02/3148 Embolic CVA of mid right middle cerebral atery - recieved TPA-->small amt of asymptomatic hemorrhagic transformation.  . Transient ischemic attack (TIA) 01/2013   Allergies  Allergen Reactions  . Penicillins Hives and Other (See Comments)    Unknown  Has patient had a PCN reaction causing immediate rash, facial/tongue/throat swelling, SOB or lightheadedness with hypotension: No Has patient had a PCN reaction causing severe rash involving mucus membranes or skin necrosis: No Has patient had a PCN reaction that required hospitalization No Has patient had a PCN reaction occurring within the last 10 years: No If all of the above answers are "NO", then may proceed with Cephalosporin use.    Current Outpatient Medications on File Prior to Visit  Medication Sig Dispense Refill  . acetaminophen (TYLENOL) 500 MG tablet Take  500 mg by mouth every 6 (six) hours as needed for headache.    . albuterol (PROVENTIL HFA;VENTOLIN HFA) 108 (90 Base) MCG/ACT inhaler Inhale 2 puffs into the lungs every 6 (six) hours as needed for wheezing or shortness of breath. 1 Inhaler 2  . atorvastatin (LIPITOR) 80 MG tablet Take 1 tablet (80 mg total) by mouth daily at 6 PM. 30 tablet 6  . butalbital-acetaminophen-caffeine (FIORICET) 50-325-40 MG tablet Take on tablet  every 12 hours as needed 40 tablet 1  . cetirizine (ZYRTEC) 10 MG tablet Take 1 tablet (10 mg total) by mouth daily. 30 tablet 6  . diclofenac sodium (VOLTAREN) 1 % GEL Apply 4 g topically 4 (four) times daily. 100 g 3  . ergocalciferol (DRISDOL) 1.25 MG (50000 UT) capsule Take 1 capsule (50,000 Units total) by mouth once a week. 9 capsule 0  . fluticasone (FLONASE) 50 MCG/ACT nasal spray Place 2 sprays into both nostrils daily. 16 g 6  . gabapentin (NEURONTIN) 300 MG capsule Take 1 capsule (300 mg total) by mouth 2 (two) times daily. 60 capsule 3  . methocarbamol (ROBAXIN) 500 MG tablet Take 1 tablet (500 mg total) by mouth every 8 (eight) hours as needed for muscle spasms. 90 tablet 1  . Misc. Devices MISC Blood pressure monitor.  Diagnosis Hypertension 1 each 0  . rivaroxaban (XARELTO) 20 MG TABS tablet Take 1 tablet (20 mg total) by mouth daily with supper. 30 tablet 6  . benzonatate (TESSALON) 100 MG capsule Take 1 capsule (100 mg total) by mouth 2 (two) times daily as needed for cough. (Patient not taking: Reported on 01/26/2019) 20 capsule 0  . clindamycin (CLEOCIN) 300 MG capsule Take 1 capsule (300 mg total) by mouth 2 (two) times daily. (Patient not taking: Reported on 02/23/2019) 10 capsule 0  . diltiazem (CARDIZEM CD) 300 MG 24 hr capsule Take 1 capsule (300 mg total) by mouth daily for 30 days. 30 capsule 6  . metoprolol tartrate (LOPRESSOR) 25 MG tablet Take 0.5 tablets (12.5 mg total) by mouth 2 (two) times daily for 30 days. 30 tablet 6   Current Facility-Administered Medications on File Prior to Visit  Medication Dose Route Frequency Provider Last Rate Last Dose  . 0.9 %  sodium chloride infusion  500 mL Intravenous Once Armbruster, Carlota Raspberry, MD        Observations/Objective: Awake, alert, oriented x3 Not in acute distress  Assessment and Plan: 1. Chronic midline low back pain without sciatica Worse with prolonged standing Advised to apply heat Unable to use NSAIDs due to  risk of bleeding when compliant with chronic anticoagulation Counseled on exercising regularly, yoga, referred for PT - diclofenac sodium (VOLTAREN) 1 % GEL; Apply 4 g topically 4 (four) times daily.  Dispense: 100 g; Refill: 3 - Ambulatory referral to Physical Therapy  2. Other headache syndrome Currently using Fioricet about twice a week and augmenting with Tylenol Counseled against excessive use of Tylenol due to prevent hepatotoxicity If headaches persist she will need to switch to Topamax  3. Osteoarthritis of both hands, unspecified osteoarthritis type - predniSONE (DELTASONE) 20 MG tablet; Take 1 tablet (20 mg total) by mouth 2 (two) times daily with a meal.  Dispense: 10 tablet; Refill: 0 - Ambulatory referral to Physical Therapy   Follow Up Instructions: Keep previously scheduled appointment   I discussed the assessment and treatment plan with the patient. The patient was provided an opportunity to ask questions and all were answered. The patient agreed  with the plan and demonstrated an understanding of the instructions.   The patient was advised to call back or seek an in-person evaluation if the symptoms worsen or if the condition fails to improve as anticipated.     I provided 16 minutes total of non-face-to-face time during this encounter including median intraservice time, reviewing previous notes, labs, imaging, medications, management and patient verbalized understanding.     [name], MD, FAAFP. [facility] and [place], [place]   02/23/2019, 9:51 AM `

## 2019-02-24 MED FILL — XARELTO 20 MG TABLET: 20 | 30 days supply | Qty: 30 | Fill #5

## 2019-03-08 ENCOUNTER — Other Ambulatory Visit: Payer: Self-pay | Admitting: Family Medicine

## 2019-03-08 DIAGNOSIS — R29898 Other symptoms and signs involving the musculoskeletal system: Secondary | ICD-10-CM

## 2019-03-08 MED FILL — BUTALB-ACETAMIN-CAFF 50-325: 50-325-40 | 20 days supply | Qty: 40 | Fill #1

## 2019-03-08 MED FILL — ATORVASTATIN 80 MG TABLET: 80 | 90 days supply | Qty: 90 | Fill #3

## 2019-03-08 MED FILL — CETIRIZINE HCL 10 MG TABS: 10 | 90 days supply | Qty: 90 | Fill #1

## 2019-03-09 MED FILL — GABAPENTIN 300 MG CAPSULE: 300 | 30 days supply | Qty: 60 | Fill #0

## 2019-03-10 ENCOUNTER — Telehealth: Payer: Self-pay | Admitting: Neurology

## 2019-03-10 NOTE — Telephone Encounter (Signed)
Called the patient and made her aware that Dr Leonie Man reviewed the carotid US and found no blockages or narrowing of the carotid arteries bilaterally. Pt verbalized understanding. Pt had no questions at this time but was encouraged to call back if questions arise.

## 2019-03-10 NOTE — Telephone Encounter (Signed)
-----   Message from Garvin Fila, MD sent at 02/18/2019 11:38 AM EDT ----- Mitchell Heir inform the patient that carotid ultrasound study showed no significant narrowing of either carotid artery in the neck.

## 2019-03-16 DIAGNOSIS — G4733 Obstructive sleep apnea (adult) (pediatric): Secondary | ICD-10-CM | POA: Diagnosis not present

## 2019-03-16 DIAGNOSIS — R0683 Snoring: Secondary | ICD-10-CM | POA: Diagnosis not present

## 2019-03-16 DIAGNOSIS — R5383 Other fatigue: Secondary | ICD-10-CM | POA: Diagnosis not present

## 2019-03-24 ENCOUNTER — Other Ambulatory Visit: Payer: Self-pay | Admitting: Family Medicine

## 2019-03-24 MED FILL — FLUTICASONE PROP 50 MCG SPR: 50 | 30 days supply | Qty: 16 | Fill #1

## 2019-03-24 MED FILL — ALBUTEROL SULFATE HFA 108 (: 108 (90 BAS | 25 days supply | Qty: 18 | Fill #1

## 2019-03-24 MED FILL — VIT D2 1.25 MG (50,000 UNIT: 1.25 MG | 28 days supply | Qty: 4 | Fill #0

## 2019-03-24 MED FILL — XARELTO 20 MG TABLET: 20 | 30 days supply | Qty: 30 | Fill #6

## 2019-03-31 ENCOUNTER — Telehealth: Payer: Medicaid Other | Admitting: Cardiovascular Disease

## 2019-04-01 ENCOUNTER — Telehealth: Payer: Self-pay | Admitting: Cardiovascular Disease

## 2019-04-01 NOTE — Telephone Encounter (Signed)
LVM, reminding pt of her appt and to call to give consent for her appt on 04-04-19 with Dr C.

## 2019-04-04 ENCOUNTER — Other Ambulatory Visit: Payer: Self-pay

## 2019-04-04 ENCOUNTER — Encounter: Payer: Self-pay | Admitting: Cardiovascular Disease

## 2019-04-04 ENCOUNTER — Ambulatory Visit (INDEPENDENT_AMBULATORY_CARE_PROVIDER_SITE_OTHER): Payer: Medicaid Other | Admitting: Cardiovascular Disease

## 2019-04-04 VITALS — BP 128/87 | HR 70 | Ht 64.0 in | Wt 222.6 lb

## 2019-04-04 DIAGNOSIS — I10 Essential (primary) hypertension: Secondary | ICD-10-CM | POA: Diagnosis not present

## 2019-04-04 DIAGNOSIS — E785 Hyperlipidemia, unspecified: Secondary | ICD-10-CM

## 2019-04-04 DIAGNOSIS — I482 Chronic atrial fibrillation, unspecified: Secondary | ICD-10-CM

## 2019-04-04 DIAGNOSIS — Z7901 Long term (current) use of anticoagulants: Secondary | ICD-10-CM | POA: Diagnosis not present

## 2019-04-04 DIAGNOSIS — R7303 Prediabetes: Secondary | ICD-10-CM

## 2019-04-04 NOTE — Patient Instructions (Addendum)

## 2019-04-04 NOTE — Progress Notes (Signed)
Cardiology Office Note:    Date:  04/04/2019   ID:  [name], DOB 02/09/1966, MRN [id number]  PCP:  [name], MD  Cardiologist:  No primary care provider on file.  Electrophysiologist:  None   Referring MD: [name], MD   Chief Complaint  Patient presents with  . Atrial Fibrillation    History of Present Illness:    [name] is a 53 y.o. female with a hx of longstanding persistent atrial fibrillation, 2 previous episodes of stroke without residual deficits and essential hypertension.  She had early recurrence of atrial fibrillation after previous cardioversion and has biatrial enlargement, making it likely that she will have permanent arrhythmia.  She does not have any serious valvular problems and has normal left ventricular systolic function.  She does not have symptoms of obstructive sleep apnea, but is moderately obese.  She has been able to receive her medications including the anticoagulant without interruptions.  She now has Medicaid making her medications affordable.  She has not had any neurological events since her admission for stroke in May 2019 when her Xarelto was interrupted.  She was evaluated in the emergency room in April after an accidental head injury, no problems identified on head CT.  She is only occasionally aware of palpitations if she lies in a certain position.  The patient specifically denies any chest pain at rest exertion, dyspnea at rest or with exertion, orthopnea, paroxysmal nocturnal dyspnea, syncope, rapid palpitations, focal neurological deficits, intermittent claudication, lower extremity edema, unexplained weight gain, cough, hemoptysis or wheezing.  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  She currently has 2 clients.  She lives with her daughter and granddaughter (62-year-old who will be homeschooled this year).  Past Medical History:  Diagnosis Date  . Clotting disorder ([place])    on xarelto   . Essential hypertension   . Heart murmur    a. 10/2015 Echo: EF 60-65%, no rwma, mild AI/MR, sev dil LA/RA, PASP 37mmHg.  [name] Noncompliance   . NSVT (nonsustained ventricular tachycardia) ([place])    a. 10/2015 during admission for CVA/AF.  [name] Paroxysmal A-fib ([place])    a. CHA2DS2VASC = 4-->Xarelto;  b. 02/2016 Successful DCCV.  [name] Pneumonia 2012 x 2; 2015  . Stroke [facility])    a. 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  . Transient ischemic attack (TIA) 01/2013    Past Surgical History:  Procedure Laterality Date  . CARDIOVERSION N/A 02/18/2013   Procedure: CARDIOVERSION;  Surgeon: Sanda Klein, MD;  Location: [place] ENDOSCOPY;  Service: Cardiovascular;  Laterality: N/A;  . CARDIOVERSION N/A 02/23/2013   Procedure: CARDIOVERSION;  Surgeon: Leonie Man, MD;  Location: [place];  Service: Cardiovascular;  Laterality: N/A;  BESIDE CV  . CARDIOVERSION N/A 03/12/2016   Procedure: CARDIOVERSION;  Surgeon: Larey Dresser, MD;  Location: [place];  Service: Cardiovascular;  Laterality: N/A;  . TEE WITHOUT CARDIOVERSION N/A 02/18/2013   Procedure: TRANSESOPHAGEAL ECHOCARDIOGRAM (TEE);  Surgeon: Sanda Klein, MD;  Location: [facility] ENDOSCOPY;  Service: Cardiovascular;  Laterality: N/A;  . TUBAL LIGATION  1992    Current Medications: No outpatient medications have been marked as taking for the 04/04/19 encounter (Office Visit) with Sanda Klein, MD.   Current Facility-Administered Medications for the 04/04/19 encounter (Office Visit) with Sanda Klein, MD  Medication  . 0.9 %  sodium chloride infusion     Allergies:   Penicillins  Social History   Socioeconomic History  . Marital status: Single    Spouse name: Not on file  . Number of children: Not on file  . Years of education: Not on file  . Highest education level: Not on file  Occupational History  . Occupation: Airline pilot: [employer]   . Occupation: in home health aide  Social Needs  . Financial resource strain: Not on file  . Food insecurity    Worry: Not on file    Inability: Not on file  . Transportation needs    Medical: Not on file    Non-medical: Not on file  Tobacco Use  . Smoking status: Former Smoker    Years: 3.00    Types: Cigarettes    Quit date: 03/15/2013    Years since quitting: 6.0  . Smokeless tobacco: Never Used  Substance and Sexual Activity  . Alcohol use: Yes    Comment: drinks on the weekends, 2 drinks and 2 beers  . Drug use: No  . Sexual activity: Not Currently    Birth control/protection: None  Lifestyle  . Physical activity    Days per week: Not on file    Minutes per session: Not on file  . Stress: Not on file  Relationships  . Social Herbalist on phone: Not on file    Gets together: Not on file    Attends religious service: Not on file    Active member of club or organization: Not on file    Attends meetings of clubs or organizations: Not on file    Relationship status: Not on file  Other Topics Concern  . Not on file  Social History Narrative  . Not on file     Family History: The patient's family history includes Atrial fibrillation in her father and mother; Breast cancer in her mother; Cancer in her mother; Diabetes in an other family member; Hypertension in her father and another family member; Peripheral vascular disease in her father. There is no history of Heart attack, Stroke, Rectal cancer, Colon cancer, Esophageal cancer, or Stomach cancer.  ROS:   Please see the history of present illness.     All other systems reviewed and are negative.  EKGs/Labs/Other Studies Reviewed:    The following studies were reviewed today:  EKG:  EKG is  ordered today.  The ekg ordered today demonstrates atrial fibrillation controlled ventricular response, left axis deviation.  Inferolateral T wave inversion, borderline QTC 466 ms.  Varying degrees of T wave inversion  have been seen on previous ECGs, interestingly they are always more obvious when her heart rate is slower.  Recent Labs: 06/11/2018: B Natriuretic Peptide 287.9 06/12/2018: Hemoglobin 11.1; Platelets 169 09/29/2018: ALT 18; BUN 14; Creatinine, Ser 0.85; Potassium 4.0; Sodium 143  Recent Lipid Panel    Component Value Date/Time   CHOL 118 09/29/2018 0959   TRIG 67 09/29/2018 0959   HDL 48 09/29/2018 0959   CHOLHDL 2.5 09/29/2018 0959   CHOLHDL 3.1 02/09/2018 0531   VLDL 17 02/09/2018 0531   LDLCALC 57 09/29/2018 0959    Physical Exam:    VS:  BP 128/87   Pulse 70   Ht 5\' 4"  (1.626 m)   Wt 222 lb 9.6 oz (101 kg)   SpO2 97%   BMI 38.21 kg/m     Wt Readings from Last 3 Encounters:  04/04/19 222 lb 9.6 oz (101 kg)  01/26/19 220 lb (99.8 kg)  12/23/18 210 lb (95.3 kg)     GEN: Severely obese, well nourished, well developed in no acute distress HEENT: Normal NECK: No JVD; No carotid bruits LYMPHATICS: No lymphadenopathy CARDIAC: Irregular  no murmurs, rubs, gallops RESPIRATORY:  Clear to auscultation without rales, wheezing or rhonchi  ABDOMEN: Soft, non-tender, non-distended MUSCULOSKELETAL:  No edema; No deformity  SKIN: Warm and dry NEUROLOGIC:  Alert and oriented x 3 PSYCHIATRIC:  Normal affect   ASSESSMENT:    1. Chronic atrial fibrillation   2. Chronic anticoagulation   3. Severe obesity (BMI 35.0-39.9) with comorbidity ([place])   4. Essential hypertension   5. Hyperlipidemia LDL goal <70   6. Prediabetes    PLAN:    In order of problems listed above:  1. AFib: Well rate controlled.  Appropriately anticoagulated.  Minimally symptomatic.  No plan for antiarrhythmic therapy, suspect she will have permanent atrial fibrillation. CHADSVasc 4 (gender, hypertension, CVA). 2. Anticoagulation: Well-tolerated without serious bleeding problems.  She is planning wisdom tooth extraction.  Discussed the need to limit anticoagulant interruption for minimum, due to her history of  strokes.  Ideally, will only skip 1 dose of Xarelto, the evening before the procedure.  Would not stop the medication for more than 48 hours.  3. Obesity: Denies daytime hypersomnolence and snoring.  Would still recommend weight loss.  4. HTN: Well-controlled. 5. HLP: All lipid parameters are excellent on the current dose of atorvastatin.  6. PreDM: Most recent hemoglobin A1c from a year ago was 6.1%.  Weight loss would be beneficial to prevent full-blown diabetes.   Medication Adjustments/Labs and Tests Ordered: Current medicines are reviewed at length with the patient today.  Concerns regarding medicines are outlined above.  Orders Placed This Encounter  Procedures  . EKG 12-Lead   No orders of the defined types were placed in this encounter.   Patient Instructions  Medication Instructions:  Your physician recommends that you continue on your current medications as directed. Please refer to the Current Medication list given to you today.  If you need a refill on your cardiac medications before your next appointment, please call your pharmacy.   Lab work: None ordered If you have labs (blood work) drawn today and your tests are completely normal, you will receive your results only by: [place] (if you have MyChart) OR A paper copy in the mail If you have any lab test that is abnormal or we need to change your treatment, we will call you to review the results.  Testing/Procedures: None ordered  Follow-Up: At [facility], you and your health needs are our priority.  As part of our continuing mission to provide you with exceptional heart care, we have created designated Provider Care Teams.  These Care Teams include your primary Cardiologist (physician) and Advanced Practice Providers (APPs -  Physician Assistants and Nurse Practitioners) who all work together to provide you with the care you need, when you need it. You will need a follow up appointment in 12 months.  Please  call our office 2 months in advance to schedule this appointment.  You may see Sanda Klein, MD or one of the following Advanced Practice Providers on your designated Care Team: Almyra Deforest, PA-C Fabian Sharp, PA-C          Signed, Sanda Klein, MD  04/04/2019 9:36 AM    [place]

## 2019-04-16 DIAGNOSIS — G4733 Obstructive sleep apnea (adult) (pediatric): Secondary | ICD-10-CM | POA: Diagnosis not present

## 2019-04-16 DIAGNOSIS — R5383 Other fatigue: Secondary | ICD-10-CM | POA: Diagnosis not present

## 2019-04-16 DIAGNOSIS — R0683 Snoring: Secondary | ICD-10-CM | POA: Diagnosis not present

## 2019-04-20 ENCOUNTER — Other Ambulatory Visit: Payer: Self-pay | Admitting: Family Medicine

## 2019-04-20 DIAGNOSIS — Z1231 Encounter for screening mammogram for malignant neoplasm of breast: Secondary | ICD-10-CM

## 2019-04-21 ENCOUNTER — Encounter (HOSPITAL_COMMUNITY): Payer: Self-pay | Admitting: *Deleted

## 2019-04-21 ENCOUNTER — Emergency Department (HOSPITAL_COMMUNITY)
Admission: EM | Admit: 2019-04-21 | Discharge: 2019-04-22 | Disposition: A | Discharge status: Home / Self Care | Payer: Medicaid Other | Attending: Emergency Medicine | Admitting: Emergency Medicine

## 2019-04-21 ENCOUNTER — Emergency Department (HOSPITAL_COMMUNITY): Payer: Medicaid Other

## 2019-04-21 ENCOUNTER — Other Ambulatory Visit: Payer: Self-pay

## 2019-04-21 DIAGNOSIS — I10 Essential (primary) hypertension: Secondary | ICD-10-CM | POA: Diagnosis not present

## 2019-04-21 DIAGNOSIS — R609 Edema, unspecified: Secondary | ICD-10-CM | POA: Diagnosis not present

## 2019-04-21 DIAGNOSIS — Z7901 Long term (current) use of anticoagulants: Secondary | ICD-10-CM | POA: Insufficient documentation

## 2019-04-21 DIAGNOSIS — Z87891 Personal history of nicotine dependence: Secondary | ICD-10-CM | POA: Diagnosis not present

## 2019-04-21 DIAGNOSIS — M79606 Pain in leg, unspecified: Secondary | ICD-10-CM | POA: Diagnosis present

## 2019-04-21 DIAGNOSIS — I517 Cardiomegaly: Secondary | ICD-10-CM

## 2019-04-21 DIAGNOSIS — I4891 Unspecified atrial fibrillation: Secondary | ICD-10-CM | POA: Diagnosis not present

## 2019-04-21 DIAGNOSIS — R600 Localized edema: Secondary | ICD-10-CM | POA: Diagnosis not present

## 2019-04-21 DIAGNOSIS — R0602 Shortness of breath: Secondary | ICD-10-CM | POA: Insufficient documentation

## 2019-04-21 DIAGNOSIS — Z79899 Other long term (current) drug therapy: Secondary | ICD-10-CM | POA: Insufficient documentation

## 2019-04-21 LAB — COMPREHENSIVE METABOLIC PANEL
ALT: 20 U/L (ref 0–44)
AST: 24 U/L (ref 15–41)
Albumin: 3.5 g/dL (ref 3.5–5.0)
Alkaline Phosphatase: 79 U/L (ref 38–126)
Anion gap: 11 (ref 5–15)
BUN: 10 mg/dL (ref 6–20)
CO2: 21 mmol/L — ABNORMAL LOW (ref 22–32)
Calcium: 9.3 mg/dL (ref 8.9–10.3)
Chloride: 106 mmol/L (ref 98–111)
Creatinine, Ser: 0.87 mg/dL (ref 0.44–1.00)
GFR calc Af Amer: >60 mL/min (ref >60)
GFR calc non Af Amer: >60 mL/min (ref >60)
Glucose, Bld: 98 mg/dL (ref 70–99)
Potassium: 4.1 mmol/L (ref 3.5–5.1)
Sodium: 138 mmol/L (ref 135–145)
Total Bilirubin: 0.5 mg/dL (ref 0.3–1.2)
Total Protein: 7.5 g/dL (ref 6.5–8.1)

## 2019-04-21 LAB — CBC WITH DIFFERENTIAL/PLATELET
Abs Immature Granulocytes: 0.02 10*3/uL (ref 0.00–0.07)
Basophils Absolute: 0.1 10*3/uL (ref 0.0–0.1)
Basophils Relative: 1 %
Eosinophils Absolute: 0.2 10*3/uL (ref 0.0–0.5)
Eosinophils Relative: 2 %
HCT: 42.7 % (ref 36.0–46.0)
Hemoglobin: 13.1 g/dL (ref 12.0–15.0)
Immature Granulocytes: 0 %
Lymphocytes Relative: 26 %
Lymphs Abs: 2.3 10*3/uL (ref 0.7–4.0)
MCH: 26.7 pg (ref 26.0–34.0)
MCHC: 30.7 g/dL (ref 30.0–36.0)
MCV: 87 fL (ref 80.0–100.0)
Monocytes Absolute: 0.8 10*3/uL (ref 0.1–1.0)
Monocytes Relative: 9 %
Neutro Abs: 5.5 10*3/uL (ref 1.7–7.7)
Neutrophils Relative %: 62 %
Platelets: 205 10*3/uL (ref 150–400)
RBC: 4.91 MIL/uL (ref 3.87–5.11)
RDW: 15.2 % (ref 11.5–15.5)
WBC: 8.9 10*3/uL (ref 4.0–10.5)
nRBC: 0 % (ref 0.0–0.2)

## 2019-04-21 LAB — BRAIN NATRIURETIC PEPTIDE: B Natriuretic Peptide: 125.6 pg/mL — ABNORMAL HIGH (ref 0.0–100.0)

## 2019-04-21 MED ORDER — FUROSEMIDE 10 MG/ML IJ SOLN
20.0000 mg | Freq: Once | INTRAMUSCULAR | Status: AC
  Administered 2019-04-21: 20 mg via INTRAVENOUS
  Filled 2019-04-21: qty 2

## 2019-04-21 MED ORDER — ACETAMINOPHEN 325 MG PO TABS
650.0000 mg | ORAL_TABLET | Freq: Once | ORAL | Status: AC
  Administered 2019-04-21: 650 mg via ORAL
  Filled 2019-04-21: qty 2

## 2019-04-21 MED ORDER — FUROSEMIDE 20 MG PO TABS: 20.0000 mg | ORAL_TABLET | Freq: Every day | ORAL | 0 refills | Status: DC

## 2019-04-21 NOTE — ED Notes (Addendum)
Pt ambulatory from waiting room to room, states she feels exhausted from walking that distance, mild exertional SOB noted.

## 2019-04-21 NOTE — Discharge Instructions (Signed)
Take medications as prescribed. Start Lasix tomorrow to try and help with edema. It is very important to follow-up with a cardiologist.  I recommend that you follow-up next week. Return to emergency room with any new, worsening, concerning symptoms.

## 2019-04-21 NOTE — ED Triage Notes (Signed)
Pt reports bilateral leg and arm pain and swelling . Pt states that she is unsure of any injury. Pt states that it started dull and is now sharp. Reports hx of stroke. No neuro deficits at triage.

## 2019-04-21 NOTE — ED Provider Notes (Signed)
[place] EMERGENCY DEPARTMENT Provider Note   CSN: [id number] Arrival date & time: 04/21/19  5681    History   Chief Complaint Chief Complaint  Patient presents with  . Leg Pain    HPI [name] is a 53 y.o. female presenting for evaluation of bilateral leg and arm pain.  Patient states that the past 2 days she has had bilateral leg and arm pain and swelling.  This worsened last night.  She describes it as a constant throbbing pain with intermittent sharp pains.  She reports worsening shortness of breath and cough in the past 2 days.  Shortness of breath is worse when she lays on her left side and with exertion.  She denies fevers, chills, nasal congestion, sore throat, chest pain, nausea, vomiting, dental pain, urinary symptoms, abnormal bowel movements.  Patient states she has a history of A. fib for which she is on anticoagulation.  She has been taking this as prescribed.  She follows with cardiology.  Additional history of hypertension and previous CVA.     HPI  Past Medical History:  Diagnosis Date  . Clotting disorder ([place])    on xarelto  . Essential hypertension   . Heart murmur    a. 10/2015 Echo: EF 60-65%, no rwma, mild AI/MR, sev dil LA/RA, PASP 36mmHg.  [name] Noncompliance   . NSVT (nonsustained ventricular tachycardia) ([place])    a. 10/2015 during admission for CVA/AF.  [name] Paroxysmal A-fib ([place])    a. CHA2DS2VASC = 4-->Xarelto;  b. 02/2016 Successful DCCV.  [name] Pneumonia 2012 x 2; 2015  . Stroke [facility])    a. 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  . Transient ischemic attack (TIA) 01/2013    Patient Active Problem List   Diagnosis Date Noted  . Severe obesity (BMI 35.0-39.9) with comorbidity ([place]) 04/04/2019  . Prediabetes 04/04/2019  . Seasonal allergic rhinitis due to pollen 12/14/2018  . Cough 12/14/2018  . Chronic back pain 08/23/2018  . Chronic diastolic  CHF (congestive heart failure) ([place]) 06/23/2018  . OSA on CPAP 06/23/2018  . Acute ischemic right MCA stroke ([place]) 02/08/2018  . Headache 01/29/2017  . Refractive errors 04/15/2016  . Medical non-compliance 11/06/2015  . Hyperlipidemia LDL goal <70 11/06/2015  . 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 NSVT (nonsustained ventricular tachycardia) ([place]) 11/06/2015  . History of CVA (cerebrovascular accident)   . Claudication of both lower extremities ([place]) 03/22/2015  . Chronic atrial fibrillation 04/12/2014  . Pap smear for cervical cancer screening 03/28/2014  . Cigarette smoker 06/15/2013  . Ventral hernia 06/15/2013  . Chronic anticoagulation 02/28/2013  . Hx-TIA (transient ischemic attack) 02/05/13 02/15/2013  . Essential hypertension 02/15/2013    Past Surgical History:  Procedure Laterality Date  . CARDIOVERSION N/A 02/18/2013   Procedure: CARDIOVERSION;  Surgeon: Sanda Klein, MD;  Location: [place] ENDOSCOPY;  Service: Cardiovascular;  Laterality: N/A;  . CARDIOVERSION N/A 02/23/2013   Procedure: CARDIOVERSION;  Surgeon: Leonie Man, MD;  Location: [place];  Service: Cardiovascular;  Laterality: N/A;  BESIDE CV  . CARDIOVERSION N/A 03/12/2016   Procedure: CARDIOVERSION;  Surgeon: Larey Dresser, MD;  Location: [place];  Service: Cardiovascular;  Laterality: N/A;  . TEE WITHOUT CARDIOVERSION N/A 02/18/2013   Procedure: TRANSESOPHAGEAL ECHOCARDIOGRAM (TEE);  Surgeon: Sanda Klein, MD;  Location[facility] ENDOSCOPY;  Service: Cardiovascular;  Laterality: N/A;  . TUBAL LIGATION  1992     OB History   No  obstetric history on file.      Home Medications    Prior to Admission medications   Medication Sig Start Date End Date Taking? Authorizing Provider  acetaminophen (TYLENOL) 500 MG tablet Take 1,000 mg by mouth every 6 (six) hours as needed for headache.    Yes [provider]  albuterol (PROVENTIL HFA;VENTOLIN HFA) 108 (90 Base) MCG/ACT inhaler Inhale 2 puffs into the  lungs every 6 (six) hours as needed for wheezing or shortness of breath. 11/17/18  Yes [name], MD  atorvastatin (LIPITOR) 80 MG tablet Take 1 tablet (80 mg total) by mouth daily at 6 PM. 09/29/18   [name], MD  butalbital-acetaminophen-caffeine (FIORICET) 50-325-40 MG tablet Take on tablet every 12 hours as needed Patient taking differently: Take 1 tablet by mouth every 12 (twelve) hours as needed.  01/12/19   [name], MD  cetirizine (ZYRTEC) 10 MG tablet Take 1 tablet (10 mg total) by mouth daily. 12/14/18   [name] Stain, MD  diclofenac sodium (VOLTAREN) 1 % GEL Apply 4 g topically 4 (four) times daily. 02/23/19   [name], MD  diltiazem (CARDIZEM CD) 300 MG 24 hr capsule Take 1 capsule (300 mg total) by mouth daily for 30 days. 09/29/18 10/29/18  [name], MD  fluticasone (FLONASE) 50 MCG/ACT nasal spray Place 2 sprays into both nostrils daily. 12/14/18   [name] Stain, MD  furosemide (LASIX) 20 MG tablet Take 1 tablet (20 mg total) by mouth daily. 04/22/19   Leighann Amadon, PA-C  gabapentin (NEURONTIN) 300 MG capsule TAKE 1 CAPSULE BY MOUTH 2 TIMES DAILY. 03/09/19   [name], MD  methocarbamol (ROBAXIN) 500 MG tablet Take 1 tablet (500 mg total) by mouth every 8 (eight) hours as needed for muscle spasms. 01/26/19   [name], MD  metoprolol tartrate (LOPRESSOR) 25 MG tablet Take 0.5 tablets (12.5 mg total) by mouth 2 (two) times daily for 30 days. 09/29/18 10/29/18  [name], MD  Misc. Devices MISC Blood pressure monitor.  Diagnosis Hypertension 02/11/19   [name], MD  rivaroxaban (XARELTO) 20 MG TABS tablet Take 1 tablet (20 mg total) by mouth daily with supper. 09/29/18   [name], MD  Vitamin D, Ergocalciferol, (DRISDOL) 1.25 MG (50000 UT) CAPS capsule TAKE 1 CAPSULE (50,000 UNITS TOTAL) BY MOUTH ONCE A WEEK. 03/24/19   [name], MD    Family History Family History  Problem Relation Age of Onset  . Cancer Mother   .  Atrial fibrillation Mother   . Breast cancer Mother   . Hypertension Father   . Atrial fibrillation Father   . Peripheral vascular disease Father   . Hypertension Other   . Diabetes Other   . Heart attack Neg Hx   . Stroke Neg Hx   . Rectal cancer Neg Hx   . Colon cancer Neg Hx   . Esophageal cancer Neg Hx   . Stomach cancer Neg Hx     Social History Social History   Tobacco Use  . Smoking status: Former Smoker    Years: 3.00    Types: Cigarettes    Quit date: 03/15/2013    Years since quitting: 6.1  . Smokeless tobacco: Never Used  Substance Use Topics  . Alcohol use: Yes    Comment: drinks on the weekends, 2 drinks and 2 beers  . Drug use: No     Allergies   Penicillins   Review of Systems Review of Systems  Musculoskeletal: Positive for myalgias.  Neurological: Negative for numbness.  Hematological: Bruises/bleeds easily.  All other systems reviewed and are negative.    Physical Exam Updated Vital Signs BP 124/75   Pulse 71   Temp 99 F (37.2 C) (Oral)   Resp (!) 23   SpO2 100%   Physical Exam Vitals signs and nursing note reviewed.  Constitutional:      General: She is not in acute distress.    Appearance: She is well-developed.  HENT:     Head: Normocephalic and atraumatic.  Eyes:     Extraocular Movements: Extraocular movements intact.     Conjunctiva/sclera: Conjunctivae normal.     Pupils: Pupils are equal, round, and reactive to light.  Neck:     Musculoskeletal: Normal range of motion and neck supple.  Cardiovascular:     Rate and Rhythm: Normal rate and regular rhythm.     Pulses: Normal pulses.  Pulmonary:     Effort: Pulmonary effort is normal. No respiratory distress.     Breath sounds: Normal breath sounds. No wheezing.     Comments: Speaking in full sentences.  Clear lung sounds in all fields.  No signs of respiratory distress.  Sats stable. Abdominal:     General: There is no distension.     Palpations: Abdomen is soft. There  is no mass.     Tenderness: There is no abdominal tenderness. There is no guarding or rebound.  Musculoskeletal: Normal range of motion.     Right lower leg: Edema present.     Left lower leg: Edema present.     Comments: 1-2+ pitting edema bilaterally extending up the shins.  No pitting edema of the upper extremities.  Radial pedal pulses intact bilaterally.   Skin:    General: Skin is warm and dry.     Capillary Refill: Capillary refill takes less than 2 seconds.  Neurological:     Mental Status: She is alert and oriented to person, place, and time.      ED Treatments / Results  Labs (all labs ordered are listed, but only abnormal results are displayed) Labs Reviewed  COMPREHENSIVE METABOLIC PANEL - Abnormal; Notable for the following components:      Result Value   CO2 21 (*)    All other components within normal limits  BRAIN NATRIURETIC PEPTIDE - Abnormal; Notable for the following components:   B Natriuretic Peptide 125.6 (*)    All other components within normal limits  CBC WITH DIFFERENTIAL/PLATELET    EKG EKG Interpretation  Date/Time:  Thursday April 21 2019 11:14:59 EDT Ventricular Rate:  95 PR Interval:    QRS Duration: 86 QT Interval:  411 QTC Calculation: 512 R Axis:   -25 Text Interpretation:  Atrial-paced complexes Borderline left axis deviation Anteroseptal infarct, old Abnormal T, consider ischemia, diffuse leads Prolonged QT interval When compared to prior, similar afib/flutter.  NO STEMI Confirmed by Antony Blackbird [phone]) on 04/21/2019 11:34:10 AM   Radiology Dg Chest 2 View  Result Date: 04/21/2019 CLINICAL DATA:  Hypertension, shortness of breath, atrial fibrillation, smoker EXAM: CHEST - 2 VIEW COMPARISON:  06/12/2018 FINDINGS: Enlargement of cardiac silhouette. Mediastinal contours and pulmonary vascularity normal. Lungs clear. No infiltrate, pleural effusion, or pneumothorax. Bones demineralized. IMPRESSION: Enlargement of cardiac silhouette. No  acute abnormalities. Electronically Signed   By: Lavonia Dana M.D.   On: 04/21/2019 12:26    Procedures Procedures (including critical care time)  Medications Ordered in ED Medications  acetaminophen (TYLENOL) tablet 650 mg (650 mg Oral Given  04/21/19 1114)  furosemide (LASIX) injection 20 mg (20 mg Intravenous Given 04/21/19 1329)     Initial Impression / Assessment and Plan / ED Course  I have reviewed the triage vital signs and the nursing notes.  Pertinent labs & imaging results that were available during my care of the patient were reviewed by me and considered in my medical decision making (see chart for details).        Patient presenting for evaluation of bilateral upper and lower extremity pain and swelling.  Physical exam reassuring, patient is nontoxic.  She does have mild pitting edema.  No history of CHF, history of A. fib and sleep apnea.  Consider new CHF.  Also consider renal cause of swelling.  Obtain labs, urine, x-ray, and EKG for further evaluation.  Lasix given for symptom management.  Labs overall reassuring.  Creatinine stable.  Hemoglobin electrolytes stable.  BNP mildly elevated at 125, although improved from previous.  X-ray shows cardiomegaly without pleural effusions.  EKG without STEMI.  On reassessment, patient reports symptoms improved with Lasix.  Will give short course of p.o. Lasix for home.  Encourage close follow-up with cardiology, consider possible need for echo versus chronic diuretics.  At this time, I do not believe patient needs to be admitted for any acute or life-threatening conditions.  At this time, patient appears safe for discharge. Return precautions given.  Patient states she understands and agrees to plan.   Final Clinical Impressions(s) / ED Diagnoses   Final diagnoses:  Peripheral edema  Cardiomegaly    ED Discharge Orders         Ordered    furosemide (LASIX) 20 MG tablet  Daily     04/21/19 1407           [place],  PA-C 04/21/19 1639    [place], Gwenyth Allegra, MD 04/21/19 [id number]

## 2019-04-22 MED FILL — FUROSEMIDE 20 MG TABS: 20 | 4 days supply | Qty: 4 | Fill #0

## 2019-04-25 ENCOUNTER — Ambulatory Visit: Payer: Medicaid Other | Attending: Family Medicine

## 2019-04-25 ENCOUNTER — Other Ambulatory Visit: Payer: Self-pay

## 2019-04-25 DIAGNOSIS — Z20828 Contact with and (suspected) exposure to other viral communicable diseases: Secondary | ICD-10-CM | POA: Diagnosis not present

## 2019-04-25 DIAGNOSIS — Z20822 Contact with and (suspected) exposure to covid-19: Secondary | ICD-10-CM

## 2019-04-25 NOTE — Progress Notes (Signed)
Patient arrived at the clinic for Covid testing. Testing was done and patient was informed that she will be contacted with the results.

## 2019-04-27 ENCOUNTER — Telehealth: Payer: Self-pay

## 2019-04-27 LAB — NOVEL CORONAVIRUS, NAA: SARS-CoV-2, NAA: NOT DETECTED

## 2019-04-27 NOTE — Telephone Encounter (Signed)
-----   Message from [name], MD sent at 04/27/2019  1:15 PM EDT ----- COVID-19 test is negative

## 2019-04-27 NOTE — Telephone Encounter (Signed)
Patient was called and a voicemail was left informing patient of lab results.

## 2019-04-28 MED FILL — XARELTO 20 MG TABLET: 20 | 30 days supply | Qty: 30 | Fill #0

## 2019-05-02 ENCOUNTER — Other Ambulatory Visit: Payer: Self-pay

## 2019-05-02 ENCOUNTER — Encounter: Payer: Self-pay | Admitting: Family Medicine

## 2019-05-02 ENCOUNTER — Ambulatory Visit: Payer: Medicaid Other | Attending: Family Medicine | Admitting: Family Medicine

## 2019-05-02 ENCOUNTER — Encounter: Payer: Self-pay | Admitting: Pharmacist

## 2019-05-02 ENCOUNTER — Ambulatory Visit (HOSPITAL_BASED_OUTPATIENT_CLINIC_OR_DEPARTMENT_OTHER): Payer: Medicaid Other | Admitting: Pharmacist

## 2019-05-02 VITALS — BP 138/81 | HR 81 | Temp 98.0°F | Ht 64.0 in | Wt 222.0 lb

## 2019-05-02 DIAGNOSIS — I4892 Unspecified atrial flutter: Secondary | ICD-10-CM | POA: Diagnosis not present

## 2019-05-02 DIAGNOSIS — I10 Essential (primary) hypertension: Secondary | ICD-10-CM | POA: Diagnosis not present

## 2019-05-02 DIAGNOSIS — Z8249 Family history of ischemic heart disease and other diseases of the circulatory system: Secondary | ICD-10-CM | POA: Insufficient documentation

## 2019-05-02 DIAGNOSIS — M25562 Pain in left knee: Secondary | ICD-10-CM

## 2019-05-02 DIAGNOSIS — Z7984 Long term (current) use of oral hypoglycemic drugs: Secondary | ICD-10-CM | POA: Diagnosis not present

## 2019-05-02 DIAGNOSIS — M25561 Pain in right knee: Secondary | ICD-10-CM

## 2019-05-02 DIAGNOSIS — M545 Low back pain, unspecified: Secondary | ICD-10-CM

## 2019-05-02 DIAGNOSIS — R600 Localized edema: Secondary | ICD-10-CM | POA: Diagnosis not present

## 2019-05-02 DIAGNOSIS — Z7901 Long term (current) use of anticoagulants: Secondary | ICD-10-CM | POA: Insufficient documentation

## 2019-05-02 DIAGNOSIS — M79643 Pain in unspecified hand: Secondary | ICD-10-CM | POA: Insufficient documentation

## 2019-05-02 DIAGNOSIS — Z8673 Personal history of transient ischemic attack (TIA), and cerebral infarction without residual deficits: Secondary | ICD-10-CM | POA: Diagnosis not present

## 2019-05-02 DIAGNOSIS — M7989 Other specified soft tissue disorders: Secondary | ICD-10-CM | POA: Diagnosis not present

## 2019-05-02 DIAGNOSIS — E119 Type 2 diabetes mellitus without complications: Secondary | ICD-10-CM

## 2019-05-02 DIAGNOSIS — H527 Unspecified disorder of refraction: Secondary | ICD-10-CM

## 2019-05-02 DIAGNOSIS — I480 Paroxysmal atrial fibrillation: Secondary | ICD-10-CM | POA: Insufficient documentation

## 2019-05-02 DIAGNOSIS — G8929 Other chronic pain: Secondary | ICD-10-CM | POA: Diagnosis not present

## 2019-05-02 DIAGNOSIS — Z79899 Other long term (current) drug therapy: Secondary | ICD-10-CM | POA: Insufficient documentation

## 2019-05-02 DIAGNOSIS — I482 Chronic atrial fibrillation, unspecified: Secondary | ICD-10-CM

## 2019-05-02 MED ORDER — FUROSEMIDE 20 MG PO TABS: 20.0000 mg | ORAL_TABLET | Freq: Every day | ORAL | 1 refills | Status: DC

## 2019-05-02 MED ORDER — RIVAROXABAN 20 MG PO TABS: 20.0000 mg | ORAL_TABLET | Freq: Every day | ORAL | 6 refills | Status: DC

## 2019-05-02 MED ORDER — METFORMIN HCL 500 MG PO TABS: 500.0000 mg | ORAL_TABLET | Freq: Two times a day (BID) | ORAL | 6 refills | Status: DC

## 2019-05-02 MED ORDER — ACCU-CHEK GUIDE VI STRP: ORAL_STRIP | 2 refills | Status: DC

## 2019-05-02 MED ORDER — DILTIAZEM HCL ER COATED BEADS 300 MG PO CP24: 300.0000 mg | ORAL_CAPSULE | Freq: Every day | ORAL | 6 refills | Status: DC

## 2019-05-02 MED ORDER — METOPROLOL TARTRATE 25 MG PO TABS: 12.5000 mg | ORAL_TABLET | Freq: Two times a day (BID) | ORAL | 6 refills | Status: DC

## 2019-05-02 MED ORDER — ACCU-CHEK GUIDE ME W/DEVICE KIT: 1.0000 | PACK | Freq: Every day | 0 refills | Status: AC

## 2019-05-02 MED ORDER — ACCU-CHEK FASTCLIX LANCETS MISC: 2 refills | Status: DC

## 2019-05-02 MED ORDER — DICLOFENAC SODIUM 1 % TD GEL: 4.0000 g | Freq: Four times a day (QID) | TRANSDERMAL | 3 refills | Status: DC

## 2019-05-02 MED ORDER — ATORVASTATIN CALCIUM 80 MG PO TABS: 80.0000 mg | ORAL_TABLET | Freq: Every day | ORAL | 6 refills | Status: DC

## 2019-05-02 MED FILL — ACCU-CHEK FASTCLIX LANCETS: 30 days supply | Qty: 102 | Fill #0

## 2019-05-02 MED FILL — FUROSEMIDE 20 MG TABS: 20 | 30 days supply | Qty: 30 | Fill #0

## 2019-05-02 MED FILL — CARTIA XT 300 MG CAPSULE SA: 300 | 30 days supply | Qty: 30 | Fill #0

## 2019-05-02 MED FILL — metFORMIN HCL 500 MG TABS: 500 | 30 days supply | Qty: 60 | Fill #0

## 2019-05-02 MED FILL — ACCU-CHEK GUIDE W/DEVICE KI: W/DEVICE | 30 days supply | Qty: 1 | Fill #0

## 2019-05-02 MED FILL — METOPROLOL TARTRATE 25 MG T: 25 | 30 days supply | Qty: 30 | Fill #0

## 2019-05-02 MED FILL — ACCU-CHEK GUIDE TEST STRIP: 30 days supply | Qty: 100 | Fill #0

## 2019-05-02 NOTE — Progress Notes (Signed)
Patient was educated on the use of the Accu Chek Guide blood glucose meter. Reviewed necessary supplies and operation of the meter. Also reviewed goal blood glucose levels. Patient was able to demonstrate use. All questions and concerns were addressed.  Reviewed MyPlate, carbohydrate planning, and other dietary recommendations for control of DM.   Reviewed metformin and MOA. Reviewed its role in DM control and counseling pearls. Pt instructed to take as prescribed with meals. All questions and concerns addressed.

## 2019-05-02 NOTE — Progress Notes (Signed)
Patient is having swelling in hands and legs.  Patient is also having pain in left knee, hands and lower back.  Patient is fasting and has not taken any morning medications.

## 2019-05-02 NOTE — Progress Notes (Signed)
Subjective:  Patient ID: [name], female    DOB: [birth year]  Age: 53 y.o. MRN: [id number]  CC: Knee Pain and Back Pain   HPI TASMINE HIPWELL is a 53 year old female with a history of atrial fibrillation/atrial flutter (status post cardioversion in 02/2016 currently on rate control with metoprolol and Cardizem and anticoagulation Xarelto s/p DCCV in 2017 ), cerebral infarction due to embolism of right middle cerebral artery status post IV TPA (in 10/2015),  right MCA stroke in 01/2018 (after running out of Xarelto) here for follow-up visit.  She complains of chronic low back pain worse with lifting, prolonged standing and is in her mid lower back but does not radiate down her lower extremity.  She works in home care.  She also complains of swelling of her hands and her legs with associated sharp pains in her lower extremity and hands.  She was concerned about the symptoms 10 days ago and so presented to the ED where she was placed on Lasix for 4 days and a chest x-ray revealed enlarged cardiac silhouette.  She denies worsening dyspnea or weight gain. Sometimes has pain on the lateral aspect of her left knee especially when she kneels on the bed while attempting to get on the bed. I had referred her to physical therapy at her last visit with me but notes revealed failure to reach the patient when PT called to schedule.  She had received a short course of prednisone which have provided some relief.  Labs today revealed a new diagnosis of diabetes with an A1c of 6.5 (she was previously 6.1). Last seen by her cardiologist Dr.Croitoru last month. She would like a referral to ophthalmology for her yearly eye exam  Past Medical History:  Diagnosis Date  . Clotting disorder ([place])    on xarelto  . Essential hypertension   . Heart murmur    a. 10/2015 Echo: EF 60-65%, no rwma, mild AI/MR, sev dil LA/RA, PASP 61mmHg.  [name] Noncompliance   . NSVT (nonsustained ventricular tachycardia) ([place])    a. 10/2015  during admission for CVA/AF.  [name] Paroxysmal A-fib ([place])    a. CHA2DS2VASC = 4-->Xarelto;  b. 02/2016 Successful DCCV.  [name] Pneumonia 2012 x 2; 2015  . Stroke [facility])    a. 09/2456 Embolic CVA of mid right middle cerebral atery - recieved TPA-->small amt of asymptomatic hemorrhagic transformation.  . Transient ischemic attack (TIA) 01/2013    Past Surgical History:  Procedure Laterality Date  . CARDIOVERSION N/A 02/18/2013   Procedure: CARDIOVERSION;  Surgeon: Sanda Klein, MD;  Location: [facility] ENDOSCOPY;  Service: Cardiovascular;  Laterality: N/A;  . CARDIOVERSION N/A 02/23/2013   Procedure: CARDIOVERSION;  Surgeon: Leonie Man, MD;  Location: [place];  Service: Cardiovascular;  Laterality: N/A;  BESIDE CV  . CARDIOVERSION N/A 03/12/2016   Procedure: CARDIOVERSION;  Surgeon: Larey Dresser, MD;  Location: [place];  Service: Cardiovascular;  Laterality: N/A;  . TEE WITHOUT CARDIOVERSION N/A 02/18/2013   Procedure: TRANSESOPHAGEAL ECHOCARDIOGRAM (TEE);  Surgeon: Sanda Klein, MD;  Location: [facility] ENDOSCOPY;  Service: Cardiovascular;  Laterality: N/A;  . TUBAL LIGATION  1992    Family History  Problem Relation Age of Onset  . Cancer Mother   . Atrial fibrillation Mother   . Breast cancer Mother   . Hypertension Father   . Atrial fibrillation Father   . Peripheral vascular disease Father   . Hypertension Other   . Diabetes Other   . Heart attack Neg Hx   .  Stroke Neg Hx   . Rectal cancer Neg Hx   . Colon cancer Neg Hx   . Esophageal cancer Neg Hx   . Stomach cancer Neg Hx     Allergies  Allergen Reactions  . Penicillins Hives and Other (See Comments)    Unknown  Has patient had a PCN reaction causing immediate rash, facial/tongue/throat swelling, SOB or lightheadedness with hypotension: No Has patient had a PCN reaction causing severe rash involving mucus membranes or skin necrosis: No Has patient had a PCN reaction that required hospitalization No Has patient had a PCN reaction  occurring within the last 10 years: No If all of the above answers are "NO", then may proceed with Cephalosporin use.    Outpatient Medications Prior to Visit  Medication Sig Dispense Refill  . acetaminophen (TYLENOL) 500 MG tablet Take 1,000 mg by mouth every 6 (six) hours as needed for headache.     . albuterol (PROVENTIL HFA;VENTOLIN HFA) 108 (90 Base) MCG/ACT inhaler Inhale 2 puffs into the lungs every 6 (six) hours as needed for wheezing or shortness of breath. 1 Inhaler 2  . butalbital-acetaminophen-caffeine (FIORICET) 50-325-40 MG tablet Take on tablet every 12 hours as needed (Patient taking differently: Take 1 tablet by mouth every 12 (twelve) hours as needed. ) 40 tablet 1  . cetirizine (ZYRTEC) 10 MG tablet Take 1 tablet (10 mg total) by mouth daily. 30 tablet 6  . fluticasone (FLONASE) 50 MCG/ACT nasal spray Place 2 sprays into both nostrils daily. 16 g 6  . gabapentin (NEURONTIN) 300 MG capsule TAKE 1 CAPSULE BY MOUTH 2 TIMES DAILY. 60 capsule 3  . methocarbamol (ROBAXIN) 500 MG tablet Take 1 tablet (500 mg total) by mouth every 8 (eight) hours as needed for muscle spasms. 90 tablet 1  . Misc. Devices MISC Blood pressure monitor.  Diagnosis Hypertension 1 each 0  . Vitamin D, Ergocalciferol, (DRISDOL) 1.25 MG (50000 UT) CAPS capsule TAKE 1 CAPSULE (50,000 UNITS TOTAL) BY MOUTH ONCE A WEEK. 9 capsule 0  . atorvastatin (LIPITOR) 80 MG tablet Take 1 tablet (80 mg total) by mouth daily at 6 PM. 30 tablet 6  . diclofenac sodium (VOLTAREN) 1 % GEL Apply 4 g topically 4 (four) times daily. 100 g 3  . furosemide (LASIX) 20 MG tablet Take 1 tablet (20 mg total) by mouth daily. 4 tablet 0  . rivaroxaban (XARELTO) 20 MG TABS tablet Take 1 tablet (20 mg total) by mouth daily with supper. 30 tablet 6  . diltiazem (CARDIZEM CD) 300 MG 24 hr capsule Take 1 capsule (300 mg total) by mouth daily for 30 days. 30 capsule 6  . metoprolol tartrate (LOPRESSOR) 25 MG tablet Take 0.5 tablets (12.5 mg total)  by mouth 2 (two) times daily for 30 days. 30 tablet 6   Facility-Administered Medications Prior to Visit  Medication Dose Route Frequency Provider Last Rate Last Dose  . 0.9 %  sodium chloride infusion  500 mL Intravenous Once Armbruster, Carlota Raspberry, MD         ROS Review of Systems  Constitutional: Negative for activity change, appetite change and fatigue.  HENT: Negative for congestion, sinus pressure and sore throat.   Eyes: Negative for visual disturbance.  Respiratory: Negative for cough, chest tightness, shortness of breath and wheezing.   Cardiovascular: Negative for chest pain and palpitations.  Gastrointestinal: Negative for abdominal distention, abdominal pain and constipation.  Endocrine: Negative for polydipsia.  Genitourinary: Negative for dysuria and frequency.  Musculoskeletal:  See hpi  Skin: Negative for rash.  Neurological: Negative for tremors, light-headedness and numbness.  Hematological: Does not bruise/bleed easily.  Psychiatric/Behavioral: Negative for agitation and behavioral problems.    Objective:  BP 138/81   Pulse 81   Temp 98 F (36.7 C) (Oral)   Ht 5\' 4"  (1.626 m)   Wt 222 lb (100.7 kg)   SpO2 100%   BMI 38.11 kg/m   BP/Weight 05/02/2019 04/21/2019 3/00/9233  Systolic BP 007 622 633  Diastolic BP 81 75 87  Wt. (Lbs) 222 - 222.6  BMI 38.11 - 38.21      Physical Exam Constitutional:      Appearance: She is well-developed.  Cardiovascular:     Rate and Rhythm: Normal rate. Rhythm irregular.     Heart sounds: Normal heart sounds. No murmur.  Pulmonary:     Effort: Pulmonary effort is normal.     Breath sounds: Normal breath sounds. No wheezing or rales.  Chest:     Chest wall: No tenderness.  Abdominal:     General: Bowel sounds are normal. There is no distension.     Palpations: Abdomen is soft. There is no mass.     Tenderness: There is no abdominal tenderness.  Musculoskeletal: Normal range of motion.     Comments: Normal  appearance of hands and knees with normal range of motion Slight lumbar spine tenderness on deep palpation; negative straight leg raise bilaterally  Neurological:     Mental Status: She is alert and oriented to person, place, and time.     CMP Latest Ref Rng & Units 04/21/2019 09/29/2018 08/23/2018  Glucose 70 - 99 mg/dL 98 102(H) 81  BUN 6 - 20 mg/dL 10 14 13   Creatinine 0.44 - 1.00 mg/dL 0.87 0.85 0.97  Sodium 135 - 145 mmol/L 138 143 142  Potassium 3.5 - 5.1 mmol/L 4.1 4.0 4.1  Chloride 98 - 111 mmol/L 106 104 102  CO2 22 - 32 mmol/L 21(L) 20 24  Calcium 8.9 - 10.3 mg/dL 9.3 9.6 9.4  Total Protein 6.5 - 8.1 g/dL 7.5 7.4 -  Total Bilirubin 0.3 - 1.2 mg/dL 0.5 0.4 -  Alkaline Phos 38 - 126 U/L 79 104 -  AST 15 - 41 U/L 24 20 -  ALT 0 - 44 U/L 20 18 -    Lipid Panel     Component Value Date/Time   CHOL 118 09/29/2018 0959   TRIG 67 09/29/2018 0959   HDL 48 09/29/2018 0959   CHOLHDL 2.5 09/29/2018 0959   CHOLHDL 3.1 02/09/2018 0531   VLDL 17 02/09/2018 0531   LDLCALC 57 09/29/2018 0959    CBC    Component Value Date/Time   WBC 8.9 04/21/2019 1104   RBC 4.91 04/21/2019 1104   HGB 13.1 04/21/2019 1104   HCT 42.7 04/21/2019 1104   PLT 205 04/21/2019 1104   MCV 87.0 04/21/2019 1104   MCH 26.7 04/21/2019 1104   MCHC 30.7 04/21/2019 1104   RDW 15.2 04/21/2019 1104   LYMPHSABS 2.3 04/21/2019 1104   MONOABS 0.8 04/21/2019 1104   EOSABS 0.2 04/21/2019 1104   BASOSABS 0.1 04/21/2019 1104    Lab Results  Component Value Date   HGBA1C 6.1 (H) 02/09/2018    Assessment & Plan:   1. Refractive errors - Ambulatory referral to Ophthalmology  2. Type 2 diabetes mellitus without complication, without long-term current use of insulin ([place]) Newly diagnosed with A1c of 6.5 Commence metformin Clinical pharmacist called in for diabetic  teaching - Ambulatory referral to Ophthalmology - atorvastatin (LIPITOR) 80 MG tablet; Take 1 tablet (80 mg total) by mouth daily at 6 PM.   Dispense: 30 tablet; Refill: 6 - metFORMIN (GLUCOPHAGE) 500 MG tablet; Take 1 tablet (500 mg total) by mouth 2 (two) times daily with a meal.  Dispense: 60 tablet; Refill: 6  3. Chronic pain of both knees Likely underlying osteoarthritis I had previously referred her for physical therapy and she has been provided with the number to schedule an appointment If symptoms persist, consider referral to orthopedic - DG Knee Complete 4 Views Left; Future - DG Knee Complete 4 Views Right; Future  4. Localized edema Edema is absent at this time We will order echocardiogram to evaluate cardiac function Placed on low-dose Lasix - ECHOCARDIOGRAM COMPLETE; Future - furosemide (LASIX) 20 MG tablet; Take 1 tablet (20 mg total) by mouth daily.  Dispense: 30 tablet; Refill: 1  5. Chronic midline low back pain without sciatica Likely degenerative disc disease Previously referred for physical therapy-advised to follow through with this Consider orthopedic referral if persisting Apply heat Unable to use NSAIDs due to being on Xarelto - diclofenac sodium (VOLTAREN) 1 % GEL; Apply 4 g topically 4 (four) times daily.  Dispense: 100 g; Refill: 3  6. Essential hypertension Controlled Counseled on blood pressure goal of less than 130/80, low-sodium, DASH diet, medication compliance, 150 minutes of moderate intensity exercise per week. Discussed medication compliance, adverse effects. - metoprolol tartrate (LOPRESSOR) 25 MG tablet; Take 0.5 tablets (12.5 mg total) by mouth 2 (two) times daily.  Dispense: 30 tablet; Refill: 6  7. Chronic atrial fibrillation Chronic A. fib Seen by cardiology last month - ECHOCARDIOGRAM COMPLETE; Future - rivaroxaban (XARELTO) 20 MG TABS tablet; Take 1 tablet (20 mg total) by mouth daily with supper.  Dispense: 30 tablet; Refill: 6 - diltiazem (CARDIZEM CD) 300 MG 24 hr capsule; Take 1 capsule (300 mg total) by mouth daily.  Dispense: 30 capsule; Refill: 6    Meds ordered  this encounter  Medications  . DISCONTD: furosemide (LASIX) 20 MG tablet    Sig: Take 1 tablet (20 mg total) by mouth daily.    Dispense:  30 tablet    Refill:  1  . metoprolol tartrate (LOPRESSOR) 25 MG tablet    Sig: Take 0.5 tablets (12.5 mg total) by mouth 2 (two) times daily.    Dispense:  30 tablet    Refill:  6  . rivaroxaban (XARELTO) 20 MG TABS tablet    Sig: Take 1 tablet (20 mg total) by mouth daily with supper.    Dispense:  30 tablet    Refill:  6  . diltiazem (CARDIZEM CD) 300 MG 24 hr capsule    Sig: Take 1 capsule (300 mg total) by mouth daily.    Dispense:  30 capsule    Refill:  6  . atorvastatin (LIPITOR) 80 MG tablet    Sig: Take 1 tablet (80 mg total) by mouth daily at 6 PM.    Dispense:  30 tablet    Refill:  6  . diclofenac sodium (VOLTAREN) 1 % GEL    Sig: Apply 4 g topically 4 (four) times daily.    Dispense:  100 g    Refill:  3  . metFORMIN (GLUCOPHAGE) 500 MG tablet    Sig: Take 1 tablet (500 mg total) by mouth 2 (two) times daily with a meal.    Dispense:  60 tablet    Refill:  6  . furosemide (LASIX) 20 MG tablet    Sig: Take 1 tablet (20 mg total) by mouth daily.    Dispense:  30 tablet    Refill:  1    Follow-up: Return in about 3 months (around 08/02/2019) for medical conditions.       [name], MD, FAAFP. [facility] [place], [place]   05/02/2019, 11:00 AM

## 2019-05-04 ENCOUNTER — Telehealth: Payer: Self-pay | Admitting: Family Medicine

## 2019-05-04 DIAGNOSIS — M545 Low back pain, unspecified: Secondary | ICD-10-CM

## 2019-05-04 DIAGNOSIS — M19041 Primary osteoarthritis, right hand: Secondary | ICD-10-CM

## 2019-05-04 DIAGNOSIS — M19042 Primary osteoarthritis, left hand: Secondary | ICD-10-CM

## 2019-05-04 DIAGNOSIS — G8929 Other chronic pain: Secondary | ICD-10-CM

## 2019-05-04 DIAGNOSIS — M25562 Pain in left knee: Secondary | ICD-10-CM

## 2019-05-04 NOTE — Telephone Encounter (Signed)
Patient states that referral placed on 02/2019 need to be redone so that she can schedule an appointment.

## 2019-05-04 NOTE — Telephone Encounter (Addendum)
Pt called because her physical therapy referral expired and would like for it to be resent again..please follow up

## 2019-05-05 NOTE — Telephone Encounter (Signed)
Referral placed.

## 2019-05-06 ENCOUNTER — Ambulatory Visit (HOSPITAL_COMMUNITY): Admission: RE | Admit: 2019-05-06 | Payer: Medicaid Other | Source: Ambulatory Visit

## 2019-05-06 NOTE — Telephone Encounter (Signed)
Patient was called and informed of referral being placed. 

## 2019-05-09 ENCOUNTER — Other Ambulatory Visit: Payer: Self-pay

## 2019-05-09 ENCOUNTER — Ambulatory Visit (HOSPITAL_COMMUNITY)
Admission: RE | Admit: 2019-05-09 | Discharge: 2019-05-09 | Disposition: A | Discharge status: Home / Self Care | Payer: Medicaid Other | Source: Ambulatory Visit | Attending: Family Medicine | Admitting: Family Medicine

## 2019-05-09 DIAGNOSIS — I482 Chronic atrial fibrillation, unspecified: Secondary | ICD-10-CM | POA: Insufficient documentation

## 2019-05-09 DIAGNOSIS — E669 Obesity, unspecified: Secondary | ICD-10-CM | POA: Insufficient documentation

## 2019-05-09 DIAGNOSIS — I4892 Unspecified atrial flutter: Secondary | ICD-10-CM | POA: Diagnosis not present

## 2019-05-09 DIAGNOSIS — F17200 Nicotine dependence, unspecified, uncomplicated: Secondary | ICD-10-CM | POA: Diagnosis not present

## 2019-05-09 DIAGNOSIS — I119 Hypertensive heart disease without heart failure: Secondary | ICD-10-CM | POA: Diagnosis not present

## 2019-05-09 DIAGNOSIS — R600 Localized edema: Secondary | ICD-10-CM | POA: Diagnosis not present

## 2019-05-09 DIAGNOSIS — I351 Nonrheumatic aortic (valve) insufficiency: Secondary | ICD-10-CM | POA: Insufficient documentation

## 2019-05-09 NOTE — Progress Notes (Signed)
  Echocardiogram 2D Echocardiogram has been performed.  [name] 05/09/2019, 3:05 PM

## 2019-05-12 ENCOUNTER — Telehealth: Payer: Self-pay

## 2019-05-12 NOTE — Telephone Encounter (Signed)
Patient name and DOB has been verified Patient was informed of lab results. Patient had no questions.  

## 2019-05-12 NOTE — Telephone Encounter (Signed)
-----   Message from [name], MD sent at 05/11/2019  1:31 PM EDT ----- Her echocardiogram reveals a normal ejection fraction compared to last year.  New finding includes a possible blood clot in her left atrium.  I will need her to schedule an appointment with her cardiologist to discuss this given she is already on anticoagulation.

## 2019-05-17 ENCOUNTER — Encounter: Payer: Self-pay | Admitting: Cardiology

## 2019-05-17 ENCOUNTER — Other Ambulatory Visit: Payer: Self-pay

## 2019-05-17 ENCOUNTER — Ambulatory Visit (INDEPENDENT_AMBULATORY_CARE_PROVIDER_SITE_OTHER): Payer: Medicaid Other | Admitting: Cardiology

## 2019-05-17 VITALS — BP 100/72 | HR 72 | Temp 97.2°F | Ht 63.5 in | Wt 222.8 lb

## 2019-05-17 DIAGNOSIS — I482 Chronic atrial fibrillation, unspecified: Secondary | ICD-10-CM

## 2019-05-17 DIAGNOSIS — R600 Localized edema: Secondary | ICD-10-CM | POA: Diagnosis not present

## 2019-05-17 DIAGNOSIS — Z7901 Long term (current) use of anticoagulants: Secondary | ICD-10-CM

## 2019-05-17 DIAGNOSIS — Z8673 Personal history of transient ischemic attack (TIA), and cerebral infarction without residual deficits: Secondary | ICD-10-CM

## 2019-05-17 DIAGNOSIS — G4733 Obstructive sleep apnea (adult) (pediatric): Secondary | ICD-10-CM | POA: Diagnosis not present

## 2019-05-17 DIAGNOSIS — I5032 Chronic diastolic (congestive) heart failure: Secondary | ICD-10-CM | POA: Diagnosis not present

## 2019-05-17 DIAGNOSIS — R0683 Snoring: Secondary | ICD-10-CM | POA: Diagnosis not present

## 2019-05-17 DIAGNOSIS — R5383 Other fatigue: Secondary | ICD-10-CM | POA: Diagnosis not present

## 2019-05-17 MED ORDER — FUROSEMIDE 20 MG PO TABS: 20.0000 mg | ORAL_TABLET | ORAL | 1 refills | Status: DC | PRN

## 2019-05-17 NOTE — Progress Notes (Signed)
Cardiology Office Note:    Date:  05/17/2019   ID:  [name], DOB 01/10/1966, MRN [id number]  PCP:  [name], MD  Cardiologist:  Dr Sallyanne Kuster  Electrophysiologist:  None   Referring MD: [name], MD   Chief Complaint  Patient presents with  . Follow-up  From recent ED visit  History of Present Illness:    [name] is a 53 y.o. 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 Fortunately she has no residual effects. She had DCCV in June 2017 but failed to hold NSR and is now in CAF. She has been on Xarelto since. She had bi atrial enlargement on echo then and Dr Sallyanne Kuster did not think an anti arrhythmic would be effective.   In May 2019 she had a recurrent CVA after she missed a few days of Xarelto secondary to cost..  She again recovered without residual.   She was seen in the office 07/03/18  after an admission in Sept 2019 for acute respiratory failure felt to be a combination of COPD exacerbation and diastolic CHF.  Her LOV with Dr Sallyanne Kuster was 04/04/2019 and she was doing well.  She presents today after she was seen by her PCP for LE edema.  She denies any unusual sodium intake. Lasix 20 mg was added with resolution of her symptoms.  Echo showed no significant change with normal LVF.    Past Medical History:  Diagnosis Date  . Clotting disorder ([place])    on xarelto  . Essential hypertension   . Heart murmur    a. 10/2015 Echo: EF 60-65%, no rwma, mild AI/MR, sev dil LA/RA, PASP 93mHg.  .Marland KitchenNoncompliance   . NSVT (nonsustained ventricular tachycardia) ([place]    a. 10/2015 during admission for CVA/AF.  .Marland KitchenParoxysmal A-fib ([place]    a. CHA2DS2VASC = 4-->Xarelto;  b. 02/2016 Successful DCCV.  .Marland KitchenPneumonia 2012 x 2; 2015  . Stroke ([facility]    a. 27/8588Embolic CVA of mid right middle cerebral atery - recieved TPA-->small amt of asymptomatic hemorrhagic transformation.  . Transient ischemic attack (TIA) 01/2013    Past Surgical History:   Procedure Laterality Date  . CARDIOVERSION N/A 02/18/2013   Procedure: CARDIOVERSION;  Surgeon: MSanda Klein MD;  [facility];  Service: Cardiovascular;  Laterality: N/A;  . CARDIOVERSION N/A 02/23/2013   Procedure: CARDIOVERSION;  Surgeon: DLeonie Man MD;  [place]  Service: Cardiovascular;  Laterality: N/A;  BESIDE CV  . CARDIOVERSION N/A 03/12/2016   Procedure: CARDIOVERSION;  Surgeon: DLarey Dresser MD;  [place]  Service: Cardiovascular;  Laterality: N/A;  . TEE WITHOUT CARDIOVERSION N/A 02/18/2013   Procedure: TRANSESOPHAGEAL ECHOCARDIOGRAM (TEE);  Surgeon: MSanda Klein MD;  [facility];  Service: Cardiovascular;  Laterality: N/A;  . TUBAL LIGATION  1992    Current Medications: Current Meds  Medication Sig  . Accu-Chek FastClix Lancets MISC Use as instructed to check blood sugar once daily.  .Marland Kitchenacetaminophen (TYLENOL) 500 MG tablet Take 1,000 mg by mouth every 6 (six) hours as needed for headache.   . albuterol (PROVENTIL HFA;VENTOLIN HFA) 108 (90 Base) MCG/ACT inhaler Inhale 2 puffs into the lungs every 6 (six) hours as needed for wheezing or shortness of breath.  .Marland Kitchenatorvastatin (LIPITOR) 80 MG tablet Take 1 tablet (80 mg total) by mouth daily at 6 PM.  . Blood Glucose Monitoring Suppl (ACCU-CHEK GUIDE ME) w/Device KIT 1 kit by Does not  apply route daily.  . butalbital-acetaminophen-caffeine (FIORICET) 50-325-40 MG tablet Take on tablet every 12 hours as needed (Patient taking differently: Take 1 tablet by mouth every 12 (twelve) hours as needed. )  . cetirizine (ZYRTEC) 10 MG tablet Take 1 tablet (10 mg total) by mouth daily.  . diclofenac sodium (VOLTAREN) 1 % GEL Apply 4 g topically 4 (four) times daily.  [name] diltiazem (CARDIZEM CD) 300 MG 24 hr capsule Take 1 capsule (300 mg total) by mouth daily.  . fluticasone (FLONASE) 50 MCG/ACT nasal spray Place 2 sprays into both nostrils daily.  . furosemide (LASIX) 20 MG tablet Take 1  tablet (20 mg total) by mouth daily.  [name] gabapentin (NEURONTIN) 300 MG capsule TAKE 1 CAPSULE BY MOUTH 2 TIMES DAILY.  [name] glucose blood (ACCU-CHEK GUIDE) test strip Use as instructed to check blood sugar once daily.  . metFORMIN (GLUCOPHAGE) 500 MG tablet Take 1 tablet (500 mg total) by mouth 2 (two) times daily with a meal.  . methocarbamol (ROBAXIN) 500 MG tablet Take 1 tablet (500 mg total) by mouth every 8 (eight) hours as needed for muscle spasms.  . metoprolol tartrate (LOPRESSOR) 25 MG tablet Take 0.5 tablets (12.5 mg total) by mouth 2 (two) times daily.  . Misc. Devices MISC Blood pressure monitor.  Diagnosis Hypertension  . rivaroxaban (XARELTO) 20 MG TABS tablet Take 1 tablet (20 mg total) by mouth daily with supper.  . Vitamin D, Ergocalciferol, (DRISDOL) 1.25 MG (50000 UT) CAPS capsule TAKE 1 CAPSULE (50,000 UNITS TOTAL) BY MOUTH ONCE A WEEK.   Current Facility-Administered Medications for the 05/17/19 encounter (Office Visit) with Erlene Quan, PA-C  Medication  . 0.9 %  sodium chloride infusion     Allergies:   Penicillins   Social History   Socioeconomic History  . Marital status: Single    Spouse name: Not on file  . Number of children: Not on file  . Years of education: Not on file  . Highest education level: Not on file  Occupational History  . Occupation: Airline pilot: [employer]  . Occupation: in home health aide  Social Needs  . Financial resource strain: Not on file  . Food insecurity    Worry: Not on file    Inability: Not on file  . Transportation needs    Medical: Not on file    Non-medical: Not on file  Tobacco Use  . Smoking status: Former Smoker    Years: 3.00    Types: Cigarettes    Quit date: 03/15/2013    Years since quitting: 6.1  . Smokeless tobacco: Never Used  Substance and Sexual Activity  . Alcohol use: Yes    Comment: drinks on the weekends, 2 drinks and 2 beers  . Drug use: No  . Sexual activity: Not Currently     Birth control/protection: None  Lifestyle  . Physical activity    Days per week: Not on file    Minutes per session: Not on file  . Stress: Not on file  Relationships  . Social Herbalist on phone: Not on file    Gets together: Not on file    Attends religious service: Not on file    Active member of club or organization: Not on file    Attends meetings of clubs or organizations: Not on file    Relationship status: Not on file  Other Topics Concern  . Not on file  Social History Narrative  .  Not on file     Family History: The patient's family history includes Atrial fibrillation in her father and mother; Breast cancer in her mother; Cancer in her mother; Diabetes in an other family member; Hypertension in her father and another family member; Peripheral vascular disease in her father. There is no history of Heart attack, Stroke, Rectal cancer, Colon cancer, Esophageal cancer, or Stomach cancer.  ROS:   Please see the history of present illness.     All other systems reviewed and are negative.  EKGs/Labs/Other Studies Reviewed:    The following studies were reviewed today: Echo 05/09/2019  EKG:  EKG is ordered today.  The ekg ordered today demonstrates AF with CVR, inferior lateral TWI,   Recent Labs: 04/21/2019: ALT 20; B Natriuretic Peptide 125.6; BUN 10; Creatinine, Ser 0.87; Hemoglobin 13.1; Platelets 205; Potassium 4.1; Sodium 138  Recent Lipid Panel    Component Value Date/Time   CHOL 118 09/29/2018 0959   TRIG 67 09/29/2018 0959   HDL 48 09/29/2018 0959   CHOLHDL 2.5 09/29/2018 0959   CHOLHDL 3.1 02/09/2018 0531   VLDL 17 02/09/2018 0531   LDLCALC 57 09/29/2018 0959    Physical Exam:    VS:  BP 100/72   Pulse 72   Temp (!) 97.2 F (36.2 C)   Ht 5' 3.5" (1.613 m)   Wt 222 lb 12.8 oz (101.1 kg)   SpO2 96%   BMI 38.85 kg/m     Wt Readings from Last 3 Encounters:  05/17/19 222 lb 12.8 oz (101.1 kg)  05/02/19 222 lb (100.7 kg)  04/04/19 222 lb  9.6 oz (101 kg)     GEN: Overweight AA female, in no acute distress HEENT: Normal NECK: No JVD; No carotid bruits LYMPHATICS: No lymphadenopathy CARDIAC: irregularly irregular, no murmurs, rubs, gallops RESPIRATORY:  Clear to auscultation without rales, wheezing or rhonchi  ABDOMEN: Soft, non-tender, non-distended MUSCULOSKELETAL:  No edema; No deformity  SKIN: Warm and dry NEUROLOGIC:  Alert and oriented x 3 PSYCHIATRIC:  Normal affect   ASSESSMENT:    Edema leg Seen in ED with LE edema- Lasix 20 mg added Echo 05/09/2019 showed preserved LVF- no change  Chronic diastolic CHF (congestive heart failure) ([place]) Pt admitted 9/27-9/29/19 with SOB-   Chronic atrial fibrillation (HCC)  Bi atrial enlargement,  not a candidate for antiarrythmic   Chronic anticoagulation CHADS VASC=5. She is on Xarelto  History of CVA (cerebrovascular accident) CVA Feb 2017 due to embolism of right middle cerebral artery [facility]) s/p IV tPA Recurrent CVA May 2019 after she missed Xarelto 4-5 days ($)   PLAN:    Change Lasix to PRN.  F/U Dr Sallyanne Kuster July 2021   Medication Adjustments/Labs and Tests Ordered: Current medicines are reviewed at length with the patient today.  Concerns regarding medicines are outlined above.  No orders of the defined types were placed in this encounter.  No orders of the defined types were placed in this encounter.   Patient Instructions  Medication Instructions:  Take Lasix on a as needed basis If you need a refill on your cardiac medications before your next appointment, please call your pharmacy.   Lab work: None  If you have labs (blood work) drawn today and your tests are completely normal, you will receive your results only by: [name] MyChart Message (if you have MyChart) OR . A paper copy in the mail If you have any lab test that is abnormal or we need to change your treatment, we will  call you to review the results.  Testing/Procedures: None    Follow-Up: At [facility], you and your health needs are our priority.  As part of our continuing mission to provide you with exceptional heart care, we have created designated Provider Care Teams.  These Care Teams include your primary Cardiologist (physician) and Advanced Practice Providers (APPs -  Physician Assistants and Nurse Practitioners) who all work together to provide you with the care you need, when you need it. You will need a follow up appointment in 10 months.  Please call our office 2 months in advance to schedule this appointment.  You may see Dr Jetty Duhamel Croitoru or one of the following Advanced Practice Providers on your designated Care Team:   Kerin Ransom, PA-C [place], [place] . Sande Rives, PA-C  Any Other Special Instructions Will Be Listed Below (If Applicable).      Signed, Kerin Ransom, PA-C  05/17/2019 2:10 PM    [facility]

## 2019-05-17 NOTE — Assessment & Plan Note (Signed)
Seen in ED with LE edema- Lasix 20 mg added Echo 05/09/2019 showed preserved LVF- no change

## 2019-05-17 NOTE — Patient Instructions (Signed)
Medication Instructions:  Take Lasix on a as needed basis If you need a refill on your cardiac medications before your next appointment, please call your pharmacy.   Lab work: None  If you have labs (blood work) drawn today and your tests are completely normal, you will receive your results only by: [name] MyChart Message (if you have MyChart) OR . A paper copy in the mail If you have any lab test that is abnormal or we need to change your treatment, we will call you to review the results.  Testing/Procedures: None   Follow-Up: At [facility], you and your health needs are our priority.  As part of our continuing mission to provide you with exceptional heart care, we have created designated Provider Care Teams.  These Care Teams include your primary Cardiologist (physician) and Advanced Practice Providers (APPs -  Physician Assistants and Nurse Practitioners) who all work together to provide you with the care you need, when you need it. You will need a follow up appointment in 10 months.  Please call our office 2 months in advance to schedule this appointment.  You may see Dr Jetty Duhamel Croitoru or one of the following Advanced Practice Providers on your designated Care Team:   Kerin Ransom, PA-C [place], [place] . Sande Rives, PA-C  Any Other Special Instructions Will Be Listed Below (If Applicable).

## 2019-05-18 MED FILL — VIT D2 1.25 MG (50,000 UNIT: 1.25 MG | 28 days supply | Qty: 4 | Fill #1

## 2019-05-18 MED FILL — GABAPENTIN 300 MG CAPSULE: 300 | 30 days supply | Qty: 60 | Fill #1

## 2019-05-18 MED FILL — HYDROCHLOROTHIAZIDE 12.5 MG: 12.5 | 90 days supply | Qty: 90 | Fill #6

## 2019-05-18 NOTE — Telephone Encounter (Signed)
Patient name and DOB has been verified Patient was informed of lab results. Patient had no questions.  Patient states that she saw her cardiologist yesterday and he informed her that everything is ok and to follow up with him in 1 year.

## 2019-05-19 ENCOUNTER — Other Ambulatory Visit: Payer: Self-pay

## 2019-05-19 ENCOUNTER — Ambulatory Visit: Payer: Medicaid Other | Attending: Family Medicine

## 2019-05-19 DIAGNOSIS — M25561 Pain in right knee: Secondary | ICD-10-CM | POA: Insufficient documentation

## 2019-05-19 DIAGNOSIS — M6281 Muscle weakness (generalized): Secondary | ICD-10-CM | POA: Diagnosis not present

## 2019-05-19 DIAGNOSIS — M2560 Stiffness of unspecified joint, not elsewhere classified: Secondary | ICD-10-CM | POA: Diagnosis not present

## 2019-05-19 DIAGNOSIS — M25652 Stiffness of left hip, not elsewhere classified: Secondary | ICD-10-CM | POA: Insufficient documentation

## 2019-05-19 DIAGNOSIS — M545 Low back pain, unspecified: Secondary | ICD-10-CM

## 2019-05-19 DIAGNOSIS — M25651 Stiffness of right hip, not elsewhere classified: Secondary | ICD-10-CM | POA: Insufficient documentation

## 2019-05-19 DIAGNOSIS — M25562 Pain in left knee: Secondary | ICD-10-CM | POA: Insufficient documentation

## 2019-05-19 DIAGNOSIS — G8929 Other chronic pain: Secondary | ICD-10-CM | POA: Diagnosis not present

## 2019-05-19 NOTE — Therapy (Signed)
[place], [place], [address] Phone: [phone]   Fax:  [phone]  Physical Therapy Evaluation  Patient Details  Name: [name] MRN: [id number] Date of Birth: 07/09/1966 Referring Provider (PT): [name], MD   Encounter Date: 05/19/2019  PT End of Session - 05/19/19 1504    Visit Number  1    Number of Visits  12    Date for PT Re-Evaluation  07/01/19    Authorization Type  MCD    PT Start Time  0247    PT Stop Time  0330    PT Time Calculation (min)  43 min    Activity Tolerance  Patient tolerated treatment well    Behavior During Therapy  [facility] for tasks assessed/performed       Past Medical History:  Diagnosis Date  . Clotting disorder ([place])    on xarelto  . Essential hypertension   . Heart murmur    a. 10/2015 Echo: EF 60-65%, no rwma, mild AI/MR, sev dil LA/RA, PASP 75mmHg.  [name] Noncompliance   . NSVT (nonsustained ventricular tachycardia) ([place])    a. 10/2015 during admission for CVA/AF.  [name] Paroxysmal A-fib ([place])    a. CHA2DS2VASC = 4-->Xarelto;  b. 02/2016 Successful DCCV.  [name] Pneumonia 2012 x 2; 2015  . Stroke [facility])    a. 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  . Transient ischemic attack (TIA) 01/2013    Past Surgical History:  Procedure Laterality Date  . CARDIOVERSION N/A 02/18/2013   Procedure: CARDIOVERSION;  Surgeon: Sanda Klein, MD;  Location: [place] [facility];  Service: Cardiovascular;  Laterality: N/A;  . CARDIOVERSION N/A 02/23/2013   Procedure: CARDIOVERSION;  Surgeon: Leonie Man, MD;  Location: [place];  Service: Cardiovascular;  Laterality: N/A;  BESIDE CV  . CARDIOVERSION N/A 03/12/2016   Procedure: CARDIOVERSION;  Surgeon: Larey Dresser, MD;  Location: [place];  Service: Cardiovascular;  Laterality: N/A;  . TEE WITHOUT CARDIOVERSION N/A 02/18/2013   Procedure: TRANSESOPHAGEAL  ECHOCARDIOGRAM (TEE);  Surgeon: Sanda Klein, MD;  Location: [facility];  Service: Cardiovascular;  Laterality: N/A;  . TUBAL LIGATION  1992    There were no vitals filed for this visit.   Subjective Assessment - 05/19/19 1451    Subjective  She reports back and knee pain.  Duration of 6 months. No  LBP prior to this onset.    She thinks  staying at home started  the pain . Pulling and lifting at home and work increased pain. She is on medication with some benefit.    Pertinent History  CVA x 3   2019,   Atral Fib,    Limitations  Standing;House hold activities    How long can you sit comfortably?  as needed    How long can you stand comfortably?  washing dishes, bending   is able as needed but painful with activity    How long can you walk comfortably?  As needed . Limited by breathing and heart issues    Diagnostic tests  None    Patient Stated Goals  Decrease back pain.    Currently in Pain?  Yes    Pain Score  2     Pain Location  Back    Pain Orientation  Lower   middle   Pain Descriptors / Indicators  Throbbing    Pain Type  Chronic pain    Pain  Radiating Towards  No    Pain Onset  More than a month ago    Pain Frequency  Intermittent    Aggravating Factors   standing , bending pulling , lifting    Pain Relieving Factors  sitting , meds    Multiple Pain Sites  Yes    Pain Score  2    Pain Location  Knee    Pain Orientation  Left;Right;Anterior    Pain Descriptors / Indicators  Aching    Pain Type  Chronic pain    Pain Onset  More than a month ago    Pain Frequency  Intermittent    Aggravating Factors   kneeling to get in bed    Pain Relieving Factors  nothing         [facility] PT Assessment - 05/19/19 0001      Assessment   Medical Diagnosis  chronic LBP , knee pain    Referring Provider (PT)  [name], MD    Onset Date/Surgical Date  --   6 monthe ago   Next MD Visit  07/2019    Prior Therapy  No      Precautions   Precautions  None       Restrictions   Weight Bearing Restrictions  No      Balance Screen   Has the patient fallen in the past 6 months  Yes    How many times?  1   in tub   Has the patient had a decrease in activity level because of a fear of falling?   No    Is the patient reluctant to leave their home because of a fear of falling?   No      Prior Function   Level of Independence  Independent      Cognition   Overall Cognitive Status  Within Functional Limits for tasks assessed      Posture/Postural Control   Posture Comments  LT shoulder and ilium higher than RT.       ROM / Strength   AROM / PROM / Strength  AROM;Strength;PROM      AROM   AROM Assessment Site  Lumbar    Lumbar Flexion  50    Lumbar Extension  20    Lumbar - Right Side Bend  15    Lumbar - Left Side Bend  15    Lumbar - Right Rotation  60    Lumbar - Left Rotation  60      PROM   PROM Assessment Site  Hip    Right/Left Hip  Left;Right    Right Hip External Rotation   45    Right Hip Internal Rotation   10    Left Hip Flexion  100    Left Hip External Rotation   45    Left Hip Internal Rotation   10    Left Hip ADduction  15      Strength   Overall Strength Comments  LE WNL, poor abdoinals      Right Hip   Right Hip Flexion  103    Right Hip ADduction  15      Flexibility   Soft Tissue Assessment /Muscle Length  yes    Hamstrings  65 degrees       Palpation   Palpation comment  tender over lumbar spine and both gluteals      Special Tests    Special Tests  Hip Special Tests  Hip Special Tests   Ober's Test      Ober's Test   Findings  Positive    Side  Right;Left                Objective measurements completed on examination: See above findings.              PT Education - 05/19/19 1542    Education Details  POC HEP    Person(s) Educated  Patient    Methods  Explanation;Tactile cues;Verbal cues;Handout    Comprehension  Returned demonstration;Verbalized understanding        PT Short Term Goals - 05/19/19 1544      PT SHORT TERM GOAL #1   Title  She will be independent with initial HEP.    Baseline  No program    Time  2    Period  Weeks    Status  New      PT SHORT TERM GOAL #2   Title  She will report pain in back decr 20% overall    Baseline  varies from 2-8/10 depend on activity    Time  2    Period  Weeks    Status  New      PT SHORT TERM GOAL #3   Title  She will demo aware of good posture sitting and standing    Baseline  incr lordosis    Time  2    Period  Weeks    Status  New        PT Long Term Goals - 05/19/19 1548      PT LONG TERM GOAL #1   Title  She will be indpendent with all HEP issued    Baseline  indpendent with initial HEP    Time  6    Period  Weeks    Status  New      PT LONG TERM GOAL #2   Title  She will have intermittant back pain and decreased 75% or more at home and work    Baseline  mod to severe pain at toimes at home and work    Time  6    Period  Weeks    Status  New      PT LONG TERM GOAL #3   Title  She will report knee pain improved 50% with getting into bed    Baseline  she climbs into bed at night and LT >RT knee is painful    Time  6    Period  Weeks    Status  New             Plan - 05/19/19 1542    Clinical Impression Statement  Ms Mchargue presents with chronic back and knee pain. She reports no injury. She demo core weakness and stiffness in back and hips with increased lordosis.  She is functional but standing and lifting activity cause incr pain . today extension felt good but her pain was mild.   She should improve with skilled PT and consistent HEP    Personal Factors and Comorbidities  Time since onset of injury/illness/exacerbation;Comorbidity 1;Age    Comorbidities  obesity    Examination-Activity Limitations  Bed Mobility;Bend;Caring for Others;Squat;Stand    Examination-Participation Restrictions  Cleaning;Community Activity    Stability/Clinical Decision Making   Stable/Uncomplicated    Clinical Decision Making  Low    Rehab Potential  Good    PT Frequency  2x / week    PT  Duration  6 weeks    PT Treatment/Interventions  Cryotherapy;Moist Heat;Iontophoresis 4mg /ml Dexamethasone;Therapeutic exercise;Therapeutic activities;Patient/family education;Manual techniques;Passive range of motion;Dry needling;Taping    PT Next Visit Plan  Manual and modalities for pain and ROM spine and hips , review HEP and add for core strength    PT Home Exercise Plan  SKC , hamstring , clam, LTR strretching    Consulted and Agree with Plan of Care  Patient       Patient will benefit from skilled therapeutic intervention in order to improve the following deficits and impairments:  Obesity, Pain, Decreased strength, Decreased activity tolerance, Increased muscle spasms, Decreased range of motion  Visit Diagnosis: Chronic pain of right knee  Chronic pain of left knee  Chronic bilateral low back pain without sciatica     Problem List Patient Active Problem List   Diagnosis Date Noted  . Edema leg 05/17/2019  . Severe obesity (BMI 35.0-39.9) with comorbidity ([place]) 04/04/2019  . Prediabetes 04/04/2019  . Seasonal allergic rhinitis due to pollen 12/14/2018  . Cough 12/14/2018  . Chronic back pain 08/23/2018  . Chronic diastolic CHF (congestive heart failure) ([place]) 06/23/2018  . OSA on CPAP 06/23/2018  . Acute ischemic right MCA stroke ([place]) 02/08/2018  . Headache 01/29/2017  . Refractive errors 04/15/2016  . Medical non-compliance 11/06/2015  . Hyperlipidemia LDL goal <70 11/06/2015  . 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 NSVT (nonsustained ventricular tachycardia) ([place]) 11/06/2015  . History of CVA (cerebrovascular accident)   . Claudication of both lower extremities ([place]) 03/22/2015  . Chronic atrial fibrillation 04/12/2014  . Pap smear for cervical cancer screening 03/28/2014  . Cigarette smoker 06/15/2013  . Ventral hernia 06/15/2013  . Chronic  anticoagulation 02/28/2013  . Hx-TIA (transient ischemic attack) 02/05/13 02/15/2013  . Essential hypertension 02/15/2013    Darrel Hoover  PT 05/19/2019, 5:24 PM  [facility] [address] [place], [place], [address] Phone: [phone]   Fax:  [phone]  Name: [name] MRN: [id number] Date of Birth: 1966-02-26

## 2019-05-19 NOTE — Patient Instructions (Signed)
RT/Lt SKC xsupine 2-3 reps 20-30 sec , hamstring stretch,  20-30 sec 2x/day , clam stretch LTR 2-3 reps 20-30 sec

## 2019-05-28 IMAGING — MG DIGITAL SCREENING BILATERAL MAMMOGRAM WITH CAD
4 series · 4 of 4 positions shown · non-contrast
Comparison: Previous exam(s).

CLINICAL DATA: Screening.

EXAM:
DIGITAL SCREENING BILATERAL MAMMOGRAM WITH CAD

[R CC]
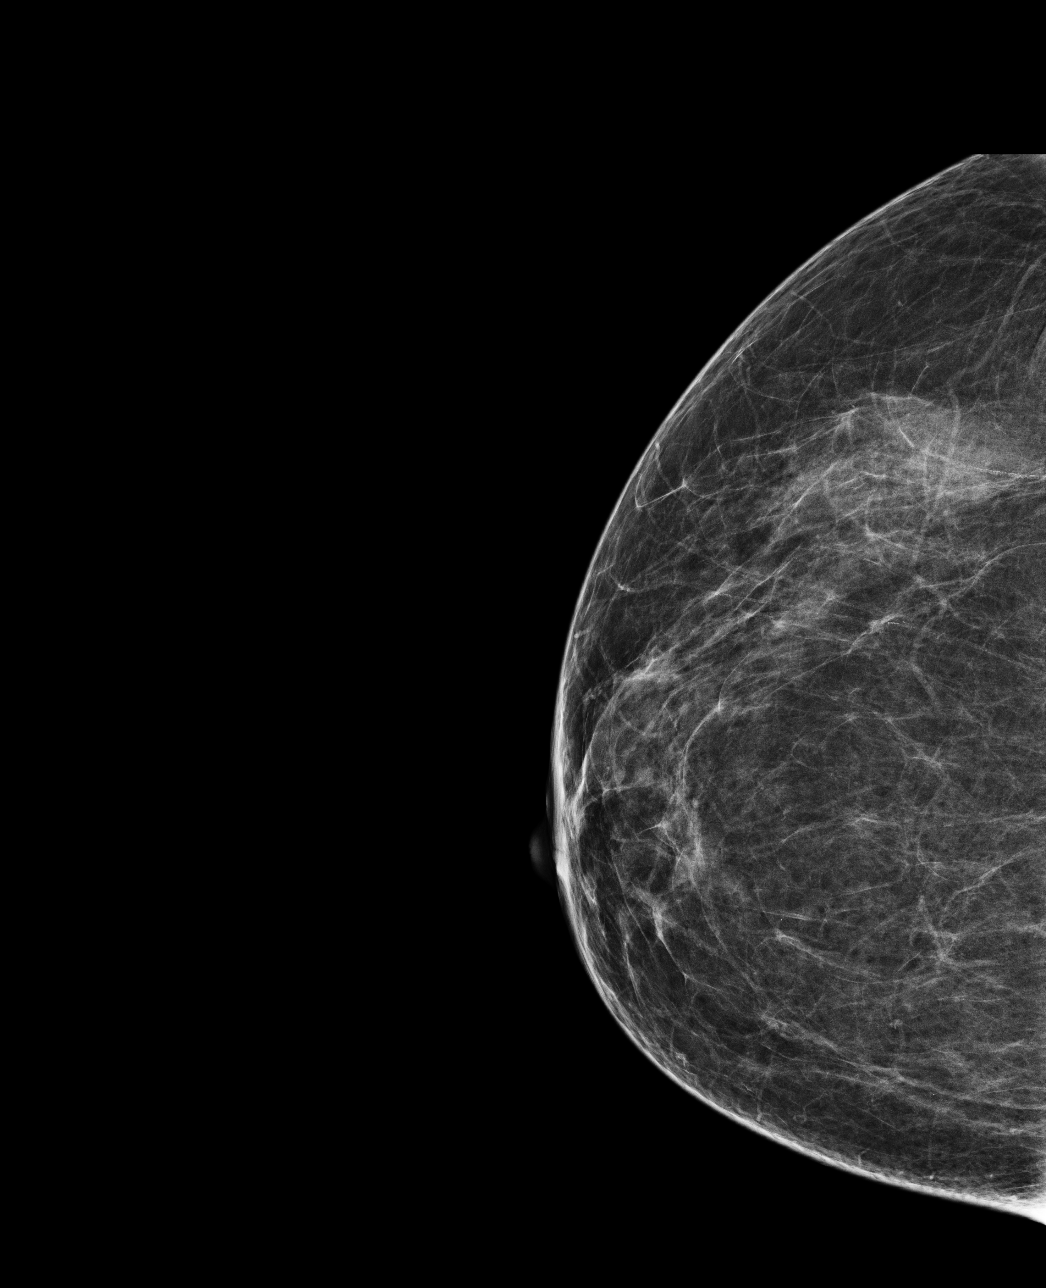

[L CC]
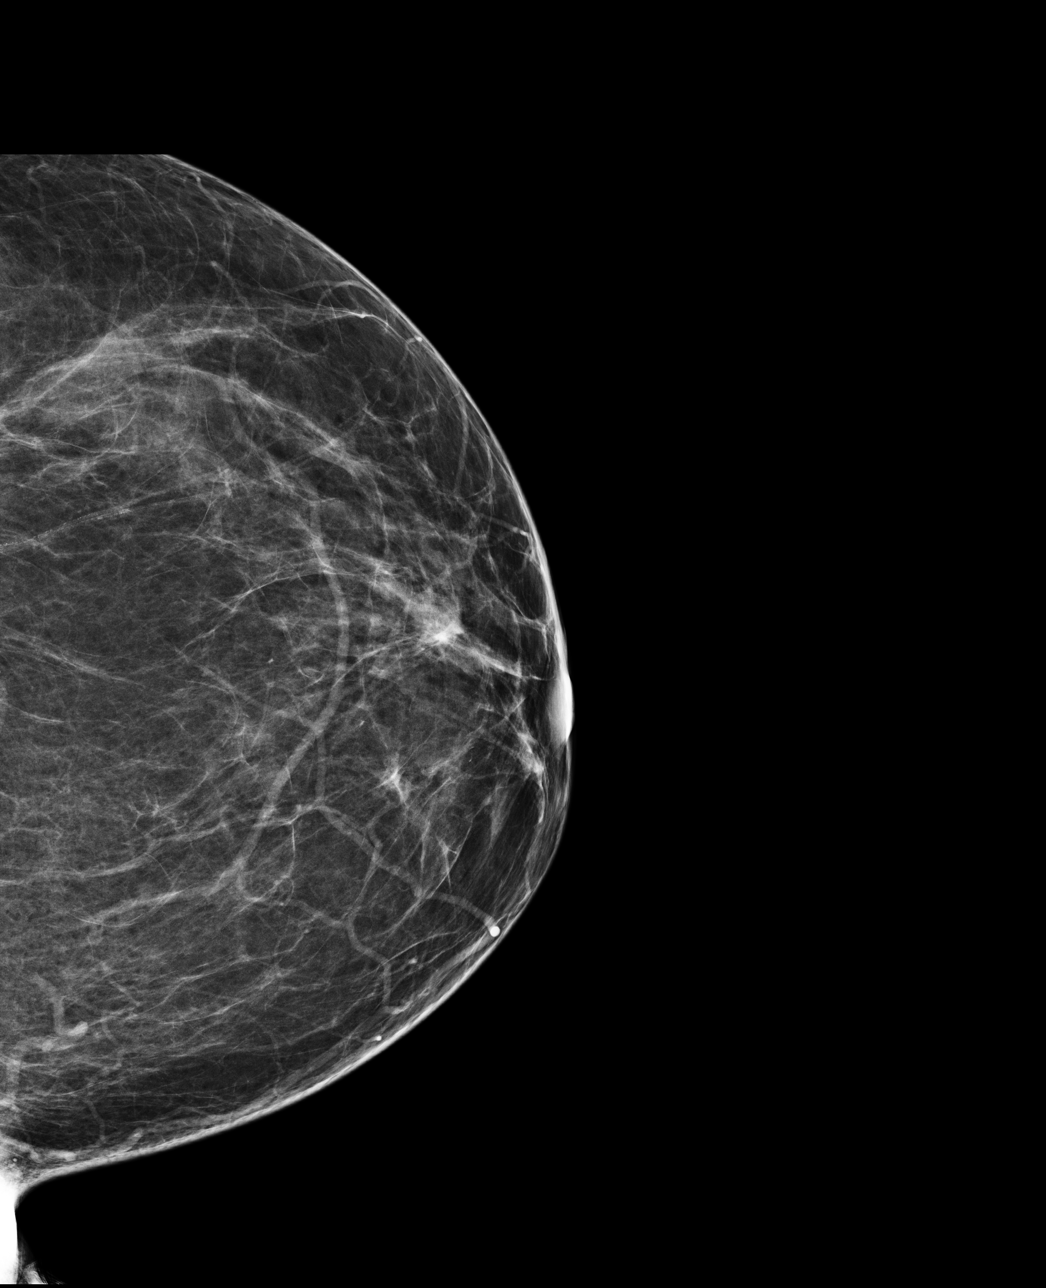

[R MLO]
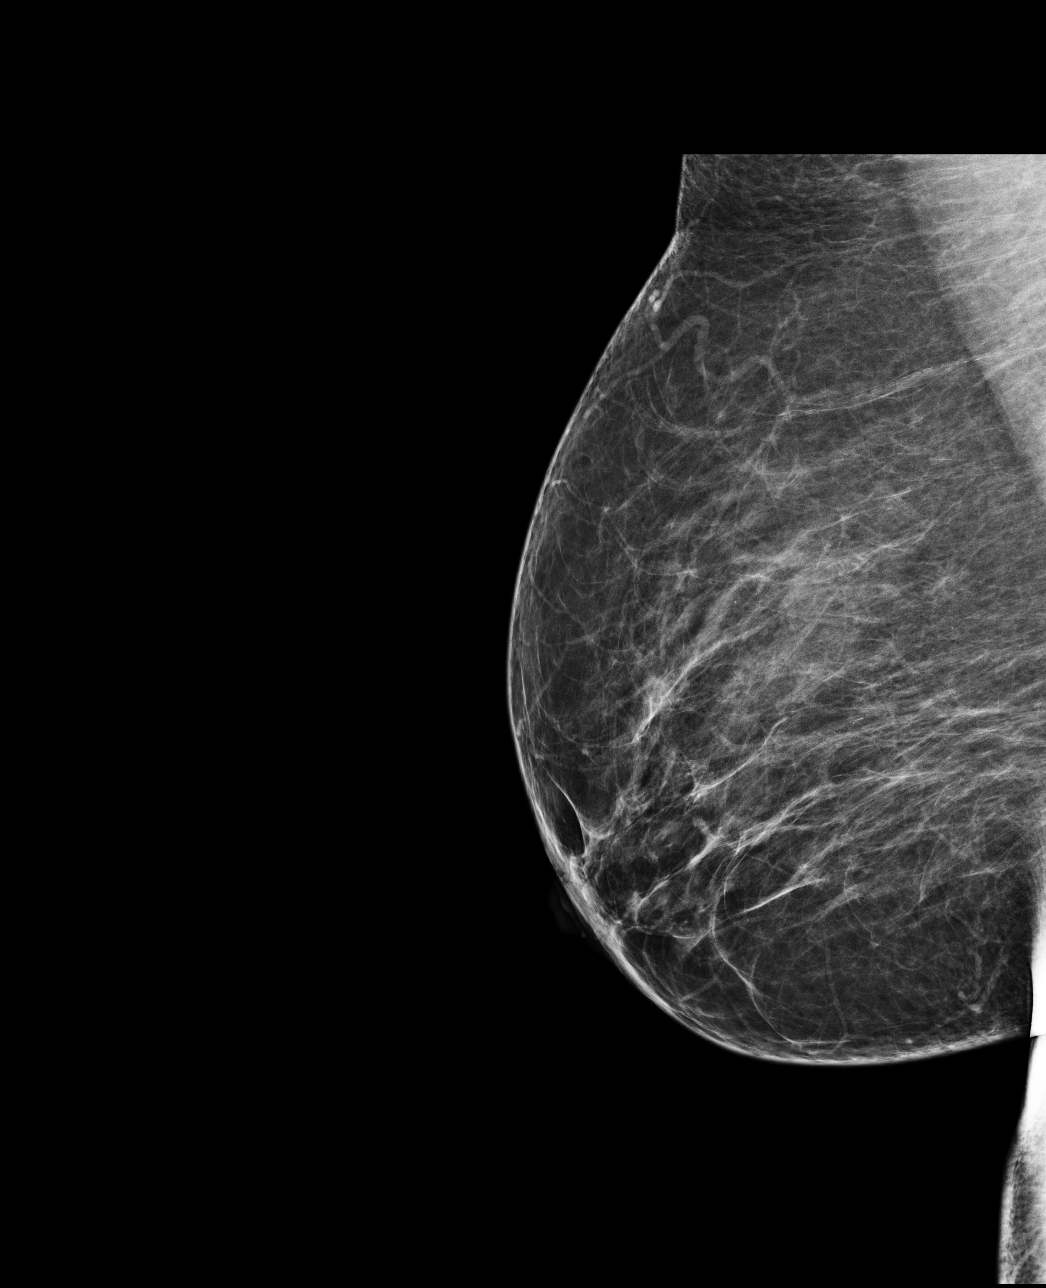

[L MLO]
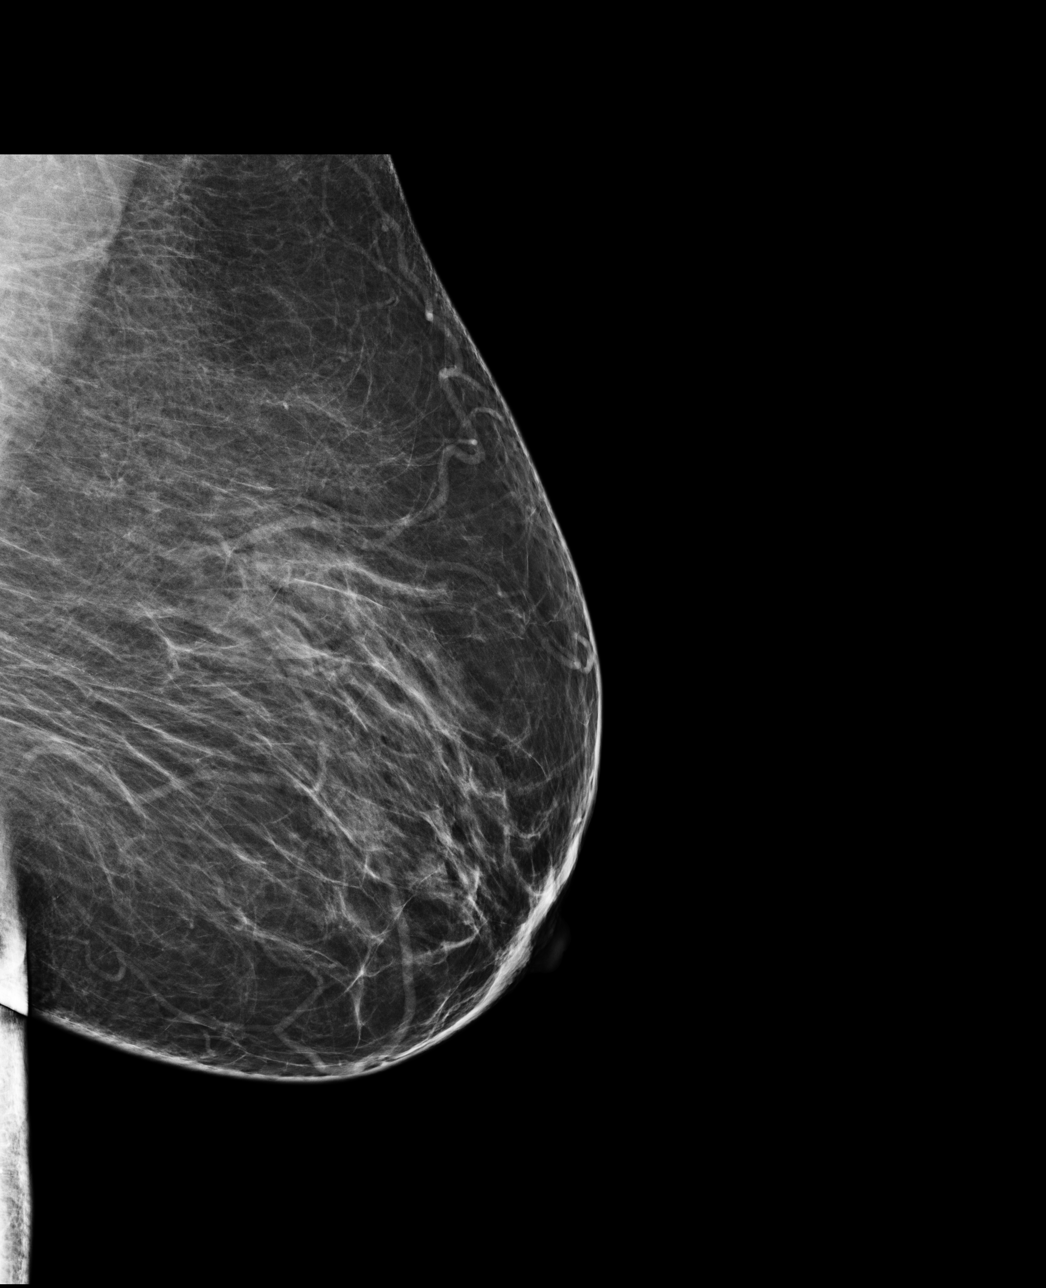

[4 of 4 positions shown; findings below may reference images not displayed]

ACR Breast Density Category b: There are scattered areas of
fibroglandular density.
FINDINGS: In the left breast, a possible asymmetry warrants further
evaluation. In the right breast, no findings suspicious for
malignancy. Images were processed with CAD.
IMPRESSION: Further evaluation is suggested for possible asymmetry in the left
breast.

RECOMMENDATION:
Diagnostic mammogram and possibly ultrasound of the left breast.
(Code:[id number])

The patient will be contacted regarding the findings, and additional
imaging will be scheduled.

BI-RADS CATEGORY  0: Incomplete. Need additional imaging evaluation
and/or prior mammograms for comparison.

## 2019-05-31 MED FILL — VIT D2 1.25 MG (50,000 UNIT: 1.25 MG | 6 days supply | Qty: 1 | Fill #2

## 2019-05-31 MED FILL — XARELTO 20 MG TABLET: 20 | 30 days supply | Qty: 30 | Fill #1

## 2019-06-06 ENCOUNTER — Ambulatory Visit: Payer: Medicaid Other

## 2019-06-07 ENCOUNTER — Ambulatory Visit: Payer: Medicaid Other | Attending: Family Medicine | Admitting: *Deleted

## 2019-06-07 ENCOUNTER — Ambulatory Visit
Admission: RE | Admit: 2019-06-07 | Discharge: 2019-06-07 | Disposition: A | Discharge status: Home / Self Care | Payer: Medicaid Other | Source: Ambulatory Visit | Attending: Family Medicine | Admitting: Family Medicine

## 2019-06-07 ENCOUNTER — Other Ambulatory Visit: Payer: Self-pay

## 2019-06-07 ENCOUNTER — Ambulatory Visit: Payer: Medicaid Other

## 2019-06-07 DIAGNOSIS — M25561 Pain in right knee: Secondary | ICD-10-CM | POA: Diagnosis not present

## 2019-06-07 DIAGNOSIS — G8929 Other chronic pain: Secondary | ICD-10-CM

## 2019-06-07 DIAGNOSIS — M2560 Stiffness of unspecified joint, not elsewhere classified: Secondary | ICD-10-CM | POA: Diagnosis not present

## 2019-06-07 DIAGNOSIS — M25562 Pain in left knee: Secondary | ICD-10-CM

## 2019-06-07 DIAGNOSIS — Z23 Encounter for immunization: Secondary | ICD-10-CM

## 2019-06-07 DIAGNOSIS — M6281 Muscle weakness (generalized): Secondary | ICD-10-CM | POA: Diagnosis not present

## 2019-06-07 DIAGNOSIS — M25651 Stiffness of right hip, not elsewhere classified: Secondary | ICD-10-CM | POA: Diagnosis not present

## 2019-06-07 DIAGNOSIS — M545 Low back pain, unspecified: Secondary | ICD-10-CM

## 2019-06-07 DIAGNOSIS — Z1231 Encounter for screening mammogram for malignant neoplasm of breast: Secondary | ICD-10-CM | POA: Diagnosis not present

## 2019-06-07 DIAGNOSIS — M25652 Stiffness of left hip, not elsewhere classified: Secondary | ICD-10-CM

## 2019-06-07 NOTE — Patient Instructions (Signed)
PPT with butt lift . Part sit up  Ab prep Pilates 5 reps    Both with exhale on lift    5 reps    daily

## 2019-06-07 NOTE — Therapy (Signed)
[place], [place], [address] Phone: [phone]   Fax:  [phone]  Physical Therapy Treatment  Patient Details  Name: [name] MRN: [id number] Date of Birth: 19-Dec-1965 Referring Provider (PT): [name], MD   Encounter Date: 06/07/2019  PT End of Session - 06/07/19 1343    Visit Number  2    Number of Visits  12    Date for PT Re-Evaluation  07/01/19    Authorization Type  MCD    PT Start Time  0145    PT Stop Time  0230    PT Time Calculation (min)  45 min    Activity Tolerance  Patient tolerated treatment well    Behavior During Therapy  [facility] for tasks assessed/performed       Past Medical History:  Diagnosis Date  . Clotting disorder ([place])    on xarelto  . Essential hypertension   . Heart murmur    a. 10/2015 Echo: EF 60-65%, no rwma, mild AI/MR, sev dil LA/RA, PASP 26mmHg.  [name] Noncompliance   . NSVT (nonsustained ventricular tachycardia) ([place])    a. 10/2015 during admission for CVA/AF.  [name] Paroxysmal A-fib ([place])    a. CHA2DS2VASC = 4-->Xarelto;  b. 02/2016 Successful DCCV.  [name] Pneumonia 2012 x 2; 2015  . Stroke [facility])    a. 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  . Transient ischemic attack (TIA) 01/2013    Past Surgical History:  Procedure Laterality Date  . CARDIOVERSION N/A 02/18/2013   Procedure: CARDIOVERSION;  Surgeon: Sanda Klein, MD;  Location: [place] ENDOSCOPY;  Service: Cardiovascular;  Laterality: N/A;  . CARDIOVERSION N/A 02/23/2013   Procedure: CARDIOVERSION;  Surgeon: Leonie Man, MD;  Location: [place];  Service: Cardiovascular;  Laterality: N/A;  BESIDE CV  . CARDIOVERSION N/A 03/12/2016   Procedure: CARDIOVERSION;  Surgeon: Larey Dresser, MD;  Location: [place];  Service: Cardiovascular;  Laterality: N/A;  . TEE WITHOUT CARDIOVERSION N/A 02/18/2013   Procedure: TRANSESOPHAGEAL  ECHOCARDIOGRAM (TEE);  Surgeon: Sanda Klein, MD;  Location: [facility] ENDOSCOPY;  Service: Cardiovascular;  Laterality: N/A;  . TUBAL LIGATION  1992    There were no vitals filed for this visit.  Subjective Assessment - 06/07/19 1351    Subjective  nothing is different. did some of the exercises.   Week plus ago  had sharp pain in chest and spoke to MD  and resolved.  Knees better as she lifts legs from sitting. more nowLAst back pain was this AM at work or house work.    Pain Score  0-No pain    Pain Location  Back    Pain Score  0    Pain Location  Knee         [facility] PT Assessment - 06/07/19 0001      AROM   Lumbar Flexion  65    Lumbar Extension  25    Lumbar - Right Side Bend  15    Lumbar - Left Side Bend  15    Lumbar - Right Rotation  60    Lumbar - Left Rotation  60      PROM   Right Hip External Rotation   45    Right Hip Internal Rotation   10    Left Hip Flexion  100    Left Hip External Rotation   45    Left Hip Internal Rotation  10                   [facility] Adult PT Treatment/Exercise - 06/07/19 0001      Posture/Postural Control   Posture Comments  LT shoulder and ilium higher than RT.       Exercises   Exercises  Lumbar      Lumbar Exercises: Stretches   Single Knee to Chest Stretch  Right;Left;2 reps;20 seconds    Lower Trunk Rotation  2 reps;30 seconds      Lumbar Exercises: Supine   Pelvic Tilt  10 reps;5 seconds    Straight Leg Raise  10 reps    Straight Leg Raises Limitations  RT/LT     Other Supine Lumbar Exercises  core stability with ;legs on ball  with hamstring set with ball squeeze , PPT with slight butt lift and attempt at exhleation  cued for technique but doordination of this was ppor with breathing , good with other aspects    Other Supine Lumbar Exercises  part sit ups with PPT and exhalation. done well              PT Education - 06/07/19 1426    Education Details  HEPO    Person(s) Educated  Patient    Methods   Explanation;Tactile cues;Verbal cues;Handout    Comprehension  Returned demonstration;Verbalized understanding       PT Short Term Goals - 06/07/19 1346      PT SHORT TERM GOAL #1   Title  She will be independent with initial HEP.    Baseline  she was able to do her initial HEP    Time  2    Period  Weeks    Status  Achieved      PT SHORT TERM GOAL #2   Title  She will report pain in back decr 20% overall    Baseline  varies from 2-8/10 depend on activity    Time  2    Period  Weeks    Status  New      PT SHORT TERM GOAL #3   Title  She will demo aware of good posture sitting and standing    Baseline  incr lordosis    Time  2    Period  Weeks    Status  New        PT Long Term Goals - 06/07/19 1346      PT LONG TERM GOAL #1   Title  She will be indpendent with all HEP issued    Baseline  indpendent with initial HEP    Time  6    Period  Weeks    Status  New      PT LONG TERM GOAL #2   Title  She will have intermittant back pain and decreased 75% or more at home and work    Baseline  mod to severe pain at toimes at home and work    Time  6    Period  Weeks    Status  New      PT LONG TERM GOAL #3   Title  She will report knee pain improved 50% with getting into bed    Baseline  she climbs into bed at night and LT >RT knee is painful    Time  6    Period  Weeks    Status  New            Plan - 06/07/19  24    Clinical Impression Statement  [name] has not returned until today and she is out of authorization period.  She was able to demo HEP and do new ones without pain.  She needs progression of HEP for ROM and core strength. Will request another auth for first 3 session and hopefully she will attend and make some progress for extension after first 2 sessions    Personal Factors and Comorbidities  Time since onset of injury/illness/exacerbation;Comorbidity 1;Age    Comorbidities  obesity    Examination-Activity Limitations  Bed Mobility;Bend;Caring for  Others;Squat;Stand    Examination-Participation Restrictions  Cleaning;Community Activity    Stability/Clinical Decision Making  Stable/Uncomplicated    Clinical Decision Making  Low    Rehab Potential  Good    PT Frequency  --   3 visits   PT Duration  2 weeks    PT Treatment/Interventions  Cryotherapy;Moist Heat;Iontophoresis 4mg /ml Dexamethasone;Therapeutic exercise;Therapeutic activities;Patient/family education;Manual techniques;Passive range of motion;Dry needling;Taping    PT Next Visit Plan  Manual and modalities for pain and ROM spine and hips , review HEP and add for core strength    PT Home Exercise Plan  SKC , hamstring , clam, LTR strretching    Consulted and Agree with Plan of Care  Patient       Patient will benefit from skilled therapeutic intervention in order to improve the following deficits and impairments:  Obesity, Pain, Decreased strength, Decreased activity tolerance, Increased muscle spasms, Decreased range of motion  Visit Diagnosis: Chronic pain of left knee  Chronic pain of right knee  Chronic bilateral low back pain without sciatica  Muscle weakness (generalized)  Joint stiffness of spine  Stiffness of right hip, not elsewhere classified  Stiffness of left hip, not elsewhere classified     Problem List Patient Active Problem List   Diagnosis Date Noted  . Edema leg 05/17/2019  . Severe obesity (BMI 35.0-39.9) with comorbidity ([place]) 04/04/2019  . Prediabetes 04/04/2019  . Seasonal allergic rhinitis due to pollen 12/14/2018  . Cough 12/14/2018  . Chronic back pain 08/23/2018  . Chronic diastolic CHF (congestive heart failure) ([place]) 06/23/2018  . OSA on CPAP 06/23/2018  . Acute ischemic right MCA stroke ([place]) 02/08/2018  . Headache 01/29/2017  . Refractive errors 04/15/2016  . Medical non-compliance 11/06/2015  . Hyperlipidemia LDL goal <70 11/06/2015  . 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 NSVT (nonsustained ventricular tachycardia)  ([place]) 11/06/2015  . History of CVA (cerebrovascular accident)   . Claudication of both lower extremities ([place]) 03/22/2015  . Chronic atrial fibrillation 04/12/2014  . Pap smear for cervical cancer screening 03/28/2014  . Cigarette smoker 06/15/2013  . Ventral hernia 06/15/2013  . Chronic anticoagulation 02/28/2013  . Hx-TIA (transient ischemic attack) 02/05/13 02/15/2013  . Essential hypertension 02/15/2013    Darrel Hoover  PT 06/07/2019, 2:34 PM  [facility] [address] [place], [place], [address] Phone: [phone]   Fax:  [phone]  Name: [name] MRN: [id number] Date of Birth: [birth year]

## 2019-06-09 ENCOUNTER — Other Ambulatory Visit: Payer: Self-pay

## 2019-06-09 ENCOUNTER — Ambulatory Visit: Payer: Medicaid Other | Attending: Family Medicine

## 2019-06-09 DIAGNOSIS — Z111 Encounter for screening for respiratory tuberculosis: Secondary | ICD-10-CM | POA: Diagnosis not present

## 2019-06-09 NOTE — Progress Notes (Signed)
Patient came in today to get her PPD reading.  PPD was placed on her left arm.  No swelling or irritation.  Negative for tuberculosis.   A letter was given to patient.

## 2019-06-13 ENCOUNTER — Ambulatory Visit: Payer: Medicaid Other

## 2019-06-16 DIAGNOSIS — R5383 Other fatigue: Secondary | ICD-10-CM | POA: Diagnosis not present

## 2019-06-16 DIAGNOSIS — R0683 Snoring: Secondary | ICD-10-CM | POA: Diagnosis not present

## 2019-06-16 DIAGNOSIS — G4733 Obstructive sleep apnea (adult) (pediatric): Secondary | ICD-10-CM | POA: Diagnosis not present

## 2019-06-20 ENCOUNTER — Telehealth: Payer: Self-pay

## 2019-06-20 ENCOUNTER — Other Ambulatory Visit: Payer: Self-pay | Admitting: Family Medicine

## 2019-06-20 MED ORDER — VITAMIN D (ERGOCALCIFEROL) 1.25 MG (50000 UNIT) PO CAPS: ORAL_CAPSULE | ORAL | 0 refills | Status: DC

## 2019-06-20 MED FILL — CARTIA XT 300 MG CAPSULE SA: 300 | 30 days supply | Qty: 30 | Fill #1

## 2019-06-20 MED FILL — metFORMIN HCL 500 MG TABS: 500 | 30 days supply | Qty: 60 | Fill #1

## 2019-06-20 NOTE — Telephone Encounter (Signed)
Refilled

## 2019-06-20 NOTE — Telephone Encounter (Signed)
Patient is requesting a refill on VIT D sent to onsite Pharmacy.

## 2019-06-21 ENCOUNTER — Ambulatory Visit: Payer: Medicaid Other | Attending: Family Medicine

## 2019-06-21 ENCOUNTER — Other Ambulatory Visit: Payer: Self-pay

## 2019-06-21 DIAGNOSIS — M6281 Muscle weakness (generalized): Secondary | ICD-10-CM | POA: Diagnosis not present

## 2019-06-21 DIAGNOSIS — M545 Low back pain: Secondary | ICD-10-CM | POA: Diagnosis not present

## 2019-06-21 DIAGNOSIS — M25562 Pain in left knee: Secondary | ICD-10-CM | POA: Diagnosis not present

## 2019-06-21 DIAGNOSIS — G8929 Other chronic pain: Secondary | ICD-10-CM | POA: Diagnosis not present

## 2019-06-21 DIAGNOSIS — M25561 Pain in right knee: Secondary | ICD-10-CM | POA: Insufficient documentation

## 2019-06-21 MED FILL — VIT D2 1.25 MG (50,000 UNIT: 1.25 MG | 56 days supply | Qty: 9 | Fill #0

## 2019-06-21 NOTE — Therapy (Signed)
[place], [place], [address] Phone: [phone]   Fax:  [phone]  Physical Therapy Treatment  Patient Details  Name: [name] MRN: [id number] Date of Birth: 04-18-1966 Referring Provider (PT): [name], MD   Encounter Date: 06/21/2019  PT End of Session - 06/21/19 1447    Visit Number  3    Number of Visits  12    Date for PT Re-Evaluation  07/01/19    Authorization Type  MCD    Authorization Time Period  4 visit fronm 10-5 to 09/15/18    Authorization - Visit Number  1    Authorization - Number of Visits  4    PT Start Time  0200    PT Stop Time  0240    PT Time Calculation (min)  40 min    Activity Tolerance  Patient tolerated treatment well    Behavior During Therapy  [facility] for tasks assessed/performed       Past Medical History:  Diagnosis Date  . Clotting disorder ([place])    on xarelto  . Essential hypertension   . Heart murmur    a. 10/2015 Echo: EF 60-65%, no rwma, mild AI/MR, sev dil LA/RA, PASP 15mmHg.  [name] Noncompliance   . NSVT (nonsustained ventricular tachycardia) ([place])    a. 10/2015 during admission for CVA/AF.  [name] Paroxysmal A-fib ([place])    a. CHA2DS2VASC = 4-->Xarelto;  b. 02/2016 Successful DCCV.  [name] Pneumonia 2012 x 2; 2015  . Stroke [facility])    a. 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  . Transient ischemic attack (TIA) 01/2013    Past Surgical History:  Procedure Laterality Date  . CARDIOVERSION N/A 02/18/2013   Procedure: CARDIOVERSION;  Surgeon: Sanda Klein, MD;  Location: [place] ENDOSCOPY;  Service: Cardiovascular;  Laterality: N/A;  . CARDIOVERSION N/A 02/23/2013   Procedure: CARDIOVERSION;  Surgeon: Leonie Man, MD;  Location: [place];  Service: Cardiovascular;  Laterality: N/A;  BESIDE CV  . CARDIOVERSION N/A 03/12/2016   Procedure: CARDIOVERSION;  Surgeon: Larey Dresser, MD;   Location: [place];  Service: Cardiovascular;  Laterality: N/A;  . TEE WITHOUT CARDIOVERSION N/A 02/18/2013   Procedure: TRANSESOPHAGEAL ECHOCARDIOGRAM (TEE);  Surgeon: Sanda Klein, MD;  Location: [facility] ENDOSCOPY;  Service: Cardiovascular;  Laterality: N/A;  . TUBAL LIGATION  1992    There were no vitals filed for this visit.  Subjective Assessment - 06/21/19 1410    Subjective  Pain with work this AM but no pain now.    Currently in Pain?  No/denies                       [facility] Adult PT Treatment/Exercise - 06/21/19 0001      Lumbar Exercises: Stretches   Lower Trunk Rotation  2 reps;30 seconds      Lumbar Exercises: Standing   Other Standing Lumbar Exercises  red band pulls extension and row  x 15      Lumbar Exercises: Supine   Pelvic Tilt  10 reps;5 seconds    Bent Knee Raise  10 reps    Bent Knee Raise Limitations  RT/LT  5 sec each lift to lower    Bridge  10 reps    Bridge Limitations  With PPT    Other Supine Lumbar Exercises  part sit ups with PPT and exhalation. x10  Lumbar Exercises: Sidelying   Clam  Right;Left;10 reps    Clam Limitations  red band    Hip Abduction  Right;Left;10 reps    Hip Abduction Weights (lbs)  red band             PT Education - 06/21/19 1444    Education Details  BAnd exer for home    Person(s) Educated  Patient    Methods  Explanation;Demonstration;Verbal cues;Handout    Comprehension  Returned demonstration;Verbalized understanding       PT Short Term Goals - 06/07/19 1346      PT SHORT TERM GOAL #1   Title  She will be independent with initial HEP.    Baseline  she was able to do her initial HEP    Time  2    Period  Weeks    Status  Achieved      PT SHORT TERM GOAL #2   Title  She will report pain in back decr 20% overall    Baseline  varies from 2-8/10 depend on activity    Time  2    Period  Weeks    Status  New      PT SHORT TERM GOAL #3   Title  She will demo aware of good posture  sitting and standing    Baseline  incr lordosis    Time  2    Period  Weeks    Status  New        PT Long Term Goals - 06/07/19 1346      PT LONG TERM GOAL #1   Title  She will be indpendent with all HEP issued    Baseline  indpendent with initial HEP    Time  6    Period  Weeks    Status  New      PT LONG TERM GOAL #2   Title  She will have intermittant back pain and decreased 75% or more at home and work    Baseline  mod to severe pain at toimes at home and work    Time  6    Period  Weeks    Status  New      PT LONG TERM GOAL #3   Title  She will report knee pain improved 50% with getting into bed    Baseline  she climbs into bed at night and LT >RT knee is painful    Time  6    Period  Weeks    Status  New            Plan - 06/21/19 1447    Clinical Impression Statement  She di well with all exercise  no pain post.  issued HEP with bands,  progress HEP next visit    PT Treatment/Interventions  Cryotherapy;Moist Heat;Iontophoresis 4mg /ml Dexamethasone;Therapeutic exercise;Therapeutic activities;Patient/family education;Manual techniques;Passive range of motion;Dry needling;Taping    PT Next Visit Plan  Manual and modalities for pain and ROM spine and hips , review HEP and add for core strength    PT Home Exercise Plan  SKC , hamstring , clam, LTR strretching   , Band side clams and hip abduciton , bridge, row and shoulder ext  red band    Consulted and Agree with Plan of Care  Patient       Patient will benefit from skilled therapeutic intervention in order to improve the following deficits and impairments:  Obesity, Pain, Decreased strength, Decreased activity tolerance, Increased muscle spasms,  Decreased range of motion  Visit Diagnosis: Chronic pain of right knee  Chronic pain of left knee  Chronic bilateral low back pain without sciatica  Muscle weakness (generalized)     Problem List Patient Active Problem List   Diagnosis Date Noted  . Edema  leg 05/17/2019  . Severe obesity (BMI 35.0-39.9) with comorbidity ([place]) 04/04/2019  . Prediabetes 04/04/2019  . Seasonal allergic rhinitis due to pollen 12/14/2018  . Cough 12/14/2018  . Chronic back pain 08/23/2018  . Chronic diastolic CHF (congestive heart failure) ([place]) 06/23/2018  . OSA on CPAP 06/23/2018  . Acute ischemic right MCA stroke ([place]) 02/08/2018  . Headache 01/29/2017  . Refractive errors 04/15/2016  . Medical non-compliance 11/06/2015  . Hyperlipidemia LDL goal <70 11/06/2015  . 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 NSVT (nonsustained ventricular tachycardia) ([place]) 11/06/2015  . History of CVA (cerebrovascular accident)   . Claudication of both lower extremities ([place]) 03/22/2015  . Chronic atrial fibrillation ([place]) 04/12/2014  . Pap smear for cervical cancer screening 03/28/2014  . Cigarette smoker 06/15/2013  . Ventral hernia 06/15/2013  . Chronic anticoagulation 02/28/2013  . Hx-TIA (transient ischemic attack) 02/05/13 02/15/2013  . Essential hypertension 02/15/2013    Darrel Hoover  PT                    06/21/2019, 2:49 PM  [facility] [address] [place], [place], [address] Phone: [phone]   Fax:  [phone]  Name: YUNA GOLIAS MRN: [id number] Date of Birth: 04-28-1966

## 2019-06-21 NOTE — Patient Instructions (Signed)
Band row and shoulder extension x 10-20 reps  1-2x/day,      Band sid eclam and hip abduciton x 10-20  1-2x/day

## 2019-06-23 ENCOUNTER — Ambulatory Visit: Payer: Medicaid Other

## 2019-06-28 ENCOUNTER — Ambulatory Visit: Payer: Medicaid Other

## 2019-06-28 ENCOUNTER — Other Ambulatory Visit: Payer: Self-pay

## 2019-06-28 DIAGNOSIS — M25561 Pain in right knee: Secondary | ICD-10-CM | POA: Diagnosis not present

## 2019-06-28 DIAGNOSIS — M25562 Pain in left knee: Secondary | ICD-10-CM

## 2019-06-28 DIAGNOSIS — M6281 Muscle weakness (generalized): Secondary | ICD-10-CM | POA: Diagnosis not present

## 2019-06-28 DIAGNOSIS — G8929 Other chronic pain: Secondary | ICD-10-CM | POA: Diagnosis not present

## 2019-06-28 DIAGNOSIS — M545 Low back pain: Secondary | ICD-10-CM | POA: Diagnosis not present

## 2019-06-28 MED FILL — XARELTO 20 MG TABLET: 20 | 30 days supply | Qty: 30 | Fill #2

## 2019-06-28 MED FILL — FLUTICASONE PROP 50 MCG SPR: 50 | 30 days supply | Qty: 16 | Fill #2

## 2019-06-28 MED FILL — ACCU-CHEK FASTCLIX LANCETS: 30 days supply | Qty: 102 | Fill #1

## 2019-06-28 MED FILL — ALBUTEROL SULFATE HFA 108 (: 108 (90 BAS | 25 days supply | Qty: 18 | Fill #2

## 2019-06-28 MED FILL — METHOCARBAMOL 500 MG TABS: 500 | 30 days supply | Qty: 90 | Fill #1

## 2019-06-28 MED FILL — GABAPENTIN 300 MG CAPSULE: 300 | 30 days supply | Qty: 60 | Fill #2

## 2019-06-28 MED FILL — ACCU-CHEK GUIDE TEST STRIP: 30 days supply | Qty: 100 | Fill #1

## 2019-06-28 NOTE — Therapy (Signed)
[place], [place], [address] Phone: [phone]   Fax:  [phone]  Physical Therapy Treatment  Patient Details  Name: [name] MRN: [id number] Date of Birth: 04-15-1966 Referring Provider (PT): [name], MD   Encounter Date: 06/28/2019  PT End of Session - 06/28/19 1408    Visit Number  4    Number of Visits  12    Date for PT Re-Evaluation  07/01/19    Authorization Type  MCD    Authorization Time Period  4 visit fronm 10-5 to 09/15/18    Authorization - Visit Number  2    Authorization - Number of Visits  4    PT Start Time  0209   late   PT Stop Time  0247    PT Time Calculation (min)  38 min    Activity Tolerance  Patient tolerated treatment well    Behavior During Therapy  [facility] for tasks assessed/performed       Past Medical History:  Diagnosis Date  . Clotting disorder ([place])    on xarelto  . Essential hypertension   . Heart murmur    a. 10/2015 Echo: EF 60-65%, no rwma, mild AI/MR, sev dil LA/RA, PASP 36mmHg.  [name] Noncompliance   . NSVT (nonsustained ventricular tachycardia) ([place])    a. 10/2015 during admission for CVA/AF.  [name] Paroxysmal A-fib ([place])    a. CHA2DS2VASC = 4-->Xarelto;  b. 02/2016 Successful DCCV.  [name] Pneumonia 2012 x 2; 2015  . Stroke [facility])    a. 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  . Transient ischemic attack (TIA) 01/2013    Past Surgical History:  Procedure Laterality Date  . CARDIOVERSION N/A 02/18/2013   Procedure: CARDIOVERSION;  Surgeon: Sanda Klein, MD;  Location: [place] ENDOSCOPY;  Service: Cardiovascular;  Laterality: N/A;  . CARDIOVERSION N/A 02/23/2013   Procedure: CARDIOVERSION;  Surgeon: Leonie Man, MD;  Location: [place];  Service: Cardiovascular;  Laterality: N/A;  BESIDE CV  . CARDIOVERSION N/A 03/12/2016   Procedure: CARDIOVERSION;  Surgeon: Larey Dresser, MD;   Location: [place];  Service: Cardiovascular;  Laterality: N/A;  . TEE WITHOUT CARDIOVERSION N/A 02/18/2013   Procedure: TRANSESOPHAGEAL ECHOCARDIOGRAM (TEE);  Surgeon: Sanda Klein, MD;  Location: [facility]OSCOPY;  Service: Cardiovascular;  Laterality: N/A;  . TUBAL LIGATION  1992    There were no vitals filed for this visit.  Subjective Assessment - 06/28/19 1409    Subjective  no pain today.    Currently in Pain?  No/denies                       [facility] Adult PT Treatment/Exercise - 06/28/19 0001      Lumbar Exercises: Stretches   Lower Trunk Rotation  5 reps    Lower Trunk Rotation Limitations  RT/LT       Lumbar Exercises: Standing   Other Standing Lumbar Exercises  Blue band pulldown, rows, cross body pulls x 15        Lumbar Exercises: Seated   Sit to Stand  15 reps    Sit to Stand Limitations  from  elevated seat      Lumbar Exercises: Supine   Pelvic Tilt  10 reps;5 seconds    Bent Knee Raise  15 reps    Bent Knee Raise Limitations  RT/Lt  leg extesnion then flexion then foot back to  table     Bridge  15 reps    Bridge Limitations  With PPT    Other Supine Lumbar Exercises  PPT with green band clams x 12 reps    Other Supine Lumbar Exercises  part sit ups with PPT and exhalation. x 15      Lumbar Exercises: Sidelying   Clam  Right;Left;15 reps    Clam Limitations  reen band      Lumbar Exercises: Prone   Straight Leg Raise  15 reps    Straight Leg Raises Limitations  RT/LT                PT Short Term Goals - 06/28/19 1409      PT SHORT TERM GOAL #1   Title  She will be independent with initial HEP.    Baseline  she was able to do her initial HEP    Status  Achieved      PT SHORT TERM GOAL #2   Title  She will report pain in back decr 10% overall    Status  On-going      PT SHORT TERM GOAL #3   Title  She will demo aware of good posture sitting and standing    Status  Achieved        PT Long Term Goals - 06/28/19 1410       PT LONG TERM GOAL #1   Title  She will be indpendent with all HEP issued    Status  On-going      PT LONG TERM GOAL #2   Title  She will have intermittant back pain and decreased 75% or more at home and work    Baseline  10% better.   Pain at work still can be high but generally stops when task is over.    Status  On-going      PT LONG TERM GOAL #3   Title  She will report knee pain improved 50% with getting into bed    Baseline  stopped climbing into bed    Status  On-going            Plan - 06/28/19 1526    Clinical Impression Statement  No complaints and she tolerated increased load with exercises with blue band.   Continue to progress Exercise.    PT Treatment/Interventions  Cryotherapy;Moist Heat;Iontophoresis 4mg /ml Dexamethasone;Therapeutic exercise;Therapeutic activities;Patient/family education;Manual techniques;Passive range of motion;Dry needling;Taping    PT Next Visit Plan  Manual and modalities for pain and ROM spine and hips , review HEP and add for core strength    PT Home Exercise Plan  SKC , hamstring , clam, LTR strretching   , Band side clams and hip abduciton , bridge, row and shoulder ext  red band,  cross body pulls ,sit to stand    Consulted and Agree with Plan of Care  Patient       Patient will benefit from skilled therapeutic intervention in order to improve the following deficits and impairments:  Obesity, Pain, Decreased strength, Decreased activity tolerance, Increased muscle spasms, Decreased range of motion  Visit Diagnosis: Chronic pain of right knee  Chronic pain of left knee  Chronic bilateral low back pain without sciatica  Muscle weakness (generalized)     Problem List Patient Active Problem List   Diagnosis Date Noted  . Edema leg 05/17/2019  . Severe obesity (BMI 35.0-39.9) with comorbidity ([place]) 04/04/2019  . Prediabetes 04/04/2019  . Seasonal allergic rhinitis due to pollen  12/14/2018  . Cough 12/14/2018  . Chronic back  pain 08/23/2018  . Chronic diastolic CHF (congestive heart failure) ([place]) 06/23/2018  . OSA on CPAP 06/23/2018  . Acute ischemic right MCA stroke ([place]) 02/08/2018  . Headache 01/29/2017  . Refractive errors 04/15/2016  . Medical non-compliance 11/06/2015  . Hyperlipidemia LDL goal <70 11/06/2015  . 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 NSVT (nonsustained ventricular tachycardia) ([place]) 11/06/2015  . History of CVA (cerebrovascular accident)   . Claudication of both lower extremities ([place]) 03/22/2015  . Chronic atrial fibrillation ([place]) 04/12/2014  . Pap smear for cervical cancer screening 03/28/2014  . Cigarette smoker 06/15/2013  . Ventral hernia 06/15/2013  . Chronic anticoagulation 02/28/2013  . Hx-TIA (transient ischemic attack) 02/05/13 02/15/2013  . Essential hypertension 02/15/2013    Darrel Hoover PT 06/28/2019, 3:30 PM  [facility] [address] [place], [place], [address] Phone: [phone]   Fax:  [phone]  Name: [name] MRN: [id number] Date of Birth: 22-Apr-1966

## 2019-06-30 DIAGNOSIS — I69898 Other sequelae of other cerebrovascular disease: Secondary | ICD-10-CM | POA: Diagnosis not present

## 2019-06-30 DIAGNOSIS — H0102B Squamous blepharitis left eye, upper and lower eyelids: Secondary | ICD-10-CM | POA: Diagnosis not present

## 2019-06-30 DIAGNOSIS — H0102A Squamous blepharitis right eye, upper and lower eyelids: Secondary | ICD-10-CM | POA: Diagnosis not present

## 2019-07-05 ENCOUNTER — Ambulatory Visit: Payer: Medicaid Other

## 2019-07-05 ENCOUNTER — Other Ambulatory Visit: Payer: Self-pay

## 2019-07-05 DIAGNOSIS — G8929 Other chronic pain: Secondary | ICD-10-CM | POA: Diagnosis not present

## 2019-07-05 DIAGNOSIS — M545 Low back pain: Secondary | ICD-10-CM | POA: Diagnosis not present

## 2019-07-05 DIAGNOSIS — M25562 Pain in left knee: Secondary | ICD-10-CM | POA: Diagnosis not present

## 2019-07-05 DIAGNOSIS — M6281 Muscle weakness (generalized): Secondary | ICD-10-CM | POA: Diagnosis not present

## 2019-07-05 DIAGNOSIS — M25561 Pain in right knee: Secondary | ICD-10-CM | POA: Diagnosis not present

## 2019-07-05 NOTE — Therapy (Signed)
[place], [place], [address] Phone: [phone]   Fax:  [phone]  Physical Therapy Treatment  Patient Details  Name: [name] MRN: [id number] Date of Birth: 04-03-1966 Referring Provider (PT): [name], MD   Encounter Date: 07/05/2019  PT End of Session - 07/05/19 1302    Visit Number  5    Number of Visits  12    Date for PT Re-Evaluation  07/17/19    Authorization Type  MCD    Authorization Time Period  4 visit fronm 10-5 to 07/17/19    Authorization - Visit Number  3    Authorization - Number of Visits  4    PT Start Time  [id number]    PT Stop Time  1300    PT Time Calculation (min)  40 min    Activity Tolerance  Patient tolerated treatment well    Behavior During Therapy  [facility] for tasks assessed/performed       Past Medical History:  Diagnosis Date  . Clotting disorder ([place])    on xarelto  . Essential hypertension   . Heart murmur    a. 10/2015 Echo: EF 60-65%, no rwma, mild AI/MR, sev dil LA/RA, PASP 7mmHg.  [name] Noncompliance   . NSVT (nonsustained ventricular tachycardia) ([place])    a. 10/2015 during admission for CVA/AF.  [name] Paroxysmal A-fib ([place])    a. CHA2DS2VASC = 4-->Xarelto;  b. 02/2016 Successful DCCV.  [name] Pneumonia 2012 x 2; 2015  . Stroke [facility])    a. 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  . Transient ischemic attack (TIA) 01/2013    Past Surgical History:  Procedure Laterality Date  . CARDIOVERSION N/A 02/18/2013   Procedure: CARDIOVERSION;  Surgeon: Sanda Klein, MD;  Location: [place] [facility]OSCOPY;  Service: Cardiovascular;  Laterality: N/A;  . CARDIOVERSION N/A 02/23/2013   Procedure: CARDIOVERSION;  Surgeon: Leonie Man, MD;  Location: [place];  Service: Cardiovascular;  Laterality: N/A;  BESIDE CV  . CARDIOVERSION N/A 03/12/2016   Procedure: CARDIOVERSION;  Surgeon: Larey Dresser, MD;   Location: [place];  Service: Cardiovascular;  Laterality: N/A;  . TEE WITHOUT CARDIOVERSION N/A 02/18/2013   Procedure: TRANSESOPHAGEAL ECHOCARDIOGRAM (TEE);  Surgeon: Sanda Klein, MD;  Location[facility];  Service: Cardiovascular;  Laterality: N/A;  . TUBAL LIGATION  1992    There were no vitals filed for this visit.  Subjective Assessment - 07/05/19 1224    Subjective  No pain now    Currently in Pain?  No/denies                       [facility] Adult PT Treatment/Exercise - 07/05/19 0001      Lumbar Exercises: Stretches   Lower Trunk Rotation Limitations  RT/LT  x 10 reps    Other Lumbar Stretch Exercise  side bend lumbar stretching x 5 RT/LT       Lumbar Exercises: Standing   Other Standing Lumbar Exercises  Blue band pulldown, rows, cross body pulls x 15        Lumbar Exercises: Seated   Sit to Stand     Sit to Stand Limitations  from  elevated seat 3/x10 rep     Lumbar Exercises: Supine   Pelvic Tilt  15 reps;5 seconds    Bent Knee Raise  20 reps    Bridge  20 reps    Other  Supine Lumbar Exercises  PPT with green band clams x 15 reps    Other Supine Lumbar Exercises  part sit ups with PPT and exhalation. x 15      Lumbar Exercises: Sidelying   Clam  Right;Left;15 reps    Clam Limitations  blue band      Lumbar Exercises: Prone   Straight Leg Raise  15 reps    Straight Leg Raises Limitations  RT/LT                PT Short Term Goals - 07/05/19 1305      PT SHORT TERM GOAL #1   Title  She will be independent with initial HEP.    Status  Achieved      PT SHORT TERM GOAL #2   Title  She will report pain in back decr 10% overall    Status  Achieved      PT SHORT TERM GOAL #3   Title  She will demo aware of good posture sitting and standing    Status  Achieved        PT Long Term Goals - 07/05/19 1306      PT LONG TERM GOAL #1   Title  She will be indpendent with all HEP issued    Time  6    Period  Weeks    Status   On-going      PT LONG TERM GOAL #2   Title  She will have intermittant back pain and decreased 75% or more at home and work    Baseline  10% better.   Pain at work still can be high but generally stops when task is over.    Status  On-going      PT LONG TERM GOAL #3   Title  She will report knee pain improved 50% with getting into bed    Status  Achieved            Plan - 07/05/19 1304    Clinical Impression Statement  She reported fatigue at end but no pain.   Will continue to progress exercise as tolerated.  She cont with pain at work that generally eases off after she completes the tasks    PT Treatment/Interventions  Cryotherapy;Moist Heat;Iontophoresis 4mg /ml Dexamethasone;Therapeutic exercise;Therapeutic activities;Patient/family education;Manual techniques;Passive range of motion;Dry needling;Taping    PT Next Visit Plan  Manual and modalities for pain and ROM spine and hips , review HEP and add for core strength    PT Home Exercise Plan  SKC , hamstring , clam, LTR strretching   , Band side clams and hip abduciton , bridge, row and shoulder ext  red band,  cross body pulls ,sit to stand    Consulted and Agree with Plan of Care  Patient       Patient will benefit from skilled therapeutic intervention in order to improve the following deficits and impairments:  Obesity, Pain, Decreased strength, Decreased activity tolerance, Increased muscle spasms, Decreased range of motion  Visit Diagnosis: Chronic bilateral low back pain without sciatica  Muscle weakness (generalized)     Problem List Patient Active Problem List   Diagnosis Date Noted  . Edema leg 05/17/2019  . Severe obesity (BMI 35.0-39.9) with comorbidity ([place]) 04/04/2019  . Prediabetes 04/04/2019  . Seasonal allergic rhinitis due to pollen 12/14/2018  . Cough 12/14/2018  . Chronic back pain 08/23/2018  . Chronic diastolic CHF (congestive heart failure) ([place]) 06/23/2018  . OSA on CPAP 06/23/2018  .  Acute  ischemic right MCA stroke ([place]) 02/08/2018  . Headache 01/29/2017  . Refractive errors 04/15/2016  . Medical non-compliance 11/06/2015  . Hyperlipidemia LDL goal <70 11/06/2015  . 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 NSVT (nonsustained ventricular tachycardia) ([place]) 11/06/2015  . History of CVA (cerebrovascular accident)   . Claudication of both lower extremities ([place]) 03/22/2015  . Chronic atrial fibrillation ([place]) 04/12/2014  . Pap smear for cervical cancer screening 03/28/2014  . Cigarette smoker 06/15/2013  . Ventral hernia 06/15/2013  . Chronic anticoagulation 02/28/2013  . Hx-TIA (transient ischemic attack) 02/05/13 02/15/2013  . Essential hypertension 02/15/2013    Darrel Hoover  PT 07/05/2019, 1:07 PM  [facility] [address] [place], [place], [address] Phone: [phone]   Fax:  [phone]  Name: [name] MRN: [id number] Date of Birth: 08/01/1966

## 2019-07-12 ENCOUNTER — Other Ambulatory Visit: Payer: Self-pay

## 2019-07-12 ENCOUNTER — Ambulatory Visit: Payer: Medicaid Other

## 2019-07-12 DIAGNOSIS — M545 Low back pain, unspecified: Secondary | ICD-10-CM

## 2019-07-12 DIAGNOSIS — G8929 Other chronic pain: Secondary | ICD-10-CM | POA: Diagnosis not present

## 2019-07-12 DIAGNOSIS — M6281 Muscle weakness (generalized): Secondary | ICD-10-CM

## 2019-07-12 DIAGNOSIS — M25561 Pain in right knee: Secondary | ICD-10-CM | POA: Diagnosis not present

## 2019-07-12 DIAGNOSIS — M25562 Pain in left knee: Secondary | ICD-10-CM | POA: Diagnosis not present

## 2019-07-12 NOTE — Therapy (Signed)
[place], [place], [address] Phone: [phone]   Fax:  [phone]  Physical Therapy Treatment/Discharge  Patient Details  Name: [name] MRN: [id number] Date of Birth: 1966-02-03 Referring Provider (PT): [name], MD   Encounter Date: 07/12/2019  PT End of Session - 07/12/19 1227    Visit Number  6    Number of Visits  12    Date for PT Re-Evaluation  07/17/19    Authorization Type  MCD    Authorization Time Period  4 visit fronm 10-5 to 07/17/19    Authorization - Visit Number  4    Authorization - Number of Visits  4    PT Start Time  1225   late   PT Stop Time  1305    PT Time Calculation (min)  40 min    Activity Tolerance  Patient tolerated treatment well    Behavior During Therapy  [facility] for tasks assessed/performed       Past Medical History:  Diagnosis Date  . Clotting disorder ([place])    on xarelto  . Essential hypertension   . Heart murmur    a. 10/2015 Echo: EF 60-65%, no rwma, mild AI/MR, sev dil LA/RA, PASP 82mHg.  .Marland KitchenNoncompliance   . NS[place] (nonsustained ventricular tachycardia) ([place]    a. 10/2015 during admission for CVA/AF.  .Marland KitchenParoxysmal A-fib ([place]    a. CHA2DS2VASC = 4-->Xarelto;  b. 02/2016 Successful DCCV.  .Marland KitchenPneumonia 2012 x 2; 2015  . Stroke ([facility]    a. 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  . Transient ischemic attack (TIA) 01/2013    Past Surgical History:  Procedure Laterality Date  . CARDIOVERSION N/A 02/18/2013   Procedure: CARDIOVERSION;  Surgeon: MSanda Klein MD;  [facility];  Service: Cardiovascular;  Laterality: N/A;  . CARDIOVERSION N/A 02/23/2013   Procedure: CARDIOVERSION;  Surgeon: DLeonie Man MD;  [facility] [place]  Service: Cardiovascular;  Laterality: N/A;  BESIDE CV  . CARDIOVERSION N/A 03/12/2016   Procedure: CARDIOVERSION;  Surgeon: DLarey Dresser MD;  [place]  Service: Cardiovascular;  Laterality: N/A;  . TEE WITHOUT CARDIOVERSION N/A 02/18/2013   Procedure: TRANSESOPHAGEAL ECHOCARDIOGRAM (TEE);  Surgeon: MSanda Klein MD;  [facility];  Service: Cardiovascular;  Laterality: N/A;  . TUBAL LIGATION  1992    There were no vitals filed for this visit.                    [facility]ult PT Treatment/Exercise - 07/12/19 0001      Lumbar Exercises: Stretches   Lower Trunk Rotation  5 reps    Lower Trunk Rotation Limitations  RT /LT     Other Lumbar Stretch Exercise  side bend lumbar stretching x 5 RT/LT       Lumbar Exercises: Standing   Other Standing Lumbar Exercises  Blue band pulldown, rows, cross body pulls x 15      Other Standing Lumbar Exercises  red band pul ups x 15      Lumbar Exercises: Supine   Pelvic Tilt  10 seconds;10 reps    Bent Knee Raise  20 reps    Dead Bug  15 reps    Dead Bug Limitations  cue for ab set.    Bridge  20 reps    Bridge Limitations  With PPT    Other Supine Lumbar  Exercises  PPT with green band clams x 15 reps    Other Supine Lumbar Exercises  part sit ups with PPT and exhalation. x 15      Lumbar Exercises: Sidelying   Clam  Right;Left;20 reps    Clam Limitations  blue band               PT Short Term Goals - 07/05/19 1305      PT SHORT TERM GOAL #1   Title  She will be independent with initial HEP.    Status  Achieved      PT SHORT TERM GOAL #2   Title  She will report pain in back decr 10% overall    Status  Achieved      PT SHORT TERM GOAL #3   Title  She will demo aware of good posture sitting and standing    Status  Achieved        PT Long Term Goals - 07/12/19 1316      PT LONG TERM GOAL #1   Title  She will be indpendent with all HEP issued    Status  Achieved      PT LONG TERM GOAL #2   Title  She will have intermittant back pain and decreased 75% or more at home and work    Baseline  pain is intermittant  but lifting at work causes pain  that generally stope when she stops the tasks    Status  Partially Met      PT [place] #3   Title  She will report knee pain improved 50% with getting into bed    Baseline  stopped climbing into bed    Status  Achieved            Plan - 07/12/19 1228    Clinical Impression Statement  No pain with exercise.  She reports fatigue but no pain. She stil gets pain with lifting at work. She appears to be diigent with her HEP.   She feels she has enough of a HEP for now and she has 4 levels of theraband  with the highest level of black.    I asked her to work up to 30 repos and progress with black band if able . She agreed. She is ready for discharge.    Personal Factors and Comorbidities  Fitness    PT Treatment/Interventions  Cryotherapy;Moist Heat;Iontophoresis '4mg'$ /ml Dexamethasone;Therapeutic exercise;Therapeutic activities;Patient/family education;Manual techniques;Passive range of motion;Dry needling;Taping    PT Next Visit Plan  Discharge with HEP    PT Home Exercise Plan  SKC , hamstring , clam, LTR strretching   , Band side clams and hip abduciton , bridge, row and shoulder ext  red band,  cross body pulls ,sit to stand    Consulted and Agree with Plan of Care  Patient       Patient will benefit from skilled therapeutic intervention in order to improve the following deficits and impairments:  Obesity, Pain, Decreased strength, Decreased activity tolerance, Increased muscle spasms, Decreased range of motion  Visit Diagnosis: Chronic bilateral low back pain without sciatica  Muscle weakness (generalized)     Problem List Patient Active Problem List   Diagnosis Date Noted  . Edema leg 05/17/2019  . Severe obesity (BMI 35.0-39.9) with comorbidity ([place]) 04/04/2019  . Prediabetes 04/04/2019  . Seasonal allergic rhinitis due to pollen 12/14/2018  . Cough 12/14/2018  . Chronic back pain 08/23/2018  . Chronic diastolic CHF (congestive  heart  failure) ([place]) 06/23/2018  . OSA on CPAP 06/23/2018  . Acute ischemic right MCA stroke ([place]) 02/08/2018  . Headache 01/29/2017  . Refractive errors 04/15/2016  . Medical non-compliance 11/06/2015  . Hyperlipidemia LDL goal <70 11/06/2015  . 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 NSVT (nonsustained ventricular tachycardia) ([place]) 11/06/2015  . History of CVA (cerebrovascular accident)   . Claudication of both lower extremities ([place]) 03/22/2015  . Chronic atrial fibrillation ([place]) 04/12/2014  . Pap smear for cervical cancer screening 03/28/2014  . Cigarette smoker 06/15/2013  . Ventral hernia 06/15/2013  . Chronic anticoagulation 02/28/2013  . Hx-TIA (transient ischemic attack) 02/05/13 02/15/2013  . Essential hypertension 02/15/2013    Darrel Hoover 07/12/2019, 1:18 PM  [facility] [address] [place], [place], [address] Phone: [phone]   Fax:  [phone]  Name: JEHIELI BRASSELL MRN: [id number] Date of Birth: [birth year] PHYSICAL THERAPY DISCHARGE SUMMARY  Visits from Start of Care: 6  Current functional level related to goals / functional outcomes: See above   Remaining deficits: See above   Education / Equipment: HEP Plan: Patient agrees to discharge.  Patient goals were met. Patient is being discharged due to being pleased with the current functional level.  ?????

## 2019-07-17 DIAGNOSIS — R5383 Other fatigue: Secondary | ICD-10-CM | POA: Diagnosis not present

## 2019-07-17 DIAGNOSIS — G4733 Obstructive sleep apnea (adult) (pediatric): Secondary | ICD-10-CM | POA: Diagnosis not present

## 2019-07-17 DIAGNOSIS — R0683 Snoring: Secondary | ICD-10-CM | POA: Diagnosis not present

## 2019-07-29 MED FILL — ATORVASTATIN 80 MG TABLET: 80 | 30 days supply | Qty: 30 | Fill #4

## 2019-07-29 MED FILL — FUROSEMIDE 20 MG TABS: 20 | 30 days supply | Qty: 30 | Fill #1

## 2019-07-29 MED FILL — CARTIA XT 300 MG CAPSULE SA: 300 | 30 days supply | Qty: 30 | Fill #2

## 2019-07-29 MED FILL — XARELTO 20 MG TABLET: 20 | 30 days supply | Qty: 30 | Fill #3

## 2019-08-03 ENCOUNTER — Other Ambulatory Visit: Payer: Self-pay

## 2019-08-03 ENCOUNTER — Ambulatory Visit: Payer: Medicaid Other | Attending: Family Medicine | Admitting: Family Medicine

## 2019-08-03 DIAGNOSIS — M545 Low back pain: Secondary | ICD-10-CM | POA: Diagnosis not present

## 2019-08-03 DIAGNOSIS — E119 Type 2 diabetes mellitus without complications: Secondary | ICD-10-CM | POA: Diagnosis not present

## 2019-08-03 DIAGNOSIS — I482 Chronic atrial fibrillation, unspecified: Secondary | ICD-10-CM

## 2019-08-03 DIAGNOSIS — I10 Essential (primary) hypertension: Secondary | ICD-10-CM

## 2019-08-03 DIAGNOSIS — G8929 Other chronic pain: Secondary | ICD-10-CM

## 2019-08-03 NOTE — Progress Notes (Signed)
Patient has been called and DOB has been verified. Patient has been screened and transferred to PCP to start phone visit.    Patient is having swelling in her hands. 

## 2019-08-03 NOTE — Progress Notes (Signed)
Virtual Visit via Telephone Note  I connected with [name], on 08/03/2019 at 1:38 PM by telephone due to the COVID-19 pandemic and verified that I am speaking with the correct person using two identifiers.   Consent: I discussed the limitations, risks, security and privacy concerns of performing an evaluation and management service by telephone and the availability of in person appointments. I also discussed with the patient that there may be a patient responsible charge related to this service. The patient expressed understanding and agreed to proceed.   Location of Patient: Home  Location of Provider: Clinic   Persons participating in Telemedicine visit: Beda R Tekia [place]-CMA Dr. Margarita Rana     History of Present Illness: [name] is a 53 year old female with a history of atrial fibrillation/atrial flutter (status post cardioversion in 02/2016 currently on rate control with metoprolol and Cardizem and anticoagulation Xarelto s/p DCCV in 2017 ), cerebral infarction due to embolism of right middle cerebral artery status post IV TPA (in 10/2015),  right MCA stroke in 01/2018 (after running out of Xarelto), Type 2 DM (A1c 6.5) here for follow-up visit.   She has chronic back pain which occurs with prolonged standing or after she is done with work She is done with PT and continues her stretching exercises at home and is doing well.  She was diagnosed with Type 2 Diabetes at her last visit and fasting sugars are less than 130. She is trying to eat right. Metformin seldom causes diarrhea especially when she does not eat much. Denies hypoglycemia, paresthesia. Blood pressures have been stable at 132/78  ; she is compliant with her antihypertensive. She has no headaches but her hands still swell occasionally. Denies presence of chest pain, dyspnea and is doing well otherwise. Last visit with cardiology was in 05/2019.  Past Medical History:  Diagnosis Date  . Clotting  disorder ([place])    on xarelto  . Essential hypertension   . Heart murmur    a. 10/2015 Echo: EF 60-65%, no rwma, mild AI/MR, sev dil LA/RA, PASP 8mHg.  .Marland KitchenNoncompliance   . NSVT (nonsustained ventricular tachycardia) ([place]    a. 10/2015 during admission for CVA/AF.  .Marland KitchenParoxysmal A-fib ([place]    a. CHA2DS2VASC = 4-->Xarelto;  b. 02/2016 Successful DCCV.  .Marland KitchenPneumonia 2012 x 2; 2015  . Stroke ([facility]    a. 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  . Transient ischemic attack (TIA) 01/2013   Allergies  Allergen Reactions  . Penicillins Hives and Other (See Comments)    Unknown  Has patient had a PCN reaction causing immediate rash, facial/tongue/throat swelling, SOB or lightheadedness with hypotension: No Has patient had a PCN reaction causing severe rash involving mucus membranes or skin necrosis: No Has patient had a PCN reaction that required hospitalization No Has patient had a PCN reaction occurring within the last 10 years: No If all of the above answers are "NO", then may proceed with Cephalosporin use.    Current Outpatient Medications on File Prior to Visit  Medication Sig Dispense Refill  . Accu-Chek FastClix Lancets MISC Use as instructed to check blood sugar once daily. 102 each 2  . acetaminophen (TYLENOL) 500 MG tablet Take 1,000 mg by mouth every 6 (six) hours as needed for headache.     . albuterol (PROVENTIL HFA;VENTOLIN HFA) 108 (90 Base) MCG/ACT inhaler Inhale 2 puffs into the lungs every 6 (six) hours as  needed for wheezing or shortness of breath. 1 Inhaler 2  . atorvastatin (LIPITOR) 80 MG tablet Take 1 tablet (80 mg total) by mouth daily at 6 PM. 30 tablet 6  . Blood Glucose Monitoring Suppl (ACCU-CHEK GUIDE ME) w/Device KIT 1 kit by Does not apply route daily. 1 kit 0  . butalbital-acetaminophen-caffeine (FIORICET) 50-325-40 MG tablet Take on tablet every 12 hours as needed (Patient  taking differently: Take 1 tablet by mouth every 12 (twelve) hours as needed. ) 40 tablet 1  . cetirizine (ZYRTEC) 10 MG tablet Take 1 tablet (10 mg total) by mouth daily. 30 tablet 6  . diclofenac sodium (VOLTAREN) 1 % GEL Apply 4 g topically 4 (four) times daily. 100 g 3  . diltiazem (CARDIZEM CD) 300 MG 24 hr capsule Take 1 capsule (300 mg total) by mouth daily. 30 capsule 6  . fluticasone (FLONASE) 50 MCG/ACT nasal spray Place 2 sprays into both nostrils daily. 16 g 6  . furosemide (LASIX) 20 MG tablet Take 1 tablet (20 mg total) by mouth as needed. 30 tablet 1  . gabapentin (NEURONTIN) 300 MG capsule TAKE 1 CAPSULE BY MOUTH 2 TIMES DAILY. 60 capsule 3  . glucose blood (ACCU-CHEK GUIDE) test strip Use as instructed to check blood sugar once daily. 100 each 2  . metFORMIN (GLUCOPHAGE) 500 MG tablet Take 1 tablet (500 mg total) by mouth 2 (two) times daily with a meal. 60 tablet 6  . methocarbamol (ROBAXIN) 500 MG tablet Take 1 tablet (500 mg total) by mouth every 8 (eight) hours as needed for muscle spasms. 90 tablet 1  . metoprolol tartrate (LOPRESSOR) 25 MG tablet Take 0.5 tablets (12.5 mg total) by mouth 2 (two) times daily. 30 tablet 6  . Misc. Devices MISC Blood pressure monitor.  Diagnosis Hypertension 1 each 0  . rivaroxaban (XARELTO) 20 MG TABS tablet Take 1 tablet (20 mg total) by mouth daily with supper. 30 tablet 6  . Vitamin D, Ergocalciferol, (DRISDOL) 1.25 MG (50000 UT) CAPS capsule TAKE 1 CAPSULE (50,000 UNITS TOTAL) BY MOUTH ONCE A WEEK. 9 capsule 0   Current Facility-Administered Medications on File Prior to Visit  Medication Dose Route Frequency Provider Last Rate Last Dose  . 0.9 %  sodium chloride infusion  500 mL Intravenous Once Armbruster, Carlota Raspberry, MD        Observations/Objective: Awake, alert, oriented x3 Not in acute distress  Lab Results  Component Value Date   HGBA1C 6.1 (H) 02/09/2018    Assessment and Plan: 1. Type 2 diabetes mellitus without  complication, without long-term current use of insulin (HCC) Controlled with A1c of 6.5 in 04/2019 Continue Metformin Counseled on Diabetic diet, my plate method, 161 minutes of moderate intensity exercise/week Blood sugar logs with fasting goals of 80-120 mg/dl, random of less than 180 and in the event of sugars less than 60 mg/dl or greater than 400 mg/dl encouraged to notify the clinic. Advised on the need for annual eye exams, annual foot exams, Pneumonia vaccine.   2. Chronic atrial fibrillation ([place]) S/p DCCV in 2017 which failed to hold  Currently on anticoagulation with Xarelto On rate control with Cardizem Not a candidate for anticoagulant clinic secondary to biatrial enlargement as per cardiology notes  3. Chronic midline low back pain without sciatica Improved Completed a session of PT Continue home exercise regimen and heat application  4. Essential hypertension Controlled Continue antihypertensives.   Follow Up Instructions: 3 months   I discussed the assessment and  treatment plan with the patient. The patient was provided an opportunity to ask questions and all were answered. The patient agreed with the plan and demonstrated an understanding of the instructions.   The patient was advised to call back or seek an in-person evaluation if the symptoms worsen or if the condition fails to improve as anticipated.     I provided 22 minutes total of non-face-to-face time during this encounter including median intraservice time, reviewing previous notes, labs, imaging, medications, management and patient verbalized understanding.     [name], MD, FAAFP. [facility] [place], [place]   08/03/2019, 1:38 PM

## 2019-08-16 DIAGNOSIS — G4733 Obstructive sleep apnea (adult) (pediatric): Secondary | ICD-10-CM | POA: Diagnosis not present

## 2019-08-16 DIAGNOSIS — R5383 Other fatigue: Secondary | ICD-10-CM | POA: Diagnosis not present

## 2019-08-16 DIAGNOSIS — R0683 Snoring: Secondary | ICD-10-CM | POA: Diagnosis not present

## 2019-08-25 MED FILL — metFORMIN HCL 500 MG TABS: 500 | 30 days supply | Qty: 60 | Fill #2

## 2019-09-07 ENCOUNTER — Other Ambulatory Visit: Payer: Self-pay | Admitting: Cardiology

## 2019-09-07 MED FILL — CETIRIZINE HCL 10 MG TABS: 10 | 30 days supply | Qty: 30 | Fill #2

## 2019-09-07 MED FILL — ATORVASTATIN 80 MG TABLET: 80 | 30 days supply | Qty: 30 | Fill #0

## 2019-09-07 MED FILL — GABAPENTIN 300 MG CAPSULE: 300 | 30 days supply | Qty: 60 | Fill #3

## 2019-09-07 MED FILL — XARELTO 20 MG TABLET: 20 | 30 days supply | Qty: 30 | Fill #4

## 2019-09-07 MED FILL — CARTIA XT 300 MG CAPSULE SA: 300 | 30 days supply | Qty: 30 | Fill #3

## 2019-09-07 MED FILL — METOPROLOL TARTRATE 25 MG T: 25 | 30 days supply | Qty: 30 | Fill #1

## 2019-09-07 MED FILL — HYDROCHLOROTHIAZIDE 12.5 MG: 12.5 | 90 days supply | Qty: 90 | Fill #0

## 2019-09-07 MED FILL — FUROSEMIDE 20 MG TABS: 20 | 30 days supply | Qty: 30 | Fill #0

## 2019-09-14 ENCOUNTER — Telehealth: Payer: Self-pay | Admitting: Family Medicine

## 2019-09-14 NOTE — Telephone Encounter (Signed)
Patient came and dropped off papers for Dr. Margarita Rana to fill out

## 2019-09-16 DIAGNOSIS — R5383 Other fatigue: Secondary | ICD-10-CM | POA: Diagnosis not present

## 2019-09-16 DIAGNOSIS — G4733 Obstructive sleep apnea (adult) (pediatric): Secondary | ICD-10-CM | POA: Diagnosis not present

## 2019-09-16 DIAGNOSIS — R0683 Snoring: Secondary | ICD-10-CM | POA: Diagnosis not present

## 2019-10-03 ENCOUNTER — Ambulatory Visit: Payer: Medicaid Other | Attending: Internal Medicine

## 2019-10-03 DIAGNOSIS — Z20822 Contact with and (suspected) exposure to covid-19: Secondary | ICD-10-CM | POA: Diagnosis not present

## 2019-10-04 ENCOUNTER — Encounter: Payer: Self-pay | Admitting: Family Medicine

## 2019-10-04 LAB — NOVEL CORONAVIRUS, NAA: SARS-CoV-2, NAA: NOT DETECTED

## 2019-10-07 NOTE — Telephone Encounter (Signed)
Patient came in and dropped off paperwork

## 2019-10-18 MED FILL — XARELTO 20 MG TABLET: 20 | 30 days supply | Qty: 30 | Fill #0

## 2019-10-18 MED FILL — METOPROLOL TARTRATE 25 MG T: 25 | 30 days supply | Qty: 30 | Fill #2

## 2019-10-18 MED FILL — ACCU-CHEK FASTCLIX LANCETS: 30 days supply | Qty: 102 | Fill #2

## 2019-10-18 MED FILL — ACCU-CHEK GUIDE TEST STRIP: 30 days supply | Qty: 100 | Fill #2

## 2019-10-20 ENCOUNTER — Other Ambulatory Visit: Payer: Self-pay | Admitting: Family Medicine

## 2019-10-20 ENCOUNTER — Other Ambulatory Visit: Payer: Self-pay

## 2019-10-20 ENCOUNTER — Ambulatory Visit: Payer: Medicaid Other | Attending: Family Medicine | Admitting: Family Medicine

## 2019-10-20 DIAGNOSIS — M545 Low back pain, unspecified: Secondary | ICD-10-CM

## 2019-10-20 DIAGNOSIS — I482 Chronic atrial fibrillation, unspecified: Secondary | ICD-10-CM | POA: Diagnosis not present

## 2019-10-20 DIAGNOSIS — I10 Essential (primary) hypertension: Secondary | ICD-10-CM

## 2019-10-20 DIAGNOSIS — E119 Type 2 diabetes mellitus without complications: Secondary | ICD-10-CM

## 2019-10-20 MED ORDER — DILTIAZEM HCL ER COATED BEADS 300 MG PO CP24: 300.0000 mg | ORAL_CAPSULE | Freq: Every day | ORAL | 6 refills | Status: DC

## 2019-10-20 MED ORDER — METOPROLOL TARTRATE 25 MG PO TABS: 12.5000 mg | ORAL_TABLET | Freq: Two times a day (BID) | ORAL | 6 refills | Status: DC

## 2019-10-20 MED ORDER — RIVAROXABAN 20 MG PO TABS: 20.0000 mg | ORAL_TABLET | Freq: Every day | ORAL | 6 refills | Status: DC

## 2019-10-20 MED ORDER — METFORMIN HCL 500 MG PO TABS: 500.0000 mg | ORAL_TABLET | Freq: Two times a day (BID) | ORAL | 6 refills | Status: DC

## 2019-10-20 MED ORDER — ATORVASTATIN CALCIUM 80 MG PO TABS: 80.0000 mg | ORAL_TABLET | Freq: Every day | ORAL | 6 refills | Status: DC

## 2019-10-20 MED ORDER — METHOCARBAMOL 500 MG PO TABS: 500.0000 mg | ORAL_TABLET | Freq: Three times a day (TID) | ORAL | 1 refills | Status: DC | PRN

## 2019-10-20 MED FILL — metFORMIN HCL 500 MG TABS: 500 | 30 days supply | Qty: 60 | Fill #0

## 2019-10-20 MED FILL — ATORVASTATIN 80 MG TABLET: 80 | 30 days supply | Qty: 30 | Fill #0

## 2019-10-20 MED FILL — METHOCARBAMOL 500 MG TABS: 500 | 30 days supply | Qty: 90 | Fill #0

## 2019-10-20 NOTE — Progress Notes (Signed)
Virtual Visit via Telephone Note  I connected with [name], on 10/20/2019 at 8:57 AM by telephone due to the COVID-19 pandemic and verified that I am speaking with the correct person using two identifiers.   Consent: I discussed the limitations, risks, security and privacy concerns of performing an evaluation and management service by telephone and the availability of in person appointments. I also discussed with the patient that there may be a patient responsible charge related to this service. The patient expressed understanding and agreed to proceed.   Location of Patient: Home  Location of Provider: Clinic   Persons participating in Telemedicine visit: [name] Dr. Margarita Rana     History of Present Illness: Elizabeth Krajewski Cookis a 54 year old female with a history of atrial fibrillation/atrial flutter (status post cardioversion in 02/2016 currently on rate control with metoprolol and Cardizem and anticoagulation Xarelto s/p DCCV in 2017 ), cerebral infarction due to embolism of right middle cerebral artery status post IV TPA (in 10/2015), right MCA stroke in 01/2018 (after running out of Xarelto), Type 2 DM (A1c 6.5)here for follow-up visit. She had some congestion and used alka seltzer cold tablets which were beneficial. She had lost her sense of taste but COVID-19 taste was negative.  With regards to Metformin she sometimes takes her Metformin daily and at other time does it twice daily depending on how large her meal is. Fasting sugars have been in the 90s but she has had no hypoglycemic episodes.   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 She also has intermittent back pain which flares up from time to time but does not radiate down her lower extremities. She denies presence of chest pain, dyspnea, palpitations.  Past Medical History:  Diagnosis Date  . Clotting disorder ([place])    on xarelto  . Essential  hypertension   . Heart murmur    a. 10/2015 Echo: EF 60-65%, no rwma, mild AI/MR, sev dil LA/RA, PASP 8mHg.  .Marland KitchenNoncompliance   . NS[place] (nonsustained [place] tachycardia) ([facility]    a. 10/2015 during admission for CVA/AF.  .Marland KitchenParoxysmal A-fib ([facility]    a. CHA2DS2VASC = 4-->Xarelto;  b. 02/2016 Successful DCCV.  .Marland KitchenPneumonia 2012 x 2; 2015  . Stroke ([facility]    a. 21/9417Embolic CVA of mid right middle cerebral atery - recieved TPA-->small amt of asymptomatic hemorrhagic transformation.  . Transient ischemic attack (TIA) 01/2013   Allergies  Allergen Reactions  . Penicillins Hives and Other (See Comments)    Unknown  Has patient had a PCN reaction causing immediate rash, facial/tongue/throat swelling, SOB or lightheadedness with hypotension: No Has patient had a PCN reaction causing severe rash involving mucus membranes or skin necrosis: No Has patient had a PCN reaction that required hospitalization No Has patient had a PCN reaction occurring within the last 10 years: No If all of the above answers are "NO", then may proceed with Cephalosporin use.    Current Outpatient Medications on File Prior to Visit  Medication Sig Dispense Refill  . Accu-Chek FastClix Lancets MISC Use as instructed to check blood sugar once daily. 102 each 2  . acetaminophen (TYLENOL) 500 MG tablet Take 1,000 mg by mouth every 6 (six) hours as needed for headache.     . albuterol (PROVENTIL HFA;VENTOLIN HFA) 108 (90 Base) MCG/ACT inhaler Inhale 2 puffs into the lungs every 6 (six) hours as needed for wheezing or shortness of breath.  1 Inhaler 2  . atorvastatin (LIPITOR) 80 MG tablet Take 1 tablet (80 mg total) by mouth daily at 6 PM. 30 tablet 6  . Blood Glucose Monitoring Suppl (ACCU-CHEK GUIDE ME) w/Device KIT 1 kit by Does not apply route daily. 1 kit 0  . butalbital-acetaminophen-caffeine (FIORICET) 50-325-40 MG tablet Take on tablet every 12 hours as needed (Patient taking differently: Take 1 tablet by mouth every  12 (twelve) hours as needed. ) 40 tablet 1  . cetirizine (ZYRTEC) 10 MG tablet Take 1 tablet (10 mg total) by mouth daily. 30 tablet 6  . diclofenac sodium (VOLTAREN) 1 % GEL Apply 4 g topically 4 (four) times daily. 100 g 3  . fluticasone (FLONASE) 50 MCG/ACT nasal spray Place 2 sprays into both nostrils daily. 16 g 6  . furosemide (LASIX) 20 MG tablet Take 1 tablet (20 mg total) by mouth as needed. 30 tablet 1  . gabapentin (NEURONTIN) 300 MG capsule TAKE 1 CAPSULE BY MOUTH 2 TIMES DAILY. 60 capsule 3  . glucose blood (ACCU-CHEK GUIDE) test strip Use as instructed to check blood sugar once daily. 100 each 2  . hydrochlorothiazide (MICROZIDE) 12.5 MG capsule TAKE 1 CAPSULE (12.5 MG TOTAL) BY MOUTH DAILY. 90 capsule 3  . metFORMIN (GLUCOPHAGE) 500 MG tablet Take 1 tablet (500 mg total) by mouth 2 (two) times daily with a meal. 60 tablet 6  . methocarbamol (ROBAXIN) 500 MG tablet Take 1 tablet (500 mg total) by mouth every 8 (eight) hours as needed for muscle spasms. 90 tablet 1  . Misc. Devices MISC Blood pressure monitor.  Diagnosis Hypertension 1 each 0  . rivaroxaban (XARELTO) 20 MG TABS tablet Take 1 tablet (20 mg total) by mouth daily with supper. 30 tablet 6  . Vitamin D, Ergocalciferol, (DRISDOL) 1.25 MG (50000 UT) CAPS capsule TAKE 1 CAPSULE (50,000 UNITS TOTAL) BY MOUTH ONCE A WEEK. 9 capsule 0  . diltiazem (CARDIZEM CD) 300 MG 24 hr capsule Take 1 capsule (300 mg total) by mouth daily. 30 capsule 6  . metoprolol tartrate (LOPRESSOR) 25 MG tablet Take 0.5 tablets (12.5 mg total) by mouth 2 (two) times daily. 30 tablet 6   Current Facility-Administered Medications on File Prior to Visit  Medication Dose Route Frequency Provider Last Rate Last Admin  . 0.9 %  sodium chloride infusion  500 mL Intravenous Once Yetta Flock, MD        Observations/Objective: Alert, awake, oriented x3 Not in acute distress   A1c-6.5 from 04/2019  CMP Latest Ref Rng & Units 04/21/2019 09/29/2018  08/23/2018  Glucose 70 - 99 mg/dL 98 102(H) 81  BUN 6 - 20 mg/dL _0 Creatinine 0.44 - 1.00 mg/dL 0.87 0.85 0.97  Sodium 135 - 145 mmol/L 138 143 142  Potassium 3.5 - 5.1 mmol/L 4.1 4.0 4.1  Chloride 98 - 111 mmol/L 106 104 102  CO2 22 - 32 mmol/L 21(L) 20 24  Calcium 8.9 - 10.3 mg/dL 9.3 9.6 9.4  Total Protein 6.5 - 8.1 g/dL 7.5 7.4 -  Total Bilirubin 0.3 - 1.2 mg/dL 0.5 0.4 -  Alkaline Phos 38 - 126 U/L 79 104 -  AST 15 - 41 U/L 24 20 -  ALT 0 - 44 U/L 20 18 -    Assessment and Plan: 1. Chronic atrial fibrillation (HCC) Stable On rate control with Cardizem and anticoagulation with Xarelto - rivaroxaban (XARELTO) 20 MG TABS tablet; Take 1 tablet (20 mg total) by mouth daily with  supper.  Dispense: 30 tablet; Refill: 6 - diltiazem (CARDIZEM CD) 300 MG 24 hr capsule; Take 1 capsule (300 mg total) by mouth daily.  Dispense: 30 capsule; Refill: 6  2. Essential hypertension Controlled Counseled on blood pressure goal of less than 130/80, low-sodium, DASH diet, medication compliance, 150 minutes of moderate intensity exercise per week. Discussed medication compliance, adverse effects. - metoprolol tartrate (LOPRESSOR) 25 MG tablet; Take 0.5 tablets (12.5 mg total) by mouth 2 (two) times daily.  Dispense: 30 tablet; Refill: 6  3. Type 2 diabetes mellitus without complication, without long-term current use of insulin (HCC) Controlled Advised that if fasting sugars are at goal she can remain on Metformin 500 mg daily - Hemoglobin A1c; Future - Microalbumin / creatinine urine ratio; Future - CMP14+EGFR; Future - Lipid panel; Future - metFORMIN (GLUCOPHAGE) 500 MG tablet; Take 1 tablet (500 mg total) by mouth 2 (two) times daily with a meal.  Dispense: 60 tablet; Refill: 6 - atorvastatin (LIPITOR) 80 MG tablet; Take 1 tablet (80 mg total) by mouth daily at 6 PM.  Dispense: 30 tablet; Refill: 6  4. 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  Dispense: 90 tablet; Refill: 1   Follow Up Instructions: 3 months-in person   I discussed the assessment and treatment plan with the patient. The patient was provided an opportunity to ask questions and all were answered. The patient agreed with the plan and demonstrated an understanding of the instructions.   The patient was advised to call back or seek an in-person evaluation if the symptoms worsen or if the condition fails to improve as anticipated.     I provided 19 minutes total of non-face-to-face time during this encounter including median intraservice time, reviewing previous notes, investigations, ordering medications, medical decision making, coordinating care and patient verbalized understanding at the end of the visit.     [name], MD, FAAFP. [facility] and [place], [place]   10/20/2019, 8:57 AM

## 2019-10-20 NOTE — Progress Notes (Signed)
Patient has been called and DOB has been verified. Patient has been screened and transferred to PCP to start phone visit.   Had chest congestion when made the appointment feeling better now.  Wants to get A1C checked to see if she can stop some medications.  Wants to get pap smear done.  Wants information on covid vaccine.

## 2019-10-21 ENCOUNTER — Other Ambulatory Visit: Payer: Self-pay

## 2019-10-21 ENCOUNTER — Encounter: Payer: Self-pay | Admitting: Family Medicine

## 2019-10-21 ENCOUNTER — Ambulatory Visit: Payer: Medicaid Other | Attending: Family Medicine

## 2019-10-21 DIAGNOSIS — E119 Type 2 diabetes mellitus without complications: Secondary | ICD-10-CM

## 2019-10-22 LAB — CMP14+EGFR
ALT: 12 IU/L (ref 0–32)
AST: 17 IU/L (ref 0–40)
Albumin/Globulin Ratio: 1.1 — ABNORMAL LOW (ref 1.2–2.2)
Albumin: 4 g/dL (ref 3.8–4.9)
Alkaline Phosphatase: 101 IU/L (ref 39–117)
BUN/Creatinine Ratio: 12 (ref 9–23)
BUN: 11 mg/dL (ref 6–24)
Bilirubin Total: 0.5 mg/dL (ref 0.0–1.2)
CO2: 22 mmol/L (ref 20–29)
Calcium: 9.4 mg/dL (ref 8.7–10.2)
Chloride: 103 mmol/L (ref 96–106)
Creatinine, Ser: 0.95 mg/dL (ref 0.57–1.00)
GFR calc Af Amer: 78 mL/min/{1.73_m2} (ref >59)
GFR calc non Af Amer: 68 mL/min/{1.73_m2} (ref >59)
Globulin, Total: 3.7 g/dL (ref 1.5–4.5)
Glucose: 87 mg/dL (ref 65–99)
Potassium: 4.4 mmol/L (ref 3.5–5.2)
Sodium: 141 mmol/L (ref 134–144)
Total Protein: 7.7 g/dL (ref 6.0–8.5)

## 2019-10-22 LAB — MICROALBUMIN / CREATININE URINE RATIO
Creatinine, Urine: 173 mg/dL
Microalb/Creat Ratio: 18 mg/g creat (ref 0–29)
Microalbumin, Urine: 30.6 ug/mL

## 2019-10-22 LAB — LIPID PANEL
Chol/HDL Ratio: 2.8 ratio (ref 0.0–4.4)
Cholesterol, Total: 101 mg/dL (ref 100–199)
HDL: 36 mg/dL — ABNORMAL LOW (ref >39)
LDL Chol Calc (NIH): 49 mg/dL (ref 0–99)
Triglycerides: 79 mg/dL (ref 0–149)
VLDL Cholesterol Cal: 16 mg/dL (ref 5–40)

## 2019-10-22 LAB — HEMOGLOBIN A1C
Est. average glucose Bld gHb Est-mCnc: 143 mg/dL
Hgb A1c MFr Bld: 6.6 % — ABNORMAL HIGH (ref 4.8–5.6)

## 2019-10-26 ENCOUNTER — Encounter: Payer: Self-pay | Admitting: Family Medicine

## 2019-11-01 ENCOUNTER — Other Ambulatory Visit: Payer: Self-pay | Admitting: Family Medicine

## 2019-11-01 DIAGNOSIS — R29898 Other symptoms and signs involving the musculoskeletal system: Secondary | ICD-10-CM

## 2019-11-01 MED FILL — CARTIA XT 300 MG CAPSULE SA: 300 | 30 days supply | Qty: 30 | Fill #4

## 2019-11-01 MED FILL — CETIRIZINE HCL 10 MG TABS: 10 | 30 days supply | Qty: 30 | Fill #0

## 2019-11-01 MED FILL — FUROSEMIDE 20 MG TABS: 20 | 30 days supply | Qty: 30 | Fill #1

## 2019-11-02 ENCOUNTER — Other Ambulatory Visit: Payer: Self-pay | Admitting: Family Medicine

## 2019-11-02 ENCOUNTER — Other Ambulatory Visit: Payer: Self-pay

## 2019-11-02 MED ORDER — BUTALBITAL-APAP-CAFFEINE 50-325-40 MG PO TABS: ORAL_TABLET | ORAL | 1 refills | Status: DC

## 2019-11-03 MED FILL — BUTALB-ACETAMIN-CAFF 50-325: 50-325-40 | 20 days supply | Qty: 40 | Fill #0

## 2019-11-04 MED FILL — GABAPENTIN 300 MG CAPSULE: 300 | 30 days supply | Qty: 60 | Fill #0

## 2019-11-09 DIAGNOSIS — Z23 Encounter for immunization: Secondary | ICD-10-CM | POA: Diagnosis not present

## 2019-11-21 ENCOUNTER — Ambulatory Visit: Payer: Medicaid Other | Attending: Internal Medicine

## 2019-11-21 DIAGNOSIS — Z20822 Contact with and (suspected) exposure to covid-19: Secondary | ICD-10-CM

## 2019-11-22 LAB — NOVEL CORONAVIRUS, NAA: SARS-CoV-2, NAA: NOT DETECTED

## 2019-11-22 MED FILL — METOPROLOL TARTRATE 25 MG T: 25 | 90 days supply | Qty: 90 | Fill #3

## 2019-11-22 MED FILL — XARELTO 20 MG TABLET: 20 | 90 days supply | Qty: 90 | Fill #1

## 2019-12-07 DIAGNOSIS — Z23 Encounter for immunization: Secondary | ICD-10-CM | POA: Diagnosis not present

## 2019-12-26 ENCOUNTER — Telehealth: Payer: Self-pay | Admitting: Family Medicine

## 2019-12-26 MED FILL — ATORVASTATIN 80 MG TABLET: 80 | 90 days supply | Qty: 90 | Fill #1

## 2019-12-26 MED FILL — metFORMIN HCL 500 MG TABS: 500 | 90 days supply | Qty: 180 | Fill #1

## 2019-12-26 MED FILL — CARTIA XT 300 MG CAPSULE SA: 300 | 60 days supply | Qty: 60 | Fill #5

## 2019-12-26 NOTE — Telephone Encounter (Signed)
Pt called asking what insurace do we take. I informed the pt that we are taken [place] health care. She asked me what plan she should take. I informed I couldn't tell her what to take. She then asked for the nurse to call her

## 2020-01-12 MED FILL — GABAPENTIN 300 MG CAPSULE: 300 | 90 days supply | Qty: 180 | Fill #1

## 2020-01-18 ENCOUNTER — Encounter: Payer: Self-pay | Admitting: Family Medicine

## 2020-01-18 ENCOUNTER — Ambulatory Visit: Payer: Medicaid Other | Attending: Family Medicine | Admitting: Family Medicine

## 2020-01-18 ENCOUNTER — Other Ambulatory Visit: Payer: Self-pay

## 2020-01-18 ENCOUNTER — Other Ambulatory Visit: Payer: Self-pay | Admitting: Family Medicine

## 2020-01-18 VITALS — BP 103/70 | HR 79 | Ht 63.5 in | Wt 219.0 lb

## 2020-01-18 DIAGNOSIS — I4819 Other persistent atrial fibrillation: Secondary | ICD-10-CM | POA: Diagnosis not present

## 2020-01-18 DIAGNOSIS — I482 Chronic atrial fibrillation, unspecified: Secondary | ICD-10-CM

## 2020-01-18 DIAGNOSIS — Z833 Family history of diabetes mellitus: Secondary | ICD-10-CM | POA: Diagnosis not present

## 2020-01-18 DIAGNOSIS — Z7901 Long term (current) use of anticoagulants: Secondary | ICD-10-CM | POA: Insufficient documentation

## 2020-01-18 DIAGNOSIS — N898 Other specified noninflammatory disorders of vagina: Secondary | ICD-10-CM | POA: Insufficient documentation

## 2020-01-18 DIAGNOSIS — Z7984 Long term (current) use of oral hypoglycemic drugs: Secondary | ICD-10-CM | POA: Insufficient documentation

## 2020-01-18 DIAGNOSIS — E119 Type 2 diabetes mellitus without complications: Secondary | ICD-10-CM | POA: Diagnosis not present

## 2020-01-18 DIAGNOSIS — Z8673 Personal history of transient ischemic attack (TIA), and cerebral infarction without residual deficits: Secondary | ICD-10-CM | POA: Insufficient documentation

## 2020-01-18 DIAGNOSIS — Z79899 Other long term (current) drug therapy: Secondary | ICD-10-CM | POA: Insufficient documentation

## 2020-01-18 DIAGNOSIS — Z880 Allergy status to penicillin: Secondary | ICD-10-CM | POA: Diagnosis not present

## 2020-01-18 DIAGNOSIS — I10 Essential (primary) hypertension: Secondary | ICD-10-CM | POA: Insufficient documentation

## 2020-01-18 DIAGNOSIS — Z9889 Other specified postprocedural states: Secondary | ICD-10-CM | POA: Insufficient documentation

## 2020-01-18 DIAGNOSIS — Z8249 Family history of ischemic heart disease and other diseases of the circulatory system: Secondary | ICD-10-CM | POA: Insufficient documentation

## 2020-01-18 LAB — POCT GLYCOSYLATED HEMOGLOBIN (HGB A1C): HbA1c, POC (controlled diabetic range): 5.9 % (ref 0.0–7.0)

## 2020-01-18 LAB — GLUCOSE, POCT (MANUAL RESULT ENTRY): POC Glucose: 138 mg/dl — AB (ref 70–99)

## 2020-01-18 MED ORDER — FLUCONAZOLE 150 MG PO TABS: 150.0000 mg | ORAL_TABLET | Freq: Once | ORAL | 0 refills | Status: AC

## 2020-01-18 MED ORDER — RIVAROXABAN 20 MG PO TABS: 20.0000 mg | ORAL_TABLET | Freq: Every day | ORAL | 6 refills | Status: DC

## 2020-01-18 MED ORDER — DILTIAZEM HCL ER COATED BEADS 300 MG PO CP24: 300.0000 mg | ORAL_CAPSULE | Freq: Every day | ORAL | 6 refills | Status: DC

## 2020-01-18 MED ORDER — METOPROLOL TARTRATE 25 MG PO TABS: 12.5000 mg | ORAL_TABLET | Freq: Two times a day (BID) | ORAL | 6 refills | Status: DC

## 2020-01-18 MED ORDER — METFORMIN HCL 500 MG PO TABS: 500.0000 mg | ORAL_TABLET | Freq: Two times a day (BID) | ORAL | 6 refills | Status: DC

## 2020-01-18 MED ORDER — ATORVASTATIN CALCIUM 80 MG PO TABS: 80.0000 mg | ORAL_TABLET | Freq: Every day | ORAL | 6 refills | Status: DC

## 2020-01-18 MED FILL — FLUCONAZOLE 150 MG TABLET: 150 | 1 days supply | Qty: 1 | Fill #0

## 2020-01-18 NOTE — Progress Notes (Signed)
Subjective:  Patient ID: [name], female    DOB: [birth year]  Age: 54 y.o. MRN: [id number]  CC: Diabetes   HPI [name] is a 54 year old female with a history of atrial fibrillation/atrial flutter (status post cardioversion in 02/2016 currently on rate control with metoprolol and Cardizem and anticoagulation Xarelto s/p DCCV in 2017 ), cerebral infarction due to embolism of right middle cerebral artery status post IV TPA (in 10/2015), right MCA stroke in 01/2018 (after running out of Xarelto), Type 2 DM (A1c 6.5)here for follow-up visit.  With regards to her Diabetes mellitus she reports doing well. She denies presence of hypoglycemia; currently taking Metformin once daily. She does not get any form of exercise but plans to commence an exercise regimen.  Standing still aggraviates her back pain but this is absent at the moment and her medications have been effective.  Her A. fib has been stable and she denies chest pain, dyspnea, palpitations. She complains of intermittent vaginal irritation and itching. Endorses douching and using wipes in her external genitalia.  Past Medical History:  Diagnosis Date  . Clotting disorder ([place])    on xarelto  . Essential hypertension   . Heart murmur    a. 10/2015 Echo: EF 60-65%, no rwma, mild AI/MR, sev dil LA/RA, PASP 33mHg.  .Marland KitchenNoncompliance   . NSVT (nonsustained ventricular tachycardia) ([place]    a. 10/2015 during admission for CVA/AF.  .Marland KitchenParoxysmal A-fib ([facility]    a. CHA2DS2VASC = 4-->Xarelto;  b. 02/2016 Successful DCCV.  .Marland KitchenPneumonia 2012 x 2; 2015  . Stroke ([facility]    a. 23/8182Embolic CVA of mid right middle cerebral atery - recieved TPA-->small amt of asymptomatic hemorrhagic transformation.  . Transient ischemic attack (TIA) 01/2013    Past Surgical History:  Procedure Laterality Date  . CARDIOVERSION N/A 02/18/2013   Procedure: CARDIOVERSION;  Surgeon: MSanda Klein MD;  [facility];  Service: Cardiovascular;   Laterality: N/A;  . CARDIOVERSION N/A 02/23/2013   Procedure: CARDIOVERSION;  Surgeon: DLeonie Man MD;  [facility]  Service: Cardiovascular;  Laterality: N/A;  BESIDE CV  . CARDIOVERSION N/A 03/12/2016   Procedure: CARDIOVERSION;  Surgeon: DLarey Dresser MD;  [place]  Service: Cardiovascular;  Laterality: N/A;  . TEE WITHOUT CARDIOVERSION N/A 02/18/2013   Procedure: TRANSESOPHAGEAL ECHOCARDIOGRAM (TEE);  Surgeon: MSanda Klein MD;  [facility];  Service: Cardiovascular;  Laterality: N/A;  . TUBAL LIGATION  1992    Family History  Problem Relation Age of Onset  . Cancer Mother   . Atrial fibrillation Mother   . Breast cancer Mother   . Hypertension Father   . Atrial fibrillation Father   . Peripheral vascular disease Father   . Hypertension Other   . Diabetes Other   . Heart attack Neg Hx   . Stroke Neg Hx   . Rectal cancer Neg Hx   . Colon cancer Neg Hx   . Esophageal cancer Neg Hx   . Stomach cancer Neg Hx     Allergies  Allergen Reactions  . Penicillins Hives and Other (See Comments)    Unknown  Has patient had a PCN reaction causing immediate rash, facial/tongue/throat swelling, SOB or lightheadedness with hypotension: No Has patient had a PCN reaction causing severe rash involving mucus membranes or skin necrosis: No Has patient had a PCN reaction that required hospitalization No Has patient had a PCN reaction occurring within the last 10 years: No If all of the  above answers are "NO", then may proceed with Cephalosporin use.    Outpatient Medications Prior to Visit  Medication Sig Dispense Refill  . Accu-Chek FastClix Lancets MISC Use as instructed to check blood sugar once daily. 102 each 2  . acetaminophen (TYLENOL) 500 MG tablet Take 1,000 mg by mouth every 6 (six) hours as needed for headache.     . albuterol (PROVENTIL HFA;VENTOLIN HFA) 108 (90 Base) MCG/ACT inhaler Inhale 2 puffs into the lungs every 6 (six) hours as needed  for wheezing or shortness of breath. 1 Inhaler 2  . atorvastatin (LIPITOR) 80 MG tablet Take 1 tablet (80 mg total) by mouth daily at 6 PM. 30 tablet 6  . Blood Glucose Monitoring Suppl (ACCU-CHEK GUIDE ME) w/Device KIT 1 kit by Does not apply route daily. 1 kit 0  . butalbital-acetaminophen-caffeine (FIORICET) 50-325-40 MG tablet TAKE ONE TABLET EVERY 12 HOURS AS NEEDED 40 tablet 1  . cetirizine (ZYRTEC) 10 MG tablet Take 1 tablet (10 mg total) by mouth daily. 30 tablet 6  . diclofenac sodium (VOLTAREN) 1 % GEL Apply 4 g topically 4 (four) times daily. 100 g 3  . diltiazem (CARDIZEM CD) 300 MG 24 hr capsule Take 1 capsule (300 mg total) by mouth daily. 30 capsule 6  . fluticasone (FLONASE) 50 MCG/ACT nasal spray Place 2 sprays into both nostrils daily. 16 g 6  . furosemide (LASIX) 20 MG tablet Take 1 tablet (20 mg total) by mouth as needed. 30 tablet 1  . gabapentin (NEURONTIN) 300 MG capsule TAKE 1 CAPSULE BY MOUTH 2 TIMES DAILY. 60 capsule 3  . glucose blood (ACCU-CHEK GUIDE) test strip Use as instructed to check blood sugar once daily. 100 each 2  . hydrochlorothiazide (MICROZIDE) 12.5 MG capsule TAKE 1 CAPSULE (12.5 MG TOTAL) BY MOUTH DAILY. 90 capsule 3  . metFORMIN (GLUCOPHAGE) 500 MG tablet Take 1 tablet (500 mg total) by mouth 2 (two) times daily with a meal. 60 tablet 6  . methocarbamol (ROBAXIN) 500 MG tablet Take 1 tablet (500 mg total) by mouth every 8 (eight) hours as needed for muscle spasms. 90 tablet 1  . metoprolol tartrate (LOPRESSOR) 25 MG tablet Take 0.5 tablets (12.5 mg total) by mouth 2 (two) times daily. 30 tablet 6  . Misc. Devices MISC Blood pressure monitor.  Diagnosis Hypertension 1 each 0  . rivaroxaban (XARELTO) 20 MG TABS tablet Take 1 tablet (20 mg total) by mouth daily with supper. 30 tablet 6  . Vitamin D, Ergocalciferol, (DRISDOL) 1.25 MG (50000 UT) CAPS capsule TAKE 1 CAPSULE (50,000 UNITS TOTAL) BY MOUTH ONCE A WEEK. 9 capsule 0   Facility-Administered  Medications Prior to Visit  Medication Dose Route Frequency Provider Last Rate Last Admin  . 0.9 %  sodium chloride infusion  500 mL Intravenous Once Armbruster, Carlota Raspberry, MD         ROS Review of Systems  Constitutional: Negative for activity change, appetite change and fatigue.  HENT: Negative for congestion, sinus pressure and sore throat.   Eyes: Negative for visual disturbance.  Respiratory: Negative for cough, chest tightness, shortness of breath and wheezing.   Cardiovascular: Negative for chest pain and palpitations.  Gastrointestinal: Negative for abdominal distention, abdominal pain and constipation.  Endocrine: Negative for polydipsia.  Genitourinary: Negative for dysuria and frequency.  Musculoskeletal: Negative for arthralgias and back pain.  Skin: Negative for rash.  Neurological: Negative for tremors, light-headedness and numbness.  Hematological: Does not bruise/bleed easily.  Psychiatric/Behavioral: Negative for agitation  and behavioral problems.    Objective:  BP 103/70   Pulse 79   Ht 5' 3.5" (1.613 m)   Wt 219 lb (99.3 kg)   SpO2 98%   BMI 38.19 kg/m   BP/Weight 01/18/2020 05/17/2019 7/41/6384  Systolic BP 536 468 032  Diastolic BP 70 72 81  Wt. (Lbs) 219 222.8 222  BMI 38.19 38.85 38.11      Physical Exam Constitutional:      Appearance: She is well-developed. She is obese.  Neck:     Vascular: No JVD.  Cardiovascular:     Rate and Rhythm: Normal rate. Rhythm irregular.     Heart sounds: Normal heart sounds. No murmur.  Pulmonary:     Effort: Pulmonary effort is normal.     Breath sounds: Normal breath sounds. No wheezing or rales.  Chest:     Chest wall: No tenderness.  Abdominal:     General: Bowel sounds are normal. There is no distension.     Palpations: Abdomen is soft. There is no mass.     Tenderness: There is no abdominal tenderness.  Musculoskeletal:        General: Normal range of motion.     Right lower leg: No edema.     Left  lower leg: No edema.  Neurological:     Mental Status: She is alert and oriented to person, place, and time.  Psychiatric:        Mood and Affect: Mood normal.     CMP Latest Ref Rng & Units 10/21/2019 04/21/2019 09/29/2018  Glucose 65 - 99 mg/dL 87 98 102(H)  BUN 6 - 24 mg/dL '11 10 14  '$ Creatinine 0.57 - 1.00 mg/dL 0.95 0.87 0.85  Sodium 134 - 144 mmol/L 141 138 143  Potassium 3.5 - 5.2 mmol/L 4.4 4.1 4.0  Chloride 96 - 106 mmol/L 103 106 104  CO2 20 - 29 mmol/L 22 21(L) 20  Calcium 8.7 - 10.2 mg/dL 9.4 9.3 9.6  Total Protein 6.0 - 8.5 g/dL 7.7 7.5 7.4  Total Bilirubin 0.0 - 1.2 mg/dL 0.5 0.5 0.4  Alkaline Phos 39 - 117 IU/L 101 79 104  AST 0 - 40 IU/L '17 24 20  '$ ALT 0 - 32 IU/L '12 20 18    '$ Lipid Panel     Component Value Date/Time   CHOL 101 10/21/2019 1458   TRIG 79 10/21/2019 1458   HDL 36 (L) 10/21/2019 1458   CHOLHDL 2.8 10/21/2019 1458   CHOLHDL 3.1 02/09/2018 0531   VLDL 17 02/09/2018 0531   LDLCALC 49 10/21/2019 1458    CBC    Component Value Date/Time   WBC 8.9 04/21/2019 1104   RBC 4.91 04/21/2019 1104   HGB 13.1 04/21/2019 1104   HCT 42.7 04/21/2019 1104   PLT 205 04/21/2019 1104   MCV 87.0 04/21/2019 1104   MCH 26.7 04/21/2019 1104   MCHC 30.7 04/21/2019 1104   RDW 15.2 04/21/2019 1104   LYMPHSABS 2.3 04/21/2019 1104   MONOABS 0.8 04/21/2019 1104   EOSABS 0.2 04/21/2019 1104   BASOSABS 0.1 04/21/2019 1104    Lab Results  Component Value Date   HGBA1C 5.9 01/18/2020     Assessment & Plan:   1. Type 2 diabetes mellitus without complication, without long-term current use of insulin (HCC) Controlled with A1c of 5.9 No regimen change Counseled on Diabetic diet, my plate method, 122 minutes of moderate intensity exercise/week Blood sugar logs with fasting goals of 80-120 mg/dl, random of  less than 180 and in the event of sugars less than 60 mg/dl or greater than 400 mg/dl encouraged to notify the clinic. Advised on the need for annual eye exams,  annual foot exams, Pneumonia vaccine. - POCT glucose (manual entry) - POCT glycosylated hemoglobin (Hb A1C) - atorvastatin (LIPITOR) 80 MG tablet; Take 1 tablet (80 mg total) by mouth daily at 6 PM.  Dispense: 30 tablet; Refill: 6 - metFORMIN (GLUCOPHAGE) 500 MG tablet; Take 1 tablet (500 mg total) by mouth 2 (two) times daily with a meal.  Dispense: 60 tablet; Refill: 6  2. Chronic atrial fibrillation (HCC) She is in persistent A. fib Continue Cardizem for rate control Xarelto for anticoagulation - diltiazem (CARDIZEM CD) 300 MG 24 hr capsule; Take 1 capsule (300 mg total) by mouth daily.  Dispense: 30 capsule; Refill: 6 - rivaroxaban (XARELTO) 20 MG TABS tablet; Take 1 tablet (20 mg total) by mouth daily with supper.  Dispense: 30 tablet; Refill: 6  3. Essential hypertension Controlled Counseled on blood pressure goal of less than 130/80, low-sodium, DASH diet, medication compliance, 150 minutes of moderate intensity exercise per week. Discussed medication compliance, adverse effects. - metoprolol tartrate (LOPRESSOR) 25 MG tablet; Take 0.5 tablets (12.5 mg total) by mouth 2 (two) times daily.  Dispense: 30 tablet; Refill: 6  4. Vaginal irritation Advised against douching We will place on Diflucan - fluconazole (DIFLUCAN) 150 MG tablet; Take 1 tablet (150 mg total) by mouth once for 1 dose.  Dispense: 1 tablet; Refill: 0    Return in about 6 months (around 07/20/2020) for Chronic disease management.   [name], MD, FAAFP. [facility] and [place], [place]   01/18/2020, 9:54 AM

## 2020-01-18 NOTE — Patient Instructions (Signed)
Diabetes Mellitus and Exercise Exercising regularly is important for your overall health, especially when you have diabetes (diabetes mellitus). Exercising is not only about losing weight. It has many other health benefits, such as increasing muscle strength and bone density and reducing body fat and stress. This leads to improved fitness, flexibility, and endurance, all of which result in better overall health. Exercise has additional benefits for people with diabetes, including:  Reducing appetite.  Helping to lower and control blood glucose.  Lowering blood pressure.  Helping to control amounts of fatty substances (lipids) in the blood, such as cholesterol and triglycerides.  Helping the body to respond better to insulin (improving insulin sensitivity).  Reducing how much insulin the body needs.  Decreasing the risk for heart disease by: ? Lowering cholesterol and triglyceride levels. ? Increasing the levels of good cholesterol. ? Lowering blood glucose levels. What is my activity plan? Your health care provider or certified diabetes educator can help you make a plan for the type and frequency of exercise (activity plan) that works for you. Make sure that you:  Do at least 150 minutes of moderate-intensity or vigorous-intensity exercise each week. This could be brisk walking, biking, or water aerobics. ? Do stretching and strength exercises, such as yoga or weightlifting, at least 2 times a week. ? Spread out your activity over at least 3 days of the week.  Get some form of physical activity every day. ? Do not go more than 2 days in a row without some kind of physical activity. ? Avoid being inactive for more than 30 minutes at a time. Take frequent breaks to walk or stretch.  Choose a type of exercise or activity that you enjoy, and set realistic goals.  Start slowly, and gradually increase the intensity of your exercise over time. What do I need to know about managing my  diabetes?   Check your blood glucose before and after exercising. ? If your blood glucose is 240 mg/dL (13.3 mmol/L) or higher before you exercise, check your urine for ketones. If you have ketones in your urine, do not exercise until your blood glucose returns to normal. ? If your blood glucose is 100 mg/dL (5.6 mmol/L) or lower, eat a snack containing 15-20 grams of carbohydrate. Check your blood glucose 15 minutes after the snack to make sure that your level is above 100 mg/dL (5.6 mmol/L) before you start your exercise.  Know the symptoms of low blood glucose (hypoglycemia) and how to treat it. Your risk for hypoglycemia increases during and after exercise. Common symptoms of hypoglycemia can include: ? Hunger. ? Anxiety. ? Sweating and feeling clammy. ? Confusion. ? Dizziness or feeling light-headed. ? Increased heart rate or palpitations. ? Blurry vision. ? Tingling or numbness around the mouth, lips, or tongue. ? Tremors or shakes. ? Irritability.  Keep a rapid-acting carbohydrate snack available before, during, and after exercise to help prevent or treat hypoglycemia.  Avoid injecting insulin into areas of the body that are going to be exercised. For example, avoid injecting insulin into: ? The arms, when playing tennis. ? The legs, when jogging.  Keep records of your exercise habits. Doing this can help you and your health care provider adjust your diabetes management plan as needed. Write down: ? Food that you eat before and after you exercise. ? Blood glucose levels before and after you exercise. ? The type and amount of exercise you have done. ? When your insulin is expected to peak, if you use   insulin. Avoid exercising at times when your insulin is peaking.  When you start a new exercise or activity, work with your health care provider to make sure the activity is safe for you, and to adjust your insulin, medicines, or food intake as needed.  Drink plenty of water while  you exercise to prevent dehydration or heat stroke. Drink enough fluid to keep your urine clear or pale yellow. Summary  Exercising regularly is important for your overall health, especially when you have diabetes (diabetes mellitus).  Exercising has many health benefits, such as increasing muscle strength and bone density and reducing body fat and stress.  Your health care provider or certified diabetes educator can help you make a plan for the type and frequency of exercise (activity plan) that works for you.  When you start a new exercise or activity, work with your health care provider to make sure the activity is safe for you, and to adjust your insulin, medicines, or food intake as needed. This information is not intended to replace advice given to you by your health care provider. Make sure you discuss any questions you have with your health care provider. Document Revised: 03/26/2017 Document Reviewed: 02/11/2016 Elsevier Patient Education  2020 [employer]  

## 2020-02-08 ENCOUNTER — Other Ambulatory Visit: Payer: Self-pay

## 2020-02-08 MED ORDER — FUROSEMIDE 20 MG PO TABS: 20.0000 mg | ORAL_TABLET | ORAL | 2 refills | Status: DC | PRN

## 2020-02-08 MED FILL — FUROSEMIDE 20 MG TABS: 20 | 90 days supply | Qty: 90 | Fill #0

## 2020-03-01 MED FILL — XARELTO 20 MG TABLET: 20 | 90 days supply | Qty: 90 | Fill #2

## 2020-03-07 ENCOUNTER — Other Ambulatory Visit: Payer: Self-pay | Admitting: Family Medicine

## 2020-03-07 DIAGNOSIS — E119 Type 2 diabetes mellitus without complications: Secondary | ICD-10-CM

## 2020-03-07 MED FILL — METOPROLOL TARTRATE 25 MG T: 25 | 30 days supply | Qty: 30 | Fill #4

## 2020-03-07 MED FILL — ACCU-CHEK FASTCLIX LANCETS: 90 days supply | Qty: 102 | Fill #0

## 2020-03-12 ENCOUNTER — Other Ambulatory Visit: Payer: Self-pay | Admitting: Family Medicine

## 2020-03-12 DIAGNOSIS — E119 Type 2 diabetes mellitus without complications: Secondary | ICD-10-CM

## 2020-03-13 MED FILL — ACCU-CHEK GUIDE TEST STRIP: 30 days supply | Qty: 100 | Fill #0

## 2020-03-21 MED FILL — ACCU-CHEK GUIDE TEST STRIP: 30 days supply | Qty: 100 | Fill #0

## 2020-03-22 ENCOUNTER — Ambulatory Visit: Payer: Medicaid Other | Attending: Physician Assistant | Admitting: Physician Assistant

## 2020-03-22 ENCOUNTER — Other Ambulatory Visit: Payer: Self-pay

## 2020-03-22 VITALS — BP 123/70 | HR 83 | Temp 97.7°F | Ht 64.0 in | Wt 220.0 lb

## 2020-03-22 DIAGNOSIS — N390 Urinary tract infection, site not specified: Secondary | ICD-10-CM | POA: Diagnosis not present

## 2020-03-22 DIAGNOSIS — R319 Hematuria, unspecified: Secondary | ICD-10-CM

## 2020-03-22 DIAGNOSIS — I10 Essential (primary) hypertension: Secondary | ICD-10-CM | POA: Diagnosis not present

## 2020-03-22 DIAGNOSIS — R34 Anuria and oliguria: Secondary | ICD-10-CM | POA: Diagnosis not present

## 2020-03-22 DIAGNOSIS — E119 Type 2 diabetes mellitus without complications: Secondary | ICD-10-CM

## 2020-03-22 DIAGNOSIS — R351 Nocturia: Secondary | ICD-10-CM

## 2020-03-22 LAB — POCT URINALYSIS DIP (CLINITEK)
Bilirubin, UA: NEGATIVE
Glucose, UA: NEGATIVE mg/dL
Ketones, POC UA: NEGATIVE mg/dL
Nitrite, UA: NEGATIVE
Spec Grav, UA: 1.025 (ref 1.010–1.025)
Urobilinogen, UA: 1 E.U./dL
pH, UA: 5.5 (ref 5.0–8.0)

## 2020-03-22 LAB — GLUCOSE, POCT (MANUAL RESULT ENTRY): POC Glucose: 154 mg/dl — AB (ref 70–99)

## 2020-03-22 MED ORDER — SULFAMETHOXAZOLE-TRIMETHOPRIM 800-160 MG PO TABS: 1.0000 | ORAL_TABLET | Freq: Two times a day (BID) | ORAL | 0 refills | Status: DC

## 2020-03-22 MED ORDER — FLUCONAZOLE 150 MG PO TABS: 150.0000 mg | ORAL_TABLET | Freq: Once | ORAL | 0 refills | Status: AC

## 2020-03-22 NOTE — Patient Instructions (Signed)
Drink 80-100 ounces water daily 

## 2020-03-22 NOTE — Progress Notes (Signed)
Patient ID: [name], female   DOB: Jan 14, 1966, 54 y.o.   MRN: [id number]   [name], is a 54 y.o. female  KXF:[id number]  IRC:[id number]  DOB - 1966-06-02  Subjective:  Chief Complaint and HPI: [name] is a 54 y.o. female here today with continued urinary problems.  She has "played around with diuretic" dosing and taking lasix Prn for hand/leg swelling but still c/o frequent urination resulting in small amounts of urine.  This has been going on for several months.  No hysterectomy.  Says almost feels as though something "pusshes on her bladder."  No dysuria.  Not SA.  No vaginal discharge.  No abdominal pain.  No fever.  She was placed on lasix for hand swelling and occasional leg swelling  ROS:   Constitutional:  No f/c, No night sweats, No unexplained weight loss. EENT:  No vision changes, No blurry vision, No hearing changes. No mouth, throat, or ear problems.  Respiratory: No cough, No SOB Cardiac: No CP, no palpitations GI:  No abd pain, No N/V/D. GU: see above Musculoskeletal: No joint pain Neuro: No headache, no dizziness, no motor weakness.  Skin: No rash Endocrine:  No polydipsia. No polyuria.  Psych: Denies SI/HI  No problems updated.  ALLERGIES: Allergies  Allergen Reactions  . Penicillins Hives and Other (See Comments)    Unknown  Has patient had a PCN reaction causing immediate rash, facial/tongue/throat swelling, SOB or lightheadedness with hypotension: No Has patient had a PCN reaction causing severe rash involving mucus membranes or skin necrosis: No Has patient had a PCN reaction that required hospitalization No Has patient had a PCN reaction occurring within the last 10 years: No If all of the above answers are "NO", then may proceed with Cephalosporin use.    PAST MEDICAL HISTORY: Past Medical History:  Diagnosis Date  . Clotting disorder ([place])    on xarelto  . Essential hypertension   . Heart murmur    a. 10/2015 Echo: EF 60-65%, no rwma,  mild AI/MR, sev dil LA/RA, PASP 8mHg.  .Marland KitchenNoncompliance   . [place] ([place]ustained [place]) [place]    a. 10/2015 during admission for CVA/AF.  .[place] KitchenParoxysmal A-fib ([place]    a. CHA2DS2VASC = 4-->Xarelto;  b. 02/2016 Successful DCCV.  .Marland KitchenPneumonia 2012 x 2; 2015  . Stroke ([facility]    a. 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  . Transient ischemic attack ([facility]) 01/2013    MEDICATIONS AT HOME: Prior to Admission medications   Medication Sig Start Date End Date Taking? Authorizing Provider  Accu-Chek FastClix Lancets MISC USE AS INSTRUCTED TO CHECK BLOOD SUGAR ONCE DAILY. 03/07/20  Yes NCharlott Rakes MD  ACCU-CHEK GUIDE test strip USE AS INSTRUCTED TO CHECK BLOOD SUGAR ONCE DAILY. 03/13/20  Yes NCharlott Rakes MD  acetaminophen (TYLENOL) 500 MG tablet Take 1,000 mg by mouth every 6 (six) hours as needed for headache.    Yes [provider]  albuterol (PROVENTIL HFA;VENTOLIN HFA) 108 (90 Base) MCG/ACT inhaler Inhale 2 puffs into the lungs every 6 (six) hours as needed for wheezing or shortness of breath. 11/17/18  Yes NCharlott Rakes MD  atorvastatin (LIPITOR) 80 MG tablet Take 1 tablet (80 mg total) by mouth daily at 6 PM. 01/18/20  Yes Newlin, Enobong, MD  Blood Glucose Monitoring Suppl (ACCU-CHEK GUIDE ME) w/Device KIT 1 kit by Does not apply route daily. 05/02/19  Yes NCharlott Rakes MD  butalbital-acetaminophen-caffeine (  FIORICET) 50-325-40 MG tablet TAKE ONE TABLET EVERY 12 HOURS AS NEEDED 11/02/19  Yes [name], MD  cetirizine (ZYRTEC) 10 MG tablet Take 1 tablet (10 mg total) by mouth daily. 12/14/18  Yes [name] Stain, MD  diclofenac sodium (VOLTAREN) 1 % GEL Apply 4 g topically 4 (four) times daily. 05/02/19  Yes Newlin, Charlane Ferretti, MD  fluticasone (FLONASE) 50 MCG/ACT nasal spray Place 2 sprays into both nostrils daily. 12/14/18  Yes [name] Stain, MD  furosemide (LASIX) 20 MG tablet  Take 1 tablet (20 mg total) by mouth as needed. 02/08/20  Yes Kilroy, Luke K, PA-C  gabapentin (NEURONTIN) 300 MG capsule TAKE 1 CAPSULE BY MOUTH 2 TIMES DAILY. 11/04/19  Yes [name], MD  metFORMIN (GLUCOPHAGE) 500 MG tablet Take 1 tablet (500 mg total) by mouth 2 (two) times daily with a meal. 01/18/20  Yes Newlin, Enobong, MD  methocarbamol (ROBAXIN) 500 MG tablet Take 1 tablet (500 mg total) by mouth every 8 (eight) hours as needed for muscle spasms. 10/20/19  Yes [name], MD  Misc. Devices MISC Blood pressure monitor.  Diagnosis Hypertension 02/11/19  Yes [name], MD  rivaroxaban (XARELTO) 20 MG TABS tablet Take 1 tablet (20 mg total) by mouth daily with supper. 01/18/20  Yes [name], MD  Vitamin D, Ergocalciferol, (DRISDOL) 1.25 MG (50000 UT) CAPS capsule TAKE 1 CAPSULE (50,000 UNITS TOTAL) BY MOUTH ONCE A WEEK. 06/20/19  Yes [name], MD  diltiazem (CARDIZEM CD) 300 MG 24 hr capsule Take 1 capsule (300 mg total) by mouth daily. 01/18/20 02/17/20  [name], MD  hydrochlorothiazide (MICROZIDE) 12.5 MG capsule TAKE 1 CAPSULE (12.5 MG TOTAL) BY MOUTH DAILY. 09/07/19 12/06/19  Erlene Quan, PA-C  metoprolol tartrate (LOPRESSOR) 25 MG tablet Take 0.5 tablets (12.5 mg total) by mouth 2 (two) times daily. 01/18/20 02/17/20  [name], MD     Objective:  EXAM:   Vitals:   03/22/20 1438  BP: 123/70  Pulse: 83  Temp: 97.7 F (36.5 C)  TempSrc: Temporal  SpO2: 97%  Weight: 220 lb (99.8 kg)  Height: _0  (1.626 m)    General appearance : A&OX3. NAD. Non-toxic-appearing HEENT: Atraumatic and Normocephalic.  PERRLA. EOM intact.  Chest/Lungs:  Breathing-non-labored, Good air entry bilaterally, breath sounds normal without rales, rhonchi, or wheezing  CVS: S1 S2 regular, no murmurs, gallops, rubs  Extremities: Bilateral Lower Ext shows no edema, both legs are warm to touch with = pulse throughout Neurology:  CN II-XII grossly intact, Non focal.   Psych:  TP  linear. J/I WNL. Normal speech. Appropriate eye contact and affect.  Skin:  No Rash  Data Review Lab Results  Component Value Date   HGBA1C 5.9 01/18/2020   HGBA1C 6.6 (H) 10/21/2019   HGBA1C 6.1 (H) 02/09/2018     Assessment & Plan   1. Oliguria CMP WNL 10/2019 - Glucose (CBG) - POCT URINALYSIS DIP (CLINITEK) - Ambulatory referral to Urology  2. Type 2 diabetes mellitus without complication, without long-term current use of insulin ([place]) A1C=5.9 in May 2021 I have had a lengthy discussion and provided education about insulin resistance and the intake of too much sugar/refined carbohydrates.  I have advised the patient to work at a goal of eliminating sugary drinks, candy, desserts, sweets, refined sugars, processed foods, and white carbohydrates.  The patient expresses understanding.  - Glucose (CBG) Continue current regimen  3. Nocturia Urinary/liquid hygiene.  Stopping liquids after 6pm, etc.   - Ambulatory referral to  Urology currenlty UTI-septra DS bid X7days and diflucan after  4. Essential hypertension Controlled;  Continue current regimen     Patient have been counseled extensively about nutrition and exercise  Return in about 4 months (around 07/23/2020) for with PCP/chronic conditions.  The patient was given clear instructions to go to ER or return to medical center if symptoms don't improve, worsen or new problems develop. The patient verbalized understanding. The patient was told to call to get lab results if they haven't heard anything in the next week.     Freeman Caldron, PA-C [facility] [place], [place]   03/22/2020, 3:08 PM

## 2020-03-23 MED FILL — SULFAMETHOXAZOLE-TMP DS TAB: 800-160 | 7 days supply | Qty: 14 | Fill #0

## 2020-03-23 MED FILL — FLUCONAZOLE 150 MG TABLET: 150 | 1 days supply | Qty: 1 | Fill #0

## 2020-03-25 LAB — URINE CULTURE

## 2020-04-10 ENCOUNTER — Emergency Department (HOSPITAL_COMMUNITY)
Admission: EM | Admit: 2020-04-10 | Discharge: 2020-04-10 | Disposition: A | Discharge status: Home / Self Care | Payer: Medicaid Other | Attending: Emergency Medicine | Admitting: Emergency Medicine

## 2020-04-10 ENCOUNTER — Emergency Department (HOSPITAL_COMMUNITY): Payer: Medicaid Other

## 2020-04-10 ENCOUNTER — Other Ambulatory Visit (HOSPITAL_COMMUNITY): Payer: Self-pay | Admitting: Physician Assistant

## 2020-04-10 ENCOUNTER — Other Ambulatory Visit: Payer: Self-pay

## 2020-04-10 DIAGNOSIS — R0602 Shortness of breath: Secondary | ICD-10-CM | POA: Diagnosis present

## 2020-04-10 DIAGNOSIS — I5033 Acute on chronic diastolic (congestive) heart failure: Secondary | ICD-10-CM | POA: Diagnosis not present

## 2020-04-10 DIAGNOSIS — Z20822 Contact with and (suspected) exposure to covid-19: Secondary | ICD-10-CM | POA: Insufficient documentation

## 2020-04-10 DIAGNOSIS — I10 Essential (primary) hypertension: Secondary | ICD-10-CM

## 2020-04-10 DIAGNOSIS — J811 Chronic pulmonary edema: Secondary | ICD-10-CM | POA: Diagnosis not present

## 2020-04-10 DIAGNOSIS — Z79899 Other long term (current) drug therapy: Secondary | ICD-10-CM | POA: Insufficient documentation

## 2020-04-10 DIAGNOSIS — Z0271 Encounter for disability determination: Secondary | ICD-10-CM

## 2020-04-10 DIAGNOSIS — I4891 Unspecified atrial fibrillation: Secondary | ICD-10-CM | POA: Insufficient documentation

## 2020-04-10 DIAGNOSIS — I5032 Chronic diastolic (congestive) heart failure: Secondary | ICD-10-CM | POA: Insufficient documentation

## 2020-04-10 DIAGNOSIS — I110 Hypertensive heart disease with heart failure: Secondary | ICD-10-CM | POA: Diagnosis not present

## 2020-04-10 DIAGNOSIS — R0600 Dyspnea, unspecified: Secondary | ICD-10-CM | POA: Diagnosis not present

## 2020-04-10 DIAGNOSIS — I517 Cardiomegaly: Secondary | ICD-10-CM | POA: Diagnosis not present

## 2020-04-10 DIAGNOSIS — F17210 Nicotine dependence, cigarettes, uncomplicated: Secondary | ICD-10-CM | POA: Insufficient documentation

## 2020-04-10 LAB — CBC
HCT: 40.5 % (ref 36.0–46.0)
Hemoglobin: 12.7 g/dL (ref 12.0–15.0)
MCH: 26.3 pg (ref 26.0–34.0)
MCHC: 31.4 g/dL (ref 30.0–36.0)
MCV: 83.9 fL (ref 80.0–100.0)
Platelets: 206 10*3/uL (ref 150–400)
RBC: 4.83 MIL/uL (ref 3.87–5.11)
RDW: 15 % (ref 11.5–15.5)
WBC: 10.8 10*3/uL — ABNORMAL HIGH (ref 4.0–10.5)
nRBC: 0 % (ref 0.0–0.2)

## 2020-04-10 LAB — BASIC METABOLIC PANEL
Anion gap: 8 (ref 5–15)
BUN: 13 mg/dL (ref 6–20)
CO2: 22 mmol/L (ref 22–32)
Calcium: 9 mg/dL (ref 8.9–10.3)
Chloride: 108 mmol/L (ref 98–111)
Creatinine, Ser: 0.99 mg/dL (ref 0.44–1.00)
GFR calc Af Amer: >60 mL/min (ref >60)
GFR calc non Af Amer: >60 mL/min (ref >60)
Glucose, Bld: 143 mg/dL — ABNORMAL HIGH (ref 70–99)
Potassium: 3.5 mmol/L (ref 3.5–5.1)
Sodium: 138 mmol/L (ref 135–145)

## 2020-04-10 LAB — SARS CORONAVIRUS 2 BY RT PCR (HOSPITAL ORDER, PERFORMED IN ~~LOC~~ HOSPITAL LAB): SARS Coronavirus 2: NEGATIVE

## 2020-04-10 LAB — I-STAT BETA HCG BLOOD, ED (MC, WL, AP ONLY): I-stat hCG, quantitative: <5 m[IU]/mL (ref <5)

## 2020-04-10 LAB — BRAIN NATRIURETIC PEPTIDE: B Natriuretic Peptide: 339.9 pg/mL — ABNORMAL HIGH (ref 0.0–100.0)

## 2020-04-10 LAB — TROPONIN I (HIGH SENSITIVITY)
Troponin I (High Sensitivity): 40 ng/L — ABNORMAL HIGH (ref <18)
Troponin I (High Sensitivity): 39 ng/L — ABNORMAL HIGH (ref <18)

## 2020-04-10 MED ORDER — SODIUM CHLORIDE 0.9% FLUSH
3.0000 mL | Freq: Once | INTRAVENOUS | Status: AC
  Administered 2020-04-10: 3 mL via INTRAVENOUS

## 2020-04-10 MED ORDER — DILTIAZEM HCL ER COATED BEADS 300 MG PO CP24
300.0000 mg | ORAL_CAPSULE | Freq: Once | ORAL | Status: AC
  Administered 2020-04-10: 300 mg via ORAL
  Filled 2020-04-10: qty 1

## 2020-04-10 MED ORDER — FUROSEMIDE 10 MG/ML IJ SOLN
20.0000 mg | Freq: Once | INTRAMUSCULAR | Status: AC
  Administered 2020-04-10: 20 mg via INTRAVENOUS
  Filled 2020-04-10: qty 2

## 2020-04-10 MED ORDER — DILTIAZEM HCL-DEXTROSE 125-5 MG/125ML-% IV SOLN (PREMIX)
5.0000 mg/h | INTRAVENOUS | Status: DC
  Administered 2020-04-10: 5 mg/h via INTRAVENOUS
  Filled 2020-04-10: qty 125

## 2020-04-10 MED ORDER — METOPROLOL TARTRATE 25 MG PO TABS
12.5000 mg | ORAL_TABLET | Freq: Once | ORAL | Status: AC
  Administered 2020-04-10: 12.5 mg via ORAL
  Filled 2020-04-10: qty 1

## 2020-04-10 MED ORDER — DILTIAZEM LOAD VIA INFUSION
20.0000 mg | Freq: Once | INTRAVENOUS | Status: AC
  Administered 2020-04-10: 20 mg via INTRAVENOUS
  Filled 2020-04-10: qty 20

## 2020-04-10 MED ORDER — METOPROLOL TARTRATE 25 MG PO TABS: 25.0000 mg | ORAL_TABLET | Freq: Two times a day (BID) | ORAL | 6 refills | Status: DC

## 2020-04-10 MED FILL — METOPROLOL TARTRATE 25 MG T: 25 | 90 days supply | Qty: 180 | Fill #0

## 2020-04-10 NOTE — ED Notes (Signed)
Cardiology at bedside.

## 2020-04-10 NOTE — ED Notes (Signed)
Pt in xray

## 2020-04-10 NOTE — ED Triage Notes (Signed)
Pt states last night she began to feel a "rattling in her chest and trouble breathing while lying flat". Pt has hx of afib and noted to be 160 in triage.

## 2020-04-10 NOTE — Progress Notes (Addendum)
   Dr Gwenlyn Found reviewed all data.  Pt HR/BP are better controlled now.   > 1L UOP after IV Lasix, O2 sats 96% on room air.   She has had her home dose of Cardizem, and the increased dose of metoprolol.   No further inpt workup indicated.   She is ok for d/c from a cardiac standpoint.   Has f/u scheduled in the Afib clinic on 08/02.   Rosaria Ferries, PA-C 04/10/2020 2:30 PM

## 2020-04-10 NOTE — ED Provider Notes (Signed)
[facility] EMERGENCY DEPARTMENT Provider Note   CSN: [id number] Arrival date & time: 04/10/20  0857     History Chief Complaint  Patient presents with  . Shortness of Breath    [name] is a 54 y.o. female.  HPI Patient presents with a right only in her chest and trouble breathing.  Worse lying flat.  History of CHF and atrial fibrillation.  She is in chronic A. fib.  States she has not had her medicines this morning because she came here.  Feels as if she could be a little dehydrated.  Has had a cough without sputum production.  No chest pain.  States she can feel her heart going fast this morning but did not feel going fast last night.  No fevers or chills.  She is on Xarelto but did not take it this morning.  She has had her Covid vaccines.    Past Medical History:  Diagnosis Date  . Clotting disorder ([place])    on xarelto  . Essential hypertension   . Heart murmur    a. 10/2015 Echo: EF 60-65%, no rwma, mild AI/MR, sev dil LA/RA, PASP 71mHg.  .Marland KitchenNoncompliance   . NSVT (nonsustained ventricular tachycardia) [place]    a. 10/2015 during admission for CVA/AF.  .Marland KitchenParoxysmal A-fib ([place]    a. CHA2DS2VASC = 4-->Xarelto;  b. 02/2016 Successful DCCV.  .Marland KitchenPneumonia 2012 x 2; 2015  . Stroke ([facility]    a. 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  . Transient ischemic attack (TIA) 01/2013    Patient Active Problem List   Diagnosis Date Noted  . Edema leg 05/17/2019  . Severe obesity (BMI 35.0-39.9) with comorbidity ([place] 04/04/2019  . Prediabetes 04/04/2019  . Seasonal allergic rhinitis due to pollen 12/14/2018  . Cough 12/14/2018  . Chronic back pain 08/23/2018  . Chronic diastolic CHF (congestive heart failure) ([place] 06/23/2018  . OSA on CPAP 06/23/2018  . Acute ischemic right MCA stroke ([place] 02/08/2018  . Headache 01/29/2017  . Refractive errors 04/15/2016  . Medical  non-compliance 11/06/2015  . Hyperlipidemia LDL goal <70 11/06/2015  . 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 NSVT (nonsustained ventricular tachycardia) ([place] 11/06/2015  . History of CVA (cerebrovascular accident)   . Claudication of both lower extremities ([place] 03/22/2015  . Chronic atrial fibrillation ([place] 04/12/2014  . Pap smear for cervical cancer screening 03/28/2014  . Cigarette smoker 06/15/2013  . Ventral hernia 06/15/2013  . Chronic anticoagulation 02/28/2013  . Hx-TIA (transient ischemic attack) 02/05/13 02/15/2013  . Essential hypertension 02/15/2013    Past Surgical History:  Procedure Laterality Date  . CARDIOVERSION N/A 02/18/2013   Procedure: CARDIOVERSION;  Surgeon: MSanda Klein MD;  [place];  Service: Cardiovascular;  Laterality: N/A;  . CARDIOVERSION N/A 02/23/2013   Procedure: CARDIOVERSION;  Surgeon: DLeonie Man MD;  [place]  Service: Cardiovascular;  Laterality: N/A;  BESIDE CV  . CARDIOVERSION N/A 03/12/2016   Procedure: CARDIOVERSION;  Surgeon: DLarey Dresser MD;  [place]  Service: Cardiovascular;  Laterality: N/A;  . TEE WITHOUT CARDIOVERSION N/A 02/18/2013   Procedure: TRANSESOPHAGEAL ECHOCARDIOGRAM (TEE);  Surgeon: MSanda Klein MD;  [facility];  Service: Cardiovascular;  Laterality: N/A;  . TUBAL LIGATION  1992     OB History   No obstetric history on file.     Family History  Problem Relation Age of Onset  . Cancer Mother   .  Atrial fibrillation Mother   . Breast cancer Mother   . Hypertension Father   . Atrial fibrillation Father   . Peripheral vascular disease Father   . Hypertension Other   . Diabetes Other   . Heart attack Neg Hx   . Stroke Neg Hx   . Rectal cancer Neg Hx   . Colon cancer Neg Hx   . Esophageal cancer Neg Hx   . Stomach cancer Neg Hx     Social History   Tobacco Use  . Smoking status: Former Smoker    Years: 3.00    Types: Cigarettes    Quit date:  03/15/2013    Years since quitting: 7.0  . Smokeless tobacco: Never Used  Vaping Use  . Vaping Use: Never used  Substance Use Topics  . Alcohol use: Yes    Comment: drinks on the weekends, 2 drinks and 2 beers  . Drug use: No    Home Medications Prior to Admission medications   Medication Sig Start Date End Date Taking? Authorizing Provider  acetaminophen (TYLENOL) 500 MG tablet Take 1,000 mg by mouth every 6 (six) hours as needed for headache.    Yes [provider]  albuterol (PROVENTIL HFA;VENTOLIN HFA) 108 (90 Base) MCG/ACT inhaler Inhale 2 puffs into the lungs every 6 (six) hours as needed for wheezing or shortness of breath. 11/17/18  Yes [name], MD  atorvastatin (LIPITOR) 80 MG tablet Take 1 tablet (80 mg total) by mouth daily at 6 PM. 01/18/20  Yes Newlin, Enobong, MD  butalbital-acetaminophen-caffeine (FIORICET) 50-325-40 MG tablet TAKE ONE TABLET EVERY 12 HOURS AS NEEDED 11/02/19  Yes [name], MD  cetirizine (ZYRTEC) 10 MG tablet Take 1 tablet (10 mg total) by mouth daily. 12/14/18  Yes [name] Stain, MD  diclofenac sodium (VOLTAREN) 1 % GEL Apply 4 g topically 4 (four) times daily. 05/02/19  Yes [name], MD  diltiazem (CARDIZEM CD) 300 MG 24 hr capsule Take 1 capsule (300 mg total) by mouth daily. 01/18/20 04/10/20 Yes Newlin, Charlane Ferretti, MD  fluticasone (FLONASE) 50 MCG/ACT nasal spray Place 2 sprays into both nostrils daily. 12/14/18  Yes [name] Stain, MD  furosemide (LASIX) 20 MG tablet Take 1 tablet (20 mg total) by mouth as needed. 02/08/20  Yes Kilroy, Luke K, PA-C  gabapentin (NEURONTIN) 300 MG capsule TAKE 1 CAPSULE BY MOUTH 2 TIMES DAILY. Patient taking differently: Take 300 mg by mouth 2 (two) times daily.  11/04/19  Yes Newlin, Charlane Ferretti, MD  hydrochlorothiazide (MICROZIDE) 12.5 MG capsule TAKE 1 CAPSULE (12.5 MG TOTAL) BY MOUTH DAILY. 09/07/19 04/10/20 Yes Kilroy, Doreene Burke, PA-C  metFORMIN (GLUCOPHAGE) 500 MG tablet Take 1 tablet (500 mg total) by  mouth 2 (two) times daily with a meal. 01/18/20  Yes Newlin, Enobong, MD  methocarbamol (ROBAXIN) 500 MG tablet Take 1 tablet (500 mg total) by mouth every 8 (eight) hours as needed for muscle spasms. 10/20/19  Yes [name], MD  rivaroxaban (XARELTO) 20 MG TABS tablet Take 1 tablet (20 mg total) by mouth daily with supper. 01/18/20  Yes [name], MD  Accu-Chek FastClix Lancets MISC USE AS INSTRUCTED TO CHECK BLOOD SUGAR ONCE DAILY. 03/07/20   [name], MD  ACCU-CHEK GUIDE test strip USE AS INSTRUCTED TO CHECK BLOOD SUGAR ONCE DAILY. 03/13/20   [name], MD  Blood Glucose Monitoring Suppl (ACCU-CHEK GUIDE ME) w/Device KIT 1 kit by Does not apply route daily. 05/02/19   [name], MD  metoprolol tartrate (LOPRESSOR) 25  MG tablet Take 1 tablet (25 mg total) by mouth 2 (two) times daily. 04/10/20 05/10/20  Barrett, Evelene Croon, PA-C  Misc. Devices MISC Blood pressure monitor.  Diagnosis Hypertension 02/11/19   [name], MD  Vitamin D, Ergocalciferol, (DRISDOL) 1.25 MG (50000 UT) CAPS capsule TAKE 1 CAPSULE (50,000 UNITS TOTAL) BY MOUTH ONCE A WEEK. Patient not taking: Reported on 04/10/2020 06/20/19   [name], MD    Allergies    Penicillins  Review of Systems   Review of Systems  Constitutional: Negative for appetite change, fatigue and fever.  HENT: Negative for congestion.   Respiratory: Positive for cough and shortness of breath.   Cardiovascular: Positive for palpitations. Negative for chest pain.  Gastrointestinal: Negative for abdominal pain.  Genitourinary: Negative for flank pain.  Musculoskeletal: Negative for back pain.  Skin: Negative for rash.  Neurological: Negative for weakness.  Psychiatric/Behavioral: Negative for confusion.    Physical Exam Updated Vital Signs BP (!) 135/92   Pulse 89   Temp 98.3 F (36.8 C) (Oral)   Resp (!) 30   Ht _0  (1.626 m)   Wt (!) 99.8 kg   SpO2 96%   BMI 37.76 kg/m   Physical Exam Vitals reviewed.    HENT:     Head: Atraumatic.  Cardiovascular:     Rate and Rhythm: Tachycardia present. Rhythm irregular.  Pulmonary:     Effort: No respiratory distress.     Comments: Mild rales at the bases. Chest:     Chest wall: No tenderness.  Musculoskeletal:     Cervical back: Neck supple.     Right lower leg: Edema present.     Left lower leg: Edema present.     Comments: Pitting edema bilateral lower extremities.  Skin:    Capillary Refill: Capillary refill takes less than 2 seconds.  Neurological:     Mental Status: She is alert and oriented to person, place, and time.     ED Results / Procedures / Treatments   Labs (all labs ordered are listed, but only abnormal results are displayed) Labs Reviewed  BASIC METABOLIC PANEL - Abnormal; Notable for the following components:      Result Value   Glucose, Bld 143 (*)    All other components within normal limits  CBC - Abnormal; Notable for the following components:   WBC 10.8 (*)    All other components within normal limits  BRAIN NATRIURETIC PEPTIDE - Abnormal; Notable for the following components:   B Natriuretic Peptide 339.9 (*)    All other components within normal limits  TROPONIN I (HIGH SENSITIVITY) - Abnormal; Notable for the following components:   Troponin I (High Sensitivity) 40 (*)    All other components within normal limits  TROPONIN I (HIGH SENSITIVITY) - Abnormal; Notable for the following components:   Troponin I (High Sensitivity) 39 (*)    All other components within normal limits  SARS CORONAVIRUS 2 BY RT PCR (HOSPITAL ORDER, [place] [facility])  I-STAT BETA HCG BLOOD, ED ([facility], [facility], [facility] ONLY)    EKG EKG Interpretation  Date/Time:  Tuesday April 10 2020 09:06:09 EDT Ventricular Rate:  157 PR Interval:    QRS Duration: 76 QT Interval:  250 QTC Calculation: 404 R Axis:   -22 Text Interpretation: Atrial fibrillation with rapid ventricular response Septal infarct , age undetermined Abnormal  ECG Confirmed by Davonna Belling [phone]) on 04/10/2020 9:46:27 AM Also confirmed by Davonna Belling [phone])  on 04/10/2020 9:47:00 AM  Radiology DG Chest 2 View  Result Date: 04/10/2020 CLINICAL DATA:  Cough, dyspnea. EXAM: CHEST - 2 VIEW COMPARISON:  April 21, 2019. FINDINGS: Stable cardiomegaly is noted with mild central pulmonary vascular congestion. No pneumothorax or pleural effusion is noted. Both lungs are clear. The visualized skeletal structures are unremarkable. IMPRESSION: Stable cardiomegaly with mild central pulmonary vascular congestion. Electronically Signed   By: Marijo Conception M.D.   On: 04/10/2020 09:44    Procedures Procedures (including critical care time)  Medications Ordered in ED Medications  diltiazem (CARDIZEM) 1 mg/mL load via infusion 20 mg (20 mg Intravenous Bolus from Bag 04/10/20 1017)    And  diltiazem (CARDIZEM) 125 mg in dextrose 5% 125 mL (1 mg/mL) infusion (0 mg/hr Intravenous Stopped 04/10/20 1437)  sodium chloride flush (NS) 0.9 % injection 3 mL (3 mLs Intravenous Given 04/10/20 0954)  furosemide (LASIX) injection 20 mg (20 mg Intravenous Given 04/10/20 1013)  diltiazem (CARDIZEM CD) 24 hr capsule 300 mg (300 mg Oral Given 04/10/20 1108)  metoprolol tartrate (LOPRESSOR) tablet 12.5 mg (12.5 mg Oral Given 04/10/20 1108)  furosemide (LASIX) injection 20 mg (20 mg Intravenous Given 04/10/20 1249)  metoprolol tartrate (LOPRESSOR) tablet 12.5 mg (12.5 mg Oral Given 04/10/20 1250)    ED Course  I have reviewed the triage vital signs and the nursing notes.  Pertinent labs & imaging results that were available during my care of the patient were reviewed by me and considered in my medical decision making (see chart for details).    MDM Rules/Calculators/A&P                         Chadsvasc 5 Patient presents with shortness of breath.  Worse laying down.  Rattling in her chest.  X-ray shows some pulmonary vascular congestion.  Has edema on her legs.  Also  found to be in A. fib with RVR.  History of A. fib and is currently anticoagulated.  Chronic A. fib but not a cardioversion candidate.  However with A. fib requiring IV Cardizem for rate control will require admission to the hospital.  Will discuss with cardiology.  Small dose of Lasix given due to apparent volume overload. CRITICAL CARE Performed by: Davonna Belling Total critical care time: 30 minutes Critical care time was exclusive of separately billable procedures and treating other patients. Critical care was necessary to treat or prevent imminent or life-threatening deterioration. Critical care was time spent personally by me on the following activities: development of treatment plan with patient and/or surrogate as well as nursing, discussions with consultants, evaluation of patient's response to treatment, examination of patient, obtaining history from patient or surrogate, ordering and performing treatments and interventions, ordering and review of laboratory studies, ordering and review of radiographic studies, pulse oximetry and re-evaluation of patient's condition.  Patient has now been seen by cardiology.  Rate controlled with oral medications and off the Cardizem drip.  Follow-up has been arranged.  Has had Lasix.  Prescriptions given.  Follow-up arranged with A. fib clinic.  Discharge home.  Final Clinical Impression(s) / ED Diagnoses Final diagnoses:  Atrial fibrillation with rapid ventricular response ([place])    Rx / DC Orders ED Discharge Orders         Ordered    metoprolol tartrate (LOPRESSOR) 25 MG tablet  2 times daily     Discontinue  Reprint     04/10/20 1433           Davonna Belling,  MD 04/10/20 1438

## 2020-04-10 NOTE — H&P (Addendum)
Cardiology Consultation:   Patient ID: [name]; [id number]; Jul 06, 1966   Admit date: 04/10/2020 Date of Consult: 04/10/2020  Primary Care Provider: [name], MD Primary Cardiologist: Sanda Klein, MD 04/04/2019 Primary Electrophysiologist:  None   Patient Profile:   [name] is a 54 y.o. 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  History of Present Illness:   [name] was in her usual state of health yesterday.  She had a client yesterday and was able to go grocery store after spending time with her client.  She had a [employer] sandwich for lunch with some chips although she did not eat many of the chips.  In the evening, she had 2 pork chops and went to the bowling alley with her granddaughter for a birthday party.  She felt well during this.  Today, she woke up and she felt that her heart was going to fast and also had some shortness of breath.  Shortness of breath is what made her come to the emergency room.  She is not short of breath at rest, but does not feel like she can do very much without feeling short of breath.  She denies lower extremity edema, no orthopnea or PND.  She is in atrial fibrillation all the time.  Today, she is more aware of her heart rate than usual.  She has days like that, most of the time, she can tell she is out of rhythm but it does not bother her.  She does not have a set of scales, has not been weighing.  She is compliant with her medications and says has not missed any doses of her Xarelto.   Past Medical History:  Diagnosis Date   Clotting disorder ([place])    on xarelto   Essential hypertension    Heart murmur    a. 10/2015 Echo: EF 60-65%, no rwma, mild AI/MR, sev dil LA/RA, PASP 60mHg.   Noncompliance    NS[place] ([place]onsustained ventricular  tachycardia) [place]    a. 10/2015 during admission for CVA/AF.   Paroxysmal [place]-fib ([place]    a. CHA2DS2VASC = 4-->Xarelto;  b. 02/2016 Successful DCCV.   Pneumonia 2012 x 2; 2015   Stroke ([facility]    a. 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   Transient ischemic attack (TIA) 01/2013    Past Surgical History:  Procedure Laterality Date   CARDIOVERSION N/A 02/18/2013   Procedure: CARDIOVERSION;  Surgeon: MSanda Klein MD;  [place] [facility]  Service: Cardiovascular;  Laterality: N/A;   CARDIOVERSION N/A 02/23/2013   Procedure: CARDIOVERSION;  Surgeon: DLeonie Man MD;  [place] [facility]  Service: Cardiovascular;  Laterality: N/A;  [place]   CARDIOVERSION N/A 03/12/2016   Procedure: CARDIOVERSION;  Surgeon: DLarey Dresser MD;  [place]  Service: Cardiovascular;  Laterality: N/A;   TEE WITHOUT CARDIOVERSION N/A 02/18/2013   Procedure: TRANSESOPHAGEAL ECHOCARDIOGRAM (TEE);  Surgeon: MSanda Klein MD;  [facility];  Service: Cardiovascular;  Laterality: N/A;   [place]    Prior to Admission medications   Medication Sig Start Date End Date Taking? Authorizing Provider  acetaminophen (TYLENOL) 500 MG tablet Take 1,000 mg by mouth every 6 (six) hours as needed for headache.    Yes [provider]  albuterol (PROVENTIL HFA;VENTOLIN HFA) 108 (90 Base) MCG/ACT inhaler Inhale 2 puffs into the lungs every 6 (six) hours as needed for wheezing or shortness of breath. 11/17/18  Yes Elizabeth Rakes, MD  atorvastatin (LIPITOR) 80 MG tablet Take 1 tablet (80 mg total) by mouth daily at 6 PM. 01/18/20  Yes Newlin, Enobong, MD  butalbital-acetaminophen-caffeine (FIORICET) 50-325-40 MG tablet TAKE ONE TABLET EVERY 12 HOURS AS NEEDED 11/02/19  Yes Elizabeth Rakes, MD  cetirizine (ZYRTEC) 10 MG tablet Take 1 tablet (10 mg total) by mouth daily. 12/14/18  Yes [name] Stain, MD    diclofenac sodium (VOLTAREN) 1 % GEL Apply 4 g topically 4 (four) times daily. 05/02/19  Yes Elizabeth Rakes, MD  diltiazem (CARDIZEM CD) 300 MG 24 hr capsule Take 1 capsule (300 mg total) by mouth daily. 01/18/20 04/10/20 Yes Newlin, Charlane Ferretti, MD  fluticasone (FLONASE) 50 MCG/ACT nasal spray Place 2 sprays into both nostrils daily. 12/14/18  Yes [name] Stain, MD  furosemide (LASIX) 20 MG tablet Take 1 tablet (20 mg total) by mouth as needed. 02/08/20  Yes Kilroy, Luke K, PA-C  gabapentin (NEURONTIN) 300 MG capsule TAKE 1 CAPSULE BY MOUTH 2 TIMES DAILY. Patient taking differently: Take 300 mg by mouth 2 (two) times daily.  11/04/19  Yes Newlin, Charlane Ferretti, MD  hydrochlorothiazide (MICROZIDE) 12.5 MG capsule TAKE 1 CAPSULE (12.5 MG TOTAL) BY MOUTH DAILY. 09/07/19 04/10/20 Yes Kilroy, Doreene Burke, PA-C  metFORMIN (GLUCOPHAGE) 500 MG tablet Take 1 tablet (500 mg total) by mouth 2 (two) times daily with a meal. 01/18/20  Yes Newlin, Enobong, MD  methocarbamol (ROBAXIN) 500 MG tablet Take 1 tablet (500 mg total) by mouth every 8 (eight) hours as needed for muscle spasms. 10/20/19  Yes Elizabeth Rakes, MD  metoprolol tartrate (LOPRESSOR) 25 MG tablet Take 0.5 tablets (12.5 mg total) by mouth 2 (two) times daily. 01/18/20 04/10/20 Yes Elizabeth Rakes, MD  rivaroxaban (XARELTO) 20 MG TABS tablet Take 1 tablet (20 mg total) by mouth daily with supper. 01/18/20  Yes Elizabeth Rakes, MD  Accu-Chek FastClix Lancets MISC USE AS INSTRUCTED TO CHECK BLOOD SUGAR ONCE DAILY. 03/07/20   Elizabeth Rakes, MD  ACCU-CHEK GUIDE test strip USE AS INSTRUCTED TO CHECK BLOOD SUGAR ONCE DAILY. 03/13/20   Elizabeth Rakes, MD  Blood Glucose Monitoring Suppl (ACCU-CHEK GUIDE ME) w/Device KIT 1 kit by Does not apply route daily. 05/02/19   Elizabeth Rakes, MD  Misc. Devices MISC Blood pressure monitor.  Diagnosis Hypertension 02/11/19   Elizabeth Rakes, MD  Vitamin D, Ergocalciferol, (DRISDOL) 1.25 MG (50000 UT) CAPS capsule TAKE 1 CAPSULE (50,000 UNITS  TOTAL) BY MOUTH ONCE A WEEK. Patient not taking: Reported on 04/10/2020 06/20/19   Elizabeth Rakes, MD    Inpatient Medications: Scheduled Meds:   Continuous Infusions:  sodium chloride     diltiazem (CARDIZEM) infusion 5 mg/hr (04/10/20 1016)   PRN Meds:   Allergies:    Allergies  Allergen Reactions   Penicillins Hives and Other (See Comments)    Unknown  Has patient had a PCN reaction causing immediate rash, facial/tongue/throat swelling, SOB or lightheadedness with hypotension: No Has patient had a PCN reaction causing severe rash involving mucus membranes or skin necrosis: No Has patient had a PCN reaction that required hospitalization No Has patient had a PCN reaction occurring within the last 10 years: No If all of the above answers are "NO", then may proceed with Cephalosporin use.    Social History:   Social History  Socioeconomic History   Marital status: Single    Spouse name: Not on file   Number of children: Not on file   Years of education: Not on file   Highest education level: Not on file  Occupational History   Occupation: Training and development officer    Employer: [employer]   Occupation: in home health aide  Tobacco Use   Smoking status: Former Smoker    Years: 3.00    Types: Cigarettes    Quit date: 03/15/2013    Years since quitting: 7.0   Smokeless tobacco: Never Used  Vaping Use   Vaping Use: Never used  Substance and Sexual Activity   Alcohol use: Yes    Comment: drinks on the weekends, 2 drinks and 2 beers   Drug use: No   Sexual activity: Not Currently    Birth control/protection: None  Other Topics Concern   Not on file  Social History Narrative   Not on file   Social Determinants of Health   Financial Resource Strain:    Difficulty of Paying Living Expenses:   Food Insecurity:    Worried About Charity fundraiser in the Last Year:    Arboriculturist in the Last Year:   Transportation Needs:    Film/video editor (Medical):    Lack  of Transportation (Non-Medical):   Physical Activity:    Days of Exercise per Week:    Minutes of Exercise per Session:   Stress:    Feeling of Stress :   Social Connections:    Frequency of Communication with Friends and Family:    Frequency of Social Gatherings with Friends and Family:    Attends Religious Services:    Active Member of Clubs or Organizations:    Attends Music therapist:    Marital Status:   Intimate Partner Violence:    Fear of Current or Ex-Partner:    Emotionally Abused:    Physically Abused:    Sexually Abused:     Family History:   Family History  Problem Relation Age of Onset   Cancer Mother    Atrial fibrillation Mother    Breast cancer Mother    Hypertension Father    Atrial fibrillation Father    Peripheral vascular disease Father    Hypertension Other    Diabetes Other    Heart attack Neg Hx    Stroke Neg Hx    Rectal cancer Neg Hx    Colon cancer Neg Hx    Esophageal cancer Neg Hx    Stomach cancer Neg Hx    Family Status:  Family Status  Relation Name Status   Mother  Alive   Father  Deceased   Other  (Not Specified)   MGM  Deceased   MGF  Deceased   PGM  Deceased   PGF  Deceased   Neg Hx  (Not Specified)    ROS:  Please see the history of present illness.  All other ROS reviewed and negative.     Physical Exam/Data:   Vitals:   04/10/20 1027 04/10/20 1038 04/10/20 1051 04/10/20 1113  BP:      Pulse: 92 98 98 101  Resp: '23 16 18 '$ (!) 25  Temp:      TempSrc:      SpO2: 92% 96% 96% 96%  Weight:      Height:        Intake/Output Summary (Last 24 hours) at 04/10/2020 1147 Last data filed at  04/10/2020 1113 Gross per 24 hour  Intake --  Output 750 ml  Net -750 ml    Last 3 Weights 04/10/2020 03/22/2020 01/18/2020  Weight (lbs) 220 lb 220 lb 219 lb  Weight (kg) 99.791 kg 99.791 kg 99.338 kg     Body mass index is 37.76 kg/m.   General:  Well nourished, well developed, female in no acute distress HEENT:  normal Lymph: no adenopathy Neck: JVD -11 cm Endocrine:  No thryomegaly Vascular: No carotid bruits; 4/4 extremity pulses 2+  Cardiac:  normal S1, S2; irregular rate and rhythm; no murmur Lungs: Decreased breath sounds bases bilaterally, no wheezing, rhonchi or rales  Abd: soft, nontender, no hepatomegaly  Ext: no edema Musculoskeletal:  No deformities, BUE and BLE strength normal and equal Skin: warm and dry  Neuro:  CNs 2-12 intact, no focal abnormalities noted Psych:  Normal affect   EKG:  The EKG was personally reviewed and demonstrates: Atrial fibrillation, heart rate 157 Telemetry:  Telemetry was personally reviewed and demonstrates: Atrial fibrillation, heart rate control improving on IV diltiazem   CV studies:   ECHO: 05/09/2019  1. The left ventricle has normal systolic function with an ejection  fraction of 60-65%. The cavity size was normal. There is mildly increased  left ventricular wall thickness. not assessed due to atrial flutter. No  evidence of left ventricular regional  wall motion abnormalities.   2. Left atrial size was moderately dilated.   3. The mitral valve is abnormal. Mild thickening of the mitral valve  leaflet.   4. The tricuspid valve is grossly normal.   5. The aortic valve is tricuspid. Mild sclerosis of the aortic valve.  Aortic valve regurgitation is mild by color flow Doppler. No stenosis of  the aortic valve.   6. The aorta is normal unless otherwise noted.   7. The inferior vena cava was dilated in size with >50% respiratory  variability.   8. When compared to the prior study: 02/09/2018: LVEF 60-65%, mild AI,  possible LA thrombus.   Laboratory Data:   Chemistry Recent Labs  Lab 04/10/20 0924  NA 138  K 3.5  CL 108  CO2 22  GLUCOSE 143*  BUN 13  CREATININE 0.99  CALCIUM 9.0  GFRNONAA >60  GFRAA >60  ANIONGAP 8    Lab Results  Component Value Date   ALT 12 10/21/2019   AST 17 10/21/2019   ALKPHOS 101 10/21/2019   BILITOT  0.5 10/21/2019   Hematology Recent Labs  Lab 04/10/20 0924  WBC 10.8*  RBC 4.83  HGB 12.7  HCT 40.5  MCV 83.9  MCH 26.3  MCHC 31.4  RDW 15.0  PLT 206   Cardiac Enzymes High Sensitivity Troponin:   Recent Labs  Lab 04/10/20 0924  TROPONINIHS 40*      BNPNo results for input(s): BNP, PROBNP in the last 168 hours.  DDimer No results for input(s): DDIMER in the last 168 hours. TSH:  Lab Results  Component Value Date   TSH 1.33 03/07/2016   Lipids: Lab Results  Component Value Date   CHOL 101 10/21/2019   HDL 36 (L) 10/21/2019   LDLCALC 49 10/21/2019   TRIG 79 10/21/2019   CHOLHDL 2.8 10/21/2019   HgbA1c: Lab Results  Component Value Date   HGBA1C 5.9 01/18/2020   Magnesium:  Magnesium  Date Value Ref Range Status  02/10/2018 2.0 1.7 - 2.4 mg/dL Final    Comment:    Performed at [facility],  [address] [place], [place] [address]     Radiology/Studies:  DG Chest 2 View  Result Date: 04/10/2020 CLINICAL DATA:  Cough, dyspnea. EXAM: CHEST - 2 VIEW COMPARISON:  April 21, 2019. FINDINGS: Stable cardiomegaly is noted with mild central pulmonary vascular congestion. No pneumothorax or pleural effusion is noted. Both lungs are clear. The visualized skeletal structures are unremarkable. IMPRESSION: Stable cardiomegaly with mild central pulmonary vascular congestion. Electronically Signed   By: Marijo Conception M.D.   On: 04/10/2020 09:44    Assessment and Plan:   1. Rapid atrial fib - BP is elevated, will uptitrate her metoprolol, see if that is enough - continue Dilt 300, early f/u in Afib clinic for further management. -Not completely clear reason for her heart rate to be elevated, but most likely is excess volume  2.  Volume overload: -EF was normal on echo last year, with no mention of diastolic dysfunction -She admits to dietary indiscretion yesterday, prior to her lunch and dinner, her activity level when breathing was normal. -Mild central  pulmonary vascular congestion on chest x-ray, she has had Lasix 20 mg IV.  Will give another 20 mg IV. -We will discuss with social worker getting her scales so she can weigh daily. -It may be that her Lasix dose needs to be 40 mg a day instead of 20  3.  Anticoagulation -Patient states she has not missed any doses of Xarelto -I have reached out to Dr. Sallyanne Kuster as I do not see any change in her medication after a possible left atrial thrombus was seen in echo last year.  Active Problems:   * No active hospital problems. *     For questions or updates, please contact [place] Please consult [email or web address] for contact info under Cardiology/STEMI.   Signed, Rosaria Ferries, PA-C  04/10/2020 11:47 AM  Agree with note by Rosaria Ferries PA-C  Ms. Dilley comes to the ER today with A. fib with RVR and mild volume overload secondary to dietary indiscretion.  She is a patient Dr. Victorino December.  She saw Kerin Ransom, PA-C last.  She is on chronic oral anticoagulation as well as beta-blocker and calcium channel blocker.  She does admit to eating to pork chops and a [employer] sandwich.  After that she developed some wheezing and dyspnea.  Today on exam she is mildly volume overloaded with increased JVP but no peripheral edema.  She does have mild edema on chest x-ray.  She will receive a total of 40 mg of IV Lasix and we will uptitrate her beta-blocker.  Her exam otherwise benign.  She otherwise stable for discharge with close outpatient follow-up.  We talked both importance of dietary modification.   Lorretta Harp, M.D., [place], [facility], [place] [address] [place], [place]  [address]  [phone] 04/10/2020 12:37 PM

## 2020-04-11 ENCOUNTER — Telehealth: Payer: Self-pay | Admitting: *Deleted

## 2020-04-11 NOTE — Telephone Encounter (Signed)
Medicaid Managed Care team Transition of Care Assessment outreach attempt #1 made today. Unable to reach patient. HIPPA compliant voice message left requesting a return call. The patient has also been enrolled in an automated discharge follow up call series and will receive two outreach attempts for transition of care assessment. Contact information has been left for the patient and the Medicaid Managed Care team is available to provide assistance to the patient at any time.  ° °Katrice Keighan Amezcua, RN, BSN, CCRN °Patient Engagement Center °[phone] ° °

## 2020-04-16 ENCOUNTER — Other Ambulatory Visit: Payer: Self-pay

## 2020-04-16 ENCOUNTER — Encounter (HOSPITAL_COMMUNITY): Payer: Self-pay | Admitting: Physician Assistant

## 2020-04-16 ENCOUNTER — Ambulatory Visit (HOSPITAL_COMMUNITY)
Admit: 2020-04-16 | Discharge: 2020-04-16 | Disposition: A | Discharge status: Home / Self Care | Payer: Medicaid Other | Source: Ambulatory Visit | Attending: Physician Assistant | Admitting: Physician Assistant

## 2020-04-16 VITALS — BP 126/76 | HR 70 | Ht 64.0 in | Wt 218.8 lb

## 2020-04-16 DIAGNOSIS — Z880 Allergy status to penicillin: Secondary | ICD-10-CM | POA: Insufficient documentation

## 2020-04-16 DIAGNOSIS — Z7984 Long term (current) use of oral hypoglycemic drugs: Secondary | ICD-10-CM | POA: Diagnosis not present

## 2020-04-16 DIAGNOSIS — D6869 Other thrombophilia: Secondary | ICD-10-CM | POA: Diagnosis not present

## 2020-04-16 DIAGNOSIS — R011 Cardiac murmur, unspecified: Secondary | ICD-10-CM | POA: Diagnosis not present

## 2020-04-16 DIAGNOSIS — E669 Obesity, unspecified: Secondary | ICD-10-CM | POA: Insufficient documentation

## 2020-04-16 DIAGNOSIS — Z79899 Other long term (current) drug therapy: Secondary | ICD-10-CM | POA: Insufficient documentation

## 2020-04-16 DIAGNOSIS — I5032 Chronic diastolic (congestive) heart failure: Secondary | ICD-10-CM | POA: Insufficient documentation

## 2020-04-16 DIAGNOSIS — Z8673 Personal history of transient ischemic attack (TIA), and cerebral infarction without residual deficits: Secondary | ICD-10-CM | POA: Diagnosis not present

## 2020-04-16 DIAGNOSIS — I472 Ventricular tachycardia: Secondary | ICD-10-CM | POA: Insufficient documentation

## 2020-04-16 DIAGNOSIS — Z713 Dietary counseling and surveillance: Secondary | ICD-10-CM | POA: Insufficient documentation

## 2020-04-16 DIAGNOSIS — Z7951 Long term (current) use of inhaled steroids: Secondary | ICD-10-CM | POA: Insufficient documentation

## 2020-04-16 DIAGNOSIS — I110 Hypertensive heart disease with heart failure: Secondary | ICD-10-CM | POA: Insufficient documentation

## 2020-04-16 DIAGNOSIS — Z87891 Personal history of nicotine dependence: Secondary | ICD-10-CM | POA: Insufficient documentation

## 2020-04-16 DIAGNOSIS — Z7901 Long term (current) use of anticoagulants: Secondary | ICD-10-CM | POA: Insufficient documentation

## 2020-04-16 DIAGNOSIS — Z8249 Family history of ischemic heart disease and other diseases of the circulatory system: Secondary | ICD-10-CM | POA: Diagnosis not present

## 2020-04-16 DIAGNOSIS — I4811 Longstanding persistent atrial fibrillation: Secondary | ICD-10-CM | POA: Diagnosis not present

## 2020-04-16 DIAGNOSIS — Z6837 Body mass index (BMI) 37.0-37.9, adult: Secondary | ICD-10-CM | POA: Diagnosis not present

## 2020-04-16 NOTE — Progress Notes (Signed)
Thanks

## 2020-04-16 NOTE — Progress Notes (Signed)
Primary Care Physician: [name], MD Primary Cardiologist: Dr Sallyanne Kuster Primary Electrophysiologist: none Referring Physician: Dr Philbert Riser is a 54 y.o. female with a history of longstanding persistent atrial fibrillation, 2 prior CVAs, HTN, NSVT who presents for consultation in the [facility]. The patient underwent DCCV in 2017 but failed to stay in SR and has been in persistent afib since. She was seen at the ED 04/10/20 with SOB and was in afib with RVR with evidence of fluid overload. She received IV Lasix and her rate control was increased. Patient is on Xarelto for a CHADS2VASC score of 4. Patient reports she has done well since then. She denies awareness of her arrhythmia. She denies any bleeding issues on anticoagulation. She is rate controlled today.  Today, she denies symptoms of palpitations, chest pain, shortness of breath, orthopnea, PND, lower extremity edema, dizziness, presyncope, syncope, snoring, daytime somnolence, bleeding, or neurologic sequela. The patient is tolerating medications without difficulties and is otherwise without complaint today.    Atrial Fibrillation Risk Factors:  she does not have symptoms or diagnosis of sleep apnea. she does not have a history of rheumatic fever.   she has a BMI of Body mass index is 37.56 kg/m.[name] Filed Weights   04/16/20 0943  Weight: (!) 99.2 kg    Family History  Problem Relation Age of Onset   Cancer Mother    Atrial fibrillation Mother    Breast cancer Mother    Hypertension Father    Atrial fibrillation Father    Peripheral vascular disease Father    Hypertension Other    Diabetes Other    Heart attack Neg Hx    Stroke Neg Hx    Rectal cancer Neg Hx    Colon cancer Neg Hx    Esophageal cancer Neg Hx    Stomach cancer Neg Hx      Atrial Fibrillation Management history:  Previous antiarrhythmic drugs: none Previous cardioversions: 02/2013, 02/2013,  02/2016 Previous ablations: none CHADS2VASC score: 4 Anticoagulation history: Xarelto   Past Medical History:  Diagnosis Date   Clotting disorder ([place])    on xarelto   Essential hypertension    Heart murmur    a. 10/2015 Echo: EF 60-65%, no rwma, mild AI/MR, sev dil LA/RA, PASP 75mHg.   Noncompliance    [place] ([place]ustained [place]cardia) [place]    a. 10/2015 during admission for CVA/AF.   [place]    a. CHA2DS2VASC = 4-->Xarelto;  b. 02/2016 Successful DCCV.   Pneumonia 2012 x 2; 2015   Stroke ([facility]    a. 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   Transient ischemic attack (TIA) 01/2013   Past Surgical History:  Procedure Laterality Date   CARDIOVERSION N/A 02/18/2013   Procedure: CARDIOVERSION;  Surgeon: MSanda Klein MD;  [facility]  Service: Cardiovascular;  Laterality: N/A;   CARDIOVERSION N/A 02/23/2013   Procedure: CARDIOVERSION;  Surgeon: DLeonie Man MD;  [place] [facility]  Service: Cardiovascular;  Laterality: N/A;  [place]   CARDIOVERSION N/A 03/12/2016   Procedure: CARDIOVERSION;  Surgeon: DLarey Dresser MD;  [place] [facility]  Service: Cardiovascular;  Laterality: N/A;   TEE WITHOUT CARDIOVERSION N/A 02/18/2013   Procedure: TRANSESOPHAGEAL ECHOCARDIOGRAM (TEE);  Surgeon: MSanda Klein MD;  [facility];  Service: Cardiovascular;  Laterality: N/A;   TUBAL LIGATION  1992    Current Outpatient Medications  Medication Sig Dispense Refill   Accu-Chek FastClix Lancets MISC USE AS INSTRUCTED TO CHECK BLOOD SUGAR ONCE DAILY. 102 each 2   ACCU-CHEK GUIDE test strip USE AS INSTRUCTED TO CHECK BLOOD SUGAR ONCE DAILY. 100 strip 2   acetaminophen (TYLENOL) 500 MG tablet Take 1,000 mg by mouth every 6 (six) hours as needed for headache.      albuterol (PROVENTIL HFA;VENTOLIN HFA) 108 (90 Base) MCG/ACT inhaler Inhale 2 puffs  into the lungs every 6 (six) hours as needed for wheezing or shortness of breath. 1 Inhaler 2   atorvastatin (LIPITOR) 80 MG tablet Take 1 tablet (80 mg total) by mouth daily at 6 PM. 30 tablet 6   Blood Glucose Monitoring Suppl (ACCU-CHEK GUIDE ME) w/Device KIT 1 kit by Does not apply route daily. 1 kit 0   butalbital-acetaminophen-caffeine (FIORICET) 50-325-40 MG tablet TAKE ONE TABLET EVERY 12 HOURS AS NEEDED 40 tablet 1   cetirizine (ZYRTEC) 10 MG tablet Take 1 tablet (10 mg total) by mouth daily. 30 tablet 6   diclofenac sodium (VOLTAREN) 1 % GEL Apply 4 g topically 4 (four) times daily. 100 g 3   diltiazem (CARDIZEM CD) 300 MG 24 hr capsule Take 1 capsule (300 mg total) by mouth daily. 30 capsule 6   fluticasone (FLONASE) 50 MCG/ACT nasal spray Place 2 sprays into both nostrils daily. 16 g 6   furosemide (LASIX) 20 MG tablet Take 1 tablet (20 mg total) by mouth as needed. 30 tablet 2   gabapentin (NEURONTIN) 300 MG capsule TAKE 1 CAPSULE BY MOUTH 2 TIMES DAILY. (Patient taking differently: Take 300 mg by mouth 2 (two) times daily. ) 60 capsule 3   hydrochlorothiazide (MICROZIDE) 12.5 MG capsule TAKE 1 CAPSULE (12.5 MG TOTAL) BY MOUTH DAILY. 90 capsule 3   metFORMIN (GLUCOPHAGE) 500 MG tablet Take 1 tablet (500 mg total) by mouth 2 (two) times daily with a meal. 60 tablet 6   methocarbamol (ROBAXIN) 500 MG tablet Take 1 tablet (500 mg total) by mouth every 8 (eight) hours as needed for muscle spasms. 90 tablet 1   metoprolol tartrate (LOPRESSOR) 25 MG tablet Take 1 tablet (25 mg total) by mouth 2 (two) times daily. 60 tablet 6   Misc. Devices MISC Blood pressure monitor.  Diagnosis Hypertension 1 each 0   rivaroxaban (XARELTO) 20 MG TABS tablet Take 1 tablet (20 mg total) by mouth daily with supper. 30 tablet 6   Current Facility-Administered Medications  Medication Dose Route Frequency Provider Last Rate Last Admin   0.9 %  sodium chloride infusion  500 mL Intravenous Once  Armbruster, Carlota Raspberry, MD        Allergies  Allergen Reactions   Penicillins Hives and Other (See Comments)    Unknown  Has patient had a PCN reaction causing immediate rash, facial/tongue/throat swelling, SOB or lightheadedness with hypotension: No Has patient had a PCN reaction causing severe rash involving mucus membranes or skin necrosis: No Has patient had a PCN reaction that required hospitalization No Has patient had a PCN reaction occurring within the last 10 years: No If all of the above answers are "NO", then may proceed with Cephalosporin use.    Social History   Socioeconomic History   Marital status: Single    Spouse name: Not on file   Number of children: Not on file   Years of education: Not on file   Highest education level: Not on file  Occupational History   Occupation: [employer]  Employer: [employer]   Occupation: in home health aide  Tobacco Use   Smoking status: Former Smoker    Years: 3.00    Types: Cigarettes    Quit date: 03/15/2013    Years since quitting: 7.0   Smokeless tobacco: Never Used  Vaping Use   Vaping Use: Never used  Substance and Sexual Activity   Alcohol use: Yes    Comment: drinks on the weekends,   2-3 beers   Drug use: No   Sexual activity: Not Currently    Birth control/protection: None  Other Topics Concern   Not on file  Social History Narrative   Not on file   Social Determinants of Health   Financial Resource Strain:    Difficulty of Paying Living Expenses:   Food Insecurity:    Worried About Charity fundraiser in the Last Year:    Arboriculturist in the Last Year:   Transportation Needs:    Film/video editor (Medical):    Lack of Transportation (Non-Medical):   Physical Activity:    Days of Exercise per Week:    Minutes of Exercise per Session:   Stress:    Feeling of Stress :   Social Connections:    Frequency of Communication with Friends and Family:    Frequency of  Social Gatherings with Friends and Family:    Attends Religious Services:    Active Member of Clubs or Organizations:    Attends Music therapist:    Marital Status:   Intimate Partner Violence:    Fear of Current or Ex-Partner:    Emotionally Abused:    Physically Abused:    Sexually Abused:      ROS- All systems are reviewed and negative except as per the HPI above.  Physical Exam: Vitals:   04/16/20 0943  BP: 126/76  Pulse: 70  Weight: (!) 99.2 kg  Height: _0  (1.626 m)    GEN- The patient is well appearing obese female, alert and oriented x 3 today.   Head- normocephalic, atraumatic Eyes-  Sclera clear, conjunctiva pink Ears- hearing intact Oropharynx- clear Neck- supple  Lungs- Clear to ausculation bilaterally, normal work of breathing Heart- irregular rate and rhythm, no murmurs, rubs or gallops  GI- soft, NT, ND, + BS Extremities- no clubbing, cyanosis, or edema MS- no significant deformity or atrophy Skin- no rash or lesion Psych- euthymic mood, full affect Neuro- strength and sensation are intact  Wt Readings from Last 3 Encounters:  04/16/20 (!) 99.2 kg  04/10/20 (!) 99.8 kg  03/22/20 99.8 kg    EKG today demonstrates afib HR 70, NST, QRS 86, QTc 501  Echo 05/09/19 demonstrated  1. The left ventricle has normal systolic function with an ejection  fraction of 60-65%. The cavity size was normal. There is mildly increased  left ventricular wall thickness. not assessed due to atrial flutter. No  evidence of left ventricular regional  wall motion abnormalities.  2. Left atrial size was moderately dilated.  3. The mitral valve is abnormal. Mild thickening of the mitral valve  leaflet.  4. The tricuspid valve is grossly normal.  5. The aortic valve is tricuspid. Mild sclerosis of the aortic valve.  Aortic valve regurgitation is mild by color flow Doppler. No stenosis of  the aortic valve.  6. The aorta is normal unless  otherwise noted.  7. The inferior vena cava was dilated in size with >50% respiratory  variability.  8. When  compared to the prior study: 02/09/2018: LVEF 60-65%, mild AI,  possible LA thrombus.   SUMMARY    LVEF 60-65%, mild LVH, normal wall motion, moderate LAE, aortic  valve sclerosis with mild AI, dilated IVC that collapses   Epic records are reviewed at length today  CHA2DS2-VASc Score = 4  The patient's score is based upon: CHF History: 0 HTN History: 1 Age : 0 Diabetes History: 0 Stroke History: 2 Vascular Disease History: 0 Gender: 1  { This patient does not have a recorded CHADS2-VASc score.   Click here to calculate score.  Then [facility] your note.       :[id number]}    ASSESSMENT AND PLAN: 1. Longstanding Persistent Atrial Fibrillation (ICD10:  I48.11) The patient's CHA2DS2-VASc score is 4, indicating a 4.8% annual risk of stroke.   Will continue with rate control strategy.  Continue diltiazem 300 mg daily Continue Lopressor 25 mg BID Continue Xarelto 20 mg daily Encouraged patient to reach out to AF clinic if she notes prolonged elevated heart rates.  2. Secondary Hypercoagulable State (ICD10:  D68.69) The patient is at significant risk for stroke/thromboembolism based upon her CHA2DS2-VASc Score of 4.  Continue Rivaroxaban (Xarelto).   3. Obesity Body mass index is 37.56 kg/m. Lifestyle modification was discussed at length including regular exercise and weight reduction.  4. HTN Stable, no changes today.  5. Chronic diastolic CHF No signs or symptoms of fluid overload today.   Follow up with Dr Sallyanne Kuster in 2 months.    [facility] [address] [place],  [address] [phone] 04/16/2020 10:23 AM

## 2020-04-19 ENCOUNTER — Telehealth: Payer: Self-pay | Admitting: Family Medicine

## 2020-04-19 MED FILL — CARTIA XT 300 MG CAPSULE SA: 300 | 90 days supply | Qty: 90 | Fill #0

## 2020-04-19 MED FILL — METOPROLOL TARTRATE 25 MG T: 25 | 90 days supply | Qty: 180 | Fill #0

## 2020-04-19 NOTE — Telephone Encounter (Signed)
Returned call to patient and LVM to return call and schedule an appt with her PCP.   Copied from [place] [id number]. Topic: Appointment Scheduling - Scheduling Inquiry for Clinic >> Apr 11, 2020  3:01 PM [name] wrote: Reason for CRM: Patient was in the Emergency dept. On 7/27/21and will need a follow up with Dr Margarita Rana. There are no appointments available on her schedule .  She has a follow up with Dr Marlene Lard at the [place] [facility] on 04/16/20.

## 2020-06-14 MED FILL — HYDROCHLOROTHIAZIDE 12.5 MG: 12.5 | 90 days supply | Qty: 90 | Fill #1

## 2020-06-14 MED FILL — XARELTO 20 MG TABLET: 20 | 90 days supply | Qty: 90 | Fill #0

## 2020-06-15 ENCOUNTER — Ambulatory Visit (INDEPENDENT_AMBULATORY_CARE_PROVIDER_SITE_OTHER): Payer: Medicaid Other | Admitting: Cardiovascular Disease

## 2020-06-15 ENCOUNTER — Other Ambulatory Visit: Payer: Self-pay

## 2020-06-15 ENCOUNTER — Encounter: Payer: Self-pay | Admitting: Cardiovascular Disease

## 2020-06-15 VITALS — BP 146/82 | HR 82 | Ht 64.0 in | Wt 217.4 lb

## 2020-06-15 DIAGNOSIS — R7303 Prediabetes: Secondary | ICD-10-CM

## 2020-06-15 DIAGNOSIS — I5032 Chronic diastolic (congestive) heart failure: Secondary | ICD-10-CM | POA: Diagnosis not present

## 2020-06-15 DIAGNOSIS — E785 Hyperlipidemia, unspecified: Secondary | ICD-10-CM | POA: Diagnosis not present

## 2020-06-15 DIAGNOSIS — I4811 Longstanding persistent atrial fibrillation: Secondary | ICD-10-CM

## 2020-06-15 DIAGNOSIS — Z7901 Long term (current) use of anticoagulants: Secondary | ICD-10-CM | POA: Diagnosis not present

## 2020-06-15 NOTE — Progress Notes (Signed)
Cardiology Office Note:    Date:  06/15/2020   ID:  [name], DOB May 19, 1966, MRN [id number]  PCP:  Elizabeth Rakes, MD  Cardiologist:  Elizabeth Klein, MD  Electrophysiologist:  None   Referring MD: Elizabeth Rakes, MD   Chief Complaint  Patient presents with   Atrial Fibrillation   Congestive Heart Failure    History of Present Illness:    [name] is a 54 y.o. female with a hx of longstanding persistent atrial fibrillation, 2 previous episodes of stroke without residual deficits and essential hypertension.  She had early recurrence of atrial fibrillation after previous cardioversion and has biatrial enlargement, making it likely that she will have permanent arrhythmia.  She does not have any serious valvular problems and has normal left ventricular systolic function.  She does not have symptoms of obstructive sleep apnea, but is moderately obese.  She was seen in the emergency room on July 27 with shortness of breath due to atrial fibrillation with rapid ventricular response with evidence of fluid overload.  She had increased intake of salt before that hospitalization and had skipped a few days of her diuretic since she did not want to use the bathroom excessively while at work.  She improved quickly after receiving a single dose of intravenous diuretics and increased dose of metoprolol.  She followed up in the A. fib clinic on August 2 and was doing well at the time.  She has been able to receive her medications including the anticoagulant without interruptions.  She now has Medicaid making her medications affordable.  She has not had any neurological events since her admission for stroke in May 2019 when her Xarelto was interrupted.  She was evaluated in the emergency room in April after an accidental head injury, no problems identified on head CT.  She is only occasionally aware of palpitations if she lies in a certain position.  The patient specifically denies any chest pain  at rest exertion, dyspnea at rest or with exertion, orthopnea, paroxysmal nocturnal dyspnea, syncope, rapid palpitations, focal neurological deficits, intermittent claudication, lower extremity edema, unexplained weight gain, cough, hemoptysis or wheezing.  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  She currently has 2 clients.  She lives with her daughter and granddaughter (51-year-old who will be homeschooled this year).  Past Medical History:  Diagnosis Date   Clotting disorder ([place])    on xarelto   Essential hypertension    Heart murmur    a. 10/2015 Echo: EF 60-65%, no rwma, mild AI/MR, sev dil LA/RA, PASP 60mmHg.   Noncompliance    NSVT (nonsustained ventricular tachycardia) ([place])    a. 10/2015 during admission for CVA/AF.   Paroxysmal A-fib ([place])    a. CHA2DS2VASC = 4-->Xarelto;  b. 02/2016 Successful DCCV.   Pneumonia 2012 x 2; 2015   Stroke [facility])    a. 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   Transient ischemic attack (TIA) 01/2013    Past Surgical History:  Procedure Laterality Date   CARDIOVERSION N/A 02/18/2013   Procedure: CARDIOVERSION;  Surgeon: Elizabeth Klein, MD;  Location: [place];  Service: Cardiovascular;  Laterality: N/A;   CARDIOVERSION N/A 02/23/2013   Procedure: CARDIOVERSION;  Surgeon: Elizabeth Man, MD;  Location: [place];  Service: Cardiovascular;  Laterality: N/A;  [place] CV   CARDIOVERSION N/A 03/12/2016   Procedure: CARDIOVERSION;  Surgeon: Elizabeth Dresser, MD;  Location: [facility]  ENDOSCOPY;  Service: Cardiovascular;  Laterality: N/A;   TEE WITHOUT CARDIOVERSION N/A 02/18/2013   Procedure: TRANSESOPHAGEAL ECHOCARDIOGRAM (TEE);  Surgeon: Elizabeth Klein, MD;  Location: [facility] ENDOSCOPY;  Service: Cardiovascular;  Laterality: N/A;   TUBAL LIGATION  1992    Current Medications: Current Meds  Medication Sig   Accu-Chek FastClix Lancets  MISC USE AS INSTRUCTED TO CHECK BLOOD SUGAR ONCE DAILY.   ACCU-CHEK GUIDE test strip USE AS INSTRUCTED TO CHECK BLOOD SUGAR ONCE DAILY.   acetaminophen (TYLENOL) 500 MG tablet Take 1,000 mg by mouth every 6 (six) hours as needed for headache.    albuterol (PROVENTIL HFA;VENTOLIN HFA) 108 (90 Base) MCG/ACT inhaler Inhale 2 puffs into the lungs every 6 (six) hours as needed for wheezing or shortness of breath.   atorvastatin (LIPITOR) 80 MG tablet Take 1 tablet (80 mg total) by mouth daily at 6 PM.   Blood Glucose Monitoring Suppl (ACCU-CHEK GUIDE ME) w/Device KIT 1 kit by Does not apply route daily.   butalbital-acetaminophen-caffeine (FIORICET) 50-325-40 MG tablet TAKE ONE TABLET EVERY 12 HOURS AS NEEDED   cetirizine (ZYRTEC) 10 MG tablet Take 1 tablet (10 mg total) by mouth daily.   diclofenac sodium (VOLTAREN) 1 % GEL Apply 4 g topically 4 (four) times daily.   diltiazem (CARDIZEM CD) 300 MG 24 hr capsule Take 1 capsule (300 mg total) by mouth daily.   fluticasone (FLONASE) 50 MCG/ACT nasal spray Place 2 sprays into both nostrils daily.   furosemide (LASIX) 20 MG tablet Take 1 tablet (20 mg total) by mouth as needed.   gabapentin (NEURONTIN) 300 MG capsule TAKE 1 CAPSULE BY MOUTH 2 TIMES DAILY. (Patient taking differently: Take 300 mg by mouth 2 (two) times daily. )   hydrochlorothiazide (MICROZIDE) 12.5 MG capsule TAKE 1 CAPSULE (12.5 MG TOTAL) BY MOUTH DAILY.   metFORMIN (GLUCOPHAGE) 500 MG tablet Take 1 tablet (500 mg total) by mouth 2 (two) times daily with a meal.   methocarbamol (ROBAXIN) 500 MG tablet Take 1 tablet (500 mg total) by mouth every 8 (eight) hours as needed for muscle spasms.   Misc. Devices MISC Blood pressure monitor.  Diagnosis Hypertension   rivaroxaban (XARELTO) 20 MG TABS tablet Take 1 tablet (20 mg total) by mouth daily with supper.   Current Facility-Administered Medications for the 06/15/20 encounter (Office Visit) with Elizabeth Trovato, MD    Medication   0.9 %  sodium chloride infusion     Allergies:   Penicillins   Social History   Socioeconomic History   Marital status: Single    Spouse name: Not on file   Number of children: Not on file   Years of education: Not on file   Highest education level: Not on file  Occupational History   Occupation: Training and development officer    Employer: [employer]   Occupation: in home health aide  Tobacco Use   Smoking status: Former Smoker    Years: 3.00    Types: Cigarettes    Quit date: 03/15/2013    Years since quitting: 7.2   Smokeless tobacco: Never Used  Vaping Use   Vaping Use: Never used  Substance and Sexual Activity   Alcohol use: Yes    Comment: drinks on the weekends,   2-3 beers   Drug use: No   Sexual activity: Not Currently    Birth control/protection: None  Other Topics Concern   Not on file  Social History Narrative   Not on file   Social Determinants of Health  Financial Resource Strain:    Difficulty of Paying Living Expenses: Not on file  Food Insecurity:    Worried About Charity fundraiser in the Last Year: Not on file   [employer] of Food in the Last Year: Not on file  Transportation Needs:    Lack of Transportation (Medical): Not on file   Lack of Transportation (Non-Medical): Not on file  Physical Activity:    Days of Exercise per Week: Not on file   Minutes of Exercise per Session: Not on file  Stress:    Feeling of Stress : Not on file  Social Connections:    Frequency of Communication with Friends and Family: Not on file   Frequency of Social Gatherings with Friends and Family: Not on file   Attends Religious Services: Not on file   Active Member of Clubs or Organizations: Not on file   Attends Archivist Meetings: Not on file   Marital Status: Not on file     Family History: The patient's family history includes Atrial fibrillation in her father and mother; Breast cancer in her mother; Cancer in her  mother; Diabetes in an other family member; Hypertension in her father and another family member; Peripheral vascular disease in her father. There is no history of Heart attack, Stroke, Rectal cancer, Colon cancer, Esophageal cancer, or Stomach cancer.  ROS:   Please see the history of present illness.     All other systems reviewed and are negative.  EKGs/Labs/Other Studies Reviewed:    The following studies were reviewed today:  Echo 05/09/2019:   1. The left ventricle has normal systolic function with an ejection  fraction of 60-65%. The cavity size was normal. There is mildly increased  left ventricular wall thickness. not assessed due to atrial flutter. No  evidence of left ventricular regional  wall motion abnormalities.  2. Left atrial size was moderately dilated.  3. The mitral valve is abnormal. Mild thickening of the mitral valve  leaflet.  4. The tricuspid valve is grossly normal.  5. The aortic valve is tricuspid. Mild sclerosis of the aortic valve.  Aortic valve regurgitation is mild by color flow Doppler. No stenosis of  the aortic valve.  6. The aorta is normal unless otherwise noted.  7. The inferior vena cava was dilated in size with >50% respiratory  variability.  8. When compared to the prior study: 02/09/2018: LVEF 60-65%, mild AI,  possible LA thrombus.   SUMMARY    LVEF 60-65%, mild LVH, normal wall motion, moderate LAE, aortic  valve sclerosis with mild AI, dilated IVC that collapses   EKG:  EKG is  ordered today.  The ekg ordered today demonstrates atrial fibrillation controlled ventricular response, left axis deviation.  Inferolateral T wave inversion, borderline QTC 466 ms.  Varying degrees of T wave inversion have been seen on previous ECGs, interestingly they are always more obvious when her heart rate is slower.  Recent Labs: 10/21/2019: ALT 12 04/10/2020: B Natriuretic Peptide 339.9; BUN 13; Creatinine, Ser 0.99; Hemoglobin 12.7; Platelets  206; Potassium 3.5; Sodium 138  Recent Lipid Panel    Component Value Date/Time   CHOL 101 10/21/2019 1458   TRIG 79 10/21/2019 1458   HDL 36 (L) 10/21/2019 1458   CHOLHDL 2.8 10/21/2019 1458   CHOLHDL 3.1 02/09/2018 0531   VLDL 17 02/09/2018 0531   LDLCALC 49 10/21/2019 1458    Physical Exam:    VS:  Ht $R'5\' 4"'jR$  (1.626 m)  Wt 217 lb 6.4 oz (98.6 kg)    BMI 37.32 kg/m     Wt Readings from Last 3 Encounters:  06/15/20 217 lb 6.4 oz (98.6 kg)  04/16/20 (!) 218 lb 12.8 oz (99.2 kg)  04/10/20 (!) 220 lb (99.8 kg)      General: Alert, oriented x3, no distress, obese Head: no evidence of trauma, PERRL, EOMI, no exophtalmos or lid lag, no myxedema, no xanthelasma; normal ears, nose and oropharynx Neck: normal jugular venous pulsations and no hepatojugular reflux; brisk carotid pulses without delay and no carotid bruits Chest: clear to auscultation, no signs of consolidation by percussion or palpation, normal fremitus, symmetrical and full respiratory excursions Cardiovascular: normal position and quality of the apical impulse, irregular rhythm, normal first and second heart sounds, no murmurs, rubs or gallops Abdomen: no tenderness or distention, no masses by palpation, no abnormal pulsatility or arterial bruits, normal bowel sounds, no hepatosplenomegaly Extremities: no clubbing, cyanosis or edema; 2+ radial, ulnar and brachial pulses bilaterally; 2+ right femoral, posterior tibial and dorsalis pedis pulses; 2+ left femoral, posterior tibial and dorsalis pedis pulses; no subclavian or femoral bruits Neurological: grossly nonfocal Psych: Normal mood and affect   ASSESSMENT:    1. Longstanding persistent atrial fibrillation ([place])   2. Chronic diastolic CHF (congestive heart failure) ([place])   3. Long term current use of anticoagulant   4. Severe obesity (BMI 35.0-39.9) with comorbidity ([place])   5. Dyslipidemia (high LDL; low HDL)   6. Prediabetes    PLAN:    In order of problems  listed above:  1. AFib: Well rate controlled.  Appropriately anticoagulated.  Minimally symptomatic.  No plan for antiarrhythmic therapy, suspect she will have permanent atrial fibrillation. CHADSVasc 4 (gender, hypertension, CVA 2). 2. CHF: Decompensation in July was possibly related to sodium intake access and skipping diuretics, also had rapid ventricular rates, but RVR was likely a consequence of heart failure rather than vice versa 3. Anticoagulation: Well-tolerated, no serious bleeding problems.  Would avoid any interruption in anticoagulation without good reason since she has a history of embolic stroke. 4. Obesity: Denies daytime hypersomnolence and snoring.  Would benefit from additional weight loss. 5. HTN: Well-controlled. 6. HLP: All lipid parameters are excellent on the current dose of atorvastatin, with the exception of the low HDL which will only improve with weight loss and increase physical activity..  7. PreDM: Most recent hemoglobin A1c showed improvement and was down to 5.9% in May.   Medication Adjustments/Labs and Tests Ordered: Current medicines are reviewed at length with the patient today.  Concerns regarding medicines are outlined above.  No orders of the defined types were placed in this encounter.  No orders of the defined types were placed in this encounter.   There are no Patient Instructions on file for this visit.   Signed, Elizabeth Klein, MD  06/15/2020 8:32 AM    [facility]

## 2020-06-15 NOTE — Patient Instructions (Signed)

## 2020-06-19 DIAGNOSIS — G4733 Obstructive sleep apnea (adult) (pediatric): Secondary | ICD-10-CM | POA: Diagnosis not present

## 2020-06-20 ENCOUNTER — Ambulatory Visit: Payer: Medicaid Other | Attending: Family Medicine | Admitting: Family Medicine

## 2020-06-20 ENCOUNTER — Other Ambulatory Visit: Payer: Self-pay

## 2020-06-20 ENCOUNTER — Other Ambulatory Visit (HOSPITAL_COMMUNITY)
Admission: RE | Admit: 2020-06-20 | Discharge: 2020-06-20 | Disposition: A | Discharge status: Home / Self Care | Payer: Medicaid Other | Source: Ambulatory Visit | Attending: Family Medicine | Admitting: Family Medicine

## 2020-06-20 ENCOUNTER — Encounter: Payer: Self-pay | Admitting: Family Medicine

## 2020-06-20 VITALS — BP 130/86 | HR 103 | Ht 64.0 in | Wt 215.0 lb

## 2020-06-20 DIAGNOSIS — Z113 Encounter for screening for infections with a predominantly sexual mode of transmission: Secondary | ICD-10-CM | POA: Diagnosis present

## 2020-06-20 DIAGNOSIS — Z124 Encounter for screening for malignant neoplasm of cervix: Secondary | ICD-10-CM

## 2020-06-20 DIAGNOSIS — Z23 Encounter for immunization: Secondary | ICD-10-CM

## 2020-06-20 DIAGNOSIS — Z0000 Encounter for general adult medical examination without abnormal findings: Secondary | ICD-10-CM

## 2020-06-20 DIAGNOSIS — Z1231 Encounter for screening mammogram for malignant neoplasm of breast: Secondary | ICD-10-CM

## 2020-06-20 NOTE — Patient Instructions (Signed)
Health Maintenance, Female Adopting a healthy lifestyle and getting preventive care are important in promoting health and wellness. Ask your health care provider about:  The right schedule for you to have regular tests and exams.  Things you can do on your own to prevent diseases and keep yourself healthy. What should I know about diet, weight, and exercise? Eat a healthy diet   Eat a diet that includes plenty of vegetables, fruits, low-fat dairy products, and lean protein.  Do not eat a lot of foods that are high in solid fats, added sugars, or sodium. Maintain a healthy weight Body mass index (BMI) is used to identify weight problems. It estimates body fat based on height and weight. Your health care provider can help determine your BMI and help you achieve or maintain a healthy weight. Get regular exercise Get regular exercise. This is one of the most important things you can do for your health. Most adults should:  Exercise for at least 150 minutes each week. The exercise should increase your heart rate and make you sweat (moderate-intensity exercise).  Do strengthening exercises at least twice a week. This is in addition to the moderate-intensity exercise.  Spend less time sitting. Even light physical activity can be beneficial. Watch cholesterol and blood lipids Have your blood tested for lipids and cholesterol at 56 years of age, then have this test every 5 years. Have your cholesterol levels checked more often if:  Your lipid or cholesterol levels are high.  You are older than 56 years of age.  You are at high risk for heart disease. What should I know about cancer screening? Depending on your health history and family history, you may need to have cancer screening at various ages. This may include screening for:  Breast cancer.  Cervical cancer.  Colorectal cancer.  Skin cancer.  Lung cancer. What should I know about heart disease, diabetes, and high blood  pressure? Blood pressure and heart disease  High blood pressure causes heart disease and increases the risk of stroke. This is more likely to develop in people who have high blood pressure readings, are of African descent, or are overweight.  Have your blood pressure checked: ? Every 3-5 years if you are 18-39 years of age. ? Every year if you are 40 years old or older. Diabetes Have regular diabetes screenings. This checks your fasting blood sugar level. Have the screening done:  Once every three years after age 40 if you are at a normal weight and have a low risk for diabetes.  More often and at a younger age if you are overweight or have a high risk for diabetes. What should I know about preventing infection? Hepatitis B If you have a higher risk for hepatitis B, you should be screened for this virus. Talk with your health care provider to find out if you are at risk for hepatitis B infection. Hepatitis C Testing is recommended for:  Everyone born from 1945 through 1965.  Anyone with known risk factors for hepatitis C. Sexually transmitted infections (STIs)  Get screened for STIs, including gonorrhea and chlamydia, if: ? You are sexually active and are younger than 56 years of age. ? You are older than 56 years of age and your health care provider tells you that you are at risk for this type of infection. ? Your sexual activity has changed since you were last screened, and you are at increased risk for chlamydia or gonorrhea. Ask your health care provider if   you are at risk.  Ask your health care provider about whether you are at high risk for HIV. Your health care provider may recommend a prescription medicine to help prevent HIV infection. If you choose to take medicine to prevent HIV, you should first get tested for HIV. You should then be tested every 3 months for as long as you are taking the medicine. Pregnancy  If you are about to stop having your period (premenopausal) and  you may become pregnant, seek counseling before you get pregnant.  Take 400 to 800 micrograms (mcg) of folic acid every day if you become pregnant.  Ask for birth control (contraception) if you want to prevent pregnancy. Osteoporosis and menopause Osteoporosis is a disease in which the bones lose minerals and strength with aging. This can result in bone fractures. If you are 65 years old or older, or if you are at risk for osteoporosis and fractures, ask your health care provider if you should:  Be screened for bone loss.  Take a calcium or vitamin D supplement to lower your risk of fractures.  Be given hormone replacement therapy (HRT) to treat symptoms of menopause. Follow these instructions at home: Lifestyle  Do not use any products that contain nicotine or tobacco, such as cigarettes, e-cigarettes, and chewing tobacco. If you need help quitting, ask your health care provider.  Do not use street drugs.  Do not share needles.  Ask your health care provider for help if you need support or information about quitting drugs. Alcohol use  Do not drink alcohol if: ? Your health care provider tells you not to drink. ? You are pregnant, may be pregnant, or are planning to become pregnant.  If you drink alcohol: ? Limit how much you use to 0-1 drink a day. ? Limit intake if you are breastfeeding.  Be aware of how much alcohol is in your drink. In the [place] one drink equals one 12 oz bottle of beer (355 mL), one 5 oz glass of wine (148 mL), or one 1 oz glass of hard liquor (44 mL). General instructions  Schedule regular health, dental, and eye exams.  Stay current with your vaccines.  Tell your health care provider if: ? You often feel depressed. ? You have ever been abused or do not feel safe at home. Summary  Adopting a healthy lifestyle and getting preventive care are important in promoting health and wellness.  Follow your health care provider's instructions about healthy  diet, exercising, and getting tested or screened for diseases.  Follow your health care provider's instructions on monitoring your cholesterol and blood pressure. This information is not intended to replace advice given to you by your health care provider. Make sure you discuss any questions you have with your health care provider. Document Revised: 08/25/2018 Document Reviewed: 08/25/2018 Elsevier Patient Education  2020 [employer]  

## 2020-06-20 NOTE — Progress Notes (Signed)
 Subjective:  Patient ID: [name], female    DOB: 06/03/1966  Age: 54 y.o. MRN: [id number]  CC: Gynecologic Exam and Annual Exam   HPI [name] is a 53-year-old female with a history of atrial fibrillation/atrial flutter (status post cardioversion in 02/2016 currently on rate control with metoprolol and Cardizem and anticoagulation Xarelto s/p DCCV in 2017 ), cerebral infarction due to embolism of right middle cerebral artery status post IV TPA (in 10/2015), right MCA stroke in 01/2018 (after running out of Xarelto), Type 2 DM (A1c 6.5)here for a complete physical exam. She is due for Pap smear and mammogram. Last colonoscopy was in 2019 and she is not due until 2024. She would like STD testing as well even though she is currently not sexually active.  Past Medical History:  Diagnosis Date  . Clotting disorder (HCC)    on xarelto  . Essential hypertension   . Heart murmur    a. 10/2015 Echo: EF 60-65%, no rwma, mild AI/MR, sev dil LA/RA, PASP 35mmHg.  . Noncompliance   . NSVT (nonsustained ventricular tachycardia) (HCC)    a. 10/2015 during admission for CVA/AF.  . Paroxysmal A-fib (HCC)    a. CHA2DS2VASC = 4-->Xarelto;  b. 02/2016 Successful DCCV.  . Pneumonia 2012 x 2; 2015  . Stroke (HCC)    a. 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  . Transient ischemic attack (TIA) 01/2013    Past Surgical History:  Procedure Laterality Date  . CARDIOVERSION N/A 02/18/2013   Procedure: CARDIOVERSION;  Surgeon: Mihai Croitoru, MD;  Location: [facility];  Service: Cardiovascular;  Laterality: N/A;  . CARDIOVERSION N/A 02/23/2013   Procedure: CARDIOVERSION;  Surgeon: David W Harding, MD;  Location: [facility] OR;  Service: Cardiovascular;  Laterality: N/A;  BESIDE CV  . CARDIOVERSION N/A 03/12/2016   Procedure: CARDIOVERSION;  Surgeon: Dalton S McLean, MD;  Location: [facility];  Service: Cardiovascular;   Laterality: N/A;  . TEE WITHOUT CARDIOVERSION N/A 02/18/2013   Procedure: TRANSESOPHAGEAL ECHOCARDIOGRAM (TEE);  Surgeon: Mihai Croitoru, MD;  Location: [facility];  Service: Cardiovascular;  Laterality: N/A;  . TUBAL LIGATION  1992    Family History  Problem Relation Age of Onset  . Cancer Mother   . Atrial fibrillation Mother   . Breast cancer Mother   . Hypertension Father   . Atrial fibrillation Father   . Peripheral vascular disease Father   . Hypertension Other   . Diabetes Other   . Heart attack Neg Hx   . Stroke Neg Hx   . Rectal cancer Neg Hx   . Colon cancer Neg Hx   . Esophageal cancer Neg Hx   . Stomach cancer Neg Hx     Allergies  Allergen Reactions  . Penicillins Hives and Other (See Comments)    Unknown  Has patient had a PCN reaction causing immediate rash, facial/tongue/throat swelling, SOB or lightheadedness with hypotension: No Has patient had a PCN reaction causing severe rash involving mucus membranes or skin necrosis: No Has patient had a PCN reaction that required hospitalization No Has patient had a PCN reaction occurring within the last 10 years: No If all of the above answers are "NO", then may proceed with Cephalosporin use.    Outpatient Medications Prior to Visit  Medication Sig Dispense Refill  . Accu-Chek FastClix Lancets MISC USE AS INSTRUCTED TO CHECK BLOOD SUGAR ONCE DAILY. 102 each 2  . ACCU-CHEK GUIDE test   strip USE AS INSTRUCTED TO CHECK BLOOD SUGAR ONCE DAILY. 100 strip 2  . acetaminophen (TYLENOL) 500 MG tablet Take 1,000 mg by mouth every 6 (six) hours as needed for headache.     . albuterol (PROVENTIL HFA;VENTOLIN HFA) 108 (90 Base) MCG/ACT inhaler Inhale 2 puffs into the lungs every 6 (six) hours as needed for wheezing or shortness of breath. 1 Inhaler 2  . atorvastatin (LIPITOR) 80 MG tablet Take 1 tablet (80 mg total) by mouth daily at 6 PM. 30 tablet 6  . Blood Glucose Monitoring Suppl (ACCU-CHEK GUIDE ME) w/Device KIT 1 kit by  Does not apply route daily. 1 kit 0  . butalbital-acetaminophen-caffeine (FIORICET) 50-325-40 MG tablet TAKE ONE TABLET EVERY 12 HOURS AS NEEDED 40 tablet 1  . cetirizine (ZYRTEC) 10 MG tablet Take 1 tablet (10 mg total) by mouth daily. 30 tablet 6  . diclofenac sodium (VOLTAREN) 1 % GEL Apply 4 g topically 4 (four) times daily. 100 g 3  . fluticasone (FLONASE) 50 MCG/ACT nasal spray Place 2 sprays into both nostrils daily. 16 g 6  . furosemide (LASIX) 20 MG tablet Take 1 tablet (20 mg total) by mouth as needed. 30 tablet 2  . gabapentin (NEURONTIN) 300 MG capsule TAKE 1 CAPSULE BY MOUTH 2 TIMES DAILY. (Patient taking differently: Take 300 mg by mouth 2 (two) times daily. ) 60 capsule 3  . metFORMIN (GLUCOPHAGE) 500 MG tablet Take 1 tablet (500 mg total) by mouth 2 (two) times daily with a meal. 60 tablet 6  . methocarbamol (ROBAXIN) 500 MG tablet Take 1 tablet (500 mg total) by mouth every 8 (eight) hours as needed for muscle spasms. 90 tablet 1  . Misc. Devices MISC Blood pressure monitor.  Diagnosis Hypertension 1 each 0  . rivaroxaban (XARELTO) 20 MG TABS tablet Take 1 tablet (20 mg total) by mouth daily with supper. 30 tablet 6  . diltiazem (CARDIZEM CD) 300 MG 24 hr capsule Take 1 capsule (300 mg total) by mouth daily. 30 capsule 6  . hydrochlorothiazide (MICROZIDE) 12.5 MG capsule TAKE 1 CAPSULE (12.5 MG TOTAL) BY MOUTH DAILY. 90 capsule 3  . metoprolol tartrate (LOPRESSOR) 25 MG tablet Take 1 tablet (25 mg total) by mouth 2 (two) times daily. 60 tablet 6   Facility-Administered Medications Prior to Visit  Medication Dose Route Frequency Provider Last Rate Last Admin  . 0.9 %  sodium chloride infusion  500 mL Intravenous Once Armbruster, Carlota Raspberry, MD         ROS Review of Systems  Constitutional: Negative for activity change, appetite change and fatigue.  HENT: Negative for congestion, sinus pressure and sore throat.   Eyes: Negative for visual disturbance.  Respiratory: Negative for  cough, chest tightness, shortness of breath and wheezing.   Cardiovascular: Negative for chest pain and palpitations.  Gastrointestinal: Negative for abdominal distention, abdominal pain and constipation.  Endocrine: Negative for polydipsia.  Genitourinary: Negative for dysuria and frequency.  Musculoskeletal: Negative for arthralgias and back pain.  Skin: Negative for rash.  Neurological: Negative for tremors, light-headedness and numbness.  Hematological: Does not bruise/bleed easily.  Psychiatric/Behavioral: Negative for agitation and behavioral problems.    Objective:  BP 130/86   Pulse (!) 103   Ht 5' 4" (1.626 m)   Wt 215 lb (97.5 kg)   SpO2 97%   BMI 36.90 kg/m   BP/Weight 06/20/2020 25/12/2704 10/18/7626  Systolic BP 315 176 160  Diastolic BP 86 82 76  Wt. (Lbs) 215 217.4  218.8  BMI 36.9 37.32 37.56      Physical Exam Constitutional:      General: She is not in acute distress.    Appearance: She is well-developed. She is not diaphoretic.  HENT:     Head: Normocephalic.     Right Ear: External ear normal.     Left Ear: External ear normal.     Nose: Nose normal.  Eyes:     Conjunctiva/sclera: Conjunctivae normal.     Pupils: Pupils are equal, round, and reactive to light.  Neck:     Vascular: No JVD.  Cardiovascular:     Rate and Rhythm: Normal rate. Rhythm irregular.     Heart sounds: Normal heart sounds. No murmur heard.  No gallop.   Pulmonary:     Effort: Pulmonary effort is normal. No respiratory distress.     Breath sounds: Normal breath sounds. No wheezing or rales.  Chest:     Chest wall: No tenderness.     Breasts:        Right: No mass or tenderness.        Left: No mass or tenderness.  Abdominal:     General: Bowel sounds are normal. There is no distension.     Palpations: Abdomen is soft. There is no mass.     Tenderness: There is no abdominal tenderness.  Genitourinary:    Comments: External genitalia, vagina,  cervix-normal Musculoskeletal:        General: No tenderness. Normal range of motion.     Cervical back: Normal range of motion.  Skin:    General: Skin is warm and dry.  Neurological:     Mental Status: She is alert and oriented to person, place, and time.     Deep Tendon Reflexes: Reflexes are normal and symmetric.     CMP Latest Ref Rng & Units 04/10/2020 10/21/2019 04/21/2019  Glucose 70 - 99 mg/dL 143(H) 87 98  BUN 6 - 20 mg/dL 13 11 10  Creatinine 0.44 - 1.00 mg/dL 0.99 0.95 0.87  Sodium 135 - 145 mmol/L 138 141 138  Potassium 3.5 - 5.1 mmol/L 3.5 4.4 4.1  Chloride 98 - 111 mmol/L 108 103 106  CO2 22 - 32 mmol/L 22 22 21(L)  Calcium 8.9 - 10.3 mg/dL 9.0 9.4 9.3  Total Protein 6.0 - 8.5 g/dL - 7.7 7.5  Total Bilirubin 0.0 - 1.2 mg/dL - 0.5 0.5  Alkaline Phos 39 - 117 IU/L - 101 79  AST 0 - 40 IU/L - 17 24  ALT 0 - 32 IU/L - 12 20    Lipid Panel     Component Value Date/Time   CHOL 101 10/21/2019 1458   TRIG 79 10/21/2019 1458   HDL 36 (L) 10/21/2019 1458   CHOLHDL 2.8 10/21/2019 1458   CHOLHDL 3.1 02/09/2018 0531   VLDL 17 02/09/2018 0531   LDLCALC 49 10/21/2019 1458    CBC    Component Value Date/Time   WBC 10.8 (H) 04/10/2020 0924   RBC 4.83 04/10/2020 0924   HGB 12.7 04/10/2020 0924   HCT 40.5 04/10/2020 0924   PLT 206 04/10/2020 0924   MCV 83.9 04/10/2020 0924   MCH 26.3 04/10/2020 0924   MCHC 31.4 04/10/2020 0924   RDW 15.0 04/10/2020 0924   LYMPHSABS 2.3 04/21/2019 1104   MONOABS 0.8 04/21/2019 1104   EOSABS 0.2 04/21/2019 1104   BASOSABS 0.1 04/21/2019 1104    Lab Results  Component Value Date   HGBA1C 5.9   01/18/2020    Assessment & Plan:  1. Annual physical exam Counseled on 150 minutes of exercise per week, healthy eating (including decreased daily intake of saturated fats, cholesterol, added sugars, sodium), routine healthcare maintenance.   2. Encounter for screening mammogram for malignant neoplasm of breast - MM 3D SCREEN BREAST  BILATERAL; Future  3. Screening for cervical cancer - Cytology - PAP([place])  4. Screening for STD (sexually transmitted disease) - Cervicovaginal ancillary only    No orders of the defined types were placed in this encounter.   Follow-up: Return in about 6 months (around 12/19/2020) for Chronic disease management.       [name], MD, FAAFP. [facility] [place], [place]   06/20/2020, 11:59 AM

## 2020-06-21 LAB — CERVICOVAGINAL ANCILLARY ONLY
Bacterial Vaginitis (gardnerella): POSITIVE — AB
Candida Glabrata: NEGATIVE
Candida Vaginitis: NEGATIVE
Chlamydia: NEGATIVE
Comment: NEGATIVE
Comment: NEGATIVE
Comment: NEGATIVE
Comment: NEGATIVE
Comment: NEGATIVE
Comment: NORMAL
Neisseria Gonorrhea: NEGATIVE
Trichomonas: NEGATIVE

## 2020-06-22 LAB — CYTOLOGY - PAP
Comment: NEGATIVE
Diagnosis: NEGATIVE
High risk HPV: NEGATIVE

## 2020-06-25 ENCOUNTER — Telehealth: Payer: Self-pay

## 2020-06-25 ENCOUNTER — Other Ambulatory Visit: Payer: Self-pay | Admitting: Family Medicine

## 2020-06-25 DIAGNOSIS — E119 Type 2 diabetes mellitus without complications: Secondary | ICD-10-CM

## 2020-06-25 MED ORDER — METRONIDAZOLE 0.75 % VA GEL: 1.0000 | Freq: Every day | VAGINAL | 0 refills | Status: DC

## 2020-06-25 MED FILL — METRONIDAZOLE 0.75 % GEL: 0.75 | 30 days supply | Qty: 70 | Fill #0

## 2020-06-25 NOTE — Telephone Encounter (Signed)
-----   Message from [name], MD sent at 06/25/2020  2:53 PM EDT ----- Pap smear is normal.  Vaginal swab is negative for STDs but she does have bacterial vaginosis and I have sent a prescription for MetroGel to her pharmacy.

## 2020-06-25 NOTE — Telephone Encounter (Signed)
Patient has viewed results via mychart  

## 2020-07-11 ENCOUNTER — Ambulatory Visit: Payer: Medicaid Other

## 2020-07-23 ENCOUNTER — Ambulatory Visit: Payer: Medicaid Other | Admitting: Family Medicine

## 2020-08-16 ENCOUNTER — Other Ambulatory Visit: Payer: Self-pay | Admitting: *Deleted

## 2020-08-16 ENCOUNTER — Other Ambulatory Visit: Payer: Self-pay

## 2020-08-16 ENCOUNTER — Telehealth: Payer: Self-pay

## 2020-08-16 NOTE — Telephone Encounter (Signed)
Patient scheduled with nurse on 08/20/2020 at [facility] for TB

## 2020-08-16 NOTE — Telephone Encounter (Signed)
Copied from [place] [id number]. Topic: Quick Communication - See Telephone Encounter >> Aug 15, 2020  5:17 PM Loma Boston wrote: CRM for notification. See Telephone encounter for: 08/15/20.pt wants to see if TB skin test would be available for tomorrow. Wants a cb she says ASAP. Office closed   Patient will need to schedule a nurse visit on a Monday or Tuesday. Tired to call patient to schedule but was unsuccessful. I left a voicemail asking patient to call back if appt was still needed.

## 2020-08-16 NOTE — Patient Instructions (Signed)
Visit Information  Ms. Gladhill was given information about Medicaid Managed Care team care coordination services as a part of their [place] [employer] benefit. OLIVIAROSE PUNCH verbally consented to engagement with the [facility] Managed Care team.   For questions related to your [facility], please call: [phone] or visit the homepage here: [email or web address]  If you would like to schedule transportation through your [facility], please call the following number at least 2 days in advance of your appointment: [phone]  Goals Addressed   None    Patient Care Plan: Managing Health Conditions    Problem Identified: Health Promotion or Disease Self-Management (General Plan of Care)     Long-Range Goal: Self-Management of Current Health Conditions   Start Date: 08/16/2020  Expected End Date: 10/17/2020  This Visit's Progress: On track  Priority: High  Note:   Current Barriers:  . Chronic Disease Management support and education needs related to Diabetes, A-fib and Stroke history.  Nurse Case Manager Clinical Goal(s):  [name] Over the next 60 days, patient will attend all scheduled medical appointments: PCP 09/11/21 . Over the next 60 days, patient will demonstrate improved health management independence as evidenced by improving lab results, decreased Emergency room visits . Over the next 30 days, patient will work with CM team pharmacist to taking elderberry for boosting her immune system and any possible interactions with her current medications . Over the next 60 days, the patient will demonstrate ongoing self health care management ability as evidenced by taking medications as prescribed, attending scheduled appointments, reaching out to her providers with any concerns or questions  Interventions:  . Inter-disciplinary care team collaboration (see longitudinal plan of  care) . Evaluation of current treatment plan related to diabetes and history of CVA and patient's adherence to plan as established by provider. . Advised patient to notify provider with any concerns, questions or changes in symptoms . Reviewed medications with patient and discussed her interest in elderberry and that she should discuss taking any supplements with her provider first . Discussed plans with patient for ongoing care management follow up and provided patient with direct contact information for care management team . Advised patient, providing education and rationale, to monitor blood pressure daily and record, calling provider for findings outside established parameters.  . Advised patient, providing education and rationale, to check cbg daily and record, calling provider for findings outside established parameters.   . Pharmacy referral for medication review and education  Patient Goals/Self-Care Activities Over the next 60 days, patient will:  -Self administers medications as prescribed Attends all scheduled provider appointments Calls pharmacy for medication refills Performs ADL's independently Performs IADL's independently Calls provider office for new concerns or questions  Follow Up Plan: Telephone follow up appointment with Managed Medicaid care management team member scheduled for:10/17/20 @ 3:30pm       Please see education materials related to diet provided by MyChart link.    Diabetes Mellitus and Nutrition, Adult When you have diabetes (diabetes mellitus), it is very important to have healthy eating habits because your blood sugar (glucose) levels are greatly affected by what you eat and drink. Eating healthy foods in the appropriate amounts, at about the same times every day, can help you:  Control your blood glucose.  Lower your risk of heart disease.  Improve your blood pressure.  Reach or maintain a healthy weight. Every person with diabetes is  different, and each person has different needs for  a meal plan. Your health care provider may recommend that you work with a diet and nutrition specialist (dietitian) to make a meal plan that is best for you. Your meal plan may vary depending on factors such as:  The calories you need.  The medicines you take.  Your weight.  Your blood glucose, blood pressure, and cholesterol levels.  Your activity level.  Other health conditions you have, such as heart or kidney disease. How do carbohydrates affect me? Carbohydrates, also called carbs, affect your blood glucose level more than any other type of food. Eating carbs naturally raises the amount of glucose in your blood. Carb counting is a method for keeping track of how many carbs you eat. Counting carbs is important to keep your blood glucose at a healthy level, especially if you use insulin or take certain oral diabetes medicines. It is important to know how many carbs you can safely have in each meal. This is different for every person. Your dietitian can help you calculate how many carbs you should have at each meal and for each snack. Foods that contain carbs include:  Bread, cereal, rice, pasta, and crackers.  Potatoes and corn.  Peas, beans, and lentils.  Milk and yogurt.  Fruit and juice.  Desserts, such as cakes, cookies, ice cream, and candy. How does alcohol affect me? Alcohol can cause a sudden decrease in blood glucose (hypoglycemia), especially if you use insulin or take certain oral diabetes medicines. Hypoglycemia can be a life-threatening condition. Symptoms of hypoglycemia (sleepiness, dizziness, and confusion) are similar to symptoms of having too much alcohol. If your health care provider says that alcohol is safe for you, follow these guidelines:  Limit alcohol intake to no more than 1 drink per day for nonpregnant women and 2 drinks per day for men. One drink equals 12 oz of beer, 5 oz of wine, or 1 oz of hard  liquor.  Do not drink on an empty stomach.  Keep yourself hydrated with water, diet soda, or unsweetened iced tea.  Keep in mind that regular soda, juice, and other mixers may contain a lot of sugar and must be counted as carbs. What are tips for following this plan?  Reading food labels  Start by checking the serving size on the "Nutrition Facts" label of packaged foods and drinks. The amount of calories, carbs, fats, and other nutrients listed on the label is based on one serving of the item. Many items contain more than one serving per package.  Check the total grams (g) of carbs in one serving. You can calculate the number of servings of carbs in one serving by dividing the total carbs by 15. For example, if a food has 30 g of total carbs, it would be equal to 2 servings of carbs.  Check the number of grams (g) of saturated and trans fats in one serving. Choose foods that have low or no amount of these fats.  Check the number of milligrams (mg) of salt (sodium) in one serving. Most people should limit total sodium intake to less than 2,300 mg per day.  Always check the nutrition information of foods labeled as "low-fat" or "nonfat". These foods may be higher in added sugar or refined carbs and should be avoided.  Talk to your dietitian to identify your daily goals for nutrients listed on the label. Shopping  Avoid buying canned, premade, or processed foods. These foods tend to be high in fat, sodium, and added sugar.  Shop  around the outside edge of the grocery store. This includes fresh fruits and vegetables, bulk grains, fresh meats, and fresh dairy. Cooking  Use low-heat cooking methods, such as baking, instead of high-heat cooking methods like deep frying.  Sanchez using healthy oils, such as olive, canola, or sunflower oil.  Avoid cooking with butter, cream, or high-fat meats. Meal planning  Eat meals and snacks regularly, preferably at the same times every day. Avoid going  long periods of time without eating.  Eat foods high in fiber, such as fresh fruits, vegetables, beans, and whole grains. Talk to your dietitian about how many servings of carbs you can eat at each meal.  Eat 4-6 ounces (oz) of lean protein each day, such as lean meat, chicken, fish, eggs, or tofu. One oz of lean protein is equal to: ? 1 oz of meat, chicken, or fish. ? 1 egg. ?  cup of tofu.  Eat some foods each day that contain healthy fats, such as avocado, nuts, seeds, and fish. Lifestyle  Check your blood glucose regularly.  Exercise regularly as told by your health care provider. This may include: ? 150 minutes of moderate-intensity or vigorous-intensity exercise each week. This could be brisk walking, biking, or water aerobics. ? Stretching and doing strength exercises, such as yoga or weightlifting, at least 2 times a week.  Take medicines as told by your health care provider.  Do not use any products that contain nicotine or tobacco, such as cigarettes and e-cigarettes. If you need help quitting, ask your health care provider.  Work with a Social worker or diabetes educator to identify strategies to manage stress and any emotional and social challenges. Questions to ask a health care provider  Do I need to meet with a diabetes educator?  Do I need to meet with a dietitian?  What number can I call if I have questions?  When are the best times to check my blood glucose? Where to find more information:  American Diabetes Association: diabetes.org  Academy of Nutrition and Dietetics: www.eatright.[employer] of Diabetes and Digestive and Kidney Diseases (NIH): [employer] Summary  A healthy meal plan will help you control your blood glucose and maintain a healthy lifestyle.  Working with a diet and nutrition specialist (dietitian) can help you make a meal plan that is best for you.  Keep in mind that carbohydrates (carbs) and alcohol have immediate  effects on your blood glucose levels. It is important to count carbs and to use alcohol carefully. This information is not intended to replace advice given to you by your health care provider. Make sure you discuss any questions you have with your health care provider. Document Revised: 08/14/2017 Document Reviewed: 10/06/2016 Elsevier Patient Education  2020 [employer].   Patient verbalizes understanding of instructions provided today.   Telephone follow up appointment with Managed Medicaid care management team member scheduled for:10/17/20 @ 3:30  Lurena Joiner RN, [place] RN Care Coordinator

## 2020-08-20 ENCOUNTER — Other Ambulatory Visit: Payer: Self-pay

## 2020-08-20 ENCOUNTER — Ambulatory Visit: Payer: Medicaid Other | Attending: Family Medicine

## 2020-08-20 DIAGNOSIS — Z111 Encounter for screening for respiratory tuberculosis: Secondary | ICD-10-CM | POA: Diagnosis not present

## 2020-08-20 NOTE — Progress Notes (Signed)
Tuberculin skin test applied to left ventral forearm.  Explained to patient to return within 48-72 hours for reading. Patient verbalized understanding.

## 2020-08-20 NOTE — Patient Instructions (Signed)
Visit Information  [name] was given information about Medicaid Managed Care team care coordination services as a part of their [place] Medicaid benefit. [name] verbally consented to engagement with the [facility] Managed Care team.   For questions related to your [facility], please call: [phone] or visit the homepage here: [email or web address]  If you would like to schedule transportation through your [facility], please call the following number at least 2 days in advance of your appointment: [phone]  Goals Addressed            This Visit's Progress   . Medication Management       Medicaid Managed Care CARE PLAN ENTRY (see longitudinal plan of care for additional care plan information)  Current Barriers:  . Social, financial, community barriers: Patient states medications are not expensive . Patient with complex health conditions multiple comorbidities including DM, Afib, Cholesterol . Self-manages medications, Does not use a pill box or other adherence strategies . [place], [place] [address] [place] [address] Phone: [phone] Fax: [phone]      Pharmacist Clinical Goal(s):  [name] Over the next 30 days, patient will work with PharmD and provider towards optimized medication management . Improve compliance on Metformin . After, will work on compliance of other medications  Interventions: . Comprehensive medication review performed; medication list updated in electronic medical record . Inter-disciplinary care team collaboration (see longitudinal plan of care) Afib BP Readings from Last 3 Encounters:  06/20/20 130/86  06/15/20 (!) 146/82  04/16/20 126/76    Rate Control Metoprolol Tartrate 25mg  BID (Non-compliant per EHR) Diltiazem 300mg  (Non-compliant  per EHR) Xarelto 20mg  (Non-compliant per EHR) HCTZ 12.5mg  (Non-compliant per EHR) Plan: Will follow up in 1 month, stressed importance of compliance today but patient stated she is compliant. 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 Will ask PCP to change to Metformin ER 500mg  QD to improve compliance  Cholesterol Atorvastatin 80mg  (Non-compliant per EHR) Plan: Will follow up in 1 month, stressed importance of compliance today but patient stated she is compliant. Will try to work on Metformin issues to build trust, then re-assess compliance at next visit  Pain Gabapentin Methocarbamol 500mg  TID PRN Plan: Patient stated pain is under control, patient stable/at goal   Patient Self Care Activities:  . Patient will take medications as prescribed . Patient will focus on improved adherence by january   Initial goal documentation  [place] Constable, Pharm.D., Managed Medicaid Pharmacist - [phone]        Patient Care Plan: Managing Health Conditions    Problem Identified: Health Promotion or Disease Self-Management (General Plan of Care)     Long-Range Goal: Self-Management of Current Health Conditions   Start Date: 08/16/2020  Expected End Date: 10/17/2020  This Visit's Progress: On track  Priority: High  Note:   Current Barriers:  . Chronic Disease Management support and education needs related to Diabetes, A-fib and Stroke history.  Nurse Case Manager Clinical Goal(s):  [name] Over the next 60 days, patient will attend all scheduled medical appointments: PCP 09/11/21 .  Over the next 60 days, patient will demonstrate improved health management independence as evidenced by  improving lab results, decreased Emergency room visits . Over the next 30 days, patient will work with CM team pharmacist to taking elderberry for boosting her immune system and any possible interactions with her current medications . Over the next 60 days, the patient will demonstrate ongoing self health care management ability as evidenced by taking medications as prescribed, attending scheduled appointments, reaching out to her providers with any concerns or questions  Interventions:  . Inter-disciplinary care team collaboration (see longitudinal plan of care) . Evaluation of current treatment plan related to diabetes and history of CVA and patient's adherence to plan as established by provider. . Advised patient to notify provider with any concerns, questions or changes in symptoms . Reviewed medications with patient and discussed her interest in elderberry and that she should discuss taking any supplements with her provider first . Discussed plans with patient for ongoing care management follow up and provided patient with direct contact information for care management team . Advised patient, providing education and rationale, to monitor blood pressure daily and record, calling provider for findings outside established parameters.  . Advised patient, providing education and rationale, to check cbg daily and record, calling provider for findings outside established parameters.   . Pharmacy referral for medication review and education  Patient Goals/Self-Care Activities Over the next 60 days, patient will:  -Self administers medications as prescribed Attends all scheduled provider appointments Calls pharmacy for medication refills Performs ADL's independently Performs IADL's independently Calls provider office for new concerns or questions  Follow Up Plan: Telephone follow up appointment with Managed Medicaid care management team member scheduled for:10/17/20 @ 3:30pm       Please see  education materials related to DM provided as print materials.   Patient verbalizes understanding of instructions provided today.   The Managed Medicaid care management team will reach out to the patient again over the next 35 days.   Lane Hacker, [facility]  Diabetes Basics  Diabetes (diabetes mellitus) is a long-term (chronic) disease. It occurs when the body does not properly use sugar (glucose) that is released from food after you eat. Diabetes may be caused by one or both of these problems:  Your pancreas does not make enough of a hormone called insulin.  Your body does not react in a normal way to insulin that it makes. Insulin lets sugars (glucose) go into cells in your body. This gives you energy. If you have diabetes, sugars cannot get into cells. This causes high blood sugar (hyperglycemia). Follow these instructions at home: How is diabetes treated? You may need to take insulin or other diabetes medicines daily to keep your blood sugar in balance. Take your diabetes medicines every day as told by your doctor. List your diabetes medicines here: Diabetes medicines  Name of medicine: ______________________________ ? Amount (dose): _______________ Time (a.m./p.m.): _______________ Notes: ___________________________________  Name of medicine: ______________________________ ? Amount (dose): _______________ Time (a.m./p.m.): _______________ Notes: ___________________________________  Name of medicine: ______________________________ ? Amount (dose): _______________ Time (a.m./p.m.): _______________ Notes: ___________________________________ If you use insulin, you will learn how to give yourself insulin by injection. You may need to adjust the amount based on the food that you eat. List the types of insulin you use here: Insulin  Insulin type: ______________________________ ? Amount (dose): _______________ Time (a.m./p.m.): _______________ Notes: ___________________________________   Insulin type: ______________________________ ? Amount (dose): _______________ Time (a.m./p.m.): _______________ Notes: ___________________________________  Insulin type: ______________________________ ?  Amount (dose): _______________ Time (a.m./p.m.): _______________ Notes: ___________________________________  Insulin type: ______________________________ ? Amount (dose): _______________ Time (a.m./p.m.): _______________ Notes: ___________________________________  Insulin type: ______________________________ ? Amount (dose): _______________ Time (a.m./p.m.): _______________ Notes: ___________________________________ How do I manage my blood sugar?  Check your blood sugar levels using a blood glucose monitor as directed by your doctor. Your doctor will set treatment goals for you. Generally, you should have these blood sugar levels:  Before meals (preprandial): 80-130 mg/dL (4.4-7.2 mmol/L).  After meals (postprandial): below 180 mg/dL (10 mmol/L).  A1c level: less than 7%. Write down the times that you will check your blood sugar levels: Blood sugar checks  Time: _______________ Notes: ___________________________________  Time: _______________ Notes: ___________________________________  Time: _______________ Notes: ___________________________________  Time: _______________ Notes: ___________________________________  Time: _______________ Notes: ___________________________________  Time: _______________ Notes: ___________________________________  What do I need to know about low blood sugar? Low blood sugar is called hypoglycemia. This is when blood sugar is at or below 70 mg/dL (3.9 mmol/L). Symptoms may include:  Feeling: ? Hungry. ? Worried or nervous (anxious). ? Sweaty and clammy. ? Confused. ? Dizzy. ? Sleepy. ? Sick to your stomach (nauseous).  Having: ? A fast heartbeat. ? A headache. ? A change in your vision. ? Tingling or no feeling (numbness) around  the mouth, lips, or tongue. ? Jerky movements that you cannot control (seizure).  Having trouble with: ? Moving (coordination). ? Sleeping. ? Passing out (fainting). ? Getting upset easily (irritability). Treating low blood sugar To treat low blood sugar, eat or drink something sugary right away. If you can think clearly and swallow safely, follow the 15:15 rule:  Take 15 grams of a fast-acting carb (carbohydrate). Talk with your doctor about how much you should take.  Some fast-acting carbs are: ? Sugar tablets (glucose pills). Take 3-4 glucose pills. ? 6-8 pieces of hard candy. ? 4-6 oz (120-150 mL) of fruit juice. ? 4-6 oz (120-150 mL) of regular (not diet) soda. ? 1 Tbsp (15 mL) honey or sugar.  Check your blood sugar 15 minutes after you take the carb.  If your blood sugar is still at or below 70 mg/dL (3.9 mmol/L), take 15 grams of a carb again.  If your blood sugar does not go above 70 mg/dL (3.9 mmol/L) after 3 tries, get help right away.  After your blood sugar goes back to normal, eat a meal or a snack within 1 hour. Treating very low blood sugar If your blood sugar is at or below 54 mg/dL (3 mmol/L), you have very low blood sugar (severe hypoglycemia). This is an emergency. Do not wait to see if the symptoms will go away. Get medical help right away. Call your local emergency services (911 in the [place] Do not drive yourself to the hospital. Questions to ask your health care provider  Do I need to meet with a diabetes educator?  What equipment will I need to care for myself at home?  What diabetes medicines do I need? When should I take them?  How often do I need to check my blood sugar?  What number can I call if I have questions?  When is my next doctor's visit?  Where can I find a support group for people with diabetes? Where to find more information  American Diabetes Association: www.diabetes.org  American Association of Diabetes Educators:  www.diabeteseducator.org/patient-resources Contact a doctor if:  Your blood sugar is at or above 240 mg/dL (13.3 mmol/L) for 2 days in a row.  You have been sick or have had a fever for 2 days or more, and you are not getting better.  You have any of these problems for more than 6 hours: ? You cannot eat or drink. ? You feel sick to your stomach (nauseous). ? You throw up (vomit). ? You have watery poop (diarrhea). Get help right away if:  Your blood sugar is lower than 54 mg/dL (3 mmol/L).  You get confused.  You have trouble: ? Thinking clearly. ? Breathing. Summary  Diabetes (diabetes mellitus) is a long-term (chronic) disease. It occurs when the body does not properly use sugar (glucose) that is released from food after digestion.  Take insulin and diabetes medicines as told.  Check your blood sugar every day, as often as told.  Keep all follow-up visits as told by your doctor. This is important. This information is not intended to replace advice given to you by your health care provider. Make sure you discuss any questions you have with your health care provider. Document Revised: 05/25/2019 Document Reviewed: 12/04/2017 Elsevier Patient Education  [place].

## 2020-08-22 ENCOUNTER — Ambulatory Visit: Payer: Medicaid Other

## 2020-08-22 ENCOUNTER — Other Ambulatory Visit: Payer: Self-pay

## 2020-08-22 LAB — TB SKIN TEST
Induration: 0 mm
TB Skin Test: NEGATIVE

## 2020-08-22 NOTE — Progress Notes (Signed)
PPD READ -NEGATIVE

## 2020-09-11 ENCOUNTER — Ambulatory Visit: Payer: Medicaid Other | Admitting: Family Medicine

## 2020-09-18 DIAGNOSIS — G4733 Obstructive sleep apnea (adult) (pediatric): Secondary | ICD-10-CM | POA: Diagnosis not present

## 2020-09-19 ENCOUNTER — Other Ambulatory Visit: Payer: Self-pay | Admitting: Cardiology

## 2020-09-19 MED FILL — METHOCARBAMOL 500 MG TABS: 500 | 30 days supply | Qty: 90 | Fill #1

## 2020-09-19 MED FILL — XARELTO 20 MG TABLET: 20 | 90 days supply | Qty: 90 | Fill #1

## 2020-09-19 MED FILL — METOPROLOL TARTRATE 25 MG T: 25 | 90 days supply | Qty: 180 | Fill #1

## 2020-09-19 MED FILL — ATORVASTATIN CALCIUM 80 MG: 80 | 90 days supply | Qty: 90 | Fill #2

## 2020-09-19 MED FILL — CARTIA XT 300 MG CAPSULE SA: 300 | 90 days supply | Qty: 90 | Fill #1

## 2020-09-19 MED FILL — BUTALB-ACETAMIN-CAFF 50-325: 50-325-40 | 20 days supply | Qty: 40 | Fill #1

## 2020-09-20 ENCOUNTER — Other Ambulatory Visit: Payer: Self-pay | Admitting: Cardiology

## 2020-09-20 ENCOUNTER — Other Ambulatory Visit: Payer: Self-pay

## 2020-09-20 MED FILL — FUROSEMIDE 20 MG TABS: 20 | 90 days supply | Qty: 90 | Fill #0

## 2020-09-20 NOTE — Patient Outreach (Signed)
Chart Review for [name]. Patient didn't answer phone   Type 2 DM Lab Results  Component Value Date   HGBA1C 5.9 01/18/2020    Metformin 500mg  BID(Non-compliant per patient) December 2021 Plan: Bothers her stomach, only taking QD since patient states she only eats 1 large meal/day January 2022) and snacks instead of eating B/L. Will ask PCP to change to Metformin ER 500mg  QD to improve compliance Jan 2022: No response from PCP regarding ER vs IR. Called Pharmacy, patient has only been getting IR. Re-contacted PCP regarding Metformin dosage form and will defer to her. Will f/u next month

## 2020-09-25 ENCOUNTER — Other Ambulatory Visit: Payer: Self-pay

## 2020-09-25 ENCOUNTER — Emergency Department (HOSPITAL_COMMUNITY)
Admission: EM | Admit: 2020-09-25 | Discharge: 2020-09-25 | Disposition: A | Discharge status: Home / Self Care | Payer: Medicaid Other | Attending: Emergency Medicine | Admitting: Emergency Medicine

## 2020-09-25 ENCOUNTER — Other Ambulatory Visit (HOSPITAL_COMMUNITY): Payer: Self-pay | Admitting: Physician Assistant

## 2020-09-25 DIAGNOSIS — I5032 Chronic diastolic (congestive) heart failure: Secondary | ICD-10-CM | POA: Insufficient documentation

## 2020-09-25 DIAGNOSIS — Z7984 Long term (current) use of oral hypoglycemic drugs: Secondary | ICD-10-CM | POA: Insufficient documentation

## 2020-09-25 DIAGNOSIS — Z87891 Personal history of nicotine dependence: Secondary | ICD-10-CM | POA: Diagnosis not present

## 2020-09-25 DIAGNOSIS — M542 Cervicalgia: Secondary | ICD-10-CM | POA: Diagnosis present

## 2020-09-25 DIAGNOSIS — Z79899 Other long term (current) drug therapy: Secondary | ICD-10-CM | POA: Insufficient documentation

## 2020-09-25 DIAGNOSIS — M62838 Other muscle spasm: Secondary | ICD-10-CM | POA: Diagnosis not present

## 2020-09-25 DIAGNOSIS — I110 Hypertensive heart disease with heart failure: Secondary | ICD-10-CM | POA: Insufficient documentation

## 2020-09-25 DIAGNOSIS — Z8673 Personal history of transient ischemic attack (TIA), and cerebral infarction without residual deficits: Secondary | ICD-10-CM | POA: Insufficient documentation

## 2020-09-25 DIAGNOSIS — R7303 Prediabetes: Secondary | ICD-10-CM | POA: Insufficient documentation

## 2020-09-25 MED ORDER — CYCLOBENZAPRINE HCL 5 MG PO TABS
ORAL_TABLET | ORAL | 0 refills | Status: DC
Start: 2020-09-25 — End: 2020-09-25

## 2020-09-25 MED ORDER — OXYCODONE-ACETAMINOPHEN 5-325 MG PO TABS
1.0000 | ORAL_TABLET | Freq: Once | ORAL | Status: AC
  Administered 2020-09-25: 1 via ORAL
  Filled 2020-09-25: qty 1

## 2020-09-25 MED ORDER — DICLOFENAC SODIUM 1 % EX GEL
2.0000 g | Freq: Four times a day (QID) | CUTANEOUS | 0 refills | Status: DC
Start: 2020-09-25 — End: 2020-09-25

## 2020-09-25 MED ORDER — LIDOCAINE HCL 2 % IJ SOLN
10.0000 mL | Freq: Once | INTRAMUSCULAR | Status: AC
  Administered 2020-09-25: 200 mg via INTRADERMAL
  Filled 2020-09-25: qty 20

## 2020-09-25 NOTE — ED Provider Notes (Signed)
[facility] EMERGENCY DEPARTMENT Provider Note   CSN: [id number] Arrival date & time: 09/25/20  1455     History Chief Complaint  Patient presents with  . Neck Pain    [name] is a 55 y.o. female.  HPI   55 y/o F with a h/o VTE, HTN, heart murmur, NSVT, paroxysmal afib, CVA, who presents to the ED today for eval of neck pain.  She states for the last 3 weeks she has had pain to the bilateral neck.  Initially started out as right neck pain is described as a crick in her neck.  The pain radiates to the right trapezius area into the right upper extremity.  It is now also radiating to the left trapezius.  She reports a headache that seems to wrap around her bilateral temples and forehead as well.  She describes as a throbbing headache.  She denies any recent falls, trauma or other injuries.  She states that she does lay with a lot of pillows elevating her head and watches television.  The pain is worse with movement.  She has tried massage without significant relief.  She is also tried taking some face Fioricet at home without significant relief.  She denies any vision changes, unilateral numbness/weakness or any other strokelike symptoms.  Past Medical History:  Diagnosis Date  . Clotting disorder (HCC)    on xarelto  . Essential hypertension   . Heart murmur    a. 10/2015 Echo: EF 60-65%, no rwma, mild AI/MR, sev dil LA/RA, PASP .  [name] Noncompliance   . NSVT (nonsustained ventricular tachycardia) (HCC)    a. 10/2015 during admission for CVA/AF.  [name] Paroxysmal A-fib (HCC)    a. CHA2DS2VASC = 4-->Xarelto;  b. 02/2016 Successful DCCV.  [name] Pneumonia 2012 x 2; 2015  . Stroke [facility])    a. 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  . Transient ischemic attack (TIA) 01/2013    Patient Active Problem List   Diagnosis Date Noted  . Longstanding persistent atrial fibrillation (HCC)  04/16/2020  . Secondary hypercoagulable state (HCC) 04/16/2020  . Edema leg 05/17/2019  . Severe obesity (BMI 35.0-39.9) with comorbidity (HCC) 04/04/2019  . Prediabetes 04/04/2019  . Seasonal allergic rhinitis due to pollen 12/14/2018  . Cough 12/14/2018  . Chronic back pain 08/23/2018  . Chronic diastolic CHF (congestive heart failure) (HCC) 06/23/2018  . OSA on CPAP 06/23/2018  . Acute ischemic right MCA stroke (HCC) 02/08/2018  . Headache 01/29/2017  . Refractive errors 04/15/2016  . Medical non-compliance 11/06/2015  . Hyperlipidemia LDL goal <70 11/06/2015  . 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 NSVT (nonsustained ventricular tachycardia) (HCC) 11/06/2015  . History of CVA (cerebrovascular accident)   . Claudication of both lower extremities (HCC) 03/22/2015  . Chronic atrial fibrillation (HCC) 04/12/2014  . Pap smear for cervical cancer screening 03/28/2014  . Cigarette smoker 06/15/2013  . Ventral hernia 06/15/2013  . Chronic anticoagulation 02/28/2013  . Hx-TIA (transient ischemic attack) 02/05/13 02/15/2013  . Essential hypertension 02/15/2013    Past Surgical History:  Procedure Laterality Date  . CARDIOVERSION N/A 02/18/2013   Procedure: CARDIOVERSION;  Surgeon: [name], MD;  Location: [facility] ENDOSCOPY;  Service: Cardiovascular;  Laterality: N/A;  . CARDIOVERSION N/A 02/23/2013   Procedure: CARDIOVERSION;  Surgeon: Marykay Lex, MD;  Location: [facility] OR;  Service: Cardiovascular;  Laterality: N/A;  BESIDE CV  . CARDIOVERSION N/A 03/12/2016   Procedure: CARDIOVERSION;  Surgeon: Larey Dresser, MD;  Location: [facility];  Service: Cardiovascular;  Laterality: N/A;  . TEE WITHOUT CARDIOVERSION N/A 02/18/2013   Procedure: TRANSESOPHAGEAL ECHOCARDIOGRAM (TEE);  Surgeon: Sanda Klein, MD;  Location: [facility] ENDOSCOPY;  Service: Cardiovascular;  Laterality: N/A;  . TUBAL LIGATION  1992     OB History   No obstetric history on file.     Family History  Problem  Relation Age of Onset  . Cancer Mother   . Atrial fibrillation Mother   . Breast cancer Mother   . Hypertension Father   . Atrial fibrillation Father   . Peripheral vascular disease Father   . Hypertension Other   . Diabetes Other   . Heart attack Neg Hx   . Stroke Neg Hx   . Rectal cancer Neg Hx   . Colon cancer Neg Hx   . Esophageal cancer Neg Hx   . Stomach cancer Neg Hx     Social History   Tobacco Use  . Smoking status: Former Smoker    Years: 3.00    Types: Cigarettes    Quit date: 03/15/2013    Years since quitting: 7.5  . Smokeless tobacco: Never Used  Vaping Use  . Vaping Use: Never used  Substance Use Topics  . Alcohol use: Yes    Comment: drinks on the weekends,   2-3 beers  . Drug use: No    Home Medications Prior to Admission medications   Medication Sig Start Date End Date Taking? Authorizing Provider  cyclobenzaprine (FLEXERIL) 5 MG tablet You may take 1 tablet up to three times daily as needed. Do not drive, operate machinery, or work while taking this medication. 09/25/20  Yes Rhapsody Wolven S, PA-C  diclofenac Sodium (VOLTAREN) 1 % GEL Apply 2 g topically 4 (four) times daily. 09/25/20  Yes Ellieanna Funderburg S, PA-C  Accu-Chek FastClix Lancets MISC USE AS INSTRUCTED TO CHECK BLOOD SUGAR ONCE DAILY. 03/07/20   [name], MD  ACCU-CHEK GUIDE test strip USE AS INSTRUCTED TO CHECK BLOOD SUGAR ONCE DAILY. 03/13/20   [name], MD  acetaminophen (TYLENOL) 500 MG tablet Take 1,000 mg by mouth every 6 (six) hours as needed for headache.     [provider]  albuterol (PROVENTIL HFA;VENTOLIN HFA) 108 (90 Base) MCG/ACT inhaler Inhale 2 puffs into the lungs every 6 (six) hours as needed for wheezing or shortness of breath. 11/17/18   [name], MD  atorvastatin (LIPITOR) 80 MG tablet Take 1 tablet (80 mg total) by mouth daily at 6 PM. Patient not taking: Reported on 08/20/2020 01/18/20   [name], MD  Blood Glucose Monitoring Suppl (ACCU-CHEK  GUIDE ME) w/Device KIT 1 kit by Does not apply route daily. 05/02/19   [name], MD  butalbital-acetaminophen-caffeine (FIORICET) 50-325-40 MG tablet TAKE ONE TABLET EVERY 12 HOURS AS NEEDED 11/02/19   [name], MD  cetirizine (ZYRTEC) 10 MG tablet Take 1 tablet (10 mg total) by mouth daily. Patient not taking: Reported on 08/16/2020 12/14/18   [name] Stain, MD  diltiazem (CARDIZEM CD) 300 MG 24 hr capsule Take 1 capsule (300 mg total) by mouth daily. 01/18/20 08/16/20  [name], MD  fluticasone (FLONASE) 50 MCG/ACT nasal spray Place 2 sprays into both nostrils daily. 12/14/18   [name] Stain, MD  furosemide (LASIX) 20 MG tablet TAKE 1 TABLET (20 MG TOTAL) BY MOUTH AS NEEDED. 09/20/20   Erlene Quan, PA-C  gabapentin (NEURONTIN) 300 MG capsule TAKE 1 CAPSULE BY MOUTH  2 TIMES DAILY. Patient taking differently: Take 300 mg by mouth 2 (two) times daily.  11/04/19   [name], MD  hydrochlorothiazide (MICROZIDE) 12.5 MG capsule TAKE 1 CAPSULE (12.5 MG TOTAL) BY MOUTH DAILY. 09/07/19 08/16/20  Erlene Quan, PA-C  metFORMIN (GLUCOPHAGE) 500 MG tablet Take 1 tablet (500 mg total) by mouth 2 (two) times daily with a meal. Patient not taking: Reported on 08/20/2020 01/18/20   [name], MD  methocarbamol (ROBAXIN) 500 MG tablet Take 1 tablet (500 mg total) by mouth every 8 (eight) hours as needed for muscle spasms. 10/20/19   [name], MD  metoprolol tartrate (LOPRESSOR) 25 MG tablet Take 1 tablet (25 mg total) by mouth 2 (two) times daily. 04/10/20 08/16/20  Barrett, Evelene Croon, PA-C  metroNIDAZOLE (METROGEL VAGINAL) 0.75 % vaginal gel Place 1 Applicatorful vaginally at bedtime. Patient not taking: Reported on 08/16/2020 06/25/20   [name], MD  Misc. Devices MISC Blood pressure monitor.  Diagnosis Hypertension 02/11/19   [name], MD  rivaroxaban (XARELTO) 20 MG TABS tablet Take 1 tablet (20 mg total) by mouth daily with supper. 01/18/20   [name], MD     Allergies    Penicillins  Review of Systems   Review of Systems  Constitutional: Negative for fever.  HENT: Negative for ear pain and sore throat.   Eyes: Negative for visual disturbance.  Respiratory: Negative for cough and shortness of breath.   Cardiovascular: Negative for chest pain.  Gastrointestinal: Negative for abdominal pain, nausea and vomiting.  Genitourinary: Negative for dysuria and hematuria.  Musculoskeletal: Positive for neck pain.  Skin: Negative for rash.  Neurological: Positive for headaches. Negative for weakness and numbness.  All other systems reviewed and are negative.   Physical Exam Updated Vital Signs BP 120/75   Pulse 71   Temp 97.8 F (36.6 C) (Oral)   Resp 20   Ht $R'5\' 4"'Ae$  (1.626 m)   Wt 98.9 kg   SpO2 96%   BMI 37.42 kg/m   Physical Exam Vitals and nursing note reviewed.  Constitutional:      General: She is not in acute distress.    Appearance: She is well-developed and well-nourished.  HENT:     Head: Normocephalic and atraumatic.  Eyes:     Conjunctiva/sclera: Conjunctivae normal.  Cardiovascular:     Rate and Rhythm: Normal rate and regular rhythm.     Heart sounds: Normal heart sounds. No murmur heard.   Pulmonary:     Effort: Pulmonary effort is normal. No respiratory distress.     Breath sounds: Normal breath sounds. No wheezing, rhonchi or rales.  Abdominal:     General: Bowel sounds are normal.     Palpations: Abdomen is soft.     Tenderness: There is no abdominal tenderness.  Musculoskeletal:        General: No edema.     Cervical back: Neck supple.     Comments: TTP to the bilat cervical paraspinous muscles and bilat trapezius muscles which reproduces pain. 2+ radial pulses.   Skin:    General: Skin is warm and dry.  Neurological:     Mental Status: She is alert.     Comments: Mental Status:  Alert, thought content appropriate, able to give a coherent history. Speech fluent without evidence of aphasia. Able to  follow 2 step commands without difficulty.  Cranial Nerves:  II: pupils equal, round, reactive to light III,IV, VI: ptosis not present, extra-ocular motions intact bilaterally  V,VII: smile  symmetric, facial light touch sensation equal VIII: hearing grossly normal to voice  X: uvula elevates symmetrically  XI: bilateral shoulder shrug symmetric and strong XII: midline tongue extension without fassiculations Motor:  Normal tone. 5/5 strength of BUE and BLE major muscle groups including strong and equal grip strength and dorsiflexion/plantar flexion Sensory: light touch normal in all extremities.    Psychiatric:        Mood and Affect: Mood and affect normal.     ED Results / Procedures / Treatments   Labs (all labs ordered are listed, but only abnormal results are displayed) Labs Reviewed - No data to display  EKG None  Radiology No results found.  Procedures Procedures (including critical care time)  Medications Ordered in ED Medications  lidocaine (XYLOCAINE) 2 % (with pres) injection 200 mg (has no administration in time range)  oxyCODONE-acetaminophen (PERCOCET/ROXICET) 5-325 MG per tablet 1 tablet (has no administration in time range)    ED Course  I have reviewed the triage vital signs and the nursing notes.  Pertinent labs & imaging results that were available during my care of the patient were reviewed by me and considered in my medical decision making (see chart for details).    MDM Rules/Calculators/A&P                          55 y/o F presenting for eval of neck pain describes as muscle spasms.  Patient has reproducible tenderness to the bilateral cervical paraspinous muscles and bilateral trapezius muscles.  Neurovascularly she is intact.  Suspect this is MSK cause and doubt any vascular emergency, stroke or other concerning etiology of symptoms.  Trigger point injections were performed in the ED and patient did have some relief following this.  She also  given a dose of pain medications.  We will discharge her on Flexeril and Voltaren gel.  I also advised heat at home and close follow-up with PCP.  Advised on return precautions.  She voices understanding the plan was to return.  All questions answered.  Patient stable for discharge.  Final Clinical Impression(s) / ED Diagnoses Final diagnoses:  Muscle spasms of neck    Rx / DC Orders ED Discharge Orders         Ordered    cyclobenzaprine (FLEXERIL) 5 MG tablet        09/25/20 2322    diclofenac Sodium (VOLTAREN) 1 % GEL  4 times daily        09/25/20 2322           Rodney Booze, PA-C 09/25/20 2322    Blanchie Dessert, MD 09/29/20 1407

## 2020-09-25 NOTE — Discharge Instructions (Signed)
You were given a prescription for Flexeril which is a muscle relaxer.  You should not drive, work, or operate machinery while taking this medication as it can make you very drowsy.  Do not take this medication with other muscle relaxers.   Use the antiinflammatory gel as directed.  You can also use heat, light stretching and massage of the area.   Please follow up with your primary care provider within 5-7 days for re-evaluation of your symptoms. If you do not have a primary care provider, information for a healthcare clinic has been provided for you to make arrangements for follow up care. Please return to the emergency department for any new or worsening symptoms.

## 2020-09-25 NOTE — ED Notes (Signed)
Lidocaine at bedside.

## 2020-09-25 NOTE — ED Triage Notes (Signed)
Pt reports a crick in her neck x 3 weeks. As a result has developed a headache. Hx of muscle spasms, for which she is prescribed a muscle relaxer; however, is not effective at this time.

## 2020-09-26 ENCOUNTER — Telehealth: Payer: Self-pay

## 2020-09-26 MED FILL — DICLOFENAC SODIUM 1% GEL: 1 | 12 days supply | Qty: 100 | Fill #0

## 2020-09-26 MED FILL — CYCLOBENZAPRINE 5 MG TABLET: 5 | 3 days supply | Qty: 10 | Fill #0

## 2020-09-26 NOTE — Telephone Encounter (Signed)
Transition Care Management Follow-up Telephone Call  Date of discharge and from where: 09/25/2020 [name]  How have you been since you were released from the hospital? Patient stated she was doing good, just a bit drowsy. Has not picked up medications yet as they were sent in over night.    Any questions or concerns? No  Items Reviewed:  Did the pt receive and understand the discharge instructions provided? Yes   Medications obtained and verified? Will pick up later today.  Other? No   Any new allergies since your discharge? No   Dietary orders reviewed? Yes  Do you have support at home? Yes     Functional Questionnaire: (I = Independent and D = Dependent) ADLs: I  Bathing/Dressing- I  Meal Prep- I  Eating- I  Maintaining continence- I  Transferring/Ambulation- I  Managing Meds- I  Follow up appointments reviewed:   PCP Hospital f/u appt confirmed? Yes  Scheduled to see [name], MD on 10/30/2020 @ 9:10am.  [facility] f/u appt confirmed? No    Are transportation arrangements needed? No   If their condition worsens, is the pt aware to call PCP or go to the Emergency Dept.? Yes  Was the patient provided with contact information for the PCP's office or ED? Yes  Was to pt encouraged to call back with questions or concerns? Yes

## 2020-10-03 ENCOUNTER — Other Ambulatory Visit: Payer: Self-pay | Admitting: Emergency Medicine

## 2020-10-03 ENCOUNTER — Emergency Department (HOSPITAL_COMMUNITY)
Admission: EM | Admit: 2020-10-03 | Discharge: 2020-10-03 | Disposition: A | Discharge status: Home / Self Care | Payer: Medicaid Other | Attending: Emergency Medicine | Admitting: Emergency Medicine

## 2020-10-03 ENCOUNTER — Other Ambulatory Visit: Payer: Self-pay

## 2020-10-03 ENCOUNTER — Encounter (HOSPITAL_COMMUNITY): Payer: Self-pay

## 2020-10-03 DIAGNOSIS — I110 Hypertensive heart disease with heart failure: Secondary | ICD-10-CM | POA: Insufficient documentation

## 2020-10-03 DIAGNOSIS — J0110 Acute frontal sinusitis, unspecified: Secondary | ICD-10-CM | POA: Diagnosis not present

## 2020-10-03 DIAGNOSIS — R519 Headache, unspecified: Secondary | ICD-10-CM | POA: Diagnosis not present

## 2020-10-03 DIAGNOSIS — R7303 Prediabetes: Secondary | ICD-10-CM | POA: Insufficient documentation

## 2020-10-03 DIAGNOSIS — Z87891 Personal history of nicotine dependence: Secondary | ICD-10-CM | POA: Insufficient documentation

## 2020-10-03 DIAGNOSIS — Z79899 Other long term (current) drug therapy: Secondary | ICD-10-CM | POA: Diagnosis not present

## 2020-10-03 DIAGNOSIS — Z7984 Long term (current) use of oral hypoglycemic drugs: Secondary | ICD-10-CM | POA: Insufficient documentation

## 2020-10-03 DIAGNOSIS — Z7901 Long term (current) use of anticoagulants: Secondary | ICD-10-CM | POA: Diagnosis not present

## 2020-10-03 DIAGNOSIS — I5032 Chronic diastolic (congestive) heart failure: Secondary | ICD-10-CM | POA: Diagnosis not present

## 2020-10-03 DIAGNOSIS — R059 Cough, unspecified: Secondary | ICD-10-CM | POA: Insufficient documentation

## 2020-10-03 DIAGNOSIS — M542 Cervicalgia: Secondary | ICD-10-CM | POA: Diagnosis not present

## 2020-10-03 DIAGNOSIS — G44209 Tension-type headache, unspecified, not intractable: Secondary | ICD-10-CM

## 2020-10-03 LAB — BASIC METABOLIC PANEL
Anion gap: 11 (ref 5–15)
BUN: 12 mg/dL (ref 6–20)
CO2: 23 mmol/L (ref 22–32)
Calcium: 9.1 mg/dL (ref 8.9–10.3)
Chloride: 106 mmol/L (ref 98–111)
Creatinine, Ser: 0.88 mg/dL (ref 0.44–1.00)
GFR, Estimated: >60 mL/min (ref >60)
Glucose, Bld: 117 mg/dL — ABNORMAL HIGH (ref 70–99)
Potassium: 3.4 mmol/L — ABNORMAL LOW (ref 3.5–5.1)
Sodium: 140 mmol/L (ref 135–145)

## 2020-10-03 LAB — CBC
HCT: 44.1 % (ref 36.0–46.0)
Hemoglobin: 13.7 g/dL (ref 12.0–15.0)
MCH: 26.1 pg (ref 26.0–34.0)
MCHC: 31.1 g/dL (ref 30.0–36.0)
MCV: 84 fL (ref 80.0–100.0)
Platelets: 220 10*3/uL (ref 150–400)
RBC: 5.25 MIL/uL — ABNORMAL HIGH (ref 3.87–5.11)
RDW: 15 % (ref 11.5–15.5)
WBC: 10.9 10*3/uL — ABNORMAL HIGH (ref 4.0–10.5)
nRBC: 0 % (ref 0.0–0.2)

## 2020-10-03 LAB — BRAIN NATRIURETIC PEPTIDE: B Natriuretic Peptide: 98.8 pg/mL (ref 0.0–100.0)

## 2020-10-03 MED ORDER — PROCHLORPERAZINE EDISYLATE 10 MG/2ML IJ SOLN
10.0000 mg | Freq: Once | INTRAMUSCULAR | Status: AC
  Administered 2020-10-03: 10 mg via INTRAMUSCULAR
  Filled 2020-10-03: qty 2

## 2020-10-03 MED ORDER — DOXYCYCLINE HYCLATE 100 MG PO TABS
100.0000 mg | ORAL_TABLET | Freq: Once | ORAL | Status: AC
  Administered 2020-10-03: 100 mg via ORAL
  Filled 2020-10-03: qty 1

## 2020-10-03 MED ORDER — DIPHENHYDRAMINE HCL 50 MG/ML IJ SOLN
25.0000 mg | Freq: Once | INTRAMUSCULAR | Status: AC
  Administered 2020-10-03: 25 mg via INTRAMUSCULAR
  Filled 2020-10-03: qty 1

## 2020-10-03 MED ORDER — DOXYCYCLINE HYCLATE 100 MG PO CAPS: 100.0000 mg | ORAL_CAPSULE | Freq: Two times a day (BID) | ORAL | 0 refills | Status: DC

## 2020-10-03 MED ORDER — KETOROLAC TROMETHAMINE 60 MG/2ML IM SOLN
15.0000 mg | Freq: Once | INTRAMUSCULAR | Status: AC
  Administered 2020-10-03: 15 mg via INTRAMUSCULAR
  Filled 2020-10-03: qty 2

## 2020-10-03 MED FILL — DICLOFENAC SODIUM 1% GEL: 1 | 12 days supply | Qty: 100 | Fill #0

## 2020-10-03 MED FILL — DOXYCYCLINE HYCLATE 100 MG: 100 | 7 days supply | Qty: 14 | Fill #0

## 2020-10-03 MED FILL — CYCLOBENZAPRINE 5 MG TABLET: 5 | 3 days supply | Qty: 10 | Fill #0

## 2020-10-03 NOTE — ED Provider Notes (Signed)
[facility] EMERGENCY DEPARTMENT Provider Note   CSN: [id number] Arrival date & time: 10/03/20  0825     History Chief Complaint  Patient presents with  . Headache  . Leg Swelling    [name] is a 55 y.o. female.  55 yo F with a chief complaints of a headache.  Patient typically gets headaches but feels like this is different than her normal.  Going on for the past 3 weeks.  Bilateral frontal and bilateral occipital.  She had been seen in the ED about a week ago for the same.  Was diagnosed with neck spasms and given a injection into the neck with improvement.  Patient is try to get in with her family doctor but unfortunately has been unable to get an appointment.  Has had persistent headaches.  She denies fevers.  Has been lightheaded at times.  Has had some coughing and congestion.  No trauma.  Able to move her neck without issue.  No chest pain or shortness of breath.  She also feels that her hands and feet are swollen.  Has had some sharp pains to her legs off and on lasting for less than a second at a time.  None currently.  The history is provided by the patient.  Headache Pain location:  Frontal and occipital Quality:  Dull Radiates to:  Does not radiate Severity currently:  8/10 Severity at highest:  8/10 Onset quality:  Gradual Duration:  3 weeks Timing:  Constant Progression:  Worsening Chronicity:  New Similar to prior headaches: no   Relieved by:  Nothing Worsened by:  Nothing Ineffective treatments:  None tried Associated symptoms: congestion, cough and neck pain   Associated symptoms: no dizziness, no fever, no myalgias, no nausea and no vomiting        Past Medical History:  Diagnosis Date  . Clotting disorder ([place])    on xarelto  . Essential hypertension   . Heart murmur    a. 10/2015 Echo: EF 60-65%, no rwma, mild AI/MR, sev dil LA/RA, PASP 52mmHg.  [name] Noncompliance   . NSVT (nonsustained ventricular tachycardia) ([place])    a.  10/2015 during admission for CVA/AF.  [name] Paroxysmal A-fib ([place])    a. CHA2DS2VASC = 4-->Xarelto;  b. 02/2016 Successful DCCV.  [name] Pneumonia 2012 x 2; 2015  . Stroke [facility])    a. 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  . Transient ischemic attack (TIA) 01/2013    Patient Active Problem List   Diagnosis Date Noted  . Longstanding persistent atrial fibrillation ([place]) 04/16/2020  . Secondary hypercoagulable state ([place]) 04/16/2020  . Edema leg 05/17/2019  . Severe obesity (BMI 35.0-39.9) with comorbidity ([place]) 04/04/2019  . Prediabetes 04/04/2019  . Seasonal allergic rhinitis due to pollen 12/14/2018  . Cough 12/14/2018  . Chronic back pain 08/23/2018  . Chronic diastolic CHF (congestive heart failure) ([place]) 06/23/2018  . OSA on CPAP 06/23/2018  . Acute ischemic right MCA stroke ([place]) 02/08/2018  . Headache 01/29/2017  . Refractive errors 04/15/2016  . Medical non-compliance 11/06/2015  . Hyperlipidemia LDL goal <70 11/06/2015  . 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 NSVT (nonsustained ventricular tachycardia) ([place]) 11/06/2015  . History of CVA (cerebrovascular accident)   . Claudication of both lower extremities ([place]) 03/22/2015  . Chronic atrial fibrillation ([place]) 04/12/2014  . Pap smear for cervical cancer screening 03/28/2014  . Cigarette smoker 06/15/2013  . Ventral hernia 06/15/2013  .  Chronic anticoagulation 02/28/2013  . Hx-TIA (transient ischemic attack) 02/05/13 02/15/2013  . Essential hypertension 02/15/2013    Past Surgical History:  Procedure Laterality Date  . CARDIOVERSION N/A 02/18/2013   Procedure: CARDIOVERSION;  Surgeon: Sanda Klein, MD;  Location: [facility] ENDOSCOPY;  Service: Cardiovascular;  Laterality: N/A;  . CARDIOVERSION N/A 02/23/2013   Procedure: CARDIOVERSION;  Surgeon: Leonie Man, MD;  Location: [facility];  Service: Cardiovascular;  Laterality: N/A;  BESIDE CV  .  CARDIOVERSION N/A 03/12/2016   Procedure: CARDIOVERSION;  Surgeon: Larey Dresser, MD;  Location: [facility];  Service: Cardiovascular;  Laterality: N/A;  . TEE WITHOUT CARDIOVERSION N/A 02/18/2013   Procedure: TRANSESOPHAGEAL ECHOCARDIOGRAM (TEE);  Surgeon: Sanda Klein, MD;  Location: [facility] ENDOSCOPY;  Service: Cardiovascular;  Laterality: N/A;  . TUBAL LIGATION  1992     OB History   No obstetric history on file.     Family History  Problem Relation Age of Onset  . Cancer Mother   . Atrial fibrillation Mother   . Breast cancer Mother   . Hypertension Father   . Atrial fibrillation Father   . Peripheral vascular disease Father   . Hypertension Other   . Diabetes Other   . Heart attack Neg Hx   . Stroke Neg Hx   . Rectal cancer Neg Hx   . Colon cancer Neg Hx   . Esophageal cancer Neg Hx   . Stomach cancer Neg Hx     Social History   Tobacco Use  . Smoking status: Former Smoker    Years: 3.00    Types: Cigarettes    Quit date: 03/15/2013    Years since quitting: 7.5  . Smokeless tobacco: Never Used  Vaping Use  . Vaping Use: Never used  Substance Use Topics  . Alcohol use: Yes    Comment: drinks on the weekends,   2-3 beers  . Drug use: No    Home Medications Prior to Admission medications   Medication Sig Start Date End Date Taking? Authorizing Provider  doxycycline (VIBRAMYCIN) 100 MG capsule Take 1 capsule (100 mg total) by mouth 2 (two) times daily. One po bid x 7 days 10/03/20  Yes Deno Etienne, DO  Accu-Chek FastClix Lancets MISC USE AS INSTRUCTED TO CHECK BLOOD SUGAR ONCE DAILY. 03/07/20   [name], MD  ACCU-CHEK GUIDE test strip USE AS INSTRUCTED TO CHECK BLOOD SUGAR ONCE DAILY. 03/13/20   [name], MD  acetaminophen (TYLENOL) 500 MG tablet Take 1,000 mg by mouth every 6 (six) hours as needed for headache.     [provider]  albuterol (PROVENTIL HFA;VENTOLIN HFA) 108 (90 Base) MCG/ACT inhaler Inhale 2 puffs into the lungs every 6 (six)  hours as needed for wheezing or shortness of breath. 11/17/18   [name], MD  atorvastatin (LIPITOR) 80 MG tablet Take 1 tablet (80 mg total) by mouth daily at 6 PM. Patient not taking: Reported on 08/20/2020 01/18/20   [name], MD  Blood Glucose Monitoring Suppl (ACCU-CHEK GUIDE ME) w/Device KIT 1 kit by Does not apply route daily. 05/02/19   [name], MD  butalbital-acetaminophen-caffeine (FIORICET) 50-325-40 MG tablet TAKE ONE TABLET EVERY 12 HOURS AS NEEDED 11/02/19   [name], MD  cetirizine (ZYRTEC) 10 MG tablet Take 1 tablet (10 mg total) by mouth daily. Patient not taking: Reported on 08/16/2020 12/14/18   [name] Stain, MD  cyclobenzaprine (FLEXERIL) 5 MG tablet You may take 1 tablet up to three times daily as needed. Do  not drive, operate machinery, or work while taking this medication. 09/25/20   Couture, Cortni S, PA-C  diclofenac Sodium (VOLTAREN) 1 % GEL Apply 2 g topically 4 (four) times daily. 09/25/20   Couture, Cortni S, PA-C  diltiazem (CARDIZEM CD) 300 MG 24 hr capsule Take 1 capsule (300 mg total) by mouth daily. 01/18/20 08/16/20  [name], MD  fluticasone (FLONASE) 50 MCG/ACT nasal spray Place 2 sprays into both nostrils daily. 12/14/18   [name] Stain, MD  furosemide (LASIX) 20 MG tablet TAKE 1 TABLET (20 MG TOTAL) BY MOUTH AS NEEDED. 09/20/20   Kilroy, Doreene Burke, PA-C  gabapentin (NEURONTIN) 300 MG capsule TAKE 1 CAPSULE BY MOUTH 2 TIMES DAILY. Patient taking differently: Take 300 mg by mouth 2 (two) times daily.  11/04/19   [name], MD  hydrochlorothiazide (MICROZIDE) 12.5 MG capsule TAKE 1 CAPSULE (12.5 MG TOTAL) BY MOUTH DAILY. 09/07/19 08/16/20  Erlene Quan, PA-C  metFORMIN (GLUCOPHAGE) 500 MG tablet Take 1 tablet (500 mg total) by mouth 2 (two) times daily with a meal. Patient not taking: Reported on 08/20/2020 01/18/20   [name], MD  methocarbamol (ROBAXIN) 500 MG tablet Take 1 tablet (500 mg total) by mouth every 8 (eight) hours  as needed for muscle spasms. 10/20/19   [name], MD  metoprolol tartrate (LOPRESSOR) 25 MG tablet Take 1 tablet (25 mg total) by mouth 2 (two) times daily. 04/10/20 08/16/20  Barrett, Evelene Croon, PA-C  metroNIDAZOLE (METROGEL VAGINAL) 0.75 % vaginal gel Place 1 Applicatorful vaginally at bedtime. Patient not taking: Reported on 08/16/2020 06/25/20   [name], MD  Misc. Devices MISC Blood pressure monitor.  Diagnosis Hypertension 02/11/19   [name], MD  rivaroxaban (XARELTO) 20 MG TABS tablet Take 1 tablet (20 mg total) by mouth daily with supper. 01/18/20   [name], MD    Allergies    Penicillins  Review of Systems   Review of Systems  Constitutional: Negative for chills and fever.  HENT: Positive for congestion. Negative for rhinorrhea.   Eyes: Negative for redness and visual disturbance.  Respiratory: Positive for cough. Negative for shortness of breath and wheezing.   Cardiovascular: Negative for chest pain and palpitations.  Gastrointestinal: Negative for nausea and vomiting.  Genitourinary: Negative for dysuria and urgency.  Musculoskeletal: Positive for neck pain. Negative for arthralgias and myalgias.  Skin: Negative for pallor and wound.  Neurological: Positive for headaches. Negative for dizziness.    Physical Exam Updated Vital Signs BP (!) 163/93   Pulse 95   Temp 98.2 F (36.8 C) (Oral)   Resp 16   Ht $R'5\' 4"'uv$  (1.626 m)   Wt 100.7 kg   SpO2 100%   BMI 38.11 kg/m   Physical Exam Vitals and nursing note reviewed.  Constitutional:      General: She is not in acute distress.    Appearance: She is well-developed and well-nourished. She is not diaphoretic.  HENT:     Head: Normocephalic and atraumatic.     Comments: Swollen turbinates, posterior nasal drip, no noted sinus ttp, tm normal bilaterally.   Eyes:     Extraocular Movements: EOM normal.     Pupils: Pupils are equal, round, and reactive to light.  Cardiovascular:     Rate and Rhythm:  Normal rate and regular rhythm.     Heart sounds: No murmur heard. No friction rub. No gallop.   Pulmonary:     Effort: Pulmonary effort is normal.     Breath  sounds: No wheezing or rales.  Abdominal:     General: There is no distension.     Palpations: Abdomen is soft.     Tenderness: There is no abdominal tenderness.  Musculoskeletal:        General: No tenderness or edema.     Cervical back: Normal range of motion and neck supple.  Skin:    General: Skin is warm and dry.  Neurological:     Mental Status: She is alert and oriented to person, place, and time.     GCS: GCS eye subscore is 4. GCS verbal subscore is 5. GCS motor subscore is 6.     Cranial Nerves: Cranial nerves are intact.     Sensory: Sensation is intact.     Motor: Motor function is intact.     Coordination: Coordination is intact.     Gait: Gait is intact.     Comments: Benign neuro exam  Psychiatric:        Mood and Affect: Mood and affect normal.        Behavior: Behavior normal.     ED Results / Procedures / Treatments   Labs (all labs ordered are listed, but only abnormal results are displayed) Labs Reviewed  BASIC METABOLIC PANEL - Abnormal; Notable for the following components:      Result Value   Potassium 3.4 (*)    Glucose, Bld 117 (*)    All other components within normal limits  CBC - Abnormal; Notable for the following components:   WBC 10.9 (*)    RBC 5.25 (*)    All other components within normal limits  BRAIN NATRIURETIC PEPTIDE    EKG None  Radiology No results found.  Procedures Procedures (including critical care time)  Medications Ordered in ED Medications  prochlorperazine (COMPAZINE) injection 10 mg (has no administration in time range)  diphenhydrAMINE (BENADRYL) injection 25 mg (has no administration in time range)  ketorolac (TORADOL) injection 15 mg (has no administration in time range)  doxycycline (VIBRA-TABS) tablet 100 mg (has no administration in time range)     ED Course  I have reviewed the triage vital signs and the nursing notes.  Pertinent labs & imaging results that were available during my care of the patient were reviewed by me and considered in my medical decision making (see chart for details).    MDM Rules/Calculators/A&P                          55 yo F with a chief complaints of a headache.  Going on for 3 weeks now.  Associated with cough and congestion.  I feel the 2 most likely diagnoses are either tension headaches or sinusitis.  She does have some signs of sinus congestion and posterior nasal drip on exam.  She has a benign neurologic exam for me.  No trauma.  We will do a course of antibiotics.  Headache cocktail here.  PCP follow-up.  10:46 AM:  I have discussed the diagnosis/risks/treatment options with the patient and believe the pt to be eligible for discharge home to follow-up with PCP. We also discussed returning to the ED immediately if new or worsening sx occur. We discussed the sx which are most concerning (e.g., sudden worsening pain, fever, inability to tolerate by mouth) that necessitate immediate return. Medications administered to the patient during their visit and any new prescriptions provided to the patient are listed below.  Medications given during this  visit Medications  prochlorperazine (COMPAZINE) injection 10 mg (has no administration in time range)  diphenhydrAMINE (BENADRYL) injection 25 mg (has no administration in time range)  ketorolac (TORADOL) injection 15 mg (has no administration in time range)  doxycycline (VIBRA-TABS) tablet 100 mg (has no administration in time range)     The patient appears reasonably screen and/or stabilized for discharge and I doubt any other medical condition or other [facility] requiring further screening, evaluation, or treatment in the ED at this time prior to discharge.    Final Clinical Impression(s) / ED Diagnoses Final diagnoses:  Frontal headache  Neck pain   Tension headache  Acute frontal sinusitis, recurrence not specified    Rx / DC Orders ED Discharge Orders         Ordered    doxycycline (VIBRAMYCIN) 100 MG capsule  2 times daily        10/03/20 [place], Denessa Cavan, DO 10/03/20 1046

## 2020-10-03 NOTE — Discharge Instructions (Addendum)
Take your headache medicine only when you get a headache not to prevent 1.  Please return for one-sided numbness or weakness difficulty with speech or swallowing or worsening dizziness.  Follow-up with your family doctor in the office.

## 2020-10-03 NOTE — ED Triage Notes (Signed)
Pt reports continued headache from last week, she was seen for muscle spasms in her neck. States the headache never went away. Pt also reports increased swelling in her hands and legs, causing a heaviness type pain. Takes gabapentin and lasix. Pt a.o, resp e.u

## 2020-10-04 ENCOUNTER — Telehealth: Payer: Self-pay

## 2020-10-04 NOTE — Telephone Encounter (Signed)
Transition Care Management Follow-up Telephone Call  Date of discharge and from where: 10/03/2020 [facility]  How have you been since you were released from the hospital? Doing better, patient stated her headache has eased up a bit, and she was able to start her new prescription.   Any questions or concerns? No  Items Reviewed:  Did the pt receive and understand the discharge instructions provided? Yes   Medications obtained and verified? Yes   Other? No   Any new allergies since your discharge? No   Dietary orders reviewed? Yes  Do you have support at home? Yes    Functional Questionnaire: (I = Independent and D = Dependent) ADLs: I  Bathing/Dressing- I  Meal Prep- I  Eating- I  Maintaining continence- I  Transferring/Ambulation- I  Managing Meds- I  Follow up appointments reviewed:   PCP Hospital f/u appt confirmed? Yes  Scheduled to see Dr. Margarita Rana on 10/30/2020 @ 9:10am.  [facility] f/u appt confirmed? No  Are transportation arrangements needed? No   If their condition worsens, is the pt aware to call PCP or go to the Emergency Dept.? Yes  Was the patient provided with contact information for the PCP's office or ED? Yes  Was to pt encouraged to call back with questions or concerns? Yes

## 2020-10-17 ENCOUNTER — Other Ambulatory Visit: Payer: Self-pay | Admitting: *Deleted

## 2020-10-17 ENCOUNTER — Other Ambulatory Visit: Payer: Self-pay

## 2020-10-17 NOTE — Patient Instructions (Signed)
[name],  Thank you for taking time to speak with me today about care coordination and care management services available to you at no cost as part of your Medicaid benefit. These services are voluntary. Our team is available to provide assistance regarding your health care needs at any time. Please do not hesitate to reach out to me if we can be of service to you at any time in the future. [place], BSN [place] RN Care Coordinator

## 2020-10-17 NOTE — Patient Outreach (Signed)
Care Coordination  10/17/2020  [name] Jul 11, 1966 [id number]   [name] was referred to the [facility] Managed Care High Risk team for assistance with care coordination and care management services. Care coordination/care management services as part of the Medicaid benefit was offered to the patient today. The patient declined assistance offered today.   Ms Astorino feels like she is managing her care and her medications and declines the need for continued Case Management/Care Coordination at this time  Plan: The [facility] team is available at any time in the future to assist with care coordination/care management services upon referral.   Lurena Joiner RN, BSN [place] RN Care Coordinator

## 2020-10-18 ENCOUNTER — Ambulatory Visit: Payer: Self-pay

## 2020-10-30 ENCOUNTER — Telehealth: Payer: Medicaid Other | Admitting: Family Medicine

## 2020-11-23 ENCOUNTER — Other Ambulatory Visit: Payer: Self-pay | Admitting: Family Medicine

## 2020-11-23 DIAGNOSIS — R29898 Other symptoms and signs involving the musculoskeletal system: Secondary | ICD-10-CM

## 2020-11-23 MED FILL — GABAPENTIN 300 MG CAPSULE: 300 | 45 days supply | Qty: 90 | Fill #0

## 2020-12-03 DIAGNOSIS — Z1152 Encounter for screening for COVID-19: Secondary | ICD-10-CM | POA: Diagnosis not present

## 2020-12-15 ENCOUNTER — Other Ambulatory Visit: Payer: Self-pay

## 2020-12-25 ENCOUNTER — Telehealth: Payer: Medicaid Other | Admitting: Family Medicine

## 2020-12-31 MED FILL — Rivaroxaban Tab 20 MG: ORAL | 30 days supply | Qty: 30 | Fill #0 | Status: AC

## 2021-01-01 ENCOUNTER — Other Ambulatory Visit: Payer: Self-pay

## 2021-01-01 MED FILL — Gabapentin Cap 300 MG: ORAL | 45 days supply | Qty: 90 | Fill #0 | Status: AC

## 2021-01-01 MED FILL — Gabapentin Cap 300 MG: ORAL | 90 days supply | Qty: 90 | Fill #0 | Status: CN

## 2021-01-03 ENCOUNTER — Other Ambulatory Visit: Payer: Self-pay

## 2021-02-05 ENCOUNTER — Other Ambulatory Visit: Payer: Self-pay | Admitting: Family Medicine

## 2021-02-05 DIAGNOSIS — I482 Chronic atrial fibrillation, unspecified: Secondary | ICD-10-CM

## 2021-02-05 NOTE — Telephone Encounter (Signed)
Requested medications are due for refill today.  yes  Requested medications are on the active medications list.  yes  Last refill. 01/18/2020  Future visit scheduled.   no  Notes to clinic.  Prescription is expired.

## 2021-02-06 ENCOUNTER — Other Ambulatory Visit: Payer: Self-pay

## 2021-02-06 MED ORDER — RIVAROXABAN 20 MG PO TABS
ORAL_TABLET | Freq: Every day | ORAL | 0 refills | Status: DC
  Filled 2021-02-06: qty 30, 30d supply, fill #0

## 2021-02-20 ENCOUNTER — Encounter (HOSPITAL_COMMUNITY): Payer: Self-pay

## 2021-02-20 ENCOUNTER — Other Ambulatory Visit: Payer: Self-pay

## 2021-02-20 ENCOUNTER — Emergency Department (HOSPITAL_COMMUNITY): Payer: Medicaid Other

## 2021-02-20 ENCOUNTER — Emergency Department (HOSPITAL_COMMUNITY)
Admission: EM | Admit: 2021-02-20 | Discharge: 2021-02-20 | Disposition: A | Discharge status: Home / Self Care | Payer: Medicaid Other | Attending: Emergency Medicine | Admitting: Emergency Medicine

## 2021-02-20 DIAGNOSIS — I4819 Other persistent atrial fibrillation: Secondary | ICD-10-CM | POA: Diagnosis not present

## 2021-02-20 DIAGNOSIS — Z87891 Personal history of nicotine dependence: Secondary | ICD-10-CM | POA: Insufficient documentation

## 2021-02-20 DIAGNOSIS — Z79899 Other long term (current) drug therapy: Secondary | ICD-10-CM | POA: Diagnosis not present

## 2021-02-20 DIAGNOSIS — R0981 Nasal congestion: Secondary | ICD-10-CM | POA: Insufficient documentation

## 2021-02-20 DIAGNOSIS — I5032 Chronic diastolic (congestive) heart failure: Secondary | ICD-10-CM | POA: Diagnosis not present

## 2021-02-20 DIAGNOSIS — M542 Cervicalgia: Secondary | ICD-10-CM | POA: Insufficient documentation

## 2021-02-20 DIAGNOSIS — Z7901 Long term (current) use of anticoagulants: Secondary | ICD-10-CM | POA: Insufficient documentation

## 2021-02-20 DIAGNOSIS — I110 Hypertensive heart disease with heart failure: Secondary | ICD-10-CM | POA: Insufficient documentation

## 2021-02-20 DIAGNOSIS — R519 Headache, unspecified: Secondary | ICD-10-CM | POA: Insufficient documentation

## 2021-02-20 MED ORDER — SODIUM CHLORIDE 0.9 % IV BOLUS
500.0000 mL | Freq: Once | INTRAVENOUS | Status: AC
  Administered 2021-02-20: 13:00:00 500 mL via INTRAVENOUS

## 2021-02-20 MED ORDER — MAGNESIUM SULFATE IN D5W 1-5 GM/100ML-% IV SOLN
1.0000 g | Freq: Once | INTRAVENOUS | Status: AC
  Administered 2021-02-20: 13:00:00 1 g via INTRAVENOUS
  Filled 2021-02-20: qty 100

## 2021-02-20 MED ORDER — DIPHENHYDRAMINE HCL 50 MG/ML IJ SOLN
12.5000 mg | Freq: Once | INTRAMUSCULAR | Status: AC
  Administered 2021-02-20: 13:00:00 12.5 mg via INTRAVENOUS
  Filled 2021-02-20: qty 1

## 2021-02-20 MED ORDER — METOCLOPRAMIDE HCL 5 MG/ML IJ SOLN
10.0000 mg | Freq: Once | INTRAMUSCULAR | Status: AC
  Administered 2021-02-20: 13:00:00 10 mg via INTRAVENOUS
  Filled 2021-02-20: qty 2

## 2021-02-20 NOTE — Discharge Instructions (Signed)
You were seen in the emergency department for evaluation of a headache.  You had a CAT scan of your brain that did not show any acute findings.  Your symptoms improved with some medication.  Please continue your regular medications and follow-up with your doctor.  Return to the emergency department for any worsening or concerning symptoms.

## 2021-02-20 NOTE — ED Provider Notes (Addendum)
Emergency Medicine Provider Triage Evaluation Note  [name] , a 55 y.o. female  was evaluated in triage.  History includes A. fib on Xarelto, CHF, SVT, headaches, chronic pain, hypertension.  Pt complains of headache.  Patient reports history of headaches, this feels similar, not improved with home medications.  Generalized headache worse on the left side onset 2 days ago while she was working.  No sudden onset, she reports no abnormal features from her normal headaches.  Review of Systems  Positive: Headache Negative: Fever, chills, fall/injury, neck pain, vision changes, nausea/vomiting, numbness/tingling, weakness, dizziness or any additional concerns.  Physical Exam  BP (!) 140/97 (BP Location: Right Arm)   Pulse (!) 105   Temp 98.7 F (37.1 C) (Oral)   Resp 16   SpO2 97%  Gen:   Awake, no distress   Resp:  Normal effort  MSK:   Moves extremities without difficulty  Other:  Speech is clear and goal oriented, follows commands Major Cranial nerves without deficit, no facial droop Normal strength in upper extremities bilaterally including strong and equal grip strength Sensation normal to light and sharp touch Moves extremities without ataxia, coordination intact Normal finger to nose and rapid alternating movements Neg romberg, no pronator drift Normal gait Normal heel-shin and balance   Medical Decision Making  Medically screening exam initiated at 7:42 AM.  Appropriate orders placed.  CANDIACE WEST was informed that the remainder of the evaluation will be completed by another provider, this initial triage assessment does not replace that evaluation, and the importance of remaining in the ED until their evaluation is complete.   Note: Portions of this report may have been transcribed using voice recognition software. Every effort was made to ensure accuracy; however, inadvertent computerized transcription errors may still be present.    Deliah Boston, PA-C 02/20/21  0744    Deliah Boston, PA-C 02/20/21 0745    Hayden Rasmussen, MD 02/20/21 2039

## 2021-02-20 NOTE — ED Provider Notes (Signed)
[facility] EMERGENCY DEPARTMENT Provider Note   CSN: [id number] Arrival date & time: 02/20/21  7902     History No chief complaint on file.   [name] is a 55 y.o. female.  She is here with a complaint of headache that started 2 days ago gradual onset.  Its left occipital but also involves her temples.  Throbbing in nature.  She has had headaches like these before.  She tried some home medications without improvement.  She is afraid it is a stroke because she has had minor strokes in the past.  She denies that she had a headache when she had a stroke.  She is on blood thinners for A. fib.  No fevers or chills.  She has some chronic sinus issues.  No blurry vision or double vision.  No numbness or weakness  The history is provided by the patient.  Headache Pain location:  L temporal, R temporal and occipital Quality:  Dull Radiates to:  R neck and L neck Severity currently:  8/10 Severity at highest:  8/10 Onset quality:  Gradual Timing:  Intermittent Progression:  Unchanged Similar to prior headaches: yes   Relieved by:  Nothing Worsened by:  Nothing Ineffective treatments:  Acetaminophen Associated symptoms: neck pain and sinus pressure   Associated symptoms: no abdominal pain, no blurred vision, no cough, no eye pain, no facial pain, no fever, no focal weakness, no hearing loss, no nausea, no numbness, no sore throat, no visual change and no vomiting        Past Medical History:  Diagnosis Date  . Clotting disorder ([place])    on xarelto  . Essential hypertension   . Heart murmur    a. 10/2015 Echo: EF 60-65%, no rwma, mild AI/MR, sev dil LA/RA, PASP 61mmHg.  [name] Noncompliance   . NSVT (nonsustained ventricular tachycardia) ([place])    a. 10/2015 during admission for CVA/AF.  [name] Paroxysmal A-fib ([place])    a. CHA2DS2VASC = 4-->Xarelto;  b. 02/2016 Successful DCCV.  [name] Pneumonia 2012 x 2; 2015  . Stroke [facility])    a. 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  . Transient ischemic attack (TIA) 01/2013    Patient Active Problem List   Diagnosis Date Noted  . Longstanding persistent atrial fibrillation ([place]) 04/16/2020  . Secondary hypercoagulable state ([place]) 04/16/2020  . Edema leg 05/17/2019  . Severe obesity (BMI 35.0-39.9) with comorbidity ([place]) 04/04/2019  . Prediabetes 04/04/2019  . Seasonal allergic rhinitis due to pollen 12/14/2018  . Cough 12/14/2018  . Chronic back pain 08/23/2018  . Chronic diastolic CHF (congestive heart failure) ([place]) 06/23/2018  . OSA on CPAP 06/23/2018  . Acute ischemic right MCA stroke ([place]) 02/08/2018  . Headache 01/29/2017  . Refractive errors 04/15/2016  . Medical non-compliance 11/06/2015  . Hyperlipidemia LDL goal <70 11/06/2015  . 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 NSVT (nonsustained ventricular tachycardia) ([place]) 11/06/2015  . History of CVA (cerebrovascular accident)   . Claudication of both lower extremities ([place]) 03/22/2015  . Chronic atrial fibrillation ([place]) 04/12/2014  . Pap smear for cervical cancer screening 03/28/2014  . Cigarette smoker 06/15/2013  . Ventral hernia 06/15/2013  . Chronic anticoagulation 02/28/2013  . Hx-TIA (transient ischemic attack) 02/05/13 02/15/2013  . Essential hypertension 02/15/2013    Past Surgical History:  Procedure Laterality Date  . CARDIOVERSION N/A 02/18/2013   Procedure: CARDIOVERSION;  Surgeon: Sanda Klein, MD;  Location: [facility] ENDOSCOPY;  Service: Cardiovascular;  Laterality: N/A;  . CARDIOVERSION N/A 02/23/2013   Procedure: CARDIOVERSION;  Surgeon: Leonie Man, MD;  Location: [place];  Service: Cardiovascular;  Laterality: N/A;  BESIDE CV  . CARDIOVERSION N/A 03/12/2016   Procedure: CARDIOVERSION;  Surgeon: Larey Dresser, MD;  Location: [place];  Service: Cardiovascular;  Laterality: N/A;  . TEE WITHOUT CARDIOVERSION N/A 02/18/2013   Procedure:  TRANSESOPHAGEAL ECHOCARDIOGRAM (TEE);  Surgeon: Sanda Klein, MD;  Location: [facility] ENDOSCOPY;  Service: Cardiovascular;  Laterality: N/A;  . TUBAL LIGATION  1992     OB History   No obstetric history on file.     Family History  Problem Relation Age of Onset  . Cancer Mother   . Atrial fibrillation Mother   . Breast cancer Mother   . Hypertension Father   . Atrial fibrillation Father   . Peripheral vascular disease Father   . Hypertension Other   . Diabetes Other   . Heart attack Neg Hx   . Stroke Neg Hx   . Rectal cancer Neg Hx   . Colon cancer Neg Hx   . Esophageal cancer Neg Hx   . Stomach cancer Neg Hx     Social History   Tobacco Use  . Smoking status: Former Smoker    Years: 3.00    Types: Cigarettes    Quit date: 03/15/2013    Years since quitting: 7.9  . Smokeless tobacco: Never Used  Vaping Use  . Vaping Use: Never used  Substance Use Topics  . Alcohol use: Yes    Comment: drinks on the weekends,   2-3 beers  . Drug use: No    Home Medications Prior to Admission medications   Medication Sig Start Date End Date Taking? Authorizing Provider  Accu-Chek FastClix Lancets MISC USE AS INSTRUCTED TO CHECK BLOOD SUGAR ONCE DAILY. 03/07/20   [name], MD  ACCU-CHEK GUIDE test strip USE AS INSTRUCTED TO CHECK BLOOD SUGAR ONCE DAILY. 03/13/20   [name], MD  acetaminophen (TYLENOL) 500 MG tablet Take 1,000 mg by mouth every 6 (six) hours as needed for headache.     [provider]  albuterol (PROVENTIL HFA;VENTOLIN HFA) 108 (90 Base) MCG/ACT inhaler Inhale 2 puffs into the lungs every 6 (six) hours as needed for wheezing or shortness of breath. 11/17/18   [name], MD  atorvastatin (LIPITOR) 80 MG tablet Take 1 tablet (80 mg total) by mouth daily at 6 PM. Patient not taking: Reported on 08/20/2020 01/18/20   [name], MD  Blood Glucose Monitoring Suppl (ACCU-CHEK GUIDE ME) w/Device KIT 1 kit by Does not apply route daily. 05/02/19   [name], MD  cetirizine (ZYRTEC) 10 MG tablet Take 1 tablet (10 mg total) by mouth daily. Patient not taking: Reported on 08/16/2020 12/14/18   [name] Stain, MD  cyclobenzaprine (FLEXERIL) 5 MG tablet TAKE 1 TABLET UP TO THREE TIMES DAILY AS NEEDED. DO NOT DRIVE, OPERATE MACHINERY, OR WORK WHILE TAKING THIS MEDICATION. 09/25/20 09/25/21  Couture, Cortni S, PA-C  diclofenac Sodium (VOLTAREN) 1 % GEL APPLY 2 GRAMS TOPICALLY 4 (FOUR) TIMES DAILY. 09/25/20 09/25/21  Couture, Cortni S, PA-C  diltiazem (CARDIZEM CD) 300 MG 24 hr capsule TAKE 1 CAPSULE (300 MG TOTAL) BY MOUTH DAILY. 01/18/20 01/17/21  [name], MD  doxycycline (VIBRA-TABS) 100 MG tablet TAKE 1 CAPSULE (100 MG TOTAL) BY MOUTH 2 (TWO) TIMES DAILY. 10/03/20 10/03/21  Deno Etienne, DO  doxycycline (VIBRAMYCIN) 100 MG capsule Take 1 capsule (100  mg total) by mouth 2 (two) times daily. One po bid x 7 days 10/03/20   Deno Etienne, DO  fluticasone [facility]) 50 MCG/ACT nasal spray Place 2 sprays into both nostrils daily. 12/14/18   [name] Stain, MD  furosemide (LASIX) 20 MG tablet TAKE 1 TABLET (20 MG TOTAL) BY MOUTH AS NEEDED. 09/20/20 09/20/21  Erlene Quan, PA-C  gabapentin (NEURONTIN) 300 MG capsule TAKE 1 CAPSULE (300 MG TOTAL) BY MOUTH 2 (TWO) TIMES DAILY. 11/23/20 11/23/21  [name], MD  hydrochlorothiazide (MICROZIDE) 12.5 MG capsule TAKE 1 CAPSULE (12.5 MG TOTAL) BY MOUTH DAILY. 09/07/19 08/16/20  Erlene Quan, PA-C  metFORMIN (GLUCOPHAGE) 500 MG tablet Take 1 tablet (500 mg total) by mouth 2 (two) times daily with a meal. Patient not taking: Reported on 08/20/2020 01/18/20   [name], MD  metoprolol tartrate (LOPRESSOR) 25 MG tablet TAKE 1 TABLET (25 MG TOTAL) BY MOUTH 2 (TWO) TIMES DAILY. 04/10/20 04/10/21  Barrett, Evelene Croon, PA-C  metroNIDAZOLE (METROGEL) 0.75 % vaginal gel PLACE 1 APPLICATORFUL VAGINALLY AT BEDTIME. Patient not taking: Reported on 08/16/2020 06/25/20 06/25/21  [name], MD  Misc. Devices MISC Blood pressure  monitor.  Diagnosis Hypertension 02/11/19   [name], MD  rivaroxaban (XARELTO) 20 MG TABS tablet TAKE 1 TABLET (20 MG TOTAL) BY MOUTH DAILY WITH SUPPER. 02/06/21 02/06/22  [name], MD    Allergies    Penicillins  Review of Systems   Review of Systems  Constitutional: Negative for fever.  HENT: Positive for sinus pressure. Negative for hearing loss and sore throat.   Eyes: Negative for blurred vision and pain.  Respiratory: Negative for cough and shortness of breath.   Cardiovascular: Negative for chest pain.  Gastrointestinal: Negative for abdominal pain, nausea and vomiting.  Genitourinary: Negative for dysuria.  Musculoskeletal: Positive for neck pain.  Skin: Negative for rash.  Neurological: Positive for headaches. Negative for focal weakness and numbness.    Physical Exam Updated Vital Signs BP (!) 125/105 (BP Location: Right Arm)   Pulse (!) 121   Temp 98.1 F (36.7 C) (Oral)   Resp 16   SpO2 97%   Physical Exam Vitals and nursing note reviewed.  Constitutional:      General: She is not in acute distress.    Appearance: Normal appearance. She is well-developed.  HENT:     Head: Normocephalic and atraumatic.  Eyes:     Extraocular Movements: Extraocular movements intact.     Conjunctiva/sclera: Conjunctivae normal.     Pupils: Pupils are equal, round, and reactive to light.  Cardiovascular:     Rate and Rhythm: Normal rate and regular rhythm.     Heart sounds: No murmur heard.   Pulmonary:     Effort: Pulmonary effort is normal. No respiratory distress.     Breath sounds: Normal breath sounds.  Abdominal:     Palpations: Abdomen is soft.     Tenderness: There is no abdominal tenderness.  Musculoskeletal:        General: No deformity or signs of injury. Normal range of motion.     Cervical back: Neck supple. No tenderness.  Skin:    General: Skin is warm and dry.     Capillary Refill: Capillary refill takes less than 2 seconds.  Neurological:      General: No focal deficit present.     Mental Status: She is alert and oriented to person, place, and time.     Cranial Nerves: No cranial nerve deficit.  Sensory: No sensory deficit.     Motor: No weakness.     ED Results / Procedures / Treatments   Labs (all labs ordered are listed, but only abnormal results are displayed) Labs Reviewed - No data to display  EKG None  Radiology CT Head Wo Contrast  Result Date: 02/20/2021 CLINICAL DATA:  Headache with head pressure all over. Intracranial hemorrhage suspected. EXAM: CT HEAD WITHOUT CONTRAST TECHNIQUE: Contiguous axial images were obtained from the base of the skull through the vertex without intravenous contrast. COMPARISON:  CT head 12/23/2018. FINDINGS: Brain: There is no evidence of acute intracranial hemorrhage, mass lesion, brain edema or extra-axial fluid collection. Stable mild atrophy with prominence of the ventricles and subarachnoid spaces. There is chronic atrophy in the right temporoparietal lobes consistent with an old infarct. There is no CT evidence of acute cortical infarction. Vascular: Intracranial vascular calcifications. No hyperdense vessel identified. Skull: No focal lesion or acute abnormality. Generalized calvarial hyperostosis. Sinuses/Orbits: The visualized paranasal sinuses and mastoid air cells are clear. No orbital abnormalities are seen. Other: None. IMPRESSION: 1. Stable right temporoparietal encephalomalacia consistent with an old infarct. 2. No evidence of acute intracranial hemorrhage or acute infarction. Electronically Signed   By: Richardean Sale M.D.   On: 02/20/2021 12:41    Procedures Procedures   Medications Ordered in ED Medications  magnesium sulfate IVPB 1 g 100 mL (0 g Intravenous Stopped 02/20/21 1339)  metoCLOPramide (REGLAN) injection 10 mg (10 mg Intravenous Given 02/20/21 1240)  diphenhydrAMINE (BENADRYL) injection 12.5 mg (12.5 mg Intravenous Given 02/20/21 1237)  sodium chloride 0.9 %  bolus 500 mL (0 mLs Intravenous Stopped 02/20/21 1315)    ED Course  I have reviewed the triage vital signs and the nursing notes.  Pertinent labs & imaging results that were available during my care of the patient were reviewed by me and considered in my medical decision making (see chart for details).  Clinical Course as of 02/20/21 2037  Wed Feb 20, 2021  1351 Reassessed after Reglan Benadryl and magnesium.  She says her headache is much improved.  CT did not show any acute findings.  She is comfortable plan for discharge. [MB]    Clinical Course User Index [MB] Hayden Rasmussen, MD   MDM Rules/Calculators/A&P                         Few days of a throbbing headache in the setting of taking anticoagulation.  She is concerned that she may have a stroke.  I think this is unlikely but concern for possible bleed although she has had headaches like this before.  Given migraine cocktail with improvement in her symptoms.  Head CT does not show any significant findings.  She is feeling better and she is comfortable plan for discharge.  Recommended close follow-up with her PCP.  Return instructions discussed  Final Clinical Impression(s) / ED Diagnoses Final diagnoses:  Generalized headache    Rx / DC Orders ED Discharge Orders    None       Hayden Rasmussen, MD 02/20/21 2039

## 2021-02-20 NOTE — ED Triage Notes (Signed)
Patient complains of generalized headache x 2 days, hx of same. Patient denies trauma. Alert and oriented. No associated symptoms

## 2021-02-20 NOTE — ED Notes (Signed)
Pt transported to CT, will attempt IV access again when she returns

## 2021-02-21 ENCOUNTER — Telehealth: Payer: Self-pay

## 2021-02-21 NOTE — Telephone Encounter (Signed)
Transition Care Management Unsuccessful Follow-up Telephone Call  Date of discharge and from where:  02/20/2021 from [facility]  Attempts:  1st Attempt  Reason for unsuccessful TCM follow-up call:  Left voice message

## 2021-02-22 NOTE — Telephone Encounter (Signed)
Transition Care Management Unsuccessful Follow-up Telephone Call  Date of discharge and from where:  02/20/2021 from [facility]  Attempts:  2nd Attempt  Reason for unsuccessful TCM follow-up call:  Left voice message

## 2021-02-25 NOTE — Telephone Encounter (Signed)
Transition Care Management Unsuccessful Follow-up Telephone Call  Date of discharge and from where:  02/20/2021 from [facility]  Attempts:  3rd Attempt  Reason for unsuccessful TCM follow-up call:  Unable to reach patient

## 2021-03-13 ENCOUNTER — Other Ambulatory Visit: Payer: Self-pay | Admitting: Family Medicine

## 2021-03-13 DIAGNOSIS — I482 Chronic atrial fibrillation, unspecified: Secondary | ICD-10-CM

## 2021-03-14 NOTE — Telephone Encounter (Signed)
  Notes to clinic: Verify direction for refill  Requested Prescriptions  Pending Prescriptions Disp Refills   rivaroxaban (XARELTO) 20 MG TABS tablet 30 tablet 0    Sig: Take by mouth daily with supper.      Hematology: Anticoagulants - rivaroxaban Failed - 03/13/2021  9:28 PM      Failed - ALT in normal range and within 180 days    ALT  Date Value Ref Range Status  10/21/2019 12 0 - 32 IU/L Final          Failed - AST in normal range and within 180 days    AST  Date Value Ref Range Status  10/21/2019 17 0 - 40 IU/L Final          Passed - Cr in normal range and within 360 days    Creat  Date Value Ref Range Status  03/07/2016 0.88 0.50 - 1.05 mg/dL Final    Comment:      For patients > or = 57 years of age: The upper reference limit for Creatinine is approximately 13% higher for people identified as African-American.      Creatinine, Ser  Date Value Ref Range Status  10/03/2020 0.88 0.44 - 1.00 mg/dL Final          Passed - HCT in normal range and within 360 days    HCT  Date Value Ref Range Status  10/03/2020 44.1 36.0 - 46.0 % Final          Passed - HGB in normal range and within 360 days    Hemoglobin  Date Value Ref Range Status  10/03/2020 13.7 12.0 - 15.0 g/dL Final          Passed - PLT in normal range and within 360 days    Platelets  Date Value Ref Range Status  10/03/2020 220 150 - 400 K/uL Final          Passed - Valid encounter within last 12 months    Recent Outpatient Visits           8 months ago Annual physical exam   [place], [place], [place]   11 months ago Urinary tract infection without hematuria, site unspecified   [place], [place], [place]   1 year ago Type 2 diabetes mellitus without complication, without long-term current use of insulin ([place])   [place], [place], MD   1 year ago Chronic atrial fibrillation  [facility])   [place], Charlane Ferretti, MD   1 year ago Type 2 diabetes mellitus without complication, without long-term current use of insulin ([place])    [facility] [name], MD

## 2021-03-15 ENCOUNTER — Other Ambulatory Visit: Payer: Self-pay

## 2021-03-19 ENCOUNTER — Other Ambulatory Visit: Payer: Self-pay

## 2021-03-20 ENCOUNTER — Telehealth: Payer: Self-pay | Admitting: Cardiovascular Disease

## 2021-03-20 ENCOUNTER — Other Ambulatory Visit: Payer: Self-pay

## 2021-03-20 DIAGNOSIS — I482 Chronic atrial fibrillation, unspecified: Secondary | ICD-10-CM

## 2021-03-20 MED ORDER — RIVAROXABAN 20 MG PO TABS
ORAL_TABLET | Freq: Every day | ORAL | 0 refills | Status: DC
  Filled 2021-03-20: qty 90, 90d supply, fill #0

## 2021-03-20 NOTE — Telephone Encounter (Signed)
Prescription refill request for Xarelto received.  Indication:atrial fib Last office visit:10/21 Weight:100.7 kg Age:55 Scr:0.8 CrCl: 124.83 ml/min  Prescription refilled

## 2021-03-20 NOTE — Telephone Encounter (Signed)
*  STAT* If patient is at the pharmacy, call can be transferred to refill team.   1. Which medications need to be refilled? (please list name of each medication and dose if known) rivaroxaban (XARELTO) 20 MG TABS tablet  2. Which pharmacy/location (including street and city if local pharmacy) is medication to be sent to?  [facility]  3. Do they need a 30 day or 90 day supply? [place]

## 2021-03-27 ENCOUNTER — Telehealth: Payer: Self-pay | Admitting: Family Medicine

## 2021-03-27 DIAGNOSIS — G43809 Other migraine, not intractable, without status migrainosus: Secondary | ICD-10-CM

## 2021-03-27 NOTE — Telephone Encounter (Signed)
Pt is requesting referral to a neurologist for her headaches. Patient wen to ED for headaches on 02/20/21

## 2021-03-27 NOTE — Telephone Encounter (Signed)
Copied from [place] [id number]. Topic: General - Inquiry >> Mar 26, 2021  4:38 PM Greggory Keen D wrote: Reason for CRM: Pt would like a referral to her neurologist for her headaches.  [facility]    CB#  [phone]

## 2021-03-27 NOTE — Telephone Encounter (Signed)
Pt has upcoming appointment 

## 2021-03-28 ENCOUNTER — Inpatient Hospital Stay (HOSPITAL_COMMUNITY): Payer: Medicaid Other

## 2021-03-28 ENCOUNTER — Encounter (HOSPITAL_COMMUNITY): Payer: Self-pay | Admitting: *Deleted

## 2021-03-28 ENCOUNTER — Emergency Department (HOSPITAL_COMMUNITY): Payer: Medicaid Other

## 2021-03-28 ENCOUNTER — Other Ambulatory Visit: Payer: Self-pay

## 2021-03-28 ENCOUNTER — Inpatient Hospital Stay (HOSPITAL_COMMUNITY)
Admission: EM | Admit: 2021-03-28 | Discharge: 2021-04-04 | DRG: 286 | Disposition: A | Discharge status: Home / Self Care | Payer: Medicaid Other | Attending: Internal Medicine | Admitting: Internal Medicine

## 2021-03-28 DIAGNOSIS — Z8673 Personal history of transient ischemic attack (TIA), and cerebral infarction without residual deficits: Secondary | ICD-10-CM

## 2021-03-28 DIAGNOSIS — E119 Type 2 diabetes mellitus without complications: Secondary | ICD-10-CM | POA: Diagnosis present

## 2021-03-28 DIAGNOSIS — I5023 Acute on chronic systolic (congestive) heart failure: Secondary | ICD-10-CM | POA: Diagnosis not present

## 2021-03-28 DIAGNOSIS — I5041 Acute combined systolic (congestive) and diastolic (congestive) heart failure: Secondary | ICD-10-CM | POA: Diagnosis not present

## 2021-03-28 DIAGNOSIS — I428 Other cardiomyopathies: Secondary | ICD-10-CM | POA: Diagnosis not present

## 2021-03-28 DIAGNOSIS — G4733 Obstructive sleep apnea (adult) (pediatric): Secondary | ICD-10-CM | POA: Diagnosis present

## 2021-03-28 DIAGNOSIS — I272 Pulmonary hypertension, unspecified: Secondary | ICD-10-CM | POA: Diagnosis present

## 2021-03-28 DIAGNOSIS — I959 Hypotension, unspecified: Secondary | ICD-10-CM | POA: Diagnosis present

## 2021-03-28 DIAGNOSIS — J90 Pleural effusion, not elsewhere classified: Secondary | ICD-10-CM | POA: Diagnosis not present

## 2021-03-28 DIAGNOSIS — Z803 Family history of malignant neoplasm of breast: Secondary | ICD-10-CM | POA: Diagnosis not present

## 2021-03-28 DIAGNOSIS — Z8249 Family history of ischemic heart disease and other diseases of the circulatory system: Secondary | ICD-10-CM | POA: Diagnosis not present

## 2021-03-28 DIAGNOSIS — Z7984 Long term (current) use of oral hypoglycemic drugs: Secondary | ICD-10-CM

## 2021-03-28 DIAGNOSIS — Z87891 Personal history of nicotine dependence: Secondary | ICD-10-CM | POA: Diagnosis not present

## 2021-03-28 DIAGNOSIS — Z9119 Patient's noncompliance with other medical treatment and regimen: Secondary | ICD-10-CM | POA: Diagnosis not present

## 2021-03-28 DIAGNOSIS — E785 Hyperlipidemia, unspecified: Secondary | ICD-10-CM | POA: Diagnosis not present

## 2021-03-28 DIAGNOSIS — I4821 Permanent atrial fibrillation: Secondary | ICD-10-CM | POA: Diagnosis not present

## 2021-03-28 DIAGNOSIS — J811 Chronic pulmonary edema: Secondary | ICD-10-CM | POA: Diagnosis not present

## 2021-03-28 DIAGNOSIS — Z79899 Other long term (current) drug therapy: Secondary | ICD-10-CM

## 2021-03-28 DIAGNOSIS — R1310 Dysphagia, unspecified: Secondary | ICD-10-CM | POA: Diagnosis not present

## 2021-03-28 DIAGNOSIS — R778 Other specified abnormalities of plasma proteins: Secondary | ICD-10-CM | POA: Diagnosis not present

## 2021-03-28 DIAGNOSIS — I4811 Longstanding persistent atrial fibrillation: Secondary | ICD-10-CM | POA: Diagnosis not present

## 2021-03-28 DIAGNOSIS — I10 Essential (primary) hypertension: Secondary | ICD-10-CM | POA: Diagnosis not present

## 2021-03-28 DIAGNOSIS — I4891 Unspecified atrial fibrillation: Secondary | ICD-10-CM | POA: Diagnosis not present

## 2021-03-28 DIAGNOSIS — Z20822 Contact with and (suspected) exposure to covid-19: Secondary | ICD-10-CM | POA: Diagnosis not present

## 2021-03-28 DIAGNOSIS — I5031 Acute diastolic (congestive) heart failure: Secondary | ICD-10-CM

## 2021-03-28 DIAGNOSIS — I110 Hypertensive heart disease with heart failure: Secondary | ICD-10-CM | POA: Diagnosis not present

## 2021-03-28 DIAGNOSIS — I509 Heart failure, unspecified: Secondary | ICD-10-CM | POA: Diagnosis not present

## 2021-03-28 DIAGNOSIS — I5043 Acute on chronic combined systolic (congestive) and diastolic (congestive) heart failure: Secondary | ICD-10-CM | POA: Diagnosis present

## 2021-03-28 DIAGNOSIS — R911 Solitary pulmonary nodule: Secondary | ICD-10-CM | POA: Diagnosis not present

## 2021-03-28 DIAGNOSIS — I2510 Atherosclerotic heart disease of native coronary artery without angina pectoris: Secondary | ICD-10-CM | POA: Diagnosis present

## 2021-03-28 DIAGNOSIS — I472 Ventricular tachycardia: Secondary | ICD-10-CM | POA: Diagnosis not present

## 2021-03-28 DIAGNOSIS — Z833 Family history of diabetes mellitus: Secondary | ICD-10-CM

## 2021-03-28 DIAGNOSIS — N179 Acute kidney failure, unspecified: Secondary | ICD-10-CM | POA: Diagnosis present

## 2021-03-28 DIAGNOSIS — R0602 Shortness of breath: Secondary | ICD-10-CM | POA: Diagnosis not present

## 2021-03-28 DIAGNOSIS — I5042 Chronic combined systolic (congestive) and diastolic (congestive) heart failure: Secondary | ICD-10-CM | POA: Diagnosis present

## 2021-03-28 DIAGNOSIS — I5021 Acute systolic (congestive) heart failure: Secondary | ICD-10-CM | POA: Diagnosis not present

## 2021-03-28 DIAGNOSIS — I5033 Acute on chronic diastolic (congestive) heart failure: Secondary | ICD-10-CM | POA: Diagnosis not present

## 2021-03-28 DIAGNOSIS — I2699 Other pulmonary embolism without acute cor pulmonale: Secondary | ICD-10-CM | POA: Diagnosis not present

## 2021-03-28 DIAGNOSIS — I517 Cardiomegaly: Secondary | ICD-10-CM | POA: Diagnosis not present

## 2021-03-28 DIAGNOSIS — Z7901 Long term (current) use of anticoagulants: Secondary | ICD-10-CM

## 2021-03-28 HISTORY — DX: Other persistent atrial fibrillation: I48.19

## 2021-03-28 LAB — CBC WITH DIFFERENTIAL/PLATELET
Abs Immature Granulocytes: 0.02 10*3/uL (ref 0.00–0.07)
Basophils Absolute: 0.1 10*3/uL (ref 0.0–0.1)
Basophils Relative: 1 %
Eosinophils Absolute: 0.1 10*3/uL (ref 0.0–0.5)
Eosinophils Relative: 2 %
HCT: 40.5 % (ref 36.0–46.0)
Hemoglobin: 12.2 g/dL (ref 12.0–15.0)
Immature Granulocytes: 0 %
Lymphocytes Relative: 22 %
Lymphs Abs: 1.8 10*3/uL (ref 0.7–4.0)
MCH: 25.4 pg — ABNORMAL LOW (ref 26.0–34.0)
MCHC: 30.1 g/dL (ref 30.0–36.0)
MCV: 84.2 fL (ref 80.0–100.0)
Monocytes Absolute: 0.6 10*3/uL (ref 0.1–1.0)
Monocytes Relative: 7 %
Neutro Abs: 5.3 10*3/uL (ref 1.7–7.7)
Neutrophils Relative %: 68 %
Platelets: 213 10*3/uL (ref 150–400)
RBC: 4.81 MIL/uL (ref 3.87–5.11)
RDW: 15.6 % — ABNORMAL HIGH (ref 11.5–15.5)
WBC: 7.8 10*3/uL (ref 4.0–10.5)
nRBC: 0 % (ref 0.0–0.2)

## 2021-03-28 LAB — MRSA NEXT GEN BY PCR, NASAL: MRSA by PCR Next Gen: NOT DETECTED

## 2021-03-28 LAB — BASIC METABOLIC PANEL
Anion gap: 9 (ref 5–15)
BUN: 12 mg/dL (ref 6–20)
CO2: 21 mmol/L — ABNORMAL LOW (ref 22–32)
Calcium: 8.8 mg/dL — ABNORMAL LOW (ref 8.9–10.3)
Chloride: 108 mmol/L (ref 98–111)
Creatinine, Ser: 0.92 mg/dL (ref 0.44–1.00)
GFR, Estimated: >60 mL/min (ref >60)
Glucose, Bld: 146 mg/dL — ABNORMAL HIGH (ref 70–99)
Potassium: 3.5 mmol/L (ref 3.5–5.1)
Sodium: 138 mmol/L (ref 135–145)

## 2021-03-28 LAB — MAGNESIUM: Magnesium: 2.1 mg/dL (ref 1.7–2.4)

## 2021-03-28 LAB — ECHOCARDIOGRAM COMPLETE
Area-P 1/2: 5.75 cm2
Calc EF: 16.7 %
Height: 64 in
MV M vel: 3.1 m/s
MV Peak grad: 38.4 mmHg
P 1/2 time: 667 msec
Radius: 0.5 cm
S' Lateral: 3.3 cm
Single Plane A2C EF: 19.7 %
Single Plane A4C EF: 13.9 %
Weight: 2945.35 oz

## 2021-03-28 LAB — TSH: TSH: 2.122 u[IU]/mL (ref 0.350–4.500)

## 2021-03-28 LAB — HEMOGLOBIN A1C
Hgb A1c MFr Bld: 6.7 % — ABNORMAL HIGH (ref 4.8–5.6)
Mean Plasma Glucose: 145.59 mg/dL

## 2021-03-28 LAB — BRAIN NATRIURETIC PEPTIDE: B Natriuretic Peptide: 325.5 pg/mL — ABNORMAL HIGH (ref 0.0–100.0)

## 2021-03-28 LAB — TROPONIN I (HIGH SENSITIVITY)
Troponin I (High Sensitivity): 103 ng/L (ref <18)
Troponin I (High Sensitivity): 107 ng/L (ref <18)

## 2021-03-28 LAB — RESP PANEL BY RT-PCR (FLU A&B, COVID) ARPGX2
Influenza A by PCR: NEGATIVE
Influenza B by PCR: NEGATIVE
SARS Coronavirus 2 by RT PCR: NEGATIVE

## 2021-03-28 LAB — HIV ANTIBODY (ROUTINE TESTING W REFLEX): HIV Screen 4th Generation wRfx: NONREACTIVE

## 2021-03-28 MED ORDER — ACETAMINOPHEN 325 MG PO TABS
650.0000 mg | ORAL_TABLET | ORAL | Status: DC | PRN
  Administered 2021-03-28: 650 mg via ORAL
  Filled 2021-03-28: qty 2

## 2021-03-28 MED ORDER — ATORVASTATIN CALCIUM 80 MG PO TABS
80.0000 mg | ORAL_TABLET | Freq: Every day | ORAL | Status: DC
  Administered 2021-03-28 – 2021-04-03 (×7): 80 mg via ORAL
  Filled 2021-03-28 (×7): qty 1

## 2021-03-28 MED ORDER — ONDANSETRON HCL 4 MG/2ML IJ SOLN: 4.0000 mg | Freq: Four times a day (QID) | INTRAMUSCULAR | Status: DC | PRN

## 2021-03-28 MED ORDER — SODIUM CHLORIDE 0.9% FLUSH: 3.0000 mL | INTRAVENOUS | Status: DC | PRN

## 2021-03-28 MED ORDER — PERFLUTREN LIPID MICROSPHERE
1.0000 mL | INTRAVENOUS | Status: AC | PRN
  Administered 2021-03-28: 2 mL via INTRAVENOUS
  Filled 2021-03-28: qty 10

## 2021-03-28 MED ORDER — METOPROLOL TARTRATE 25 MG PO TABS: 25.0000 mg | ORAL_TABLET | Freq: Two times a day (BID) | ORAL | Status: DC

## 2021-03-28 MED ORDER — DILTIAZEM HCL-DEXTROSE 125-5 MG/125ML-% IV SOLN (PREMIX)
5.0000 mg/h | INTRAVENOUS | Status: DC
  Administered 2021-03-28: 5 mg/h via INTRAVENOUS
  Filled 2021-03-28: qty 125

## 2021-03-28 MED ORDER — DILTIAZEM HCL ER COATED BEADS 180 MG PO CP24: 300.0000 mg | ORAL_CAPSULE | Freq: Every day | ORAL | Status: DC

## 2021-03-28 MED ORDER — IOHEXOL 350 MG/ML SOLN
80.0000 mL | Freq: Once | INTRAVENOUS | Status: AC | PRN
  Administered 2021-03-28: 80 mL via INTRAVENOUS

## 2021-03-28 MED ORDER — GABAPENTIN 300 MG PO CAPS
300.0000 mg | ORAL_CAPSULE | Freq: Two times a day (BID) | ORAL | Status: DC
  Administered 2021-03-28 – 2021-04-04 (×15): 300 mg via ORAL
  Filled 2021-03-28 (×15): qty 1

## 2021-03-28 MED ORDER — METOPROLOL TARTRATE 25 MG PO TABS
25.0000 mg | ORAL_TABLET | Freq: Two times a day (BID) | ORAL | Status: DC
  Administered 2021-03-29: 25 mg via ORAL
  Filled 2021-03-28: qty 1

## 2021-03-28 MED ORDER — SODIUM CHLORIDE 0.9 % IV SOLN: 250.0000 mL | INTRAVENOUS | Status: DC | PRN

## 2021-03-28 MED ORDER — POTASSIUM CHLORIDE CRYS ER 20 MEQ PO TBCR
60.0000 meq | EXTENDED_RELEASE_TABLET | ORAL | Status: AC
  Administered 2021-03-28: 60 meq via ORAL
  Filled 2021-03-28: qty 3

## 2021-03-28 MED ORDER — RIVAROXABAN 20 MG PO TABS
20.0000 mg | ORAL_TABLET | Freq: Every day | ORAL | Status: DC
  Administered 2021-03-28 – 2021-03-31 (×4): 20 mg via ORAL
  Filled 2021-03-28 (×4): qty 1

## 2021-03-28 MED ORDER — DILTIAZEM LOAD VIA INFUSION
20.0000 mg | Freq: Once | INTRAVENOUS | Status: AC
  Administered 2021-03-28: 20 mg via INTRAVENOUS
  Filled 2021-03-28: qty 20

## 2021-03-28 MED ORDER — SODIUM CHLORIDE 0.9% FLUSH
3.0000 mL | Freq: Two times a day (BID) | INTRAVENOUS | Status: DC
  Administered 2021-03-28 – 2021-04-01 (×9): 3 mL via INTRAVENOUS

## 2021-03-28 MED ORDER — METOPROLOL TARTRATE 5 MG/5ML IV SOLN
5.0000 mg | Freq: Once | INTRAVENOUS | Status: AC
  Administered 2021-03-28: 5 mg via INTRAVENOUS
  Filled 2021-03-28: qty 5

## 2021-03-28 MED ORDER — CYCLOBENZAPRINE HCL 10 MG PO TABS: 5.0000 mg | ORAL_TABLET | Freq: Three times a day (TID) | ORAL | Status: DC | PRN

## 2021-03-28 MED ORDER — FUROSEMIDE 10 MG/ML IJ SOLN
20.0000 mg | Freq: Once | INTRAMUSCULAR | Status: AC
  Administered 2021-03-28: 20 mg via INTRAVENOUS
  Filled 2021-03-28: qty 2

## 2021-03-28 NOTE — ED Provider Notes (Signed)
Emergency Medicine Provider Triage Evaluation Note  [name] , a 55 y.o. female  was evaluated in triage.  Pt complains of SOB for the past few days, worse this morning.  Does have + nighttime orthopnea and some swelling in lower legs.  Denies fever, cough, sick contacts.  Sees cardiology, Dr. Sallyanne [place].  Review of Systems  Positive: SOB Negative: Chest pain, fever  Physical Exam  BP (!) 143/123 (BP Location: Left Arm)   Pulse (!) 131   Temp 98.1 F (36.7 C) (Oral)   Resp (!) 22   SpO2 98%  Gen:   Awake, no distress   Resp:  Normal effort  MSK:   Moves extremities without difficulty  Other:  Trace edema at ankles bilaterally  Medical Decision Making  Medically screening exam initiated at 5:25 AM.  Appropriate orders placed.  [name] was informed that the remainder of the evaluation will be completed by another provider, this initial triage assessment does not replace that evaluation, and the importance of remaining in the ED until their evaluation is complete.  EKG, labs, CXR.   Larene Pickett, PA-C 03/28/21 9937    Davonna Belling, MD 03/28/21 [phone]

## 2021-03-28 NOTE — ED Notes (Signed)
Report given to April C, RN of 2C13

## 2021-03-28 NOTE — Progress Notes (Signed)
RT note. Patient cpap all set up for the night on auto 15/5 with 1L bled in line. Patient instructed how to place cpap on and turn on. RN aware. RT will continue to monitor.

## 2021-03-28 NOTE — ED Triage Notes (Signed)
C/o sob onset several days ago , states it is worse when she lays down, states the sob comes and goes. Able to speak in complete sentences. Denies chest pain

## 2021-03-28 NOTE — H&P (Signed)
History and Physical    [name]:[id number] DOB: March 13, 1966 DOA: 03/28/2021  Referring MD/NP/PA: Isla Pence, MD PCP: [name], MD  Consultants: Sanda Klein, MD-cardiology Patient coming from: Home  Chief Complaint: Shortness of breath  I have personally briefly reviewed patient's old medical records in [name]   HPI: [name] is a 55 y.o. female with medical history significant of atrial fibrillation on anticoagulation, CVA/TIA, and hypertension presents with complaints of shortness of breath acutely worsened over the last 2 days.  Patient reports that earlier this week she had been needing to sit and rest in order to catch her breath while getting up and moving around.  She chronically has palpitations related to her atrial fibrillation.  At night patient had been having increased palpitations with laying on her left side, and complaints of or tachypnea with laying flat on her back.  She had been unable to sleep at night and had been sitting up.  Associated symptoms include weight gain of approximately 10 pounds and ankle swelling.  Denies having any fever, chest pain, nausea, vomiting, diarrhea, abdominal pain, or dysuria symptoms.  Patient has been compliant with all of her medications.  She had taken her medications of Cardizem and metoprolol prior to leaving the house this morning around 3 AM.  Patient admits that she had not taken her f as needed furosemide due to not knowing when to appropriately take the medicine.  Complains that she does have difficulty swallowing the Cardizem due to the pill feeling as though it gets stuck in her throat.  ED Course: Upon admission into the emergency department patient was seen to be afebrile, pulse 105-130s in atrial fibrillation, blood pressure is 112/87-145/103, and O2 saturations maintained on room air.  Labs significant for BNP 325.5 and high-sensitivity troponin 103-> 107.  Chest x-ray noted cardiomegaly with  baseline pulmonary vascular congestion without overt edema.  CTA of the chest was done due to the concern for possible pulmonary embolus.  CTA revealed no evidence of a pulmonary embolism, cardiomegaly with evidence of CHF with interstitial edema and trace bilateral pleural effusions, groundglass nodules measuring up to 7 mm in the right upper lobe, aortic atherosclerosis with left main and three-vessel coronary artery disease.  Patient has been given metoprolol 5 mg IV per minute heart rate, Lasix 20 mg IV, and started on a Cardizem drip.  TRH called to admit  Review of Systems  Constitutional:  Negative for fever.  HENT:  Negative for ear discharge.   Eyes:  Negative for photophobia and pain.  Respiratory:  Positive for shortness of breath.   Cardiovascular:  Positive for palpitations, orthopnea and leg swelling. Negative for chest pain.  Gastrointestinal:  Negative for diarrhea, nausea and vomiting.  Genitourinary:  Negative for dysuria and hematuria.  Musculoskeletal:  Negative for falls.  Neurological:  Negative for focal weakness and loss of consciousness.  Psychiatric/Behavioral:  Negative for substance abuse. The patient has insomnia.    Past Medical History:  Diagnosis Date   Clotting disorder ([place])    on xarelto   Essential hypertension    Heart murmur    a. 10/2015 Echo: EF 60-65%, no rwma, mild AI/MR, sev dil LA/RA, PASP 27mHg.   Noncompliance    [place] ([place]onsustained [place]cardia) [place]    a. 10/2015 during admission for CVA/AF.   Paroxysmal A-fib ([place]    a. CHA2DS2VASC = 4-->Xarelto;  b. 02/2016 Successful DCCV.   Pneumonia 2012 x 2; 2015   Stroke (  [place])    a. 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   Transient ischemic attack (TIA) 01/2013    Past Surgical History:  Procedure Laterality Date   CARDIOVERSION N/A 02/18/2013   Procedure: CARDIOVERSION;  Surgeon: Sanda Klein, MD;   Location: [facility];  Service: Cardiovascular;  Laterality: N/A;   CARDIOVERSION N/A 02/23/2013   Procedure: CARDIOVERSION;  Surgeon: Leonie Man, MD;  Location: [facility];  Service: Cardiovascular;  Laterality: N/A;  [place] CV   CARDIOVERSION N/A 03/12/2016   Procedure: CARDIOVERSION;  Surgeon: Larey Dresser, MD;  Location: [facility];  Service: Cardiovascular;  Laterality: N/A;   TEE WITHOUT CARDIOVERSION N/A 02/18/2013   Procedure: TRANSESOPHAGEAL ECHOCARDIOGRAM (TEE);  Surgeon: Sanda Klein, MD;  Location[facility]COPY;  Service: Cardiovascular;  Laterality: N/A;   [place]     reports that she quit smoking about 8 years ago. Her smoking use included cigarettes. She has never used smokeless tobacco. She reports current alcohol use. She reports that she does not use drugs.  Allergies  Allergen Reactions   Penicillins Hives and Other (See Comments)    Unknown  Has patient had a PCN reaction causing immediate rash, facial/tongue/throat swelling, SOB or lightheadedness with hypotension: No Has patient had a PCN reaction causing severe rash involving mucus membranes or skin necrosis: No Has patient had a PCN reaction that required hospitalization No Has patient had a PCN reaction occurring within the last 10 years: No If all of the above answers are "NO", then may proceed with Cephalosporin use.    Family History  Problem Relation Age of Onset   Cancer Mother    Atrial fibrillation Mother    Breast cancer Mother    Hypertension Father    Atrial fibrillation Father    Peripheral vascular disease Father    Hypertension Other    Diabetes Other    Heart attack Neg Hx    Stroke Neg Hx    Rectal cancer Neg Hx    Colon cancer Neg Hx    Esophageal cancer Neg Hx    Stomach cancer Neg Hx     Prior to Admission medications   Medication Sig Start Date End Date Taking? Authorizing Provider  Accu-Chek FastClix Lancets MISC USE AS INSTRUCTED TO CHECK BLOOD SUGAR ONCE  DAILY. 03/07/20   [name], MD  ACCU-CHEK GUIDE test strip USE AS INSTRUCTED TO CHECK BLOOD SUGAR ONCE DAILY. 03/13/20   [name], MD  acetaminophen (TYLENOL) 500 MG tablet Take 1,000 mg by mouth every 6 (six) hours as needed for headache.     [provider]  albuterol (PROVENTIL HFA;VENTOLIN HFA) 108 (90 Base) MCG/ACT inhaler Inhale 2 puffs into the lungs every 6 (six) hours as needed for wheezing or shortness of breath. 11/17/18   [name], MD  atorvastatin (LIPITOR) 80 MG tablet Take 1 tablet (80 mg total) by mouth daily at 6 PM. Patient not taking: Reported on 08/20/2020 01/18/20   [name], MD  Blood Glucose Monitoring Suppl (ACCU-CHEK GUIDE ME) w/Device KIT 1 kit by Does not apply route daily. 05/02/19   [name], MD  cetirizine (ZYRTEC) 10 MG tablet Take 1 tablet (10 mg total) by mouth daily. Patient not taking: Reported on 08/16/2020 12/14/18   [name] Stain, MD  cyclobenzaprine (FLEXERIL) 5 MG tablet TAKE 1 TABLET UP TO THREE TIMES DAILY AS NEEDED. DO NOT DRIVE, OPERATE MACHINERY, OR WORK WHILE TAKING THIS  MEDICATION. 09/25/20 09/25/21  Couture, Cortni S, PA-C  diclofenac Sodium (VOLTAREN) 1 % GEL APPLY 2 GRAMS TOPICALLY 4 (FOUR) TIMES DAILY. 09/25/20 09/25/21  Couture, Cortni S, PA-C  diltiazem (CARDIZEM CD) 300 MG 24 hr capsule TAKE 1 CAPSULE (300 MG TOTAL) BY MOUTH DAILY. 01/18/20 01/17/21  [name], MD  doxycycline (VIBRA-TABS) 100 MG tablet TAKE 1 CAPSULE (100 MG TOTAL) BY MOUTH 2 (TWO) TIMES DAILY. 10/03/20 10/03/21  Deno Etienne, DO  doxycycline (VIBRAMYCIN) 100 MG capsule Take 1 capsule (100 mg total) by mouth 2 (two) times daily. One po bid x 7 days 10/03/20   Deno Etienne, DO  fluticasone [facility]) 50 MCG/ACT nasal spray Place 2 sprays into both nostrils daily. 12/14/18   [name] Stain, MD  furosemide (LASIX) 20 MG tablet TAKE 1 TABLET (20 MG TOTAL) BY MOUTH AS NEEDED. 09/20/20 09/20/21  Erlene Quan, PA-C  gabapentin (NEURONTIN) 300 MG capsule TAKE  1 CAPSULE (300 MG TOTAL) BY MOUTH 2 (TWO) TIMES DAILY. 11/23/20 11/23/21  [name], MD  hydrochlorothiazide (MICROZIDE) 12.5 MG capsule TAKE 1 CAPSULE (12.5 MG TOTAL) BY MOUTH DAILY. 09/07/19 08/16/20  Erlene Quan, PA-C  metFORMIN (GLUCOPHAGE) 500 MG tablet Take 1 tablet (500 mg total) by mouth 2 (two) times daily with a meal. Patient not taking: Reported on 08/20/2020 01/18/20   [name], MD  metoprolol tartrate (LOPRESSOR) 25 MG tablet TAKE 1 TABLET (25 MG TOTAL) BY MOUTH 2 (TWO) TIMES DAILY. 04/10/20 04/10/21  Barrett, Evelene Croon, PA-C  metroNIDAZOLE (METROGEL) 0.75 % vaginal gel PLACE 1 APPLICATORFUL VAGINALLY AT BEDTIME. Patient not taking: Reported on 08/16/2020 06/25/20 06/25/21  [name], MD  Misc. Devices MISC Blood pressure monitor.  Diagnosis Hypertension 02/11/19   [name], MD  rivaroxaban (XARELTO) 20 MG TABS tablet TAKE 1 TABLET (20 MG TOTAL) BY MOUTH DAILY WITH SUPPER. 03/20/21 03/20/22  Croitoru, Mihai, MD    Physical Exam:  Constitutional: Middle-aged woman who appears to be in no acute distress at this time Vitals:   03/28/21 0815 03/28/21 0830 03/28/21 0845 03/28/21 0900  BP: (!) 139/107 (!) 136/95 (!) 136/104 (!) 145/103  Pulse: (!) 107 (!) 101 97 (!) 112  Resp: _0 Temp:      TempSrc:      SpO2: 99% 94% 97% 96%  Weight:      Height:       Eyes: PERRL, lids and conjunctivae normal ENMT: Mucous membranes are moist. Posterior pharynx clear of any exudate or lesions.  Neck: normal, supple, no masses, no thyromegaly.  No significant JVD appreciated Respiratory: Normal respiratory effort with mild crackles noted in bases.  No significant wheezing or rhonchi appreciated. Cardiovascular: Irregular irregular, no murmurs / rubs / gallops.  Trace lower extremity edema. 2+ pedal pulses. No carotid bruits.  Abdomen: no tenderness, no masses palpated. No hepatosplenomegaly. Bowel sounds positive.  Musculoskeletal: no clubbing / cyanosis. No joint  deformity upper and lower extremities. Good ROM, no contractures. Normal muscle tone.  Skin: no rashes, lesions, ulcers. No induration Neurologic: CN 2-12 grossly intact. Sensation intact, DTR normal. Strength 5/5 in all 4.  Psychiatric: Normal judgment and insight. Alert and oriented x 3. Normal mood.     Labs on Admission: I have personally reviewed following labs and imaging studies  CBC: Recent Labs  Lab 03/28/21 0529  WBC 7.8  NEUTROABS 5.3  HGB 12.2  HCT 40.5  MCV 84.2  PLT 725   Basic Metabolic Panel: Recent Labs  Lab 03/28/21  0529  NA 138  K 3.5  CL 108  CO2 21*  GLUCOSE 146*  BUN 12  CREATININE 0.92  CALCIUM 8.8*   GFR: Estimated Creatinine Clearance: 78.8 mL/min (by C-G formula based on SCr of 0.92 mg/dL). Liver Function Tests: No results for input(s): AST, ALT, ALKPHOS, BILITOT, PROT, ALBUMIN in the last 168 hours. No results for input(s): LIPASE, AMYLASE in the last 168 hours. No results for input(s): AMMONIA in the last 168 hours. Coagulation Profile: No results for input(s): INR, PROTIME in the last 168 hours. Cardiac Enzymes: No results for input(s): CKTOTAL, CKMB, CKMBINDEX, TROPONINI in the last 168 hours. BNP (last 3 results) No results for input(s): PROBNP in the last 8760 hours. HbA1C: No results for input(s): HGBA1C in the last 72 hours. CBG: No results for input(s): GLUCAP in the last 168 hours. Lipid Profile: No results for input(s): CHOL, HDL, LDLCALC, TRIG, CHOLHDL, LDLDIRECT in the last 72 hours. Thyroid Function Tests: No results for input(s): TSH, T4TOTAL, FREET4, T3FREE, THYROIDAB in the last 72 hours. Anemia Panel: No results for input(s): VITAMINB12, FOLATE, FERRITIN, TIBC, IRON, RETICCTPCT in the last 72 hours. Urine analysis:    Component Value Date/Time   COLORURINE YELLOW 11/02/2015 1136   APPEARANCEUR CLEAR 11/02/2015 1136   LABSPEC 1.016 11/02/2015 1136   PHURINE 6.0 11/02/2015 1136   GLUCOSEU NEGATIVE 11/02/2015 1136    HGBUR NEGATIVE 11/02/2015 1136   BILIRUBINUR negative 03/22/2020 1543   KETONESUR negative 03/22/2020 1543   KETONESUR NEGATIVE 11/02/2015 1136   PROTEINUR NEGATIVE 11/02/2015 1136   UROBILINOGEN 1.0 03/22/2020 1543   UROBILINOGEN 1 03/22/2015 1301   NITRITE Negative 03/22/2020 1543   NITRITE NEGATIVE 11/02/2015 1136   LEUKOCYTESUR Moderate (2+) (A) 03/22/2020 1543   Sepsis Labs: No results found for this or any previous visit (from the past 240 hour(s)).   Radiological Exams on Admission: DG Chest 2 View  Result Date: 03/28/2021 CLINICAL DATA:  55 year old female with shortness of breath and lower extremity swelling. EXAM: CHEST - 2 VIEW COMPARISON:  Chest radiograph 04/10/2020 and earlier. FINDINGS: Chronic cardiomegaly. Stable cardiac size and mediastinal contours. Visualized tracheal air column is within normal limits. Lung volumes and vascularity appear to be at baseline. No pneumothorax, pleural effusion or acute pulmonary opacity. No acute osseous abnormality identified. Negative visible bowel gas pattern. IMPRESSION: Cardiomegaly and baseline pulmonary vascular congestion without overt edema. Electronically Signed   By: Genevie Ann M.D.   On: 03/28/2021 06:09   CT Angio Chest PE W and/or Wo Contrast  Result Date: 03/28/2021 CLINICAL DATA:  55 year old female with history of shortness of breath for the past 2 days. Evaluate for pulmonary embolism. EXAM: CT ANGIOGRAPHY CHEST WITH CONTRAST TECHNIQUE: Multidetector CT imaging of the chest was performed using the standard protocol during bolus administration of intravenous contrast. Multiplanar CT image reconstructions and MIPs were obtained to evaluate the vascular anatomy. CONTRAST:  32m OMNIPAQUE IOHEXOL 350 MG/ML SOLN COMPARISON:  Chest CT 03/22/2014. FINDINGS: Cardiovascular: No filling defects within the pulmonary arterial tree to suggest pulmonary embolism. Heart size is enlarged with severe right atrial dilatation. There is no  significant pericardial fluid, thickening or pericardial calcification. There is aortic atherosclerosis, as well as atherosclerosis of the great vessels of the mediastinum and the coronary arteries, including calcified atherosclerotic plaque in the left main, left anterior descending, left circumflex and right coronary arteries. Mediastinum/Nodes: No pathologically enlarged mediastinal or hilar lymph nodes. Esophagus is unremarkable in appearance. No axillary lymphadenopathy. Lungs/Pleura: Trace bilateral pleural effusions lying  dependently. There is a background of ground-glass attenuation and scattered areas of mild interlobular septal thickening, suspicious for a background of mild interstitial pulmonary edema. 7 mm ground-glass attenuation nodule in the right upper lobe (axial image 47 of series 6) and several other smaller ground-glass attenuation nodules are noted, but nonspecific in the setting of presumed edema. No other larger more suspicious appearing pulmonary nodules or masses are noted. No confluent consolidative airspace disease. Upper Abdomen: Reflux of contrast material into the IVC and hepatic veins. Musculoskeletal: There are no aggressive appearing lytic or blastic lesions noted in the visualized portions of the skeleton. Review of the MIP images confirms the above findings. IMPRESSION: 1. No evidence of pulmonary embolism. 2. Cardiomegaly with evidence of congestive heart failure, including interstitial pulmonary edema and trace bilateral pleural effusions. 3. Ground-glass attenuation nodules measuring up to 7 mm in the right upper lobe. These are nonspecific, potentially related to underlying edema. Non-contrast chest CT at 3-6 months is recommended. If nodules persist, subsequent management will be based upon the most suspicious nodule(s). This recommendation follows the consensus statement: Guidelines for Management of Incidental Pulmonary Nodules Detected on CT Images: From the Fleischner  Society 2017; Radiology 2017; 284:228-243. 4. Aortic atherosclerosis, in addition to left main and 3 vessel coronary artery disease. Please note that although the presence of coronary artery calcium documents the presence of coronary artery disease, the severity of this disease and any potential stenosis cannot be assessed on this non-gated CT examination. Assessment for potential risk factor modification, dietary therapy or pharmacologic therapy may be warranted, if clinically indicated. Aortic Atherosclerosis (ICD10-I70.0). Electronically Signed   By: Vinnie Langton M.D.   On: 03/28/2021 08:10    EKG: Independently reviewed.  Atrial fibrillation with RVR rate 112 bpm  Assessment/Plan Atrial fibrillation with RVR on chronic anticoagulation: Patient reports being chronically in atrial fibrillation, but presented with heart rates elevated up into the 130s.  She had initially been given metoprolol 5 mg IV without any improvement in heart rates and started on a Cardizem drip.  Cardizem drip had to be paused due to drop in blood pressures.  Patient reported that she had taken today's dose of Cardizem and metoprolol at 3 AM this morning. CHA2DS2-VASc score = 6(CHF, sex, HTN, DM).  Heart rate is now controlled. -Admit to a progressive bed -Check TSH -Discontinued Cardizem drip -Replace electrolytes to maintain goal of at least potassium 4 and magnesium 2 -Continue Xarelto, Cardizem, and metoprolol as tolerated  Diastolic congestive heart failure exacerbation: On chronic.  Patient reports having weight gain of 10 pounds here recently.  On physical exam patient without significant lower extremity edema, lungs sound relatively clear except for some bibasilar crackles.  BNP was elevated at 325.5.  CTA of the chest had revealed no signs of a PE, but did note pulmonary vascular congestion.  Last echocardiogram from 04/2019 revealed EF of 60-65% mild left atrial dilation.  Patient had been given Lasix 20 mg  IV. -Strict I&O's -Daily weights -Check echocardiogram -Held off any further IV diuresis at this time due to soft blood pressures -Cardiology consulted, will follow-up for any further recommendation  Elevated troponin: Acute.  High-sensitivity troponins 103->107.  CTA may note of left main and three-vessel coronary artery disease.  Suspect secondary to demand, but question possibility of underlying disease.  Unclear if previous ischemic work-up done in the past. -Continue to monitor  Essential hypertension: After initiation of Cardizem drip blood pressures dropped as low as 80/65.  Patient had  taken her home metoprolol and losartan earlier this morning. -Continue p.o. Cardizem tomorrow morning metoprolol this evening at 1800 if blood pressures allow  Diabetes mellitus type 2: Last hemoglobin A1c was 5.9 in 01/2020.  Patient is no longer on metformin. -Check hemoglobin A1c -Modified diet  Hyperlipidemia: Last LDL 49 on 10/21/2019 -Check lipid panel in a.m. -Goal of a LDL less than 70 -Continue atorvastatin  History of CVA: Patient last was noted to have a right MCA stroke in 2019.  Patient reports no residual deficits. -Continue statin and Xarelto    DVT prophylaxis: Xarelto Code Status:  Full Family Communication: daughter  Disposition Plan: Home Consults called: Cardiology Admission status: Inpatient status to possibly require more than 2 midnight stay for need of IV diuresis.  Norval Morton MD [facility]   If 7PM-7AM, please contact night-coverage   03/28/2021, 9:06 AM

## 2021-03-28 NOTE — Progress Notes (Signed)
  Echocardiogram 2D Echocardiogram with definity has been performed. Dr. Aundra Dubin contacted to read at 13:20.  Darlina Sicilian M 03/28/2021, 1:20 PM

## 2021-03-28 NOTE — Plan of Care (Signed)

## 2021-03-28 NOTE — Consult Note (Signed)
Cardiology Consultation:   Patient ID: CHERYSH EPPERLY MRN: [id number]; DOB: Apr 13, 1966  Admit date: 03/28/2021 Date of Consult: 03/28/2021  PCP:  [name], MD   [facility] Providers Cardiologist:  Sanda Klein, MD        Patient Profile:   [name] is a 55 y.o. female with a hx of Afib on Xarelto, HFpEF, CVA, OSA (AHI of 72.9/h), HTN, and T2DM who is being seen 03/28/2021 for the evaluation of heart failure exacerbation and afib w/ RVR at the request of Tamala Julian.  History of Present Illness:   [name] with a history of diastolic heart failure and chronic A. fib presents to the ED today due to progressive dyspnea on exertion.  Patient reports over the last week, she has become more "out of breath" and often had to rest to catch her breath for at the grocery store.  She is able to walk about a block without getting out of breath.  She has also had difficulty laying flat at night due to increased shortness of breath.  She sleeps on 3 pillows at night and endorses occasional PND. Reports gaining 10 lbs in the last few weeks. For the past few days, she has noticed increase swelling around her ankles and fatigue. Patient also reports having increased palpitations, especially when she lays on her left side or when she bends down during exercise. She denies any chest pain, abdominal pain, N/V, fevers or dysuria. No shortness of breath and palpitations have worsened over the past 2 days.  This morning, she took both her metoprolol and diltiazem and presented to the ED for further evaluation.   On admission, patient was found to be in A. fib with RVR (HR 105-130s). Labs showed BNP 325.5,  troponin 103-> 107. CXR showed cardiomegaly with baseline pulmonary vascular congestion without edema. CTA negative for PE but showed stable cardiomegaly with some interstitial edema and trace bilateral pleural effusions. Patient received IV metoprolol 5 mg, IV Lasix 20 mg w/ UOP of 1.8 L and started on  diltiazem drip. Patient admitted to hospitalist service cardiology consulted for management of A. fib and heart failure.  On evaluation, patient on 1 L [place] for comfort. She continues to have shortness of breath but the palpitations as resolved. She continues to deny any chest pain.  Reports her abdomen feels swollen.   Past Medical History:  Diagnosis Date   Clotting disorder ([place])    on xarelto   Essential hypertension    Heart murmur    a. 10/2015 Echo: EF 60-65%, no rwma, mild AI/MR, sev dil LA/RA, PASP 38mmHg.   Noncompliance    NSVT (nonsustained ventricular tachycardia) ([place])    a. 10/2015 during admission for CVA/AF.   Paroxysmal A-fib ([place])    a. CHA2DS2VASC = 4-->Xarelto;  b. 02/2016 Successful DCCV.   Pneumonia 2012 x 2; 2015   Stroke [facility])    a. 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   Transient ischemic attack (TIA) 01/2013    Past Surgical History:  Procedure Laterality Date   CARDIOVERSION N/A 02/18/2013   Procedure: CARDIOVERSION;  Surgeon: Sanda Klein, MD;  Location: [facility];  Service: Cardiovascular;  Laterality: N/A;   CARDIOVERSION N/A 02/23/2013   Procedure: CARDIOVERSION;  Surgeon: Leonie Man, MD;  Location: [facility];  Service: Cardiovascular;  Laterality: N/A;  [place] CV   CARDIOVERSION N/A 03/12/2016   Procedure: CARDIOVERSION;  Surgeon: Larey Dresser, MD;  Location: [facility] ENDOSCOPY;  Service: Cardiovascular;  Laterality: N/A;   TEE WITHOUT CARDIOVERSION N/A 02/18/2013   Procedure: TRANSESOPHAGEAL ECHOCARDIOGRAM (TEE);  Surgeon: Sanda Klein, MD;  Location: [facility] ENDOSCOPY;  Service: Cardiovascular;  Laterality: N/A;   TUBAL LIGATION  1992     Home Medications:  Prior to Admission medications   Medication Sig Start Date End Date Taking? Authorizing Provider  atorvastatin (LIPITOR) 80 MG tablet Take 1 tablet (80 mg total) by mouth daily at 6 PM. 01/18/20  Yes Newlin, Enobong,  MD  cyclobenzaprine (FLEXERIL) 5 MG tablet TAKE 1 TABLET UP TO THREE TIMES DAILY AS NEEDED. DO NOT DRIVE, OPERATE MACHINERY, OR WORK WHILE TAKING THIS MEDICATION. Patient taking differently: Take 5 mg by mouth 3 (three) times daily as needed for muscle spasms. 09/25/20 09/25/21 Yes Couture, Cortni S, PA-C  diltiazem (CARDIZEM CD) 300 MG 24 hr capsule TAKE 1 CAPSULE (300 MG TOTAL) BY MOUTH DAILY. 01/18/20 03/28/21 Yes Newlin, Charlane Ferretti, MD  furosemide (LASIX) 20 MG tablet TAKE 1 TABLET (20 MG TOTAL) BY MOUTH AS NEEDED. Patient taking differently: Take 20 mg by mouth daily as needed (swelling of feet and ankles). 09/20/20 09/20/21 Yes Kilroy, Luke K, PA-C  gabapentin (NEURONTIN) 300 MG capsule TAKE 1 CAPSULE (300 MG TOTAL) BY MOUTH 2 (TWO) TIMES DAILY. 11/23/20 11/23/21 Yes Newlin, Charlane Ferretti, MD  hydrochlorothiazide (MICROZIDE) 12.5 MG capsule TAKE 1 CAPSULE (12.5 MG TOTAL) BY MOUTH DAILY. 09/07/19 03/28/21 Yes Kilroy, Luke K, PA-C  metoprolol tartrate (LOPRESSOR) 25 MG tablet TAKE 1 TABLET (25 MG TOTAL) BY MOUTH 2 (TWO) TIMES DAILY. 04/10/20 04/10/21 Yes Barrett, Evelene Croon, PA-C  rivaroxaban (XARELTO) 20 MG TABS tablet TAKE 1 TABLET (20 MG TOTAL) BY MOUTH DAILY WITH SUPPER. 03/20/21 03/20/22 Yes Croitoru, Mihai, MD  Accu-Chek FastClix Lancets MISC USE AS INSTRUCTED TO CHECK BLOOD SUGAR ONCE DAILY. 03/07/20   [name], MD  ACCU-CHEK GUIDE test strip USE AS INSTRUCTED TO CHECK BLOOD SUGAR ONCE DAILY. 03/13/20   [name], MD  acetaminophen (TYLENOL) 500 MG tablet Take 1,000 mg by mouth every 6 (six) hours as needed for headache.     [provider]  Blood Glucose Monitoring Suppl (ACCU-CHEK GUIDE ME) w/Device KIT 1 kit by Does not apply route daily. 05/02/19   [name], MD  Misc. Devices MISC Blood pressure monitor.  Diagnosis Hypertension 02/11/19   [name], MD  albuterol (PROVENTIL HFA;VENTOLIN HFA) 108 (90 Base) MCG/ACT inhaler Inhale 2 puffs into the lungs every 6 (six) hours as needed for  wheezing or shortness of breath. Patient not taking: Reported on 03/28/2021 11/17/18 03/28/21  [name], MD  cetirizine (ZYRTEC) 10 MG tablet Take 1 tablet (10 mg total) by mouth daily. Patient not taking: No sig reported 12/14/18 03/28/21  [name] Stain, MD  fluticasone [facility]) 50 MCG/ACT nasal spray Place 2 sprays into both nostrils daily. Patient not taking: Reported on 03/28/2021 12/14/18 03/28/21  [name] Stain, MD  metFORMIN (GLUCOPHAGE) 500 MG tablet Take 1 tablet (500 mg total) by mouth 2 (two) times daily with a meal. Patient not taking: No sig reported 01/18/20 03/28/21  [name], MD    Inpatient Medications: Scheduled Meds:  atorvastatin  80 mg Oral q1800   [START ON 03/29/2021] diltiazem  300 mg Oral Daily   gabapentin  300 mg Oral BID   metoprolol tartrate  25 mg Oral BID   rivaroxaban  20 mg Oral Q supper   sodium chloride flush  3 mL Intravenous Q12H   Continuous Infusions:  sodium chloride     PRN Meds: sodium chloride, acetaminophen, cyclobenzaprine, ondansetron (ZOFRAN) IV, sodium chloride flush  Allergies:    Allergies  Allergen Reactions   Penicillins Hives and Other (See Comments)    Unknown  Has patient had a PCN reaction causing immediate rash, facial/tongue/throat swelling, SOB or lightheadedness with hypotension: No Has patient had a PCN reaction causing severe rash involving mucus membranes or skin necrosis: No Has patient had a PCN reaction that required hospitalization No Has patient had a PCN reaction occurring within the last 10 years: No If all of the above answers are "NO", then may proceed with Cephalosporin use.    Social History:   Social History   Socioeconomic History   Marital status: Single    Spouse name: Not on file   Number of children: Not on file   Years of education: Not on file   Highest education level: Not on file  Occupational History   Occupation: Airline pilot: [employer]   Occupation: in  home health aide  Tobacco Use   Smoking status: Former    Years: 3.00    Types: Cigarettes    Quit date: 03/15/2013    Years since quitting: 8.0   Smokeless tobacco: Never  Vaping Use   Vaping Use: Never used  Substance and Sexual Activity   Alcohol use: Yes    Comment: drinks on the weekends,   2-3 beers   Drug use: No   Sexual activity: Not Currently    Birth control/protection: None  Other Topics Concern   Not on file  Social History Narrative   Not on file   Social Determinants of Health   Financial Resource Strain: Not on file  Food Insecurity: Not on file  Transportation Needs: Not on file  Physical Activity: Not on file  Stress: Not on file  Social Connections: Not on file  Intimate Partner Violence: Not on file    Family History:    Family History  Problem Relation Age of Onset   Cancer Mother    Atrial fibrillation Mother    Breast cancer Mother    Hypertension Father    Atrial fibrillation Father    Peripheral vascular disease Father    Hypertension Other    Diabetes Other    Heart attack Neg Hx    Stroke Neg Hx    Rectal cancer Neg Hx    Colon cancer Neg Hx    Esophageal cancer Neg Hx    Stomach cancer Neg Hx      ROS:  Please see the history of present illness.   All other ROS reviewed and negative.     Physical Exam/Data:   Vitals:   03/28/21 0950 03/28/21 0955 03/28/21 1000 03/28/21 1041  BP: (!) 86/65 (!) 89/66 (!) 80/65 (!) 84/66  Pulse: 65 61 69 63  Resp: $Remo'18 19 20 20  'DrkJh$ Temp:   98.6 F (37 C)   TempSrc:      SpO2: 95% 99% 98% 99%  Weight:    83.5 kg  Height:        Intake/Output Summary (Last 24 hours) at 03/28/2021 1422 Last data filed at 03/28/2021 1003 Gross per 24 hour  Intake --  Output 1800 ml  Net -1800 ml   Last 3 Weights 03/28/2021 03/28/2021 10/03/2020  Weight (lbs) 184 lb 1.4 oz 222 lb 222 lb  Weight (kg) 83.5 kg 100.699 kg 100.699 kg     Body mass index is  31.6 kg/m.  General: Obese, pleasant, well developed  middle-aged woman laying in bed.  NAD.  HEENT: normal Lymph: no adenopathy Neck: no JVD Endocrine:  No thryomegaly Vascular: No carotid bruits; FA pulses 2+ bilaterally without bruits  Cardiac:  normal S1, S2; normal rate. Irregularly irregular rhythm; no murmur  Lungs: Lungs CTAB. Decreased breath sounds at the bases with distant rales.  No wheezing or rhonchi.  Normal effort. Abd: soft, nontender, no hepatomegaly.  Normal bowel sounds Ext: Trace bilateral lower extremity pitting edema Musculoskeletal:  No deformities, BUE and BLE strength normal and equal Skin: warm and dry  Neuro:  A&Ox3. No focal abnormalities noted. Psych:  Normal affect   EKG:  The EKG was personally reviewed and demonstrates:  Afib with RVR, PVCs Telemetry:  Telemetry was personally reviewed and demonstrates: A. fib with HR in the 60s and 70s.  Relevant CV Studies: ECHO 10/2015: EF 60-65%, moderate LVH, severe LAE and RAE ECHO 01/2018: EF 60-65%, mid LVH, mod LAE, 1.2 x 2.2 cm thrombus in high atrial cavity ECHO 04/2019: EF 60-65%, mild LVH, mod LA dilation Echo 03/28/2021: Pending  Laboratory Data:  High Sensitivity Troponin:   Recent Labs  Lab 03/28/21 0529 03/28/21 0727  TROPONINIHS 103* 107*     Chemistry Recent Labs  Lab 03/28/21 0529  NA 138  K 3.5  CL 108  CO2 21*  GLUCOSE 146*  BUN 12  CREATININE 0.92  CALCIUM 8.8*  GFRNONAA >60  ANIONGAP 9    No results for input(s): PROT, ALBUMIN, AST, ALT, ALKPHOS, BILITOT in the last 168 hours. Hematology Recent Labs  Lab 03/28/21 0529  WBC 7.8  RBC 4.81  HGB 12.2  HCT 40.5  MCV 84.2  MCH 25.4*  MCHC 30.1  RDW 15.6*  PLT 213   BNP Recent Labs  Lab 03/28/21 0529  BNP 325.5*    DDimer No results for input(s): DDIMER in the last 168 hours.   Radiology/Studies:  DG Chest 2 View  Result Date: 03/28/2021 CLINICAL DATA:  55 year old female with shortness of breath and lower extremity swelling. EXAM: CHEST - 2 VIEW COMPARISON:   Chest radiograph 04/10/2020 and earlier. FINDINGS: Chronic cardiomegaly. Stable cardiac size and mediastinal contours. Visualized tracheal air column is within normal limits. Lung volumes and vascularity appear to be at baseline. No pneumothorax, pleural effusion or acute pulmonary opacity. No acute osseous abnormality identified. Negative visible bowel gas pattern. IMPRESSION: Cardiomegaly and baseline pulmonary vascular congestion without overt edema. Electronically Signed   By: Genevie Ann M.D.   On: 03/28/2021 06:09   CT Angio Chest PE W and/or Wo Contrast  Result Date: 03/28/2021 CLINICAL DATA:  55 year old female with history of shortness of breath for the past 2 days. Evaluate for pulmonary embolism. EXAM: CT ANGIOGRAPHY CHEST WITH CONTRAST TECHNIQUE: Multidetector CT imaging of the chest was performed using the standard protocol during bolus administration of intravenous contrast. Multiplanar CT image reconstructions and MIPs were obtained to evaluate the vascular anatomy. CONTRAST:  15mL OMNIPAQUE IOHEXOL 350 MG/ML SOLN COMPARISON:  Chest CT 03/22/2014. FINDINGS: Cardiovascular: No filling defects within the pulmonary arterial tree to suggest pulmonary embolism. Heart size is enlarged with severe right atrial dilatation. There is no significant pericardial fluid, thickening or pericardial calcification. There is aortic atherosclerosis, as well as atherosclerosis of the great vessels of the mediastinum and the coronary arteries, including calcified atherosclerotic plaque in the left main, left anterior descending, left circumflex and right coronary arteries. Mediastinum/Nodes: No pathologically enlarged mediastinal or hilar lymph  nodes. Esophagus is unremarkable in appearance. No axillary lymphadenopathy. Lungs/Pleura: Trace bilateral pleural effusions lying dependently. There is a background of ground-glass attenuation and scattered areas of mild interlobular septal thickening, suspicious for a background  of mild interstitial pulmonary edema. 7 mm ground-glass attenuation nodule in the right upper lobe (axial image 47 of series 6) and several other smaller ground-glass attenuation nodules are noted, but nonspecific in the setting of presumed edema. No other larger more suspicious appearing pulmonary nodules or masses are noted. No confluent consolidative airspace disease. Upper Abdomen: Reflux of contrast material into the IVC and hepatic veins. Musculoskeletal: There are no aggressive appearing lytic or blastic lesions noted in the visualized portions of the skeleton. Review of the MIP images confirms the above findings. IMPRESSION: 1. No evidence of pulmonary embolism. 2. Cardiomegaly with evidence of congestive heart failure, including interstitial pulmonary edema and trace bilateral pleural effusions. 3. Ground-glass attenuation nodules measuring up to 7 mm in the right upper lobe. These are nonspecific, potentially related to underlying edema. Non-contrast chest CT at 3-6 months is recommended. If nodules persist, subsequent management will be based upon the most suspicious nodule(s). This recommendation follows the consensus statement: Guidelines for Management of Incidental Pulmonary Nodules Detected on CT Images: From the Fleischner Society 2017; Radiology 2017; 284:228-243. 4. Aortic atherosclerosis, in addition to left main and 3 vessel coronary artery disease. Please note that although the presence of coronary artery calcium documents the presence of coronary artery disease, the severity of this disease and any potential stenosis cannot be assessed on this non-gated CT examination. Assessment for potential risk factor modification, dietary therapy or pharmacologic therapy may be warranted, if clinically indicated. Aortic Atherosclerosis (ICD10-I70.0). Electronically Signed   By: Vinnie Langton M.D.   On: 03/28/2021 08:10     Assessment and Plan:   Acute on chronic diastolic HF: Last echo in 2020  showed EF 60-65%, mild LVH, mod LA dilation.  Patient has had progressive dyspnea on exertion, palpitations, orthopnea, LE and gut edema and PND. BNP 325.5, troponin 103->107. Mag, sCr wnl. Not significantly fluid overloaded on exam.  Repeat echo shows EF 25 to 30%, global hypokinesis of LV, mild LVH, mild LAE, moderate LAE, moderate RV enlarged and severe TR. Symptoms likely due to progression of her heart failure. Possibly an element of dietary indiscretion. Newly reduced EF could be 2/2 uncontrolled Afib but unclear if rates have remain persistently high over the years. No wall motion abnormality makes ischemic cause less likely, but will recommend coronary CT for further ischemic evaluation during this admission  --Hold Lasix today in the setting of low BP, reassess tomorrow  --Resume metoprolol 25 mg BID tomorrow  --Discontinue diltiazem in the setting of now severely reduced EF  --Will schedule for coronary CT during this admission --Optimize GDMT before discharge by slowing adding Entresto, Spirolactone and  [place] with improvement in BP.   --Strict I&O  --Daily weight  --Closely monitor kidney function, electrolytes  Afib: On Xarelto, diltiazem and metoprolol. Patient found to be in A. fib with RVR with rates in the 130s. Patient reports she has had increased palpitations with associated shortness of breath the past few days. Reported taking her diltiazem and metoprolol this morning. Initially started on diltiazem drip but this was discontinued due to hypotension. Patient remains in A. fib with HR improved to 60s-70s. Patient's DOE and LE edema likely secondary to heart failure exacerbation in the setting of uncontrolled A. Fib. Her uncontrolled OSA likely contributing to her uncontrolled  afib. TSH and mag wnl.  --Telemetry --Continue Xarelto 20 mg daily --Discontinue diltiazem as above --Continue metoprolol 25 mg twice daily --Monitor electrolytes  HTN: History of hypertension controlled  with medication.  BP of 143/123 on admission. Some drop in BP to SBP in the 80s-90s after IV Lasix and diltiazem drip. BP still soft with SBP in the 100s. HR in the 60s so recommend holding next dose of metoprolol.  --Continue metoprolol 25 mg BID starting tomorrow.   HLD: Last lipid panel in 10/2019 show LDL 49, HDL 36. On atorvastatin 80 mg daily. --Continue atorvastatin 80 mg daily --F/u repeat lipid panel  Hx of CVA, TIA: TIA in 1610 and embolic stroke in 9604. No residual deficits.  -- Continue Xarelto 20 mg daily  T2DM: A1c 6.7.  Management per primary team  OSA: Currently uncontrolled. Has BIPAP and CPAP at home but does not use them due to the logistics of her bedroom. Patient has had difficulty sleeping due to orthopnea. OSA likely contributing to her worsening A. fib. --CPAP at night   Risk Assessment/Risk Scores:   New York Heart Association (NYHA) Functional Class NYHA Class II  CHA2DS2-VASc Score = 6. This indicates a 9.7% annual risk of stroke. The patient's score is based upon: CHF History: Yes HTN History: Yes Diabetes History: Yes Stroke History: Yes Vascular Disease History: No Age Score: 0 Gender Score: 1      For questions or updates, please contact [place] Please consult [email or web address] for contact info under    Signed, Lacinda Axon, MD  03/28/2021 1:15 PM  History and all data above reviewed.  Patient examined.  I agree with the findings as above.   The patient has what seems to be permanent atrial fibrillation.  Looking back at many EKGs over couple of years its been fibrillation.  She comes in now with shortness of breath.  She works in a [employer] and she has been more dyspneic with her work.  She chronically sleeps with her head elevated and this is not new.  She supposed to be using CPAP but she does not have physical room for it or she sleeps.  She has not really describe PND or orthopnea.  She is not describing chest pressure, neck  or arm discomfort.  She does not weigh her self routinely.  She thinks however she has gained about 10 pounds.  She has some mild lower extremity swelling.  She came in because of the shortness of breath and she was noted to have mildly elevated BNP.  Echocardiography does demonstrate that her ejection fraction which previously had been normal is now reduced to about 30% with global hypokinesis.  There is tricuspid regurgitation but no significant evidence of pulmonary hypertension.  The patient exam reveals COR: Regular rate and rhythm, quiet precordium without obvious murmurs lungs: Clear to auscultation bilaterally,  Abd: Positive bowel sounds normal in frequency and pitch, Ext mild bilateral lower extremity edema.  All available labs, radiology testing, previous records reviewed. Agree with documented assessment and plan.  Acute systolic heart failure: The patient has new acute systolic heart failure.  The etiology is not clear.  There have been recorded rapid ventricular rates on some ER visits but predominantly this seems to be rate controlled fibrillation.  I do not strongly suspect an ischemic etiology to her cardiomyopathy and it may well be rate related etiology but we should exclude coronary disease.  I think she could have coronary CT angiography.  In the  meantime we will titrate meds.  I will start with a low-dose of beta-blocker and try to start Entresto this visit as well.  She was taking Cardizem but this will be held.  We will continue with IV diuresis.  Atrial fibrillation: Patient was in regular rhythm coming into the room.  However, we repeated an EKG and it appears to be fibrillation but with a junctional escape rhythm in the 60s.  It does not appear to be sinus rhythm.  She will remain on anticoagulation.  I am not anticipating an invasive evaluation.  Jeneen Rinks Sarit Sparano  4:27 PM  03/28/2021

## 2021-03-28 NOTE — ED Provider Notes (Signed)
[facility] EMERGENCY DEPARTMENT Provider Note   CSN: [id number] Arrival date & time: 03/28/21  0453     History Chief Complaint  Patient presents with   Shortness of Breath    [name] is a 55 y.o. female.  Pt presents to the ED today with sob.  Pt said sx have been going on for several days.  The pt denies any cp.  She said sob is worse with lying back.  She has a hx of a.fib and is always in it.  She is on Xarelto. She took her meds prior to leaving her house.  She denies cough or f/c.      Past Medical History:  Diagnosis Date   Clotting disorder ([place])    on xarelto   Essential hypertension    Heart murmur    a. 10/2015 Echo: EF 60-65%, no rwma, mild AI/MR, sev dil LA/RA, PASP 26mHg.   Noncompliance    NSVT (nonsustained ventricular tachycardia) ([place]    a. 10/2015 during admission for CVA/AF.   Paroxysmal A-fib ([place]    a. CHA2DS2VASC = 4-->Xarelto;  b. 02/2016 Successful DCCV.   Pneumonia 2012 x 2; 2015   Stroke ([facility]    a. 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   Transient ischemic attack (TIA) 01/2013    Patient Active Problem List   Diagnosis Date Noted   Longstanding persistent atrial fibrillation ([place] 04/16/2020   Secondary hypercoagulable state ([place] 04/16/2020   Edema leg 05/17/2019   Severe obesity (BMI 35.0-39.9) with comorbidity ([place] 04/04/2019   Prediabetes 04/04/2019   Seasonal allergic rhinitis due to pollen 12/14/2018   Cough 12/14/2018   Chronic back pain 08/23/2018   Chronic diastolic CHF (congestive heart failure) ([place] 06/23/2018   OSA on CPAP 06/23/2018   Acute ischemic right MCA stroke ([place] 02/08/2018   Headache 01/29/2017   Refractive errors 04/15/2016   Medical non-compliance 11/06/2015   Hyperlipidemia LDL goal <70 11/06/2015   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   NSVT (nonsustained ventricular tachycardia) ([place]  11/06/2015   History of CVA (cerebrovascular accident)    Claudication of both lower extremities ([place] 03/22/2015   Chronic atrial fibrillation ([place] 04/12/2014   Pap smear for cervical cancer screening 03/28/2014   Cigarette smoker 06/15/2013   Ventral hernia 06/15/2013   Chronic anticoagulation 02/28/2013   Hx-TIA (transient ischemic attack) 02/05/13 02/15/2013   Essential hypertension 02/15/2013    Past Surgical History:  Procedure Laterality Date   CARDIOVERSION N/A 02/18/2013   Procedure: CARDIOVERSION;  Surgeon: MSanda Klein MD;  [place]  Service: Cardiovascular;  Laterality: N/A;   CARDIOVERSION N/A 02/23/2013   Procedure: CARDIOVERSION;  Surgeon: DLeonie Man MD;  [place]  Service: Cardiovascular;  Laterality: N/A;  BESIDE CV   CARDIOVERSION N/A 03/12/2016   Procedure: CARDIOVERSION;  Surgeon: DLarey Dresser MD;  [place]: [place]  Service: Cardiovascular;  Laterality: N/A;   TEE WITHOUT CARDIOVERSION N/A 02/18/2013   Procedure: TRANSESOPHAGEAL ECHOCARDIOGRAM (TEE);  Surgeon: MSanda Klein MD;  [facility]  Service: Cardiovascular;  Laterality: N/A;   TUBAL LIGATION  1992     OB History   No obstetric history on file.     Family History  Problem Relation Age of Onset   Cancer Mother    Atrial fibrillation Mother    Breast cancer Mother    Hypertension Father    Atrial fibrillation Father    Peripheral vascular  disease Father    Hypertension Other    Diabetes Other    Heart attack Neg Hx    Stroke Neg Hx    Rectal cancer Neg Hx    Colon cancer Neg Hx    Esophageal cancer Neg Hx    Stomach cancer Neg Hx     Social History   Tobacco Use   Smoking status: Former    Years: 3.00    Types: Cigarettes    Quit date: 03/15/2013    Years since quitting: 8.0   Smokeless tobacco: Never  Vaping Use   Vaping Use: Never used  Substance Use Topics   Alcohol use: Yes    Comment: drinks on the weekends,   2-3 beers   Drug  use: No    Home Medications Prior to Admission medications   Medication Sig Start Date End Date Taking? Authorizing Provider  Accu-Chek FastClix Lancets MISC USE AS INSTRUCTED TO CHECK BLOOD SUGAR ONCE DAILY. 03/07/20   [name], MD  ACCU-CHEK GUIDE test strip USE AS INSTRUCTED TO CHECK BLOOD SUGAR ONCE DAILY. 03/13/20   [name], MD  acetaminophen (TYLENOL) 500 MG tablet Take 1,000 mg by mouth every 6 (six) hours as needed for headache.     [provider]  albuterol (PROVENTIL HFA;VENTOLIN HFA) 108 (90 Base) MCG/ACT inhaler Inhale 2 puffs into the lungs every 6 (six) hours as needed for wheezing or shortness of breath. 11/17/18   [name], MD  atorvastatin (LIPITOR) 80 MG tablet Take 1 tablet (80 mg total) by mouth daily at 6 PM. Patient not taking: Reported on 08/20/2020 01/18/20   [name], MD  Blood Glucose Monitoring Suppl (ACCU-CHEK GUIDE ME) w/Device KIT 1 kit by Does not apply route daily. 05/02/19   [name], MD  cetirizine (ZYRTEC) 10 MG tablet Take 1 tablet (10 mg total) by mouth daily. Patient not taking: Reported on 08/16/2020 12/14/18   [name] Stain, MD  cyclobenzaprine (FLEXERIL) 5 MG tablet TAKE 1 TABLET UP TO THREE TIMES DAILY AS NEEDED. DO NOT DRIVE, OPERATE MACHINERY, OR WORK WHILE TAKING THIS MEDICATION. 09/25/20 09/25/21  Couture, Cortni S, PA-C  diclofenac Sodium (VOLTAREN) 1 % GEL APPLY 2 GRAMS TOPICALLY 4 (FOUR) TIMES DAILY. 09/25/20 09/25/21  Couture, Cortni S, PA-C  diltiazem (CARDIZEM CD) 300 MG 24 hr capsule TAKE 1 CAPSULE (300 MG TOTAL) BY MOUTH DAILY. 01/18/20 01/17/21  [name], MD  doxycycline (VIBRA-TABS) 100 MG tablet TAKE 1 CAPSULE (100 MG TOTAL) BY MOUTH 2 (TWO) TIMES DAILY. 10/03/20 10/03/21  Deno Etienne, DO  doxycycline (VIBRAMYCIN) 100 MG capsule Take 1 capsule (100 mg total) by mouth 2 (two) times daily. One po bid x 7 days 10/03/20   Deno Etienne, DO  fluticasone [facility]) 50 MCG/ACT nasal spray Place 2 sprays into both  nostrils daily. 12/14/18   [name] Stain, MD  furosemide (LASIX) 20 MG tablet TAKE 1 TABLET (20 MG TOTAL) BY MOUTH AS NEEDED. 09/20/20 09/20/21  Erlene Quan, PA-C  gabapentin (NEURONTIN) 300 MG capsule TAKE 1 CAPSULE (300 MG TOTAL) BY MOUTH 2 (TWO) TIMES DAILY. 11/23/20 11/23/21  [name], MD  hydrochlorothiazide (MICROZIDE) 12.5 MG capsule TAKE 1 CAPSULE (12.5 MG TOTAL) BY MOUTH DAILY. 09/07/19 08/16/20  Erlene Quan, PA-C  metFORMIN (GLUCOPHAGE) 500 MG tablet Take 1 tablet (500 mg total) by mouth 2 (two) times daily with a meal. Patient not taking: Reported on 08/20/2020 01/18/20   [name], MD  metoprolol tartrate (LOPRESSOR) 25 MG tablet TAKE  1 TABLET (25 MG TOTAL) BY MOUTH 2 (TWO) TIMES DAILY. 04/10/20 04/10/21  Barrett, Evelene Croon, PA-C  metroNIDAZOLE (METROGEL) 0.75 % vaginal gel PLACE 1 APPLICATORFUL VAGINALLY AT BEDTIME. Patient not taking: Reported on 08/16/2020 06/25/20 06/25/21  [name], MD  Misc. Devices MISC Blood pressure monitor.  Diagnosis Hypertension 02/11/19   [name], MD  rivaroxaban (XARELTO) 20 MG TABS tablet TAKE 1 TABLET (20 MG TOTAL) BY MOUTH DAILY WITH SUPPER. 03/20/21 03/20/22  Croitoru, Mihai, MD    Allergies    Penicillins  Review of Systems   Review of Systems  Respiratory:  Positive for shortness of breath.   All other systems reviewed and are negative.  Physical Exam Updated Vital Signs BP (!) 145/103   Pulse (!) 112   Temp 98.1 F (36.7 C) (Oral)   Resp 17   Ht _0  (1.626 m)   Wt 100.7 kg   SpO2 96%   BMI 38.11 kg/m   Physical Exam Vitals and nursing note reviewed.  Constitutional:      Appearance: She is well-developed.  HENT:     Head: Normocephalic and atraumatic.     Mouth/Throat:     Mouth: Mucous membranes are moist.     Pharynx: Oropharynx is clear.  Eyes:     Extraocular Movements: Extraocular movements intact.     Pupils: Pupils are equal, round, and reactive to light.  Cardiovascular:     Rate and Rhythm:  Tachycardia present. Rhythm irregular.  Pulmonary:     Breath sounds: Rhonchi present.  Abdominal:     General: Bowel sounds are normal.     Palpations: Abdomen is soft.  Musculoskeletal:        General: Normal range of motion.     Cervical back: Normal range of motion and neck supple.  Skin:    General: Skin is warm.     Capillary Refill: Capillary refill takes less than 2 seconds.  Neurological:     General: No focal deficit present.     Mental Status: She is alert and oriented to person, place, and time.  Psychiatric:        Mood and Affect: Mood normal.        Behavior: Behavior normal.    ED Results / Procedures / Treatments   Labs (all labs ordered are listed, but only abnormal results are displayed) Labs Reviewed  CBC WITH DIFFERENTIAL/PLATELET - Abnormal; Notable for the following components:      Result Value   MCH 25.4 (*)    RDW 15.6 (*)    All other components within normal limits  BASIC METABOLIC PANEL - Abnormal; Notable for the following components:   CO2 21 (*)    Glucose, Bld 146 (*)    Calcium 8.8 (*)    All other components within normal limits  BRAIN NATRIURETIC PEPTIDE - Abnormal; Notable for the following components:   B Natriuretic Peptide 325.5 (*)    All other components within normal limits  TROPONIN I (HIGH SENSITIVITY) - Abnormal; Notable for the following components:   Troponin I (High Sensitivity) 103 (*)    All other components within normal limits  TROPONIN I (HIGH SENSITIVITY) - Abnormal; Notable for the following components:   Troponin I (High Sensitivity) 107 (*)    All other components within normal limits  RESP PANEL BY RT-PCR (FLU A&B, COVID) [id number]    EKG EKG Interpretation  Date/Time:  Thursday March 28 2021 05:05:11 EDT Ventricular Rate:  112 PR Interval:  QRS Duration: 78 QT Interval:  360 QTC Calculation: 491 R Axis:   269 Text Interpretation: Atrial fibrillation with rapid ventricular response with premature  ventricular or aberrantly conducted complexes Right superior axis deviation Cannot rule out Anterior infarct , age undetermined ST & T wave abnormality, consider lateral ischemia Abnormal ECG Since last tracing rate faster Confirmed by Isla Pence [phone]) on 03/28/2021 6:57:08 AM  Radiology DG Chest 2 View  Result Date: 03/28/2021 CLINICAL DATA:  55 year old female with shortness of breath and lower extremity swelling. EXAM: CHEST - 2 VIEW COMPARISON:  Chest radiograph 04/10/2020 and earlier. FINDINGS: Chronic cardiomegaly. Stable cardiac size and mediastinal contours. Visualized tracheal air column is within normal limits. Lung volumes and vascularity appear to be at baseline. No pneumothorax, pleural effusion or acute pulmonary opacity. No acute osseous abnormality identified. Negative visible bowel gas pattern. IMPRESSION: Cardiomegaly and baseline pulmonary vascular congestion without overt edema. Electronically Signed   By: Genevie Ann M.D.   On: 03/28/2021 06:09   CT Angio Chest PE W and/or Wo Contrast  Result Date: 03/28/2021 CLINICAL DATA:  55 year old female with history of shortness of breath for the past 2 days. Evaluate for pulmonary embolism. EXAM: CT ANGIOGRAPHY CHEST WITH CONTRAST TECHNIQUE: Multidetector CT imaging of the chest was performed using the standard protocol during bolus administration of intravenous contrast. Multiplanar CT image reconstructions and MIPs were obtained to evaluate the vascular anatomy. CONTRAST:  71m OMNIPAQUE IOHEXOL 350 MG/ML SOLN COMPARISON:  Chest CT 03/22/2014. FINDINGS: Cardiovascular: No filling defects within the pulmonary arterial tree to suggest pulmonary embolism. Heart size is enlarged with severe right atrial dilatation. There is no significant pericardial fluid, thickening or pericardial calcification. There is aortic atherosclerosis, as well as atherosclerosis of the great vessels of the mediastinum and the coronary arteries, including calcified  atherosclerotic plaque in the left main, left anterior descending, left circumflex and right coronary arteries. Mediastinum/Nodes: No pathologically enlarged mediastinal or hilar lymph nodes. Esophagus is unremarkable in appearance. No axillary lymphadenopathy. Lungs/Pleura: Trace bilateral pleural effusions lying dependently. There is a background of ground-glass attenuation and scattered areas of mild interlobular septal thickening, suspicious for a background of mild interstitial pulmonary edema. 7 mm ground-glass attenuation nodule in the right upper lobe (axial image 47 of series 6) and several other smaller ground-glass attenuation nodules are noted, but nonspecific in the setting of presumed edema. No other larger more suspicious appearing pulmonary nodules or masses are noted. No confluent consolidative airspace disease. Upper Abdomen: Reflux of contrast material into the IVC and hepatic veins. Musculoskeletal: There are no aggressive appearing lytic or blastic lesions noted in the visualized portions of the skeleton. Review of the MIP images confirms the above findings. IMPRESSION: 1. No evidence of pulmonary embolism. 2. Cardiomegaly with evidence of congestive heart failure, including interstitial pulmonary edema and trace bilateral pleural effusions. 3. Ground-glass attenuation nodules measuring up to 7 mm in the right upper lobe. These are nonspecific, potentially related to underlying edema. Non-contrast chest CT at 3-6 months is recommended. If nodules persist, subsequent management will be based upon the most suspicious nodule(s). This recommendation follows the consensus statement: Guidelines for Management of Incidental Pulmonary Nodules Detected on CT Images: From the Fleischner Society 2017; Radiology 2017; 284:228-243. 4. Aortic atherosclerosis, in addition to left main and 3 vessel coronary artery disease. Please note that although the presence of coronary artery calcium documents the presence  of coronary artery disease, the severity of this disease and any potential stenosis cannot be assessed on this  non-gated CT examination. Assessment for potential risk factor modification, dietary therapy or pharmacologic therapy may be warranted, if clinically indicated. Aortic Atherosclerosis (ICD10-I70.0). Electronically Signed   By: Vinnie Langton M.D.   On: 03/28/2021 08:10    Procedures Procedures   Medications Ordered in ED Medications  diltiazem (CARDIZEM) 1 mg/mL load via infusion 20 mg (has no administration in time range)    And  diltiazem (CARDIZEM) 125 mg in dextrose 5% 125 mL (1 mg/mL) infusion (has no administration in time range)  metoprolol tartrate (LOPRESSOR) injection 5 mg (5 mg Intravenous Given 03/28/21 0726)  furosemide (LASIX) injection 20 mg (20 mg Intravenous Given 03/28/21 0724)  iohexol (OMNIPAQUE) 350 MG/ML injection 80 mL (80 mLs Intravenous Contrast Given 03/28/21 0804)    ED Course  I have reviewed the triage vital signs and the nursing notes.  Pertinent labs & imaging results that were available during my care of the patient were reviewed by me and considered in my medical decision making (see chart for details).    MDM Rules/Calculators/A&P                          CHA2DS2/VAS Stroke Risk Points  Current as of 5 minutes ago     6 >= 2 Points: High Risk  1 - 1.99 Points: Medium Risk  0 Points: Low Risk    Last Change: N/A      Details    This score determines the patient's risk of having a stroke if the  patient has atrial fibrillation.       Points Metrics  1 Has Congestive Heart Failure:  Yes    Current as of 5 minutes ago  0 Has Vascular Disease:  No    Current as of 5 minutes ago  1 Has Hypertension:  Yes    Current as of 5 minutes ago  0 Age:  40    Current as of 5 minutes ago  1 Has Diabetes:  Yes    Current as of 5 minutes ago  2 Had Stroke:  Yes  Had TIA:  No  Had Thromboembolism:  No    Current as of 5 minutes ago  1 Female:  Yes     Current as of 5 minutes ago       Pt's troponins are elevated.  They are flat.  I think this is from [place].  She has not had any cp.  Pt's last ECHO in 2020 showed a nl EF.  I suspect this is not the case any more.  CT chest showed CHF.  HR did not improve much with 5 mg lopressor.  She is started on a cardizem drip.  CRITICAL CARE Performed by: Isla Pence   Total critical care time: 30 minutes  Critical care time was exclusive of separately billable procedures and treating other patients.  Critical care was necessary to treat or prevent imminent or life-threatening deterioration.  Critical care was time spent personally by me on the following activities: development of treatment plan with patient and/or surrogate as well as nursing, discussions with consultants, evaluation of patient's response to treatment, examination of patient, obtaining history from patient or surrogate, ordering and performing treatments and interventions, ordering and review of laboratory studies, ordering and review of radiographic studies, pulse oximetry and re-evaluation of patient's condition.     Pt d/w Dr. [place] (triad) for admission.  Final Clinical Impression(s) / ED Diagnoses Final diagnoses:  Acute congestive  heart failure, unspecified heart failure type ([place])  Atrial fibrillation with RVR ([place])  Elevated troponin    Rx / DC Orders ED Discharge Orders     None        Isla Pence, MD 03/28/21 [id number]

## 2021-03-29 ENCOUNTER — Other Ambulatory Visit (HOSPITAL_COMMUNITY): Payer: Self-pay

## 2021-03-29 DIAGNOSIS — I5041 Acute combined systolic (congestive) and diastolic (congestive) heart failure: Secondary | ICD-10-CM | POA: Diagnosis not present

## 2021-03-29 DIAGNOSIS — I4891 Unspecified atrial fibrillation: Secondary | ICD-10-CM | POA: Diagnosis not present

## 2021-03-29 LAB — BASIC METABOLIC PANEL
Anion gap: 11 (ref 5–15)
Anion gap: 9 (ref 5–15)
BUN: 20 mg/dL (ref 6–20)
BUN: 16 mg/dL (ref 6–20)
CO2: 18 mmol/L — ABNORMAL LOW (ref 22–32)
CO2: 21 mmol/L — ABNORMAL LOW (ref 22–32)
Calcium: 8.9 mg/dL (ref 8.9–10.3)
Calcium: 8.9 mg/dL (ref 8.9–10.3)
Chloride: 107 mmol/L (ref 98–111)
Chloride: 106 mmol/L (ref 98–111)
Creatinine, Ser: 1.53 mg/dL — ABNORMAL HIGH (ref 0.44–1.00)
Creatinine, Ser: 1.21 mg/dL — ABNORMAL HIGH (ref 0.44–1.00)
GFR, Estimated: 40 mL/min — ABNORMAL LOW (ref >60)
GFR, Estimated: 53 mL/min — ABNORMAL LOW (ref >60)
Glucose, Bld: 116 mg/dL — ABNORMAL HIGH (ref 70–99)
Glucose, Bld: 142 mg/dL — ABNORMAL HIGH (ref 70–99)
Potassium: 5.2 mmol/L — ABNORMAL HIGH (ref 3.5–5.1)
Potassium: 3.9 mmol/L (ref 3.5–5.1)
Sodium: 136 mmol/L (ref 135–145)
Sodium: 136 mmol/L (ref 135–145)

## 2021-03-29 LAB — PHOSPHORUS: Phosphorus: 4.6 mg/dL (ref 2.5–4.6)

## 2021-03-29 LAB — LIPID PANEL
Cholesterol: 111 mg/dL (ref 0–200)
HDL: 18 mg/dL — ABNORMAL LOW (ref >40)
LDL Cholesterol: 79 mg/dL (ref 0–99)
Total CHOL/HDL Ratio: 6.2 RATIO
Triglycerides: 71 mg/dL (ref <150)
VLDL: 14 mg/dL (ref 0–40)

## 2021-03-29 LAB — MAGNESIUM: Magnesium: 2.1 mg/dL (ref 1.7–2.4)

## 2021-03-29 MED ORDER — FUROSEMIDE 40 MG PO TABS
40.0000 mg | ORAL_TABLET | Freq: Every day | ORAL | Status: DC
  Administered 2021-03-29 – 2021-04-04 (×7): 40 mg via ORAL
  Filled 2021-03-29 (×7): qty 1

## 2021-03-29 MED ORDER — METOPROLOL TARTRATE 50 MG PO TABS
50.0000 mg | ORAL_TABLET | Freq: Two times a day (BID) | ORAL | Status: DC
  Administered 2021-03-29 – 2021-03-30 (×2): 50 mg via ORAL
  Filled 2021-03-29 (×2): qty 1

## 2021-03-29 NOTE — Plan of Care (Signed)
  Problem: Education: Goal: Knowledge of General Education information will improve Description Including pain rating scale, medication(s)/side effects and non-pharmacologic comfort measures Outcome: Progressing   

## 2021-03-29 NOTE — Progress Notes (Signed)
PROGRESS NOTE    [name]  ZOX:[id number] DOB: 02/10/1966 DOA: 03/28/2021 PCP: [name], MD   Brief Narrative: 55 year old with past medical history significant for A. fib on anticoagulation, CVA/TIA, hypertension presents complaining of worsening shortness of breath, worse over the last 2 days prior to admission.  She reports a chronic palpitation from her A. fib.  She noticed worsening palpitation.  She reports 10 pounds weight gain and lower extremity edema.  Evaluation in the ED: Patient was found to be afebrile, heart rate 130s A. fib, blood pressure 112/87.  Chest x-ray with pulmonary vascular congestion, BNP 325.  CTA chest was negative for PE.  7 mm nodule in the right upper lobe.  Patient was admitted for heart failure exacerbation and A. fib with RVR.     Assessment & Plan:   Principal Problem:   Atrial fibrillation with RVR (HCC) Active Problems:   Essential hypertension   History of CVA (cerebrovascular accident)   Hyperlipidemia LDL goal <70   Acute on chronic diastolic CHF (congestive heart failure) (HCC)   Longstanding persistent atrial fibrillation (HCC)   Elevated troponin    Acute Systolic and diastolic Heart failure Exacerbation:  -Patient presented with worsening shortness of breath, A. fib with RVR, chest x-ray with pulmonary edema, elevated BNP. -2D echo showed severe tricuspid valve regurgitation and moderate mitral valve regurgitation.  ejection fraction decrease to 25 to 30% -Received IV Lasix in the ED. -Transition to oral Lasix 40 mg daily. -Follow cardiology recommendation in regards tricuspid or mitral valve regurgitation. -Cardiology considering ischemic evaluation out patient. No candidate for cardiac CT while still in A fib.  -cardiology planning start entresto  tomororw.   A fib; Continue with metoprolol. Cardizem discontinued due to new lower ejection fraction Continue with Xarelto   HTN; Continue with metoprolol.  Hold HCTZ,  now on lasix.   DM type 2:  On diet.  Not currently taking metformin.  HbA1c: 6.7  OSA: Continue with CPAP.   HLD;  Continue with Lipitor.   Hx CVA, TIA: Continue with statins,   Elevation troponin; cardiology following.     Estimated body mass index is 35.38 kg/m as calculated from the following:   Height as of this encounter: 5\' 4"  (1.626 m).   Weight as of this encounter: 93.5 kg.   DVT prophylaxis: xarelto Code Status: Full code Family Communication: care discussed with patient Disposition Plan:  Status is: Inpatient  Remains inpatient appropriate because:IV treatments appropriate due to intensity of illness or inability to take PO  Dispo: The patient is from: Home              Anticipated d/c is to: Home              Patient currently is not medically stable to d/c.   Difficult to place patient No        Consultants:  Cardiology   Procedures:  ECHO; EF 25 %  Antimicrobials:    Subjective: She is breathing better. Denies chest pain   Objective: Vitals:   03/28/21 2300 03/29/21 0407 03/29/21 0705 03/29/21 0727  BP: 99/86 133/80  (!) 135/98  Pulse: 70 77  (!) 117  Resp: 17 20  (!) 23  Temp: 98 F (36.7 C) 97.6 F (36.4 C)  97.8 F (36.6 C)  TempSrc: Oral Oral  Oral  SpO2: 99% 98%  91%  Weight:   93.5 kg   Height:        Intake/Output Summary (Last  24 hours) at 03/29/2021 0731 Last data filed at 03/28/2021 2134 Gross per 24 hour  Intake 795.01 ml  Output 1800 ml  Net -1004.99 ml   Filed Weights   03/28/21 0525 03/28/21 1041 03/29/21 0705  Weight: 100.7 kg 83.5 kg 93.5 kg    Examination:  General exam: Appears calm and comfortable  Respiratory system: BL crackles.  Cardiovascular system: S1 & S2 heard, IRR Gastrointestinal system: Abdomen is nondistended, soft and nontender. No organomegaly or masses felt. Normal bowel sounds heard. Central nervous system: Alert and oriented. No focal neurological deficits. Extremities: Symmetric  5 x 5 power. Skin: No rashes, lesions or ulcers   Data Reviewed: I have personally reviewed following labs and imaging studies  CBC: Recent Labs  Lab 03/28/21 0529  WBC 7.8  NEUTROABS 5.3  HGB 12.2  HCT 40.5  MCV 84.2  PLT 400   Basic Metabolic Panel: Recent Labs  Lab 03/28/21 0529 03/28/21 0916  NA 138  --   K 3.5  --   CL 108  --   CO2 21*  --   GLUCOSE 146*  --   BUN 12  --   CREATININE 0.92  --   CALCIUM 8.8*  --   MG  --  2.1   GFR: Estimated Creatinine Clearance: 75.7 mL/min (by C-G formula based on SCr of 0.92 mg/dL). Liver Function Tests: No results for input(s): AST, ALT, ALKPHOS, BILITOT, PROT, ALBUMIN in the last 168 hours. No results for input(s): LIPASE, AMYLASE in the last 168 hours. No results for input(s): AMMONIA in the last 168 hours. Coagulation Profile: No results for input(s): INR, PROTIME in the last 168 hours. Cardiac Enzymes: No results for input(s): CKTOTAL, CKMB, CKMBINDEX, TROPONINI in the last 168 hours. BNP (last 3 results) No results for input(s): PROBNP in the last 8760 hours. HbA1C: Recent Labs    03/28/21 1129  HGBA1C 6.7*   CBG: No results for input(s): GLUCAP in the last 168 hours. Lipid Profile: Recent Labs    03/29/21 0036  CHOL 111  HDL 18*  LDLCALC 79  TRIG 71  CHOLHDL 6.2   Thyroid Function Tests: Recent Labs    03/28/21 0917  TSH 2.122   Anemia Panel: No results for input(s): VITAMINB12, FOLATE, FERRITIN, TIBC, IRON, RETICCTPCT in the last 72 hours. Sepsis Labs: No results for input(s): PROCALCITON, LATICACIDVEN in the last 168 hours.  Recent Results (from the past 240 hour(s))  Resp Panel by RT-PCR (Flu A&B, Covid) Nasopharyngeal Swab     Status: None   Collection Time: 03/28/21  7:48 AM   Specimen: Nasopharyngeal Swab; Nasopharyngeal(NP) swabs in vial transport medium  Result Value Ref Range Status   SARS Coronavirus 2 by RT PCR NEGATIVE NEGATIVE Final    Comment: (NOTE) SARS-CoV-2 target  nucleic acids are NOT DETECTED.  The SARS-CoV-2 RNA is generally detectable in upper respiratory specimens during the acute phase of infection. The lowest concentration of SARS-CoV-2 viral copies this assay can detect is 138 copies/mL. A negative result does not preclude SARS-Cov-2 infection and should not be used as the sole basis for treatment or other patient management decisions. A negative result may occur with  improper specimen collection/handling, submission of specimen other than nasopharyngeal swab, presence of viral mutation(s) within the areas targeted by this assay, and inadequate number of viral copies(<138 copies/mL). A negative result must be combined with clinical observations, patient history, and epidemiological information. The expected result is Negative.  Fact Sheet for Patients:  EntrepreneurPulse.com.au  Fact Sheet for Healthcare Providers:  IncredibleEmployment.be  This test is no t yet approved or cleared by the [place] FDA and  has been authorized for detection and/or diagnosis of SARS-CoV-2 by FDA under an Emergency Use Authorization (EUA). This EUA will remain  in effect (meaning this test can be used) for the duration of the COVID-19 declaration under Section 564(b)(1) of the Act, 21 U.S.C.section 360bbb-3(b)(1), unless the authorization is terminated  or revoked sooner.       Influenza A by PCR NEGATIVE NEGATIVE Final   Influenza B by PCR NEGATIVE NEGATIVE Final    Comment: (NOTE) The Xpert Xpress SARS-CoV-2/FLU/RSV plus assay is intended as an aid in the diagnosis of influenza from Nasopharyngeal swab specimens and should not be used as a sole basis for treatment. Nasal washings and aspirates are unacceptable for Xpert Xpress SARS-CoV-2/FLU/RSV testing.  Fact Sheet for Patients: EntrepreneurPulse.com.au  Fact Sheet for Healthcare  Providers: IncredibleEmployment.be  This test is not yet approved or cleared by the [place] FDA and has been authorized for detection and/or diagnosis of SARS-CoV-2 by FDA under an Emergency Use Authorization (EUA). This EUA will remain in effect (meaning this test can be used) for the duration of the COVID-19 declaration under Section 564(b)(1) of the Act, 21 U.S.C. section 360bbb-3(b)(1), unless the authorization is terminated or revoked.  Performed at [facility], [place] [address], [place], [place] [address]   MRSA Next Gen by PCR, Nasal     Status: None   Collection Time: 03/28/21 11:05 AM   Specimen: Nasal Mucosa; Nasal Swab  Result Value Ref Range Status   MRSA by PCR Next Gen NOT DETECTED NOT DETECTED Final    Comment: (NOTE) The GeneXpert MRSA Assay (FDA approved for NASAL specimens only), is one component of a comprehensive MRSA colonization surveillance program. It is not intended to diagnose MRSA infection nor to guide or monitor treatment for MRSA infections. Test performance is not FDA approved in patients less than 1 years old. Performed at [facility], [place] [address], [place], [place] [address]          Radiology Studies: DG Chest 2 View  Result Date: 03/28/2021 CLINICAL DATA:  55 year old female with shortness of breath and lower extremity swelling. EXAM: CHEST - 2 VIEW COMPARISON:  Chest radiograph 04/10/2020 and earlier. FINDINGS: Chronic cardiomegaly. Stable cardiac size and mediastinal contours. Visualized tracheal air column is within normal limits. Lung volumes and vascularity appear to be at baseline. No pneumothorax, pleural effusion or acute pulmonary opacity. No acute osseous abnormality identified. Negative visible bowel gas pattern. IMPRESSION: Cardiomegaly and baseline pulmonary vascular congestion without overt edema. Electronically Signed   By: Genevie Ann M.D.   On: 03/28/2021 06:09   CT Angio Chest PE W and/or Wo  Contrast  Result Date: 03/28/2021 CLINICAL DATA:  55 year old female with history of shortness of breath for the past 2 days. Evaluate for pulmonary embolism. EXAM: CT ANGIOGRAPHY CHEST WITH CONTRAST TECHNIQUE: Multidetector CT imaging of the chest was performed using the standard protocol during bolus administration of intravenous contrast. Multiplanar CT image reconstructions and MIPs were obtained to evaluate the vascular anatomy. CONTRAST:  65mL OMNIPAQUE IOHEXOL 350 MG/ML SOLN COMPARISON:  Chest CT 03/22/2014. FINDINGS: Cardiovascular: No filling defects within the pulmonary arterial tree to suggest pulmonary embolism. Heart size is enlarged with severe right atrial dilatation. There is no significant pericardial fluid, thickening or pericardial calcification. There is aortic atherosclerosis, as well as atherosclerosis of the great  vessels of the mediastinum and the coronary arteries, including calcified atherosclerotic plaque in the left main, left anterior descending, left circumflex and right coronary arteries. Mediastinum/Nodes: No pathologically enlarged mediastinal or hilar lymph nodes. Esophagus is unremarkable in appearance. No axillary lymphadenopathy. Lungs/Pleura: Trace bilateral pleural effusions lying dependently. There is a background of ground-glass attenuation and scattered areas of mild interlobular septal thickening, suspicious for a background of mild interstitial pulmonary edema. 7 mm ground-glass attenuation nodule in the right upper lobe (axial image 47 of series 6) and several other smaller ground-glass attenuation nodules are noted, but nonspecific in the setting of presumed edema. No other larger more suspicious appearing pulmonary nodules or masses are noted. No confluent consolidative airspace disease. Upper Abdomen: Reflux of contrast material into the IVC and hepatic veins. Musculoskeletal: There are no aggressive appearing lytic or blastic lesions noted in the visualized portions  of the skeleton. Review of the MIP images confirms the above findings. IMPRESSION: 1. No evidence of pulmonary embolism. 2. Cardiomegaly with evidence of congestive heart failure, including interstitial pulmonary edema and trace bilateral pleural effusions. 3. Ground-glass attenuation nodules measuring up to 7 mm in the right upper lobe. These are nonspecific, potentially related to underlying edema. Non-contrast chest CT at 3-6 months is recommended. If nodules persist, subsequent management will be based upon the most suspicious nodule(s). This recommendation follows the consensus statement: Guidelines for Management of Incidental Pulmonary Nodules Detected on CT Images: From the Fleischner Society 2017; Radiology 2017; 284:228-243. 4. Aortic atherosclerosis, in addition to left main and 3 vessel coronary artery disease. Please note that although the presence of coronary artery calcium documents the presence of coronary artery disease, the severity of this disease and any potential stenosis cannot be assessed on this non-gated CT examination. Assessment for potential risk factor modification, dietary therapy or pharmacologic therapy may be warranted, if clinically indicated. Aortic Atherosclerosis (ICD10-I70.0). Electronically Signed   By: Vinnie Langton M.D.   On: 03/28/2021 08:10   ECHOCARDIOGRAM COMPLETE  Result Date: 03/28/2021    ECHOCARDIOGRAM REPORT   Patient Name:   Awilda JAZZLYNN RAWE Date of Exam: 03/28/2021 Medical Rec #:  [id number]     Height:       64.0 in Accession #:    [id number]    Weight:       222.0 lb Date of Birth:  [birth year]      BSA:          2.045 m Patient Age:    35 years      BP:           86/55 mmHg Patient Gender: F             HR:           67 bpm. Exam Location:  Inpatient Procedure: 2D Echo, Cardiac Doppler, Color Doppler and Intracardiac            Opacification Agent Indications:     CHF-Acute Diastolic I34.74  History:         Patient has prior history of Echocardiogram  examinations, most                  recent 05/09/2019. Stroke and TIA, Arrythmias:NSVT,                  Signs/Symptoms:Murmur; Risk Factors:Hypertension.  Sonographer:     Darlina Sicilian RDCS Referring Phys:  [id number] Reeder Diagnosing Phys: Loralie Champagne MD IMPRESSIONS  1. Left ventricular ejection fraction, by estimation,  is 25 to 30%. The left ventricle has severely decreased function. The left ventricle demonstrates global hypokinesis. There is mild left ventricular hypertrophy. Left ventricular diastolic parameters  are indeterminate.  2. Right ventricular systolic function is mildly reduced. The right ventricular size is moderately enlarged. There is normal pulmonary artery systolic pressure. The estimated right ventricular systolic pressure is 87.8 mmHg.  3. Left atrial size was mildly dilated.  4. Right atrial size was moderately dilated.  5. The mitral valve is normal in structure. Moderate mitral valve regurgitation. No evidence of mitral stenosis.  6. Tricuspid valve regurgitation is severe. Suspect functional from annular dilatation.  7. The aortic valve is tricuspid. Aortic valve regurgitation is mild. No aortic stenosis is present.  8. The inferior vena cava is dilated in size with >50% respiratory variability, suggesting right atrial pressure of 8 mmHg.  9. The patient was in atrial fibrillation. 10. Significant change in LV and RV function compared to prior echo. FINDINGS  Left Ventricle: Left ventricular ejection fraction, by estimation, is 25 to 30%. The left ventricle has severely decreased function. The left ventricle demonstrates global hypokinesis. Definity contrast agent was given IV to delineate the left ventricular endocardial borders. The left ventricular internal cavity size was normal in size. There is mild left ventricular hypertrophy. Left ventricular diastolic parameters are indeterminate. Right Ventricle: The right ventricular size is moderately enlarged. No increase in right  ventricular wall thickness. Right ventricular systolic function is mildly reduced. There is normal pulmonary artery systolic pressure. The tricuspid regurgitant velocity is 1.53 m/s, and with an assumed right atrial pressure of 8 mmHg, the estimated right ventricular systolic pressure is 67.6 mmHg. Left Atrium: Left atrial size was mildly dilated. Right Atrium: Right atrial size was moderately dilated. Pericardium: Trivial pericardial effusion is present. Mitral Valve: The mitral valve is normal in structure. Moderate mitral valve regurgitation. No evidence of mitral valve stenosis. Tricuspid Valve: The tricuspid valve is normal in structure. Tricuspid valve regurgitation is severe. Aortic Valve: The aortic valve is tricuspid. Aortic valve regurgitation is mild. Aortic regurgitation PHT measures 667 msec. No aortic stenosis is present. Pulmonic Valve: The pulmonic valve was normal in structure. Pulmonic valve regurgitation is not visualized. Aorta: The aortic root is normal in size and structure. Venous: The inferior vena cava is dilated in size with greater than 50% respiratory variability, suggesting right atrial pressure of 8 mmHg. IAS/Shunts: No atrial level shunt detected by color flow Doppler.  LEFT VENTRICLE PLAX 2D LVIDd:         4.20 cm LVIDs:         3.30 cm LV PW:         1.30 cm LV IVS:        1.50 cm LVOT diam:     1.80 cm LV SV:         19 LV SV Index:   9 LVOT Area:     2.54 cm  LV Volumes (MOD) LV vol d, MOD A2C: 81.7 ml LV vol d, MOD A4C: 62.0 ml LV vol s, MOD A2C: 65.6 ml LV vol s, MOD A4C: 53.4 ml LV SV MOD A2C:     16.1 ml LV SV MOD A4C:     62.0 ml LV SV MOD BP:      12.0 ml RIGHT VENTRICLE RV S prime:     7.12 cm/s TAPSE (M-mode): 0.9 cm LEFT ATRIUM           Index  RIGHT ATRIUM           Index LA diam:      3.40 cm 1.66 cm/m  RA Area:     16.20 cm LA Vol (A2C): 32.9 ml 16.09 ml/m RA Volume:   38.60 ml  18.87 ml/m  AORTIC VALVE LVOT Vmax:   49.80 cm/s LVOT Vmean:  30.950 cm/s LVOT  VTI:    0.076 m AI PHT:      667 msec  AORTA Ao Root diam: 2.80 cm Ao Asc diam:  2.70 cm MITRAL VALVE                 TRICUSPID VALVE MV Area (PHT): 5.75 cm      TR Peak grad:   9.4 mmHg MV Decel Time: 132 msec      TR Vmax:        153.00 cm/s MR Peak grad:    38.4 mmHg MR Mean grad:    24.0 mmHg   SHUNTS MR Vmax:         310.00 cm/s Systemic VTI:  0.08 m MR Vmean:        230.0 cm/s  Systemic Diam: 1.80 cm MR PISA:         1.57 cm MR PISA Eff ROA: 17 mm MR PISA Radius:  0.50 cm MV E velocity: 81.65 cm/s Loralie Champagne MD Electronically signed by Loralie Champagne MD Signature Date/Time: 03/28/2021/2:30:39 PM    Final (Updated)         Scheduled Meds:  atorvastatin  80 mg Oral q1800   gabapentin  300 mg Oral BID   metoprolol tartrate  25 mg Oral BID   rivaroxaban  20 mg Oral Q supper   sodium chloride flush  3 mL Intravenous Q12H   Continuous Infusions:  sodium chloride       LOS: 1 day    Time spent: 35 minutes.     Elmarie Shiley, MD [facility]   If 7PM-7AM, please contact night-coverage [email or web address]  03/29/2021, 7:31 AM

## 2021-03-29 NOTE — Progress Notes (Signed)
Heart rate goes up to 130's  briefly on slight movement. Asymptomatic. Continue to monitor.

## 2021-03-29 NOTE — Progress Notes (Signed)
Progress Note  Patient Name: [name] Date of Encounter: 03/29/2021  [facility] Cardiologist: Sanda Klein, MD   Subjective   Patient was evaluated at the bedside laying comfortably in bed. States she is feeling better. Shortness of breath has improved. Denies any chest pain or palpitations. States she did not sleep well due to the cold air from the CPAP.  Inpatient Medications    Scheduled Meds:  atorvastatin  80 mg Oral q1800   gabapentin  300 mg Oral BID   metoprolol tartrate  25 mg Oral BID   rivaroxaban  20 mg Oral Q supper   sodium chloride flush  3 mL Intravenous Q12H   Continuous Infusions:  sodium chloride     PRN Meds: sodium chloride, acetaminophen, cyclobenzaprine, ondansetron (ZOFRAN) IV, sodium chloride flush   Vital Signs    Vitals:   03/28/21 2300 03/29/21 0407 03/29/21 0705 03/29/21 0727  BP: 99/86 133/80  (!) 135/98  Pulse: 70 77  (!) 117  Resp: 17 20  (!) 23  Temp: 98 F (36.7 C) 97.6 F (36.4 C)  97.8 F (36.6 C)  TempSrc: Oral Oral  Oral  SpO2: 99% 98%  91%  Weight:   93.5 kg   Height:        Intake/Output Summary (Last 24 hours) at 03/29/2021 0848 Last data filed at 03/28/2021 2134 Gross per 24 hour  Intake 795.01 ml  Output 1800 ml  Net -1004.99 ml   Last 3 Weights 03/29/2021 03/28/2021 03/28/2021  Weight (lbs) 206 lb 2.1 oz 184 lb 1.4 oz 222 lb  Weight (kg) 93.5 kg 83.5 kg 100.699 kg      Telemetry    A. fib with HR 100s to 120s- Personally Reviewed  ECG    No new EKG today  Physical Exam   GEN: Pleasant, obese middle-aged woman laying in bed.  No acute distress.   Neck: No JVD Cardiac: Tachycardic. Irregular irregular, no murmurs, rubs, or gallops.  Respiratory: Clear to auscultation bilaterally. Bibasilar rales.  No wheezing. GI: Soft, nontender, non-distended.  Normal bowel sounds. MS: No edema; No deformity. Neuro: No focal neurodeficits. Psych: Normal affect   Labs    High Sensitivity Troponin:   Recent  Labs  Lab 03/28/21 0529 03/28/21 0727  TROPONINIHS 103* 107*      Chemistry Recent Labs  Lab 03/28/21 0529 03/29/21 0541  NA 138 136  K 3.5 5.2*  CL 108 107  CO2 21* 18*  GLUCOSE 146* 116*  BUN 12 20  CREATININE 0.92 1.53*  CALCIUM 8.8* 8.9  GFRNONAA >60 40*  ANIONGAP 9 11     Hematology Recent Labs  Lab 03/28/21 0529  WBC 7.8  RBC 4.81  HGB 12.2  HCT 40.5  MCV 84.2  MCH 25.4*  MCHC 30.1  RDW 15.6*  PLT 213    BNP Recent Labs  Lab 03/28/21 0529  BNP 325.5*     DDimer No results for input(s): DDIMER in the last 168 hours.   Radiology    DG Chest 2 View  Result Date: 03/28/2021 CLINICAL DATA:  55 year old female with shortness of breath and lower extremity swelling. EXAM: CHEST - 2 VIEW COMPARISON:  Chest radiograph 04/10/2020 and earlier. FINDINGS: Chronic cardiomegaly. Stable cardiac size and mediastinal contours. Visualized tracheal air column is within normal limits. Lung volumes and vascularity appear to be at baseline. No pneumothorax, pleural effusion or acute pulmonary opacity. No acute osseous abnormality identified. Negative visible bowel gas pattern. IMPRESSION: Cardiomegaly and  baseline pulmonary vascular congestion without overt edema. Electronically Signed   By: Genevie Ann M.D.   On: 03/28/2021 06:09   CT Angio Chest PE W and/or Wo Contrast  Result Date: 03/28/2021 CLINICAL DATA:  55 year old female with history of shortness of breath for the past 2 days. Evaluate for pulmonary embolism. EXAM: CT ANGIOGRAPHY CHEST WITH CONTRAST TECHNIQUE: Multidetector CT imaging of the chest was performed using the standard protocol during bolus administration of intravenous contrast. Multiplanar CT image reconstructions and MIPs were obtained to evaluate the vascular anatomy. CONTRAST:  56mL OMNIPAQUE IOHEXOL 350 MG/ML SOLN COMPARISON:  Chest CT 03/22/2014. FINDINGS: Cardiovascular: No filling defects within the pulmonary arterial tree to suggest pulmonary embolism.  Heart size is enlarged with severe right atrial dilatation. There is no significant pericardial fluid, thickening or pericardial calcification. There is aortic atherosclerosis, as well as atherosclerosis of the great vessels of the mediastinum and the coronary arteries, including calcified atherosclerotic plaque in the left main, left anterior descending, left circumflex and right coronary arteries. Mediastinum/Nodes: No pathologically enlarged mediastinal or hilar lymph nodes. Esophagus is unremarkable in appearance. No axillary lymphadenopathy. Lungs/Pleura: Trace bilateral pleural effusions lying dependently. There is a background of ground-glass attenuation and scattered areas of mild interlobular septal thickening, suspicious for a background of mild interstitial pulmonary edema. 7 mm ground-glass attenuation nodule in the right upper lobe (axial image 47 of series 6) and several other smaller ground-glass attenuation nodules are noted, but nonspecific in the setting of presumed edema. No other larger more suspicious appearing pulmonary nodules or masses are noted. No confluent consolidative airspace disease. Upper Abdomen: Reflux of contrast material into the IVC and hepatic veins. Musculoskeletal: There are no aggressive appearing lytic or blastic lesions noted in the visualized portions of the skeleton. Review of the MIP images confirms the above findings. IMPRESSION: 1. No evidence of pulmonary embolism. 2. Cardiomegaly with evidence of congestive heart failure, including interstitial pulmonary edema and trace bilateral pleural effusions. 3. Ground-glass attenuation nodules measuring up to 7 mm in the right upper lobe. These are nonspecific, potentially related to underlying edema. Non-contrast chest CT at 3-6 months is recommended. If nodules persist, subsequent management will be based upon the most suspicious nodule(s). This recommendation follows the consensus statement: Guidelines for Management of  Incidental Pulmonary Nodules Detected on CT Images: From the Fleischner Society 2017; Radiology 2017; 284:228-243. 4. Aortic atherosclerosis, in addition to left main and 3 vessel coronary artery disease. Please note that although the presence of coronary artery calcium documents the presence of coronary artery disease, the severity of this disease and any potential stenosis cannot be assessed on this non-gated CT examination. Assessment for potential risk factor modification, dietary therapy or pharmacologic therapy may be warranted, if clinically indicated. Aortic Atherosclerosis (ICD10-I70.0). Electronically Signed   By: Vinnie Langton M.D.   On: 03/28/2021 08:10   ECHOCARDIOGRAM COMPLETE  Result Date: 03/28/2021    ECHOCARDIOGRAM REPORT   Patient Name:   [name] Date of Exam: 03/28/2021 Medical Rec #:  [id number]     Height:       64.0 in Accession #:    [id number]    Weight:       222.0 lb Date of Birth:  July 04, 1966      BSA:          2.045 m Patient Age:    31 years      BP:           86/55 mmHg Patient Gender: F  HR:           67 bpm. Exam Location:  Inpatient Procedure: 2D Echo, Cardiac Doppler, Color Doppler and Intracardiac            Opacification Agent Indications:     CHF-Acute Diastolic F68.12  History:         Patient has prior history of Echocardiogram examinations, most                  recent 05/09/2019. Stroke and TIA, Arrythmias:NSVT,                  Signs/Symptoms:Murmur; Risk Factors:Hypertension.  Sonographer:     Darlina Sicilian RDCS Referring Phys:  [id number] [place] Diagnosing Phys: Loralie Champagne MD IMPRESSIONS  1. Left ventricular ejection fraction, by estimation, is 25 to 30%. The left ventricle has severely decreased function. The left ventricle demonstrates global hypokinesis. There is mild left ventricular hypertrophy. Left ventricular diastolic parameters  are indeterminate.  2. Right ventricular systolic function is mildly reduced. The right ventricular size  is moderately enlarged. There is normal pulmonary artery systolic pressure. The estimated right ventricular systolic pressure is 74.9 mmHg.  3. Left atrial size was mildly dilated.  4. Right atrial size was moderately dilated.  5. The mitral valve is normal in structure. Moderate mitral valve regurgitation. No evidence of mitral stenosis.  6. Tricuspid valve regurgitation is severe. Suspect functional from annular dilatation.  7. The aortic valve is tricuspid. Aortic valve regurgitation is mild. No aortic stenosis is present.  8. The inferior vena cava is dilated in size with >50% respiratory variability, suggesting right atrial pressure of 8 mmHg.  9. The patient was in atrial fibrillation. 10. Significant change in LV and RV function compared to prior echo. FINDINGS  Left Ventricle: Left ventricular ejection fraction, by estimation, is 25 to 30%. The left ventricle has severely decreased function. The left ventricle demonstrates global hypokinesis. Definity contrast agent was given IV to delineate the left ventricular endocardial borders. The left ventricular internal cavity size was normal in size. There is mild left ventricular hypertrophy. Left ventricular diastolic parameters are indeterminate. Right Ventricle: The right ventricular size is moderately enlarged. No increase in right ventricular wall thickness. Right ventricular systolic function is mildly reduced. There is normal pulmonary artery systolic pressure. The tricuspid regurgitant velocity is 1.53 m/s, and with an assumed right atrial pressure of 8 mmHg, the estimated right ventricular systolic pressure is 44.9 mmHg. Left Atrium: Left atrial size was mildly dilated. Right Atrium: Right atrial size was moderately dilated. Pericardium: Trivial pericardial effusion is present. Mitral Valve: The mitral valve is normal in structure. Moderate mitral valve regurgitation. No evidence of mitral valve stenosis. Tricuspid Valve: The tricuspid valve is normal in  structure. Tricuspid valve regurgitation is severe. Aortic Valve: The aortic valve is tricuspid. Aortic valve regurgitation is mild. Aortic regurgitation PHT measures 667 msec. No aortic stenosis is present. Pulmonic Valve: The pulmonic valve was normal in structure. Pulmonic valve regurgitation is not visualized. Aorta: The aortic root is normal in size and structure. Venous: The inferior vena cava is dilated in size with greater than 50% respiratory variability, suggesting right atrial pressure of 8 mmHg. IAS/Shunts: No atrial level shunt detected by color flow Doppler.  LEFT VENTRICLE PLAX 2D LVIDd:         4.20 cm LVIDs:         3.30 cm LV PW:         1.30 cm LV IVS:  1.50 cm LVOT diam:     1.80 cm LV SV:         19 LV SV Index:   9 LVOT Area:     2.54 cm  LV Volumes (MOD) LV vol d, MOD A2C: 81.7 ml LV vol d, MOD A4C: 62.0 ml LV vol s, MOD A2C: 65.6 ml LV vol s, MOD A4C: 53.4 ml LV SV MOD A2C:     16.1 ml LV SV MOD A4C:     62.0 ml LV SV MOD BP:      12.0 ml RIGHT VENTRICLE RV S prime:     7.12 cm/s TAPSE (M-mode): 0.9 cm LEFT ATRIUM           Index       RIGHT ATRIUM           Index LA diam:      3.40 cm 1.66 cm/m  RA Area:     16.20 cm LA Vol (A2C): 32.9 ml 16.09 ml/m RA Volume:   38.60 ml  18.87 ml/m  AORTIC VALVE LVOT Vmax:   49.80 cm/s LVOT Vmean:  30.950 cm/s LVOT VTI:    0.076 m AI PHT:      667 msec  AORTA Ao Root diam: 2.80 cm Ao Asc diam:  2.70 cm MITRAL VALVE                 TRICUSPID VALVE MV Area (PHT): 5.75 cm      TR Peak grad:   9.4 mmHg MV Decel Time: 132 msec      TR Vmax:        153.00 cm/s MR Peak grad:    38.4 mmHg MR Mean grad:    24.0 mmHg   SHUNTS MR Vmax:         310.00 cm/s Systemic VTI:  0.08 m MR Vmean:        230.0 cm/s  Systemic Diam: 1.80 cm MR PISA:         1.57 cm MR PISA Eff ROA: 17 mm MR PISA Radius:  0.50 cm MV E velocity: 81.65 cm/s Loralie Champagne MD Electronically signed by Loralie Champagne MD Signature Date/Time: 03/28/2021/2:30:39 PM    Final (Updated)      Cardiac Studies   No new studies today  Patient Profile     55 y.o. female with a hx of Afib on Xarelto, HFpEF, CVA, OSA (AHI of 72.9/h), HTN, and T2DM who is being seen 03/28/2021 for the evaluation of heart failure exacerbation and afib w/ RVR and found to have a newly reduced EF of 25-30%.     Assessment & Plan    HF with newly reduced EF: Previously HFpEF but repeat echo on admission showed EF down to 25-30%, global hypokinesis of LV, mild LVH, mild LAE, moderate LAE, moderate RV enlarged and severe TR. No significant UOP since she was in the ER.  Not significantly fluid overloaded on exam but continues to have bibasilar rales with O2 sat in the low 90s on room air.  Mild AKI today.  Resume diuresis today work on optimizing GDMT.  -- Start Lasix 40 mg daily -- Increase metoprolol to 50 mg BID -- Recommend starting Entresto tomorrow with improvement in kidney function and if BP allows. Can introduce spironolactone and Farxiga slowly. -- Strict I&Os, daily weight -- Continue to monitor kidney function, electrolytes -- Patient not a candidate for coronary CT while she is still in A. fib. We will consider other ischemic eval  in the outpatient.   Afib: Continues to be in A. fib and heart rate in the 100s to 120s overnight.  She denies any palpitations or chest pain. Diltiazem discontinued due to newly reduced EF.  We will increase her beta-blocker and monitor closely. --Increase metoprolol to 50 mg twice daily --Continue Xarelto 20 mg daily --Continue telemetry and monitor electrolytes   HTN: BP elevated overnight with SBP in the 130s to 150s.  We will start diuresis today and optimize antihypertensives and GDMT as able. -- Increase metoprolol to 50 mg BID -- Start Lasix 40 mg daily   HLD: Repeat lipid panel showed LDL 79, HDL of 18.  -- Continue atorvastatin 80 mg daily   Hx of CVA, TIA: TIA in 3007 and embolic stroke in 6226. No residual deficits.             -- Continue Xarelto  20 mg daily   T2DM: A1c 6.7.  Management per primary team   OSA: Tolerated CPAP overnight but states the cold air kept her from getting good sleep. --Continue CPAP at night    For questions or updates, please contact [place] Please consult [email or web address] for contact info under      Signed, Lacinda Axon, MD  03/29/2021, 8:14 AM    History and all data above reviewed.  Patient examined.  I agree with the findings as above.  Patient says that she feels fine.  She is not having any new chest pressure, neck or arm discomfort.  She is not having any new shortness of breath, PND.  She does not feel her heart racing.  Her blood pressure and her heart rate are both increased.  The patient exam reveals JFH:[id number]  ,  Lungs: Few basilar crackles  ,  Abd:  Positive bowel sounds, no rebound no guarding, Ext Trace edema  .  All available labs, radiology testing, previous records reviewed. Agree with documented assessment and plan. ACUTE CHF: Yesterday her heart rate was regular and controlled probably junctional.  He had considered a CT angiogram to rule out obstructive coronary disease which I think has a low pretest probability.  However, with the atrial fibrillation we will cancel that and she can get an outpatient perfusion study.  Today we will increase her beta-blocker.  If blood pressure allows in the morning she can be started on Entresto.  She can be converted to XL.  I would consider adding spironolactone this admission.    Jeneen Rinks Breunna Nordmann  1:09 PM  03/29/2021

## 2021-03-29 NOTE — Progress Notes (Signed)
Heart Failure Stewardship Pharmacist Progress Note   PCP: [name], MD PCP-Cardiologist: Sanda Klein, MD    HPI:  55 yo F with PMH of afib, CHF, CVA, OSA, HTN, and T2DM. She presented to the ED on 03/28/21 with shortness of breath, orthopnea, and LE edema. She was not taking her PRN furosemide PTA due to not knowing when to appropriately take it. CXR was done and showed cardiomegaly and baseline pulmonary vascular congestion without overt edema. An ECHO was done on 7/14 and LVEF is now reduced to 25-30% (down from 60-65% in 04/2019) with mild LVH and mildly reduced RV systolic function. Also found to be in afib RVR.   Current HF Medications: Furosemide 40 mg daily Metoprolol tartrate 50 mg BID  Prior to admission HF Medications: Furosemide 20 mg daily PRN Metoprolol tartrate 25 mg BID  Pertinent Lab Values: Serum creatinine 1.21, BUN 16, Potassium 3.9, Sodium 136, BNP 325.5, Magnesium 2.1   Vital Signs: Weight: 206 lbs (admission weight: 184 lbs) - unclear which is correct at this time Blood pressure: 130-150/90s  Heart rate: 110-120s   Medication Assistance / Insurance Benefits Check: Does the patient have prescription insurance? Yes Type of insurance plan: [place] [employer]  Outpatient Pharmacy:  Prior to admission outpatient pharmacy: [facility] Is the patient willing to use [facility] at discharge? Yes    Assessment: 1. Acute on chronic systolic CHF (EF 94-50%), pending ischemic evaluation. NYHA class III symptoms. - Continue furosemide 40 mg daily - Continue metoprolol tartrate 50 mg BID - consider transitioning to XL prior to discharge with new reduced EF - Consider starting Entresto tomorrow if SCr continues to improve - Consider starting spironolactone and Farxiga prior to discharge - Agree with discontinuing diltiazem with new reduced EF   Plan: 1) Medication changes recommended at this time: - Start Entresto tomorrow if SCr continues to  improve  2) Patient assistance: - Prior authorization required for Farxiga/Jardiance and Delene Loll - can complete these if added - 30-day free copay cards can be used if prior authorization approval is not granted until after discharge - HF TOC appt made for 7/22  3)  Education  - To be completed prior to discharge  Kerby Nora, PharmD, BCPS Heart Failure Stewardship Pharmacist Phone [phone]

## 2021-03-30 DIAGNOSIS — I5021 Acute systolic (congestive) heart failure: Secondary | ICD-10-CM

## 2021-03-30 DIAGNOSIS — I4811 Longstanding persistent atrial fibrillation: Secondary | ICD-10-CM | POA: Diagnosis not present

## 2021-03-30 DIAGNOSIS — I5041 Acute combined systolic (congestive) and diastolic (congestive) heart failure: Secondary | ICD-10-CM | POA: Diagnosis not present

## 2021-03-30 DIAGNOSIS — Z8673 Personal history of transient ischemic attack (TIA), and cerebral infarction without residual deficits: Secondary | ICD-10-CM | POA: Diagnosis not present

## 2021-03-30 DIAGNOSIS — I10 Essential (primary) hypertension: Secondary | ICD-10-CM

## 2021-03-30 DIAGNOSIS — I4891 Unspecified atrial fibrillation: Secondary | ICD-10-CM | POA: Diagnosis not present

## 2021-03-30 LAB — BASIC METABOLIC PANEL
Anion gap: 10 (ref 5–15)
BUN: 11 mg/dL (ref 6–20)
CO2: 23 mmol/L (ref 22–32)
Calcium: 8.4 mg/dL — ABNORMAL LOW (ref 8.9–10.3)
Chloride: 104 mmol/L (ref 98–111)
Creatinine, Ser: 0.9 mg/dL (ref 0.44–1.00)
GFR, Estimated: >60 mL/min (ref >60)
Glucose, Bld: 121 mg/dL — ABNORMAL HIGH (ref 70–99)
Potassium: 3.8 mmol/L (ref 3.5–5.1)
Sodium: 137 mmol/L (ref 135–145)

## 2021-03-30 MED ORDER — METOPROLOL TARTRATE 50 MG PO TABS
50.0000 mg | ORAL_TABLET | Freq: Once | ORAL | Status: AC
  Administered 2021-03-30: 50 mg via ORAL
  Filled 2021-03-30: qty 1

## 2021-03-30 MED ORDER — SPIRONOLACTONE 12.5 MG HALF TABLET
12.5000 mg | ORAL_TABLET | Freq: Every day | ORAL | Status: DC
  Administered 2021-03-30 – 2021-04-04 (×6): 12.5 mg via ORAL
  Filled 2021-03-30 (×7): qty 1

## 2021-03-30 MED ORDER — METOPROLOL TARTRATE 50 MG PO TABS
100.0000 mg | ORAL_TABLET | Freq: Two times a day (BID) | ORAL | Status: DC
  Administered 2021-03-30 – 2021-04-02 (×6): 100 mg via ORAL
  Filled 2021-03-30 (×6): qty 2

## 2021-03-30 NOTE — Progress Notes (Signed)
PROGRESS NOTE    [name]  RDE:[id number] DOB: [birth year] DOA: 03/28/2021 PCP: [name], MD   Brief Narrative: 55 year old with past medical history significant for A. fib on anticoagulation, CVA/TIA, hypertension presents complaining of worsening shortness of breath, worse over the last 2 days prior to admission.  She reports a chronic palpitation from her A. fib.  She noticed worsening palpitation.  She reports 10 pounds weight gain and lower extremity edema.  Evaluation in the ED: Patient was found to be afebrile, heart rate 130s A. fib, blood pressure 112/87.  Chest x-ray with pulmonary vascular congestion, BNP 325.  CTA chest was negative for PE.  7 mm nodule in the right upper lobe.  Patient was admitted for heart failure exacerbation and A. fib with RVR.     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 fib with RVR, chest x-ray with pulmonary edema, elevated BNP. -2D echo showed severe tricuspid valve regurgitation and moderate mitral valve regurgitation.  ejection fraction decrease to 25 to 30% -Received IV Lasix in the ED. -Transition to oral Lasix 40 mg daily. -Follow cardiology recommendation in regards tricuspid or mitral valve regurgitation. Plan for follow up ECHO out patient after rate controlled.  -Cardiology considering ischemic evaluation out patient. No candidate for cardiac CT while still in rapid  A fib.  -Cardiology planning start entresto if possible this hospitalization.   A fib; Continue with  metoprolol. Cardizem discontinued due to new lower ejection fraction Continue with Xarelto Plan to increase metoprolol dose today   HTN; Continue with metoprolol.  Hold HCTZ, now on lasix.   DM type 2:  On diet.  Not currently taking metformin.  HbA1c: 6.7  OSA: Continue with CPAP.   HLD;  Continue with Lipitor.   Hx CVA, TIA: Continue with statins,   Elevation troponin; cardiology following.     Estimated body mass index is 35.38 kg/m as calculated from the following:   Height as of this encounter: 5\' 4"  (1.626 m).   Weight as of this encounter: 93.5 kg.   DVT prophylaxis: xarelto Code Status: Full code Family Communication: care discussed with patient Disposition Plan:  Status is: Inpatient  Remains inpatient appropriate because:IV treatments appropriate due to intensity of illness or inability to take PO  Dispo: The patient is from: Home              Anticipated d/c is to: Home              Patient currently is not medically stable to d/c.   Difficult to place patient No        Consultants:  Cardiology   Procedures:  ECHO; EF 25 %  Antimicrobials:    Subjective: She is breathing better, denies chest pain pain.  HR still 120   Objective: Vitals:   03/30/21 0400 03/30/21 0600 03/30/21 0721 03/30/21 1117  BP:   121/82 124/89  Pulse: (!) 120 (!) 119 97 (!) 106  Resp: (!) 21 18 16 20   Temp:   98.9 F (37.2 C) 98.9 F (37.2 C)  TempSrc:  Oral Oral  SpO2: 91% 98% 94% 92%  Weight:      Height:        Intake/Output Summary (Last 24 hours) at 03/30/2021 1432 Last data filed at 03/30/2021 1119 Gross per 24 hour  Intake 700 ml  Output 1700 ml  Net -1000 ml    Filed Weights   03/28/21 1041 03/29/21 0705 03/30/21 0300  Weight: 83.5 kg 93.5 kg 93.5 kg    Examination:  General exam: NAD Respiratory system: CTA Cardiovascular system: S 1, S 2 IRR Gastrointestinal system: BS present, soft, nt Central nervous system: Non focal.   Extremities: no edema   Data Reviewed: I have personally reviewed following labs and imaging studies  CBC: Recent Labs  Lab 03/28/21 0529  WBC 7.8  NEUTROABS 5.3  HGB 12.2  HCT 40.5  MCV 84.2  PLT 025    Basic Metabolic Panel: Recent Labs  Lab 03/28/21 0529 03/28/21 0916 03/29/21 0541 03/29/21 0902 03/30/21 0015  NA 138  --  136 136 137  K 3.5  --  5.2* 3.9 3.8  CL 108  --  107 106 104  CO2 21*  --  18* 21* 23  GLUCOSE 146*  --  116* 142* 121*  BUN 12  --  20 16 11   CREATININE 0.92  --  1.53* 1.21* 0.90  CALCIUM 8.8*  --  8.9 8.9 8.4*  MG  --  2.1 2.1  --   --   PHOS  --   --  4.6  --   --     GFR: Estimated Creatinine Clearance: 77.4 mL/min (by C-G formula based on SCr of 0.9 mg/dL). Liver Function Tests: No results for input(s): AST, ALT, ALKPHOS, BILITOT, PROT, ALBUMIN in the last 168 hours. No results for input(s): LIPASE, AMYLASE in the last 168 hours. No results for input(s): AMMONIA in the last 168 hours. Coagulation Profile: No results for input(s): INR, PROTIME in the last 168 hours. Cardiac Enzymes: No results for input(s): CKTOTAL, CKMB, CKMBINDEX, TROPONINI in the last 168 hours. BNP (last 3 results) No results for input(s): PROBNP in the last 8760 hours. HbA1C: Recent Labs    03/28/21 1129  HGBA1C 6.7*    CBG: No results for input(s): GLUCAP in the last 168 hours. Lipid Profile: Recent Labs    03/29/21 0036  CHOL 111  HDL 18*  LDLCALC 79  TRIG 71  CHOLHDL 6.2    Thyroid Function Tests: Recent Labs    03/28/21 0917  TSH 2.122    Anemia Panel: No results for input(s): VITAMINB12, FOLATE, FERRITIN, TIBC, IRON, RETICCTPCT in the last 72 hours. Sepsis Labs: No results for input(s): PROCALCITON, LATICACIDVEN in the last 168 hours.  Recent Results (from the past 240 hour(s))  Resp Panel by RT-PCR (Flu A&B, Covid) Nasopharyngeal Swab     Status: None   Collection Time: 03/28/21  7:48 AM   Specimen: Nasopharyngeal Swab;  Nasopharyngeal(NP) swabs in vial transport medium  Result Value Ref Range Status   SARS Coronavirus 2 by RT PCR NEGATIVE NEGATIVE Final    Comment: (NOTE) SARS-CoV-2 target nucleic acids are NOT DETECTED.  The SARS-CoV-2 RNA is generally detectable in upper respiratory specimens during the acute phase of infection. The lowest concentration of SARS-CoV-2 viral copies this assay can detect is 138 copies/mL. A negative result does not preclude SARS-Cov-2 infection and should not be used as the sole basis for treatment or other patient management decisions. A negative result may occur with  improper  specimen collection/handling, submission of specimen other than nasopharyngeal swab, presence of viral mutation(s) within the areas targeted by this assay, and inadequate number of viral copies(<138 copies/mL). A negative result must be combined with clinical observations, patient history, and epidemiological information. The expected result is Negative.  Fact Sheet for Patients:  EntrepreneurPulse.com.au  Fact Sheet for Healthcare Providers:  IncredibleEmployment.be  This test is no t yet approved or cleared by the [place] FDA and  has been authorized for detection and/or diagnosis of SARS-CoV-2 by FDA under an Emergency Use Authorization (EUA). This EUA will remain  in effect (meaning this test can be used) for the duration of the COVID-19 declaration under Section 564(b)(1) of the Act, 21 U.S.C.section 360bbb-3(b)(1), unless the authorization is terminated  or revoked sooner.       Influenza A by PCR NEGATIVE NEGATIVE Final   Influenza B by PCR NEGATIVE NEGATIVE Final    Comment: (NOTE) The Xpert Xpress SARS-CoV-2/FLU/RSV plus assay is intended as an aid in the diagnosis of influenza from Nasopharyngeal swab specimens and should not be used as a sole basis for treatment. Nasal washings and aspirates are unacceptable for Xpert Xpress  SARS-CoV-2/FLU/RSV testing.  Fact Sheet for Patients: EntrepreneurPulse.com.au  Fact Sheet for Healthcare Providers: IncredibleEmployment.be  This test is not yet approved or cleared by the [place] FDA and has been authorized for detection and/or diagnosis of SARS-CoV-2 by FDA under an Emergency Use Authorization (EUA). This EUA will remain in effect (meaning this test can be used) for the duration of the COVID-19 declaration under Section 564(b)(1) of the Act, 21 U.S.C. section 360bbb-3(b)(1), unless the authorization is terminated or revoked.  Performed at [facility], [place] [address] [place], [place] [address]   MRSA Next Gen by PCR, Nasal     Status: None   Collection Time: 03/28/21 11:05 AM   Specimen: Nasal Mucosa; Nasal Swab  Result Value Ref Range Status   MRSA by PCR Next Gen NOT DETECTED NOT DETECTED Final    Comment: (NOTE) The GeneXpert MRSA Assay (FDA approved for NASAL specimens only), is one component of a comprehensive MRSA colonization surveillance program. It is not intended to diagnose MRSA infection nor to guide or monitor treatment for MRSA infections. Test performance is not FDA approved in patients less than 43 years old. Performed at [facility], [place] [address] [place], [place] [address]           Radiology Studies: No results found.      Scheduled Meds:  atorvastatin  80 mg Oral q1800   furosemide  40 mg Oral Daily   gabapentin  300 mg Oral BID   metoprolol tartrate  100 mg Oral BID   rivaroxaban  20 mg Oral Q supper   sodium chloride flush  3 mL Intravenous Q12H   spironolactone  12.5 mg Oral Daily   Continuous Infusions:  sodium chloride       LOS: 2 days    Time spent: 35 minutes.     Elmarie Shiley, MD [facility]   If 7PM-7AM, please contact night-coverage [email or web address]  03/30/2021, 2:32 PM

## 2021-03-30 NOTE — Progress Notes (Signed)
Progress Note  Patient Name: [name] Date of Encounter: 03/30/2021  [facility] Cardiologist: Sanda Klein, MD   Subjective   Feeling much better.  Able to lie fully supine in bed.  Remains in atrial fibrillation with RVR 110-120 at rest.  Refused to use CPAP last night. Reports that at home she cannot use CPAP because the electrical socket is in conveniently located in rapport to her bed. She has noticed increased palpitations for maybe as much as 6 months. She has had PND for weeks preceding this hospitalization.  She did not recognize this as a sign of heart failure.   Inpatient Medications    Scheduled Meds:  atorvastatin  80 mg Oral q1800   furosemide  40 mg Oral Daily   gabapentin  300 mg Oral BID   metoprolol tartrate  50 mg Oral BID   rivaroxaban  20 mg Oral Q supper   sodium chloride flush  3 mL Intravenous Q12H   Continuous Infusions:  sodium chloride     PRN Meds: sodium chloride, acetaminophen, cyclobenzaprine, ondansetron (ZOFRAN) IV, sodium chloride flush   Vital Signs    Vitals:   03/30/21 0300 03/30/21 0400 03/30/21 0600 03/30/21 0721  BP: (!) 136/101   121/82  Pulse: (!) 107 (!) 120 (!) 119 97  Resp: (!) 29 (!) 21 18 16   Temp: (!) 97.1 F (36.2 C)   98.9 F (37.2 C)  TempSrc: Axillary   Oral  SpO2: 93% 91% 98% 94%  Weight: 93.5 kg     Height:        Intake/Output Summary (Last 24 hours) at 03/30/2021 0942 Last data filed at 03/30/2021 0800 Gross per 24 hour  Intake 800 ml  Output 1300 ml  Net -500 ml   Last 3 Weights 03/30/2021 03/29/2021 03/28/2021  Weight (lbs) 206 lb 2.1 oz 206 lb 2.1 oz 184 lb 1.4 oz  Weight (kg) 93.5 kg 93.5 kg 83.5 kg      Telemetry    Atrial fibrillation rapid ventricular response and occasional aberrant conduction versus PVCs- Personally Reviewed  ECG    Atrial fibrillation with controlled ventricular response- Personally Reviewed  Physical Exam  Obese.  Appears comfortable lying fully supine in  bed. GEN: No acute distress.   Neck: No JVD Cardiac: Irregular, tachycardic, no murmurs, rubs, or gallops.  Respiratory: Clear to auscultation bilaterally. GI: Soft, nontender, non-distended  MS: No edema; No deformity. Neuro:  Nonfocal  Psych: Normal affect   Labs    High Sensitivity Troponin:   Recent Labs  Lab 03/28/21 0529 03/28/21 0727  TROPONINIHS 103* 107*      Chemistry Recent Labs  Lab 03/29/21 0541 03/29/21 0902 03/30/21 0015  NA 136 136 137  K 5.2* 3.9 3.8  CL 107 106 104  CO2 18* 21* 23  GLUCOSE 116* 142* 121*  BUN 20 16 11   CREATININE 1.53* 1.21* 0.90  CALCIUM 8.9 8.9 8.4*  GFRNONAA 40* 53* >60  ANIONGAP 11 9 10      Hematology Recent Labs  Lab 03/28/21 0529  WBC 7.8  RBC 4.81  HGB 12.2  HCT 40.5  MCV 84.2  MCH 25.4*  MCHC 30.1  RDW 15.6*  PLT 213    BNP Recent Labs  Lab 03/28/21 0529  BNP 325.5*     DDimer No results for input(s): DDIMER in the last 168 hours.   Radiology    ECHOCARDIOGRAM COMPLETE  Result Date: 03/28/2021    ECHOCARDIOGRAM REPORT   Patient Name:  [name] Date of Exam: 03/28/2021 Medical Rec #:  [id number]     Height:       64.0 in Accession #:    [id number]    Weight:       222.0 lb Date of Birth:  02/05/1966      BSA:          2.045 m Patient Age:    55 years      BP:           86/55 mmHg Patient Gender: F             HR:           67 bpm. Exam Location:  Inpatient Procedure: 2D Echo, Cardiac Doppler, Color Doppler and Intracardiac            Opacification Agent Indications:     CHF-Acute Diastolic K35.46  History:         Patient has prior history of Echocardiogram examinations, most                  recent 05/09/2019. Stroke and TIA, Arrythmias:NSVT,                  Signs/Symptoms:Murmur; Risk Factors:Hypertension.  Sonographer:     Darlina Sicilian RDCS Referring Phys:  [id number] [place] Diagnosing Phys: Loralie Champagne MD IMPRESSIONS  1. Left ventricular ejection fraction, by estimation, is 25 to 30%. The left  ventricle has severely decreased function. The left ventricle demonstrates global hypokinesis. There is mild left ventricular hypertrophy. Left ventricular diastolic parameters  are indeterminate.  2. Right ventricular systolic function is mildly reduced. The right ventricular size is moderately enlarged. There is normal pulmonary artery systolic pressure. The estimated right ventricular systolic pressure is 17.0 mmHg.  3. Left atrial size was mildly dilated.  4. Right atrial size was moderately dilated.  5. The mitral valve is normal in structure. Moderate mitral valve regurgitation. No evidence of mitral stenosis.  6. Tricuspid valve regurgitation is severe. Suspect functional from annular dilatation.  7. The aortic valve is tricuspid. Aortic valve regurgitation is mild. No aortic stenosis is present.  8. The inferior vena cava is dilated in size with >50% respiratory variability, suggesting right atrial pressure of 8 mmHg.  9. The patient was in atrial fibrillation. 10. Significant change in LV and RV function compared to prior echo. FINDINGS  Left Ventricle: Left ventricular ejection fraction, by estimation, is 25 to 30%. The left ventricle has severely decreased function. The left ventricle demonstrates global hypokinesis. Definity contrast agent was given IV to delineate the left ventricular endocardial borders. The left ventricular internal cavity size was normal in size. There is mild left ventricular hypertrophy. Left ventricular diastolic parameters are indeterminate. Right Ventricle: The right ventricular size is moderately enlarged. No increase in right ventricular wall thickness. Right ventricular systolic function is mildly reduced. There is normal pulmonary artery systolic pressure. The tricuspid regurgitant velocity is 1.53 m/s, and with an assumed right atrial pressure of 8 mmHg, the estimated right ventricular systolic pressure is 01.7 mmHg. Left Atrium: Left atrial size was mildly dilated. Right  Atrium: Right atrial size was moderately dilated. Pericardium: Trivial pericardial effusion is present. Mitral Valve: The mitral valve is normal in structure. Moderate mitral valve regurgitation. No evidence of mitral valve stenosis. Tricuspid Valve: The tricuspid valve is normal in structure. Tricuspid valve regurgitation is severe. Aortic Valve: The aortic valve is tricuspid. Aortic valve regurgitation is mild. Aortic regurgitation PHT measures  667 msec. No aortic stenosis is present. Pulmonic Valve: The pulmonic valve was normal in structure. Pulmonic valve regurgitation is not visualized. Aorta: The aortic root is normal in size and structure. Venous: The inferior vena cava is dilated in size with greater than 50% respiratory variability, suggesting right atrial pressure of 8 mmHg. IAS/Shunts: No atrial level shunt detected by color flow Doppler.  LEFT VENTRICLE PLAX 2D LVIDd:         4.20 cm LVIDs:         3.30 cm LV PW:         1.30 cm LV IVS:        1.50 cm LVOT diam:     1.80 cm LV SV:         19 LV SV Index:   9 LVOT Area:     2.54 cm  LV Volumes (MOD) LV vol d, MOD A2C: 81.7 ml LV vol d, MOD A4C: 62.0 ml LV vol s, MOD A2C: 65.6 ml LV vol s, MOD A4C: 53.4 ml LV SV MOD A2C:     16.1 ml LV SV MOD A4C:     62.0 ml LV SV MOD BP:      12.0 ml RIGHT VENTRICLE RV S prime:     7.12 cm/s TAPSE (M-mode): 0.9 cm LEFT ATRIUM           Index       RIGHT ATRIUM           Index LA diam:      3.40 cm 1.66 cm/m  RA Area:     16.20 cm LA Vol (A2C): 32.9 ml 16.09 ml/m RA Volume:   38.60 ml  18.87 ml/m  AORTIC VALVE LVOT Vmax:   49.80 cm/s LVOT Vmean:  30.950 cm/s LVOT VTI:    0.076 m AI PHT:      667 msec  AORTA Ao Root diam: 2.80 cm Ao Asc diam:  2.70 cm MITRAL VALVE                 TRICUSPID VALVE MV Area (PHT): 5.75 cm      TR Peak grad:   9.4 mmHg MV Decel Time: 132 msec      TR Vmax:        153.00 cm/s MR Peak grad:    38.4 mmHg MR Mean grad:    24.0 mmHg   SHUNTS MR Vmax:         310.00 cm/s Systemic VTI:  0.08 m  MR Vmean:        230.0 cm/s  Systemic Diam: 1.80 cm MR PISA:         1.57 cm MR PISA Eff ROA: 17 mm MR PISA Radius:  0.50 cm MV E velocity: 81.65 cm/s Loralie Champagne MD Electronically signed by Loralie Champagne MD Signature Date/Time: 03/28/2021/2:30:39 PM    Final (Updated)     Cardiac Studies  Echo 03/28/2021  1. Left ventricular ejection fraction, by estimation, is 25 to 30%. The left ventricle has severely decreased function. The left ventricle demonstrates global hypokinesis. There is mild left ventricular hypertrophy. Left ventricular diastolic parameters  are indeterminate.   2. Right ventricular systolic function is mildly reduced. The right ventricular size is moderately enlarged. There is normal pulmonary artery systolic pressure. The estimated right ventricular systolic pressure is 76.2 mmHg.   3. Left atrial size was mildly dilated.   4. Right atrial size was moderately dilated.   5. The mitral valve is normal  in structure. Moderate mitral valve regurgitation. No evidence of mitral stenosis.   6. Tricuspid valve regurgitation is severe. Suspect functional from annular dilatation.   7. The aortic valve is tricuspid. Aortic valve regurgitation is mild. No aortic stenosis is present.   8. The inferior vena cava is dilated in size with >50% respiratory variability, suggesting right atrial pressure of 8 mmHg.   9. The patient was in atrial fibrillation.  10. Significant change in LV and RV function compared to prior echo.  Patient Profile     55 y.o. 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   Alternatively could have ischemic  cardiomyopathy, awaiting coronary CT angiography (this cannot be completed with fast irregular rate, may need to consider conventional coronary angiography).  Discussed the importance of dietary sodium restriction and daily weight monitoring.  Reviewed orthopnea and PND as signs of volume overload/heart failure exacerbation and what she should do under the circumstances.  Plan is to start Entresto if blood pressure allows, but rate control was the first priority so we will increase metoprolol. AFib: Note that as an outpatient we were unable to control her heart rate with only 1 agent.  Diltiazem has been stopped due to low EF.  May need to consider digoxin.  Today we will increase her metoprolol 200 mg twice daily.  On anticoagulants.  CHA2DS2-VASc 6 (history of stroke, female gender, DM, hypertension, CHF).  It will be important to have some type of home ECG monitoring equipment to detect periods of RVR in the future.  She will be purchasing a Kardia device.  TSH is normal. OSA: Has not been compliant with treatment.  This may be contributing to be poorly controlled ventricular rates. DM: Has been well controlled, recent hemoglobin A1c 6.7%.  Would benefit from SGLT2 inhibitor such as [place] or [place]. HTN: Well-controlled.  Does not outpatient she was also receiving diltiazem and hydrochlorothiazide, now just on beta-blockers plus loop diuretics.   HLP: On maximum dose atorvastatin.  LDL less than 100 issatisfactory if she does not have CAD or PAD, but will have to augment treatment if we identify CAD.  Note severely reduced HDL cholesterol only 18 which puts her at high risk for vascular problems. AKI: Improving.  Can add Entresto and/or spironolactone.     For questions or updates, please contact [place] Please consult [email or web address] for contact info under        Signed, Sanda Klein, MD  03/30/2021, 9:42 AM

## 2021-03-31 DIAGNOSIS — I4811 Longstanding persistent atrial fibrillation: Secondary | ICD-10-CM | POA: Diagnosis not present

## 2021-03-31 DIAGNOSIS — I5041 Acute combined systolic (congestive) and diastolic (congestive) heart failure: Secondary | ICD-10-CM | POA: Diagnosis not present

## 2021-03-31 DIAGNOSIS — Z8673 Personal history of transient ischemic attack (TIA), and cerebral infarction without residual deficits: Secondary | ICD-10-CM | POA: Diagnosis not present

## 2021-03-31 DIAGNOSIS — I10 Essential (primary) hypertension: Secondary | ICD-10-CM | POA: Diagnosis not present

## 2021-03-31 DIAGNOSIS — I4891 Unspecified atrial fibrillation: Secondary | ICD-10-CM | POA: Diagnosis not present

## 2021-03-31 DIAGNOSIS — I5021 Acute systolic (congestive) heart failure: Secondary | ICD-10-CM | POA: Diagnosis not present

## 2021-03-31 LAB — BASIC METABOLIC PANEL
Anion gap: 7 (ref 5–15)
BUN: 10 mg/dL (ref 6–20)
CO2: 24 mmol/L (ref 22–32)
Calcium: 8.4 mg/dL — ABNORMAL LOW (ref 8.9–10.3)
Chloride: 106 mmol/L (ref 98–111)
Creatinine, Ser: 0.83 mg/dL (ref 0.44–1.00)
GFR, Estimated: >60 mL/min (ref >60)
Glucose, Bld: 104 mg/dL — ABNORMAL HIGH (ref 70–99)
Potassium: 3.8 mmol/L (ref 3.5–5.1)
Sodium: 137 mmol/L (ref 135–145)

## 2021-03-31 LAB — GLUCOSE, CAPILLARY: Glucose-Capillary: 106 mg/dL — ABNORMAL HIGH (ref 70–99)

## 2021-03-31 MED ORDER — DIGOXIN 125 MCG PO TABS
0.1250 mg | ORAL_TABLET | Freq: Every day | ORAL | Status: DC
  Administered 2021-03-31 – 2021-04-03 (×4): 0.125 mg via ORAL
  Filled 2021-03-31 (×4): qty 1

## 2021-03-31 NOTE — Progress Notes (Signed)
Progress Note  Patient Name: [name] Date of Encounter: 03/31/2021  [facility] Cardiologist: Sanda Klein, MD   Subjective   Feeling better. Has not walked in hall yet. Remains poorly rate controlled, although better around 100-105 bpm.  Inpatient Medications    Scheduled Meds:  atorvastatin  80 mg Oral q1800   furosemide  40 mg Oral Daily   gabapentin  300 mg Oral BID   metoprolol tartrate  100 mg Oral BID   rivaroxaban  20 mg Oral Q supper   sodium chloride flush  3 mL Intravenous Q12H   spironolactone  12.5 mg Oral Daily   Continuous Infusions:  sodium chloride     PRN Meds: sodium chloride, acetaminophen, cyclobenzaprine, ondansetron (ZOFRAN) IV, sodium chloride flush   Vital Signs    Vitals:   03/30/21 2300 03/31/21 0013 03/31/21 0336 03/31/21 0711  BP: 123/75  118/83 115/77  Pulse: (!) 108 (!) 112 (!) 109 (!) 102  Resp: (!) 23 13 16 20   Temp: 98.4 F (36.9 C)  98.7 F (37.1 C) 98.7 F (37.1 C)  TempSrc: Oral  Axillary Axillary  SpO2: 94% 98% 98% 93%  Weight:   92.9 kg   Height:        Intake/Output Summary (Last 24 hours) at 03/31/2021 0938 Last data filed at 03/31/2021 0800 Gross per 24 hour  Intake 503 ml  Output 1800 ml  Net -1297 ml   Last 3 Weights 03/31/2021 03/30/2021 03/29/2021  Weight (lbs) 204 lb 12.9 oz 206 lb 2.1 oz 206 lb 2.1 oz  Weight (kg) 92.9 kg 93.5 kg 93.5 kg      Telemetry    AFib w RVR 100-110 - Personally Reviewed  ECG    No new tracing - Personally Reviewed  Physical Exam  Obese GEN: No acute distress.   Neck: No JVD Cardiac: irregular, no murmurs, rubs, or gallops.  Respiratory: Clear to auscultation bilaterally. GI: Soft, nontender, non-distended  MS: No edema; No deformity. Neuro:  Nonfocal  Psych: Normal affect   Labs    High Sensitivity Troponin:   Recent Labs  Lab 03/28/21 0529 03/28/21 0727  TROPONINIHS 103* 107*      Chemistry Recent Labs  Lab 03/29/21 0902 03/30/21 0015  03/31/21 0018  NA 136 137 137  K 3.9 3.8 3.8  CL 106 104 106  CO2 21* 23 24  GLUCOSE 142* 121* 104*  BUN 16 11 10   CREATININE 1.21* 0.90 0.83  CALCIUM 8.9 8.4* 8.4*  GFRNONAA 53* >60 >60  ANIONGAP 9 10 7      Hematology Recent Labs  Lab 03/28/21 0529  WBC 7.8  RBC 4.81  HGB 12.2  HCT 40.5  MCV 84.2  MCH 25.4*  MCHC 30.1  RDW 15.6*  PLT 213    BNP Recent Labs  Lab 03/28/21 0529  BNP 325.5*     DDimer No results for input(s): DDIMER in the last 168 hours.   Radiology    No results found.  Cardiac Studies   Echo 03/28/2021  1. Left ventricular ejection fraction, by estimation, is 25 to 30%. The left ventricle has severely decreased function. The left ventricle demonstrates global hypokinesis. There is mild left ventricular hypertrophy. Left ventricular diastolic parameters  are indeterminate.   2. Right ventricular systolic function is mildly reduced. The right ventricular size is moderately enlarged. There is normal pulmonary artery systolic pressure. The estimated right ventricular systolic pressure is 22.0 mmHg.   3. Left atrial size was mildly  dilated.   4. Right atrial size was moderately dilated.   5. The mitral valve is normal in structure. Moderate mitral valve regurgitation. No evidence of mitral stenosis.   6. Tricuspid valve regurgitation is severe. Suspect functional from annular dilatation.   7. The aortic valve is tricuspid. Aortic valve regurgitation is mild. No aortic stenosis is present.   8. The inferior vena cava is dilated in size with >50% respiratory variability, suggesting right atrial pressure of 8 mmHg.   9. The patient was in atrial fibrillation. 10. Significant change in LV and RV function compared to prior echo.  Patient Profile     55 y.o. 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   Alternatively could have ischemic cardiomyopathy, awaiting coronary CT angiography (this cannot be completed with fast irregular rate, may need to consider conventional coronary angiography).  Discussed the importance of dietary sodium restriction and daily weight monitoring.  Reviewed orthopnea and PND as signs of volume overload/heart failure exacerbation and what she should do under the circumstances.  Plan is to start Entresto if blood pressure allows, but rate control was the first priority so we will increase metoprolol. Will hold off RAAS inhibitor until she has coronary angiography (whether CT or conventional). AFib: Note that as an outpatient we were unable to control her heart rate with only 1 agent.  Diltiazem has been stopped due to low EF. Add digoxin today, yesterday we increased her metoprolol to 100 mg twice daily.  On anticoagulants.  CHA2DS2-VASc 6 (history of stroke, female gender, DM, hypertension, CHF).  It will be important to have some type of home ECG monitoring equipment to detect periods of RVR in the future.  She will be purchasing a Kardia device.  TSH is normal. OSA: Has not been compliant with treatment.  This may be contributing to be poorly controlled ventricular rates. Encouraged 100% compliance w CPAP. DM: Has been well controlled, recent hemoglobin A1c 6.7%.  Would benefit from SGLT2 inhibitor such as [place] or [place]. HTN: Well-controlled.  As outpatient she was also receiving diltiazem and hydrochlorothiazide, now just on beta-blockers plus loop diuretics.   HLP: On maximum dose atorvastatin.  LDL less than 100 issatisfactory if she does not have CAD or PAD, but will have to augment treatment if we  identify CAD.  Note severely reduced HDL cholesterol only 18 which puts her at high risk for vascular problems. AKI: Resolved.  Can add Entresto and/or spironolactone after angiography.     For questions or updates, please contact [place] Please consult [email or web address] for contact info under        Signed, Sanda Klein, MD  03/31/2021, 9:38 AM

## 2021-03-31 NOTE — Progress Notes (Signed)
PROGRESS NOTE    [name]  NOM:[id number] DOB: 09/13/1966 DOA: 03/28/2021 PCP: [name], MD   Brief Narrative: 55 year old with past medical history significant for A. fib on anticoagulation, CVA/TIA, hypertension presents complaining of worsening shortness of breath, worse over the last 2 days prior to admission.  She reports a chronic palpitation from her A. fib.  She noticed worsening palpitation.  She reports 10 pounds weight gain and lower extremity edema.  Evaluation in the ED: Patient was found to be afebrile, heart rate 130s A. fib, blood pressure 112/87.  Chest x-ray with pulmonary vascular congestion, BNP 325.  CTA chest was negative for PE.  7 mm nodule in the right upper lobe.  Patient was admitted for heart failure exacerbation and A. fib with RVR.     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 fib with RVR, chest x-ray with pulmonary edema, elevated BNP. -2D echo showed severe tricuspid valve regurgitation and moderate mitral valve regurgitation.  ejection fraction decrease to 25 to 30% -Received IV Lasix in the ED. -On Lasix and spironolactone. -Follow cardiology recommendation in regards tricuspid or mitral valve regurgitation. Plan for follow up ECHO out patient after rate controlled.  -Cardiology considering ischemic evaluation out patient. No candidate for cardiac CT while still in rapid  A fib.  -Cardiology planning start entresto/ after angiography.   A fib; Continue with metoprolol., dose increase  7/16. Cardizem discontinued due to new lower ejection fraction Continue with Xarelto Started on digoxin 7/17.  HTN; Continue with metoprolol.  Hold HCTZ, now on lasix.   DM type 2:  On diet.  Not currently taking metformin.  HbA1c: 6.7  OSA: Continue with CPAP.   HLD:  Continue with Lipitor.   Hx CVA, TIA: Continue with statins,   Elevation troponin; cardiology following.     Estimated body mass index is 35.16 kg/m as calculated from the following:   Height as of this encounter: 5\' 4"  (1.626 m).   Weight as of this encounter: 92.9 kg.   DVT prophylaxis: Xarelto Code Status: Full code Family Communication: care discussed with patient Disposition Plan:  Status is: Inpatient  Remains inpatient appropriate because:IV treatments appropriate due to intensity of illness or inability to take PO  Dispo: The patient is from: Home              Anticipated d/c is to: Home              Patient currently is not medically stable to d/c.   Difficult to place patient No        Consultants:  Cardiology   Procedures:  ECHO; EF 25 %  Antimicrobials:    Subjective: She is doing well, denies worsening dyspnea, HR in the 100 range.   Objective: Vitals:   03/31/21 0013 03/31/21 0336 03/31/21 0711 03/31/21 1130  BP:  118/83 115/77 (!) 138/92  Pulse: (!) 112 (!) 109 (!) 102 (!) 105  Resp: 13 16 20 20   Temp:  98.7 F (37.1 C) 98.7 F (37.1 C) 98.3 F (36.8 C)  TempSrc:  Axillary Axillary Oral  SpO2: 98% 98% 93% 97%  Weight:  92.9 kg    Height:        Intake/Output Summary (Last 24 hours) at 03/31/2021 1400 Last data filed at 03/31/2021 1212 Gross per 24 hour  Intake 603 ml  Output 1500 ml  Net -897 ml    Filed Weights   03/29/21 0705 03/30/21 0300 03/31/21 0336  Weight: 93.5 kg 93.5 kg 92.9 kg    Examination:  General exam: NAD Respiratory system: CTA Cardiovascular system: S 1, S 2 RR Gastrointestinal system: BS present, soft, nt Central nervous  system: Non focal.  Extremities: No edema   Data Reviewed: I have personally reviewed following labs and imaging studies  CBC: Recent Labs  Lab 03/28/21 0529  WBC 7.8  NEUTROABS 5.3  HGB 12.2  HCT 40.5  MCV 84.2  PLT 527    Basic Metabolic Panel: Recent Labs  Lab 03/28/21 0529 03/28/21 0916 03/29/21 0541 03/29/21 0902 03/30/21 0015 03/31/21 0018  NA 138  --  136 136 137 137  K 3.5  --  5.2* 3.9 3.8 3.8  CL 108  --  107 106 104 106  CO2 21*  --  18* 21* 23 24  GLUCOSE 146*  --  116* 142* 121* 104*  BUN 12  --  20 16 11 10   CREATININE 0.92  --  1.53* 1.21* 0.90 0.83  CALCIUM 8.8*  --  8.9 8.9 8.4* 8.4*  MG  --  2.1 2.1  --   --   --   PHOS  --   --  4.6  --   --   --     GFR: Estimated Creatinine Clearance: 83.6 mL/min (by C-G formula based on SCr of 0.83 mg/dL). Liver Function Tests: No results for input(s): AST, ALT, ALKPHOS, BILITOT, PROT, ALBUMIN in the last 168 hours. No results for input(s): LIPASE, AMYLASE in the last 168 hours. No results for input(s): AMMONIA in the last 168 hours. Coagulation Profile: No results for input(s): INR, PROTIME in the last 168 hours. Cardiac Enzymes: No results for input(s): CKTOTAL, CKMB, CKMBINDEX, TROPONINI in the last 168 hours. BNP (last 3 results) No results for input(s): PROBNP in the last 8760 hours. HbA1C: No results for input(s): HGBA1C in the last 72 hours.  CBG: Recent Labs  Lab 03/31/21 1134  GLUCAP 106*   Lipid Profile: Recent Labs    03/29/21 0036  CHOL 111  HDL 18*  LDLCALC 79  TRIG 71  CHOLHDL 6.2    Thyroid Function Tests: No results for input(s): TSH, T4TOTAL, FREET4, T3FREE, THYROIDAB in the last 72 hours.  Anemia Panel: No results for input(s): VITAMINB12, FOLATE, FERRITIN, TIBC, IRON, RETICCTPCT in the last 72 hours. Sepsis Labs: No results for input(s): PROCALCITON, LATICACIDVEN in the last 168 hours.  Recent Results (from the past 240 hour(s))  Resp Panel by RT-PCR (Flu A&B,  Covid) Nasopharyngeal Swab     Status: None   Collection Time: 03/28/21  7:48 AM   Specimen: Nasopharyngeal Swab; Nasopharyngeal(NP) swabs in vial transport medium  Result Value Ref Range Status   SARS Coronavirus 2 by RT PCR NEGATIVE NEGATIVE Final    Comment: (NOTE) SARS-CoV-2 target nucleic acids are NOT DETECTED.  The SARS-CoV-2 RNA is generally detectable in upper respiratory specimens during the acute phase of infection. The lowest concentration of SARS-CoV-2 viral copies this assay can detect is 138 copies/mL. A negative result does not preclude SARS-Cov-2 infection and should not be used  as the sole basis for treatment or other patient management decisions. A negative result may occur with  improper specimen collection/handling, submission of specimen other than nasopharyngeal swab, presence of viral mutation(s) within the areas targeted by this assay, and inadequate number of viral copies(<138 copies/mL). A negative result must be combined with clinical observations, patient history, and epidemiological information. The expected result is Negative.  Fact Sheet for Patients:  EntrepreneurPulse.com.au  Fact Sheet for Healthcare Providers:  IncredibleEmployment.be  This test is no t yet approved or cleared by the [place] FDA and  has been authorized for detection and/or diagnosis of SARS-CoV-2 by FDA under an Emergency Use Authorization (EUA). This EUA will remain  in effect (meaning this test can be used) for the duration of the COVID-19 declaration under Section 564(b)(1) of the Act, 21 U.S.C.section 360bbb-3(b)(1), unless the authorization is terminated  or revoked sooner.       Influenza A by PCR NEGATIVE NEGATIVE Final   Influenza B by PCR NEGATIVE NEGATIVE Final    Comment: (NOTE) The Xpert Xpress SARS-CoV-2/FLU/RSV plus assay is intended as an aid in the diagnosis of influenza from Nasopharyngeal swab specimens and should  not be used as a sole basis for treatment. Nasal washings and aspirates are unacceptable for Xpert Xpress SARS-CoV-2/FLU/RSV testing.  Fact Sheet for Patients: EntrepreneurPulse.com.au  Fact Sheet for Healthcare Providers: IncredibleEmployment.be  This test is not yet approved or cleared by the [place] FDA and has been authorized for detection and/or diagnosis of SARS-CoV-2 by FDA under an Emergency Use Authorization (EUA). This EUA will remain in effect (meaning this test can be used) for the duration of the COVID-19 declaration under Section 564(b)(1) of the Act, 21 U.S.C. section 360bbb-3(b)(1), unless the authorization is terminated or revoked.  Performed at [facility], [place] [address], [place], [place] [address]   MRSA Next Gen by PCR, Nasal     Status: None   Collection Time: 03/28/21 11:05 AM   Specimen: Nasal Mucosa; Nasal Swab  Result Value Ref Range Status   MRSA by PCR Next Gen NOT DETECTED NOT DETECTED Final    Comment: (NOTE) The GeneXpert MRSA Assay (FDA approved for NASAL specimens only), is one component of a comprehensive MRSA colonization surveillance program. It is not intended to diagnose MRSA infection nor to guide or monitor treatment for MRSA infections. Test performance is not FDA approved in patients less than 78 years old. Performed at [facility], [place] [address], [place], [place] [address]           Radiology Studies: No results found.      Scheduled Meds:  atorvastatin  80 mg Oral q1800   digoxin  0.125 mg Oral Daily   furosemide  40 mg Oral Daily   gabapentin  300 mg Oral BID   metoprolol tartrate  100 mg Oral BID   rivaroxaban  20 mg Oral Q supper   sodium chloride flush  3 mL Intravenous Q12H   spironolactone  12.5 mg Oral Daily   Continuous Infusions:  sodium chloride       LOS: 3 days    Time spent: 35 minutes.     Elmarie Shiley, MD [facility]   If 7PM-7AM, please contact night-coverage [email or web address]  03/31/2021, 2:00 PM

## 2021-04-01 ENCOUNTER — Other Ambulatory Visit: Payer: Self-pay | Admitting: Student

## 2021-04-01 ENCOUNTER — Ambulatory Visit: Payer: Medicaid Other | Admitting: Family Medicine

## 2021-04-01 ENCOUNTER — Other Ambulatory Visit: Payer: Self-pay

## 2021-04-01 DIAGNOSIS — I4891 Unspecified atrial fibrillation: Secondary | ICD-10-CM | POA: Diagnosis not present

## 2021-04-01 DIAGNOSIS — I5042 Chronic combined systolic (congestive) and diastolic (congestive) heart failure: Secondary | ICD-10-CM | POA: Diagnosis present

## 2021-04-01 DIAGNOSIS — I5041 Acute combined systolic (congestive) and diastolic (congestive) heart failure: Secondary | ICD-10-CM | POA: Diagnosis present

## 2021-04-01 LAB — CBC
HCT: 39.6 % (ref 36.0–46.0)
Hemoglobin: 12.5 g/dL (ref 12.0–15.0)
MCH: 25.8 pg — ABNORMAL LOW (ref 26.0–34.0)
MCHC: 31.6 g/dL (ref 30.0–36.0)
MCV: 81.8 fL (ref 80.0–100.0)
Platelets: 248 10*3/uL (ref 150–400)
RBC: 4.84 MIL/uL (ref 3.87–5.11)
RDW: 15.7 % — ABNORMAL HIGH (ref 11.5–15.5)
WBC: 10.4 10*3/uL (ref 4.0–10.5)
nRBC: 0 % (ref 0.0–0.2)

## 2021-04-01 LAB — BASIC METABOLIC PANEL
Anion gap: 9 (ref 5–15)
BUN: 11 mg/dL (ref 6–20)
CO2: 24 mmol/L (ref 22–32)
Calcium: 8.7 mg/dL — ABNORMAL LOW (ref 8.9–10.3)
Chloride: 103 mmol/L (ref 98–111)
Creatinine, Ser: 1.08 mg/dL — ABNORMAL HIGH (ref 0.44–1.00)
GFR, Estimated: >60 mL/min (ref >60)
Glucose, Bld: 151 mg/dL — ABNORMAL HIGH (ref 70–99)
Potassium: 3.7 mmol/L (ref 3.5–5.1)
Sodium: 136 mmol/L (ref 135–145)

## 2021-04-01 LAB — APTT: aPTT: 36 seconds (ref 24–36)

## 2021-04-01 LAB — HEPARIN LEVEL (UNFRACTIONATED): Heparin Unfractionated: >1.1 IU/mL — ABNORMAL HIGH (ref 0.30–0.70)

## 2021-04-01 MED ORDER — HEPARIN (PORCINE) 25000 UT/250ML-% IV SOLN
1450.0000 [IU]/h | INTRAVENOUS | Status: DC
  Administered 2021-04-01: 1150 [IU]/h via INTRAVENOUS
  Filled 2021-04-01: qty 250

## 2021-04-01 MED ORDER — DAPAGLIFLOZIN PROPANEDIOL 10 MG PO TABS
10.0000 mg | ORAL_TABLET | Freq: Every day | ORAL | Status: DC
  Administered 2021-04-01 – 2021-04-04 (×4): 10 mg via ORAL
  Filled 2021-04-01 (×4): qty 1

## 2021-04-01 MED ORDER — SODIUM CHLORIDE 0.9% FLUSH
3.0000 mL | Freq: Two times a day (BID) | INTRAVENOUS | Status: DC
Start: 2021-04-01 — End: 2021-04-02
  Administered 2021-04-01: 3 mL via INTRAVENOUS

## 2021-04-01 NOTE — Progress Notes (Signed)
Heart Failure Stewardship Pharmacist Progress Note   PCP: [name], MD PCP-Cardiologist: Sanda Klein, MD    HPI:  55 yo F with PMH of afib, CHF, CVA, OSA, HTN, and T2DM. She presented to the ED on 03/28/21 with shortness of breath, orthopnea, and LE edema. She was not taking her PRN furosemide PTA due to not knowing when to appropriately take it. CXR was done and showed cardiomegaly and baseline pulmonary vascular congestion without overt edema. An ECHO was done on 7/14 and LVEF is now reduced to 25-30% (down from 60-65% in 04/2019) with mild LVH and mildly reduced RV systolic function. Also found to be in afib RVR.  Current HF Medications: Furosemide 40 mg daily Metoprolol tartrate 100 mg BID Spironolactone 12.5 mg daily Farxiga 10 mg daily Digoxin 0.125 mg daily  Prior to admission HF Medications: Furosemide 20 mg daily PRN Metoprolol tartrate 25 mg BID  Pertinent Lab Values: Serum creatinine 1.08, BUN 11, Potassium 3.7, Sodium 136, BNP 325.5, Magnesium 2.1, Digoxin level due around 7/21-7/22  Vital Signs: Weight: 199 lbs (admission weight: 206 lbs)  Blood pressure: 130/80s Heart rate: 90-110s   Medication Assistance / Insurance Benefits Check: Does the patient have prescription insurance? Yes Type of insurance plan: [place] Medicaid  Outpatient Pharmacy:  Prior to admission outpatient pharmacy: [facility] Is the patient willing to use [facility] at discharge? Yes    Assessment: 1. Acute on chronic systolic CHF (EF 16-10%), pending ischemic evaluation. NYHA class III symptoms. - Continue furosemide 40 mg daily - Continue metoprolol tartrate 100 mg BID - consider transitioning to XL prior to discharge with new reduced EF - Consider starting Entresto 24/26 mg BID post cath - Continue spironolactone 12.5 mg daily - Agree with starting Farxiga 10 mg daily - Continue digoxin 0.125 mg daily. Level due end of this week - Agree with discontinuing  diltiazem with new reduced EF   Plan: 1) Medication changes recommended at this time: - Agree with changes as above  2) Patient assistance: - Prior authorization required for Farxiga - will complete this today - Prior authorization required for Entresto - can complete these if added - 30-day free copay cards can be used if prior authorization approval is not granted until after discharge - HF TOC appt made for 7/22 (can reschedule pending discharge date)  3)  Education  - To be completed prior to discharge  Kerby Nora, PharmD, BCPS Heart Failure Stewardship Pharmacist Phone [phone]

## 2021-04-01 NOTE — H&P (View-Only) (Signed)
Returned to the patient's room this afternoon to discuss right and left heart catheterization and atrial fibrillation with 2 of her daughters at the bedside and 2 of her daughters on the phone as well as the patient in the room.  All questions answered about need for right and left heart catheterization as well as why CT angiography may be less optimal in this setting necessitating catheter-based procedure.  Discussed the natural history of atrial fibrillation as well as incidence and exacerbating factors.  Reviewed risks and benefits of cardiac catheterization noted below.  INFORMED CONSENT: I have reviewed the risks, indications, and alternatives to cardiac catheterization, possible angioplasty, and stenting with the patient. Risks include but are not limited to bleeding, infection, vascular injury, stroke, myocardial infarction, arrhythmia, kidney injury, radiation-related injury in the case of prolonged fluoroscopy use, emergency cardiac surgery, and death. The patient understands the risks of serious complication is 1-2 in 2505 with diagnostic cardiac cath and 1-2% or less with angioplasty/stenting.   INFORMED CONSENT: I have reviewed the risks, indications, and alternatives to right heart catheterization with the patient. Risks include but are not limited to bleeding, infection, vascular injury, stroke, myocardial infarction, arrhythmia, kidney injury, radiation-related injury in the case of prolonged fluoroscopy use, emergency cardiac surgery, and death. The patient understands the risks of serious complication is 1-2 in 3976 with diagnostic cardiac cath, and is willing to proceed.  Patient and family demonstrate understanding and we participated in shared decision making.  We will pursue right and left heart catheterization tomorrow.  Xarelto is on hold and IV heparin has been initiated.  >30 minutes additional patient facing care.

## 2021-04-01 NOTE — Progress Notes (Signed)
Returned to the patient's room this afternoon to discuss right and left heart catheterization and atrial fibrillation with 2 of her daughters at the bedside and 2 of her daughters on the phone as well as the patient in the room.  All questions answered about need for right and left heart catheterization as well as why CT angiography may be less optimal in this setting necessitating catheter-based procedure.  Discussed the natural history of atrial fibrillation as well as incidence and exacerbating factors.  Reviewed risks and benefits of cardiac catheterization noted below.  INFORMED CONSENT: I have reviewed the risks, indications, and alternatives to cardiac catheterization, possible angioplasty, and stenting with the patient. Risks include but are not limited to bleeding, infection, vascular injury, stroke, myocardial infarction, arrhythmia, kidney injury, radiation-related injury in the case of prolonged fluoroscopy use, emergency cardiac surgery, and death. The patient understands the risks of serious complication is 1-2 in 7416 with diagnostic cardiac cath and 1-2% or less with angioplasty/stenting.   INFORMED CONSENT: I have reviewed the risks, indications, and alternatives to right heart catheterization with the patient. Risks include but are not limited to bleeding, infection, vascular injury, stroke, myocardial infarction, arrhythmia, kidney injury, radiation-related injury in the case of prolonged fluoroscopy use, emergency cardiac surgery, and death. The patient understands the risks of serious complication is 1-2 in 3845 with diagnostic cardiac cath, and is willing to proceed.  Patient and family demonstrate understanding and we participated in shared decision making.  We will pursue right and left heart catheterization tomorrow.  Xarelto is on hold and IV heparin has been initiated.  >30 minutes additional patient facing care.

## 2021-04-01 NOTE — Interval H&P Note (Signed)
History and Physical Interval Note:  04/01/2021 8:54 PM  [name]  has presented today for surgery, with the diagnosis of heart failure.  The various methods of treatment have been discussed with the patient and family. After consideration of risks, benefits and other options for treatment, the patient has consented to  Procedure(s): RIGHT/LEFT HEART CATH AND CORONARY ANGIOGRAPHY (N/A) as a surgical intervention.  The patient's history has been reviewed, patient examined, no change in status, stable for surgery.  I have reviewed the patient's chart and labs.  Questions were answered to the patient's satisfaction.     [name]

## 2021-04-01 NOTE — Progress Notes (Addendum)
Progress Note  Patient Name: [name] Date of Encounter: 04/01/2021  Primary Cardiologist: Sanda Klein, MD   Subjective   Feels well overall. Unsure if at baseline since she has not walked in halls.  Inpatient Medications    Scheduled Meds:  atorvastatin  80 mg Oral q1800   digoxin  0.125 mg Oral Daily   furosemide  40 mg Oral Daily   gabapentin  300 mg Oral BID   metoprolol tartrate  100 mg Oral BID   rivaroxaban  20 mg Oral Q supper   sodium chloride flush  3 mL Intravenous Q12H   spironolactone  12.5 mg Oral Daily   Continuous Infusions:  sodium chloride     PRN Meds: sodium chloride, acetaminophen, cyclobenzaprine, ondansetron (ZOFRAN) IV, sodium chloride flush   Vital Signs    Vitals:   03/31/21 1920 03/31/21 2320 04/01/21 0007 04/01/21 0325  BP: 138/90 115/86  109/83  Pulse: (!) 109 (!) 113 (!) 107 (!) 104  Resp: (!) 21 20 19 19   Temp: 98.3 F (36.8 C) 98.3 F (36.8 C)  98.3 F (36.8 C)  TempSrc: Oral Oral  Oral  SpO2: 98% 98% 95% 94%  Weight:      Height:        Intake/Output Summary (Last 24 hours) at 04/01/2021 0554 Last data filed at 03/31/2021 2324 Gross per 24 hour  Intake 723 ml  Output 1900 ml  Net -1177 ml   Filed Weights   03/29/21 0705 03/30/21 0300 03/31/21 0336  Weight: 93.5 kg 93.5 kg 92.9 kg    Telemetry    Afib rates 80s to 100s, PVCs - Personally Reviewed  ECG    7/14 - afib with normal rate 68, T wave inversions inferolateral, slightly more prominent than prior ECG on 06/15/20- Personally Reviewed  Physical Exam   GEN: No acute distress.   Neck: No JVD Cardiac: regular rhythm, normal rate, no murmurs, rubs, or gallops.  Respiratory: Clear to auscultation bilaterally. GI: Soft, nontender, non-distended  MS: No edema; No deformity. Neuro:  Nonfocal  Psych: Normal affect   Labs    Chemistry Recent Labs  Lab 03/30/21 0015 03/31/21 0018 04/01/21 0007  NA 137 137 136  K 3.8 3.8 3.7  CL 104 106 103  CO2  23 24 24   GLUCOSE 121* 104* 151*  BUN 11 10 11   CREATININE 0.90 0.83 1.08*  CALCIUM 8.4* 8.4* 8.7*  GFRNONAA >60 >60 >60  ANIONGAP 10 7 9      Hematology Recent Labs  Lab 03/28/21 0529 04/01/21 0007  WBC 7.8 10.4  RBC 4.81 4.84  HGB 12.2 12.5  HCT 40.5 39.6  MCV 84.2 81.8  MCH 25.4* 25.8*  MCHC 30.1 31.6  RDW 15.6* 15.7*  PLT 213 248    Cardiac EnzymesNo results for input(s): TROPONINI in the last 168 hours. No results for input(s): TROPIPOC in the last 168 hours.   BNP Recent Labs  Lab 03/28/21 0529  BNP 325.5*     DDimer No results for input(s): DDIMER in the last 168 hours.   Radiology    No results found.  Cardiac Studies   Echo: Personally reviewed - RV appears moderate-enlarged and mild-moderately reduced function.   1. Left ventricular ejection fraction, by estimation, is 25 to 30%. The  left ventricle has severely decreased function. The left ventricle  demonstrates global hypokinesis. There is mild left ventricular  hypertrophy. Left ventricular diastolic parameters   are indeterminate.   2. Right ventricular systolic  function is mildly reduced. The right  ventricular size is moderately enlarged. There is normal pulmonary artery  systolic pressure. The estimated right ventricular systolic pressure is  11.5 mmHg.   3. Left atrial size was mildly dilated.   4. Right atrial size was moderately dilated.   5. The mitral valve is normal in structure. Moderate mitral valve  regurgitation. No evidence of mitral stenosis.   6. Tricuspid valve regurgitation is severe. Suspect functional from  annular dilatation.   7. The aortic valve is tricuspid. Aortic valve regurgitation is mild. No  aortic stenosis is present.   8. The inferior vena cava is dilated in size with >50% respiratory  variability, suggesting right atrial pressure of 8 mmHg.   9. The patient was in atrial fibrillation.  10. Significant change in LV and RV function compared to prior echo.    Patient Profile     55 y.o. female with longstanding persistent atrial fibrillation, like permanent, previously well rate controlled, now presenting with rapid ventricular response and acute systolic heart failure with marked reduction in LVEF, OSA noncompliant w CPAP, HTN, DM, HLP, with dense coronary artery calcifications on CTA Chest this admission.  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 ICM given dense coronary artery calicifications in 3 vessel distribution noted on CTA chest performed to evaluate for PE on admission. I have independently reviewed these images. She appears euvolemic and has not been aggressively diuresed, will consider [facility] tomorrow. Discussed in detail with patient, she wants to speak to family before formally consenting - we will obtain this later today or tomorrow morning. CCTA less optimal choice given degree of CAC on CT and likely permanent AF.  Significant reflux of contrast into the IVC. I have independently reviewed the echocardiogram 03/28/21.  Cr: 1.53 > 1.21 > 0.90 > 0.83> 1.08 UOP yesterday: 2.3 L Weight: Unreliable but 93.5 > 92.9 kg (98 kg at office visit 06/15/20) Net negative for admission: -3.9 L Diuretic plan: furosemide 40 mg po daily (home dose is 20 mg prn) Heart Failure Therapy ACE-I/ARB/ARNI: will add Entresto after cath BB: on lopressor 100 mg BID. Will consider transition to Toprol. MRA: spironolactone 12.5 daily.  SGLT2I: will add Farxiga 10 mg daily - review with social work for patient assistance as needed.  -HOLD xarelto for cath tomorrow. Will start heparin given high CHADS2VASC  score and history of stroke off xarelto.  2. AFib:  - CHA2DS2-VASc 6 (history of stroke, female gender, DM, hypertension, CHF). Per Dr. Sallyanne Kuster primary cardiologist "It will be important to have some type of home ECG monitoring equipment to detect periods of RVR in the future.  She will be purchasing a Kardia device." - TSH is normal. - Uncontrolled OSA - will need to be compliant for best results. - on two agents at home, BB and CCB. CCB diltiazem stopped due ot low EF. Digoxin 0.125 mg daily started. On lopressor 100 mg Bid. - HOLD xarelto for cath, will use heparin pre cath.   3. OSA: Has not been compliant with treatment.  This may be contributing to be poorly controlled ventricular rates. Encouraged 100% compliance w CPAP.  4. DM: recent hemoglobin A1c 6.7%.  Would benefit from SGLT2 inhibitor such as [place] or [place]. Will discuss with social work to ensure affordability, will initiate today.   5. HTN: Well-controlled.  As outpatient she was also receiving diltiazem and hydrochlorothiazide, now just on beta-blockers plus loop diuretics. Spironolactone has been added for HF therapy. BP hypotensive to normal this admission.   6. HLD: atorvastatin 80 mg.  LDL 79, trig 71, HDL 18. She has CAC on CT, will review after cath and may need to consider PCSK9I as outpatient in close follow up.  7. AKI: Resolved. Spironolactone has been added already, on oral diuretics. She will likely need entresto, will start after cath.       For questions or updates, please contact [place] Please consult [email or web address] for contact info under      Signed, Elouise Munroe, MD  04/01/2021, 5:54 AM    >60 minutes spent reviewing chart, CTA, echo, prior notes, telemetry and ECGs. >50% of that time spent spent at the patient's bedside discussing plan of care and [facility] as well as management of HF and Afib.

## 2021-04-01 NOTE — Progress Notes (Signed)
ANTICOAGULATION CONSULT NOTE - Initial Consult  Pharmacy Consult for heparin Indication: atrial fibrillation  Allergies  Allergen Reactions   Penicillins Hives and Other (See Comments)    Unknown  Has patient had a PCN reaction causing immediate rash, facial/tongue/throat swelling, SOB or lightheadedness with hypotension: No Has patient had a PCN reaction causing severe rash involving mucus membranes or skin necrosis: No Has patient had a PCN reaction that required hospitalization No Has patient had a PCN reaction occurring within the last 10 years: No If all of the above answers are "NO", then may proceed with Cephalosporin use.    Patient Measurements: Height: 5\' 4"  (162.6 cm) Weight: 90.4 kg (199 lb 3.2 oz) IBW/kg (Calculated) : 54.7 Heparin Dosing Weight: 76.73 kg   Vital Signs: Temp: 98.3 F (36.8 C) (07/18 0736) Temp Source: Oral (07/18 0736) BP: 128/77 (07/18 0736) Pulse Rate: 102 (07/18 0736)  Labs: Recent Labs    03/30/21 0015 03/31/21 0018 04/01/21 0007  HGB  --   --  12.5  HCT  --   --  39.6  PLT  --   --  248  CREATININE 0.90 0.83 1.08*    Estimated Creatinine Clearance: 63.4 mL/min (A) (by C-G formula based on SCr of 1.08 mg/dL (H)).   Medical History: Past Medical History:  Diagnosis Date   Clotting disorder ([place])    on xarelto   Essential hypertension    Heart murmur    a. 10/2015 Echo: EF 60-65%, no rwma, mild AI/MR, sev dil LA/RA, PASP 56mmHg.   Noncompliance    NSVT (nonsustained ventricular tachycardia) ([place])    a. 10/2015 during admission for CVA/AF.   Persistent atrial fibrillation ([place])    a. CHA2DS2VASC = 4-->Xarelto;  b. 02/2016 Successful DCCV.  c. Recurrent fib   Pneumonia 2012 x 2; 2015   Stroke [facility])    a. 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   Transient ischemic attack (TIA) 01/2013    Medications:  Infusions:   sodium chloride       Assessment: 55 yo F with h/x of CVA on Xarelto PTA for AF (20 mg). No bolus d/t Xarelo dose last night. Start drip at 1200. Hgb stable at 12.5 Goal of Therapy:  Heparin level 0.3-0.7 units/ml aPTT 66-102 seconds Monitor platelets by anticoagulation protocol: Yes   Plan:  Start heparin infusion at 1150 units/hr (15unit/kg/hr) Check anti-Xa level & aPTT in 6 hours and daily while on heparin. Dose off of aPTT until anti-Xa level correlates. Continue to monitor H&H and platelets  Anderson Malta A Madalene Mickler 04/01/2021,10:10 AM

## 2021-04-01 NOTE — Progress Notes (Addendum)
PROGRESS NOTE    DI JASMER  QIH:[id number] DOB: 02-28-1966 DOA: 03/28/2021 PCP: [name], MD   Brief Narrative: 55 year old with past medical history significant for A. fib on anticoagulation, CVA/TIA, hypertension presents complaining of worsening shortness of breath, worse over the last 2 days prior to admission.  She reports a chronic palpitation from her A. fib.  She noticed worsening palpitation.  She reports 10 pounds weight gain and lower extremity edema.  Evaluation in the ED: Patient was found to be afebrile, heart rate 130s A. fib, blood pressure 112/87.  Chest x-ray with pulmonary vascular congestion, BNP 325.  CTA chest was negative for PE.  7 mm nodule in the right upper lobe.  Patient was admitted for heart failure exacerbation and A. fib with RVR.     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 fib with RVR, chest x-ray with pulmonary edema, elevated BNP. -2D echo showed severe tricuspid valve regurgitation and moderate mitral valve regurgitation.  ejection fraction decrease to 25 to 30% -Received IV Lasix in the ED. -On Lasix and spironolactone. -Follow cardiology recommendation in regards tricuspid or mitral valve regurgitation. Plan for follow up ECHO out patient after rate controlled.  -No candidate for cardiac CT while still in rapid  A fib.  -Cardiology planning start entresto/ after angiography.  -cardiology recommending Cath for tomorrow.   A fib; Continue with metoprolol., dose increase  7/16. Cardizem discontinued due to new lower ejection fraction Change Xarelto to heparin gtt in anticipation of Cath.  Started on digoxin 7/17.  HTN; Continue with metoprolol.  Hold HCTZ, now on lasix.   DM type 2:  On diet.  Not currently taking metformin.  HbA1c: 6.7 Started on Farxiga.   OSA: Continue with CPAP.   HLD:  Continue with Lipitor.   Hx CVA, TIA: Continue with statins,   Elevation troponin; cardiology following.     Estimated body mass index is 34.19 kg/m as calculated from the following:   Height as of this encounter: 5\' 4"  (1.626 m).   Weight as of this encounter: 90.4 kg.   DVT prophylaxis: Xarelto Code Status: Full code Family Communication: care discussed with patient Disposition Plan:  Status is: Inpatient  Remains inpatient appropriate because:IV treatments appropriate due to intensity of illness or inability to take PO  Dispo: The patient is from: Home              Anticipated d/c is to: Home              Patient currently is not medically stable to d/c.   Difficult to place patient No        Consultants:  Cardiology   Procedures:  ECHO; EF 25 %  Antimicrobials:    Subjective: She is doing ok. She would like for cardiologist to discuss with family about cath.  She denies chest pain.   Objective: Vitals:   04/01/21 0007 04/01/21 0325 04/01/21 0608 04/01/21 0736  BP:  109/83  128/77  Pulse: (!) 107 (!) 104 (!) 107 (!) 102  Resp: 19 19 15  14  Temp:  98.3 F (36.8 C)  98.3 F (36.8 C)  TempSrc:  Oral  Oral  SpO2: 95% 94% 98% 96%  Weight:   90.4 kg   Height:        Intake/Output Summary (Last 24 hours) at 04/01/2021 1024 Last data filed at 04/01/2021 0801 Gross per 24 hour  Intake 763 ml  Output 1600 ml  Net -837 ml    Filed Weights   03/30/21 0300 03/31/21 0336 04/01/21 0608  Weight: 93.5 kg 92.9 kg 90.4 kg    Examination:  General exam: NAD Respiratory system: CTA Cardiovascular system: S 1, S 2  RRR Gastrointestinal system: BS present, soft, nt Central nervous system: alert Extremities: No edema  Data Reviewed: I have personally reviewed following labs and imaging studies  CBC: Recent Labs  Lab 03/28/21 0529 04/01/21 0007  WBC 7.8 10.4  NEUTROABS 5.3  --   HGB 12.2 12.5  HCT 40.5 39.6  MCV 84.2 81.8  PLT 213 400    Basic Metabolic Panel: Recent Labs  Lab 03/28/21 0916 03/29/21 0541 03/29/21 0902 03/30/21 0015 03/31/21 0018 04/01/21 0007  NA  --  136 136 137 137 136  K  --  5.2* 3.9 3.8 3.8 3.7  CL  --  107 106 104 106 103  CO2  --  18* 21* 23 24 24   GLUCOSE  --  116* 142* 121* 104* 151*  BUN  --  20 16 11 10 11   CREATININE  --  1.53* 1.21* 0.90 0.83 1.08*  CALCIUM  --  8.9 8.9 8.4* 8.4* 8.7*  MG 2.1 2.1  --   --   --   --   PHOS  --  4.6  --   --   --   --     GFR: Estimated Creatinine Clearance: 63.4 mL/min (A) (by C-G formula based on SCr of 1.08 mg/dL (H)). Liver Function Tests: No results for input(s): AST, ALT, ALKPHOS, BILITOT, PROT, ALBUMIN in the last 168 hours. No results for input(s): LIPASE, AMYLASE in the last 168 hours. No results for input(s): AMMONIA in the last 168 hours. Coagulation Profile: No results for input(s): INR, PROTIME in the last 168 hours. Cardiac Enzymes: No results for input(s): CKTOTAL, CKMB, CKMBINDEX, TROPONINI in the last 168 hours. BNP (last 3 results) No results for input(s): PROBNP in the last 8760 hours. HbA1C: No results for input(s): HGBA1C in the last 72 hours.  CBG: Recent Labs  Lab 03/31/21 1134  GLUCAP 106*    Lipid Profile: No results for input(s): CHOL, HDL, LDLCALC, TRIG, CHOLHDL, LDLDIRECT in the last 72 hours.  Thyroid Function Tests: No results for input(s): TSH, T4TOTAL, FREET4, T3FREE, THYROIDAB in the last 72 hours.  Anemia Panel: No results for input(s): VITAMINB12, FOLATE, FERRITIN, TIBC, IRON, RETICCTPCT in the last 72 hours. Sepsis Labs: No results for input(s): PROCALCITON,  LATICACIDVEN in the last 168 hours.  Recent Results (from the past 240 hour(s))  Resp Panel by RT-PCR (Flu A&B, Covid) Nasopharyngeal Swab     Status: None   Collection Time: 03/28/21  7:48 AM   Specimen: Nasopharyngeal Swab; Nasopharyngeal(NP) swabs in vial transport medium  Result Value Ref Range Status   SARS Coronavirus 2 by RT PCR NEGATIVE NEGATIVE Final    Comment: (NOTE) SARS-CoV-2 target nucleic acids are NOT DETECTED.  The SARS-CoV-2 RNA is generally detectable in upper respiratory specimens during the acute phase of infection. The lowest concentration of SARS-CoV-2 viral copies this assay can detect is  138 copies/mL. A negative result does not preclude SARS-Cov-2 infection and should not be used as the sole basis for treatment or other patient management decisions. A negative result may occur with  improper specimen collection/handling, submission of specimen other than nasopharyngeal swab, presence of viral mutation(s) within the areas targeted by this assay, and inadequate number of viral copies(<138 copies/mL). A negative result must be combined with clinical observations, patient history, and epidemiological information. The expected result is Negative.  Fact Sheet for Patients:  EntrepreneurPulse.com.au  Fact Sheet for Healthcare Providers:  IncredibleEmployment.be  This test is no t yet approved or cleared by the [place] FDA and  has been authorized for detection and/or diagnosis of SARS-CoV-2 by FDA under an Emergency Use Authorization (EUA). This EUA will remain  in effect (meaning this test can be used) for the duration of the COVID-19 declaration under Section 564(b)(1) of the Act, 21 U.S.C.section 360bbb-3(b)(1), unless the authorization is terminated  or revoked sooner.       Influenza A by PCR NEGATIVE NEGATIVE Final   Influenza B by PCR NEGATIVE NEGATIVE Final    Comment: (NOTE) The Xpert Xpress  SARS-CoV-2/FLU/RSV plus assay is intended as an aid in the diagnosis of influenza from Nasopharyngeal swab specimens and should not be used as a sole basis for treatment. Nasal washings and aspirates are unacceptable for Xpert Xpress SARS-CoV-2/FLU/RSV testing.  Fact Sheet for Patients: EntrepreneurPulse.com.au  Fact Sheet for Healthcare Providers: IncredibleEmployment.be  This test is not yet approved or cleared by the [place] FDA and has been authorized for detection and/or diagnosis of SARS-CoV-2 by FDA under an Emergency Use Authorization (EUA). This EUA will remain in effect (meaning this test can be used) for the duration of the COVID-19 declaration under Section 564(b)(1) of the Act, 21 U.S.C. section 360bbb-3(b)(1), unless the authorization is terminated or revoked.  Performed at [facility], [place] [address] [place], [place] [address]   MRSA Next Gen by PCR, Nasal     Status: None   Collection Time: 03/28/21 11:05 AM   Specimen: Nasal Mucosa; Nasal Swab  Result Value Ref Range Status   MRSA by PCR Next Gen NOT DETECTED NOT DETECTED Final    Comment: (NOTE) The GeneXpert MRSA Assay (FDA approved for NASAL specimens only), is one component of a comprehensive MRSA colonization surveillance program. It is not intended to diagnose MRSA infection nor to guide or monitor treatment for MRSA infections. Test performance is not FDA approved in patients less than 15 years old. Performed at [facility], [place] [address] [place], [place] [address]           Radiology Studies: No results found.      Scheduled Meds:  atorvastatin  80 mg Oral q1800   dapagliflozin propanediol  10 mg Oral Daily   digoxin  0.125 mg Oral Daily   furosemide  40 mg Oral Daily   gabapentin  300 mg Oral BID   metoprolol tartrate  100 mg Oral BID   sodium chloride flush  3 mL Intravenous Q12H   spironolactone  12.5 mg Oral Daily    Continuous Infusions:  sodium chloride       LOS: 4 days    Time spent: 35 minutes.     Elmarie Shiley, MD [facility]   If 7PM-7AM, please contact night-coverage [email or web address]  04/01/2021, 10:24 AM

## 2021-04-01 NOTE — Progress Notes (Signed)
ANTICOAGULATION CONSULT NOTE -  Consult  Pharmacy Consult for heparin Indication: atrial fibrillation  Allergies  Allergen Reactions   Penicillins Hives and Other (See Comments)    Unknown  Has patient had a PCN reaction causing immediate rash, facial/tongue/throat swelling, SOB or lightheadedness with hypotension: No Has patient had a PCN reaction causing severe rash involving mucus membranes or skin necrosis: No Has patient had a PCN reaction that required hospitalization No Has patient had a PCN reaction occurring within the last 10 years: No If all of the above answers are "NO", then may proceed with Cephalosporin use.    Patient Measurements: Height: 5\' 4"  (162.6 cm) Weight: 90.4 kg (199 lb 3.2 oz) IBW/kg (Calculated) : 54.7 Heparin Dosing Weight: 76.73 kg   Vital Signs: Temp: 97.9 F (36.6 C) (07/18 1900) Temp Source: Oral (07/18 1900) BP: 114/87 (07/18 1900) Pulse Rate: 97 (07/18 1900)  Labs: Recent Labs    03/30/21 0015 03/31/21 0018 04/01/21 0007 04/01/21 1801  HGB  --   --  12.5  --   HCT  --   --  39.6  --   PLT  --   --  248  --   APTT  --   --   --  36  HEPARINUNFRC  --   --   --  >1.10*  CREATININE 0.90 0.83 1.08*  --      Estimated Creatinine Clearance: 63.4 mL/min (A) (by C-G formula based on SCr of 1.08 mg/dL (H)).   Medical History: Past Medical History:  Diagnosis Date   Clotting disorder ([place])    on xarelto   Essential hypertension    Heart murmur    a. 10/2015 Echo: EF 60-65%, no rwma, mild AI/MR, sev dil LA/RA, PASP 69mmHg.   Noncompliance    NSVT (nonsustained ventricular tachycardia) ([place])    a. 10/2015 during admission for CVA/AF.   Persistent atrial fibrillation ([place])    a. CHA2DS2VASC = 4-->Xarelto;  b. 02/2016 Successful DCCV.  c. Recurrent fib   Pneumonia 2012 x 2; 2015   Stroke [facility])    a. 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   Transient  ischemic attack (TIA) 01/2013    Medications:  Infusions:   sodium chloride     heparin 1,150 Units/hr (04/01/21 1900)    Assessment: 55 yo F with h/x of CVA on Xarelto PTA for AF (20 mg). No bolus d/t Xarelo dose last night.   Heparin drip started at 1150 units/hr. aPTT came back at 36 secs (below goal) HL high secondary to Xarelto. Hgb stable at 12.5 Goal of Therapy:  Heparin level 0.3-0.7 units/ml aPTT 66-102 seconds Monitor platelets by anticoagulation protocol: Yes   Plan:  Increase heparin infusion at 1400 units/hr  Check anti-Xa level & aPTT in 6 hours and daily while on heparin. Dose off of aPTT until anti-Xa level correlates. Continue to monitor H&H and platelets  Alanda Slim, PharmD, [facility] Clinical Pharmacist Please see AMION for all Pharmacists' Contact Phone Numbers 04/01/2021, 8:24 PM

## 2021-04-02 ENCOUNTER — Encounter (HOSPITAL_COMMUNITY)
Admission: EM | Disposition: A | Discharge status: Home / Self Care | Payer: Self-pay | Source: Home / Self Care | Attending: Internal Medicine

## 2021-04-02 ENCOUNTER — Encounter (HOSPITAL_COMMUNITY): Payer: Self-pay | Admitting: Interventional Cardiology

## 2021-04-02 ENCOUNTER — Other Ambulatory Visit (HOSPITAL_COMMUNITY): Payer: Self-pay

## 2021-04-02 DIAGNOSIS — I5041 Acute combined systolic (congestive) and diastolic (congestive) heart failure: Secondary | ICD-10-CM | POA: Diagnosis not present

## 2021-04-02 DIAGNOSIS — I4891 Unspecified atrial fibrillation: Secondary | ICD-10-CM | POA: Diagnosis not present

## 2021-04-02 DIAGNOSIS — I2510 Atherosclerotic heart disease of native coronary artery without angina pectoris: Secondary | ICD-10-CM

## 2021-04-02 DIAGNOSIS — I5023 Acute on chronic systolic (congestive) heart failure: Secondary | ICD-10-CM | POA: Diagnosis not present

## 2021-04-02 HISTORY — PX: RIGHT/LEFT HEART CATH AND CORONARY ANGIOGRAPHY: CATH118266

## 2021-04-02 LAB — HEPARIN LEVEL (UNFRACTIONATED): Heparin Unfractionated: >1.1 IU/mL — ABNORMAL HIGH (ref 0.30–0.70)

## 2021-04-02 LAB — PROTIME-INR
INR: 1.5 — ABNORMAL HIGH (ref 0.8–1.2)
Prothrombin Time: 18.5 seconds — ABNORMAL HIGH (ref 11.4–15.2)

## 2021-04-02 LAB — POCT I-STAT EG7
Acid-Base Excess: 0 mmol/L (ref 0.0–2.0)
Acid-Base Excess: 0 mmol/L (ref 0.0–2.0)
Bicarbonate: 26.6 mmol/L (ref 20.0–28.0)
Bicarbonate: 26.7 mmol/L (ref 20.0–28.0)
Calcium, Ion: 1.21 mmol/L (ref 1.15–1.40)
Calcium, Ion: 1.24 mmol/L (ref 1.15–1.40)
HCT: 40 % (ref 36.0–46.0)
HCT: 40 % (ref 36.0–46.0)
Hemoglobin: 13.6 g/dL (ref 12.0–15.0)
Hemoglobin: 13.6 g/dL (ref 12.0–15.0)
O2 Saturation: 71 %
O2 Saturation: 73 %
Potassium: 3.8 mmol/L (ref 3.5–5.1)
Potassium: 3.8 mmol/L (ref 3.5–5.1)
Sodium: 143 mmol/L (ref 135–145)
Sodium: 143 mmol/L (ref 135–145)
TCO2: 28 mmol/L (ref 22–32)
TCO2: 28 mmol/L (ref 22–32)
pCO2, Ven: 47.8 mmHg (ref 44.0–60.0)
pCO2, Ven: 49 mmHg (ref 44.0–60.0)
pH, Ven: 7.342 (ref 7.250–7.430)
pH, Ven: 7.354 (ref 7.250–7.430)
pO2, Ven: 40 mmHg (ref 32.0–45.0)
pO2, Ven: 41 mmHg (ref 32.0–45.0)

## 2021-04-02 LAB — BASIC METABOLIC PANEL
Anion gap: 7 (ref 5–15)
BUN: 9 mg/dL (ref 6–20)
CO2: 28 mmol/L (ref 22–32)
Calcium: 9 mg/dL (ref 8.9–10.3)
Chloride: 103 mmol/L (ref 98–111)
Creatinine, Ser: 0.94 mg/dL (ref 0.44–1.00)
GFR, Estimated: >60 mL/min (ref >60)
Glucose, Bld: 91 mg/dL (ref 70–99)
Potassium: 3.4 mmol/L — ABNORMAL LOW (ref 3.5–5.1)
Sodium: 138 mmol/L (ref 135–145)

## 2021-04-02 LAB — POCT I-STAT 7, (LYTES, BLD GAS, ICA,H+H)
Acid-base deficit: 1 mmol/L (ref 0.0–2.0)
Bicarbonate: 25.1 mmol/L (ref 20.0–28.0)
Calcium, Ion: 1.23 mmol/L (ref 1.15–1.40)
HCT: 39 % (ref 36.0–46.0)
Hemoglobin: 13.3 g/dL (ref 12.0–15.0)
O2 Saturation: 99 %
Potassium: 3.7 mmol/L (ref 3.5–5.1)
Sodium: 139 mmol/L (ref 135–145)
TCO2: 26 mmol/L (ref 22–32)
pCO2 arterial: 47.3 mmHg (ref 32.0–48.0)
pH, Arterial: 7.333 — ABNORMAL LOW (ref 7.350–7.450)
pO2, Arterial: 132 mmHg — ABNORMAL HIGH (ref 83.0–108.0)

## 2021-04-02 LAB — CBC
HCT: 41 % (ref 36.0–46.0)
Hemoglobin: 12.5 g/dL (ref 12.0–15.0)
MCH: 25.1 pg — ABNORMAL LOW (ref 26.0–34.0)
MCHC: 30.5 g/dL (ref 30.0–36.0)
MCV: 82.2 fL (ref 80.0–100.0)
Platelets: 295 10*3/uL (ref 150–400)
RBC: 4.99 MIL/uL (ref 3.87–5.11)
RDW: 15.5 % (ref 11.5–15.5)
WBC: 8.8 10*3/uL (ref 4.0–10.5)
nRBC: 0 % (ref 0.0–0.2)

## 2021-04-02 LAB — APTT: aPTT: 66 seconds — ABNORMAL HIGH (ref 24–36)

## 2021-04-02 SURGERY — RIGHT/LEFT HEART CATH AND CORONARY ANGIOGRAPHY
Anesthesia: LOCAL

## 2021-04-02 MED ORDER — MIDAZOLAM HCL 2 MG/2ML IJ SOLN
INTRAMUSCULAR | Status: DC | PRN
  Administered 2021-04-02: 0.5 mg via INTRAVENOUS
  Administered 2021-04-02: 1 mg via INTRAVENOUS

## 2021-04-02 MED ORDER — ASPIRIN 81 MG PO CHEW
81.0000 mg | CHEWABLE_TABLET | ORAL | Status: AC
  Administered 2021-04-02: 81 mg via ORAL
  Filled 2021-04-02: qty 1

## 2021-04-02 MED ORDER — POTASSIUM CHLORIDE CRYS ER 20 MEQ PO TBCR
40.0000 meq | EXTENDED_RELEASE_TABLET | Freq: Once | ORAL | Status: AC
  Administered 2021-04-02: 40 meq via ORAL
  Filled 2021-04-02 (×2): qty 2

## 2021-04-02 MED ORDER — FENTANYL CITRATE (PF) 100 MCG/2ML IJ SOLN
INTRAMUSCULAR | Status: DC | PRN
  Administered 2021-04-02 (×2): 25 ug via INTRAVENOUS

## 2021-04-02 MED ORDER — ACETAMINOPHEN 325 MG PO TABS: 650.0000 mg | ORAL_TABLET | ORAL | Status: DC | PRN

## 2021-04-02 MED ORDER — ASPIRIN 81 MG PO CHEW: 81.0000 mg | CHEWABLE_TABLET | ORAL | Status: DC

## 2021-04-02 MED ORDER — SODIUM CHLORIDE 0.9 % IV SOLN: INTRAVENOUS | Status: AC

## 2021-04-02 MED ORDER — HEPARIN (PORCINE) IN NACL 1000-0.9 UT/500ML-% IV SOLN
INTRAVENOUS | Status: AC
  Filled 2021-04-02: qty 1000

## 2021-04-02 MED ORDER — VERAPAMIL HCL 2.5 MG/ML IV SOLN
INTRAVENOUS | Status: DC | PRN
  Administered 2021-04-02: 10 mL via INTRA_ARTERIAL

## 2021-04-02 MED ORDER — ATORVASTATIN CALCIUM 80 MG PO TABS: 80.0000 mg | ORAL_TABLET | Freq: Every day | ORAL | Status: DC

## 2021-04-02 MED ORDER — IOHEXOL 350 MG/ML SOLN
INTRAVENOUS | Status: DC | PRN
  Administered 2021-04-02: 80 mL via INTRA_ARTERIAL

## 2021-04-02 MED ORDER — SODIUM CHLORIDE 0.9% FLUSH: 3.0000 mL | INTRAVENOUS | Status: DC | PRN

## 2021-04-02 MED ORDER — POTASSIUM CHLORIDE CRYS ER 20 MEQ PO TBCR
40.0000 meq | EXTENDED_RELEASE_TABLET | Freq: Once | ORAL | Status: AC
  Administered 2021-04-02: 40 meq via ORAL
  Filled 2021-04-02: qty 2

## 2021-04-02 MED ORDER — SODIUM CHLORIDE 0.9 % IV SOLN: INTRAVENOUS | Status: DC

## 2021-04-02 MED ORDER — ONDANSETRON HCL 4 MG/2ML IJ SOLN: 4.0000 mg | Freq: Four times a day (QID) | INTRAMUSCULAR | Status: DC | PRN

## 2021-04-02 MED ORDER — LIDOCAINE HCL (PF) 1 % IJ SOLN
INTRAMUSCULAR | Status: AC
  Filled 2021-04-02: qty 30

## 2021-04-02 MED ORDER — MIDAZOLAM HCL 2 MG/2ML IJ SOLN
INTRAMUSCULAR | Status: AC
  Filled 2021-04-02: qty 2

## 2021-04-02 MED ORDER — SODIUM CHLORIDE 0.9 % WEIGHT BASED INFUSION
1.0000 mL/kg/h | INTRAVENOUS | Status: DC
  Administered 2021-04-02: 1 mL/kg/h via INTRAVENOUS

## 2021-04-02 MED ORDER — OXYCODONE HCL 5 MG PO TABS: 5.0000 mg | ORAL_TABLET | ORAL | Status: DC | PRN

## 2021-04-02 MED ORDER — HYDRALAZINE HCL 20 MG/ML IJ SOLN: 10.0000 mg | INTRAMUSCULAR | Status: AC | PRN

## 2021-04-02 MED ORDER — HEPARIN SODIUM (PORCINE) 1000 UNIT/ML IJ SOLN
INTRAMUSCULAR | Status: AC
  Filled 2021-04-02: qty 1

## 2021-04-02 MED ORDER — NITROGLYCERIN 1 MG/10 ML FOR IR/CATH LAB
INTRA_ARTERIAL | Status: DC | PRN
  Administered 2021-04-02: 200 ug via INTRACORONARY

## 2021-04-02 MED ORDER — METOPROLOL SUCCINATE ER 100 MG PO TB24
100.0000 mg | ORAL_TABLET | Freq: Two times a day (BID) | ORAL | Status: DC
  Administered 2021-04-02 – 2021-04-03 (×3): 100 mg via ORAL
  Filled 2021-04-02 (×3): qty 1

## 2021-04-02 MED ORDER — HEPARIN (PORCINE) IN NACL 1000-0.9 UT/500ML-% IV SOLN
INTRAVENOUS | Status: DC | PRN
  Administered 2021-04-02 (×2): 500 mL

## 2021-04-02 MED ORDER — SACUBITRIL-VALSARTAN 24-26 MG PO TABS
1.0000 | ORAL_TABLET | Freq: Two times a day (BID) | ORAL | Status: DC
  Administered 2021-04-03 – 2021-04-04 (×3): 1 via ORAL
  Filled 2021-04-02 (×3): qty 1

## 2021-04-02 MED ORDER — FENTANYL CITRATE (PF) 100 MCG/2ML IJ SOLN
INTRAMUSCULAR | Status: AC
  Filled 2021-04-02: qty 2

## 2021-04-02 MED ORDER — SODIUM CHLORIDE 0.9 % WEIGHT BASED INFUSION
1.0000 mL/kg/h | INTRAVENOUS | Status: DC
Start: 2021-04-03 — End: 2021-04-02

## 2021-04-02 MED ORDER — SODIUM CHLORIDE 0.9 % IV SOLN: 250.0000 mL | INTRAVENOUS | Status: DC | PRN

## 2021-04-02 MED ORDER — VERAPAMIL HCL 2.5 MG/ML IV SOLN
INTRAVENOUS | Status: AC
  Filled 2021-04-02: qty 2

## 2021-04-02 MED ORDER — SODIUM CHLORIDE 0.9% FLUSH
3.0000 mL | Freq: Two times a day (BID) | INTRAVENOUS | Status: DC
  Administered 2021-04-02 – 2021-04-04 (×5): 3 mL via INTRAVENOUS

## 2021-04-02 MED ORDER — RIVAROXABAN 20 MG PO TABS
20.0000 mg | ORAL_TABLET | Freq: Every day | ORAL | Status: DC
  Administered 2021-04-02 – 2021-04-04 (×3): 20 mg via ORAL
  Filled 2021-04-02 (×3): qty 1

## 2021-04-02 MED ORDER — HEPARIN SODIUM (PORCINE) 1000 UNIT/ML IJ SOLN
INTRAMUSCULAR | Status: DC | PRN
  Administered 2021-04-02: 4500 [IU] via INTRAVENOUS

## 2021-04-02 MED ORDER — SODIUM CHLORIDE 0.9 % WEIGHT BASED INFUSION
3.0000 mL/kg/h | INTRAVENOUS | Status: DC
  Administered 2021-04-02: 3 mL/kg/h via INTRAVENOUS

## 2021-04-02 MED ORDER — SODIUM CHLORIDE 0.9 % WEIGHT BASED INFUSION
3.0000 mL/kg/h | INTRAVENOUS | Status: DC
Start: 2021-04-03 — End: 2021-04-02

## 2021-04-02 MED ORDER — LIDOCAINE HCL (PF) 1 % IJ SOLN
INTRAMUSCULAR | Status: DC | PRN
  Administered 2021-04-02 (×2): 2 mL via SUBCUTANEOUS

## 2021-04-02 MED ORDER — LABETALOL HCL 5 MG/ML IV SOLN: 10.0000 mg | INTRAVENOUS | Status: AC | PRN

## 2021-04-02 MED ORDER — NITROGLYCERIN 1 MG/10 ML FOR IR/CATH LAB
INTRA_ARTERIAL | Status: AC
  Filled 2021-04-02: qty 10

## 2021-04-02 SURGICAL SUPPLY — 10 items
CATH 5FR JL3.5 JR4 ANG PIG MP (CATHETERS) ×1 IMPLANT
CATH BALLN WEDGE 5F 110CM (CATHETERS) ×1 IMPLANT
DEVICE RAD COMP TR BAND LRG (VASCULAR PRODUCTS) ×1 IMPLANT
GLIDESHEATH SLEND A-KIT 6F 22G (SHEATH) ×1 IMPLANT
KIT HEART LEFT (KITS) ×2 IMPLANT
PACK CARDIAC CATHETERIZATION (CUSTOM PROCEDURE TRAY) ×2 IMPLANT
SHEATH GLIDE SLENDER 4/5FR (SHEATH) ×1 IMPLANT
SHEATH PROBE COVER 6X72 (BAG) ×1 IMPLANT
TRANSDUCER W/STOPCOCK (MISCELLANEOUS) ×2 IMPLANT
TUBING CIL FLEX 10 FLL-RA (TUBING) ×2 IMPLANT

## 2021-04-02 NOTE — Evaluation (Signed)
Physical Therapy Evaluation/ Discharge Patient Details Name: [name] MRN: [id number] DOB: Feb 05, 1966 Today's Date: 04/02/2021   History of Present Illness  55 y/o female admitted 7/14 with SOB, heart palpitations when laying on L, and LE edema. 7/14: ECHO with EF 25-30% as well as Afib with RVR. 7/19: R/L heart cath and angiogram. PMH: A fib, CIA/TIA, HTN, chronic heart palpitations  Clinical Impression  Pt s/p cardiac cath with radial access educated for RUE restrictions as present as well as walking program for D/c. Pt reports not being extremely active at baseline and working in [employer]. Pt states she understands walking program and importance for overall health and is committed to improving and may even join the [facility]. Pt at baseline functional level without further acute therapy needs at this time. Pt aware and agreeable, will sign off.     Follow Up Recommendations No PT follow up    Equipment Recommendations  None recommended by PT    Recommendations for Other Services       Precautions / Restrictions Precautions Precautions: Other (comment) Precaution Comments: watch HR, RT radial cath limit pushing and no lifting      Mobility  Bed Mobility Overal bed mobility: Modified Independent                  Transfers Overall transfer level: Modified independent                  Ambulation/Gait Ambulation/Gait assistance: Supervision Gait Distance (Feet): 250 Feet Assistive device: None;IV Pole Gait Pattern/deviations: WFL(Within Functional Limits)   Gait velocity interpretation: >2.62 ft/sec, indicative of community ambulatory General Gait Details: pt walked 200' with IV pole and last 5' without significant change in speed or balance. HR 103-110  Stairs            Wheelchair Mobility    Modified Rankin (Stroke Patients Only)       Balance Overall balance assessment: No apparent balance deficits (not formally assessed)                                            Pertinent Vitals/Pain Pain Assessment: No/denies pain    Home Living Family/patient expects to be discharged to:: Private residence Living Arrangements: Children Available Help at Discharge: Family;Available 24 hours/day Type of Home: House Home Access: Stairs to enter   [employer] of Steps: 2 Home Layout: One level Home Equipment: None      Prior Function Level of Independence: Independent         Comments: 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     Journalist, newspaper        Extremity/Trunk Assessment   Upper Extremity Assessment Upper Extremity Assessment: Overall WFL for tasks assessed    Lower Extremity Assessment Lower Extremity Assessment: Overall WFL for tasks assessed    Cervical / Trunk Assessment Cervical / Trunk Assessment: Normal  Communication   Communication: No difficulties  Cognition Arousal/Alertness: Awake/alert Behavior During Therapy: WFL for tasks assessed/performed Overall Cognitive Status: Within Functional Limits for tasks assessed                                        General Comments      Exercises     Assessment/Plan  PT Assessment Patent does not need any further PT services  PT Problem List         PT Treatment Interventions      PT Goals (Current goals can be found in the Care Plan section)  Acute Rehab PT Goals PT Goal Formulation: All assessment and education complete, DC therapy    Frequency     Barriers to discharge        Co-evaluation               AM-PAC PT "6 Clicks" Mobility  Outcome Measure Help needed turning from your back to your side while in a flat bed without using bedrails?: None Help needed moving from lying on your back to sitting on the side of a flat bed without using bedrails?: None Help needed moving to and from a bed to a chair (including a wheelchair)?: None Help needed standing up from a chair using your  arms (e.g., wheelchair or bedside chair)?: None Help needed to walk in hospital room?: None Help needed climbing 3-5 steps with a railing? : None 6 Click Score: 24    End of Session   Activity Tolerance: Patient tolerated treatment well Patient left: in chair;with call bell/phone within reach Nurse Communication: Mobility status PT Visit Diagnosis: Other abnormalities of gait and mobility (R26.89)    Time: 3552-1747 PT Time Calculation (min) (ACUTE ONLY): 16 min   Charges:   PT Evaluation $PT Eval Low Complexity: [place], PT Acute Rehabilitation Services Pager: [phone] Office: Remer B Tandre Conly 04/02/2021, 2:19 PM

## 2021-04-02 NOTE — Progress Notes (Signed)
Heart Failure Stewardship Pharmacist Progress Note   PCP: [name], MD PCP-Cardiologist: Sanda Klein, MD    HPI:  55 yo F with PMH of afib, CHF, CVA, OSA, HTN, and T2DM. She presented to the ED on 03/28/21 with shortness of breath, orthopnea, and LE edema. She was not taking her PRN furosemide PTA due to not knowing when to appropriately take it. CXR was done and showed cardiomegaly and baseline pulmonary vascular congestion without overt edema. An ECHO was done on 7/14 and LVEF is now reduced to 25-30% (down from 60-65% in 04/2019) with mild LVH and mildly reduced RV systolic function. Also found to be in afib RVR.  Current HF Medications: Furosemide 40 mg daily Metoprolol XL 100 mg BID Entresto 24/26 mg BID Spironolactone 12.5 mg daily Farxiga 10 mg daily Digoxin 0.125 mg daily  Prior to admission HF Medications: Furosemide 20 mg daily PRN Metoprolol tartrate 25 mg BID  Pertinent Lab Values: Serum creatinine 0.94, BUN 9, Potassium 3.4, Sodium 138, BNP 325.5, Magnesium 2.1, Digoxin level due around 7/21-7/22  Vital Signs: Weight: 197 lbs (admission weight: 206 lbs)  Blood pressure: 130-140/80s Heart rate: 90s   Medication Assistance / Insurance Benefits Check: Does the patient have prescription insurance? Yes Type of insurance plan: [place] Medicaid  Outpatient Pharmacy:  Prior to admission outpatient pharmacy: [facility] Is the patient willing to use [facility] at discharge? Yes    Assessment: 1. Acute on chronic systolic CHF (EF 25-05%), pending ischemic evaluation. NYHA class III symptoms. - Continue furosemide 40 mg daily - Agree with transitioning to metoprolol XL - can consolidate to 200 mg once daily - Agree with starting Entresto 24/26 mg BID  - Continue spironolactone 12.5 mg daily - Continue Farxiga 10 mg daily - Continue digoxin 0.125 mg daily. Level due end of this week - Agree with discontinuing diltiazem with new reduced EF    Plan: 1) Medication changes recommended at this time: - Agree with changes as above; consider changing metoprolol XL to 200 mg daily instead of 100 mg BID  2) Patient assistance: - Prior authorization required for Farxiga and Entresto - both are in process - 30-day free copay cards can be used if prior authorization approval is not granted until after discharge - HF TOC appt made for 7/26  3)  Education  - To be completed prior to discharge  Kerby Nora, PharmD, BCPS Heart Failure Stewardship Pharmacist Phone [phone]

## 2021-04-02 NOTE — Progress Notes (Signed)
Transported to the cath. Lab by bed stable. 

## 2021-04-02 NOTE — CV Procedure (Signed)
50 to 60% mid LAD 70 to 80% distal circumflex Mild pulmonary hypertension with mean capillary wedge pressure 16 mmHg.  Mean PA pressure 31 mmHg.  Mean right atrial pressure 13 mmHg. Pulmonary artery oximetry 73%, cardiac output 5.9 L/min and cardiac index 3.01 L/min/m and pulmonary vascular resistance 2.6 [place] units

## 2021-04-02 NOTE — Progress Notes (Signed)
PROGRESS NOTE    [name]  DVV:[id number] DOB: [birth year] DOA: 03/28/2021 PCP: [name], MD   Brief Narrative: 55 year old with past medical history significant for A. fib on anticoagulation, CVA/TIA, hypertension presents complaining of worsening shortness of breath, worse over the last 2 days prior to admission.  She reports a chronic palpitation from her A. fib.  She noticed worsening palpitation.  She reports 10 pounds weight gain and lower extremity edema.  Evaluation in the ED: Patient was found to be afebrile, heart rate 130s A. fib, blood pressure 112/87.  Chest x-ray with pulmonary vascular congestion, BNP 325.  CTA chest was negative for PE.  7 mm nodule in the right upper lobe.  Patient was admitted for heart failure exacerbation and A. fib with RVR.     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 fib with RVR, chest x-ray with pulmonary edema, elevated BNP. -2D echo showed severe tricuspid valve regurgitation and moderate mitral valve regurgitation.  ejection fraction decrease to 25 to 30% -Received IV Lasix in the ED. -On Lasix and spironolactone. -Follow cardiology recommendation in regards tricuspid or mitral valve regurgitation. Plan for follow up ECHO out patient after rate controlled.  -No candidate for cardiac CT while still in rapid  A fib.  -Cardiology planning start entresto/ after angiography.  -Underwent [place] cath: 50 to 60 % mid LAD, 70 to 80 % distal circumflex, plan for medical management.  On  lipitor, Farxiga, metoprolol   A fib; Continue with metoprolol., dose increase 7/16. Cardizem discontinued due to new lower ejection fraction Xarelto resume post cath.  Started on digoxin 7/17.  HTN; Continue with metoprolol.  Hold HCTZ, now on lasix.   DM type 2:  On diet.  Not currently taking metformin.  HbA1c: 6.7 Started on Farxiga.   OSA: Continue with CPAP.   HLD:  Continue with Lipitor.   Hx CVA, TIA: Continue with statins,   Elevation troponin; cardiology following.     Estimated body mass index is 33.91 kg/m as calculated from the following:   Height as of this encounter: 5\' 4"  (1.626 m).   Weight as of this encounter: 89.6 kg.   DVT prophylaxis: Xarelto Code Status: Full code Family Communication: care discussed with patient Disposition Plan:  Status is: Inpatient  Remains inpatient appropriate because:IV treatments appropriate due to intensity of illness or inability to take PO  Dispo: The patient is from: Home              Anticipated d/c is to: Home              Patient currently is not medically stable to d/c.   Difficult to place patient No        Consultants:  Cardiology   Procedures:  ECHO; EF 25 %  Antimicrobials:    Subjective: Had cath this am.  Denies dyspnea.  She has several questions about supply for CPAP/ CM consulted.   Objective: Vitals:   04/02/21 0017 04/02/21 0300 04/02/21 0710 04/02/21 0751  BP:  (!) 146/111 127/89   Pulse: [name])  101 (!) 113 (!) 106   Resp: (!) 21 12 (!) 21   Temp:  97.8 F (36.6 C) 97.9 F (36.6 C)   TempSrc:  Tympanic Oral   SpO2: 96% 99% 99% 100%  Weight:  89.6 kg    Height:        Intake/Output Summary (Last 24 hours) at 04/02/2021 0811 Last data filed at 04/02/2021 0550 Gross per 24 hour  Intake 981.75 ml  Output 2150 ml  Net -1168.25 ml    Filed Weights   03/31/21 0336 04/01/21 0608 04/02/21 0300  Weight: 92.9 kg 90.4 kg 89.6 kg    Examination:  General exam:  NAD Respiratory system: CTA Cardiovascular system: S 1, S 2 RRR Gastrointestinal system: BS present, soft, nt Central nervous system: Alert Extremities: No edema  Data Reviewed: I have personally reviewed following labs and imaging studies  CBC: Recent Labs  Lab 03/28/21 0529 04/01/21 0007 04/02/21 0025  WBC 7.8 10.4 8.8  NEUTROABS 5.3  --   --   HGB 12.2 12.5 12.5  HCT 40.5 39.6 41.0  MCV 84.2 81.8 82.2  PLT 213 248 443    Basic Metabolic Panel: Recent Labs  Lab 03/28/21 0916 03/29/21 0541 03/29/21 0902 03/30/21 0015 03/31/21 0018 04/01/21 0007 04/02/21 0025  NA  --  136 136 137 137 136 138  K  --  5.2* 3.9 3.8 3.8 3.7 3.4*  CL  --  107 106 104 106 103 103  CO2  --  18* 21* 23 24 24 28   GLUCOSE  --  116* 142* 121* 104* 151* 91  BUN  --  20 16 11 10 11 9   CREATININE  --  1.53* 1.21* 0.90 0.83 1.08* 0.94  CALCIUM  --  8.9 8.9 8.4* 8.4* 8.7* 9.0  MG 2.1 2.1  --   --   --   --   --   PHOS  --  4.6  --   --   --   --   --     GFR: Estimated Creatinine Clearance: 72.5 mL/min (by C-G formula based on SCr of 0.94 mg/dL). Liver Function Tests: No results for input(s): AST, ALT, ALKPHOS, BILITOT, PROT, ALBUMIN in the last 168 hours. No results for input(s): LIPASE, AMYLASE in the last 168 hours. No results for input(s): AMMONIA in the last 168 hours. Coagulation Profile: Recent Labs  Lab 04/02/21 0025  INR 1.5*   Cardiac Enzymes: No results for input(s): CKTOTAL, CKMB, CKMBINDEX, TROPONINI in the last 168 hours. BNP (last 3 results) No results for input(s): PROBNP in the last 8760 hours. HbA1C: No results for input(s): HGBA1C in the last 72 hours.  CBG: Recent Labs  Lab 03/31/21 1134  GLUCAP 106*    Lipid Profile: No results for input(s): CHOL, HDL, LDLCALC, TRIG, CHOLHDL, LDLDIRECT in the last 72 hours.  Thyroid Function Tests: No results for input(s): TSH, T4TOTAL, FREET4, T3FREE, THYROIDAB in the last 72 hours.  Anemia Panel: No results for  input(s): VITAMINB12, FOLATE, FERRITIN, TIBC, IRON, RETICCTPCT in the last 72 hours. Sepsis Labs: No results for input(s): PROCALCITON, LATICACIDVEN in the last 168 hours.  Recent Results (from the past 240 hour(s))  Resp Panel by RT-PCR (Flu A&B, Covid) Nasopharyngeal Swab     Status: None   Collection Time: 03/28/21  7:48 AM   Specimen: Nasopharyngeal Swab; Nasopharyngeal(NP) swabs in vial transport medium  Result Value Ref Range Status   SARS Coronavirus 2 by RT PCR NEGATIVE NEGATIVE Final  Comment: (NOTE) SARS-CoV-2 target nucleic acids are NOT DETECTED.  The SARS-CoV-2 RNA is generally detectable in upper respiratory specimens during the acute phase of infection. The lowest concentration of SARS-CoV-2 viral copies this assay can detect is 138 copies/mL. A negative result does not preclude SARS-Cov-2 infection and should not be used as the sole basis for treatment or other patient management decisions. A negative result may occur with  improper specimen collection/handling, submission of specimen other than nasopharyngeal swab, presence of viral mutation(s) within the areas targeted by this assay, and inadequate number of viral copies(<138 copies/mL). A negative result must be combined with clinical observations, patient history, and epidemiological information. The expected result is Negative.  Fact Sheet for Patients:  EntrepreneurPulse.com.au  Fact Sheet for Healthcare Providers:  IncredibleEmployment.be  This test is no t yet approved or cleared by the [place] FDA and  has been authorized for detection and/or diagnosis of SARS-CoV-2 by FDA under an Emergency Use Authorization (EUA). This EUA will remain  in effect (meaning this test can be used) for the duration of the COVID-19 declaration under Section 564(b)(1) of the Act, 21 U.S.C.section 360bbb-3(b)(1), unless the authorization is terminated  or revoked sooner.        Influenza A by PCR NEGATIVE NEGATIVE Final   Influenza B by PCR NEGATIVE NEGATIVE Final    Comment: (NOTE) The Xpert Xpress SARS-CoV-2/FLU/RSV plus assay is intended as an aid in the diagnosis of influenza from Nasopharyngeal swab specimens and should not be used as a sole basis for treatment. Nasal washings and aspirates are unacceptable for Xpert Xpress SARS-CoV-2/FLU/RSV testing.  Fact Sheet for Patients: EntrepreneurPulse.com.au  Fact Sheet for Healthcare Providers: IncredibleEmployment.be  This test is not yet approved or cleared by the [place] FDA and has been authorized for detection and/or diagnosis of SARS-CoV-2 by FDA under an Emergency Use Authorization (EUA). This EUA will remain in effect (meaning this test can be used) for the duration of the COVID-19 declaration under Section 564(b)(1) of the Act, 21 U.S.C. section 360bbb-3(b)(1), unless the authorization is terminated or revoked.  Performed at [facility], [place] [address], [place], [place] [address]   MRSA Next Gen by PCR, Nasal     Status: None   Collection Time: 03/28/21 11:05 AM   Specimen: Nasal Mucosa; Nasal Swab  Result Value Ref Range Status   MRSA by PCR Next Gen NOT DETECTED NOT DETECTED Final    Comment: (NOTE) The GeneXpert MRSA Assay (FDA approved for NASAL specimens only), is one component of a comprehensive MRSA colonization surveillance program. It is not intended to diagnose MRSA infection nor to guide or monitor treatment for MRSA infections. Test performance is not FDA approved in patients less than 56 years old. Performed at [facility], [place] [address] [place], [place] [address]           Radiology Studies: No results found.      Scheduled Meds:  [MAR Hold] atorvastatin  80 mg Oral q1800   [MAR Hold] dapagliflozin propanediol  10 mg Oral Daily   [MAR Hold] digoxin  0.125 mg Oral Daily   [MAR Hold] furosemide  40 mg Oral  Daily   [MAR Hold] gabapentin  300 mg Oral BID   [MAR Hold] metoprolol tartrate  100 mg Oral BID   potassium chloride  40 mEq Oral Once   [MAR Hold] sodium chloride flush  3 mL Intravenous Q12H   [MAR Hold] sodium chloride flush  3 mL Intravenous  Q12H   [MAR Hold] spironolactone  12.5 mg Oral Daily   Continuous Infusions:  [MAR Hold] sodium chloride     sodium chloride     sodium chloride 10 mL/hr at 04/02/21 0549   heparin 1,450 Units/hr (04/02/21 0550)     LOS: 5 days    Time spent: 35 minutes.     Elmarie Shiley, MD [facility]   If 7PM-7AM, please contact night-coverage [email or web address]  04/02/2021, 8:11 AM

## 2021-04-02 NOTE — Progress Notes (Signed)
Had 12 beats of v-tach, asymptomatic. Continue to monitor.

## 2021-04-02 NOTE — Progress Notes (Signed)
Heart Failure Patient Advocate Encounter   Received notification from [facility] Medicaid that prior authorization for [name] is required.   PA submitted on CoverMyMeds Key [id number] Status is approved through 04/02/22. Copay: $4 per month   Received notification from [facility] Medicaid that prior authorization for [name] is required.   PA submitted on CoverMyMeds Key [id number] Status is pending.    Kerby Nora, PharmD, BCPS Heart Failure Stewardship Pharmacist Phone [phone]  Please check [email or web address] for unit-specific pharmacist phone numbers

## 2021-04-02 NOTE — Progress Notes (Signed)
TR Band removed, applied 2x2 gauze and tegaderm dressing. No sign of bleeding noted. Continue to monitor.

## 2021-04-02 NOTE — Progress Notes (Signed)
+   Back from the cath lab by bed awake and alert. TR Band to right wrist and coban dressing to right brachial. No bleeding noted. Affected arm elevated with pillow and pulse ox to right thumb. Instructed not to move said arm.

## 2021-04-02 NOTE — Progress Notes (Signed)
Detached cardiac monitor when she goes to the bathroom. Instructed to call when getting out of bed.

## 2021-04-02 NOTE — Progress Notes (Addendum)
Progress Note  Patient Name: [name] Date of Encounter: 04/02/2021  Primary Cardiologist: Sanda Klein, MD   Subjective   Feels well overall after cath.  Discussed with patient, daughter Luetta Nutting and granddaughter in room. Excellent family support. Inpatient Medications    Scheduled Meds:  atorvastatin  80 mg Oral q1800   dapagliflozin propanediol  10 mg Oral Daily   digoxin  0.125 mg Oral Daily   furosemide  40 mg Oral Daily   gabapentin  300 mg Oral BID   metoprolol succinate  100 mg Oral BID   rivaroxaban  20 mg Oral Daily   [START ON 04/03/2021] sacubitril-valsartan  1 tablet Oral BID   sodium chloride flush  3 mL Intravenous Q12H   spironolactone  12.5 mg Oral Daily   Continuous Infusions:  sodium chloride     sodium chloride 10 mL/hr at 04/02/21 0549   sodium chloride     PRN Meds: sodium chloride, sodium chloride, acetaminophen, cyclobenzaprine, ondansetron (ZOFRAN) IV, oxyCODONE, sodium chloride flush   Vital Signs    Vitals:   04/02/21 1015 04/02/21 1100 04/02/21 1130 04/02/21 1200  BP: 138/83 (!) 125/92 (!) 144/93 108/80  Pulse: 97 84 95 86  Resp: 20 17 (!) 34 (!) 21  Temp:      TempSrc:      SpO2: 98% 94% 95% 97%  Weight:      Height:        Intake/Output Summary (Last 24 hours) at 04/02/2021 1535 Last data filed at 04/02/2021 1400 Gross per 24 hour  Intake 1045.75 ml  Output 2050 ml  Net -1004.25 ml   Filed Weights   03/31/21 0336 04/01/21 0608 04/02/21 0300  Weight: 92.9 kg 90.4 kg 89.6 kg    Telemetry    Afib rates 80s to 100s, PVCs - Personally Reviewed  ECG    7/14 - afib with normal rate 68, T wave inversions inferolateral, slightly more prominent than prior ECG on 06/15/20- Personally Reviewed  Physical Exam   GEN: No acute distress.   Neck: No JVD Cardiac: iRRR, no murmurs, rubs, or gallops.  Respiratory: Clear to auscultation bilaterally. GI: Soft, nontender, non-distended  MS: No edema; No deformity. Neuro:  Nonfocal   Psych: Normal affect    Labs    Chemistry Recent Labs  Lab 03/31/21 0018 04/01/21 0007 04/02/21 0025  NA 137 136 138  K 3.8 3.7 3.4*  CL 106 103 103  CO2 $Re'24 24 28  'LIt$ GLUCOSE 104* 151* 91  BUN $Re'10 11 9  'mrh$ CREATININE 0.83 1.08* 0.94  CALCIUM 8.4* 8.7* 9.0  GFRNONAA >60 >60 >60  ANIONGAP $RemoveB'7 9 7     'GTnihvwP$ Hematology Recent Labs  Lab 03/28/21 0529 04/01/21 0007 04/02/21 0025  WBC 7.8 10.4 8.8  RBC 4.81 4.84 4.99  HGB 12.2 12.5 12.5  HCT 40.5 39.6 41.0  MCV 84.2 81.8 82.2  MCH 25.4* 25.8* 25.1*  MCHC 30.1 31.6 30.5  RDW 15.6* 15.7* 15.5  PLT 213 248 295    Cardiac EnzymesNo results for input(s): TROPONINI in the last 168 hours. No results for input(s): TROPIPOC in the last 168 hours.   BNP Recent Labs  Lab 03/28/21 0529  BNP 325.5*     DDimer No results for input(s): DDIMER in the last 168 hours.   Radiology    CARDIAC CATHETERIZATION  Result Date: 04/02/2021 60% mid LAD after second diagonal. 75% distal circumflex before small third obtuse marginal. Right dominant anatomy Mild pulmonary hypertension with mean pressure  31 mmHg, mean capillary wedge pressure 16 mmHg, pulmonary vascular resistance 2.56 Wood units. Pulmonary artery O2 saturation 73%, cardiac output and index are 5.87 L/min and 3.01 L/min/m respectively. RECOMMENDATIONS: Guideline directed therapy for systolic dysfunction Rate control Further management per treating team.    Cardiac Studies   Echo: Personally reviewed - RV appears moderate-enlarged and mild-moderately reduced function.   1. Left ventricular ejection fraction, by estimation, is 25 to 30%. The  left ventricle has severely decreased function. The left ventricle  demonstrates global hypokinesis. There is mild left ventricular  hypertrophy. Left ventricular diastolic parameters   are indeterminate.   2. Right ventricular systolic function is mildly reduced. The right  ventricular size is moderately enlarged. There is normal pulmonary artery   systolic pressure. The estimated right ventricular systolic pressure is  92.4 mmHg.   3. Left atrial size was mildly dilated.   4. Right atrial size was moderately dilated.   5. The mitral valve is normal in structure. Moderate mitral valve  regurgitation. No evidence of mitral stenosis.   6. Tricuspid valve regurgitation is severe. Suspect functional from  annular dilatation.   7. The aortic valve is tricuspid. Aortic valve regurgitation is mild. No  aortic stenosis is present.   8. The inferior vena cava is dilated in size with >50% respiratory  variability, suggesting right atrial pressure of 8 mmHg.   9. The patient was in atrial fibrillation.  10. Significant change in LV and RV function compared to prior echo.   Patient Profile     55 y.o. female with longstanding persistent atrial fibrillation, like permanent, previously well rate controlled, now presenting with rapid ventricular response and acute systolic heart failure with marked reduction in LVEF, OSA noncompliant w CPAP, HTN, DM, HLP, with dense coronary artery calcifications on CTA Chest this admission.  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 No significant contribution of CAD, there is moderate mid and severe distal disease. - will rate control with metoprolol succinate 100 mg BID for benefit of heart failure and Afib. Will monitor on Toprol for 24-48 hr to ensure appropriate rate control.  Cr: 1.53 > 1.21 > 0.90 > 0.83> 1.08 > 0.94 UOP yesterday: 1.75 L Weight: Unreliable but 93.5 > 92.9 kg > 89.6 kg (98 kg at office visit 06/15/20) Net negative  for admission: -5.5 L Diuretic plan: furosemide 40 mg po daily (home dose is 20 mg prn) Heart Failure Therapy ACE-I/ARB/ARNI: add entresto tomorrow morning. BB: on lopressor 100 mg BID. Will consider transition to Toprol. MRA: spironolactone 12.5 daily.  SGLT2I: Farxiga 10 mg daily - review with social work for patient assistance as needed.  - resume xarelto tonight. Not on ASA. - atorva 80 mg daily - BB as above. -ARNI as above.  2. AFib:  - CHA2DS2-VASc 6 (history of stroke, female gender, DM, hypertension, CHF). Per Dr. Sallyanne Kuster primary cardiologist "It will be important to have some type of home ECG monitoring equipment to detect periods of RVR in the future.  She will be purchasing a Kardia device." - TSH is normal. - Uncontrolled OSA - will need to  be compliant for best results. - on two agents at home, BB and CCB. CCB diltiazem stopped due to low EF. Digoxin 0.125 mg daily started. On lopressor 100 mg Bid. - resume xarelto. -transition lopressor to toprol as above  3. OSA: Has not been compliant with treatment.  This may be contributing to be poorly controlled ventricular rates. Encouraged 100% compliance w CPAP. - c/o headache but notes tender temples as reason for not wearing mask. Will check esr/crp in AM to ensure no temporal arteritis playing a role in headaches or heart failure.  4. DM: recent hemoglobin A1c 6.7%.  Wilder Glade started.  5. HTN: Well-controlled.  As outpatient she was also receiving diltiazem and hydrochlorothiazide, now just on beta-blockers plus loop diuretics. Spironolactone has been added for HF therapy. BP hypotensive to normal this admission.   6. HLD: atorvastatin 80 mg.   7. AKI: Resolved.      For questions or updates, please contact [place] Please consult [email or web address] for contact info under      Signed, Elouise Munroe, MD  04/02/2021, 3:35 PM

## 2021-04-02 NOTE — Progress Notes (Signed)
PT Cancellation Note  Patient Details Name: [name] MRN: [id number] DOB: 1966/05/09   Cancelled Treatment:    Reason Eval/Treat Not Completed: Patient at procedure or test/unavailable (heart cath)   Sandy Salaam Shardae Kleinman 04/02/2021, 7:23 AM Bayard Males, PT Acute Rehabilitation Services Pager: [phone] Office: [phone]

## 2021-04-02 NOTE — Progress Notes (Signed)
[place] for heparin Indication: atrial fibrillation  Allergies  Allergen Reactions   Penicillins Hives and Other (See Comments)    Unknown  Has patient had a PCN reaction causing immediate rash, facial/tongue/throat swelling, SOB or lightheadedness with hypotension: No Has patient had a PCN reaction causing severe rash involving mucus membranes or skin necrosis: No Has patient had a PCN reaction that required hospitalization No Has patient had a PCN reaction occurring within the last 10 years: No If all of the above answers are "NO", then may proceed with Cephalosporin use.    Patient Measurements: Height: 5\' 4"  (162.6 cm) Weight: 90.4 kg (199 lb 3.2 oz) IBW/kg (Calculated) : 54.7 Heparin Dosing Weight: 76.73 kg   Vital Signs: Temp: 97.7 F (36.5 C) (07/18 2300) Temp Source: Oral (07/18 2300) BP: 130/89 (07/18 2300) Pulse Rate: 101 (07/19 0017)  Labs: Recent Labs    03/31/21 0018 04/01/21 0007 04/01/21 1801 04/02/21 0025  HGB  --  12.5  --  12.5  HCT  --  39.6  --  41.0  PLT  --  248  --  295  APTT  --   --  36 66*  LABPROT  --   --   --  18.5*  INR  --   --   --  1.5*  HEPARINUNFRC  --   --  >1.10* >1.10*  CREATININE 0.83 1.08*  --  0.94     Estimated Creatinine Clearance: 72.8 mL/min (by C-G formula based on SCr of 0.94 mg/dL).   Medical History: Past Medical History:  Diagnosis Date   Clotting disorder ([place])    on xarelto   Essential hypertension    Heart murmur    a. 10/2015 Echo: EF 60-65%, no rwma, mild AI/MR, sev dil LA/RA, PASP 53mmHg.   Noncompliance    NSVT (nonsustained ventricular tachycardia) ([place])    a. 10/2015 during admission for CVA/AF.   Persistent atrial fibrillation ([place])    a. CHA2DS2VASC = 4-->Xarelto;  b. 02/2016 Successful DCCV.  c. 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   Transient ischemic attack (TIA) 01/2013    Assessment: 55 yo F with h/x of CVA on Xarelto PTA for AF (20 mg). No bolus d/t Xarelto dose 7/17 pm.   Heparin level >1.1 (due to Xarelto), aPTT 66 sec (low end of therapeutic) on gtt at 1400 units/hr. No bleeding noted.  Goal of Therapy:  Heparin level 0.3-0.7 units/ml aPTT 66-102 seconds Monitor platelets by anticoagulation protocol: Yes   Plan:  Increase heparin infusion slightly to 1450 units/hr  F/u 6hr aPTT to confirm therapeutic  Sherlon Handing, PharmD, BCPS Please see amion for complete clinical pharmacist phone list 04/02/2021, 3:07 AM

## 2021-04-03 ENCOUNTER — Other Ambulatory Visit (HOSPITAL_COMMUNITY): Payer: Self-pay

## 2021-04-03 DIAGNOSIS — E785 Hyperlipidemia, unspecified: Secondary | ICD-10-CM | POA: Diagnosis not present

## 2021-04-03 DIAGNOSIS — R778 Other specified abnormalities of plasma proteins: Secondary | ICD-10-CM | POA: Diagnosis not present

## 2021-04-03 DIAGNOSIS — I5041 Acute combined systolic (congestive) and diastolic (congestive) heart failure: Secondary | ICD-10-CM | POA: Diagnosis not present

## 2021-04-03 DIAGNOSIS — I4891 Unspecified atrial fibrillation: Secondary | ICD-10-CM | POA: Diagnosis not present

## 2021-04-03 DIAGNOSIS — Z8673 Personal history of transient ischemic attack (TIA), and cerebral infarction without residual deficits: Secondary | ICD-10-CM | POA: Diagnosis not present

## 2021-04-03 LAB — CBC
HCT: 42.5 % (ref 36.0–46.0)
Hemoglobin: 13.2 g/dL (ref 12.0–15.0)
MCH: 25.2 pg — ABNORMAL LOW (ref 26.0–34.0)
MCHC: 31.1 g/dL (ref 30.0–36.0)
MCV: 81.3 fL (ref 80.0–100.0)
Platelets: 279 10*3/uL (ref 150–400)
RBC: 5.23 MIL/uL — ABNORMAL HIGH (ref 3.87–5.11)
RDW: 15.4 % (ref 11.5–15.5)
WBC: 8.3 10*3/uL (ref 4.0–10.5)
nRBC: 0 % (ref 0.0–0.2)

## 2021-04-03 LAB — BASIC METABOLIC PANEL
Anion gap: 7 (ref 5–15)
BUN: 8 mg/dL (ref 6–20)
CO2: 25 mmol/L (ref 22–32)
Calcium: 9 mg/dL (ref 8.9–10.3)
Chloride: 106 mmol/L (ref 98–111)
Creatinine, Ser: 0.89 mg/dL (ref 0.44–1.00)
GFR, Estimated: >60 mL/min (ref >60)
Glucose, Bld: 100 mg/dL — ABNORMAL HIGH (ref 70–99)
Potassium: 4.1 mmol/L (ref 3.5–5.1)
Sodium: 138 mmol/L (ref 135–145)

## 2021-04-03 LAB — SEDIMENTATION RATE: Sed Rate: 28 mm/hr — ABNORMAL HIGH (ref 0–22)

## 2021-04-03 LAB — C-REACTIVE PROTEIN: CRP: 1.6 mg/dL — ABNORMAL HIGH (ref <1.0)

## 2021-04-03 NOTE — Progress Notes (Signed)
Heart Failure Patient Advocate Encounter   Received notification from [facility] Medicaid that prior authorization for Wilder Glade is required.   PA submitted on CoverMyMeds Key [id number] Status is approved through 04/02/22. Copay: $4 per month   Received notification from [facility] Medicaid that prior authorization for Delene Loll is required.   PA submitted on CoverMyMeds Key [id number] Status is approved through 04/02/22. Copay: $4 per month   Kerby Nora, PharmD, BCPS Heart Failure Stewardship Pharmacist Phone [phone]  Please check [email or web address] for unit-specific pharmacist phone numbers

## 2021-04-03 NOTE — Plan of Care (Signed)

## 2021-04-03 NOTE — Progress Notes (Signed)
Heart Failure Stewardship Pharmacist Progress Note   PCP: [name], MD PCP-Cardiologist: Sanda Klein, MD    HPI:  55 yo F with PMH of afib, CHF, CVA, OSA, HTN, and T2DM. She presented to the ED on 03/28/21 with shortness of breath, orthopnea, and LE edema. She was not taking her PRN furosemide PTA due to not knowing when to appropriately take it. CXR was done and showed cardiomegaly and baseline pulmonary vascular congestion without overt edema. An ECHO was done on 7/14 and LVEF is now reduced to 25-30% (down from 60-65% in 04/2019) with mild LVH and mildly reduced RV systolic function. Also found to be in afib RVR. R/LHC done on 7/19 and found to have mild pulmonary hypertension, 60% stenosis in LAD and 70% stenosis in LCx.   Current HF Medications: Furosemide 40 mg daily Metoprolol XL 100 mg BID Entresto 24/26 mg BID Spironolactone 12.5 mg daily Farxiga 10 mg daily Digoxin 0.125 mg daily  Prior to admission HF Medications: Furosemide 20 mg daily PRN Metoprolol tartrate 25 mg BID  Pertinent Lab Values: Serum creatinine 0.89, BUN 8, Potassium 4.1, Sodium 138, BNP 325.5, Digoxin level due around 7/21-7/22  Vital Signs: Weight: 189 lbs (admission weight: 206 lbs)  Blood pressure: 100/70s Heart rate: 90-100s   Medication Assistance / Insurance Benefits Check: Does the patient have prescription insurance? Yes Type of insurance plan: [place] Medicaid  Outpatient Pharmacy:  Prior to admission outpatient pharmacy: [facility] Is the patient willing to use [facility] at discharge? Yes    Assessment: 1. Acute on chronic systolic CHF (EF 00-92%), pending ischemic evaluation. NYHA class III symptoms. - Continue furosemide 40 mg daily - Continue metoprolol XL - can consolidate to 200 mg once daily - Continue Entresto 24/26 mg BID  - Continue spironolactone 12.5 mg daily - Continue Farxiga 10 mg daily - Continue digoxin 0.125 mg daily. Will check level  tomorrow AM. - Agree with discontinuing diltiazem with new reduced EF   Plan: 1) Medication changes recommended at this time: - Consider changing metoprolol XL to 200 mg daily instead of 100 mg BID - Check digoxin level in AM  2) Patient assistance: - Prior authorizations approved for Farxiga and Entresto - Farxiga copay $4 per month; Entresto copay $4 per month - HF TOC appt made for 7/26  3)  Education  - To be completed prior to discharge  Kerby Nora, PharmD, BCPS Heart Failure Stewardship Pharmacist Phone [phone]

## 2021-04-03 NOTE — Progress Notes (Signed)
Progress Note  Patient Name: [name] Date of Encounter: 04/03/2021  Primary Cardiologist: Sanda Klein, MD   Subjective   Feels well.   Inpatient Medications    Scheduled Meds:  atorvastatin  80 mg Oral q1800   dapagliflozin propanediol  10 mg Oral Daily   digoxin  0.125 mg Oral Daily   furosemide  40 mg Oral Daily   gabapentin  300 mg Oral BID   metoprolol succinate  100 mg Oral BID   rivaroxaban  20 mg Oral Daily   sacubitril-valsartan  1 tablet Oral BID   sodium chloride flush  3 mL Intravenous Q12H   spironolactone  12.5 mg Oral Daily   Continuous Infusions:  sodium chloride     sodium chloride 10 mL/hr at 04/02/21 0549   sodium chloride     PRN Meds: sodium chloride, sodium chloride, acetaminophen, cyclobenzaprine, ondansetron (ZOFRAN) IV, oxyCODONE, sodium chloride flush   Vital Signs    Vitals:   04/03/21 0335 04/03/21 0400 04/03/21 0713 04/03/21 0743  BP: 103/75 109/83  94/74  Pulse: (!) 103 92  92  Resp: _0 Temp: 97.8 F (36.6 C)   97.7 F (36.5 C)  TempSrc: Axillary   Oral  SpO2: 99%   93%  Weight:   86 kg   Height:        Intake/Output Summary (Last 24 hours) at 04/03/2021 0749 Last data filed at 04/03/2021 0700 Gross per 24 hour  Intake 550 ml  Output 1425 ml  Net -875 ml   Filed Weights   04/01/21 0608 04/02/21 0300 04/03/21 0713  Weight: 90.4 kg 89.6 kg 86 kg    Telemetry    Afib rates 80s to 100s, PVCs - Personally Reviewed  ECG    7/14 - afib with normal rate 68, T wave inversions inferolateral, slightly more prominent than prior ECG on 06/15/20- Personally Reviewed  Physical Exam   GEN: No acute distress.   Neck: No JVD Cardiac: iRRR, no murmurs, rubs, or gallops.  Respiratory: Clear to auscultation bilaterally. GI: Soft, nontender, non-distended  MS: No edema; No deformity. Neuro:  Nonfocal  Psych: Normal affect    Labs    Chemistry Recent Labs  Lab 04/01/21 0007 04/02/21 0025 04/02/21 0819  04/02/21 0824 04/02/21 0826 04/03/21 0043  NA 136 138   < > 139 143 138  K 3.7 3.4*   < > 3.7 3.8 4.1  CL 103 103  --   --   --  106  CO2 24 28  --   --   --  25  GLUCOSE 151* 91  --   --   --  100*  BUN 11 9  --   --   --  8  CREATININE 1.08* 0.94  --   --   --  0.89  CALCIUM 8.7* 9.0  --   --   --  9.0  GFRNONAA >60 >60  --   --   --  >60  ANIONGAP 9 7  --   --   --  7   < > = values in this interval not displayed.     Hematology Recent Labs  Lab 04/01/21 0007 04/02/21 0025 04/02/21 0819 04/02/21 0824 04/02/21 0826 04/03/21 0043  WBC 10.4 8.8  --   --   --  8.3  RBC 4.84 4.99  --   --   --  5.23*  HGB 12.5 12.5   < > 13.3  13.6 13.2  HCT 39.6 41.0   < > 39.0 40.0 42.5  MCV 81.8 82.2  --   --   --  81.3  MCH 25.8* 25.1*  --   --   --  25.2*  MCHC 31.6 30.5  --   --   --  31.1  RDW 15.7* 15.5  --   --   --  15.4  PLT 248 295  --   --   --  279   < > = values in this interval not displayed.    Cardiac EnzymesNo results for input(s): TROPONINI in the last 168 hours. No results for input(s): TROPIPOC in the last 168 hours.   BNP Recent Labs  Lab 03/28/21 0529  BNP 325.5*     DDimer No results for input(s): DDIMER in the last 168 hours.   Radiology    CARDIAC CATHETERIZATION  Result Date: 04/02/2021 60% mid LAD after second diagonal. 75% distal circumflex before small third obtuse marginal. Right dominant anatomy Mild pulmonary hypertension with mean pressure 31 mmHg, mean capillary wedge pressure 16 mmHg, pulmonary vascular resistance 2.56 Wood units. Pulmonary artery O2 saturation 73%, cardiac output and index are 5.87 L/min and 3.01 L/min/m respectively. RECOMMENDATIONS: Guideline directed therapy for systolic dysfunction Rate control Further management per treating team.    Cardiac Studies   Echo: Personally reviewed - RV appears moderate-enlarged and mild-moderately reduced function.   1. Left ventricular ejection fraction, by estimation, is 25 to 30%. The   left ventricle has severely decreased function. The left ventricle  demonstrates global hypokinesis. There is mild left ventricular  hypertrophy. Left ventricular diastolic parameters   are indeterminate.   2. Right ventricular systolic function is mildly reduced. The right  ventricular size is moderately enlarged. There is normal pulmonary artery  systolic pressure. The estimated right ventricular systolic pressure is  88.8 mmHg.   3. Left atrial size was mildly dilated.   4. Right atrial size was moderately dilated.   5. The mitral valve is normal in structure. Moderate mitral valve  regurgitation. No evidence of mitral stenosis.   6. Tricuspid valve regurgitation is severe. Suspect functional from  annular dilatation.   7. The aortic valve is tricuspid. Aortic valve regurgitation is mild. No  aortic stenosis is present.   8. The inferior vena cava is dilated in size with >50% respiratory  variability, suggesting right atrial pressure of 8 mmHg.   9. The patient was in atrial fibrillation.  10. Significant change in LV and RV function compared to prior echo.   Patient Profile     55 y.o. female with longstanding persistent atrial fibrillation, likely permanent, previously well rate controlled, now presenting with rapid ventricular response and acute systolic heart failure with marked reduction in LVEF, OSA noncompliant w CPAP, HTN, DM, HLP, with dense coronary artery calcifications on CTA Chest this admission.  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 No significant contribution of CAD, there is moderate mid  and severe distal disease. - will rate control with metoprolol succinate 100 mg BID for benefit of heart failure and Afib. Will monitor  on Toprol for 24-48 hr to ensure appropriate rate control.  Cr: 0.89 UOP yesterday: 1.625 L Weight: Unreliable but 93.5 > 92.9 kg > 89.6 kg > 86 kg (98 kg at office visit 06/15/20) Net negative for admission: -4.7 L Diuretic plan: furosemide 40 mg po daily (home dose is 20 mg prn) Heart Failure Therapy ACE-I/ARB/ARNI: Entresto 24-26 mg BID BB: Toprol 100 mg BID - dosing BID to avoid hypotension and encourage compliance. MRA: spironolactone 12.5 daily.  SGLT2I: Farxiga 10 mg daily   CAD: - Xarelto, not on ASA. - atorva 80 mg daily - BB as above. -ARNI as above.  2. AFib:  - CHA2DS2-VASc 6 (history of stroke, female gender, DM, hypertension, CHF). - Uncontrolled OSA - will need to be compliant for best results. - on two agents at home, BB and CCB. CCB diltiazem stopped due to low EF. Digoxin 0.125 mg daily started. - xarelto. - Toprol as above.  3. OSA: Has not been compliant with treatment.  This may be contributing to be poorly controlled ventricular rates. Encouraged 100% compliance w CPAP. - c/o headache but notes tender temples as reason for not wearing mask. Will check esr/crp in AM to ensure no temporal arteritis playing a role in headaches or heart failure. Mildly elevated ESR/CRP, not compelling for giant cell arteritis but cannot fully exclude. Mildly elevated troponin on presentation also less suggestive of myocarditis associated with vasculitis. No visual symptoms and longstanding headache - could pursue outpatient neurology/rheumatology evaluation, or further workup per IM primary service if indicated.  4. DM: recent hemoglobin A1c 6.7%.  Wilder Glade started.  5. HTN: Well-controlled.  As outpatient she was also receiving diltiazem and hydrochlorothiazide, now just on beta-blockers plus loop diuretics. Spironolactone has been added for HF  therapy. BP hypotensive to normal this admission.   6. HLD: atorvastatin 80 mg.   7. AKI: Resolved.      For questions or updates, please contact [place] Please consult [email or web address] for contact info under      Signed, Elouise Munroe, MD  04/03/2021, 7:49 AM

## 2021-04-03 NOTE — Progress Notes (Signed)
PROGRESS NOTE  [name] [id number] DOB: [birth year] DOA: 03/28/2021 PCP: [name], MD   LOS: 6 days   Brief Narrative / Interim history: 55 year old female with history of PAF on anticoagulation, prior CVA/TIA, HTN, comes into the hospital with 2 days worsening shortness of breath.  She reported chronic palpitations from her A. fib, but also 10 pound weight gain in increased lower extremity edema.  She was found to be in A. fib with RVR with rates into the 130s, chest x-ray showed pulmonary vascular congestion.  Cardiology was consulted due to acute on chronic CHF and A. fib with RVR.  Subjective / 24h Interval events: She is doing well this morning, asking whether she can go home.  She denies any chest pain, denies any palpitations.  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 fib with RVR.  She underwent a 2D echo which showed EF 25-30%, TV regurgitation and moderate MV regurgitation.  Cardiology consulted and followed patient while hospitalized.  She initially was diuresed with IV Lasix with improvement in her condition.  Underwent a cardiac cath which showed 50-60% mid LAD, 70-80% distal circumflex, with plans for medical management.  She is currently on atorvastatin 80 mg, digoxin 0.125, furosemide 40 mg daily, metoprolol 100 mg twice daily and was also added on Farxiga as well as Entresto.  If she tolerates this well without significant hypotension could potentially be discharged home tomorrow  Active Problems Paroxysmal A fib -Cardizem discontinued due to new lower ejection fraction and she was placed on metoprolol.  She is anticoagulated with Xarelto.  She is also on digoxin. Essential HTN -Continue home regimen as below.  Her HCTZ has been replaced with Lasix DM type 2 -well  controlled, A1c 6.7.  Continue Farxiga OSA-Continue with CPAP. HLD-Continue with Lipitor. Hx CVA, TIA- Continue anticoagulation, statin Elevation troponin -due to demand in the setting of A fib with RVR  Scheduled Meds:  atorvastatin  80 mg Oral q1800   dapagliflozin propanediol  10 mg Oral Daily   digoxin  0.125 mg Oral Daily   furosemide  40 mg Oral Daily   gabapentin  300 mg Oral BID   metoprolol succinate  100 mg Oral BID   rivaroxaban  20 mg Oral Daily   sacubitril-valsartan  1 tablet Oral BID   sodium chloride flush  3 mL Intravenous Q12H   spironolactone  12.5 mg Oral Daily   Continuous Infusions:  sodium chloride     sodium chloride 10 mL/hr at 04/02/21 0549   sodium chloride     PRN Meds:.sodium chloride, sodium chloride, acetaminophen, cyclobenzaprine, ondansetron (ZOFRAN) IV, oxyCODONE, sodium chloride flush  Diet Orders (From admission, onward)     Start     Ordered   04/02/21 0911  Diet Heart Room service appropriate? Yes; Fluid consistency: Thin  Diet effective now       Question Answer Comment  Room service appropriate? Yes   Fluid consistency: Thin      04/02/21 0911            DVT prophylaxis:  rivaroxaban (XARELTO) tablet 20 mg     Code Status: Full Code  Family Communication: No family present at bedside  Status is: Inpatient  Remains inpatient appropriate because:Inpatient level of care appropriate due to severity of illness  Dispo: The patient is from: Home  Anticipated d/c is to: Home              Patient currently is not medically stable to d/c.   Difficult to place patient No  Level of care: Progressive  Consultants:  Cardiology   Procedures:  2D echo Cardiac cath  Microbiology  None   Antimicrobials: None     Objective: Vitals:   04/03/21 0335 04/03/21 0400 04/03/21 0713 04/03/21 0743  BP: 103/75 109/83  94/74  Pulse: (!) 103 92  92  Resp: 20 15  17   Temp: 97.8 F (36.6 C)   97.7 F (36.5 C)   TempSrc: Axillary   Oral  SpO2: 99%   93%  Weight:   86 kg   Height:        Intake/Output Summary (Last 24 hours) at 04/03/2021 1113 Last data filed at 04/03/2021 0800 Gross per 24 hour  Intake 740 ml  Output 1425 ml  Net -685 ml   Filed Weights   04/01/21 0608 04/02/21 0300 04/03/21 0713  Weight: 90.4 kg 89.6 kg 86 kg    Examination:  Constitutional: NAD Eyes: no scleral icterus ENMT: Mucous membranes are moist.  Neck: normal, supple Respiratory: clear to auscultation bilaterally, no wheezing, no crackles. Normal respiratory effort.  Cardiovascular: Regular rate and rhythm, no murmurs / rubs / gallops. No LE edema.  Abdomen: non distended, no tenderness. Bowel sounds positive.  Musculoskeletal: no clubbing / cyanosis.  Skin: no rashes Neurologic: non focal    Data Reviewed: I have independently reviewed following labs and imaging studies   CBC: Recent Labs  Lab 03/28/21 0529 04/01/21 0007 04/02/21 0025 04/02/21 0819 04/02/21 0824 04/02/21 0826 04/03/21 0043  WBC 7.8 10.4 8.8  --   --   --  8.3  NEUTROABS 5.3  --   --   --   --   --   --   HGB 12.2 12.5 12.5 13.6 13.3 13.6 13.2  HCT 40.5 39.6 41.0 40.0 39.0 40.0 42.5  MCV 84.2 81.8 82.2  --   --   --  81.3  PLT 213 248 295  --   --   --  163   Basic Metabolic Panel: Recent Labs  Lab 03/28/21 0916 03/29/21 0541 03/29/21 0902 03/30/21 0015 03/31/21 0018 04/01/21 0007 04/02/21 0025 04/02/21 0819 04/02/21 0824 04/02/21 0826 04/03/21 0043  NA  --  136   < > 137 137 136 138 143 139 143 138  K  --  5.2*   < > 3.8 3.8 3.7 3.4* 3.8 3.7 3.8 4.1  CL  --  107   < > 104 106 103 103  --   --   --  106  CO2  --  18*   < > 23 24 24 28   --   --   --  25  GLUCOSE  --  116*   < > 121* 104* 151* 91  --   --   --  100*  BUN  --  20   < > 11 10 11 9   --   --   --  8  CREATININE  --  1.53*   < > 0.90 0.83 1.08* 0.94  --   --   --  0.89  CALCIUM  --  8.9   < > 8.4* 8.4* 8.7* 9.0  --   --   --  9.0  MG 2.1 2.1  --   --    --   --   --   --   --   --   --  PHOS  --  4.6  --   --   --   --   --   --   --   --   --    < > = values in this interval not displayed.   Liver Function Tests: No results for input(s): AST, ALT, ALKPHOS, BILITOT, PROT, ALBUMIN in the last 168 hours. Coagulation Profile: Recent Labs  Lab 04/02/21 0025  INR 1.5*   HbA1C: No results for input(s): HGBA1C in the last 72 hours. CBG: Recent Labs  Lab 03/31/21 1134  GLUCAP 106*    Recent Results (from the past 240 hour(s))  Resp Panel by RT-PCR (Flu A&B, Covid) Nasopharyngeal Swab     Status: None   Collection Time: 03/28/21  7:48 AM   Specimen: Nasopharyngeal Swab; Nasopharyngeal(NP) swabs in vial transport medium  Result Value Ref Range Status   SARS Coronavirus 2 by RT PCR NEGATIVE NEGATIVE Final    Comment: (NOTE) SARS-CoV-2 target nucleic acids are NOT DETECTED.  The SARS-CoV-2 RNA is generally detectable in upper respiratory specimens during the acute phase of infection. The lowest concentration of SARS-CoV-2 viral copies this assay can detect is 138 copies/mL. A negative result does not preclude SARS-Cov-2 infection and should not be used as the sole basis for treatment or other patient management decisions. A negative result may occur with  improper specimen collection/handling, submission of specimen other than nasopharyngeal swab, presence of viral mutation(s) within the areas targeted by this assay, and inadequate number of viral copies(<138 copies/mL). A negative result must be combined with clinical observations, patient history, and epidemiological information. The expected result is Negative.  Fact Sheet for Patients:  EntrepreneurPulse.com.au  Fact Sheet for Healthcare Providers:  IncredibleEmployment.be  This test is no t yet approved or cleared by the [place] FDA and  has been authorized for detection and/or diagnosis of SARS-CoV-2 by FDA under an Emergency  Use Authorization (EUA). This EUA will remain  in effect (meaning this test can be used) for the duration of the COVID-19 declaration under Section 564(b)(1) of the Act, 21 U.S.C.section 360bbb-3(b)(1), unless the authorization is terminated  or revoked sooner.       Influenza A by PCR NEGATIVE NEGATIVE Final   Influenza B by PCR NEGATIVE NEGATIVE Final    Comment: (NOTE) The Xpert Xpress SARS-CoV-2/FLU/RSV plus assay is intended as an aid in the diagnosis of influenza from Nasopharyngeal swab specimens and should not be used as a sole basis for treatment. Nasal washings and aspirates are unacceptable for Xpert Xpress SARS-CoV-2/FLU/RSV testing.  Fact Sheet for Patients: EntrepreneurPulse.com.au  Fact Sheet for Healthcare Providers: IncredibleEmployment.be  This test is not yet approved or cleared by the [place] FDA and has been authorized for detection and/or diagnosis of SARS-CoV-2 by FDA under an Emergency Use Authorization (EUA). This EUA will remain in effect (meaning this test can be used) for the duration of the COVID-19 declaration under Section 564(b)(1) of the Act, 21 U.S.C. section 360bbb-3(b)(1), unless the authorization is terminated or revoked.  Performed at [facility], [place] [address], [place], [place] [address]   MRSA Next Gen by PCR, Nasal     Status: None   Collection Time: 03/28/21 11:05 AM   Specimen: Nasal Mucosa; Nasal Swab  Result Value Ref Range Status   MRSA by PCR Next Gen NOT DETECTED NOT DETECTED Final    Comment: (NOTE) The GeneXpert MRSA Assay (FDA approved for NASAL specimens only), is one component of a comprehensive MRSA  colonization surveillance program. It is not intended to diagnose MRSA infection nor to guide or monitor treatment for MRSA infections. Test performance is not FDA approved in patients less than 30 years old. Performed at [facility], [place] [address], [place],  [place] [address]      Radiology Studies: No results found.   Marzetta Board, MD, PhD [facility]  Between 7 am - 7 pm I am available, please contact me via Amion (for emergencies) or Securechat (non urgent messages)  Between 7 pm - 7 am I am not available, please contact night coverage MD/APP via Amion

## 2021-04-04 ENCOUNTER — Other Ambulatory Visit (HOSPITAL_COMMUNITY): Payer: Self-pay

## 2021-04-04 DIAGNOSIS — Z8673 Personal history of transient ischemic attack (TIA), and cerebral infarction without residual deficits: Secondary | ICD-10-CM | POA: Diagnosis not present

## 2021-04-04 DIAGNOSIS — I5041 Acute combined systolic (congestive) and diastolic (congestive) heart failure: Secondary | ICD-10-CM | POA: Diagnosis not present

## 2021-04-04 DIAGNOSIS — R778 Other specified abnormalities of plasma proteins: Secondary | ICD-10-CM | POA: Diagnosis not present

## 2021-04-04 DIAGNOSIS — I4891 Unspecified atrial fibrillation: Secondary | ICD-10-CM | POA: Diagnosis not present

## 2021-04-04 DIAGNOSIS — E785 Hyperlipidemia, unspecified: Secondary | ICD-10-CM | POA: Diagnosis not present

## 2021-04-04 LAB — BASIC METABOLIC PANEL
Anion gap: 10 (ref 5–15)
BUN: 10 mg/dL (ref 6–20)
CO2: 21 mmol/L — ABNORMAL LOW (ref 22–32)
Calcium: 9.1 mg/dL (ref 8.9–10.3)
Chloride: 104 mmol/L (ref 98–111)
Creatinine, Ser: 0.89 mg/dL (ref 0.44–1.00)
GFR, Estimated: >60 mL/min (ref >60)
Glucose, Bld: 110 mg/dL — ABNORMAL HIGH (ref 70–99)
Potassium: 3.9 mmol/L (ref 3.5–5.1)
Sodium: 135 mmol/L (ref 135–145)

## 2021-04-04 LAB — DIGOXIN LEVEL: Digoxin Level: <0.2 ng/mL — ABNORMAL LOW (ref 0.8–2.0)

## 2021-04-04 LAB — MAGNESIUM: Magnesium: 2 mg/dL (ref 1.7–2.4)

## 2021-04-04 MED ORDER — DAPAGLIFLOZIN PROPANEDIOL 10 MG PO TABS
10.0000 mg | ORAL_TABLET | Freq: Every day | ORAL | 1 refills | Status: DC
  Filled 2021-04-04: qty 30, 30d supply, fill #0
  Filled 2021-05-22: qty 30, 30d supply, fill #1

## 2021-04-04 MED ORDER — METOPROLOL SUCCINATE ER 50 MG PO TB24
150.0000 mg | ORAL_TABLET | Freq: Every day | ORAL | 1 refills | Status: DC
  Filled 2021-04-04: qty 90, 30d supply, fill #0
  Filled 2021-05-22: qty 90, 30d supply, fill #1

## 2021-04-04 MED ORDER — METOPROLOL SUCCINATE ER 50 MG PO TB24
150.0000 mg | ORAL_TABLET | Freq: Every day | ORAL | 1 refills | Status: DC
  Filled 2021-04-04: qty 30, 10d supply, fill #0

## 2021-04-04 MED ORDER — FUROSEMIDE 40 MG PO TABS
40.0000 mg | ORAL_TABLET | Freq: Every day | ORAL | 1 refills | Status: DC
  Filled 2021-04-04: qty 30, 30d supply, fill #0
  Filled 2021-05-22: qty 30, 30d supply, fill #1

## 2021-04-04 MED ORDER — DIGOXIN 250 MCG PO TABS
0.2500 mg | ORAL_TABLET | Freq: Every day | ORAL | 1 refills | Status: DC
  Filled 2021-04-04: qty 30, 30d supply, fill #0
  Filled 2021-05-16 – 2021-05-22 (×2): qty 30, 30d supply, fill #1

## 2021-04-04 MED ORDER — METOPROLOL SUCCINATE ER 50 MG PO TB24
150.0000 mg | ORAL_TABLET | Freq: Every day | ORAL | Status: DC
  Administered 2021-04-04: 150 mg via ORAL
  Filled 2021-04-04: qty 1

## 2021-04-04 MED ORDER — SPIRONOLACTONE 25 MG PO TABS
12.5000 mg | ORAL_TABLET | Freq: Every day | ORAL | 1 refills | Status: DC
  Filled 2021-04-04: qty 15, 30d supply, fill #0
  Filled 2021-05-22: qty 15, 30d supply, fill #1

## 2021-04-04 MED ORDER — DIGOXIN 125 MCG PO TABS
0.2500 mg | ORAL_TABLET | Freq: Every day | ORAL | Status: DC
  Administered 2021-04-04: 0.25 mg via ORAL
  Filled 2021-04-04: qty 2

## 2021-04-04 NOTE — TOC Transition Note (Signed)
Transition of Care [facility]) - CM/SW Discharge Note   Patient Details  Name: [name] MRN: [id number] Date of Birth: 1965/09/29  [facility] CM/SW Contact:  Zenon Mayo, RN Phone Number: 04/04/2021, 12:52 PM   Clinical Narrative:    Patient is for dc today, she has transportation at dc.  She has a scale at home and a bp  cuff. She has no issues with taking her medications or purchasing them.  She states she tries to eat a low sodium diet but could do better.  She was using the cpap machine here at the hospital , she states she needs another mask, she will take the mask she was using here at the hospital home with her.  She has no other needs.    Final next level of care: Home/Self Care Barriers to Discharge: No Barriers Identified   Patient Goals and CMS Choice Patient states their goals for this hospitalization and ongoing recovery are:: return home   Choice offered to / list presented to : NA  Discharge Placement                       Discharge Plan and Services                  DME Agency: NA                  Social Determinants of Health (SDOH) Interventions     Readmission Risk Interventions No flowsheet data found.

## 2021-04-04 NOTE — Progress Notes (Signed)
Heart Failure Stewardship Pharmacist Progress Note   PCP: [name], MD PCP-Cardiologist: Sanda Klein, MD    HPI:  55 yo F with PMH of afib, CHF, CVA, OSA, HTN, and T2DM. She presented to the ED on 03/28/21 with shortness of breath, orthopnea, and LE edema. She was not taking her PRN furosemide PTA due to not knowing when to appropriately take it. CXR was done and showed cardiomegaly and baseline pulmonary vascular congestion without overt edema. An ECHO was done on 7/14 and LVEF is now reduced to 25-30% (down from 60-65% in 04/2019) with mild LVH and mildly reduced RV systolic function. Also found to be in afib RVR. R/LHC done on 7/19 and found to have mild pulmonary hypertension, 60% stenosis in LAD and 70% stenosis in LCx.   Discharge HF Medications: Furosemide 40 mg daily Metoprolol XL 150 mg daily Spironolactone 12.5 mg daily Farxiga 10 mg daily Digoxin 0.25 mg daily  Prior to admission HF Medications: Furosemide 20 mg daily PRN Metoprolol tartrate 25 mg BID  Pertinent Lab Values: Serum creatinine 0.89, BUN 10, Potassium 3.9, Sodium 135, BNP 325.5, Magnesium 2.0, Digoxin level <0.2 on 7/21  Vital Signs: Weight: 189 lbs (admission weight: 206 lbs)  Blood pressure: 90/70s Heart rate: 90-100s   Medication Assistance / Insurance Benefits Check: Does the patient have prescription insurance? Yes Type of insurance plan: [place] Medicaid  Outpatient Pharmacy:  Prior to admission outpatient pharmacy: [facility] Is the patient willing to use [facility] at discharge? Yes    Assessment: 1) Acute on chronic systolic CHF (EF 123XX123), pending ischemic evaluation. NYHA class III symptoms. - Continue furosemide 40 mg daily - Continue metoprolol XL 150 mg daily - Off Entresto with hypotension - may be able to restart at [facility] follow up - Continue spironolactone 12.5 mg daily - Continue Farxiga 10 mg daily - Agree with increasing digoxin to 0.25 mg daily.  Recheck level in 5-7 days. - Agree with discontinuing diltiazem with new reduced EF   2) Patient assistance: - Prior authorizations approved for Farxiga and Delene Loll - Farxiga copay $4 per month; Entresto copay $4 per month - HF TOC appt made for 7/26   Kerby Nora, PharmD, BCPS Heart Failure Stewardship Pharmacist Phone [phone]

## 2021-04-04 NOTE — Discharge Summary (Signed)
Physician Discharge Summary  [name]:[id number] DOB: [birth year] DOA: 03/28/2021  PCP: [name], MD  Admit date: 03/28/2021 Discharge date: 04/04/2021  Admitted From: home Disposition:  home  Recommendations for Outpatient Follow-up:  Follow up with PCP in 1-2 weeks Please obtain BMP/CBC in one week Follow-up with cardiology as an outpatient  Home Health: none Equipment/Devices: none  Discharge Condition: stable CODE STATUS: Full code Diet recommendation: low sodium heart healthy  HPI: Per admitting MD, [name] is a 55 y.o. female with medical history significant of atrial fibrillation on anticoagulation, CVA/TIA, and hypertension presents with complaints of shortness of breath acutely worsened over the last 2 days.  Patient reports that earlier this week she had been needing to sit and rest in order to catch her breath while getting up and moving around.  She chronically has palpitations related to her atrial fibrillation.  At night patient had been having increased palpitations with laying on her left side, and complaints of or tachypnea with laying flat on her back.  She had been unable to sleep at night and had been sitting up.  Associated symptoms include weight gain of approximately 10 pounds and ankle swelling.  Denies having any fever, chest pain, nausea, vomiting, diarrhea, abdominal pain, or dysuria symptoms.  Patient has been compliant with all of her medications.  She had taken her medications of Cardizem and metoprolol prior to leaving the house this morning around 3 AM.  Patient admits that she had not taken her f as needed furosemide due to not knowing when to appropriately take the medicine.  Complains that she does have difficulty swallowing the Cardizem due to the pill feeling as though it gets stuck in her throat.  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 fib with RVR.  She underwent a 2D echo which showed EF 25-30%, TV regurgitation and moderate MV regurgitation.  Cardiology consulted and followed patient while hospitalized.  She initially was diuresed with IV Lasix with improvement in her condition.  Underwent a cardiac cath which showed 50-60% mid LAD, 70-80% distal circumflex, with plans for medical management.  She is currently on atorvastatin 80 mg, digoxin 0.25, furosemide 40 mg daily, metoprolol 100 mg twice daily and was also added on Farxiga as well as Entresto and spironolactone.  She had intermittent hypotension and Delene Loll has been placed on hold, she is stable for discharge, will go home with close outpatient follow-up with cardiology and Delene Loll can be resumed at a later time if blood pressure tolerates   Active Problems Paroxysmal A fib -Cardizem discontinued due to new lower ejection fraction and she was placed on metoprolol.  She is anticoagulated with Xarelto.  She is also on digoxin. Essential HTN -Continue home regimen as below.  Her HCTZ has been replaced with Lasix and spironolactone DM type 2 -well controlled, A1c 6.7.  Continue Farxiga OSA-Continue with CPAP. HLD-Continue with Lipitor. Hx CVA, TIA- Continue anticoagulation, statin Elevation troponin -due to demand in the setting of A fib with RVR  Sepsis ruled out   Discharge Instructions   Allergies as of 04/04/2021       Reactions   Penicillins Hives, Other (See Comments)   Unknown Has patient had a PCN reaction causing immediate rash, facial/tongue/throat swelling, SOB or lightheadedness with hypotension: No Has patient had a PCN  reaction causing severe rash involving mucus membranes or skin necrosis: No Has patient had a PCN reaction that required hospitalization No Has patient had a PCN reaction occurring within the  last 10 years: No If all of the above answers are "NO", then may proceed with Cephalosporin use.        Medication List     STOP taking these medications    Cartia XT 300 MG 24 hr capsule Generic drug: diltiazem   hydrochlorothiazide 12.5 MG capsule Commonly known as: MICROZIDE   metoprolol tartrate 25 MG tablet Commonly known as: LOPRESSOR       TAKE these medications    Accu-Chek FastClix Lancets Misc USE AS INSTRUCTED TO CHECK BLOOD SUGAR ONCE DAILY.   Accu-Chek Guide Me w/Device Kit 1 kit by Does not apply route daily.   Accu-Chek Guide test strip Generic drug: glucose blood USE AS INSTRUCTED TO CHECK BLOOD SUGAR ONCE DAILY.   acetaminophen 500 MG tablet Commonly known as: TYLENOL Take 1,000 mg by mouth every 6 (six) hours as needed for headache.   atorvastatin 80 MG tablet Commonly known as: LIPITOR Take 1 tablet (80 mg total) by mouth daily at 6 PM.   dapagliflozin propanediol 10 MG Tabs tablet Commonly known as: FARXIGA Take 1 tablet (10 mg total) by mouth daily.   digoxin 0.25 MG tablet Commonly known as: LANOXIN Take 1 tablet (0.25 mg total) by mouth daily.   furosemide 40 MG tablet Commonly known as: LASIX Take 1 tablet (40 mg total) by mouth daily. What changed:  medication strength how much to take how to take this when to take this   gabapentin 300 MG capsule Commonly known as: NEURONTIN TAKE 1 CAPSULE (300 MG TOTAL) BY MOUTH 2 (TWO) TIMES DAILY.   metoprolol succinate 50 MG 24 hr tablet Commonly known as: TOPROL-XL Take 3 tablets (150 mg total) by mouth daily.   Misc. Devices Misc Blood pressure monitor.  Diagnosis Hypertension   spironolactone 25 MG tablet Commonly known as: ALDACTONE Take 0.5 tablets (12.5 mg total) by mouth daily.   Xarelto 20 MG Tabs tablet Generic drug: rivaroxaban TAKE 1 TABLET (20 MG TOTAL) BY MOUTH DAILY WITH SUPPER.       ASK your doctor about these medications    cyclobenzaprine 5 MG  tablet Commonly known as: FLEXERIL TAKE 1 TABLET UP TO THREE TIMES DAILY AS NEEDED. DO NOT DRIVE, OPERATE MACHINERY, OR WORK WHILE TAKING THIS MEDICATION.         Consultations: Cardiology  Procedures/Studies: Cardiac cath  DG Chest 2 View  Result Date: 03/28/2021 CLINICAL DATA:  56 year old female with shortness of breath and lower extremity swelling. EXAM: CHEST - 2 VIEW COMPARISON:  Chest radiograph 04/10/2020 and earlier. FINDINGS: Chronic cardiomegaly. Stable cardiac size and mediastinal contours. Visualized tracheal air column is within normal limits. Lung volumes and vascularity appear to be at baseline. No pneumothorax, pleural effusion or acute pulmonary opacity. No acute osseous abnormality identified. Negative visible bowel gas pattern. IMPRESSION: Cardiomegaly and baseline pulmonary vascular congestion without overt edema. Electronically Signed   By: Genevie Ann M.D.   On: 03/28/2021 06:09   CT Angio Chest PE W and/or Wo Contrast  Result Date: 03/28/2021 CLINICAL DATA:  55 year old female with history of shortness of breath for the past 2 days. Evaluate for pulmonary embolism. EXAM: CT ANGIOGRAPHY CHEST WITH CONTRAST TECHNIQUE: Multidetector CT imaging of the chest was performed using the standard protocol during bolus administration of intravenous contrast. Multiplanar CT image reconstructions and  MIPs were obtained to evaluate the vascular anatomy. CONTRAST:  23m OMNIPAQUE IOHEXOL 350 MG/ML SOLN COMPARISON:  Chest CT 03/22/2014. FINDINGS: Cardiovascular: No filling defects within the pulmonary arterial tree to suggest pulmonary embolism. Heart size is enlarged with severe right atrial dilatation. There is no significant pericardial fluid, thickening or pericardial calcification. There is aortic atherosclerosis, as well as atherosclerosis of the great vessels of the mediastinum and the coronary arteries, including calcified atherosclerotic plaque in the left main, left anterior  descending, left circumflex and right coronary arteries. Mediastinum/Nodes: No pathologically enlarged mediastinal or hilar lymph nodes. Esophagus is unremarkable in appearance. No axillary lymphadenopathy. Lungs/Pleura: Trace bilateral pleural effusions lying dependently. There is a background of ground-glass attenuation and scattered areas of mild interlobular septal thickening, suspicious for a background of mild interstitial pulmonary edema. 7 mm ground-glass attenuation nodule in the right upper lobe (axial image 47 of series 6) and several other smaller ground-glass attenuation nodules are noted, but nonspecific in the setting of presumed edema. No other larger more suspicious appearing pulmonary nodules or masses are noted. No confluent consolidative airspace disease. Upper Abdomen: Reflux of contrast material into the IVC and hepatic veins. Musculoskeletal: There are no aggressive appearing lytic or blastic lesions noted in the visualized portions of the skeleton. Review of the MIP images confirms the above findings. IMPRESSION: 1. No evidence of pulmonary embolism. 2. Cardiomegaly with evidence of congestive heart failure, including interstitial pulmonary edema and trace bilateral pleural effusions. 3. Ground-glass attenuation nodules measuring up to 7 mm in the right upper lobe. These are nonspecific, potentially related to underlying edema. Non-contrast chest CT at 3-6 months is recommended. If nodules persist, subsequent management will be based upon the most suspicious nodule(s). This recommendation follows the consensus statement: Guidelines for Management of Incidental Pulmonary Nodules Detected on CT Images: From the Fleischner Society 2017; Radiology 2017; 284:228-243. 4. Aortic atherosclerosis, in addition to left main and 3 vessel coronary artery disease. Please note that although the presence of coronary artery calcium documents the presence of coronary artery disease, the severity of this  disease and any potential stenosis cannot be assessed on this non-gated CT examination. Assessment for potential risk factor modification, dietary therapy or pharmacologic therapy may be warranted, if clinically indicated. Aortic Atherosclerosis (ICD10-I70.0). Electronically Signed   By: DVinnie LangtonM.D.   On: 03/28/2021 08:10   CARDIAC CATHETERIZATION  Result Date: 04/02/2021 60% mid LAD after second diagonal. 75% distal circumflex before small third obtuse marginal. Right dominant anatomy Mild pulmonary hypertension with mean pressure 31 mmHg, mean capillary wedge pressure 16 mmHg, pulmonary vascular resistance 2.56 Wood units. Pulmonary artery O2 saturation 73%, cardiac output and index are 5.87 L/min and 3.01 L/min/m respectively. RECOMMENDATIONS: Guideline directed therapy for systolic dysfunction Rate control Further management per treating team.   ECHOCARDIOGRAM COMPLETE  Result Date: 03/28/2021    ECHOCARDIOGRAM REPORT   Patient Name:   Chareese RTHECLA FORGIONEDate of Exam: 03/28/2021 Medical Rec #:  [id number]    Height:       64.0 in Accession #:    [id number]   Weight:       222.0 lb Date of Birth:  [birth year]     BSA:          2.045 m Patient Age:    89years      BP:           86/55 mmHg Patient Gender: F  HR:           67 bpm. Exam Location:  Inpatient Procedure: 2D Echo, Cardiac Doppler, Color Doppler and Intracardiac            Opacification Agent Indications:     CHF-Acute Diastolic E67.54  History:         Patient has prior history of Echocardiogram examinations, most                  recent 05/09/2019. Stroke and TIA, Arrythmias:NSVT,                  Signs/Symptoms:Murmur; Risk Factors:Hypertension.  Sonographer:     Darlina Sicilian RDCS Referring Phys:  [id number] [place] Diagnosing Phys: Loralie Champagne MD IMPRESSIONS  1. Left ventricular ejection fraction, by estimation, is 25 to 30%. The left ventricle has severely decreased function. The left ventricle demonstrates global  hypokinesis. There is mild left ventricular hypertrophy. Left ventricular diastolic parameters  are indeterminate.  2. Right ventricular systolic function is mildly reduced. The right ventricular size is moderately enlarged. There is normal pulmonary artery systolic pressure. The estimated right ventricular systolic pressure is 71.2 mmHg.  3. Left atrial size was mildly dilated.  4. Right atrial size was moderately dilated.  5. The mitral valve is normal in structure. Moderate mitral valve regurgitation. No evidence of mitral stenosis.  6. Tricuspid valve regurgitation is severe. Suspect functional from annular dilatation.  7. The aortic valve is tricuspid. Aortic valve regurgitation is mild. No aortic stenosis is present.  8. The inferior vena cava is dilated in size with >50% respiratory variability, suggesting right atrial pressure of 8 mmHg.  9. The patient was in atrial fibrillation. 10. Significant change in LV and RV function compared to prior echo. FINDINGS  Left Ventricle: Left ventricular ejection fraction, by estimation, is 25 to 30%. The left ventricle has severely decreased function. The left ventricle demonstrates global hypokinesis. Definity contrast agent was given IV to delineate the left ventricular endocardial borders. The left ventricular internal cavity size was normal in size. There is mild left ventricular hypertrophy. Left ventricular diastolic parameters are indeterminate. Right Ventricle: The right ventricular size is moderately enlarged. No increase in right ventricular wall thickness. Right ventricular systolic function is mildly reduced. There is normal pulmonary artery systolic pressure. The tricuspid regurgitant velocity is 1.53 m/s, and with an assumed right atrial pressure of 8 mmHg, the estimated right ventricular systolic pressure is 19.7 mmHg. Left Atrium: Left atrial size was mildly dilated. Right Atrium: Right atrial size was moderately dilated. Pericardium: Trivial pericardial  effusion is present. Mitral Valve: The mitral valve is normal in structure. Moderate mitral valve regurgitation. No evidence of mitral valve stenosis. Tricuspid Valve: The tricuspid valve is normal in structure. Tricuspid valve regurgitation is severe. Aortic Valve: The aortic valve is tricuspid. Aortic valve regurgitation is mild. Aortic regurgitation PHT measures 667 msec. No aortic stenosis is present. Pulmonic Valve: The pulmonic valve was normal in structure. Pulmonic valve regurgitation is not visualized. Aorta: The aortic root is normal in size and structure. Venous: The inferior vena cava is dilated in size with greater than 50% respiratory variability, suggesting right atrial pressure of 8 mmHg. IAS/Shunts: No atrial level shunt detected by color flow Doppler.  LEFT VENTRICLE PLAX 2D LVIDd:         4.20 cm LVIDs:         3.30 cm LV PW:         1.30 cm LV IVS:  1.50 cm LVOT diam:     1.80 cm LV SV:         19 LV SV Index:   9 LVOT Area:     2.54 cm  LV Volumes (MOD) LV vol d, MOD A2C: 81.7 ml LV vol d, MOD A4C: 62.0 ml LV vol s, MOD A2C: 65.6 ml LV vol s, MOD A4C: 53.4 ml LV SV MOD A2C:     16.1 ml LV SV MOD A4C:     62.0 ml LV SV MOD BP:      12.0 ml RIGHT VENTRICLE RV S prime:     7.12 cm/s TAPSE (M-mode): 0.9 cm LEFT ATRIUM           Index       RIGHT ATRIUM           Index LA diam:      3.40 cm 1.66 cm/m  RA Area:     16.20 cm LA Vol (A2C): 32.9 ml 16.09 ml/m RA Volume:   38.60 ml  18.87 ml/m  AORTIC VALVE LVOT Vmax:   49.80 cm/s LVOT Vmean:  30.950 cm/s LVOT VTI:    0.076 m AI PHT:      667 msec  AORTA Ao Root diam: 2.80 cm Ao Asc diam:  2.70 cm MITRAL VALVE                 TRICUSPID VALVE MV Area (PHT): 5.75 cm      TR Peak grad:   9.4 mmHg MV Decel Time: 132 msec      TR Vmax:        153.00 cm/s MR Peak grad:    38.4 mmHg MR Mean grad:    24.0 mmHg   SHUNTS MR Vmax:         310.00 cm/s Systemic VTI:  0.08 m MR Vmean:        230.0 cm/s  Systemic Diam: 1.80 cm MR PISA:         1.57 cm MR  PISA Eff ROA: 17 mm MR PISA Radius:  0.50 cm MV E velocity: 81.65 cm/s Loralie Champagne MD Electronically signed by Loralie Champagne MD Signature Date/Time: 03/28/2021/2:30:39 PM    Final (Updated)      Subjective: - no chest pain, shortness of breath, no abdominal pain, nausea or vomiting.   Discharge Exam: BP (!) 85/61   Pulse 97   Temp 98 F (36.7 C) (Oral)   Resp (!) 34   Ht _0  (1.626 m)   Wt 87.1 kg   SpO2 95%   BMI 32.96 kg/m   General: Pt is alert, awake, not in acute distress Cardiovascular: irregular, no murmurs, no edema Respiratory: CTA bilaterally, no wheezing, no rhonchi Abdominal: Soft, NT, ND, bowel sounds + Extremities: no edema, no cyanosis    The results of significant diagnostics from this hospitalization (including imaging, microbiology, ancillary and laboratory) are listed below for reference.     Microbiology: Recent Results (from the past 240 hour(s))  Resp Panel by RT-PCR (Flu A&B, Covid) Nasopharyngeal Swab     Status: None   Collection Time: 03/28/21  7:48 AM   Specimen: Nasopharyngeal Swab; Nasopharyngeal(NP) swabs in vial transport medium  Result Value Ref Range Status   SARS Coronavirus 2 by RT PCR NEGATIVE NEGATIVE Final    Comment: (NOTE) SARS-CoV-2 target nucleic acids are NOT DETECTED.  The SARS-CoV-2 RNA is generally detectable in upper respiratory specimens during the acute phase of infection. The lowest concentration  of SARS-CoV-2 viral copies this assay can detect is 138 copies/mL. A negative result does not preclude SARS-Cov-2 infection and should not be used as the sole basis for treatment or other patient management decisions. A negative result may occur with  improper specimen collection/handling, submission of specimen other than nasopharyngeal swab, presence of viral mutation(s) within the areas targeted by this assay, and inadequate number of viral copies(<138 copies/mL). A negative result must be combined with clinical  observations, patient history, and epidemiological information. The expected result is Negative.  Fact Sheet for Patients:  EntrepreneurPulse.com.au  Fact Sheet for Healthcare Providers:  IncredibleEmployment.be  This test is no t yet approved or cleared by the [place] FDA and  has been authorized for detection and/or diagnosis of SARS-CoV-2 by FDA under an Emergency Use Authorization (EUA). This EUA will remain  in effect (meaning this test can be used) for the duration of the COVID-19 declaration under Section 564(b)(1) of the Act, 21 U.S.C.section 360bbb-3(b)(1), unless the authorization is terminated  or revoked sooner.       Influenza A by PCR NEGATIVE NEGATIVE Final   Influenza B by PCR NEGATIVE NEGATIVE Final    Comment: (NOTE) The Xpert Xpress SARS-CoV-2/FLU/RSV plus assay is intended as an aid in the diagnosis of influenza from Nasopharyngeal swab specimens and should not be used as a sole basis for treatment. Nasal washings and aspirates are unacceptable for Xpert Xpress SARS-CoV-2/FLU/RSV testing.  Fact Sheet for Patients: EntrepreneurPulse.com.au  Fact Sheet for Healthcare Providers: IncredibleEmployment.be  This test is not yet approved or cleared by the [place] FDA and has been authorized for detection and/or diagnosis of SARS-CoV-2 by FDA under an Emergency Use Authorization (EUA). This EUA will remain in effect (meaning this test can be used) for the duration of the COVID-19 declaration under Section 564(b)(1) of the Act, 21 U.S.C. section 360bbb-3(b)(1), unless the authorization is terminated or revoked.  Performed at [facility], [place] [address], [place], [place] [address]   MRSA Next Gen by PCR, Nasal     Status: None   Collection Time: 03/28/21 11:05 AM   Specimen: Nasal Mucosa; Nasal Swab  Result Value Ref Range Status   MRSA by PCR Next Gen NOT DETECTED NOT  DETECTED Final    Comment: (NOTE) The GeneXpert MRSA Assay (FDA approved for NASAL specimens only), is one component of a comprehensive MRSA colonization surveillance program. It is not intended to diagnose MRSA infection nor to guide or monitor treatment for MRSA infections. Test performance is not FDA approved in patients less than 59 years old. Performed at [facility], [place] [address], [place],  [address]      Labs: Basic Metabolic Panel: Recent Labs  Lab 03/29/21 0541 03/29/21 0902 03/31/21 0018 04/01/21 0007 04/02/21 0025 04/02/21 0981 04/02/21 1914 04/02/21 0826 04/03/21 0043 04/04/21 0039  NA 136   < > 137 136 138 143 139 143 138 135  K 5.2*   < > 3.8 3.7 3.4* 3.8 3.7 3.8 4.1 3.9  CL 107   < > 106 103 103  --   --   --  106 104  CO2 18*   < > _0 --   --   --  25 21*  GLUCOSE 116*   < > 104* 151* 91  --   --   --  100* 110*  BUN 20   < > _1 --   --   --  8 10  CREATININE 1.53*   < > 0.83 1.08* 0.94  --   --   --  0.89 0.89  CALCIUM 8.9   < > 8.4* 8.7* 9.0  --   --   --  9.0 9.1  MG 2.1  --   --   --   --   --   --   --   --   --   PHOS 4.6  --   --   --   --   --   --   --   --   --    < > = values in this interval not displayed.   Liver Function Tests: No results for input(s): AST, ALT, ALKPHOS, BILITOT, PROT, ALBUMIN in the last 168 hours. CBC: Recent Labs  Lab 04/01/21 0007 04/02/21 0025 04/02/21 0819 04/02/21 0824 04/02/21 0826 04/03/21 0043  WBC 10.4 8.8  --   --   --  8.3  HGB 12.5 12.5 13.6 13.3 13.6 13.2  HCT 39.6 41.0 40.0 39.0 40.0 42.5  MCV 81.8 82.2  --   --   --  81.3  PLT 248 295  --   --   --  279   CBG: Recent Labs  Lab 03/31/21 1134  GLUCAP 106*   Hgb A1c No results for input(s): HGBA1C in the last 72 hours. Lipid Profile No results for input(s): CHOL, HDL, LDLCALC, TRIG, CHOLHDL, LDLDIRECT in the last 72 hours. Thyroid function studies No results for input(s): TSH, T4TOTAL, T3FREE, THYROIDAB in the  last 72 hours.  Invalid input(s): FREET3 Urinalysis    Component Value Date/Time   COLORURINE YELLOW 11/02/2015 [place] 11/02/2015 1136   LABSPEC 1.016 11/02/2015 1136   PHURINE 6.0 11/02/2015 1136   GLUCOSEU NEGATIVE 11/02/2015 1136   HGBUR NEGATIVE 11/02/2015 1136   BILIRUBINUR negative 03/22/2020 1543   KETONESUR negative 03/22/2020 1543   KETONESUR NEGATIVE 11/02/2015 1136   PROTEINUR NEGATIVE 11/02/2015 1136   UROBILINOGEN 1.0 03/22/2020 1543   UROBILINOGEN 1 03/22/2015 1301   NITRITE Negative 03/22/2020 1543   NITRITE NEGATIVE 11/02/2015 1136   LEUKOCYTESUR Moderate (2+) (A) 03/22/2020 1543    FURTHER DISCHARGE INSTRUCTIONS:   Get Medicines reviewed and adjusted: Please take all your medications with you for your next visit with your Primary MD   Laboratory/radiological data: Please request your Primary MD to go over all hospital tests and procedure/radiological results at the follow up, please ask your Primary MD to get all Hospital records sent to his/her office.   In some cases, they will be blood work, cultures and biopsy results pending at the time of your discharge. Please request that your primary care M.D. goes through all the records of your hospital data and follows up on these results.   Also Note the following: If you experience worsening of your admission symptoms, develop shortness of breath, life threatening emergency, suicidal or homicidal thoughts you must seek medical attention immediately by calling 911 or calling your MD immediately  if symptoms less severe.   You must read complete instructions/literature along with all the possible adverse reactions/side effects for all the Medicines you take and that have been prescribed to you. Take any new Medicines after you have completely understood and accpet all the possible adverse reactions/side effects.    Do not drive when taking Pain medications or sleeping medications  (Benzodaizepines)   Do not take more than prescribed Pain, Sleep and Anxiety Medications. It is not  advisable to combine anxiety,sleep and pain medications without talking with your primary care practitioner   Special Instructions: If you have smoked or chewed Tobacco  in the last 2 yrs please stop smoking, stop any regular Alcohol  and or any Recreational drug use.   Wear Seat belts while driving.   Please note: You were cared for by a hospitalist during your hospital stay. Once you are discharged, your primary care physician will handle any further medical issues. Please note that NO REFILLS for any discharge medications will be authorized once you are discharged, as it is imperative that you return to your primary care physician (or establish a relationship with a primary care physician if you do not have one) for your post hospital discharge needs so that they can reassess your need for medications and monitor your lab values.  Time coordinating discharge: 40 minutes  SIGNED:  Marzetta Board, MD, PhD 04/04/2021, 9:40 AM

## 2021-04-04 NOTE — Progress Notes (Signed)
Progress Note  Patient Name: [name] Date of Encounter: 04/04/2021  Primary Cardiologist: Sanda Klein, MD   Subjective   Feels well.   Inpatient Medications    Scheduled Meds:  atorvastatin  80 mg Oral q1800   dapagliflozin propanediol  10 mg Oral Daily   digoxin  0.25 mg Oral Daily   furosemide  40 mg Oral Daily   gabapentin  300 mg Oral BID   metoprolol succinate  150 mg Oral Daily   rivaroxaban  20 mg Oral Daily   sacubitril-valsartan  1 tablet Oral BID   sodium chloride flush  3 mL Intravenous Q12H   spironolactone  12.5 mg Oral Daily   Continuous Infusions:  sodium chloride     sodium chloride 10 mL/hr at 04/02/21 0549   sodium chloride     PRN Meds: sodium chloride, sodium chloride, acetaminophen, cyclobenzaprine, ondansetron (ZOFRAN) IV, oxyCODONE, sodium chloride flush   Vital Signs    Vitals:   04/03/21 2309 04/04/21 0349 04/04/21 0500 04/04/21 0722  BP:  98/66  (!) 85/61  Pulse: (!) 109 97    Resp: _0 Temp:  98.1 F (36.7 C)  98 F (36.7 C)  TempSrc:  Oral  Oral  SpO2:  95%  95%  Weight:   87.1 kg   Height:        Intake/Output Summary (Last 24 hours) at 04/04/2021 0908 Last data filed at 04/03/2021 2224 Gross per 24 hour  Intake 720 ml  Output --  Net 720 ml   Filed Weights   04/02/21 0300 04/03/21 0713 04/04/21 0500  Weight: 89.6 kg 86 kg 87.1 kg    Telemetry    Afib rates 80s to 100s, PVCs, 7 beat run NSVT - Personally Reviewed  ECG    7/14 - afib with normal rate 68, T wave inversions inferolateral, slightly more prominent than prior ECG on 06/15/20- Personally Reviewed  Physical Exam   GEN: No acute distress.   Neck: No JVD Cardiac: iRRR, no murmurs, rubs, or gallops.  Respiratory: Clear to auscultation bilaterally. GI: Soft, nontender, non-distended  MS: No edema; No deformity. Neuro:  Nonfocal  Psych: Normal affect    Labs    Chemistry Recent Labs  Lab 04/02/21 0025 04/02/21 0819 04/02/21 0826  04/03/21 0043 04/04/21 0039  NA 138   < > 143 138 135  K 3.4*   < > 3.8 4.1 3.9  CL 103  --   --  106 104  CO2 28  --   --  25 21*  GLUCOSE 91  --   --  100* 110*  BUN 9  --   --  8 10  CREATININE 0.94  --   --  0.89 0.89  CALCIUM 9.0  --   --  9.0 9.1  GFRNONAA >60  --   --  >60 >60  ANIONGAP 7  --   --  7 10   < > = values in this interval not displayed.     Hematology Recent Labs  Lab 04/01/21 0007 04/02/21 0025 04/02/21 2122 04/02/21 0824 04/02/21 0826 04/03/21 0043  WBC 10.4 8.8  --   --   --  8.3  RBC 4.84 4.99  --   --   --  5.23*  HGB 12.5 12.5   < > 13.3 13.6 13.2  HCT 39.6 41.0   < > 39.0 40.0 42.5  MCV 81.8 82.2  --   --   --  81.3  MCH 25.8* 25.1*  --   --   --  25.2*  MCHC 31.6 30.5  --   --   --  31.1  RDW 15.7* 15.5  --   --   --  15.4  PLT 248 295  --   --   --  279   < > = values in this interval not displayed.    Cardiac EnzymesNo results for input(s): TROPONINI in the last 168 hours. No results for input(s): TROPIPOC in the last 168 hours.   BNP No results for input(s): BNP, PROBNP in the last 168 hours.    DDimer No results for input(s): DDIMER in the last 168 hours.   Radiology    No results found.  Cardiac Studies   Echo: Personally reviewed - RV appears moderate-enlarged and mild-moderately reduced function.   1. Left ventricular ejection fraction, by estimation, is 25 to 30%. The  left ventricle has severely decreased function. The left ventricle  demonstrates global hypokinesis. There is mild left ventricular  hypertrophy. Left ventricular diastolic parameters   are indeterminate.   2. Right ventricular systolic function is mildly reduced. The right  ventricular size is moderately enlarged. There is normal pulmonary artery  systolic pressure. The estimated right ventricular systolic pressure is  82.9 mmHg.   3. Left atrial size was mildly dilated.   4. Right atrial size was moderately dilated.   5. The mitral valve is normal in  structure. Moderate mitral valve  regurgitation. No evidence of mitral stenosis.   6. Tricuspid valve regurgitation is severe. Suspect functional from  annular dilatation.   7. The aortic valve is tricuspid. Aortic valve regurgitation is mild. No  aortic stenosis is present.   8. The inferior vena cava is dilated in size with >50% respiratory  variability, suggesting right atrial pressure of 8 mmHg.   9. The patient was in atrial fibrillation.  10. Significant change in LV and RV function compared to prior echo.   Patient Profile     55 y.o. female with longstanding persistent atrial fibrillation, likely permanent, previously well rate controlled, now presenting with rapid ventricular response and acute systolic heart failure with marked reduction in LVEF, OSA noncompliant w CPAP, HTN, DM, HLP, with dense coronary artery calcifications on CTA Chest this admission.  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 No significant contribution of CAD, there is moderate mid and severe distal disease. - will rate control with metoprolol succinate 150 mg daily - dose dropped and consolidated due to hypotension.  Cr: 0.89 UOP yesterday: not charted, 200 cc Weight: Unreliable but 93.5 > 92.9 kg > 89.6 kg > 86 kg > 87.1 kg (98 kg at office visit 06/15/20) Net negative for admission: -4.7 L Diuretic plan: furosemide 40 mg po daily - dose for discharge Heart Failure Therapy ACE-I/ARB/ARNI: will hold til follow up due to hypotension, will try to arrange HF TOC follow up BB: Toprol 150 mg daily MRA: spironolactone 12.5 daily.   SGLT2I: Farxiga 10 mg daily  CAD: - Xarelto, not on ASA. - atorva 80 mg daily - BB as  above. -ARNI as above.  2. AFib:  - CHA2DS2-VASc 6 (history of stroke, female gender, DM, hypertension, CHF). - Uncontrolled OSA - will need to be compliant for best results. - on two agents at home, BB and CCB. CCB diltiazem stopped due to low EF. Digoxin 0.125 mg daily started, will increase dose to 0.25 mg due to low level, may assist with rate control. - xarelto. - Toprol as above.  3. OSA: Has not been compliant with treatment.  This may be contributing to be poorly controlled ventricular rates. Encouraged 100% compliance w CPAP. - c/o headache but notes tender temples as reason for not wearing mask. Will check esr/crp in AM to ensure no temporal arteritis playing a role in headaches or heart failure. Mildly elevated ESR/CRP, not compelling for giant cell arteritis but cannot fully exclude. Mildly elevated troponin on presentation also less suggestive of myocarditis associated with vasculitis. No visual symptoms and longstanding headache - could pursue outpatient neurology/rheumatology evaluation, or further workup per IM primary service if indicated.  4. DM: recent hemoglobin A1c 6.7%.  [place] started.  5. HTN: Well-controlled.  As outpatient she was also receiving diltiazem and hydrochlorothiazide, now just on beta-blockers plus loop diuretics. Spironolactone has been added for HF therapy. BP hypotensive to normal this admission.   6. HLD: atorvastatin 80 mg.   7. AKI: Resolved.  Discussed with primary service. Stable for hospital discharge from CV standpoint and f/u will be arrange.  Awaiting magnesium level for NSVT, if low would replete prior to discharge.  For questions or updates, please contact [place] Please consult [email or web address] for contact info under      Signed, Elouise Munroe, MD  04/04/2021, 9:08 AM

## 2021-04-04 NOTE — Consult Note (Signed)
   [facility] Inpatient Consult   04/04/2021  [name] 1966-06-12 [id number]  [place] Medicaid Patient: [name]  Patient screened for length of stay to assess for  post hospital transition.  Patient's plan can likely qualify for the chronic care management in her Managed Medicaid plan. Spoke with patient, HIPAA verified, regarding chronic care program with follow up with a nurse.  Patient verbally agreed for outreach and to know more about the post hospital follow up plan. Patient endorses [name] as her provider. Primary Care Provider:  [name], MD  Plan:  Referral made for MM for nursing follow up for chronic care management support.  For questions:  Natividad Brood, RN BSN [facility] Liaison  [phone] business mobile phone Toll free office [phone]  Fax number: [phone] [email or web address]

## 2021-04-05 ENCOUNTER — Telehealth: Payer: Self-pay

## 2021-04-05 ENCOUNTER — Telehealth: Payer: Self-pay | Admitting: Cardiovascular Disease

## 2021-04-05 ENCOUNTER — Encounter (HOSPITAL_COMMUNITY): Payer: Medicaid Other

## 2021-04-05 NOTE — Telephone Encounter (Signed)
Transition Care Management Unsuccessful Follow-up Telephone Call  Date of discharge and from where:  04/04/2021-[place]   Attempts:  1st Attempt  Reason for unsuccessful TCM follow-up call:  Left voice message

## 2021-04-05 NOTE — Telephone Encounter (Signed)
Attempted to call patient, no answer. Left nonspecific voicemail for patient to call office back.

## 2021-04-05 NOTE — Telephone Encounter (Signed)
Patient states she was in the hospital and was told by Dr. Margaretann Loveless to call the office if she needed a new CPAP. She states she would like a prescription for a face mask for her CPAP.

## 2021-04-05 NOTE — Telephone Encounter (Signed)
Transition Care Management Unsuccessful Follow-up Telephone Call   Date of discharge and from where:  04/04/2021-[place]    Attempts:  1st Attempt   Reason for unsuccessful TCM follow-up call:  Left voice message to call back this nurse. Name and contact provided.

## 2021-04-08 ENCOUNTER — Telehealth: Payer: Self-pay

## 2021-04-08 NOTE — Telephone Encounter (Signed)
Transition Care Management Follow-up Telephone Call Date of discharge and from where: 04/04/2021 from [facility] How have you been since you were released from the hospital? Pt stated that she is feeling well and does not have any questions or concerns.  Any questions or concerns? No  Items Reviewed: Did the pt receive and understand the discharge instructions provided? Yes  Medications obtained and verified? Yes  Other? No  Any new allergies since your discharge? No  Dietary orders reviewed? No Do you have support at home? Yes    Functional Questionnaire: (I = Independent and D = Dependent) ADLs: I  Bathing/Dressing- I  Meal Prep- I  Eating- I  Maintaining continence- I  Transferring/Ambulation- I  Managing Meds- I   Follow up appointments reviewed:  PCP Hospital f/u appt confirmed? No   Specialist Hospital f/u appt confirmed? Yes  [facility] on 04/09/2021 @ 11:00. Are transportation arrangements needed? No  If their condition worsens, is the pt aware to call PCP or go to the Emergency Dept.? Yes Was the patient provided with contact information for the PCP's office or ED? Yes Was to pt encouraged to call back with questions or concerns? Yes

## 2021-04-08 NOTE — Telephone Encounter (Signed)
Transition Care Management Unsuccessful Follow-up Telephone Call  Date of discharge and from where:  04/04/2021, [facility]   Attempts:  2nd Attempt  Reason for unsuccessful TCM follow-up call:  Left voice message on # [phone]  Need to schedule hospital follow up appointment

## 2021-04-09 ENCOUNTER — Encounter (HOSPITAL_COMMUNITY): Payer: Self-pay

## 2021-04-09 ENCOUNTER — Ambulatory Visit (HOSPITAL_COMMUNITY)
Admit: 2021-04-09 | Discharge: 2021-04-09 | Disposition: A | Discharge status: Home / Self Care | Payer: Medicaid Other | Source: Ambulatory Visit | Attending: Internal Medicine | Admitting: Internal Medicine

## 2021-04-09 ENCOUNTER — Other Ambulatory Visit: Payer: Self-pay

## 2021-04-09 ENCOUNTER — Telehealth: Payer: Self-pay

## 2021-04-09 VITALS — BP 118/90 | HR 91 | Wt 192.0 lb

## 2021-04-09 DIAGNOSIS — I5042 Chronic combined systolic (congestive) and diastolic (congestive) heart failure: Secondary | ICD-10-CM | POA: Insufficient documentation

## 2021-04-09 DIAGNOSIS — G4733 Obstructive sleep apnea (adult) (pediatric): Secondary | ICD-10-CM | POA: Insufficient documentation

## 2021-04-09 DIAGNOSIS — Z79899 Other long term (current) drug therapy: Secondary | ICD-10-CM | POA: Insufficient documentation

## 2021-04-09 DIAGNOSIS — Z9989 Dependence on other enabling machines and devices: Secondary | ICD-10-CM

## 2021-04-09 DIAGNOSIS — Z7901 Long term (current) use of anticoagulants: Secondary | ICD-10-CM | POA: Insufficient documentation

## 2021-04-09 DIAGNOSIS — Z8249 Family history of ischemic heart disease and other diseases of the circulatory system: Secondary | ICD-10-CM | POA: Diagnosis not present

## 2021-04-09 DIAGNOSIS — I110 Hypertensive heart disease with heart failure: Secondary | ICD-10-CM | POA: Insufficient documentation

## 2021-04-09 DIAGNOSIS — Z8673 Personal history of transient ischemic attack (TIA), and cerebral infarction without residual deficits: Secondary | ICD-10-CM | POA: Diagnosis not present

## 2021-04-09 DIAGNOSIS — I2510 Atherosclerotic heart disease of native coronary artery without angina pectoris: Secondary | ICD-10-CM | POA: Insufficient documentation

## 2021-04-09 DIAGNOSIS — I482 Chronic atrial fibrillation, unspecified: Secondary | ICD-10-CM | POA: Diagnosis not present

## 2021-04-09 DIAGNOSIS — Z7984 Long term (current) use of oral hypoglycemic drugs: Secondary | ICD-10-CM | POA: Insufficient documentation

## 2021-04-09 DIAGNOSIS — Z833 Family history of diabetes mellitus: Secondary | ICD-10-CM | POA: Diagnosis not present

## 2021-04-09 DIAGNOSIS — E119 Type 2 diabetes mellitus without complications: Secondary | ICD-10-CM | POA: Insufficient documentation

## 2021-04-09 DIAGNOSIS — Z87891 Personal history of nicotine dependence: Secondary | ICD-10-CM | POA: Diagnosis not present

## 2021-04-09 DIAGNOSIS — I5041 Acute combined systolic (congestive) and diastolic (congestive) heart failure: Secondary | ICD-10-CM

## 2021-04-09 LAB — BASIC METABOLIC PANEL
Anion gap: 8 (ref 5–15)
BUN: 10 mg/dL (ref 6–20)
CO2: 25 mmol/L (ref 22–32)
Calcium: 9.3 mg/dL (ref 8.9–10.3)
Chloride: 105 mmol/L (ref 98–111)
Creatinine, Ser: 0.92 mg/dL (ref 0.44–1.00)
GFR, Estimated: >60 mL/min (ref >60)
Glucose, Bld: 102 mg/dL — ABNORMAL HIGH (ref 70–99)
Potassium: 4.2 mmol/L (ref 3.5–5.1)
Sodium: 138 mmol/L (ref 135–145)

## 2021-04-09 LAB — BRAIN NATRIURETIC PEPTIDE: B Natriuretic Peptide: 269.5 pg/mL — ABNORMAL HIGH (ref 0.0–100.0)

## 2021-04-09 MED ORDER — LOSARTAN POTASSIUM 25 MG PO TABS
12.5000 mg | ORAL_TABLET | Freq: Every day | ORAL | 3 refills | Status: DC
  Filled 2021-04-09: qty 45, 90d supply, fill #0

## 2021-04-09 NOTE — Telephone Encounter (Signed)
Transition Care Management Follow-up Telephone Call Date of discharge and from where: 04/04/2021, [facility]  How have you been since you were released from the hospital? She said she is feeling okay.  Any questions or concerns? Yes - she is not sure that she wants to continue to receive care at [facility].  She spoke about a recent frustration with trying to obtain a refill of xarelto.  She said that she put in a refill request for xarelto on a Tuesday and when she came to pick up the medication at [place] on  Friday, she was told that it could not be refilled until she was seen by her provider. She was upset that she would need to be seen before receiving a refill of an important medication.  She was very worried about possible medical complications due to missing the medication.  She explained that she ended up getting the medication refilled by a cardiologist who was not familiar with her.   She also explained her frustrations that [facility] cancels appointments frequently and also switches in person appointments to tele/virtual appointments.   This CM acknowledged her concerns and apologized for the stress that is has caused her.  She said she will contact this CM after she decides where she wants to receive her primary care.  This CM  explained that there are other community care clinics available to her if she so chooses. She can check with her insurance company to inquire about additional options for primary care.    Items Reviewed: Did the pt receive and understand the discharge instructions provided? Yes  Medications obtained and verified? Yes  - she said she has all medications including the new ones and did not have any questions about the med regime at this time  Other? No  Any new allergies since your discharge? No  Do you have support at home?  Lives alone  Home Care and Equipment/Supplies: Were home health services ordered? no If so, what is the name of the agency? N/a  Has the  agency set up a time to come to the patient's home? not applicable Were any new equipment or medical supplies ordered?  No What is the name of the medical supply agency? N/a Were you able to get the supplies/equipment? not applicable Do you have any questions related to the use of the equipment or supplies? No  She has a BP monitor and glucometer at home. She is working on getting a new CPAP machine and needs to obtain a prescription from her cardiologist.   Functional Questionnaire: (I = Independent and D = Dependent) ADLs: independent    Follow up appointments reviewed:  PCP Hospital f/u appt confirmed?  Patient will call to schedule an appointment if she wishes to continue to receive her care at [facility] f/u appt confirmed? Yes  Scheduled to see [place]  on 04/09/2021.  Are transportation arrangements needed? No  If their condition worsens, is the pt aware to call PCP or go to the Emergency Dept.? Yes Was the patient provided with contact information for the PCP's office or ED? Yes Was to pt encouraged to call back with questions or concerns? Yes

## 2021-04-09 NOTE — Telephone Encounter (Signed)
From the discharge call:   She said she is feeling okay.   she is not sure that she wants to continue to receive care at [facility].  She spoke about a recent frustration with trying to obtain a refill of xarelto.  She said that she put in a refill request for xarelto on a Tuesday and when she came to pick up the medication at [place] [facility] on  Friday, she was told that it could not be refilled until she was seen by her provider. She was upset that she would need to be seen before receiving a refill of an important medication.  She was very worried about possible medical complications due to missing the medication.  She explained that she ended up getting the medication refilled by a cardiologist who was not familiar with her.    She also explained her frustrations that [facility] cancels appointments frequently and also switches in person appointments to tele/virtual appointments.    This CM acknowledged her concerns and apologized for the stress that is has caused her.  She said she will contact this CM after she decides where she wants to receive her primary care.  This CM  explained that there are other community care clinics available to her if she so chooses. She can check with her insurance company to inquire about additional options for primary care.    she said she has all medications including the new ones and did not have any questions about the med regime at this time  She has a BP monitor and glucometer at home. She is working on getting a new CPAP machine and needs to obtain a prescription from her cardiologist.

## 2021-04-09 NOTE — Progress Notes (Signed)
[facility]  PCP: [name] Primary Cardiologist:Croitoru, Mihai  HPI:  [name] is a 55 y.o.  female  with a PMH significant for Afib on Xarelto, HFpEF, CVA, OSA (AHI of 72.9/h), HTN, and T2DM, recently   Admissions back in 2014 and 2015 for aflutter/afib with RVR.  No real prior admissions for CHF.  EF always normal by ECHO.   She has a history of poor medication adherence on and off rate control and anticoagulation, had a stroke 2017 and required tpa was not taking her xarelto at the time, she self discontinued it 6 months prior to CVA.    03/28/21 presented to [facility] ED with worsening DOE, A. fib with RVR (HR 105-130s).BNP 325.5 vascular congestion on chest xray.  Started on IV diuretics.  ECHO repeated 03/28/21 EF 25-30%, global hypokinesis. This was a new reduction in her EF.   Taking 57m of spiro instead of 12.551m otherwise taking all meds as prescribed. Weight has been stable at home exactly the same as D/C weight by our scales.  No chest pain or dyspnea.  BP has been between 11161-096ystolic she had one day where she had a lower bp 90's over 60's.  Denies any dizziness, light headedness.  Denies edema or increased abdominal girth.   Staying very active, cleaning, shopping, tolerating activity very well.  Walked from the other side of the parking lot to here without dyspnea.    ROS: All systems negative except as listed in HPI, PMH and Problem List.  SH:  Social History   Socioeconomic History   Marital status: Single    Spouse name: Not on file   Number of children: Not on file   Years of education: Not on file   Highest education level: Not on file  Occupational History   Occupation: CoTraining and development officer  Employer: [employer] Occupation: in home health aide  Tobacco Use   Smoking status: Former    Years: 3.00    Types: Cigarettes    Quit date: 03/15/2013    Years since quitting: 8.0   Smokeless tobacco: Never  Vaping Use    Vaping Use: Never used  Substance and Sexual Activity   Alcohol use: Yes    Comment: drinks on the weekends,   2-3 beers   Drug use: No   Sexual activity: Not Currently    Birth control/protection: None  Other Topics Concern   Not on file  Social History Narrative   Not on file   Social Determinants of Health   Financial Resource Strain: Not on file  Food Insecurity: Not on file  Transportation Needs: Not on file  Physical Activity: Not on file  Stress: Not on file  Social Connections: Not on file  Intimate Partner Violence: Not on file    FH:  Family History  Problem Relation Age of Onset   Cancer Mother    Atrial fibrillation Mother    Breast cancer Mother    Hypertension Father    Atrial fibrillation Father    Peripheral vascular disease Father    Hypertension Other    Diabetes Other    Heart attack Neg Hx    Stroke Neg Hx    Rectal cancer Neg Hx    Colon cancer Neg Hx    Esophageal cancer Neg Hx    Stomach cancer Neg Hx     Past Medical History:  Diagnosis Date   [place]  [place]   Essential hypertension    Heart murmur    a. 10/2015 Echo: EF 60-65%, no rwma, mild AI/MR, sev dil LA/RA, PASP 10mHg.   Noncompliance    NSVT (nonsustained ventricular tachycardia) ([facility]    a. 10/2015 during admission for CVA/AF.   Persistent atrial fibrillation ([place]    a. CHA2DS2VASC = 4-->Xarelto;  b. 02/2016 Successful DCCV.  c. Recurrent fib   Pneumonia 2012 x 2; 2015   Stroke ([facility]    a. 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   Transient ischemic attack (TIA) 01/2013    Current Outpatient Medications  Medication Sig Dispense Refill   Accu-Chek FastClix Lancets MISC USE AS INSTRUCTED TO CHECK BLOOD SUGAR ONCE DAILY. 102 each 2   ACCU-CHEK GUIDE test strip USE AS INSTRUCTED TO CHECK BLOOD SUGAR ONCE DAILY. 100 strip 2   acetaminophen (TYLENOL) 500 MG tablet Take  1,000 mg by mouth every 6 (six) hours as needed for headache.      atorvastatin (LIPITOR) 80 MG tablet Take 1 tablet (80 mg total) by mouth daily at 6 PM. 30 tablet 6   Blood Glucose Monitoring Suppl (ACCU-CHEK GUIDE ME) w/Device KIT 1 kit by Does not apply route daily. 1 kit 0   cyclobenzaprine (FLEXERIL) 5 MG tablet TAKE 1 TABLET UP TO THREE TIMES DAILY AS NEEDED. DO NOT DRIVE, OPERATE MACHINERY, OR WORK WHILE TAKING THIS MEDICATION. (Patient taking differently: Take 5 mg by mouth 3 (three) times daily as needed for muscle spasms.) 10 tablet 0   dapagliflozin propanediol (FARXIGA) 10 MG TABS tablet Take 1 tablet (10 mg total) by mouth daily. 30 tablet 1   digoxin (LANOXIN) 0.25 MG tablet Take 1 tablet (0.25 mg total) by mouth daily. 30 tablet 1   furosemide (LASIX) 40 MG tablet Take 1 tablet (40 mg total) by mouth daily. 30 tablet 1   gabapentin (NEURONTIN) 300 MG capsule TAKE 1 CAPSULE (300 MG TOTAL) BY MOUTH 2 (TWO) TIMES DAILY. 90 capsule 0   metoprolol succinate (TOPROL-XL) 50 MG 24 hr tablet Take 3 tablets (150 mg total) by mouth daily. 90 tablet 1   Misc. Devices MISC Blood pressure monitor.  Diagnosis Hypertension 1 each 0   rivaroxaban (XARELTO) 20 MG TABS tablet TAKE 1 TABLET (20 MG TOTAL) BY MOUTH DAILY WITH SUPPER. 90 tablet 0   spironolactone (ALDACTONE) 25 MG tablet Take 0.5 tablets (12.5 mg total) by mouth daily. 15 tablet 1   No current facility-administered medications for this encounter.    Vitals:   04/09/21 1120  BP: 118/90  Pulse: 91  SpO2: 98%  Weight: 87.1 kg (192 lb)    PHYSICAL EXAM: Cardiac: JVD flat, Irregularly irregular with normal rate, clear s1 and s2, no murmurs, rubs or gallops, no LE edema Pulmonary: CTAB, not in distress Abdominal: non distended abdomen, soft and nontender Psych: Alert, conversant, in good spirits    ECG  Afib, rate 93  ASSESSMENT & PLAN: Chronic Combined Systolic and Diastolic CHF -hx normal EF until 03/2021, decrease in EF out  of proportion to degree of CAD suggestive of NICM -long history of poorly controlled HTN and Afib with severe LVH by prior ECHO 2012 with normal diastolic function -NYHA Class II symptoms, euvolemic on exam -Continue farxiga 10, spiro 12.5, toprol XL 150, lasix 40 daily -BP will not tolerate entresto, but should have room to add losartan 12.5 if she takes 12.5 of spiro instead of  25.  Will add losartan today.    CAD: -LHC 03/2021 with 60% mid LAD stenosis and 75% Distal Lcx disease -Continue xarelto, statin, GDMT above.    Afib -CHA2DS2-VASc 6 -continue xarelto, Toprol XL -reminded her of the importance of using CPAP nightly   OSA: -not using cpap -reminded her of the importance of using CPAP nightly, she is working on getting a new mask  T2DM: -well controlled, continue farxgia   Follow up with me in one month has FU with Dr. Sallyanne Kuster in 2-3 months also

## 2021-04-09 NOTE — Patient Instructions (Signed)
Great to see you! Start taking Losartan 12.5 mg daily at bedtime. Lab work today--we will call with abnormal values only. [facility] should call you back with information regarding your CPAP prescription to get updated mask.  If you do not hear anything call them or [place] back.

## 2021-04-10 DIAGNOSIS — I2510 Atherosclerotic heart disease of native coronary artery without angina pectoris: Secondary | ICD-10-CM | POA: Insufficient documentation

## 2021-04-10 NOTE — Telephone Encounter (Signed)
Patient scheduled to see Dr. Claiborne Billings on 10/10 and I have added her to the waitlist.

## 2021-04-10 NOTE — Telephone Encounter (Signed)
This patient will need appointment to be seen by either Dr Radford Pax or Dr Claiborne Billings. Dr Claiborne Billings read her sleep study in 2019, however the patient has never been seen by either one of them since starting on her CPAP. Message will be sent to the scheduler to contact the patient to schedule appointment.

## 2021-04-10 NOTE — Progress Notes (Signed)
[facility] Transitions of Care Clinic Heart Failure Pharmacist Encounter  PCP: [name], MD PCP-Cardiologist: Sanda Klein, MD  HPI:  55 yo F with PMH of afib, CHF, CVA, OSA, HTN, and T2DM. She presented to the ED on 03/28/21 with shortness of breath, orthopnea, and LE edema. She was not taking her PRN furosemide PTA due to not knowing when to appropriately take it. CXR was done and showed cardiomegaly and baseline pulmonary vascular congestion without overt edema. An ECHO was done on 7/14 and LVEF is now reduced to 25-30% (down from 60-65% in 04/2019) with mild LVH and mildly reduced RV systolic function. Also found to be in afib RVR. R/LHC done on 7/19 and found to have mild pulmonary hypertension, 60% stenosis in LAD and 70% stenosis in LCx. she was then discharged on 04/04/21.  Today, [name] presents to the [facility] for follow up. She denies shortness of breath, DOE, orthopnea, lightheadedness, dizziness, chest pain, or palpitations. Weight has been stable. She takes furosemide 40 mg daily. She has been taking spironolactone 25 mg daily (full tablet) instead of 12.5 mg (half a tablet) daily by mistake.  HF Medications: Furosemide 40 mg daily Metoprolol XL 150 mg daily Spironolactone 12.5 mg daily (has been taking 25 mg daily) Farxiga 10 mg daily Digoxin 0.25 mg daily  Pertinent Lab Values: Serum creatinine 0.92, BUN 10, Potassium 4.2, Sodium 138 Digoxin <0.2 on 7/21 (was taking 0.125 mg dose)  Vital Signs: Weight: 192 lbs (discharge weight: 192 lbs) Blood pressure: 118/90 Heart rate:  91  Medication Assistance / Insurance Benefits Check: Does the patient have prescription insurance?  Yes Type of insurance plan: Medicaid   Outpatient Pharmacy:  Current outpatient pharmacy: [facility] Was the [facility] pharmacy used to supply discharge medications? yes  If TOC pharmacy was used, were the refills transferred out to current pharmacy yet? 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 NYHA class II symptoms. Patient stated that she had been taking the whole dose of spironolactone instead of 12.5 mg. Digoxin levels were not taken d/t patient not taking medication this morning.  - Continue furosemide 40 mg daily - Continue metoprolol XL 150 mg daily - Start losartan 12.5 mg daily - Reduce spironolactone back to 12.5 mg daily. BMET today. - Continue Farxiga 10 mg daily - Continue digoxin 0.25 mg daily - unable to check appropriate trough level due to timing of last dose.  Plan: 1) Medication changes: - Reduce spironolactone back to 12.5 mg daily - Start losartan 12.5 mg daily   2) Patient Assistance: - Prior authorizations previously approved for Farxiga and Entresto - Farxiga copay $4 per month; Entresto copay $4 per month  3) Follow up: - Next appointment with HF TOC on 8/29 - Will need to recheck digoxin level at next visit  [place], [employer] [place], PharmD, BCPS Heart Failure Stewardship Pharmacist Phone [phone]

## 2021-04-11 ENCOUNTER — Other Ambulatory Visit: Payer: Self-pay | Admitting: Obstetrics and Gynecology

## 2021-04-11 ENCOUNTER — Other Ambulatory Visit: Payer: Self-pay

## 2021-04-11 NOTE — Patient Outreach (Signed)
Medicaid Managed Care   Nurse Care Manager Note  04/11/2021 Name:  [name] MRN:  [id number] DOB:  06/17/1966  [name] is an 54 y.o. year old female who is a primary patient of Newlin, Enobong, MD.  The Medicaid Managed Care Coordination team was consulted for assistance with:    Chronic healthcare management needs.  [name] was given information about Medicaid Managed Care Coordination team services today. [name] agreed to services and verbal consent obtained.  Engaged with patient by telephone for initial visit in response to provider referral for case management and/or care coordination services.   Assessments/Interventions:  Review of past medical history, allergies, medications, health status, including review of consultants reports, laboratory and other test data, was performed as part of comprehensive evaluation and provision of chronic care management services.  SDOH (Social Determinants of Health) assessments and interventions performed:   Care Plan  Allergies  Allergen Reactions   Penicillins Hives and Other (See Comments)    Unknown  Has patient had a PCN reaction causing immediate rash, facial/tongue/throat swelling, SOB or lightheadedness with hypotension: No Has patient had a PCN reaction causing severe rash involving mucus membranes or skin necrosis: No Has patient had a PCN reaction that required hospitalization No Has patient had a PCN reaction occurring within the last 10 years: No If all of the above answers are "NO", then may proceed with Cephalosporin use.    Medications Reviewed Today     Reviewed by Craft, Terri G, RN (Registered Nurse) on 04/11/21 at 1153  Med List Status: <None>   Medication Order Taking? Sig Documenting Provider Last Dose Status Informant  Accu-Chek FastClix Lancets MISC [id number] No USE AS INSTRUCTED TO CHECK BLOOD SUGAR ONCE DAILY. Newlin, Enobong, MD Taking Active Self  ACCU-CHEK GUIDE test strip [id number] No USE AS  INSTRUCTED TO CHECK BLOOD SUGAR ONCE DAILY. Newlin, Enobong, MD Taking Active Self  acetaminophen (TYLENOL) 500 MG tablet [id number] No Take 1,000 mg by mouth every 6 (six) hours as needed for headache.  [provider] Taking Active Self  Patient not taking:  Discontinued 03/28/21 1020 atorvastatin (LIPITOR) 80 MG tablet [id number] No Take 1 tablet (80 mg total) by mouth daily at 6 PM. Newlin, Enobong, MD Taking Active Self           Med Note (BERGER, TIFFANI S   Thu Mar 28, 2021  9:14 AM)    Blood Glucose Monitoring Suppl (ACCU-CHEK GUIDE ME) w/Device KIT [id number] No 1 kit by Does not apply route daily. Newlin, Enobong, MD Taking Active Self  Patient not taking:  Discontinued 03/28/21 1020 cyclobenzaprine (FLEXERIL) 5 MG tablet [id number] No TAKE 1 TABLET UP TO THREE TIMES DAILY AS NEEDED. DO NOT DRIVE, OPERATE MACHINERY, OR WORK WHILE TAKING THIS MEDICATION.  Patient taking differently: Take 5 mg by mouth 3 (three) times daily as needed for muscle spasms.   Couture, Cortni S, PA-C Taking Active Self  dapagliflozin propanediol (FARXIGA) 10 MG TABS tablet [id number] No Take 1 tablet (10 mg total) by mouth daily. Gherghe, Costin M, MD Taking Active   digoxin (LANOXIN) 0.25 MG tablet [id number] No Take 1 tablet (0.25 mg total) by mouth daily. Gherghe, Costin M, MD Taking Active   Patient not taking:  Discontinued 03/28/21 1020 furosemide (LASIX) 40 MG tablet [id number] No Take 1 tablet (40 mg total) by mouth daily. Gherghe, Costin M, MD Taking Active   gabapentin (NEURONTIN) 300 MG capsule [id number] No TAKE 1 CAPSULE (300 MG TOTAL)   BY MOUTH 2 (TWO) TIMES DAILY. [name], MD Taking Active Self  losartan (COZAAR) 25 MG tablet [id number]  Take 0.5 tablets (12.5 mg total) by mouth daily. Katherine Roan, MD  Active   Patient not taking:  Discontinued 03/28/21 1020          Med Note [name], TIFFANI S   Thu Mar 28, 2021  9:14 AM)    metoprolol succinate (TOPROL-XL) 50 MG 24 hr tablet [id number]  No Take 3 tablets (150 mg total) by mouth daily. Caren Griffins, MD Taking Active   Misc. Devices MISC [id number] No Blood pressure monitor.  Diagnosis Hypertension [name], MD Taking Active Self  rivaroxaban (XARELTO) 20 MG TABS tablet [id number] No TAKE 1 TABLET (20 MG TOTAL) BY MOUTH DAILY WITH SUPPER. Croitoru, Mihai, MD Taking Active Self  spironolactone (ALDACTONE) 25 MG tablet [id number] No Take 0.5 tablets (12.5 mg total) by mouth daily. Caren Griffins, MD Taking Active            Med Note Clydene Laming, Burnadette Peter   Tue Apr 09, 2021 11:22 AM) Patient reports taking whole tablet.            Patient Active Problem List   Diagnosis Date Noted   Coronary artery disease involving native coronary artery of native heart without angina pectoris 04/10/2021   Acute combined systolic and diastolic HF (heart failure) ([place]) 04/01/2021   Atrial fibrillation with RVR ([place]) 03/28/2021   Elevated troponin 03/28/2021   Longstanding persistent atrial fibrillation ([place]) 04/16/2020   Secondary hypercoagulable state ([place]) 04/16/2020   Edema leg 05/17/2019   Severe obesity (BMI 35.0-39.9) with comorbidity ([place]) 04/04/2019   Prediabetes 04/04/2019   Seasonal allergic rhinitis due to pollen 12/14/2018   Cough 12/14/2018   Chronic back pain 08/23/2018   OSA on CPAP 06/23/2018   Acute ischemic right MCA stroke ([place]) 02/08/2018   Headache 01/29/2017   Refractive errors 04/15/2016   Medical non-compliance 11/06/2015   Hyperlipidemia LDL goal <70 11/06/2015   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   NSVT (nonsustained ventricular tachycardia) ([place]) 11/06/2015   History of CVA (cerebrovascular accident)    Claudication of both lower extremities ([place]) 03/22/2015   Chronic atrial fibrillation ([place]) 04/12/2014   Pap smear for cervical cancer screening 03/28/2014   Cigarette smoker 06/15/2013   Ventral hernia 06/15/2013   Chronic anticoagulation 02/28/2013   Hx-TIA (transient ischemic attack) 02/05/13  02/15/2013   Essential hypertension 02/15/2013    Conditions to be addressed/monitored per PCP order:   chronic healthcare management needs, Afib, h/o CVA/TIA, HTN, CHF, DM2, OSA, HLD.  Care Plan : Managing Health Conditions  Updates made by Gayla Medicus, RN since 04/11/2021 12:00 AM     Problem: Health Promotion or Disease Self-Management (General Plan of Care)   Priority: Medium  Onset Date: 04/11/2021     Long-Range Goal: Self-Management of Current Health Conditions   Start Date: 08/16/2020  Expected End Date: 07/12/2021  Recent Progress: On track  Priority: Medium  Note:   Current Barriers:  Chronic Disease Management support and education needs related to Diabetes, A-fib and Stroke history, HTN, CHF, OSA, HLD  Nurse Case Manager Clinical Goal(s):  Over the next 30 days, patient will attend all scheduled medical appointments.  Patient to make appointment with PCP and GYN. Over the next 90 days, patient will demonstrate improved health management independence as evidenced by improving lab results, decreased Emergency room visits Over the next 30 days, patient will work with CM team pharmacist for  medication review. Over the next 60 days, the patient will demonstrate ongoing self health care management ability as evidenced by taking medications as prescribed, attending scheduled appointments, reaching out to her providers with any concerns or questions  Interventions:  Inter-disciplinary care team collaboration (see longitudinal plan of care) Evaluation of current treatment plan related to diabetes, CHF and history of CVA and patient's adherence to plan as established by provider. Advised patient to notify provider with any concerns, questions or changes in symptoms Reviewed medications with patient. Discussed plans with patient for ongoing care management follow up and provided patient with direct contact information for care management team Advised patient, providing education  and rationale, to monitor blood pressure daily and record, calling provider for findings outside established parameters.  Advised patient, providing education and rationale, to check cbg daily and record, calling provider for findings outside established parameters.   Pharmacy referral for medication review and education  Patient Goals/Self-Care Activities Over the next 60 days, patient will:  -Self administers medications as prescribed Attends all scheduled provider appointments Calls pharmacy for medication refills Performs ADL's independently Performs IADL's independently Calls provider office for new concerns or questions  Follow Up Plan: Telephone follow up appointment with Managed Medicaid care management team member scheduled for:  05/08/21 at 230.   Follow Up:  Patient agrees to Care Plan and Follow-up.  Plan: The Managed Medicaid care management team will reach out to the patient again over the next 30 days. and The patient has been provided with contact information for the Managed Medicaid care management team and has been advised to call with any health related questions or concerns.  Date/time of next scheduled RN care management/care coordination outreach:  05/08/21 at 230. 

## 2021-04-11 NOTE — Patient Instructions (Signed)
Hi [name], thank you for speaking with me today.  [name] was given information about Medicaid Managed Care team care coordination services as a part of their [place] Medicaid benefit. [name] verbally consented to engagement with the [facility] Managed Care team.   If you are experiencing a medical emergency, please call 911 or report to your local emergency department or urgent care.   If you have a non-emergency medical problem during routine business hours, please contact your provider's office and ask to speak with a nurse.   For questions related to your [facility], please call: [phone] or visit the homepage here: [email or web address]  If you would like to schedule transportation through your [facility], please call the following number at least 2 days in advance of your appointment: [phone].   Call the [place] at [phone], at any time, 24 hours a day, 7 days a week. If you are in danger or need immediate medical attention call 911.  If you would like help to quit smoking, call 1-800-QUIT-NOW 9121172194) OR Espaol: 1-855-Djelo-Ya HD:1601594) o para ms informacin haga clic aqu or Text READY to 200-400 to register via text  [name] - following are the goals we discussed in your visit today:   Goals Addressed             This Visit's Progress    Protect My Health       Timeframe:  Long-Range Goal Priority:  Medium Start Date:     04/11/21                        Expected End Date:    07/12/21                   Follow Up Date 05/12/2021    - schedule appointment for flu shot - schedule appointment for vaccines needed due to my age or health - schedule recommended health tests (blood work, mammogram, colonoscopy, pap test) - schedule and keep appointment for annual check-up    Why is this  important?   Screening tests can find diseases early when they are easier to treat.  Your doctor or nurse will talk with you about which tests are important for you.  Getting shots for common diseases like the flu and shingles will help prevent them.        Patient verbalizes understanding of instructions provided today.   The Managed Medicaid care management team will reach out to the patient again over the next 30 days.  The  Patient has been provided with contact information for the Managed Medicaid care management team and has been advised to call with any health related questions or concerns.   Aida Raider RN, BSN [place]  [facility] Curator - Managed Medicaid High Risk [phone].   Following is a copy of your plan of care:  Patient Care Plan: Managing Health Conditions     Problem Identified: Health Promotion or Disease Self-Management (General Plan of Care)   Priority: Medium  Onset Date: 04/11/2021     Long-Range Goal: Self-Management of Current Health Conditions   Start Date: 08/16/2020  Expected End Date: 07/12/2021  Recent Progress: On track  Priority: Medium  Note:   Current Barriers:  Chronic Disease Management support and education needs related to Diabetes, A-fib and Stroke history, HTN, CHF, OSA, HLD  Nurse Case Manager  Clinical Goal(s):  Over the next 30 days, patient will attend all scheduled medical appointments.  Patient to make appointment with PCP and GYN. Over the next 90 days, patient will demonstrate improved health management independence as evidenced by improving lab results, decreased Emergency room visits Over the next 30 days, patient will work with CM team pharmacist for medication review. Over the next 60 days, the patient will demonstrate ongoing self health care management ability as evidenced by taking medications as prescribed, attending scheduled appointments, reaching out to her providers with any  concerns or questions  Interventions:  Inter-disciplinary care team collaboration (see longitudinal plan of care) Evaluation of current treatment plan related to diabetes, CHF and history of CVA and patient's adherence to plan as established by provider. Advised patient to notify provider with any concerns, questions or changes in symptoms Reviewed medications with patient. Discussed plans with patient for ongoing care management follow up and provided patient with direct contact information for care management team Advised patient, providing education and rationale, to monitor blood pressure daily and record, calling provider for findings outside established parameters.  Advised patient, providing education and rationale, to check cbg daily and record, calling provider for findings outside established parameters.   Pharmacy referral for medication review and education  Patient Goals/Self-Care Activities Over the next 60 days, patient will:  -Self administers medications as prescribed Attends all scheduled provider appointments Calls pharmacy for medication refills Performs ADL's independently Performs IADL's independently Calls provider office for new concerns or questions  Follow Up Plan: Telephone follow up appointment with Managed Medicaid care management team member scheduled for:  05/08/21 at 230.

## 2021-04-15 ENCOUNTER — Other Ambulatory Visit: Payer: Self-pay

## 2021-04-15 NOTE — Patient Outreach (Signed)
Medicaid Managed Care    Pharmacy Note  04/15/2021 Name: [name] MRN: [id number] DOB: January 09, 1966  [name] is a 55 y.o. year old female who is a primary care patient of [name], MD. The [facility] team was consulted for assistance with disease management and care coordination needs.    Engaged with patient Engaged with patient by telephone for follow up visit in response to referral for case management and/or care coordination services.  Ms. Balbach was given information about Managed Medicaid Care Coordination team services today. [name] agreed to services and verbal consent obtained.   Objective:  Lab Results  Component Value Date   CREATININE 0.92 04/09/2021   CREATININE 0.89 04/04/2021   CREATININE 0.89 04/03/2021    Lab Results  Component Value Date   HGBA1C 6.7 (H) 03/28/2021       Component Value Date/Time   CHOL 111 03/29/2021 0036   CHOL 101 10/21/2019 1458   TRIG 71 03/29/2021 0036   HDL 18 (L) 03/29/2021 0036   HDL 36 (L) 10/21/2019 1458   CHOLHDL 6.2 03/29/2021 0036   VLDL 14 03/29/2021 0036   LDLCALC 79 03/29/2021 0036   LDLCALC 49 10/21/2019 1458    Other: (TSH, CBC, Vit D, etc.)  Clinical ASCVD: No  The ASCVD Risk score [name] DC Jr., et al., 2013) failed to calculate for the following reasons:   The patient has a prior MI or stroke diagnosis    Other: (CHADS2VASc if Afib, PHQ9 if depression, MMRC or CAT for COPD, ACT, DEXA)  BP Readings from Last 3 Encounters:  04/09/21 118/90  04/04/21 (!) 89/77  02/20/21 106/71    Assessment/Interventions: Review of patient past medical history, allergies, medications, health status, including review of consultants reports, laboratory and other test data, was performed as part of comprehensive evaluation and provision of chronic care management services.    AFib/CHF -ECHO was done on 7/14 and LVEF is now reduced to 25-30% (down from 60-65% in 04/2019) with mild LVH  and mildly reduced RV systolic function. Also found to be in afib RVR. R/LHC done on 7/19 and found to have mild pulmonary hypertension, 60% stenosis in LAD and 70% stenosis in LCx. she was then discharged on 04/04/21. BP: 04/15/21: 101/78 and 85 Weight: 04/15/21: 191.6 lbs Goal per patient: <195 Rate Control -Spironolactone 25mg  take  QD Rivaroxaban 20mg  QD Metoprolol 50mg  ER take 3QD Losartan 25mg  take 1/2QD Furosemide 40mg  QD Digoxin 0.25mg  QD Plan: Extensive time spent on counseling patient on blood pressure goal and impact of each antihypertensive medication on their blood pressure and risk reduction for CV disease.  Used analogies to explain the need for multiple antihypertensive medications to achieve BP goals and that it is often a silent disease with no symptoms.  At goal,  patient stable/ symptoms controlled    Type 2 DM Sugars:  04/15/21: 109, 119, never above 130 Farxiga 10mg  QD Tried/Failed: Metformin 500mg  BID Plan: Extensive time was spent counseling patient on the pathophysiology and risk for complications with uncontrolled Diabetes.  Used teach back method for counseling on ADA diabetic diet.  Time spent on explaining role of carbohydrates versus sugars impacting blood glucose levels and meal planning. Current exercise consists of walking 10 minutes three times a week, plan is to increase to 20 minutes 5 days a week and then after 2-3 weeks increase to 30 minute 5 days a week.  At goal,  patient stable/ symptoms controlled   Cholesterol  Atorvastatin 80mg  Plan: Over 15 minutes spent counseling patient on role of hyperlipidemia on heart disease and the impact of each lipid subclass with emphasis on LDL and heart disease risk.  Explained role of statins in prevention of CV disease complications and actual risk for adverse effects with statin treatment.  Counseled patient on genetic component placing them at risk for hyperlipidemia. At goal,  patient stable/ symptoms  controlled   Pain Pain Scale: 04/15/21: Didn't state pain scale today Gabapentin 300mg  BID PRN Cyclobenzaprine 5mg  PRN Tried/Failed: Methocarbamol 500mg  TID PRN Plan: At goal,  patient stable/ symptoms controlled    SDOH (Social Determinants of Health) assessments and interventions performed:    Care Plan  Allergies  Allergen Reactions   Penicillins Hives and Other (See Comments)    Unknown  Has patient had a PCN reaction causing immediate rash, facial/tongue/throat swelling, SOB or lightheadedness with hypotension: No Has patient had a PCN reaction causing severe rash involving mucus membranes or skin necrosis: No Has patient had a PCN reaction that required hospitalization No Has patient had a PCN reaction occurring within the last 10 years: No If all of the above answers are "NO", then may proceed with Cephalosporin use.    Medications Reviewed Today     Reviewed by Gayla Medicus, RN (Registered Nurse) on 04/11/21 at 1153  Med List Status: <None>   Medication Order Taking? Sig Documenting Provider Last Dose Status Informant  Accu-Chek FastClix Lancets MISC [id number] No USE AS INSTRUCTED TO CHECK BLOOD SUGAR ONCE DAILY. [name], MD Taking Active Self  ACCU-CHEK GUIDE test strip [id number] No USE AS INSTRUCTED TO CHECK BLOOD SUGAR ONCE DAILY. [name], MD Taking Active Self  acetaminophen (TYLENOL) 500 MG tablet [id number] No Take 1,000 mg by mouth every 6 (six) hours as needed for headache.  [provider] Taking Active Self  Patient not taking:  Discontinued 03/28/21 1020 atorvastatin (LIPITOR) 80 MG tablet [id number] No Take 1 tablet (80 mg total) by mouth daily at 6 PM. [name], MD Taking Active Self           Med Note [name], TIFFANI S   Thu Mar 28, 2021  9:14 AM)    Blood Glucose Monitoring Suppl (ACCU-CHEK GUIDE ME) w/Device KIT [id number] No 1 kit by Does not apply route daily. [name], MD Taking Active Self  Patient not taking:   Discontinued 03/28/21 1020 cyclobenzaprine (FLEXERIL) 5 MG tablet [id number] No TAKE 1 TABLET UP TO THREE TIMES DAILY AS NEEDED. DO NOT DRIVE, OPERATE MACHINERY, OR WORK WHILE TAKING THIS MEDICATION.  Patient taking differently: Take 5 mg by mouth 3 (three) times daily as needed for muscle spasms.   Couture, Cortni S, PA-C Taking Active Self  dapagliflozin propanediol (FARXIGA) 10 MG TABS tablet [id number] No Take 1 tablet (10 mg total) by mouth daily. Caren Griffins, MD Taking Active   digoxin (LANOXIN) 0.25 MG tablet [id number] No Take 1 tablet (0.25 mg total) by mouth daily. Caren Griffins, MD Taking Active   Patient not taking:  Discontinued 03/28/21 1020 furosemide (LASIX) 40 MG tablet [id number] No Take 1 tablet (40 mg total) by mouth daily. Caren Griffins, MD Taking Active   gabapentin (NEURONTIN) 300 MG capsule [id number] No TAKE 1 CAPSULE (300 MG TOTAL) BY MOUTH 2 (TWO) TIMES DAILY. [name], MD Taking Active Self  losartan (COZAAR) 25 MG tablet [id number]  Take 0.5 tablets (12.5 mg total) by mouth daily. Katherine Roan, MD  Active   Patient  not taking:  Discontinued 03/28/21 1020          Med Note (BERGER, TIFFANI S   Thu Mar 28, 2021  9:14 AM)    metoprolol succinate (TOPROL-XL) 50 MG 24 hr tablet [id number] No Take 3 tablets (150 mg total) by mouth daily. Caren Griffins, MD Taking Active   Misc. Devices MISC [id number] No Blood pressure monitor.  Diagnosis Hypertension [name], MD Taking Active Self  rivaroxaban (XARELTO) 20 MG TABS tablet [id number] No TAKE 1 TABLET (20 MG TOTAL) BY MOUTH DAILY WITH SUPPER. Croitoru, Mihai, MD Taking Active Self  spironolactone (ALDACTONE) 25 MG tablet [id number] No Take 0.5 tablets (12.5 mg total) by mouth daily. Caren Griffins, MD Taking Active            Med Note Clydene Laming, Burnadette Peter   Tue Apr 09, 2021 11:22 AM) Patient reports taking whole tablet.            Patient Active Problem List   Diagnosis Date Noted   Coronary  artery disease involving native coronary artery of native heart without angina pectoris 04/10/2021   Acute combined systolic and diastolic HF (heart failure) ([place]) 04/01/2021   Atrial fibrillation with RVR ([place]) 03/28/2021   Elevated troponin 03/28/2021   Longstanding persistent atrial fibrillation ([place]) 04/16/2020   Secondary hypercoagulable state ([place]) 04/16/2020   Edema leg 05/17/2019   Severe obesity (BMI 35.0-39.9) with comorbidity ([place]) 04/04/2019   Prediabetes 04/04/2019   Seasonal allergic rhinitis due to pollen 12/14/2018   Cough 12/14/2018   Chronic back pain 08/23/2018   OSA on CPAP 06/23/2018   Acute ischemic right MCA stroke (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   NSVT (nonsustained ventricular tachycardia) ([place]) 11/06/2015   History of CVA (cerebrovascular accident)    Claudication of both lower extremities ([place]) 03/22/2015   Chronic atrial fibrillation ([place]) 04/12/2014   Pap smear for cervical cancer screening 03/28/2014   Cigarette smoker 06/15/2013   Ventral hernia 06/15/2013   Chronic anticoagulation 02/28/2013   Hx-TIA (transient ischemic attack) 02/05/13 02/15/2013   Essential hypertension 02/15/2013    Conditions to be addressed/monitored: Atrial Fibrillation, CHF, HTN, Hypertriglyceridemia, and DM  Care Plan : Medication Management  Updates made by Lane Hacker, [place] since 04/15/2021 12:00 AM     Problem: Health Promotion or Disease Self-Management (General Plan of Care)      Goal: Medication management   Note:   Current Barriers:  Does not adhere to prescribed medication regimen Does not maintain contact with provider office Does not contact provider office for questions/concerns   Pharmacist Clinical Goal(s):  Over the next 90 days, patient will achieve adherence to monitoring guidelines and  medication adherence to achieve therapeutic efficacy contact provider office for questions/concerns as evidenced notation of same in electronic health record through collaboration with PharmD and provider.    Interventions: Inter-disciplinary care team collaboration (see longitudinal plan of care) Comprehensive medication review performed; medication list updated in electronic medical record      Patient Goals/Self-Care Activities Over the next 90 days, patient will:  - take medications as prescribed collaborate with provider on medication access solutions  Follow Up Plan: The patient has been provided with contact information for the care management team and has been advised to call with any health related questions or concerns.      Task: Mutually Develop and Royce Macadamia  Achievement of Patient Goals   Note:   Care Management Activities:    - verbalization of feelings encouraged    Notes:        Medication Assistance: None required. Patient affirms current coverage meets needs.   Follow up: Agree/   Plan: The patient has been provided with contact information for the care management team and has been advised to call with any health related questions or concerns.   [place] Constable, Pharm.D., Managed Medicaid Pharmacist - [phone]

## 2021-04-15 NOTE — Patient Instructions (Signed)
Visit Information  [name] was given information about Medicaid Managed Care team care coordination services as a part of their [place] Medicaid benefit. [name] verbally consented to engagement with the [facility] Managed Care team.   If you are experiencing a medical emergency, please call 911 or report to your local emergency department or urgent care.   If you have a non-emergency medical problem during routine business hours, please contact your provider's office and ask to speak with a nurse.   For questions related to your [facility], please call: [phone] or visit the homepage here: [email or web address]  If you would like to schedule transportation through your [facility], please call the following number at least 2 days in advance of your appointment: [phone].   Call the [facility] at [phone], at any time, 24 hours a day, 7 days a week. If you are in danger or need immediate medical attention call 911.  If you would like help to quit smoking, call 1-800-QUIT-NOW 830-395-1609) OR Espaol: 1-855-Djelo-Ya QO:409462) o para ms informacin haga clic aqu or Text READY to 200-400 to register via text  [name] - following are the goals we discussed in your visit today:   Goals Addressed   None     Please see education materials related to DM provided as print materials.   Patient verbalizes understanding of instructions provided today.   The  Patient has been provided with contact information for the Managed Medicaid care management team and has been advised to call with any health related questions or concerns.   Arizona Constable, Pharm.D., Managed Medicaid Pharmacist [phone]   Following is a copy of your plan of care:  Patient Care Plan: Managing Health Conditions     Problem Identified: Health  Promotion or Disease Self-Management (General Plan of Care)   Priority: Medium  Onset Date: 04/11/2021     Long-Range Goal: Self-Management of Current Health Conditions   Start Date: 08/16/2020  Expected End Date: 07/12/2021  Recent Progress: On track  Priority: Medium  Note:   Current Barriers:  Chronic Disease Management support and education needs related to Diabetes, A-fib and Stroke history, HTN, CHF, OSA, HLD  Nurse Case Manager Clinical Goal(s):  Over the next 30 days, patient will attend all scheduled medical appointments.  Patient to make appointment with PCP and GYN. Over the next 90 days, patient will demonstrate improved health management independence as evidenced by improving lab results, decreased Emergency room visits Over the next 30 days, patient will work with CM team pharmacist for medication review. Over the next 60 days, the patient will demonstrate ongoing self health care management ability as evidenced by taking medications as prescribed, attending scheduled appointments, reaching out to her providers with any concerns or questions  Interventions:  Inter-disciplinary care team collaboration (see longitudinal plan of care) Evaluation of current treatment plan related to diabetes, CHF and history of CVA and patient's adherence to plan as established by provider. Advised patient to notify provider with any concerns, questions or changes in symptoms Reviewed medications with patient. Discussed plans with patient for ongoing care management follow up and provided patient with direct contact information for care management team Advised patient, providing education and rationale, to monitor blood pressure daily and record, calling provider for findings outside established parameters.  Advised patient, providing education and rationale, to check cbg daily and record, calling provider for findings outside established parameters.  Pharmacy referral for medication review and  education  Patient Goals/Self-Care Activities Over the next 60 days, patient will:  -Self administers medications as prescribed Attends all scheduled provider appointments Calls pharmacy for medication refills Performs ADL's independently Performs IADL's independently Calls provider office for new concerns or questions  Follow Up Plan: Telephone follow up appointment with Managed Medicaid care management team member scheduled for:  05/08/21 at 230.      Patient Care Plan: Medication Management     Problem Identified: Health Promotion or Disease Self-Management (General Plan of Care)      Goal: Medication management   Note:   Current Barriers:  Does not adhere to prescribed medication regimen Does not maintain contact with provider office Does not contact provider office for questions/concerns   Pharmacist Clinical Goal(s):  Over the next 90 days, patient will achieve adherence to monitoring guidelines and medication adherence to achieve therapeutic efficacy contact provider office for questions/concerns as evidenced notation of same in electronic health record through collaboration with PharmD and provider.    Interventions: Inter-disciplinary care team collaboration (see longitudinal plan of care) Comprehensive medication review performed; medication list updated in electronic medical record      Patient Goals/Self-Care Activities Over the next 90 days, patient will:  - take medications as prescribed collaborate with provider on medication access solutions  Follow Up Plan: The patient has been provided with contact information for the care management team and has been advised to call with any health related questions or concerns.      Task: Mutually Develop and Royce Macadamia Achievement of Patient Goals   Note:   Care Management Activities:    - verbalization of feelings encouraged    Notes:

## 2021-04-23 ENCOUNTER — Telehealth: Payer: Self-pay | Admitting: Cardiovascular Disease

## 2021-04-23 ENCOUNTER — Encounter: Payer: Self-pay | Admitting: Cardiovascular Disease

## 2021-04-23 NOTE — Telephone Encounter (Signed)
Spoke with the patient. She stated that she needs a return to work letter from Dr. Sallyanne Kuster stating that she can return to work as of 8/10 from a cardiac standpoint without restrictions.

## 2021-04-23 NOTE — Telephone Encounter (Signed)
Patient called and said that she was seen in the ER by Dr. Sallyanne Kuster.  States that she needs a work clearance stating that she is clear to return to work. Please fill out forum asap via her request.

## 2021-04-24 NOTE — Telephone Encounter (Signed)
Letter printed and placed at front desk for pick. Called and made patient aware. Patient will come today to pick up. Advised patient to call back to office with any issues, questions, or concerns. Patient verbalized understanding.

## 2021-05-08 ENCOUNTER — Other Ambulatory Visit: Payer: Self-pay | Admitting: Obstetrics and Gynecology

## 2021-05-08 NOTE — Patient Instructions (Signed)
Hi Ms. Olszowy, I'm sorry I missed you today, hope you are doing well - as a part of your Medicaid benefit, you are eligible for care management and care coordination services at no cost or copay. I was unable to reach you by phone today but would be happy to help you with your health related needs. Please feel free to call me at [phone].  A member of the Managed Medicaid care management team will reach out to you again over the next 7-14 days.   Aida Raider RN, BSN [place]  [facility] - Managed Medicaid High Risk [phone].

## 2021-05-08 NOTE — Patient Outreach (Signed)
Care Coordination  05/08/2021  [name] April 30, 1966 [id number]   Medicaid Managed Care   Unsuccessful Outreach Note  05/08/2021 Name: [name] MRN: [id number] DOB: 06-04-1966  Referred by: [name], MD Reason for referral : High Risk Managed Medicaid (Unsuccessful telephone outreach)   An unsuccessful telephone outreach was attempted today. The patient was referred to the case management team for assistance with care management and care coordination.   Follow Up Plan: A member of the Managed Medicaid care management team will reach out to the patient again over the next 7-14 days.   Aida Raider RN, BSN [place]  [facility] - Managed Medicaid High Risk [phone].

## 2021-05-10 ENCOUNTER — Other Ambulatory Visit (HOSPITAL_COMMUNITY): Payer: Self-pay

## 2021-05-13 ENCOUNTER — Telehealth (HOSPITAL_COMMUNITY): Payer: Self-pay

## 2021-05-13 ENCOUNTER — Ambulatory Visit (HOSPITAL_COMMUNITY): Payer: Medicaid Other

## 2021-05-13 NOTE — Telephone Encounter (Signed)
Called to confirm Heart & Vascular Transitions of Care appointment at 2pm 8/30. Patient reminded to bring all medications and pill box organizer with them. Confirmed patient has transportation. Gave directions, instructed to utilize [place] parking.  Confirmed appointment prior to ending call.   Pricilla Holm, MSN, RN Heart Failure Nurse Navigator [phone]

## 2021-05-14 ENCOUNTER — Ambulatory Visit (HOSPITAL_COMMUNITY): Payer: Medicaid Other

## 2021-05-15 ENCOUNTER — Other Ambulatory Visit: Payer: Self-pay

## 2021-05-16 ENCOUNTER — Other Ambulatory Visit: Payer: Self-pay

## 2021-05-17 ENCOUNTER — Other Ambulatory Visit (HOSPITAL_COMMUNITY): Payer: Self-pay

## 2021-05-21 ENCOUNTER — Other Ambulatory Visit: Payer: Self-pay

## 2021-05-21 NOTE — Patient Outreach (Signed)
Onboarded patient to [facility]. Plan: Verbal consent obtained for [facility] enhanced pharmacy services (medication synchronization, adherence packaging, delivery coordination). A medication sync plan was created to allow patient to get all medications delivered once every 30 to 90 days per patient preference. Patient understands they have freedom to choose pharmacy and clinical pharmacist will coordinate care between all prescribers and [facility].   Medication Name                        (please note if Rx is PRN) Prescriber                                                                  (list Provider Name & Phone Number)                                  Timing    Refill Timing Last Fill Date & DS       (if last fill/DS unavailable, list pt.'s quantity on hand) Anticipated next due date    BB B L EM BT     TS/Lancets (Fast Clix and Guide) [name], [phone]      PRN 05/21/2021 06/24/21  Atorvastatin '80mg'$  [name], [phone]    1  Cycle Fill Never picked up 06/24/21  Dapagliflozin '10mg'$  Caren Griffins, MD - [phone]  1   Cycle Fill 04/04/21 for 30 days 06/24/21  Digoxin 0.'25mg'$  Caren Griffins, MD - [phone]  1   Cycle Fill 04/04/21 for 30 days 06/24/21  Furosemide '40mg'$  PRN Caren Griffins, MD - [phone]  1   Cycle Fill 04/04/21 for 30 days 06/24/21  Gabapentin '300mg'$  BID [name], [phone]      PRN 01/03/21 for 90 days 06/04/21  Losartan '25mg'$  take 1/2QD Caren Griffins, MD - [phone]   0.5  Cycle Fill 04/09/21 for 90 days 07/08/21  Metoprolol '50mg'$  ER take '150mg'$  QD (Can we do one '100mg'$  and one '50mg'$  or two '75mg'$ ) Caren Griffins, MD - [phone]  1   Cycle Fill 04/04/21 for 30 days 06/04/21  Rivaroxaban '20mg'$  [name], [phone]    1  Cycle Fill 03/20/21 for 90 days 06/18/21  Spironolactone '25mg'$  take 1/2 tablet Caren Griffins, MD - [phone]  0.5   Cycle Fill 04/04/21 for 30 days 06/24/21  Metformin '500mg'$  BID [name],  [phone]   1 1  Cycle Fill 05/21/2021 for 30 days 06/24/21  Alcohol Wipes [name], [phone]       05/21/2021 06/24/21

## 2021-05-22 ENCOUNTER — Other Ambulatory Visit: Payer: Self-pay

## 2021-05-22 ENCOUNTER — Other Ambulatory Visit: Payer: Self-pay | Admitting: Family Medicine

## 2021-05-22 DIAGNOSIS — E119 Type 2 diabetes mellitus without complications: Secondary | ICD-10-CM

## 2021-05-22 DIAGNOSIS — R29898 Other symptoms and signs involving the musculoskeletal system: Secondary | ICD-10-CM

## 2021-05-23 ENCOUNTER — Other Ambulatory Visit: Payer: Self-pay

## 2021-05-24 ENCOUNTER — Other Ambulatory Visit: Payer: Self-pay

## 2021-05-24 ENCOUNTER — Other Ambulatory Visit: Payer: Self-pay | Admitting: *Deleted

## 2021-05-24 DIAGNOSIS — I482 Chronic atrial fibrillation, unspecified: Secondary | ICD-10-CM

## 2021-05-24 DIAGNOSIS — E119 Type 2 diabetes mellitus without complications: Secondary | ICD-10-CM

## 2021-05-24 MED ORDER — ATORVASTATIN CALCIUM 80 MG PO TABS: 80.0000 mg | ORAL_TABLET | Freq: Every day | ORAL | 2 refills | Status: DC

## 2021-05-24 MED ORDER — METOPROLOL SUCCINATE ER 50 MG PO TB24: 50.0000 mg | ORAL_TABLET | Freq: Every day | ORAL | 2 refills | Status: DC

## 2021-05-24 MED ORDER — FUROSEMIDE 40 MG PO TABS
40.0000 mg | ORAL_TABLET | Freq: Every day | ORAL | 2 refills | Status: DC
  Filled 2021-07-09: qty 30, 30d supply, fill #0

## 2021-05-24 MED ORDER — LOSARTAN POTASSIUM 25 MG PO TABS: 12.5000 mg | ORAL_TABLET | Freq: Every day | ORAL | 2 refills | Status: DC

## 2021-05-24 MED ORDER — SPIRONOLACTONE 25 MG PO TABS: 12.5000 mg | ORAL_TABLET | Freq: Every day | ORAL | 2 refills | Status: DC

## 2021-05-24 MED ORDER — RIVAROXABAN 20 MG PO TABS: ORAL_TABLET | Freq: Every day | ORAL | 2 refills | Status: DC

## 2021-05-24 MED ORDER — METOPROLOL SUCCINATE ER 100 MG PO TB24: 100.0000 mg | ORAL_TABLET | Freq: Every day | ORAL | 2 refills | Status: DC

## 2021-05-24 MED ORDER — DIGOXIN 250 MCG PO TABS: 0.2500 mg | ORAL_TABLET | Freq: Every day | ORAL | 2 refills | Status: DC

## 2021-05-24 NOTE — Progress Notes (Signed)
Per Dr. Sallyanne Kuster, refills sent for all cardiac medications to [facility]. Metoprolol Succinate 150 mg (3- 50 mg tablets) changed to Metoprolol Succinate 100 and Metoprolol Succinate 50 mg to equal the 150 mg once daily

## 2021-05-27 ENCOUNTER — Other Ambulatory Visit: Payer: Self-pay | Admitting: Family Medicine

## 2021-05-27 DIAGNOSIS — E119 Type 2 diabetes mellitus without complications: Secondary | ICD-10-CM

## 2021-05-27 DIAGNOSIS — R29898 Other symptoms and signs involving the musculoskeletal system: Secondary | ICD-10-CM

## 2021-05-27 MED ORDER — ACCU-CHEK GUIDE VI STRP: ORAL_STRIP | 0 refills | Status: DC

## 2021-05-27 MED ORDER — ACCU-CHEK FASTCLIX LANCETS MISC: 0 refills | Status: DC

## 2021-05-27 MED ORDER — GABAPENTIN 300 MG PO CAPS: ORAL_CAPSULE | Freq: Two times a day (BID) | ORAL | 0 refills | Status: DC

## 2021-05-27 NOTE — Telephone Encounter (Signed)
Copied from [place] [phone]. Topic: Quick Communication - Rx Refill/Question >> May 27, 2021  9:39 AM Leward Quan A wrote: Medication: ACCU-CHEK GUIDE test strip, Accu-Chek FastClix Lancets MISC,  gabapentin (NEURONTIN) 300 MG capsule, dapagliflozin propanediol (FARXIGA) 10 MG TABS tablet, metFORMIN (GLUCOPHAGE) 500 MG tablet, Alcohol pads  Has the patient contacted their pharmacy? Yes.   (Agent: If no, request that the patient contact the pharmacy for the refill.) (Agent: If yes, when and what did the pharmacy advise?)  Preferred Pharmacy (with phone number or street name): [facility] - [place], [place] - [address]  Phone:  [phone] Fax:  [phone]     Agent: Please be advised that RX refills may take up to 3 business days. We ask that you follow-up with your pharmacy.

## 2021-05-27 NOTE — Telephone Encounter (Signed)
Requested Prescriptions  Pending Prescriptions Disp Refills  . Accu-Chek FastClix Lancets MISC 102 each 0    Sig: USE AS INSTRUCTED TO CHECK BLOOD SUGAR ONCE DAILY.     Endocrinology: Diabetes - Testing Supplies Passed - 05/27/2021  6:24 PM      Passed - Valid encounter within last 12 months    Recent Outpatient Visits          11 months ago Annual physical exam   [place] [name], MD   1 year ago Urinary tract infection without hematuria, site unspecified   [place], [place], [place]   1 year ago Type 2 diabetes mellitus without complication, without long-term current use of insulin ([place])   [place], Enobong, MD   1 year ago Chronic atrial fibrillation [facility])   [place], Charlane Ferretti, MD   1 year ago Type 2 diabetes mellitus without complication, without long-term current use of insulin ([place])   [place] [name], MD      Future Appointments            In 2 weeks [name], MD [place]   In 4 weeks Troy Sine, MD [facility] [place], [facility]   In 1 month Croitoru, [place], MD [facility], [facility]           . glucose blood (ACCU-CHEK GUIDE) test strip 100 strip 0    Sig: Use as instructed     Endocrinology: Diabetes - Testing Supplies Passed - 05/27/2021  6:24 PM      Passed - Valid encounter within last 12 months    Recent Outpatient Visits          11 months ago Annual physical exam   [place], Enobong, MD   1 year ago Urinary tract infection without hematuria, site unspecified   [place], [place], [place]   1 year ago Type 2 diabetes mellitus without complication, without long-term current use of insulin ([place])   [place], Enobong, MD   1 year ago Chronic atrial fibrillation [facility])   [place], Charlane Ferretti, MD   1 year ago Type 2 diabetes mellitus without complication, without long-term current use of insulin [facility])   [place] [name], MD      Future Appointments            In 2 weeks [name], MD [place]   In 4 weeks Troy Sine, MD [facility] [place], [facility]   In 1 month Croitoru, [place], MD [place], [place]           . dapagliflozin propanediol (FARXIGA) 10 MG TABS tablet 30 tablet 1    Sig: Take 1 tablet (10 mg total) by mouth daily.     Endocrinology:  Diabetes - SGLT2 Inhibitors Failed - 05/27/2021  6:24 PM      Failed - AA eGFR in normal range and within 360 days    GFR, Est African American  Date Value Ref Range Status  03/22/2015 >89 mL/min Final   GFR calc Af Amer  Date Value Ref Range Status  04/10/2020 >60 >60 mL/min Final   GFR, Est Non African American  Date Value Ref Range Status  03/22/2015 78 mL/min Final    Comment:      The estimated GFR is a calculation valid for adults (>=54 years old) that uses the CKD-EPI algorithm to adjust for age and sex. It is   not to be used for children, pregnant women, hospitalized patients,    patients on dialysis, or with rapidly changing kidney function. According to the NKDEP, eGFR >89 is normal, 60-89 shows mild impairment, 30-59 shows moderate impairment, 15-29 shows severe impairment and <15 is ESRD.      GFR, Estimated  Date Value Ref Range Status  04/09/2021 >60 >60 mL/min Final    Comment:    (NOTE) Calculated using the CKD-EPI Creatinine Equation (2021)          Failed - Valid encounter within last 6 months    Recent Outpatient Visits          11 months ago Annual physical exam   [place], Charlane Ferretti, MD   1 year ago Urinary  tract infection without hematuria, site unspecified   [place], [place], [place]   1 year ago Type 2 diabetes mellitus without complication, without long-term current use of insulin ([place])   [place], Enobong, MD   1 year ago Chronic atrial fibrillation [facility])   [place], Charlane Ferretti, MD   1 year ago Type 2 diabetes mellitus without complication, without long-term current use of insulin ([place])   [place] [name], MD      Future Appointments            In 2 weeks [name], MD [place]   In 4 weeks Troy Sine, MD [facility] [place], [facility]   In 1 month Croitoru, [place], MD [facility], [place] in normal range and within 360 days    Creat  Date Value Ref Range Status  03/07/2016 0.88 0.50 - 1.05 mg/dL Final    Comment:      For patients > or = 57 years of age: The upper reference limit for Creatinine is approximately 13% higher for people identified as African-American.      Creatinine, Ser  Date Value Ref Range Status  04/09/2021 0.92 0.44 - 1.00 mg/dL Final         Passed - LDL in normal range and within 360 days    LDL Chol Calc (NIH)  Date Value Ref Range Status  10/21/2019 49 0 - 99 mg/dL Final   LDL Cholesterol  Date Value Ref Range Status  03/29/2021 79 0 - 99 mg/dL Final    Comment:           Total Cholesterol/HDL:CHD Risk Coronary Heart Disease Risk Table                     Men   Women  1/2 Average Risk   3.4   3.3  Average Risk       5.0   4.4  2 X Average Risk   9.6   7.1  3 X Average Risk  23.4   11.0        Use the calculated Patient Ratio above and the CHD Risk Table to determine the patient's CHD Risk.  ATP III CLASSIFICATION (LDL):  <100     mg/dL   Optimal  100-129  mg/dL   Near or Above                     Optimal  130-159  mg/dL   Borderline  160-189  mg/dL   High  >190     mg/dL   Very High Performed at [facility] [address], [place], [place] [address]          Passed - HBA1C is between 0 and 7.9 and within 180 days    HbA1c, POC (controlled diabetic range)  Date Value Ref Range Status  01/18/2020 5.9 0.0 - 7.0 % Final   Hgb A1c MFr Bld  Date Value Ref Range Status  03/28/2021 6.7 (H) 4.8 - 5.6 % Final    Comment:    (NOTE) Pre diabetes:          5.7%-6.4%  Diabetes:              >6.4%  Glycemic control for   <7.0% adults with diabetes          . gabapentin (NEURONTIN) 300 MG capsule 60 capsule 0    Sig: TAKE 1 CAPSULE (300 MG TOTAL) BY MOUTH 2 (TWO) TIMES DAILY.     Neurology: Anticonvulsants - gabapentin Passed - 05/27/2021  6:24 PM      Passed - Valid encounter within last 12 months    Recent Outpatient Visits          11 months ago Annual physical exam   [place] [name], MD   1 year ago Urinary tract infection without hematuria, site unspecified   [place], [place], [place]   1 year ago Type 2 diabetes mellitus without complication, without long-term current use of insulin ([place])   [place], Enobong, MD   1 year ago Chronic atrial fibrillation [facility])   [place], Charlane Ferretti, MD   1 year ago Type 2 diabetes mellitus without complication, without long-term current use of insulin ([place])   [place], Charlane Ferretti, MD      Future Appointments            In 2 weeks [name], MD [place]   In 4 weeks Troy Sine, MD [facility] [place], [facility]   In 1 month Croitoru, [place], MD [facility], [facility]           . metFORMIN (GLUCOPHAGE) 500 MG tablet 60 tablet 6    Sig: Take 1 tablet (500 mg total) by mouth  2 (two) times daily with a meal.     Endocrinology:  Diabetes - Biguanides Failed - 05/27/2021  6:24 PM      Failed - AA eGFR in normal range and within 360 days    GFR, Est African American  Date Value Ref Range Status  03/22/2015 >89 mL/min Final   GFR calc Af Amer  Date Value Ref Range Status  04/10/2020 >60 >60 mL/min Final   GFR, Est Non African American  Date Value Ref Range Status  03/22/2015 78 mL/min Final    Comment:      The estimated GFR is a calculation valid for adults (>=18 years old) that uses the CKD-EPI algorithm to adjust for age and sex. It is  not to be used for children, pregnant women, hospitalized patients,    patients on dialysis, or with rapidly changing kidney function. According to the NKDEP, eGFR >89 is normal, 60-89 shows mild impairment, 30-59 shows moderate impairment, 15-29 shows severe impairment and <15 is ESRD.      GFR, Estimated  Date Value Ref Range Status  04/09/2021 >60 >60 mL/min Final    Comment:    (NOTE) Calculated using the CKD-EPI Creatinine Equation (2021)          Failed - Valid encounter within last 6 months    Recent Outpatient Visits          11 months ago Annual physical exam   [place], Charlane Ferretti, MD   1 year ago Urinary tract infection without hematuria, site unspecified   [place], [place], [place]   1 year ago Type 2 diabetes mellitus without complication, without long-term current use of insulin ([place])   [place], Enobong, MD   1 year ago Chronic atrial fibrillation [facility])   [place], Charlane Ferretti, MD   1 year ago Type 2 diabetes mellitus without complication, without long-term current use of insulin ([place])   [place] [name], MD      Future Appointments            In 2 weeks [name], MD [place]   In 4 weeks Troy Sine, MD [facility] [place], [facility]   In 1 month Croitoru, [place], MD [facility], [place] in normal range and within 360 days    Creat  Date Value Ref Range Status  03/07/2016 0.88 0.50 - 1.05 mg/dL Final    Comment:      For patients > or = 57 years of age: The upper reference limit for Creatinine is approximately 13% higher for people identified as African-American.      Creatinine, Ser  Date Value Ref Range Status  04/09/2021 0.92 0.44 - 1.00 mg/dL Final         Passed - HBA1C is between 0 and 7.9 and within 180 days    HbA1c, POC (controlled diabetic range)  Date Value Ref Range Status  01/18/2020 5.9 0.0 - 7.0 % Final   Hgb A1c MFr Bld  Date Value Ref Range Status  03/28/2021 6.7 (H) 4.8 - 5.6 % Final    Comment:    (NOTE) Pre diabetes:          5.7%-6.4%  Diabetes:              >6.4%  Glycemic control for   <7.0% adults with diabetes

## 2021-05-27 NOTE — Telephone Encounter (Signed)
Requested medications are due for refill today.  Unknown  Requested medications are on the active medications list.  One yes, one no  Last refill. Wilder Glade 04/04/2021, Metformin 01/18/2020  Future visit scheduled.   Yes  Notes to clinic.  Wilder Glade was prescribed by Dr. Cruzita Lederer and Metformin was d/c'd 03/28/2021

## 2021-05-28 ENCOUNTER — Other Ambulatory Visit: Payer: Self-pay

## 2021-05-29 ENCOUNTER — Other Ambulatory Visit: Payer: Self-pay

## 2021-05-29 MED ORDER — DAPAGLIFLOZIN PROPANEDIOL 10 MG PO TABS: 10.0000 mg | ORAL_TABLET | Freq: Every day | ORAL | 0 refills | Status: DC

## 2021-05-30 ENCOUNTER — Other Ambulatory Visit: Payer: Self-pay

## 2021-05-30 ENCOUNTER — Other Ambulatory Visit: Payer: Self-pay | Admitting: Obstetrics and Gynecology

## 2021-05-30 NOTE — Patient Outreach (Signed)
Care Coordination  05/30/2021  [name] 12-Sep-1966 [id number]   Medicaid Managed Care   Unsuccessful Outreach Note  05/30/2021 Name: [name] MRN: [id number] DOB: 06/08/1966  Referred by: [name], MD Reason for referral : High Risk Managed Medicaid (Unsuccessful telephone outreach)   A second unsuccessful telephone outreach was attempted today. The patient was referred to the case management team for assistance with care management and care coordination.   Follow Up Plan: The care management team will reach out to the patient again over the next 7 days.   Aida Raider RN, BSN [place]  [facility] - Managed Medicaid High Risk [phone].

## 2021-05-30 NOTE — Patient Instructions (Signed)
Hi Ms. Square, sorry I missed you today - as a part of your Medicaid benefit, you are eligible for care management and care coordination services at no cost or copay. I was unable to reach you by phone today but would be happy to help you with your health related needs. Please feel free to call me at [phone].  A member of the Managed Medicaid care management team will reach out to you again over the next 7 days.   Aida Raider RN, BSN [place]  [facility] - Managed Medicaid High Risk [phone].

## 2021-05-31 ENCOUNTER — Telehealth: Payer: Self-pay | Admitting: Family Medicine

## 2021-05-31 NOTE — Telephone Encounter (Signed)
..   Medicaid Managed Care   Unsuccessful Outreach Note  05/31/2021 Name: [name] MRN: [id number] DOB: 08-02-1966  Referred by: [name], MD Reason for referral : High Risk Managed Medicaid (I called this patient today to get her phone visit with the [facility] RNCM rescheduled. I left my name and number on her VM.)   An unsuccessful telephone outreach was attempted today. The patient was referred to the case management team for assistance with care management and care coordination.   Follow Up Plan: The care management team will reach out to the patient again over the next 7 days.   [place]

## 2021-06-04 ENCOUNTER — Other Ambulatory Visit: Payer: Self-pay

## 2021-06-05 ENCOUNTER — Other Ambulatory Visit: Payer: Self-pay

## 2021-06-06 ENCOUNTER — Telehealth: Payer: Self-pay | Admitting: Family Medicine

## 2021-06-06 NOTE — Telephone Encounter (Signed)
..   Medicaid Managed Care   Unsuccessful Outreach Note  06/06/2021 Name: [name] MRN: [id number] DOB: [birth year]  Referred by: [name], MD Reason for referral : High Risk Managed Medicaid (I called the patient today to get her phone visit with the [facility] RNCM rescheduled. I left a message on her VM.)   A second unsuccessful telephone outreach was attempted today. The patient was referred to the case management team for assistance with care management and care coordination.   Follow Up Plan: The care management team will reach out to the patient again over the next 7 days.   [place]

## 2021-06-11 ENCOUNTER — Ambulatory Visit: Payer: Medicaid Other | Admitting: Family Medicine

## 2021-06-13 ENCOUNTER — Other Ambulatory Visit: Payer: Self-pay

## 2021-06-14 ENCOUNTER — Other Ambulatory Visit: Payer: Self-pay

## 2021-06-14 ENCOUNTER — Telehealth (INDEPENDENT_AMBULATORY_CARE_PROVIDER_SITE_OTHER): Payer: Medicaid Other | Admitting: Nurse Practitioner

## 2021-06-14 ENCOUNTER — Other Ambulatory Visit: Payer: Self-pay | Admitting: Family Medicine

## 2021-06-14 ENCOUNTER — Telehealth: Payer: Self-pay | Admitting: Family Medicine

## 2021-06-14 DIAGNOSIS — E119 Type 2 diabetes mellitus without complications: Secondary | ICD-10-CM | POA: Diagnosis not present

## 2021-06-14 MED ORDER — METHOCARBAMOL 500 MG PO TABS
500.0000 mg | ORAL_TABLET | Freq: Three times a day (TID) | ORAL | 0 refills | Status: AC | PRN
  Filled 2021-06-14: qty 90, 30d supply, fill #0

## 2021-06-14 MED ORDER — ACCU-CHEK GUIDE VI STRP
ORAL_STRIP | 0 refills | Status: DC
  Filled 2021-06-14: qty 100, 90d supply, fill #0

## 2021-06-14 MED ORDER — ACCU-CHEK FASTCLIX LANCETS MISC
0 refills | Status: DC
Start: 2021-06-14 — End: 2022-08-14
  Filled 2021-06-14: qty 102, 90d supply, fill #0

## 2021-06-14 MED ORDER — METFORMIN HCL 500 MG PO TABS
500.0000 mg | ORAL_TABLET | Freq: Two times a day (BID) | ORAL | 6 refills | Status: DC
  Filled 2021-06-14: qty 60, 30d supply, fill #0

## 2021-06-14 NOTE — Telephone Encounter (Signed)
Requested medication (s) are due for refill today - expired Rx  Requested medication (s) are on the active medication list -no  Future visit scheduled -no  Last refill: 1 year  Notes to clinic: Request RF: non delegated Rx, no longer on current medication list  Requested Prescriptions  Pending Prescriptions Disp Refills   butalbital-acetaminophen-caffeine (FIORICET) 50-325-40 MG tablet 40 tablet 1    Sig: TAKE 1 TABLET BY MOUTH EVERY 12 HOURS AS NEEDED.     Not Delegated - Analgesics:  Non-Opioid Analgesic Combinations Failed - 06/14/2021  9:35 AM      Failed - This refill cannot be delegated      Passed - Valid encounter within last 12 months    Recent Outpatient Visits           11 months ago Annual physical exam   [place], Charlane Ferretti, MD   1 year ago Urinary tract infection without hematuria, site unspecified   [place], [place], [place]   1 year ago Type 2 diabetes mellitus without complication, without long-term current use of insulin ([place])   [place], Enobong, MD   1 year ago Chronic atrial fibrillation [facility])   [place], Charlane Ferretti, MD   1 year ago Type 2 diabetes mellitus without complication, without long-term current use of insulin ([place])   [place], Enobong, MD       Future Appointments             In 1 week Troy Sine, MD [facility] [place], [facility]   In 1 month Croitoru, [place], MD [facility] [place], [facility]               Requested Prescriptions  Pending Prescriptions Disp Refills   butalbital-acetaminophen-caffeine (FIORICET) 50-325-40 MG tablet 40 tablet 1    Sig: TAKE 1 TABLET BY MOUTH EVERY 12 HOURS AS NEEDED.     Not Delegated - Analgesics:  Non-Opioid Analgesic Combinations Failed - 06/14/2021  9:35 AM      Failed - This refill cannot be  delegated      Passed - Valid encounter within last 12 months    Recent Outpatient Visits           11 months ago Annual physical exam   [place], Charlane Ferretti, MD   1 year ago Urinary tract infection without hematuria, site unspecified   [place], [place], [place]   1 year ago Type 2 diabetes mellitus without complication, without long-term current use of insulin ([place])   [place], Enobong, MD   1 year ago Chronic atrial fibrillation [facility])   [place], Charlane Ferretti, MD   1 year ago Type 2 diabetes mellitus without complication, without long-term current use of insulin [facility])   [place], MD       Future Appointments             In 1 week Troy Sine, MD [facility] [place], [facility]   In 1 month Croitoru, [place], MD [place], [place]

## 2021-06-14 NOTE — Progress Notes (Signed)
Virtual Visit via Video Note  I connected with [name] on 06/17/21 at  8:50 AM EDT by a video enabled telemedicine application and verified that I am speaking with the correct person using two identifiers.  Location: Patient: home Provider: office   I discussed the limitations of evaluation and management by telemedicine and the availability of in person appointments. The patient expressed understanding and agreed to proceed.  History of Present Illness:  Patient presents today for medication refills. This is a patient of Dr. Margarita Rana.  Patient states that she needs refills on Glucophage and Robaxin.  She is also requesting refills on diabetic testing supplies.  Overall patient states that she has been doing well.  We will get her set up for follow-up appointment with her PCP.  She did recently have her hemoglobin A1c checked at a recent hospital visit in July 2022.  Her hemoglobin A1c at that time was 6.7.  Patient states that she has no new issues or concerns today. Denies f/c/s, n/v/d, hemoptysis, PND, chest pain or edema.     Observations/Objective:  Vitals with BMI 04/09/2021 04/04/2021 04/04/2021  Height - - -  Weight 192 lbs - -  BMI 04.88 - -  Systolic 891 89 85  Diastolic 90 77 61  Pulse 91 84 -      Assessment and Plan:  Diabetes:  Lab Results  Component Value Date   HGBA1C 6.7 (H) 03/28/2021   Will reorder Glucophage and testing supplies  Stay active  Diabetic diet  Chronic arm pain:  Will reorder robaxin  Follow up:  Follow up with PCP within the next 3 months    I discussed the assessment and treatment plan with the patient. The patient was provided an opportunity to ask questions and all were answered. The patient agreed with the plan and demonstrated an understanding of the instructions.   The patient was advised to call back or seek an in-person evaluation if the symptoms worsen or if the condition fails to improve as anticipated.  I provided  23 minutes of non-face-to-face time during this encounter.   Fenton Foy, NP

## 2021-06-14 NOTE — Telephone Encounter (Signed)
Called patient to schedule follow up with Dr. Margarita Rana, per Lazaro Arms. Unable to reach patient. LVM for patient to call the office to schedule an appointment.

## 2021-06-17 ENCOUNTER — Telehealth: Payer: Self-pay | Admitting: Family Medicine

## 2021-06-17 ENCOUNTER — Encounter: Payer: Self-pay | Admitting: Nurse Practitioner

## 2021-06-17 DIAGNOSIS — E119 Type 2 diabetes mellitus without complications: Secondary | ICD-10-CM | POA: Insufficient documentation

## 2021-06-17 MED ORDER — BUTALBITAL-APAP-CAFFEINE 50-325-40 MG PO TABS
1.0000 | ORAL_TABLET | Freq: Two times a day (BID) | ORAL | 0 refills | Status: AC | PRN
  Filled 2021-06-17: qty 40, 20d supply, fill #0

## 2021-06-17 NOTE — Telephone Encounter (Signed)
..   Medicaid Managed Care   Unsuccessful Outreach Note  06/17/2021 Name: [name] MRN: [id number] DOB: 31-Dec-1965  Referred by: [name], MD Reason for referral : High Risk Managed Medicaid (I called the patient today to get her phone visit with the [facility] RNCM rescheduled. I left my name and number on her VM.)   Third unsuccessful telephone outreach was attempted today. The patient was referred to the case management team for assistance with care management and care coordination. The patient's primary care provider has been notified of our unsuccessful attempts to make or maintain contact with the patient. The care management team is pleased to engage with this patient at any time in the future should he/she be interested in assistance from the care management team.   Follow Up Plan: We have been unable to make contact with the patient for follow up. The care management team is available to follow up with the patient after provider conversation with the patient regarding recommendation for care management engagement and subsequent re-referral to the care management team.   [place], [place]

## 2021-06-17 NOTE — Patient Instructions (Signed)
Diabetes:  Lab Results  Component Value Date   HGBA1C 6.7 (H) 03/28/2021   Will reorder Glucophage and testing supplies  Stay active  Diabetic diet  Chronic arm pain:  Will reorder robaxin  Follow up:  Follow up with PCP within the next 3 months

## 2021-06-17 NOTE — Assessment & Plan Note (Signed)
Diabetes:  Lab Results  Component Value Date   HGBA1C 6.7 (H) 03/28/2021   Will reorder Glucophage and testing supplies  Stay active  Diabetic diet  Chronic arm pain:  Will reorder robaxin  Follow up:  Follow up with PCP within the next 3 months

## 2021-06-18 ENCOUNTER — Other Ambulatory Visit: Payer: Self-pay

## 2021-06-19 ENCOUNTER — Other Ambulatory Visit: Payer: Self-pay

## 2021-06-24 ENCOUNTER — Other Ambulatory Visit: Payer: Self-pay

## 2021-06-24 ENCOUNTER — Ambulatory Visit (INDEPENDENT_AMBULATORY_CARE_PROVIDER_SITE_OTHER): Payer: Medicaid Other | Admitting: Cardiovascular Disease

## 2021-06-24 ENCOUNTER — Encounter: Payer: Self-pay | Admitting: Cardiovascular Disease

## 2021-06-24 VITALS — BP 145/76 | HR 98 | Ht 64.0 in | Wt 205.2 lb

## 2021-06-24 DIAGNOSIS — G4733 Obstructive sleep apnea (adult) (pediatric): Secondary | ICD-10-CM | POA: Diagnosis not present

## 2021-06-24 DIAGNOSIS — I4811 Longstanding persistent atrial fibrillation: Secondary | ICD-10-CM

## 2021-06-24 DIAGNOSIS — Z7901 Long term (current) use of anticoagulants: Secondary | ICD-10-CM

## 2021-06-24 DIAGNOSIS — I5042 Chronic combined systolic (congestive) and diastolic (congestive) heart failure: Secondary | ICD-10-CM

## 2021-06-24 DIAGNOSIS — E119 Type 2 diabetes mellitus without complications: Secondary | ICD-10-CM

## 2021-06-24 DIAGNOSIS — E785 Hyperlipidemia, unspecified: Secondary | ICD-10-CM | POA: Diagnosis not present

## 2021-06-24 DIAGNOSIS — Z8673 Personal history of transient ischemic attack (TIA), and cerebral infarction without residual deficits: Secondary | ICD-10-CM

## 2021-06-24 NOTE — Progress Notes (Signed)
Cardiology Office Note    Date:  07/02/2021   ID:  Elizabeth, Murphy 1966/03/05, MRN [id number]  PCP:  [name], MD  Cardiologist:  Shelva Majestic, MD (sleep); Dr. Sallyanne Kuster  New sleep evaluation   History of Present Illness:  [name] is a 55 y.o. female who is followed by Dr. Sallyanne Kuster for cardiology care and Dr. Margarita Rana for primary care.  [name] has a history of longstanding persistent hypertension, 2 previous episodes of stroke without residual deficits and has a history of hypertension.  She has had issues with AF with RVR and had early recurrence of A. fib following prior cardioversion.  She was in the hospital in July 2022 with shortness of breath, orthopnea and lower extremity edema.  An echo Doppler study showed an EF reduced to 25 to 30% (down from 60 to 65% in August 2020) with mild LVH and mildly reduced RV systolic function.  She had undergone right and left heart cardiac catheterization  and had been found to have mild pulmonary hypertension and 60% stenosis in her LAD with 70% stenosis in her circumflex vessel.  [name] has a history of obstructive sleep apnea and had undergone a sleep study in November 2019.  At that time she met split-night criteria and was found to have severe obstructive sleep apnea with a total AHI of 72.9, rem AHI 78.3/h, supine AHI at 1 to 1.8/h and had significant oxygen desaturation to 73%.  She underwent initial CPAP titration but required BiPAP and ultimately was titrated up to 18/14 centimeters of water.  It was recommended that therapy be initiated at 17/13.  Apparently, patient states she had only used BiPAP for 1 month and she was never seen by a sleep physician following her study.  In 2020, she apparently was evaluated by Asencion Noble.  She has an ResMed AirCurve 10 BiPAP auto unit with set up in 2020 by [employer].  No data on machine . With her recent hospitalizations, heart failure, and her longstanding persistent atrial fibrillation, it  was recommended that she see a sleep physician and she presents to the office today for evaluation.  Presently, she does not sleep well.  She admits to snoring.  Her sleep is nonrestorative.  She admits to fatigability and daytime sleepiness.  An Epworth Sleepiness Scale was calculated in the office today and this endorsed at 12 as shown below consistent with excessive daytime sleepiness.  Epworth Sleepiness Scale: Situation   Chance of Dozing/Sleeping (0 = never , 1 = slight chance , 2 = moderate chance , 3 = high chance )   sitting and reading 3   watching TV 3   sitting inactive in a public place 3   being a passenger in a motor vehicle for an hour or more 0   lying down in the afternoon 0   sitting and talking to someone 0   sitting quietly after lunch (no alcohol) 3   while stopped for a few minutes in traffic as the driver 0   Total Score  12    Presently, she denies any awareness of restless legs, bruxism, sleep paralysis, or cataplectic events.   Past Medical History:  Diagnosis Date   Clotting disorder ([place])    on xarelto   Essential hypertension    Heart murmur    a. 10/2015 Echo: EF 60-65%, no rwma, mild AI/MR, sev dil LA/RA, PASP 41mmHg.   Noncompliance    NSVT (nonsustained ventricular tachycardia)  a. 10/2015 during admission for CVA/AF.   Persistent atrial fibrillation ([place])    a. CHA2DS2VASC = 4-->Xarelto;  b. 02/2016 Successful DCCV.  c. Recurrent fib   Pneumonia 2012 x 2; 2015   Stroke [facility])    a. 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   Transient ischemic attack (TIA) 01/2013    Past Surgical History:  Procedure Laterality Date   CARDIOVERSION N/A 02/18/2013   Procedure: CARDIOVERSION;  Surgeon: Sanda Klein, MD;  Location: [place] [facility];  Service: Cardiovascular;  Laterality: N/A;   CARDIOVERSION N/A 02/23/2013   Procedure: CARDIOVERSION;  Surgeon: Leonie Man, MD;   Location: [place];  Service: Cardiovascular;  Laterality: N/A;  [place] CV   CARDIOVERSION N/A 03/12/2016   Procedure: CARDIOVERSION;  Surgeon: Larey Dresser, MD;  Location: [place];  Service: Cardiovascular;  Laterality: N/A;   RIGHT/LEFT HEART CATH AND CORONARY ANGIOGRAPHY N/A 04/02/2021   Procedure: RIGHT/LEFT HEART CATH AND CORONARY ANGIOGRAPHY;  Surgeon: Belva Crome, MD;  Location: [place] CV LAB;  Service: Cardiovascular;  Laterality: N/A;   TEE WITHOUT CARDIOVERSION N/A 02/18/2013   Procedure: TRANSESOPHAGEAL ECHOCARDIOGRAM (TEE);  Surgeon: Sanda Klein, MD;  Location: [facility] ENDOSCOPY;  Service: Cardiovascular;  Laterality: N/A;   TUBAL LIGATION  1992    Current Medications: Outpatient Medications Prior to Visit  Medication Sig Dispense Refill   Accu-Chek FastClix Lancets MISC USE AS INSTRUCTED TO CHECK BLOOD SUGAR ONCE DAILY. 102 each 0   acetaminophen (TYLENOL) 500 MG tablet Take 1,000 mg by mouth every 6 (six) hours as needed for headache.      atorvastatin (LIPITOR) 80 MG tablet Take 1 tablet (80 mg total) by mouth daily at 6 PM. 30 tablet 2   Blood Glucose Monitoring Suppl (ACCU-CHEK GUIDE ME) w/Device KIT 1 kit by Does not apply route daily. 1 kit 0   butalbital-acetaminophen-caffeine (FIORICET) 50-325-40 MG tablet TAKE 1 TABLET BY MOUTH EVERY 12 HOURS AS NEEDED. 40 tablet 0   dapagliflozin propanediol (FARXIGA) 10 MG TABS tablet Take 1 tablet (10 mg total) by mouth daily. 30 tablet 0   digoxin (LANOXIN) 0.25 MG tablet Take 1 tablet (0.25 mg total) by mouth daily. 30 tablet 2   furosemide (LASIX) 40 MG tablet Take 1 tablet (40 mg total) by mouth daily. 30 tablet 2   gabapentin (NEURONTIN) 300 MG capsule TAKE 1 CAPSULE (300 MG TOTAL) BY MOUTH 2 (TWO) TIMES DAILY. 90 capsule 0   glucose blood (ACCU-CHEK GUIDE) test strip Use as instructed 100 strip 0   losartan (COZAAR) 25 MG tablet Take 0.5 tablets (12.5 mg total) by mouth daily. 15 tablet 2   metFORMIN (GLUCOPHAGE) 500 MG  tablet Take 1 tablet (500 mg total) by mouth 2 (two) times daily with a meal. 60 tablet 6   methocarbamol (ROBAXIN) 500 MG tablet Take 1 tablet (500 mg total) by mouth every 8 (eight) hours as needed for muscle spasms. 90 tablet 0   metoprolol succinate (TOPROL-XL) 100 MG 24 hr tablet Take 1 tablet (100 mg total) by mouth daily. Take with or immediately following a meal. 30 tablet 2   metoprolol succinate (TOPROL-XL) 50 MG 24 hr tablet Take 1 tablet (50 mg total) by mouth daily. Take with or immediately following a meal. 30 tablet 2   Misc. Devices MISC Blood pressure monitor.  Diagnosis Hypertension 1 each 0   rivaroxaban (XARELTO) 20 MG TABS tablet TAKE 1 TABLET (20 MG TOTAL) BY  MOUTH DAILY WITH SUPPER. 30 tablet 2   spironolactone (ALDACTONE) 25 MG tablet Take 0.5 tablets (12.5 mg total) by mouth daily. 15 tablet 2   No facility-administered medications prior to visit.     Allergies:   Penicillins   Social History   Socioeconomic History   Marital status: Single    Spouse name: Not on file   Number of children: Not on file   Years of education: Not on file   Highest education level: Not on file  Occupational History   Occupation: Training and development officer    Employer: [employer]   Occupation: in home health aide  Tobacco Use   Smoking status: Former    Years: 3.00    Types: Cigarettes    Quit date: 03/15/2013    Years since quitting: 8.3   Smokeless tobacco: Never  Vaping Use   Vaping Use: Never used  Substance and Sexual Activity   Alcohol use: Yes    Comment: drinks on the weekends,   2-3 beers   Drug use: No   Sexual activity: Not Currently    Birth control/protection: None  Other Topics Concern   Not on file  Social History Narrative   Not on file   Social Determinants of Health   Financial Resource Strain: Not on file  Food Insecurity: Not on file  Transportation Needs: Not on file  Physical Activity: Not on file  Stress: Not on file  Social Connections: Not on file      Family History:  The patient's family history includes Atrial fibrillation in her father and mother; Breast cancer in her mother; Cancer in her mother; Diabetes in an other family member; Hypertension in her father and another family member; Peripheral vascular disease in her father.   ROS General: Negative; No fevers, chills, or night sweats;  HEENT: Negative; No changes in vision or hearing, sinus congestion, difficulty swallowing Pulmonary: Negative; No cough, wheezing, shortness of breath, hemoptysis Cardiovascular: Positive for CAD, cardiomyopathy, longstanding persistent atrial fibrillation, hypertension GI: Negative; No nausea, vomiting, diarrhea, or abdominal pain GU: Negative; No dysuria, hematuria, or difficulty voiding Musculoskeletal: Negative; no myalgias, joint pain, or weakness Hematologic/Oncology: Negative; no easy bruising, bleeding Endocrine: Negative; no heat/cold intolerance; no diabetes Neuro: History of stroke x2 Skin: Negative; No rashes or skin lesions Psychiatric: Negative; No behavioral problems, depression Sleep: Positive for OSA, snoring, daytime sleepiness, hypersomnolence, no bruxism, restless legs, hypnogognic hallucinations, no cataplexy Other comprehensive 14 point system review is negative.   PHYSICAL EXAM:   VS:  BP (!) 145/76   Pulse 98   Ht $R'5\' 4"'OE$  (1.626 m)   Wt 205 lb 3.2 oz (93.1 kg)   SpO2 98%   BMI 35.22 kg/m     Repeat blood pressure by me  Wt Readings from Last 3 Encounters:  06/24/21 205 lb 3.2 oz (93.1 kg)  04/09/21 192 lb (87.1 kg)  04/04/21 192 lb 0.3 oz (87.1 kg)    General: Alert, oriented, no distress.  Skin: normal turgor, no rashes, warm and dry HEENT: Normocephalic, atraumatic. Pupils equal round and reactive to light; sclera anicteric; extraocular muscles intact;  Nose without nasal septal hypertrophy Mouth/Parynx benign; Mallinpatti scale 3 Neck: No JVD, no carotid bruits; normal carotid upstroke Lungs: clear to  ausculatation and percussion; no wheezing or rales Chest wall: without tenderness to palpitation Heart: PMI not displaced, irregularly irregular with a ventricular rate in the upper 90s, s1 s2 normal, 1/6 systolic murmur, no diastolic murmur, no rubs, gallops, thrills, or  heaves Abdomen: soft, nontender; no hepatosplenomehaly, BS+; abdominal aorta nontender and not dilated by palpation. Back: no CVA tenderness Pulses 2+ Musculoskeletal: full range of motion, normal strength, no joint deformities Extremities: no clubbing cyanosis or edema, Homan's sign negative  Neurologic: grossly nonfocal; Cranial nerves grossly wnl Psychologic: Normal mood and affect   Studies/Labs Reviewed:   EKG:  EKG is ordered today. ECG (independently read by me): Atrial fibrillation at 94 bpm.  Nonspecific ST-T abnormalities.  QTc interval 437 ms.  Recent Labs: BMP Latest Ref Rng & Units 04/09/2021 04/04/2021 04/03/2021  Glucose 70 - 99 mg/dL 102(H) 110(H) 100(H)  BUN 6 - 20 mg/dL $Remove'10 10 8  'rgMVSRm$ Creatinine 0.44 - 1.00 mg/dL 0.92 0.89 0.89  BUN/Creat Ratio 9 - 23 - - -  Sodium 135 - 145 mmol/L 138 135 138  Potassium 3.5 - 5.1 mmol/L 4.2 3.9 4.1  Chloride 98 - 111 mmol/L 105 104 106  CO2 22 - 32 mmol/L 25 21(L) 25  Calcium 8.9 - 10.3 mg/dL 9.3 9.1 9.0     Hepatic Function Latest Ref Rng & Units 10/21/2019 04/21/2019 09/29/2018  Total Protein 6.0 - 8.5 g/dL 7.7 7.5 7.4  Albumin 3.8 - 4.9 g/dL 4.0 3.5 4.0  AST 0 - 40 IU/L $Remov'17 24 20  'iCuDjq$ ALT 0 - 32 IU/L $Remov'12 20 18  'DYyDMK$ Alk Phosphatase 39 - 117 IU/L 101 79 104  Total Bilirubin 0.0 - 1.2 mg/dL 0.5 0.5 0.4    CBC Latest Ref Rng & Units 04/03/2021 04/02/2021 04/02/2021  WBC 4.0 - 10.5 K/uL 8.3 - -  Hemoglobin 12.0 - 15.0 g/dL 13.2 13.6 13.3  Hematocrit 36.0 - 46.0 % 42.5 40.0 39.0  Platelets 150 - 400 K/uL 279 - -   Lab Results  Component Value Date   MCV 81.3 04/03/2021   MCV 82.2 04/02/2021   MCV 81.8 04/01/2021   Lab Results  Component Value Date   TSH 2.122 03/28/2021    Lab Results  Component Value Date   HGBA1C 6.7 (H) 03/28/2021     BNP    Component Value Date/Time   BNP 269.5 (H) 04/09/2021 1214    ProBNP    Component Value Date/Time   PROBNP 2,538.0 (H) 04/12/2014 0355     Lipid Panel     Component Value Date/Time   CHOL 111 03/29/2021 0036   CHOL 101 10/21/2019 1458   TRIG 71 03/29/2021 0036   HDL 18 (L) 03/29/2021 0036   HDL 36 (L) 10/21/2019 1458   CHOLHDL 6.2 03/29/2021 0036   VLDL 14 03/29/2021 0036   LDLCALC 79 03/29/2021 0036   LDLCALC 49 10/21/2019 1458   LABVLDL 16 10/21/2019 1458     RADIOLOGY: No results found.   Additional studies/ records that were reviewed today include:  I reviewed the patient's records from [facility] community health and wellness, Dr. Sallyanne Kuster, and heart failure encounter. Her sleep study from August 04, 2018 was reviewed.   August 04, 2018 RESPIRATORY PARAMETERS Diagnostic Total AHI (/hr):            72.9     RDI (/hr):         76.7     OA Index (/hr):            11        CA Index (/hr):      2.4 REM AHI (/hr):            78.3     NREM AHI (/hr):  71.7     Supine AHI (/hr):         101.8   Non-supine AHI (/hr):        72.33 Min O2 Sat (%):          73.0     Mean O2 (%):  90.6     Time below 88% (min):           24.6        Titration Optimal IPAP Pressure (cm): 18        Optimal EPAP Pressure (cm):            14        AHI at Optimal Pressure (/hr):          0.0       Min O2 at Optimal Pressure (%):       95.0 Sleep % at Optimal (%):         89        Supine % at Optimal (%):       100         SLEEP ARCHITECTURE The study was initiated at 10:13:30 PM and terminated at 4:53:58 AM. The total recorded time was 400.5 minutes. EEG confirmed total sleep time was 229.5 minutes yielding a sleep efficiency of 57.3%%. Sleep onset after lights out was 15.3 minutes with a REM latency of 83.5 minutes. The patient spent 30.7%% of the night in stage N1 sleep, 49.7%% in stage N2 sleep,  0.0%% in stage N3 and 19.6% in REM. Wake after sleep onset (WASO) was 155.7 minutes. The Arousal Index was 35.8/hour.   LEG MOVEMENT DATA The total Periodic Limb Movements of Sleep (PLMS) were 0. The PLMS index was 0.0 .   CARDIAC DATA The 2 lead EKG demonstrated atrial fibrillation. The mean heart rate was 100.0 beats per minute. Other EKG findings include: PVCs.   IMPRESSIONS - Severe obstructive sleep apnea occurred during the diagnostic portion of the study (AHI  72.9 /h; RDI 76.7/h).  CPAP was initiated at 5 cm and was titrated to 15 cm. AHI at 15 cwp was 6.4 and RDI was 47.7.  BiPAP was initiated at 17/13 and was titrated to 18/14.  AHI at both BiPAP pressures was 0 and O2 nadir 94%. - No significant central sleep apnea occurred during the diagnostic portion of the study (CAI = 2.4/hour). - Significant  oxygen desaturation to a nadir of 73%. - The patient snored with moderate snoring volume during the diagnostic portion of the study. - EKG findings include PVCs. - Clinically significant periodic limb movements of sleep did not occur during the study.   DIAGNOSIS - Obstructive Sleep Apnea (327.23 [G47.33 ICD-10]) - Excessive Daytime Sleepiness   RECOMMENDATIONS - Recommend an initial trial of BiPAP therapy at 17/13 cm H2O with heated humidification.  A Medium Wide size Philips Respironics Full Face Mask Dreamwear mask was used for the titration. - Effort should be made to optimize nasal and pharyngeal patency. - Avoid alcohol, sedatives and other CNS depressants that may worsen sleep apnea and disrupt normal sleep architecture. - Sleep hygiene should be reviewed to assess factors that may improve sleep quality. - Weight management (BMI 35) and regular exercise should be initiated or continued. - Recommend a download be obtained in 30 days and sleep clinic evaluation.    ASSESSMENT:    1. Severe obstructive sleep apnea   2. Chronic combined systolic and diastolic CHF (congestive  heart failure) ([place])   3.  Longstanding persistent atrial fibrillation ([place])   4. Dyslipidemia (high LDL; low HDL)   5. Type 2 diabetes mellitus without complication, without long-term current use of insulin ([place])   6. History of stroke   7. Anticoagulated   8. Severe obesity (BMI 35.0-39.9) with comorbidity [facility])    PLAN:  Ms. Quida Glasser is a 55 year old female who has a history of longstanding persistent atrial fibrillation, who had undergone initial cardioversion in 2017 and most recently has been on rate control with digoxin 0.25 mg daily, and metoprolol succinate for which he takes 50 mg 3 times a day.  She is on anticoagulation with Xarelto and with her reduced EF at 25 to 30% is also on spironolactone 12.5 mg daily, losartan 12.5 mg daily, furosemide 40 mg, in addition to digoxin.  She had undergone a sleep study in 2019 and was found to have severe obstructive sleep apnea.  She ultimately required BiPAP titration and initial trial of BiPAP therapy at 17/13 was recommended.  Apparently, due to insurance issues she may never receive the machine at that time but ultimately in 2020 received a machine sent by [employer] with delivery on December 24, 2018.  Patient states that she did not use therapy for more than a month.  Ultimately, she has not been on treatment.  With her reduced LV function, atrial fibrillation, snoring, poor sleep, with frequent awakenings she is now referred to me for follow-up of sleep evaluation.  In 2020 she had seen Dr. Katherine Mantle at [facility] prior to receiving her machine.  I had a very lengthy discussion with her today (50 minutes) and reviewed with her normal sleep architecture and effect of sleep apnea causing disruptive sleep architecture.  I also reviewed potential cardiovascular adverse consequences is left untreated including hypertension, nocturnal palpitations or bradycardia, increased risk for atrial fibrillation, as well as significant nocturnal  hypoxemia contributing to both cardiovascular and cerebrovascular ischemia.  I discussed its implications on glucose, inflammation, as well as GERD.  I discussed with her that most often sleep apnea is worse during REM sleep and that the preponderance of REM sleep occurs in the second half of the night.  We discussed optimal sleep duration for an adult at 7 to 9 hours.  I discussed with her new mask options that have become available.  I will reach out to Barry Brunner, our sleep coordinator so that she may be given a free sample mask and I believe she will tolerate the ResMed AirFit F 30i mask.  We have called [employer] so that they can link her data to our office.  Presently, I am changing her to a BiPAP auto mode and we will set her minimum EPAP pressure at 11, pressure support of 5, IPAP minimum of 16 with a maximum up to 25 cm of water.  Her Epworth scale score calculated today is consistent with excessive daytime sleepiness.  We discussed the importance of meeting compliance standards and again ideal sleep duration being at least 7 8 hours if at all possible but to meet Medicare required mandate she must use therapy at least 70% of the night both for usage and usage greater than 4 hours.  Presently, her blood pressure is slightly increased and on repeat by me was 148/82.  This may improve with CPAP therapy but if blood pressure continues to be elevated titration of losartan may be necessary with possible transition to [facility] particularly with her reduced LV function.  She continues to be  on anticoagulation for her atrial fibrillation.  She is on atorvastatin for aggressive lipid management with target LDL less than 70.  LDL cholesterol in July 2022 was 79.  Hemoglobin A1c in July 2022 was 6.7.  She is diabetic on metformin.  I will see her in several months for follow-up evaluation or sooner as needed.   Time spent: 50 minutes  Medication Adjustments/Labs and Tests Ordered: Current medicines are reviewed  at length with the patient today.  Concerns regarding medicines are outlined above.  Medication changes, Labs and Tests ordered today are listed in the Patient Instructions below. Patient Instructions  .Medication Instructions:  Your physician recommends that you continue on your current medications as directed. Please refer to the Current Medication list given to you today.  *If you need a refill on your cardiac medications before your next appointment, please call your pharmacy*  Follow-Up: At [facility], you and your health needs are our priority.  As part of our continuing mission to provide you with exceptional heart care, we have created designated Provider Care Teams.  These Care Teams include your primary Cardiologist (physician) and Advanced Practice Providers (APPs -  Physician Assistants and Nurse Practitioners) who all work together to provide you with the care you need, when you need it.  We recommend signing up for the patient portal called "MyChart".  Sign up information is provided on this After Visit Summary.  MyChart is used to connect with patients for Virtual Visits (Telemedicine).  Patients are able to view lab/test results, encounter notes, upcoming appointments, etc.  Non-urgent messages can be sent to your provider as well.   To learn more about what you can do with MyChart, go to [email or web address].    Your next appointment:   6 week(s)  The format for your next appointment:   In Person  Provider:   Shelva Majestic, MD   Other Instructions We have made changes to your Bipap machine that will go into effect when you plug the machine in [place], our sleep coordinator will be in contact about a new mask   Signed, Shelva Majestic, MD  07/02/2021 4:21 PM    [facility] [address], [place], [place], [place]  [address] Phone: [phone]

## 2021-06-24 NOTE — Patient Instructions (Signed)
.  Medication Instructions:  Your physician recommends that you continue on your current medications as directed. Please refer to the Current Medication list given to you today.  *If you need a refill on your cardiac medications before your next appointment, please call your pharmacy*  Follow-Up: At [facility], you and your health needs are our priority.  As part of our continuing mission to provide you with exceptional heart care, we have created designated Provider Care Teams.  These Care Teams include your primary Cardiologist (physician) and Advanced Practice Providers (APPs -  Physician Assistants and Nurse Practitioners) who all work together to provide you with the care you need, when you need it.  We recommend signing up for the patient portal called "MyChart".  Sign up information is provided on this After Visit Summary.  MyChart is used to connect with patients for Virtual Visits (Telemedicine).  Patients are able to view lab/test results, encounter notes, upcoming appointments, etc.  Non-urgent messages can be sent to your provider as well.   To learn more about what you can do with MyChart, go to [email or web address].    Your next appointment:   6 week(s)  The format for your next appointment:   In Person  Provider:   Shelva Majestic, MD   Other Instructions We have made changes to your Bipap machine that will go into effect when you plug the machine in [place], our sleep coordinator will be in contact about a new mask

## 2021-07-02 ENCOUNTER — Other Ambulatory Visit: Payer: Self-pay | Admitting: Family Medicine

## 2021-07-02 ENCOUNTER — Encounter: Payer: Self-pay | Admitting: Cardiovascular Disease

## 2021-07-02 DIAGNOSIS — Z1231 Encounter for screening mammogram for malignant neoplasm of breast: Secondary | ICD-10-CM

## 2021-07-03 ENCOUNTER — Telehealth: Payer: Self-pay | Admitting: *Deleted

## 2021-07-03 NOTE — Telephone Encounter (Signed)
Contacted patient to have her to come in to ger F-30i mask sample that Dr Claiborne Billings wants her to try. She states that she will have to come by next week. She currently has no transportation.

## 2021-07-09 ENCOUNTER — Other Ambulatory Visit: Payer: Self-pay

## 2021-07-09 ENCOUNTER — Other Ambulatory Visit: Payer: Self-pay | Admitting: Obstetrics and Gynecology

## 2021-07-09 NOTE — Patient Instructions (Signed)
Visit Information  Ms. [name]  - as a part of your Medicaid benefit, you are eligible for care management and care coordination services at no cost or copay.  We have been unable to reach you by phone, but would be happy to help you with your health related needs. Please feel free to call me at [phone].  Aida Raider RN, BSN [place]  [facility] - Managed Medicaid High Risk [phone].

## 2021-07-09 NOTE — Patient Outreach (Signed)
Care Coordination  07/09/2021  SHANTASIA HUNNELL [birth year] [id number]   Medicaid Managed Care   Unsuccessful Outreach Note  07/09/2021 Name: MYKAILA BLUNCK MRN: [id number] DOB: 1965-12-02  Referred by: [name], MD Reason for referral : High Risk Managed Medicaid (Unsuccessful telephone outreach)   Third unsuccessful telephone outreach was attempted today. The patient was referred to the case management team for assistance with care management and care coordination. The patient's primary care provider has been notified of our unsuccessful attempts to make or maintain contact with the patient. The care management team is pleased to engage with this patient at any time in the future should he/she be interested in assistance from the care management team.   Follow Up Plan: We have been unable to make contact with the patient for follow up. The care management team is available to follow up with the patient after provider conversation with the patient regarding recommendation for care management engagement and subsequent re-referral to the care management team.   Aida Raider RN, BSN [place] Management Coordinator - Managed [facility] High Risk [phone]

## 2021-07-10 ENCOUNTER — Other Ambulatory Visit: Payer: Self-pay | Admitting: Cardiovascular Disease

## 2021-07-10 ENCOUNTER — Other Ambulatory Visit: Payer: Self-pay

## 2021-07-10 ENCOUNTER — Other Ambulatory Visit (HOSPITAL_COMMUNITY): Payer: Self-pay | Admitting: Cardiovascular Disease

## 2021-07-10 ENCOUNTER — Telehealth: Payer: Self-pay | Admitting: Cardiovascular Disease

## 2021-07-10 DIAGNOSIS — I482 Chronic atrial fibrillation, unspecified: Secondary | ICD-10-CM

## 2021-07-10 MED ORDER — SPIRONOLACTONE 25 MG PO TABS
12.5000 mg | ORAL_TABLET | Freq: Every day | ORAL | 3 refills | Status: DC
  Filled 2021-07-10 – 2021-11-01 (×3): qty 45, 90d supply, fill #0

## 2021-07-10 MED ORDER — DIGOXIN 250 MCG PO TABS
0.2500 mg | ORAL_TABLET | Freq: Every day | ORAL | 3 refills | Status: DC
  Filled 2021-07-10 – 2022-04-02 (×6): qty 90, 90d supply, fill #0

## 2021-07-10 MED ORDER — LOSARTAN POTASSIUM 25 MG PO TABS
12.5000 mg | ORAL_TABLET | Freq: Every day | ORAL | 3 refills | Status: DC
  Filled 2021-07-10 – 2022-03-31 (×4): qty 45, 90d supply, fill #0
  Filled 2022-06-26: qty 45, 90d supply, fill #1

## 2021-07-10 MED ORDER — RIVAROXABAN 20 MG PO TABS
ORAL_TABLET | Freq: Every day | ORAL | 1 refills | Status: DC
Start: 2021-07-10 — End: 2022-03-25
  Filled 2021-07-10 – 2021-07-17 (×2): qty 90, 90d supply, fill #0
  Filled 2021-10-23: qty 90, 90d supply, fill #1
  Filled 2021-10-24 – 2021-11-01 (×2): qty 90, 90d supply, fill #0

## 2021-07-10 MED ORDER — DAPAGLIFLOZIN PROPANEDIOL 10 MG PO TABS
10.0000 mg | ORAL_TABLET | Freq: Every day | ORAL | 3 refills | Status: DC
  Filled 2021-07-10 – 2021-11-01 (×4): qty 30, 30d supply, fill #0
  Filled 2022-03-25: qty 30, 30d supply, fill #1
  Filled 2022-04-01: qty 30, 30d supply, fill #0
  Filled 2022-06-25: qty 30, 30d supply, fill #1

## 2021-07-10 NOTE — Telephone Encounter (Signed)
Refills has been sent to the pharmacy. 

## 2021-07-10 NOTE — Addendum Note (Signed)
Addended by: Allean Found on: 07/10/2021 02:41 PM   Modules accepted: Orders

## 2021-07-10 NOTE — Telephone Encounter (Signed)
Prescription refill request for Xarelto received.  Indication:AFIB Last office visit:KELLY 06/24/21  Weight:93.1KG Age:57F Scr:0.92 04/09/21 CrCl:99.2

## 2021-07-10 NOTE — Telephone Encounter (Signed)
*  STAT* If patient is at the pharmacy, call can be transferred to refill team.   1. Which medications need to be refilled? (please list name of each medication and dose if known)  new prescriptions- changing pharmacy- Xarelto, Losartan, Digoxin, Farxiga and Spironolactone  2. Which pharmacy/location (including street and city if local pharmacy) is medication to be sent to?[name]  3. Do they need a 30 day or 90 day supply? 90 days and refill- pt is out of her medicine

## 2021-07-11 ENCOUNTER — Other Ambulatory Visit: Payer: Self-pay

## 2021-07-15 ENCOUNTER — Other Ambulatory Visit: Payer: Self-pay

## 2021-07-16 ENCOUNTER — Other Ambulatory Visit: Payer: Self-pay

## 2021-07-16 ENCOUNTER — Ambulatory Visit: Payer: Medicaid Other | Attending: Family Medicine | Admitting: Family Medicine

## 2021-07-16 ENCOUNTER — Encounter: Payer: Self-pay | Admitting: Family Medicine

## 2021-07-16 VITALS — BP 131/89 | HR 99 | Resp 16 | Wt 196.8 lb

## 2021-07-16 DIAGNOSIS — J3489 Other specified disorders of nose and nasal sinuses: Secondary | ICD-10-CM | POA: Diagnosis not present

## 2021-07-16 DIAGNOSIS — I4891 Unspecified atrial fibrillation: Secondary | ICD-10-CM | POA: Diagnosis not present

## 2021-07-16 DIAGNOSIS — I5022 Chronic systolic (congestive) heart failure: Secondary | ICD-10-CM | POA: Diagnosis not present

## 2021-07-16 DIAGNOSIS — I110 Hypertensive heart disease with heart failure: Secondary | ICD-10-CM | POA: Diagnosis not present

## 2021-07-16 DIAGNOSIS — E1159 Type 2 diabetes mellitus with other circulatory complications: Secondary | ICD-10-CM | POA: Diagnosis not present

## 2021-07-16 DIAGNOSIS — I152 Hypertension secondary to endocrine disorders: Secondary | ICD-10-CM

## 2021-07-16 DIAGNOSIS — E119 Type 2 diabetes mellitus without complications: Secondary | ICD-10-CM | POA: Diagnosis not present

## 2021-07-16 DIAGNOSIS — Z23 Encounter for immunization: Secondary | ICD-10-CM

## 2021-07-16 LAB — POCT GLYCOSYLATED HEMOGLOBIN (HGB A1C): HbA1c, POC (controlled diabetic range): 6.1 % (ref 0.0–7.0)

## 2021-07-16 LAB — GLUCOSE, POCT (MANUAL RESULT ENTRY): POC Glucose: 118 mg/dl — AB (ref 70–99)

## 2021-07-16 MED ORDER — CETIRIZINE HCL 10 MG PO TABS
10.0000 mg | ORAL_TABLET | Freq: Every day | ORAL | 1 refills | Status: DC
  Filled 2021-07-16: qty 30, 30d supply, fill #0
  Filled 2021-10-23: qty 30, 30d supply, fill #1
  Filled 2021-10-24 – 2021-11-01 (×2): qty 30, 30d supply, fill #0

## 2021-07-16 MED ORDER — FUROSEMIDE 20 MG PO TABS
40.0000 mg | ORAL_TABLET | Freq: Every day | ORAL | 3 refills | Status: DC
  Filled 2021-07-16 – 2021-07-17 (×2): qty 60, 30d supply, fill #0
  Filled 2021-09-25: qty 60, 30d supply, fill #1
  Filled 2021-09-26 – 2021-10-14 (×3): qty 60, 30d supply, fill #0
  Filled 2022-06-25 – 2022-06-26 (×2): qty 60, 30d supply, fill #1

## 2021-07-16 NOTE — Progress Notes (Signed)
Pt states the Metformin is giving her diarrhea and she has stopped taking it

## 2021-07-16 NOTE — Progress Notes (Signed)
Subjective:  Patient ID: [name], female    DOB: 02/02/1966  Age: 55 y.o. MRN: [id number]  CC: Diabetes   HPI [name] is a 55 y.o. year old female with a history of atrial fibrillation/atrial flutter (status post cardioversion in 02/2016 currently on rate control with metoprolol and Cardizem and anticoagulation Xarelto s/p DCCV in 2017 ), cerebral infarction due to embolism of right middle cerebral artery status post IV TPA (in 10/2015),  right MCA stroke in 01/2018 (after running out of Xarelto), Type 2 DM (A1c 6.1)  She was hospitalized in 03/2021 for acute CHF and echo revealed EF of 25 to 30%, severely decreased left ventricular function, LVH Cardiac cath revealed: 60% mid LAD after second diagonal. 75% distal circumflex before small third obtuse marginal. Right dominant anatomy Mild pulmonary hypertension with mean pressure 31 mmHg, mean capillary wedge pressure 16 mmHg, pulmonary vascular resistance 2.56 Wood units. Pulmonary artery O2 saturation 73%, cardiac output and index are 5.87 L/min and 3.01 L/min/m respectively.   RECOMMENDATIONS:   Guideline directed therapy for systolic dysfunction Rate control Further management per treating team.  Interval History: Metformin causes Diarrhea  and so she stopped it has been compliant with [place].  Denies presence of neuropathy, hypoglycemia and adheres to diabetic diet.  She complains when she kneels or rests her elbows she has a sharp pain and feels like her skin is thin. Symptoms did not start until she was diuresed during her hospitalization in 03/2021 She complains of persistent rhinorrhea since  hopitalization and has also had sinus pressure.  Denies presence of chest pain, palpitation, dyspnea. She followed up with her electrophysiologist Dr. Claiborne Billings in 06/24/2021 and notes reviewed with plans to adjust her BiPAP machine for better sleep quality. Past Medical History:  Diagnosis Date   Clotting disorder ([place])    on  xarelto   Essential hypertension    Heart murmur    a. 10/2015 Echo: EF 60-65%, no rwma, mild AI/MR, sev dil LA/RA, PASP 32mmHg.   Noncompliance    NSVT (nonsustained ventricular tachycardia)    a. 10/2015 during admission for CVA/AF.   Persistent atrial fibrillation ([place])    a. CHA2DS2VASC = 4-->Xarelto;  b. 02/2016 Successful DCCV.  c. Recurrent fib   Pneumonia 2012 x 2; 2015   Stroke [facility])    a. 03/9389 Embolic CVA of mid right middle cerebral atery - recieved TPA-->small amt of asymptomatic hemorrhagic transformation.   Transient ischemic attack (TIA) 01/2013    Past Surgical History:  Procedure Laterality Date   CARDIOVERSION N/A 02/18/2013   Procedure: CARDIOVERSION;  Surgeon: Sanda Klein, MD;  Location: [place] [facility];  Service: Cardiovascular;  Laterality: N/A;   CARDIOVERSION N/A 02/23/2013   Procedure: CARDIOVERSION;  Surgeon: Leonie Man, MD;  Location: [place];  Service: Cardiovascular;  Laterality: N/A;  [place] CV   CARDIOVERSION N/A 03/12/2016   Procedure: CARDIOVERSION;  Surgeon: Larey Dresser, MD;  Location: [place];  Service: Cardiovascular;  Laterality: N/A;   RIGHT/LEFT HEART CATH AND CORONARY ANGIOGRAPHY N/A 04/02/2021   Procedure: RIGHT/LEFT HEART CATH AND CORONARY ANGIOGRAPHY;  Surgeon: Belva Crome, MD;  Location: [place] [facility];  Service: Cardiovascular;  Laterality: N/A;   TEE WITHOUT CARDIOVERSION N/A 02/18/2013   Procedure: TRANSESOPHAGEAL ECHOCARDIOGRAM (TEE);  Surgeon: Sanda Klein, MD;  Location: [facility] ENDOSCOPY;  Service: Cardiovascular;  Laterality: N/A;   TUBAL LIGATION  1992    Family History  Problem Relation Age of Onset   Cancer Mother  Atrial fibrillation Mother    Breast cancer Mother    Hypertension Father    Atrial fibrillation Father    Peripheral vascular disease Father    Hypertension Other    Diabetes Other    Heart attack Neg Hx    Stroke Neg Hx    Rectal cancer Neg Hx    Colon cancer Neg Hx    Esophageal cancer Neg Hx     Stomach cancer Neg Hx     Allergies  Allergen Reactions   Penicillins Hives and Other (See Comments)    Unknown  Has patient had a PCN reaction causing immediate rash, facial/tongue/throat swelling, SOB or lightheadedness with hypotension: No Has patient had a PCN reaction causing severe rash involving mucus membranes or skin necrosis: No Has patient had a PCN reaction that required hospitalization No Has patient had a PCN reaction occurring within the last 10 years: No If all of the above answers are "NO", then may proceed with Cephalosporin use.    Outpatient Medications Prior to Visit  Medication Sig Dispense Refill   Accu-Chek FastClix Lancets MISC USE AS INSTRUCTED TO CHECK BLOOD SUGAR ONCE DAILY. 102 each 0   acetaminophen (TYLENOL) 500 MG tablet Take 1,000 mg by mouth every 6 (six) hours as needed for headache.      atorvastatin (LIPITOR) 80 MG tablet Take 1 tablet (80 mg total) by mouth daily at 6 PM. 30 tablet 2   Blood Glucose Monitoring Suppl (ACCU-CHEK GUIDE ME) w/Device KIT 1 kit by Does not apply route daily. 1 kit 0   butalbital-acetaminophen-caffeine (FIORICET) 50-325-40 MG tablet TAKE 1 TABLET BY MOUTH EVERY 12 HOURS AS NEEDED. 40 tablet 0   dapagliflozin propanediol (FARXIGA) 10 MG TABS tablet Take 1 tablet (10 mg total) by mouth daily. 90 tablet 3   digoxin (LANOXIN) 0.25 MG tablet Take 1 tablet (0.25 mg total) by mouth daily. 90 tablet 3   furosemide (LASIX) 40 MG tablet Take 1 tablet (40 mg total) by mouth daily. 30 tablet 2   gabapentin (NEURONTIN) 300 MG capsule TAKE 1 CAPSULE (300 MG TOTAL) BY MOUTH 2 (TWO) TIMES DAILY. 90 capsule 0   glucose blood (ACCU-CHEK GUIDE) test strip Use as instructed 100 strip 0   losartan (COZAAR) 25 MG tablet Take 0.5 tablets (12.5 mg total) by mouth daily. 45 tablet 3   metFORMIN (GLUCOPHAGE) 500 MG tablet Take 1 tablet (500 mg total) by mouth 2 (two) times daily with a meal. (Patient not taking: Reported on 07/16/2021) 60 tablet 6    methocarbamol (ROBAXIN) 500 MG tablet Take 1 tablet (500 mg total) by mouth every 8 (eight) hours as needed for muscle spasms. 90 tablet 0   metoprolol succinate (TOPROL-XL) 100 MG 24 hr tablet Take 1 tablet (100 mg total) by mouth daily. Take with or immediately following a meal. 30 tablet 2   metoprolol succinate (TOPROL-XL) 50 MG 24 hr tablet Take 1 tablet (50 mg total) by mouth daily. Take with or immediately following a meal. 30 tablet 2   Misc. Devices MISC Blood pressure monitor.  Diagnosis Hypertension 1 each 0   rivaroxaban (XARELTO) 20 MG TABS tablet TAKE 1 TABLET (20 MG TOTAL) BY MOUTH DAILY WITH SUPPER. 90 tablet 1   spironolactone (ALDACTONE) 25 MG tablet Take 0.5 tablets (12.5 mg total) by mouth daily. 45 tablet 3   No facility-administered medications prior to visit.     ROS Review of Systems  Constitutional:  Negative for activity change, appetite change and  fatigue.  HENT:  Negative for congestion, sinus pressure and sore throat.   Eyes:  Negative for visual disturbance.  Respiratory:  Negative for cough, chest tightness, shortness of breath and wheezing.   Cardiovascular:  Negative for chest pain and palpitations.  Gastrointestinal:  Negative for abdominal distention, abdominal pain and constipation.  Endocrine: Negative for polydipsia.  Genitourinary:  Negative for dysuria and frequency.  Musculoskeletal:  Negative for arthralgias and back pain.  Skin:  Negative for rash.  Neurological:  Negative for tremors, light-headedness and numbness.  Hematological:  Does not bruise/bleed easily.  Psychiatric/Behavioral:  Negative for agitation and behavioral problems.    Objective:  BP 131/89   Pulse 99   Resp 16   Wt 196 lb 12.8 oz (89.3 kg)   SpO2 99%   BMI 33.78 kg/m   BP/Weight 07/16/2021 06/24/2021 03/24/6268  Systolic BP 485 462 703  Diastolic BP 89 76 90  Wt. (Lbs) 196.8 205.2 192  BMI 33.78 35.22 32.96      Physical Exam Constitutional:       Appearance: She is well-developed.  Cardiovascular:     Rate and Rhythm: Normal rate. Rhythm irregular.     Heart sounds: Normal heart sounds. No murmur heard. Pulmonary:     Effort: Pulmonary effort is normal.     Breath sounds: Normal breath sounds. No wheezing or rales.  Chest:     Chest wall: No tenderness.  Abdominal:     General: Bowel sounds are normal. There is no distension.     Palpations: Abdomen is soft. There is no mass.     Tenderness: There is no abdominal tenderness.  Musculoskeletal:        General: Normal range of motion.     Right lower leg: No edema.     Left lower leg: No edema.  Neurological:     Mental Status: She is alert and oriented to person, place, and time.  Psychiatric:        Mood and Affect: Mood normal.   Diabetic Foot Exam - Simple   Simple Foot Form  07/16/2021  8:20 AM  Visual Inspection No deformities, no ulcerations, no other skin breakdown bilaterally: Yes Sensation Testing Intact to touch and monofilament testing bilaterally: Yes Pulse Check Posterior Tibialis and Dorsalis pulse intact bilaterally: Yes Comments     CMP Latest Ref Rng & Units 04/09/2021 04/04/2021 04/03/2021  Glucose 70 - 99 mg/dL 102(H) 110(H) 100(H)  BUN 6 - 20 mg/dL $Remove'10 10 8  'AZBnVzU$ Creatinine 0.44 - 1.00 mg/dL 0.92 0.89 0.89  Sodium 135 - 145 mmol/L 138 135 138  Potassium 3.5 - 5.1 mmol/L 4.2 3.9 4.1  Chloride 98 - 111 mmol/L 105 104 106  CO2 22 - 32 mmol/L 25 21(L) 25  Calcium 8.9 - 10.3 mg/dL 9.3 9.1 9.0  Total Protein 6.0 - 8.5 g/dL - - -  Total Bilirubin 0.0 - 1.2 mg/dL - - -  Alkaline Phos 39 - 117 IU/L - - -  AST 0 - 40 IU/L - - -  ALT 0 - 32 IU/L - - -    Lipid Panel     Component Value Date/Time   CHOL 111 03/29/2021 0036   CHOL 101 10/21/2019 1458   TRIG 71 03/29/2021 0036   HDL 18 (L) 03/29/2021 0036   HDL 36 (L) 10/21/2019 1458   CHOLHDL 6.2 03/29/2021 0036   VLDL 14 03/29/2021 0036   LDLCALC 79 03/29/2021 0036   LDLCALC 49 10/21/2019 1458  CBC    Component Value Date/Time   WBC 8.3 04/03/2021 0043   RBC 5.23 (H) 04/03/2021 0043   HGB 13.2 04/03/2021 0043   HCT 42.5 04/03/2021 0043   PLT 279 04/03/2021 0043   MCV 81.3 04/03/2021 0043   MCH 25.2 (L) 04/03/2021 0043   MCHC 31.1 04/03/2021 0043   RDW 15.4 04/03/2021 0043   LYMPHSABS 1.8 03/28/2021 0529   MONOABS 0.6 03/28/2021 0529   EOSABS 0.1 03/28/2021 0529   BASOSABS 0.1 03/28/2021 0529    Lab Results  Component Value Date   HGBA1C 6.1 07/16/2021    Assessment & Plan:  1. Type 2 diabetes mellitus without complication, without long-term current use of insulin (HCC) Controlled with A1c of 6.1; goal is less than 7.0 Discontinue metformin due to GI side effects and she will continue with [place] Counseled on Diabetic diet, my plate method, 600 minutes of moderate intensity exercise/week Blood sugar logs with fasting goals of 80-120 mg/dl, random of less than 180 and in the event of sugars less than 60 mg/dl or greater than 400 mg/dl encouraged to notify the clinic. Advised on the need for annual eye exams, annual foot exams, Pneumonia vaccine.  - POCT glucose (manual entry) - POCT glycosylated hemoglobin (Hb A1C)  2. Need for immunization against influenza - Flu Vaccine QUAD 82mo+IM (Fluarix, Fluzone & Alfiuria Quad PF)  3. Hypertension associated with diabetes ([place]) Controlled Counseled on blood pressure goal of less than 130/80, low-sodium, DASH diet, medication compliance, 150 minutes of moderate intensity exercise per week. Discussed medication compliance, adverse effects. - Basic Metabolic Panel  4. Atrial fibrillation with RVR (HCC) Currently in A. fib Continue anticoagulation with Xarelto and rate control with digoxin Follow-up with cardiology  5. Rhinorrhea Possibly sinus related Will place an antihistamine - cetirizine (ZYRTEC) 10 MG tablet; Take 1 tablet (10 mg total) by mouth daily.  Dispense: 30 tablet; Refill: 1  6. Need for  pneumococcal vaccine - Pneumococcal conjugate vaccine 20-valent  7. Hypertensive heart disease with chronic systolic congestive heart failure ([place]) EF of 25 to 30% from Echo of 03/2021 She is euvolemic Given complaints of extra sensitivity of joints when pressure is applied I will decrease her Lasix dose to 20 mg daily to see if she will have any benefit from this Advised that if she is retaining fluid or gaining weight (>5 lbs/week or 3lb/day) she can revert to $RemoveB'40mg'XXIMkAtg$  of Lasix Continue GDMT - furosemide (LASIX) 20 MG tablet; Take 2 tablets (40 mg total) by mouth daily.  Dispense: 60 tablet; Refill: 3   No orders of the defined types were placed in this encounter.   Return in about 6 months (around 01/13/2022) for medical conditions.   48 minutes of total face to face time spent including median intraservice time reviewing previous notes and test results, counseling patient on diagnosis of Diabetes and CHF in addition to chronic medical conditions.Time also spent ordering medications, investigations and documenting in the chart.  All questions were answered to the patient's satisfaction      [name], MD, FAAFP. [facility] [place], [place]   07/16/2021, 3:23 PM

## 2021-07-17 ENCOUNTER — Telehealth: Payer: Self-pay

## 2021-07-17 ENCOUNTER — Other Ambulatory Visit: Payer: Self-pay

## 2021-07-17 DIAGNOSIS — E119 Type 2 diabetes mellitus without complications: Secondary | ICD-10-CM

## 2021-07-17 LAB — BASIC METABOLIC PANEL
BUN/Creatinine Ratio: 13 (ref 9–23)
BUN: 14 mg/dL (ref 6–24)
CO2: 20 mmol/L (ref 20–29)
Calcium: 9.8 mg/dL (ref 8.7–10.2)
Chloride: 102 mmol/L (ref 96–106)
Creatinine, Ser: 1.05 mg/dL — ABNORMAL HIGH (ref 0.57–1.00)
Glucose: 93 mg/dL (ref 70–99)
Potassium: 5.2 mmol/L (ref 3.5–5.2)
Sodium: 141 mmol/L (ref 134–144)
eGFR: 62 mL/min/{1.73_m2} (ref >59)

## 2021-07-17 MED ORDER — FREESTYLE LIBRE 2 READER DEVI
0 refills | Status: DC
  Filled 2021-07-17: qty 1, 1d supply, fill #0

## 2021-07-17 MED ORDER — FREESTYLE LIBRE 2 SENSOR MISC
2 refills | Status: DC
  Filled 2021-07-17: qty 2, 28d supply, fill #0

## 2021-07-17 NOTE — Telephone Encounter (Signed)
Freestyle Libre 2 covered. I sent this rx in but it requires a PA. Information passed to [place] in the pharmacy.

## 2021-07-17 NOTE — Telephone Encounter (Signed)
Patient is wanting to know which CGM meter is covered by her insurance.

## 2021-07-18 ENCOUNTER — Other Ambulatory Visit: Payer: Self-pay

## 2021-07-18 ENCOUNTER — Telehealth: Payer: Self-pay

## 2021-07-18 NOTE — Telephone Encounter (Signed)
Freestyle [place] PA denied.  For coverage, patient must be insulin dependent, insulin treatment regimen requires frequent adjustment, or pt is using an external insulin pump.

## 2021-07-18 NOTE — Telephone Encounter (Signed)
Noted  

## 2021-07-19 ENCOUNTER — Ambulatory Visit (INDEPENDENT_AMBULATORY_CARE_PROVIDER_SITE_OTHER): Payer: Medicaid Other | Admitting: Cardiovascular Disease

## 2021-07-19 ENCOUNTER — Other Ambulatory Visit: Payer: Self-pay

## 2021-07-19 ENCOUNTER — Encounter: Payer: Self-pay | Admitting: Cardiovascular Disease

## 2021-07-19 ENCOUNTER — Telehealth: Payer: Self-pay

## 2021-07-19 VITALS — BP 148/84 | HR 84 | Ht 64.0 in | Wt 198.2 lb

## 2021-07-19 DIAGNOSIS — I4811 Longstanding persistent atrial fibrillation: Secondary | ICD-10-CM

## 2021-07-19 DIAGNOSIS — I2510 Atherosclerotic heart disease of native coronary artery without angina pectoris: Secondary | ICD-10-CM | POA: Diagnosis not present

## 2021-07-19 DIAGNOSIS — I10 Essential (primary) hypertension: Secondary | ICD-10-CM

## 2021-07-19 DIAGNOSIS — I5042 Chronic combined systolic (congestive) and diastolic (congestive) heart failure: Secondary | ICD-10-CM | POA: Diagnosis not present

## 2021-07-19 DIAGNOSIS — E785 Hyperlipidemia, unspecified: Secondary | ICD-10-CM | POA: Diagnosis not present

## 2021-07-19 DIAGNOSIS — Z9989 Dependence on other enabling machines and devices: Secondary | ICD-10-CM

## 2021-07-19 DIAGNOSIS — G4733 Obstructive sleep apnea (adult) (pediatric): Secondary | ICD-10-CM

## 2021-07-19 DIAGNOSIS — Z87448 Personal history of other diseases of urinary system: Secondary | ICD-10-CM

## 2021-07-19 DIAGNOSIS — E119 Type 2 diabetes mellitus without complications: Secondary | ICD-10-CM

## 2021-07-19 NOTE — Telephone Encounter (Signed)
Can you please inform her? Thanks

## 2021-07-19 NOTE — Progress Notes (Signed)
Cardiology Office Note:    Date:  07/20/2021   ID:  SHERELLE CASTELLI, DOB 1965/11/20, MRN [id number]  PCP:  [name], MD   [facility] Providers Cardiologist:  Sanda Klein, MD     Referring MD: [name], MD   Chief Complaint  Patient presents with   Congestive Heart Failure         History of Present Illness:    [name] is a 55 y.o. 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  She returns for follow-up and is feeling well.  Has NYHA functional class I status.  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  She can lift heavy pots of food without shortness of breath.  She does have to slow her pace a little bit after climbing 1 flight of stairs, but dyspnea does not interfere with usual daily activities.  She has not had dizziness, palpitations or syncope.  She has not had any falls, injuries or serious bleeding problems.  She denies any new focal neurological complaints.  She does not have claudication or lower extremity edema.  She denies orthopnea and PND.  Rate control was quite difficult and she is taking digoxin in addition to moderately high dose of metoprolol succinate 100 mg once daily.  She is on losartan, spironolactone and Farxiga for heart failure.  She reports using her CPAP and denies daytime hypersomnolence.    Past Medical History:  Diagnosis Date    Clotting disorder ([place])    on xarelto   Essential hypertension    Heart murmur    a. 10/2015 Echo: EF 60-65%, no rwma, mild AI/MR, sev dil LA/RA, PASP 83mmHg.   Noncompliance    NSVT (nonsustained ventricular tachycardia)    a. 10/2015 during admission for CVA/AF.   Persistent atrial fibrillation ([place])    a. CHA2DS2VASC = 4-->Xarelto;  b. 02/2016 Successful DCCV.  c. Recurrent fib   Pneumonia 2012 x 2; 2015   Stroke [facility])    a. 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   Transient ischemic attack (TIA) 01/2013    Past Surgical History:  Procedure Laterality Date   CARDIOVERSION N/A 02/18/2013   Procedure: CARDIOVERSION;  Surgeon: Sanda Klein, MD;  Location: [place] [facility];  Service: Cardiovascular;  Laterality: N/A;   CARDIOVERSION N/A 02/23/2013   Procedure: CARDIOVERSION;  Surgeon: Leonie Man, MD;  Location: [place];  Service: Cardiovascular;  Laterality: N/A;  [place] CV   CARDIOVERSION N/A 03/12/2016   Procedure: CARDIOVERSION;  Surgeon: Larey Dresser, MD;  Location: [place];  Service: Cardiovascular;  Laterality: N/A;   RIGHT/LEFT HEART CATH AND CORONARY ANGIOGRAPHY N/A 04/02/2021   Procedure: RIGHT/LEFT HEART CATH AND CORONARY ANGIOGRAPHY;  Surgeon: Belva Crome, MD;  Location: [place] CV LAB;  Service: Cardiovascular;  Laterality: N/A;   TEE WITHOUT CARDIOVERSION N/A 02/18/2013   Procedure: TRANSESOPHAGEAL ECHOCARDIOGRAM (TEE);  Surgeon: Sanda Klein, MD;  Location:  [facility];  Service: Cardiovascular;  Laterality: N/A;   TUBAL LIGATION  1992    Current Medications: Current Meds  Medication Sig   Accu-Chek FastClix Lancets MISC USE AS INSTRUCTED TO CHECK BLOOD SUGAR ONCE DAILY.   acetaminophen (TYLENOL) 500 MG tablet Take 1,000 mg by mouth every 6 (six) hours as needed for headache.    atorvastatin (LIPITOR) 80 MG tablet Take 1 tablet (80 mg total) by mouth daily at 6 PM.   Blood  Glucose Monitoring Suppl (ACCU-CHEK GUIDE ME) w/Device KIT 1 kit by Does not apply route daily.   butalbital-acetaminophen-caffeine (FIORICET) 50-325-40 MG tablet TAKE 1 TABLET BY MOUTH EVERY 12 HOURS AS NEEDED.   cetirizine (ZYRTEC) 10 MG tablet Take 1 tablet (10 mg total) by mouth daily.   Continuous Blood Gluc Receiver (FREESTYLE LIBRE 2 READER) DEVI Use to check blood sugar 3 times daily   Continuous Blood Gluc Sensor (FREESTYLE LIBRE 2 SENSOR) MISC Use to check blood sugar 3 times daily   dapagliflozin propanediol (FARXIGA) 10 MG TABS tablet Take 1 tablet (10 mg total) by mouth daily.   digoxin (LANOXIN) 0.25 MG tablet Take 1 tablet (0.25 mg total) by mouth daily.   furosemide (LASIX) 20 MG tablet Take 2 tablets (40 mg total) by mouth daily.   gabapentin (NEURONTIN) 300 MG capsule TAKE 1 CAPSULE (300 MG TOTAL) BY MOUTH 2 (TWO) TIMES DAILY.   glucose blood (ACCU-CHEK GUIDE) test strip Use as instructed   losartan (COZAAR) 25 MG tablet Take 0.5 tablets (12.5 mg total) by mouth daily.   [EXPIRED] methocarbamol (ROBAXIN) 500 MG tablet Take 1 tablet (500 mg total) by mouth every 8 (eight) hours as needed for muscle spasms.   metoprolol succinate (TOPROL-XL) 100 MG 24 hr tablet Take 1 tablet (100 mg total) by mouth daily. Take with or immediately following a meal.   metoprolol succinate (TOPROL-XL) 50 MG 24 hr tablet Take 1 tablet (50 mg total) by mouth daily. Take with or immediately following a meal.   Misc. Devices MISC Blood pressure monitor.  Diagnosis Hypertension   rivaroxaban (XARELTO) 20 MG TABS tablet TAKE 1 TABLET (20 MG TOTAL) BY MOUTH DAILY WITH SUPPER.   spironolactone (ALDACTONE) 25 MG tablet Take 0.5 tablets (12.5 mg total) by mouth daily.     Allergies:   Penicillins   Social History   Socioeconomic History   Marital status: Single    Spouse name: Not on file   Number of children: Not on file   Years of education: Not on file   Highest education level: Not on file   Occupational History   Occupation: Training and development officer    Employer: [employer]   Occupation: in home health aide  Tobacco Use   Smoking status: Former    Years: 3.00    Types: Cigarettes    Quit date: 03/15/2013    Years since quitting: 8.3   Smokeless tobacco: Never  Vaping Use   Vaping Use: Never used  Substance and Sexual Activity   Alcohol use: Yes    Comment: drinks on the weekends,   2-3 beers   Drug use: No   Sexual activity: Not Currently    Birth control/protection: None  Other Topics Concern   Not on file  Social History Narrative   Not on file   Social Determinants of Health   Financial Resource Strain: Not on file  Food Insecurity: Not on file  Transportation Needs: Not on file  Physical Activity: Not on file  Stress:  Not on file  Social Connections: Not on file     Family History: The patient's family history includes Atrial fibrillation in her father and mother; Breast cancer in her mother; Cancer in her mother; Diabetes in an other family member; Hypertension in her father and another family member; Peripheral vascular disease in her father. There is no history of Heart attack, Stroke, Rectal cancer, Colon cancer, Esophageal cancer, or Stomach cancer.  ROS:   Please see the history of present illness.     All other systems reviewed and are negative.  EKGs/Labs/Other Studies Reviewed:    The following studies were reviewed today: Cardiac catheterization July 2022  60% mid LAD after second diagonal. 75% distal circumflex before small third obtuse marginal. Right dominant anatomy Mild pulmonary hypertension with mean pressure 31 mmHg, mean capillary wedge pressure 16 mmHg, pulmonary vascular resistance 2.56 Wood units. Pulmonary artery O2 saturation 73%, cardiac output and index are 5.87 L/min and 3.01 L/min/m respectively.  Echo 03/28/2021           1. Left ventricular ejection fraction, by estimation, is 25 to 30%. The left ventricle has severely  decreased function. The left ventricle demonstrates global hypokinesis. There is mild left ventricular hypertrophy. Left ventricular diastolic parameters  are indeterminate.   2. Right ventricular systolic function is mildly reduced. The right ventricular size is moderately enlarged. There is normal pulmonary artery systolic pressure. The estimated right ventricular systolic pressure is 11.9 mmHg.   3. Left atrial size was mildly dilated.   4. Right atrial size was moderately dilated.   5. The mitral valve is normal in structure. Moderate mitral valve regurgitation. No evidence of mitral stenosis.   6. Tricuspid valve regurgitation is severe. Suspect functional from annular dilatation.   7. The aortic valve is tricuspid. Aortic valve regurgitation is mild. No aortic stenosis is present.   8. The inferior vena cava is dilated in size with >50% respiratory variability, suggesting right atrial pressure of 8 mmHg.   9. The patient was in atrial fibrillation. 10. Significant change in LV and RV function compared to prior echo.  EKG:  EKG is not ordered today.  The ekg ordered   Recent Labs: 03/28/2021: TSH 2.122 04/03/2021: Hemoglobin 13.2; Platelets 279 04/04/2021: Magnesium 2.0 04/09/2021: B Natriuretic Peptide 269.5 07/16/2021: BUN 14; Creatinine, Ser 1.05; Potassium 5.2; Sodium 141  Recent Lipid Panel    Component Value Date/Time   CHOL 111 03/29/2021 0036   CHOL 101 10/21/2019 1458   TRIG 71 03/29/2021 0036   HDL 18 (L) 03/29/2021 0036   HDL 36 (L) 10/21/2019 1458   CHOLHDL 6.2 03/29/2021 0036   VLDL 14 03/29/2021 0036   LDLCALC 79 03/29/2021 0036   LDLCALC 49 10/21/2019 1458     Risk Assessment/Calculations:    CHA2DS2-VASc Score = 6   This indicates a 9.7% annual risk of stroke. The patient's score is based upon: CHF History: 1 HTN History: 1 Diabetes History: 1 Stroke History: 2 Vascular Disease History: 0 Age Score: 0 Gender Score: 1          Physical Exam:    VS:   BP (!) 148/84   Pulse 84   Ht $R'5\' 4"'kE$  (1.626 m)   Wt 198 lb 3.2 oz (89.9 kg)   SpO2 98%   BMI 34.02 kg/m     Wt Readings from Last 3 Encounters:  07/19/21 198 lb 3.2 oz (89.9 kg)  07/16/21 196 lb 12.8 oz (89.3 kg)  06/24/21 205 lb 3.2  oz (93.1 kg)     GEN: Moderately obese, well nourished, well developed in no acute distress HEENT: Normal NECK: No JVD; No carotid bruits LYMPHATICS: No lymphadenopathy CARDIAC: Irregular,  no murmurs, rubs, gallops RESPIRATORY:  Clear to auscultation without rales, wheezing or rhonchi  ABDOMEN: Soft, non-tender, non-distended MUSCULOSKELETAL:  No edema; No deformity  SKIN: Warm and dry NEUROLOGIC:  Alert and oriented x 3 PSYCHIATRIC:  Normal affect   ASSESSMENT:    1. Chronic combined systolic and diastolic CHF (congestive heart failure) ([place])   2. Longstanding persistent atrial fibrillation ([place])   3. Coronary artery disease involving native coronary artery of native heart without angina pectoris   4. Type 2 diabetes mellitus without complication, without long-term current use of insulin ([place])   5. Dyslipidemia (high LDL; low HDL)   6. History of renal insufficiency   7. Essential hypertension   8. OSA on CPAP    PLAN:    In order of problems listed above:  CHF: NYHA functional class I.  Appears clinically euvolemic.  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  She is on a relatively low dose of losartan and spironolactone due to previous problems with high blood pressure, although her blood pressure is a little higher today.  Recheck LVEF.  If EF has improved, will continue same medications.  If not, we will plan to transition to [facility].  Reminded her of restriction of dietary sodium and daily weight monitoring. AFib: Rate control is fair today.  She is on appropriate anticoagulation.  It is likely that the atrial fibrillation is a permanent arrhythmia, we are avoiding diltiazem due to decreased EF, but if her EF has  normalized would prefer using this and/or higher doses of beta-blocker rather than digoxin due to the toxicity risk. CAD: Moderate mid LAD stenosis and borderline significant distal left circumflex stenosis by recent angiography.  She does not have angina pectoris.  The focus is on risk factor modification. DM: Excellent glycemic control with a hemoglobin A1c of 6.1% just a couple of days ago.  I am very pleased that she is on an SGLT2 inhibitor for both diabetes and heart failure. HLP: Her LDL cholesterol is very close to target range (less than 70 and her triglycerides are normal, but she continues to have a very low HDL of 18, which will only improve with weight loss and increase physical exercise.  On maximum dose atorvastatin. History of AKI: Creatinine performed just a few days ago was excellent at 1.05.  With this improvement we can now use Entresto and higher doses of ARB and spironolactone, if needed. HTN: Mildly elevated today.  We will wait for results of echo before deciding how to modify her medications.  Target BP 130/80 or less. OSA: Encouraged 100% compliance with CPAP.           Medication Adjustments/Labs and Tests Ordered: Current medicines are reviewed at length with the patient today.  Concerns regarding medicines are outlined above.  Orders Placed This Encounter  Procedures   ECHOCARDIOGRAM LIMITED   No orders of the defined types were placed in this encounter.   Patient Instructions  Medication Instructions:  No changes *If you need a refill on your cardiac medications before your next appointment, please call your pharmacy*   Lab Work: None ordered If you have labs (blood work) drawn today and your tests are completely normal, you will receive your results only by: [place] (if you have MyChart) OR A paper copy in the mail  If you have any lab test that is abnormal or we need to change your treatment, we will call you to review the  results.   Testing/Procedures: Your physician has requested that you have an limited echocardiogram. Echocardiography is a painless test that uses sound waves to create images of your heart. It provides your doctor with information about the size and shape of your heart and how well your heart's chambers and valves are working. You may receive an ultrasound enhancing agent through an IV if needed to better visualize your heart during the echo.This procedure takes approximately one hour. There are no restrictions for this procedure. This will take place at the [address], [address].     Follow-Up: At [facility], you and your health needs are our priority.  As part of our continuing mission to provide you with exceptional heart care, we have created designated Provider Care Teams.  These Care Teams include your primary Cardiologist (physician) and Advanced Practice Providers (APPs -  Physician Assistants and Nurse Practitioners) who all work together to provide you with the care you need, when you need it.  We recommend signing up for the patient portal called "MyChart".  Sign up information is provided on this After Visit Summary.  MyChart is used to connect with patients for Virtual Visits (Telemedicine).  Patients are able to view lab/test results, encounter notes, upcoming appointments, etc.  Non-urgent messages can be sent to your provider as well.   To learn more about what you can do with MyChart, go to [email or web address].    Your next appointment:   Follow up with Dr. Sallyanne Kuster in March 2023   Signed, Sanda Klein, MD  07/20/2021 4:47 PM    [place]

## 2021-07-19 NOTE — Telephone Encounter (Signed)
Freestyle Libre PA's denied.  Medical criteria not met.

## 2021-07-19 NOTE — Patient Instructions (Signed)
Medication Instructions:  No changes *If you need a refill on your cardiac medications before your next appointment, please call your pharmacy*   Lab Work: None ordered If you have labs (blood work) drawn today and your tests are completely normal, you will receive your results only by: [place] (if you have MyChart) OR A paper copy in the mail If you have any lab test that is abnormal or we need to change your treatment, we will call you to review the results.   Testing/Procedures: Your physician has requested that you have an limited echocardiogram. Echocardiography is a painless test that uses sound waves to create images of your heart. It provides your doctor with information about the size and shape of your heart and how well your heart's chambers and valves are working. You may receive an ultrasound enhancing agent through an IV if needed to better visualize your heart during the echo.This procedure takes approximately one hour. There are no restrictions for this procedure. This will take place at the [address], [address].     Follow-Up: At [facility], you and your health needs are our priority.  As part of our continuing mission to provide you with exceptional heart care, we have created designated Provider Care Teams.  These Care Teams include your primary Cardiologist (physician) and Advanced Practice Providers (APPs -  Physician Assistants and Nurse Practitioners) who all work together to provide you with the care you need, when you need it.  We recommend signing up for the patient portal called "MyChart".  Sign up information is provided on this After Visit Summary.  MyChart is used to connect with patients for Virtual Visits (Telemedicine).  Patients are able to view lab/test results, encounter notes, upcoming appointments, etc.  Non-urgent messages can be sent to your provider as well.   To learn more about what you can do with MyChart, go to  [email or web address].    Your next appointment:   Follow up with Dr. Sallyanne Kuster in March 2023

## 2021-07-20 ENCOUNTER — Encounter: Payer: Self-pay | Admitting: Cardiovascular Disease

## 2021-07-22 ENCOUNTER — Ambulatory Visit: Payer: Medicaid Other

## 2021-07-23 ENCOUNTER — Other Ambulatory Visit: Payer: Self-pay

## 2021-07-23 ENCOUNTER — Ambulatory Visit: Payer: Medicaid Other | Attending: Family Medicine

## 2021-07-23 DIAGNOSIS — Z111 Encounter for screening for respiratory tuberculosis: Secondary | ICD-10-CM | POA: Diagnosis not present

## 2021-07-23 NOTE — Progress Notes (Signed)
Pt arrived for PPD test. PPD placed in right arm Pt to return in 48-72 hours.

## 2021-07-25 LAB — TB SKIN TEST
Induration: 0 mm
TB Skin Test: NEGATIVE

## 2021-07-31 ENCOUNTER — Encounter: Payer: Self-pay | Admitting: Neurology

## 2021-07-31 ENCOUNTER — Ambulatory Visit: Payer: Medicaid Other

## 2021-07-31 ENCOUNTER — Institutional Professional Consult (permissible substitution): Payer: Medicaid Other | Admitting: Neurology

## 2021-08-07 ENCOUNTER — Ambulatory Visit: Payer: Medicaid Other | Admitting: Cardiovascular Disease

## 2021-08-13 ENCOUNTER — Other Ambulatory Visit: Payer: Self-pay

## 2021-08-13 DIAGNOSIS — E119 Type 2 diabetes mellitus without complications: Secondary | ICD-10-CM | POA: Diagnosis not present

## 2021-08-13 DIAGNOSIS — H0102A Squamous blepharitis right eye, upper and lower eyelids: Secondary | ICD-10-CM | POA: Diagnosis not present

## 2021-08-13 DIAGNOSIS — H40053 Ocular hypertension, bilateral: Secondary | ICD-10-CM | POA: Diagnosis not present

## 2021-08-13 DIAGNOSIS — H0102B Squamous blepharitis left eye, upper and lower eyelids: Secondary | ICD-10-CM | POA: Diagnosis not present

## 2021-08-14 ENCOUNTER — Encounter: Payer: Self-pay | Admitting: Cardiology

## 2021-08-14 ENCOUNTER — Other Ambulatory Visit (HOSPITAL_COMMUNITY): Payer: Medicaid Other

## 2021-08-14 NOTE — Progress Notes (Unsigned)
Patient ID: [name], female   DOB: 09-29-1965, 55 y.o.   MRN: [id number]  Verified appointment "no show" status with Renetta at 2:25pm.

## 2021-08-15 ENCOUNTER — Encounter (HOSPITAL_COMMUNITY): Payer: Self-pay | Admitting: Cardiovascular Disease

## 2021-08-20 ENCOUNTER — Other Ambulatory Visit: Payer: Self-pay

## 2021-08-20 ENCOUNTER — Telehealth: Payer: Self-pay | Admitting: Family Medicine

## 2021-08-20 NOTE — Telephone Encounter (Signed)
Pt is trying to reach Alycia to find out if she can drop off a form to have completed for her employer/ health form/ pt wants to know if she can drop this off / and if the info from her last f/u appt is enough to complete it or if she needs another appt / paper work is time sensitive / please advise asap

## 2021-08-21 NOTE — Telephone Encounter (Signed)
Pt was called and a Vm was left informing pt to drop paperwork off to office and she will be called if appointment is needed.

## 2021-08-21 NOTE — Telephone Encounter (Signed)
See encounter

## 2021-08-22 ENCOUNTER — Telehealth: Payer: Self-pay | Admitting: Family Medicine

## 2021-08-22 NOTE — Telephone Encounter (Signed)
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

## 2021-08-22 NOTE — Telephone Encounter (Signed)
Form has been received and patient will be called once complete.

## 2021-09-04 ENCOUNTER — Encounter: Payer: Medicaid Other | Admitting: Nurse Practitioner

## 2021-09-19 ENCOUNTER — Ambulatory Visit
Admission: RE | Admit: 2021-09-19 | Discharge: 2021-09-19 | Disposition: A | Discharge status: Home / Self Care | Payer: Medicaid Other | Source: Ambulatory Visit | Attending: Family Medicine | Admitting: Family Medicine

## 2021-09-19 ENCOUNTER — Telehealth: Payer: Self-pay | Admitting: Family Medicine

## 2021-09-19 ENCOUNTER — Other Ambulatory Visit: Payer: Self-pay

## 2021-09-19 DIAGNOSIS — Z1231 Encounter for screening mammogram for malignant neoplasm of breast: Secondary | ICD-10-CM

## 2021-09-19 NOTE — Telephone Encounter (Signed)
MRIs from my experience are usually denied by the insurance company if the patient has not had a visit to support the need for MRI.  She will need to keep her appointment with Levada Dy at which time she can discuss worsening headaches and an order for an MRI can be placed and hopefully insurance company will approve it.  Has symptoms such as vomiting, blurry vision she will need to go to the ED right away for an urgent CT head.

## 2021-09-19 NOTE — Telephone Encounter (Signed)
Referral Request - Has patient seen PCP for this complaint? Yes.   *If NO, is insurance requiring patient see PCP for this issue before PCP can refer them? Referral for which specialty: imaging Preferred provider/office: any Reason for referral: pt states she has seen Dr Margarita Rana for her ongoing headaches. The headaches have been more consistent. She wants to know if Dr Margarita Rana will order a MRI of the brain, just to make sure nothing else is going on. Pt states she has had hx of stroke. Pt has appt w/ Levada Dy on 1/26 if she must wit on this. Please advise

## 2021-09-23 NOTE — Telephone Encounter (Signed)
Called pt made aware . Verbalized understanding

## 2021-09-24 ENCOUNTER — Telehealth: Payer: Self-pay | Admitting: *Deleted

## 2021-09-24 NOTE — Telephone Encounter (Signed)
Copied from [place] [id number]. Topic: General - Other >> Sep 24, 2021  3:25 PM Valere Dross wrote: Reason for CRM: Pt called in stating she was recently was using some eye drops but could not remember the name, pt requested if a nurse or PCP could reach out to her to get this figured out so she can get it refilled, please advise. (Can call after 2)  ________________________________ Patient wanted to know if prescription is ready for pick up from pharmacy.   Redirected patient to call pharmacy for this information. [phone]

## 2021-09-26 ENCOUNTER — Other Ambulatory Visit: Payer: Self-pay

## 2021-10-03 ENCOUNTER — Other Ambulatory Visit: Payer: Self-pay

## 2021-10-04 ENCOUNTER — Other Ambulatory Visit: Payer: Self-pay

## 2021-10-04 ENCOUNTER — Other Ambulatory Visit: Payer: Self-pay | Admitting: Family Medicine

## 2021-10-04 DIAGNOSIS — R29898 Other symptoms and signs involving the musculoskeletal system: Secondary | ICD-10-CM

## 2021-10-07 ENCOUNTER — Other Ambulatory Visit: Payer: Self-pay

## 2021-10-07 MED ORDER — GABAPENTIN 300 MG PO CAPS
ORAL_CAPSULE | Freq: Two times a day (BID) | ORAL | 0 refills | Status: DC
  Filled 2021-10-07 – 2021-10-14 (×2): qty 90, 45d supply, fill #0

## 2021-10-09 ENCOUNTER — Other Ambulatory Visit: Payer: Self-pay

## 2021-10-09 ENCOUNTER — Other Ambulatory Visit: Payer: Self-pay | Admitting: Cardiovascular Disease

## 2021-10-09 MED ORDER — METOPROLOL SUCCINATE ER 50 MG PO TB24
150.0000 mg | ORAL_TABLET | Freq: Every day | ORAL | 1 refills | Status: DC
  Filled 2021-10-09 – 2021-10-14 (×2): qty 90, 30d supply, fill #0
  Filled 2022-03-25: qty 90, 30d supply, fill #1

## 2021-10-10 ENCOUNTER — Other Ambulatory Visit: Payer: Self-pay

## 2021-10-10 ENCOUNTER — Ambulatory Visit: Payer: Medicaid Other | Attending: Physician Assistant | Admitting: Physician Assistant

## 2021-10-10 DIAGNOSIS — Z8673 Personal history of transient ischemic attack (TIA), and cerebral infarction without residual deficits: Secondary | ICD-10-CM

## 2021-10-10 DIAGNOSIS — E119 Type 2 diabetes mellitus without complications: Secondary | ICD-10-CM

## 2021-10-10 DIAGNOSIS — G4489 Other headache syndrome: Secondary | ICD-10-CM | POA: Diagnosis not present

## 2021-10-10 NOTE — Progress Notes (Signed)
Virtual Visit via Telephone Note  I connected with [name] on 10/10/21 at  1:50 PM EST by telephone and verified that I am speaking with the correct person using two identifiers.  Location: Patient: home Provider: my home office    I discussed the limitations, risks, security and privacy concerns of performing an evaluation and management service by telephone and the availability of in person appointments. I also discussed with the patient that there may be a patient responsible charge related to this service. The patient expressed understanding and agreed to proceed.   History of Present Illness:  patient calling with consistent HA and h/o stroke. Her HA are all over her head and she is having to take fioricet daily for the HA.  Some Nausea/no vomiting.  No recent vision changes.   She expresses frustration that her appt is with me and not Dr Margarita Rana.  Frustrated also that pharmacy did not have RF ready yesterday when she stopped by.  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 No acute infarct evident. No mass or hemorrhage.   Observations/Objective:  NAD.  A&Ox3   Assessment and Plan: 1. Other headache syndrome HA warnings explained to patient and she verbalizes understanding of reasons to seek immediate/emergency help - MR Angiogram Head Wo Contrast; Future  2. History of CVA (cerebrovascular accident) HA warnings explained to patient and she verbalizes understanding of reasons to seek immediate/emergency help - MR Angiogram Head Wo Contrast; Future  3. Type 2 diabetes mellitus without complication, without long-term current use of insulin ([place]) Not checking BP or blood sugars but she agrees to prior to next visit-last A1C=6.1 07/2021  I spoke with pharmacy and gabapentin, furosemide and metoprolol are ready for p/up  Follow Up Instructions: See DR NEWLIN only per patient  request on 6-8 weeks and Alycia-please call to schedule MRI(check notes section for PA info)   I discussed the assessment and treatment plan with the patient. The patient was provided an opportunity to ask questions and all were answered. The patient agreed with the plan and demonstrated an understanding of the instructions.   The patient was advised to call back or seek an in-person evaluation if the symptoms worsen or if the condition fails to improve as anticipated.  I provided 22 minutes of non-face-to-face time during this encounter.   Freeman Caldron, PA-C  Patient ID: [name], female   DOB: [birth year], 56 y.o.   MRN: [id number]

## 2021-10-11 ENCOUNTER — Other Ambulatory Visit: Payer: Self-pay

## 2021-10-14 ENCOUNTER — Other Ambulatory Visit: Payer: Self-pay

## 2021-10-22 ENCOUNTER — Encounter: Payer: Medicaid Other | Admitting: Nurse Practitioner

## 2021-10-24 ENCOUNTER — Other Ambulatory Visit: Payer: Self-pay

## 2021-10-31 ENCOUNTER — Other Ambulatory Visit: Payer: Self-pay

## 2021-11-01 ENCOUNTER — Other Ambulatory Visit: Payer: Self-pay

## 2021-11-04 ENCOUNTER — Other Ambulatory Visit: Payer: Self-pay

## 2021-11-05 ENCOUNTER — Other Ambulatory Visit: Payer: Self-pay

## 2021-11-11 ENCOUNTER — Other Ambulatory Visit: Payer: Self-pay

## 2021-11-18 ENCOUNTER — Ambulatory Visit: Payer: Medicaid Other

## 2021-11-21 ENCOUNTER — Other Ambulatory Visit: Payer: Self-pay

## 2021-11-21 ENCOUNTER — Emergency Department (HOSPITAL_COMMUNITY): Payer: Medicaid Other

## 2021-11-21 ENCOUNTER — Emergency Department (HOSPITAL_COMMUNITY)
Admission: EM | Admit: 2021-11-21 | Discharge: 2021-11-21 | Disposition: A | Discharge status: Home / Self Care | Payer: Medicaid Other | Attending: Emergency Medicine | Admitting: Emergency Medicine

## 2021-11-21 ENCOUNTER — Encounter (HOSPITAL_COMMUNITY): Payer: Self-pay

## 2021-11-21 DIAGNOSIS — Z23 Encounter for immunization: Secondary | ICD-10-CM | POA: Insufficient documentation

## 2021-11-21 DIAGNOSIS — R519 Headache, unspecified: Secondary | ICD-10-CM | POA: Insufficient documentation

## 2021-11-21 DIAGNOSIS — I4891 Unspecified atrial fibrillation: Secondary | ICD-10-CM | POA: Insufficient documentation

## 2021-11-21 DIAGNOSIS — S01511A Laceration without foreign body of lip, initial encounter: Secondary | ICD-10-CM | POA: Diagnosis not present

## 2021-11-21 DIAGNOSIS — S199XXA Unspecified injury of neck, initial encounter: Secondary | ICD-10-CM | POA: Diagnosis not present

## 2021-11-21 DIAGNOSIS — Z79899 Other long term (current) drug therapy: Secondary | ICD-10-CM | POA: Insufficient documentation

## 2021-11-21 DIAGNOSIS — I10 Essential (primary) hypertension: Secondary | ICD-10-CM | POA: Insufficient documentation

## 2021-11-21 DIAGNOSIS — W19XXXA Unspecified fall, initial encounter: Secondary | ICD-10-CM | POA: Diagnosis not present

## 2021-11-21 DIAGNOSIS — S80211A Abrasion, right knee, initial encounter: Secondary | ICD-10-CM | POA: Diagnosis not present

## 2021-11-21 DIAGNOSIS — S50812A Abrasion of left forearm, initial encounter: Secondary | ICD-10-CM | POA: Diagnosis not present

## 2021-11-21 DIAGNOSIS — S0993XA Unspecified injury of face, initial encounter: Secondary | ICD-10-CM | POA: Diagnosis not present

## 2021-11-21 DIAGNOSIS — R0689 Other abnormalities of breathing: Secondary | ICD-10-CM | POA: Diagnosis not present

## 2021-11-21 DIAGNOSIS — Z7901 Long term (current) use of anticoagulants: Secondary | ICD-10-CM | POA: Insufficient documentation

## 2021-11-21 DIAGNOSIS — Y92480 Sidewalk as the place of occurrence of the external cause: Secondary | ICD-10-CM | POA: Diagnosis not present

## 2021-11-21 DIAGNOSIS — W010XXA Fall on same level from slipping, tripping and stumbling without subsequent striking against object, initial encounter: Secondary | ICD-10-CM | POA: Diagnosis not present

## 2021-11-21 DIAGNOSIS — S50811A Abrasion of right forearm, initial encounter: Secondary | ICD-10-CM | POA: Insufficient documentation

## 2021-11-21 DIAGNOSIS — R220 Localized swelling, mass and lump, head: Secondary | ICD-10-CM | POA: Diagnosis not present

## 2021-11-21 DIAGNOSIS — R58 Hemorrhage, not elsewhere classified: Secondary | ICD-10-CM | POA: Diagnosis not present

## 2021-11-21 LAB — COMPREHENSIVE METABOLIC PANEL
ALT: 16 U/L (ref 0–44)
AST: 27 U/L (ref 15–41)
Albumin: 3.8 g/dL (ref 3.5–5.0)
Alkaline Phosphatase: 85 U/L (ref 38–126)
Anion gap: 11 (ref 5–15)
BUN: 13 mg/dL (ref 6–20)
CO2: 23 mmol/L (ref 22–32)
Calcium: 9.4 mg/dL (ref 8.9–10.3)
Chloride: 105 mmol/L (ref 98–111)
Creatinine, Ser: 0.92 mg/dL (ref 0.44–1.00)
GFR, Estimated: >60 mL/min (ref >60)
Glucose, Bld: 124 mg/dL — ABNORMAL HIGH (ref 70–99)
Potassium: 4.3 mmol/L (ref 3.5–5.1)
Sodium: 139 mmol/L (ref 135–145)
Total Bilirubin: 0.7 mg/dL (ref 0.3–1.2)
Total Protein: 8.3 g/dL — ABNORMAL HIGH (ref 6.5–8.1)

## 2021-11-21 LAB — CBC
HCT: 41.7 % (ref 36.0–46.0)
Hemoglobin: 12.7 g/dL (ref 12.0–15.0)
MCH: 26.5 pg (ref 26.0–34.0)
MCHC: 30.5 g/dL (ref 30.0–36.0)
MCV: 87.1 fL (ref 80.0–100.0)
Platelets: 207 10*3/uL (ref 150–400)
RBC: 4.79 MIL/uL (ref 3.87–5.11)
RDW: 14.3 % (ref 11.5–15.5)
WBC: 9.6 10*3/uL (ref 4.0–10.5)
nRBC: 0 % (ref 0.0–0.2)

## 2021-11-21 LAB — ETHANOL: Alcohol, Ethyl (B): <10 mg/dL (ref <10)

## 2021-11-21 LAB — I-STAT CHEM 8, ED
BUN: 12 mg/dL (ref 6–20)
Calcium, Ion: 1.16 mmol/L (ref 1.15–1.40)
Chloride: 106 mmol/L (ref 98–111)
Creatinine, Ser: 0.7 mg/dL (ref 0.44–1.00)
Glucose, Bld: 127 mg/dL — ABNORMAL HIGH (ref 70–99)
HCT: 44 % (ref 36.0–46.0)
Hemoglobin: 15 g/dL (ref 12.0–15.0)
Potassium: 3.4 mmol/L — ABNORMAL LOW (ref 3.5–5.1)
Sodium: 143 mmol/L (ref 135–145)
TCO2: 26 mmol/L (ref 22–32)

## 2021-11-21 LAB — CBG MONITORING, ED: Glucose-Capillary: 104 mg/dL — ABNORMAL HIGH (ref 70–99)

## 2021-11-21 LAB — PROTIME-INR
INR: 1.6 — ABNORMAL HIGH (ref 0.8–1.2)
Prothrombin Time: 18.7 seconds — ABNORMAL HIGH (ref 11.4–15.2)

## 2021-11-21 MED ORDER — HYDROCODONE-ACETAMINOPHEN 5-325 MG PO TABS
1.0000 | ORAL_TABLET | Freq: Once | ORAL | Status: AC
  Administered 2021-11-21: 18:00:00 1 via ORAL
  Filled 2021-11-21: qty 1

## 2021-11-21 MED ORDER — TETANUS-DIPHTH-ACELL PERTUSSIS 5-2.5-18.5 LF-MCG/0.5 IM SUSY
0.5000 mL | PREFILLED_SYRINGE | Freq: Once | INTRAMUSCULAR | Status: AC
  Administered 2021-11-21: 20:00:00 0.5 mL via INTRAMUSCULAR
  Filled 2021-11-21: qty 0.5

## 2021-11-21 NOTE — ED Notes (Signed)
IV start x 3 total by 2 different RN's unsuccessful. ?

## 2021-11-21 NOTE — ED Provider Notes (Signed)
[facility] EMERGENCY DEPARTMENT Provider Note   CSN: [id number] Arrival date & time: 11/21/21  1534     History  Chief Complaint  Patient presents with   Facial Injury    On Nash is a 56 y.o. female.  HPI  56 year old female with past medical history of atrial fibrillation anticoagulated on Xarelto, HTN, CVA presents to the emergency department with mechanical fall and facial injury.  Patient states that she missed a step and fell forward onto her right knee, hands and face.  She sustained a lip abrasion, and her lip laceration.  No loss of consciousness.  No syncope.  Currently she is complaining of pain from the lip with a mild headache.  Denies any specific neck, back or extremity pain.  She is superficial abrasions.  No active bleeding.  She is been compliant with her Xarelto, last dose was last night.  She otherwise been in her baseline health.  Home Medications Prior to Admission medications   Medication Sig Start Date End Date Taking? Authorizing Provider  Accu-Chek FastClix Lancets MISC USE AS INSTRUCTED TO CHECK BLOOD SUGAR ONCE DAILY. 06/14/21   Fenton Foy, NP  acetaminophen (TYLENOL) 500 MG tablet Take 1,000 mg by mouth every 6 (six) hours as needed for headache.     [provider]  atorvastatin (LIPITOR) 80 MG tablet Take 1 tablet (80 mg total) by mouth daily at 6 PM. 05/24/21   Croitoru, Mihai, MD  Blood Glucose Monitoring Suppl (ACCU-CHEK GUIDE ME) w/Device KIT 1 kit by Does not apply route daily. 05/02/19   [name], MD  butalbital-acetaminophen-caffeine (FIORICET) 50-325-40 MG tablet TAKE 1 TABLET BY MOUTH EVERY 12 HOURS AS NEEDED. 06/17/21 06/17/22  [name], MD  cetirizine (ZYRTEC) 10 MG tablet Take 1 tablet (10 mg total) by mouth daily. 07/16/21   [name], MD  Continuous Blood Gluc Receiver (FREESTYLE LIBRE 2 READER) DEVI Use to check blood sugar 3 times daily 07/17/21   [name], MD   Continuous Blood Gluc Sensor (FREESTYLE LIBRE 2 SENSOR) MISC Use to check blood sugar 3 times daily 07/17/21   [name], MD  dapagliflozin propanediol (FARXIGA) 10 MG TABS tablet Take 1 tablet (10 mg total) by mouth daily. 07/10/21   Croitoru, Mihai, MD  digoxin (LANOXIN) 0.25 MG tablet Take 1 tablet (0.25 mg total) by mouth daily. 07/10/21   Croitoru, Mihai, MD  furosemide (LASIX) 20 MG tablet Take 2 tablets (40 mg total) by mouth daily. 07/16/21   [name], MD  gabapentin (NEURONTIN) 300 MG capsule TAKE 1 CAPSULE (300 MG TOTAL) BY MOUTH 2 (TWO) TIMES DAILY. 10/07/21 10/07/22  [name], MD  glucose blood (ACCU-CHEK GUIDE) test strip Use as instructed 06/14/21   Fenton Foy, NP  losartan (COZAAR) 25 MG tablet Take 0.5 tablets (12.5 mg total) by mouth daily. 07/10/21 10/08/21  Croitoru, Mihai, MD  metoprolol succinate (TOPROL-XL) 100 MG 24 hr tablet Take 1 tablet (100 mg total) by mouth daily. Take with or immediately following a meal. 05/24/21 08/22/21  Croitoru, Dani Gobble, MD  metoprolol succinate (TOPROL-XL) 50 MG 24 hr tablet Take 1 tablet (50 mg total) by mouth daily. Take with or immediately following a meal. 05/24/21 08/22/21  Croitoru, Dani Gobble, MD  metoprolol succinate (TOPROL-XL) 50 MG 24 hr tablet Take 3 tablets (150 mg total) by mouth daily. 10/09/21   Croitoru, Dani Gobble, MD  Misc. Devices MISC Blood pressure monitor.  Diagnosis Hypertension 02/11/19  [name], MD  rivaroxaban (XARELTO) 20 MG TABS tablet TAKE 1 TABLET (20 MG TOTAL) BY MOUTH DAILY WITH SUPPER. 07/10/21 07/10/22  Troy Sine, MD  spironolactone (ALDACTONE) 25 MG tablet Take 0.5 tablets (12.5 mg total) by mouth daily. 07/10/21   Croitoru, Mihai, MD  albuterol (PROVENTIL HFA;VENTOLIN HFA) 108 (90 Base) MCG/ACT inhaler Inhale 2 puffs into the lungs every 6 (six) hours as needed for wheezing or shortness of breath. Patient not taking: Reported on 03/28/2021 11/17/18 03/28/21  [name], MD  fluticasone (FLONASE)  50 MCG/ACT nasal spray Place 2 sprays into both nostrils daily. Patient not taking: Reported on 03/28/2021 12/14/18 03/28/21  [name] Stain, MD      Allergies    Penicillins    Review of Systems   Review of Systems  Constitutional:  Negative for fever.  HENT:  Negative for trouble swallowing.   Respiratory:  Negative for shortness of breath.   Cardiovascular:  Negative for chest pain.  Gastrointestinal:  Negative for abdominal pain, diarrhea and vomiting.  Skin:  Positive for wound. Negative for rash.  Neurological:  Positive for headaches.   Physical Exam Updated Vital Signs BP (!) 155/85    Pulse (!) 107    Temp 98.2 F (36.8 C) (Oral)    Resp (!) 24    Ht _0  (1.626 m)    Wt 90.7 kg    SpO2 97%    BMI 34.33 kg/m  Physical Exam Vitals and nursing note reviewed.  Constitutional:      Appearance: Normal appearance.  HENT:     Head: Normocephalic.     Mouth/Throat:     Mouth: Mucous membranes are moist.     Comments: Abrasion to upper lip, mucosal puncture laceration to the lower lip that is less than 1 cm, no active bleeding, dentition is stable, tongue is unremarkable, jaw alignment appears normal Eyes:     Pupils: Pupils are equal, round, and reactive to light.  Cardiovascular:     Rate and Rhythm: Normal rate.  Pulmonary:     Effort: Pulmonary effort is normal. No respiratory distress.  Abdominal:     Palpations: Abdomen is soft.     Tenderness: There is no abdominal tenderness.  Musculoskeletal:     Cervical back: Tenderness present. No rigidity.     Comments: Abrasion to forearms and knees without any bony tenderness  Skin:    General: Skin is warm.  Neurological:     Mental Status: She is alert and oriented to person, place, and time. Mental status is at baseline.  Psychiatric:        Mood and Affect: Mood normal.    ED Results / Procedures / Treatments   Labs (all labs ordered are listed, but only abnormal results are displayed) Labs Reviewed   COMPREHENSIVE METABOLIC PANEL - Abnormal; Notable for the following components:      Result Value   Glucose, Bld 124 (*)    Total Protein 8.3 (*)    All other components within normal limits  PROTIME-INR - Abnormal; Notable for the following components:   Prothrombin Time 18.7 (*)    INR 1.6 (*)    All other components within normal limits  I-STAT CHEM 8, ED - Abnormal; Notable for the following components:   Potassium 3.4 (*)    Glucose, Bld 127 (*)    All other components within normal limits  ETHANOL  CBC  URINALYSIS, ROUTINE W REFLEX MICROSCOPIC    EKG None  Radiology CT Head Wo Contrast  Result Date: 11/21/2021 CLINICAL DATA:  Trauma, fall. EXAM: CT HEAD WITHOUT CONTRAST CT MAXILLOFACIAL WITHOUT CONTRAST CT CERVICAL SPINE WITHOUT CONTRAST TECHNIQUE: Multidetector CT imaging of the head, cervical spine, and maxillofacial structures were performed using the standard protocol without intravenous contrast. Multiplanar CT image reconstructions of the cervical spine and maxillofacial structures were also generated. RADIATION DOSE REDUCTION: This exam was performed according to the departmental dose-optimization program which includes automated exposure control, adjustment of the mA and/or kV according to patient size and/or use of iterative reconstruction technique. COMPARISON:  Head CT 02/20/2021. FINDINGS: CT HEAD FINDINGS Brain: Encephalomalacia in the right parietal temporal region is unchanged compatible with old infarct. No acute infarct identified. No acute intracranial hemorrhage, mass effect or midline shift. No subdural fluid collection. Brain volume is age-appropriate. Ventricles are normal in size. Vascular: Atherosclerotic calcifications are present within the cavernous internal carotid arteries. Skull: Normal. Negative for fracture or focal lesion. Other: None. CT MAXILLOFACIAL FINDINGS Osseous: No fracture or mandibular dislocation. No destructive process. Orbits: Negative. No  traumatic or inflammatory finding. Sinuses: Clear. Soft tissues: There is soft tissue swelling and edema of the lower lip and overlying the anterior mandible. There is no evidence for soft tissue gas. There is a punctate density within the soft tissues of the lower lip just right of midline measuring 2 x 3 mm. CT CERVICAL SPINE FINDINGS Alignment: Normal. Skull base and vertebrae: No acute fracture. No primary bone lesion or focal pathologic process. Soft tissues and spinal canal: No prevertebral fluid or swelling. No visible canal hematoma. Soft tissue prominence of the adenoids present. Disc levels: Mild disc space narrowing at C4-C5 with endplate osteophyte formation. 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 Upper chest: Negative. Other: None. IMPRESSION: 1. No acute intracranial hemorrhage. 2. No acute cervical spine fracture. 3. Soft tissue swelling and edema overlying the chin and lower lip. Questionable punctate foreign body in the lower lip. 4. No acute facial fracture. 5. Stable right temporoparietal lobe encephalomalacia likely related to old infarct. Electronically Signed   By: Ronney Asters M.D.   On: 11/21/2021 18:10   CT Cervical Spine Wo Contrast  Result Date: 11/21/2021 CLINICAL DATA:  Trauma, fall. EXAM: CT HEAD WITHOUT CONTRAST CT MAXILLOFACIAL WITHOUT CONTRAST CT CERVICAL SPINE WITHOUT CONTRAST TECHNIQUE: Multidetector CT imaging of the head, cervical spine, and maxillofacial structures were performed using the standard protocol without intravenous contrast. Multiplanar CT image reconstructions of the cervical spine and maxillofacial structures were also generated. RADIATION DOSE REDUCTION: This exam was performed according to the departmental dose-optimization program which includes automated exposure control, adjustment of the mA and/or kV according to patient size and/or use of iterative reconstruction  technique. COMPARISON:  Head CT 02/20/2021. FINDINGS: CT HEAD FINDINGS Brain: Encephalomalacia in the right parietal temporal region is unchanged compatible with old infarct. No acute infarct identified. No acute intracranial hemorrhage, mass effect or midline shift. No subdural fluid collection. Brain volume is age-appropriate. Ventricles are normal in size. Vascular: Atherosclerotic calcifications are present within the cavernous internal carotid arteries. Skull: Normal. Negative for fracture or focal lesion. Other: None. CT MAXILLOFACIAL FINDINGS Osseous: No fracture or mandibular dislocation. No destructive process. Orbits: Negative. No traumatic or inflammatory finding. Sinuses: Clear. Soft tissues: There is soft tissue swelling and edema of the lower lip and overlying the anterior mandible. There is no evidence for soft tissue gas. There is a punctate density  within the soft tissues of the lower lip just right of midline measuring 2 x 3 mm. CT CERVICAL SPINE FINDINGS Alignment: Normal. Skull base and vertebrae: No acute fracture. No primary bone lesion or focal pathologic process. Soft tissues and spinal canal: No prevertebral fluid or swelling. No visible canal hematoma. Soft tissue prominence of the adenoids present. Disc levels: Mild disc space narrowing at C4-C5 with endplate osteophyte formation. 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 Upper chest: Negative. Other: None. IMPRESSION: 1. No acute intracranial hemorrhage. 2. No acute cervical spine fracture. 3. Soft tissue swelling and edema overlying the chin and lower lip. Questionable punctate foreign body in the lower lip. 4. No acute facial fracture. 5. Stable right temporoparietal lobe encephalomalacia likely related to old infarct. Electronically Signed   By: Ronney Asters M.D.   On: 11/21/2021 18:10   CT Maxillofacial WO CM  Result Date: 11/21/2021 CLINICAL  DATA:  Trauma, fall. EXAM: CT HEAD WITHOUT CONTRAST CT MAXILLOFACIAL WITHOUT CONTRAST CT CERVICAL SPINE WITHOUT CONTRAST TECHNIQUE: Multidetector CT imaging of the head, cervical spine, and maxillofacial structures were performed using the standard protocol without intravenous contrast. Multiplanar CT image reconstructions of the cervical spine and maxillofacial structures were also generated. RADIATION DOSE REDUCTION: This exam was performed according to the departmental dose-optimization program which includes automated exposure control, adjustment of the mA and/or kV according to patient size and/or use of iterative reconstruction technique. COMPARISON:  Head CT 02/20/2021. FINDINGS: CT HEAD FINDINGS Brain: Encephalomalacia in the right parietal temporal region is unchanged compatible with old infarct. No acute infarct identified. No acute intracranial hemorrhage, mass effect or midline shift. No subdural fluid collection. Brain volume is age-appropriate. Ventricles are normal in size. Vascular: Atherosclerotic calcifications are present within the cavernous internal carotid arteries. Skull: Normal. Negative for fracture or focal lesion. Other: None. CT MAXILLOFACIAL FINDINGS Osseous: No fracture or mandibular dislocation. No destructive process. Orbits: Negative. No traumatic or inflammatory finding. Sinuses: Clear. Soft tissues: There is soft tissue swelling and edema of the lower lip and overlying the anterior mandible. There is no evidence for soft tissue gas. There is a punctate density within the soft tissues of the lower lip just right of midline measuring 2 x 3 mm. CT CERVICAL SPINE FINDINGS Alignment: Normal. Skull base and vertebrae: No acute fracture. No primary bone lesion or focal pathologic process. Soft tissues and spinal canal: No prevertebral fluid or swelling. No visible canal hematoma. Soft tissue prominence of the adenoids present. Disc levels: Mild disc space narrowing at C4-C5 with endplate  osteophyte formation. 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 Upper chest: Negative. Other: None. IMPRESSION: 1. No acute intracranial hemorrhage. 2. No acute cervical spine fracture. 3. Soft tissue swelling and edema overlying the chin and lower lip. Questionable punctate foreign body in the lower lip. 4. No acute facial fracture. 5. Stable right temporoparietal lobe encephalomalacia likely related to old infarct. Electronically Signed   By: Ronney Asters M.D.   On: 11/21/2021 18:10    Procedures Procedures    Medications Ordered in ED Medications  HYDROcodone-acetaminophen (NORCO/VICODIN) 5-325 MG per tablet 1 tablet (1 tablet Oral Given 11/21/21 1808)    ED Course/ Medical Decision Making/ A&P  Medical Decision Making Amount and/or Complexity of Data Reviewed Labs: ordered. Radiology: ordered. ECG/medicine tests: ordered.  Risk Prescription drug management.   This patient presents to the ED for concern of fall, head injury, this involves an extensive number of treatment options, and is a complaint that carries with it a high risk of complications and morbidity.  The differential diagnosis includes ICH, traumatic injury, fracture, laceration, mechanical fall   Additional history obtained: -Additional history obtained from daughter at bedside -External records from outside source obtained and reviewed including: Chart review including previous notes, labs, imaging, consultation notes   Lab Tests: -I ordered, reviewed, and interpreted labs.  The pertinent results include: Baseline lab findings   Imaging Studies ordered: -I ordered imaging studies including CT head, CT face, CT cervical spine -I independently visualized and interpreted imaging which showed no acute traumatic fracture/bleed -I agree with the radiologist interpretation   Medicines ordered and  prescription drug management: -I ordered medication including pain medicine for symptoms -Reevaluation of the patient after these medicines showed that the patient improved -I have reviewed the patients home medicines and have made adjustments as needed   ED Course: 56 year old female presents to the emergency department with concern for mechanical fall and facial injury.  Patient has a small mucosal lip laceration, less than 1 cm, no active bleeding, it is not through and through.  Dentition is intact with maybe a small chip on the right upper front tooth.  Trauma imaging is negative.  Blood work is baseline.  Lip laceration does not require acute repair.  CT showed a possible high density object in the wound, potentially chipped tooth.  The wound was irrigated copiously.  We will treat this conservatively as an outpatient.  Tetanus was updated.   Cardiac Monitoring: The patient was maintained on a cardiac monitor.  I personally viewed and interpreted the cardiac monitored which showed an underlying rhythm of: Atrial fibrillation   Reevaluation: After the interventions noted above, I reevaluated the patient and found that they have :improved   Dispostion: Patient at this time appears safe and stable for discharge and close outpatient follow up. Discharge plan and strict return to ED precautions discussed, patient verbalizes understanding and agreement.        Final Clinical Impression(s) / ED Diagnoses Final diagnoses:  None    Rx / DC Orders ED Discharge Orders     None         Lorelle Gibbs, DO 11/21/21 2019

## 2021-11-21 NOTE — ED Notes (Signed)
ED Provider at bedside. 

## 2021-11-21 NOTE — ED Triage Notes (Signed)
Pt arrived to ED via EMS after pt tripped and fell on the sidewalk and fell prone w/ impact to face, knees arms. Pt is on xarelto. Pt w/ upper lip abrasions, lower lip puncture wound, abrasions to R knee and L FA. Pt denies LOC. Main complaint is mouth pain. EMS VS: 200/110, HR 100 irregular (A-fib hx), RR 20, 98% RA. ?

## 2021-11-21 NOTE — Discharge Instructions (Signed)
You have been seen and discharged from the emergency department.  Your CT imaging was normal with no findings of acute traumatic injury besides lip laceration.  This lip laceration is small enough that does not require suture repair.  After every meal we recommend salt water rinse/irrigation with saline to keep food particles out of the wound and aid in healing.  Spicy/acidic foods may cause discomfort in regards to the lip laceration.  Your tetanus was updated today.  Follow-up with your primary provider for further evaluation and further care. Take home medications as prescribed. If you have any worsening symptoms or further concerns for your health please return to an emergency department for further evaluation. ?

## 2021-11-21 NOTE — ED Notes (Signed)
RN reviewed discharge instructions with pt. Pt verbalized understanding and had no further questions. VSS upon discharge.  

## 2021-11-22 ENCOUNTER — Telehealth: Payer: Self-pay

## 2021-11-22 ENCOUNTER — Ambulatory Visit: Payer: Medicaid Other | Admitting: Cardiovascular Disease

## 2021-11-22 NOTE — Telephone Encounter (Signed)
Transition Care Management Follow-up Telephone Call ?Date of discharge and from where: 11/21/2021 from [facility] ?How have you been since you were released from the hospital? Patient stated that she is feeling okay and did not have any questions or concerns at this time.  ?Any questions or concerns? No ? ?Items Reviewed: ?Did the pt receive and understand the discharge instructions provided? Yes  ?Medications obtained and verified? Yes  ?Other? No  ?Any new allergies since your discharge? No  ?Dietary orders reviewed? No ?Do you have support at home? Yes  ? ?Functional Questionnaire: (I = Independent and D = Dependent) ?ADLs: I ? ?Bathing/Dressing- I ? ?Meal Prep- I ? ?Eating- I ? ?Maintaining continence- I ? ?Transferring/Ambulation- I ? ?Managing Meds- I ? ? ?Follow up appointments reviewed: ? ?PCP Hospital f/u appt confirmed? Yes  Scheduled to see [name], MD on 12/02/2021 @ 02:50pm. ?[facility] f/u appt confirmed? No   ?Are transportation arrangements needed? No  ?If their condition worsens, is the pt aware to call PCP or go to the Emergency Dept.? Yes ?Was the patient provided with contact information for the PCP's office or ED? Yes ?Was to pt encouraged to call back with questions or concerns? Yes ? ?

## 2021-12-02 ENCOUNTER — Ambulatory Visit: Payer: Medicaid Other | Admitting: Family Medicine

## 2021-12-12 LAB — HM DIABETES EYE EXAM

## 2022-03-11 ENCOUNTER — Other Ambulatory Visit: Payer: Self-pay | Admitting: Pharmacist

## 2022-03-12 ENCOUNTER — Other Ambulatory Visit: Payer: Self-pay | Admitting: Pharmacist

## 2022-03-12 DIAGNOSIS — E119 Type 2 diabetes mellitus without complications: Secondary | ICD-10-CM

## 2022-03-12 DIAGNOSIS — I2510 Atherosclerotic heart disease of native coronary artery without angina pectoris: Secondary | ICD-10-CM

## 2022-03-12 NOTE — Chronic Care Management (AMB) (Signed)
Chief Complaint  Patient presents with   High Risk Managed Medicaid    [name] is a 56 y.o. year old female who presented for a telephone visit.   They were referred to the pharmacist by a quality report for assistance in managing diabetes, hypertension, and hyperlipidemia.   Patient is participating in a Managed Medicaid Plan:  Yes  Subjective:  Care Team: Primary Care Provider: Hoy Register, MD ; Next Scheduled Visit: not scheduled Cardiologist: Croituru; Next Scheduled Visit: not scheduled  Medication Access/Adherence  Current Pharmacy:  [facility] [address], [address] [place] [address] Phone: [phone] Fax: [phone]   Patient reports affordability concerns with their medications: No  Patient reports access/transportation concerns to their pharmacy: No  Patient reports adherence concerns with their medications:  Yes  - reports she is inconsistent with taking her medications because she doesn't feel any different when she skips medications, besides the metoprolol and digoxin.   Hyperlipidemia/ASCVD Risk Reduction  Current lipid lowering medications: atorvastatin 80 mg - has not been taking regularly lately  Antiplatelet regimen: none, on DOAC   Atrial Fibrillation:  Current medications: Rate Control: metoprolol succinate 150 mg daily Rhythm Control: digoxin 0.25 mg daily Anticoagulation Regimen: Xarelto 20 mg daily  Heart Failure:  Current medications:  ACEi/ARB/ARNI: losartan 12.5 mg daily - not taking regularly SGLT2i: Farxiga 10 mg - not taking regularly Beta blocker: metoprolol succinate XL 150 mg daily  Mineralocorticoid Receptor Antagonist: spironolactone 12.5 mg daily - not taking lately Diuretic regimen: furosemide 40 mg daily - not taking lately   Health Maintenance  Health Maintenance Due  Topic Date Due   FOOT EXAM  Never done   Zoster Vaccines- Shingrix (1 of 2)  Never done   COVID-19 Vaccine (3 - Pfizer series) 02/01/2020   URINE MICROALBUMIN  10/20/2020   HEMOGLOBIN A1C  01/13/2022     Objective: Lab Results  Component Value Date   HGBA1C 6.1 07/16/2021    Lab Results  Component Value Date   CREATININE 0.70 11/21/2021   BUN 12 11/21/2021   NA 143 11/21/2021   K 3.4 (L) 11/21/2021   CL 106 11/21/2021   CO2 23 11/21/2021    Lab Results  Component Value Date   CHOL 111 03/29/2021   HDL 18 (L) 03/29/2021   LDLCALC 79 03/29/2021   TRIG 71 03/29/2021   CHOLHDL 6.2 03/29/2021    Medications Reviewed Today     Reviewed by Alden Hipp, RPH-CPP (Pharmacist) on 03/12/22 at 1017  Med List Status: <None>   Medication Order Taking? Sig Documenting Provider Last Dose Status Informant  Accu-Chek FastClix Lancets MISC [id number]  USE AS INSTRUCTED TO CHECK BLOOD SUGAR ONCE DAILY. Ivonne Andrew, NP  Active   acetaminophen (TYLENOL) 500 MG tablet [id number]  Take 1,000 mg by mouth every 6 (six) hours as needed for headache.  [provider]  Active Self   Patient not taking:   Discontinued 03/28/21 1020 atorvastatin (LIPITOR) 80 MG tablet [id number] No Take 1 tablet (80 mg total) by mouth daily at 6 PM.  Patient not taking: Reported on 03/12/2022   Croitoru, Rachelle Hora, MD Not Taking Active   Blood Glucose Monitoring Suppl (ACCU-CHEK GUIDE ME) w/Device KIT [id number]  1 kit by Does not apply route daily. Hoy Register, MD  Active Self  butalbital-acetaminophen-caffeine (FIORICET) 50-325-40 MG tablet [id number]  TAKE 1 TABLET BY MOUTH EVERY 12 HOURS AS NEEDED. Hoy Register, MD  Active   cetirizine (ZYRTEC) 10 MG tablet [id number]  Take 1 tablet (10 mg total) by mouth daily. [name], MD  Active   dapagliflozin propanediol (FARXIGA) 10 MG TABS tablet [id number] No Take 1 tablet (10 mg total) by mouth daily.  Patient not taking: Reported on 03/12/2022   Croitoru, Dani Gobble, MD Not Taking Active   digoxin (LANOXIN) 0.25 MG tablet  [id number] Yes Take 1 tablet (0.25 mg total) by mouth daily. Croitoru, Dani Gobble, MD Taking Active    Patient not taking:   Discontinued 03/28/21 1020 furosemide (LASIX) 20 MG tablet [id number] Yes Take 2 tablets (40 mg total) by mouth daily. [name], MD Taking Active   gabapentin (NEURONTIN) 300 MG capsule [id number]  TAKE 1 CAPSULE (300 MG TOTAL) BY MOUTH 2 (TWO) TIMES DAILY. [name], MD  Active   glucose blood (ACCU-CHEK GUIDE) test strip [id number]  Use as instructed Fenton Foy, NP  Active   losartan (COZAAR) 25 MG tablet [id number] No Take 0.5 tablets (12.5 mg total) by mouth daily.  Patient not taking: Reported on 03/12/2022   Sanda Klein, MD Not Taking Expired 10/08/21 2359   metoprolol succinate (TOPROL-XL) 50 MG 24 hr tablet [id number] Yes Take 3 tablets (150 mg total) by mouth daily. Croitoru, Dani Gobble, MD Taking Active   Misc. Devices MISC [id number]  Blood pressure monitor.  Diagnosis Hypertension [name], MD  Active Self  rivaroxaban (XARELTO) 20 MG TABS tablet [id number] Yes TAKE 1 TABLET (20 MG TOTAL) BY MOUTH DAILY WITH SUPPER. Troy Sine, MD Taking Active   spironolactone (ALDACTONE) 25 MG tablet [id number] No Take 0.5 tablets (12.5 mg total) by mouth daily.  Patient not taking: Reported on 03/12/2022   Croitoru, Dani Gobble, MD Not Taking Active               Assessment/Plan:   Hyperlipidemia/ASCVD Risk Reduction: - Currently uncontrolled due to nonadherence - Discussed indication for statin therapy and LDL goal. Patient will restart atorvastatin  Heart Failure: - Currently opportunity for optimization - Reviewed appropriate blood pressure monitoring technique and reviewed goal blood pressure - Reviewed importance of each HF medication, indication, impact. Encouraged adherence to all prescribed medications to improve cardiac function.  - Recommend to restart Farxiga, losartan, spironolactone, continue metoprolol and digoxin  Atrial Fibrillation: -  Currently controlled - Reviewed importance of adherence to anticoagulant for stroke prevention. - Reviewed appropriate blood pressure monitoring technique and reviewed goal blood pressure. Recommended to check home blood pressure and heart rate daily - Recommend to continue digoxin, metoprolol, and Xarelto. Patient requests refills. Message sent to Dr. Sallyanne Kuster and patient will call to schedule follow up  Follow Up Plan: phone call in 2 weeks  Catie TJodi Mourning, PharmD, [facility] [phone]

## 2022-03-12 NOTE — Patient Instructions (Addendum)
[name],   It was great talking to you!  Here is what each medication is for:  Morning: - Farxiga 10 mg - heart failure/diabetes - spironolactone 25 mg - heart failure - metoprolol succiante 150 mg - heart failure/atrial fibrillation - digoxin 0.25 mg - atrial fibrillation/heart failure - losartan 12.5 mg daily - heart failure  Evening: - atorvastatin 80 mg - cholesterol/heart attack and stroke prevention - Xaretlo 20 mg - stroke prevention (related to atrial fibrillation)  Our A1c goal is definitely to stay less than 7%, but even better if you can keep it less than 6.5% in the "prediabetes" range.   Our LDL (bad cholesterol) goal to reduce the risk of heart attacks/strokes is less than 70.   Check your blood pressure once daily, and any time you have concerning symptoms like headache, chest pain, dizziness, shortness of breath, or vision changes.   Our goal is less than 130/80.  To appropriately check your blood pressure, make sure you do the following:  1) Avoid caffeine, exercise, or tobacco products for 30 minutes before checking. Empty your bladder. 2) Sit with your back supported in a flat-backed chair. Rest your arm on something flat (arm of the chair, table, etc). 3) Sit still with your feet flat on the floor, resting, for at least 5 minutes.  4) Check your blood pressure. Take 1-2 readings.  5) Write down these readings and bring with you to any provider appointments.  Bring your home blood pressure machine with you to a provider's office for accuracy comparison at least once a year.   Make sure you take your blood pressure medications before you come to any office visit, even if you were asked to fast for labs.  I've messaged [name] regarding refills on digoxin and Xarelto. Please call his office and [name] office to schedule follow up.    Thanks so much, and please call with any questions!  [name], PharmD, [facility] [phone]

## 2022-03-25 ENCOUNTER — Other Ambulatory Visit: Payer: Self-pay | Admitting: Cardiovascular Disease

## 2022-03-25 ENCOUNTER — Other Ambulatory Visit: Payer: Self-pay

## 2022-03-25 DIAGNOSIS — I482 Chronic atrial fibrillation, unspecified: Secondary | ICD-10-CM

## 2022-03-25 MED ORDER — RIVAROXABAN 20 MG PO TABS
ORAL_TABLET | Freq: Every day | ORAL | 1 refills | Status: DC
  Filled 2022-03-25: qty 90, 90d supply, fill #0
  Filled 2022-06-26 – 2022-08-19 (×4): qty 90, 90d supply, fill #1

## 2022-03-25 NOTE — Telephone Encounter (Signed)
Pt last saw Dr Sallyanne Kuster 07/19/21, last labs 0.92, age 56, weight 90.7kg, CrCl 96.6, based on CrCl pt is on appropriate dosage of Xarelto '20mg'$  QD for afib.  Will refill rx.

## 2022-03-31 ENCOUNTER — Other Ambulatory Visit (HOSPITAL_COMMUNITY): Payer: Self-pay

## 2022-03-31 ENCOUNTER — Other Ambulatory Visit: Payer: Self-pay

## 2022-04-01 ENCOUNTER — Other Ambulatory Visit (HOSPITAL_COMMUNITY): Payer: Self-pay

## 2022-04-02 ENCOUNTER — Other Ambulatory Visit: Payer: Self-pay

## 2022-04-02 ENCOUNTER — Other Ambulatory Visit (HOSPITAL_COMMUNITY): Payer: Self-pay

## 2022-04-02 ENCOUNTER — Other Ambulatory Visit: Payer: Self-pay | Admitting: Pharmacist

## 2022-04-02 NOTE — Chronic Care Management (AMB) (Signed)
Chief Complaint  Patient presents with   Hypertension    [name] is a 56 y.o. year old female who presented for a telephone visit.   They were referred to the pharmacist by a quality report for assistance in managing hypertension.   Patient is participating in a Managed Medicaid Plan:  Yes  Subjective:  Care Team: Primary Care Provider: [name], MD ; Next Scheduled Visit: 04/21/22 Cardiologist: Sarajane Jews; Next Scheduled Visit: 05/01/22  Medication Access/Adherence  Current Pharmacy:  [facility], [place] [address] Phone: [phone] Fax: [phone]   Patient reports affordability concerns with their medications: No  Patient reports access/transportation concerns to their pharmacy: No  Patient reports adherence concerns with their medications:  No  reports she plans to pick up a digoxin refill later today/tomorrow   Hyperlipidemia/ASCVD Risk Reduction   Current lipid lowering medications: atorvastatin 80 mg    Antiplatelet regimen: none, on DOAC  Denies any side effects since restarting statin     Atrial Fibrillation:   Current medications: Rate Control: metoprolol succinate 150 mg daily Rhythm Control: digoxin 0.25 mg daily Anticoagulation Regimen: Xarelto 20 mg daily  Patient has a validated home blood pressure machine, but has not been checking any readings lately.   Heart Failure:   Current medications:  ACEi/ARB/ARNI: losartan 12.5 mg daily SGLT2i: Farxiga 10 m Beta blocker: metoprolol succinate XL 150 mg daily  Mineralocorticoid Receptor Antagonist: spironolactone 12.5 mg daily Diuretic regimen: furosemide 40 mg daily - using ~ every 2-3 days  Reports overall feeling well back on prescribed medication regimen. Very little lower extremity edema. Denies dizziness, lightheadedness.     Health Maintenance  Health Maintenance Due  Topic Date Due   FOOT  EXAM  Never done   Zoster Vaccines- Shingrix (1 of 2) Never done   COVID-19 Vaccine (3 - Pfizer series) 02/01/2020   URINE MICROALBUMIN  10/20/2020   HEMOGLOBIN A1C  01/13/2022     Objective: Lab Results  Component Value Date   HGBA1C 6.1 07/16/2021    Lab Results  Component Value Date   CREATININE 0.70 11/21/2021   BUN 12 11/21/2021   NA 143 11/21/2021   K 3.4 (L) 11/21/2021   CL 106 11/21/2021   CO2 23 11/21/2021    Lab Results  Component Value Date   CHOL 111 03/29/2021   HDL 18 (L) 03/29/2021   LDLCALC 79 03/29/2021   TRIG 71 03/29/2021   CHOLHDL 6.2 03/29/2021    Medications Reviewed Today     Reviewed by Osker Mason, RPH-CPP (Pharmacist) on 04/02/22 at 7  Med List Status: <None>   Medication Order Taking? Sig Documenting Provider Last Dose Status Informant  Accu-Chek FastClix Lancets MISC [id number]  USE AS INSTRUCTED TO CHECK BLOOD SUGAR ONCE DAILY. Fenton Foy, NP  Active   acetaminophen (TYLENOL) 500 MG tablet [id number]  Take 1,000 mg by mouth every 6 (six) hours as needed for headache.  [provider]  Active Self   Patient not taking:   Discontinued 03/28/21 1020 atorvastatin (LIPITOR) 80 MG tablet [id number] Yes Take 1 tablet (80 mg total) by mouth daily at 6 PM. Croitoru, Mihai, MD Taking Active   Blood Glucose Monitoring Suppl (ACCU-CHEK GUIDE ME) w/Device KIT [id number]  1 kit by Does not apply route daily.  Patient not taking: Reported on 03/12/2022   [name], MD  Active Self  butalbital-acetaminophen-caffeine (FIORICET) 50-325-40 MG tablet [id number] No  TAKE 1 TABLET BY MOUTH EVERY 12 HOURS AS NEEDED.  Patient not taking: Reported on 03/12/2022   [name], MD Not Taking Active   dapagliflozin propanediol (FARXIGA) 10 MG TABS tablet [id number] Yes Take 1 tablet by mouth daily. Need office visit for refills. Croitoru, Mihai, MD Taking Active   digoxin (LANOXIN) 0.25 MG tablet [id number] Yes Take 1 tablet (0.25 mg total) by  mouth daily. Croitoru, Dani Gobble, MD Taking Active    Patient not taking:   Discontinued 03/28/21 1020 furosemide (LASIX) 20 MG tablet [id number] Yes Take 2 tablets (40 mg total) by mouth daily. [name], MD Taking Active   gabapentin (NEURONTIN) 300 MG capsule [id number] Yes TAKE 1 CAPSULE (300 MG TOTAL) BY MOUTH 2 (TWO) TIMES DAILY. [name], MD Taking Active   glucose blood (ACCU-CHEK GUIDE) test strip [id number]  Use as instructed  Patient not taking: Reported on 03/12/2022   Fenton Foy, NP  Active   losartan (COZAAR) 25 MG tablet [id number] Yes Take 1/2 tablet (12.5 mg total) by mouth daily. Croitoru, Mihai, MD Taking Active   metoprolol succinate (TOPROL-XL) 50 MG 24 hr tablet [id number] Yes Take 3 tablets (150 mg total) by mouth daily. Croitoru, Dani Gobble, MD Taking Active   Misc. Devices MISC [id number]  Blood pressure monitor.  Diagnosis Hypertension [name], MD  Active Self  rivaroxaban (XARELTO) 20 MG TABS tablet [id number] Yes TAKE 1 TABLET (20 MG TOTAL) BY MOUTH DAILY WITH SUPPER. Croitoru, Mihai, MD Taking Active   spironolactone (ALDACTONE) 25 MG tablet [id number] Yes Take 0.5 tablets (12.5 mg total) by mouth daily. Croitoru, Mihai, MD Taking Active               Assessment/Plan:   Hyperlipidemia/ASCVD Risk Reduction: - Currently uncontrolled but anticipated improvement on next lipid panel - Recommend to continue current regimen at this time.   Heart Failure/Atrial Fibrillation - Currently appropriately managed - Reviewed appropriate blood pressure monitoring technique and reviewed goal blood pressure - Recommend to continue current regimen at this time. Discussed checking and documenting home BP readings prior to next appointments.    Follow Up Plan: phone call in ~ 6 weeks  Catie TJodi Mourning, PharmD, [facility] [phone]

## 2022-04-02 NOTE — Patient Instructions (Signed)
[name],   Keep up the great work!  Check your blood pressure once daily, and any time you have concerning symptoms like headache, chest pain, dizziness, shortness of breath, or vision changes.   Our goal is less than 130/80.  To appropriately check your blood pressure, make sure you do the following:  1) Avoid caffeine, exercise, or tobacco products for 30 minutes before checking. Empty your bladder. 2) Sit with your back supported in a flat-backed chair. Rest your arm on something flat (arm of the chair, table, etc). 3) Sit still with your feet flat on the floor, resting, for at least 5 minutes.  4) Check your blood pressure. Take 1-2 readings.  5) Write down these readings and bring with you to any provider appointments.  Bring your home blood pressure machine with you to a provider's office for accuracy comparison at least once a year.   Make sure you take your blood pressure medications before you come to any office visit, even if you were asked to fast for labs.   Please reach out with any questions or concerns!  Catie Hedwig Morton, PharmD, [facility] [phone]

## 2022-04-07 ENCOUNTER — Other Ambulatory Visit: Payer: Self-pay

## 2022-04-09 ENCOUNTER — Other Ambulatory Visit: Payer: Self-pay

## 2022-04-21 ENCOUNTER — Ambulatory Visit: Payer: Medicaid Other | Attending: Family Medicine | Admitting: Family Medicine

## 2022-04-21 ENCOUNTER — Encounter: Payer: Self-pay | Admitting: Family Medicine

## 2022-04-21 VITALS — BP 143/86 | HR 59 | Temp 98.0°F | Resp 15 | Ht 64.0 in | Wt 192.0 lb

## 2022-04-21 DIAGNOSIS — G8929 Other chronic pain: Secondary | ICD-10-CM

## 2022-04-21 DIAGNOSIS — Z23 Encounter for immunization: Secondary | ICD-10-CM | POA: Diagnosis not present

## 2022-04-21 DIAGNOSIS — I4891 Unspecified atrial fibrillation: Secondary | ICD-10-CM | POA: Diagnosis not present

## 2022-04-21 DIAGNOSIS — M25561 Pain in right knee: Secondary | ICD-10-CM

## 2022-04-21 DIAGNOSIS — E119 Type 2 diabetes mellitus without complications: Secondary | ICD-10-CM | POA: Diagnosis not present

## 2022-04-21 DIAGNOSIS — S00551D Superficial foreign body of lip, subsequent encounter: Secondary | ICD-10-CM

## 2022-04-21 DIAGNOSIS — Z8673 Personal history of transient ischemic attack (TIA), and cerebral infarction without residual deficits: Secondary | ICD-10-CM | POA: Diagnosis not present

## 2022-04-21 DIAGNOSIS — M25562 Pain in left knee: Secondary | ICD-10-CM | POA: Diagnosis not present

## 2022-04-21 LAB — POCT GLYCOSYLATED HEMOGLOBIN (HGB A1C): Hemoglobin A1C: 6 % — AB (ref 4.0–5.6)

## 2022-04-21 NOTE — Progress Notes (Unsigned)
Subjective:  Patient ID: [name], female    DOB: April 02, 1966  Age: 56 y.o. MRN: [id number]  CC: Annual Exam and Knee Pain (Due to fall in March)   HPI [name] is a 56 y.o. 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  During the fall she sustained a chip to her right upper front teeth and a laceration to her lower lip. Maxillofacial CT head revealed: IMPRESSION: 1. No acute intracranial hemorrhage. 2. No acute cervical spine fracture. 3. Soft tissue swelling and edema overlying the chin and lower lip. Questionable punctate foreign body in the lower lip. 4. No acute facial fracture. 5. Stable right temporoparietal lobe encephalomalacia likely related to old infarct.   She hit her right knee during the fall but her left knee hurts in the lateral aspect and feels like 'a pull'  She thinks her skin has become thinner  BP slightly elevated and she took Advil prior to coming here. Past Medical History:  Diagnosis Date   Clotting disorder ([place])    on xarelto   Essential hypertension    Heart murmur    a. 10/2015 Echo: EF 60-65%, no rwma, mild AI/MR, sev dil LA/RA, PASP 63mHg.   Noncompliance    [place]    a. 10/2015 during admission for CVA/AF.   [place]    a. CHA2DS2VASC = 4-->Xarelto;  b. 02/2016 Successful DCCV.  c. Recurrent fib   Pneumonia 2012 x 2; 2015   Stroke ([facility]    a. 20/3546Embolic CVA of mid right middle cerebral atery - recieved TPA-->small amt of asymptomatic hemorrhagic transformation.    Transient ischemic attack (TIA) 01/2013    Past Surgical History:  Procedure Laterality Date   CARDIOVERSION N/A 02/18/2013   Procedure: CARDIOVERSION;  Surgeon: MSanda Klein MD;  [place];  Service: Cardiovascular;  Laterality: N/A;   CARDIOVERSION N/A 02/23/2013   Procedure: CARDIOVERSION;  Surgeon: DLeonie Man MD;  [place]  Service: Cardiovascular;  Laterality: N/A;  [place]   CARDIOVERSION N/A 03/12/2016   Procedure: CARDIOVERSION;  Surgeon: DLarey Dresser MD;  [place]  Service: Cardiovascular;  Laterality: N/A;   RIGHT/LEFT HEART CATH AND CORONARY ANGIOGRAPHY N/A 04/02/2021   Procedure: RIGHT/LEFT HEART CATH AND CORONARY ANGIOGRAPHY;  Surgeon: SBelva Crome MD;  [place];  Service: Cardiovascular;  Laterality: N/A;   TEE WITHOUT CARDIOVERSION N/A 02/18/2013   Procedure: TRANSESOPHAGEAL ECHOCARDIOGRAM (TEE);  Surgeon: MSanda Klein MD;  [facility];  Service: Cardiovascular;  Laterality: N/A;   TUBAL LIGATION  1992    Family History  Problem Relation Age of Onset   Cancer Mother    Atrial fibrillation Mother    Breast cancer Mother    Hypertension Father    Atrial fibrillation Father    Peripheral vascular disease Father    Hypertension Other    Diabetes Other    Heart attack Neg Hx    Stroke Neg Hx    Rectal cancer Neg Hx    Colon cancer Neg Hx  Esophageal cancer Neg Hx    Stomach cancer Neg Hx     Social History   Socioeconomic History   Marital status: Single    Spouse name: Not on file   Number of children: Not on file   Years of education: Not on file   Highest education level: Not on file  Occupational History   Occupation: Training and development officer    Employer: [employer]   Occupation: in home health aide  Tobacco Use   Smoking status: Former    Years: 3.00    Types: Cigarettes    Quit date: 03/15/2013    Years since quitting: 9.1   Smokeless tobacco: Never  Vaping Use   Vaping  Use: Never used  Substance and Sexual Activity   Alcohol use: Yes    Comment: drinks on the weekends,   2-3 beers   Drug use: No   Sexual activity: Not Currently    Birth control/protection: None  Other Topics Concern   Not on file  Social History Narrative   Not on file   Social Determinants of Health   Financial Resource Strain: Not on file  Food Insecurity: Not on file  Transportation Needs: Not on file  Physical Activity: Not on file  Stress: Not on file  Social Connections: Not on file    Allergies  Allergen Reactions   Penicillins Hives and Other (See Comments)    Unknown  Has patient had a PCN reaction causing immediate rash, facial/tongue/throat swelling, SOB or lightheadedness with hypotension: No Has patient had a PCN reaction causing severe rash involving mucus membranes or skin necrosis: No Has patient had a PCN reaction that required hospitalization No Has patient had a PCN reaction occurring within the last 10 years: No If all of the above answers are "NO", then may proceed with Cephalosporin use.    Outpatient Medications Prior to Visit  Medication Sig Dispense Refill   Accu-Chek FastClix Lancets MISC USE AS INSTRUCTED TO CHECK BLOOD SUGAR ONCE DAILY. 102 each 0   acetaminophen (TYLENOL) 500 MG tablet Take 1,000 mg by mouth every 6 (six) hours as needed for headache.      atorvastatin (LIPITOR) 80 MG tablet Take 1 tablet (80 mg total) by mouth daily at 6 PM. 30 tablet 2   Blood Glucose Monitoring Suppl (ACCU-CHEK GUIDE ME) w/Device KIT 1 kit by Does not apply route daily. 1 kit 0   butalbital-acetaminophen-caffeine (FIORICET) 50-325-40 MG tablet TAKE 1 TABLET BY MOUTH EVERY 12 HOURS AS NEEDED. 40 tablet 0   dapagliflozin propanediol (FARXIGA) 10 MG TABS tablet Take 1 tablet by mouth daily. Need office visit for refills. 90 tablet 3   digoxin (LANOXIN) 0.25 MG tablet Take 1 tablet (0.25 mg total) by mouth daily. 90 tablet 3   furosemide (LASIX) 20 MG tablet  Take 2 tablets (40 mg total) by mouth daily. 60 tablet 3   gabapentin (NEURONTIN) 300 MG capsule TAKE 1 CAPSULE (300 MG TOTAL) BY MOUTH 2 (TWO) TIMES DAILY. 90 capsule 0   glucose blood (ACCU-CHEK GUIDE) test strip Use as instructed 100 strip 0   losartan (COZAAR) 25 MG tablet Take 1/2 tablet (12.5 mg total) by mouth daily. 45 tablet 3   metoprolol succinate (TOPROL-XL) 50 MG 24 hr tablet Take 3 tablets (150 mg total) by mouth daily. 90 tablet 1   Misc. Devices MISC Blood pressure monitor.  Diagnosis Hypertension 1 each 0   rivaroxaban (XARELTO) 20 MG TABS tablet TAKE 1 TABLET (20 MG TOTAL)  BY MOUTH DAILY WITH SUPPER. 90 tablet 1   spironolactone (ALDACTONE) 25 MG tablet Take 0.5 tablets (12.5 mg total) by mouth daily. 45 tablet 3   No facility-administered medications prior to visit.     ROS Review of Systems *** Objective:  BP (!) 143/86 (BP Location: Left Arm, Patient Position: Sitting, Cuff Size: Large)   Pulse (!) 59   Temp 98 F (36.7 C)   Resp 15   Ht _0  (1.626 m)   Wt 192 lb (87.1 kg)   SpO2 98%   BMI 32.96 kg/m      04/21/2022    3:17 PM 11/21/2021    8:00 PM 11/21/2021    7:00 PM  BP/Weight  Systolic BP 588 502 774  Diastolic BP 86 96 95  Wt. (Lbs) 192    BMI 32.96 kg/m2        Physical Exam ***    Latest Ref Rng & Units 11/21/2021    4:39 PM 11/21/2021    4:16 PM 07/16/2021    3:54 PM  CMP  Glucose 70 - 99 mg/dL 127  124  93   BUN 6 - 20 mg/dL _1 Creatinine 0.44 - 1.00 mg/dL 0.70  0.92  1.05   Sodium 135 - 145 mmol/L 143  139  141   Potassium 3.5 - 5.1 mmol/L 3.4  4.3  5.2   Chloride 98 - 111 mmol/L 106  105  102   CO2 22 - 32 mmol/L  23  20   Calcium 8.9 - 10.3 mg/dL  9.4  9.8   Total Protein 6.5 - 8.1 g/dL  8.3    Total Bilirubin 0.3 - 1.2 mg/dL  0.7    Alkaline Phos 38 - 126 U/L  85    AST 15 - 41 U/L  27    ALT 0 - 44 U/L  16      Lipid Panel     Component Value Date/Time   CHOL 111 03/29/2021 0036   CHOL 101 10/21/2019 1458    TRIG 71 03/29/2021 0036   HDL 18 (L) 03/29/2021 0036   HDL 36 (L) 10/21/2019 1458   CHOLHDL 6.2 03/29/2021 0036   VLDL 14 03/29/2021 0036   LDLCALC 79 03/29/2021 0036   LDLCALC 49 10/21/2019 1458    CBC    Component Value Date/Time   WBC 9.6 11/21/2021 1652   RBC 4.79 11/21/2021 1652   HGB 12.7 11/21/2021 1652   HCT 41.7 11/21/2021 1652   PLT 207 11/21/2021 1652   MCV 87.1 11/21/2021 1652   MCH 26.5 11/21/2021 1652   MCHC 30.5 11/21/2021 1652   RDW 14.3 11/21/2021 1652   LYMPHSABS 1.8 03/28/2021 0529   MONOABS 0.6 03/28/2021 0529   EOSABS 0.1 03/28/2021 0529   BASOSABS 0.1 03/28/2021 0529    Lab Results  Component Value Date   HGBA1C 6.0 (A) 04/21/2022    Assessment & Plan:  There are no diagnoses linked to this encounter.  Health Care Maintenance: *** No orders of the defined types were placed in this encounter.   Follow-up: Return for nurse visit, 2nd dose shingrix, Chronic medical conditions with PCP in 6 months.       [name], MD, FAAFP. [facility] and [place], [place]   04/21/2022, 4:06 PM

## 2022-04-21 NOTE — Progress Notes (Unsigned)
Fall in March, b/l knee pain, chipped tooth and broke skin inside lip, possible tooth debris in mouth  Shingrix vaccine interest

## 2022-04-21 NOTE — Patient Instructions (Signed)
Chronic Knee Pain, Adult Chronic knee pain is pain in one or both knees that lasts longer than 3 months. Symptoms of chronic knee pain may include swelling, stiffness, and discomfort. Age-related wear and tear (osteoarthritis) of the knee joint is the most common cause of chronic knee pain. Other possible causes include: A long-term immune-related disease that causes inflammation of the knee (rheumatoid arthritis). This usually affects both knees. Inflammatory arthritis, such as gout or pseudogout. An injury to the knee that causes arthritis. An injury to the knee that damages the ligaments. Ligaments are strong tissues that connect bones to each other. Runner's knee or pain behind the kneecap. Treatment for chronic knee pain depends on the cause. The main treatments for chronic knee pain are physical therapy and weight loss. This condition may also be treated with medicines, injections, a knee sleeve or brace, and by using crutches. Rest, ice, pressure (compression), and elevation, also known as RICE therapy, may also be recommended. Follow these instructions at home: If you have a knee sleeve or brace:  Wear the knee sleeve or brace as told by your health care provider. Remove it only as told by your health care provider. Loosen it if your toes tingle, become numb, or turn cold and blue. Keep it clean. If the sleeve or brace is not waterproof: Do not let it get wet. Remove it if allowed by your health care provider, or cover it with a watertight covering when you take a bath or a shower. Managing pain, stiffness, and swelling     If directed, apply heat to the affected area as often as told by your health care provider. Use the heat source that your health care provider recommends, such as a moist heat pack or a heating pad. If you have a removable knee sleeve or brace, remove it as told by your health care provider. Place a towel between your skin and the heat source. Leave the heat on for  20-30 minutes. Remove the heat if your skin turns bright red. This is especially important if you are unable to feel pain, heat, or cold. You may have a greater risk of getting burned. If directed, put ice on the affected area. To do this: If you have a removable knee sleeve or brace, remove it as told by your health care provider. Put ice in a plastic bag. Place a towel between your skin and the bag. Leave the ice on for 20 minutes, 2-3 times a day. Remove the ice if your skin turns bright red. This is very important. If you cannot feel pain, heat, or cold, you have a greater risk of damage to the area. Move your toes often to reduce stiffness and swelling. Raise (elevate) the injured area above the level of your heart while you are sitting or lying down. Activity Avoid high-impact activities or exercises, such as running, jumping rope, or doing jumping jacks. Follow the exercise plan that your health care provider designed for you. Your health care provider may suggest that you: Avoid activities that make knee pain worse. This may require you to change your exercise routines, sport participation, or job duties. Wear shoes with cushioned soles. Avoid sports that require running and sudden changes in direction. Do physical therapy. Physical therapy is planned to match your needs and abilities. It may include exercises for strength, flexibility, stability, and endurance. Do exercises that increase balance and strength, such as tai chi and yoga. Do not use the injured limb to support your   body weight until your health care provider says that you can. Use crutches as told by your health care provider. Return to your normal activities as told by your health care provider. Ask your health care provider what activities are safe for you. General instructions Take over-the-counter and prescription medicines only as told by your health care provider. Lose weight if you are overweight. Losing even a  little weight can reduce knee pain. Ask your health care provider what your ideal weight is, and how to safely lose extra weight. A dietitian may be able to help you plan your meals. Do not use any products that contain nicotine or tobacco, such as cigarettes, e-cigarettes, and chewing tobacco. These can delay healing. If you need help quitting, ask your health care provider. Keep all follow-up visits. This is important. Contact a health care provider if: You have knee pain that is not getting better or gets worse. You are unable to do your physical therapy exercises due to knee pain. Get help right away if: Your knee swells and the swelling becomes worse. You cannot move your knee. You have severe knee pain. Summary Knee pain that lasts more than 3 months is considered chronic knee pain. The main treatments for chronic knee pain are physical therapy and weight loss. You may also need to take medicines, wear a knee sleeve or brace, use crutches, and apply ice or heat. Losing even a little weight can reduce knee pain. Ask your health care provider what your ideal weight is, and how to safely lose extra weight. A dietitian may be able to help you plan your meals. Follow the exercise plan that your health care provider designed for you. This information is not intended to replace advice given to you by your health care provider. Make sure you discuss any questions you have with your health care provider. Document Revised: 02/15/2020 Document Reviewed: 02/15/2020 Elsevier Patient Education  [place].

## 2022-04-22 NOTE — Progress Notes (Signed)
Cardiology Office Note:    Date:  05/01/2022   ID:  [name], DOB [birth year], MRN [id number]  PCP:  [name], MD  Cardiologist:  Sanda Klein, MD  Electrophysiologist:  None   Referring MD: [name], MD   Chief Complaint: follow-up of CHF and atrial fibrillation  History of Present Illness:    [name] is a 56 y.o. female with a history of non-obstructive CAD on cardiac catheterization in 03/2021, non-ischemic cardiomyopathy/ chronic combined CHF with EF of 25-30% in 03/2021, chronic atrial fibrillation/ flutter on Xarelto, hypertension, dyslipidemia, type 2 diabetes mellitus, prior stroke, severe obstructive sleep apnea, and obesity who is followed by Dr. Sallyanne Kuster and presents today for routine follow-up.   Patient is primary followed by Cardiology for CHF and atrial fibrillation. She has a long history of atrial fibrillation which is felt to be permanent at this point. She was admitted in 03/2021 with acute CHF after presenting with worsening shortness of breath, edema, and weight gain. Echo showed LVEF of 25-30% with global hypokinesis and indeterminate diastolic parameters, moderately enlarged RV with mildly reduced RV, mild AI, moderate MR, and severe TR. EF was down from 60-65% on prior Echo in 2020. She was diuresed with IV Lasix and then underwent R/LHC which showed 60% stenosis of mid LAD, 75% stenosis of distal LCX, and 70% stenosis of proximal 1st Diag as well as mild pulmonary hypertension. Medical therapy was recommended. She was started GDMT and transitioned to PO Lasix. Cardiomyopathy was felt to be tachymediated in nature.   Patient was last seen by Dr. Sallyanne Kuster in 07/2021 at which time she was doing well from a cardiac standpoint. She noted some dyspnea after climbing 1 flight of steps but dyspnea did not interfere with usual daily activities. Rate control of her atrial fibrillation had been difficult but rates were well controlled in the office. She was on  Digoxin and Toprol-XL. She was previously on Diltiazem but this was stopped when she was found to have a reduced EF. Repeat Echo was ordered with plans to transition from Losartan to [facility] if EF had not improved. If EF had normalized, plan was to also restart Diltiazem and stop Digoxin. However, repeat Echo has not been performed yet.  Patient presents today for follow-up. She has been doing well since last visit. She denies any chest pain, shortness of breath, orthopnea, or lower extremity edema. She has chronic atrial fibrillation and can feel her heart beating irregular at times but this is not bothersome to her and she denies any feeling of her heart racing. No lightheadedness, dizziness, or syncope. She does have a history of sleep apnea but is not using her CPAP machine - she does not like the way the masks fit and also has difficulty positioning the CPAP machine based on the layout of the furniture in her room. She is not interested in meeting with Dr. Claiborne Billings right now to discuss other mask options but may be interested at beginning of next year (she is hoping to move and get her own place around that time). She is only taking her Lasix as needed right now which seems to be working well for her.  Past Medical History:  Diagnosis Date   Clotting disorder ([place])    on xarelto   Essential hypertension    Heart murmur    a. 10/2015 Echo: EF 60-65%, no rwma, mild AI/MR, sev dil LA/RA, PASP 22mHg.   Noncompliance    NSVT (nonsustained ventricular tachycardia) (  [place])    a. 10/2015 during admission for CVA/AF.   Persistent atrial fibrillation ([place])    a. CHA2DS2VASC = 4-->Xarelto;  b. 02/2016 Successful DCCV.  c. Recurrent fib   Pneumonia 2012 x 2; 2015   Stroke [facility])    a. 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   Transient ischemic attack (TIA) 01/2013    Past Surgical History:  Procedure Laterality Date    CARDIOVERSION N/A 02/18/2013   Procedure: CARDIOVERSION;  Surgeon: Sanda Klein, MD;  Location: [facility];  Service: Cardiovascular;  Laterality: N/A;   CARDIOVERSION N/A 02/23/2013   Procedure: CARDIOVERSION;  Surgeon: Leonie Man, MD;  Location: [facility];  Service: Cardiovascular;  Laterality: N/A;  [place] CV   CARDIOVERSION N/A 03/12/2016   Procedure: CARDIOVERSION;  Surgeon: Larey Dresser, MD;  Location: [facility];  Service: Cardiovascular;  Laterality: N/A;   RIGHT/LEFT HEART CATH AND CORONARY ANGIOGRAPHY N/A 04/02/2021   Procedure: RIGHT/LEFT HEART CATH AND CORONARY ANGIOGRAPHY;  Surgeon: Belva Crome, MD;  Location:  CV LAB;  Service: Cardiovascular;  Laterality: N/A;   TEE WITHOUT CARDIOVERSION N/A 02/18/2013   Procedure: TRANSESOPHAGEAL ECHOCARDIOGRAM (TEE);  Surgeon: Sanda Klein, MD;  Location: [facility] ENDOSCOPY;  Service: Cardiovascular;  Laterality: N/A;   TUBAL LIGATION  1992    Current Medications: Current Meds  Medication Sig   Accu-Chek FastClix Lancets MISC USE AS INSTRUCTED TO CHECK BLOOD SUGAR ONCE DAILY.   acetaminophen (TYLENOL) 500 MG tablet Take 1,000 mg by mouth every 6 (six) hours as needed for headache.    atorvastatin (LIPITOR) 80 MG tablet Take 1 tablet (80 mg total) by mouth daily at 6 PM.   Blood Glucose Monitoring Suppl (ACCU-CHEK GUIDE ME) w/Device KIT 1 kit by Does not apply route daily.   butalbital-acetaminophen-caffeine (FIORICET) 50-325-40 MG tablet TAKE 1 TABLET BY MOUTH EVERY 12 HOURS AS NEEDED.   dapagliflozin propanediol (FARXIGA) 10 MG TABS tablet Take 1 tablet by mouth daily. Need office visit for refills.   digoxin (LANOXIN) 0.25 MG tablet Take 1 tablet (0.25 mg total) by mouth daily.   furosemide (LASIX) 20 MG tablet Take 2 tablets (40 mg total) by mouth daily. (Patient taking differently: Take 40 mg by mouth daily as needed for fluid or edema. Take medication daily as needed only for swelling/weight gain of 5 lbs in 1 week or 3 lbs  over night)   gabapentin (NEURONTIN) 300 MG capsule TAKE 1 CAPSULE (300 MG TOTAL) BY MOUTH 2 (TWO) TIMES DAILY.   glucose blood (ACCU-CHEK GUIDE) test strip Use as instructed   losartan (COZAAR) 25 MG tablet Take 1/2 tablet (12.5 mg total) by mouth daily.   metoprolol succinate (TOPROL-XL) 50 MG 24 hr tablet Take 3 tablets (150 mg total) by mouth daily.   Misc. Devices MISC Blood pressure monitor.  Diagnosis Hypertension   rivaroxaban (XARELTO) 20 MG TABS tablet TAKE 1 TABLET (20 MG TOTAL) BY MOUTH DAILY WITH SUPPER.   spironolactone (ALDACTONE) 25 MG tablet Take 0.5 tablets (12.5 mg total) by mouth daily.     Allergies:   Penicillins   Social History   Socioeconomic History   Marital status: Single    Spouse name: Not on file   Number of children: Not on file   Years of education: Not on file   Highest education level: Not on file  Occupational History   Occupation: [employer]    Employer: [employer]   Occupation:  in home health aide  Tobacco Use   Smoking status: Former    Years: 3.00    Types: Cigarettes    Quit date: 03/15/2013    Years since quitting: 9.1   Smokeless tobacco: Never  Vaping Use   Vaping Use: Never used  Substance and Sexual Activity   Alcohol use: Yes    Comment: drinks on the weekends,   2-3 beers   Drug use: No   Sexual activity: Not Currently    Birth control/protection: None  Other Topics Concern   Not on file  Social History Narrative   Not on file   Social Determinants of Health   Financial Resource Strain: Not on file  Food Insecurity: Not on file  Transportation Needs: Not on file  Physical Activity: Not on file  Stress: Not on file  Social Connections: Not on file     Family History: The patient's family history includes Atrial fibrillation in her father and mother; Breast cancer in her mother; Cancer in her mother; Diabetes in an other family member; Hypertension in her father and another family member; Peripheral vascular  disease in her father. There is no history of Heart attack, Stroke, Rectal cancer, Colon cancer, Esophageal cancer, or Stomach cancer.  ROS:   Please see the history of present illness.     EKGs/Labs/Other Studies Reviewed:    The following studies were reviewed:  Echocardiogram 03/28/2021: Impressions:  1. Left ventricular ejection fraction, by estimation, is 25 to 30%. The  left ventricle has severely decreased function. The left ventricle  demonstrates global hypokinesis. There is mild left ventricular  hypertrophy. Left ventricular diastolic parameters   are indeterminate.   2. Right ventricular systolic function is mildly reduced. The right  ventricular size is moderately enlarged. There is normal pulmonary artery  systolic pressure. The estimated right ventricular systolic pressure is  10.2 mmHg.   3. Left atrial size was mildly dilated.   4. Right atrial size was moderately dilated.   5. The mitral valve is normal in structure. Moderate mitral valve  regurgitation. No evidence of mitral stenosis.   6. Tricuspid valve regurgitation is severe. Suspect functional from  annular dilatation.   7. The aortic valve is tricuspid. Aortic valve regurgitation is mild. No  aortic stenosis is present.   8. The inferior vena cava is dilated in size with >50% respiratory  variability, suggesting right atrial pressure of 8 mmHg.   9. The patient was in atrial fibrillation.  10. Significant change in LV and RV function compared to prior echo.  _______________  Right/Left Cardiac Catheterization 04/02/2021: 60% mid LAD after second diagonal. 75% distal circumflex before small third obtuse marginal. Right dominant anatomy Mild pulmonary hypertension with mean pressure 31 mmHg, mean capillary wedge pressure 16 mmHg, pulmonary vascular resistance 2.56 Wood units. Pulmonary artery O2 saturation 73%, cardiac output and index are 5.87 L/min and 3.01 L/min/m respectively.    Recommendations: Guideline directed therapy for systolic dysfunction Rate control Further management per treating team.  Diagnostic Dominance: Right    EKG:  EKG not ordered today.  Recent Labs: 11/21/2021: Hemoglobin 12.7; Platelets 207 04/23/2022: ALT 14; BUN 11; Creatinine, Ser 1.17; Potassium 4.3; Sodium 142  Recent Lipid Panel    Component Value Date/Time   CHOL 115 04/23/2022 1116   TRIG 68 04/23/2022 1116   HDL 40 04/23/2022 1116   CHOLHDL 6.2 03/29/2021 0036   VLDL 14 03/29/2021 0036   LDLCALC 61 04/23/2022 1116    Physical Exam:  Vital Signs: BP 120/80 (BP Location: Left Arm)   Pulse 78   Ht 5' 4" (1.626 m)   Wt 195 lb 9.6 oz (88.7 kg)   SpO2 100%   BMI 33.57 kg/m     Wt Readings from Last 3 Encounters:  05/01/22 195 lb 9.6 oz (88.7 kg)  04/21/22 192 lb (87.1 kg)  11/21/21 200 lb (90.7 kg)     General: 56 y.o. female in no acute distress. HEENT: Normocephalic and atraumatic. Sclera clear.  Neck: Supple. No carotid bruits. No JVD. Heart: Irregularly irregular rhythm with normal rate. No murmurs, gallops, or rubs. Radial pulses 2+ and equal bilaterally. Lungs: No increased work of breathing. Clear to ausculation bilaterally. No wheezes, rhonchi, or rales.  Abdomen: Soft, non-distended, and non-tender to palpation.  Extremities: No lower extremity edema.    Skin: Warm and dry. Neuro: Alert and oriented x3. No focal deficits. Psych: Normal affect. Responds appropriately.  Assessment:    1. Chronic combined systolic and diastolic CHF (congestive heart failure) ([place])   2. Non-ischemic cardiomyopathy ([place])   3. Chronic atrial fibrillation ([place])   4. Coronary artery disease involving native coronary artery of native heart without angina pectoris   5. Primary hypertension   6. Dyslipidemia   7. Type 2 diabetes mellitus with complication, without long-term current use of insulin (HCC)     Plan:    Chronic Combined CHF Non-Ischemic Cardiomyopathy  (Presumed Tachy-Mediated) Echo in 03/2021 showed Echo showed LVEF of 25-30% with global hypokinesis and indeterminate diastolic parameters, moderately enlarged RV with mildly reduced RV, mild AI, moderate MR, and severe TR. R/LHC at the time showed non-obstructive CAD that was out of proportion to degree of cardiomyopathy. Felt to be tachy-medicated given atrial fibrillation. - Euvolemic on exam.  - Echo was ordered at last visit but has not been done yet. Will get this rescheduled today. - Continue Lasix 31m daily as needed for weight gain and worsening lower extremity edema. - Continue Losartan 12.561mdaily. - Continue Toprol-XL 15062maily. - Continue Spironolactone 12.5mg62mily. - Continue Farxiga 10mg30mly. - Will recheck BMET when she comes by for Digoxin level next week. - Will repeat Echo as above. If EF has not improved, will stop Losartan and switch to Entrest 24-26mg 44me daily per Dr. CroitoVictorino Decembermendation at last visit.  Chronic Atrial Fibrillation Long history of atrial fibrillation that is felt to likely be permanent at this time.  - Rate controlled. - Continue Toprol-XL 150mg d22m. - Continue Digoxin 0.25mg da58m - Continue chronic anticoagulation with Xarelto 20mg dai92m- If EF has normalized on repeat Echo, may restart Diltiazem and stop Digoxin per Dr. Croitoru'Victorino Decemberdation at last visit. Did discuss this with PCP and she states Digoxin seems to help her atrial fibrillation a lot so she may prefer to stay on this. Patient has already taken her Digoxin today but will come back next week to have a Digoxin level checked.  CAD LHC in 03/2021 showed 60% stenosis of mid LAD, 75% stenosis of distal LCX, and 70% stenosis of proximal 1st Diag. Medical therapy was recommended at that time. - No chest pain.  - No aspirin due to need for DOAC. - Continue beta-blocker and statin.   Hypertension BP well controlled.  - Continue medications for CHF as above.    Dyslipidemia Most recent lipid panel on 04/23/2022: Total Cholesterol 115, Triglycerides 68, HDL 40, LDL 61.  - Continue Lipitor 80mg dail16m Type 2 Diabetes Mellitus Hemoglobin A1 6.0  on 04/21/2022. - On Farxiga as above. - Management per PCP.  Disposition: Follow up in 6 months.   Medication Adjustments/Labs and Tests Ordered: Current medicines are reviewed at length with the patient today.  Concerns regarding medicines are outlined above.  Orders Placed This Encounter  Procedures   Basic metabolic panel   Digoxin level   No orders of the defined types were placed in this encounter.   Patient Instructions  Medication Instructions:  Your physician recommends that you continue on your current medications as directed. Please refer to the Current Medication list given to you today.   *If you need a refill on your cardiac medications before your next appointment, please call your pharmacy*   Lab Work: Your physician recommends that you return for labs next week.  Digoxin level BMET If you have labs (blood work) drawn today and your tests are completely normal, you will receive your results only by: [place] (if you have MyChart) OR A paper copy in the mail If you have any lab test that is abnormal or we need to change your treatment, we will call you to review the results.   Testing/Procedures: NONE ordered at this time of appointment     Follow-Up: At [facility], you and your health needs are our priority.  As part of our continuing mission to provide you with exceptional heart care, we have created designated Provider Care Teams.  These Care Teams include your primary Cardiologist (physician) and Advanced Practice Providers (APPs -  Physician Assistants and Nurse Practitioners) who all work together to provide you with the care you need, when you need it.  We recommend signing up for the patient portal called "MyChart".  Sign up information is provided on  this After Visit Summary.  MyChart is used to connect with patients for Virtual Visits (Telemedicine).  Patients are able to view lab/test results, encounter notes, upcoming appointments, etc.  Non-urgent messages can be sent to your provider as well.   To learn more about what you can do with MyChart, go to [email or web address].    Your next appointment:   6 month(s)  The format for your next appointment:   In Person  Provider:   Sanda Klein, MD  or Sande Rives, PA-C        Other Instructions Heart Failure Education:  Weigh yourself EVERY morning after you go to the bathroom but before you eat or drink anything. Write this number down in a weight log/diary. If you gain 3 pounds overnight or 5 pounds in a week, call the office. Take your medicines as prescribed. If you have concerns about your medications, please call us before you stop taking them. Eat low salt foods--Limit salt (sodium) to 2000 mg per day. This will help prevent your body from holding onto fluid. Read food labels as many processed foods have a lot of sodium, especially canned goods and prepackaged meats. If you would like some assistance choosing low sodium foods, we would be happy to set you up with a nutritionist. Limit all fluids for the day to less than 2 liters (64 ounces). Fluid includes all drinks, coffee, juice, ice chips, soup, jello, and all other liquids. Stay as active as you can everyday. Staying active will give you more energy and make your muscles stronger. Start with 5 minutes at a time and work your way up to 30 minutes a day. Break up your activities--do some in the morning and some in the afternoon. Start with 3  days per week and work your way up to 5 days as you can.  If you have chest pain, feel short of breath, dizzy, or lightheaded, STOP. If you don't feel better after a short rest, call 911. If you do feel better, call the office to let us know you have symptoms with exercise.    Important  Information About Sugar         Signed, Eppie Gibson  05/01/2022 8:26 PM    [facility]

## 2022-04-23 ENCOUNTER — Ambulatory Visit
Admission: RE | Admit: 2022-04-23 | Discharge: 2022-04-23 | Disposition: A | Discharge status: Home / Self Care | Payer: Medicaid Other | Source: Ambulatory Visit | Attending: Family Medicine | Admitting: Family Medicine

## 2022-04-23 ENCOUNTER — Ambulatory Visit: Payer: Medicaid Other | Attending: Family Medicine

## 2022-04-23 ENCOUNTER — Other Ambulatory Visit (HOSPITAL_COMMUNITY)
Admission: RE | Admit: 2022-04-23 | Discharge: 2022-04-23 | Disposition: A | Discharge status: Home / Self Care | Payer: Medicaid Other | Source: Ambulatory Visit | Attending: Family Medicine | Admitting: Family Medicine

## 2022-04-23 DIAGNOSIS — M25561 Pain in right knee: Secondary | ICD-10-CM | POA: Diagnosis not present

## 2022-04-23 DIAGNOSIS — E119 Type 2 diabetes mellitus without complications: Secondary | ICD-10-CM | POA: Diagnosis not present

## 2022-04-23 DIAGNOSIS — Z113 Encounter for screening for infections with a predominantly sexual mode of transmission: Secondary | ICD-10-CM

## 2022-04-23 DIAGNOSIS — G8929 Other chronic pain: Secondary | ICD-10-CM

## 2022-04-23 DIAGNOSIS — M25562 Pain in left knee: Secondary | ICD-10-CM | POA: Diagnosis not present

## 2022-04-24 ENCOUNTER — Other Ambulatory Visit: Payer: Self-pay | Admitting: Family Medicine

## 2022-04-24 DIAGNOSIS — G8929 Other chronic pain: Secondary | ICD-10-CM

## 2022-04-24 LAB — CMP14+EGFR
ALT: 14 IU/L (ref 0–32)
AST: 19 IU/L (ref 0–40)
Albumin/Globulin Ratio: 1.2 (ref 1.2–2.2)
Albumin: 4.3 g/dL (ref 3.8–4.9)
Alkaline Phosphatase: 111 IU/L (ref 44–121)
BUN/Creatinine Ratio: 9 (ref 9–23)
BUN: 11 mg/dL (ref 6–24)
Bilirubin Total: 0.8 mg/dL (ref 0.0–1.2)
CO2: 22 mmol/L (ref 20–29)
Calcium: 9.6 mg/dL (ref 8.7–10.2)
Chloride: 103 mmol/L (ref 96–106)
Creatinine, Ser: 1.17 mg/dL — ABNORMAL HIGH (ref 0.57–1.00)
Globulin, Total: 3.7 g/dL (ref 1.5–4.5)
Glucose: 110 mg/dL — ABNORMAL HIGH (ref 70–99)
Potassium: 4.3 mmol/L (ref 3.5–5.2)
Sodium: 142 mmol/L (ref 134–144)
Total Protein: 8 g/dL (ref 6.0–8.5)
eGFR: 54 mL/min/{1.73_m2} — ABNORMAL LOW (ref >59)

## 2022-04-24 LAB — CERVICOVAGINAL ANCILLARY ONLY
Bacterial Vaginitis (gardnerella): NEGATIVE
Candida Glabrata: NEGATIVE
Candida Vaginitis: NEGATIVE
Chlamydia: NEGATIVE
Comment: NEGATIVE
Comment: NEGATIVE
Comment: NEGATIVE
Comment: NEGATIVE
Comment: NEGATIVE
Comment: NORMAL
Neisseria Gonorrhea: NEGATIVE
Trichomonas: NEGATIVE

## 2022-04-24 LAB — MICROALBUMIN / CREATININE URINE RATIO
Creatinine, Urine: 247.1 mg/dL
Microalb/Creat Ratio: 17 mg/g creat (ref 0–29)
Microalbumin, Urine: 43 ug/mL

## 2022-04-24 LAB — RPR: RPR Ser Ql: NONREACTIVE

## 2022-04-24 LAB — HIV ANTIBODY (ROUTINE TESTING W REFLEX): HIV Screen 4th Generation wRfx: NONREACTIVE

## 2022-04-24 LAB — LP+NON-HDL CHOLESTEROL
Cholesterol, Total: 115 mg/dL (ref 100–199)
HDL: 40 mg/dL (ref >39)
LDL Chol Calc (NIH): 61 mg/dL (ref 0–99)
Total Non-HDL-Chol (LDL+VLDL): 75 mg/dL (ref 0–129)
Triglycerides: 68 mg/dL (ref 0–149)
VLDL Cholesterol Cal: 14 mg/dL (ref 5–40)

## 2022-05-01 ENCOUNTER — Encounter: Payer: Self-pay | Admitting: Student

## 2022-05-01 ENCOUNTER — Ambulatory Visit (INDEPENDENT_AMBULATORY_CARE_PROVIDER_SITE_OTHER): Payer: Medicaid Other | Admitting: Student

## 2022-05-01 VITALS — BP 120/80 | HR 78 | Ht 64.0 in | Wt 195.6 lb

## 2022-05-01 DIAGNOSIS — I2510 Atherosclerotic heart disease of native coronary artery without angina pectoris: Secondary | ICD-10-CM

## 2022-05-01 DIAGNOSIS — I428 Other cardiomyopathies: Secondary | ICD-10-CM | POA: Diagnosis not present

## 2022-05-01 DIAGNOSIS — E118 Type 2 diabetes mellitus with unspecified complications: Secondary | ICD-10-CM | POA: Diagnosis not present

## 2022-05-01 DIAGNOSIS — E785 Hyperlipidemia, unspecified: Secondary | ICD-10-CM | POA: Diagnosis not present

## 2022-05-01 DIAGNOSIS — I5042 Chronic combined systolic (congestive) and diastolic (congestive) heart failure: Secondary | ICD-10-CM

## 2022-05-01 DIAGNOSIS — I10 Essential (primary) hypertension: Secondary | ICD-10-CM | POA: Diagnosis not present

## 2022-05-01 DIAGNOSIS — I482 Chronic atrial fibrillation, unspecified: Secondary | ICD-10-CM | POA: Diagnosis not present

## 2022-05-01 NOTE — Patient Instructions (Signed)
Medication Instructions:  Your physician recommends that you continue on your current medications as directed. Please refer to the Current Medication list given to you today.   *If you need a refill on your cardiac medications before your next appointment, please call your pharmacy*   Lab Work: Your physician recommends that you return for labs next week.  Digoxin level BMET If you have labs (blood work) drawn today and your tests are completely normal, you will receive your results only by: [place] (if you have MyChart) OR A paper copy in the mail If you have any lab test that is abnormal or we need to change your treatment, we will call you to review the results.   Testing/Procedures: NONE ordered at this time of appointment     Follow-Up: At [facility], you and your health needs are our priority.  As part of our continuing mission to provide you with exceptional heart care, we have created designated Provider Care Teams.  These Care Teams include your primary Cardiologist (physician) and Advanced Practice Providers (APPs -  Physician Assistants and Nurse Practitioners) who all work together to provide you with the care you need, when you need it.  We recommend signing up for the patient portal called "MyChart".  Sign up information is provided on this After Visit Summary.  MyChart is used to connect with patients for Virtual Visits (Telemedicine).  Patients are able to view lab/test results, encounter notes, upcoming appointments, etc.  Non-urgent messages can be sent to your provider as well.   To learn more about what you can do with MyChart, go to [email or web address].    Your next appointment:   6 month(s)  The format for your next appointment:   In Person  Provider:   Sanda Klein, MD  or Sande Rives, PA-C        Other Instructions Heart Failure Education:  Weigh yourself EVERY morning after you go to the bathroom but before you eat or drink  anything. Write this number down in a weight log/diary. If you gain 3 pounds overnight or 5 pounds in a week, call the office. Take your medicines as prescribed. If you have concerns about your medications, please call us before you stop taking them. Eat low salt foods--Limit salt (sodium) to 2000 mg per day. This will help prevent your body from holding onto fluid. Read food labels as many processed foods have a lot of sodium, especially canned goods and prepackaged meats. If you would like some assistance choosing low sodium foods, we would be happy to set you up with a nutritionist. Limit all fluids for the day to less than 2 liters (64 ounces). Fluid includes all drinks, coffee, juice, ice chips, soup, jello, and all other liquids. Stay as active as you can everyday. Staying active will give you more energy and make your muscles stronger. Start with 5 minutes at a time and work your way up to 30 minutes a day. Break up your activities--do some in the morning and some in the afternoon. Start with 3 days per week and work your way up to 5 days as you can.  If you have chest pain, feel short of breath, dizzy, or lightheaded, STOP. If you don't feel better after a short rest, call 911. If you do feel better, call the office to let us know you have symptoms with exercise.    Important Information About Sugar

## 2022-05-08 DIAGNOSIS — I5042 Chronic combined systolic (congestive) and diastolic (congestive) heart failure: Secondary | ICD-10-CM | POA: Diagnosis not present

## 2022-05-08 DIAGNOSIS — I2510 Atherosclerotic heart disease of native coronary artery without angina pectoris: Secondary | ICD-10-CM | POA: Diagnosis not present

## 2022-05-08 DIAGNOSIS — E118 Type 2 diabetes mellitus with unspecified complications: Secondary | ICD-10-CM | POA: Diagnosis not present

## 2022-05-08 DIAGNOSIS — I482 Chronic atrial fibrillation, unspecified: Secondary | ICD-10-CM | POA: Diagnosis not present

## 2022-05-08 DIAGNOSIS — E785 Hyperlipidemia, unspecified: Secondary | ICD-10-CM | POA: Diagnosis not present

## 2022-05-08 DIAGNOSIS — I10 Essential (primary) hypertension: Secondary | ICD-10-CM | POA: Diagnosis not present

## 2022-05-08 DIAGNOSIS — I428 Other cardiomyopathies: Secondary | ICD-10-CM | POA: Diagnosis not present

## 2022-05-10 LAB — BASIC METABOLIC PANEL
BUN/Creatinine Ratio: 14 (ref 9–23)
BUN: 13 mg/dL (ref 6–24)
CO2: 21 mmol/L (ref 20–29)
Calcium: 9.7 mg/dL (ref 8.7–10.2)
Chloride: 106 mmol/L (ref 96–106)
Creatinine, Ser: 0.95 mg/dL (ref 0.57–1.00)
Glucose: 96 mg/dL (ref 70–99)
Potassium: 4.5 mmol/L (ref 3.5–5.2)
Sodium: 145 mmol/L — ABNORMAL HIGH (ref 134–144)
eGFR: 70 mL/min/{1.73_m2} (ref >59)

## 2022-05-10 LAB — DIGOXIN LEVEL: Digoxin, Serum: 0.5 ng/mL (ref 0.5–0.9)

## 2022-05-14 ENCOUNTER — Ambulatory Visit: Payer: Medicaid Other | Admitting: Family

## 2022-05-26 ENCOUNTER — Other Ambulatory Visit: Payer: Medicaid Other | Admitting: Pharmacist

## 2022-05-26 ENCOUNTER — Telehealth: Payer: Self-pay | Admitting: Pharmacist

## 2022-05-26 ENCOUNTER — Ambulatory Visit: Payer: Medicaid Other

## 2022-05-26 NOTE — Progress Notes (Unsigned)
Attempted to contact patient for scheduled appointment for medication management. Left HIPAA compliant message for patient to return my call at their convenience.   Catie T. Denece Shearer, PharmD, BCACP [facility] [phone]  

## 2022-05-29 NOTE — Progress Notes (Unsigned)
Attempted to contact patient to reschedule phone appointment for medication management. Mailbox is full.   Catie Hedwig Morton, PharmD, [facility] [phone]

## 2022-06-02 ENCOUNTER — Telehealth: Payer: Self-pay | Admitting: Student

## 2022-06-02 NOTE — Telephone Encounter (Signed)
Patient said that she received a letter in the mail from [place] and dont know what it means. Please call to discuss letter with patient.

## 2022-06-02 NOTE — Telephone Encounter (Signed)
Gave patient lab results form 8/24. Sent message to L. Welch regarding echo for November.

## 2022-06-02 NOTE — Progress Notes (Signed)
Attempted to contact patient to reschedule appointment for medication management. Mail box is full.   Three unsuccessful outreach attempts.

## 2022-06-07 ENCOUNTER — Encounter: Payer: Self-pay | Admitting: Family

## 2022-06-07 ENCOUNTER — Ambulatory Visit: Payer: Medicaid Other | Attending: Family | Admitting: Family

## 2022-06-07 VITALS — BP 131/87 | HR 98 | Temp 98.3°F | Resp 16 | Ht 64.02 in | Wt 191.0 lb

## 2022-06-07 DIAGNOSIS — I4891 Unspecified atrial fibrillation: Secondary | ICD-10-CM

## 2022-06-07 DIAGNOSIS — Z7901 Long term (current) use of anticoagulants: Secondary | ICD-10-CM | POA: Diagnosis not present

## 2022-06-07 DIAGNOSIS — L853 Xerosis cutis: Secondary | ICD-10-CM

## 2022-06-07 NOTE — Progress Notes (Signed)
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

## 2022-06-07 NOTE — Progress Notes (Signed)
[name], is a 56 y.o. female  YBW:[id number]  JGO:[id number]  DOB - 30-Sep-1965  Subjective:  Chief Complaint and HPI: [name] is a 56 y.o. female here today with complaints of dry skin to the top of her hands. Patient works in [employer] at [employer] where she is constantly exposed to chemicals used to clean the dishes as well as hot water used in the process. Patient started noticing skin irritation to both hands about 7 days ago on top of both hands. The skin breakouts have improved and now she has dry skin. Patient reports no recent change to chemicals used at work or home either. 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 No family members or coworker is experiencing the same symptoms and patient never had similar experience in the past.  She has a history of chronic atrial fibrillation with RVR and is on anti-coagulation therapy. She is under the care of cardiology.  Denies fever, chills, chest pain, pain to hands, difficulty using both hands, skin itching, or any other symptom than above.  ED/Hospital notes reviewed.   Social History: Reviewed Family history: Reviewed  ROS:   Constitutional:  No f/c, No night sweats, No unexplained weight loss. EENT:  No vision changes, No blurry vision, No hearing changes. No mouth, throat, or ear problems.  Respiratory: No cough, No SOB Cardiac: No CP, no palpitations GI:  No abd pain, No N/V/D. GU: No Urinary s/sx Musculoskeletal: No joint pain Neuro: No headache, no dizziness, no motor weakness.  Skin: Report dryness of skin to both hands top  Endocrine:  No polydipsia. No polyuria.  Psych: Denies SI/HI  No problems updated.  ALLERGIES: Allergies  Allergen Reactions   Penicillins Hives and Other (See Comments)    Unknown  Has patient had a PCN reaction causing immediate rash, facial/tongue/throat swelling, SOB or lightheadedness with hypotension:  No Has patient had a PCN reaction causing severe rash involving mucus membranes or skin necrosis: No Has patient had a PCN reaction that required hospitalization No Has patient had a PCN reaction occurring within the last 10 years: No If all of the above answers are "NO", then may proceed with Cephalosporin use.    PAST MEDICAL HISTORY: Past Medical History:  Diagnosis Date   Clotting disorder ([place])    on xarelto   Essential hypertension    Heart murmur    a. 10/2015 Echo: EF 60-65%, no rwma, mild AI/MR, sev dil LA/RA, PASP 22mmHg.   Noncompliance    NSVT (nonsustained ventricular tachycardia) ([place])    a. 10/2015 during admission for CVA/AF.   Persistent atrial fibrillation ([place])    a. CHA2DS2VASC = 4-->Xarelto;  b. 02/2016 Successful DCCV.  c. 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   Transient ischemic attack (TIA) 01/2013    MEDICATIONS AT HOME: Prior to Admission medications   Medication Sig Start Date End Date Taking? Authorizing Provider  Accu-Chek FastClix Lancets MISC USE AS INSTRUCTED TO CHECK BLOOD SUGAR ONCE DAILY. 06/14/21   Fenton Foy, NP  acetaminophen (TYLENOL) 500 MG tablet Take 1,000 mg by mouth every 6 (six) hours as needed for headache.     [provider]  atorvastatin (LIPITOR) 80 MG tablet Take 1 tablet (80  mg total) by mouth daily at 6 PM. 05/24/21   Croitoru, Mihai, MD  Blood Glucose Monitoring Suppl (ACCU-CHEK GUIDE ME) w/Device KIT 1 kit by Does not apply route daily. 05/02/19   Hoy Register, MD  butalbital-acetaminophen-caffeine (FIORICET) 50-325-40 MG tablet TAKE 1 TABLET BY MOUTH EVERY 12 HOURS AS NEEDED. 06/17/21 06/17/22  Hoy Register, MD  dapagliflozin propanediol (FARXIGA) 10 MG TABS tablet Take 1 tablet by mouth daily. Need office visit for refills. 07/10/21   Croitoru, Mihai, MD  digoxin  (LANOXIN) 0.25 MG tablet Take 1 tablet (0.25 mg total) by mouth daily. 07/10/21   Croitoru, Mihai, MD  furosemide (LASIX) 20 MG tablet Take 2 tablets (40 mg total) by mouth daily. Patient taking differently: Take 40 mg by mouth daily as needed for fluid or edema. Take medication daily as needed only for swelling/weight gain of 5 lbs in 1 week or 3 lbs over night 07/16/21   Hoy Register, MD  gabapentin (NEURONTIN) 300 MG capsule TAKE 1 CAPSULE (300 MG TOTAL) BY MOUTH 2 (TWO) TIMES DAILY. 10/07/21 10/07/22  Hoy Register, MD  glucose blood (ACCU-CHEK GUIDE) test strip Use as instructed 06/14/21   Ivonne Andrew, NP  losartan (COZAAR) 25 MG tablet Take 1/2 tablet (12.5 mg total) by mouth daily. 07/10/21 06/30/22  Croitoru, Mihai, MD  metoprolol succinate (TOPROL-XL) 50 MG 24 hr tablet Take 3 tablets (150 mg total) by mouth daily. 10/09/21   Croitoru, Rachelle Hora, MD  Misc. Devices MISC Blood pressure monitor.  Diagnosis Hypertension 02/11/19   Hoy Register, MD  rivaroxaban (XARELTO) 20 MG TABS tablet TAKE 1 TABLET (20 MG TOTAL) BY MOUTH DAILY WITH SUPPER. 03/25/22 03/25/23  Croitoru, Mihai, MD  spironolactone (ALDACTONE) 25 MG tablet Take 0.5 tablets (12.5 mg total) by mouth daily. 07/10/21   Croitoru, Mihai, MD  albuterol (PROVENTIL HFA;VENTOLIN HFA) 108 (90 Base) MCG/ACT inhaler Inhale 2 puffs into the lungs every 6 (six) hours as needed for wheezing or shortness of breath. Patient not taking: Reported on 03/28/2021 11/17/18 03/28/21  Hoy Register, MD  fluticasone (FLONASE) 50 MCG/ACT nasal spray Place 2 sprays into both nostrils daily. Patient not taking: Reported on 03/28/2021 12/14/18 03/28/21  Storm Frisk, MD     Objective:  EXAM:   Vitals:   06/07/22 1019  BP: 131/87  Pulse: 98  Resp: 16  Temp: 98.3 F (36.8 C)  SpO2: 99%  Weight: 191 lb (86.6 kg)  Height: 5' 4.02" (1.626 m)    General appearance : A&OX3. NAD. Non-toxic-appearing HEENT: Atraumatic and Normocephalic.  PERRLA. EOM  intact.  TM clear B. Mouth-MMM, post pharynx WNL w/o erythema, No PND. Neck: supple, no JVD. No cervical lymphadenopathy. No thyromegaly Chest/Lungs:  Breathing-non-labored, Good air entry bilaterally, breath sounds normal without rales, rhonchi, or wheezing  CVS: irregular HR appreciated, has hx of A-fib. Abdomen: Bowel sounds present, Non tender and not distended with no gaurding, rigidity or rebound. Extremities: Bilateral Lower Ext shows no edema, both legs are warm to touch with = pulse throughout Neurology:  CN II-XII grossly intact, Non focal.   Psych:  TP linear. J/I WNL. Normal speech. Appropriate eye contact and affect.  Skin: Thin skin to the top both hands. Dry skin appreciated to both hands, mostly tops. No rash, open skin, skin orsign of infection.    Data Review Lab Results  Component Value Date   HGBA1C 6.0 (A) 04/21/2022   HGBA1C 6.1 07/16/2021   HGBA1C 6.7 (H) 03/28/2021     Assessment & Plan  1) Dry skin:  - Apply acquaphor oiment TID PRN  -Use Vaseline ointment as needed in between.  - Avoid hot water to hands (use proctive gloves) or use of new chemicals.  -Report new or worsening symptoms to the clinic or local ED.  2) Atrial Fibrillation with RVR:  -Follow-up with cardiology as scheduled  -Continue taking blood thinners (Xarelto 20 mg) as prescribed  -Report new symptoms to the clinic or local ED. 3) chronic anticoagulation:  -Continue taking your Xarelto as prescribed and report any issue to your cardiologist or primary care doctor.  -Follow-up with your cardiologist and primary care provider as indicated.  -Report new or worsening symptoms to the clinic or local ED.     Patient have been counseled extensively about nutrition and exercise    The patient was given clear instructions to go to ER or return to medical center if symptoms don't improve, worsen or new problems develop. The patient verbalized understanding. The patient was told to call to  get lab results if they haven't heard anything in the next week.     Feliberto Gottron, APRN, FNP-C [facility] and [facility] [place], [place]   06/07/2022, 10:44 AM

## 2022-06-23 ENCOUNTER — Ambulatory Visit: Payer: Medicaid Other | Attending: Family Medicine

## 2022-06-23 ENCOUNTER — Ambulatory Visit: Payer: Medicaid Other

## 2022-06-23 DIAGNOSIS — Z23 Encounter for immunization: Secondary | ICD-10-CM | POA: Diagnosis not present

## 2022-06-23 NOTE — Progress Notes (Signed)
DOB confirmed  *Also requested flu vax and [place] approved her getting both*

## 2022-06-25 ENCOUNTER — Other Ambulatory Visit: Payer: Self-pay | Admitting: Cardiovascular Disease

## 2022-06-26 ENCOUNTER — Other Ambulatory Visit: Payer: Self-pay

## 2022-06-26 ENCOUNTER — Other Ambulatory Visit (HOSPITAL_COMMUNITY): Payer: Self-pay

## 2022-06-26 MED ORDER — METOPROLOL SUCCINATE ER 50 MG PO TB24
150.0000 mg | ORAL_TABLET | Freq: Every day | ORAL | 1 refills | Status: DC
  Filled 2022-06-26 – 2022-07-11 (×2): qty 90, 30d supply, fill #0
  Filled 2022-08-14: qty 90, 30d supply, fill #1

## 2022-06-27 ENCOUNTER — Other Ambulatory Visit: Payer: Self-pay

## 2022-06-30 ENCOUNTER — Other Ambulatory Visit: Payer: Self-pay

## 2022-07-11 ENCOUNTER — Other Ambulatory Visit: Payer: Self-pay

## 2022-07-11 ENCOUNTER — Other Ambulatory Visit: Payer: Self-pay | Admitting: Cardiovascular Disease

## 2022-07-11 MED ORDER — DIGOXIN 250 MCG PO TABS
0.2500 mg | ORAL_TABLET | Freq: Every day | ORAL | 2 refills | Status: DC
  Filled 2022-07-11: qty 90, 90d supply, fill #0

## 2022-07-16 ENCOUNTER — Other Ambulatory Visit: Payer: Self-pay

## 2022-07-24 ENCOUNTER — Other Ambulatory Visit: Payer: Self-pay

## 2022-07-24 ENCOUNTER — Other Ambulatory Visit: Payer: Self-pay | Admitting: Family Medicine

## 2022-07-24 ENCOUNTER — Other Ambulatory Visit: Payer: Self-pay | Admitting: Cardiovascular Disease

## 2022-07-24 ENCOUNTER — Other Ambulatory Visit (HOSPITAL_COMMUNITY): Payer: Self-pay

## 2022-07-24 ENCOUNTER — Other Ambulatory Visit: Payer: Self-pay | Admitting: Nurse Practitioner

## 2022-07-24 DIAGNOSIS — E119 Type 2 diabetes mellitus without complications: Secondary | ICD-10-CM

## 2022-07-24 DIAGNOSIS — I110 Hypertensive heart disease with heart failure: Secondary | ICD-10-CM

## 2022-07-24 MED ORDER — FUROSEMIDE 20 MG PO TABS
40.0000 mg | ORAL_TABLET | Freq: Every day | ORAL | 0 refills | Status: DC
  Filled 2022-07-24 – 2022-08-14 (×3): qty 60, 30d supply, fill #0

## 2022-07-24 NOTE — Telephone Encounter (Signed)
Requested medications are due for refill today.  yes  Requested medications are on the active medications list.  yes  Last refill. 07/16/2021 #60 3 rf  Future visit scheduled.   yes  Notes to clinic.  Missing lab.    Requested Prescriptions  Pending Prescriptions Disp Refills   furosemide (LASIX) 20 MG tablet 60 tablet 3    Sig: Take 2 tablets (40 mg total) by mouth daily.     Cardiovascular:  Diuretics - Loop Failed - 07/24/2022  2:32 PM      Failed - Na in normal range and within 180 days    Sodium  Date Value Ref Range Status  05/08/2022 145 (H) 134 - 144 mmol/L Final         Failed - Mg Level in normal range and within 180 days    Magnesium  Date Value Ref Range Status  04/04/2021 2.0 1.7 - 2.4 mg/dL Final    Comment:    Performed at [facility], [facility] [address] [place], [place] [address]         Passed - K in normal range and within 180 days    Potassium  Date Value Ref Range Status  05/08/2022 4.5 3.5 - 5.2 mmol/L Final         Passed - Ca in normal range and within 180 days    Calcium  Date Value Ref Range Status  05/08/2022 9.7 8.7 - 10.2 mg/dL Final   Calcium, Ion  Date Value Ref Range Status  11/21/2021 1.16 1.15 - 1.40 mmol/L Final         Passed - Cr in normal range and within 180 days    Creat  Date Value Ref Range Status  03/07/2016 0.88 0.50 - 1.05 mg/dL Final    Comment:      For patients > or = 58 years of age: The upper reference limit for Creatinine is approximately 13% higher for people identified as African-American.      Creatinine, Ser  Date Value Ref Range Status  05/08/2022 0.95 0.57 - 1.00 mg/dL Final         Passed - Cl in normal range and within 180 days    Chloride  Date Value Ref Range Status  05/08/2022 106 96 - 106 mmol/L Final         Passed - Last BP in normal range    BP Readings from Last 1 Encounters:  06/07/22 131/87         Passed - Valid encounter within last 6 months    Recent Outpatient Visits            1 month ago Dry skin   [facility] [facility], Elizabeth John, FNP   3 months ago Type 2 diabetes mellitus without complication, without long-term current use of insulin ([place])   Murphy, Enobong, MD   9 months ago Other headache syndrome   Murphy Elizabeth, [place], [place]   1 year ago Type 2 diabetes mellitus without complication, without long-term current use of insulin ([place])   Murphy, Elizabeth Ferretti, MD   1 year ago Type 2 diabetes mellitus without complication, without long-term current use of insulin [facility])   [facility] at [facility], Elizabeth Basque, NP       Future Appointments             In  3 weeks Elizabeth Solo Dionne Bucy, PA-C [place]   In 3 months Elizabeth Rakes, MD [place]

## 2022-07-25 ENCOUNTER — Other Ambulatory Visit: Payer: Self-pay

## 2022-07-25 ENCOUNTER — Encounter (HOSPITAL_COMMUNITY): Payer: Self-pay

## 2022-07-25 ENCOUNTER — Emergency Department (HOSPITAL_COMMUNITY)
Admission: EM | Admit: 2022-07-25 | Discharge: 2022-07-25 | Disposition: A | Discharge status: Home / Self Care | Payer: Medicaid Other | Attending: Emergency Medicine | Admitting: Emergency Medicine

## 2022-07-25 DIAGNOSIS — R2233 Localized swelling, mass and lump, upper limb, bilateral: Secondary | ICD-10-CM

## 2022-07-25 DIAGNOSIS — Z7901 Long term (current) use of anticoagulants: Secondary | ICD-10-CM | POA: Diagnosis not present

## 2022-07-25 DIAGNOSIS — M7989 Other specified soft tissue disorders: Secondary | ICD-10-CM | POA: Diagnosis not present

## 2022-07-25 MED ORDER — ATORVASTATIN CALCIUM 80 MG PO TABS
80.0000 mg | ORAL_TABLET | Freq: Every day | ORAL | 2 refills | Status: DC
  Filled 2022-07-25 – 2022-08-19 (×4): qty 30, 30d supply, fill #0

## 2022-07-25 MED ORDER — LOSARTAN POTASSIUM 25 MG PO TABS
12.5000 mg | ORAL_TABLET | Freq: Every day | ORAL | 3 refills | Status: DC
  Filled 2022-07-25 – 2022-07-28 (×3): qty 45, 90d supply, fill #0

## 2022-07-25 NOTE — ED Provider Notes (Signed)
[facility] EMERGENCY DEPARTMENT Provider Note   CSN: [id number] Arrival date & time: 07/25/22  1330     History  Chief Complaint  Patient presents with   knots on hands    [name] is a 56 y.o. female presents to ER from home complaining of 2 small knots on her hands and a small knot on the back of her head.  She states they have not changed since she first noticed them and that they are not painful or warm.  Denies fever, chills, numbness, tingling, painful joints.  She also expresses concerns about a possible blackhead or pimple in her right ear.     Home Medications Prior to Admission medications   Medication Sig Start Date End Date Taking? Authorizing Provider  Accu-Chek FastClix Lancets MISC USE AS INSTRUCTED TO CHECK BLOOD SUGAR ONCE DAILY. 06/14/21   Fenton Foy, NP  acetaminophen (TYLENOL) 500 MG tablet Take 1,000 mg by mouth every 6 (six) hours as needed for headache.     [provider]  atorvastatin (LIPITOR) 80 MG tablet Take 1 tablet (80 mg total) by mouth daily at 6 PM. 07/25/22   Croitoru, Mihai, MD  Blood Glucose Monitoring Suppl (ACCU-CHEK GUIDE ME) w/Device KIT 1 kit by Does not apply route daily. 05/02/19   [name], MD  dapagliflozin propanediol (FARXIGA) 10 MG TABS tablet Take 1 tablet by mouth daily. Need office visit for refills. 07/10/21   Croitoru, Mihai, MD  digoxin (LANOXIN) 0.25 MG tablet Take 1 tablet (0.25 mg total) by mouth daily. 07/11/22   Croitoru, Mihai, MD  furosemide (LASIX) 20 MG tablet Take 2 tablets (40 mg total) by mouth daily. 07/24/22   [name], MD  gabapentin (NEURONTIN) 300 MG capsule TAKE 1 CAPSULE (300 MG TOTAL) BY MOUTH 2 (TWO) TIMES DAILY. 10/07/21 10/07/22  [name], MD  glucose blood (ACCU-CHEK GUIDE) test strip Use as instructed 06/14/21   Fenton Foy, NP  losartan (COZAAR) 25 MG tablet Take 1/2 tablet (12.5 mg total) by mouth daily. 07/25/22 10/23/22  Croitoru, Mihai, MD   metoprolol succinate (TOPROL-XL) 50 MG 24 hr tablet Take 3 tablets (150 mg total) by mouth daily. 06/26/22   Croitoru, Dani Gobble, MD  Misc. Devices MISC Blood pressure monitor.  Diagnosis Hypertension 02/11/19   [name], MD  rivaroxaban (XARELTO) 20 MG TABS tablet TAKE 1 TABLET (20 MG TOTAL) BY MOUTH DAILY WITH SUPPER. 03/25/22 03/25/23  Croitoru, Mihai, MD  spironolactone (ALDACTONE) 25 MG tablet Take 0.5 tablets (12.5 mg total) by mouth daily. 07/10/21   Croitoru, Mihai, MD  albuterol (PROVENTIL HFA;VENTOLIN HFA) 108 (90 Base) MCG/ACT inhaler Inhale 2 puffs into the lungs every 6 (six) hours as needed for wheezing or shortness of breath. Patient not taking: Reported on 03/28/2021 11/17/18 03/28/21  [name], MD  fluticasone (FLONASE) 50 MCG/ACT nasal spray Place 2 sprays into both nostrils daily. Patient not taking: Reported on 03/28/2021 12/14/18 03/28/21  [name] Stain, MD      Allergies    Penicillins    Review of Systems   Review of Systems  Constitutional:  Negative for fever.  Musculoskeletal:  Negative for arthralgias, joint swelling and myalgias.  Skin:  Negative for color change.  Neurological:  Negative for tremors, weakness and numbness.    Physical Exam Updated Vital Signs BP (!) 158/101   Pulse (!) 104   Temp 98.2 F (36.8 C) (Oral)   Resp 16   Ht 5' 4" (1.626 m)  Wt 85.7 kg   SpO2 100%   BMI 32.44 kg/m  Physical Exam Vitals and nursing note reviewed.  Constitutional:      General: She is not in acute distress.    Appearance: Normal appearance. She is not ill-appearing or diaphoretic.  HENT:     Head: Normocephalic. No abrasion, contusion or masses.     Ears:     Comments: Comedones in right ear canal, no pain Pulmonary:     Effort: Pulmonary effort is normal.  Musculoskeletal:     Right wrist: Normal. No swelling, deformity, tenderness or bony tenderness.     Left wrist: Normal. No swelling, deformity, tenderness or bony tenderness.     Right  hand: Normal. No deformity, tenderness or bony tenderness. Normal strength. Normal sensation.     Left hand: Normal. No deformity, tenderness or bony tenderness. Normal strength. Normal sensation.  Neurological:     Mental Status: She is alert. Mental status is at baseline.  Psychiatric:        Mood and Affect: Mood normal.        Behavior: Behavior normal.     ED Results / Procedures / Treatments   Labs (all labs ordered are listed, but only abnormal results are displayed) Labs Reviewed - No data to display  EKG None  Radiology No results found.  Procedures Procedures    Medications Ordered in ED Medications - No data to display  ED Course/ Medical Decision Making/ A&P                           Medical Decision Making  Patient presents to the ER with concerns of "knots and bumps" on both of her hands near the thumbs as well as one on the back of her head.  She also expresses concern about a blackhead into her ear but is not complaining of pain.  She has not experienced any fever, warmth, or changes in these areas since she first noticed them.  On exam, I am unable to feel any deformities or abnormalities in the areas that she is pointing to.  On bilateral hands, she is indicating an area at the base of her thumb around the thenar eminence as well as the PIP joint.  Grip strength is equal bilaterally.  Motor and sensation are intact.  There is no warmth or swelling over these joints.  No mass or abnormality appreciated on exam of the occiput where patient is indicating there is a lump.  No increased warmth or redness to this area.  She does have open and closed comedones inside of her right ear canal, no redness or drainage appreciated.  Patient does not have pain on palpation of any of the areas she is concerned about.  Discussed with patient findings and that I do not feel imaging or blood work is required at this time.  Based on physical exam, I am unable to identify  abnormalities. Patient may be feeling chronic changes to underlying bony structures and I feel this would best be monitored by her primary care.  Discussed with patient signs that warrant further work up such as increased warmth around joints, pain, swelling, and fever.  Patient states she feels reassured and has an appointment already scheduled with her PCP later this month.  Return precautions given.          Final Clinical Impression(s) / ED Diagnoses Final diagnoses:  Lump on finger of both hands  Rx / DC Orders ED Discharge Orders     None         Clark, Meghan R, PA 07/25/22 1835    Haviland, Julie, MD 07/25/22 2207  

## 2022-07-25 NOTE — ED Provider Triage Note (Signed)
Emergency Medicine Provider Triage Evaluation Note  [name] , a 56 y.o. female  was evaluated in triage.  Pt complains of "knots" on the back of her head, and both of her hands near her wrists/thumbs.  She states they came out of nowhere, are not associated with injury, and she has never had this problem before.  Denies osteoarthritis and rheumatoid arthritis.  Denies redness, fever, chills, pain.   Review of Systems  Positive: See above Negative: See above  Physical Exam  BP (!) 158/101   Pulse (!) 104   Temp 98.2 F (36.8 C) (Oral)   Resp 16   Ht '5\' 4"'$  (1.626 m)   Wt 85.7 kg   SpO2 100%   BMI 32.44 kg/m  Gen:   Awake, no distress   Resp:  Normal effort  MSK:   Moves extremities without difficulty, no gross abnormality on exam, ROM of wrists normal, no redness, fluctuance, or wound   Medical Decision Making  Medically screening exam initiated at 2:13 PM.  Appropriate orders placed.  [name] was informed that the remainder of the evaluation will be completed by another provider, this initial triage assessment does not replace that evaluation, and the importance of remaining in the ED until their evaluation is complete.     Theressa Stamps R, [place] 07/25/22 1415

## 2022-07-25 NOTE — Discharge Instructions (Signed)
You were seen in the ER for concern of knots on your hands and the back of your head.  You do not have signs of an infection and I do not feel that imaging or blood work is necessary at this time.  It is reassuring that you do not have pain and are able to use your hands appropriately.  Please monitor these areas for changes such as redness, swelling, increased warmth, or if you develop fevers.  I recommend keeping your appointment with your primary care on 08/14/22 to follow-up with them.  As we discussed, there may be chronic changes to your hands that may be causing your symptoms.   Return to the ER if you develop new or worsening symptoms.

## 2022-07-25 NOTE — ED Triage Notes (Signed)
Pt arrived POV from home c/o 2 small knots on her hands and then a small knot on the back of her head. She is unsure what is wrong but states something must be.

## 2022-07-28 ENCOUNTER — Other Ambulatory Visit (HOSPITAL_COMMUNITY): Payer: Self-pay

## 2022-07-28 ENCOUNTER — Other Ambulatory Visit: Payer: Self-pay

## 2022-07-30 ENCOUNTER — Other Ambulatory Visit (HOSPITAL_COMMUNITY): Payer: Self-pay

## 2022-07-31 ENCOUNTER — Other Ambulatory Visit (HOSPITAL_COMMUNITY): Payer: Self-pay

## 2022-08-13 NOTE — Progress Notes (Signed)
Patient ID: [name], female   DOB: Jul 14, 1966, 56 y.o.   MRN: [id number]     [name], is a 56 y.o. female  HFG:[id number]  [id number]  DOB - Feb 02, 1966  Chief Complaint  Patient presents with   Mass   Constipation   Medication Refill       Subjective:   [name] is a 56 y.o. female here today for a follow up visit After being seen at ED 07/25/2022 for lumps on her B thumbs and a lump on her scalp.  She did not take BP meds today and just came from work so stressed.  No HA/HA/dizziness.  She says BP OOO is usu 120-130/80.    Urine is sometimes clear and sometimes gold-looking.  No pain with urination but she says it does have an odor.  +vaginal odor without d/c.  No pelvic pain or fever.   Also having R ear tenderness No problems updated.  ALLERGIES: Allergies  Allergen Reactions   Penicillins Hives and Other (See Comments)    Unknown  Has patient had a PCN reaction causing immediate rash, facial/tongue/throat swelling, SOB or lightheadedness with hypotension: No Has patient had a PCN reaction causing severe rash involving mucus membranes or skin necrosis: No Has patient had a PCN reaction that required hospitalization No Has patient had a PCN reaction occurring within the last 10 years: No If all of the above answers are "NO", then may proceed with Cephalosporin use.    PAST MEDICAL HISTORY: Past Medical History:  Diagnosis Date   Clotting disorder ([place])    on xarelto   Essential hypertension    Heart murmur    a. 10/2015 Echo: EF 60-65%, no rwma, mild AI/MR, sev dil LA/RA, PASP 55mHg.   Noncompliance    NSVT (nonsustained ventricular tachycardia) ([facility]    a. 10/2015 during admission for CVA/AF.   Persistent atrial fibrillation ([facility]    a. CHA2DS2VASC = 4-->Xarelto;  b. 02/2016 Successful DCCV.  c. Recurrent fib   Pneumonia 2012 x 2; 2015   Stroke ([facility]    a. 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   Transient ischemic attack (TIA) 01/2013    MEDICATIONS AT HOME: Prior to Admission medications   Medication Sig Start Date End Date Taking? Authorizing Provider  acetaminophen (TYLENOL) 500 MG tablet Take 1,000 mg by mouth every 6 (six) hours as needed for headache.    Yes [provider]  atorvastatin (LIPITOR) 80 MG tablet Take 1 tablet (80 mg total) by mouth daily at 6 PM. 07/25/22  Yes Croitoru, Mihai, MD  Blood Glucose Monitoring Suppl (ACCU-CHEK GUIDE ME) w/Device KIT 1 kit by Does not apply route daily. 05/02/19  Yes NCharlott Rakes MD  dapagliflozin propanediol (FARXIGA) 10 MG TABS tablet Take 1 tablet by mouth daily. Need office visit for refills. 07/10/21  Yes Croitoru, Mihai, MD  digoxin (LANOXIN) 0.25 MG tablet Take 1 tablet (0.25 mg total) by mouth daily. 07/11/22  Yes Croitoru, Mihai, MD  doxycycline (VIBRA-TABS) 100 MG tablet Take 1 tablet (100 mg total) by mouth 2 (two) times daily. 08/14/22  Yes MFreeman CaldronM, PA-C  fluconazole (DIFLUCAN) 150 MG tablet Take 1 tablet (150 mg total) by mouth once for 1 dose. If needed for yeast 08/14/22 08/15/22 Yes Emmaly Leech, ADionne Bucy PA-C  furosemide (LASIX) 20 MG tablet Take 2 tablets (40 mg total) by mouth daily. 07/24/22  Yes NCharlott Rakes MD  gabapentin (  NEURONTIN) 300 MG capsule TAKE 1 CAPSULE (300 MG TOTAL) BY MOUTH 2 (TWO) TIMES DAILY. 10/07/21 10/07/22 Yes [name], MD  losartan (COZAAR) 25 MG tablet Take 1/2 tablet (12.5 mg total) by mouth daily. 07/25/22 10/23/22 Yes Croitoru, Mihai, MD  metoprolol succinate (TOPROL-XL) 50 MG 24 hr tablet Take 3 tablets (150 mg total) by mouth daily. 06/26/22  Yes Croitoru, Dani Gobble, MD  Misc. Devices MISC Blood pressure monitor.  Diagnosis Hypertension 02/11/19  Yes [name], MD  mupirocin ointment (BACTROBAN) 2 % Apply 1 Application topically 2 (two) times daily. 08/14/22  Yes Rowan Pollman M, PA-C  rivaroxaban (XARELTO) 20 MG TABS tablet  TAKE 1 TABLET (20 MG TOTAL) BY MOUTH DAILY WITH SUPPER. 03/25/22 03/25/23 Yes Croitoru, Mihai, MD  spironolactone (ALDACTONE) 25 MG tablet Take 0.5 tablets (12.5 mg total) by mouth daily. 07/10/21  Yes Croitoru, Mihai, MD  Accu-Chek FastClix Lancets MISC USE AS INSTRUCTED TO CHECK BLOOD SUGAR ONCE DAILY. 08/14/22   Argentina Donovan, PA-C  glucose blood (ACCU-CHEK GUIDE) test strip Use as instructed 08/14/22   Argentina Donovan, PA-C  albuterol (PROVENTIL HFA;VENTOLIN HFA) 108 (90 Base) MCG/ACT inhaler Inhale 2 puffs into the lungs every 6 (six) hours as needed for wheezing or shortness of breath. Patient not taking: Reported on 03/28/2021 11/17/18 03/28/21  [name], MD  fluticasone (FLONASE) 50 MCG/ACT nasal spray Place 2 sprays into both nostrils daily. Patient not taking: Reported on 03/28/2021 12/14/18 03/28/21  [name] Stain, MD    ROS: Neg HEENT Neg resp Neg cardiac Neg GI Neg psych Neg neuro  Objective:   Vitals:   08/14/22 1431 08/14/22 1517  BP: (!) 160/110 (!) 147/108  Pulse: 96   SpO2: 95% 99%  Weight: 196 lb 3.2 oz (89 kg)    Exam General appearance : Awake, alert, not in any distress. Speech Clear. Not toxic looking HEENT: Atraumatic and Normocephalic.  R canal at the entrance, there is a black head and a pimple that is tender.  Canal  and TM WNL Neck: Supple, no JVD. No cervical lymphadenopathy.  Chest: Good air entry bilaterally, CTAB.  No rales/rhonchi/wheezing CVS: S1 S2 regular, no murmurs.  R vs L thumbs examined-I am unable to appreciate any discrete lumps.  Her R thenar eminence is slightly larger than the L.  There is no redness of either hand. ROM is normal Extremities: B/L Lower Ext shows no edema, both legs are warm to touch Neurology: Awake alert, and oriented X 3, CN II-XII intact, Non focal Skin: No Rash Prominent occipital bone with ~2cm lipoma overlying the area  Data Review Lab Results  Component Value Date   HGBA1C 6.0 (A) 04/21/2022    HGBA1C 6.1 07/16/2021   HGBA1C 6.7 (H) 03/28/2021    Assessment & Plan   1. Vaginal discharge - Cervicovaginal ancillary only  2. Type 2 diabetes mellitus without complication, without long-term current use of insulin (HCC) Last A1C=6.0-continue current regimen.  She says she does not need any medication RF at this time.  BP controlled based on OOO readings - Accu-Chek FastClix Lancets MISC; USE AS INSTRUCTED TO CHECK BLOOD SUGAR ONCE DAILY.  Dispense: 102 each; Refill: 0 - glucose blood (ACCU-CHEK GUIDE) test strip; Use as instructed  Dispense: 100 strip; Refill: 0  3. Acute cystitis without hematuria - POCT URINALYSIS DIP (CLINITEK) - Urine Culture - doxycycline (VIBRA-TABS) 100 MG tablet; Take 1 tablet (100 mg total) by mouth 2 (two) times daily.  Dispense: 14 tablet; Refill: 0 -  fluconazole (DIFLUCAN) 150 MG tablet; Take 1 tablet (150 mg total) by mouth once for 1 dose. If needed for yeast  Dispense: 1 tablet; Refill: 0  4. Pain of both thumbs - DG Finger Thumb Left; Future - DG Finger Thumb Right; Future  5. Lipoma of scalp Watch for increase in sized or pain  6. Encounter for examination following treatment at hospital  7. Skin pimple in R ear entrance - mupirocin ointment (BACTROBAN) 2 %; Apply 1 Application topically 2 (two) times daily.  Dispense: 22 g; Refill: 0    Return in about 3 months (around 11/13/2022) for PCP for chronic conditions.  The patient was given clear instructions to go to ER or return to medical center if symptoms don't improve, worsen or new problems develop. The patient verbalized understanding. The patient was told to call to get lab results if they haven't heard anything in the next week.      Freeman Caldron, PA-C [facility] [place], [place]   08/14/2022, 3:18 PM

## 2022-08-14 ENCOUNTER — Ambulatory Visit: Payer: Medicaid Other | Attending: Physician Assistant | Admitting: Physician Assistant

## 2022-08-14 ENCOUNTER — Other Ambulatory Visit: Payer: Self-pay

## 2022-08-14 ENCOUNTER — Other Ambulatory Visit (HOSPITAL_COMMUNITY)
Admission: RE | Admit: 2022-08-14 | Discharge: 2022-08-14 | Disposition: A | Discharge status: Home / Self Care | Payer: Medicaid Other | Source: Ambulatory Visit | Attending: Physician Assistant | Admitting: Physician Assistant

## 2022-08-14 ENCOUNTER — Encounter: Payer: Self-pay | Admitting: Physician Assistant

## 2022-08-14 VITALS — BP 147/108 | HR 96 | Wt 196.2 lb

## 2022-08-14 DIAGNOSIS — M79644 Pain in right finger(s): Secondary | ICD-10-CM | POA: Diagnosis not present

## 2022-08-14 DIAGNOSIS — D170 Benign lipomatous neoplasm of skin and subcutaneous tissue of head, face and neck: Secondary | ICD-10-CM

## 2022-08-14 DIAGNOSIS — E119 Type 2 diabetes mellitus without complications: Secondary | ICD-10-CM | POA: Diagnosis not present

## 2022-08-14 DIAGNOSIS — R238 Other skin changes: Secondary | ICD-10-CM

## 2022-08-14 DIAGNOSIS — N898 Other specified noninflammatory disorders of vagina: Secondary | ICD-10-CM

## 2022-08-14 DIAGNOSIS — Z09 Encounter for follow-up examination after completed treatment for conditions other than malignant neoplasm: Secondary | ICD-10-CM | POA: Diagnosis not present

## 2022-08-14 DIAGNOSIS — N3000 Acute cystitis without hematuria: Secondary | ICD-10-CM | POA: Diagnosis not present

## 2022-08-14 DIAGNOSIS — M79645 Pain in left finger(s): Secondary | ICD-10-CM

## 2022-08-14 MED ORDER — ACCU-CHEK FASTCLIX LANCETS MISC
0 refills | Status: DC
  Filled 2022-08-14: qty 102, 90d supply, fill #0

## 2022-08-14 MED ORDER — DOXYCYCLINE HYCLATE 100 MG PO TABS
100.0000 mg | ORAL_TABLET | Freq: Two times a day (BID) | ORAL | 0 refills | Status: DC
  Filled 2022-08-14: qty 14, 7d supply, fill #0

## 2022-08-14 MED ORDER — MUPIROCIN 2 % EX OINT
1.0000 | TOPICAL_OINTMENT | Freq: Two times a day (BID) | CUTANEOUS | 0 refills | Status: DC
  Filled 2022-08-14: qty 22, 11d supply, fill #0

## 2022-08-14 MED ORDER — FLUCONAZOLE 150 MG PO TABS
150.0000 mg | ORAL_TABLET | Freq: Once | ORAL | 0 refills | Status: AC
  Filled 2022-08-14: qty 1, 1d supply, fill #0

## 2022-08-14 MED ORDER — ACCU-CHEK GUIDE VI STRP
ORAL_STRIP | 0 refills | Status: DC
  Filled 2022-08-14: qty 50, 50d supply, fill #0

## 2022-08-14 NOTE — Patient Instructions (Addendum)
Lipoma  A lipoma is a noncancerous (benign) tumor that is made up of fat cells. This is a very common type of soft-tissue growth. Lipomas are usually found under the skin (subcutaneous). They may occur in any tissue of the body that contains fat. Common areas for lipomas to appear include the back, arms, shoulders, buttocks, and thighs. Lipomas grow slowly, and they are usually painless. Most lipomas do not cause problems and do not require treatment. What are the causes? The cause of this condition is not known. What increases the risk? You are more likely to develop this condition if: You are 38-72 years old. You have a family history of lipomas. What are the signs or symptoms? A lipoma usually appears as a small, round bump under the skin. In most cases, the lump will: Feel soft or rubbery. Not cause pain or other symptoms. However, if a lipoma is located in an area where it pushes on nerves, it can become painful or cause other symptoms. How is this diagnosed? A lipoma can usually be diagnosed with a physical exam. You may also have tests to confirm the diagnosis and to rule out other conditions. Tests may include: Imaging tests, such as a CT scan or an MRI. Removal of a tissue sample to be looked at under a microscope (biopsy). How is this treated? Treatment for this condition depends on the size of the lipoma and whether it is causing any symptoms. For small lipomas that are not causing problems, no treatment is needed. If a lipoma is bigger or it causes problems, surgery may be done to remove the lipoma. Lipomas can also be removed to improve appearance. Most often, the procedure is done after applying a medicine that numbs the area (local anesthetic). Liposuction may be done to reduce the size of the lipoma before it is removed through surgery, or it may be done to remove the lipoma. Lipomas are removed with this method to limit incision size and scarring. A liposuction tube is  inserted through a small incision into the lipoma, and the contents of the lipoma are removed through the tube with suction. Follow these instructions at home: Watch your lipoma for any changes. Keep all follow-up visits. This is important. Where to find more information OrthoInfo: orthoinfo.aaos.org Contact a health care provider if: Your lipoma becomes larger or hard. Your lipoma becomes painful, red, or increasingly swollen. These could be signs of infection or a more serious condition. Get help right away if: You develop tingling or numbness in an area near the lipoma. This could indicate that the lipoma is causing nerve damage. Summary A lipoma is a noncancerous tumor that is made up of fat cells. Most lipomas do not cause problems and do not require treatment. If a lipoma is bigger or it causes problems, surgery may be done to remove the lipoma. Contact a health care provider if your lipoma becomes larger or hard, or if it becomes painful, red, or increasingly swollen. These could be signs of infection or a more serious condition. This information is not intended to replace advice given to you by your health care provider. Make sure you discuss any questions you have with your health care provider. Document Revised: 09/20/2021 Document Reviewed: 09/20/2021 Elsevier Patient Education  [place]. Urinary Tract Infection, Adult  A urinary tract infection (UTI) is an infection of any part of the urinary tract. The urinary tract includes the kidneys, ureters, bladder, and urethra. These organs make, store, and get rid  of urine in the body. An upper UTI affects the ureters and kidneys. A lower UTI affects the bladder and urethra. What are the causes? Most urinary tract infections are caused by bacteria in your genital area around your urethra, where urine leaves your body. These bacteria grow and cause inflammation of your urinary tract. What increases the risk? You are more likely  to develop this condition if: You have a urinary catheter that stays in place. You are not able to control when you urinate or have a bowel movement (incontinence). You are female and you: Use a spermicide or diaphragm for birth control. Have low estrogen levels. Are pregnant. You have certain genes that increase your risk. You are sexually active. You take antibiotic medicines. You have a condition that causes your flow of urine to slow down, such as: An enlarged prostate, if you are female. Blockage in your urethra. A kidney stone. A nerve condition that affects your bladder control (neurogenic bladder). Not getting enough to drink, or not urinating often. You have certain medical conditions, such as: Diabetes. A weak disease-fighting system (immunesystem). Sickle cell disease. Gout. Spinal cord injury. What are the signs or symptoms? Symptoms of this condition include: Needing to urinate right away (urgency). Frequent urination. This may include small amounts of urine each time you urinate. Pain or burning with urination. Blood in the urine. Urine that smells bad or unusual. Trouble urinating. Cloudy urine. Vaginal discharge, if you are female. Pain in the abdomen or the lower back. You may also have: Vomiting or a decreased appetite. Confusion. Irritability or tiredness. A fever or chills. Diarrhea. The first symptom in older adults may be confusion. In some cases, they may not have any symptoms until the infection has worsened. How is this diagnosed? This condition is diagnosed based on your medical history and a physical exam. You may also have other tests, including: Urine tests. Blood tests. Tests for STIs (sexually transmitted infections). If you have had more than one UTI, a cystoscopy or imaging studies may be done to determine the cause of the infections. How is this treated? Treatment for this condition includes: Antibiotic medicine. Over-the-counter  medicines to treat discomfort. Drinking enough water to stay hydrated. If you have frequent infections or have other conditions such as a kidney stone, you may need to see a health care provider who specializes in the urinary tract (urologist). In rare cases, urinary tract infections can cause sepsis. Sepsis is a life-threatening condition that occurs when the body responds to an infection. Sepsis is treated in the hospital with IV antibiotics, fluids, and other medicines. Follow these instructions at home:  Medicines Take over-the-counter and prescription medicines only as told by your health care provider. If you were prescribed an antibiotic medicine, take it as told by your health care provider. Do not stop using the antibiotic even if you start to feel better. General instructions Make sure you: Empty your bladder often and completely. Do not hold urine for long periods of time. Empty your bladder after sex. Wipe from front to back after urinating or having a bowel movement if you are female. Use each tissue only one time when you wipe. Drink enough fluid to keep your urine pale yellow. Keep all follow-up visits. This is important. Contact a health care provider if: Your symptoms do not get better after 1-2 days. Your symptoms go away and then return. Get help right away if: You have severe pain in your back or your lower abdomen. You  have a fever or chills. You have nausea or vomiting. Summary A urinary tract infection (UTI) is an infection of any part of the urinary tract, which includes the kidneys, ureters, bladder, and urethra. Most urinary tract infections are caused by bacteria in your genital area. Treatment for this condition often includes antibiotic medicines. If you were prescribed an antibiotic medicine, take it as told by your health care provider. Do not stop using the antibiotic even if you start to feel better. Keep all follow-up visits. This is important. This  information is not intended to replace advice given to you by your health care provider. Make sure you discuss any questions you have with your health care provider. Document Revised: 04/13/2020 Document Reviewed: 04/13/2020 Elsevier Patient Education  [place].

## 2022-08-15 ENCOUNTER — Other Ambulatory Visit: Payer: Self-pay

## 2022-08-15 ENCOUNTER — Ambulatory Visit: Payer: Self-pay

## 2022-08-15 ENCOUNTER — Telehealth: Payer: Self-pay | Admitting: Family Medicine

## 2022-08-15 LAB — POCT URINALYSIS DIP (CLINITEK)
Bilirubin, UA: NEGATIVE
Glucose, UA: NEGATIVE mg/dL
Ketones, POC UA: NEGATIVE mg/dL
Nitrite, UA: POSITIVE — AB
POC PROTEIN,UA: 30 — AB
Spec Grav, UA: >=1.03 — AB (ref 1.010–1.025)
Urobilinogen, UA: 0.2 E.U./dL
pH, UA: 6 (ref 5.0–8.0)

## 2022-08-15 LAB — CERVICOVAGINAL ANCILLARY ONLY
Bacterial Vaginitis (gardnerella): POSITIVE — AB
Candida Glabrata: NEGATIVE
Candida Vaginitis: NEGATIVE
Chlamydia: NEGATIVE
Comment: NEGATIVE
Comment: NEGATIVE
Comment: NEGATIVE
Comment: NEGATIVE
Comment: NEGATIVE
Comment: NORMAL
Neisseria Gonorrhea: NEGATIVE
Trichomonas: NEGATIVE

## 2022-08-15 NOTE — Telephone Encounter (Signed)
  Chief Complaint: Back pain/side pain Symptoms: Pain when touched right side by waist. Frequency: 2.5 weeks Pertinent Negatives: Patient denies Fever, neurological issues Disposition: '[]'$ ED /'[]'$ Urgent Care (no appt availability in office) / '[x]'$ Appointment(In office/virtual)/ '[]'$  [place] [facility]/ '[]'$ Home Care/ '[]'$ Refused Recommended Disposition /'[]'$ [place] [facility]/ '[]'$  Follow-up with PCP Additional Notes: Pt has several issues that need addressing.   Pt was unable to have xray done after her appt. Pt needs to know if she needs a new referral or an appt. For the xrays.  Pt did not go to work on Monday 08/11/2022 and would like a note to take to work for being out. She took off because of the knot on her head.   Pt will be back for appointment on Monday for  back/side pain that she forgot to tell Dr. Margarita Rana about at recent [place].  PT states that pain seems to be muscle in nature. Pt reports pain of 8/10 when touched.      Summary: Pt requested to speak with a nurse. Pt declined to provide any other details.   Pt requested to speak with a nurse. Pt declined to provide any other details. Cb# [phone]       Reason for Disposition  [1] MODERATE back pain (e.g., interferes with normal activities) AND [2] present > 3 days  Answer Assessment - Initial Assessment Questions 1. ONSET: "When did the pain begin?"      2.5 weeks 2. LOCATION: "Where does it hurt?" (upper, mid or lower back)     Rt lower back side at waist 3. SEVERITY: "How bad is the pain?"  (e.g., Scale 1-10; mild, moderate, or severe)   - MILD (1-3): Doesn't interfere with normal activities.    - MODERATE (4-7): Interferes with normal activities or awakens from sleep.    - SEVERE (8-10): Excruciating pain, unable to do any normal activities.      8/10 4. PATTERN: "Is the pain constant?" (e.g., yes, no; constant, intermittent)      Constant when laying on right side 5. RADIATION: "Does the pain shoot into your legs or  somewhere else?"     no 6. CAUSE:  "What do you think is causing the back pain?"      unsure 7. BACK OVERUSE:  "Any recent lifting of heavy objects, strenuous work or exercise?"     no 8. MEDICINES: "What have you taken so far for the pain?" (e.g., nothing, acetaminophen, NSAIDS)     Tylenol - It helps 9. NEUROLOGIC SYMPTOMS: "Do you have any weakness, numbness, or problems with bowel/bladder control?"     Always constipated 10. OTHER SYMPTOMS: "Do you have any other symptoms?" (e.g., fever, abdomen pain, burning with urination, blood in urine)       no 11. PREGNANCY: "Is there any chance you are pregnant?" "When was your last menstrual period?"  Protocols used: Back Pain-A-AH

## 2022-08-15 NOTE — Telephone Encounter (Signed)
Summary: Pt requested to speak with a nurse. Pt declined to provide any other details.   Pt requested to speak with a nurse. Pt declined to provide any other details. Cb# [phone]      Called pt - unable to [facility] Mail box is full.

## 2022-08-15 NOTE — Telephone Encounter (Signed)
Copied from [id number]. Topic: General - Other >> Aug 15, 2022  4:27 PM Ja-Kwan M wrote: Reason for CRM: Pt requests that the order for imaging be resubmitted because she was not able to get the imaging done yesterday due to not having her identification.

## 2022-08-18 ENCOUNTER — Other Ambulatory Visit: Payer: Self-pay

## 2022-08-18 ENCOUNTER — Ambulatory Visit: Payer: Medicaid Other | Attending: Family Medicine | Admitting: Family Medicine

## 2022-08-18 ENCOUNTER — Encounter: Payer: Self-pay | Admitting: Family Medicine

## 2022-08-18 VITALS — BP 145/80 | HR 92 | Ht 64.0 in | Wt 200.0 lb

## 2022-08-18 DIAGNOSIS — M19049 Primary osteoarthritis, unspecified hand: Secondary | ICD-10-CM

## 2022-08-18 DIAGNOSIS — E1159 Type 2 diabetes mellitus with other circulatory complications: Secondary | ICD-10-CM | POA: Diagnosis not present

## 2022-08-18 DIAGNOSIS — I152 Hypertension secondary to endocrine disorders: Secondary | ICD-10-CM | POA: Diagnosis not present

## 2022-08-18 DIAGNOSIS — R220 Localized swelling, mass and lump, head: Secondary | ICD-10-CM | POA: Diagnosis not present

## 2022-08-18 DIAGNOSIS — M940 Chondrocostal junction syndrome [Tietze]: Secondary | ICD-10-CM | POA: Diagnosis not present

## 2022-08-18 MED ORDER — SPIRONOLACTONE 25 MG PO TABS
25.0000 mg | ORAL_TABLET | Freq: Every day | ORAL | 1 refills | Status: DC
  Filled 2022-08-18: qty 90, 90d supply, fill #0

## 2022-08-18 NOTE — Patient Instructions (Signed)
Costochondritis  Costochondritis is irritation and swelling (inflammation) of the tissue that connects the ribs to the breastbone (sternum). This tissue is called cartilage. This condition causes pain in the front of the chest. The pain often starts slowly. It may be in more than one rib. What are the causes? The cause of this condition is not always known. It can come from stress on the sternum. The cause of this stress could be: Chest injury. Exercise or activity. This may include lifting. Very bad coughing. What increases the risk? Being female. Being 25-28 years old. Starting a new exercise or work activity. Having low levels of vitamin D. Having a condition that makes you cough a lot. What are the signs or symptoms? Chest pain that: Starts slowly. It can be sharp or dull. Gets worse with deep breathing, coughing, or exercise. Gets better with rest. May be worse when you press on your ribs and breastbone. How is this treated? In most cases, this condition goes away on its own over time. You may need to take an NSAID, such as ibuprofen. This can help reduce pain. You may also need to: Rest and stay away from activities that make pain worse. Put heat or ice on the area that hurts. Do exercises to stretch your chest muscles. If these treatments do not help, your doctor may inject a medicine to numb the area. This can help relieve the pain. Follow these instructions at home: Managing pain, stiffness, and swelling     If told, put ice on the painful area. To do this: Put ice in a plastic bag. Place a towel between your skin and the bag. Leave the ice on for 20 minutes, 2-3 times a day. If told, put heat on the affected area. Do this as often as told by your doctor. Use the heat source that your doctor recommends, such as a moist heat pack or a heating pad. Place a towel between your skin and the heat source. Leave the heat on for 20-30 minutes. If your skin turns bright red,  take off the ice or heat right away to prevent skin damage. The risk of skin damage is higher if you cannot feel pain, heat, or cold. Activity Rest as told by your doctor. Do not do things that make your pain worse. This includes activities that use your chest, belly (abdomen), and side muscles. You may have to avoid lifting. Ask your doctor how much you can safely lift. Return to your normal activities when your doctor says that it is safe. General instructions Take over-the-counter and prescription medicines only as told by your doctor. Contact a doctor if: You have chills or a fever. Your pain does not go away or gets worse. You have a cough that does not go away. Get help right away if: You have a hard time breathing. You have very bad chest pain that does not get better with medicines, heat, or ice. These symptoms may be an emergency. Get help right away. Call 911. Do not wait to see if the symptoms will go away. Do not drive yourself to the hospital. This information is not intended to replace advice given to you by your health care provider. Make sure you discuss any questions you have with your health care provider. Document Revised: 03/20/2022 Document Reviewed: 03/20/2022 Elsevier Patient Education  [place].

## 2022-08-18 NOTE — Progress Notes (Signed)
Subjective:  Patient ID: [name], female    DOB: 01/19/1966  Age: 56 y.o. MRN: [id number]  CC: Back Pain   HPI [name] is a 56 y.o. year old female with a history of atrial fibrillation/atrial flutter (status post cardioversion in 02/2016 currently on rate control with metoprolol and Cardizem and anticoagulation Xarelto s/p DCCV in 2017 ), cerebral infarction due to embolism of right middle cerebral artery status post IV TPA (in 10/2015),  right MCA stroke in 01/2018 (after running out of Xarelto), Type 2 DM (A1c 6.0)  Seen for an acute visit today.  Interval History: She complains of a knot on the back of her head and her hand.  She had an ED visit for this last month.  Lesions are not painful.  She is unsure of duration. Also complains of  right sided back pain and points to her right posterior rib cage.  Pain is absent at rest. BP is elevated and she endorses adherence with her antihypertensive. Past Medical History:  Diagnosis Date   Clotting disorder ([place])    on xarelto   Essential hypertension    Heart murmur    a. 10/2015 Echo: EF 60-65%, no rwma, mild AI/MR, sev dil LA/RA, PASP 56mHg.   Noncompliance    NSVT (nonsustained ventricular tachycardia) ([facility]    a. 10/2015 during admission for CVA/AF.   Persistent atrial fibrillation ([place]    a. CHA2DS2VASC = 4-->Xarelto;  b. 02/2016 Successful DCCV.  c. Recurrent fib   Pneumonia 2012 x 2; 2015   Stroke ([facility]    a. 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   Transient ischemic attack (TIA) 01/2013    Past Surgical History:  Procedure Laterality Date   CARDIOVERSION N/A 02/18/2013   Procedure: CARDIOVERSION;  Surgeon: MSanda Klein MD;  [facility];  Service: Cardiovascular;  Laterality: N/A;   CARDIOVERSION N/A 02/23/2013   Procedure: CARDIOVERSION;  Surgeon: DLeonie Man MD;  [place] [facility]  Service: Cardiovascular;   Laterality: N/A;  [facility]   CARDIOVERSION N/A 03/12/2016   Procedure: CARDIOVERSION;  Surgeon: DLarey Dresser MD;  [place] [facility]  Service: Cardiovascular;  Laterality: N/A;   RIGHT/LEFT HEART CATH AND CORONARY ANGIOGRAPHY N/A 04/02/2021   Procedure: RIGHT/LEFT HEART CATH AND CORONARY ANGIOGRAPHY;  Surgeon: SBelva Crome MD;  [facility];  Service: Cardiovascular;  Laterality: N/A;   TEE WITHOUT CARDIOVERSION N/A 02/18/2013   Procedure: TRANSESOPHAGEAL ECHOCARDIOGRAM (TEE);  Surgeon: MSanda Klein MD;  [facility];  Service: Cardiovascular;  Laterality: N/A;   TUBAL LIGATION  1992    Family History  Problem Relation Age of Onset   Cancer Mother    Atrial fibrillation Mother    Breast cancer Mother    Hypertension Father    Atrial fibrillation Father    Peripheral vascular disease Father    Hypertension Other    Diabetes Other    Heart attack Neg Hx    Stroke Neg Hx    Rectal cancer Neg Hx    Colon cancer Neg Hx    Esophageal cancer Neg Hx    Stomach cancer Neg Hx     Social History   Socioeconomic History   Marital status: Single    Spouse name: Not on file   Number of children: Not on file   Years of education: Not on file   Highest education level: Not on file  Occupational History   Occupation: Airline pilot: [employer]   Occupation: in home health aide  Tobacco Use   Smoking status: Former    Years: 3.00    Types: Cigarettes    Quit date: 03/15/2013    Years since quitting: 9.4   Smokeless tobacco: Never  Vaping Use   Vaping Use: Never used  Substance and Sexual Activity   Alcohol use: Yes    Comment: drinks on the weekends,   2-3 beers   Drug use: No   Sexual activity: Not Currently    Birth control/protection: None  Other Topics Concern   Not on file  Social History Narrative   Not on file   Social Determinants of Health   Financial Resource Strain: Not on file  Food Insecurity: Not on file   Transportation Needs: Not on file  Physical Activity: Not on file  Stress: Not on file  Social Connections: Not on file    Allergies  Allergen Reactions   Penicillins Hives and Other (See Comments)    Unknown  Has patient had a PCN reaction causing immediate rash, facial/tongue/throat swelling, SOB or lightheadedness with hypotension: No Has patient had a PCN reaction causing severe rash involving mucus membranes or skin necrosis: No Has patient had a PCN reaction that required hospitalization No Has patient had a PCN reaction occurring within the last 10 years: No If all of the above answers are "NO", then may proceed with Cephalosporin use.    Outpatient Medications Prior to Visit  Medication Sig Dispense Refill   Accu-Chek FastClix Lancets MISC USE AS INSTRUCTED TO CHECK BLOOD SUGAR ONCE DAILY. 102 each 0   acetaminophen (TYLENOL) 500 MG tablet Take 1,000 mg by mouth every 6 (six) hours as needed for headache.      atorvastatin (LIPITOR) 80 MG tablet Take 1 tablet (80 mg total) by mouth daily at 6 PM. 30 tablet 2   Blood Glucose Monitoring Suppl (ACCU-CHEK GUIDE ME) w/Device KIT 1 kit by Does not apply route daily. 1 kit 0   dapagliflozin propanediol (FARXIGA) 10 MG TABS tablet Take 1 tablet by mouth daily. Need office visit for refills. 90 tablet 3   digoxin (LANOXIN) 0.25 MG tablet Take 1 tablet (0.25 mg total) by mouth daily. 90 tablet 2   doxycycline (VIBRA-TABS) 100 MG tablet Take 1 tablet (100 mg total) by mouth 2 (two) times daily. 14 tablet 0   furosemide (LASIX) 20 MG tablet Take 2 tablets (40 mg total) by mouth daily. 60 tablet 0   gabapentin (NEURONTIN) 300 MG capsule TAKE 1 CAPSULE (300 MG TOTAL) BY MOUTH 2 (TWO) TIMES DAILY. 90 capsule 0   glucose blood (ACCU-CHEK GUIDE) test strip Use as instructed 100 strip 0   losartan (COZAAR) 25 MG tablet Take 1/2 tablet (12.5 mg total) by mouth daily. 45 tablet 3   metoprolol succinate (TOPROL-XL) 50 MG 24 hr tablet Take 3  tablets (150 mg total) by mouth daily. 90 tablet 1   Misc. Devices MISC Blood pressure monitor.  Diagnosis Hypertension 1 each 0   mupirocin ointment (BACTROBAN) 2 % Apply 1 Application topically 2 (two) times daily. 22 g 0   rivaroxaban (XARELTO) 20 MG TABS tablet TAKE 1 TABLET (20 MG TOTAL) BY MOUTH DAILY WITH SUPPER. 90 tablet 1   spironolactone (ALDACTONE) 25 MG tablet Take 0.5 tablets (12.5 mg total) by mouth daily. 45 tablet 3   No facility-administered medications prior to visit.  ROS Review of Systems  Constitutional:  Negative for activity change and appetite change.  HENT:  Negative for sinus pressure and sore throat.   Respiratory:  Negative for chest tightness, shortness of breath and wheezing.   Cardiovascular:  Negative for chest pain and palpitations.  Gastrointestinal:  Negative for abdominal distention, abdominal pain and constipation.  Genitourinary: Negative.   Musculoskeletal: Negative.   Psychiatric/Behavioral:  Negative for behavioral problems and dysphoric mood.     Objective:  BP (!) 145/80   Pulse 92   Ht _0  (1.626 m)   Wt 200 lb (90.7 kg)   SpO2 99%   BMI 34.33 kg/m      08/18/2022    4:08 PM 08/18/2022    3:38 PM 08/14/2022    3:17 PM  BP/Weight  Systolic BP 916 384 665  Diastolic BP 80 88 993  Wt. (Lbs)  200   BMI  34.33 kg/m2       Physical Exam Constitutional:      Appearance: She is well-developed.  Cardiovascular:     Rate and Rhythm: Normal rate.     Heart sounds: Normal heart sounds. No murmur heard. Pulmonary:     Effort: Pulmonary effort is normal.     Breath sounds: Normal breath sounds. No wheezing or rales.  Chest:     Chest wall: No tenderness.  Abdominal:     General: Bowel sounds are normal. There is no distension.     Palpations: Abdomen is soft. There is no mass.     Tenderness: There is no abdominal tenderness. There is no right CVA tenderness or left CVA tenderness.  Musculoskeletal:     Right lower leg: No  edema.     Left lower leg: No edema.     Comments: Reproducible tenderness on palpation of posterior rib cage and subcostal region. Bilateral hands reveal slight prominence of right radial stylus process and left MCP joint with no tenderness  Neurological:     Mental Status: She is alert and oriented to person, place, and time.  Psychiatric:        Mood and Affect: Mood normal.    Diabetic Foot Exam - Simple   Simple Foot Form Diabetic Foot exam was performed with the following findings: Yes 08/18/2022  4:09 PM  Visual Inspection No deformities, no ulcerations, no other skin breakdown bilaterally: Yes Sensation Testing Intact to touch and monofilament testing bilaterally: Yes Pulse Check Posterior Tibialis and Dorsalis pulse intact bilaterally: Yes Comments         Latest Ref Rng & Units 05/08/2022    2:29 PM 04/23/2022   11:16 AM 11/21/2021    4:39 PM  CMP  Glucose 70 - 99 mg/dL 96  110  127   BUN 6 - 24 mg/dL _1 Creatinine 0.57 - 1.00 mg/dL 0.95  1.17  0.70   Sodium 134 - 144 mmol/L 145  142  143   Potassium 3.5 - 5.2 mmol/L 4.5  4.3  3.4   Chloride 96 - 106 mmol/L 106  103  106   CO2 20 - 29 mmol/L 21  22    Calcium 8.7 - 10.2 mg/dL 9.7  9.6    Total Protein 6.0 - 8.5 g/dL  8.0    Total Bilirubin 0.0 - 1.2 mg/dL  0.8    Alkaline Phos 44 - 121 IU/L  111    AST 0 - 40 IU/L  19    ALT 0 -  32 IU/L  14      Lipid Panel     Component Value Date/Time   CHOL 115 04/23/2022 1116   TRIG 68 04/23/2022 1116   HDL 40 04/23/2022 1116   CHOLHDL 6.2 03/29/2021 0036   VLDL 14 03/29/2021 0036   LDLCALC 61 04/23/2022 1116    CBC    Component Value Date/Time   WBC 9.6 11/21/2021 1652   RBC 4.79 11/21/2021 1652   HGB 12.7 11/21/2021 1652   HCT 41.7 11/21/2021 1652   PLT 207 11/21/2021 1652   MCV 87.1 11/21/2021 1652   MCH 26.5 11/21/2021 1652   MCHC 30.5 11/21/2021 1652   RDW 14.3 11/21/2021 1652   LYMPHSABS 1.8 03/28/2021 0529   MONOABS 0.6 03/28/2021 0529    EOSABS 0.1 03/28/2021 0529   BASOSABS 0.1 03/28/2021 0529    Lab Results  Component Value Date   HGBA1C 6.0 (A) 04/21/2022    Assessment & Plan:  1. Costochondritis Negative for CVA tenderness hence low suspicion for renal etiology Discussed pathophysiology of costochondritis She will use topical Voltaren gel as needed pain  2. 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 Osteoarthritis of hand, unspecified laterality, unspecified osteoarthritis type Reassured Use topical NSAID  4. Hypertension associated with diabetes ([place]) Slightly above goal Increase spironolactone dose from 12.5 to 25 mg Will check potassium in 2 weeks due to the fact that she is also on an ARB as well as MRA - Potassium; Future - spironolactone (ALDACTONE) 25 MG tablet; Take 1 tablet (25 mg total) by mouth daily.  Dispense: 90 tablet; Refill: 1    Meds ordered this encounter  Medications   spironolactone (ALDACTONE) 25 MG tablet    Sig: Take 1 tablet (25 mg total) by mouth daily.    Dispense:  90 tablet    Refill:  1    Dose increase    Follow-up: Return in about 3 months (around 11/17/2022) for Medical conditions with PCP.       [name], MD, FAAFP. [facility] [place], [place]   08/18/2022, 4:12 PM

## 2022-08-18 NOTE — Progress Notes (Signed)
Knot on back of head and hand.

## 2022-08-18 NOTE — Telephone Encounter (Signed)
Pt called and VM was left informing patient that orders are still active.

## 2022-08-19 ENCOUNTER — Ambulatory Visit
Admission: RE | Admit: 2022-08-19 | Discharge: 2022-08-19 | Disposition: A | Discharge status: Home / Self Care | Payer: Medicaid Other | Source: Ambulatory Visit | Attending: Physician Assistant | Admitting: Physician Assistant

## 2022-08-19 ENCOUNTER — Other Ambulatory Visit: Payer: Self-pay

## 2022-08-19 DIAGNOSIS — R220 Localized swelling, mass and lump, head: Secondary | ICD-10-CM

## 2022-08-19 DIAGNOSIS — M79645 Pain in left finger(s): Secondary | ICD-10-CM | POA: Diagnosis not present

## 2022-08-19 DIAGNOSIS — M79644 Pain in right finger(s): Secondary | ICD-10-CM

## 2022-08-19 DIAGNOSIS — M7989 Other specified soft tissue disorders: Secondary | ICD-10-CM | POA: Diagnosis not present

## 2022-08-21 LAB — URINE CULTURE

## 2022-09-10 ENCOUNTER — Other Ambulatory Visit: Payer: Self-pay | Admitting: Family Medicine

## 2022-09-10 DIAGNOSIS — Z1231 Encounter for screening mammogram for malignant neoplasm of breast: Secondary | ICD-10-CM

## 2022-09-18 ENCOUNTER — Other Ambulatory Visit: Payer: Self-pay

## 2022-09-18 DIAGNOSIS — M25511 Pain in right shoulder: Secondary | ICD-10-CM | POA: Diagnosis not present

## 2022-09-18 DIAGNOSIS — H9201 Otalgia, right ear: Secondary | ICD-10-CM | POA: Diagnosis not present

## 2022-09-18 DIAGNOSIS — R519 Headache, unspecified: Secondary | ICD-10-CM | POA: Diagnosis not present

## 2022-09-18 MED ORDER — TOPIRAMATE 25 MG PO TABS
25.0000 mg | ORAL_TABLET | Freq: Every day | ORAL | 2 refills | Status: DC
  Filled 2022-09-18: qty 30, 30d supply, fill #0

## 2022-09-24 ENCOUNTER — Other Ambulatory Visit: Payer: Self-pay

## 2022-10-11 ENCOUNTER — Emergency Department (HOSPITAL_COMMUNITY): Payer: Medicaid Other

## 2022-10-11 ENCOUNTER — Emergency Department (HOSPITAL_COMMUNITY)
Admission: EM | Admit: 2022-10-11 | Discharge: 2022-10-11 | Disposition: A | Discharge status: Home / Self Care | Payer: Medicaid Other | Attending: Emergency Medicine | Admitting: Emergency Medicine

## 2022-10-11 ENCOUNTER — Encounter (HOSPITAL_COMMUNITY): Payer: Self-pay | Admitting: Emergency Medicine

## 2022-10-11 DIAGNOSIS — I5041 Acute combined systolic (congestive) and diastolic (congestive) heart failure: Secondary | ICD-10-CM | POA: Diagnosis not present

## 2022-10-11 DIAGNOSIS — R29818 Other symptoms and signs involving the nervous system: Secondary | ICD-10-CM | POA: Diagnosis not present

## 2022-10-11 DIAGNOSIS — Z79899 Other long term (current) drug therapy: Secondary | ICD-10-CM | POA: Diagnosis not present

## 2022-10-11 DIAGNOSIS — Z7901 Long term (current) use of anticoagulants: Secondary | ICD-10-CM | POA: Diagnosis not present

## 2022-10-11 DIAGNOSIS — I2510 Atherosclerotic heart disease of native coronary artery without angina pectoris: Secondary | ICD-10-CM | POA: Insufficient documentation

## 2022-10-11 DIAGNOSIS — I110 Hypertensive heart disease with heart failure: Secondary | ICD-10-CM | POA: Insufficient documentation

## 2022-10-11 DIAGNOSIS — E119 Type 2 diabetes mellitus without complications: Secondary | ICD-10-CM | POA: Diagnosis not present

## 2022-10-11 DIAGNOSIS — R519 Headache, unspecified: Secondary | ICD-10-CM | POA: Diagnosis not present

## 2022-10-11 DIAGNOSIS — Z7984 Long term (current) use of oral hypoglycemic drugs: Secondary | ICD-10-CM | POA: Diagnosis not present

## 2022-10-11 DIAGNOSIS — I6381 Other cerebral infarction due to occlusion or stenosis of small artery: Secondary | ICD-10-CM | POA: Diagnosis not present

## 2022-10-11 DIAGNOSIS — Z87891 Personal history of nicotine dependence: Secondary | ICD-10-CM | POA: Insufficient documentation

## 2022-10-11 DIAGNOSIS — Z955 Presence of coronary angioplasty implant and graft: Secondary | ICD-10-CM | POA: Insufficient documentation

## 2022-10-11 LAB — CBC WITH DIFFERENTIAL/PLATELET
Abs Immature Granulocytes: 0.02 10*3/uL (ref 0.00–0.07)
Basophils Absolute: 0.1 10*3/uL (ref 0.0–0.1)
Basophils Relative: 1 %
Eosinophils Absolute: 0.1 10*3/uL (ref 0.0–0.5)
Eosinophils Relative: 2 %
HCT: 42.8 % (ref 36.0–46.0)
Hemoglobin: 13.2 g/dL (ref 12.0–15.0)
Immature Granulocytes: 0 %
Lymphocytes Relative: 25 %
Lymphs Abs: 2.1 10*3/uL (ref 0.7–4.0)
MCH: 26.8 pg (ref 26.0–34.0)
MCHC: 30.8 g/dL (ref 30.0–36.0)
MCV: 87 fL (ref 80.0–100.0)
Monocytes Absolute: 0.6 10*3/uL (ref 0.1–1.0)
Monocytes Relative: 7 %
Neutro Abs: 5.4 10*3/uL (ref 1.7–7.7)
Neutrophils Relative %: 65 %
Platelets: 185 10*3/uL (ref 150–400)
RBC: 4.92 MIL/uL (ref 3.87–5.11)
RDW: 14.6 % (ref 11.5–15.5)
WBC: 8.2 10*3/uL (ref 4.0–10.5)
nRBC: 0 % (ref 0.0–0.2)

## 2022-10-11 LAB — COMPREHENSIVE METABOLIC PANEL
ALT: 23 U/L (ref 0–44)
AST: 27 U/L (ref 15–41)
Albumin: 3.6 g/dL (ref 3.5–5.0)
Alkaline Phosphatase: 89 U/L (ref 38–126)
Anion gap: 10 (ref 5–15)
BUN: 8 mg/dL (ref 6–20)
CO2: 24 mmol/L (ref 22–32)
Calcium: 8.8 mg/dL — ABNORMAL LOW (ref 8.9–10.3)
Chloride: 106 mmol/L (ref 98–111)
Creatinine, Ser: 0.89 mg/dL (ref 0.44–1.00)
GFR, Estimated: >60 mL/min (ref >60)
Glucose, Bld: 115 mg/dL — ABNORMAL HIGH (ref 70–99)
Potassium: 3.5 mmol/L (ref 3.5–5.1)
Sodium: 140 mmol/L (ref 135–145)
Total Bilirubin: 0.5 mg/dL (ref 0.3–1.2)
Total Protein: 7.4 g/dL (ref 6.5–8.1)

## 2022-10-11 MED ORDER — PROMETHAZINE HCL 12.5 MG PO TABS
12.5000 mg | ORAL_TABLET | Freq: Four times a day (QID) | ORAL | 0 refills | Status: DC | PRN
  Filled 2022-10-11: qty 15, 4d supply, fill #0

## 2022-10-11 MED ORDER — ACETAMINOPHEN 325 MG PO TABS
650.0000 mg | ORAL_TABLET | Freq: Once | ORAL | Status: AC
  Administered 2022-10-11: 650 mg via ORAL
  Filled 2022-10-11: qty 2

## 2022-10-11 MED ORDER — DIPHENHYDRAMINE HCL 50 MG/ML IJ SOLN
12.5000 mg | Freq: Once | INTRAMUSCULAR | Status: AC
  Administered 2022-10-11: 12.5 mg via INTRAVENOUS
  Filled 2022-10-11: qty 1

## 2022-10-11 MED ORDER — LIDOCAINE 5 % EX PTCH
1.0000 | MEDICATED_PATCH | Freq: Once | CUTANEOUS | Status: DC
  Administered 2022-10-11: 1 via TRANSDERMAL
  Filled 2022-10-11: qty 1

## 2022-10-11 MED ORDER — SODIUM CHLORIDE 0.9 % IV BOLUS
1000.0000 mL | Freq: Once | INTRAVENOUS | Status: AC
  Administered 2022-10-11: 1000 mL via INTRAVENOUS

## 2022-10-11 MED ORDER — SODIUM CHLORIDE 0.9 % IV SOLN
25.0000 mg | Freq: Once | INTRAVENOUS | Status: AC
  Administered 2022-10-11: 25 mg via INTRAVENOUS
  Filled 2022-10-11: qty 1

## 2022-10-11 NOTE — ED Provider Triage Note (Signed)
Emergency Medicine Provider Triage Evaluation Note  [name] , a 57 y.o. female  was evaluated in triage.  Pt complains of a headache.  Pt has high blood pressure   Review of Systems  Positive: Right sided headache Negative: Fever   Physical Exam  BP (!) 187/115 (BP Location: Right Arm)   Pulse (!) 108   Temp 98.3 F (36.8 C)   Resp 15   SpO2 94%  Gen:   Awake, no distress   Resp:  Normal effort  MSK:   Moves extremities without difficulty  Other:    Medical Decision Making  Medically screening exam initiated at 1:37 PM.  Appropriate orders placed.  [name] was informed that the remainder of the evaluation will be completed by another provider, this initial triage assessment does not replace that evaluation, and the importance of remaining in the ED until their evaluation is complete.     Fransico Meadow, [place] 10/11/22 1340

## 2022-10-11 NOTE — ED Triage Notes (Signed)
Patient is here for evaluation of pain on posterior right side of head, near a mass she had evaluated by PCP approximately two months ago. Patient states this pain started at the same time the mass appeared but is normally intermittent, patient here today because pain has become more constant.

## 2022-10-11 NOTE — ED Provider Notes (Signed)
[name] Provider Note  CSN: [id number] Arrival date & time: 10/11/22 1313  Chief Complaint(s) Headache  HPI [name] is a 57 y.o. 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  Patient was seen at primary care due to daily headaches, she was started on Topamax 25 mg nightly, [facility] primary care NP [place]  She reports the ED secondary to headache.  She feels her headache is not significantly worsened over the past few days but is still persistent.  Headache has been essentially constant over the past week.  She did not pick up her Topamax prescription.  She has been taking Motrin as needed for headache which has improved her symptoms.  No nausea or vomiting.  No photophobia or phonophobia.  No numbness or tingling.  No vision changes.  Headache describes a throbbing, pulsatile sensation normally to the right side of her head.  Sometimes in the front.  No head trauma.  Past Medical History Past Medical History:  Diagnosis Date   Clotting disorder ([place])    on xarelto   Essential hypertension    Heart murmur    a. 10/2015 Echo: EF 60-65%, no rwma, mild AI/MR, sev dil LA/RA, PASP 13mHg.   Noncompliance    NSVT (nonsustained ventricular tachycardia) ([facility]    a. 10/2015 during admission for CVA/AF.   Persistent atrial fibrillation ([facility]    a. CHA2DS2VASC = 4-->Xarelto;  b. 02/2016 Successful DCCV.  c. Recurrent fib   Pneumonia 2012 x 2; 2015   Stroke ([facility]    a. 23/3354Embolic CVA of mid right middle cerebral atery - recieved TPA-->small amt of asymptomatic hemorrhagic transformation.   Transient ischemic attack (TIA) 01/2013   Patient Active Problem List   Diagnosis Date Noted   Type 2 diabetes mellitus without complication, without long-term current use of insulin  ([place] 06/17/2021   Coronary artery disease involving native coronary artery of native heart without angina pectoris 04/10/2021   Acute combined systolic and diastolic HF (heart failure) ([facility] 04/01/2021   Atrial fibrillation with RVR ([facility] 03/28/2021   Elevated troponin 03/28/2021   Longstanding persistent atrial fibrillation ([place] 04/16/2020   Secondary hypercoagulable state ([facility] 04/16/2020   Edema leg 05/17/2019   Severe obesity (BMI 35.0-39.9) with comorbidity ([place] 04/04/2019   Prediabetes 04/04/2019   Seasonal allergic rhinitis due to pollen 12/14/2018   Cough 12/14/2018   Chronic back pain 08/23/2018   OSA on CPAP 06/23/2018   Acute ischemic right MCA stroke ([place] 02/08/2018   Headache 01/29/2017   Refractive errors 04/15/2016   Medical non-compliance 11/06/2015   Hyperlipidemia LDL goal <70 11/06/2015   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 Authorizing Provider  promethazine (PHENERGAN) 12.5 MG tablet Take 1 tablet (12.5 mg total) by mouth every 6 (six) hours as needed for up to 15 doses for nausea or vomiting. 10/11/22  Yes Wynona Dove A, DO  Accu-Chek FastClix Lancets MISC USE AS INSTRUCTED TO CHECK BLOOD SUGAR ONCE DAILY. 08/14/22   Argentina Donovan, PA-C  acetaminophen (TYLENOL) 500 MG tablet Take 1,000 mg by mouth every 6 (six) hours as needed for headache.     [provider]  atorvastatin (LIPITOR) 80 MG tablet Take  1 tablet (80 mg total) by mouth daily at 6 PM. 07/25/22   Croitoru, Mihai, MD  Blood Glucose Monitoring Suppl (ACCU-CHEK GUIDE ME) w/Device KIT 1 kit by Does not apply route daily. 05/02/19   [name], MD  dapagliflozin propanediol (FARXIGA) 10 MG TABS tablet Take 1 tablet by mouth daily. Need office visit for refills. 07/10/21   Croitoru, Mihai, MD  digoxin (LANOXIN) 0.25 MG tablet Take 1 tablet (0.25 mg total) by mouth daily. 07/11/22   Croitoru, Mihai, MD  doxycycline (VIBRA-TABS) 100 MG tablet Take 1 tablet (100 mg total) by mouth 2 (two) times daily. 08/14/22   Argentina Donovan, PA-C  furosemide (LASIX) 20 MG tablet Take 2 tablets (40 mg total) by mouth daily. 07/24/22   [name], MD  gabapentin (NEURONTIN) 300 MG capsule TAKE 1 CAPSULE (300 MG TOTAL) BY MOUTH 2 (TWO) TIMES DAILY. 10/07/21 10/07/22  [name], MD  glucose blood (ACCU-CHEK GUIDE) test strip Use as instructed 08/14/22   Argentina Donovan, PA-C  losartan (COZAAR) 25 MG tablet Take 1/2 tablet (12.5 mg total) by mouth daily. 07/25/22 10/23/22  Croitoru, Mihai, MD  metoprolol succinate (TOPROL-XL) 50 MG 24 hr tablet Take 3 tablets (150 mg total) by mouth daily. 06/26/22   Croitoru, Dani Gobble, MD  Misc. Devices MISC Blood pressure monitor.  Diagnosis Hypertension 02/11/19   [name], MD  mupirocin ointment (BACTROBAN) 2 % Apply 1 Application topically 2 (two) times daily. 08/14/22   Argentina Donovan, PA-C  rivaroxaban (XARELTO) 20 MG TABS tablet TAKE 1 TABLET (20 MG TOTAL) BY MOUTH DAILY WITH SUPPER. 03/25/22 03/25/23  Croitoru, Mihai, MD  spironolactone (ALDACTONE) 25 MG tablet Take 1 tablet (25 mg total) by mouth daily. 08/18/22   [name], MD  topiramate (TOPAMAX) 25 MG tablet Take 1 tablet (25 mg total) by mouth at bedtime. 09/18/22     albuterol (PROVENTIL HFA;VENTOLIN HFA) 108 (90 Base) MCG/ACT inhaler Inhale 2 puffs into the lungs every 6 (six) hours as needed for wheezing or shortness of breath. Patient not  taking: Reported on 03/28/2021 11/17/18 03/28/21  [name], MD  fluticasone (FLONASE) 50 MCG/ACT nasal spray Place 2 sprays into both nostrils daily. Patient not taking: Reported on 03/28/2021 12/14/18 03/28/21  [name] Stain, MD                                                                                                                                    Past Surgical History Past Surgical History:  Procedure Laterality Date   CARDIOVERSION N/A 02/18/2013   Procedure: CARDIOVERSION;  Surgeon: Sanda Klein, MD;  [facility] END[facility];  Service: Cardiovascular;  Laterality: N/A;   CARDIOVERSION N/A 02/23/2013   Procedure: CARDIOVERSION;  Surgeon: Leonie Man, MD;  Location: [place];  Service: Cardiovascular;  Laterality: N/A;  [place] CV   CARDIOVERSION N/A 03/12/2016   Procedure: CARDIOVERSION;  Surgeon: Larey Dresser, MD;  Location: [place];  Service: Cardiovascular;  Laterality: N/A;   RIGHT/LEFT HEART CATH AND CORONARY ANGIOGRAPHY N/A 04/02/2021   Procedure: RIGHT/LEFT HEART CATH AND CORONARY ANGIOGRAPHY;  Surgeon: Belva Crome, MD;  Location: [place] CV LAB;  Service: Cardiovascular;  Laterality: N/A;   TEE WITHOUT CARDIOVERSION N/A 02/18/2013   Procedure: TRANSESOPHAGEAL ECHOCARDIOGRAM (TEE);  Surgeon: Sanda Klein, MD;  Location: [facility] ENDOSCOPY;  Service: Cardiovascular;  Laterality: N/A;   TUBAL LIGATION  1992   Family History Family History  Problem Relation Age of Onset   Cancer Mother    Atrial fibrillation Mother    Breast cancer Mother    Hypertension Father    Atrial fibrillation Father    Peripheral vascular disease Father    Hypertension Other    Diabetes Other    Heart attack Neg Hx    Stroke Neg Hx    Rectal cancer Neg Hx    Colon cancer Neg Hx    Esophageal cancer Neg Hx    Stomach cancer Neg Hx     Social History Social History   Tobacco Use   Smoking status: Former    Years: 3.00    Types: Cigarettes    Quit date: 03/15/2013    Years  since quitting: 9.5   Smokeless tobacco: Never  Vaping Use   Vaping Use: Never used  Substance Use Topics   Alcohol use: Yes    Comment: drinks on the weekends,   2-3 beers   Drug use: No   Allergies Penicillins  Review of Systems Review of Systems  Constitutional:  Negative for chills and fever.  HENT:  Negative for facial swelling and trouble swallowing.   Eyes:  Negative for photophobia and visual disturbance.  Respiratory:  Negative for cough and shortness of breath.   Cardiovascular:  Negative for chest pain and palpitations.  Gastrointestinal:  Negative for abdominal pain, nausea and vomiting.  Endocrine: Negative for polydipsia and polyuria.  Genitourinary:  Negative for difficulty urinating and hematuria.  Musculoskeletal:  Negative for gait problem and joint swelling.  Skin:  Negative for pallor and rash.  Neurological:  Positive for headaches. Negative for syncope.  Psychiatric/Behavioral:  Negative for agitation and confusion.     Physical Exam Vital Signs  I have reviewed the triage vital signs BP (!) 154/97   Pulse 89   Temp 98.3 F (36.8 C) (Oral)   Resp (!) 23   SpO2 100%  Physical Exam Vitals and nursing note reviewed.  Constitutional:      General: She is not in acute distress.    Appearance: Normal appearance. She is not ill-appearing or diaphoretic.  HENT:     Head: Normocephalic and atraumatic.     Right Ear: External ear normal.     Left Ear: External ear normal.     Nose: Nose normal.     Mouth/Throat:     Mouth: Mucous membranes are moist.  Eyes:     General: No scleral icterus.       Right eye: No discharge.        Left eye: No discharge.     Extraocular Movements: Extraocular movements intact.  Pupils: Pupils are equal, round, and reactive to light.  Cardiovascular:     Rate and Rhythm: Normal rate and regular rhythm.     Pulses: Normal pulses.     Heart sounds: Normal heart sounds.  Pulmonary:     Effort: Pulmonary effort is  normal. No respiratory distress.     Breath sounds: Normal breath sounds.  Abdominal:     General: Abdomen is flat.     Tenderness: There is no abdominal tenderness.  Musculoskeletal:     Cervical back: No rigidity.     Right lower leg: No edema.     Left lower leg: No edema.  Skin:    General: Skin is warm and dry.     Capillary Refill: Capillary refill takes less than 2 seconds.  Neurological:     Mental Status: She is alert and oriented to person, place, and time.     GCS: GCS eye subscore is 4. GCS verbal subscore is 5. GCS motor subscore is 6.     Cranial Nerves: Cranial nerves 2-12 are intact.     Sensory: Sensation is intact.     Motor: Motor function is intact.     Coordination: Coordination is intact.     Gait: Gait is intact.     Comments: Strength 5/5 lower upper and lower extremities Gait steady Speaking clearly and in full sentences  Psychiatric:        Mood and Affect: Mood normal.        Behavior: Behavior normal.     ED Results and Treatments Labs (all labs ordered are listed, but only abnormal results are displayed) Labs Reviewed  COMPREHENSIVE METABOLIC PANEL - Abnormal; Notable for the following components:      Result Value   Glucose, Bld 115 (*)    Calcium 8.8 (*)    All other components within normal limits  CBC WITH DIFFERENTIAL/PLATELET                                                                                                                          Radiology CT Head Wo Contrast  Result Date: 10/11/2022 CLINICAL DATA:  Headache, neuro deficit. Patient reports posterior right-sided head pain near a mass reported or primary care physician approximally 2 months ago. Pain has become more consistent. EXAM: CT HEAD WITHOUT CONTRAST TECHNIQUE: Contiguous axial images were obtained from the base of the skull through the vertex without intravenous contrast. RADIATION DOSE REDUCTION: This exam was performed according to the departmental  dose-optimization program which includes automated exposure control, adjustment of the mA and/or kV according to patient size and/or use of iterative reconstruction technique. COMPARISON:  CT head without contrast 11/21/2021. MR head 02/08/2018 FINDINGS: Brain: Remote infarcts of the right frontal lobe, right temporal and occipital lobe again noted. The cortex is somewhat indistinct in the anterior right frontal lobe. Finding is less clear on the reformatted images and may represent streak artifact. No other acute cortical abnormality is present.  No acute hemorrhage or mass lesion is present. Deep brain nuclei are within normal limits. Remote lacunar infarcts are present in the inferior left cerebellum. The brainstem and cerebellum are otherwise within normal limits. The ventricles are of normal size. No significant extraaxial fluid collection is present. Vascular: Atherosclerotic calcifications are present within the cavernous internal carotid arteries bilaterally. No asymmetric hyperdense vessel is present. Skull: Calvarium is intact. No focal lytic or blastic lesions are present. Hyperostosis frontalis internus is noted. A small lymph node may be present along the deep cervical fascia at the right occipital scalp on image 20 of series 3 and series 4. This may correspond to the palpable lesion. No other focal soft tissue lesions are present. Sinuses/Orbits: The paranasal sinuses and mastoid air cells are clear. The globes and orbits are within normal limits. IMPRESSION: 1. Remote infarcts of the right frontal lobe, right temporal and occipital lobe again noted. 2. The cortex is somewhat indistinct in the anterior right frontal lobe. Finding is less clear on the reformatted images and most likely represents streak artifact. 3. Remote lacunar infarcts in the inferior left cerebellum. 4. No acute intracranial abnormality or significant interval change. 5. Small lymph node along the deep cervical fascia at the right  occipital scalp may correspond to the palpable lesion the patient reports. Electronically Signed   By: San Morelle M.D.   On: 10/11/2022 15:13    Pertinent labs & imaging results that were available during my care of the patient were reviewed by me and considered in my medical decision making (see MDM for details).  Medications Ordered in ED Medications  lidocaine (LIDODERM) 5 % 1 patch (1 patch Transdermal Patch Applied 10/11/22 1648)  sodium chloride 0.9 % bolus 1,000 mL (1,000 mLs Intravenous New Bag/Given 10/11/22 1648)  promethazine (PHENERGAN) 25 mg in sodium chloride 0.9 % 50 mL IVPB (0 mg Intravenous Stopped 10/11/22 1835)  diphenhydrAMINE (BENADRYL) injection 12.5 mg (12.5 mg Intravenous Given 10/11/22 1647)  acetaminophen (TYLENOL) tablet 650 mg (650 mg Oral Given 10/11/22 1648)                                                                                                                                     Procedures Procedures  (including critical care time)  Medical Decision Making / ED Course   MDM:  LORRENE GRAEF is a 57 y.o. 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 . The complaint involves an extensive differential diagnosis and also carries with it a high risk of complications and morbidity.  Serious etiology was considered. Ddx includes but is not limited to: Differential diagnosis includes but is not exclusive to subarachnoid hemorrhage, meningitis, encephalitis, previous head trauma, cavernous venous thrombosis, muscle tension headache, glaucoma, temporal arteritis, migraine or migraine equivalent, etc.  On initial assessment the patient is: She currently has a headache, neuroexam is nonfocal.  She is HDS. Vital signs and nursing notes were reviewed    Patient well-appearing, neuro nonfocal.   She has headache.  Favor migrainous.  Screening labs obtained in triage along with CT head which was reviewed which are stable.  Will give patient IV fluids, Phenergan, Benadryl, lidocaine patch and Tylenol.  advised to refrain from NSAID use given her use of DOAC.  On recheck her symptoms have resolved entirely.  She is feeling back to normal.  She will tolerate p.o., she is able to able to the bathroom not difficulty.  Repeat neurocheck is nonfocal.  She is stable for discharge.    Patient presents with headache. Based on the patient's history and physical there is very low clinical suspicion for significant intracranial pathology. The headache was not sudden onset, not maximal at onset, there are no neurologic findings on exam, the patient does not have a fever, the patient does not have any jaw claudication, the patient does not endorse a clotting disorder, patient denies any trauma or eye pain and the headache is not associated with dizziness, weakness on one side of the body, diplopia, vertigo, slurred speech, or ataxia. Given the extremely low risk of these diagnoses further testing and evaluation for these possibilities does not appear to be indicated at this time.   The patient improved significantly and was discharged in stable condition. Detailed discussions were had with the patient regarding current findings, and need for close f/u with PCP or on call doctor. The patient has been instructed to return immediately if the symptoms worsen in any way for re-evaluation. Patient verbalized understanding and is in agreement with current care plan. All questions answered prior to discharge.  Additional history obtained: -Additional history obtained from na -External records from outside source obtained and reviewed including: Chart review including previous notes, labs, imaging, consultation notes including primary care documentation, prior labs and imaging, home medications   Lab Tests: -I  ordered, reviewed, and interpreted labs.   The pertinent results include:   Labs Reviewed  COMPREHENSIVE METABOLIC PANEL - Abnormal; Notable for the following components:      Result Value   Glucose, Bld 115 (*)    Calcium 8.8 (*)    All other components within normal limits  CBC WITH DIFFERENTIAL/PLATELET    Notable for Labs stable  EKG   EKG Interpretation  Date/Time:    Ventricular Rate:    PR Interval:    QRS Duration:   QT Interval:    QTC Calculation:   R Axis:     Text Interpretation:           Imaging Studies ordered: I ordered imaging studies including CT head I independently visualized the following imaging with scope of interpretation limited to determining acute life threatening conditions related to emergency care: CT HEAD demonstrates old strokes, no significant acute abnormalities at this time I independently visualized and interpreted imaging. I agree with the radiologist interpretation   Medicines ordered and prescription drug management: Meds ordered this encounter  Medications   sodium chloride 0.9 % bolus 1,000 mL   lidocaine (LIDODERM) 5 % 1 patch   promethazine (PHENERGAN) 25 mg in sodium chloride 0.9 % 50 mL IVPB   diphenhydrAMINE (BENADRYL) injection 12.5 mg   acetaminophen (TYLENOL) tablet 650 mg   promethazine (PHENERGAN) 12.5 MG tablet    Sig: Take 1 tablet (12.5 mg total) by mouth every 6 (six)  hours as needed for up to 15 doses for nausea or vomiting.    Dispense:  15 tablet    Refill:  0    -I have reviewed the patients home medicines and have made adjustments as needed   Consultations Obtained: na   Cardiac Monitoring: The patient was maintained on a cardiac monitor.  I personally viewed and interpreted the cardiac monitored which showed an underlying rhythm of: nsr  Social Determinants of Health:  Diagnosis or treatment significantly limited by social determinants of health: former smoker and obesity   Reevaluation: After  the interventions noted above, I reevaluated the patient and found that they have resolved  Co morbidities that complicate the patient evaluation  Past Medical History:  Diagnosis Date   Clotting disorder ([place])    on xarelto   Essential hypertension    Heart murmur    a. 10/2015 Echo: EF 60-65%, no rwma, mild AI/MR, sev dil LA/RA, PASP 41mHg.   Noncompliance    NSVT (nonsustained ventricular tachycardia) ([place]    a. 10/2015 during admission for CVA/AF.   Persistent atrial fibrillation ([place]    a. CHA2DS2VASC = 4-->Xarelto;  b. 02/2016 Successful DCCV.  c. Recurrent fib   Pneumonia 2012 x 2; 2015   Stroke ([facility]    a. 24/1423Embolic CVA of mid right middle cerebral atery - recieved TPA-->small amt of asymptomatic hemorrhagic transformation.   Transient ischemic attack (TIA) 01/2013      Dispostion: Disposition decision including need for hospitalization was considered, and patient discharged from emergency department.    Final Clinical Impression(s) / ED Diagnoses Final diagnoses:  Nonintractable headache, unspecified chronicity pattern, unspecified headache type     This chart was dictated using voice recognition software.  Despite best efforts to proofread,  errors can occur which can change the documentation meaning.    GJeanell Sparrow DO 10/11/22 1844

## 2022-10-11 NOTE — ED Notes (Signed)
Dc instructions and scripts reviewed with pt no questions or concerns at this time.  

## 2022-10-11 NOTE — Discharge Instructions (Addendum)
It was a pleasure caring for you today in the emergency department.  Please return to the emergency department for any worsening or worrisome symptoms.  Please keep daily headache diary.  Please follow-up PCP.  Please return if symptoms worsen.    Be sure to get extra rest, stay hydrated, avoid extra stress.   Please refrain from using NSAIDs as you are taking a blood thinning medication that interacts with NSAIDs such as Motrin, Aleve, ibuprofen

## 2022-10-13 ENCOUNTER — Other Ambulatory Visit: Payer: Self-pay

## 2022-10-19 NOTE — Progress Notes (Signed)
Cardiology Office Note:    Date:  10/28/2022   ID:  [name], DOB [birth year], MRN [id number]  PCP:  Hoy Register, MD  Cardiologist:  [name], MD  Electrophysiologist:  None   Referring MD: Hoy Register, MD   Chief Complaint: routine follow-up of CHF and atrial fibrillation  History of Present Illness:    [name] is a 57 y.o. female with a history of non-obstructive CAD on cardiac catheterization in 03/2021, non-ischemic cardiomyopathy/ chronic combined CHF with EF of 25-30% in 03/2021, chronic atrial fibrillation/ flutter on Xarelto, hypertension, dyslipidemia, type 2 diabetes mellitus, prior stroke, severe obstructive sleep apnea, and obesity who is followed by Dr. [name] and presents today for routine follow-up.    Patient is primary followed by Cardiology for CHF and atrial fibrillation. She has a long history of atrial fibrillation which is felt to be permanent at this point. She was admitted in 03/2021 with acute CHF after presenting with worsening shortness of breath, edema, and weight gain. Echo showed LVEF of 25-30% with global hypokinesis and indeterminate diastolic parameters, moderately enlarged RV with mildly reduced RV, mild AI, moderate MR, and severe TR. EF was down from 60-65% on prior Echo in 2020. She was diuresed with IV Lasix and then underwent R/LHC which showed 60% stenosis of mid LAD, 75% stenosis of distal LCX, and 70% stenosis of proximal 1st Diag as well as mild pulmonary hypertension. Medical therapy was recommended. She was started GDMT and transitioned to PO Lasix. Cardiomyopathy was felt to be tachymediated in nature.   Patient was last seen by me in 04/2022 at which time she was doing well from a cardiac standpoint. She was not using her CPAP machine because she did not like the way the mask fit and also had difficulty positioning the CPAP machine based on the layout of the furniture in her room. Plan was to repeat an Echo to reassess LV  function; however, it does not look like this was done.Of note, Dr. [name] previously recommended transitioning from Losartan to [facility] if EF had not improved. If EF had normalized, he recommended restarting Diltiazem and stopping Digoxin.    Patient presents today for follow-up. Overall, patient is doing well since last visit. She can feel her heart beating irregularly with her atrial fibrillation but this is not really bothersome to her. She denies any heart racing, lightheadedness, dizziness, or syncope. She denies any chest pain, shortness of breath, orthopnea, or PND. She does report some mild lower extremity edema and about a 10lb weight gain over the last 2 months. She attributes this to not watching her diet over the holidays. She also reports intermittent headaches. She is scheduled to see her PCP tomorrow and is planning on asking about a referral to Neurology.    Past Medical History:  Diagnosis Date   Clotting disorder (HCC)    on xarelto   Essential hypertension    Heart murmur    a. 10/2015 Echo: EF 60-65%, no rwma, mild AI/MR, sev dil LA/RA, PASP .   Noncompliance    NSVT (nonsustained ventricular tachycardia) (HCC)    a. 10/2015 during admission for CVA/AF.   Persistent atrial fibrillation (HCC)    a. CHA2DS2VASC = 4-->Xarelto;  b. 02/2016 Successful DCCV.  c. Recurrent fib   Pneumonia 2012 x 2; 2015   Stroke [facility])    a. 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   Transient ischemic  attack (TIA) 01/2013    Past Surgical History:  Procedure Laterality Date   CARDIOVERSION N/A 02/18/2013   Procedure: CARDIOVERSION;  Surgeon: [name], MD;  Location: [facility] ENDOSCOPY;  Service: Cardiovascular;  Laterality: N/A;   CARDIOVERSION N/A 02/23/2013   Procedure: CARDIOVERSION;  Surgeon: Marykay Lex, MD;  Location: [facility] OR;  Service: Cardiovascular;  Laterality: N/A;  BESIDE CV   CARDIOVERSION  N/A 03/12/2016   Procedure: CARDIOVERSION;  Surgeon: Laurey Morale, MD;  Location: [facility] ENDOSCOPY;  Service: Cardiovascular;  Laterality: N/A;   RIGHT/LEFT HEART CATH AND CORONARY ANGIOGRAPHY N/A 04/02/2021   Procedure: RIGHT/LEFT HEART CATH AND CORONARY ANGIOGRAPHY;  Surgeon: Lyn Records, MD;  Location: [facility] INVASIVE CV LAB;  Service: Cardiovascular;  Laterality: N/A;   TEE WITHOUT CARDIOVERSION N/A 02/18/2013   Procedure: TRANSESOPHAGEAL ECHOCARDIOGRAM (TEE);  Surgeon: [name], MD;  Location: [facility] ENDOSCOPY;  Service: Cardiovascular;  Laterality: N/A;   TUBAL LIGATION  1992    Current Medications: Current Meds  Medication Sig   Accu-Chek FastClix Lancets MISC USE AS INSTRUCTED TO CHECK BLOOD SUGAR ONCE DAILY.   acetaminophen (TYLENOL) 500 MG tablet Take 1,000 mg by mouth every 6 (six) hours as needed for headache.    atorvastatin (LIPITOR) 80 MG tablet Take 1 tablet (80 mg total) by mouth daily at 6 PM.   Blood Glucose Monitoring Suppl (ACCU-CHEK GUIDE ME) w/Device KIT 1 kit by Does not apply route daily.   dapagliflozin propanediol (FARXIGA) 10 MG TABS tablet Take 1 tablet by mouth daily. Need office visit for refills.   doxycycline (VIBRA-TABS) 100 MG tablet Take 1 tablet (100 mg total) by mouth 2 (two) times daily.   gabapentin (NEURONTIN) 300 MG capsule TAKE 1 CAPSULE (300 MG TOTAL) BY MOUTH 2 (TWO) TIMES DAILY.   glucose blood (ACCU-CHEK GUIDE) test strip Use as instructed   metoprolol (TOPROL XL) 200 MG 24 hr tablet Take 1 tablet (200 mg total) by mouth daily.   Misc. Devices MISC Blood pressure monitor.  Diagnosis Hypertension   mupirocin ointment (BACTROBAN) 2 % Apply 1 Application topically 2 (two) times daily.   promethazine (PHENERGAN) 12.5 MG tablet Take 1 tablet (12.5 mg total) by mouth every 6 (six) hours as needed for up to 15 doses for nausea or vomiting.   rivaroxaban (XARELTO) 20 MG TABS tablet TAKE 1 TABLET (20 MG TOTAL) BY MOUTH DAILY WITH SUPPER.    sacubitril-valsartan (ENTRESTO) 24-26 MG Take 1 tablet by mouth 2 (two) times daily.   spironolactone (ALDACTONE) 25 MG tablet Take 1 tablet (25 mg total) by mouth daily.   topiramate (TOPAMAX) 25 MG tablet Take 1 tablet (25 mg total) by mouth at bedtime.   [DISCONTINUED] digoxin (LANOXIN) 0.25 MG tablet Take 1 tablet (0.25 mg total) by mouth daily.   [DISCONTINUED] furosemide (LASIX) 20 MG tablet Take 2 tablets (40 mg total) by mouth daily.   [DISCONTINUED] losartan (COZAAR) 25 MG tablet Take 1/2 tablet (12.5 mg total) by mouth daily.   [DISCONTINUED] metoprolol succinate (TOPROL-XL) 50 MG 24 hr tablet Take 3 tablets (150 mg total) by mouth daily.     Allergies:   Penicillins   Social History   Socioeconomic History   Marital status: Single    Spouse name: Not on file   Number of children: Not on file   Years of education: Not on file   Highest education level: Not on file  Occupational History   Occupation: [employer]    Employer: [employer]   Occupation: in  home health aide  Tobacco Use   Smoking status: Former    Years: 3.00    Types: Cigarettes    Quit date: 03/15/2013    Years since quitting: 9.6   Smokeless tobacco: Never  Vaping Use   Vaping Use: Never used  Substance and Sexual Activity   Alcohol use: Yes    Comment: drinks on the weekends,   2-3 beers   Drug use: No   Sexual activity: Not Currently    Birth control/protection: None  Other Topics Concern   Not on file  Social History Narrative   Not on file   Social Determinants of Health   Financial Resource Strain: Not on file  Food Insecurity: Not on file  Transportation Needs: Not on file  Physical Activity: Not on file  Stress: Not on file  Social Connections: Not on file     Family History: The patient's family history includes Atrial fibrillation in her father and mother; Breast cancer in her mother; Cancer in her mother; Diabetes in an other family member; Hypertension in her father and  another family member; Peripheral vascular disease in her father. There is no history of Heart attack, Stroke, Rectal cancer, Colon cancer, Esophageal cancer, or Stomach cancer.  ROS:   Please see the history of present illness.     EKGs/Labs/Other Studies Reviewed:    The following studies were reviewed:  Echocardiogram 05/09/2019: Impressions: 1. The left ventricle has normal systolic function with an ejection  fraction of 60-65%. The cavity size was normal. There is mildly increased  left ventricular wall thickness. not assessed due to atrial flutter. No  evidence of left ventricular regional  wall motion abnormalities.   2. Left atrial size was moderately dilated.   3. The mitral valve is abnormal. Mild thickening of the mitral valve  leaflet.   4. The tricuspid valve is grossly normal.   5. The aortic valve is tricuspid. Mild sclerosis of the aortic valve.  Aortic valve regurgitation is mild by color flow Doppler. No stenosis of  the aortic valve.   6. The aorta is normal unless otherwise noted.   7. The inferior vena cava was dilated in size with >50% respiratory  variability.   8. When compared to the prior study: 02/09/2018: LVEF 60-65%, mild AI,  possible LA thrombus.   _______________  Echocardiogram 03/28/2021: Impressions:  1. Left ventricular ejection fraction, by estimation, is 25 to 30%. The  left ventricle has severely decreased function. The left ventricle  demonstrates global hypokinesis. There is mild left ventricular  hypertrophy. Left ventricular diastolic parameters   are indeterminate.   2. Right ventricular systolic function is mildly reduced. The right  ventricular size is moderately enlarged. There is normal pulmonary artery  systolic pressure. The estimated right ventricular systolic pressure is  17.4 mmHg.   3. Left atrial size was mildly dilated.   4. Right atrial size was moderately dilated.   5. The mitral valve is normal in structure. Moderate  mitral valve  regurgitation. No evidence of mitral stenosis.   6. Tricuspid valve regurgitation is severe. Suspect functional from  annular dilatation.   7. The aortic valve is tricuspid. Aortic valve regurgitation is mild. No  aortic stenosis is present.   8. The inferior vena cava is dilated in size with >50% respiratory  variability, suggesting right atrial pressure of 8 mmHg.   9. The patient was in atrial fibrillation.  10. Significant change in LV and RV function compared to prior echo.  _______________   Right/Left Cardiac Catheterization 04/02/2021: 60% mid LAD after second diagonal. 75% distal circumflex before small third obtuse marginal. Right dominant anatomy Mild pulmonary hypertension with mean pressure 31 mmHg, mean capillary wedge pressure 16 mmHg, pulmonary vascular resistance 2.56 Wood units. Pulmonary artery O2 saturation 73%, cardiac output and index are 5.87 L/min and 3.01 L/min/m respectively.   Recommendations: Guideline directed therapy for systolic dysfunction Rate control Further management per treating team.   Diagnostic Dominance: Right     EKG:  EKG not ordered today.  Recent Labs: 10/11/2022: ALT 23; BUN 8; Creatinine, Ser 0.89; Hemoglobin 13.2; Platelets 185; Potassium 3.5; Sodium 140  Recent Lipid Panel    Component Value Date/Time   CHOL 115 04/23/2022 1116   TRIG 68 04/23/2022 1116   HDL 40 04/23/2022 1116   CHOLHDL 6.2 03/29/2021 0036   VLDL 14 03/29/2021 0036   LDLCALC 61 04/23/2022 1116    Physical Exam:    Vital Signs: BP (!) 152/86   Pulse (!) 105   Ht 5\' 4"  (1.626 m)   Wt 201 lb 3.2 oz (91.3 kg)   SpO2 94%   BMI 34.54 kg/m     Wt Readings from Last 3 Encounters:  10/28/22 201 lb 3.2 oz (91.3 kg)  08/18/22 200 lb (90.7 kg)  08/14/22 196 lb 3.2 oz (89 kg)     General: 57 y.o. African-American female in no acute distress. HEENT: Normocephalic and atraumatic. Sclera clear.  Neck: Supple. No carotid bruits. No  JVD. Heart:  Irregularly irregular rhythm with normal rate. Distinct S1 and S2. No murmurs, gallops, or rubs. Radial pulses 2+ and equal bilaterally. Lungs: No increased work of breathing. Clear to ausculation bilaterally. No wheezes, rhonchi, or rales.  Abdomen: Soft, non-distended, and non-tender to palpation.  Extremities: Trace lower extremity edema bilaterally.  Skin: Warm and dry. Neuro: Alert and oriented x3. No focal deficits. Psych: Normal affect. Responds appropriately.   Assessment:    1. Chronic combined systolic and diastolic CHF (congestive heart failure) (HCC)   2. Non-ischemic cardiomyopathy (HCC)   3. Chronic atrial fibrillation (HCC)   4. Coronary artery disease involving native coronary artery of native heart without angina pectoris   5. Primary hypertension   6. Dyslipidemia   7. Type 2 diabetes mellitus with complication, without long-term current use of insulin (HCC)   8. Hypertensive heart disease with chronic systolic congestive heart failure (HCC)   9. Medication management     Plan:    Chronic Combined CHF Non-Ischemic Cardiomyopathy (Presumed Tachy-Mediated) Echo in 03/2021 showed Echo showed LVEF of 25-30% with global hypokinesis and indeterminate diastolic parameters, moderately enlarged RV with mildly reduced RV, mild AI, moderate MR, and severe TR. R/LHC at the time showed non-obstructive CAD that was out of proportion to degree of cardiomyopathy. Felt to be tachy-medicated given atrial fibrillation. - Patient reports about a 10 lbs weight gain over the last 2 months. Weight is up 6 lbs on our office scales. However, she does not look significantly volume overloaded on exam.  - Will check BNP and BMET today. - Echo was previously ordered to reassess LV function but patient never had this done. Will reorder today. - Continue Lasix 40mg  daily. If BNP is significantly elevated, will likely increase Lasix for a few days.  - Currently on Losartan 25mg  daily.  Will stop and transition to [facility] 24-26mg  twice daily.  - Currently on Toprol-XL 150mg  daily. Will increase to 200mg  for additional heart rate control.  - Continue  Spironolactone 25mg  daily. - Continue Farxiga 10mg  daily. - Discussed importance of daily weight and sodium/ fluid restrictions.  - Will recheck BMET in 1 weeks after switching to [facility].    Chronic Atrial Fibrillation Long history of atrial fibrillation that is felt to likely be permanent at this time.  - Rates slightly elevated and ranging between 99 and 110 bpm during office visit  - Currently on Toprol-XL 150mg  daily. Will increase to 200mg  daily - Continue Digoxin 0.25mg  daily. Digoxin level was normal at last office visit in 04/2022. - Continue chronic anticoagulation with Xarelto 20mg  daily. - If EF has normalized on repeat Echo, may restart Diltiazem and stop Digoxin per Dr. Erin Hearing recommendation at last visit.    CAD LHC in 03/2021 showed 60% stenosis of mid LAD, 75% stenosis of distal LCX, and 70% stenosis of proximal 1st Diag. Medical therapy was recommended at that time. - No chest pain.  - No aspirin due to need for DOAC. - Continue beta-blocker and statin.    Hypertension BP elevated. Initially 162/104 and then 152/86 on my personal recheck at the end of visit. - Continue medications for CHF as above. Will stop Losartan and switch to Entresto and will increase Toprol-XL as above. - Advised patient to keep a log of her BP and heart rate for 1-2 weeks and then send this to [place].    Dyslipidemia Most recent lipid panel on 04/23/2022: Total Cholesterol 115, Triglycerides 68, HDL 40, LDL 61.  - Continue Lipitor 80mg  daily.    Type 2 Diabetes Mellitus Hemoglobin A1 6.0 on 04/21/2022. - On Farxiga as above. - Management per PCP.  Disposition: Follow up in 3 months.   Medication Adjustments/Labs and Tests Ordered: Current medicines are reviewed at length with the patient today.  Concerns regarding medicines  are outlined above.  Orders Placed This Encounter  Procedures   Pro b natriuretic peptide (BNP)   Basic Metabolic Panel (BMET)   ECHOCARDIOGRAM COMPLETE   Meds ordered this encounter  Medications   sacubitril-valsartan (ENTRESTO) 24-26 MG    Sig: Take 1 tablet by mouth 2 (two) times daily.    Dispense:  180 tablet    Refill:  3   metoprolol (TOPROL XL) 200 MG 24 hr tablet    Sig: Take 1 tablet (200 mg total) by mouth daily.    Dispense:  90 tablet    Refill:  3   digoxin (LANOXIN) 0.25 MG tablet    Sig: Take 1 tablet (0.25 mg total) by mouth daily.    Dispense:  90 tablet    Refill:  3   furosemide (LASIX) 20 MG tablet    Sig: Take 2 tablets (40 mg total) by mouth daily.    Dispense:  180 tablet    Refill:  3    Patient Instructions  Medication Instructions:  Your physician has recommended you make the following change in your medication:  STOP: Losartan START: Entresto 24-26 mg twice daily INCREASE: Metoprolol Succinate (Toprol-XL) 200 mg once daily *If you need a refill on your cardiac medications before your next appointment, please call your pharmacy*   Lab Work: Your physician recommends that you have labs drawn today: BNP, BMET If you have labs (blood work) drawn today and your tests are completely normal, you will receive your results only by: MyChart Message (if you have MyChart) OR A paper copy in the mail If you have any lab test that is abnormal or we need to change your treatment, we will  call you to review the results.   Testing/Procedures: Your physician has requested that you have an echocardiogram. Echocardiography is a painless test that uses sound waves to create images of your heart. It provides your doctor with information about the size and shape of your heart and how well your heart's chambers and valves are working. This procedure takes approximately one hour. There are no restrictions for this procedure. Please do NOT wear cologne, perfume,  aftershave, or lotions (deodorant is allowed). Please arrive 15 minutes prior to your appointment time.    Follow-Up: At [facility], you and your health needs are our priority.  As part of our continuing mission to provide you with exceptional heart care, we have created designated Provider Care Teams.  These Care Teams include your primary Cardiologist (physician) and Advanced Practice Providers (APPs -  Physician Assistants and Nurse Practitioners) who all work together to provide you with the care you need, when you need it.  We recommend signing up for the patient portal called "MyChart".  Sign up information is provided on this After Visit Summary.  MyChart is used to connect with patients for Virtual Visits (Telemedicine).  Patients are able to view lab/test results, encounter notes, upcoming appointments, etc.  Non-urgent messages can be sent to your provider as well.   To learn more about what you can do with MyChart, go to [email or web address].    Your next appointment:   3 month(s)  Provider:   Cristine Polio or [name], MD     Other Instructions   Heart Failure Education: Weigh yourself EVERY morning after you go to the bathroom but before you eat or drink anything. Write this number down in a weight log/diary. If you gain 3 pounds overnight or 5 pounds in a week, call the office. Take your medicines as prescribed. If you have concerns about your medications, please call us before you stop taking them.  Eat low salt foods--Limit salt (sodium) to 2000 mg per day. This will help prevent your body from holding onto fluid. Read food labels as many processed foods have a lot of sodium, especially canned goods and prepackaged meats. If you would like some assistance choosing low sodium foods, we would be happy to set you up with a nutritionist. Limit all fluids for the day to less than 2 liters (64 ounces). Fluid includes all drinks, coffee, juice, ice chips,  soup, jello, and all other liquids. Stay as active as you can everyday. Staying active will give you more energy and make your muscles stronger. Start with 5 minutes at a time and work your way up to 30 minutes a day. Break up your activities--do some in the morning and some in the afternoon. Start with 3 days per week and work your way up to 5 days as you can.  If you have chest pain, feel short of breath, dizzy, or lightheaded, STOP. If you don't feel better after a short rest, call 911. If you do feel better, call the office to let us know you have symptoms with exercise.    Signed, Corrin Parker, PA-C  10/28/2022 8:35 PM    [facility]

## 2022-10-20 ENCOUNTER — Other Ambulatory Visit: Payer: Self-pay

## 2022-10-27 ENCOUNTER — Ambulatory Visit: Payer: Medicaid Other | Admitting: Family Medicine

## 2022-10-28 ENCOUNTER — Other Ambulatory Visit: Payer: Self-pay

## 2022-10-28 ENCOUNTER — Encounter: Payer: Self-pay | Admitting: Student

## 2022-10-28 ENCOUNTER — Ambulatory Visit: Payer: Medicaid Other | Attending: Student | Admitting: Student

## 2022-10-28 VITALS — BP 152/86 | HR 105 | Ht 64.0 in | Wt 201.2 lb

## 2022-10-28 DIAGNOSIS — I5042 Chronic combined systolic (congestive) and diastolic (congestive) heart failure: Secondary | ICD-10-CM

## 2022-10-28 DIAGNOSIS — Z79899 Other long term (current) drug therapy: Secondary | ICD-10-CM

## 2022-10-28 DIAGNOSIS — I2510 Atherosclerotic heart disease of native coronary artery without angina pectoris: Secondary | ICD-10-CM | POA: Diagnosis not present

## 2022-10-28 DIAGNOSIS — I10 Essential (primary) hypertension: Secondary | ICD-10-CM

## 2022-10-28 DIAGNOSIS — I110 Hypertensive heart disease with heart failure: Secondary | ICD-10-CM | POA: Diagnosis not present

## 2022-10-28 DIAGNOSIS — I428 Other cardiomyopathies: Secondary | ICD-10-CM

## 2022-10-28 DIAGNOSIS — E118 Type 2 diabetes mellitus with unspecified complications: Secondary | ICD-10-CM | POA: Diagnosis not present

## 2022-10-28 DIAGNOSIS — I482 Chronic atrial fibrillation, unspecified: Secondary | ICD-10-CM | POA: Diagnosis not present

## 2022-10-28 DIAGNOSIS — E785 Hyperlipidemia, unspecified: Secondary | ICD-10-CM | POA: Diagnosis not present

## 2022-10-28 MED ORDER — SACUBITRIL-VALSARTAN 24-26 MG PO TABS
1.0000 | ORAL_TABLET | Freq: Two times a day (BID) | ORAL | 3 refills | Status: DC
  Filled 2022-10-28: qty 180, 90d supply, fill #0

## 2022-10-28 MED ORDER — DIGOXIN 250 MCG PO TABS
0.2500 mg | ORAL_TABLET | Freq: Every day | ORAL | 3 refills | Status: DC
  Filled 2022-10-28: qty 90, 90d supply, fill #0

## 2022-10-28 MED ORDER — METOPROLOL SUCCINATE ER 200 MG PO TB24
200.0000 mg | ORAL_TABLET | Freq: Every day | ORAL | 3 refills | Status: DC
  Filled 2022-10-28: qty 90, 90d supply, fill #0

## 2022-10-28 MED ORDER — FUROSEMIDE 20 MG PO TABS
40.0000 mg | ORAL_TABLET | Freq: Every day | ORAL | 3 refills | Status: DC
  Filled 2022-10-28: qty 180, 90d supply, fill #0

## 2022-10-28 NOTE — Progress Notes (Signed)
Patient attempted to be outreached by Levi Aland, PharmD Candidate on 10/27/22 to discuss hypertension. Left voicemail for patient to return our call at their convenience at [phone].   Levi Aland, PharmD Candidate, Class of [phone]  [place], [place].D. PGY-2 Ambulatory Care Pharmacy Resident 10/28/2022 9:49 AM

## 2022-10-28 NOTE — Patient Instructions (Addendum)
Medication Instructions:  Your physician has recommended you make the following change in your medication:  STOP: Losartan START: Entresto 24-26 mg twice daily INCREASE: Metoprolol Succinate (Toprol-XL) 200 mg once daily *If you need a refill on your cardiac medications before your next appointment, please call your pharmacy*   Lab Work: Your physician recommends that you have labs drawn today: BNP, BMET If you have labs (blood work) drawn today and your tests are completely normal, you will receive your results only by: [place] (if you have MyChart) OR A paper copy in the mail If you have any lab test that is abnormal or we need to change your treatment, we will call you to review the results.   Testing/Procedures: Your physician has requested that you have an echocardiogram. Echocardiography is a painless test that uses sound waves to create images of your heart. It provides your doctor with information about the size and shape of your heart and how well your heart's chambers and valves are working. This procedure takes approximately one hour. There are no restrictions for this procedure. Please do NOT wear cologne, perfume, aftershave, or lotions (deodorant is allowed). Please arrive 15 minutes prior to your appointment time.    Follow-Up: At [facility], you and your health needs are our priority.  As part of our continuing mission to provide you with exceptional heart care, we have created designated Provider Care Teams.  These Care Teams include your primary Cardiologist (physician) and Advanced Practice Providers (APPs -  Physician Assistants and Nurse Practitioners) who all work together to provide you with the care you need, when you need it.  We recommend signing up for the patient portal called "MyChart".  Sign up information is provided on this After Visit Summary.  MyChart is used to connect with patients for Virtual Visits (Telemedicine).  Patients are able  to view lab/test results, encounter notes, upcoming appointments, etc.  Non-urgent messages can be sent to your provider as well.   To learn more about what you can do with MyChart, go to [email or web address].    Your next appointment:   3 month(s)  Provider:   Elwanda Brooklyn or Sanda Klein, MD     Other Instructions   Heart Failure Education: Weigh yourself EVERY morning after you go to the bathroom but before you eat or drink anything. Write this number down in a weight log/diary. If you gain 3 pounds overnight or 5 pounds in a week, call the office. Take your medicines as prescribed. If you have concerns about your medications, please call us before you stop taking them.  Eat low salt foods--Limit salt (sodium) to 2000 mg per day. This will help prevent your body from holding onto fluid. Read food labels as many processed foods have a lot of sodium, especially canned goods and prepackaged meats. If you would like some assistance choosing low sodium foods, we would be happy to set you up with a nutritionist. Limit all fluids for the day to less than 2 liters (64 ounces). Fluid includes all drinks, coffee, juice, ice chips, soup, jello, and all other liquids. Stay as active as you can everyday. Staying active will give you more energy and make your muscles stronger. Start with 5 minutes at a time and work your way up to 30 minutes a day. Break up your activities--do some in the morning and some in the afternoon. Start with 3 days per week and work your way up to 5 days as you can.  If you have chest pain, feel short of breath, dizzy, or lightheaded, STOP. If you don't feel better after a short rest, call 911. If you do feel better, call the office to let us know you have symptoms with exercise.

## 2022-10-29 ENCOUNTER — Other Ambulatory Visit: Payer: Self-pay

## 2022-10-29 ENCOUNTER — Ambulatory Visit: Payer: Medicaid Other | Attending: Family Medicine | Admitting: Physician Assistant

## 2022-10-29 ENCOUNTER — Encounter: Payer: Self-pay | Admitting: Physician Assistant

## 2022-10-29 VITALS — BP 136/88 | HR 94 | Temp 98.3°F | Wt 199.2 lb

## 2022-10-29 DIAGNOSIS — E119 Type 2 diabetes mellitus without complications: Secondary | ICD-10-CM | POA: Diagnosis not present

## 2022-10-29 DIAGNOSIS — Z7984 Long term (current) use of oral hypoglycemic drugs: Secondary | ICD-10-CM

## 2022-10-29 DIAGNOSIS — R29898 Other symptoms and signs involving the musculoskeletal system: Secondary | ICD-10-CM

## 2022-10-29 DIAGNOSIS — Z09 Encounter for follow-up examination after completed treatment for conditions other than malignant neoplasm: Secondary | ICD-10-CM | POA: Diagnosis not present

## 2022-10-29 DIAGNOSIS — G4489 Other headache syndrome: Secondary | ICD-10-CM | POA: Diagnosis not present

## 2022-10-29 LAB — BASIC METABOLIC PANEL
BUN/Creatinine Ratio: 14 (ref 9–23)
BUN: 15 mg/dL (ref 6–24)
CO2: 24 mmol/L (ref 20–29)
Calcium: 9.7 mg/dL (ref 8.7–10.2)
Chloride: 106 mmol/L (ref 96–106)
Creatinine, Ser: 1.05 mg/dL — ABNORMAL HIGH (ref 0.57–1.00)
Glucose: 91 mg/dL (ref 70–99)
Potassium: 4.4 mmol/L (ref 3.5–5.2)
Sodium: 143 mmol/L (ref 134–144)
eGFR: 62 mL/min/{1.73_m2} (ref >59)

## 2022-10-29 LAB — PRO B NATRIURETIC PEPTIDE: NT-Pro BNP: 755 pg/mL — ABNORMAL HIGH (ref 0–287)

## 2022-10-29 MED ORDER — ATORVASTATIN CALCIUM 80 MG PO TABS
80.0000 mg | ORAL_TABLET | Freq: Every day | ORAL | 1 refills | Status: DC
  Filled 2022-10-29: qty 90, 90d supply, fill #0

## 2022-10-29 MED ORDER — GABAPENTIN 300 MG PO CAPS
ORAL_CAPSULE | Freq: Two times a day (BID) | ORAL | 0 refills | Status: DC
  Filled 2022-10-29: qty 90, 45d supply, fill #0

## 2022-10-29 NOTE — Progress Notes (Signed)
Patient ID: [name], female   DOB: [birth year], 57 y.o.   MRN: [id number]    [name], is a 57 y.o. female  [id number]  [id number]  DOB - 03/11/1966  Chief Complaint  Patient presents with   Hypertension       Subjective:   [name] is a 57 y.o. female here today for a follow up visit After being seen at ED1/27/2024 for HA.  Headaches have improved some.  She is unaware that topamax was prescribed for her 09/18/2022 so she has not picked that up yet.  She had a referral for neurology previously but missed appt.  Wants new referral.  Needs Rf gabapentin and atorvastatin.  Just seen by cardiology yesterday.    From ED note MDM:   [name] is a 57 y.o. 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    Patient well-appearing, neuro nonfocal.  She has headache.  Favor migrainous.  Screening labs obtained in triage along with CT head which was reviewed which are stable.  Will give patient IV fluids, Phenergan, Benadryl, lidocaine patch and Tylenol.  advised to refrain from NSAID use given her use of DOAC.  On recheck her symptoms have resolved entirely.  She is feeling back to normal.  She will tolerate p.o., she is able to able to the bathroom not difficulty.  Repeat neurocheck is nonfocal.  She is stable for discharge.       Patient presents with headache. Based on the patient's history and physical there is very low clinical suspicion for significant intracranial pathology. The headache was not sudden onset, not maximal at onset, there are no neurologic findings on exam, the patient does not have a fever, the patient does not have any jaw claudication, the patient does not endorse a clotting disorder, patient denies any trauma or eye pain and the headache is not associated with dizziness, weakness on one side of  the body, diplopia, vertigo, slurred speech, or ataxia. Given the extremely low risk of these diagnoses further testing and evaluation for these possibilities does not appear to be indicated at this time.   The patient improved significantly and was discharged in stable condition    No problems updated.  ALLERGIES: Allergies  Allergen Reactions   Penicillins Hives and Other (See Comments)    Unknown  Has patient had a PCN reaction causing immediate rash, facial/tongue/throat swelling, SOB or lightheadedness with hypotension: No Has patient had a PCN reaction causing severe rash involving mucus membranes or skin necrosis: No Has patient had a PCN reaction that required hospitalization No Has patient had a PCN reaction occurring within the last 10 years: No If all of the above answers are "NO", then may proceed with Cephalosporin use.    PAST MEDICAL HISTORY: Past Medical History:  Diagnosis Date   Clotting disorder ([place])    on xarelto   Essential hypertension    Heart murmur    a. 10/2015 Echo: EF 60-65%, no rwma, mild AI/MR, sev dil LA/RA, PASP 47mHg.   Noncompliance    NSVT (nonsustained ventricular tachycardia) ([place]    a. 10/2015 during admission for CVA/AF.   Persistent atrial fibrillation ([place]    a. CHA2DS2VASC = 4-->Xarelto;  b. 02/2016 Successful DCCV.  c. Recurrent fib   Pneumonia 2012 x 2; 2015   Stroke ([facility]    a. 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   Transient ischemic attack (TIA) 01/2013    MEDICATIONS AT HOME: Prior to Admission medications   Medication Sig Start Date End Date Taking? Authorizing Provider  Accu-Chek FastClix Lancets MISC USE AS INSTRUCTED TO CHECK BLOOD SUGAR ONCE DAILY. 08/14/22   Argentina Donovan, PA-C  acetaminophen (TYLENOL) 500 MG tablet Take 1,000 mg by mouth every 6 (six) hours as needed for headache.     [provider]  atorvastatin  (LIPITOR) 80 MG tablet Take 1 tablet (80 mg total) by mouth daily at 6 PM. 10/29/22   , Dionne Bucy, PA-C  Blood Glucose Monitoring Suppl (ACCU-CHEK GUIDE ME) w/Device KIT 1 kit by Does not apply route daily. 05/02/19   [name], MD  dapagliflozin propanediol (FARXIGA) 10 MG TABS tablet Take 1 tablet by mouth daily. Need office visit for refills. 07/10/21   Croitoru, Mihai, MD  digoxin (LANOXIN) 0.25 MG tablet Take 1 tablet (0.25 mg total) by mouth daily. 10/28/22   Darreld Mclean, PA-C  doxycycline (VIBRA-TABS) 100 MG tablet Take 1 tablet (100 mg total) by mouth 2 (two) times daily. 08/14/22   Argentina Donovan, PA-C  furosemide (LASIX) 20 MG tablet Take 2 tablets (40 mg total) by mouth daily. 10/28/22   Sande Rives E, PA-C  gabapentin (NEURONTIN) 300 MG capsule TAKE 1 CAPSULE (300 MG TOTAL) BY MOUTH 2 (TWO) TIMES DAILY. 10/29/22 10/29/23  Argentina Donovan, PA-C  glucose blood (ACCU-CHEK GUIDE) test strip Use as instructed 08/14/22   Argentina Donovan, PA-C  metoprolol (TOPROL XL) 200 MG 24 hr tablet Take 1 tablet (200 mg total) by mouth daily. 10/28/22   Darreld Mclean, PA-C  Misc. Devices MISC Blood pressure monitor.  Diagnosis Hypertension 02/11/19   [name], MD  mupirocin ointment (BACTROBAN) 2 % Apply 1 Application topically 2 (two) times daily. 08/14/22   Argentina Donovan, PA-C  promethazine (PHENERGAN) 12.5 MG tablet Take 1 tablet (12.5 mg total) by mouth every 6 (six) hours as needed for up to 15 doses for nausea or vomiting. 10/11/22   Jeanell Sparrow, DO  rivaroxaban (XARELTO) 20 MG TABS tablet TAKE 1 TABLET (20 MG TOTAL) BY MOUTH DAILY WITH SUPPER. 03/25/22 03/25/23  Croitoru, Mihai, MD  sacubitril-valsartan (ENTRESTO) 24-26 MG Take 1 tablet by mouth 2 (two) times daily. 10/28/22   Darreld Mclean, PA-C  spironolactone (ALDACTONE) 25 MG tablet Take 1 tablet (25 mg total) by mouth daily. 08/18/22   [name], MD  topiramate (TOPAMAX) 25 MG tablet Take 1 tablet  (25 mg total) by mouth at bedtime. 09/18/22     albuterol (PROVENTIL HFA;VENTOLIN HFA) 108 (90 Base) MCG/ACT inhaler Inhale 2 puffs into the lungs every 6 (six) hours as needed for wheezing or shortness of breath. Patient not taking: Reported on 03/28/2021 11/17/18 03/28/21  [name], MD  fluticasone (FLONASE) 50 MCG/ACT nasal spray Place 2 sprays into both nostrils daily. Patient not taking: Reported on 03/28/2021 12/14/18 03/28/21  [name] Stain, MD    ROS: Neg HEENT Neg resp Neg cardiac Neg GI Neg GU Neg MS Neg psych   Objective:   Vitals:   10/29/22 1529  BP: 136/88  Pulse: 94  Temp: 98.3 F (36.8 C)  SpO2: 99%  Weight: 199 lb 3.2 oz (90.4 kg)   Exam General appearance : Awake, alert, not in any distress. Speech Clear. Not toxic looking HEENT: Atraumatic and  Normocephalic, pupils equally reactive to light and accomodation.  EOM intact.   Neck: Supple, no JVD. No cervical lymphadenopathy.  Chest: Good air entry bilaterally, CTAB.  No rales/rhonchi/wheezing CVS: S1 S2 regular, no murmurs.  Extremities: B/L Lower Ext shows no edema, both legs are warm to touch Neurology: Awake alert, and oriented X 3, CN II-XII intact, Non focal Skin: No Rash  Data Review Lab Results  Component Value Date   HGBA1C 6.0 (A) 04/21/2022   HGBA1C 6.1 07/16/2021   HGBA1C 6.7 (H) 03/28/2021    Assessment & Plan   1. Type 2 diabetes mellitus without complication, without long-term current use of insulin (HCC) On farxiga - Hemoglobin A1c - atorvastatin (LIPITOR) 80 MG tablet; Take 1 tablet (80 mg total) by mouth daily at 6 PM.  Dispense: 90 tablet; Refill: 1  2. Right arm weakness Not new - gabapentin (NEURONTIN) 300 MG capsule; TAKE 1 CAPSULE (300 MG TOTAL) BY MOUTH 2 (TWO) TIMES DAILY.  Dispense: 90 capsule; Refill: 0  3. Other headache syndrome Pick up topamax; refer neurology  4. Encounter for examination following treatment at hospital    Return in about 5 months (around  03/29/2023) for PCP for chronic conditions.  The patient was given clear instructions to go to ER or return to medical center if symptoms don't improve, worsen or new problems develop. The patient verbalized understanding. The patient was told to call to get lab results if they haven't heard anything in the next week.      Freeman Caldron, PA-C [facility] and [facility] [place], [place]   10/29/2022, 3:51 PM

## 2022-10-29 NOTE — Patient Instructions (Signed)
Pick up topamax for headache prevention

## 2022-10-30 LAB — HEMOGLOBIN A1C
Est. average glucose Bld gHb Est-mCnc: 134 mg/dL
Hgb A1c MFr Bld: 6.3 % — ABNORMAL HIGH (ref 4.8–5.6)

## 2022-10-31 ENCOUNTER — Other Ambulatory Visit: Payer: Self-pay

## 2022-10-31 ENCOUNTER — Ambulatory Visit
Admission: RE | Admit: 2022-10-31 | Discharge: 2022-10-31 | Disposition: A | Discharge status: Home / Self Care | Payer: Medicaid Other | Source: Ambulatory Visit | Attending: Family Medicine | Admitting: Family Medicine

## 2022-10-31 DIAGNOSIS — Z79899 Other long term (current) drug therapy: Secondary | ICD-10-CM

## 2022-10-31 DIAGNOSIS — Z1231 Encounter for screening mammogram for malignant neoplasm of breast: Secondary | ICD-10-CM | POA: Diagnosis not present

## 2022-10-31 DIAGNOSIS — I5042 Chronic combined systolic (congestive) and diastolic (congestive) heart failure: Secondary | ICD-10-CM

## 2022-11-03 ENCOUNTER — Other Ambulatory Visit: Payer: Self-pay

## 2022-11-04 ENCOUNTER — Other Ambulatory Visit: Payer: Self-pay

## 2022-11-04 NOTE — Progress Notes (Signed)
Patient seen by Levi Aland, PharmD Candidate on 11/03/22 while they were picking up prescriptions at [place] at [facility]   Blood pressure today was : 142/94, HR 101; on recheck: 133/88, HR 109. Medications had not yet been taken today. Pt states she prefers not to take them until after she gets off work due to side effects (going to the bathroom or getting drowsy).    Patient has an automated home blood pressure machine. They report home readings as being in the upper 130's/80 and sometimes in the 140s.    Medication review was performed. They are taking medications as prescribed. Had appointment 2/13 where the cardiologist replaced their losartan with Entresto. Picked up [employer] today. Advised pt to continue to take BP especially with the new medication change and write it down. Handed pt BP log chart to take home. Advised pt to bring this with her or her machine to show her PCP the BP readings on her next appointment (11/18/22).   The following barriers to adherence were noted:  - They do not have cost concerns.  - They do not have transportation concerns.  - They do not need assistance obtaining refills.  - They do not occasionally forget to take some of their prescribed medications.  - They do not feel like one/some of their medications make them feel poorly.  - They do have questions or concerns about their medications. Side effects such as having to go to the bathroom often limits her adherence the most. She states that she does not like to take it while working as it is hard for her to get to the bathroom there. She then runs into the problem of taking it too late and being up late at night due to having to go to the bathroom. This will be further complicated soon as she plans to start a new part-time job. Discussed with her best timing and how long to expect that side effect to happen.  - They do have follow up scheduled with their primary care  provider/cardiologist.   The following interventions were completed:  - Medications were reviewed  - Patient was educated on goal blood pressures and long term health implications of elevated blood pressure  - Patient was educated on medications, including indication and administration  - Discussed timing of medications and best time to take them with her work/sleep schedule (majority of time spent on medications that would make her go to the bathroom as that was her biggest concern)  - Discussed factors that may influence blood pressure such as caffeine use, stress, and lack of sleep   The patient has follow up scheduled: 11/18/22  PCP: Angelena Sole, PharmD Candidate, Class of 2024  [phone]  [place], [place].D. PGY-2 Ambulatory Care Pharmacy Resident 11/04/2022 7:27 AM

## 2022-11-18 ENCOUNTER — Ambulatory Visit: Payer: Medicaid Other | Admitting: Family Medicine

## 2022-11-18 ENCOUNTER — Other Ambulatory Visit: Payer: Self-pay

## 2022-11-18 ENCOUNTER — Ambulatory Visit: Payer: Medicaid Other | Attending: Family Medicine | Admitting: Family Medicine

## 2022-11-18 ENCOUNTER — Encounter: Payer: Self-pay | Admitting: Family Medicine

## 2022-11-18 DIAGNOSIS — G4489 Other headache syndrome: Secondary | ICD-10-CM

## 2022-11-18 DIAGNOSIS — I4891 Unspecified atrial fibrillation: Secondary | ICD-10-CM | POA: Diagnosis not present

## 2022-11-18 DIAGNOSIS — K529 Noninfective gastroenteritis and colitis, unspecified: Secondary | ICD-10-CM

## 2022-11-18 DIAGNOSIS — R29898 Other symptoms and signs involving the musculoskeletal system: Secondary | ICD-10-CM

## 2022-11-18 DIAGNOSIS — M79674 Pain in right toe(s): Secondary | ICD-10-CM

## 2022-11-18 DIAGNOSIS — E1169 Type 2 diabetes mellitus with other specified complication: Secondary | ICD-10-CM | POA: Diagnosis not present

## 2022-11-18 MED ORDER — GABAPENTIN 300 MG PO CAPS
300.0000 mg | ORAL_CAPSULE | Freq: Two times a day (BID) | ORAL | 1 refills | Status: DC
  Filled 2022-11-18 – 2022-11-25 (×2): qty 180, 90d supply, fill #0

## 2022-11-18 MED ORDER — TOPIRAMATE 25 MG PO TABS
25.0000 mg | ORAL_TABLET | Freq: Every day | ORAL | 1 refills | Status: DC
  Filled 2022-11-18 – 2022-11-25 (×2): qty 90, 90d supply, fill #0

## 2022-11-18 MED ORDER — DAPAGLIFLOZIN PROPANEDIOL 10 MG PO TABS
10.0000 mg | ORAL_TABLET | Freq: Every day | ORAL | 1 refills | Status: DC
  Filled 2022-11-18 – 2022-11-25 (×2): qty 90, 90d supply, fill #0

## 2022-11-18 MED ORDER — ONDANSETRON HCL 4 MG PO TABS
4.0000 mg | ORAL_TABLET | Freq: Three times a day (TID) | ORAL | 0 refills | Status: DC | PRN
  Filled 2022-11-18 – 2022-11-25 (×2): qty 20, 7d supply, fill #0

## 2022-11-18 NOTE — Progress Notes (Signed)
Virtual Visit via Video Note  I connected with [name], on 11/18/2022 at 3:15 PM by video enabled telemedicine device and verified that I am speaking with the correct person using two identifiers.   Consent: I discussed the limitations, risks, security and privacy concerns of performing an evaluation and management service by telemedicine and the availability of in person appointments. I also discussed with the patient that there may be a patient responsible charge related to this service. The patient expressed understanding and agreed to proceed.   Location of Patient: Home  Location of Provider: Clinic   Persons participating in Telemedicine visit: [name] Dr. Margarita Rana     History of Present Illness: [name] is a 57 y.o. year old female  with a history of atrial fibrillation/atrial flutter (status post cardioversion in 02/2016 currently on rate control with metoprolol and Cardizem and anticoagulation Xarelto s/p DCCV in 2017 ), cerebral infarction due to embolism of right middle cerebral artery status post IV TPA (in 10/2015),  right MCA stroke in 01/2018 (after running out of Xarelto), Type 2 DM (A1c 6.0)  Seen for an acute visit today.  Her stomach started cramping right after work and she has had 1 episode of diarrhea.  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  Denies presence of nausea or vomiting and has had no fever.  She is unsure if any other persons at work developed similar symptoms.  Her 2nd toe on the right foot throbs all the time and symptoms have been present for about 3 months. It feels like a jolt of lightning shooting through it but she has no erythema or edema.  She would like to be checked for gout.  Denies history of trauma. Medication list reveals she should be on gabapentin however she has not been taking it consistently. Endorses adherence with Wilder Glade for her diabetes and has no hypoglycemia or visual  concerns. She follows closely with her cardiologist and is adherent with her Xarelto and cardiac medications.   Past Medical History:  Diagnosis Date   Clotting disorder ([place])    on xarelto   Essential hypertension    Heart murmur    a. 10/2015 Echo: EF 60-65%, no rwma, mild AI/MR, sev dil LA/RA, PASP 53mHg.   Noncompliance    [place]    a. 10/2015 during admission for CVA/AF.   [place]    a. CHA2DS2VASC = 4-->Xarelto;  b. 02/2016 Successful DCCV.  c. Recurrent fib   Pneumonia 2012 x 2; 2015   Stroke ([facility]    a. 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   Transient ischemic attack (TIA) 01/2013   Allergies  Allergen Reactions   Penicillins Hives and Other (See Comments)    Unknown  Has patient had a PCN reaction causing immediate rash, facial/tongue/throat swelling, SOB or lightheadedness with hypotension: No Has patient had a PCN reaction causing severe rash involving mucus membranes or skin necrosis: No Has patient had a PCN reaction that required hospitalization No Has patient had a PCN reaction occurring within the last 10 years: No If all of the above answers are "NO", then may proceed with Cephalosporin use.    Current Outpatient Medications on File Prior to Visit  Medication Sig Dispense Refill   Accu-Chek FastClix Lancets MISC USE AS INSTRUCTED TO CHECK BLOOD SUGAR ONCE DAILY. 102 each 0  acetaminophen (TYLENOL) 500 MG tablet Take 1,000 mg by mouth every 6 (six) hours as needed for headache.      atorvastatin (LIPITOR) 80 MG tablet Take 1 tablet (80 mg total) by mouth daily at 6 PM. 90 tablet 1   Blood Glucose Monitoring Suppl (ACCU-CHEK GUIDE ME) w/Device KIT 1 kit by Does not apply route daily. 1 kit 0   dapagliflozin propanediol (FARXIGA) 10 MG TABS tablet Take 1 tablet by mouth daily. Need office visit for refills.  90 tablet 3   digoxin (LANOXIN) 0.25 MG tablet Take 1 tablet (0.25 mg total) by mouth daily. 90 tablet 3   doxycycline (VIBRA-TABS) 100 MG tablet Take 1 tablet (100 mg total) by mouth 2 (two) times daily. 14 tablet 0   furosemide (LASIX) 20 MG tablet Take 2 tablets (40 mg total) by mouth daily. 180 tablet 3   gabapentin (NEURONTIN) 300 MG capsule TAKE 1 CAPSULE (300 MG TOTAL) BY MOUTH 2 (TWO) TIMES DAILY. 90 capsule 0   glucose blood (ACCU-CHEK GUIDE) test strip Use as instructed 100 strip 0   metoprolol (TOPROL XL) 200 MG 24 hr tablet Take 1 tablet (200 mg total) by mouth daily. 90 tablet 3   Misc. Devices MISC Blood pressure monitor.  Diagnosis Hypertension 1 each 0   mupirocin ointment (BACTROBAN) 2 % Apply 1 Application topically 2 (two) times daily. 22 g 0   promethazine (PHENERGAN) 12.5 MG tablet Take 1 tablet (12.5 mg total) by mouth every 6 (six) hours as needed for up to 15 doses for nausea or vomiting. 15 tablet 0   rivaroxaban (XARELTO) 20 MG TABS tablet TAKE 1 TABLET (20 MG TOTAL) BY MOUTH DAILY WITH SUPPER. 90 tablet 1   sacubitril-valsartan (ENTRESTO) 24-26 MG Take 1 tablet by mouth 2 (two) times daily. 180 tablet 3   spironolactone (ALDACTONE) 25 MG tablet Take 1 tablet (25 mg total) by mouth daily. 90 tablet 1   topiramate (TOPAMAX) 25 MG tablet Take 1 tablet (25 mg total) by mouth at bedtime. 30 tablet 2   [DISCONTINUED] albuterol (PROVENTIL HFA;VENTOLIN HFA) 108 (90 Base) MCG/ACT inhaler Inhale 2 puffs into the lungs every 6 (six) hours as needed for wheezing or shortness of breath. (Patient not taking: Reported on 03/28/2021) 1 Inhaler 2   [DISCONTINUED] fluticasone (FLONASE) 50 MCG/ACT nasal spray Place 2 sprays into both nostrils daily. (Patient not taking: Reported on 03/28/2021) 16 g 6   No current facility-administered medications on file prior to visit.    ROS: See HPI   Observations/Objective: Awake, alert, oriented x3 Not in acute distress Normal mood       Latest Ref Rng & Units 10/28/2022    4:18 PM 10/11/2022    1:44 PM 05/08/2022    2:29 PM  CMP  Glucose 70 - 99 mg/dL 91  115  96   BUN 6 - 24 mg/dL '15  8  13   '$ Creatinine 0.57 - 1.00 mg/dL 1.05  0.89  0.95   Sodium 134 - 144 mmol/L 143  140  145   Potassium 3.5 - 5.2 mmol/L 4.4  3.5  4.5   Chloride 96 - 106 mmol/L 106  106  106   CO2 20 - 29 mmol/L '24  24  21   '$ Calcium 8.7 - 10.2 mg/dL 9.7  8.8  9.7   Total Protein 6.5 - 8.1 g/dL  7.4    Total Bilirubin 0.3 - 1.2 mg/dL  0.5    Alkaline Phos 38 - 126  U/L  89    AST 15 - 41 U/L  27    ALT 0 - 44 U/L  23      Lipid Panel     Component Value Date/Time   CHOL 115 04/23/2022 1116   TRIG 68 04/23/2022 1116   HDL 40 04/23/2022 1116   CHOLHDL 6.2 03/29/2021 0036   VLDL 14 03/29/2021 0036   LDLCALC 61 04/23/2022 1116   LABVLDL 14 04/23/2022 1116    Lab Results  Component Value Date   HGBA1C 6.3 (H) 10/29/2022     Assessment and Plan: 1. Type 2 diabetes mellitus with other specified complication, without long-term current use of insulin (HCC) Controlled with A1c of 6.3 Counseled on Diabetic diet, my plate Murphy, [id number] minutes of moderate intensity exercise/week Blood sugar logs with fasting goals of 80-120 mg/dl, random of less than 180 and in the event of sugars less than 60 mg/dl or greater than 400 mg/dl encouraged to notify the clinic. Advised on the need for annual eye exams, annual foot exams, Pneumonia vaccine. - LP+Non-HDL Cholesterol; Future - dapagliflozin propanediol (FARXIGA) 10 MG TABS tablet; Take 1 tablet (10 mg total) by mouth daily.  Dispense: 90 tablet; Refill: 1  2. Pain in right toe(s) Symptoms sound like this may be neuropathy but will need to exclude gout and possible osteoarthritis Advised to restart gabapentin - Uric Acid; Future - DG Foot Complete Right; Future  3. Right arm weakness She has been on this for neuropathy as well - gabapentin (NEURONTIN) 300 MG capsule; TAKE 1 CAPSULE (300 MG TOTAL) BY  MOUTH 2 (TWO) TIMES DAILY.  Dispense: 180 capsule; Refill: 1  4. Gastroenteritis Counseled to stay hydrated, will send antiemetic in the event that she develops nausea - ondansetron (ZOFRAN) 4 MG tablet; Take 1 tablet (4 mg total) by mouth every 8 (eight) hours as needed for nausea or vomiting.  Dispense: 20 tablet; Refill: 0  5. Other headache syndrome This was prescribed by neurology at [facility] I have sent a refill for her - topiramate (TOPAMAX) 25 MG tablet; Take 1 tablet (25 mg total) by mouth at bedtime.  Dispense: 180 tablet; Refill: 1  6.  Atrial fibrillation with RVR Continue anticoagulation with Xarelto and rate control with metoprolol Follow-up with cardiology  Follow Up Instructions: 3-month  I discussed the assessment and treatment plan with the patient. The patient was provided an opportunity to ask questions and all were answered. The patient agreed with the plan and demonstrated an understanding of the instructions.   The patient was advised to call back or seek an in-person evaluation if the symptoms worsen or if the condition fails to improve as anticipated.     I provided 23 minutes total of Telehealth time during this encounter including median intraservice time, reviewing previous notes, investigations, ordering medications, medical decision making, coordinating care and patient verbalized understanding at the end of the visit.     ECharlott Rakes MD, FAAFP. [facility] [place]  11/18/2022, 3:15 PM

## 2022-11-19 ENCOUNTER — Other Ambulatory Visit (HOSPITAL_COMMUNITY): Payer: Medicaid Other

## 2022-11-21 ENCOUNTER — Other Ambulatory Visit: Payer: Medicaid Other

## 2022-11-24 ENCOUNTER — Other Ambulatory Visit: Payer: Self-pay

## 2022-11-25 ENCOUNTER — Other Ambulatory Visit: Payer: Self-pay

## 2022-12-01 ENCOUNTER — Other Ambulatory Visit: Payer: Self-pay

## 2022-12-01 ENCOUNTER — Ambulatory Visit: Payer: Medicaid Other

## 2022-12-17 ENCOUNTER — Ambulatory Visit (HOSPITAL_COMMUNITY): Payer: Medicaid Other

## 2023-01-02 ENCOUNTER — Encounter: Payer: Self-pay | Admitting: Gastroenterology

## 2023-01-26 NOTE — Progress Notes (Deleted)
Cardiology Office Note:    Date:  01/26/2023   ID:  [name], DOB [birth year], MRN [id number]  PCP:  Hoy Register, MD  Cardiologist:  [name], MD  Electrophysiologist:  None   Referring MD: Hoy Register, MD   Chief Complaint: follow-up of CHF  History of Present Illness:    [name] is a 57 y.o. female with a history of non-obstructive CAD on cardiac catheterization in 03/2021, non-ischemic cardiomyopathy/ chronic combined CHF with EF of 25-30% in 03/2021, chronic atrial fibrillation/ flutter on Xarelto, hypertension, dyslipidemia, type 2 diabetes mellitus, prior stroke, severe obstructive sleep apnea, and obesity who is followed by Dr. [name] and presents today for follow-up of CHF.    Patient is primary followed by Cardiology for CHF and atrial fibrillation. She has a long history of atrial fibrillation which is felt to be permanent at this point. She was admitted in 03/2021 with acute CHF after presenting with worsening shortness of breath, edema, and weight gain. Echo showed LVEF of 25-30% with global hypokinesis and indeterminate diastolic parameters, moderately enlarged RV with mildly reduced RV, mild AI, moderate MR, and severe TR. EF was down from 60-65% on prior Echo in 2020. She was diuresed with IV Lasix and then underwent R/LHC which showed 60% stenosis of mid LAD, 75% stenosis of distal LCX, and 70% stenosis of proximal 1st Diag as well as mild pulmonary hypertension. Medical therapy was recommended. She was started GDMT and transitioned to PO Lasix. Cardiomyopathy was felt to be tachymediated in nature.   Patient was last seen by me in 10/2022 at which time she reported lower extremity edema as well as a 10 lb weight gain over the last 2 months. However, she was otherwise doing well from a cardiac standpoint. Pro-BNP was elevated at 755 but did not rule her in for CHF based on age. Losartan was stopped and she was switched to Entresto and Toprol-XL was increased  for better rate control. Repeat Echo was ordered to reassess LV function.  Patient presents today for follow-up. She has still not had repeat Echo yet - it is scheduled for next months. ***  Chronic Combined CHF Non-Ischemic Cardiomyopathy (Presumed Tachy-Mediated) Echo in 03/2021 showed Echo showed LVEF of 25-30% with global hypokinesis and indeterminate diastolic parameters, moderately enlarged RV with mildly reduced RV, mild AI, moderate MR, and severe TR. R/LHC at the time showed non-obstructive CAD that was out of proportion to degree of cardiomyopathy.  - Euvolemic on exam. Weight *** - Repeat Echo was ordered at last visit and is scheduled to be done next month.  - Continue Lasix 40mg  daily. - Continue Entresto 24-26mg  twice daily.  - Currently on Toprol-XL 2000mg  daily.  - Continue Spironolactone 25mg  daily. - Continue Farxiga 10mg  daily. - Continue  daily weight and sodium/ fluid restrictions.  - Will repeat BMET. ***   Chronic Atrial Fibrillation Long history of atrial fibrillation that is felt to likely be permanent at this time.  - Rates *** - Continue Toprol-XL 200mg  daily.  - Continue Digoxin 0.25mg  daily. Digoxin level was normal att office visit in 04/2022. - Continue chronic anticoagulation with Xarelto 20mg  daily. - If EF has normalized on repeat Echo, may restart Diltiazem and stop Digoxin per Dr. Erin Hearing recommendation at last visit.    CAD LHC in 03/2021 showed 60% stenosis of mid LAD, 75% stenosis of distal LCX, and 70% stenosis of proximal 1st Diag. Medical therapy was recommended at that time. - No chest pain.  -  No aspirin due to need for DOAC. - Continue beta-blocker and statin.    Hypertension BP *** - Continue medications for CHF as above.    Dyslipidemia Most recent lipid panel on 04/23/2022: Total Cholesterol 115, Triglycerides 68, HDL 40, LDL 61.  - Continue Lipitor 80mg  daily.    Type 2 Diabetes Mellitus Hemoglobin A1 6.3 in 10/2022. - On Farxiga  as above. - Management per PCP.    Past Medical History:  Diagnosis Date   Clotting disorder (HCC)    on xarelto   Essential hypertension    Heart murmur    a. 10/2015 Echo: EF 60-65%, no rwma, mild AI/MR, sev dil LA/RA, PASP .   Noncompliance    NSVT (nonsustained ventricular tachycardia) (HCC)    a. 10/2015 during admission for CVA/AF.   Persistent atrial fibrillation (HCC)    a. CHA2DS2VASC = 4-->Xarelto;  b. 02/2016 Successful DCCV.  c. Recurrent fib   Pneumonia 2012 x 2; 2015   Stroke [facility])    a. 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   Transient ischemic attack (TIA) 01/2013    Past Surgical History:  Procedure Laterality Date   CARDIOVERSION N/A 02/18/2013   Procedure: CARDIOVERSION;  Surgeon: [name], MD;  Location: [facility] ENDOSCOPY;  Service: Cardiovascular;  Laterality: N/A;   CARDIOVERSION N/A 02/23/2013   Procedure: CARDIOVERSION;  Surgeon: Marykay Lex, MD;  Location: [facility] OR;  Service: Cardiovascular;  Laterality: N/A;  BESIDE CV   CARDIOVERSION N/A 03/12/2016   Procedure: CARDIOVERSION;  Surgeon: Laurey Morale, MD;  Location[facility] ENDOSCOPY;  Service: Cardiovascular;  Laterality: N/A;   RIGHT/LEFT HEART CATH AND CORONARY ANGIOGRAPHY N/A 04/02/2021   Procedure: RIGHT/LEFT HEART CATH AND CORONARY ANGIOGRAPHY;  Surgeon: Lyn Records, MD;  Location: [facility] INVASIVE CV LAB;  Service: Cardiovascular;  Laterality: N/A;   TEE WITHOUT CARDIOVERSION N/A 02/18/2013   Procedure: TRANSESOPHAGEAL ECHOCARDIOGRAM (TEE);  Surgeon: [name], MD;  Location: [facility] ENDOSCOPY;  Service: Cardiovascular;  Laterality: N/A;   TUBAL LIGATION  1992    Current Medications: No outpatient medications have been marked as taking for the 02/03/23 encounter (Appointment) with Corrin Parker, PA-C.     Allergies:   Penicillins   Social History   Socioeconomic History   Marital status: Single    Spouse  name: Not on file   Number of children: Not on file   Years of education: Not on file   Highest education level: Not on file  Occupational History   Occupation: Financial risk analyst    Employer: [employer]   Occupation: in home health aide  Tobacco Use   Smoking status: Former    Years: 3    Types: Cigarettes    Quit date: 03/15/2013    Years since quitting: 9.8   Smokeless tobacco: Never  Vaping Use   Vaping Use: Never used  Substance and Sexual Activity   Alcohol use: Yes    Comment: drinks on the weekends,   2-3 beers   Drug use: No   Sexual activity: Not Currently    Birth control/protection: None  Other Topics Concern   Not on file  Social History Narrative   Not on file   Social Determinants of Health   Financial Resource Strain: Not on file  Food Insecurity: Not on file  Transportation Needs: Not on file  Physical Activity: Not on file  Stress: Not on file  Social Connections: Not on file  Family History: The patient's family history includes Atrial fibrillation in her father and mother; Breast cancer in her mother; Cancer in her mother; Diabetes in an other family member; Hypertension in her father and another family member; Peripheral vascular disease in her father. There is no history of Heart attack, Stroke, Rectal cancer, Colon cancer, Esophageal cancer, or Stomach cancer.  ROS:   Please see the history of present illness.     EKGs/Labs/Other Studies Reviewed:    The following studies were reviewed  Echocardiogram 05/09/2019: Impressions: 1. The left ventricle has normal systolic function with an ejection  fraction of 60-65%. The cavity size was normal. There is mildly increased  left ventricular wall thickness. not assessed due to atrial flutter. No  evidence of left ventricular regional  wall motion abnormalities.   2. Left atrial size was moderately dilated.   3. The mitral valve is abnormal. Mild thickening of the mitral valve  leaflet.   4. The  tricuspid valve is grossly normal.   5. The aortic valve is tricuspid. Mild sclerosis of the aortic valve.  Aortic valve regurgitation is mild by color flow Doppler. No stenosis of  the aortic valve.   6. The aorta is normal unless otherwise noted.   7. The inferior vena cava was dilated in size with >50% respiratory  variability.   8. When compared to the prior study: 02/09/2018: LVEF 60-65%, mild AI,  possible LA thrombus.    _______________   Echocardiogram 03/28/2021: Impressions:  1. Left ventricular ejection fraction, by estimation, is 25 to 30%. The  left ventricle has severely decreased function. The left ventricle  demonstrates global hypokinesis. There is mild left ventricular  hypertrophy. Left ventricular diastolic parameters   are indeterminate.   2. Right ventricular systolic function is mildly reduced. The right  ventricular size is moderately enlarged. There is normal pulmonary artery  systolic pressure. The estimated right ventricular systolic pressure is  17.4 mmHg.   3. Left atrial size was mildly dilated.   4. Right atrial size was moderately dilated.   5. The mitral valve is normal in structure. Moderate mitral valve  regurgitation. No evidence of mitral stenosis.   6. Tricuspid valve regurgitation is severe. Suspect functional from  annular dilatation.   7. The aortic valve is tricuspid. Aortic valve regurgitation is mild. No  aortic stenosis is present.   8. The inferior vena cava is dilated in size with >50% respiratory  variability, suggesting right atrial pressure of 8 mmHg.   9. The patient was in atrial fibrillation.  10. Significant change in LV and RV function compared to prior echo.  _______________   Right/Left Cardiac Catheterization 04/02/2021: 60% mid LAD after second diagonal. 75% distal circumflex before small third obtuse marginal. Right dominant anatomy Mild pulmonary hypertension with mean pressure 31 mmHg, mean capillary wedge pressure 16  mmHg, pulmonary vascular resistance 2.56 Wood units. Pulmonary artery O2 saturation 73%, cardiac output and index are 5.87 L/min and 3.01 L/min/m respectively.   Recommendations: Guideline directed therapy for systolic dysfunction Rate control Further management per treating team.   Diagnostic Dominance: Right        EKG:  EKG not ordered today.   Recent Labs: 10/11/2022: ALT 23; Hemoglobin 13.2; Platelets 185 10/28/2022: BUN 15; Creatinine, Ser 1.05; NT-Pro BNP 755; Potassium 4.4; Sodium 143  Recent Lipid Panel    Component Value Date/Time   CHOL 115 04/23/2022 1116   TRIG 68 04/23/2022 1116   HDL 40 04/23/2022 1116   CHOLHDL  6.2 03/29/2021 0036   VLDL 14 03/29/2021 0036   LDLCALC 61 04/23/2022 1116    Physical Exam:    Vital Signs: There were no vitals taken for this visit.    Wt Readings from Last 3 Encounters:  10/29/22 199 lb 3.2 oz (90.4 kg)  10/28/22 201 lb 3.2 oz (91.3 kg)  08/18/22 200 lb (90.7 kg)     General: 57 y.o. female in no acute distress. HEENT: Normocephalic and atraumatic. Sclera clear. EOMs intact. Neck: Supple. No carotid bruits. No JVD. Heart: *** RRR. Distinct S1 and S2. No murmurs, gallops, or rubs. Radial and distal pedal pulses 2+ and equal bilaterally. Lungs: No increased work of breathing. Clear to ausculation bilaterally. No wheezes, rhonchi, or rales.  Abdomen: Soft, non-distended, and non-tender to palpation. Bowel sounds present in all 4 quadrants.  MSK: Normal strength and tone for age. *** Extremities: No lower extremity edema.    Skin: Warm and dry. Neuro: Alert and oriented x3. No focal deficits. Psych: Normal affect. Responds appropriately.   Assessment:    No diagnosis found.  Plan:     Disposition: Follow up in ***   Medication Adjustments/Labs and Tests Ordered: Current medicines are reviewed at length with the patient today.  Concerns regarding medicines are outlined above.  No orders of the defined types were  placed in this encounter.  No orders of the defined types were placed in this encounter.   There are no Patient Instructions on file for this visit.   Signed, Corrin Parker, PA-C  01/26/2023 1:59 PM    [facility]

## 2023-02-03 ENCOUNTER — Ambulatory Visit: Payer: Medicaid Other | Admitting: Student

## 2023-02-25 ENCOUNTER — Other Ambulatory Visit: Payer: Self-pay

## 2023-02-25 ENCOUNTER — Encounter: Payer: Self-pay | Admitting: Internal Medicine

## 2023-02-25 ENCOUNTER — Ambulatory Visit: Payer: Medicaid Other | Attending: Internal Medicine | Admitting: Internal Medicine

## 2023-02-25 VITALS — BP 160/90 | HR 98 | Temp 98.3°F | Ht 64.0 in | Wt 201.0 lb

## 2023-02-25 DIAGNOSIS — I10 Essential (primary) hypertension: Secondary | ICD-10-CM

## 2023-02-25 DIAGNOSIS — E1169 Type 2 diabetes mellitus with other specified complication: Secondary | ICD-10-CM

## 2023-02-25 DIAGNOSIS — Z7984 Long term (current) use of oral hypoglycemic drugs: Secondary | ICD-10-CM | POA: Diagnosis not present

## 2023-02-25 DIAGNOSIS — M79674 Pain in right toe(s): Secondary | ICD-10-CM | POA: Diagnosis not present

## 2023-02-25 DIAGNOSIS — L309 Dermatitis, unspecified: Secondary | ICD-10-CM

## 2023-02-25 DIAGNOSIS — I152 Hypertension secondary to endocrine disorders: Secondary | ICD-10-CM | POA: Diagnosis not present

## 2023-02-25 DIAGNOSIS — E1159 Type 2 diabetes mellitus with other circulatory complications: Secondary | ICD-10-CM

## 2023-02-25 MED ORDER — TRIAMCINOLONE ACETONIDE 0.1 % EX CREA
1.0000 | TOPICAL_CREAM | Freq: Two times a day (BID) | CUTANEOUS | 0 refills | Status: DC
  Filled 2023-02-25: qty 30, 15d supply, fill #0

## 2023-02-25 NOTE — Patient Instructions (Signed)
Stop the Degree Deodorant.  Use the Dove Sensitive Skin instead but do not start using until after using the cream prescribed today for at least 3 days.

## 2023-02-25 NOTE — Progress Notes (Signed)
Patient ID: [name], female    DOB: Feb 11, 1966  MRN: [id number]  CC: Rash (Skin irritation on R underarm Itching, burning, spreading X 2 weeks)   Subjective: [name] is a 57 y.o. female who presents for UC visit Her concerns today include:  history of atrial fibrillation/atrial flutter (status post cardioversion in 02/2016 currently on rate control with metoprolol and Cardizem and anticoagulation Xarelto s/p DCCV in 2017 ), cerebral infarction due to embolism of right middle cerebral artery status post IV TPA (in 10/2015),  right MCA stroke in 01/2018 (after running out of Xarelto), Type 2 DM (A1c 6.0)    C/o rash under RT arm x 2 wks Initially red but now maroon in color. Small in size initially but has spread out.  No bug bites No change in soap/deodorant Itches and burns Uses Degree Rub on deodorant. Looks like the more she used it, the worse the rash became. BP elev.  Did not take meds as yet for the morning Dr. Alvis Lemmings had ordered future labs on last telephone visit.  Wants to have them done today. Patient Active Problem List   Diagnosis Date Noted   Type 2 diabetes mellitus without complication, without long-term current use of insulin (HCC) 06/17/2021   Coronary artery disease involving native coronary artery of native heart without angina pectoris 04/10/2021   Acute combined systolic and diastolic HF (heart failure) (HCC) 04/01/2021   Atrial fibrillation with RVR (HCC) 03/28/2021   Elevated troponin 03/28/2021   Longstanding persistent atrial fibrillation (HCC) 04/16/2020   Secondary hypercoagulable state (HCC) 04/16/2020   Edema leg 05/17/2019   Severe obesity (BMI 35.0-39.9) with comorbidity (HCC) 04/04/2019   Prediabetes 04/04/2019   Seasonal allergic rhinitis due to pollen 12/14/2018   Cough 12/14/2018   Chronic back pain 08/23/2018   OSA on CPAP 06/23/2018   Acute ischemic right MCA stroke (HCC) 02/08/2018   Headache 01/29/2017   Refractive errors 04/15/2016    Medical non-compliance 11/06/2015   Hyperlipidemia LDL goal <70 11/06/2015   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   NSVT (nonsustained ventricular tachycardia) (HCC) 11/06/2015   History of CVA (cerebrovascular accident)    Claudication of both lower extremities (HCC) 03/22/2015   Chronic atrial fibrillation (HCC) 04/12/2014   Pap smear for cervical cancer screening 03/28/2014   Cigarette smoker 06/15/2013   Ventral hernia 06/15/2013   Chronic anticoagulation 02/28/2013   Hx-TIA (transient ischemic attack) 02/05/13 02/15/2013   Essential hypertension 02/15/2013     Current Outpatient Medications on File Prior to Visit  Medication Sig Dispense Refill   Accu-Chek FastClix Lancets MISC USE AS INSTRUCTED TO CHECK BLOOD SUGAR ONCE DAILY. 102 each 0   Blood Glucose Monitoring Suppl (ACCU-CHEK GUIDE ME) w/Device KIT 1 kit by Does not apply route daily. 1 kit 0   digoxin (LANOXIN) 0.25 MG tablet Take 1 tablet (0.25 mg total) by mouth daily. 90 tablet 3   furosemide (LASIX) 20 MG tablet Take 2 tablets (40 mg total) by mouth daily. 180 tablet 3   glucose blood (ACCU-CHEK GUIDE) test strip Use as instructed 100 strip 0   metoprolol (TOPROL XL) 200 MG 24 hr tablet Take 1 tablet (200 mg total) by mouth daily. 90 tablet 3   rivaroxaban (XARELTO) 20 MG TABS tablet TAKE 1 TABLET (20 MG TOTAL) BY MOUTH DAILY WITH SUPPER. 90 tablet 1   sacubitril-valsartan (ENTRESTO) 24-26 MG Take 1 tablet by mouth 2 (two) times daily. 180 tablet 3   acetaminophen (TYLENOL) 500 MG tablet Take  1,000 mg by mouth every 6 (six) hours as needed for headache.  (Patient not taking: Reported on 02/25/2023)     atorvastatin (LIPITOR) 80 MG tablet Take 1 tablet (80 mg total) by mouth daily at 6 PM. (Patient not taking: Reported on 02/25/2023) 90 tablet 1   dapagliflozin propanediol (FARXIGA) 10 MG TABS tablet Take 1 tablet (10 mg total) by mouth daily. (Patient not taking: Reported on 02/25/2023) 90 tablet 1   doxycycline  (VIBRA-TABS) 100 MG tablet Take 1 tablet (100 mg total) by mouth 2 (two) times daily. (Patient not taking: Reported on 02/25/2023) 14 tablet 0   gabapentin (NEURONTIN) 300 MG capsule Take 1 capsule (300 mg total) by mouth 2 (two) times daily. (Patient not taking: Reported on 02/25/2023) 180 capsule 1   Misc. Devices MISC Blood pressure monitor.  Diagnosis Hypertension (Patient not taking: Reported on 02/25/2023) 1 each 0   mupirocin ointment (BACTROBAN) 2 % Apply 1 Application topically 2 (two) times daily. (Patient not taking: Reported on 02/25/2023) 22 g 0   ondansetron (ZOFRAN) 4 MG tablet Take 1 tablet (4 mg total) by mouth every 8 (eight) hours as needed for nausea or vomiting. (Patient not taking: Reported on 02/25/2023) 20 tablet 0   promethazine (PHENERGAN) 12.5 MG tablet Take 1 tablet (12.5 mg total) by mouth every 6 (six) hours as needed for up to 15 doses for nausea or vomiting. (Patient not taking: Reported on 02/25/2023) 15 tablet 0   spironolactone (ALDACTONE) 25 MG tablet Take 1 tablet (25 mg total) by mouth daily. (Patient not taking: Reported on 02/25/2023) 90 tablet 1   topiramate (TOPAMAX) 25 MG tablet Take 1 tablet (25 mg total) by mouth at bedtime. (Patient not taking: Reported on 02/25/2023) 180 tablet 1   [DISCONTINUED] albuterol (PROVENTIL HFA;VENTOLIN HFA) 108 (90 Base) MCG/ACT inhaler Inhale 2 puffs into the lungs every 6 (six) hours as needed for wheezing or shortness of breath. (Patient not taking: Reported on 03/28/2021) 1 Inhaler 2   [DISCONTINUED] fluticasone (FLONASE) 50 MCG/ACT nasal spray Place 2 sprays into both nostrils daily. (Patient not taking: Reported on 03/28/2021) 16 g 6   No current facility-administered medications on file prior to visit.    Allergies  Allergen Reactions   Penicillins Hives and Other (See Comments)    Unknown  Has patient had a PCN reaction causing immediate rash, facial/tongue/throat swelling, SOB or lightheadedness with hypotension: No Has  patient had a PCN reaction causing severe rash involving mucus membranes or skin necrosis: No Has patient had a PCN reaction that required hospitalization No Has patient had a PCN reaction occurring within the last 10 years: No If all of the above answers are "NO", then may proceed with Cephalosporin use.    Social History   Socioeconomic History   Marital status: Single    Spouse name: Not on file   Number of children: Not on file   Years of education: Not on file   Highest education level: Not on file  Occupational History   Occupation: [employer]    Employer: [employer]   Occupation: in home health aide  Tobacco Use   Smoking status: Former    Years: 3    Types: Cigarettes    Quit date: 03/15/2013    Years since quitting: 9.9   Smokeless tobacco: Never  Vaping Use   Vaping Use: Never used  Substance and Sexual Activity   Alcohol use: Yes    Comment: drinks on the weekends,   2-3  beers   Drug use: No   Sexual activity: Not Currently    Birth control/protection: None  Other Topics Concern   Not on file  Social History Narrative   Not on file   Social Determinants of Health   Financial Resource Strain: Not on file  Food Insecurity: Not on file  Transportation Needs: Not on file  Physical Activity: Not on file  Stress: Not on file  Social Connections: Not on file  Intimate Partner Violence: Not on file    Family History  Problem Relation Age of Onset   Cancer Mother    Atrial fibrillation Mother    Breast cancer Mother    Hypertension Father    Atrial fibrillation Father    Peripheral vascular disease Father    Hypertension Other    Diabetes Other    Heart attack Neg Hx    Stroke Neg Hx    Rectal cancer Neg Hx    Colon cancer Neg Hx    Esophageal cancer Neg Hx    Stomach cancer Neg Hx     Past Surgical History:  Procedure Laterality Date   CARDIOVERSION N/A 02/18/2013   Procedure: CARDIOVERSION;  Surgeon: [name], MD;  Location: [facility]  ENDOSCOPY;  Service: Cardiovascular;  Laterality: N/A;   CARDIOVERSION N/A 02/23/2013   Procedure: CARDIOVERSION;  Surgeon: Marykay Lex, MD;  Location: [facility] OR;  Service: Cardiovascular;  Laterality: N/A;  BESIDE CV   CARDIOVERSION N/A 03/12/2016   Procedure: CARDIOVERSION;  Surgeon: Laurey Morale, MD;  Location: [facility] ENDOSCOPY;  Service: Cardiovascular;  Laterality: N/A;   RIGHT/LEFT HEART CATH AND CORONARY ANGIOGRAPHY N/A 04/02/2021   Procedure: RIGHT/LEFT HEART CATH AND CORONARY ANGIOGRAPHY;  Surgeon: Lyn Records, MD;  Location: [facility] INVASIVE CV LAB;  Service: Cardiovascular;  Laterality: N/A;   TEE WITHOUT CARDIOVERSION N/A 02/18/2013   Procedure: TRANSESOPHAGEAL ECHOCARDIOGRAM (TEE);  Surgeon: [name], MD;  Location: [facility] ENDOSCOPY;  Service: Cardiovascular;  Laterality: N/A;   TUBAL LIGATION  1992    ROS: Review of Systems Negative except as stated above  PHYSICAL EXAM: BP (!) 160/90   Pulse 98   Temp 98.3 F (36.8 C) (Oral)   Ht 5\' 4"  (1.626 m)   Wt 201 lb (91.2 kg)   SpO2 99%   BMI 34.50 kg/m   Physical Exam  General appearance - alert, well appearing, and in no distress Mental status - normal mood, behavior, speech, dress, motor activity, and thought processes Skin -patient has about a 7 cm rash under the right axilla.  The skin appears irritated.  Borders of the rash erythematous with slight central clearing.      Latest Ref Rng & Units 10/28/2022    4:18 PM 10/11/2022    1:44 PM 05/08/2022    2:29 PM  CMP  Glucose 70 - 99 mg/dL 91  161  96   BUN 6 - 24 mg/dL 15  8  13    Creatinine 0.57 - 1.00 mg/dL 0.96  0.45  4.09   Sodium 134 - 144 mmol/L 143  140  145   Potassium 3.5 - 5.2 mmol/L 4.4  3.5  4.5   Chloride 96 - 106 mmol/L 106  106  106   CO2 20 - 29 mmol/L 24  24  21    Calcium 8.7 - 10.2 mg/dL 9.7  8.8  9.7   Total Protein 6.5 - 8.1 g/dL  7.4    Total Bilirubin 0.3 - 1.2 mg/dL  0.5    Alkaline  Phos 38 - 126 U/L  89    AST 15 - 41 U/L  27    ALT 0 - 44  U/L  23     Lipid Panel     Component Value Date/Time   CHOL 115 04/23/2022 1116   TRIG 68 04/23/2022 1116   HDL 40 04/23/2022 1116   CHOLHDL 6.2 03/29/2021 0036   VLDL 14 03/29/2021 0036   LDLCALC 61 04/23/2022 1116    CBC    Component Value Date/Time   WBC 8.2 10/11/2022 1344   RBC 4.92 10/11/2022 1344   HGB 13.2 10/11/2022 1344   HCT 42.8 10/11/2022 1344   PLT 185 10/11/2022 1344   MCV 87.0 10/11/2022 1344   MCH 26.8 10/11/2022 1344   MCHC 30.8 10/11/2022 1344   RDW 14.6 10/11/2022 1344   LYMPHSABS 2.1 10/11/2022 1344   MONOABS 0.6 10/11/2022 1344   EOSABS 0.1 10/11/2022 1344   BASOSABS 0.1 10/11/2022 1344    ASSESSMENT AND PLAN: 1. Dermatitis Stop Degree Deodorant.  Use steroid cream for at least 3 days before using deodorant again.  I suggest that she try Dove for Sensitive Skin - triamcinolone cream (KENALOG) 0.1 %; Apply 1 Application topically 2 (two) times daily.  Dispense: 30 g; Refill: 0  2. Essential hypertension Elevated.  She has not taken medications as yet for today.  She will do so when she returns home. She will stop at lab today to get labs that her PCP had ordered.    Patient was given the opportunity to ask questions.  Patient verbalized understanding of the plan and was able to repeat key elements of the plan.   This documentation was completed using Paediatric nurse.  Any transcriptional errors are unintentional.  No orders of the defined types were placed in this encounter.    Requested Prescriptions   Signed Prescriptions Disp Refills   triamcinolone cream (KENALOG) 0.1 % 30 g 0    Sig: Apply 1 Application topically 2 (two) times daily.    No follow-ups on file.  Jonah Blue, MD, FACP

## 2023-02-26 LAB — POTASSIUM: Potassium: 4.5 mmol/L (ref 3.5–5.2)

## 2023-02-26 LAB — LP+NON-HDL CHOLESTEROL
Cholesterol, Total: 181 mg/dL (ref 100–199)
HDL: 52 mg/dL (ref >39)
LDL Chol Calc (NIH): 116 mg/dL — ABNORMAL HIGH (ref 0–99)
Total Non-HDL-Chol (LDL+VLDL): 129 mg/dL (ref 0–129)
Triglycerides: 70 mg/dL (ref 0–149)
VLDL Cholesterol Cal: 13 mg/dL (ref 5–40)

## 2023-02-26 LAB — URIC ACID: Uric Acid: 6.4 mg/dL (ref 3.0–7.2)

## 2023-02-27 ENCOUNTER — Ambulatory Visit (HOSPITAL_COMMUNITY): Payer: Medicaid Other | Attending: Student

## 2023-02-27 ENCOUNTER — Other Ambulatory Visit: Payer: Self-pay

## 2023-02-27 DIAGNOSIS — I5022 Chronic systolic (congestive) heart failure: Secondary | ICD-10-CM | POA: Diagnosis not present

## 2023-02-27 DIAGNOSIS — I110 Hypertensive heart disease with heart failure: Secondary | ICD-10-CM | POA: Insufficient documentation

## 2023-02-27 LAB — ECHOCARDIOGRAM COMPLETE: S' Lateral: 2.4 cm

## 2023-03-05 ENCOUNTER — Other Ambulatory Visit: Payer: Self-pay

## 2023-03-05 ENCOUNTER — Other Ambulatory Visit: Payer: Self-pay | Admitting: Cardiovascular Disease

## 2023-03-05 DIAGNOSIS — I482 Chronic atrial fibrillation, unspecified: Secondary | ICD-10-CM

## 2023-03-05 MED ORDER — RIVAROXABAN 20 MG PO TABS
20.0000 mg | ORAL_TABLET | Freq: Every day | ORAL | 0 refills | Status: DC
  Filled 2023-03-05: qty 90, 90d supply, fill #0

## 2023-03-05 NOTE — Telephone Encounter (Signed)
Prescription refill request for Xarelto received.  Indication: Last office visit: 11/05/22 Thane Edu PA-C  (Appt 03/18/23) Weight: 91.3kg Age: 57 Scr: 1.05 on 10/28/22  Epic CrCl:  84.17  Based on above findings Xarelto 20mg  daily is the appropriate dose.  Refill approved.

## 2023-03-09 ENCOUNTER — Other Ambulatory Visit: Payer: Self-pay

## 2023-03-09 NOTE — Progress Notes (Deleted)
Cardiology Office Note:    Date:  03/09/2023   ID:  [name], DOB 1965-10-07, MRN [id number]  PCP:  Elizabeth Register, MD  Cardiologist:  Elizabeth Fair, MD  Electrophysiologist:  None   Referring MD: Elizabeth Register, MD   Chief Complaint: follow-up of CHF  History of Present Illness:    [name] is a 57 y.o. 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 Royann Murphy and presents today for follow-up of CHF.    Patient is primary followed by Cardiology for CHF and atrial fibrillation. She has a long history of atrial fibrillation which is felt to be permanent at this point. She was admitted in 03/2021 with acute CHF after presenting with worsening shortness of breath, edema, and weight gain. Echo showed LVEF of 25-30% with global hypokinesis and indeterminate diastolic parameters, moderately enlarged RV with mildly reduced RV, mild AI, moderate MR, and severe TR. EF was down from 60-65% on prior Echo in 2020. She was diuresed with IV Lasix and then underwent R/LHC which showed 60% stenosis of mid LAD, 75% stenosis of distal LCX, and 70% stenosis of proximal 1st Diag as well as mild pulmonary hypertension. Medical therapy was recommended. She was started GDMT and transitioned to PO Lasix. Cardiomyopathy was felt to be tachymediated in nature.   Patient was last seen by me in 10/2022 at which time she reported lower extremity edema as well as a 10 lb weight gain over the last 2 months. However, she was otherwise doing well from a cardiac standpoint. Pro-BNP was elevated at 755 but did not rule her in for CHF based on age. Losartan was stopped and she was switched to Entresto  and Toprol-XL was increased for better rate control. Repeat Echo was ordered to reassess LV function and showed LVEF of 60-65% with normal wall motion and moderate LVH, normal RV with mildly elevated PASP of 37.5 mmHg, moderate biatrial enlargement, mild AI, and mild TR (no evidence of MR).   Patient presents today for follow-up.  ***  Chronic Combined CHF Non-Ischemic Cardiomyopathy (Presumed Tachy-Mediated) Echo in 03/2021 showed LVEF of 25-30% with global hypokinesis and indeterminate diastolic parameters, moderately enlarged RV with mildly reduced RV, mild AI, moderate MR, and severe TR. R/LHC at the time showed non-obstructive CAD that was out of proportion to degree of cardiomyopathy. GDMT was adjusted and EF normalized. Recent Echo in 02/2023 showed LVEF of 60-65% with normal wall motion and moderate LVH, normal RV with mildly elevated PASP of 37.5 mmHg, moderate biatrial enlargement, mild AI, and mild TR (no evidence of MR). - Euvolemic on exam. Weight *** - Continue Lasix 40mg  daily. - Continue Entresto 24-26mg  twice daily.  - Currently on Toprol-XL 2000mg  daily.  - Continue Spironolactone 25mg  daily. - Continue Farxiga 10mg  daily. - Continue  daily weight and sodium/ fluid restrictions.  - Will repeat BMET. ***   Chronic Atrial Fibrillation Long history of atrial fibrillation that is felt to likely be permanent at this time.  - Rates *** - Continue Toprol-XL 200mg  daily.  - Currently on Digoxin 0.25mg  daily. Given EF as normalized, will stop Digoxin and start Diltiazem *** per Dr. Erin Murphy recommendation at visit in 07/2021. *** - Continue chronic anticoagulation with Xarelto 20mg  daily.  CAD LHC in 03/2021 showed 60% stenosis of mid LAD, 75% stenosis of distal LCX, and 70% stenosis of proximal 1st Diag. Medical therapy was recommended at that time. - No chest pain.  - No aspirin due to need for DOAC. - Continue beta-blocker and statin.   Aortic Insufficiency Mitral  Regurgitation Echo in 03/2021 showed reduced EF of 25-30% with mild AI and moderate AI. Most recent Echo in 02/2023 showed normalization of EF with persistent mild AI but no evidence of MR (suggestive of functional MR in setting of reduced EF).  - Can continue to monitor with routine serial Echos.    Hypertension BP *** - Continue medications for CHF as above.    Dyslipidemia Most recent lipid panel in 02/2023: Total Cholesterol 181, Triglycerides 70, HDL 52, LDL 116.  LDL goal <70 given CAD.  - Continue Lipitor 80mg  daily. ***compliance ***   Type 2 Diabetes Mellitus Hemoglobin A1 6.3 in 10/2022. - On Farxiga as above. - Management per PCP.  EKGs/Labs/Other Studies Reviewed:    The following studies were reviewed:  Echocardiogram 05/09/2019: Impressions: 1. The left ventricle has normal systolic function with an ejection  fraction of 60-65%. The cavity size was normal. There is mildly increased  left ventricular wall thickness. not assessed due to atrial flutter. No  evidence of left ventricular regional  wall motion abnormalities.   2. Left atrial size was moderately dilated.   3. The mitral valve is abnormal. Mild thickening of the mitral valve  leaflet.   4. The tricuspid valve is grossly normal.   5. The aortic valve is tricuspid. Mild sclerosis of the aortic valve.  Aortic valve regurgitation is mild by color flow Doppler. No stenosis of  the aortic valve.   6. The aorta is normal unless otherwise noted.   7. The inferior vena cava was dilated in size with >50% respiratory  variability.   8. When compared to the prior study: 02/09/2018: LVEF 60-65%, mild AI,  possible LA thrombus.    _______________   Echocardiogram 03/28/2021: Impressions:  1. Left ventricular ejection fraction, by estimation, is 25 to 30%. The  left ventricle has severely decreased function. The left ventricle  demonstrates global hypokinesis. There is mild left ventricular  hypertrophy. Left  ventricular diastolic parameters   are indeterminate.   2. Right ventricular systolic function is mildly reduced. The right  ventricular size is moderately enlarged. There is normal pulmonary artery  systolic pressure. The estimated right ventricular systolic pressure is  17.4 mmHg.   3. Left atrial size was mildly dilated.   4. Right atrial size was moderately dilated.   5. The mitral valve is normal in structure. Moderate mitral valve  regurgitation. No evidence of mitral stenosis.   6. Tricuspid valve regurgitation is severe. Suspect functional from  annular dilatation.   7. The aortic valve is tricuspid. Aortic valve regurgitation is mild. No  aortic stenosis is present.   8. The inferior vena cava is dilated in size with >50% respiratory  variability, suggesting right atrial pressure of 8 mmHg.   9. The patient was in atrial fibrillation.  10. Significant change in LV and RV function compared to prior echo.  _______________   Right/Left Cardiac Catheterization 04/02/2021: 60% mid LAD after second diagonal. 75% distal circumflex before small third obtuse marginal. Right dominant anatomy Mild pulmonary hypertension with mean pressure 31 mmHg, mean capillary wedge pressure 16 mmHg, pulmonary vascular resistance 2.56 Wood units. Pulmonary artery O2 saturation 73%, cardiac output and index  are 5.87 L/min and 3.01 L/min/m respectively.   Recommendations: Guideline directed therapy for systolic dysfunction Rate control Further management per treating team.   Diagnostic Dominance: Right     _______________  Echocardiogram 02/27/2023: Impressions: 1. Left ventricular ejection fraction, by estimation, is 60 to 65%. The  left ventricle has normal function. The left ventricle has no regional  wall motion abnormalities. There is moderate concentric left ventricular  hypertrophy. Left ventricular  diastolic function could not be evaluated.   2. Right ventricular systolic function  is normal. The right ventricular  size is normal. There is mildly elevated pulmonary artery systolic  pressure. The estimated right ventricular systolic pressure is 37.5 mmHg.   3. Left atrial size was moderately dilated.   4. Right atrial size was moderately dilated.   5. The mitral valve is normal in structure. No evidence of mitral valve  regurgitation. No evidence of mitral stenosis.   6. The aortic valve is tricuspid. There is moderate calcification of the  aortic valve. Aortic valve regurgitation is mild. No aortic stenosis is  present.   7. The inferior vena cava is normal in size with greater than 50%  respiratory variability, suggesting right atrial pressure of 3 mmHg.   Conclusion(s)/Recommendation(s): LV and RV function have normalized since  last echo.    EKG:  EKG not ordered today.   Recent Labs: 10/11/2022: ALT 23; Hemoglobin 13.2; Platelets 185 10/28/2022: BUN 15; Creatinine, Ser 1.05; NT-Pro BNP 755; Sodium 143 02/25/2023: Potassium 4.5  Recent Lipid Panel    Component Value Date/Time   CHOL 181 02/25/2023 1007   TRIG 70 02/25/2023 1007   HDL 52 02/25/2023 1007   CHOLHDL 6.2 03/29/2021 0036   VLDL 14 03/29/2021 0036   LDLCALC 116 (H) 02/25/2023 1007    Physical Exam:    Vital Signs: There were no vitals taken for this visit.    Wt Readings from Last 3 Encounters:  02/25/23 201 lb (91.2 kg)  10/29/22 199 lb 3.2 oz (90.4 kg)  10/28/22 201 lb 3.2 oz (91.3 kg)     General: 57 y.o. female in no acute distress. HEENT: Normocephalic and atraumatic. Sclera clear. EOMs intact. Neck: Supple. No carotid bruits. No JVD. Heart: *** RRR. Distinct S1 and S2. No murmurs, gallops, or rubs. Radial and distal pedal pulses 2+ and equal bilaterally. Lungs: No increased work of breathing. Clear to ausculation bilaterally. No wheezes, rhonchi, or rales.  Abdomen: Soft, non-distended, and non-tender to palpation. Bowel sounds present in all 4 quadrants.  MSK: Normal strength  and tone for age. *** Extremities: No lower extremity edema.    Skin: Warm and dry. Neuro: Alert and oriented x3. No focal deficits. Psych: Normal affect. Responds appropriately.   Assessment:    No diagnosis found.  Plan:     Disposition: Follow up in ***   Medication Adjustments/Labs and Tests Ordered: Current medicines are reviewed at length with the patient today.  Concerns regarding medicines are outlined above.  No orders of the defined types were placed in this encounter.  No orders of the defined types were placed in this encounter.   There are no Patient Instructions on file for this visit.   Signed, Corrin Parker, PA-C  03/09/2023 7:09 PM    [facility]

## 2023-03-10 ENCOUNTER — Other Ambulatory Visit: Payer: Self-pay

## 2023-03-18 ENCOUNTER — Ambulatory Visit: Payer: Medicaid Other | Admitting: Student

## 2023-03-18 DIAGNOSIS — I4891 Unspecified atrial fibrillation: Secondary | ICD-10-CM

## 2023-05-02 NOTE — Progress Notes (Unsigned)
Cardiology Office Note:    Date:  05/02/2023   ID:  [name], DOB 10/23/1965, MRN [id number]  PCP:  Hoy Register, MD  Cardiologist:  [name], MD  Electrophysiologist:  None   Referring MD: Hoy Register, MD   Chief Complaint: follow-up of CHF  History of Present Illness:    [name] is a 57 y.o. 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 [name] and presents today for follow-up of CHF.    Patient is primary followed by Cardiology for CHF and atrial fibrillation. She has a long history of atrial fibrillation which is felt to be permanent at this point. She was admitted in 03/2021 with acute CHF after presenting with worsening shortness of breath, edema, and weight gain. Echo showed LVEF of 25-30% with global hypokinesis and indeterminate diastolic parameters, moderately enlarged RV with mildly reduced RV, mild AI, moderate MR, and severe TR. EF was down from 60-65% on prior Echo in 2020. She was diuresed with IV Lasix and then underwent R/LHC which showed 60% stenosis of mid LAD, 75% stenosis of distal LCX, and 70% stenosis of proximal 1st Diag as well as mild pulmonary hypertension. Medical therapy was recommended. She was started GDMT and transitioned to PO Lasix. Cardiomyopathy was felt to be tachymediated in nature.   Patient was last seen by me in 10/2022 at which time she reported lower extremity edema as well as a 10 lb weight gain over the last 2 months. However, she was otherwise doing well from a cardiac standpoint. Pro-BNP was elevated at 755 but did not rule her in for CHF based on age. Losartan was stopped and she was switched to Entresto  and Toprol-XL was increased for better rate control. Repeat Echo was ordered to reassess LV function and showed LVEF of 60-65% with normal wall motion and moderate LVH, normal RV with mildly elevated PASP of 37.5 mmHg, moderate biatrial enlargement, mild AI, and mild TR (no evidence of MR).   Patient presents today for follow-up.  ***  Chronic Combined CHF Non-Ischemic Cardiomyopathy (Presumed Tachy-Mediated) Echo in 03/2021 showed LVEF of 25-30% with global hypokinesis and indeterminate diastolic parameters, moderately enlarged RV with mildly reduced RV, mild AI, moderate MR, and severe TR. R/LHC at the time showed non-obstructive CAD that was out of proportion to degree of cardiomyopathy. GDMT was adjusted and EF normalized. Recent Echo in 02/2023 showed LVEF of 60-65% with normal wall motion and moderate LVH, normal RV with mildly elevated PASP of 37.5 mmHg, moderate biatrial enlargement, mild AI, and mild TR (no evidence of MR). - Euvolemic on exam. Weight *** - Continue Lasix 40mg  daily. - Continue Entresto 24-26mg  twice daily.  - Currently on Toprol-XL 2000mg  daily.  - Continue Spironolactone 25mg  daily. - Continue Farxiga 10mg  daily. - Continue  daily weight and sodium/ fluid restrictions.  - Will repeat BMET. ***   Chronic Atrial Fibrillation Long history of atrial fibrillation that is felt to likely be permanent at this time.  - Rates *** - Continue Toprol-XL 200mg  daily.  - Currently on Digoxin 0.25mg  daily. Given EF as normalized, will stop Digoxin and start Diltiazem *** per Dr. Erin Hearing recommendation at visit in 07/2021. *** - Continue chronic anticoagulation with Xarelto 20mg  daily.  CAD LHC in 03/2021 showed 60% stenosis of mid LAD, 75% stenosis of distal LCX, and 70% stenosis of proximal 1st Diag. Medical therapy was recommended at that time. - No chest pain.  - No aspirin due to need for DOAC. - Continue beta-blocker and statin.   Aortic Insufficiency Mitral  Regurgitation Echo in 03/2021 showed reduced EF of 25-30% with mild AI and moderate AI. Most recent Echo in 02/2023 showed normalization of EF with persistent mild AI but no evidence of MR (suggestive of functional MR in setting of reduced EF).  - Can continue to monitor with routine serial Echos.    Hypertension BP *** - Continue medications for CHF as above.    Dyslipidemia Most recent lipid panel in 02/2023: Total Cholesterol 181, Triglycerides 70, HDL 52, LDL 116.  LDL goal <70 given CAD.  - Continue Lipitor 80mg  daily. ***compliance ***   Type 2 Diabetes Mellitus Hemoglobin A1 6.3 in 10/2022. - On Farxiga as above. - Management per PCP.   EKGs/Labs/Other Studies Reviewed:    The following studies were reviewed today:  Echocardiogram 05/09/2019: Impressions: 1. The left ventricle has normal systolic function with an ejection  fraction of 60-65%. The cavity size was normal. There is mildly increased  left ventricular wall thickness. not assessed due to atrial flutter. No  evidence of left ventricular regional  wall motion abnormalities.   2. Left atrial size was moderately dilated.   3. The mitral valve is abnormal. Mild thickening of the mitral valve  leaflet.   4. The tricuspid valve is grossly normal.   5. The aortic valve is tricuspid. Mild sclerosis of the aortic valve.  Aortic valve regurgitation is mild by color flow Doppler. No stenosis of  the aortic valve.   6. The aorta is normal unless otherwise noted.   7. The inferior vena cava was dilated in size with >50% respiratory  variability.   8. When compared to the prior study: 02/09/2018: LVEF 60-65%, mild AI,  possible LA thrombus.    _______________   Echocardiogram 03/28/2021: Impressions:  1. Left ventricular ejection fraction, by estimation, is 25 to 30%. The  left ventricle has severely decreased function. The left ventricle  demonstrates global hypokinesis. There is mild left ventricular  hypertrophy. Left  ventricular diastolic parameters   are indeterminate.   2. Right ventricular systolic function is mildly reduced. The right  ventricular size is moderately enlarged. There is normal pulmonary artery  systolic pressure. The estimated right ventricular systolic pressure is  17.4 mmHg.   3. Left atrial size was mildly dilated.   4. Right atrial size was moderately dilated.   5. The mitral valve is normal in structure. Moderate mitral valve  regurgitation. No evidence of mitral stenosis.   6. Tricuspid valve regurgitation is severe. Suspect functional from  annular dilatation.   7. The aortic valve is tricuspid. Aortic valve regurgitation is mild. No  aortic stenosis is present.   8. The inferior vena cava is dilated in size with >50% respiratory  variability, suggesting right atrial pressure of 8 mmHg.   9. The patient was in atrial fibrillation.  10. Significant change in LV and RV function compared to prior echo.  _______________   Right/Left Cardiac Catheterization 04/02/2021: 60% mid LAD after second diagonal. 75% distal circumflex before small third obtuse marginal. Right dominant anatomy Mild pulmonary hypertension with mean pressure 31 mmHg, mean capillary wedge pressure 16 mmHg, pulmonary vascular resistance 2.56 Wood units. Pulmonary artery O2 saturation 73%, cardiac output  and index are 5.87 L/min and 3.01 L/min/m respectively.   Recommendations: Guideline directed therapy for systolic dysfunction Rate control Further management per treating team.   Diagnostic Dominance: Right     _______________   Echocardiogram 02/27/2023: Impressions: 1. Left ventricular ejection fraction, by estimation, is 60 to 65%. The  left ventricle has normal function. The left ventricle has no regional  wall motion abnormalities. There is moderate concentric left ventricular  hypertrophy. Left ventricular  diastolic function could not be evaluated.   2. Right ventricular systolic function  is normal. The right ventricular  size is normal. There is mildly elevated pulmonary artery systolic  pressure. The estimated right ventricular systolic pressure is 37.5 mmHg.   3. Left atrial size was moderately dilated.   4. Right atrial size was moderately dilated.   5. The mitral valve is normal in structure. No evidence of mitral valve  regurgitation. No evidence of mitral stenosis.   6. The aortic valve is tricuspid. There is moderate calcification of the  aortic valve. Aortic valve regurgitation is mild. No aortic stenosis is  present.   7. The inferior vena cava is normal in size with greater than 50%  respiratory variability, suggesting right atrial pressure of 3 mmHg.   Conclusion(s)/Recommendation(s): LV and RV function have normalized since  last echo.    EKG:  EKG not ordered today.   Recent Labs: 10/11/2022: ALT 23; Hemoglobin 13.2; Platelets 185 10/28/2022: BUN 15; Creatinine, Ser 1.05; NT-Pro BNP 755; Sodium 143 02/25/2023: Potassium 4.5  Recent Lipid Panel    Component Value Date/Time   CHOL 181 02/25/2023 1007   TRIG 70 02/25/2023 1007   HDL 52 02/25/2023 1007   CHOLHDL 6.2 03/29/2021 0036   VLDL 14 03/29/2021 0036   LDLCALC 116 (H) 02/25/2023 1007    Physical Exam:    Vital Signs: There were no vitals taken for this visit.    Wt Readings from Last 3 Encounters:  02/25/23 201 lb (91.2 kg)  10/29/22 199 lb 3.2 oz (90.4 kg)  10/28/22 201 lb 3.2 oz (91.3 kg)     General: 57 y.o. female in no acute distress. HEENT: Normocephalic and atraumatic. Sclera clear. EOMs intact. Neck: Supple. No carotid bruits. No JVD. Heart: *** RRR. Distinct S1 and S2. No murmurs, gallops, or rubs. Radial and distal pedal pulses 2+ and equal bilaterally. Lungs: No increased work of breathing. Clear to ausculation bilaterally. No wheezes, rhonchi, or rales.  Abdomen: Soft, non-distended, and non-tender to palpation. Bowel sounds present in all 4 quadrants.  MSK: Normal strength  and tone for age. *** Extremities: No lower extremity edema.    Skin: Warm and dry. Neuro: Alert and oriented x3. No focal deficits. Psych: Normal affect. Responds appropriately.   Assessment:    No diagnosis found.  Plan:     Disposition: Follow up in ***   Medication Adjustments/Labs and Tests Ordered: Current medicines are reviewed at length with the patient today.  Concerns regarding medicines are outlined above.  No orders of the defined types were placed in this encounter.  No orders of the defined types were placed in this encounter.   There are no Patient Instructions on file for this visit.   Signed, Corrin Parker, PA-C  05/02/2023 10:14 PM    [facility]

## 2023-05-04 ENCOUNTER — Ambulatory Visit: Payer: Medicaid Other | Admitting: Student

## 2023-05-15 ENCOUNTER — Encounter: Payer: Self-pay | Admitting: Pharmacist

## 2023-05-15 ENCOUNTER — Telehealth: Payer: Self-pay | Admitting: Pharmacist

## 2023-05-15 NOTE — Telephone Encounter (Signed)
 Called patient to schedule an appointment for a BP check. I was unable to reach the patient so I left a HIPAA-compliant message requesting that the patient return my call.   Butch Penny, PharmD, P[place]y [place], CPP Clinical Pharmacist [facility] [phone]

## 2023-05-27 ENCOUNTER — Telehealth: Payer: Self-pay | Admitting: *Deleted

## 2023-05-27 NOTE — Telephone Encounter (Signed)
Called patient to schedule for OV to return for BP check. Please schedule if he calls back for HTN f/u As a walkin or Saturday 06/06/2023 if possible.   If not first available.

## 2023-06-05 NOTE — Progress Notes (Deleted)
Cardiology Office Note:    Date:  06/05/2023   ID:  [name], DOB Jan 25, 1966, MRN [id number]  PCP:  Hoy Register, MD  Cardiologist:  [name], MD  Electrophysiologist:  None   Referring MD: Hoy Register, MD   Chief Complaint: follow-up of CHF  History of Present Illness:    [name] is a 57 y.o. 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 [name] and presents today for follow-up of CHF.    Patient is primary followed by Cardiology for CHF and atrial fibrillation. She has a long history of atrial fibrillation which is felt to be permanent at this point. She was admitted in 03/2021 with acute CHF after presenting with worsening shortness of breath, edema, and weight gain. Echo showed LVEF of 25-30% with global hypokinesis and indeterminate diastolic parameters, moderately enlarged RV with mildly reduced RV, mild AI, moderate MR, and severe TR. EF was down from 60-65% on prior Echo in 2020. She was diuresed with IV Lasix and then underwent R/LHC which showed 60% stenosis of mid LAD, 75% stenosis of distal LCX, and 70% stenosis of proximal 1st Diag as well as mild pulmonary hypertension. Medical therapy was recommended. She was started GDMT and transitioned to PO Lasix. Cardiomyopathy was felt to be tachymediated in nature.   Patient was last seen by me in 10/2022 at which time she reported lower extremity edema as well as a 10 lb weight gain over the last 2 months. However, she was otherwise doing well from a cardiac standpoint. Pro-BNP was elevated at 755 but did not rule her in for CHF based on age. Losartan was stopped and she was switched to Entresto  and Toprol-XL was increased for better rate control. Repeat Echo was ordered to reassess LV function and showed LVEF of 60-65% with normal wall motion and moderate LVH, normal RV with mildly elevated PASP of 37.5 mmHg, moderate biatrial enlargement, mild AI, and mild TR (no evidence of MR).   Patient presents today for follow-up.  ***  Chronic Combined CHF Non-Ischemic Cardiomyopathy (Presumed Tachy-Mediated) Echo in 03/2021 showed LVEF of 25-30% with global hypokinesis and indeterminate diastolic parameters, moderately enlarged RV with mildly reduced RV, mild AI, moderate MR, and severe TR. R/LHC at the time showed non-obstructive CAD that was out of proportion to degree of cardiomyopathy. GDMT was adjusted and EF normalized. Recent Echo in 02/2023 showed LVEF of 60-65% with normal wall motion and moderate LVH, normal RV with mildly elevated PASP of 37.5 mmHg, moderate biatrial enlargement, mild AI, and mild TR (no evidence of MR). - Euvolemic on exam. Weight *** - Continue Lasix 40mg  daily. - Continue Entresto 24-26mg  twice daily.  - Currently on Toprol-XL 2000mg  daily.  - Continue Spironolactone 25mg  daily. - Continue Farxiga 10mg  daily. - Continue  daily weight and sodium/ fluid restrictions.  - Will repeat BMET. ***   Chronic Atrial Fibrillation Long history of atrial fibrillation that is felt to likely be permanent at this time.  - Rates *** - Continue Toprol-XL 200mg  daily.  - Currently on Digoxin 0.25mg  daily. Given EF as normalized, will stop Digoxin and start Diltiazem *** per Dr. Erin Hearing recommendation at visit in 07/2021. *** - Continue chronic anticoagulation with Xarelto 20mg  daily.  CAD LHC in 03/2021 showed 60% stenosis of mid LAD, 75% stenosis of distal LCX, and 70% stenosis of proximal 1st Diag. Medical therapy was recommended at that time. - No chest pain.  - No aspirin due to need for DOAC. - Continue beta-blocker and statin.   Aortic Insufficiency Mitral  Regurgitation Echo in 03/2021 showed reduced EF of 25-30% with mild AI and moderate AI. Most recent Echo in 02/2023 showed normalization of EF with persistent mild AI but no evidence of MR (suggestive of functional MR in setting of reduced EF).  - Can continue to monitor with routine serial Echos.    Hypertension BP *** - Continue medications for CHF as above.    Dyslipidemia Most recent lipid panel in 02/2023: Total Cholesterol 181, Triglycerides 70, HDL 52, LDL 116.  LDL goal <70 given CAD.  - Continue Lipitor 80mg  daily. ***compliance ***   Type 2 Diabetes Mellitus Hemoglobin A1 6.3 in 10/2022. - On Farxiga as above. - Management per PCP.  EKGs/Labs/Other Studies Reviewed:    The following studies were reviewed:  Echocardiogram 05/09/2019: Impressions: 1. The left ventricle has normal systolic function with an ejection  fraction of 60-65%. The cavity size was normal. There is mildly increased  left ventricular wall thickness. not assessed due to atrial flutter. No  evidence of left ventricular regional  wall motion abnormalities.   2. Left atrial size was moderately dilated.   3. The mitral valve is abnormal. Mild thickening of the mitral valve  leaflet.   4. The tricuspid valve is grossly normal.   5. The aortic valve is tricuspid. Mild sclerosis of the aortic valve.  Aortic valve regurgitation is mild by color flow Doppler. No stenosis of  the aortic valve.   6. The aorta is normal unless otherwise noted.   7. The inferior vena cava was dilated in size with >50% respiratory  variability.   8. When compared to the prior study: 02/09/2018: LVEF 60-65%, mild AI,  possible LA thrombus.    _______________   Echocardiogram 03/28/2021: Impressions:  1. Left ventricular ejection fraction, by estimation, is 25 to 30%. The  left ventricle has severely decreased function. The left ventricle  demonstrates global hypokinesis. There is mild left ventricular  hypertrophy. Left  ventricular diastolic parameters   are indeterminate.   2. Right ventricular systolic function is mildly reduced. The right  ventricular size is moderately enlarged. There is normal pulmonary artery  systolic pressure. The estimated right ventricular systolic pressure is  17.4 mmHg.   3. Left atrial size was mildly dilated.   4. Right atrial size was moderately dilated.   5. The mitral valve is normal in structure. Moderate mitral valve  regurgitation. No evidence of mitral stenosis.   6. Tricuspid valve regurgitation is severe. Suspect functional from  annular dilatation.   7. The aortic valve is tricuspid. Aortic valve regurgitation is mild. No  aortic stenosis is present.   8. The inferior vena cava is dilated in size with >50% respiratory  variability, suggesting right atrial pressure of 8 mmHg.   9. The patient was in atrial fibrillation.  10. Significant change in LV and RV function compared to prior echo.  _______________   Right/Left Cardiac Catheterization 04/02/2021: 60% mid LAD after second diagonal. 75% distal circumflex before small third obtuse marginal. Right dominant anatomy Mild pulmonary hypertension with mean pressure 31 mmHg, mean capillary wedge pressure 16 mmHg, pulmonary vascular resistance 2.56 Wood units. Pulmonary artery O2 saturation 73%, cardiac output and index  are 5.87 L/min and 3.01 L/min/m respectively.   Recommendations: Guideline directed therapy for systolic dysfunction Rate control Further management per treating team.   Diagnostic Dominance: Right     _______________   Echocardiogram 02/27/2023: Impressions: 1. Left ventricular ejection fraction, by estimation, is 60 to 65%. The  left ventricle has normal function. The left ventricle has no regional  wall motion abnormalities. There is moderate concentric left ventricular  hypertrophy. Left ventricular  diastolic function could not be evaluated.   2. Right ventricular systolic function  is normal. The right ventricular  size is normal. There is mildly elevated pulmonary artery systolic  pressure. The estimated right ventricular systolic pressure is 37.5 mmHg.   3. Left atrial size was moderately dilated.   4. Right atrial size was moderately dilated.   5. The mitral valve is normal in structure. No evidence of mitral valve  regurgitation. No evidence of mitral stenosis.   6. The aortic valve is tricuspid. There is moderate calcification of the  aortic valve. Aortic valve regurgitation is mild. No aortic stenosis is  present.   7. The inferior vena cava is normal in size with greater than 50%  respiratory variability, suggesting right atrial pressure of 3 mmHg.   Conclusion(s)/Recommendation(s): LV and RV function have normalized since  last echo.  EKG:  EKG not ordered today.   Recent Labs: 10/11/2022: ALT 23; Hemoglobin 13.2; Platelets 185 10/28/2022: BUN 15; Creatinine, Ser 1.05; NT-Pro BNP 755; Sodium 143 02/25/2023: Potassium 4.5  Recent Lipid Panel    Component Value Date/Time   CHOL 181 02/25/2023 1007   TRIG 70 02/25/2023 1007   HDL 52 02/25/2023 1007   CHOLHDL 6.2 03/29/2021 0036   VLDL 14 03/29/2021 0036   LDLCALC 116 (H) 02/25/2023 1007    Physical Exam:    Vital Signs: There were no vitals taken for this visit.    Wt Readings from Last 3 Encounters:  02/25/23 201 lb (91.2 kg)  10/29/22 199 lb 3.2 oz (90.4 kg)  10/28/22 201 lb 3.2 oz (91.3 kg)     General: 57 y.o. female in no acute distress. HEENT: Normocephalic and atraumatic. Sclera clear. EOMs intact. Neck: Supple. No carotid bruits. No JVD. Heart: *** RRR. Distinct S1 and S2. No murmurs, gallops, or rubs. Radial and distal pedal pulses 2+ and equal bilaterally. Lungs: No increased work of breathing. Clear to ausculation bilaterally. No wheezes, rhonchi, or rales.  Abdomen: Soft, non-distended, and non-tender to palpation. Bowel sounds present in all 4 quadrants.  MSK: Normal strength and  tone for age. *** Extremities: No lower extremity edema.    Skin: Warm and dry. Neuro: Alert and oriented x3. No focal deficits. Psych: Normal affect. Responds appropriately.   Assessment:    No diagnosis found.  Plan:     Disposition: Follow up in ***   Medication Adjustments/Labs and Tests Ordered: Current medicines are reviewed at length with the patient today.  Concerns regarding medicines are outlined above.  No orders of the defined types were placed in this encounter.  No orders of the defined types were placed in this encounter.   There are no Patient Instructions on file for this visit.   Signed, Corrin Parker, PA-C  06/05/2023 8:00 AM    [facility]

## 2023-06-16 ENCOUNTER — Ambulatory Visit: Payer: Medicaid Other | Attending: Student | Admitting: Student

## 2023-06-17 ENCOUNTER — Encounter: Payer: Self-pay | Admitting: Student

## 2023-10-13 ENCOUNTER — Other Ambulatory Visit: Payer: Self-pay | Admitting: Family Medicine

## 2023-10-13 DIAGNOSIS — Z1231 Encounter for screening mammogram for malignant neoplasm of breast: Secondary | ICD-10-CM

## 2023-11-02 ENCOUNTER — Ambulatory Visit
Admission: RE | Admit: 2023-11-02 | Discharge: 2023-11-02 | Disposition: A | Discharge status: Home / Self Care | Payer: Medicaid Other | Source: Ambulatory Visit | Attending: Family Medicine | Admitting: Family Medicine

## 2023-11-02 DIAGNOSIS — Z1231 Encounter for screening mammogram for malignant neoplasm of breast: Secondary | ICD-10-CM | POA: Diagnosis not present

## 2023-11-06 ENCOUNTER — Encounter: Payer: Self-pay | Admitting: Family Medicine

## 2023-11-18 ENCOUNTER — Ambulatory Visit: Payer: Medicaid Other | Attending: Family Medicine | Admitting: Family Medicine

## 2023-11-18 ENCOUNTER — Other Ambulatory Visit: Payer: Self-pay

## 2023-11-18 VITALS — BP 164/103 | HR 102 | Ht 64.0 in | Wt 198.2 lb

## 2023-11-18 DIAGNOSIS — M79671 Pain in right foot: Secondary | ICD-10-CM | POA: Diagnosis not present

## 2023-11-18 DIAGNOSIS — I868 Varicose veins of other specified sites: Secondary | ICD-10-CM

## 2023-11-18 DIAGNOSIS — Z114 Encounter for screening for human immunodeficiency virus [HIV]: Secondary | ICD-10-CM | POA: Diagnosis not present

## 2023-11-18 DIAGNOSIS — G4489 Other headache syndrome: Secondary | ICD-10-CM | POA: Diagnosis not present

## 2023-11-18 DIAGNOSIS — I482 Chronic atrial fibrillation, unspecified: Secondary | ICD-10-CM

## 2023-11-18 DIAGNOSIS — Z7984 Long term (current) use of oral hypoglycemic drugs: Secondary | ICD-10-CM

## 2023-11-18 DIAGNOSIS — E1169 Type 2 diabetes mellitus with other specified complication: Secondary | ICD-10-CM | POA: Diagnosis not present

## 2023-11-18 DIAGNOSIS — I119 Hypertensive heart disease without heart failure: Secondary | ICD-10-CM | POA: Diagnosis not present

## 2023-11-18 DIAGNOSIS — Z23 Encounter for immunization: Secondary | ICD-10-CM | POA: Diagnosis not present

## 2023-11-18 DIAGNOSIS — E119 Type 2 diabetes mellitus without complications: Secondary | ICD-10-CM

## 2023-11-18 DIAGNOSIS — M79672 Pain in left foot: Secondary | ICD-10-CM

## 2023-11-18 DIAGNOSIS — I152 Hypertension secondary to endocrine disorders: Secondary | ICD-10-CM

## 2023-11-18 LAB — POCT GLYCOSYLATED HEMOGLOBIN (HGB A1C): HbA1c, POC (controlled diabetic range): 6 % (ref 0.0–7.0)

## 2023-11-18 MED ORDER — RIVAROXABAN 20 MG PO TABS
20.0000 mg | ORAL_TABLET | Freq: Every day | ORAL | 1 refills | Status: DC
  Filled 2023-11-18: qty 90, 90d supply, fill #0

## 2023-11-18 MED ORDER — DAPAGLIFLOZIN PROPANEDIOL 10 MG PO TABS
10.0000 mg | ORAL_TABLET | Freq: Every day | ORAL | 1 refills | Status: DC
  Filled 2023-11-18: qty 90, 90d supply, fill #0

## 2023-11-18 MED ORDER — ATORVASTATIN CALCIUM 80 MG PO TABS
80.0000 mg | ORAL_TABLET | Freq: Every day | ORAL | 1 refills | Status: DC
  Filled 2023-11-18: qty 90, 90d supply, fill #0

## 2023-11-18 MED ORDER — SPIRONOLACTONE 25 MG PO TABS
25.0000 mg | ORAL_TABLET | Freq: Every day | ORAL | 1 refills | Status: DC
  Filled 2023-11-18: qty 90, 90d supply, fill #0

## 2023-11-18 NOTE — Progress Notes (Signed)
 "  Subjective:  Patient ID: [name], female    DOB: 18-Sep-1965  Age: 58 y.o. MRN: [id number]  CC: Medical Management of Chronic Issues (Headaches/Pain in toes/Right arm pain)     Discussed the use of AI scribe software for clinical note transcription with the patient, who gave verbal consent to proceed.  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   The chief complaint is of headaches, which are sporadic and located in different areas of the head. The patient describes the pain as not throbbing and manageable without medication, although occasionally takes Tylenol . The patient has a history of vascular headaches following a previous stroke and is unsure if the current headaches are related.  Med list reveals she is on Topamax  but she has not been taking this and does not wish to restart it.  The patient also reports pain in the second toes of both feet. 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 The patient is considering investing in compression socks for relief.  Additionally, the patient noticed swelling and greenish discoloration in the veins of the right arm, which was sore but not painful. The patient attributes this to heavy lifting at work.   The patient admits to not taking all prescribed medications regularly, including spironolactone  and Farxiga , due to the burden of taking multiple pills daily. The patient's blood pressure is  high at the time of the visit, and the patient acknowledges the need to manage this better.    Past Medical History:  Diagnosis Date   Clotting disorder (HCC)    on xarelto    Essential hypertension    Heart murmur    a. 10/2015 Echo: EF 60-65%, no rwma, mild AI/MR, sev dil LA/RA, PASP .   Noncompliance    NSVT (nonsustained ventricular tachycardia) (HCC)    a. 10/2015 during admission for CVA/AF.   Persistent atrial fibrillation (HCC)    a. CHA2DS2VASC = 4-->Xarelto ;  b. 02/2016 Successful DCCV.  c. Recurrent fib   Pneumonia 2012 x 2; 2015   Stroke [facility])    a. 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   Transient ischemic attack (TIA) 01/2013    Past Surgical History:  Procedure Laterality Date   CARDIOVERSION N/A 02/18/2013   Procedure: CARDIOVERSION;  Surgeon: Jerel Balding, MD;  Location: [facility] ENDOSCOPY;  Service: Cardiovascular;  Laterality: N/A;   CARDIOVERSION N/A 02/23/2013   Procedure: CARDIOVERSION;  Surgeon: Alm LELON Clay, MD;  Location: [facility] OR;  Service: Cardiovascular;  Laterality: N/A;  BESIDE CV   CARDIOVERSION N/A 03/12/2016   Procedure: CARDIOVERSION;  Surgeon: Ezra GORMAN Shuck, MD;  Location: [facility] ENDOSCOPY;  Service: Cardiovascular;  Laterality: N/A;   RIGHT/LEFT HEART CATH AND CORONARY ANGIOGRAPHY N/A 04/02/2021   Procedure: RIGHT/LEFT HEART CATH AND CORONARY ANGIOGRAPHY;  Surgeon: Claudene Victory LELON, MD;  Location: [facility] INVASIVE CV LAB;  Service: Cardiovascular;  Laterality: N/A;   TEE  WITHOUT CARDIOVERSION N/A 02/18/2013   Procedure: TRANSESOPHAGEAL ECHOCARDIOGRAM (TEE);  Surgeon: Jerel Balding, MD;  Location: [facility] ENDOSCOPY;  Service: Cardiovascular;  Laterality: N/A;   TUBAL LIGATION  1992    Family History  Problem Relation Age of Onset   Cancer Mother    Atrial fibrillation Mother    Breast cancer Mother    Hypertension Father    Atrial fibrillation Father    Peripheral vascular  disease Father    Hypertension Other    Diabetes Other    Heart attack Neg Hx    Stroke Neg Hx    Rectal cancer Neg Hx    Colon cancer Neg Hx    Esophageal cancer Neg Hx    Stomach cancer Neg Hx     Social History   Socioeconomic History   Marital status: Single    Spouse name: Not on file   Number of children: Not on file   Years of education: Not on file   Highest education level: Associate degree: occupational, scientist, product/process development, or vocational program  Occupational History   Occupation: Multimedia Programmer: [employer]   Occupation: in home health aide  Tobacco Use   Smoking status: Former    Current packs/day: 0.00    Types: Cigarettes    Start date: 03/15/2010    Quit date: 03/15/2013    Years since quitting: 10.6   Smokeless tobacco: Never  Vaping Use   Vaping status: Never Used  Substance and Sexual Activity   Alcohol use: Yes    Comment: drinks on the weekends,   2-3 beers   Drug use: No   Sexual activity: Not Currently    Birth control/protection: None  Other Topics Concern   Not on file  Social History Narrative   Not on file   Social Drivers of Health   Financial Resource Strain: Low Risk  (11/18/2023)   Overall Financial Resource Strain (CARDIA)    Difficulty of Paying Living Expenses: Not hard at all  Food Insecurity: No Food Insecurity (11/18/2023)   Hunger Vital Sign    Worried About Running Out of Food in the Last Year: Never true    Ran Out of Food in the Last Year: Never true  Transportation Needs: Unmet Transportation Needs (11/18/2023)   PRAPARE - Transportation    Lack of Transportation (Medical): Yes    Lack of Transportation (Non-Medical): Yes  Physical Activity: Unknown (11/18/2023)   Exercise Vital Sign    Days of Exercise per Week: 5 days    Minutes of Exercise per Session: Patient declined  Stress: Stress Concern Present (11/18/2023)   [employer] of Occupational Health - Occupational Stress Questionnaire    Feeling of Stress : Rather  much  Social Connections: Unknown (11/18/2023)   Social Connection and Isolation Panel [NHANES]    Frequency of Communication with Friends and Family: Patient declined    Frequency of Social Gatherings with Friends and Family: Patient declined    Attends Religious Services: Patient declined    Database Administrator or Organizations: No    Attends Engineer, Structural: Not on file    Marital Status: Never married    Allergies  Allergen Reactions   Penicillins Hives and Other (See Comments)    Unknown  Has patient had a PCN reaction causing immediate rash, facial/tongue/throat swelling, SOB or lightheadedness with hypotension: No Has patient had a PCN reaction causing severe rash involving mucus membranes or skin necrosis: No Has patient had a PCN  reaction that required hospitalization No Has patient had a PCN reaction occurring within the last 10 years: No If all of the above answers are NO, then may proceed with Cephalosporin use.    Outpatient Medications Prior to Visit  Medication Sig Dispense Refill   digoxin  (LANOXIN ) 0.25 MG tablet Take 1 tablet (0.25 mg total) by mouth daily. 90 tablet 3   furosemide  (LASIX ) 20 MG tablet Take 2 tablets (40 mg total) by mouth daily. 180 tablet 3   metoprolol  (TOPROL  XL) 200 MG 24 hr tablet Take 1 tablet (200 mg total) by mouth daily. 90 tablet 3   sacubitril -valsartan  (ENTRESTO ) 24-26 MG Take 1 tablet by mouth 2 (two) times daily. 180 tablet 3   rivaroxaban  (XARELTO ) 20 MG TABS tablet Take 1 tablet (20 mg total) by mouth daily with supper. 90 tablet 0   Accu-Chek FastClix Lancets MISC USE AS INSTRUCTED TO CHECK BLOOD SUGAR ONCE DAILY. (Patient not taking: Reported on 11/18/2023) 102 each 0   Blood Glucose Monitoring Suppl (ACCU-CHEK GUIDE ME) w/Device KIT 1 kit by Does not apply route daily. (Patient not taking: Reported on 11/18/2023) 1 kit 0   glucose blood (ACCU-CHEK GUIDE) test strip Use as instructed (Patient not taking: Reported on  11/18/2023) 100 strip 0   Misc. Devices MISC Blood pressure monitor.  Diagnosis Hypertension (Patient not taking: Reported on 02/25/2023) 1 each 0   topiramate  (TOPAMAX ) 25 MG tablet Take 1 tablet (25 mg total) by mouth at bedtime. (Patient not taking: Reported on 11/18/2023) 180 tablet 1   acetaminophen  (TYLENOL ) 500 MG tablet Take 1,000 mg by mouth every 6 (six) hours as needed for headache.  (Patient not taking: Reported on 02/25/2023)     atorvastatin  (LIPITOR ) 80 MG tablet Take 1 tablet (80 mg total) by mouth daily at 6 PM. (Patient not taking: Reported on 11/18/2023) 90 tablet 1   dapagliflozin  propanediol (FARXIGA ) 10 MG TABS tablet Take 1 tablet (10 mg total) by mouth daily. (Patient not taking: Reported on 11/18/2023) 90 tablet 1   doxycycline  (VIBRA -TABS) 100 MG tablet Take 1 tablet (100 mg total) by mouth 2 (two) times daily. (Patient not taking: Reported on 11/18/2023) 14 tablet 0   gabapentin  (NEURONTIN ) 300 MG capsule Take 1 capsule (300 mg total) by mouth 2 (two) times daily. (Patient not taking: Reported on 11/18/2023) 180 capsule 1   mupirocin  ointment (BACTROBAN ) 2 % Apply 1 Application topically 2 (two) times daily. (Patient not taking: Reported on 11/18/2023) 22 g 0   ondansetron  (ZOFRAN ) 4 MG tablet Take 1 tablet (4 mg total) by mouth every 8 (eight) hours as needed for nausea or vomiting. (Patient not taking: Reported on 11/18/2023) 20 tablet 0   promethazine  (PHENERGAN ) 12.5 MG tablet Take 1 tablet (12.5 mg total) by mouth every 6 (six) hours as needed for up to 15 doses for nausea or vomiting. (Patient not taking: Reported on 11/18/2023) 15 tablet 0   spironolactone  (ALDACTONE ) 25 MG tablet Take 1 tablet (25 mg total) by mouth daily. (Patient not taking: Reported on 11/18/2023) 90 tablet 1   triamcinolone  cream (KENALOG ) 0.1 % Apply 1 Application topically 2 (two) times daily. (Patient not taking: Reported on 11/18/2023) 30 g 0   No facility-administered medications prior to visit.     ROS Review  of Systems  Constitutional:  Negative for activity change and appetite change.  HENT:  Negative for sinus pressure and sore throat.   Respiratory:  Negative for chest tightness, shortness of breath and wheezing.  Cardiovascular:  Negative for chest pain and palpitations.  Gastrointestinal:  Negative for abdominal distention, abdominal pain and constipation.  Genitourinary: Negative.   Musculoskeletal:        See HPI  Psychiatric/Behavioral:  Negative for behavioral problems and dysphoric mood.     Objective:  BP (!) 164/103   Pulse (!) 102   Ht 5' 4 (1.626 m)   Wt 198 lb 3.2 oz (89.9 kg)   SpO2 100%   BMI 34.02 kg/m      11/18/2023    3:59 PM 11/18/2023    3:04 PM 02/25/2023    9:58 AM  BP/Weight  Systolic BP 164 187 160  Diastolic BP 103 113 90  Wt. (Lbs)  198.2   BMI  34.02 kg/m2       Physical Exam Constitutional:      Appearance: She is well-developed.  Cardiovascular:     Rate and Rhythm: Normal rate. Rhythm irregular.     Heart sounds: Normal heart sounds. No murmur heard. Pulmonary:     Effort: Pulmonary effort is normal.     Breath sounds: Normal breath sounds. No wheezing or rales.  Chest:     Chest wall: No tenderness.  Abdominal:     General: Bowel sounds are normal. There is no distension.     Palpations: Abdomen is soft. There is no mass.     Tenderness: There is no abdominal tenderness.  Musculoskeletal:        General: Normal range of motion.     Right lower leg: No edema.     Left lower leg: No edema.  Skin:    Comments: Varicose vein of right medial upper arm  Neurological:     Mental Status: She is alert and oriented to person, place, and time.  Psychiatric:        Mood and Affect: Mood normal.    Diabetic Foot Exam - Simple   Simple Foot Form Diabetic Foot exam was performed with the following findings: Yes 11/18/2023  3:54 PM  Visual Inspection No deformities, no ulcerations, no other skin breakdown bilaterally: Yes Sensation  Testing Intact to touch and monofilament testing bilaterally: Yes Pulse Check Posterior Tibialis and Dorsalis pulse intact bilaterally: Yes Comments Tenderness on palpation of plantar aspects of bilateral second toe        Latest Ref Rng & Units 02/25/2023   10:07 AM 10/28/2022    4:18 PM 10/11/2022    1:44 PM  CMP  Glucose 70 - 99 mg/dL  91  884   BUN 6 - 24 mg/dL  15  8   Creatinine 9.42 - 1.00 mg/dL  8.94  9.10   Sodium 865 - 144 mmol/L  143  140   Potassium 3.5 - 5.2 mmol/L 4.5  4.4  3.5   Chloride 96 - 106 mmol/L  106  106   CO2 20 - 29 mmol/L  24  24   Calcium  8.7 - 10.2 mg/dL  9.7  8.8   Total Protein 6.5 - 8.1 g/dL   7.4   Total Bilirubin 0.3 - 1.2 mg/dL   0.5   Alkaline Phos 38 - 126 U/L   89   AST 15 - 41 U/L   27   ALT 0 - 44 U/L   23     Lipid Panel     Component Value Date/Time   CHOL 181 02/25/2023 1007   TRIG 70 02/25/2023 1007   HDL 52 02/25/2023 1007   CHOLHDL 6.2  03/29/2021 0036   VLDL 14 03/29/2021 0036   LDLCALC 116 (H) 02/25/2023 1007    CBC    Component Value Date/Time   WBC 8.2 10/11/2022 1344   RBC 4.92 10/11/2022 1344   HGB 13.2 10/11/2022 1344   HCT 42.8 10/11/2022 1344   PLT 185 10/11/2022 1344   MCV 87.0 10/11/2022 1344   MCH 26.8 10/11/2022 1344   MCHC 30.8 10/11/2022 1344   RDW 14.6 10/11/2022 1344   LYMPHSABS 2.1 10/11/2022 1344   MONOABS 0.6 10/11/2022 1344   EOSABS 0.1 10/11/2022 1344   BASOSABS 0.1 10/11/2022 1344    Lab Results  Component Value Date   HGBA1C 6.0 11/18/2023       Assessment & Plan Hypertension Uncontrolled despite metoprolol  and Entresto . Emphasized importance of control to prevent complications. -Medication nonadherence contributing to uncontrolled hypertension - Restart spironolactone . - Encourage consistent medication adherence. - Schedule follow-up in 6 weeks to monitor blood pressure control. -Counseled on blood pressure goal of less than 130/80, low-sodium, DASH diet, medication compliance,  150 minutes of moderate intensity exercise per week. Discussed medication compliance, adverse effects.   Atrial fibrillation -Currently in A-fib Managed with metoprolol  and Xarelto . Emphasized importance of adherence. - Refill prescription for Xarelto . - Encourage consistent use of metoprolol .  Headaches Possibly related to vascular headaches stroke and hypertension. Not severe, occasionally relieved with Tylenol . - Consider restarting Topamax  if headaches become intolerable. - Monitor headache frequency and severity.  Varicose veins Swelling in right arm due to varicose veins.  -No suspicion for DVT -Patient reassured  Foot pain Pain in second toes likely due to inadequate shoe support coupled with prolonged standing as a [employer] - Recommend investing in supportive shoes. - Consider using medium compression socks for additional support.  Type 2 diabetes mellitus Well-controlled with A1c of 6.0. - Continue current diabetes management plan. - Refill prescription for Farxiga .      Meds ordered this encounter  Medications   atorvastatin  (LIPITOR ) 80 MG tablet    Sig: Take 1 tablet (80 mg total) by mouth daily at 6 PM.    Dispense:  90 tablet    Refill:  1   dapagliflozin  propanediol (FARXIGA ) 10 MG TABS tablet    Sig: Take 1 tablet (10 mg total) by mouth daily.    Dispense:  90 tablet    Refill:  1   spironolactone  (ALDACTONE ) 25 MG tablet    Sig: Take 1 tablet (25 mg total) by mouth daily.    Dispense:  90 tablet    Refill:  1   rivaroxaban  (XARELTO ) 20 MG TABS tablet    Sig: Take 1 tablet (20 mg total) by mouth daily with supper.    Dispense:  90 tablet    Refill:  1    Follow-up: Return in about 6 weeks (around 12/30/2023) for Blood Pressure follow-up with PCP.       Corrina Sabin, MD, FAAFP. [facility] [place], [place] [phone]   11/18/2023, 5:12 PM "

## 2023-11-18 NOTE — Patient Instructions (Signed)
 VISIT SUMMARY:  During today's visit, we discussed several of your health concerns, including your headaches, foot pain, varicose veins, and the management of your hypertension, atrial fibrillation, and diabetes. We reviewed your current medications and emphasized the importance of adherence to your prescribed treatment plan. We also discussed lifestyle modifications and follow-up plans to ensure better management of your conditions.  YOUR PLAN:  -HYPERTENSION: Hypertension, or high blood pressure, is a condition where the force of the blood against your artery walls is too high. We need to restart spironolactone and ensure you take all your medications regularly, including Farxiga and atorvastatin. Please follow up in 6 weeks to monitor your blood pressure.  -CARDIAC CONDITION: Your heart condition requires multiple medications to maintain proper function. It is crucial to take all your prescribed medications to prevent your condition from worsening. Please continue with your cardiac medications and follow up with cardiology on May 12.  -ATRIAL FIBRILLATION: Atrial fibrillation is an irregular and often rapid heart rate that can increase your risk of strokes. It is managed with metoprolol and Xarelto. Please ensure you take these medications consistently.  -HEADACHES: Your headaches may be related to your previous stroke and high blood pressure. They are not severe and can be managed with Tylenol. If they become intolerable, we may consider restarting Topamax. Please monitor the frequency and severity of your headaches.  -VARICOSE VEINS: Varicose veins are swollen, twisted veins that can cause discomfort. The swelling and soreness in your right arm are due to varicose veins but are not dangerous. We will keep an eye on this condition.  -FOOT PAIN: The pain in your second toes is likely due to inadequate shoe support. We recommend investing in supportive shoes and considering medium compression socks  for additional support.  -DIABETES MANAGEMENT: Your diabetes is well-controlled with an A1c of 6.0. Please continue with your current diabetes management plan and ensure you take Farxiga as prescribed.  INSTRUCTIONS:  Please follow up in 6 weeks to monitor your blood pressure control. Additionally, ensure you attend your cardiology appointment on May 12.  For more information, you can read your full clinical note, available in your patient portal.

## 2023-11-19 ENCOUNTER — Encounter: Payer: Self-pay | Admitting: Family Medicine

## 2023-11-19 LAB — CMP14+EGFR
ALT: 22 IU/L (ref 0–32)
AST: 27 IU/L (ref 0–40)
Albumin: 4.2 g/dL (ref 3.8–4.9)
Alkaline Phosphatase: 111 IU/L (ref 44–121)
BUN/Creatinine Ratio: 12 (ref 9–23)
BUN: 11 mg/dL (ref 6–24)
Bilirubin Total: 1.1 mg/dL (ref 0.0–1.2)
CO2: 19 mmol/L — ABNORMAL LOW (ref 20–29)
Calcium: 9.7 mg/dL (ref 8.7–10.2)
Chloride: 106 mmol/L (ref 96–106)
Creatinine, Ser: 0.9 mg/dL (ref 0.57–1.00)
Globulin, Total: 3.7 g/dL (ref 1.5–4.5)
Glucose: 89 mg/dL (ref 70–99)
Potassium: 3.7 mmol/L (ref 3.5–5.2)
Sodium: 143 mmol/L (ref 134–144)
Total Protein: 7.9 g/dL (ref 6.0–8.5)
eGFR: 74 mL/min/{1.73_m2} (ref >59)

## 2023-11-19 LAB — MICROALBUMIN / CREATININE URINE RATIO
Creatinine, Urine: 240.6 mg/dL
Microalb/Creat Ratio: 51 mg/g{creat} — ABNORMAL HIGH (ref 0–29)
Microalbumin, Urine: 121.6 ug/mL

## 2023-11-19 LAB — LP+NON-HDL CHOLESTEROL
Cholesterol, Total: 169 mg/dL (ref 100–199)
HDL: 45 mg/dL (ref >39)
LDL Chol Calc (NIH): 109 mg/dL — ABNORMAL HIGH (ref 0–99)
Total Non-HDL-Chol (LDL+VLDL): 124 mg/dL (ref 0–129)
Triglycerides: 77 mg/dL (ref 0–149)
VLDL Cholesterol Cal: 15 mg/dL (ref 5–40)

## 2023-11-20 ENCOUNTER — Other Ambulatory Visit: Payer: Self-pay

## 2023-11-20 LAB — HIV ANTIBODY (ROUTINE TESTING W REFLEX): HIV Screen 4th Generation wRfx: NONREACTIVE

## 2023-11-23 ENCOUNTER — Ambulatory Visit: Payer: Medicaid Other | Admitting: Family Medicine

## 2023-11-29 ENCOUNTER — Other Ambulatory Visit: Payer: Self-pay | Admitting: Student

## 2023-11-29 DIAGNOSIS — I5022 Chronic systolic (congestive) heart failure: Secondary | ICD-10-CM

## 2023-12-01 ENCOUNTER — Other Ambulatory Visit: Payer: Self-pay

## 2023-12-01 MED ORDER — FUROSEMIDE 20 MG PO TABS
40.0000 mg | ORAL_TABLET | Freq: Every day | ORAL | 0 refills | Status: DC
  Filled 2023-12-01: qty 180, 90d supply, fill #0

## 2023-12-01 MED ORDER — METOPROLOL SUCCINATE ER 200 MG PO TB24
200.0000 mg | ORAL_TABLET | Freq: Every day | ORAL | 0 refills | Status: DC
  Filled 2023-12-01: qty 90, 90d supply, fill #0

## 2023-12-01 MED ORDER — DIGOXIN 250 MCG PO TABS
0.2500 mg | ORAL_TABLET | Freq: Every day | ORAL | 0 refills | Status: DC
  Filled 2023-12-01: qty 90, 90d supply, fill #0

## 2023-12-02 ENCOUNTER — Other Ambulatory Visit: Payer: Self-pay

## 2023-12-04 ENCOUNTER — Other Ambulatory Visit: Payer: Self-pay

## 2023-12-04 ENCOUNTER — Other Ambulatory Visit: Payer: Self-pay | Admitting: Physician Assistant

## 2023-12-04 DIAGNOSIS — E119 Type 2 diabetes mellitus without complications: Secondary | ICD-10-CM

## 2023-12-04 MED ORDER — ACCU-CHEK FASTCLIX LANCETS MISC
0 refills | Status: AC
  Filled 2023-12-04: qty 102, 90d supply, fill #0

## 2023-12-04 MED ORDER — GLUCOSE BLOOD VI STRP
ORAL_STRIP | 0 refills | Status: AC
Start: 2023-12-04 — End: ?
  Filled 2023-12-04: qty 100, 33d supply, fill #0

## 2023-12-09 ENCOUNTER — Other Ambulatory Visit: Payer: Self-pay

## 2023-12-09 ENCOUNTER — Other Ambulatory Visit: Payer: Self-pay | Admitting: Student

## 2023-12-10 ENCOUNTER — Other Ambulatory Visit: Payer: Self-pay

## 2023-12-10 MED ORDER — ENTRESTO 24-26 MG PO TABS
1.0000 | ORAL_TABLET | Freq: Two times a day (BID) | ORAL | 0 refills | Status: DC
  Filled 2023-12-10: qty 180, 90d supply, fill #0

## 2023-12-14 ENCOUNTER — Other Ambulatory Visit: Payer: Self-pay

## 2023-12-31 ENCOUNTER — Ambulatory Visit: Admitting: Family Medicine

## 2024-01-25 ENCOUNTER — Ambulatory Visit: Admitting: Cardiovascular Disease

## 2024-01-25 DIAGNOSIS — I10 Essential (primary) hypertension: Secondary | ICD-10-CM

## 2024-01-25 DIAGNOSIS — I5042 Chronic combined systolic (congestive) and diastolic (congestive) heart failure: Secondary | ICD-10-CM

## 2024-02-06 NOTE — Progress Notes (Deleted)
 Cardiology Office Note:    Date:  02/06/2024   ID:  [name], DOB 1966/08/22, MRN [id number]  PCP:  Joaquin Mulberry, MD  Cardiologist:  Luana Rumple, MD { Click to update primary MD,subspecialty MD or APP then REFRESH:1}    Referring MD: Joaquin Mulberry, MD   Chief Complaint: follow-up of CHF  History of Present Illness:    [name] is a 58 y.o. female with a history of non-obstructive CAD on cardiac catheterization in 03/2021, non-ischemic cardiomyopathy/ chronic combined CHF with EF of 25-30% in 03/2021 but normalized to 60-65% on last Echo in 02/2023, chronic atrial fibrillation/ flutter on Xarelto , mild aortic insufficiency, hypertension, dyslipidemia, type 2 diabetes mellitus, prior stroke, severe obstructive sleep apnea, and obesity who is followed by Dr. [name] and presents today for follow-up of CHF.    Patient is primary followed by Cardiology for CHF and atrial fibrillation. She has a long history of atrial fibrillation which is felt to be permanent at this point. She was admitted in 03/2021 with acute CHF after presenting with worsening shortness of breath, edema, and weight gain. Echo showed LVEF of 25-30% with global hypokinesis and indeterminate diastolic parameters, moderately enlarged RV with mildly reduced RV, mild AI, moderate MR, and severe TR. EF was down from 60-65% on prior Echo in 2020. She was diuresed with IV Lasix  and then underwent R/LHC which showed 60% stenosis of mid LAD, 75% stenosis of distal LCX, and 70% stenosis of proximal 1st Diag as well as mild pulmonary hypertension. Medical therapy was recommended. She was started GDMT and transitioned to PO Lasix . Cardiomyopathy was felt to be tachymediated in nature.   Patient was last seen by me in 10/2022 at which time she reported lower extremity edema as well as a 10 lb weight gain over the last 2 months. However, she was otherwise doing well from a cardiac standpoint. Pro-BNP was elevated at 755 but did not  rule her in for CHF based on age. Losartan  was stopped and she was switched to Entresto . Toprol -XL was also increased for better rate control. Repeat Echo was ordered to reassess LV function and showed LVEF of 60-65% with normal wall motion and moderate LVH, normal RV with mildly elevated PASP of 37.5 mmHg, moderate biatrial enlargement, mild AI, and mild TR (no evidence of MR). She was lost to follow-up after this after cancelling or no showing multiple appointments.   Patient presents today for follow-up.  ***  Chronic Combined CHF Non-Ischemic Cardiomyopathy (Presumed Tachy-Mediated) Echo in 03/2021 showed LVEF of 25-30% with global hypokinesis and indeterminate diastolic parameters, moderately enlarged RV with mildly reduced RV, mild AI, moderate MR, and severe TR. R/LHC at the time showed non-obstructive CAD that was out of proportion to degree of cardiomyopathy. GDMT was adjusted and EF normalized. Most recent Echo in 02/2023 showed LVEF of 60-65% with normal wall motion and moderate LVH, normal RV with mildly elevated PASP of 37.5 mmHg, moderate biatrial enlargement, mild AI, and mild TR (no evidence of MR). - Euvolemic on exam. Weight *** - Continue Lasix  40mg  daily. - Continue Entresto  24-26mg  twice daily.  - Currently on Toprol -XL 200 mg daily.  - Continue Spironolactone  25mg  daily. - Continue Farxiga  10mg  daily. - Continue  daily weight and sodium/ fluid restrictions.  - Will repeat BMET.   Chronic Atrial Fibrillation Long history of atrial fibrillation that is felt to likely be permanent at this time.  - Rates *** - Continue Toprol -XL 200mg  daily.  - Currently  on Digoxin  0.25mg  daily. Given EF as normalized, will stop Digoxin  and start Diltiazem  *** per Dr. [name] recommendation at visit in 07/2021. *** - Continue chronic anticoagulation with Xarelto  20mg  daily.   CAD LHC in 03/2021 showed 60% stenosis of mid LAD, 75% stenosis of distal LCX, and 70% stenosis of proximal 1st Diag.  Medical therapy was recommended at that time. - No chest pain.  - No aspirin  due to need for DOAC. - Continue beta-blocker and statin.   Aortic Insufficiency Mitral Regurgitation Echo in 03/2021 showed reduced EF of 25-30% with mild AI and moderate AI. Most recent Echo in 02/2023 showed normalization of EF with persistent mild AI but no evidence of MR (suggestive of functional MR in setting of reduced EF).  - Can continue to monitor with routine serial Echos.    Hypertension BP *** - Continue medications for CHF as above.    Dyslipidemia Most recent lipid panel in 11/2023: Total Cholesterol 169, Triglycerides 77, HDL 45, LDL 109.  LDL goal <70 given CAD.  - Continue Lipitor  80mg  daily. ***compliance ***   Type 2 Diabetes Mellitus Hemoglobin A1 6.0 in 11/2023. - On Farxiga  as above. - Management per PCP.  EKGs/Labs/Other Studies Reviewed:    The following studies were reviewed today:  Echocardiogram 03/28/2021: Impressions:  1. Left ventricular ejection fraction, by estimation, is 25 to 30%. The  left ventricle has severely decreased function. The left ventricle  demonstrates global hypokinesis. There is mild left ventricular  hypertrophy. Left ventricular diastolic parameters   are indeterminate.   2. Right ventricular systolic function is mildly reduced. The right  ventricular size is moderately enlarged. There is normal pulmonary artery  systolic pressure. The estimated right ventricular systolic pressure is  17.4 mmHg.   3. Left atrial size was mildly dilated.   4. Right atrial size was moderately dilated.   5. The mitral valve is normal in structure. Moderate mitral valve  regurgitation. No evidence of mitral stenosis.   6. Tricuspid valve regurgitation is severe. Suspect functional from  annular dilatation.   7. The aortic valve is tricuspid. Aortic valve regurgitation is mild. No  aortic stenosis is present.   8. The inferior vena cava is dilated in size with >50%  respiratory  variability, suggesting right atrial pressure of 8 mmHg.   9. The patient was in atrial fibrillation.  10. Significant change in LV and RV function compared to prior echo.  _______________   Right/Left Cardiac Catheterization 04/02/2021: 60% mid LAD after second diagonal. 75% distal circumflex before small third obtuse marginal. Right dominant anatomy Mild pulmonary hypertension with mean pressure 31 mmHg, mean capillary wedge pressure 16 mmHg, pulmonary vascular resistance 2.56 Wood units. Pulmonary artery O2 saturation 73%, cardiac output and index are 5.87 L/min and 3.01 L/min/m respectively.   Recommendations: Guideline directed therapy for systolic dysfunction Rate control Further management per treating team.   Diagnostic Dominance: Right  _______________  Echocardiogram 02/27/2023: Impressions: 1. Left ventricular ejection fraction, by estimation, is 60 to 65%. The  left ventricle has normal function. The left ventricle has no regional  wall motion abnormalities. There is moderate concentric left ventricular  hypertrophy. Left ventricular  diastolic function could not be evaluated.   2. Right ventricular systolic function is normal. The right ventricular  size is normal. There is mildly elevated pulmonary artery systolic  pressure. The estimated right ventricular systolic pressure is 37.5 mmHg.   3. Left atrial size was moderately dilated.   4. Right atrial size was  moderately dilated.   5. The mitral valve is normal in structure. No evidence of mitral valve  regurgitation. No evidence of mitral stenosis.   6. The aortic valve is tricuspid. There is moderate calcification of the  aortic valve. Aortic valve regurgitation is mild. No aortic stenosis is  present.   7. The inferior vena cava is normal in size with greater than 50%  respiratory variability, suggesting right atrial pressure of 3 mmHg.   Conclusion(s)/Recommendation(s): LV and RV function have  normalized since  last echo.   EKG:  EKG ordered today. EKG personally reviewed and demonstrates ***.  Recent Labs: 11/18/2023: ALT 22; BUN 11; Creatinine, Ser 0.90; Potassium 3.7; Sodium 143  Recent Lipid Panel    Component Value Date/Time   CHOL 169 11/18/2023 1631   TRIG 77 11/18/2023 1631   HDL 45 11/18/2023 1631   CHOLHDL 6.2 03/29/2021 0036   VLDL 14 03/29/2021 0036   LDLCALC 109 (H) 11/18/2023 1631    Physical Exam:    Vital Signs: There were no vitals taken for this visit.    Wt Readings from Last 3 Encounters:  11/18/23 198 lb 3.2 oz (89.9 kg)  02/25/23 201 lb (91.2 kg)  10/29/22 199 lb 3.2 oz (90.4 kg)     General: 58 y.o. female in no acute distress. HEENT: Normocephalic and atraumatic. Sclera clear.  Neck: Supple. No carotid bruits. No JVD. Heart: *** RRR. Distinct S1 and S2. No murmurs, gallops, or rubs.  Lungs: No increased work of breathing. Clear to ausculation bilaterally. No wheezes, rhonchi, or rales.  Abdomen: Soft, non-distended, and non-tender to palpation.  Extremities: No lower extremity edema.  Radial and distal pedal pulses 2+ and equal bilaterally. Skin: Warm and dry. Neuro: No focal deficits. Psych: Normal affect. Responds appropriately.   Assessment:    No diagnosis found.  Plan:     Disposition: Follow up in ***   Signed, Casimer Clear, PA-C  02/06/2024 4:24 PM    [facility]

## 2024-02-15 ENCOUNTER — Ambulatory Visit: Attending: Student | Admitting: Student

## 2024-03-01 ENCOUNTER — Ambulatory Visit: Admitting: Family Medicine

## 2024-04-12 ENCOUNTER — Telehealth: Payer: Self-pay | Admitting: Family Medicine

## 2024-04-12 NOTE — Telephone Encounter (Signed)
 Called patient to confirm upcoming appointment 04/13/2024 at 3:10 pm. Patient appointment has been successfully confirmed

## 2024-04-13 ENCOUNTER — Ambulatory Visit: Attending: Family Medicine | Admitting: Family Medicine

## 2024-04-13 ENCOUNTER — Encounter: Payer: Self-pay | Admitting: Family Medicine

## 2024-04-13 ENCOUNTER — Other Ambulatory Visit: Payer: Self-pay

## 2024-04-13 VITALS — BP 169/91 | HR 96 | Ht 64.0 in | Wt 198.0 lb

## 2024-04-13 DIAGNOSIS — E1169 Type 2 diabetes mellitus with other specified complication: Secondary | ICD-10-CM | POA: Diagnosis not present

## 2024-04-13 DIAGNOSIS — G4489 Other headache syndrome: Secondary | ICD-10-CM

## 2024-04-13 DIAGNOSIS — E1140 Type 2 diabetes mellitus with diabetic neuropathy, unspecified: Secondary | ICD-10-CM

## 2024-04-13 DIAGNOSIS — Z1211 Encounter for screening for malignant neoplasm of colon: Secondary | ICD-10-CM

## 2024-04-13 DIAGNOSIS — K5909 Other constipation: Secondary | ICD-10-CM

## 2024-04-13 DIAGNOSIS — I152 Hypertension secondary to endocrine disorders: Secondary | ICD-10-CM

## 2024-04-13 DIAGNOSIS — E1159 Type 2 diabetes mellitus with other circulatory complications: Secondary | ICD-10-CM

## 2024-04-13 DIAGNOSIS — I482 Chronic atrial fibrillation, unspecified: Secondary | ICD-10-CM | POA: Diagnosis not present

## 2024-04-13 DIAGNOSIS — E1149 Type 2 diabetes mellitus with other diabetic neurological complication: Secondary | ICD-10-CM

## 2024-04-13 LAB — POCT GLYCOSYLATED HEMOGLOBIN (HGB A1C): HbA1c, POC (controlled diabetic range): 6.1 % (ref 0.0–7.0)

## 2024-04-13 MED ORDER — GABAPENTIN 300 MG PO CAPS
300.0000 mg | ORAL_CAPSULE | Freq: Every day | ORAL | 1 refills | Status: DC
  Filled 2024-04-13: qty 90, 90d supply, fill #0

## 2024-04-13 NOTE — Patient Instructions (Signed)
 VISIT SUMMARY:  Today, we discussed your blood pressure, neuropathy symptoms, atrial fibrillation, headaches, and constipation. We reviewed your current medications and made some adjustments to help manage your conditions more effectively.  YOUR PLAN:  -TYPE 2 DIABETES MELLITUS WITH DIABETIC NEUROPATHY: Diabetic neuropathy is nerve damage caused by diabetes, often resulting in pain or numbness in the feet. Your A1c level is 6.1, which shows good blood sugar control. We will start you on gabapentin  300 mg at night to help with the foot pain. Please be aware that this medication can cause drowsiness. We will also check your A1c, kidney function, and cholesterol levels today. Additionally, please schedule an eye exam as part of your diabetes care.  -ATRIAL FIBRILLATION: Atrial fibrillation is an irregular and often rapid heart rate that can increase your risk of strokes and other heart-related issues. You are currently on Xarelto  and metoprolol , which you should continue. Please reschedule your missed cardiology appointment to ensure your condition is monitored properly.  -ESSENTIAL HYPERTENSION: Hypertension is high blood pressure, which can lead to serious health problems if not managed. Your home readings range from 132 to 150 systolic, and stress and missed doses have contributed to higher readings. We will recheck your blood pressure before you leave today and follow up in one month to manage your blood pressure more effectively.  -HEADACHES: You experience intermittent headaches and use Topamax  as needed. Continue using Topamax  when you have headaches.  -CONSTIPATION: Constipation is infrequent or difficult bowel movements. Increasing your fruit intake and continuing to eat Activia yogurt for probiotics can help alleviate this issue.  INSTRUCTIONS:  Please follow up in one month for blood pressure management. Reschedule your cardiology appointment as soon as possible. We will check your A1c,  kidney function, and cholesterol levels today. Also, schedule an eye exam to monitor your diabetes.

## 2024-04-13 NOTE — Progress Notes (Signed)
 Subjective:  Patient ID: [name], female    DOB: May 18, 1966  Age: 58 y.o. MRN: [id number]  CC: Medical Management of Chronic Issues (Pain in feet at night/)     Discussed the use of AI scribe software for clinical note transcription with the patient, who gave verbal consent to proceed.  History of Present Illness [name] is a 58 year old female with hypertension and diabetes who presents for follow-up of her blood pressure and neuropathy symptoms.  She has resumed her blood pressure medication but did not take it today due to not eating. Home blood pressure readings are 132 mmHg, occasionally reaching 142 mmHg. Previously, readings were as high as 164/103 mmHg when not taking medication regularly. She attributes some elevated readings to stress related to her son's incarceration. She takes her medication in the afternoon.  She experiences nocturnal foot pain, described as an ache rather than paresthesia, and is unsure if it is neuropathy-related. The pain is not exacerbated by contact with sheets. She has not used gabapentin  and is concerned about diabetic neuropathy.  She missed a cardiology appointment for atrial fibrillation due to family issues. Her medications include spironolactone , Entresto , Xarelto , and metoprolol , with sufficient refills. She is aware of her atrial fibrillation episodes and can recognize rhythm changes.    Past Medical History:  Diagnosis Date   Clotting disorder (HCC)    on xarelto    Essential hypertension    Heart murmur    a. 10/2015 Echo: EF 60-65%, no rwma, mild AI/MR, sev dil LA/RA, PASP .   Noncompliance    NSVT (nonsustained ventricular tachycardia) (HCC)    a. 10/2015 during admission for CVA/AF.   Persistent atrial fibrillation (HCC)    a. CHA2DS2VASC = 4-->Xarelto ;  b. 02/2016 Successful DCCV.  c. Recurrent fib   Pneumonia 2012 x 2; 2015   Stroke [facility])    a. 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   Transient ischemic attack (TIA) 01/2013    Past Surgical History:  Procedure Laterality Date   CARDIOVERSION N/A 02/18/2013   Procedure: CARDIOVERSION;  Surgeon: Jerel Balding, MD;  Location: [facility] ENDOSCOPY;  Service: Cardiovascular;  Laterality: N/A;   CARDIOVERSION N/A 02/23/2013   Procedure: CARDIOVERSION;  Surgeon: Alm LELON Clay, MD;  Location[facility] OR;  Service: Cardiovascular;  Laterality: N/A;  BESIDE CV   CARDIOVERSION N/A 03/12/2016   Procedure: CARDIOVERSION;  Surgeon: Ezra GORMAN Shuck, MD;  Location: [facility] ENDOSCOPY;  Service: Cardiovascular;  Laterality: N/A;   RIGHT/LEFT HEART CATH AND CORONARY ANGIOGRAPHY N/A 04/02/2021   Procedure: RIGHT/LEFT HEART CATH AND CORONARY ANGIOGRAPHY;  Surgeon: Claudene Victory LELON, MD;  Location: [facility] INVASIVE CV LAB;  Service: Cardiovascular;  Laterality: N/A;   TEE WITHOUT CARDIOVERSION N/A 02/18/2013   Procedure: TRANSESOPHAGEAL ECHOCARDIOGRAM (TEE);  Surgeon: Jerel Balding, MD;  Location: [facility] ENDOSCOPY;  Service: Cardiovascular;  Laterality: N/A;   TUBAL LIGATION  1992    Family History  Problem Relation Age of Onset   Cancer Mother    Atrial fibrillation Mother    Breast cancer Mother    Hypertension Father    Atrial fibrillation Father    Peripheral vascular disease Father    Hypertension Other    Diabetes Other    Heart attack Neg Hx    Stroke Neg Hx    Rectal cancer Neg Hx    Colon cancer Neg Hx    Esophageal cancer Neg Hx  Stomach cancer Neg Hx     Social History   Socioeconomic History   Marital status: Single    Spouse name: Not on file   Number of children: Not on file   Years of education: Not on file   Highest education level: Associate degree: occupational, Scientist, product/process development, or vocational program  Occupational History   Occupation: Multimedia programmer: [employer]   Occupation: in home health aide  Tobacco Use   Smoking status: Former    Current packs/day: 0.00     Types: Cigarettes    Start date: 03/15/2010    Quit date: 03/15/2013    Years since quitting: 11.0   Smokeless tobacco: Never  Vaping Use   Vaping status: Never Used  Substance and Sexual Activity   Alcohol use: Yes    Comment: drinks on the weekends,   2-3 beers   Drug use: No   Sexual activity: Not Currently    Birth control/protection: None  Other Topics Concern   Not on file  Social History Narrative   Not on file   Social Drivers of Health   Financial Resource Strain: Low Risk  (11/18/2023)   Overall Financial Resource Strain (CARDIA)    Difficulty of Paying Living Expenses: Not hard at all  Food Insecurity: No Food Insecurity (11/18/2023)   Hunger Vital Sign    Worried About Running Out of Food in the Last Year: Never true    Ran Out of Food in the Last Year: Never true  Transportation Needs: Unmet Transportation Needs (11/18/2023)   PRAPARE - Transportation    Lack of Transportation (Medical): Yes    Lack of Transportation (Non-Medical): Yes  Physical Activity: Unknown (11/18/2023)   Exercise Vital Sign    Days of Exercise per Week: 5 days    Minutes of Exercise per Session: Patient declined  Stress: Stress Concern Present (11/18/2023)   [employer] of Occupational Health - Occupational Stress Questionnaire    Feeling of Stress : Rather much  Social Connections: Unknown (11/18/2023)   Social Connection and Isolation Panel    Frequency of Communication with Friends and Family: Patient declined    Frequency of Social Gatherings with Friends and Family: Patient declined    Attends Religious Services: Patient declined    Database administrator or Organizations: No    Attends Engineer, structural: Not on file    Marital Status: Never married    Allergies  Allergen Reactions   Penicillins Hives and Other (See Comments)    Unknown  Has patient had a PCN reaction causing immediate rash, facial/tongue/throat swelling, SOB or lightheadedness with hypotension:  No Has patient had a PCN reaction causing severe rash involving mucus membranes or skin necrosis: No Has patient had a PCN reaction that required hospitalization No Has patient had a PCN reaction occurring within the last 10 years: No If all of the above answers are NO, then may proceed with Cephalosporin use.    Outpatient Medications Prior to Visit  Medication Sig Dispense Refill   Accu-Chek FastClix Lancets MISC USE AS INSTRUCTED TO CHECK BLOOD SUGAR ONCE DAILY. 102 each 0   atorvastatin  (LIPITOR ) 80 MG tablet Take 1 tablet (80 mg total) by mouth daily at 6 PM. 90 tablet 1   Blood Glucose Monitoring Suppl (ACCU-CHEK GUIDE ME) w/Device KIT 1 kit by Does not apply route daily. 1 kit 0   dapagliflozin  propanediol (FARXIGA ) 10 MG TABS tablet Take 1 tablet (10 mg total) by mouth  daily. 90 tablet 1   digoxin  (LANOXIN ) 0.25 MG tablet Take 1 tablet (0.25 mg total) by mouth daily. 90 tablet 0   furosemide  (LASIX ) 20 MG tablet Take 2 tablets (40 mg total) by mouth daily.Pt must keep upcoming appt in May 2025 with Dr. Francyne before anymore refills. Thank you Final Attempt 180 tablet 0   glucose blood test strip Use as instructed 100 strip 0   metoprolol  (TOPROL  XL) 200 MG 24 hr tablet Take 1 tablet (200 mg total) by mouth daily.Pt must keep upcoming appt in May 2025 with Dr. Francyne before anymore refills. Thank you Final Attempt 90 tablet 0   Misc. Devices MISC Blood pressure monitor.  Diagnosis Hypertension 1 each 0   rivaroxaban  (XARELTO ) 20 MG TABS tablet Take 1 tablet (20 mg total) by mouth daily with supper. 90 tablet 1   sacubitril -valsartan  (ENTRESTO ) 24-26 MG Take 1 tablet by mouth 2 (two) times daily. 180 tablet 0   spironolactone  (ALDACTONE ) 25 MG tablet Take 1 tablet (25 mg total) by mouth daily. 90 tablet 1   topiramate  (TOPAMAX ) 25 MG tablet Take 1 tablet (25 mg total) by mouth at bedtime. 180 tablet 1   No facility-administered medications prior to visit.     ROS Review of  Systems *** Objective:  BP (!) 169/91   Pulse 96   Ht 5' 4 (1.626 m)   Wt 198 lb (89.8 kg)   SpO2 100%   BMI 33.99 kg/m      04/13/2024    3:15 PM 11/18/2023    3:59 PM 11/18/2023    3:04 PM  BP/Weight  Systolic BP 169 164 187  Diastolic BP 91 103 113  Wt. (Lbs) 198  198.2  BMI 33.99 kg/m2  34.02 kg/m2      Physical Exam ***    Latest Ref Rng & Units 11/18/2023    4:31 PM 02/25/2023   10:07 AM 10/28/2022    4:18 PM  CMP  Glucose 70 - 99 mg/dL 89   91   BUN 6 - 24 mg/dL 11   15   Creatinine 9.42 - 1.00 mg/dL 9.09   8.94   Sodium 865 - 144 mmol/L 143   143   Potassium 3.5 - 5.2 mmol/L 3.7  4.5  4.4   Chloride 96 - 106 mmol/L 106   106   CO2 20 - 29 mmol/L 19   24   Calcium  8.7 - 10.2 mg/dL 9.7   9.7   Total Protein 6.0 - 8.5 g/dL 7.9     Total Bilirubin 0.0 - 1.2 mg/dL 1.1     Alkaline Phos 44 - 121 IU/L 111     AST 0 - 40 IU/L 27     ALT 0 - 32 IU/L 22       Lipid Panel     Component Value Date/Time   CHOL 169 11/18/2023 1631   TRIG 77 11/18/2023 1631   HDL 45 11/18/2023 1631   CHOLHDL 6.2 03/29/2021 0036   VLDL 14 03/29/2021 0036   LDLCALC 109 (H) 11/18/2023 1631    CBC    Component Value Date/Time   WBC 8.2 10/11/2022 1344   RBC 4.92 10/11/2022 1344   HGB 13.2 10/11/2022 1344   HCT 42.8 10/11/2022 1344   PLT 185 10/11/2022 1344   MCV 87.0 10/11/2022 1344   MCH 26.8 10/11/2022 1344   MCHC 30.8 10/11/2022 1344   RDW 14.6 10/11/2022 1344   LYMPHSABS 2.1 10/11/2022 1344  MONOABS 0.6 10/11/2022 1344   EOSABS 0.1 10/11/2022 1344   BASOSABS 0.1 10/11/2022 1344    Lab Results  Component Value Date   HGBA1C 6.0 11/18/2023      There are no diagnoses linked to this encounter.  Meds ordered this encounter  Medications   gabapentin  (NEURONTIN ) 300 MG capsule    Sig: Take 1 capsule (300 mg total) by mouth at bedtime.    Dispense:  90 capsule    Refill:  1    Follow-up: Return in about 1 month (around 05/14/2024) for Chronic medical conditions,  Blood Pressure follow-up with PCP.       Corrina Sabin, MD, FAAFP. [facility] [place], [place] [phone]   04/13/2024, 4:18 PM

## 2024-04-14 ENCOUNTER — Encounter: Payer: Self-pay | Admitting: Family Medicine

## 2024-04-14 ENCOUNTER — Other Ambulatory Visit: Payer: Self-pay

## 2024-04-14 ENCOUNTER — Ambulatory Visit: Payer: Self-pay | Admitting: Family Medicine

## 2024-04-14 LAB — CMP14+EGFR
ALT: 17 IU/L (ref 0–32)
AST: 21 IU/L (ref 0–40)
Albumin: 4.3 g/dL (ref 3.8–4.9)
Alkaline Phosphatase: 109 IU/L (ref 44–121)
BUN/Creatinine Ratio: 14 (ref 9–23)
BUN: 13 mg/dL (ref 6–24)
Bilirubin Total: 0.7 mg/dL (ref 0.0–1.2)
CO2: 21 mmol/L (ref 20–29)
Calcium: 9.9 mg/dL (ref 8.7–10.2)
Chloride: 106 mmol/L (ref 96–106)
Creatinine, Ser: 0.9 mg/dL (ref 0.57–1.00)
Globulin, Total: 3.8 g/dL (ref 1.5–4.5)
Glucose: 94 mg/dL (ref 70–99)
Potassium: 4.4 mmol/L (ref 3.5–5.2)
Sodium: 138 mmol/L (ref 134–144)
Total Protein: 8.1 g/dL (ref 6.0–8.5)
eGFR: 74 mL/min/1.73 (ref >59)

## 2024-04-14 LAB — LP+NON-HDL CHOLESTEROL
Cholesterol, Total: 201 mg/dL — ABNORMAL HIGH (ref 100–199)
HDL: 49 mg/dL (ref >39)
LDL Chol Calc (NIH): 140 mg/dL — ABNORMAL HIGH (ref 0–99)
Total Non-HDL-Chol (LDL+VLDL): 152 mg/dL — ABNORMAL HIGH (ref 0–129)
Triglycerides: 67 mg/dL (ref 0–149)
VLDL Cholesterol Cal: 12 mg/dL (ref 5–40)

## 2024-04-14 MED ORDER — ROSUVASTATIN CALCIUM 20 MG PO TABS
20.0000 mg | ORAL_TABLET | Freq: Every day | ORAL | 1 refills | Status: DC
Start: 2024-04-14 — End: 2024-05-09
  Filled 2024-04-14: qty 90, 90d supply, fill #0

## 2024-04-14 MED ORDER — FREESTYLE LIBRE 3 SENSOR MISC
1.0000 | 11 refills | Status: AC
  Filled 2024-04-14 – 2024-07-04 (×2): qty 2, 28d supply, fill #0
  Filled 2024-09-11: qty 3, fill #0

## 2024-04-14 MED ORDER — FREESTYLE LIBRE 3 READER DEVI
1.0000 | 0 refills | Status: AC
  Filled 2024-04-14: qty 1, 34d supply, fill #0
  Filled 2024-07-04: qty 1, 30d supply, fill #0
  Filled 2024-09-11: qty 1, fill #0

## 2024-04-15 ENCOUNTER — Other Ambulatory Visit: Payer: Self-pay

## 2024-04-20 ENCOUNTER — Other Ambulatory Visit: Payer: Self-pay

## 2024-04-21 ENCOUNTER — Other Ambulatory Visit: Payer: Self-pay

## 2024-04-22 ENCOUNTER — Other Ambulatory Visit: Payer: Self-pay

## 2024-04-25 ENCOUNTER — Other Ambulatory Visit: Payer: Self-pay

## 2024-05-05 ENCOUNTER — Encounter (HOSPITAL_COMMUNITY)
Admission: EM | Disposition: A | Discharge status: Rehabilitation Facility | Payer: Self-pay | Source: Home / Self Care | Attending: Neurology

## 2024-05-05 ENCOUNTER — Inpatient Hospital Stay (HOSPITAL_COMMUNITY)
Admission: EM | Admit: 2024-05-05 | Discharge: 2024-05-09 | DRG: 024 | Disposition: A | Discharge status: Rehabilitation Facility | Attending: Neurology | Admitting: Neurology

## 2024-05-05 ENCOUNTER — Inpatient Hospital Stay (HOSPITAL_COMMUNITY)

## 2024-05-05 ENCOUNTER — Emergency Department (HOSPITAL_COMMUNITY)

## 2024-05-05 ENCOUNTER — Other Ambulatory Visit: Payer: Self-pay

## 2024-05-05 ENCOUNTER — Inpatient Hospital Stay (HOSPITAL_COMMUNITY): Admitting: Certified Registered"

## 2024-05-05 ENCOUNTER — Encounter (HOSPITAL_COMMUNITY): Payer: Self-pay

## 2024-05-05 DIAGNOSIS — I482 Chronic atrial fibrillation, unspecified: Secondary | ICD-10-CM | POA: Diagnosis not present

## 2024-05-05 DIAGNOSIS — I739 Peripheral vascular disease, unspecified: Secondary | ICD-10-CM | POA: Diagnosis not present

## 2024-05-05 DIAGNOSIS — Z5982 Transportation insecurity: Secondary | ICD-10-CM

## 2024-05-05 DIAGNOSIS — R29713 NIHSS score 13: Secondary | ICD-10-CM | POA: Diagnosis not present

## 2024-05-05 DIAGNOSIS — R29712 NIHSS score 12: Secondary | ICD-10-CM | POA: Diagnosis not present

## 2024-05-05 DIAGNOSIS — D689 Coagulation defect, unspecified: Secondary | ICD-10-CM | POA: Diagnosis present

## 2024-05-05 DIAGNOSIS — I4819 Other persistent atrial fibrillation: Secondary | ICD-10-CM | POA: Diagnosis present

## 2024-05-05 DIAGNOSIS — Z833 Family history of diabetes mellitus: Secondary | ICD-10-CM

## 2024-05-05 DIAGNOSIS — I63511 Cerebral infarction due to unspecified occlusion or stenosis of right middle cerebral artery: Secondary | ICD-10-CM | POA: Diagnosis not present

## 2024-05-05 DIAGNOSIS — I10 Essential (primary) hypertension: Secondary | ICD-10-CM

## 2024-05-05 DIAGNOSIS — Z6834 Body mass index (BMI) 34.0-34.9, adult: Secondary | ICD-10-CM | POA: Diagnosis not present

## 2024-05-05 DIAGNOSIS — R414 Neurologic neglect syndrome: Secondary | ICD-10-CM | POA: Diagnosis present

## 2024-05-05 DIAGNOSIS — Z79899 Other long term (current) drug therapy: Secondary | ICD-10-CM | POA: Diagnosis not present

## 2024-05-05 DIAGNOSIS — I4891 Unspecified atrial fibrillation: Secondary | ICD-10-CM | POA: Diagnosis not present

## 2024-05-05 DIAGNOSIS — Z91148 Patient's other noncompliance with medication regimen for other reason: Secondary | ICD-10-CM | POA: Diagnosis not present

## 2024-05-05 DIAGNOSIS — I5032 Chronic diastolic (congestive) heart failure: Secondary | ICD-10-CM | POA: Diagnosis not present

## 2024-05-05 DIAGNOSIS — I639 Cerebral infarction, unspecified: Secondary | ICD-10-CM

## 2024-05-05 DIAGNOSIS — I110 Hypertensive heart disease with heart failure: Secondary | ICD-10-CM | POA: Diagnosis present

## 2024-05-05 DIAGNOSIS — R930 Abnormal findings on diagnostic imaging of skull and head, not elsewhere classified: Secondary | ICD-10-CM | POA: Diagnosis not present

## 2024-05-05 DIAGNOSIS — E785 Hyperlipidemia, unspecified: Secondary | ICD-10-CM | POA: Diagnosis not present

## 2024-05-05 DIAGNOSIS — Z8249 Family history of ischemic heart disease and other diseases of the circulatory system: Secondary | ICD-10-CM

## 2024-05-05 DIAGNOSIS — I63429 Cerebral infarction due to embolism of unspecified anterior cerebral artery: Secondary | ICD-10-CM | POA: Diagnosis not present

## 2024-05-05 DIAGNOSIS — R29810 Facial weakness: Secondary | ICD-10-CM | POA: Diagnosis not present

## 2024-05-05 DIAGNOSIS — D72829 Elevated white blood cell count, unspecified: Secondary | ICD-10-CM | POA: Diagnosis not present

## 2024-05-05 DIAGNOSIS — I700 Atherosclerosis of aorta: Secondary | ICD-10-CM | POA: Diagnosis not present

## 2024-05-05 DIAGNOSIS — I2510 Atherosclerotic heart disease of native coronary artery without angina pectoris: Secondary | ICD-10-CM | POA: Diagnosis not present

## 2024-05-05 DIAGNOSIS — I428 Other cardiomyopathies: Secondary | ICD-10-CM | POA: Diagnosis not present

## 2024-05-05 DIAGNOSIS — K5901 Slow transit constipation: Secondary | ICD-10-CM | POA: Diagnosis not present

## 2024-05-05 DIAGNOSIS — Z803 Family history of malignant neoplasm of breast: Secondary | ICD-10-CM

## 2024-05-05 DIAGNOSIS — Z7982 Long term (current) use of aspirin: Secondary | ICD-10-CM

## 2024-05-05 DIAGNOSIS — G4733 Obstructive sleep apnea (adult) (pediatric): Secondary | ICD-10-CM | POA: Diagnosis not present

## 2024-05-05 DIAGNOSIS — I6389 Other cerebral infarction: Secondary | ICD-10-CM

## 2024-05-05 DIAGNOSIS — T45516A Underdosing of anticoagulants, initial encounter: Secondary | ICD-10-CM | POA: Diagnosis not present

## 2024-05-05 DIAGNOSIS — I5042 Chronic combined systolic (congestive) and diastolic (congestive) heart failure: Secondary | ICD-10-CM | POA: Diagnosis not present

## 2024-05-05 DIAGNOSIS — I5020 Unspecified systolic (congestive) heart failure: Secondary | ICD-10-CM | POA: Diagnosis not present

## 2024-05-05 DIAGNOSIS — I63521 Cerebral infarction due to unspecified occlusion or stenosis of right anterior cerebral artery: Secondary | ICD-10-CM | POA: Diagnosis not present

## 2024-05-05 DIAGNOSIS — E119 Type 2 diabetes mellitus without complications: Secondary | ICD-10-CM | POA: Diagnosis not present

## 2024-05-05 DIAGNOSIS — I69391 Dysphagia following cerebral infarction: Secondary | ICD-10-CM

## 2024-05-05 DIAGNOSIS — Z87891 Personal history of nicotine dependence: Secondary | ICD-10-CM | POA: Diagnosis not present

## 2024-05-05 DIAGNOSIS — I63311 Cerebral infarction due to thrombosis of right middle cerebral artery: Secondary | ICD-10-CM

## 2024-05-05 DIAGNOSIS — R233 Spontaneous ecchymoses: Secondary | ICD-10-CM | POA: Diagnosis not present

## 2024-05-05 DIAGNOSIS — F54 Psychological and behavioral factors associated with disorders or diseases classified elsewhere: Secondary | ICD-10-CM | POA: Diagnosis not present

## 2024-05-05 DIAGNOSIS — Z7901 Long term (current) use of anticoagulants: Secondary | ICD-10-CM | POA: Diagnosis not present

## 2024-05-05 DIAGNOSIS — I63411 Cerebral infarction due to embolism of right middle cerebral artery: Principal | ICD-10-CM | POA: Diagnosis present

## 2024-05-05 DIAGNOSIS — I161 Hypertensive emergency: Secondary | ICD-10-CM | POA: Diagnosis present

## 2024-05-05 DIAGNOSIS — I69354 Hemiplegia and hemiparesis following cerebral infarction affecting left non-dominant side: Secondary | ICD-10-CM | POA: Diagnosis not present

## 2024-05-05 DIAGNOSIS — G8190 Hemiplegia, unspecified affecting unspecified side: Secondary | ICD-10-CM | POA: Diagnosis not present

## 2024-05-05 DIAGNOSIS — E139 Other specified diabetes mellitus without complications: Secondary | ICD-10-CM | POA: Diagnosis not present

## 2024-05-05 DIAGNOSIS — R739 Hyperglycemia, unspecified: Secondary | ICD-10-CM | POA: Diagnosis not present

## 2024-05-05 DIAGNOSIS — R29818 Other symptoms and signs involving the nervous system: Secondary | ICD-10-CM | POA: Diagnosis not present

## 2024-05-05 DIAGNOSIS — E669 Obesity, unspecified: Secondary | ICD-10-CM | POA: Diagnosis not present

## 2024-05-05 DIAGNOSIS — G8194 Hemiplegia, unspecified affecting left nondominant side: Secondary | ICD-10-CM | POA: Diagnosis present

## 2024-05-05 DIAGNOSIS — I63421 Cerebral infarction due to embolism of right anterior cerebral artery: Secondary | ICD-10-CM | POA: Diagnosis not present

## 2024-05-05 DIAGNOSIS — G8111 Spastic hemiplegia affecting right dominant side: Secondary | ICD-10-CM | POA: Diagnosis not present

## 2024-05-05 DIAGNOSIS — I6601 Occlusion and stenosis of right middle cerebral artery: Secondary | ICD-10-CM | POA: Diagnosis not present

## 2024-05-05 DIAGNOSIS — R531 Weakness: Secondary | ICD-10-CM | POA: Diagnosis not present

## 2024-05-05 DIAGNOSIS — R29714 NIHSS score 14: Secondary | ICD-10-CM | POA: Diagnosis not present

## 2024-05-05 DIAGNOSIS — I4821 Permanent atrial fibrillation: Secondary | ICD-10-CM | POA: Diagnosis not present

## 2024-05-05 DIAGNOSIS — E66811 Obesity, class 1: Secondary | ICD-10-CM | POA: Diagnosis not present

## 2024-05-05 DIAGNOSIS — Z5986 Financial insecurity: Secondary | ICD-10-CM

## 2024-05-05 DIAGNOSIS — K5900 Constipation, unspecified: Secondary | ICD-10-CM | POA: Diagnosis not present

## 2024-05-05 HISTORY — PX: IR PERCUTANEOUS ART THROMBECTOMY/INFUSION INTRACRANIAL INC DIAG ANGIO: IMG6087

## 2024-05-05 HISTORY — PX: RADIOLOGY WITH ANESTHESIA: SHX6223

## 2024-05-05 HISTORY — PX: IR US GUIDE VASC ACCESS RIGHT: IMG2390

## 2024-05-05 LAB — DIFFERENTIAL
Abs Immature Granulocytes: 0.04 K/uL (ref 0.00–0.07)
Basophils Absolute: 0.1 K/uL (ref 0.0–0.1)
Basophils Relative: 1 %
Eosinophils Absolute: 0.1 K/uL (ref 0.0–0.5)
Eosinophils Relative: 1 %
Immature Granulocytes: 0 %
Lymphocytes Relative: 28 %
Lymphs Abs: 2.6 K/uL (ref 0.7–4.0)
Monocytes Absolute: 0.6 K/uL (ref 0.1–1.0)
Monocytes Relative: 7 %
Neutro Abs: 5.8 K/uL (ref 1.7–7.7)
Neutrophils Relative %: 63 %

## 2024-05-05 LAB — CBC
HCT: 40.7 % (ref 36.0–46.0)
Hemoglobin: 12.4 g/dL (ref 12.0–15.0)
MCH: 26.6 pg (ref 26.0–34.0)
MCHC: 30.5 g/dL (ref 30.0–36.0)
MCV: 87.3 fL (ref 80.0–100.0)
Platelets: 180 K/uL (ref 150–400)
RBC: 4.66 MIL/uL (ref 3.87–5.11)
RDW: 14 % (ref 11.5–15.5)
WBC: 9.3 K/uL (ref 4.0–10.5)
nRBC: 0 % (ref 0.0–0.2)

## 2024-05-05 LAB — COMPREHENSIVE METABOLIC PANEL WITH GFR
ALT: 26 U/L (ref 0–44)
AST: 33 U/L (ref 15–41)
Albumin: 3.6 g/dL (ref 3.5–5.0)
Alkaline Phosphatase: 77 U/L (ref 38–126)
Anion gap: 13 (ref 5–15)
BUN: 13 mg/dL (ref 6–20)
CO2: 22 mmol/L (ref 22–32)
Calcium: 9.3 mg/dL (ref 8.9–10.3)
Chloride: 107 mmol/L (ref 98–111)
Creatinine, Ser: 1.19 mg/dL — ABNORMAL HIGH (ref 0.44–1.00)
GFR, Estimated: 53 mL/min — ABNORMAL LOW (ref >60)
Glucose, Bld: 114 mg/dL — ABNORMAL HIGH (ref 70–99)
Potassium: 3.6 mmol/L (ref 3.5–5.1)
Sodium: 142 mmol/L (ref 135–145)
Total Bilirubin: 0.5 mg/dL (ref 0.0–1.2)
Total Protein: 7.6 g/dL (ref 6.5–8.1)

## 2024-05-05 LAB — ECHOCARDIOGRAM COMPLETE
AR max vel: 2.5 cm2
AV Peak grad: 6.2 mmHg
Ao pk vel: 1.24 m/s
Area-P 1/2: 5.75 cm2
Height: 64 in
S' Lateral: 2.5 cm
Weight: 3248 [oz_av]

## 2024-05-05 LAB — HEMOGLOBIN A1C
Hgb A1c MFr Bld: 6.1 % — ABNORMAL HIGH (ref 4.8–5.6)
Mean Plasma Glucose: 128.37 mg/dL

## 2024-05-05 LAB — PROTIME-INR
INR: 1.1 (ref 0.8–1.2)
Prothrombin Time: 15.1 s (ref 11.4–15.2)

## 2024-05-05 LAB — I-STAT CHEM 8, ED
BUN: 15 mg/dL (ref 6–20)
Calcium, Ion: 1.16 mmol/L (ref 1.15–1.40)
Chloride: 107 mmol/L (ref 98–111)
Creatinine, Ser: 1.1 mg/dL — ABNORMAL HIGH (ref 0.44–1.00)
Glucose, Bld: 110 mg/dL — ABNORMAL HIGH (ref 70–99)
HCT: 41 % (ref 36.0–46.0)
Hemoglobin: 13.9 g/dL (ref 12.0–15.0)
Potassium: 3.6 mmol/L (ref 3.5–5.1)
Sodium: 144 mmol/L (ref 135–145)
TCO2: 23 mmol/L (ref 22–32)

## 2024-05-05 LAB — CBG MONITORING, ED: Glucose-Capillary: 100 mg/dL — ABNORMAL HIGH (ref 70–99)

## 2024-05-05 LAB — ETHANOL: Alcohol, Ethyl (B): <15 mg/dL (ref <15)

## 2024-05-05 LAB — APTT: aPTT: 26 s (ref 24–36)

## 2024-05-05 LAB — GLUCOSE, CAPILLARY: Glucose-Capillary: 184 mg/dL — ABNORMAL HIGH (ref 70–99)

## 2024-05-05 LAB — MRSA NEXT GEN BY PCR, NASAL: MRSA by PCR Next Gen: NOT DETECTED

## 2024-05-05 SURGERY — RADIOLOGY WITH ANESTHESIA
Anesthesia: General

## 2024-05-05 MED ORDER — PHENYLEPHRINE 80 MCG/ML (10ML) SYRINGE FOR IV PUSH (FOR BLOOD PRESSURE SUPPORT)
PREFILLED_SYRINGE | INTRAVENOUS | Status: DC | PRN
  Administered 2024-05-05: 80 ug via INTRAVENOUS
  Administered 2024-05-05 (×2): 160 ug via INTRAVENOUS
  Administered 2024-05-05: 80 ug via INTRAVENOUS

## 2024-05-05 MED ORDER — ACETAMINOPHEN 325 MG PO TABS
650.0000 mg | ORAL_TABLET | ORAL | Status: DC | PRN
  Filled 2024-05-05 (×2): qty 2

## 2024-05-05 MED ORDER — SODIUM CHLORIDE 0.9 % IV SOLN: INTRAVENOUS | Status: AC

## 2024-05-05 MED ORDER — ONDANSETRON HCL 4 MG/2ML IJ SOLN
INTRAMUSCULAR | Status: DC | PRN
  Administered 2024-05-05: 4 mg via INTRAVENOUS

## 2024-05-05 MED ORDER — PROPOFOL 10 MG/ML IV BOLUS
INTRAVENOUS | Status: DC | PRN
  Administered 2024-05-05: 80 mg via INTRAVENOUS

## 2024-05-05 MED ORDER — ROCURONIUM BROMIDE 10 MG/ML (PF) SYRINGE
PREFILLED_SYRINGE | INTRAVENOUS | Status: DC | PRN
  Administered 2024-05-05: 30 mg via INTRAVENOUS
  Administered 2024-05-05: 10 mg via INTRAVENOUS

## 2024-05-05 MED ORDER — SODIUM CHLORIDE 0.9 % IV BOLUS: 250.0000 mL | INTRAVENOUS | Status: AC | PRN

## 2024-05-05 MED ORDER — PHENYLEPHRINE HCL-NACL 20-0.9 MG/250ML-% IV SOLN
INTRAVENOUS | Status: DC | PRN
  Administered 2024-05-05: 50 ug/min via INTRAVENOUS

## 2024-05-05 MED ORDER — SENNOSIDES-DOCUSATE SODIUM 8.6-50 MG PO TABS: 1.0000 | ORAL_TABLET | Freq: Every evening | ORAL | Status: DC | PRN

## 2024-05-05 MED ORDER — ORAL CARE MOUTH RINSE
15.0000 mL | OROMUCOSAL | Status: DC
  Administered 2024-05-05 – 2024-05-09 (×16): 15 mL via OROMUCOSAL

## 2024-05-05 MED ORDER — SUGAMMADEX SODIUM 200 MG/2ML IV SOLN
INTRAVENOUS | Status: DC | PRN
  Administered 2024-05-05: 200 mg via INTRAVENOUS

## 2024-05-05 MED ORDER — INSULIN ASPART 100 UNIT/ML IJ SOLN
0.0000 [IU] | INTRAMUSCULAR | Status: DC
  Administered 2024-05-05: 3 [IU] via SUBCUTANEOUS
  Administered 2024-05-06 (×4): 2 [IU] via SUBCUTANEOUS

## 2024-05-05 MED ORDER — CLEVIDIPINE BUTYRATE 0.5 MG/ML IV EMUL: 0.0000 mg/h | INTRAVENOUS | Status: DC

## 2024-05-05 MED ORDER — CHLORHEXIDINE GLUCONATE CLOTH 2 % EX PADS
6.0000 | MEDICATED_PAD | Freq: Every day | CUTANEOUS | Status: DC
  Administered 2024-05-05 – 2024-05-06 (×2): 6 via TOPICAL

## 2024-05-05 MED ORDER — LIDOCAINE 2% (20 MG/ML) 5 ML SYRINGE
INTRAMUSCULAR | Status: DC | PRN
  Administered 2024-05-05: 100 mg via INTRAVENOUS

## 2024-05-05 MED ORDER — ACETAMINOPHEN 650 MG RE SUPP: 650.0000 mg | RECTAL | Status: DC | PRN

## 2024-05-05 MED ORDER — IOHEXOL 300 MG/ML  SOLN
150.0000 mL | Freq: Once | INTRAMUSCULAR | Status: AC | PRN
Start: 2024-05-05 — End: 2024-05-05
  Administered 2024-05-05: 100 mL via INTRA_ARTERIAL

## 2024-05-05 MED ORDER — STROKE: EARLY STAGES OF RECOVERY BOOK
Freq: Once | Status: AC
  Filled 2024-05-05: qty 1

## 2024-05-05 MED ORDER — IOHEXOL 350 MG/ML SOLN
75.0000 mL | Freq: Once | INTRAVENOUS | Status: AC | PRN
  Administered 2024-05-05: 75 mL via INTRAVENOUS

## 2024-05-05 MED ORDER — ROSUVASTATIN CALCIUM 20 MG PO TABS
20.0000 mg | ORAL_TABLET | Freq: Every day | ORAL | Status: DC
  Administered 2024-05-05 – 2024-05-08 (×4): 20 mg via ORAL
  Filled 2024-05-05 (×4): qty 1

## 2024-05-05 MED ORDER — IOHEXOL 300 MG/ML  SOLN
50.0000 mL | Freq: Once | INTRAMUSCULAR | Status: AC | PRN
  Administered 2024-05-05: 12 mL via INTRA_ARTERIAL

## 2024-05-05 MED ORDER — ORAL CARE MOUTH RINSE
15.0000 mL | OROMUCOSAL | Status: DC | PRN
Start: 2024-05-05 — End: 2024-05-09

## 2024-05-05 MED ORDER — ACETAMINOPHEN 160 MG/5ML PO SOLN
650.0000 mg | ORAL | Status: DC | PRN
  Administered 2024-05-05: 650 mg
  Filled 2024-05-05: qty 20.3

## 2024-05-05 MED ORDER — LABETALOL HCL 5 MG/ML IV SOLN
INTRAVENOUS | Status: DC | PRN
  Administered 2024-05-05 (×2): 5 mg via INTRAVENOUS

## 2024-05-05 MED ORDER — FENTANYL CITRATE (PF) 100 MCG/2ML IJ SOLN
INTRAMUSCULAR | Status: AC
  Filled 2024-05-05: qty 2

## 2024-05-05 MED ORDER — SUCCINYLCHOLINE CHLORIDE 200 MG/10ML IV SOSY
PREFILLED_SYRINGE | INTRAVENOUS | Status: DC | PRN
  Administered 2024-05-05: 120 mg via INTRAVENOUS

## 2024-05-05 MED ORDER — DEXAMETHASONE SODIUM PHOSPHATE 10 MG/ML IJ SOLN
INTRAMUSCULAR | Status: DC | PRN
  Administered 2024-05-05: 10 mg via INTRAVENOUS

## 2024-05-05 MED ORDER — DIGOXIN 250 MCG PO TABS
0.2500 mg | ORAL_TABLET | Freq: Every day | ORAL | Status: DC
  Filled 2024-05-05 (×2): qty 1

## 2024-05-05 MED ORDER — ALBUMIN HUMAN 5 % IV SOLN: INTRAVENOUS | Status: DC | PRN

## 2024-05-05 NOTE — Brief Op Note (Signed)
 05/05/2024  1:15 PM  PATIENT:  [name]  58 y.o. female  PRE-OPERATIVE DIAGNOSIS:  Stroke  POST-OPERATIVE DIAGNOSIS:  Stroke  PROCEDURE:  Mechanical Thrombectomy - TICI IIb  SURGEON:  Dino HERO. Rosslyn, MD  PHYSICIAN ASSISTANT:   ASSISTANTS: none   ANESTHESIA:   general  EBL:  10 mL   BLOOD ADMINISTERED:none  DRAINS: none   LOCAL MEDICATIONS USED:  LIDOCAINE    SPECIMEN:  No Specimen  DISPOSITION OF SPECIMEN:  N/A  COUNTS:  YES  TOURNIQUET:  * No tourniquets in log *  DICTATION: .Dragon Dictation  PLAN OF CARE: Admit to inpatient   PATIENT DISPOSITION:  ICU - extubated and stable.   Delay start of Pharmacological VTE agent (>24hrs) due to surgical blood loss or risk of bleeding: not applicable

## 2024-05-05 NOTE — Anesthesia Preprocedure Evaluation (Signed)
 Anesthesia Evaluation  Patient identified by MRN, date of birth, ID band Patient confused  General Assessment Comment:  Acute code stroke, patient with left sided weakness and facial droop. Patient able to speak and tell us  she ate lunch recently, able to identify herself. Does not speak for certain questions. Questionable mental state.  Reviewed: Allergy & Precautions, NPO status , Patient's Chart, lab work & pertinent test results, Unable to perform ROS - Chart review onlyPreop documentation limited or incomplete due to emergent nature of procedure.  History of Anesthesia Complications Negative for: history of anesthetic complications  Airway Mallampati: III  TM Distance: >3 FB Neck ROM: Full    Dental no notable dental hx. (+) Teeth Intact   Pulmonary neg pulmonary ROS, neg sleep apnea, neg COPD, Patient abstained from smoking.Not current smoker, former smoker   Pulmonary exam normal breath sounds clear to auscultation       Cardiovascular Exercise Tolerance: Good METShypertension, + CAD and +CHF  (-) Past MI + dysrhythmias Atrial Fibrillation  Rhythm:Regular Rate:Tachycardia - Systolic murmurs  1. Left ventricular ejection fraction, by estimation, is 60 to 65%. The  left ventricle has normal function. The left ventricle has no regional  wall motion abnormalities. There is moderate concentric left ventricular  hypertrophy. Left ventricular  diastolic function could not be evaluated.   2. Right ventricular systolic function is normal. The right ventricular  size is normal. There is mildly elevated pulmonary artery systolic  pressure. The estimated right ventricular systolic pressure is 37.5 mmHg.   3. Left atrial size was moderately dilated.   4. Right atrial size was moderately dilated.   5. The mitral valve is normal in structure. No evidence of mitral valve  regurgitation. No evidence of mitral stenosis.   6. The aortic  valve is tricuspid. There is moderate calcification of the  aortic valve. Aortic valve regurgitation is mild. No aortic stenosis is  present.   7. The inferior vena cava is normal in size with greater than 50%  respiratory variability, suggesting right atrial pressure of 3 mmHg.     Neuro/Psych  Headaches Acute stroke CVA  negative psych ROS   GI/Hepatic ,neg GERD  ,,(+)     (-) substance abuse    Endo/Other  diabetes    Renal/GU negative Renal ROS     Musculoskeletal   Abdominal  (+) + obese  Peds  Hematology   Anesthesia Other Findings Past Medical History: No date: Clotting disorder (HCC)     Comment:  on xarelto  No date: Essential hypertension No date: Heart murmur     Comment:  a. 10/2015 Echo: EF 60-65%, no rwma, mild AI/MR, sev dil               LA/RA, PASP . No date: Noncompliance No date: NSVT (nonsustained ventricular tachycardia) (HCC)     Comment:  a. 10/2015 during admission for CVA/AF. No date: Persistent atrial fibrillation (HCC)     Comment:  a. CHA2DS2VASC = 4-->Xarelto ;  b. 02/2016 Successful               DCCV.  c. Recurrent fib 2012 x 2; 2015: Pneumonia No date: Stroke [facility])     Comment:  a. 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 01/2013: Transient ischemic attack (TIA)  Reproductive/Obstetrics                              Anesthesia Physical Anesthesia Plan  ASA: 4 and emergent  Anesthesia Plan: General   Post-op Pain Management:    Induction: Intravenous and Rapid sequence  PONV Risk Score and Plan: 3 and Ondansetron  and Dexamethasone   Airway Management Planned: Oral ETT  Additional Equipment: None  Intra-op Plan:   Post-operative Plan: Extubation in OR and Possible Post-op intubation/ventilation  Informed Consent: I have reviewed the patients History and Physical, chart, labs and  discussed the procedure including the risks, benefits and alternatives for the proposed anesthesia with the patient or authorized representative who has indicated his/her understanding and acceptance.     Only emergency history available  Plan Discussed with: CRNA and Surgeon  Anesthesia Plan Comments: (Emergency implied consent, unable to perform complete informed consent process due to time sensitive nature of procedure.)        Anesthesia Quick Evaluation

## 2024-05-05 NOTE — Sedation Documentation (Signed)
 Patient moved to table by staff , secured, hooked to monitors, and now under the care of anesthesia. Please see charting in vitals per CRNA.

## 2024-05-05 NOTE — Anesthesia Postprocedure Evaluation (Signed)
 Anesthesia Post Note  Patient: [name]  Procedure(s) Performed: RADIOLOGY WITH ANESTHESIA     Patient location during evaluation: PACU Anesthesia Type: General Level of consciousness: awake and alert, oriented and patient cooperative Pain management: pain level controlled Vital Signs Assessment: post-procedure vital signs reviewed and stable Respiratory status: spontaneous breathing, nonlabored ventilation and respiratory function stable Cardiovascular status: blood pressure returned to baseline and stable Postop Assessment: no apparent nausea or vomiting Anesthetic complications: no   No notable events documented.  Last Vitals:  Vitals:   05/05/24 1400 05/05/24 1415  BP: (!) 149/90 (!) 158/89  Pulse: 96 94  Resp: (!) 23 17  Temp:    SpO2: 98% 93%    Last Pain:  Vitals:   05/05/24 1336  PainSc: 0-No pain    LLE Motor Response: No movement to painful stimulus (05/05/24 1415) LLE Sensation: Full sensation (05/05/24 1415) RLE Motor Response: Responds to commands;Purposeful movement (05/05/24 1415) RLE Sensation: Full sensation (05/05/24 1415)      [name]

## 2024-05-05 NOTE — Anesthesia Procedure Notes (Signed)
 Procedure Name: Intubation Date/Time: 05/05/2024 11:53 AM  Performed by: Boone Fess, MDPre-anesthesia Checklist: Patient identified, Patient being monitored, Timeout performed, Emergency Drugs available and Suction available Patient Re-evaluated:Patient Re-evaluated prior to induction Oxygen Delivery Method: Circle system utilized Preoxygenation: Pre-oxygenation with 100% oxygen Induction Type: IV induction and Rapid sequence Laryngoscope Size: 3 and McGrath (Mcgrath X-blade 3) Grade View: Grade I Tube type: Oral Tube size: 7.0 mm Number of attempts: 2 Airway Equipment and Method: Stylet Placement Confirmation: ETT inserted through vocal cords under direct vision, positive ETCO2 and breath sounds checked- equal and bilateral Secured at: 21 cm Tube secured with: Tape Dental Injury: Teeth and Oropharynx as per pre-operative assessment  Difficulty Due To: Difficulty was unanticipated and Difficult Airway- due to anterior larynx Comments: First attempt by CRNA utilizing Miller blade with poor visualization. Second attempt by MD utilizing McGrath with success.

## 2024-05-05 NOTE — Progress Notes (Addendum)
 Patient arrived to 4[facility]ICU with belongings from PACU post-IR. Patient is alert and oriented to self, place, time, and situation.  [name] Ahle, daughter of the patient, at the bedside and explained plan of care for the rest of the shift, [facility] specific policies, and established a patient password for added patient security. [name] and [employer] the primary emergency contacts, and updated information as needed.  Dayon Witt, RN

## 2024-05-05 NOTE — ED Provider Notes (Signed)
 [place] EMERGENCY DEPARTMENT AT [facility] Provider Note   CSN: [id number] Arrival date & time: 05/05/24  1111     Patient presents with: No chief complaint on file.   [name] is a 58 y.o. female.   [name] is a 58 y.o. female history of stroke hypertension, persistent atrial fibrillation, who presents to the emergency department via EMS as a code stroke.  Patient was at work when at 10:20 AM coworkers noticed aphasia left-sided facial droop and left arm weakness.  Patient has history of prior stroke but had excellent recovery from this.  She works in a [employer].  Is reportedly on Xarelto  for her A-fib but has missed multiple doses recently.  Upon EMS arrival on the scene patient had persistent left-sided facial droop left upper extremity weakness and aphasia.  Patient having difficulty speaking on arrival so unable to contribute much to history.  The history is provided by the patient, the EMS personnel and medical records.       Prior to Admission medications   Medication Sig Start Date End Date Taking? Authorizing Provider  Accu-Chek FastClix Lancets MISC USE AS INSTRUCTED TO CHECK BLOOD SUGAR ONCE DAILY. 12/04/23   Newlin, Enobong, MD  Blood Glucose Monitoring Suppl (ACCU-CHEK GUIDE ME) w/Device KIT 1 kit by Does not apply route daily. 05/02/19   Delbert Clam, MD  Continuous Glucose Receiver (FREESTYLE LIBRE 3 READER) DEVI Apply as directed to test blood glucose 04/14/24   Delbert Clam, MD  Continuous Glucose Sensor (FREESTYLE LIBRE 3 SENSOR) MISC Place 1 sensor on the skin every 14 days. Use to check glucose continuously 04/14/24   Newlin, Enobong, MD  dapagliflozin  propanediol (FARXIGA ) 10 MG TABS tablet Take 1 tablet (10 mg total) by mouth daily. 11/18/23   Newlin, Enobong, MD  digoxin  (LANOXIN ) 0.25 MG tablet Take 1 tablet (0.25 mg total) by mouth daily. 12/01/23   Croitoru, Mihai, MD  furosemide  (LASIX ) 20 MG tablet Take 2  tablets (40 mg total) by mouth daily.Pt must keep upcoming appt in May 2025 with Dr. Francyne before anymore refills. Thank you Final Attempt 12/01/23   Croitoru, Jerel, MD  gabapentin  (NEURONTIN ) 300 MG capsule Take 1 capsule (300 mg total) by mouth at bedtime. 04/13/24   Newlin, Enobong, MD  glucose blood test strip Use as instructed 12/04/23   Newlin, Enobong, MD  metoprolol  (TOPROL  XL) 200 MG 24 hr tablet Take 1 tablet (200 mg total) by mouth daily.Pt must keep upcoming appt in May 2025 with Dr. Francyne before anymore refills. Thank you Final Attempt 12/01/23   Croitoru, Jerel, MD  Misc. Devices MISC Blood pressure monitor.  Diagnosis Hypertension 02/11/19   Delbert Clam, MD  rivaroxaban  (XARELTO ) 20 MG TABS tablet Take 1 tablet (20 mg total) by mouth daily with supper. 11/18/23   Newlin, Enobong, MD  rosuvastatin  (CRESTOR ) 20 MG tablet Take 1 tablet (20 mg total) by mouth daily. 04/14/24   Newlin, Enobong, MD  sacubitril -valsartan  (ENTRESTO ) 24-26 MG Take 1 tablet by mouth 2 (two) times daily. 12/10/23   Croitoru, Mihai, MD  spironolactone  (ALDACTONE ) 25 MG tablet Take 1 tablet (25 mg total) by mouth daily. 11/18/23   Newlin, Enobong, MD  topiramate  (TOPAMAX ) 25 MG tablet Take 1 tablet (25 mg total) by mouth at bedtime. 11/18/22   Newlin, Enobong, MD  albuterol  (PROVENTIL  HFA;VENTOLIN  HFA) 108 (90 Base) MCG/ACT inhaler Inhale 2 puffs into the lungs every 6 (six) hours as needed for wheezing or shortness  of breath. Patient not taking: Reported on 03/28/2021 11/17/18 03/28/21  Newlin, Enobong, MD  fluticasone  (FLONASE ) 50 MCG/ACT nasal spray Place 2 sprays into both nostrils daily. Patient not taking: Reported on 03/28/2021 12/14/18 03/28/21  Brien Belvie BRAVO, MD    Allergies: Penicillins    Review of Systems  Unable to perform ROS: Acuity of condition    Updated Vital Signs Ht 5' 4 (1.626 m)   Wt 92.4 kg   BMI 34.97 kg/m   Physical Exam Vitals and nursing note reviewed.  Constitutional:       General: She is not in acute distress.    Appearance: Normal appearance. She is well-developed. She is not diaphoretic.     Comments: Alert with difficulty speaking but able to answer some questions  HENT:     Head: Normocephalic and atraumatic.  Eyes:     General:        Right eye: No discharge.        Left eye: No discharge.     Comments: Eyes cross midline but left visual field cut present, pupils equal round and reactive  Cardiovascular:     Rate and Rhythm: Regular rhythm. Tachycardia present.     Pulses: Normal pulses.     Heart sounds: Normal heart sounds.     Comments: Mildly tachycardic on arrival Pulmonary:     Effort: Pulmonary effort is normal. No respiratory distress.     Breath sounds: Normal breath sounds. No wheezing or rales.     Comments: Respirations equal and unlabored, patient able to speak in full sentences, lungs clear to auscultation bilaterally  Abdominal:     General: Bowel sounds are normal. There is no distension.     Palpations: Abdomen is soft.     Tenderness: There is no abdominal tenderness.     Comments: Abdomen soft, nondistended, nontender to palpation in all quadrants without guarding or peritoneal signs  Musculoskeletal:        General: No deformity.     Cervical back: Neck supple.  Skin:    General: Skin is warm and dry.     Capillary Refill: Capillary refill takes less than 2 seconds.  Neurological:     Mental Status: She is alert.     Comments: Patient noted to have left-sided facial droop, left hemianopia, dysphagia, left upper and lower extremity weakness, decreased sensation in the left upper and lower extremity and left-sided neglect.  No right-sided deficits present.  Psychiatric:        Mood and Affect: Mood normal.        Behavior: Behavior normal.     (all labs ordered are listed, but only abnormal results are displayed) Labs Reviewed  I-STAT CHEM 8, ED - Abnormal; Notable for the following components:      Result Value    Creatinine, Ser 1.10 (*)    Glucose, Bld 110 (*)    All other components within normal limits  CBG MONITORING, ED - Abnormal; Notable for the following components:   Glucose-Capillary 100 (*)    All other components within normal limits  CBC  DIFFERENTIAL  ETHANOL  PROTIME-INR  APTT  COMPREHENSIVE METABOLIC PANEL WITH GFR  RAPID URINE DRUG SCREEN, HOSP PERFORMED    EKG: None  Radiology:  CT HEAD CODE STROKE WO CONTRAST Result Date: 05/05/2024 CLINICAL DATA:  Code stroke. Neuro deficit, acute, stroke suspected. Left-sided facial droop. Left arm weakness. EXAM: CT ANGIOGRAPHY HEAD AND NECK TECHNIQUE: Multidetector CT imaging of the head and neck  was performed using the standard protocol during bolus administration of intravenous contrast. Multiplanar CT image reconstructions and MIPs were obtained to evaluate the vascular anatomy. Carotid stenosis measurements (when applicable) are obtained utilizing NASCET criteria, using the distal internal carotid diameter as the denominator. RADIATION DOSE REDUCTION: This exam was performed according to the departmental dose-optimization program which includes automated exposure control, adjustment of the mA and/or kV according to patient size and/or use of iterative reconstruction technique. CONTRAST:  75mL OMNIPAQUE  IOHEXOL  350 MG/ML SOLN COMPARISON:  Head CT 10/11/2022.  CTA head/neck 12/09/2017. FINDINGS: CT HEAD FINDINGS Brain: No age-advanced or lobar predominant cerebral atrophy. Known chronic infarcts within the posterior frontal lobe, parietal lobe and temporal lobe on the right (MCA vascular territory). Small chronic cerebellar infarcts also again demonstrated. There is no acute intracranial hemorrhage. No acute demarcated cortical infarct. No extra-axial fluid collection. No evidence of an intracranial mass. No midline shift. Vascular: Hyperdense M1 right middle cerebral artery. Atherosclerotic calcifications. Skull: No calvarial fracture or  aggressive osseous lesion. Sinuses/Orbits: No mass or acute finding within the imaged orbits. No significant paranasal sinus disease at the imaged levels. ASPECTS [facility] Stroke Program Early CT Score) - Ganglionic level infarction (caudate, lentiform nuclei, internal capsule, insula, M1-M3 cortex): 7 - Supraganglionic infarction (M4-M6 cortex): 3 Total score (0-10 with 10 being normal): 10 (when discounting chronic infarcts). Review of the MIP images confirms the above findings These results were called by telephone at the time of interpretation on 05/05/2024 at 11:37 am to provider MCNEILL [facility] , who verbally acknowledged these results. CTA NECK FINDINGS Aortic arch: Standard aortic branching. Atherosclerotic plaque within the aortic arch and proximal major branch vessels of the neck. Streak/beam hardening artifact arising from a dense contrast bolus partially obscures the right subclavian artery. Within this limitation, there is no appreciable hemodynamically significant innominate or proximal subclavian artery stenosis. Right carotid system: CCA and ICA patent within the neck. Progressive calcified and noncalcified atherosclerotic plaque within the proximal ICA resulting in less than 50% stenosis. Left carotid system: CCA and ICA patent within the neck. Calcified and noncalcified Nonstenotic atherosclerotic plaque at the CCA origin. Progressive calcified and noncalcified plaque within the proximal ICA resulting in 65% stenosis. Vertebral arteries: Patent within the neck without stenosis. The right vertebral artery is dominant. Skeleton: Spondylosis at the cervical and visualized upper thoracic levels. No acute fracture or aggressive osseous lesion. Other neck: No neck mass or cervical lymphadenopathy. Upper chest: No consolidation within the imaged lung apices. Review of the MIP images confirms the above findings CTA HEAD FINDINGS Anterior circulation: The intracranial internal carotid arteries are  patent. Nonstenotic atherosclerotic plaque within both vessels. 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 The left M1 segment is patent. No left M2 proximal branch occlusion or high-grade proximal stenosis identified. The anterior cerebral arteries are patent. No intracranial aneurysm is identified. Posterior circulation: The intracranial vertebral arteries are patent. The basilar artery is patent. The intracranial vertebral arteries and basilar artery are developmentally small and there are sizable posterior communicating arteries bilaterally. The posterior cerebral arteries are patent. Hypoplastic P1 segments bilaterally. Venous sinuses: Within the limitations of contrast timing, no convincing thrombus. Anatomic variants: As described. Review of the MIP images confirms the above findings CTA head impression #1 called by telephone at the time of interpretation on 05/05/2024 at 11:37 am to provider MCNEILL [facility] , who verbally acknowledged these results. IMPRESSION: Non-contrast head CT: 1. No  acute intracranial hemorrhage or acute demarcated cortical infarct. 2. Redemonstrated chronic infarcts within the right MCA vascular territory and within the cerebellum. CTA neck: 1. The common carotid and internal carotid arteries are patent within the neck. Plaque within the proximal right ICA resulting in less than 50% stenosis, progressed since the CTA of 02/08/2018. Progressive plaque within the proximal left ICA resulting in 65% stenosis. 2. Vertebral arteries patent within neck without stenosis. 3. Aortic Atherosclerosis (ICD10-I70.0). CTA head: 1. 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 2. Non-stenotic atherosclerotic plaque within the intracranial ICAs. Electronically Signed   By: Rockey Childs D.O.   On: 05/05/2024 11:47   CT ANGIO HEAD NECK W WO CM  (CODE STROKE) Result Date: 05/05/2024 CLINICAL DATA:  Code stroke. Neuro deficit, acute, stroke suspected. Left-sided facial droop. Left arm weakness. EXAM: CT ANGIOGRAPHY HEAD AND NECK TECHNIQUE: Multidetector CT imaging of the head and neck was performed using the standard protocol during bolus administration of intravenous contrast. Multiplanar CT image reconstructions and MIPs were obtained to evaluate the vascular anatomy. Carotid stenosis measurements (when applicable) are obtained utilizing NASCET criteria, using the distal internal carotid diameter as the denominator. RADIATION DOSE REDUCTION: This exam was performed according to the departmental dose-optimization program which includes automated exposure control, adjustment of the mA and/or kV according to patient size and/or use of iterative reconstruction technique. CONTRAST:  75mL OMNIPAQUE  IOHEXOL  350 MG/ML SOLN COMPARISON:  Head CT 10/11/2022.  CTA head/neck 12/09/2017. FINDINGS: CT HEAD FINDINGS Brain: No age-advanced or lobar predominant cerebral atrophy. Known chronic infarcts within the posterior frontal lobe, parietal lobe and temporal lobe on the right (MCA vascular territory). Small chronic cerebellar infarcts also again demonstrated. There is no acute intracranial hemorrhage. No acute demarcated cortical infarct. No extra-axial fluid collection. No evidence of an intracranial mass. No midline shift. Vascular: Hyperdense M1 right middle cerebral artery. Atherosclerotic calcifications. Skull: No calvarial fracture or aggressive osseous lesion. Sinuses/Orbits: No mass or acute finding within the imaged orbits. No significant paranasal sinus disease at the imaged levels. ASPECTS [facility] Stroke Program Early CT Score) - Ganglionic level infarction (caudate, lentiform nuclei, internal capsule, insula, M1-M3 cortex): 7 - Supraganglionic infarction (M4-M6 cortex): 3 Total score (0-10 with 10 being normal): 10 (when discounting chronic infarcts).  Review of the MIP images confirms the above findings These results were called by telephone at the time of interpretation on 05/05/2024 at 11:37 am to provider MCNEILL [facility] , who verbally acknowledged these results. CTA NECK FINDINGS Aortic arch: Standard aortic branching. Atherosclerotic plaque within the aortic arch and proximal major branch vessels of the neck. Streak/beam hardening artifact arising from a dense contrast bolus partially obscures the right subclavian artery. Within this limitation, there is no appreciable hemodynamically significant innominate or proximal subclavian artery stenosis. Right carotid system: CCA and ICA patent within the neck. Progressive calcified and noncalcified atherosclerotic plaque within the proximal ICA resulting in less than 50% stenosis. Left carotid system: CCA and ICA patent within the neck. Calcified and noncalcified Nonstenotic atherosclerotic plaque at the CCA origin. Progressive calcified and noncalcified plaque within the proximal ICA resulting in 65% stenosis. Vertebral arteries: Patent within the neck without stenosis. The right vertebral artery is dominant. Skeleton: Spondylosis at the cervical and visualized upper thoracic levels. No acute fracture or aggressive osseous lesion. Other neck: No neck mass or cervical lymphadenopathy. Upper chest: No consolidation within the imaged lung apices. Review of the MIP images confirms the  above findings CTA HEAD FINDINGS Anterior circulation: The intracranial internal carotid arteries are patent. Nonstenotic atherosclerotic plaque within both vessels. 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 The left M1 segment is patent. No left M2 proximal branch occlusion or high-grade proximal stenosis identified. The anterior cerebral arteries are patent. No intracranial aneurysm is identified. Posterior circulation: The intracranial vertebral  arteries are patent. The basilar artery is patent. The intracranial vertebral arteries and basilar artery are developmentally small and there are sizable posterior communicating arteries bilaterally. The posterior cerebral arteries are patent. Hypoplastic P1 segments bilaterally. Venous sinuses: Within the limitations of contrast timing, no convincing thrombus. Anatomic variants: As described. Review of the MIP images confirms the above findings CTA head impression #1 called by telephone at the time of interpretation on 05/05/2024 at 11:37 am to provider MCNEILL [facility] , who verbally acknowledged these results. IMPRESSION: Non-contrast head CT: 1. No acute intracranial hemorrhage or acute demarcated cortical infarct. 2. Redemonstrated chronic infarcts within the right MCA vascular territory and within the cerebellum. CTA neck: 1. The common carotid and internal carotid arteries are patent within the neck. Plaque within the proximal right ICA resulting in less than 50% stenosis, progressed since the CTA of 02/08/2018. Progressive plaque within the proximal left ICA resulting in 65% stenosis. 2. Vertebral arteries patent within neck without stenosis. 3. Aortic Atherosclerosis (ICD10-I70.0). CTA head: 1. 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 2. Non-stenotic atherosclerotic plaque within the intracranial ICAs. Electronically Signed   By: Rockey Childs D.O.   On: 05/05/2024 11:47      .Critical Care  Performed by: Alva Larraine FALCON, PA-C Authorized by: Alva Larraine FALCON, PA-C   Critical care provider statement:    Critical care was necessary to treat or prevent imminent or life-threatening deterioration of the following conditions:  CNS failure or compromise   Critical care was time spent personally by me on the following activities:  Development of treatment plan with patient or surrogate, discussions with consultants, evaluation  of patient's response to treatment, obtaining history from patient or surrogate, ordering and performing treatments and interventions, ordering and review of laboratory studies, ordering and review of radiographic studies, re-evaluation of patient's condition and review of old charts   Care discussed with: admitting provider      Medications Ordered in the ED  iohexol  (OMNIPAQUE ) 350 MG/ML injection 75 mL (75 mLs Intravenous Contrast Given 05/05/24 1120)                                    Medical Decision Making Amount and/or Complexity of Data Reviewed Labs: ordered. Radiology: ordered.  Risk Decision regarding hospitalization.   Patient presents via EMS as code stroke with last known well time of 10:20 AM.  History of A-fib and is on Xarelto  but has missed multiple doses recently, most recent dose in the last 48 hours reportedly.  Patient with left-sided weakness, facial droop, dysphagia and left visual field cut as well as left-sided neglect.  Evaluated immediately upon arrival with Dr. Michaela with neurology at bedside patient taken directly to CT.  NIH stroke scale score of 13  CT Noncon of the head without evidence of intracranial hemorrhage, proceeded with CT angio which showed a right MCA large vessel occlusion.  Because of patient's recent anticoagulation  she is not a candidate for IV thrombolytics but is an excellent candidate for IR intervention especially given her good functional status prior to this event today.  Dr. Michaela has discussed with interventional radiology and they will take patient directly to the IR suite for thrombectomy and then patient will be admitted to the neuro ICU     Final diagnoses:  Ischemic stroke [facility])    ED Discharge Orders     None          Alva Larraine JULIANNA DEVONNA 05/10/24 1940    Ula Prentice SAUNDERS, MD 05/11/24 [phone]

## 2024-05-05 NOTE — ED Triage Notes (Addendum)
 Patient was at work and at [employer] noticed aphasia, left facial droop left arm weakness.  Patient has hx of stroke and has missed several days of xarelto . Patient has obvious facial droop left sided weakness field cut upon arrival.

## 2024-05-05 NOTE — Progress Notes (Signed)
 Echocardiogram 2D Echocardiogram has been performed.  [name] 05/05/2024, 5:45 PM

## 2024-05-05 NOTE — H&P (Signed)
 NEUROLOGY H&P NOTE   Date of service: May 05, 2024 Patient Name: [name] MRN:  [id number] DOB:  05-Jul-1966 Chief Complaint: CODE STROKE  History of Present Illness  [name] is a 58 y.o. 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   On exam at bridge, patient is alert and fully oriented, no gaze preference, left hemianopia, left neglect, mild expressive aphsia and dysarthira, left facial droop (chronic per chart review). 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 She denies a headache, dizziness. She states that she last took her Xarelto  on Tuesday.CBG WNL, BP 196/107.   CTH negative. CTA shows abrupt occlusion of Right M1 segment. Risks, benefits and alternatives of IVT discussed with patient and she agreed to proceed with procedure.    Last known well: 1020 Modified rankin score: 1 IV Thrombolysis: No, on Xarelto , taken within 48 hours Thrombectomy: Yes, due to right M1 occlusion  NIHSS components Score: Comment  1a Level of Conscious 0[x]  1[]  2[]  3[]      1b LOC Questions 0[x]  1[]  2[]       1c LOC Commands 0[x]  1[]  2[]       2 Best Gaze 0[x]  1[]  2[]       3 Visual 0[]  1[]  2[x]  3[]      4 Facial Palsy 0[]  1[x]  2[]  3[]     Chronic per chart review  5a Motor Arm - left 0[]  1[x]  2[]  3[]  4[]  UN[]    5b Motor Arm - Right 0[x]  1[]  2[]  3[]  4[]  UN[]    6a Motor Leg - Left 0[]  1[x]  2[]  3[]  4[]  UN[]    6b Motor Leg - Right 0[x]  1[]  2[]  3[]  4[]  UN[]    7 Limb Ataxia 0[]  1[x]  2[]  UN[]      8 Sensory 0[]  1[]  2[x]  UN[]      9 Best Language 0[]  1[x]  2[]  3[]      10 Dysarthria 0[]  1[x]  2[]  UN[]      11 Extinct. and Inattention 0[]  1[]  2[x]       TOTAL:   12      ROS  Comprehensive ROS performed and pertinent positives documented in the  HPI  Past History   Past Medical History:  Diagnosis Date   Clotting disorder (HCC)    on xarelto    Essential hypertension    Heart murmur    a. 10/2015 Echo: EF 60-65%, no rwma, mild AI/MR, sev dil LA/RA, PASP .   Noncompliance    NSVT (nonsustained ventricular tachycardia) (HCC)    a. 10/2015 during admission for CVA/AF.   Persistent atrial fibrillation (HCC)    a. CHA2DS2VASC = 4-->Xarelto ;  b. 02/2016 Successful DCCV.  c. Recurrent fib   Pneumonia 2012 x 2; 2015   Stroke [facility])    a. 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   Transient ischemic attack (TIA) 01/2013   Past Surgical History:  Procedure Laterality Date   CARDIOVERSION N/A 02/18/2013   Procedure: CARDIOVERSION;  Surgeon: Jerel Balding, MD;  Location: [facility] ENDOSCOPY;  Service: Cardiovascular;  Laterality: N/A;   CARDIOVERSION N/A 02/23/2013   Procedure: CARDIOVERSION;  Surgeon: Alm LELON Clay, MD;  Location: [facility] OR;  Service: Cardiovascular;  Laterality: N/A;  BESIDE CV  CARDIOVERSION N/A 03/12/2016   Procedure: CARDIOVERSION;  Surgeon: Ezra GORMAN Shuck, MD;  Location: [facility] ENDOSCOPY;  Service: Cardiovascular;  Laterality: N/A;   RIGHT/LEFT HEART CATH AND CORONARY ANGIOGRAPHY N/A 04/02/2021   Procedure: RIGHT/LEFT HEART CATH AND CORONARY ANGIOGRAPHY;  Surgeon: Claudene Victory ORN, MD;  Location: [facility] INVASIVE CV LAB;  Service: Cardiovascular;  Laterality: N/A;   TEE WITHOUT CARDIOVERSION N/A 02/18/2013   Procedure: TRANSESOPHAGEAL ECHOCARDIOGRAM (TEE);  Surgeon: Jerel Balding, MD;  Location: [facility] ENDOSCOPY;  Service: Cardiovascular;  Laterality: N/A;   TUBAL LIGATION  1992   Family History  Problem Relation Age of Onset   Cancer Mother    Atrial fibrillation Mother    Breast cancer Mother    Hypertension Father    Atrial fibrillation Father    Peripheral vascular disease Father    Hypertension Other    Diabetes Other    Heart attack Neg Hx     Stroke Neg Hx    Rectal cancer Neg Hx    Colon cancer Neg Hx    Esophageal cancer Neg Hx    Stomach cancer Neg Hx    Social History   Socioeconomic History   Marital status: Single    Spouse name: Not on file   Number of children: Not on file   Years of education: Not on file   Highest education level: Associate degree: occupational, Scientist, product/process development, or vocational program  Occupational History   Occupation: Multimedia programmer: [employer]   Occupation: in home health aide  Tobacco Use   Smoking status: Former    Current packs/day: 0.00    Types: Cigarettes    Start date: 03/15/2010    Quit date: 03/15/2013    Years since quitting: 11.1   Smokeless tobacco: Never  Vaping Use   Vaping status: Never Used  Substance and Sexual Activity   Alcohol use: Yes    Comment: drinks on the weekends,   2-3 beers   Drug use: No   Sexual activity: Not Currently    Birth control/protection: None  Other Topics Concern   Not on file  Social History Narrative   Not on file   Social Drivers of Health   Financial Resource Strain: Low Risk  (11/18/2023)   Overall Financial Resource Strain (CARDIA)    Difficulty of Paying Living Expenses: Not hard at all  Food Insecurity: No Food Insecurity (11/18/2023)   Hunger Vital Sign    Worried About Running Out of Food in the Last Year: Never true    Ran Out of Food in the Last Year: Never true  Transportation Needs: Unmet Transportation Needs (11/18/2023)   PRAPARE - Transportation    Lack of Transportation (Medical): Yes    Lack of Transportation (Non-Medical): Yes  Physical Activity: Unknown (11/18/2023)   Exercise Vital Sign    Days of Exercise per Week: 5 days    Minutes of Exercise per Session: Patient declined  Stress: Stress Concern Present (11/18/2023)   [employer] of Occupational Health - Occupational Stress Questionnaire    Feeling of Stress : Rather much  Social Connections: Unknown (11/18/2023)   Social Connection and Isolation  Panel    Frequency of Communication with Friends and Family: Patient declined    Frequency of Social Gatherings with Friends and Family: Patient declined    Attends Religious Services: Patient declined    Database administrator or Organizations: No    Attends Engineer, structural: Not on file    Marital Status:  Never married   Allergies  Allergen Reactions   Penicillins Hives and Other (See Comments)    Unknown  Has patient had a PCN reaction causing immediate rash, facial/tongue/throat swelling, SOB or lightheadedness with hypotension: No Has patient had a PCN reaction causing severe rash involving mucus membranes or skin necrosis: No Has patient had a PCN reaction that required hospitalization No Has patient had a PCN reaction occurring within the last 10 years: No If all of the above answers are NO, then may proceed with Cephalosporin use.    Medications   Current Facility-Administered Medications:    [START ON 05/06/2024]  stroke: early stages of recovery book, , Does not apply, Once, Judithe, Erin C, NP   0.9 %  sodium chloride  infusion, , Intravenous, Continuous, Lehner, Erin C, NP   acetaminophen  (TYLENOL ) tablet 650 mg, 650 mg, Oral, Q4H PRN **OR** acetaminophen  (TYLENOL ) 160 MG/5ML solution 650 mg, 650 mg, Per Tube, Q4H PRN **OR** acetaminophen  (TYLENOL ) suppository 650 mg, 650 mg, Rectal, Q4H PRN, Lehner, Erin C, NP   iohexol  (OMNIPAQUE ) 300 MG/ML solution 150 mL, 150 mL, Intra-arterial, Once PRN, Janjua, Rashid M, MD   iohexol  (OMNIPAQUE ) 300 MG/ML solution 50 mL, 50 mL, Intra-arterial, Once PRN, Janjua, Rashid M, MD   senna-docusate (Senokot-S) tablet 1 tablet, 1 tablet, Oral, QHS PRN, Lehner, Erin C, NP  Current Outpatient Medications:    Accu-Chek FastClix Lancets MISC, USE AS INSTRUCTED TO CHECK BLOOD SUGAR ONCE DAILY., Disp: 102 each, Rfl: 0   Blood Glucose Monitoring Suppl (ACCU-CHEK GUIDE ME) w/Device KIT, 1 kit by Does not apply route daily., Disp: 1 kit,  Rfl: 0   Continuous Glucose Receiver (FREESTYLE LIBRE 3 READER) DEVI, Apply as directed to test blood glucose, Disp: 1 each, Rfl: 0   Continuous Glucose Sensor (FREESTYLE LIBRE 3 SENSOR) MISC, Place 1 sensor on the skin every 14 days. Use to check glucose continuously, Disp: 3 each, Rfl: 11   dapagliflozin  propanediol (FARXIGA ) 10 MG TABS tablet, Take 1 tablet (10 mg total) by mouth daily., Disp: 90 tablet, Rfl: 1   digoxin  (LANOXIN ) 0.25 MG tablet, Take 1 tablet (0.25 mg total) by mouth daily., Disp: 90 tablet, Rfl: 0   furosemide  (LASIX ) 20 MG tablet, Take 2 tablets (40 mg total) by mouth daily.Pt must keep upcoming appt in May 2025 with Dr. Francyne before anymore refills. Thank you Final Attempt, Disp: 180 tablet, Rfl: 0   gabapentin  (NEURONTIN ) 300 MG capsule, Take 1 capsule (300 mg total) by mouth at bedtime., Disp: 90 capsule, Rfl: 1   glucose blood test strip, Use as instructed, Disp: 100 strip, Rfl: 0   metoprolol  (TOPROL  XL) 200 MG 24 hr tablet, Take 1 tablet (200 mg total) by mouth daily.Pt must keep upcoming appt in May 2025 with Dr. Francyne before anymore refills. Thank you Final Attempt, Disp: 90 tablet, Rfl: 0   Misc. Devices MISC, Blood pressure monitor.  Diagnosis Hypertension, Disp: 1 each, Rfl: 0   rivaroxaban  (XARELTO ) 20 MG TABS tablet, Take 1 tablet (20 mg total) by mouth daily with supper., Disp: 90 tablet, Rfl: 1   rosuvastatin  (CRESTOR ) 20 MG tablet, Take 1 tablet (20 mg total) by mouth daily., Disp: 90 tablet, Rfl: 1   sacubitril -valsartan  (ENTRESTO ) 24-26 MG, Take 1 tablet by mouth 2 (two) times daily., Disp: 180 tablet, Rfl: 0   spironolactone  (ALDACTONE ) 25 MG tablet, Take 1 tablet (25 mg total) by mouth daily., Disp: 90 tablet, Rfl: 1   topiramate  (TOPAMAX ) 25 MG tablet, Take  1 tablet (25 mg total) by mouth at bedtime., Disp: 180 tablet, Rfl: 1   Vitals   Vitals:   05/05/24 1100 05/05/24 1111 05/05/24 1112  BP:   (!) 196/70  Pulse:   (!) 113  Resp:   20  SpO2:    100%  Weight: 92.4 kg 92.1 kg   Height: 5' 4 (1.626 m) 5' 4 (1.626 m)      Body mass index is 34.84 kg/m.  Physical Exam   Constitutional: Appears well-developed and well-nourished.  Cardiovascular: Normal rate and regular rhythm.  Respiratory: Effort normal, non-labored breathing.   Neurologic Examination   Neuro: Mental Status: Patient is awake, alert, oriented to person, place, month, year, and situation. Patient is able to give a clear and coherent history. Mild expressive aphasia with mild dysarthria. Left neglect.  Cranial Nerves: II: Left hemianopia III,IV, VI: EOMI without ptosis or diploplia.  V: Facial sensation is symmetric to temperature VII: Facial movement is symmetric.  VIII: hearing is intact to voice X: Uvula elevates symmetrically XI: Shoulder shrug is symmetric. XII: tongue is midline without atrophy or fasciculations.  Motor: Tone is normal. Bulk is normal.  LUE: 4/5, mild drift LLE: 4/5, mild drift Sensory: Left-side sensory deficit Cerebellar: Unable to perform FNF or HKS with left side   Labs   CBC:  Recent Labs  Lab 05/05/24 1115 05/05/24 1117  WBC 9.3  --   NEUTROABS 5.8  --   HGB 12.4 13.9  HCT 40.7 41.0  MCV 87.3  --   PLT 180  --    Basic Metabolic Panel:  Lab Results  Component Value Date   NA 144 05/05/2024   K 3.6 05/05/2024   CO2 21 04/13/2024   GLUCOSE 110 (H) 05/05/2024   BUN 15 05/05/2024   CREATININE 1.10 (H) 05/05/2024   CALCIUM  9.9 04/13/2024   GFRNONAA >60 10/11/2022   GFRAA >60 04/10/2020   Lipid Panel:  Lab Results  Component Value Date   LDLCALC 140 (H) 04/13/2024   HgbA1c:  Lab Results  Component Value Date   HGBA1C 6.1 04/13/2024   Urine Drug Screen:     Component Value Date/Time   LABOPIA NONE DETECTED 11/02/2015 1136   COCAINSCRNUR NONE DETECTED 11/02/2015 1136   LABBENZ NONE DETECTED 11/02/2015 1136   AMPHETMU NONE DETECTED 11/02/2015 1136   THCU NONE DETECTED 11/02/2015 1136    LABBARB NONE DETECTED 11/02/2015 1136    Alcohol Level     Component Value Date/Time   ETH <10 11/21/2021 1651   INR  Lab Results  Component Value Date   INR 1.1 05/05/2024   APTT  Lab Results  Component Value Date   APTT 26 05/05/2024     CT Head without contrast(Personally reviewed): Abrupt occlusion of the right middle cerebral artery mid-to-distal M1 segment, new from the prior CTA  Non-stenotic atherosclerotic plaque within the intracranial ICAs.  CT angio Head and Neck with contrast(Personally reviewed): Abrupt occlusion of the right middle cerebral artery mid-to-distal M1 segment The common carotid and internal carotid arteries are patent within the neck. Plaque within the proximal right ICA resulting in less than 50% stenosis, progressed since the CTA of 02/08/2018. Progressive plaque within the proximal left ICA resulting in 65% stenosis.  MRI Brain(Personally reviewed): pending   Assessment   [name] is a 58 y.o. 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   On exam at bridge, patient is alert and fully oriented, no gaze preference, left hemianopia, left neglect, mild expressive aphasia and dysarthria, left facial droop (chronic per chart review). She  last took her Xarelto  on Tuesday. CTH negative. CTA shows abrupt occlusion of Right M1 segment. Risks, benefits and alternatives of IVT discussed with patient and she agreed to proceed with procedure.   Primary Diagnosis:  Cerebral infarction due to occlusion or stenosis of right middle cerebral artery. s/p mechanical thrombectomy of M1 segment with TICI2b revascularization  Secondary Diagnosis: Essential (primary) hypertension, Hypertension Emergency (SBP > 180 or DBP > 120 & end organ damage), Chronic atrial fibrillation, and  Obesity  Recommendations   R MCA Acute Ischemic Infarct - Frequent Neuro checks per stroke unit protocol - STAT CT with acute neuro change - MRI Brain stroke protocol - TTE w/bubble study - Lipid panel - Statin - will be started if LDL>70 or otherwise medically indicated - A1C - UA/UDS - Antithrombotic - hold post-IR - DVT ppx - SCDs - SBP goal - 120-160 for first 24 hours, then <180  PRN IVP Labetalol , Hydralazine   Cleviprex  gtt if needed, wean as tolerated - Avoid hypotension - NS @ 24ml/hr for 24 hours. Can DC with diet order - Telemetry monitoring for arrhythmia - 72h - CBG Q4H while NPO, then ACHS - Swallow screen - will be performed prior to PO intake - Stroke education - will be given - PT/OT/SLP - Dispo: admit to ICU for post-IR monitoring  Chronic Afib - hold antithrombotics due to high risk of bleeding post-IR procedure - hold home PO antihypertensives - PTA, on Xarelto , metoprolol , cardizem   Hx of minor concussion with post-concussion headaches - Tylenol  PRN  Stroke team will take over as attending and manage care beginning 8/22 AM  ______________________________________________________________________   Signed, Rocky JAYSON Likes, NP [facility]  I was present for the entirety of the evaluation and management reflected in the above note.  She presents with signs and symptoms consistent with her CTA finding of a right MCA stroke.  I tried to contact the three daughters listed in her contact information, but all three went to voicemail.  The patient appeared competent and that she was able to understand the process that was going on and therefore she was consented as opposed to proceeding on an emergent basis.  She expressed a desire to have the procedure after the risks and benefits were explained to her.  She apparently missed at least one dose of her Xarelto , unclear if other doses have been missed, but she definitely took it on Tuesday morning.  She will  need to be admitted for post thrombectomy monitoring to the ICU with workup as above.  This patient is critically ill and at significant risk of neurological worsening, death and care requires constant monitoring of vital signs, hemodynamics,respiratory and cardiac monitoring, neurological assessment, discussion with family, other specialists and medical decision making of high complexity. I spent 55 minutes of neurocritical care time  in the care of  this patient. This was time spent independent of any time provided by nurse practitioner or PA.  Aisha Seals, MD [facility]   If 7pm- 7am, please page neurology on call as listed in AMION. 05/05/2024  6:33 PM

## 2024-05-05 NOTE — Transfer of Care (Signed)
 Immediate Anesthesia Transfer of Care Note  Patient: [name]  Procedure(s) Performed: RADIOLOGY WITH ANESTHESIA  Patient Location: PACU  Anesthesia Type:General  Level of Consciousness: awake, alert , and oriented  Airway & Oxygen Therapy: Patient Spontanous Breathing and Patient connected to face mask oxygen  Post-op Assessment: Report given to RN and Post -op Vital signs reviewed and stable  Post vital signs: Reviewed and stable  Last Vitals:  Vitals Value Taken Time  BP 156/100 05/05/24 13:36  Temp 36.7 C 05/05/24 13:36  Pulse 93 05/05/24 13:44  Resp 27 05/05/24 13:44  SpO2 96 % 05/05/24 13:44  Vitals shown include unfiled device data.  Last Pain:  Vitals:   05/05/24 1138  PainSc: 0-No pain         Complications: No notable events documented.

## 2024-05-05 NOTE — Sedation Documentation (Signed)
 Bedside report given to RN. Femoral site assessed - Level 0, no hematoma, dressing is clean, dry, and intact. Pulses also assessed bilaterally.

## 2024-05-05 NOTE — Code Documentation (Addendum)
 Stroke Response Nurse Documentation Code Documentation  [name] is a 58 y.o. female arriving to [facility]  via [facility] on 05/05/2024 with past medical hx of stroke, Afib on Xarelto . On Xarelto  (rivaroxaban ) daily. Code stroke was activated by EMS.   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 Pt was with staff when she noted a sudden onset of these changes. Reports her last dose of Xarelto  08/19 at 19:30.   Stroke team at the bedside on patient arrival. Labs drawn and patient cleared for CT by Dr. Darra. Patient to CT with team. NIHSS 13, see documentation for details and code stroke times. Patient with decreased LOC, left hemianopia, left facial droop, left arm weakness, left leg weakness, left decreased sensation, Expressive aphasia , dysarthria , and left neglect on exam. The following imaging was completed:  CT Head and CTA. Patient is not a candidate for IV Thrombolytic due to taking Xarelto  within 48 hours and hx of hemorrhagic transformation. Patient is a candidate for IR due to right MCA M1 occlusion per Dr. Michaela.   Care Plan: Take straight to IR. Admit to ICU  Process Delays Noted: n/a  Bedside handoff with IR RN Katie.    [name]  Stroke Response RN

## 2024-05-05 NOTE — Sedation Documentation (Signed)
 Patient transported to PACU with CRNA.

## 2024-05-06 ENCOUNTER — Encounter (HOSPITAL_COMMUNITY): Payer: Self-pay | Admitting: Radiology

## 2024-05-06 DIAGNOSIS — R233 Spontaneous ecchymoses: Secondary | ICD-10-CM

## 2024-05-06 DIAGNOSIS — I69391 Dysphagia following cerebral infarction: Secondary | ICD-10-CM

## 2024-05-06 DIAGNOSIS — G936 Cerebral edema: Secondary | ICD-10-CM | POA: Diagnosis not present

## 2024-05-06 DIAGNOSIS — Z91148 Patient's other noncompliance with medication regimen for other reason: Secondary | ICD-10-CM

## 2024-05-06 DIAGNOSIS — R29714 NIHSS score 14: Secondary | ICD-10-CM | POA: Diagnosis not present

## 2024-05-06 DIAGNOSIS — I6381 Other cerebral infarction due to occlusion or stenosis of small artery: Secondary | ICD-10-CM | POA: Diagnosis not present

## 2024-05-06 DIAGNOSIS — I63411 Cerebral infarction due to embolism of right middle cerebral artery: Secondary | ICD-10-CM | POA: Diagnosis not present

## 2024-05-06 DIAGNOSIS — I4891 Unspecified atrial fibrillation: Secondary | ICD-10-CM

## 2024-05-06 DIAGNOSIS — T45516A Underdosing of anticoagulants, initial encounter: Secondary | ICD-10-CM

## 2024-05-06 DIAGNOSIS — I63421 Cerebral infarction due to embolism of right anterior cerebral artery: Secondary | ICD-10-CM | POA: Diagnosis not present

## 2024-05-06 DIAGNOSIS — E785 Hyperlipidemia, unspecified: Secondary | ICD-10-CM

## 2024-05-06 DIAGNOSIS — I611 Nontraumatic intracerebral hemorrhage in hemisphere, cortical: Secondary | ICD-10-CM | POA: Diagnosis not present

## 2024-05-06 LAB — COMPREHENSIVE METABOLIC PANEL WITH GFR
ALT: 20 U/L (ref 0–44)
AST: 22 U/L (ref 15–41)
Albumin: 3.3 g/dL — ABNORMAL LOW (ref 3.5–5.0)
Alkaline Phosphatase: 65 U/L (ref 38–126)
Anion gap: 7 (ref 5–15)
BUN: 10 mg/dL (ref 6–20)
CO2: 23 mmol/L (ref 22–32)
Calcium: 9 mg/dL (ref 8.9–10.3)
Chloride: 111 mmol/L (ref 98–111)
Creatinine, Ser: 0.87 mg/dL (ref 0.44–1.00)
GFR, Estimated: >60 mL/min (ref >60)
Glucose, Bld: 135 mg/dL — ABNORMAL HIGH (ref 70–99)
Potassium: 4 mmol/L (ref 3.5–5.1)
Sodium: 141 mmol/L (ref 135–145)
Total Bilirubin: 0.7 mg/dL (ref 0.0–1.2)
Total Protein: 7.1 g/dL (ref 6.5–8.1)

## 2024-05-06 LAB — RAPID URINE DRUG SCREEN, HOSP PERFORMED
Amphetamines: NOT DETECTED
Barbiturates: NOT DETECTED
Benzodiazepines: NOT DETECTED
Cocaine: NOT DETECTED
Opiates: NOT DETECTED
Tetrahydrocannabinol: NOT DETECTED

## 2024-05-06 LAB — LIPID PANEL
Cholesterol: 152 mg/dL (ref 0–200)
HDL: 35 mg/dL — ABNORMAL LOW (ref >40)
LDL Cholesterol: 109 mg/dL — ABNORMAL HIGH (ref 0–99)
Total CHOL/HDL Ratio: 4.3 ratio
Triglycerides: 39 mg/dL (ref <150)
VLDL: 8 mg/dL (ref 0–40)

## 2024-05-06 LAB — GLUCOSE, CAPILLARY
Glucose-Capillary: 124 mg/dL — ABNORMAL HIGH (ref 70–99)
Glucose-Capillary: 134 mg/dL — ABNORMAL HIGH (ref 70–99)
Glucose-Capillary: 134 mg/dL — ABNORMAL HIGH (ref 70–99)
Glucose-Capillary: 149 mg/dL — ABNORMAL HIGH (ref 70–99)
Glucose-Capillary: 150 mg/dL — ABNORMAL HIGH (ref 70–99)
Glucose-Capillary: 93 mg/dL (ref 70–99)

## 2024-05-06 LAB — CBC
HCT: 36 % (ref 36.0–46.0)
Hemoglobin: 11.6 g/dL — ABNORMAL LOW (ref 12.0–15.0)
MCH: 26.9 pg (ref 26.0–34.0)
MCHC: 32.2 g/dL (ref 30.0–36.0)
MCV: 83.3 fL (ref 80.0–100.0)
Platelets: 159 K/uL (ref 150–400)
RBC: 4.32 MIL/uL (ref 3.87–5.11)
RDW: 13.9 % (ref 11.5–15.5)
WBC: 14.2 K/uL — ABNORMAL HIGH (ref 4.0–10.5)
nRBC: 0 % (ref 0.0–0.2)

## 2024-05-06 LAB — PROTIME-INR
INR: 1.2 (ref 0.8–1.2)
Prothrombin Time: 15.5 s — ABNORMAL HIGH (ref 11.4–15.2)

## 2024-05-06 MED ORDER — ASPIRIN 81 MG PO CHEW
324.0000 mg | CHEWABLE_TABLET | Freq: Every day | ORAL | Status: AC
  Administered 2024-05-06 – 2024-05-07 (×2): 324 mg via ORAL
  Filled 2024-05-06 (×2): qty 4

## 2024-05-06 MED ORDER — HYDRALAZINE HCL 20 MG/ML IJ SOLN
10.0000 mg | INTRAMUSCULAR | Status: DC | PRN
  Administered 2024-05-07: 10 mg via INTRAVENOUS
  Filled 2024-05-06: qty 1

## 2024-05-06 MED ORDER — ENOXAPARIN SODIUM 40 MG/0.4ML IJ SOSY
40.0000 mg | PREFILLED_SYRINGE | INTRAMUSCULAR | Status: DC
  Administered 2024-05-06 – 2024-05-08 (×3): 40 mg via SUBCUTANEOUS
  Filled 2024-05-06 (×3): qty 0.4

## 2024-05-06 MED ORDER — LABETALOL HCL 5 MG/ML IV SOLN
20.0000 mg | INTRAVENOUS | Status: DC | PRN
  Administered 2024-05-07: 20 mg via INTRAVENOUS
  Filled 2024-05-06: qty 4

## 2024-05-06 MED ORDER — DIGOXIN 125 MCG PO TABS
0.2500 mg | ORAL_TABLET | Freq: Every day | ORAL | Status: DC
Start: 2024-05-06 — End: 2024-05-09
  Administered 2024-05-06 – 2024-05-09 (×4): 0.25 mg via ORAL
  Filled 2024-05-06 (×2): qty 2
  Filled 2024-05-06: qty 1
  Filled 2024-05-06: qty 2

## 2024-05-06 MED ORDER — METOPROLOL TARTRATE 50 MG PO TABS
50.0000 mg | ORAL_TABLET | Freq: Two times a day (BID) | ORAL | Status: DC
  Administered 2024-05-06 – 2024-05-09 (×6): 50 mg via ORAL
  Filled 2024-05-06 (×7): qty 1

## 2024-05-06 MED ORDER — SPIRONOLACTONE 25 MG PO TABS
25.0000 mg | ORAL_TABLET | Freq: Every day | ORAL | Status: DC
  Administered 2024-05-06 – 2024-05-09 (×4): 25 mg via ORAL
  Filled 2024-05-06 (×4): qty 1

## 2024-05-06 MED ORDER — INSULIN ASPART 100 UNIT/ML IJ SOLN
0.0000 [IU] | Freq: Three times a day (TID) | INTRAMUSCULAR | Status: DC
  Administered 2024-05-06 – 2024-05-09 (×2): 2 [IU] via SUBCUTANEOUS

## 2024-05-06 NOTE — PMR Pre-admission (Signed)
 PMR Admission Coordinator Pre-Admission Assessment  Patient: [name] is an 58 y.o., female MRN: [id number] DOB: May 13, 1966 Height: 5' 4 (162.6 cm) Weight: 92.1 kg  Insurance Information HMO:     PPO:      PCP:      IPA:      80/20:      OTHER:  PRIMARY: [place] Medicaid [employer] [facility]      Policy#: [id number]      Subscriber: pt CM Name: Cacilie Glasgaw-LeBatard      Phone#: [phone]     Fax#: [phone] or email [email or web address] Pre-Cert#: [id number] approved for 7 days 8//25 until 8/31 with initial review due on day 6. Please call and confirm date of admit    Employer:  Benefits:  Phone #: [phone]     Name: 8/22 Eff. Date: 03/15/24 until 12/13/24     Deduct: none      Out of Pocket Max: none      Life Max: none CIR: 100% per medicaid      SNF: 100% first 90 days covered by [facility] Medicaid then members move to [facility] Outpatient: 100%      Co-Pay: 27 visits per year combined Home Health: 100%      Co-Pay: HHA services must be limited to 100 total visits per year DME: 100%     Co-Pay: per medical neccesity Providers: in network  SECONDARY: none      Policy#:      Phone#:   Artist:       Phone#:   The Data processing manager" for patients in Inpatient Rehabilitation Facilities with attached "Privacy Act Statement-Health Care Records" was provided and verbally reviewed with: Patient and Family  Emergency Contact Information Contact Information     Name Relation Home Work Mobile   [place] Daughter   [phone]   Rowley,[name] Daughter   [phone]   Duty,Latoya Daughter   [phone]      Other Contacts   None on File    Current Medical History  Patient Admitting Diagnosis: CVA  History of Present Illness: 58 year old female with history of R posterior MCA embolic CVA 2019, HTN, Type 2 DM, nonischemic cardiomyopathy and combined HFrEF, r tempo parietal lobe with upper M2 cutoff 2017, afib, on Xarelto  who  presented on 05/05/24 with acute onset of left sided weakness while at work. Last taken Xarelto  on 2/19.   CTH negative. CTA showed abrupt occlusion of right M! Segment . S/p mechanical thrombectomy on 8/21 with TICI2b revascularization.  Etiology felt embolic in the setting of afib on Xarelto . MRI acute right MCA territory infarcts with petechial hemorrhage. 2 d echo EF 60 to 65 %. VTE prophylaxis with lovenox . Xarelto  daily pta, now on ASA for 2-3 days and then Xarelto .    Home meds GDMT, Farxiga , digoxin , Lasix , Entresto , Aldactone  and reported noncompliance with meds. Crestor  pta with LDL 109. To resume. Hgb A1c 6.1. CBGS and SSI. Recommend close f/u with PCP for better DM control.   FEES complete on 8/22. Recommended D 2 diet with thin liquids, whole meds with puree.   Complete NIHSS TOTAL: 13  Patient's medical record from [facility] has been reviewed by the rehabilitation admission coordinator and physician.  Past Medical History  Past Medical History:  Diagnosis Date   Clotting disorder (HCC)    on xarelto    Essential hypertension    Heart murmur    a. 10/2015 Echo: EF 60-65%, no rwma, mild AI/MR, sev dil LA/RA, PASP  .   Noncompliance    NSVT (nonsustained ventricular tachycardia) (HCC)    a. 10/2015 during admission for CVA/AF.   Persistent atrial fibrillation (HCC)    a. CHA2DS2VASC = 4-->Xarelto ;  b. 02/2016 Successful DCCV.  c. Recurrent fib   Pneumonia 2012 x 2; 2015   Stroke [facility])    a. 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   Transient ischemic attack (TIA) 01/2013   Has the patient had major surgery during 100 days prior to admission? Yes  Family History   family history includes Atrial fibrillation in her father and mother; Breast cancer in her mother; Cancer in her mother; Diabetes in an other family member; Hypertension in her father and another family member;  Peripheral vascular disease in her father.  Current Medications  Current Facility-Administered Medications:    acetaminophen  (TYLENOL ) tablet 650 mg, 650 mg, Oral, Q4H PRN **OR** acetaminophen  (TYLENOL ) 160 MG/5ML solution 650 mg, 650 mg, Per Tube, Q4H PRN, 650 mg at 05/05/24 1819 **OR** acetaminophen  (TYLENOL ) suppository 650 mg, 650 mg, Rectal, Q4H PRN, Lehner, Erin C, NP   aspirin  chewable tablet 324 mg, 324 mg, Oral, Daily, Shafer, Devon, NP, 324 mg at 05/06/24 1333   Chlorhexidine  Gluconate Cloth 2 % PADS 6 each, 6 each, Topical, Daily, Janjua, Rashid M, MD, 6 each at 05/06/24 1512   digoxin  (LANOXIN ) tablet 0.25 mg, 0.25 mg, Oral, Daily, Merilee Linsey I, RPH, 0.25 mg at 05/06/24 1102   enoxaparin  (LOVENOX ) injection 40 mg, 40 mg, Subcutaneous, Q24H, Shafer, Devon, NP, 40 mg at 05/06/24 1228   hydrALAZINE  (APRESOLINE ) injection 10 mg, 10 mg, Intravenous, Q4H PRN, Jerri Pfeiffer, MD   insulin  aspart (novoLOG ) injection 0-15 Units, 0-15 Units, Subcutaneous, Q4H, Kirkpatrick, McNeill P, MD, 2 Units at 05/06/24 1228   labetalol  (NORMODYNE ) injection 20 mg, 20 mg, Intravenous, Q4H PRN, Jerri Pfeiffer, MD   metoprolol  tartrate (LOPRESSOR ) tablet 50 mg, 50 mg, Oral, BID, Shafer, Devon, NP, 50 mg at 05/06/24 1102   Oral care mouth rinse, 15 mL, Mouth Rinse, 4 times per day, Janjua, Rashid M, MD, 15 mL at 05/06/24 1228   Oral care mouth rinse, 15 mL, Mouth Rinse, PRN, Janjua, Rashid M, MD   rosuvastatin  (CRESTOR ) tablet 20 mg, 20 mg, Oral, q1800, Michaela Aisha SQUIBB, MD, 20 mg at 05/05/24 2250   senna-docusate (Senokot-S) tablet 1 tablet, 1 tablet, Oral, QHS PRN, Lehner, Erin C, NP   spironolactone  (ALDACTONE ) tablet 25 mg, 25 mg, Oral, Daily, Shafer, Devon, NP, 25 mg at 05/06/24 1102  Patients Current Diet:  Diet Order             DIET DYS 2 Room service appropriate? Yes with Assist; Fluid consistency: Thin  Diet effective now                  Precautions /  Restrictions Precautions Precautions: Fall Restrictions Weight Bearing Restrictions Per Provider Order: No   Has the patient had 2 or more falls or a fall with injury in the past year? No  Prior Activity Level Community (5-7x/wk): independent, works in [employer], drives  Prior Functional Level Self Care: Did the patient need help bathing, dressing, using the toilet or eating? Independent  Indoor Mobility: Did the patient need assistance with walking from room to room (with or without device)? Independent  Stairs: Did the patient need assistance with internal or external stairs (with or without device)? Independent  Functional Cognition: Did the  patient need help planning regular tasks such as shopping or remembering to take medications? Independent  Patient Information Are you of Hispanic, Latino/a,or Spanish origin?: A. No, not of Hispanic, Latino/a, or Spanish origin What is your race?: B. Black or African American Do you need or want an interpreter to communicate with a doctor or health care staff?: 0. No  Patient's Response To:  Health Literacy and Transportation Is the patient able to respond to health literacy and transportation needs?: Yes Health Literacy - How often do you need to have someone help you when you read instructions, pamphlets, or other written material from your doctor or pharmacy?: Never In the past 12 months, has lack of transportation kept you from medical appointments or from getting medications?: No In the past 12 months, has lack of transportation kept you from meetings, work, or from getting things needed for daily living?: No  Home Assistive Devices / Equipment Home Equipment: None  Prior Device Use: Indicate devices/aids used by the patient prior to current illness, exacerbation or injury? None of the above  Current Functional Level Cognition  Arousal/Alertness: Awake/alert Overall Cognitive Status: Impaired/Different from  baseline Orientation Level: Oriented X4 Attention: Focused, Sustained Focused Attention: Appears intact Sustained Attention: Impaired Sustained Attention Impairment: Verbal basic, Functional basic Memory: Appears intact Awareness: Impaired Awareness Impairment: Emergent impairment, Anticipatory impairment Problem Solving: Impaired Problem Solving Impairment: Functional basic Executive Function: Initiating Behaviors: Other (comment) (flat affect) Safety/Judgment: Impaired    Extremity Assessment (includes Sensation/Coordination)  Upper Extremity Assessment: Right hand dominant, LUE deficits/detail LUE Deficits / Details: Flaccid to hand, wrist and forearm.  Trace bicep, no tricep activation noted.  Increased tone noted throughout LUE.  Swelling to L hand LUE Sensation: WNL LUE Coordination: decreased fine motor, decreased gross motor  Lower Extremity Assessment: Defer to PT evaluation LLE Deficits / Details: 1/5 knee flexion/extension, 0/5 hip abd/add, 2/5 hip flex/ext functionally. Hypertonicity    ADLs  Overall ADL's : Needs assistance/impaired Eating/Feeding: Moderate assistance, Sitting Grooming: Minimal assistance, Sitting Upper Body Bathing: Moderate assistance, Sitting Lower Body Bathing: Maximal assistance, Bed level Upper Body Dressing : Maximal assistance, Standing Lower Body Dressing: Maximal assistance, Bed level Toilet Transfer: Maximal assistance, Squat-pivot, BSC/3in1 Toileting- Clothing Manipulation and Hygiene: Total assistance, Sit to/from stand    Mobility  Overal bed mobility: Needs Assistance Bed Mobility: Supine to Sit Supine to sit: +2 for physical assistance, Mod assist General bed mobility comments: up in the recliner    Transfers  Overall transfer level: Needs assistance Equipment used: 2 person hand held assist Transfers: Sit to/from Stand, Bed to chair/wheelchair/BSC Sit to Stand: Mod assist, +2 physical assistance Bed to/from  chair/wheelchair/BSC transfer type:: Stand pivot Stand pivot transfers: Mod assist, +2 physical assistance General transfer comment: assist for power up, rise, steady. Stand x2 from EOB, stand pivot towards R requiring max L knee blocking during RLE stepping.    Ambulation / Gait / Stairs / Psychologist, prison and probation services  Ambulation/Gait General Gait Details: nt    Posture / Balance Dynamic Sitting Balance Sitting balance - Comments: Heavy L lateral bias, tactile and verbal cuing to correct Balance Overall balance assessment: Needs assistance Sitting-balance support: Feet supported, Single extremity supported Sitting balance-Leahy Scale: Poor Sitting balance - Comments: Heavy L lateral bias, tactile and verbal cuing to correct Postural control: Left lateral lean Standing balance support: Bilateral upper extremity supported, During functional activity Standing balance-Leahy Scale: Poor Standing balance comment: reliant on +2    Special considerations/life events  Reports noncompliance with meds Hgb  A1c 6.1   Previous Home Environment  Living Arrangements:  (lives with daughter)  Lives With: Daughter Available Help at Discharge: Family, Available 24 hours/day (Has 7 children who can make schedules to provide care) Type of Home: House Home Layout: One level Home Access: Stairs to enter Entrance Stairs-Rails: None Entrance Stairs-Number of Steps: 3 Bathroom Shower/Tub: Associate Professor: Yes How Accessible: Accessible via walker Home Care Services: No  Discharge Living Setting Plans for Discharge Living Setting: Patient's home, Lives with (comment) (daughter) Type of Home at Discharge: House Discharge Home Layout: One level Discharge Home Access: Stairs to enter Entrance Stairs-Rails: None Entrance Stairs-Number of Steps: 3 Discharge Bathroom Shower/Tub: Tub/shower unit Discharge Bathroom Toilet: Standard Discharge Bathroom Accessibility:  Yes How Accessible: Accessible via walker Does the patient have any problems obtaining your medications?: No  Social/Family/Support Systems Patient Roles: Parent (employee) Contact Information: Hospital doctor, daughter that lives together Anticipated Caregiver: children, Hospital doctor, is a Careers information officer and can take time off Anticipated Industrial/product designer Information: see contacts Ability/Limitations of Caregiver: family can make schedules to provide care Caregiver Availability: 24/7 Discharge Plan Discussed with Primary Caregiver: Yes Is Caregiver In Agreement with Plan?: Yes Does Caregiver/Family have Issues with Lodging/Transportation while Pt is in Rehab?: No  Goals Patient/Family Goal for Rehab: min assist with PT, OT and  supervision with SLP Expected length of stay: ELOS 14 to 20 days Additional Information: I have discussed need for ramp at home on 8/22. Patient discussed needing rails at home at entry Pt/Family Agrees to Admission and willing to participate: Yes Program Orientation Provided & Reviewed with Pt/Caregiver Including Roles  & Responsibilities: Yes  Decrease burden of Care through IP rehab admission: n/a  Possible need for SNF placement upon discharge: not anticipated  Patient Condition: I have reviewed medical records from [facility], spoken with  patient and daughter. I met with patient at the bedside for inpatient rehabilitation assessment.  Patient will benefit from ongoing PT, OT, and SLP, can actively participate in 3 hours of therapy a day 5 days of the week, and can make measurable gains during the admission.  Patient will also benefit from the coordinated team approach during an Inpatient Acute Rehabilitation admission.  The patient will receive intensive therapy as well as Rehabilitation physician, nursing, social worker, and care management interventions.  Due to bladder management, bowel management, safety, skin/wound care, disease management, medication  administration, pain management, and patient education the patient requires 24 hour a day rehabilitation nursing.  The patient is currently mod A with mobility and basic ADLs.  Discharge setting and therapy post discharge at home with home health is anticipated.  Patient has agreed to participate in the Acute Inpatient Rehabilitation Program and will admit today.  Preadmission Screen Completed By:  Alison Heron Lot, RN MSN 05/06/2024 5:11 PM ______________________________________________________________________   Discussed status with Dr. Urbano  on 05/09/24 at 900 and received approval for admission today.  Admission Coordinator:  Alison Heron Lot, RN MSN time 1026/Date 05/09/24  with updates by Leita Kleine, MS, CCC-SLP   Assessment/Plan: Diagnosis: CVA Does the need for close, 24 hr/day Medical supervision in concert with the patient's rehab needs make it unreasonable for this patient to be served in a less intensive setting? Yes Co-Morbidities requiring supervision/potential complications: A fib, [place]oke HTN, cardiomyopathy, HFrEF, HLD, dysphagia, Obesity, leukocytosis  Due to bladder management, bowel management, safety, skin/wound care, disease management, medication administration, pain management, and patient education, does the patient require 24 hr/day  rehab nursing? Yes Does the patient require coordinated care of a physician, rehab nurse, PT, OT, and SLP to address physical and functional deficits in the context of the above medical diagnosis(es)? Yes Addressing deficits in the following areas: balance, endurance, locomotion, strength, transferring, bowel/bladder control, bathing, dressing, feeding, grooming, toileting, cognition, speech, language, swallowing, and psychosocial support Can the patient actively participate in an intensive therapy program of at least 3 hrs of therapy 5 days a week? Yes The potential for patient to make measurable gains while on inpatient rehab  is excellent Anticipated functional outcomes upon discharge from inpatient rehab: min assist PT, min assist OT, supervision SLP Estimated rehab length of stay to reach the above functional goals is: 14-20 Anticipated discharge destination: Home 10. Overall Rehab/Functional Prognosis: good   MD Signature: Murray Collier

## 2024-05-06 NOTE — Evaluation (Signed)
 Physical Therapy Evaluation Patient Details Name: [name] MRN: [id number] DOB: 06-02-1966 Today's Date: 05/06/2024  History of Present Illness  58 yo female arrives to [facility] ED on 05/05/24 for L sided weakness and L hemianopia. CTA shows R MCA M1 occlusion. S/p mechnical thrombectomy. PMH: stroke (2019), afib.  Clinical Impression  Pt presents with L weakness with hypertonicity, L inattention with documented L hemianopia, impaired balance and perception of midline, and max difficulty mobilizing. Pt to benefit from acute PT to address deficits. Pt requiring mod-max +2 assist for stand pivot OOB to recliner, heavy assist needed for L lateral bias and LLE management. Patient will benefit from intensive inpatient follow-up therapy, >3 hours/day. PT to progress mobility as tolerated, and will continue to follow acutely.          If plan is discharge home, recommend the following: A lot of help with walking and/or transfers;A lot of help with bathing/dressing/bathroom   Can travel by private vehicle        Equipment Recommendations None recommended by PT;Other (comment) (tbd)  Recommendations for Other Services       Functional Status Assessment Patient has had a recent decline in their functional status and demonstrates the ability to make significant improvements in function in a reasonable and predictable amount of time.     Precautions / Restrictions Precautions Precautions: Fall Restrictions Weight Bearing Restrictions Per Provider Order: No      Mobility  Bed Mobility Overal bed mobility: Needs Assistance Bed Mobility: Supine to Sit     Supine to sit: +2 for physical assistance, Mod assist     General bed mobility comments: assist for LE progression to EOB, trunk elevation via R HHA, scooting towards EOB with assist of bed pad    Transfers Overall transfer level: Needs assistance Equipment used: 2 person hand held assist Transfers: Sit to/from Stand, Bed to  chair/wheelchair/BSC Sit to Stand: Mod assist, +2 physical assistance Stand pivot transfers: Mod assist, +2 physical assistance         General transfer comment: assist for power up, rise, steady. Stand x2 from EOB, stand pivot towards R requiring max L knee blocking during RLE stepping.    Ambulation/Gait               General Gait Details: nt  Acupuncturist Bed    Modified Rankin (Stroke Patients Only) Modified Rankin (Stroke Patients Only) Pre-Morbid Rankin Score: No symptoms Modified Rankin: Severe disability     Balance Overall balance assessment: Needs assistance Sitting-balance support: Feet supported, Single extremity supported Sitting balance-Leahy Scale: Poor Sitting balance - Comments: Heavy L lateral bias, tactile and verbal cuing to correct Postural control: Left lateral lean Standing balance support: Bilateral upper extremity supported, During functional activity Standing balance-Leahy Scale: Poor Standing balance comment: reliant on +2                             Pertinent Vitals/Pain Pain Assessment Pain Assessment: Faces Faces Pain Scale: No hurt Pain Intervention(s): Limited activity within patient's tolerance, Monitored during session, Repositioned    Home Living Family/patient expects to be discharged to:: Private residence Living Arrangements: Children Available Help at Discharge: Family;Other (Comment) (Lives with daughter who is a Public relations account executive. Daughter states she can take off work if needed for pt's recovery, also have other family in the area that  could assist once d/c) Type of Home: House Home Access: Stairs to enter Entrance Stairs-Rails: None Entrance Stairs-Number of Steps: 3   Home Layout: One level Home Equipment: None      Prior Function Prior Level of Function : Independent/Modified Independent;Driving;Working/employed             Mobility Comments: indep,  pt states she had no deficits from previous CVAs ADLs Comments: 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, indep with ADLs     Extremity/Trunk Assessment   Upper Extremity Assessment Upper Extremity Assessment: Defer to OT evaluation    Lower Extremity Assessment Lower Extremity Assessment: LLE deficits/detail LLE Deficits / Details: 1/5 knee flexion/extension, 0/5 hip abd/add, 2/5 hip flex/ext functionally. Hypertonicity    Cervical / Trunk Assessment Cervical / Trunk Assessment: Other exceptions Cervical / Trunk Exceptions: heavy L lateral bias in upright sitting, impaired perception of midline  Communication   Communication Communication: No apparent difficulties    Cognition Arousal: Alert Behavior During Therapy: Flat affect   PT - Cognitive impairments: Awareness, Problem solving, Safety/Judgement, Sequencing                       PT - Cognition Comments: no awareness of deficits, states she feels back to baseline but has significant L sided weakness and inattention. max sequencing cues Following commands: Impaired Following commands impaired: Follows one step commands with increased time     Cueing Cueing Techniques: Verbal cues, Gestural cues     General Comments General comments (skin integrity, edema, etc.): BP 151/100, HR in afib rhythm 110-131. L inattention with eyes not crossing midline towards L, does attend to L with verbal cuing    Exercises     Assessment/Plan    PT Assessment Patient needs continued PT services  PT Problem List Decreased strength;Decreased mobility;Decreased activity tolerance;Decreased balance;Decreased knowledge of use of DME;Pain;Decreased safety awareness;Decreased cognition;Impaired tone       PT Treatment Interventions Therapeutic activities;DME instruction;Gait training;Therapeutic exercise;Patient/family education;Balance training;Stair training;Functional mobility training;Neuromuscular  re-education    PT Goals (Current goals can be found in the Care Plan section)  Acute Rehab PT Goals PT Goal Formulation: With patient Time For Goal Achievement: 05/20/24 Potential to Achieve Goals: Good    Frequency Min 3X/week     Co-evaluation               AM-PAC PT 6 Clicks Mobility  Outcome Measure Help needed turning from your back to your side while in a flat bed without using bedrails?: A Lot Help needed moving from lying on your back to sitting on the side of a flat bed without using bedrails?: A Lot Help needed moving to and from a bed to a chair (including a wheelchair)?: Total Help needed standing up from a chair using your arms (e.g., wheelchair or bedside chair)?: Total Help needed to walk in hospital room?: Total Help needed climbing 3-5 steps with a railing? : Total 6 Click Score: 8    End of Session Equipment Utilized During Treatment: Gait belt Activity Tolerance: Patient tolerated treatment well;Patient limited by fatigue Patient left: in chair;with call bell/phone within reach;with chair alarm set Nurse Communication: Mobility status PT Visit Diagnosis: Other abnormalities of gait and mobility (R26.89);Muscle weakness (generalized) (M62.81)    Time: 9054-8985 PT Time Calculation (min) (ACUTE ONLY): 29 min   Charges:   PT Evaluation $PT Eval Low Complexity: 1 Low PT Treatments $Therapeutic Activity: 8-22 mins PT General Charges $$ ACUTE  PT VISIT: 1 Visit         Franklin Clapsaddle S, PT DPT Acute Rehabilitation Services Secure Chat Preferred  Office [phone]   [name] 05/06/2024, 10:31 AM

## 2024-05-06 NOTE — Procedures (Signed)
 Objective Swallowing Evaluation: Type of Study: FEES-Fiberoptic Endoscopic Evaluation of Swallow   Patient Details  Name: [name] MRN: [id number] Date of Birth: 20-Mar-1966  Today's Date: 05/06/2024 Time: SLP Start Time (ACUTE ONLY): 1225 -SLP Stop Time (ACUTE ONLY): 1258  SLP Time Calculation (min) (ACUTE ONLY): 33 min   Past Medical History:  Past Medical History:  Diagnosis Date   Clotting disorder (HCC)    on xarelto    Essential hypertension    Heart murmur    a. 10/2015 Echo: EF 60-65%, no rwma, mild AI/MR, sev dil LA/RA, PASP .   Noncompliance    NSVT (nonsustained ventricular tachycardia) (HCC)    a. 10/2015 during admission for CVA/AF.   Persistent atrial fibrillation (HCC)    a. CHA2DS2VASC = 4-->Xarelto ;  b. 02/2016 Successful DCCV.  c. Recurrent fib   Pneumonia 2012 x 2; 2015   Stroke [facility])    a. 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   Transient ischemic attack (TIA) 01/2013   Past Surgical History:  Past Surgical History:  Procedure Laterality Date   CARDIOVERSION N/A 02/18/2013   Procedure: CARDIOVERSION;  Surgeon: Jerel Balding, MD;  Location: [facility] ENDOSCOPY;  Service: Cardiovascular;  Laterality: N/A;   CARDIOVERSION N/A 02/23/2013   Procedure: CARDIOVERSION;  Surgeon: Alm LELON Clay, MD;  Location: [facility] OR;  Service: Cardiovascular;  Laterality: N/A;  BESIDE CV   CARDIOVERSION N/A 03/12/2016   Procedure: CARDIOVERSION;  Surgeon: Ezra GORMAN Shuck, MD;  Location: [facility] ENDOSCOPY;  Service: Cardiovascular;  Laterality: N/A;   RADIOLOGY WITH ANESTHESIA N/A 05/05/2024   Procedure: RADIOLOGY WITH ANESTHESIA;  Surgeon: Radiologist, Medication, MD;  Location: [facility] OR;  Service: Radiology;  Laterality: N/A;   RIGHT/LEFT HEART CATH AND CORONARY ANGIOGRAPHY N/A 04/02/2021   Procedure: RIGHT/LEFT HEART CATH AND CORONARY ANGIOGRAPHY;  Surgeon: Claudene Victory LELON, MD;  Location: [facility] INVASIVE CV LAB;   Service: Cardiovascular;  Laterality: N/A;   TEE WITHOUT CARDIOVERSION N/A 02/18/2013   Procedure: TRANSESOPHAGEAL ECHOCARDIOGRAM (TEE);  Surgeon: Jerel Balding, MD;  Location: [facility] ENDOSCOPY;  Service: Cardiovascular;  Laterality: N/A;   TUBAL LIGATION  1992   HPI: [name] is a 58 y.o. female CODE STROKE after acute onset of left-sided weakness while at work. Underwent thrombectomy and brief intubation on 8/21. MRI shows Acute right MCA territory infarcts with petechial hemorrhage, as  described above. No mass effect. Pt with hx of R posterior MCA embolic stroke 2019, R temporoparietal lobe with upper M2 cutoff 2017 (WFL on Cognistat), Afib, on Xarelto    No data recorded   Assessment / Plan / Recommendation     05/06/2024    2:00 PM  Clinical Impressions  Clinical Impression Pt presents with a mild oropharyngeal dysphagia. Decreased ora cohesion of liquid and solid boluses with premature spillage and anterior spillage on the left. Mild oral residue. Liquids and solids pool as deep as pyriforms due to oral deficits and likely a slight delay in swallowing. Airway protection and pharyngeal strength very good and sustained with consecutive swallows. Pt has mild erythema on vocal folds and anterior tracheal wall that doesnt impact function. Pt may have a hypersensitive cough. Pt ok to initiate a dys 2 (minced and moist) diet with thin liquids. Can upgrade solids further if diet well tolerated without pocketing or excessive coughing.  SLP Visit Diagnosis Dysphagia, oropharyngeal phase (R13.12)  Attention and concentration deficit following --  Frontal lobe and executive function deficit following --  Impact on safety and function Mild aspiration risk         05/06/2024    2:00 PM  Treatment Recommendations  Treatment Recommendations Therapy as outlined in treatment plan below        05/06/2024    2:00 PM  Prognosis  Prognosis for improved oropharyngeal function Good  Barriers to Reach  Goals --  Barriers/Prognosis Comment --       05/06/2024    2:00 PM  Diet Recommendations  SLP Diet Recommendations Dysphagia 2 (Fine chop) solids;Thin liquid  Liquid Administration via Straw;Cup  Medication Administration Whole meds with puree  Compensations Slow rate;Small sips/bites  Postural Changes --         05/06/2024    2:00 PM  Other Recommendations  Recommended Consults --  Oral Care Recommendations Oral care BID  Caregiver Recommendations --  Follow Up Recommendations Acute inpatient rehab (3hours/day)  Assistance recommended at discharge --  Functional Status Assessment --       05/06/2024    2:00 PM  Frequency and Duration   Speech Therapy Frequency (ACUTE ONLY) min 2x/week  Treatment Duration 2 weeks         05/06/2024    2:00 PM  Oral Phase  Oral Phase Impaired  Oral - Pudding Teaspoon --  Oral - Pudding Cup --  Oral - Honey Teaspoon --  Oral - Honey Cup --  Oral - Nectar Teaspoon --  Oral - Nectar Cup --  Oral - Nectar Straw --  Oral - Thin Teaspoon --  Oral - Thin Cup Premature spillage;Left anterior bolus loss  Oral - Thin Straw Premature spillage  Oral - Puree Decreased bolus cohesion  Oral - Mech Soft --  Oral - Regular Decreased bolus cohesion;Premature spillage;Delayed oral transit  Oral - Multi-Consistency --  Oral - Pill --  Oral Phase - Comment --       05/06/2024    2:00 PM  Pharyngeal Phase  Pharyngeal Phase Impaired  Pharyngeal- Pudding Teaspoon --  Pharyngeal --  Pharyngeal- Pudding Cup --  Pharyngeal --  Pharyngeal- Honey Teaspoon --  Pharyngeal --  Pharyngeal- Honey Cup --  Pharyngeal --  Pharyngeal- Nectar Teaspoon --  Pharyngeal --  Pharyngeal- Nectar Cup --  Pharyngeal --  Pharyngeal- Nectar Straw --  Pharyngeal --  Pharyngeal- Thin Teaspoon --  Pharyngeal --  Pharyngeal- Thin Cup Delayed swallow initiation-pyriform sinuses  Pharyngeal --  Pharyngeal- Thin Straw Delayed swallow initiation-pyriform sinuses   Pharyngeal --  Pharyngeal- Puree Delayed swallow initiation-pyriform sinuses  Pharyngeal --  Pharyngeal- Mechanical Soft Delayed swallow initiation-pyriform sinuses  Pharyngeal --  Pharyngeal- Regular --  Pharyngeal --  Pharyngeal- Multi-consistency --  Pharyngeal --  Pharyngeal- Pill --  Pharyngeal --  Pharyngeal Comment --         No data to display          Consuelo Fort, MA CCC-SLP  Acute Rehabilitation Services Secure Chat Preferred Office [phone]  [place] Fitch 05/06/2024, 3:05 PM

## 2024-05-06 NOTE — Evaluation (Signed)
 Occupational Therapy Evaluation Patient Details Name: [name] MRN: [id number] DOB: 01/14/1966 Today's Date: 05/06/2024   History of Present Illness   58 yo female arrives to [facility] ED on 05/05/24 for L sided weakness and L hemianopia. CTA shows R MCA M1 occlusion. S/p mechnical thrombectomy. PMH: stroke (2019), afib.     Clinical Impressions Patient admitted for the diagnosis above.  PTA she lived at home and remained independent with ADL,iADL, mobility, worked full time and needed no assist for community mobility.  Deficits impacting independence are listed below.  Currently she is needing up to +2 for simple transfers, Mod A for upper body ADL seated, and Max A for lower body ADL at bed level.  OT will follow in the acute setting to address deficits, and Patient will benefit from intensive inpatient follow-up therapy, >3 hours/day.     If plan is discharge home, recommend the following:   A lot of help with bathing/dressing/bathroom;Two people to help with walking and/or transfers;Assist for transportation;Assistance with cooking/housework;Help with stairs or ramp for entrance;Assistance with feeding     Functional Status Assessment   Patient has had a recent decline in their functional status and demonstrates the ability to make significant improvements in function in a reasonable and predictable amount of time.     Equipment Recommendations   BSC/3in1     Recommendations for Other Services         Precautions/Restrictions   Precautions Precautions: Fall Recall of Precautions/Restrictions: Impaired Restrictions Weight Bearing Restrictions Per Provider Order: No     Mobility Bed Mobility               General bed mobility comments: up in the recliner    Transfers Overall transfer level: Needs assistance Equipment used: 2 person hand held assist Transfers: Sit to/from Stand, Bed to chair/wheelchair/BSC Sit to Stand: Mod assist, +2 physical  assistance Stand pivot transfers: Mod assist, +2 physical assistance                Balance Overall balance assessment: Needs assistance Sitting-balance support: Feet supported, Single extremity supported Sitting balance-Leahy Scale: Poor   Postural control: Left lateral lean Standing balance support: Bilateral upper extremity supported, During functional activity Standing balance-Leahy Scale: Poor                             ADL either performed or assessed with clinical judgement   ADL Overall ADL's : Needs assistance/impaired Eating/Feeding: Moderate assistance;Sitting   Grooming: Minimal assistance;Sitting   Upper Body Bathing: Moderate assistance;Sitting   Lower Body Bathing: Maximal assistance;Bed level   Upper Body Dressing : Maximal assistance;Standing   Lower Body Dressing: Maximal assistance;Bed level   Toilet Transfer: Maximal assistance;Squat-pivot;BSC/3in1   Toileting- Clothing Manipulation and Hygiene: Total assistance;Sit to/from stand               Vision Ability to See in Adequate Light: 2 Moderately impaired Patient Visual Report: Peripheral vision impairment       Perception Perception: Impaired Preception Impairment Details: Inattention/Neglect     Praxis Praxis: WFL       Pertinent Vitals/Pain Pain Assessment Pain Assessment: No/denies pain Pain Intervention(s): Monitored during session     Extremity/Trunk Assessment Upper Extremity Assessment Upper Extremity Assessment: Right hand dominant;LUE deficits/detail LUE Deficits / Details: Flaccid to hand, wrist and forearm.  Trace bicep, no tricep activation noted.  Increased tone noted throughout LUE.  Swelling to L hand LUE Sensation:  WNL LUE Coordination: decreased fine motor;decreased gross motor   Lower Extremity Assessment Lower Extremity Assessment: Defer to PT evaluation LLE Deficits / Details: 1/5 knee flexion/extension, 0/5 hip abd/add, 2/5 hip flex/ext  functionally. Hypertonicity   Cervical / Trunk Assessment Cervical / Trunk Assessment: Other exceptions Cervical / Trunk Exceptions: heavy L lateral bias in upright sitting, impaired perception of midline   Communication Communication Communication: No apparent difficulties   Cognition Arousal: Alert Behavior During Therapy: Flat affect Cognition: Cognition impaired   Orientation impairments: Situation     Attention impairment (select first level of impairment): Sustained attention Executive functioning impairment (select all impairments): Organization                   Following commands: Impaired Following commands impaired: Follows one step commands with increased time     Cueing  General Comments   Cueing Techniques: Verbal cues;Gestural cues  L inattention with eyes not crossing midline towards L, does attend to L with verbal cuing   Exercises     Shoulder Instructions      Home Living Family/patient expects to be discharged to:: Private residence Living Arrangements: Children Available Help at Discharge: Family Type of Home: House Home Access: Stairs to enter Secretary/administrator of Steps: 3 Entrance Stairs-Rails: None Home Layout: One level     Bathroom Shower/Tub: Chief Strategy Officer: Standard     Home Equipment: None          Prior Functioning/Environment Prior Level of Function : Independent/Modified Independent;Driving;Working/employed             Mobility Comments: indep, pt states she had no deficits from previous CVAs ADLs Comments: 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, indep with ADLs    OT Problem List: Decreased strength;Decreased range of motion;Decreased activity tolerance;Impaired balance (sitting and/or standing);Impaired vision/perception;Decreased knowledge of precautions;Decreased safety awareness;Increased edema;Impaired tone   OT Treatment/Interventions: Self-care/ADL  training;Therapeutic activities;Neuromuscular education;Visual/perceptual remediation/compensation;DME and/or AE instruction;Patient/family education;Balance training      OT Goals(Current goals can be found in the care plan section)   Acute Rehab OT Goals Patient Stated Goal: Return home OT Goal Formulation: With patient Time For Goal Achievement: 05/20/24 Potential to Achieve Goals: Good ADL Goals Pt Will Perform Eating: with set-up;sitting Pt Will Perform Grooming: with set-up;sitting Pt Will Perform Upper Body Bathing: with min assist;sitting Pt Will Perform Upper Body Dressing: with min assist;sitting Pt Will Transfer to Toilet: with min assist;stand pivot transfer;bedside commode   OT Frequency:  Min 2X/week    Co-evaluation              AM-PAC OT 6 Clicks Daily Activity     Outcome Measure Help from another person eating meals?: A Lot Help from another person taking care of personal grooming?: A Lot Help from another person toileting, which includes using toliet, bedpan, or urinal?: A Lot Help from another person bathing (including washing, rinsing, drying)?: A Lot Help from another person to put on and taking off regular upper body clothing?: A Lot Help from another person to put on and taking off regular lower body clothing?: A Lot 6 Click Score: 12   End of Session Equipment Utilized During Treatment: Gait belt Nurse Communication: Mobility status  Activity Tolerance: Patient tolerated treatment well Patient left: in chair;with call bell/phone within reach;with chair alarm set;with family/visitor present  OT Visit Diagnosis: Unsteadiness on feet (R26.81);Muscle weakness (generalized) (M62.81);Hemiplegia and hemiparesis;Low vision, both eyes (H54.2) Hemiplegia - Right/Left: Left Hemiplegia -  dominant/non-dominant: Non-Dominant Hemiplegia - caused by: Cerebral infarction                Time: 8696-8674 OT Time Calculation (min): 22 min Charges:  OT General  Charges $OT Visit: 1 Visit OT Evaluation $OT Eval Moderate Complexity: 1 Mod  05/06/2024  RP, OTR/L  Acute Rehabilitation Services  Office:  [phone]   Charlie JONETTA Halsted 05/06/2024, 1:41 PM

## 2024-05-06 NOTE — Addendum Note (Signed)
 Addendum  created 05/06/24 1210 by Merla Almarie HERO, DO   Intraprocedure Staff edited

## 2024-05-06 NOTE — Evaluation (Signed)
 Clinical/Bedside Swallow Evaluation Patient Details  Name: [name] MRN: [id number] Date of Birth: [birth year]  Today's Date: 05/06/2024 Time: SLP Start Time (ACUTE ONLY): 1014 SLP Stop Time (ACUTE ONLY): 1040 SLP Time Calculation (min) (ACUTE ONLY): 26 min  Past Medical History:  Past Medical History:  Diagnosis Date   Clotting disorder (HCC)    on xarelto    Essential hypertension    Heart murmur    a. 10/2015 Echo: EF 60-65%, no rwma, mild AI/MR, sev dil LA/RA, PASP .   Noncompliance    NSVT (nonsustained ventricular tachycardia) (HCC)    a. 10/2015 during admission for CVA/AF.   Persistent atrial fibrillation (HCC)    a. CHA2DS2VASC = 4-->Xarelto ;  b. 02/2016 Successful DCCV.  c. Recurrent fib   Pneumonia 2012 x 2; 2015   Stroke [facility])    a. 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   Transient ischemic attack (TIA) 01/2013   Past Surgical History:  Past Surgical History:  Procedure Laterality Date   CARDIOVERSION N/A 02/18/2013   Procedure: CARDIOVERSION;  Surgeon: Jerel Balding, MD;  Location: [facility] ENDOSCOPY;  Service: Cardiovascular;  Laterality: N/A;   CARDIOVERSION N/A 02/23/2013   Procedure: CARDIOVERSION;  Surgeon: Alm LELON Clay, MD;  Location: [facility] OR;  Service: Cardiovascular;  Laterality: N/A;  BESIDE CV   CARDIOVERSION N/A 03/12/2016   Procedure: CARDIOVERSION;  Surgeon: Ezra GORMAN Shuck, MD;  Location: [facility] ENDOSCOPY;  Service: Cardiovascular;  Laterality: N/A;   RIGHT/LEFT HEART CATH AND CORONARY ANGIOGRAPHY N/A 04/02/2021   Procedure: RIGHT/LEFT HEART CATH AND CORONARY ANGIOGRAPHY;  Surgeon: Claudene Victory LELON, MD;  Location: [facility] INVASIVE CV LAB;  Service: Cardiovascular;  Laterality: N/A;   TEE WITHOUT CARDIOVERSION N/A 02/18/2013   Procedure: TRANSESOPHAGEAL ECHOCARDIOGRAM (TEE);  Surgeon: Jerel Balding, MD;  Location: [facility] ENDOSCOPY;  Service: Cardiovascular;  Laterality: N/A;   TUBAL  LIGATION  1992   HPI:  [name] is a 58 y.o. female CODE STROKE after acute onset of left-sided weakness while at work. Underwent thrombectomy and brief intubation on 8/21. MRI shows Acute right MCA territory infarcts with petechial hemorrhage, as  described above. No mass effect. Pt with hx of R posterior MCA embolic stroke 2019, R temporoparietal lobe with upper M2 cutoff 2017 (WFL on Cognistat), Afib, on Xarelto     Assessment / Plan / Recommendation  Clinical Impression  Pt demonstrates a mild oral dysphagia due to left facial nerve impairment, seems to have good sensation. Pt coughs consistently after sips of thin liquids. WIll proceed with FEES today for instrumental assessment of swallowing. Pt and family in agreement. Pt to remain NPO except meds whole in pudding. SLP Visit Diagnosis: Dysphagia, oral phase (R13.11);Dysphagia, unspecified (R13.10)    Aspiration Risk  Mild aspiration risk    Diet Recommendation      Medication Administration: Whole meds with puree Supervision: Staff to assist with self feeding    Other  Recommendations       Assistance Recommended at Discharge    Functional Status Assessment    Frequency and Duration            Prognosis Prognosis for improved oropharyngeal function: Good      Swallow Study   General HPI: [name] is a 58 y.o. female CODE STROKE after acute onset of left-sided weakness while at work. Underwent thrombectomy and brief intubation on 8/21. MRI shows Acute right MCA territory infarcts with petechial hemorrhage, as  described  above. No mass effect. Pt with hx of R posterior MCA embolic stroke 2019, R temporoparietal lobe with upper M2 cutoff 2017 (WFL on Cognistat), Afib, on Xarelto  Type of Study: Bedside Swallow Evaluation Previous Swallow Assessment: none Diet Prior to this Study: NPO Temperature Spikes Noted: No Respiratory Status: Room air History of Recent Intubation: Yes Total duration of intubation (days): 1  days Date extubated: 05/05/24 Behavior/Cognition: Alert;Cooperative;Pleasant mood Oral Cavity Assessment: Excessive secretions Oral Care Completed by SLP: Yes Oral Cavity - Dentition: Adequate natural dentition Vision: Impaired for self-feeding Self-Feeding Abilities: Needs assist Patient Positioning: Upright in chair Baseline Vocal Quality: Normal Volitional Cough: Strong Volitional Swallow: Able to elicit    Oral/Motor/Sensory Function Overall Oral Motor/Sensory Function: Moderate impairment Facial ROM: Reduced left;Suspected CN VII (facial) dysfunction Facial Symmetry: Abnormal symmetry left;Suspected CN VII (facial) dysfunction Facial Strength: Reduced left;Suspected CN VII (facial) dysfunction Facial Sensation: Within Functional Limits Lingual ROM: Within Functional Limits Lingual Symmetry: Within Functional Limits Lingual Strength: Within Functional Limits Lingual Sensation: Within Functional Limits Velum: Within Functional Limits Mandible: Within Functional Limits   Ice Chips Ice chips: Impaired Oral Phase Impairments: Reduced labial seal   Thin Liquid Thin Liquid: Impaired Presentation: Straw;Cup Oral Phase Impairments: Reduced labial seal Oral Phase Functional Implications: Left anterior spillage Pharyngeal  Phase Impairments: Cough - Immediate    Nectar Thick Nectar Thick Liquid: Not tested   Honey Thick Honey Thick Liquid: Not tested   Puree Puree: Impaired Presentation: Spoon Oral Phase Impairments: Reduced labial seal Oral Phase Functional Implications: Prolonged oral transit;Oral residue   Solid     Solid: Not tested      Eino Whitner, Consuelo Fitch 05/06/2024,11:33 AM

## 2024-05-06 NOTE — Evaluation (Signed)
 Speech Language Pathology Evaluation Patient Details Name: KAMARII BUREN MRN: [id number] DOB: March 19, 1966 Today's Date: 05/06/2024 Time: 1014-1040 SLP Time Calculation (min) (ACUTE ONLY): 26 min  Problem List:  Patient Active Problem List   Diagnosis Date Noted   Acute ischemic right MCA stroke (HCC) 05/05/2024   Stroke (cerebrum) (HCC) 05/05/2024   Type 2 diabetes mellitus without complication, without long-term current use of insulin  (HCC) 06/17/2021   Coronary artery disease involving native coronary artery of native heart without angina pectoris 04/10/2021   Chronic combined systolic and diastolic CHF (congestive heart failure) (HCC) 04/01/2021   Longstanding persistent atrial fibrillation (HCC) 04/16/2020   Secondary hypercoagulable state (HCC) 04/16/2020   Edema leg 05/17/2019   Severe obesity (BMI 35.0-39.9) with comorbidity (HCC) 04/04/2019   Prediabetes 04/04/2019   Seasonal allergic rhinitis due to pollen 12/14/2018   Chronic back pain 08/23/2018   OSA on CPAP 06/23/2018   Headache 01/29/2017   Refractive errors 04/15/2016   Medical non-compliance 11/06/2015   Hyperlipidemia LDL goal <70 11/06/2015   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   NSVT (nonsustained ventricular tachycardia) (HCC) 11/06/2015   History of CVA (cerebrovascular accident)    Claudication of both lower extremities (HCC) 03/22/2015   Chronic atrial fibrillation (HCC) 04/12/2014   Pap smear for cervical cancer screening 03/28/2014   Cigarette smoker 06/15/2013   Ventral hernia 06/15/2013   Chronic anticoagulation 02/28/2013   Hx-TIA (transient ischemic attack) 02/05/13 02/15/2013   Essential hypertension 02/15/2013   Past Medical History:  Past Medical History:  Diagnosis Date   Clotting disorder (HCC)    on xarelto    Essential hypertension    Heart murmur    a. 10/2015 Echo: EF 60-65%, no rwma, mild AI/MR, sev dil LA/RA, PASP .   Noncompliance    NSVT (nonsustained ventricular tachycardia)  (HCC)    a. 10/2015 during admission for CVA/AF.   Persistent atrial fibrillation (HCC)    a. CHA2DS2VASC = 4-->Xarelto ;  b. 02/2016 Successful DCCV.  c. Recurrent fib   Pneumonia 2012 x 2; 2015   Stroke [facility])    a. 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   Transient ischemic attack (TIA) 01/2013   Past Surgical History:  Past Surgical History:  Procedure Laterality Date   CARDIOVERSION N/A 02/18/2013   Procedure: CARDIOVERSION;  Surgeon: Jerel Balding, MD;  Location: [facility] ENDOSCOPY;  Service: Cardiovascular;  Laterality: N/A;   CARDIOVERSION N/A 02/23/2013   Procedure: CARDIOVERSION;  Surgeon: Alm LELON Clay, MD;  Location: [facility] OR;  Service: Cardiovascular;  Laterality: N/A;  BESIDE CV   CARDIOVERSION N/A 03/12/2016   Procedure: CARDIOVERSION;  Surgeon: Ezra GORMAN Shuck, MD;  Location: [facility] ENDOSCOPY;  Service: Cardiovascular;  Laterality: N/A;   RIGHT/LEFT HEART CATH AND CORONARY ANGIOGRAPHY N/A 04/02/2021   Procedure: RIGHT/LEFT HEART CATH AND CORONARY ANGIOGRAPHY;  Surgeon: Claudene Victory LELON, MD;  Location: [facility] INVASIVE CV LAB;  Service: Cardiovascular;  Laterality: N/A;   TEE WITHOUT CARDIOVERSION N/A 02/18/2013   Procedure: TRANSESOPHAGEAL ECHOCARDIOGRAM (TEE);  Surgeon: Jerel Balding, MD;  Location: [facility] ENDOSCOPY;  Service: Cardiovascular;  Laterality: N/A;   TUBAL LIGATION  1992   HPI:  SHELBIA SCINTO is a 58 y.o. female CODE STROKE after acute onset of left-sided weakness while at work. Underwent thrombectomy and brief intubation on 8/21. MRI shows Acute right MCA territory infarcts with petechial hemorrhage, as  described above. No mass effect. Pt with hx of R posterior MCA embolic stroke 2019, R temporoparietal  lobe with upper M2 cutoff 2017 (WFL on Cognistat), Afib, on Xarelto    Assessment / Plan / Recommendation Clinical Impression  Pt demonstrates impairments consistent with right hemisphere disorder. Though  speech, language, memory all in tact, pt has poor awareness of impact of deficits on function. Affect is flat, needs cues to initiate and complete verbal and function tasks. Cues to track left and sweep right with vision to compensate for hemianopsia. Will f/u for further cognitive interventions.    SLP Assessment  SLP Recommendation/Assessment: Patient needs continued Speech Language Pathology Services SLP Visit Diagnosis: Frontal lobe and executive function deficit Frontal lobe and executive function deficit following: Cerebral infarction     Assistance Recommended at Discharge  Frequent or constant Supervision/Assistance  Functional Status Assessment    Frequency and Duration min 2x/week  2 weeks      SLP Evaluation Cognition  Overall Cognitive Status: Impaired/Different from baseline Arousal/Alertness: Awake/alert Orientation Level: Oriented X4 Attention: Focused;Sustained Focused Attention: Appears intact Sustained Attention: Impaired Sustained Attention Impairment: Verbal basic;Functional basic Memory: Appears intact Awareness: Impaired Awareness Impairment: Emergent impairment;Anticipatory impairment Problem Solving: Impaired Problem Solving Impairment: Functional basic Executive Function: Initiating Behaviors: Other (comment) (flat affect) Safety/Judgment: Impaired       Comprehension  Auditory Comprehension Overall Auditory Comprehension: Appears within functional limits for tasks assessed    Expression Verbal Expression Overall Verbal Expression: Impaired Pragmatics: Impairment Impairments: Abnormal affect;Eye contact;Topic maintenance   Oral / Motor  Oral Motor/Sensory Function Overall Oral Motor/Sensory Function: Moderate impairment Facial ROM: Reduced left;Suspected CN VII (facial) dysfunction Facial Symmetry: Abnormal symmetry left;Suspected CN VII (facial) dysfunction Facial Strength: Reduced left;Suspected CN VII (facial) dysfunction Facial Sensation:  Within Functional Limits Lingual ROM: Within Functional Limits Lingual Symmetry: Within Functional Limits Lingual Strength: Within Functional Limits Lingual Sensation: Within Functional Limits Velum: Within Functional Limits Mandible: Within Functional Limits Motor Speech Overall Motor Speech: Appears within functional limits for tasks assessed            Laprecious Austill, Consuelo Fitch 05/06/2024, 11:41 AM

## 2024-05-06 NOTE — Progress Notes (Signed)
  Inpatient Rehabilitation Admissions Coordinator   Met with patient and two of her daughters at bedside for rehab assessment. We discussed goals and expectations of a possible CIR admit. They prefer CIR for rehab.  There are 7 children and they will provide 24/7 care. We discussed ramp and railing access into the home.Once I have OT eval,  I will begin insurance Auth with [employer] [facility] for possible CIR admit pending approval. Please call me with any questions.   Heron Leavell, RN, MSN Rehab Admissions Coordinator [phone]

## 2024-05-06 NOTE — Progress Notes (Addendum)
 STROKE TEAM PROGRESS NOTE    SIGNIFICANT HOSPITAL EVENTS 8/21-mechanical thrombectomy with TICI 2b revascularization  INTERIM HISTORY/SUBJECTIVE Mechanical thrombectomy done yesterday with tiki 2B revascularization.  Patient is sitting up in the chair with 2 family members at the bedside.  MRI done.  Plan to start Xarelto  in the next couple of days.  She is now on aspirin  325 mg.  She is not currently taking any of her medications because she ran out.  Will address with case management.  OBJECTIVE  CBC    Component Value Date/Time   WBC 14.2 (H) 05/06/2024 0559   RBC 4.32 05/06/2024 0559   HGB 11.6 (L) 05/06/2024 0559   HCT 36.0 05/06/2024 0559   PLT 159 05/06/2024 0559   MCV 83.3 05/06/2024 0559   MCH 26.9 05/06/2024 0559   MCHC 32.2 05/06/2024 0559   RDW 13.9 05/06/2024 0559   LYMPHSABS 2.6 05/05/2024 1115   MONOABS 0.6 05/05/2024 1115   EOSABS 0.1 05/05/2024 1115   BASOSABS 0.1 05/05/2024 1115    BMET    Component Value Date/Time   NA 141 05/06/2024 0559   NA 138 04/13/2024 1621   K 4.0 05/06/2024 0559   CL 111 05/06/2024 0559   CO2 23 05/06/2024 0559   GLUCOSE 135 (H) 05/06/2024 0559   BUN 10 05/06/2024 0559   BUN 13 04/13/2024 1621   CREATININE 0.87 05/06/2024 0559   CREATININE 0.88 03/07/2016 1459   CALCIUM  9.0 05/06/2024 0559   EGFR 74 04/13/2024 1621   GFRNONAA >60 05/06/2024 0559   GFRNONAA 78 03/22/2015 1301    IMAGING past 24 hours MR BRAIN WO CONTRAST Result Date: 05/06/2024 CLINICAL DATA:  Stroke, follow up EXAM: MRI HEAD WITHOUT CONTRAST TECHNIQUE: Multiplanar, multiecho pulse sequences of the brain and surrounding structures were obtained without intravenous contrast. COMPARISON:  Same day CT head. FINDINGS: Brain: Multifocal acute right MCA territory infarcts including infarcts in the right basal ganglia, right frontal and right parietal lobes. Associated edema without mass effect. No midline shift. Areas of petechial hemorrhage in the right parietal  lobe. Small additional acute infarct in the splenium of the corpus callosum. No mass occupying acute hemorrhage. No hydrocephalus. No mass lesion. Remote left cerebellar infarcts. Patchy T2/FLAIR hyperintensities in the white matter, compatible with chronic microvascular ischemic disease. Vascular: Better evaluated on recent catheter arteriogram and CTA. Skull and upper cervical spine: Normal marrow signal. Sinuses/Orbits: Clear sinuses.  No acute orbital findings. IMPRESSION: Acute right MCA territory infarcts with petechial hemorrhage, as described above. No mass effect. Electronically Signed   By: Gilmore GORMAN Molt M.D.   On: 05/06/2024 00:55   ECHOCARDIOGRAM COMPLETE Result Date: 05/05/2024    ECHOCARDIOGRAM REPORT   Patient Name:   CHLOIE LONEY Date of Exam: 05/05/2024 Medical Rec #:  [id number]     Height:       64.0 in Accession #:    [id number]    Weight:       203.0 lb Date of Birth:  [birth year]      BSA:          1.969 m Patient Age:    58 years      BP:           126/76 mmHg Patient Gender: F             HR:           95 bpm. Exam Location:  Inpatient Procedure: 2D Echo, Cardiac Doppler and Color Doppler (Both Spectral and Color  Flow Doppler were utilized during procedure). Indications:    Stroke I63.9  History:        Patient has prior history of Echocardiogram examinations, most                 recent 02/27/2023. CHF, Angina, Stroke and TIA, Arrythmias:Atrial                 Fibrillation and Tachycardia; Risk Factors:Hypertension, Sleep                 Apnea, Diabetes, Current Smoker and Dyslipidemia.  Sonographer:    Thea Norlander RCS Referring Phys: ERIN C LEHNER IMPRESSIONS  1. Left ventricular ejection fraction, by estimation, is 60 to 65%. The left ventricle has normal function. The left ventricle has no regional wall motion abnormalities. There is mild concentric left ventricular hypertrophy. Left ventricular diastolic function could not be evaluated.  2. Right ventricular systolic  function is normal. The right ventricular size is normal. There is mildly elevated pulmonary artery systolic pressure. The estimated right ventricular systolic pressure is 41.0 mmHg.  3. Left atrial size was moderately dilated.  4. Right atrial size was moderately dilated.  5. The mitral valve is normal in structure. Trivial mitral valve regurgitation. No evidence of mitral stenosis.  6. Tricuspid valve regurgitation is mild to moderate.  7. The aortic valve is tricuspid. There is mild calcification of the aortic valve. Aortic valve regurgitation is mild. No aortic stenosis is present.  8. The inferior vena cava is normal in size with greater than 50% respiratory variability, suggesting right atrial pressure of 3 mmHg. FINDINGS  Left Ventricle: Left ventricular ejection fraction, by estimation, is 60 to 65%. The left ventricle has normal function. The left ventricle has no regional wall motion abnormalities. The left ventricular internal cavity size was normal in size. There is  mild concentric left ventricular hypertrophy. Left ventricular diastolic function could not be evaluated due to atrial fibrillation. Left ventricular diastolic function could not be evaluated. Right Ventricle: The right ventricular size is normal. No increase in right ventricular wall thickness. Right ventricular systolic function is normal. There is mildly elevated pulmonary artery systolic pressure. The tricuspid regurgitant velocity is 3.00  m/s, and with an assumed right atrial pressure of 5 mmHg, the estimated right ventricular systolic pressure is 41.0 mmHg. Left Atrium: Left atrial size was moderately dilated. Right Atrium: Right atrial size was moderately dilated. Pericardium: There is no evidence of pericardial effusion. Mitral Valve: The mitral valve is normal in structure. Mild mitral annular calcification. Trivial mitral valve regurgitation. No evidence of mitral valve stenosis. Tricuspid Valve: The tricuspid valve is normal in  structure. Tricuspid valve regurgitation is mild to moderate. No evidence of tricuspid stenosis. Aortic Valve: The aortic valve is tricuspid. There is mild calcification of the aortic valve. Aortic valve regurgitation is mild. No aortic stenosis is present. Aortic valve peak gradient measures 6.2 mmHg. Pulmonic Valve: The pulmonic valve was normal in structure. Pulmonic valve regurgitation is not visualized. No evidence of pulmonic stenosis. Aorta: The aortic root is normal in size and structure. Venous: The inferior vena cava is normal in size with greater than 50% respiratory variability, suggesting right atrial pressure of 3 mmHg. IAS/Shunts: No atrial level shunt detected by color flow Doppler.  LEFT VENTRICLE PLAX 2D LVIDd:         3.70 cm   Diastology LVIDs:         2.50 cm   LV e' medial:  6.16 cm/s LV PW:         1.20 cm   LV E/e' medial:  16.4 LV IVS:        1.30 cm   LV e' lateral:   12.40 cm/s LVOT diam:     2.00 cm   LV E/e' lateral: 8.1 LV SV:         44 LV SV Index:   22 LVOT Area:     3.14 cm  RIGHT VENTRICLE            IVC RV S prime:     8.45 cm/s  IVC diam: 2.60 cm TAPSE (M-mode): 1.5 cm LEFT ATRIUM           Index        RIGHT ATRIUM           Index LA diam:      3.50 cm 1.78 cm/m   RA Area:     18.70 cm LA Vol (A2C): 55.4 ml 28.11 ml/m  RA Volume:   46.30 ml  23.52 ml/m LA Vol (A4C): 52.7 ml 26.77 ml/m  AORTIC VALVE AV Area (Vmax): 2.50 cm AV Vmax:        124.00 cm/s AV Peak Grad:   6.2 mmHg LVOT Vmax:      98.50 cm/s LVOT Vmean:     59.200 cm/s LVOT VTI:       0.140 m  AORTA Ao Root diam: 3.00 cm Ao Asc diam:  3.40 cm MITRAL VALVE                TRICUSPID VALVE MV Area (PHT): 5.75 cm     TR Peak grad:   36.0 mmHg MV Decel Time: 132 msec     TR Vmax:        300.00 cm/s MV E velocity: 101.00 cm/s                             SHUNTS                             Systemic VTI:  0.14 m                             Systemic Diam: 2.00 cm Toribio Fuel MD Electronically signed by Toribio Fuel MD Signature Date/Time: 05/05/2024/6:12:30 PM    Final     Vitals:   05/06/24 1030 05/06/24 1100 05/06/24 1130 05/06/24 1139  BP: 139/89 (!) 143/73 (!) 153/118   Pulse: 97 90 (!) 103   Resp: 13 17 17    Temp:    98.3 F (36.8 C)  TempSrc:    Oral  SpO2: 97% 99% 99%   Weight:      Height:         PHYSICAL EXAM General:  Alert, well-nourished, well-developed patient in no acute distress Psych:  Mood and affect appropriate for situation CV: Regular rate and rhythm on monitor Respiratory:  Regular, unlabored respirations on room air GI: Abdomen soft and nontender   NEURO:  Mental Status: awake, alert, eyes open, orientated to age, place, time and people. Speech/Language: speech is mildly dysarthric.  Naming and repeating intact, follows simple commands  Cranial Nerves:  II: PERRL. Left lower quadrantanopia with left upper quadrant simultagnosia III, IV, VI: EOMI. Eyelids elevate symmetrically.  V: Sensation is intact to light  touch and symmetrical to face.  VII: Left facial droop VIII: hearing intact to voice. IX, X: Palate elevates symmetrically. Phonation is normal.  KP:Dynloizm shrug 5/5. XII: tongue protrusion to the left Motor: Right upper and lower extremity 5 out of 5 Left upper and lower extremity with increased muscle tone Left upper extremity 0/5 in left lower extremity 2/5 Tone: is normal and bulk is normal Sensation-diminished sensation on the left with mild left-sided neglect Coordination: FTN intact on the right Gait- deferred  Most Recent NIH 14     ASSESSMENT/PLAN  [name] is a 58 y.o. 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  NIH on Admission 12  Stroke:  right MCA and ACA scattered infarcts s/p IR with TICI2b, etiology: Likely cardioembolic in the setting of  A-fib not compliant with Xarelto   Code Stroke CT head - No acute finding. Redemonstrated chronic infarcts within the right MCA vascular territory and within the cerebellum. CTA head & neck Abrupt occlusion of the right middle cerebral artery mid-to-distal M1 segment. Plaque within the proximal right ICA resulting in less than 50% stenosis, progressed since the CTA of 02/08/2018. Progressive plaque within the proximal left ICA resulting in 65% stenosis. MRI  Acute right MCA and ACA territory infarcts with petechial hemorrhage  2D Echo 60-65% LDL 109 HgbA1c 6.1 UDS pending VTE prophylaxis - lovenox  Xarelto  (rivaroxaban ) daily prior to admission, now on aspirin  325 mg daily for 2-3 days and then re-start xarelto  if neuro stable Therapy recommendations:  CIR Disposition:  Pending   Atrial fibrillation Home med: Xarelto -reports noncompliance Rate controlled On digoxin  and metoprolol  now on aspirin  325 mg daily for 2-3 days and then re-start xarelto  if neuro stable Medication compliance education provided  History of stroke 10/2015 admitted for right gaze and left-sided weakness.  Status post TNK.  MRI showed right temporal lobe infarct.  CTA head and neck right M2 occlusion.  CT perfusion negative.  EF 60 to 65%, LDL 89, A1c 6.5.  Patient noncompliant with Xarelto  at that time.  Discharged on Xarelto . 01/2018 admitted for right MCA infarct.  CT head and neck no LVO.  EF 60 to 65%.  LDL 102, A1c 6.1.  Xarelto  continued.  Increase Lipitor  from 20 to 80.  Hypertension Nonischemic cardiomyopathy Combined HFrEF Home meds GDMT: Farxiga , digoxin , Lasix , Entresto , Aldactone -reports noncompliance with meds EF 25-30% in 03/2021 This admission EF 60 to 65% On metoprolol  and spironolactone  BP stable Long-term BP goal normotensive  Hyperlipidemia Home med: Crestor  20-reports noncompliance LDL 109, goal < 70 Resume Crestor  20 Continue statin at discharge  Dysphagia Patient has post-stroke  dysphagia, SLP consulted On dysphagia 2 and thin liquid Advance diet as tolerated  Other stroke risk factors Medication noncompliance Obesity, BMI 34.84  Other medical issues Leukocytosis WBC 14.2  Hospital day # 1  Patient seen and examined by NP/APP with MD. MD to update note as needed.   Jorene Last, DNP, FNP-BC [facility] Pager: [phone]  ATTENDING NOTE: I reviewed above note and agree with the assessment and plan. Pt was seen and examined.   Two daughters are at the bedside. Pt sitting in chair, awake, alert, eyes open, orientated to age, place, time and people. No aphasia, fluent language with mild dysarthria, following all simple commands. Able to name and repeat. Right gaze  preference but cross midline, tracking bilaterally, visual field testing showed left lower quadrantanopia with left upper quadrant simultagnosia. L facial droop. Tongue protrusion to the left. RUE and RLE 5/5, LUE and LLE significantly increased muscle tone. RUE 0/5 and RLE 2/5. Sensation decreased on the LUE and LLE with left sensory simultagnosia, R FTN intact, gait not tested.   Patient stroke likely due to A-fib noncompliant with Xarelto .  MRI showed scattered R MCA and ACA infarcts.  Now on aspirin  325, will restart Xarelto  in 2 to 3 days if neuro stable.  Medication compliance education provided.  Had a swallow, on statin and aspirin .  On metoprolol  and spironolactone  and digoxin .  Aggressive risk factor modification.  PT and OT recommend CIR.  For detailed assessment and plan, please refer to above as I have made changes wherever appropriate.   Ary Cummins, MD PhD Stroke Neurology 05/06/2024 5:40 PM  This patient is critically ill due to right MCA and ACA stroke, status post thrombectomy, heart failure, A-fib and at significant risk of neurological worsening, death form recurrent stroke, hemorrhagic admission, heart failure, A-fib RVR. This patient's care requires constant  monitoring of vital signs, hemodynamics, respiratory and cardiac monitoring, review of multiple databases, neurological assessment, discussion with family, other specialists and medical decision making of high complexity. I spent 40 minutes of neurocritical care time in the care of this patient. I had long discussion with patient and 2 daughters at bedside, updated pt current condition, treatment plan and potential prognosis, and answered all the questions.  They expressed understanding and appreciation.       To contact Stroke Continuity provider, please refer to [email or web address]. After hours, contact General Neurology

## 2024-05-06 NOTE — Progress Notes (Signed)
  Inpatient Rehab Admissions Coordinator :  Per therapy recommendations, patient was screened for CIR candidacy by Ottie Glazier RN MSN.  At this time patient appears to be a potential candidate for CIR. I will place a rehab consult per protocol for full assessment. Please call me with any questions.  Ottie Glazier RN MSN Admissions Coordinator [phone]

## 2024-05-07 DIAGNOSIS — I63421 Cerebral infarction due to embolism of right anterior cerebral artery: Secondary | ICD-10-CM | POA: Diagnosis not present

## 2024-05-07 DIAGNOSIS — I69391 Dysphagia following cerebral infarction: Secondary | ICD-10-CM | POA: Diagnosis not present

## 2024-05-07 DIAGNOSIS — R233 Spontaneous ecchymoses: Secondary | ICD-10-CM | POA: Diagnosis not present

## 2024-05-07 DIAGNOSIS — E785 Hyperlipidemia, unspecified: Secondary | ICD-10-CM | POA: Diagnosis not present

## 2024-05-07 DIAGNOSIS — I63511 Cerebral infarction due to unspecified occlusion or stenosis of right middle cerebral artery: Secondary | ICD-10-CM | POA: Diagnosis not present

## 2024-05-07 DIAGNOSIS — T45516A Underdosing of anticoagulants, initial encounter: Secondary | ICD-10-CM | POA: Diagnosis not present

## 2024-05-07 DIAGNOSIS — R29714 NIHSS score 14: Secondary | ICD-10-CM | POA: Diagnosis not present

## 2024-05-07 DIAGNOSIS — I4891 Unspecified atrial fibrillation: Secondary | ICD-10-CM | POA: Diagnosis not present

## 2024-05-07 DIAGNOSIS — Z91148 Patient's other noncompliance with medication regimen for other reason: Secondary | ICD-10-CM | POA: Diagnosis not present

## 2024-05-07 LAB — GLUCOSE, CAPILLARY
Glucose-Capillary: 109 mg/dL — ABNORMAL HIGH (ref 70–99)
Glucose-Capillary: 96 mg/dL (ref 70–99)
Glucose-Capillary: 99 mg/dL (ref 70–99)
Glucose-Capillary: 124 mg/dL — ABNORMAL HIGH (ref 70–99)

## 2024-05-07 LAB — BASIC METABOLIC PANEL WITH GFR
Anion gap: 5 (ref 5–15)
BUN: 12 mg/dL (ref 6–20)
CO2: 24 mmol/L (ref 22–32)
Calcium: 8.8 mg/dL — ABNORMAL LOW (ref 8.9–10.3)
Chloride: 110 mmol/L (ref 98–111)
Creatinine, Ser: 0.9 mg/dL (ref 0.44–1.00)
GFR, Estimated: >60 mL/min (ref >60)
Glucose, Bld: 105 mg/dL — ABNORMAL HIGH (ref 70–99)
Potassium: 3.8 mmol/L (ref 3.5–5.1)
Sodium: 139 mmol/L (ref 135–145)

## 2024-05-07 LAB — CBC
HCT: 34.2 % — ABNORMAL LOW (ref 36.0–46.0)
Hemoglobin: 11 g/dL — ABNORMAL LOW (ref 12.0–15.0)
MCH: 27.2 pg (ref 26.0–34.0)
MCHC: 32.2 g/dL (ref 30.0–36.0)
MCV: 84.4 fL (ref 80.0–100.0)
Platelets: 159 K/uL (ref 150–400)
RBC: 4.05 MIL/uL (ref 3.87–5.11)
RDW: 14.2 % (ref 11.5–15.5)
WBC: 13.9 K/uL — ABNORMAL HIGH (ref 4.0–10.5)
nRBC: 0 % (ref 0.0–0.2)

## 2024-05-07 MED ORDER — SODIUM CHLORIDE 0.9 % IV BOLUS
500.0000 mL | Freq: Once | INTRAVENOUS | Status: AC
  Administered 2024-05-07: 500 mL via INTRAVENOUS

## 2024-05-07 MED ORDER — HYDROXYZINE HCL 25 MG PO TABS
50.0000 mg | ORAL_TABLET | Freq: Once | ORAL | Status: AC
  Administered 2024-05-07: 50 mg via ORAL
  Filled 2024-05-07: qty 2

## 2024-05-07 NOTE — Progress Notes (Addendum)
 STROKE TEAM PROGRESS NOTE    SIGNIFICANT HOSPITAL EVENTS 8/21-mechanical thrombectomy with TICI 2b revascularization 8/22- transfer out of ICU, start ASA 325mg  8/23-   INTERIM HISTORY/SUBJECTIVE TOC following for PCP and medication assistance. Daughter [name] at the bedside.  More tired today, but she did not sleep well overnight due to scans. Received PRN BP medications this morning and now more hypotensive, will give 500cc at 273ml/hr one time  Initially slow to open eyes but does eventually without difficulty  OBJECTIVE  CBC    Component Value Date/Time   WBC 13.9 (H) 05/07/2024 0607   RBC 4.05 05/07/2024 0607   HGB 11.0 (L) 05/07/2024 0607   HCT 34.2 (L) 05/07/2024 0607   PLT 159 05/07/2024 0607   MCV 84.4 05/07/2024 0607   MCH 27.2 05/07/2024 0607   MCHC 32.2 05/07/2024 0607   RDW 14.2 05/07/2024 0607   LYMPHSABS 2.6 05/05/2024 1115   MONOABS 0.6 05/05/2024 1115   EOSABS 0.1 05/05/2024 1115   BASOSABS 0.1 05/05/2024 1115    BMET    Component Value Date/Time   NA 139 05/07/2024 0607   NA 138 04/13/2024 1621   K 3.8 05/07/2024 0607   CL 110 05/07/2024 0607   CO2 24 05/07/2024 0607   GLUCOSE 105 (H) 05/07/2024 0607   BUN 12 05/07/2024 0607   BUN 13 04/13/2024 1621   CREATININE 0.90 05/07/2024 0607   CREATININE 0.88 03/07/2016 1459   CALCIUM  8.8 (L) 05/07/2024 0607   EGFR 74 04/13/2024 1621   GFRNONAA >60 05/07/2024 0607   GFRNONAA 78 03/22/2015 1301    IMAGING past 24 hours No results found.   Vitals:   05/07/24 0811 05/07/24 0900 05/07/24 1000 05/07/24 1129  BP: (!) 168/106 (!) 184/94 98/69 (!) 89/64  Pulse: 84 82  73  Resp: 19   19  Temp: (!) 97.5 F (36.4 C)   98.5 F (36.9 C)  TempSrc: Axillary   Oral  SpO2: 98%   98%  Weight:      Height:         PHYSICAL EXAM General:  Well-nourished, well-developed patient in no acute distress Psych:  Mood and affect appropriate for situation CV: Regular rate and rhythm on monitor Respiratory:   Regular, unlabored respirations on room air GI: Abdomen soft and nontender   NEURO:  Mental Status: Awakens easily, more drowsy today, eyes open, orientated to age, place, time and people. Speech/Language: speech is mildly dysarthric.  Naming and repeating intact, follows simple commands  Cranial Nerves:  II: PERRL. Left lower quadrantanopia with left upper quadrant simultagnosia. Initially slow to open eyes, but eventually does without difficulty. III, IV, VI: EOMI. Eyelids elevate symmetrically.  V: Sensation is intact to light touch and symmetrical to face.  VII: Left facial droop VIII: hearing intact to voice. IX, X: Palate elevates symmetrically. Phonation is normal.  KP:Dynloizm shrug 5/5. XII: tongue protrusion to the left Motor: Right upper and lower extremity 5 out of 5 Left upper and lower extremity with increased muscle tone Left upper extremity 0/5 in left lower extremity 2/5 Tone: is normal and bulk is normal Sensation-diminished sensation on the left with mild left-sided neglect Coordination: FTN intact on the right Gait- deferred  Most Recent NIH 14     ASSESSMENT/PLAN  [name] is a 58 y.o. 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  NIH on Admission 12  Stroke:  right MCA and ACA scattered infarcts s/p IR with TICI2b, etiology: Likely cardioembolic in the setting of A-fib not compliant with Xarelto   Code Stroke CT head - No acute finding. Redemonstrated chronic infarcts within the right MCA vascular territory and within the cerebellum. CTA head & neck Abrupt occlusion of the right middle cerebral artery mid-to-distal M1 segment. Plaque within the proximal right ICA resulting in less than 50% stenosis, progressed since the CTA of 02/08/2018. Progressive plaque within the proximal left ICA  resulting in 65% stenosis. MRI  Acute right MCA and ACA territory infarcts with petechial hemorrhage  2D Echo 60-65% LDL 109 HgbA1c 6.1 UDS Negative VTE prophylaxis - lovenox  Xarelto  (rivaroxaban ) daily prior to admission, now on aspirin  325 mg daily for 2-3 days and then re-start xarelto  if neuro stable Therapy recommendations:  CIR Disposition:  Pending   Atrial fibrillation Home med: Xarelto -reports noncompliance Rate controlled On digoxin  and metoprolol  now on aspirin  325 mg daily for 2-3 days and then re-start xarelto  if neuro stable Medication compliance education provided  History of stroke 10/2015 admitted for right gaze and left-sided weakness.  Status post TNK.  MRI showed right temporal lobe infarct.  CTA head and neck right M2 occlusion.  CT perfusion negative.  EF 60 to 65%, LDL 89, A1c 6.5.  Patient noncompliant with Xarelto  at that time.  Discharged on Xarelto . 01/2018 admitted for right MCA infarct.  CT head and neck no LVO.  EF 60 to 65%.  LDL 102, A1c 6.1.  Xarelto  continued.  Increase Lipitor  from 20 to 80.  Hypertension Nonischemic cardiomyopathy Combined HFrEF Home meds GDMT: Farxiga , digoxin , Lasix , Entresto , Aldactone -reports noncompliance with meds EF 25-30% in 03/2021 This admission EF 60 to 65% On metoprolol  and spironolactone  BP stable Long-term BP goal normotensive  Hyperlipidemia Home med: Crestor  20-reports noncompliance LDL 109, goal < 70 Resume Crestor  20 Continue statin at discharge  Dysphagia Patient has post-stroke dysphagia, SLP consulted On dysphagia 2 and thin liquid Advance diet as tolerated  Other stroke risk factors Medication noncompliance Obesity, BMI 34.84  Other medical issues Leukocytosis WBC 14.2  Hospital day # 2  Patient seen and examined by NP/APP with MD. MD to update note as needed.   Jorene Last, DNP, FNP-BC [facility] Pager: [phone]  Sarajane Daring, MD [facility]      To  contact Stroke Continuity provider, please refer to [email or web address]. After hours, contact General Neurology

## 2024-05-07 NOTE — Plan of Care (Signed)
  Problem: Nutrition: Goal: Risk of aspiration will decrease Outcome: Progressing   Problem: Clinical Measurements: Goal: Will remain free from infection Outcome: Progressing Goal: Respiratory complications will improve Outcome: Progressing   Problem: Coping: Goal: Level of anxiety will decrease Outcome: Progressing   Problem: Elimination: Goal: Will not experience complications related to bowel motility Outcome: Progressing Goal: Will not experience complications related to urinary retention Outcome: Progressing   Problem: Pain Managment: Goal: General experience of comfort will improve and/or be controlled Outcome: Progressing   Problem: Safety: Goal: Ability to remain free from injury will improve Outcome: Progressing   Problem: Cardiovascular: Goal: Ability to achieve and maintain adequate cardiovascular perfusion will improve Outcome: Progressing Goal: Vascular access site(s) Level 0-1 will be maintained Outcome: Progressing   Problem: Metabolic: Goal: Ability to maintain appropriate glucose levels will improve Outcome: Progressing   Problem: Tissue Perfusion: Goal: Adequacy of tissue perfusion will improve Outcome: Progressing

## 2024-05-07 NOTE — Plan of Care (Signed)
 Problem: Education: Goal: Knowledge of disease or condition will improve Outcome: Progressing Goal: Knowledge of secondary prevention will improve (MUST DOCUMENT ALL) Outcome: Progressing Goal: Knowledge of patient specific risk factors will improve (DELETE if not current risk factor) Outcome: Progressing   Problem: Ischemic Stroke/TIA Tissue Perfusion: Goal: Complications of ischemic stroke/TIA will be minimized Outcome: Progressing   Problem: Coping: Goal: Will verbalize positive feelings about self Outcome: Progressing Goal: Will identify appropriate support needs Outcome: Progressing   Problem: Health Behavior/Discharge Planning: Goal: Ability to manage health-related needs will improve Outcome: Progressing Goal: Goals will be collaboratively established with patient/family Outcome: Progressing   Problem: Self-Care: Goal: Ability to participate in self-care as condition permits will improve Outcome: Progressing Goal: Verbalization of feelings and concerns over difficulty with self-care will improve Outcome: Progressing Goal: Ability to communicate needs accurately will improve Outcome: Progressing   Problem: Nutrition: Goal: Risk of aspiration will decrease Outcome: Progressing Goal: Dietary intake will improve Outcome: Progressing   Problem: Education: Goal: Knowledge of General Education information will improve Description: Including pain rating scale, medication(s)/side effects and non-pharmacologic comfort measures Outcome: Progressing   Problem: Health Behavior/Discharge Planning: Goal: Ability to manage health-related needs will improve Outcome: Progressing   Problem: Clinical Measurements: Goal: Ability to maintain clinical measurements within normal limits will improve Outcome: Progressing Goal: Will remain free from infection Outcome: Progressing Goal: Diagnostic test results will improve Outcome: Progressing Goal: Respiratory complications will  improve Outcome: Progressing Goal: Cardiovascular complication will be avoided Outcome: Progressing   Problem: Activity: Goal: Risk for activity intolerance will decrease Outcome: Progressing   Problem: Nutrition: Goal: Adequate nutrition will be maintained Outcome: Progressing   Problem: Coping: Goal: Level of anxiety will decrease Outcome: Progressing   Problem: Elimination: Goal: Will not experience complications related to bowel motility Outcome: Progressing Goal: Will not experience complications related to urinary retention Outcome: Progressing   Problem: Pain Managment: Goal: General experience of comfort will improve and/or be controlled Outcome: Progressing   Problem: Safety: Goal: Ability to remain free from injury will improve Outcome: Progressing   Problem: Skin Integrity: Goal: Risk for impaired skin integrity will decrease Outcome: Progressing   Problem: Education: Goal: Understanding of CV disease, CV risk reduction, and recovery process will improve Outcome: Progressing Goal: Individualized Educational Video(s) Outcome: Progressing   Problem: Activity: Goal: Ability to return to baseline activity level will improve Outcome: Progressing   Problem: Cardiovascular: Goal: Ability to achieve and maintain adequate cardiovascular perfusion will improve Outcome: Progressing Goal: Vascular access site(s) Level 0-1 will be maintained Outcome: Progressing   Problem: Health Behavior/Discharge Planning: Goal: Ability to safely manage health-related needs after discharge will improve Outcome: Progressing   Problem: Education: Goal: Ability to describe self-care measures that may prevent or decrease complications (Diabetes Survival Skills Education) will improve Outcome: Progressing Goal: Individualized Educational Video(s) Outcome: Progressing   Problem: Coping: Goal: Ability to adjust to condition or change in health will improve Outcome:  Progressing   Problem: Fluid Volume: Goal: Ability to maintain a balanced intake and output will improve Outcome: Progressing   Problem: Health Behavior/Discharge Planning: Goal: Ability to identify and utilize available resources and services will improve Outcome: Progressing Goal: Ability to manage health-related needs will improve Outcome: Progressing   Problem: Metabolic: Goal: Ability to maintain appropriate glucose levels will improve Outcome: Progressing   Problem: Nutritional: Goal: Maintenance of adequate nutrition will improve Outcome: Progressing Goal: Progress toward achieving an optimal weight will improve Outcome: Progressing   Problem: Skin Integrity: Goal: Risk for impaired skin  integrity will decrease Outcome: Progressing   Problem: Tissue Perfusion: Goal: Adequacy of tissue perfusion will improve Outcome: Progressing

## 2024-05-08 LAB — CBC
HCT: 35.4 % — ABNORMAL LOW (ref 36.0–46.0)
Hemoglobin: 11.4 g/dL — ABNORMAL LOW (ref 12.0–15.0)
MCH: 27.5 pg (ref 26.0–34.0)
MCHC: 32.2 g/dL (ref 30.0–36.0)
MCV: 85.3 fL (ref 80.0–100.0)
Platelets: 153 K/uL (ref 150–400)
RBC: 4.15 MIL/uL (ref 3.87–5.11)
RDW: 14.6 % (ref 11.5–15.5)
WBC: 11.9 K/uL — ABNORMAL HIGH (ref 4.0–10.5)
nRBC: 0 % (ref 0.0–0.2)

## 2024-05-08 LAB — BASIC METABOLIC PANEL WITH GFR
Anion gap: 7 (ref 5–15)
BUN: 13 mg/dL (ref 6–20)
CO2: 23 mmol/L (ref 22–32)
Calcium: 8.7 mg/dL — ABNORMAL LOW (ref 8.9–10.3)
Chloride: 111 mmol/L (ref 98–111)
Creatinine, Ser: 0.96 mg/dL (ref 0.44–1.00)
GFR, Estimated: >60 mL/min (ref >60)
Glucose, Bld: 98 mg/dL (ref 70–99)
Potassium: 4.2 mmol/L (ref 3.5–5.1)
Sodium: 141 mmol/L (ref 135–145)

## 2024-05-08 LAB — GLUCOSE, CAPILLARY
Glucose-Capillary: 109 mg/dL — ABNORMAL HIGH (ref 70–99)
Glucose-Capillary: 99 mg/dL (ref 70–99)
Glucose-Capillary: 103 mg/dL — ABNORMAL HIGH (ref 70–99)
Glucose-Capillary: 117 mg/dL — ABNORMAL HIGH (ref 70–99)

## 2024-05-08 MED ORDER — STROKE: EARLY STAGES OF RECOVERY BOOK
Freq: Once | Status: AC
  Filled 2024-05-08: qty 1

## 2024-05-08 NOTE — Plan of Care (Signed)
  Problem: Education: Goal: Knowledge of disease or condition will improve Outcome: Progressing Goal: Knowledge of secondary prevention will improve (MUST DOCUMENT ALL) Outcome: Progressing Goal: Knowledge of patient specific risk factors will improve (DELETE if not current risk factor) Outcome: Progressing   Problem: Ischemic Stroke/TIA Tissue Perfusion: Goal: Complications of ischemic stroke/TIA will be minimized Outcome: Progressing   Problem: Coping: Goal: Will verbalize positive feelings about self Outcome: Progressing Goal: Will identify appropriate support needs Outcome: Progressing   Problem: Health Behavior/Discharge Planning: Goal: Ability to manage health-related needs will improve Outcome: Progressing Goal: Goals will be collaboratively established with patient/family Outcome: Progressing   Problem: Self-Care: Goal: Ability to participate in self-care as condition permits will improve Outcome: Progressing Goal: Verbalization of feelings and concerns over difficulty with self-care will improve Outcome: Progressing Goal: Ability to communicate needs accurately will improve Outcome: Progressing   Problem: Nutrition: Goal: Risk of aspiration will decrease Outcome: Progressing Goal: Dietary intake will improve Outcome: Progressing   Problem: Education: Goal: Knowledge of General Education information will improve Description: Including pain rating scale, medication(s)/side effects and non-pharmacologic comfort measures Outcome: Progressing   Problem: Health Behavior/Discharge Planning: Goal: Ability to manage health-related needs will improve Outcome: Progressing   Problem: Clinical Measurements: Goal: Ability to maintain clinical measurements within normal limits will improve Outcome: Progressing Goal: Will remain free from infection Outcome: Progressing Goal: Diagnostic test results will improve Outcome: Progressing Goal: Respiratory complications will  improve Outcome: Progressing Goal: Cardiovascular complication will be avoided Outcome: Progressing   Problem: Nutrition: Goal: Adequate nutrition will be maintained Outcome: Progressing   Problem: Coping: Goal: Level of anxiety will decrease Outcome: Progressing   Problem: Elimination: Goal: Will not experience complications related to bowel motility Outcome: Progressing Goal: Will not experience complications related to urinary retention Outcome: Progressing   Problem: Pain Managment: Goal: General experience of comfort will improve and/or be controlled Outcome: Progressing   Problem: Safety: Goal: Ability to remain free from injury will improve Outcome: Progressing   Problem: Skin Integrity: Goal: Risk for impaired skin integrity will decrease Outcome: Progressing   Problem: Education: Goal: Understanding of CV disease, CV risk reduction, and recovery process will improve Outcome: Progressing Goal: Individualized Educational Video(s) Outcome: Progressing   Problem: Cardiovascular: Goal: Ability to achieve and maintain adequate cardiovascular perfusion will improve Outcome: Progressing Goal: Vascular access site(s) Level 0-1 will be maintained Outcome: Progressing   Problem: Health Behavior/Discharge Planning: Goal: Ability to safely manage health-related needs after discharge will improve Outcome: Progressing   Problem: Education: Goal: Ability to describe self-care measures that may prevent or decrease complications (Diabetes Survival Skills Education) will improve Outcome: Progressing Goal: Individualized Educational Video(s) Outcome: Progressing   Problem: Coping: Goal: Ability to adjust to condition or change in health will improve Outcome: Progressing   Problem: Fluid Volume: Goal: Ability to maintain a balanced intake and output will improve Outcome: Progressing   Problem: Health Behavior/Discharge Planning: Goal: Ability to identify and utilize  available resources and services will improve Outcome: Progressing Goal: Ability to manage health-related needs will improve Outcome: Progressing   Problem: Metabolic: Goal: Ability to maintain appropriate glucose levels will improve Outcome: Progressing   Problem: Nutritional: Goal: Maintenance of adequate nutrition will improve Outcome: Progressing Goal: Progress toward achieving an optimal weight will improve Outcome: Progressing   Problem: Skin Integrity: Goal: Risk for impaired skin integrity will decrease Outcome: Progressing   Problem: Tissue Perfusion: Goal: Adequacy of tissue perfusion will improve Outcome: Progressing

## 2024-05-08 NOTE — Progress Notes (Addendum)
 STROKE TEAM PROGRESS NOTE    SIGNIFICANT HOSPITAL EVENTS 8/21-mechanical thrombectomy with TICI 2b revascularization 8/22- transfer out of ICU, start ASA 325mg    INTERIM HISTORY/SUBJECTIVE TOC following for PCP and medication assistance. Daughters at the bedside. More awake and alert today, states she slept well.  Start xarelto  tomorrow, consider cardiology consult vs outpatient follow up tomorrow to discuss resuming GDMT.  OBJECTIVE  CBC    Component Value Date/Time   WBC 11.9 (H) 05/08/2024 0344   RBC 4.15 05/08/2024 0344   HGB 11.4 (L) 05/08/2024 0344   HCT 35.4 (L) 05/08/2024 0344   PLT 153 05/08/2024 0344   MCV 85.3 05/08/2024 0344   MCH 27.5 05/08/2024 0344   MCHC 32.2 05/08/2024 0344   RDW 14.6 05/08/2024 0344   LYMPHSABS 2.6 05/05/2024 1115   MONOABS 0.6 05/05/2024 1115   EOSABS 0.1 05/05/2024 1115   BASOSABS 0.1 05/05/2024 1115    BMET    Component Value Date/Time   NA 141 05/08/2024 0344   NA 138 04/13/2024 1621   K 4.2 05/08/2024 0344   CL 111 05/08/2024 0344   CO2 23 05/08/2024 0344   GLUCOSE 98 05/08/2024 0344   BUN 13 05/08/2024 0344   BUN 13 04/13/2024 1621   CREATININE 0.96 05/08/2024 0344   CREATININE 0.88 03/07/2016 1459   CALCIUM  8.7 (L) 05/08/2024 0344   EGFR 74 04/13/2024 1621   GFRNONAA >60 05/08/2024 0344   GFRNONAA 78 03/22/2015 1301    IMAGING past 24 hours No results found.   Vitals:   05/08/24 0000 05/08/24 0340 05/08/24 0626 05/08/24 0805  BP: (!) 156/88 (!) 159/86 (!) 157/82 (!) 155/71  Pulse: 82   72  Resp: 18 17  16   Temp: 98.9 F (37.2 C) 98.7 F (37.1 C)  99.2 F (37.3 C)  TempSrc: Oral Oral  Oral  SpO2: 99% 99%  97%  Weight:      Height:         PHYSICAL EXAM General:  Well-nourished, well-developed patient in no acute distress Psych:  Mood and affect appropriate for situation CV: Regular rate and rhythm on monitor Respiratory:  Regular, unlabored respirations on room air GI: Abdomen soft and  nontender   NEURO:  Mental Status: Awake and alert; eyes open, orientated to age, place, time and people. Speech/Language: speech is mildly dysarthric.  Naming and repeating intact, follows simple commands  Cranial Nerves:  II: PERRL. Left lower quadrantanopia with left upper quadrant simultagnosia. Initially slow to open eyes, but eventually does without difficulty. III, IV, VI: EOMI. Eyelids elevate symmetrically.  V: Sensation is intact to light touch and symmetrical to face.  VII: Left facial droop VIII: hearing intact to voice. IX, X: Palate elevates symmetrically. Phonation is normal.  KP:Dynloizm shrug 5/5. XII: tongue protrusion to the left Motor: Right upper and lower extremity 5 out of 5 Left upper and lower extremity with increased muscle tone Left upper extremity 0/5 in left lower extremity 2/5 Tone: is normal and bulk is normal Sensation-diminished sensation on the left with mild left-sided neglect Coordination: FTN intact on the right Gait- deferred  Most Recent NIH 14     ASSESSMENT/PLAN  [name] is a 58 y.o. 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  NIH on Admission 12  Stroke:  right MCA and  ACA scattered infarcts s/p IR with TICI2b, etiology: Likely cardioembolic in the setting of A-fib not compliant with Xarelto   Code Stroke CT head - No acute finding. Redemonstrated chronic infarcts within the right MCA vascular territory and within the cerebellum. CTA head & neck Abrupt occlusion of the right middle cerebral artery mid-to-distal M1 segment. Plaque within the proximal right ICA resulting in less than 50% stenosis, progressed since the CTA of 02/08/2018. Progressive plaque within the proximal left ICA resulting in 65% stenosis. MRI  Acute right MCA and ACA territory infarcts with  petechial hemorrhage  2D Echo 60-65% LDL 109 HgbA1c 6.1 UDS Negative VTE prophylaxis - lovenox  Xarelto  (rivaroxaban ) daily prior to admission, now on aspirin  325 mg daily for 3 days and then re-start xarelto  Monday if exam remains stable  Therapy recommendations:  CIR Disposition:  Pending   Atrial fibrillation Home med: Xarelto -reports noncompliance Rate controlled On digoxin  and metoprolol  AC above Medication compliance education provided  History of stroke 10/2015 admitted for right gaze and left-sided weakness.  Status post TNK.  MRI showed right temporal lobe infarct.  CTA head and neck right M2 occlusion.  CT perfusion negative.  EF 60 to 65%, LDL 89, A1c 6.5.  Patient noncompliant with Xarelto  at that time.  Discharged on Xarelto . 01/2018 admitted for right MCA infarct.  CT head and neck no LVO.  EF 60 to 65%.  LDL 102, A1c 6.1.  Xarelto  continued.  Increase Lipitor  from 20 to 80.  Hypertension Nonischemic cardiomyopathy Combined HFrEF Home meds GDMT: Farxiga , digoxin , Lasix , Entresto , Aldactone -reports noncompliance with meds EF 25-30% in 03/2021 This admission EF 60 to 65% On metoprolol  and spironolactone  BP stable Long-term BP goal normotensive Cardiology consult Monday for GDMT titration  Hyperlipidemia Home med: Crestor  20-reports noncompliance LDL 109, goal < 70 Resume Crestor  20 Continue statin at discharge  Dysphagia Patient has post-stroke dysphagia, SLP consulted On dysphagia 2 and thin liquid Advance diet as tolerated  Other stroke risk factors Medication noncompliance Obesity, BMI 34.84  Other medical issues Leukocytosis WBC 14.2 -> 13.9 ->11.9  Hospital day # 3  Patient seen and examined by NP/APP with MD. MD to update note as needed.   Jorene Last, DNP, FNP-BC [facility] Pager: [phone]   Attending Neurohospitalist Addendum Patient seen and examined with APP/Resident. Agree with the history and physical as documented  above. Agree with the plan as documented, which I helped formulate. I have independently reviewed the chart, obtained history, review of systems and examined the patient.I have personally reviewed pertinent head/neck/spine imaging (CT/MRI). Please feel free to call with any questions.  Sarajane Daring, MD [facility]     To contact Stroke Continuity provider, please refer to [email or web address]. After hours, contact General Neurology

## 2024-05-09 ENCOUNTER — Other Ambulatory Visit (HOSPITAL_COMMUNITY): Payer: Self-pay

## 2024-05-09 ENCOUNTER — Inpatient Hospital Stay (HOSPITAL_COMMUNITY)
Admission: AD | Admit: 2024-05-09 | Discharge: 2024-06-07 | DRG: 057 | Disposition: A | Discharge status: Home Health | Source: Intra-hospital | Attending: Physical Medicine & Rehabilitation | Admitting: Physical Medicine & Rehabilitation

## 2024-05-09 ENCOUNTER — Telehealth (HOSPITAL_COMMUNITY): Payer: Self-pay

## 2024-05-09 DIAGNOSIS — Z8249 Family history of ischemic heart disease and other diseases of the circulatory system: Secondary | ICD-10-CM | POA: Diagnosis not present

## 2024-05-09 DIAGNOSIS — I5032 Chronic diastolic (congestive) heart failure: Secondary | ICD-10-CM

## 2024-05-09 DIAGNOSIS — I2510 Atherosclerotic heart disease of native coronary artery without angina pectoris: Secondary | ICD-10-CM | POA: Diagnosis not present

## 2024-05-09 DIAGNOSIS — I639 Cerebral infarction, unspecified: Secondary | ICD-10-CM | POA: Diagnosis present

## 2024-05-09 DIAGNOSIS — K5900 Constipation, unspecified: Secondary | ICD-10-CM | POA: Diagnosis not present

## 2024-05-09 DIAGNOSIS — Z880 Allergy status to penicillin: Secondary | ICD-10-CM | POA: Diagnosis not present

## 2024-05-09 DIAGNOSIS — F54 Psychological and behavioral factors associated with disorders or diseases classified elsewhere: Secondary | ICD-10-CM | POA: Diagnosis not present

## 2024-05-09 DIAGNOSIS — I69354 Hemiplegia and hemiparesis following cerebral infarction affecting left non-dominant side: Principal | ICD-10-CM

## 2024-05-09 DIAGNOSIS — I110 Hypertensive heart disease with heart failure: Secondary | ICD-10-CM | POA: Diagnosis present

## 2024-05-09 DIAGNOSIS — Z6834 Body mass index (BMI) 34.0-34.9, adult: Secondary | ICD-10-CM | POA: Diagnosis not present

## 2024-05-09 DIAGNOSIS — I63511 Cerebral infarction due to unspecified occlusion or stenosis of right middle cerebral artery: Secondary | ICD-10-CM

## 2024-05-09 DIAGNOSIS — I10 Essential (primary) hypertension: Secondary | ICD-10-CM | POA: Diagnosis not present

## 2024-05-09 DIAGNOSIS — I4891 Unspecified atrial fibrillation: Secondary | ICD-10-CM

## 2024-05-09 DIAGNOSIS — Z91148 Patient's other noncompliance with medication regimen for other reason: Secondary | ICD-10-CM

## 2024-05-09 DIAGNOSIS — E876 Hypokalemia: Secondary | ICD-10-CM | POA: Diagnosis present

## 2024-05-09 DIAGNOSIS — I63421 Cerebral infarction due to embolism of right anterior cerebral artery: Secondary | ICD-10-CM

## 2024-05-09 DIAGNOSIS — G4733 Obstructive sleep apnea (adult) (pediatric): Secondary | ICD-10-CM | POA: Diagnosis not present

## 2024-05-09 DIAGNOSIS — G8111 Spastic hemiplegia affecting right dominant side: Secondary | ICD-10-CM | POA: Diagnosis not present

## 2024-05-09 DIAGNOSIS — E7800 Pure hypercholesterolemia, unspecified: Secondary | ICD-10-CM | POA: Diagnosis present

## 2024-05-09 DIAGNOSIS — Z833 Family history of diabetes mellitus: Secondary | ICD-10-CM

## 2024-05-09 DIAGNOSIS — E66811 Obesity, class 1: Secondary | ICD-10-CM

## 2024-05-09 DIAGNOSIS — F17210 Nicotine dependence, cigarettes, uncomplicated: Secondary | ICD-10-CM | POA: Diagnosis present

## 2024-05-09 DIAGNOSIS — E119 Type 2 diabetes mellitus without complications: Secondary | ICD-10-CM | POA: Diagnosis not present

## 2024-05-09 DIAGNOSIS — R414 Neurologic neglect syndrome: Secondary | ICD-10-CM | POA: Diagnosis not present

## 2024-05-09 DIAGNOSIS — I5020 Unspecified systolic (congestive) heart failure: Secondary | ICD-10-CM

## 2024-05-09 DIAGNOSIS — R600 Localized edema: Secondary | ICD-10-CM | POA: Diagnosis present

## 2024-05-09 DIAGNOSIS — E139 Other specified diabetes mellitus without complications: Secondary | ICD-10-CM | POA: Diagnosis not present

## 2024-05-09 DIAGNOSIS — I4821 Permanent atrial fibrillation: Secondary | ICD-10-CM | POA: Diagnosis present

## 2024-05-09 DIAGNOSIS — R739 Hyperglycemia, unspecified: Secondary | ICD-10-CM | POA: Diagnosis not present

## 2024-05-09 DIAGNOSIS — I272 Pulmonary hypertension, unspecified: Secondary | ICD-10-CM | POA: Diagnosis present

## 2024-05-09 DIAGNOSIS — Z803 Family history of malignant neoplasm of breast: Secondary | ICD-10-CM | POA: Diagnosis not present

## 2024-05-09 DIAGNOSIS — I739 Peripheral vascular disease, unspecified: Secondary | ICD-10-CM | POA: Diagnosis not present

## 2024-05-09 DIAGNOSIS — E785 Hyperlipidemia, unspecified: Secondary | ICD-10-CM

## 2024-05-09 DIAGNOSIS — I428 Other cardiomyopathies: Secondary | ICD-10-CM | POA: Diagnosis not present

## 2024-05-09 DIAGNOSIS — I63411 Cerebral infarction due to embolism of right middle cerebral artery: Secondary | ICD-10-CM | POA: Diagnosis not present

## 2024-05-09 DIAGNOSIS — R32 Unspecified urinary incontinence: Secondary | ICD-10-CM | POA: Diagnosis present

## 2024-05-09 DIAGNOSIS — I63521 Cerebral infarction due to unspecified occlusion or stenosis of right anterior cerebral artery: Principal | ICD-10-CM

## 2024-05-09 DIAGNOSIS — Z5982 Transportation insecurity: Secondary | ICD-10-CM | POA: Diagnosis not present

## 2024-05-09 DIAGNOSIS — I482 Chronic atrial fibrillation, unspecified: Secondary | ICD-10-CM | POA: Diagnosis present

## 2024-05-09 DIAGNOSIS — I69391 Dysphagia following cerebral infarction: Secondary | ICD-10-CM | POA: Diagnosis not present

## 2024-05-09 DIAGNOSIS — D72829 Elevated white blood cell count, unspecified: Secondary | ICD-10-CM | POA: Diagnosis present

## 2024-05-09 DIAGNOSIS — R29713 NIHSS score 13: Secondary | ICD-10-CM | POA: Diagnosis not present

## 2024-05-09 DIAGNOSIS — K5901 Slow transit constipation: Secondary | ICD-10-CM | POA: Diagnosis present

## 2024-05-09 DIAGNOSIS — Z79899 Other long term (current) drug therapy: Secondary | ICD-10-CM

## 2024-05-09 DIAGNOSIS — Z7901 Long term (current) use of anticoagulants: Secondary | ICD-10-CM

## 2024-05-09 DIAGNOSIS — R29810 Facial weakness: Secondary | ICD-10-CM | POA: Diagnosis present

## 2024-05-09 DIAGNOSIS — I4811 Longstanding persistent atrial fibrillation: Secondary | ICD-10-CM | POA: Diagnosis present

## 2024-05-09 DIAGNOSIS — I5042 Chronic combined systolic (congestive) and diastolic (congestive) heart failure: Secondary | ICD-10-CM | POA: Diagnosis not present

## 2024-05-09 DIAGNOSIS — E669 Obesity, unspecified: Secondary | ICD-10-CM | POA: Diagnosis present

## 2024-05-09 DIAGNOSIS — M549 Dorsalgia, unspecified: Secondary | ICD-10-CM | POA: Diagnosis not present

## 2024-05-09 DIAGNOSIS — I69392 Facial weakness following cerebral infarction: Secondary | ICD-10-CM | POA: Diagnosis not present

## 2024-05-09 DIAGNOSIS — G4700 Insomnia, unspecified: Secondary | ICD-10-CM | POA: Diagnosis present

## 2024-05-09 LAB — GLUCOSE, CAPILLARY
Glucose-Capillary: 116 mg/dL — ABNORMAL HIGH (ref 70–99)
Glucose-Capillary: 114 mg/dL — ABNORMAL HIGH (ref 70–99)
Glucose-Capillary: 121 mg/dL — ABNORMAL HIGH (ref 70–99)
Glucose-Capillary: 121 mg/dL — ABNORMAL HIGH (ref 70–99)
Glucose-Capillary: 103 mg/dL — ABNORMAL HIGH (ref 70–99)

## 2024-05-09 LAB — BASIC METABOLIC PANEL WITH GFR
Anion gap: 9 (ref 5–15)
BUN: 11 mg/dL (ref 6–20)
CO2: 23 mmol/L (ref 22–32)
Calcium: 9.3 mg/dL (ref 8.9–10.3)
Chloride: 104 mmol/L (ref 98–111)
Creatinine, Ser: 0.91 mg/dL (ref 0.44–1.00)
GFR, Estimated: >60 mL/min (ref >60)
Glucose, Bld: 110 mg/dL — ABNORMAL HIGH (ref 70–99)
Potassium: 4 mmol/L (ref 3.5–5.1)
Sodium: 136 mmol/L (ref 135–145)

## 2024-05-09 MED ORDER — INSULIN ASPART 100 UNIT/ML IJ SOLN: 0.0000 [IU] | Freq: Three times a day (TID) | INTRAMUSCULAR | Status: DC

## 2024-05-09 MED ORDER — SPIRONOLACTONE 25 MG PO TABS
25.0000 mg | ORAL_TABLET | Freq: Every day | ORAL | Status: DC
  Administered 2024-05-10 – 2024-06-01 (×23): 25 mg via ORAL
  Filled 2024-05-09 (×23): qty 1

## 2024-05-09 MED ORDER — INSULIN ASPART 100 UNIT/ML IJ SOLN: 0.0000 [IU] | Freq: Every day | INTRAMUSCULAR | Status: DC

## 2024-05-09 MED ORDER — RIVAROXABAN 20 MG PO TABS: 20.0000 mg | ORAL_TABLET | Freq: Every day | ORAL | Status: DC

## 2024-05-09 MED ORDER — METOPROLOL SUCCINATE ER 100 MG PO TB24: 200.0000 mg | ORAL_TABLET | Freq: Every day | ORAL | Status: DC

## 2024-05-09 MED ORDER — RIVAROXABAN 20 MG PO TABS
20.0000 mg | ORAL_TABLET | Freq: Every day | ORAL | Status: DC
  Administered 2024-05-09 – 2024-06-06 (×29): 20 mg via ORAL
  Filled 2024-05-09 (×30): qty 1

## 2024-05-09 MED ORDER — TRAZODONE HCL 50 MG PO TABS
25.0000 mg | ORAL_TABLET | Freq: Every evening | ORAL | Status: DC | PRN
  Administered 2024-06-06: 25 mg via ORAL
  Filled 2024-05-09 (×2): qty 1

## 2024-05-09 MED ORDER — ORAL CARE MOUTH RINSE
15.0000 mL | OROMUCOSAL | Status: DC
  Administered 2024-05-09 – 2024-06-07 (×84): 15 mL via OROMUCOSAL

## 2024-05-09 MED ORDER — ALUM & MAG HYDROXIDE-SIMETH 200-200-20 MG/5ML PO SUSP: 30.0000 mL | ORAL | Status: DC | PRN

## 2024-05-09 MED ORDER — METOPROLOL SUCCINATE ER 200 MG PO TB24: 200.0000 mg | ORAL_TABLET | Freq: Every day | ORAL | 0 refills | Status: DC

## 2024-05-09 MED ORDER — ACETAMINOPHEN 325 MG PO TABS
325.0000 mg | ORAL_TABLET | ORAL | Status: DC | PRN
  Administered 2024-05-09 – 2024-06-06 (×6): 650 mg via ORAL
  Filled 2024-05-09 (×7): qty 2

## 2024-05-09 MED ORDER — PROCHLORPERAZINE MALEATE 5 MG PO TABS: 5.0000 mg | ORAL_TABLET | Freq: Four times a day (QID) | ORAL | Status: DC | PRN

## 2024-05-09 MED ORDER — PROCHLORPERAZINE 25 MG RE SUPP: 12.5000 mg | Freq: Four times a day (QID) | RECTAL | Status: DC | PRN

## 2024-05-09 MED ORDER — PROCHLORPERAZINE EDISYLATE 10 MG/2ML IJ SOLN: 5.0000 mg | Freq: Four times a day (QID) | INTRAMUSCULAR | Status: DC | PRN

## 2024-05-09 MED ORDER — SORBITOL 70 % SOLN
30.0000 mL | Freq: Every day | Status: DC | PRN
  Administered 2024-05-19 – 2024-06-05 (×3): 30 mL via ORAL
  Filled 2024-05-09 (×3): qty 30

## 2024-05-09 MED ORDER — DIPHENHYDRAMINE HCL 25 MG PO CAPS: 25.0000 mg | ORAL_CAPSULE | Freq: Four times a day (QID) | ORAL | Status: DC | PRN

## 2024-05-09 MED ORDER — ROSUVASTATIN CALCIUM 20 MG PO TABS
20.0000 mg | ORAL_TABLET | Freq: Every day | ORAL | Status: DC
  Filled 2024-05-09: qty 1

## 2024-05-09 MED ORDER — METOPROLOL SUCCINATE ER 50 MG PO TB24
200.0000 mg | ORAL_TABLET | Freq: Every day | ORAL | Status: DC
  Administered 2024-05-10 – 2024-06-07 (×29): 200 mg via ORAL
  Filled 2024-05-09 (×31): qty 4

## 2024-05-09 MED ORDER — ROSUVASTATIN CALCIUM 20 MG PO TABS: 20.0000 mg | ORAL_TABLET | Freq: Every day | ORAL | 0 refills | Status: DC

## 2024-05-09 MED ORDER — GUAIFENESIN-DM 100-10 MG/5ML PO SYRP: 5.0000 mL | ORAL_SOLUTION | Freq: Four times a day (QID) | ORAL | Status: DC | PRN

## 2024-05-09 MED ORDER — SENNOSIDES-DOCUSATE SODIUM 8.6-50 MG PO TABS: 2.0000 | ORAL_TABLET | Freq: Every evening | ORAL | Status: DC | PRN

## 2024-05-09 MED ORDER — BISACODYL 10 MG RE SUPP: 10.0000 mg | Freq: Every day | RECTAL | Status: DC | PRN

## 2024-05-09 MED ORDER — FLEET ENEMA RE ENEM: 1.0000 | ENEMA | Freq: Once | RECTAL | Status: DC | PRN

## 2024-05-09 MED ORDER — RIVAROXABAN 20 MG PO TABS: 20.0000 mg | ORAL_TABLET | Freq: Every day | ORAL | 0 refills | Status: DC

## 2024-05-09 MED ORDER — ROSUVASTATIN CALCIUM 20 MG PO TABS
40.0000 mg | ORAL_TABLET | Freq: Every day | ORAL | Status: DC
  Administered 2024-05-09 – 2024-06-06 (×29): 40 mg via ORAL
  Filled 2024-05-09 (×28): qty 2

## 2024-05-09 MED ORDER — POLYETHYLENE GLYCOL 3350 17 G PO PACK
17.0000 g | PACK | Freq: Every day | ORAL | Status: DC
  Administered 2024-05-10 – 2024-06-06 (×21): 17 g via ORAL
  Filled 2024-05-09 (×27): qty 1

## 2024-05-09 NOTE — H&P (Signed)
 Physical Medicine and Rehabilitation Admission H&P    Chief Complaint  Patient presents with   Stroke with functional deficits.     HPI:  [name] is a 58 year old female with history of HTN, Type 2 DM, R posterior MCA stroke (2019), R temporoparietal lobe with upper M2 cutoff 2017, and A. Fib on Xarelto  presented to [facility] by EMS as a Code Stroke on 05/05/2024 after acute onset of left-sided weakness while at work. Initial CT of the head was negative, but CTA of the head and neck showed an abrupt occlusion of the right M1 segment plaque with proximal right ICA stenosis, which had progressed since the last CTA in 2019. Neurology was consulted, and she underwent mechanical thrombectomy with TICI 2B revascularization performed by Dr. Rosslyn. The etiology was determined to be embolic, related to her history of atrial fibrillation (AFib) and noncompliance while on Xarelto . An MRI revealed acute right MCA territory infarcts with particular hemorrhage. The patient experienced dysphagia and was evaluated by speech therapy, leading to a recommendation for a dysphagia 2 diet with thin liquids. Prior to this event, she was independent with activities of daily living (ADLs), worked in [employer], drove, and lived in a one-level home with her daughter. Currently, she requires moderate assistance with mobility and basic ADLs. Cardiology has adjusted her medications in order to simplify her regimen.  The patient is currently Mod A with mobility and basic ADLs. Therapy evaluations completed due to patient decreased functional mobility was admitted for a comprehensive rehab program.   Review of Systems  Constitutional:  Negative for chills and fever.  HENT:  Negative for hearing loss.   Eyes:  Negative for blurred vision and double vision.  Respiratory:  Negative for cough and shortness of breath.   Cardiovascular:  Negative for chest pain.  Gastrointestinal:  Positive for  constipation. Negative for abdominal pain, diarrhea, nausea and vomiting.  Genitourinary: Negative.   Musculoskeletal: Negative.   Skin:  Negative for itching.  Neurological:  Positive for sensory change, speech change, weakness and headaches.  Psychiatric/Behavioral:  The patient does not have insomnia.      Past Medical History:  Diagnosis Date   Clotting disorder (HCC)    on xarelto    Essential hypertension    Heart murmur    a. 10/2015 Echo: EF 60-65%, no rwma, mild AI/MR, sev dil LA/RA, PASP .   Noncompliance    NSVT (nonsustained ventricular tachycardia) (HCC)    a. 10/2015 during admission for CVA/AF.   Persistent atrial fibrillation (HCC)    a. CHA2DS2VASC = 4-->Xarelto ;  b. 02/2016 Successful DCCV.  c. Recurrent fib   Pneumonia 2012 x 2; 2015   Stroke [facility])    a. 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   Transient ischemic attack (TIA) 01/2013    Past Surgical History:  Procedure Laterality Date   CARDIOVERSION N/A 02/18/2013   Procedure: CARDIOVERSION;  Surgeon: Jerel Balding, MD;  Location: [facility] ENDOSCOPY;  Service: Cardiovascular;  Laterality: N/A;   CARDIOVERSION N/A 02/23/2013   Procedure: CARDIOVERSION;  Surgeon: Alm LELON Clay, MD;  Location: [facility] OR;  Service: Cardiovascular;  Laterality: N/A;  BESIDE CV   CARDIOVERSION N/A 03/12/2016   Procedure: CARDIOVERSION;  Surgeon: Ezra GORMAN Shuck, MD;  Location: [facility] ENDOSCOPY;  Service: Cardiovascular;  Laterality: N/A;   RADIOLOGY WITH ANESTHESIA N/A 05/05/2024   Procedure: RADIOLOGY WITH ANESTHESIA;  Surgeon: Radiologist, Medication,  MD;  Location: [facility] OR;  Service: Radiology;  Laterality: N/A;   RIGHT/LEFT HEART CATH AND CORONARY ANGIOGRAPHY N/A 04/02/2021   Procedure: RIGHT/LEFT HEART CATH AND CORONARY ANGIOGRAPHY;  Surgeon: Claudene Victory ORN, MD;  Location: [facility] INVASIVE CV LAB;  Service: Cardiovascular;  Laterality: N/A;   TEE WITHOUT  CARDIOVERSION N/A 02/18/2013   Procedure: TRANSESOPHAGEAL ECHOCARDIOGRAM (TEE);  Surgeon: Jerel Balding, MD;  Location: [facility] ENDOSCOPY;  Service: Cardiovascular;  Laterality: N/A;   TUBAL LIGATION  1992   Family History  Problem Relation Age of Onset   Cancer Mother    Atrial fibrillation Mother    Breast cancer Mother    Hypertension Father    Atrial fibrillation Father    Peripheral vascular disease Father    Hypertension Other    Diabetes Other    Heart attack Neg Hx    Stroke Neg Hx    Rectal cancer Neg Hx    Colon cancer Neg Hx    Esophageal cancer Neg Hx    Stomach cancer Neg Hx     Social History:  reports that she quit smoking about 11 years ago. Her smoking use included cigarettes. She started smoking about 14 years ago. She has never used smokeless tobacco. She reports current alcohol use. She reports that she does not use drugs.   Allergies  Allergen Reactions   Penicillins Hives and Other (See Comments)    Unknown  Has patient had a PCN reaction causing immediate rash, facial/tongue/throat swelling, SOB or lightheadedness with hypotension: No Has patient had a PCN reaction causing severe rash involving mucus membranes or skin necrosis: No Has patient had a PCN reaction that required hospitalization No Has patient had a PCN reaction occurring within the last 10 years: No If all of the above answers are NO, then may proceed with Cephalosporin use.    Medications Prior to Admission  Medication Sig Dispense Refill   Accu-Chek FastClix Lancets MISC USE AS INSTRUCTED TO CHECK BLOOD SUGAR ONCE DAILY. 102 each 0   Blood Glucose Monitoring Suppl (ACCU-CHEK GUIDE ME) w/Device KIT 1 kit by Does not apply route daily. 1 kit 0   Continuous Glucose Receiver (FREESTYLE LIBRE 3 READER) DEVI Apply as directed to test blood glucose 1 each 0   Continuous Glucose Sensor (FREESTYLE LIBRE 3 SENSOR) MISC Place 1 sensor on the skin every 14 days. Use to check glucose continuously 3 each  11   dapagliflozin  propanediol (FARXIGA ) 10 MG TABS tablet Take 1 tablet (10 mg total) by mouth daily. (Patient not taking: Reported on 05/05/2024) 90 tablet 1   digoxin  (LANOXIN ) 0.25 MG tablet Take 1 tablet (0.25 mg total) by mouth daily. (Patient not taking: Reported on 05/05/2024) 90 tablet 0   furosemide  (LASIX ) 20 MG tablet Take 2 tablets (40 mg total) by mouth daily.Pt must keep upcoming appt in May 2025 with Dr. Balding before anymore refills. Thank you Final Attempt (Patient not taking: Reported on 05/05/2024) 180 tablet 0   gabapentin  (NEURONTIN ) 300 MG capsule Take 1 capsule (300 mg total) by mouth at bedtime. (Patient not taking: Reported on 05/05/2024) 90 capsule 1   glucose blood test strip Use as instructed 100 strip 0   metoprolol  (TOPROL  XL) 200 MG 24 hr tablet Take 1 tablet (200 mg total) by mouth daily.Pt must keep upcoming appt in May 2025 with Dr. Balding before anymore refills. Thank you Final Attempt (Patient not taking: Reported on 05/05/2024) 90 tablet 0   Misc. Devices MISC Blood pressure monitor.  Diagnosis Hypertension 1 each 0   rivaroxaban  (XARELTO ) 20 MG TABS tablet Take 1 tablet (20 mg total) by mouth daily with supper. (Patient not taking: Reported on 05/05/2024) 90 tablet 1   rosuvastatin  (CRESTOR ) 20 MG tablet Take 1 tablet (20 mg total) by mouth daily. (Patient not taking: Reported on 05/05/2024) 90 tablet 1   sacubitril -valsartan  (ENTRESTO ) 24-26 MG Take 1 tablet by mouth 2 (two) times daily. (Patient not taking: Reported on 05/05/2024) 180 tablet 0   spironolactone  (ALDACTONE ) 25 MG tablet Take 1 tablet (25 mg total) by mouth daily. (Patient not taking: Reported on 05/05/2024) 90 tablet 1   topiramate  (TOPAMAX ) 25 MG tablet Take 1 tablet (25 mg total) by mouth at bedtime. (Patient not taking: Reported on 05/05/2024) 180 tablet 1      Home: Home Living Family/patient expects to be discharged to:: Private residence Living Arrangements:  (lives with daughter) Available  Help at Discharge: Family, Available 24 hours/day (Has 7 children who can make schedules to provide care) Type of Home: House Home Access: Stairs to enter [employer] of Steps: 3 Entrance Stairs-Rails: None Home Layout: One level Bathroom Shower/Tub: Associate Professor: Yes Home Equipment: None  Lives With: Daughter   Functional History: Prior Function Prior Level of Function : Independent/Modified Independent, Driving, Working/employed Mobility Comments: indep, pt states she had no deficits from previous CVAs ADLs Comments: 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, indep with ADLs  Functional Status:  Mobility: Bed Mobility Overal bed mobility: Needs Assistance Bed Mobility: Supine to Sit Supine to sit: +2 for physical assistance, Mod assist General bed mobility comments: up in the recliner Transfers Overall transfer level: Needs assistance Equipment used: 2 person hand held assist Transfers: Sit to/from Stand, Bed to chair/wheelchair/BSC Sit to Stand: Mod assist, +2 physical assistance Bed to/from chair/wheelchair/BSC transfer type:: Stand pivot Stand pivot transfers: Mod assist, +2 physical assistance General transfer comment: assist for power up, rise, steady. Stand x2 from EOB, stand pivot towards R requiring max L knee blocking during RLE stepping. Ambulation/Gait General Gait Details: nt    ADL: ADL Overall ADL's : Needs assistance/impaired Eating/Feeding: Moderate assistance, Sitting Grooming: Minimal assistance, Sitting Upper Body Bathing: Moderate assistance, Sitting Lower Body Bathing: Maximal assistance, Bed level Upper Body Dressing : Maximal assistance, Standing Lower Body Dressing: Maximal assistance, Bed level Toilet Transfer: Maximal assistance, Squat-pivot, BSC/3in1 Toileting- Clothing Manipulation and Hygiene: Total assistance, Sit to/from  stand  Cognition: Cognition Overall Cognitive Status: Impaired/Different from baseline Arousal/Alertness: Awake/alert Orientation Level: Oriented X4 Attention: Focused, Sustained Focused Attention: Appears intact Sustained Attention: Impaired Sustained Attention Impairment: Verbal basic, Functional basic Memory: Appears intact Awareness: Impaired Awareness Impairment: Emergent impairment, Anticipatory impairment Problem Solving: Impaired Problem Solving Impairment: Functional basic Executive Function: Initiating Behaviors: Other (comment) (flat affect) Safety/Judgment: Impaired Cognition Arousal: Alert Behavior During Therapy: Flat affect Overall Cognitive Status: Impaired/Different from baseline  Physical Exam: Blood pressure 134/84, pulse 79, temperature 98.7 F (37.1 C), temperature source Oral, resp. rate 19, height 5' 4 (1.626 m), weight 92.1 kg, SpO2 97%. Physical Exam  General: No apparent distress HEENT: Head is normocephalic, atraumatic, sclera anicteric, oral mucosa pink and moist Neck: Supple without JVD or lymphadenopathy Heart: IIR. No murmurs rubs or gallops Chest: CTA bilaterally without wheezes, rales, or rhonchi; no distress Abdomen: Soft, non-tender, non-distended, bowel sounds positive. Extremities: Trace b/l LE edema Psych: Pt's affect is appropriate. Pt is cooperative Skin: Clean and intact without signs of breakdown Neuro:  Mental Status: AAOx4, fair insight and awareness Speech/Languate: Naming and repetition intact, fluent, follows simple commands,  delayed responses CRANIAL NERVES: II: PERRL III, IV, VI: L neglect, able to cross midline   V: normal sensation bilaterally VII: L facial weakness  VIII: normal hearing to speech IX, X: normal palatal elevation XI: shoulder shrug weak on L  XII: Tongue midline     MOTOR: RUE: 5/5 Deltoid, 5/5 Biceps, 5/5 Triceps,5/5 Grip LUE: 0/5 Deltoid, 0/5 Biceps, 0/5 Triceps, 0/5 Grip RLE: HF 5/5, KE  5/5, ADF 5/5, APF 5/5 LLE: HF 1/5, KE 0/5, ADF 0/5, APF 0/5   No hypertonia noted    SENSORY: Normal to touch all 4 extremities   Coordination: Normal finger to nose on Right    Results for orders placed or performed during the hospital encounter of 05/05/24 (from the past 48 hours)  Glucose, capillary     Status: None   Collection Time: 05/07/24  5:05 PM  Result Value Ref Range   Glucose-Capillary 99 70 - 99 mg/dL    Comment: Glucose reference range applies only to samples taken after fasting for at least 8 hours.  Glucose, capillary     Status: Abnormal   Collection Time: 05/07/24  9:22 PM  Result Value Ref Range   Glucose-Capillary 124 (H) 70 - 99 mg/dL    Comment: Glucose reference range applies only to samples taken after fasting for at least 8 hours.   Comment 1 Notify RN   Basic metabolic panel with GFR     Status: Abnormal   Collection Time: 05/08/24  3:44 AM  Result Value Ref Range   Sodium 141 135 - 145 mmol/L   Potassium 4.2 3.5 - 5.1 mmol/L   Chloride 111 98 - 111 mmol/L   CO2 23 22 - 32 mmol/L   Glucose, Bld 98 70 - 99 mg/dL    Comment: Glucose reference range applies only to samples taken after fasting for at least 8 hours.   BUN 13 6 - 20 mg/dL   Creatinine, Ser 9.03 0.44 - 1.00 mg/dL   Calcium  8.7 (L) 8.9 - 10.3 mg/dL   GFR, Estimated >39 >39 mL/min    Comment: (NOTE) Calculated using the CKD-EPI Creatinine Equation (2021)    Anion gap 7 5 - 15    Comment: Performed at [facility], [address] [place], [place] [address]  CBC     Status: Abnormal   Collection Time: 05/08/24  3:44 AM  Result Value Ref Range   WBC 11.9 (H) 4.0 - 10.5 K/uL   RBC 4.15 3.87 - 5.11 MIL/uL   Hemoglobin 11.4 (L) 12.0 - 15.0 g/dL   HCT 64.5 (L) 63.9 - 53.9 %   MCV 85.3 80.0 - 100.0 fL   MCH 27.5 26.0 - 34.0 pg   MCHC 32.2 30.0 - 36.0 g/dL   RDW 85.3 88.4 - 84.4 %   Platelets 153 150 - 400 K/uL   nRBC 0.0 0.0 - 0.2 %    Comment: Performed at [facility], [address], [place], [place] [address]  Glucose, capillary     Status: Abnormal   Collection Time: 05/08/24  6:25 AM  Result Value Ref Range   Glucose-Capillary 109 (H) 70 - 99 mg/dL    Comment: Glucose reference range applies only to samples taken after fasting for at least 8 hours.   Comment 1 Notify RN   Glucose, capillary     Status: None  Collection Time: 05/08/24 11:54 AM  Result Value Ref Range   Glucose-Capillary 99 70 - 99 mg/dL    Comment: Glucose reference range applies only to samples taken after fasting for at least 8 hours.  Glucose, capillary     Status: Abnormal   Collection Time: 05/08/24  4:51 PM  Result Value Ref Range   Glucose-Capillary 103 (H) 70 - 99 mg/dL    Comment: Glucose reference range applies only to samples taken after fasting for at least 8 hours.  Glucose, capillary     Status: Abnormal   Collection Time: 05/08/24  9:11 PM  Result Value Ref Range   Glucose-Capillary 117 (H) 70 - 99 mg/dL    Comment: Glucose reference range applies only to samples taken after fasting for at least 8 hours.   Comment 1 Notify RN    Comment 2 Document in Chart   Glucose, capillary     Status: Abnormal   Collection Time: 05/09/24  6:09 AM  Result Value Ref Range   Glucose-Capillary 116 (H) 70 - 99 mg/dL    Comment: Glucose reference range applies only to samples taken after fasting for at least 8 hours.   Comment 1 Notify RN    Comment 2 Document in Chart   Glucose, capillary     Status: Abnormal   Collection Time: 05/09/24 11:25 AM  Result Value Ref Range   Glucose-Capillary 114 (H) 70 - 99 mg/dL    Comment: Glucose reference range applies only to samples taken after fasting for at least 8 hours.   No results found.    Blood pressure 134/84, pulse 79, temperature 98.7 F (37.1 C), temperature source Oral, resp. rate 19, height 5' 4 (1.626 m), weight 92.1 kg, SpO2 97%.  Medical Problem List and Plan: 1. Functional deficits secondary to R MCA and ACA  scattered infarcts. Pt is s/p IR w TICI2b. CVA likely cardioembolic  -patient may  shower  -ELOS/Goals: 14-20 days, Min A PT/OT, Sup with SLP   -Admit to CIR 2.  Antithrombotics: -DVT/anticoagulation:  Pharmaceutical: Xarelto   -antiplatelet therapy: N/A 3. Pain Management: Tylenol  prn for pain   --Has been on topamax  for HA in the past.  4. Mood/Behavior/Sleep: LCSW to follow for evaluaton and support.   --trazodone  prn for insomnia.   -antipsychotic agents: N/A 5. Neuropsych/cognition: This patient is capable of making decisions on her own behalf. 6. Skin/Wound Care: Routine pressure relief measures.  7. Fluids/Electrolytes/Nutrition: Monitor I/O. Add CM restrictions to diet  -Post stroke Dysphagia --SLP following  -Constipation: Scheduled miralax  and  prns  8. A fib: Monitor HR TID-- Metoprolol  succinate 200 mg daily  --Continue Xarelto .  9. HTN: Monitor BP TID. On metoprolol  and aldactone  10. NICM w/HFrEF: Discussed with Cardiology Dr. Maceo. Digoxin  and metoprolol  tartrate was stooped. Entresto  not continued. She is continued on spironolactone  and Metoprolol  succinate 200mg  daily in the AM.  11.  Pre-diabetes: On Farxiga  and should help w/BS. Add CM restrictions to diet.  --Monitor BS ac/hs and use SSI tor elevated BS.   12. HLD- LDL 109 crestor  40 mg daily  13. Obesity:  BMI 34.84 educate on diet and weight loss to promote overall health and mobility.  14. Leukocytosis: Trend WBC and monitor for s/s infection WBC 14.2--13.9--11.9    Brandi L Leak, NP 05/09/2024   I have personally performed a face to face diagnostic evaluation of this patient and formulated the key components of the plan.  Additionally, I have personally reviewed laboratory data, imaging  studies, as well as relevant notes and concur with the physician assistant's documentation above.  The patient's status has not changed from the original H&P.  Any changes in documentation from the acute care chart have been  noted above.  Murray Collier, MD

## 2024-05-09 NOTE — TOC Transition Note (Addendum)
 Transition of Care [facility]) - Discharge Note   Patient Details  Name: [name] MRN: [id number] Date of Birth: [birth year]  Transition of [facility]) CM/SW Contact:  Andrez JULIANNA George, RN Phone Number: 05/09/2024, 10:44 AM   Clinical Narrative:     Pt is discharging to CIR today. CM signing off.   SDOH Interventions Today    Flowsheet Row Most Recent Value  SDOH Interventions   Transportation Interventions Inpatient TOC    Pts medicaid transportation information added to her AVS. She will call 2-3 days in advance to schedule rides.   Final next level of care: IP Rehab Facility Barriers to Discharge: No Barriers Identified   Patient Goals and CMS Choice   CMS Medicare.gov Compare Post Acute Care list provided to:: Patient Choice offered to / list presented to : Patient      Discharge Placement                       Discharge Plan and Services Additional resources added to the After Visit Summary for                                       Social Drivers of Health (SDOH) Interventions SDOH Screenings   Food Insecurity: No Food Insecurity (11/18/2023)  Housing: Unknown (11/18/2023)  Transportation Needs: Unmet Transportation Needs (11/18/2023)  Alcohol Screen: Low Risk  (11/18/2023)  Depression (PHQ2-9): Low Risk  (04/13/2024)  Financial Resource Strain: Low Risk  (11/18/2023)  Physical Activity: Unknown (11/18/2023)  Social Connections: Unknown (11/18/2023)  Stress: Stress Concern Present (11/18/2023)  Tobacco Use: Medium Risk (05/05/2024)     Readmission Risk Interventions     No data to display

## 2024-05-09 NOTE — Consult Note (Incomplete)
 Cardiology Consultation   Patient ID: [name] MRN: [id number]; DOB: 22-Jan-1966  Admit date: 05/05/2024 Date of Consult: 05/09/2024  PCP:  Delbert Clam, MD   [facility] Providers Cardiologist:  Jerel Balding, MD   Patient Profile: [name] is a 58 y.o. female with a hx of nonobstructive CAD on cardiac catheterization 03/2021, NICM with recovered EF, permanent atrial fibrillation/flutter, hypertension, hyperlipidemia, type 2 diabetes, prior CVA, severe OSA, obesity who is being seen 05/09/2024 for the evaluation of atrial fibrillation/CHF at the request of neurology.  History of Present Illness: [name] has history of permanent atrial fibrillation.  She has NICM felt to be tachycardia mediated with EF 25 to 30% 03/2021.  Underwent right left heart catheterization showing 60% stenosis of mid LAD, 75% stenosis of distal LCx and 70% stenosis in proximal D1 with mild pulmonary hypertension.  Medical therapy recommended.  EF had recovered 02/2023.  She was last seen in follow-up 10/2022 without any complaints.  Currently patient being evaluated for acute right MCA and ACA scattered infarcts status post IR with TICI2b and petechial hemorrhages.  Felt to be cardioembolic in the setting of noncompliance he with Xarelto .  Rates have been controlled.  Echocardiogram this admission showing preserved EF.  Cardiology asked to see for permanent A-fib and GDMT.  ***   Past Medical History:  Diagnosis Date   Clotting disorder (HCC)    on xarelto    Essential hypertension    Heart murmur    a. 10/2015 Echo: EF 60-65%, no rwma, mild AI/MR, sev dil LA/RA, PASP .   Noncompliance    NSVT (nonsustained ventricular tachycardia) (HCC)    a. 10/2015 during admission for CVA/AF.   Persistent atrial fibrillation (HCC)    a. CHA2DS2VASC = 4-->Xarelto ;  b. 02/2016 Successful DCCV.  c. Recurrent fib   Pneumonia 2012 x 2; 2015   Stroke [facility])    a. 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   Transient ischemic attack (TIA) 01/2013    Past Surgical History:  Procedure Laterality Date   CARDIOVERSION N/A 02/18/2013   Procedure: CARDIOVERSION;  Surgeon: Jerel Balding, MD;  Location: [facility] ENDOSCOPY;  Service: Cardiovascular;  Laterality: N/A;   CARDIOVERSION N/A 02/23/2013   Procedure: CARDIOVERSION;  Surgeon: Alm LELON Clay, MD;  Location: [facility] OR;  Service: Cardiovascular;  Laterality: N/A;  BESIDE CV   CARDIOVERSION N/A 03/12/2016   Procedure: CARDIOVERSION;  Surgeon: Ezra GORMAN Shuck, MD;  Location: [facility] ENDOSCOPY;  Service: Cardiovascular;  Laterality: N/A;   RADIOLOGY WITH ANESTHESIA N/A 05/05/2024   Procedure: RADIOLOGY WITH ANESTHESIA;  Surgeon: Radiologist, Medication, MD;  Location: [facility] OR;  Service: Radiology;  Laterality: N/A;   RIGHT/LEFT HEART CATH AND CORONARY ANGIOGRAPHY N/A 04/02/2021   Procedure: RIGHT/LEFT HEART CATH AND CORONARY ANGIOGRAPHY;  Surgeon: Claudene Victory LELON, MD;  Location: [facility] INVASIVE CV LAB;  Service: Cardiovascular;  Laterality: N/A;   TEE WITHOUT CARDIOVERSION N/A 02/18/2013   Procedure: TRANSESOPHAGEAL ECHOCARDIOGRAM (TEE);  Surgeon: Jerel Balding, MD;  Location: [facility] ENDOSCOPY;  Service: Cardiovascular;  Laterality: N/A;   TUBAL LIGATION  1992    Scheduled Meds:  insulin  aspart  0-15 Units Subcutaneous TID AC & HS   [START ON 05/10/2024] metoprolol  succinate  200 mg Oral Daily   mouth rinse  15 mL Mouth Rinse 4 times per day   rivaroxaban   20 mg Oral Q supper   rosuvastatin   20 mg Oral q1800   spironolactone   25 mg Oral Daily   Continuous Infusions:  PRN Meds: acetaminophen  **OR** acetaminophen  (TYLENOL ) oral liquid 160 mg/5 mL **OR** acetaminophen , hydrALAZINE , labetalol , mouth rinse, senna-docusate  Allergies:    Allergies  Allergen Reactions   Penicillins Hives and Other (See Comments)    Unknown  Has patient had a PCN reaction causing immediate rash,  facial/tongue/throat swelling, SOB or lightheadedness with hypotension: No Has patient had a PCN reaction causing severe rash involving mucus membranes or skin necrosis: No Has patient had a PCN reaction that required hospitalization No Has patient had a PCN reaction occurring within the last 10 years: No If all of the above answers are NO, then may proceed with Cephalosporin use.    Social History:   Social History   Socioeconomic History   Marital status: Single    Spouse name: Not on file   Number of children: Not on file   Years of education: Not on file   Highest education level: Associate degree: occupational, Scientist, product/process development, or vocational program  Occupational History   Occupation: Multimedia programmer: [employer]   Occupation: in home health aide  Tobacco Use   Smoking status: Former    Current packs/day: 0.00    Types: Cigarettes    Start date: 03/15/2010    Quit date: 03/15/2013    Years since quitting: 11.1   Smokeless tobacco: Never  Vaping Use   Vaping status: Never Used  Substance and Sexual Activity   Alcohol use: Yes    Comment: drinks on the weekends,   2-3 beers   Drug use: No   Sexual activity: Not Currently    Birth control/protection: None  Other Topics Concern   Not on file  Social History Narrative   Not on file   Social Drivers of Health   Financial Resource Strain: Low Risk  (11/18/2023)   Overall Financial Resource Strain (CARDIA)    Difficulty of Paying Living Expenses: Not hard at all  Food Insecurity: No Food Insecurity (11/18/2023)   Hunger Vital Sign    Worried About Running Out of Food in the Last Year: Never true    Ran Out of Food in the Last Year: Never true  Transportation Needs: Unmet Transportation Needs (11/18/2023)   PRAPARE - Transportation    Lack of Transportation (Medical): Yes    Lack of Transportation (Non-Medical): Yes  Physical Activity: Unknown (11/18/2023)   Exercise Vital Sign    Days of Exercise per Week: 5 days     Minutes of Exercise per Session: Patient declined  Stress: Stress Concern Present (11/18/2023)   [employer] of Occupational Health - Occupational Stress Questionnaire    Feeling of Stress : Rather much  Social Connections: Unknown (11/18/2023)   Social Connection and Isolation Panel    Frequency of Communication with Friends and Family: Patient declined    Frequency of Social Gatherings with Friends and Family: Patient declined    Attends Religious Services: Patient declined    Database administrator or Organizations: No    Attends Engineer, structural: Not on file    Marital Status: Never married  Intimate Partner Violence: Unknown (12/17/2021)   Received from [facility]   HITS    Physically Hurt: Not on file    Insult or Talk Down To: Not on file    Threaten Physical Harm: Not on file    Scream or Curse: Not on file    Family History:   Family History  Problem Relation  Age of Onset   Cancer Mother    Atrial fibrillation Mother    Breast cancer Mother    Hypertension Father    Atrial fibrillation Father    Peripheral vascular disease Father    Hypertension Other    Diabetes Other    Heart attack Neg Hx    Stroke Neg Hx    Rectal cancer Neg Hx    Colon cancer Neg Hx    Esophageal cancer Neg Hx    Stomach cancer Neg Hx      ROS:  Please see the history of present illness. All other ROS reviewed and negative.     Physical Exam/Data: Vitals:   05/09/24 0743 05/09/24 0802 05/09/24 1125 05/09/24 1619  BP: (!) 173/86 (!) 148/85 134/84 125/72  Pulse: 97 76 79 77  Resp: 19  19 16   Temp: 98.5 F (36.9 C)  98.7 F (37.1 C) 98.4 F (36.9 C)  TempSrc: Oral  Oral Oral  SpO2: 96% 99% 97% 98%  Weight:      Height:        Intake/Output Summary (Last 24 hours) at 05/09/2024 1644 Last data filed at 05/09/2024 1500 Gross per 24 hour  Intake --  Output 1700 ml  Net -1700 ml      05/05/2024   11:11 AM 05/05/2024   11:00 AM 04/13/2024    3:15 PM  Last 3  Weights  Weight (lbs) 203 lb 203 lb 11.3 oz 198 lb  Weight (kg) 92.08 kg 92.4 kg 89.812 kg     Body mass index is 34.84 kg/m.  General:  Well nourished, well developed, in no acute distress*** HEENT: normal Neck: no JVD Vascular: No carotid bruits; Distal pulses 2+ bilaterally Cardiac:  normal S1, S2; RRR; no murmur *** Lungs:  clear to auscultation bilaterally, no wheezing, rhonchi or rales  Abd: soft, nontender, no hepatomegaly  Ext: no edema Musculoskeletal:  No deformities, BUE and BLE strength normal and equal Skin: warm and dry  Neuro:  CNs 2-12 intact, no focal abnormalities noted Psych:  Normal affect   EKG:  The EKG was personally reviewed and demonstrates:  no EKG this admission. Telemetry:  Telemetry was personally reviewed and demonstrates: Atrial fibrillation  Relevant CV Studies: Echocardiogram 05/05/2024  1. Left ventricular ejection fraction, by estimation, is 60 to 65%. The  left ventricle has normal function. The left ventricle has no regional  wall motion abnormalities. There is mild concentric left ventricular  hypertrophy. Left ventricular diastolic  function could not be evaluated.   2. Right ventricular systolic function is normal. The right ventricular  size is normal. There is mildly elevated pulmonary artery systolic  pressure. The estimated right ventricular systolic pressure is 41.0 mmHg.   3. Left atrial size was moderately dilated.   4. Right atrial size was moderately dilated.   5. The mitral valve is normal in structure. Trivial mitral valve  regurgitation. No evidence of mitral stenosis.   6. Tricuspid valve regurgitation is mild to moderate.   7. The aortic valve is tricuspid. There is mild calcification of the  aortic valve. Aortic valve regurgitation is mild. No aortic stenosis is  present.   8. The inferior vena cava is normal in size with greater than 50%  respiratory variability, suggesting right atrial pressure of 3 mmHg.     Laboratory Data: High Sensitivity Troponin:  No results for input(s): TROPONINIHS in the last 720 hours.   Chemistry Recent Labs  Lab 05/06/24 0559 05/07/24 9392 05/08/24  0344  NA 141 139 141  K 4.0 3.8 4.2  CL 111 110 111  CO2 23 24 23   GLUCOSE 135* 105* 98  BUN 10 12 13   CREATININE 0.87 0.90 0.96  CALCIUM  9.0 8.8* 8.7*  GFRNONAA >60 >60 >60  ANIONGAP 7 5 7     Recent Labs  Lab 05/05/24 1115 05/06/24 0559  PROT 7.6 7.1  ALBUMIN  3.6 3.3*  AST 33 22  ALT 26 20  ALKPHOS 77 65  BILITOT 0.5 0.7   Lipids  Recent Labs  Lab 05/06/24 0559  CHOL 152  TRIG 39  HDL 35*  LDLCALC 109*  CHOLHDL 4.3    Hematology Recent Labs  Lab 05/06/24 0559 05/07/24 0607 05/08/24 0344  WBC 14.2* 13.9* 11.9*  RBC 4.32 4.05 4.15  HGB 11.6* 11.0* 11.4*  HCT 36.0 34.2* 35.4*  MCV 83.3 84.4 85.3  MCH 26.9 27.2 27.5  MCHC 32.2 32.2 32.2  RDW 13.9 14.2 14.6  PLT 159 159 153   Thyroid No results for input(s): TSH, FREET4 in the last 168 hours.  BNPNo results for input(s): BNP, PROBNP in the last 168 hours.  DDimer No results for input(s): DDIMER in the last 168 hours.  Radiology/Studies:  MR BRAIN WO CONTRAST Result Date: 05/06/2024 CLINICAL DATA:  Stroke, follow up EXAM: MRI HEAD WITHOUT CONTRAST TECHNIQUE: Multiplanar, multiecho pulse sequences of the brain and surrounding structures were obtained without intravenous contrast. COMPARISON:  Same day CT head. FINDINGS: Brain: Multifocal acute right MCA territory infarcts including infarcts in the right basal ganglia, right frontal and right parietal lobes. Associated edema without mass effect. No midline shift. Areas of petechial hemorrhage in the right parietal lobe. Small additional acute infarct in the splenium of the corpus callosum. No mass occupying acute hemorrhage. No hydrocephalus. No mass lesion. Remote left cerebellar infarcts. Patchy T2/FLAIR hyperintensities in the white matter, compatible with chronic  microvascular ischemic disease. Vascular: Better evaluated on recent catheter arteriogram and CTA. Skull and upper cervical spine: Normal marrow signal. Sinuses/Orbits: Clear sinuses.  No acute orbital findings. IMPRESSION: Acute right MCA territory infarcts with petechial hemorrhage, as described above. No mass effect. Electronically Signed   By: Gilmore GORMAN Molt M.D.   On: 05/06/2024 00:55   ECHOCARDIOGRAM COMPLETE Result Date: 05/05/2024    ECHOCARDIOGRAM REPORT   Patient Name:   TYREONA PANJWANI Date of Exam: 05/05/2024 Medical Rec #:  [id number]     Height:       64.0 in Accession #:    [id number]    Weight:       203.0 lb Date of Birth:  June 16, 1966      BSA:          1.969 m Patient Age:    57 years      BP:           126/76 mmHg Patient Gender: F             HR:           95 bpm. Exam Location:  Inpatient Procedure: 2D Echo, Cardiac Doppler and Color Doppler (Both Spectral and Color            Flow Doppler were utilized during procedure). Indications:    Stroke I63.9  History:        Patient has prior history of Echocardiogram examinations, most                 recent 02/27/2023. CHF, Angina, Stroke and TIA, Arrythmias:Atrial  Fibrillation and Tachycardia; Risk Factors:Hypertension, Sleep                 Apnea, Diabetes, Current Smoker and Dyslipidemia.  Sonographer:    Thea Norlander RCS Referring Phys: ERIN C LEHNER IMPRESSIONS  1. Left ventricular ejection fraction, by estimation, is 60 to 65%. The left ventricle has normal function. The left ventricle has no regional wall motion abnormalities. There is mild concentric left ventricular hypertrophy. Left ventricular diastolic function could not be evaluated.  2. Right ventricular systolic function is normal. The right ventricular size is normal. There is mildly elevated pulmonary artery systolic pressure. The estimated right ventricular systolic pressure is 41.0 mmHg.  3. Left atrial size was moderately dilated.  4. Right atrial size was  moderately dilated.  5. The mitral valve is normal in structure. Trivial mitral valve regurgitation. No evidence of mitral stenosis.  6. Tricuspid valve regurgitation is mild to moderate.  7. The aortic valve is tricuspid. There is mild calcification of the aortic valve. Aortic valve regurgitation is mild. No aortic stenosis is present.  8. The inferior vena cava is normal in size with greater than 50% respiratory variability, suggesting right atrial pressure of 3 mmHg. FINDINGS  Left Ventricle: Left ventricular ejection fraction, by estimation, is 60 to 65%. The left ventricle has normal function. The left ventricle has no regional wall motion abnormalities. The left ventricular internal cavity size was normal in size. There is  mild concentric left ventricular hypertrophy. Left ventricular diastolic function could not be evaluated due to atrial fibrillation. Left ventricular diastolic function could not be evaluated. Right Ventricle: The right ventricular size is normal. No increase in right ventricular wall thickness. Right ventricular systolic function is normal. There is mildly elevated pulmonary artery systolic pressure. The tricuspid regurgitant velocity is 3.00  m/s, and with an assumed right atrial pressure of 5 mmHg, the estimated right ventricular systolic pressure is 41.0 mmHg. Left Atrium: Left atrial size was moderately dilated. Right Atrium: Right atrial size was moderately dilated. Pericardium: There is no evidence of pericardial effusion. Mitral Valve: The mitral valve is normal in structure. Mild mitral annular calcification. Trivial mitral valve regurgitation. No evidence of mitral valve stenosis. Tricuspid Valve: The tricuspid valve is normal in structure. Tricuspid valve regurgitation is mild to moderate. No evidence of tricuspid stenosis. Aortic Valve: The aortic valve is tricuspid. There is mild calcification of the aortic valve. Aortic valve regurgitation is mild. No aortic stenosis is  present. Aortic valve peak gradient measures 6.2 mmHg. Pulmonic Valve: The pulmonic valve was normal in structure. Pulmonic valve regurgitation is not visualized. No evidence of pulmonic stenosis. Aorta: The aortic root is normal in size and structure. Venous: The inferior vena cava is normal in size with greater than 50% respiratory variability, suggesting right atrial pressure of 3 mmHg. IAS/Shunts: No atrial level shunt detected by color flow Doppler.  LEFT VENTRICLE PLAX 2D LVIDd:         3.70 cm   Diastology LVIDs:         2.50 cm   LV e' medial:    6.16 cm/s LV PW:         1.20 cm   LV E/e' medial:  16.4 LV IVS:        1.30 cm   LV e' lateral:   12.40 cm/s LVOT diam:     2.00 cm   LV E/e' lateral: 8.1 LV SV:         44 LV SV Index:  22 LVOT Area:     3.14 cm  RIGHT VENTRICLE            IVC RV S prime:     8.45 cm/s  IVC diam: 2.60 cm TAPSE (M-mode): 1.5 cm LEFT ATRIUM           Index        RIGHT ATRIUM           Index LA diam:      3.50 cm 1.78 cm/m   RA Area:     18.70 cm LA Vol (A2C): 55.4 ml 28.11 ml/m  RA Volume:   46.30 ml  23.52 ml/m LA Vol (A4C): 52.7 ml 26.77 ml/m  AORTIC VALVE AV Area (Vmax): 2.50 cm AV Vmax:        124.00 cm/s AV Peak Grad:   6.2 mmHg LVOT Vmax:      98.50 cm/s LVOT Vmean:     59.200 cm/s LVOT VTI:       0.140 m  AORTA Ao Root diam: 3.00 cm Ao Asc diam:  3.40 cm MITRAL VALVE                TRICUSPID VALVE MV Area (PHT): 5.75 cm     TR Peak grad:   36.0 mmHg MV Decel Time: 132 msec     TR Vmax:        300.00 cm/s MV E velocity: 101.00 cm/s                             SHUNTS                             Systemic VTI:  0.14 m                             Systemic Diam: 2.00 cm Toribio Fuel MD Electronically signed by Toribio Fuel MD Signature Date/Time: 05/05/2024/6:12:30 PM    Final      Assessment and Plan:  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  LHC with nonobstructive CAD. - EF recovered 02/2023 EF this admission  continues to show preserved biventricular function.  Mild concentric LVH, mildly elevated PASP, RVSP 41.  Mild to moderate TR, mild AR.  Euvolemic. GDMT: Start Toprol -XL 200 mg, spironolactone  25 mg.  BP has been labile here so will stop Entresto  and has had more issues with compliancy in financial restrictions.  Farxiga  also stopped. With more accurate blood pressures and has persistent hypertension would add back losartan  as this is more affordable. No need for diuretic currently.  Permanent atrial fibrillation Heart rates controlled less than 110.  Moderately dilated atria. Noncompliant with Xarelto  20mg  and with acute CVA here.  Encouraged better compliance. Start Toprol  XL 200 mg daily starting tomorrow.  If she develops RVR again may have to add back digoxin .   Acute CVA Likely cardioembolic and in the setting of noncompliance he with DOAC. Right MCA and ACA scattered infarcts s/p IR with TICI2b.  OSA Significant contributor to her permanent atrial fibrillation.  Would encourage CPAP compliancy if not already on it.   Risk Assessment/Risk Scores:  New [place]  Heart Association (NYHA) Functional Class NYHA Class II  CHA2DS2-VASc Score = 6  This indicates a 9.7% annual risk of stroke. The patient's score is based upon: CHF History:  1 HTN History: 1 Diabetes History: 0 Stroke History: 2 Vascular Disease History: 1 Age Score: 0 Gender Score: 1   For questions or updates, please contact [facility] Please consult [email or web address] for contact info under    Signed, Thom LITTIE Sluder, PA-C  05/09/2024 4:44 PM

## 2024-05-09 NOTE — H&P (Signed)
 Physical Medicine and Rehabilitation Admission H&P        Chief Complaint  Patient presents with   Stroke with functional deficits.       HPI:  [name] is a 58 year old female with history of HTN, Type 2 DM, R posterior MCA stroke (2019), R temporoparietal lobe with upper M2 cutoff 2017, and A. Fib on Xarelto  presented to [facility] by EMS as a Code Stroke on 05/05/2024 after acute onset of left-sided weakness while at work. Initial CT of the head was negative, but CTA of the head and neck showed an abrupt occlusion of the right M1 segment plaque with proximal right ICA stenosis, which had progressed since the last CTA in 2019. Neurology was consulted, and she underwent mechanical thrombectomy with TICI 2B revascularization performed by Dr. Rosslyn. The etiology was determined to be embolic, related to her history of atrial fibrillation (AFib) and noncompliance while on Xarelto . An MRI revealed acute right MCA territory infarcts with particular hemorrhage. The patient experienced dysphagia and was evaluated by speech therapy, leading to a recommendation for a dysphagia 2 diet with thin liquids. Prior to this event, she was independent with activities of daily living (ADLs), worked in [employer], drove, and lived in a one-level home with her daughter. Currently, she requires moderate assistance with mobility and basic ADLs. Cardiology has adjusted her medications in order to simplify her regimen.  The patient is currently Mod A with mobility and basic ADLs. Therapy evaluations completed due to patient decreased functional mobility was admitted for a comprehensive rehab program.     Review of Systems  Constitutional:  Negative for chills and fever.  HENT:  Negative for hearing loss.   Eyes:  Negative for blurred vision and double vision.  Respiratory:  Negative for cough and shortness of breath.   Cardiovascular:  Negative for chest pain.  Gastrointestinal:  Positive  for constipation. Negative for abdominal pain, diarrhea, nausea and vomiting.  Genitourinary: Negative.   Musculoskeletal: Negative.   Skin:  Negative for itching.  Neurological:  Positive for sensory change, speech change, weakness and headaches.  Psychiatric/Behavioral:  The patient does not have insomnia.            Past Medical History:  Diagnosis Date   Clotting disorder (HCC)      on xarelto    Essential hypertension     Heart murmur      a. 10/2015 Echo: EF 60-65%, no rwma, mild AI/MR, sev dil LA/RA, PASP .   Noncompliance     NSVT (nonsustained ventricular tachycardia) (HCC)      a. 10/2015 during admission for CVA/AF.   Persistent atrial fibrillation (HCC)      a. CHA2DS2VASC = 4-->Xarelto ;  b. 02/2016 Successful DCCV.  c. Recurrent fib   Pneumonia 2012 x 2; 2015   Stroke [facility]      a. 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   Transient ischemic attack (TIA) 01/2013               Past Surgical History:  Procedure Laterality Date   CARDIOVERSION N/A 02/18/2013    Procedure: CARDIOVERSION;  Surgeon: Jerel Balding, MD;  Location: [facility] ENDOSCOPY;  Service: Cardiovascular;  Laterality: N/A;   CARDIOVERSION N/A 02/23/2013    Procedure: CARDIOVERSION;  Surgeon: Alm LELON Clay, MD;  Location: [facility] OR;  Service: Cardiovascular;  Laterality: N/A;  BESIDE CV  CARDIOVERSION N/A 03/12/2016    Procedure: CARDIOVERSION;  Surgeon: Ezra GORMAN Shuck, MD;  Location: [facility] ENDOSCOPY;  Service: Cardiovascular;  Laterality: N/A;   RADIOLOGY WITH ANESTHESIA N/A 05/05/2024    Procedure: RADIOLOGY WITH ANESTHESIA;  Surgeon: Radiologist, Medication, MD;  Location: [facility] OR;  Service: Radiology;  Laterality: N/A;   RIGHT/LEFT HEART CATH AND CORONARY ANGIOGRAPHY N/A 04/02/2021    Procedure: RIGHT/LEFT HEART CATH AND CORONARY ANGIOGRAPHY;  Surgeon: Claudene Victory ORN, MD;  Location: [facility] INVASIVE CV LAB;  Service:  Cardiovascular;  Laterality: N/A;   TEE WITHOUT CARDIOVERSION N/A 02/18/2013    Procedure: TRANSESOPHAGEAL ECHOCARDIOGRAM (TEE);  Surgeon: Jerel Balding, MD;  Location: [facility] ENDOSCOPY;  Service: Cardiovascular;  Laterality: N/A;   TUBAL LIGATION   1992             Family History  Problem Relation Age of Onset   Cancer Mother     Atrial fibrillation Mother     Breast cancer Mother     Hypertension Father     Atrial fibrillation Father     Peripheral vascular disease Father     Hypertension Other     Diabetes Other     Heart attack Neg Hx     Stroke Neg Hx     Rectal cancer Neg Hx     Colon cancer Neg Hx     Esophageal cancer Neg Hx     Stomach cancer Neg Hx            Social History:  reports that she quit smoking about 11 years ago. Her smoking use included cigarettes. She started smoking about 14 years ago. She has never used smokeless tobacco. She reports current alcohol use. She reports that she does not use drugs.     Allergies       Allergies  Allergen Reactions   Penicillins Hives and Other (See Comments)      Unknown   Has patient had a PCN reaction causing immediate rash, facial/tongue/throat swelling, SOB or lightheadedness with hypotension: No Has patient had a PCN reaction causing severe rash involving mucus membranes or skin necrosis: No Has patient had a PCN reaction that required hospitalization No Has patient had a PCN reaction occurring within the last 10 years: No If all of the above answers are NO, then may proceed with Cephalosporin use.              Medications Prior to Admission  Medication Sig Dispense Refill   Accu-Chek FastClix Lancets MISC USE AS INSTRUCTED TO CHECK BLOOD SUGAR ONCE DAILY. 102 each 0   Blood Glucose Monitoring Suppl (ACCU-CHEK GUIDE ME) w/Device KIT 1 kit by Does not apply route daily. 1 kit 0   Continuous Glucose Receiver (FREESTYLE LIBRE 3 READER) DEVI Apply as directed to test blood glucose 1 each 0   Continuous Glucose  Sensor (FREESTYLE LIBRE 3 SENSOR) MISC Place 1 sensor on the skin every 14 days. Use to check glucose continuously 3 each 11   dapagliflozin  propanediol (FARXIGA ) 10 MG TABS tablet Take 1 tablet (10 mg total) by mouth daily. (Patient not taking: Reported on 05/05/2024) 90 tablet 1   digoxin  (LANOXIN ) 0.25 MG tablet Take 1 tablet (0.25 mg total) by mouth daily. (Patient not taking: Reported on 05/05/2024) 90 tablet 0   furosemide  (LASIX ) 20 MG tablet Take 2 tablets (40 mg total) by mouth daily.Pt must keep upcoming appt in May 2025 with Dr. Balding before anymore refills. Thank you Final Attempt (Patient not taking: Reported  on 05/05/2024) 180 tablet 0   gabapentin  (NEURONTIN ) 300 MG capsule Take 1 capsule (300 mg total) by mouth at bedtime. (Patient not taking: Reported on 05/05/2024) 90 capsule 1   glucose blood test strip Use as instructed 100 strip 0   metoprolol  (TOPROL  XL) 200 MG 24 hr tablet Take 1 tablet (200 mg total) by mouth daily.Pt must keep upcoming appt in May 2025 with Dr. Francyne before anymore refills. Thank you Final Attempt (Patient not taking: Reported on 05/05/2024) 90 tablet 0   Misc. Devices MISC Blood pressure monitor.  Diagnosis Hypertension 1 each 0   rivaroxaban  (XARELTO ) 20 MG TABS tablet Take 1 tablet (20 mg total) by mouth daily with supper. (Patient not taking: Reported on 05/05/2024) 90 tablet 1   rosuvastatin  (CRESTOR ) 20 MG tablet Take 1 tablet (20 mg total) by mouth daily. (Patient not taking: Reported on 05/05/2024) 90 tablet 1   sacubitril -valsartan  (ENTRESTO ) 24-26 MG Take 1 tablet by mouth 2 (two) times daily. (Patient not taking: Reported on 05/05/2024) 180 tablet 0   spironolactone  (ALDACTONE ) 25 MG tablet Take 1 tablet (25 mg total) by mouth daily. (Patient not taking: Reported on 05/05/2024) 90 tablet 1   topiramate  (TOPAMAX ) 25 MG tablet Take 1 tablet (25 mg total) by mouth at bedtime. (Patient not taking: Reported on 05/05/2024) 180 tablet 1               Home: Home Living Family/patient expects to be discharged to:: Private residence Living Arrangements:  (lives with daughter) Available Help at Discharge: Family, Available 24 hours/day (Has 7 children who can make schedules to provide care) Type of Home: House Home Access: Stairs to enter [employer] of Steps: 3 Entrance Stairs-Rails: None Home Layout: One level Bathroom Shower/Tub: Associate Professor: Yes Home Equipment: None  Lives With: Daughter   Functional History: Prior Function Prior Level of Function : Independent/Modified Independent, Driving, Working/employed Mobility Comments: indep, pt states she had no deficits from previous CVAs ADLs Comments: 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, indep with ADLs   Functional Status:  Mobility: Bed Mobility Overal bed mobility: Needs Assistance Bed Mobility: Supine to Sit Supine to sit: +2 for physical assistance, Mod assist General bed mobility comments: up in the recliner Transfers Overall transfer level: Needs assistance Equipment used: 2 person hand held assist Transfers: Sit to/from Stand, Bed to chair/wheelchair/BSC Sit to Stand: Mod assist, +2 physical assistance Bed to/from chair/wheelchair/BSC transfer type:: Stand pivot Stand pivot transfers: Mod assist, +2 physical assistance General transfer comment: assist for power up, rise, steady. Stand x2 from EOB, stand pivot towards R requiring max L knee blocking during RLE stepping. Ambulation/Gait General Gait Details: nt   ADL: ADL Overall ADL's : Needs assistance/impaired Eating/Feeding: Moderate assistance, Sitting Grooming: Minimal assistance, Sitting Upper Body Bathing: Moderate assistance, Sitting Lower Body Bathing: Maximal assistance, Bed level Upper Body Dressing : Maximal assistance, Standing Lower Body Dressing: Maximal assistance, Bed level Toilet Transfer: Maximal  assistance, Squat-pivot, BSC/3in1 Toileting- Clothing Manipulation and Hygiene: Total assistance, Sit to/from stand   Cognition: Cognition Overall Cognitive Status: Impaired/Different from baseline Arousal/Alertness: Awake/alert Orientation Level: Oriented X4 Attention: Focused, Sustained Focused Attention: Appears intact Sustained Attention: Impaired Sustained Attention Impairment: Verbal basic, Functional basic Memory: Appears intact Awareness: Impaired Awareness Impairment: Emergent impairment, Anticipatory impairment Problem Solving: Impaired Problem Solving Impairment: Functional basic Executive Function: Initiating Behaviors: Other (comment) (flat affect) Safety/Judgment: Impaired Cognition Arousal: Alert Behavior During Therapy: Flat affect Overall Cognitive  Status: Impaired/Different from baseline   Physical Exam: Blood pressure 134/84, pulse 79, temperature 98.7 F (37.1 C), temperature source Oral, resp. rate 19, height 5' 4 (1.626 m), weight 92.1 kg, SpO2 97%. Physical Exam   General: No apparent distress HEENT: Head is normocephalic, atraumatic, sclera anicteric, oral mucosa pink and moist Neck: Supple without JVD or lymphadenopathy Heart: IIR. No murmurs rubs or gallops Chest: CTA bilaterally without wheezes, rales, or rhonchi; no distress Abdomen: Soft, non-tender, non-distended, bowel sounds positive. Extremities: Trace b/l LE edema Psych: Pt's affect is appropriate. Pt is cooperative Skin: Clean and intact without signs of breakdown Neuro:     Mental Status: AAOx4, fair insight and awareness Speech/Languate: Naming and repetition intact, fluent, follows simple commands,  delayed responses CRANIAL NERVES: II: PERRL III, IV, VI: L neglect, able to cross midline   V: normal sensation bilaterally VII: L facial weakness  VIII: normal hearing to speech IX, X: normal palatal elevation XI: shoulder shrug weak on L  XII: Tongue midline     MOTOR: RUE:  5/5 Deltoid, 5/5 Biceps, 5/5 Triceps,5/5 Grip LUE: 0/5 Deltoid, 0/5 Biceps, 0/5 Triceps, 0/5 Grip RLE: HF 5/5, KE 5/5, ADF 5/5, APF 5/5 LLE: HF 1/5, KE 0/5, ADF 0/5, APF 0/5   No hypertonia noted    SENSORY: Normal to touch all 4 extremities   Coordination: Normal finger to nose on Right      Lab Results Last 48 Hours        Results for orders placed or performed during the hospital encounter of 05/05/24 (from the past 48 hours)  Glucose, capillary     Status: None    Collection Time: 05/07/24  5:05 PM  Result Value Ref Range    Glucose-Capillary 99 70 - 99 mg/dL      Comment: Glucose reference range applies only to samples taken after fasting for at least 8 hours.  Glucose, capillary     Status: Abnormal    Collection Time: 05/07/24  9:22 PM  Result Value Ref Range    Glucose-Capillary 124 (H) 70 - 99 mg/dL      Comment: Glucose reference range applies only to samples taken after fasting for at least 8 hours.    Comment 1 Notify RN    Basic metabolic panel with GFR     Status: Abnormal    Collection Time: 05/08/24  3:44 AM  Result Value Ref Range    Sodium 141 135 - 145 mmol/L    Potassium 4.2 3.5 - 5.1 mmol/L    Chloride 111 98 - 111 mmol/L    CO2 23 22 - 32 mmol/L    Glucose, Bld 98 70 - 99 mg/dL      Comment: Glucose reference range applies only to samples taken after fasting for at least 8 hours.    BUN 13 6 - 20 mg/dL    Creatinine, Ser 9.03 0.44 - 1.00 mg/dL    Calcium  8.7 (L) 8.9 - 10.3 mg/dL    GFR, Estimated >39 >39 mL/min      Comment: (NOTE) Calculated using the CKD-EPI Creatinine Equation (2021)      Anion gap 7 5 - 15      Comment: Performed at [facility], [address] [place], [place] [address]  CBC     Status: Abnormal    Collection Time: 05/08/24  3:44 AM  Result Value Ref Range    WBC 11.9 (H) 4.0 - 10.5 K/uL    RBC 4.15 3.87 -  5.11 MIL/uL    Hemoglobin 11.4 (L) 12.0 - 15.0 g/dL    HCT 64.5 (L) 63.9 - 46.0 %    MCV 85.3 80.0 - 100.0 fL     MCH 27.5 26.0 - 34.0 pg    MCHC 32.2 30.0 - 36.0 g/dL    RDW 85.3 88.4 - 84.4 %    Platelets 153 150 - 400 K/uL    nRBC 0.0 0.0 - 0.2 %      Comment: Performed at [facility], [address], [place], [place] [address]  Glucose, capillary     Status: Abnormal    Collection Time: 05/08/24  6:25 AM  Result Value Ref Range    Glucose-Capillary 109 (H) 70 - 99 mg/dL      Comment: Glucose reference range applies only to samples taken after fasting for at least 8 hours.    Comment 1 Notify RN    Glucose, capillary     Status: None    Collection Time: 05/08/24 11:54 AM  Result Value Ref Range    Glucose-Capillary 99 70 - 99 mg/dL      Comment: Glucose reference range applies only to samples taken after fasting for at least 8 hours.  Glucose, capillary     Status: Abnormal    Collection Time: 05/08/24  4:51 PM  Result Value Ref Range    Glucose-Capillary 103 (H) 70 - 99 mg/dL      Comment: Glucose reference range applies only to samples taken after fasting for at least 8 hours.  Glucose, capillary     Status: Abnormal    Collection Time: 05/08/24  9:11 PM  Result Value Ref Range    Glucose-Capillary 117 (H) 70 - 99 mg/dL      Comment: Glucose reference range applies only to samples taken after fasting for at least 8 hours.    Comment 1 Notify RN      Comment 2 Document in Chart    Glucose, capillary     Status: Abnormal    Collection Time: 05/09/24  6:09 AM  Result Value Ref Range    Glucose-Capillary 116 (H) 70 - 99 mg/dL      Comment: Glucose reference range applies only to samples taken after fasting for at least 8 hours.    Comment 1 Notify RN      Comment 2 Document in Chart    Glucose, capillary     Status: Abnormal    Collection Time: 05/09/24 11:25 AM  Result Value Ref Range    Glucose-Capillary 114 (H) 70 - 99 mg/dL      Comment: Glucose reference range applies only to samples taken after fasting for at least 8 hours.      Imaging Results (Last 48 hours)  No  results found.         Blood pressure 134/84, pulse 79, temperature 98.7 F (37.1 C), temperature source Oral, resp. rate 19, height 5' 4 (1.626 m), weight 92.1 kg, SpO2 97%.   Medical Problem List and Plan: 1. 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 Pt is s/p IR w TICI2b. CVA likely cardioembolic             -patient may  shower             -ELOS/Goals: 14-20 days, Min A PT/OT, Sup with SLP              -Admit to CIR 2.  Antithrombotics: -DVT/anticoagulation:  Pharmaceutical: Xarelto              -  antiplatelet therapy: N/A 3. Pain Management: Tylenol  prn for pain              --Has been on topamax  for HA in the past.  4. Mood/Behavior/Sleep: LCSW to follow for evaluaton and support.              --trazodone  prn for insomnia.              -antipsychotic agents: N/A 5. Neuropsych/cognition: This patient is capable of making decisions on her own behalf. 6. Skin/Wound Care: Routine pressure relief measures.  7. Fluids/Electrolytes/Nutrition: Monitor I/O. Add CM restrictions to diet             -Post stroke Dysphagia --SLP following             -Constipation: Scheduled miralax  and  prns  8. A fib: Monitor HR TID-- Metoprolol  succinate 200 mg daily  --Continue Xarelto .  9. HTN: Monitor BP TID. On metoprolol  and aldactone  10. NICM w/HFrEF: Discussed with Cardiology Dr. Maceo. Digoxin  and metoprolol  tartrate was stooped. Entresto  not continued. She is continued on spironolactone  and Metoprolol  succinate 200mg  daily in the AM.  11.  Pre-diabetes: On Farxiga  and should help w/BS. Add CM restrictions to diet.  --Monitor BS ac/hs and use SSI tor elevated BS.   12. HLD- LDL 109 crestor  40 mg daily  13. Obesity:  BMI 34.84 educate on diet and weight loss to promote overall health and mobility.  14. Leukocytosis: Trend WBC and monitor for s/s infection WBC 14.2--13.9--11.9      Brandi L Leak, NP 05/09/2024    I have personally performed a face to face diagnostic  evaluation of this patient and formulated the key components of the plan.  Additionally, I have personally reviewed laboratory data, imaging studies, as well as relevant notes and concur with the physician assistant's documentation above.   The patient's status has not changed from the original H&P.  Any changes in documentation from the acute care chart have been noted above.   Murray Collier, MD

## 2024-05-09 NOTE — Progress Notes (Signed)
 Physical Therapy Treatment Patient Details Name: [name] MRN: [id number] DOB: [birth year] Today's Date: 05/09/2024   History of Present Illness 58 yo female arrives to [facility] ED on 05/05/24 for L sided weakness and L hemianopia. CTA shows R MCA M1 occlusion. S/p mechnical thrombectomy. PMH: stroke (2019), afib.    PT Comments  Pt in bed at start of session and agreeable to therapy. Pt to work on functional mobility and transfers during today's session. Pt requiring +2 modA for bed mobility given deficits on L side. Pt continues to demonstrate heavy L leaning while seated and standing, but will briefly correct with cues. Pt requiring +2 maxA for transfers due to lethargy and fatigue. Pt to benefit from intensive inpatient follow-up therapy, >3 hours/day to address L hemiparesis and inattention. PT to follow acutely.     If plan is discharge home, recommend the following: A lot of help with walking and/or transfers;A lot of help with bathing/dressing/bathroom   Can travel by private vehicle        Equipment Recommendations  None recommended by PT;Other (comment) (TBD)    Recommendations for Other Services       Precautions / Restrictions Precautions Precautions: Fall Recall of Precautions/Restrictions: Impaired Restrictions Weight Bearing Restrictions Per Provider Order: No     Mobility  Bed Mobility Overal bed mobility: Needs Assistance Bed Mobility: Rolling, Sidelying to Sit, Sit to Supine Rolling: +2 for physical assistance, Mod assist Sidelying to sit: Mod assist, +2 for physical assistance, HOB elevated   Sit to supine: Mod assist, +2 for physical assistance   General bed mobility comments: Max sequencing cues. Assist for trunk elevation and LE management.    Transfers   Equipment used: 2 person hand held assist Transfers: Sit to/from Stand, Bed to chair/wheelchair/BSC Sit to Stand: Max assist, +2 physical assistance Stand pivot transfers: +2 physical assistance, Max  assist   Squat pivot transfers: Max assist, +2 physical assistance     General transfer comment: Stand x3. Assist for power up, rise, and steady. Stand pivot towards R requiring max L knee blocking to prevent buckling. R + L knee blocking while squat pivoting to L to prevent both knees from buckling    Ambulation/Gait                   Stairs             Wheelchair Mobility     Tilt Bed    Modified Rankin (Stroke Patients Only) Modified Rankin (Stroke Patients Only) Pre-Morbid Rankin Score: No symptoms Modified Rankin: Severe disability     Balance Overall balance assessment: Needs assistance Sitting-balance support: Feet supported, Single extremity supported Sitting balance-Leahy Scale: Poor Sitting balance - Comments: Heavy L lateral bias, able to correct with tactile and verbal cues Postural control: Left lateral lean Standing balance support: Bilateral upper extremity supported, During functional activity Standing balance-Leahy Scale: Poor Standing balance comment: reliant on +2 max assist                            Communication Communication Communication: No apparent difficulties  Cognition Arousal: Lethargic Behavior During Therapy: Flat affect   PT - Cognitive impairments: Awareness, Problem solving, Safety/Judgement, Sequencing                       PT - Cognition Comments: Significant L sided weakness and inattention. max sequencing cues. Aware that she is not at baseline  Following commands: Impaired Following commands impaired: Follows one step commands with increased time    Cueing Cueing Techniques: Verbal cues, Gestural cues  Exercises      General Comments General comments (skin integrity, edema, etc.): VSS      Pertinent Vitals/Pain Pain Assessment Pain Assessment: Faces Faces Pain Scale: No hurt    Home Living                          Prior Function            PT Goals (current goals  can now be found in the care plan section) Acute Rehab PT Goals PT Goal Formulation: With patient Time For Goal Achievement: 05/20/24 Potential to Achieve Goals: Good Progress towards PT goals: Progressing toward goals    Frequency    Min 3X/week      PT Plan      Co-evaluation              AM-PAC PT 6 Clicks Mobility   Outcome Measure  Help needed turning from your back to your side while in a flat bed without using bedrails?: A Lot Help needed moving from lying on your back to sitting on the side of a flat bed without using bedrails?: A Lot Help needed moving to and from a bed to a chair (including a wheelchair)?: Total Help needed standing up from a chair using your arms (e.g., wheelchair or bedside chair)?: Total Help needed to walk in hospital room?: Total Help needed climbing 3-5 steps with a railing? : Total 6 Click Score: 8    End of Session Equipment Utilized During Treatment: Gait belt Activity Tolerance: Patient tolerated treatment well;Patient limited by fatigue Patient left: in bed;with call bell/phone within reach;with bed alarm set;with family/visitor present Nurse Communication: Mobility status PT Visit Diagnosis: Other abnormalities of gait and mobility (R26.89);Muscle weakness (generalized) (M62.81)     Time: 8563-8493 PT Time Calculation (min) (ACUTE ONLY): 30 min  Charges:    $Therapeutic Activity: 8-22 mins $Neuromuscular Re-education: 8-22 mins PT General Charges $$ ACUTE PT VISIT: 1 Visit                     Quintin Campi, SPT  Acute Rehab  [phone]    Quintin Campi 05/09/2024, 4:30 PM

## 2024-05-09 NOTE — Progress Notes (Signed)
 Inpatient Rehab Admissions Coordinator:    I have a CIR bed for this Pt. Today. Will plan for admission.  Leita Kleine, MS, CCC-SLP Rehab Admissions Coordinator  [phone] (celll) [phone] (office)

## 2024-05-09 NOTE — Progress Notes (Signed)
 PMR Admission Coordinator Pre-Admission Assessment   Patient: [name] is an 58 y.o., female MRN: [id number] DOB: 1966-07-25 Height: 5' 4 (162.6 cm) Weight: 92.1 kg   Insurance Information HMO:     PPO:      PCP:      IPA:      80/20:      OTHER:  PRIMARY: [place] [employer]      Policy#: [id number]      Subscriber: pt CM Name: Cacilie Glasgaw-LeBatard      Phone#: [phone]     Fax#: [phone] or email [email or web address] Pre-Cert#: [id number] approved for 7 days 8//25 until 8/31 with initial review due on day 6. Please call and confirm date of admit    Employer:  Benefits:  Phone #: [phone]     Name: 8/22 Eff. Date: 03/15/24 until 12/13/24     Deduct: none      Out of Pocket Max: none      Life Max: none CIR: 100% per medicaid      SNF: 100% first 90 days covered by [facility] Medicaid then members move to [facility] Outpatient: 100%      Co-Pay: 27 visits per year combined Home Health: 100%      Co-Pay: HHA services must be limited to 100 total visits per year DME: 100%     Co-Pay: per medical neccesity Providers: in network  SECONDARY: none      Policy#:      Phone#:    Artist:       Phone#:    The Data processing manager" for patients in Inpatient Rehabilitation Facilities with attached "Privacy Act Statement-Health Care Records" was provided and verbally reviewed with: Patient and Family   Emergency Contact Information Contact Information       Name Relation Home Work Mobile    Coleman Daughter     [phone]    Gibas,[name] Daughter     [phone]    Eisenhart,Latoya Daughter     [phone]         Other Contacts   None on File      Current Medical History  Patient Admitting Diagnosis: CVA   History of Present Illness: 58 year old female with history of R posterior MCA embolic CVA 2019, HTN, Type 2 DM, nonischemic cardiomyopathy and combined HFrEF, r tempo parietal lobe with upper M2 cutoff 2017, afib,  on Xarelto  who presented on 05/05/24 with acute onset of left sided weakness while at work. Last taken Xarelto  on 2/19.    CTH negative. CTA showed abrupt occlusion of right M! Segment . S/p mechanical thrombectomy on 8/21 with TICI2b revascularization.  Etiology felt embolic in the setting of afib on Xarelto . MRI acute right MCA territory infarcts with petechial hemorrhage. 2 d echo EF 60 to 65 %. VTE prophylaxis with lovenox . Xarelto  daily pta, now on ASA for 2-3 days and then Xarelto .     Home meds GDMT, Farxiga , digoxin , Lasix , Entresto , Aldactone  and reported noncompliance with meds. Crestor  pta with LDL 109. To resume. Hgb A1c 6.1. CBGS and SSI. Recommend close f/u with PCP for better DM control.    FEES complete on 8/22. Recommended D 2 diet with thin liquids, whole meds with puree.    Complete NIHSS TOTAL: 13   Patient's medical record from [facility] has been reviewed by the rehabilitation admission coordinator and physician.   Past Medical History      Past Medical History:  Diagnosis Date   Clotting  disorder (HCC)      on xarelto    Essential hypertension     Heart murmur      a. 10/2015 Echo: EF 60-65%, no rwma, mild AI/MR, sev dil LA/RA, PASP .   Noncompliance     NSVT (nonsustained ventricular tachycardia) (HCC)      a. 10/2015 during admission for CVA/AF.   Persistent atrial fibrillation (HCC)      a. CHA2DS2VASC = 4-->Xarelto ;  b. 02/2016 Successful DCCV.  c. Recurrent fib   Pneumonia 2012 x 2; 2015   Stroke [facility])      a. 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   Transient ischemic attack (TIA) 01/2013        Has the patient had major surgery during 100 days prior to admission? Yes   Family History   family history includes Atrial fibrillation in her father and mother; Breast cancer in her mother; Cancer in her mother; Diabetes in an other family member; Hypertension in  her father and another family member; Peripheral vascular disease in her father.   Current Medications  Current Medications    Current Facility-Administered Medications:    acetaminophen  (TYLENOL ) tablet 650 mg, 650 mg, Oral, Q4H PRN **OR** acetaminophen  (TYLENOL ) 160 MG/5ML solution 650 mg, 650 mg, Per Tube, Q4H PRN, 650 mg at 05/05/24 1819 **OR** acetaminophen  (TYLENOL ) suppository 650 mg, 650 mg, Rectal, Q4H PRN, Lehner, Erin C, NP   aspirin  chewable tablet 324 mg, 324 mg, Oral, Daily, Shafer, Devon, NP, 324 mg at 05/06/24 1333   Chlorhexidine  Gluconate Cloth 2 % PADS 6 each, 6 each, Topical, Daily, Janjua, Rashid M, MD, 6 each at 05/06/24 1512   digoxin  (LANOXIN ) tablet 0.25 mg, 0.25 mg, Oral, Daily, Merilee Linsey I, RPH, 0.25 mg at 05/06/24 1102   enoxaparin  (LOVENOX ) injection 40 mg, 40 mg, Subcutaneous, Q24H, Shafer, Devon, NP, 40 mg at 05/06/24 1228   hydrALAZINE  (APRESOLINE ) injection 10 mg, 10 mg, Intravenous, Q4H PRN, Jerri Pfeiffer, MD   insulin  aspart (novoLOG ) injection 0-15 Units, 0-15 Units, Subcutaneous, Q4H, Michaela Aisha SQUIBB, MD, 2 Units at 05/06/24 1228   labetalol  (NORMODYNE ) injection 20 mg, 20 mg, Intravenous, Q4H PRN, Jerri Pfeiffer, MD   metoprolol  tartrate (LOPRESSOR ) tablet 50 mg, 50 mg, Oral, BID, Shafer, Devon, NP, 50 mg at 05/06/24 1102   Oral care mouth rinse, 15 mL, Mouth Rinse, 4 times per day, Janjua, Rashid M, MD, 15 mL at 05/06/24 1228   Oral care mouth rinse, 15 mL, Mouth Rinse, PRN, Janjua, Rashid M, MD   rosuvastatin  (CRESTOR ) tablet 20 mg, 20 mg, Oral, q1800, Michaela Aisha SQUIBB, MD, 20 mg at 05/05/24 2250   senna-docusate (Senokot-S) tablet 1 tablet, 1 tablet, Oral, QHS PRN, Lehner, Erin C, NP   spironolactone  (ALDACTONE ) tablet 25 mg, 25 mg, Oral, Daily, Shafer, Devon, NP, 25 mg at 05/06/24 1102     Patients Current Diet:  Diet Order                  DIET DYS 2 Room service appropriate? Yes with Assist; Fluid consistency: Thin  Diet  effective now                       Precautions / Restrictions Precautions Precautions: Fall Restrictions Weight Bearing Restrictions Per Provider Order: No    Has the patient had 2 or more falls or a fall with injury in the past year? No  Prior Activity Level Community (5-7x/wk): independent, works in [employer], drives   Prior Functional Level Self Care: Did the patient need help bathing, dressing, using the toilet or eating? Independent   Indoor Mobility: Did the patient need assistance with walking from room to room (with or without device)? Independent   Stairs: Did the patient need assistance with internal or external stairs (with or without device)? Independent   Functional Cognition: Did the patient need help planning regular tasks such as shopping or remembering to take medications? Independent   Patient Information Are you of Hispanic, Latino/a,or Spanish origin?: A. No, not of Hispanic, Latino/a, or Spanish origin What is your race?: B. Black or African American Do you need or want an interpreter to communicate with a doctor or health care staff?: 0. No   Patient's Response To:  Health Literacy and Transportation Is the patient able to respond to health literacy and transportation needs?: Yes Health Literacy - How often do you need to have someone help you when you read instructions, pamphlets, or other written material from your doctor or pharmacy?: Never In the past 12 months, has lack of transportation kept you from medical appointments or from getting medications?: No In the past 12 months, has lack of transportation kept you from meetings, work, or from getting things needed for daily living?: No   Home Assistive Devices / Equipment Home Equipment: None   Prior Device Use: Indicate devices/aids used by the patient prior to current illness, exacerbation or injury? None of the above   Current Functional Level Cognition   Arousal/Alertness:  Awake/alert Overall Cognitive Status: Impaired/Different from baseline Orientation Level: Oriented X4 Attention: Focused, Sustained Focused Attention: Appears intact Sustained Attention: Impaired Sustained Attention Impairment: Verbal basic, Functional basic Memory: Appears intact Awareness: Impaired Awareness Impairment: Emergent impairment, Anticipatory impairment Problem Solving: Impaired Problem Solving Impairment: Functional basic Executive Function: Initiating Behaviors: Other (comment) (flat affect) Safety/Judgment: Impaired    Extremity Assessment (includes Sensation/Coordination)   Upper Extremity Assessment: Right hand dominant, LUE deficits/detail LUE Deficits / Details: Flaccid to hand, wrist and forearm.  Trace bicep, no tricep activation noted.  Increased tone noted throughout LUE.  Swelling to L hand LUE Sensation: WNL LUE Coordination: decreased fine motor, decreased gross motor  Lower Extremity Assessment: Defer to PT evaluation LLE Deficits / Details: 1/5 knee flexion/extension, 0/5 hip abd/add, 2/5 hip flex/ext functionally. Hypertonicity     ADLs   Overall ADL's : Needs assistance/impaired Eating/Feeding: Moderate assistance, Sitting Grooming: Minimal assistance, Sitting Upper Body Bathing: Moderate assistance, Sitting Lower Body Bathing: Maximal assistance, Bed level Upper Body Dressing : Maximal assistance, Standing Lower Body Dressing: Maximal assistance, Bed level Toilet Transfer: Maximal assistance, Squat-pivot, BSC/3in1 Toileting- Clothing Manipulation and Hygiene: Total assistance, Sit to/from stand     Mobility   Overal bed mobility: Needs Assistance Bed Mobility: Supine to Sit Supine to sit: +2 for physical assistance, Mod assist General bed mobility comments: up in the recliner     Transfers   Overall transfer level: Needs assistance Equipment used: 2 person hand held assist Transfers: Sit to/from Stand, Bed to chair/wheelchair/BSC Sit to  Stand: Mod assist, +2 physical assistance Bed to/from chair/wheelchair/BSC transfer type:: Stand pivot Stand pivot transfers: Mod assist, +2 physical assistance General transfer comment: assist for power up, rise, steady. Stand x2 from EOB, stand pivot towards R requiring max L knee blocking during RLE stepping.     Ambulation / Gait / Stairs / Wheelchair Mobility   Ambulation/Gait General Gait Details: nt  Posture / Balance Dynamic Sitting Balance Sitting balance - Comments: Heavy L lateral bias, tactile and verbal cuing to correct Balance Overall balance assessment: Needs assistance Sitting-balance support: Feet supported, Single extremity supported Sitting balance-Leahy Scale: Poor Sitting balance - Comments: Heavy L lateral bias, tactile and verbal cuing to correct Postural control: Left lateral lean Standing balance support: Bilateral upper extremity supported, During functional activity Standing balance-Leahy Scale: Poor Standing balance comment: reliant on +2     Special considerations/life events  Reports noncompliance with meds Hgb A1c 6.1    Previous Home Environment  Living Arrangements:  (lives with daughter)  Lives With: Daughter Available Help at Discharge: Family, Available 24 hours/day (Has 7 children who can make schedules to provide care) Type of Home: House Home Layout: One level Home Access: Stairs to enter Entrance Stairs-Rails: None Entrance Stairs-Number of Steps: 3 Bathroom Shower/Tub: Associate Professor: Yes How Accessible: Accessible via walker Home Care Services: No   Discharge Living Setting Plans for Discharge Living Setting: Patient's home, Lives with (comment) (daughter) Type of Home at Discharge: House Discharge Home Layout: One level Discharge Home Access: Stairs to enter Entrance Stairs-Rails: None Entrance Stairs-Number of Steps: 3 Discharge Bathroom Shower/Tub: Tub/shower unit Discharge  Bathroom Toilet: Standard Discharge Bathroom Accessibility: Yes How Accessible: Accessible via walker Does the patient have any problems obtaining your medications?: No   Social/Family/Support Systems Patient Roles: Parent (employee) Contact Information: Hospital doctor, daughter that lives together Anticipated Caregiver: children, Hospital doctor, is a Careers information officer and can take time off Anticipated Industrial/product designer Information: see contacts Ability/Limitations of Caregiver: family can make schedules to provide care Caregiver Availability: 24/7 Discharge Plan Discussed with Primary Caregiver: Yes Is Caregiver In Agreement with Plan?: Yes Does Caregiver/Family have Issues with Lodging/Transportation while Pt is in Rehab?: No   Goals Patient/Family Goal for Rehab: min assist with PT, OT and  supervision with SLP Expected length of stay: ELOS 14 to 20 days Additional Information: I have discussed need for ramp at home on 8/22. Patient discussed needing rails at home at entry Pt/Family Agrees to Admission and willing to participate: Yes Program Orientation Provided & Reviewed with Pt/Caregiver Including Roles  & Responsibilities: Yes   Decrease burden of Care through IP rehab admission: n/a   Possible need for SNF placement upon discharge: not anticipated   Patient Condition: I have reviewed medical records from [facility], spoken with  patient and daughter. I met with patient at the bedside for inpatient rehabilitation assessment.  Patient will benefit from ongoing PT, OT, and SLP, can actively participate in 3 hours of therapy a day 5 days of the week, and can make measurable gains during the admission.  Patient will also benefit from the coordinated team approach during an Inpatient Acute Rehabilitation admission.  The patient will receive intensive therapy as well as Rehabilitation physician, nursing, social worker, and care management interventions.  Due to bladder management, bowel  management, safety, skin/wound care, disease management, medication administration, pain management, and patient education the patient requires 24 hour a day rehabilitation nursing.  The patient is currently mod A with mobility and basic ADLs.  Discharge setting and therapy post discharge at home with home health is anticipated.  Patient has agreed to participate in the Acute Inpatient Rehabilitation Program and will admit today.   Preadmission Screen Completed By:  Alison Heron Lot, RN MSN 05/06/2024 5:11 PM ______________________________________________________________________   Discussed status with Dr. Urbano  on 05/09/24 at 900 and received approval for  admission today.   Admission Coordinator:  Alison Heron Lot, RN MSN time 1026/Date 05/09/24  with updates by Leita Kleine, MS, CCC-SLP     Assessment/Plan: Diagnosis: CVA Does the need for close, 24 hr/day Medical supervision in concert with the patient's rehab needs make it unreasonable for this patient to be served in a less intensive setting? Yes Co-Morbidities requiring supervision/potential complications: A fib, Stoke HTN, cardiomyopathy, HFrEF, HLD, dysphagia, Obesity, leukocytosis  Due to bladder management, bowel management, safety, skin/wound care, disease management, medication administration, pain management, and patient education, does the patient require 24 hr/day rehab nursing? Yes Does the patient require coordinated care of a physician, rehab nurse, PT, OT, and SLP to address physical and functional deficits in the context of the above medical diagnosis(es)? Yes Addressing deficits in the following areas: balance, endurance, locomotion, strength, transferring, bowel/bladder control, bathing, dressing, feeding, grooming, toileting, cognition, speech, language, swallowing, and psychosocial support Can the patient actively participate in an intensive therapy program of at least 3 hrs of therapy 5 days a week? Yes The  potential for patient to make measurable gains while on inpatient rehab is excellent Anticipated functional outcomes upon discharge from inpatient rehab: min assist PT, min assist OT, supervision SLP Estimated rehab length of stay to reach the above functional goals is: 14-20 Anticipated discharge destination: Home 10. Overall Rehab/Functional Prognosis: good     MD Signature: Murray Collier

## 2024-05-09 NOTE — Progress Notes (Signed)
 Awaiting transfer to rehab. Full note to follow, but these are recommended cardiac meds at this time:  LVEF has returned to normal (confirming impression that she had tachycardia cardiomyopathy). She is well rate-controlled.  Normal electrolytes and renal function.   Poor compliance with follow up and meds has been an issue (at least partly due to financial issues), so will try to simplify her list of medications.  Stop digoxin  and metoprolol  tartrate. Will not resume Entresto .  Recommend: Metoprolol  succinate 200 mg daily starting in AM. Spironolactone  25 mg daily Xarelto  20 mg daily  Will monitor over the next several days:  - If she develops RVR, may have to add back digoxin .  - If BP is >140/90, will add ARB (losartan ).  - If she develops evidence of HF/hypervolemia, will add loop diuretic  Jerel Balding, MD, [facility] [phone] office [phone] pager

## 2024-05-09 NOTE — Consult Note (Signed)
 Cardiology Consultation   Patient ID: [name] MRN: [id number]; DOB: December 28, 1965  Admit date: 05/09/2024 Date of Consult: 05/09/2024  PCP:  Delbert Clam, MD   [facility] Providers Cardiologist:  Jerel Balding, MD   Patient Profile: [name] is a 58 y.o. female with a hx of nonobstructive CAD on cardiac catheterization 03/2021, NICM with recovered EF, permanent atrial fibrillation/flutter, hypertension, hyperlipidemia, type 2 diabetes, prior CVA, severe OSA, obesity who is being seen 05/09/2024 for the evaluation of atrial fibrillation/CHF at the request of neurology.  History of Present Illness: [name] has history of permanent atrial fibrillation.  She has NICM felt to be tachycardia mediated with EF 25 to 30% 03/2021.  Underwent right left heart catheterization showing 60% stenosis of mid LAD, 75% stenosis of distal LCx and 70% stenosis in proximal D1 with mild pulmonary hypertension.  Medical therapy recommended.  EF had recovered 02/2023.  She was last seen in follow-up 10/2022 without any complaints.  Left ventricular function remains normal on assessment during this admission.  Currently patient being evaluated for acute right MCA and ACA scattered infarcts status post IR with TICI2b and petechial hemorrhages.  Felt to be cardioembolic in the setting of noncompliance with Xarelto .  Rates have been controlled.  Echocardiogram this admission showing preserved EF.  Cardiology asked to see for permanent A-fib and GDMT.  She has dense left-sided hemiplegia and left-sided neglect.  Moving the left foot a little bit, but seems to have no function in the left upper extremity.  She describes a mild headache.  She does not appear to have difficulty speaking.   Past Medical History:  Diagnosis Date   Clotting disorder (HCC)    on xarelto    Essential hypertension    Heart murmur    a. 10/2015 Echo: EF 60-65%, no rwma, mild AI/MR, sev dil LA/RA, PASP .   Noncompliance     NSVT (nonsustained ventricular tachycardia) (HCC)    a. 10/2015 during admission for CVA/AF.   Persistent atrial fibrillation (HCC)    a. CHA2DS2VASC = 4-->Xarelto ;  b. 02/2016 Successful DCCV.  c. Recurrent fib   Pneumonia 2012 x 2; 2015   Stroke [facility])    a. 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   Transient ischemic attack (TIA) 01/2013    Past Surgical History:  Procedure Laterality Date   CARDIOVERSION N/A 02/18/2013   Procedure: CARDIOVERSION;  Surgeon: Jerel Balding, MD;  Location: [facility] ENDOSCOPY;  Service: Cardiovascular;  Laterality: N/A;   CARDIOVERSION N/A 02/23/2013   Procedure: CARDIOVERSION;  Surgeon: Alm LELON Clay, MD;  Location: [facility] OR;  Service: Cardiovascular;  Laterality: N/A;  BESIDE CV   CARDIOVERSION N/A 03/12/2016   Procedure: CARDIOVERSION;  Surgeon: Ezra GORMAN Shuck, MD;  Location: [facility] ENDOSCOPY;  Service: Cardiovascular;  Laterality: N/A;   RADIOLOGY WITH ANESTHESIA N/A 05/05/2024   Procedure: RADIOLOGY WITH ANESTHESIA;  Surgeon: Radiologist, Medication, MD;  Location: [facility] OR;  Service: Radiology;  Laterality: N/A;   RIGHT/LEFT HEART CATH AND CORONARY ANGIOGRAPHY N/A 04/02/2021   Procedure: RIGHT/LEFT HEART CATH AND CORONARY ANGIOGRAPHY;  Surgeon: Claudene Victory LELON, MD;  Location: [facility] INVASIVE CV LAB;  Service: Cardiovascular;  Laterality: N/A;   TEE WITHOUT CARDIOVERSION N/A 02/18/2013   Procedure: TRANSESOPHAGEAL ECHOCARDIOGRAM (TEE);  Surgeon: Jerel Balding, MD;  Location: [facility] ENDOSCOPY;  Service: Cardiovascular;  Laterality: N/A;   TUBAL LIGATION  1992    Scheduled Meds:  insulin  aspart  0-5 Units  Subcutaneous QHS   insulin  aspart  0-9 Units Subcutaneous TID WC   mouth rinse  15 mL Mouth Rinse 4 times per day   rivaroxaban   20 mg Oral Q supper   rosuvastatin   20 mg Oral q1800   [START ON 05/10/2024] spironolactone   25 mg Oral Daily    Continuous Infusions:  PRN  Meds: acetaminophen , alum & mag hydroxide-simeth, bisacodyl , diphenhydrAMINE , guaiFENesin -dextromethorphan, prochlorperazine  **OR** prochlorperazine  **OR** prochlorperazine , senna-docusate, sodium phosphate , traZODone   Allergies:    Allergies  Allergen Reactions   Penicillins Hives and Other (See Comments)    Unknown  Has patient had a PCN reaction causing immediate rash, facial/tongue/throat swelling, SOB or lightheadedness with hypotension: No Has patient had a PCN reaction causing severe rash involving mucus membranes or skin necrosis: No Has patient had a PCN reaction that required hospitalization No Has patient had a PCN reaction occurring within the last 10 years: No If all of the above answers are NO, then may proceed with Cephalosporin use.    Social History:   Social History   Socioeconomic History   Marital status: Single    Spouse name: Not on file   Number of children: Not on file   Years of education: Not on file   Highest education level: Associate degree: occupational, Scientist, product/process development, or vocational program  Occupational History   Occupation: Multimedia programmer: [employer]   Occupation: in home health aide  Tobacco Use   Smoking status: Former    Current packs/day: 0.00    Types: Cigarettes    Start date: 03/15/2010    Quit date: 03/15/2013    Years since quitting: 11.1   Smokeless tobacco: Never  Vaping Use   Vaping status: Never Used  Substance and Sexual Activity   Alcohol use: Yes    Comment: drinks on the weekends,   2-3 beers   Drug use: No   Sexual activity: Not Currently    Birth control/protection: None  Other Topics Concern   Not on file  Social History Narrative   Not on file   Social Drivers of Health   Financial Resource Strain: Low Risk  (11/18/2023)   Overall Financial Resource Strain (CARDIA)    Difficulty of Paying Living Expenses: Not hard at all  Food Insecurity: No Food Insecurity (11/18/2023)   Hunger Vital Sign    Worried  About Running Out of Food in the Last Year: Never true    Ran Out of Food in the Last Year: Never true  Transportation Needs: Unmet Transportation Needs (11/18/2023)   PRAPARE - Transportation    Lack of Transportation (Medical): Yes    Lack of Transportation (Non-Medical): Yes  Physical Activity: Unknown (11/18/2023)   Exercise Vital Sign    Days of Exercise per Week: 5 days    Minutes of Exercise per Session: Patient declined  Stress: Stress Concern Present (11/18/2023)   [employer] of Occupational Health - Occupational Stress Questionnaire    Feeling of Stress : Rather much  Social Connections: Unknown (11/18/2023)   Social Connection and Isolation Panel    Frequency of Communication with Friends and Family: Patient declined    Frequency of Social Gatherings with Friends and Family: Patient declined    Attends Religious Services: Patient declined    Active Member of Clubs or Organizations: No    Attends Banker Meetings: Not on file    Marital Status: Never married  Intimate Partner Violence: Unknown (12/17/2021)   Received from [facility]  HITS    Physically Hurt: Not on file    Insult or Talk Down To: Not on file    Threaten Physical Harm: Not on file    Scream or Curse: Not on file    Family History:   Family History  Problem Relation Age of Onset   Cancer Mother    Atrial fibrillation Mother    Breast cancer Mother    Hypertension Father    Atrial fibrillation Father    Peripheral vascular disease Father    Hypertension Other    Diabetes Other    Heart attack Neg Hx    Stroke Neg Hx    Rectal cancer Neg Hx    Colon cancer Neg Hx    Esophageal cancer Neg Hx    Stomach cancer Neg Hx      ROS:  Please see the history of present illness. All other ROS reviewed and negative.     Physical Exam/Data: Vitals:   05/09/24 1701  BP: 120/74  Pulse: 91  Resp: 16  Temp: 98 F (36.7 C)  TempSrc: Oral  SpO2: 100%  Weight: 88.6 kg  Height: 5' 3.5  (1.613 m)    No intake or output data in the 24 hours ending 05/09/24 1729     05/09/2024    5:01 PM 05/05/2024   11:11 AM 05/05/2024   11:00 AM  Last 3 Weights  Weight (lbs) 195 lb 5.2 oz 203 lb 203 lb 11.3 oz  Weight (kg) 88.6 kg 92.08 kg 92.4 kg     Body mass index is 34.06 kg/m.  General:  Well nourished, well developed, in no acute distress HEENT: normal Neck: no JVD Vascular: No carotid bruits; Distal pulses 2+ bilaterally Cardiac:  normal S1, S2; irregular rhythm; no murmurs or gallops Lungs:  clear to auscultation bilaterally, no wheezing, rhonchi or rales  Abd: soft, nontender, no hepatomegaly  Ext: no edema Musculoskeletal:  No deformities Skin: warm and dry  Neuro: Severe left hemiplegia and left-sided neglect Psych:  Normal affect   EKG:  The EKG was personally reviewed and demonstrates:  no EKG this admission. Telemetry:  Telemetry was personally reviewed and demonstrates: Atrial fibrillation with controlled ventricular response  Relevant CV Studies: Echocardiogram 05/05/2024  1. Left ventricular ejection fraction, by estimation, is 60 to 65%. The  left ventricle has normal function. The left ventricle has no regional  wall motion abnormalities. There is mild concentric left ventricular  hypertrophy. Left ventricular diastolic  function could not be evaluated.   2. Right ventricular systolic function is normal. The right ventricular  size is normal. There is mildly elevated pulmonary artery systolic  pressure. The estimated right ventricular systolic pressure is 41.0 mmHg.   3. Left atrial size was moderately dilated.   4. Right atrial size was moderately dilated.   5. The mitral valve is normal in structure. Trivial mitral valve  regurgitation. No evidence of mitral stenosis.   6. Tricuspid valve regurgitation is mild to moderate.   7. The aortic valve is tricuspid. There is mild calcification of the  aortic valve. Aortic valve regurgitation is mild. No  aortic stenosis is  present.   8. The inferior vena cava is normal in size with greater than 50%  respiratory variability, suggesting right atrial pressure of 3 mmHg.    Laboratory Data: High Sensitivity Troponin:  No results for input(s): TROPONINIHS in the last 720 hours.   Chemistry Recent Labs  Lab 05/06/24 0559 05/07/24 0607 05/08/24 0344  NA  141 139 141  K 4.0 3.8 4.2  CL 111 110 111  CO2 23 24 23   GLUCOSE 135* 105* 98  BUN 10 12 13   CREATININE 0.87 0.90 0.96  CALCIUM  9.0 8.8* 8.7*  GFRNONAA >60 >60 >60  ANIONGAP 7 5 7     Recent Labs  Lab 05/05/24 1115 05/06/24 0559  PROT 7.6 7.1  ALBUMIN  3.6 3.3*  AST 33 22  ALT 26 20  ALKPHOS 77 65  BILITOT 0.5 0.7   Lipids  Recent Labs  Lab 05/06/24 0559  CHOL 152  TRIG 39  HDL 35*  LDLCALC 109*  CHOLHDL 4.3    Hematology Recent Labs  Lab 05/06/24 0559 05/07/24 0607 05/08/24 0344  WBC 14.2* 13.9* 11.9*  RBC 4.32 4.05 4.15  HGB 11.6* 11.0* 11.4*  HCT 36.0 34.2* 35.4*  MCV 83.3 84.4 85.3  MCH 26.9 27.2 27.5  MCHC 32.2 32.2 32.2  RDW 13.9 14.2 14.6  PLT 159 159 153   Thyroid No results for input(s): TSH, FREET4 in the last 168 hours.  BNPNo results for input(s): BNP, PROBNP in the last 168 hours.  DDimer No results for input(s): DDIMER in the last 168 hours.  Radiology/Studies:  MR BRAIN WO CONTRAST Result Date: 05/06/2024 CLINICAL DATA:  Stroke, follow up EXAM: MRI HEAD WITHOUT CONTRAST TECHNIQUE: Multiplanar, multiecho pulse sequences of the brain and surrounding structures were obtained without intravenous contrast. COMPARISON:  Same day CT head. FINDINGS: Brain: Multifocal acute right MCA territory infarcts including infarcts in the right basal ganglia, right frontal and right parietal lobes. Associated edema without mass effect. No midline shift. Areas of petechial hemorrhage in the right parietal lobe. Small additional acute infarct in the splenium of the corpus callosum. No mass occupying  acute hemorrhage. No hydrocephalus. No mass lesion. Remote left cerebellar infarcts. Patchy T2/FLAIR hyperintensities in the white matter, compatible with chronic microvascular ischemic disease. Vascular: Better evaluated on recent catheter arteriogram and CTA. Skull and upper cervical spine: Normal marrow signal. Sinuses/Orbits: Clear sinuses.  No acute orbital findings. IMPRESSION: Acute right MCA territory infarcts with petechial hemorrhage, as described above. No mass effect. Electronically Signed   By: Gilmore GORMAN Molt M.D.   On: 05/06/2024 00:55   ECHOCARDIOGRAM COMPLETE Result Date: 05/05/2024    ECHOCARDIOGRAM REPORT   Patient Name:   [name] Date of Exam: 05/05/2024 Medical Rec #:  [id number]     Height:       64.0 in Accession #:    [id number]    Weight:       203.0 lb Date of Birth:  [birth year]      BSA:          1.969 m Patient Age:    57 years      BP:           126/76 mmHg Patient Gender: F             HR:           95 bpm. Exam Location:  Inpatient Procedure: 2D Echo, Cardiac Doppler and Color Doppler (Both Spectral and Color            Flow Doppler were utilized during procedure). Indications:    Stroke I63.9  History:        Patient has prior history of Echocardiogram examinations, most                 recent 02/27/2023. CHF, Angina, Stroke and TIA, Arrythmias:Atrial  Fibrillation and Tachycardia; Risk Factors:Hypertension, Sleep                 Apnea, Diabetes, Current Smoker and Dyslipidemia.  Sonographer:    Thea Norlander RCS Referring Phys: ERIN C LEHNER IMPRESSIONS  1. Left ventricular ejection fraction, by estimation, is 60 to 65%. The left ventricle has normal function. The left ventricle has no regional wall motion abnormalities. There is mild concentric left ventricular hypertrophy. Left ventricular diastolic function could not be evaluated.  2. Right ventricular systolic function is normal. The right ventricular size is normal. There is mildly elevated pulmonary  artery systolic pressure. The estimated right ventricular systolic pressure is 41.0 mmHg.  3. Left atrial size was moderately dilated.  4. Right atrial size was moderately dilated.  5. The mitral valve is normal in structure. Trivial mitral valve regurgitation. No evidence of mitral stenosis.  6. Tricuspid valve regurgitation is mild to moderate.  7. The aortic valve is tricuspid. There is mild calcification of the aortic valve. Aortic valve regurgitation is mild. No aortic stenosis is present.  8. The inferior vena cava is normal in size with greater than 50% respiratory variability, suggesting right atrial pressure of 3 mmHg. FINDINGS  Left Ventricle: Left ventricular ejection fraction, by estimation, is 60 to 65%. The left ventricle has normal function. The left ventricle has no regional wall motion abnormalities. The left ventricular internal cavity size was normal in size. There is  mild concentric left ventricular hypertrophy. Left ventricular diastolic function could not be evaluated due to atrial fibrillation. Left ventricular diastolic function could not be evaluated. Right Ventricle: The right ventricular size is normal. No increase in right ventricular wall thickness. Right ventricular systolic function is normal. There is mildly elevated pulmonary artery systolic pressure. The tricuspid regurgitant velocity is 3.00  m/s, and with an assumed right atrial pressure of 5 mmHg, the estimated right ventricular systolic pressure is 41.0 mmHg. Left Atrium: Left atrial size was moderately dilated. Right Atrium: Right atrial size was moderately dilated. Pericardium: There is no evidence of pericardial effusion. Mitral Valve: The mitral valve is normal in structure. Mild mitral annular calcification. Trivial mitral valve regurgitation. No evidence of mitral valve stenosis. Tricuspid Valve: The tricuspid valve is normal in structure. Tricuspid valve regurgitation is mild to moderate. No evidence of tricuspid  stenosis. Aortic Valve: The aortic valve is tricuspid. There is mild calcification of the aortic valve. Aortic valve regurgitation is mild. No aortic stenosis is present. Aortic valve peak gradient measures 6.2 mmHg. Pulmonic Valve: The pulmonic valve was normal in structure. Pulmonic valve regurgitation is not visualized. No evidence of pulmonic stenosis. Aorta: The aortic root is normal in size and structure. Venous: The inferior vena cava is normal in size with greater than 50% respiratory variability, suggesting right atrial pressure of 3 mmHg. IAS/Shunts: No atrial level shunt detected by color flow Doppler.  LEFT VENTRICLE PLAX 2D LVIDd:         3.70 cm   Diastology LVIDs:         2.50 cm   LV e' medial:    6.16 cm/s LV PW:         1.20 cm   LV E/e' medial:  16.4 LV IVS:        1.30 cm   LV e' lateral:   12.40 cm/s LVOT diam:     2.00 cm   LV E/e' lateral: 8.1 LV SV:         44 LV SV Index:  22 LVOT Area:     3.14 cm  RIGHT VENTRICLE            IVC RV S prime:     8.45 cm/s  IVC diam: 2.60 cm TAPSE (M-mode): 1.5 cm LEFT ATRIUM           Index        RIGHT ATRIUM           Index LA diam:      3.50 cm 1.78 cm/m   RA Area:     18.70 cm LA Vol (A2C): 55.4 ml 28.11 ml/m  RA Volume:   46.30 ml  23.52 ml/m LA Vol (A4C): 52.7 ml 26.77 ml/m  AORTIC VALVE AV Area (Vmax): 2.50 cm AV Vmax:        124.00 cm/s AV Peak Grad:   6.2 mmHg LVOT Vmax:      98.50 cm/s LVOT Vmean:     59.200 cm/s LVOT VTI:       0.140 m  AORTA Ao Root diam: 3.00 cm Ao Asc diam:  3.40 cm MITRAL VALVE                TRICUSPID VALVE MV Area (PHT): 5.75 cm     TR Peak grad:   36.0 mmHg MV Decel Time: 132 msec     TR Vmax:        300.00 cm/s MV E velocity: 101.00 cm/s                             SHUNTS                             Systemic VTI:  0.14 m                             Systemic Diam: 2.00 cm Toribio Fuel MD Electronically signed by Toribio Fuel MD Signature Date/Time: 05/05/2024/6:12:30 PM    Final      Assessment and  Plan:  Will try to simplify her medications to improve compliance.  Critically important to provide good ventricular rate control and anticoagulant protection from stroke.  With recovered LVEF other heart failure medications are not as critical.  Will try to keep her medications once daily and as inexpensive as possible.  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  LHC with nonobstructive CAD. - EF recovered 02/2023 EF this admission continues to show preserved biventricular function.  Mild concentric LVH, mildly elevated PASP, RVSP 41.  Mild to moderate TR, mild AR.  Euvolemic. GDMT: Start Toprol -XL 200 mg, spironolactone  25 mg.  BP has been labile here so will stop Entresto  and has had more issues with compliancy in financial restrictions.  Farxiga  also stopped. With more accurate blood pressures and has persistent hypertension would add back losartan  as this is more affordable. No need for diuretic currently.  Permanent atrial fibrillation Heart rates controlled less than 110.  Moderately dilated atria. Noncompliant with Xarelto  20mg  and with acute CVA here.  Encouraged better compliance. Start Toprol  XL 200 mg daily starting tomorrow.  If she develops RVR again may have to add back digoxin .  Nonobstructive CAD  Noted on [facility] 03/2021. No anginal complaints.  No aspirin  with DOAC, continue BB and rosuvastain 20mg . LDL 109 this admission. Would increase to 40mg  now she  has hx of stroke.   Acute CVA Likely cardioembolic and in the setting of noncompliance he with DOAC. Right MCA and ACA scattered infarcts s/p IR with TICI2b.  OSA Significant contributor to her permanent atrial fibrillation.  Would encourage CPAP compliancy if not already on it.   Risk Assessment/Risk Scores:  New [place]  Heart Association (NYHA) Functional Class NYHA Class II  CHA2DS2-VASc Score = 6  This indicates a 9.7% annual risk of stroke. The patient's score is  based upon: CHF History: 1 HTN History: 1 Diabetes History: 0 Stroke History: 2 Vascular Disease History: 1 Age Score: 0 Gender Score: 1   For questions or updates, please contact [facility] Please consult [email or web address] for contact info under    Signed, Jerel Balding, MD  05/09/2024 5:29 PM

## 2024-05-09 NOTE — Discharge Summary (Addendum)
 Stroke Discharge Summary  Patient ID: [name]   MRN: [id number]      DOB: 09-12-1966  Date of Admission: 05/05/2024 Date of Discharge: 05/09/2024  Attending Physician:  Stroke, Md, MD Consultant(s):    None  Patient's PCP:  Delbert Clam, MD  DISCHARGE PRIMARY DIAGNOSIS:  Stroke: right MCA and ACA scattered infarcts s/p IR with TICI2b, etiology: Likely cardioembolic in the setting of A-fib not compliant with Xarelto    Patient Active Problem List   Diagnosis Date Noted   Acute ischemic right MCA stroke (HCC) 05/05/2024   Stroke (cerebrum) (HCC) 05/05/2024   Type 2 diabetes mellitus without complication, without long-term current use of insulin  (HCC) 06/17/2021   Coronary artery disease involving native coronary artery of native heart without angina pectoris 04/10/2021   Chronic combined systolic and diastolic CHF (congestive heart failure) (HCC) 04/01/2021   Longstanding persistent atrial fibrillation (HCC) 04/16/2020   Secondary hypercoagulable state (HCC) 04/16/2020   Edema leg 05/17/2019   Severe obesity (BMI 35.0-39.9) with comorbidity (HCC) 04/04/2019   Prediabetes 04/04/2019   Seasonal allergic rhinitis due to pollen 12/14/2018   Chronic back pain 08/23/2018   OSA on CPAP 06/23/2018   Headache 01/29/2017   Refractive errors 04/15/2016   Medical non-compliance 11/06/2015   Hyperlipidemia LDL goal <70 11/06/2015   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   NSVT (nonsustained ventricular tachycardia) (HCC) 11/06/2015   History of CVA (cerebrovascular accident)    Claudication of both lower extremities (HCC) 03/22/2015   Chronic atrial fibrillation (HCC) 04/12/2014   Pap smear for cervical cancer screening 03/28/2014   Cigarette smoker 06/15/2013   Ventral hernia 06/15/2013   Chronic anticoagulation 02/28/2013   Hx-TIA (transient ischemic attack) 02/05/13 02/15/2013   Essential hypertension 02/15/2013     Allergies as of 05/09/2024       Reactions   Penicillins  Hives, Other (See Comments)   Unknown Has patient had a PCN reaction causing immediate rash, facial/tongue/throat swelling, SOB or lightheadedness with hypotension: No Has patient had a PCN reaction causing severe rash involving mucus membranes or skin necrosis: No Has patient had a PCN reaction that required hospitalization No Has patient had a PCN reaction occurring within the last 10 years: No If all of the above answers are NO, then may proceed with Cephalosporin use.        Medication List     STOP taking these medications    digoxin  0.25 MG tablet Commonly known as: LANOXIN    Entresto  24-26 MG Generic drug: sacubitril -valsartan    Farxiga  10 MG Tabs tablet Generic drug: dapagliflozin  propanediol   furosemide  20 MG tablet Commonly known as: LASIX    gabapentin  300 MG capsule Commonly known as: NEURONTIN    topiramate  25 MG tablet Commonly known as: TOPAMAX        TAKE these medications    Accu-Chek FastClix Lancets Misc USE AS INSTRUCTED TO CHECK BLOOD SUGAR ONCE DAILY.   Accu-Chek Guide Me w/Device Kit 1 kit by Does not apply route daily.   Accu-Chek Guide Test test strip Generic drug: glucose blood Use as instructed   FreeStyle Libre 3 [facility] Apply as directed to test blood glucose   FreeStyle Libre 3 Sensor Misc Place 1 sensor on the skin every 14 days. Use to check glucose continuously   metoprolol  200 MG 24 hr tablet Commonly known as: TOPROL -XL Take 1 tablet (200 mg total) by mouth daily. Take with or immediately following a meal. Start taking on: May 10, 2024 What changed: additional  instructions   Misc. Devices Misc Blood pressure monitor.  Diagnosis Hypertension   rivaroxaban  20 MG Tabs tablet Commonly known as: XARELTO  Take 1 tablet (20 mg total) by mouth daily with supper.   rosuvastatin  20 MG tablet Commonly known as: Crestor  Take 1 tablet (20 mg total) by mouth daily at 6 PM. What changed: when to take this    spironolactone  25 MG tablet Commonly known as: ALDACTONE  Take 1 tablet (25 mg total) by mouth daily.        LABORATORY STUDIES CBC    Component Value Date/Time   WBC 11.9 (H) 05/08/2024 0344   RBC 4.15 05/08/2024 0344   HGB 11.4 (L) 05/08/2024 0344   HCT 35.4 (L) 05/08/2024 0344   PLT 153 05/08/2024 0344   MCV 85.3 05/08/2024 0344   MCH 27.5 05/08/2024 0344   MCHC 32.2 05/08/2024 0344   RDW 14.6 05/08/2024 0344   LYMPHSABS 2.6 05/05/2024 1115   MONOABS 0.6 05/05/2024 1115   EOSABS 0.1 05/05/2024 1115   BASOSABS 0.1 05/05/2024 1115   CMP    Component Value Date/Time   NA 141 05/08/2024 0344   NA 138 04/13/2024 1621   K 4.2 05/08/2024 0344   CL 111 05/08/2024 0344   CO2 23 05/08/2024 0344   GLUCOSE 98 05/08/2024 0344   BUN 13 05/08/2024 0344   BUN 13 04/13/2024 1621   CREATININE 0.96 05/08/2024 0344   CREATININE 0.88 03/07/2016 1459   CALCIUM  8.7 (L) 05/08/2024 0344   PROT 7.1 05/06/2024 0559   PROT 8.1 04/13/2024 1621   ALBUMIN  3.3 (L) 05/06/2024 0559   ALBUMIN  4.3 04/13/2024 1621   AST 22 05/06/2024 0559   ALT 20 05/06/2024 0559   ALKPHOS 65 05/06/2024 0559   BILITOT 0.7 05/06/2024 0559   BILITOT 0.7 04/13/2024 1621   GFRNONAA >60 05/08/2024 0344   GFRNONAA 78 03/22/2015 1301   GFRAA >60 04/10/2020 0924   GFRAA >89 03/22/2015 1301   COAGS Lab Results  Component Value Date   INR 1.2 05/06/2024   INR 1.1 05/05/2024   INR 1.6 (H) 11/21/2021   Lipid Panel    Component Value Date/Time   CHOL 152 05/06/2024 0559   CHOL 201 (H) 04/13/2024 1621   TRIG 39 05/06/2024 0559   HDL 35 (L) 05/06/2024 0559   HDL 49 04/13/2024 1621   CHOLHDL 4.3 05/06/2024 0559   VLDL 8 05/06/2024 0559   LDLCALC 109 (H) 05/06/2024 0559   LDLCALC 140 (H) 04/13/2024 1621   HgbA1C  Lab Results  Component Value Date   HGBA1C 6.1 (H) 05/05/2024   Alcohol Level    Component Value Date/Time   ETH <15 05/05/2024 1115     SIGNIFICANT DIAGNOSTIC STUDIES MR BRAIN WO  CONTRAST Result Date: 05/06/2024 CLINICAL DATA:  Stroke, follow up EXAM: MRI HEAD WITHOUT CONTRAST TECHNIQUE: Multiplanar, multiecho pulse sequences of the brain and surrounding structures were obtained without intravenous contrast. COMPARISON:  Same day CT head. FINDINGS: Brain: Multifocal acute right MCA territory infarcts including infarcts in the right basal ganglia, right frontal and right parietal lobes. Associated edema without mass effect. No midline shift. Areas of petechial hemorrhage in the right parietal lobe. Small additional acute infarct in the splenium of the corpus callosum. No mass occupying acute hemorrhage. No hydrocephalus. No mass lesion. Remote left cerebellar infarcts. Patchy T2/FLAIR hyperintensities in the white matter, compatible with chronic microvascular ischemic disease. Vascular: Better evaluated on recent catheter arteriogram and CTA. Skull and upper cervical spine: Normal marrow signal.  Sinuses/Orbits: Clear sinuses.  No acute orbital findings. IMPRESSION: Acute right MCA territory infarcts with petechial hemorrhage, as described above. No mass effect. Electronically Signed   By: Gilmore GORMAN Molt M.D.   On: 05/06/2024 00:55   ECHOCARDIOGRAM COMPLETE Result Date: 05/05/2024    ECHOCARDIOGRAM REPORT   Patient Name:   CAMIRA GEIDEL Date of Exam: 05/05/2024 Medical Rec #:  [id number]     Height:       64.0 in Accession #:    [id number]    Weight:       203.0 lb Date of Birth:  02/10/1966      BSA:          1.969 m Patient Age:    57 years      BP:           126/76 mmHg Patient Gender: F             HR:           95 bpm. Exam Location:  Inpatient Procedure: 2D Echo, Cardiac Doppler and Color Doppler (Both Spectral and Color            Flow Doppler were utilized during procedure). Indications:    Stroke I63.9  History:        Patient has prior history of Echocardiogram examinations, most                 recent 02/27/2023. CHF, Angina, Stroke and TIA, Arrythmias:Atrial                  Fibrillation and Tachycardia; Risk Factors:Hypertension, Sleep                 Apnea, Diabetes, Current Smoker and Dyslipidemia.  Sonographer:    Thea Norlander RCS Referring Phys: ERIN C LEHNER IMPRESSIONS  1. Left ventricular ejection fraction, by estimation, is 60 to 65%. The left ventricle has normal function. The left ventricle has no regional wall motion abnormalities. There is mild concentric left ventricular hypertrophy. Left ventricular diastolic function could not be evaluated.  2. Right ventricular systolic function is normal. The right ventricular size is normal. There is mildly elevated pulmonary artery systolic pressure. The estimated right ventricular systolic pressure is 41.0 mmHg.  3. Left atrial size was moderately dilated.  4. Right atrial size was moderately dilated.  5. The mitral valve is normal in structure. Trivial mitral valve regurgitation. No evidence of mitral stenosis.  6. Tricuspid valve regurgitation is mild to moderate.  7. The aortic valve is tricuspid. There is mild calcification of the aortic valve. Aortic valve regurgitation is mild. No aortic stenosis is present.  8. The inferior vena cava is normal in size with greater than 50% respiratory variability, suggesting right atrial pressure of 3 mmHg. FINDINGS  Left Ventricle: Left ventricular ejection fraction, by estimation, is 60 to 65%. The left ventricle has normal function. The left ventricle has no regional wall motion abnormalities. The left ventricular internal cavity size was normal in size. There is  mild concentric left ventricular hypertrophy. Left ventricular diastolic function could not be evaluated due to atrial fibrillation. Left ventricular diastolic function could not be evaluated. Right Ventricle: The right ventricular size is normal. No increase in right ventricular wall thickness. Right ventricular systolic function is normal. There is mildly elevated pulmonary artery systolic pressure. The tricuspid  regurgitant velocity is 3.00  m/s, and with an assumed right atrial pressure of 5 mmHg, the estimated right ventricular systolic pressure is 41.0 mmHg.  Left Atrium: Left atrial size was moderately dilated. Right Atrium: Right atrial size was moderately dilated. Pericardium: There is no evidence of pericardial effusion. Mitral Valve: The mitral valve is normal in structure. Mild mitral annular calcification. Trivial mitral valve regurgitation. No evidence of mitral valve stenosis. Tricuspid Valve: The tricuspid valve is normal in structure. Tricuspid valve regurgitation is mild to moderate. No evidence of tricuspid stenosis. Aortic Valve: The aortic valve is tricuspid. There is mild calcification of the aortic valve. Aortic valve regurgitation is mild. No aortic stenosis is present. Aortic valve peak gradient measures 6.2 mmHg. Pulmonic Valve: The pulmonic valve was normal in structure. Pulmonic valve regurgitation is not visualized. No evidence of pulmonic stenosis. Aorta: The aortic root is normal in size and structure. Venous: The inferior vena cava is normal in size with greater than 50% respiratory variability, suggesting right atrial pressure of 3 mmHg. IAS/Shunts: No atrial level shunt detected by color flow Doppler.  LEFT VENTRICLE PLAX 2D LVIDd:         3.70 cm   Diastology LVIDs:         2.50 cm   LV e' medial:    6.16 cm/s LV PW:         1.20 cm   LV E/e' medial:  16.4 LV IVS:        1.30 cm   LV e' lateral:   12.40 cm/s LVOT diam:     2.00 cm   LV E/e' lateral: 8.1 LV SV:         44 LV SV Index:   22 LVOT Area:     3.14 cm  RIGHT VENTRICLE            IVC RV S prime:     8.45 cm/s  IVC diam: 2.60 cm TAPSE (M-mode): 1.5 cm LEFT ATRIUM           Index        RIGHT ATRIUM           Index LA diam:      3.50 cm 1.78 cm/m   RA Area:     18.70 cm LA Vol (A2C): 55.4 ml 28.11 ml/m  RA Volume:   46.30 ml  23.52 ml/m LA Vol (A4C): 52.7 ml 26.77 ml/m  AORTIC VALVE AV Area (Vmax): 2.50 cm AV Vmax:        124.00  cm/s AV Peak Grad:   6.2 mmHg LVOT Vmax:      98.50 cm/s LVOT Vmean:     59.200 cm/s LVOT VTI:       0.140 m  AORTA Ao Root diam: 3.00 cm Ao Asc diam:  3.40 cm MITRAL VALVE                TRICUSPID VALVE MV Area (PHT): 5.75 cm     TR Peak grad:   36.0 mmHg MV Decel Time: 132 msec     TR Vmax:        300.00 cm/s MV E velocity: 101.00 cm/s                             SHUNTS                             Systemic VTI:  0.14 m  Systemic Diam: 2.00 cm Toribio Fuel MD Electronically signed by Toribio Fuel MD Signature Date/Time: 05/05/2024/6:12:30 PM    Final    CT HEAD CODE STROKE WO CONTRAST Result Date: 05/05/2024 CLINICAL DATA:  Code stroke. Neuro deficit, acute, stroke suspected. Left-sided facial droop. Left arm weakness. EXAM: CT ANGIOGRAPHY HEAD AND NECK TECHNIQUE: Multidetector CT imaging of the head and neck was performed using the standard protocol during bolus administration of intravenous contrast. Multiplanar CT image reconstructions and MIPs were obtained to evaluate the vascular anatomy. Carotid stenosis measurements (when applicable) are obtained utilizing NASCET criteria, using the distal internal carotid diameter as the denominator. RADIATION DOSE REDUCTION: This exam was performed according to the departmental dose-optimization program which includes automated exposure control, adjustment of the mA and/or kV according to patient size and/or use of iterative reconstruction technique. CONTRAST:  75mL OMNIPAQUE  IOHEXOL  350 MG/ML SOLN COMPARISON:  Head CT 10/11/2022.  CTA head/neck 12/09/2017. FINDINGS: CT HEAD FINDINGS Brain: No age-advanced or lobar predominant cerebral atrophy. Known chronic infarcts within the posterior frontal lobe, parietal lobe and temporal lobe on the right (MCA vascular territory). Small chronic cerebellar infarcts also again demonstrated. There is no acute intracranial hemorrhage. No acute demarcated cortical infarct. No extra-axial fluid  collection. No evidence of an intracranial mass. No midline shift. Vascular: Hyperdense M1 right middle cerebral artery. Atherosclerotic calcifications. Skull: No calvarial fracture or aggressive osseous lesion. Sinuses/Orbits: No mass or acute finding within the imaged orbits. No significant paranasal sinus disease at the imaged levels. ASPECTS [facility] Stroke Program Early CT Score) - Ganglionic level infarction (caudate, lentiform nuclei, internal capsule, insula, M1-M3 cortex): 7 - Supraganglionic infarction (M4-M6 cortex): 3 Total score (0-10 with 10 being normal): 10 (when discounting chronic infarcts). Review of the MIP images confirms the above findings These results were called by telephone at the time of interpretation on 05/05/2024 at 11:37 am to provider MCNEILL [facility] , who verbally acknowledged these results. CTA NECK FINDINGS Aortic arch: Standard aortic branching. Atherosclerotic plaque within the aortic arch and proximal major branch vessels of the neck. Streak/beam hardening artifact arising from a dense contrast bolus partially obscures the right subclavian artery. Within this limitation, there is no appreciable hemodynamically significant innominate or proximal subclavian artery stenosis. Right carotid system: CCA and ICA patent within the neck. Progressive calcified and noncalcified atherosclerotic plaque within the proximal ICA resulting in less than 50% stenosis. Left carotid system: CCA and ICA patent within the neck. Calcified and noncalcified Nonstenotic atherosclerotic plaque at the CCA origin. Progressive calcified and noncalcified plaque within the proximal ICA resulting in 65% stenosis. Vertebral arteries: Patent within the neck without stenosis. The right vertebral artery is dominant. Skeleton: Spondylosis at the cervical and visualized upper thoracic levels. No acute fracture or aggressive osseous lesion. Other neck: No neck mass or cervical lymphadenopathy. Upper chest: No  consolidation within the imaged lung apices. Review of the MIP images confirms the above findings CTA HEAD FINDINGS Anterior circulation: The intracranial internal carotid arteries are patent. Nonstenotic atherosclerotic plaque within both vessels. 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 The left M1 segment is patent. No left M2 proximal branch occlusion or high-grade proximal stenosis identified. The anterior cerebral arteries are patent. No intracranial aneurysm is identified. Posterior circulation: The intracranial vertebral arteries are patent. The basilar artery is patent. The intracranial vertebral arteries and basilar artery are developmentally small and there are sizable posterior communicating arteries bilaterally. The posterior cerebral  arteries are patent. Hypoplastic P1 segments bilaterally. Venous sinuses: Within the limitations of contrast timing, no convincing thrombus. Anatomic variants: As described. Review of the MIP images confirms the above findings CTA head impression #1 called by telephone at the time of interpretation on 05/05/2024 at 11:37 am to provider MCNEILL [facility] , who verbally acknowledged these results. IMPRESSION: Non-contrast head CT: 1. No acute intracranial hemorrhage or acute demarcated cortical infarct. 2. Redemonstrated chronic infarcts within the right MCA vascular territory and within the cerebellum. CTA neck: 1. The common carotid and internal carotid arteries are patent within the neck. Plaque within the proximal right ICA resulting in less than 50% stenosis, progressed since the CTA of 02/08/2018. Progressive plaque within the proximal left ICA resulting in 65% stenosis. 2. Vertebral arteries patent within neck without stenosis. 3. Aortic Atherosclerosis (ICD10-I70.0). CTA head: 1. Abrupt occlusion of the right middle cerebral artery mid-to-distal M1 segment, new from the prior CTA of  02/08/2018. 2. Non-stenotic atherosclerotic plaque within the intracranial ICAs. Electronically Signed   By: Rockey Childs D.O.   On: 05/05/2024 11:47   CT ANGIO HEAD NECK W WO CM (CODE STROKE) Result Date: 05/05/2024 CLINICAL DATA:  Code stroke. Neuro deficit, acute, stroke suspected. Left-sided facial droop. Left arm weakness. EXAM: CT ANGIOGRAPHY HEAD AND NECK TECHNIQUE: Multidetector CT imaging of the head and neck was performed using the standard protocol during bolus administration of intravenous contrast. Multiplanar CT image reconstructions and MIPs were obtained to evaluate the vascular anatomy. Carotid stenosis measurements (when applicable) are obtained utilizing NASCET criteria, using the distal internal carotid diameter as the denominator. RADIATION DOSE REDUCTION: This exam was performed according to the departmental dose-optimization program which includes automated exposure control, adjustment of the mA and/or kV according to patient size and/or use of iterative reconstruction technique. CONTRAST:  75mL OMNIPAQUE  IOHEXOL  350 MG/ML SOLN COMPARISON:  Head CT 10/11/2022.  CTA head/neck 12/09/2017. FINDINGS: CT HEAD FINDINGS Brain: No age-advanced or lobar predominant cerebral atrophy. Known chronic infarcts within the posterior frontal lobe, parietal lobe and temporal lobe on the right (MCA vascular territory). Small chronic cerebellar infarcts also again demonstrated. There is no acute intracranial hemorrhage. No acute demarcated cortical infarct. No extra-axial fluid collection. No evidence of an intracranial mass. No midline shift. Vascular: Hyperdense M1 right middle cerebral artery. Atherosclerotic calcifications. Skull: No calvarial fracture or aggressive osseous lesion. Sinuses/Orbits: No mass or acute finding within the imaged orbits. No significant paranasal sinus disease at the imaged levels. ASPECTS [facility] Stroke Program Early CT Score) - Ganglionic level infarction (caudate, lentiform  nuclei, internal capsule, insula, M1-M3 cortex): 7 - Supraganglionic infarction (M4-M6 cortex): 3 Total score (0-10 with 10 being normal): 10 (when discounting chronic infarcts). Review of the MIP images confirms the above findings These results were called by telephone at the time of interpretation on 05/05/2024 at 11:37 am to provider MCNEILL [facility] , who verbally acknowledged these results. CTA NECK FINDINGS Aortic arch: Standard aortic branching. Atherosclerotic plaque within the aortic arch and proximal major branch vessels of the neck. Streak/beam hardening artifact arising from a dense contrast bolus partially obscures the right subclavian artery. Within this limitation, there is no appreciable hemodynamically significant innominate or proximal subclavian artery stenosis. Right carotid system: CCA and ICA patent within the neck. Progressive calcified and noncalcified atherosclerotic plaque within the proximal ICA resulting in less than 50% stenosis. Left carotid system: CCA and ICA patent within the neck. Calcified and noncalcified Nonstenotic atherosclerotic plaque at the CCA origin. Progressive calcified and noncalcified  plaque within the proximal ICA resulting in 65% stenosis. Vertebral arteries: Patent within the neck without stenosis. The right vertebral artery is dominant. Skeleton: Spondylosis at the cervical and visualized upper thoracic levels. No acute fracture or aggressive osseous lesion. Other neck: No neck mass or cervical lymphadenopathy. Upper chest: No consolidation within the imaged lung apices. Review of the MIP images confirms the above findings CTA HEAD FINDINGS Anterior circulation: The intracranial internal carotid arteries are patent. Nonstenotic atherosclerotic plaque within both vessels. 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 The left M1 segment is patent. No left M2 proximal branch occlusion or  high-grade proximal stenosis identified. The anterior cerebral arteries are patent. No intracranial aneurysm is identified. Posterior circulation: The intracranial vertebral arteries are patent. The basilar artery is patent. The intracranial vertebral arteries and basilar artery are developmentally small and there are sizable posterior communicating arteries bilaterally. The posterior cerebral arteries are patent. Hypoplastic P1 segments bilaterally. Venous sinuses: Within the limitations of contrast timing, no convincing thrombus. Anatomic variants: As described. Review of the MIP images confirms the above findings CTA head impression #1 called by telephone at the time of interpretation on 05/05/2024 at 11:37 am to provider [facility] , who verbally acknowledged these results. IMPRESSION: Non-contrast head CT: 1. No acute intracranial hemorrhage or acute demarcated cortical infarct. 2. Redemonstrated chronic infarcts within the right MCA vascular territory and within the cerebellum. CTA neck: 1. The common carotid and internal carotid arteries are patent within the neck. Plaque within the proximal right ICA resulting in less than 50% stenosis, progressed since the CTA of 02/08/2018. Progressive plaque within the proximal left ICA resulting in 65% stenosis. 2. Vertebral arteries patent within neck without stenosis. 3. Aortic Atherosclerosis (ICD10-I70.0). CTA head: 1. 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 2. Non-stenotic atherosclerotic plaque within the intracranial ICAs. Electronically Signed   By: Rockey Childs D.O.   On: 05/05/2024 11:47       HISTORY OF PRESENT ILLNESS 58 y.o. patient with history of right MCA stroke in 2019, right temporoparietal lobe stroke in 2017, A-fib on Xarelto  and CHF was admitted with acute onset left-sided weakness.  HOSPITAL COURSE Patient was taken to interventional radiology and underwent  mechanical thrombectomy for left MCA and ACA occlusion.  She was found on MRI to have acute right MCA and ACA territory infarcts and is now stable to be transferred to rehab.  Patient was noted to be noncompliant with her medications at home, partially due to cost and partially due to forgetting medications.  Family intends to help her with remembering medications.  Discussed resumption of GDMT with cardiology.   Stroke:  right MCA and ACA scattered infarcts s/p IR with TICI2b, etiology: Likely cardioembolic in the setting of A-fib not compliant with Xarelto   Code Stroke CT head - No acute finding. Redemonstrated chronic infarcts within the right MCA vascular territory and within the cerebellum. CTA head & neck Abrupt occlusion of the right middle cerebral artery mid-to-distal M1 segment. Plaque within the proximal right ICA resulting in less than 50% stenosis, progressed since the CTA of 02/08/2018. Progressive plaque within the proximal left ICA resulting in 65% stenosis. 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  Status post TNK.  MRI showed right temporal lobe infarct.  CTA head and neck right M2 occlusion.  CT perfusion negative.  EF 60 to 65%, LDL 89, A1c 6.5.  Patient noncompliant with Xarelto  at that time.  Discharged on Xarelto . 01/2018 admitted for right MCA infarct.  CT head and neck no LVO.  EF 60 to 65%.  LDL 102, A1c 6.1.  Xarelto  continued.  Increase Lipitor  from 20 to 80.   Hypertension Nonischemic cardiomyopathy Combined  HFrEF Home meds GDMT: Farxiga , digoxin , Lasix , Entresto , Aldactone -reports noncompliance with meds EF 25-30% in 03/2021 This admission EF 60 to 65% On metoprolol  and spironolactone  BP stable Long-term BP goal normotensive Discussed GDMT with cardiology, will resume Xarelto , spironolactone  and metoprolol    Hyperlipidemia Home med: Crestor  20-reports noncompliance LDL 109, goal < 70 Resume Crestor  20 Continue statin at discharge   Dysphagia Patient has post-stroke dysphagia, SLP consulted On dysphagia 2 and thin liquid Advance diet as tolerated   Other stroke risk factors Medication noncompliance Obesity, BMI 34.84   Other medical issues Leukocytosis WBC 14.2 -> 13.9 ->11.9    RN Pressure Injury Documentation:     DISCHARGE EXAM  PHYSICAL EXAM General:  Alert, well-nourished, well-developed patient in no acute distress Psych:  Mood and affect appropriate for situation Respiratory:  Regular, unlabored respirations on room air  NEURO:  Mental Status: AA&Ox3  Speech/Language: speech is without dysarthria or aphasia but with paucity of speech  Cranial Nerves:  II: PERRL. Visual fields full.  III, IV, VI: EOMI. Eyelids elevate symmetrically.  V: Sensation is intact to light touch and symmetrical to face.  VII: Left facial droop VIII: hearing intact to voice. IX, X:  Phonation is normal.  XII: tongue is midline without fasciculations. Motor: Able to move right upper and lower extremities with antigravity strength, no movement of left upper and lower extremities Tone: is normal and bulk is normal Sensation- Intact to light touch on the right, diminished to left lower extremity and absent to left upper extremity Coordination: Unable to perform Gait- deferred  1a Level of Conscious.: 0 1b LOC Questions: 0 1c LOC Commands: 0 2 Best Gaze: 0 3 Visual: 0 4 Facial Palsy: 1 5a Motor Arm - left: 4 5b Motor Arm - Right: 0 6a Motor Leg - Left: 4 6b Motor Leg - Right: 2 7  Limb Ataxia: 0 8 Sensory: 2 9 Best Language: 0 10 Dysarthria: 0 11 Extinct. and Inatten.: 0 TOTAL: 13   Discharge Diet       Diet   DIET DYS 2 Room service appropriate? Yes with Assist; Fluid consistency: Thin   liquids  DISCHARGE PLAN Disposition: Rehab Xarelto  (rivaroxaban ) daily for secondary stroke prevention  Ongoing stroke risk factor control by Primary Care Physician at time of discharge Follow-up PCP Delbert Clam, MD in 2 weeks. Follow up with cardiology Follow-up in [facility] with nurse practitioner in 8 weeks, office to schedule an appointment.   35 minutes were spent preparing discharge.  Cortney E Everitt Clint Kill , MSN, AGACNP-BC [facility] See Amion for schedule and pager information 05/09/2024 4:26 PM  I have personally obtained history,examined this patient, reviewed notes, independently viewed imaging studies, participated in medical decision making and plan of  care.ROS completed by me personally and pertinent positives fully documented  I have made any additions or clarifications directly to the above note. Agree with note above.    Eather Popp, MD Medical Director [facility] Pager: [phone] 05/09/2024 4:44 PM

## 2024-05-09 NOTE — Discharge Instructions (Signed)
Information on my medicine - XARELTO® (Rivaroxaban) ° °This medication education was reviewed with me or my healthcare representative as part of my discharge preparation.    ° °Why was Xarelto® prescribed for you? °Xarelto® was prescribed for you to reduce the risk of a blood clot forming that can cause a stroke if you have a medical condition called atrial fibrillation (a type of irregular heartbeat). ° °What do you need to know about xarelto® ? °Take your Xarelto® ONCE DAILY at the same time every day with your evening meal. °If you have difficulty swallowing the tablet whole, you may crush it and mix in applesauce just prior to taking your dose. ° °Take Xarelto® exactly as prescribed by your doctor and DO NOT stop taking Xarelto® without talking to the doctor who prescribed the medication.  Stopping without other stroke prevention medication to take the place of Xarelto® may increase your risk of developing a clot that causes a stroke.  Refill your prescription before you run out. ° °After discharge, you should have regular check-up appointments with your healthcare provider that is prescribing your Xarelto®.  In the future your dose may need to be changed if your kidney function or weight changes by a significant amount. ° °What do you do if you miss a dose? °If you are taking Xarelto® ONCE DAILY and you miss a dose, take it as soon as you remember on the same day then continue your regularly scheduled once daily regimen the next day. Do not take two doses of Xarelto® at the same time or on the same day.  ° °Important Safety Information °A possible side effect of Xarelto® is bleeding. You should call your healthcare provider right away if you experience any of the following: °Bleeding from an injury or your nose that does not stop. °Unusual colored urine (red or dark brown) or unusual colored stools (red or black). °Unusual bruising for unknown reasons. °A serious fall or if you hit your head (even if there is  no bleeding). ° °Some medicines may interact with Xarelto® and might increase your risk of bleeding while on Xarelto®. To help avoid this, consult your healthcare provider or pharmacist prior to using any new prescription or non-prescription medications, including herbals, vitamins, non-steroidal anti-inflammatory drugs (NSAIDs) and supplements. ° °This website has more information on Xarelto®: www.xarelto.com. °==================================================== ° °Atrial Fibrillation ° °  °Atrial fibrillation is a type of heartbeat that is irregular or fast. If you have this condition, your heart beats without any order. This makes it hard for your heart to pump blood in a normal way. °Atrial fibrillation may come and go, or it may become a long-lasting problem. If this condition is not treated, it can put you at higher risk for stroke, heart failure, and other heart problems. ° °What are the causes? °This condition may be caused by diseases that damage the heart. They include: °High blood pressure. °Heart failure. °Heart valve disease. °Heart surgery. °Other causes include: °Diabetes. °Thyroid disease. °Being overweight. °Kidney disease. °Sometimes the cause is not known. ° °What increases the risk? °You are more likely to develop this condition if: °You are older. °You smoke. °You exercise often and very hard. °You have a family history of this condition. °You are a man. °You use drugs. °You drink a lot of alcohol. °You have lung conditions, such as emphysema, pneumonia, or COPD. °You have sleep apnea. ° °What are the signs or symptoms? °Common symptoms of this condition include: °A feeling that your heart   is beating very fast. °Chest pain or discomfort. °Feeling short of breath. °Suddenly feeling light-headed or weak. °Getting tired easily during activity. °Fainting. °Sweating. °In some cases, there are no symptoms. ° °How is this treated? °Treatment for this condition depends on underlying conditions and how  you feel when you have atrial fibrillation. They include: °Medicines to: °Prevent blood clots. °Treat heart rate or heart rhythm problems. °Using devices, such as a pacemaker, to correct heart rhythm problems. °Doing surgery to remove the part of the heart that sends bad signals. °Closing an area where clots can form in the heart (left atrial appendage). °In some cases, your doctor will treat other underlying conditions. ° °Follow these instructions at home: ° °Medicines °Take over-the-counter and prescription medicines only as told by your doctor. °Do not take any new medicines without first talking to your doctor. °If you are taking blood thinners: °Talk with your doctor before you take any medicines that have aspirin or NSAIDs, such as ibuprofen, in them. °Take your medicine exactly as told by your doctor. Take it at the same time each day. °Avoid activities that could hurt or bruise you. Follow instructions about how to prevent falls. °Wear a bracelet that says you are taking blood thinners. Or, carry a card that lists what medicines you take. °Lifestyle ° °  °  ° °  °Do not use any products that have nicotine or tobacco in them. These include cigarettes, e-cigarettes, and chewing tobacco. If you need help quitting, ask your doctor. °Eat heart-healthy foods. Talk with your doctor about the right eating plan for you. °Exercise regularly as told by your doctor. °Do not drink alcohol. °Lose weight if you are overweight. °Do not use drugs, including cannabis. ° °General instructions °If you have a condition that causes breathing to stop for a short period of time (apnea), treat it as told by your doctor. °Keep a healthy weight. Do not use diet pills unless your doctor says they are safe for you. Diet pills may make heart problems worse. °Keep all follow-up visits as told by your doctor. This is important. ° °Contact a doctor if: °You notice a change in the speed, rhythm, or strength of your heartbeat. °You are  taking a blood-thinning medicine and you get more bruising. °You get tired more easily when you move or exercise. °You have a sudden change in weight. ° °Get help right away if: ° °  °You have pain in your chest or your belly (abdomen). °You have trouble breathing. °You have side effects of blood thinners, such as blood in your vomit, poop (stool), or pee (urine), or bleeding that cannot stop. °You have any signs of a stroke. "BE FAST" is an easy way to remember the main warning signs: °B - Balance. Signs are dizziness, sudden trouble walking, or loss of balance. °E - Eyes. Signs are trouble seeing or a change in how you see. °F - Face. Signs are sudden weakness or loss of feeling in the face, or the face or eyelid drooping on one side. °A - Arms. Signs are weakness or loss of feeling in an arm. This happens suddenly and usually on one side of the body. °S - Speech. Signs are sudden trouble speaking, slurred speech, or trouble understanding what people say. °T - Time. Time to call emergency services. Write down what time symptoms started. °You have other signs of a stroke, such as: °A sudden, very bad headache with no known cause. °Feeling like you may vomit (nausea). °  Vomiting. °A seizure. ° °These symptoms may be an emergency. Do not wait to see if the symptoms will go away. Get medical help right away. Call your local emergency services (911 in the [place] Do not drive yourself to the hospital. °Summary °Atrial fibrillation is a type of heartbeat that is irregular or fast. °You are at higher risk of this condition if you smoke, are older, have diabetes, or are overweight. °Follow your doctor's instructions about medicines, diet, exercise, and follow-up visits. °Get help right away if you have signs or symptoms of a stroke. °Get help right away if you cannot catch your breath, or you have chest pain or discomfort. °This information is not intended to replace advice given to you by your health care provider. Make  sure you discuss any questions you have with your health care provider. °Document Revised: 02/23/2019 Document Reviewed: 02/23/2019 °Elsevier Patient Education © 2020 Elsevier Inc. °  ° °

## 2024-05-10 ENCOUNTER — Encounter (HOSPITAL_COMMUNITY): Payer: Self-pay | Admitting: Physical Medicine & Rehabilitation

## 2024-05-10 ENCOUNTER — Other Ambulatory Visit: Payer: Self-pay

## 2024-05-10 DIAGNOSIS — K5901 Slow transit constipation: Secondary | ICD-10-CM

## 2024-05-10 DIAGNOSIS — I63521 Cerebral infarction due to unspecified occlusion or stenosis of right anterior cerebral artery: Secondary | ICD-10-CM | POA: Diagnosis not present

## 2024-05-10 DIAGNOSIS — G8111 Spastic hemiplegia affecting right dominant side: Secondary | ICD-10-CM

## 2024-05-10 DIAGNOSIS — I10 Essential (primary) hypertension: Secondary | ICD-10-CM | POA: Diagnosis not present

## 2024-05-10 DIAGNOSIS — D72829 Elevated white blood cell count, unspecified: Secondary | ICD-10-CM | POA: Diagnosis not present

## 2024-05-10 LAB — COMPREHENSIVE METABOLIC PANEL WITH GFR
ALT: 19 U/L (ref 0–44)
AST: 24 U/L (ref 15–41)
Albumin: 3.1 g/dL — ABNORMAL LOW (ref 3.5–5.0)
Alkaline Phosphatase: 69 U/L (ref 38–126)
Anion gap: 10 (ref 5–15)
BUN: 11 mg/dL (ref 6–20)
CO2: 23 mmol/L (ref 22–32)
Calcium: 9.4 mg/dL (ref 8.9–10.3)
Chloride: 104 mmol/L (ref 98–111)
Creatinine, Ser: 0.97 mg/dL (ref 0.44–1.00)
GFR, Estimated: >60 mL/min (ref >60)
Glucose, Bld: 102 mg/dL — ABNORMAL HIGH (ref 70–99)
Potassium: 3.8 mmol/L (ref 3.5–5.1)
Sodium: 137 mmol/L (ref 135–145)
Total Bilirubin: 0.9 mg/dL (ref 0.0–1.2)
Total Protein: 5.5 g/dL — ABNORMAL LOW (ref 6.5–8.1)

## 2024-05-10 LAB — GLUCOSE, CAPILLARY
Glucose-Capillary: 116 mg/dL — ABNORMAL HIGH (ref 70–99)
Glucose-Capillary: 115 mg/dL — ABNORMAL HIGH (ref 70–99)
Glucose-Capillary: 112 mg/dL — ABNORMAL HIGH (ref 70–99)
Glucose-Capillary: 104 mg/dL — ABNORMAL HIGH (ref 70–99)

## 2024-05-10 LAB — CBC WITH DIFFERENTIAL/PLATELET
Abs Immature Granulocytes: 0.03 K/uL (ref 0.00–0.07)
Basophils Absolute: 0 K/uL (ref 0.0–0.1)
Basophils Relative: 0 %
Eosinophils Absolute: 0.1 K/uL (ref 0.0–0.5)
Eosinophils Relative: 1 %
HCT: 41.7 % (ref 36.0–46.0)
Hemoglobin: 13.3 g/dL (ref 12.0–15.0)
Immature Granulocytes: 0 %
Lymphocytes Relative: 15 %
Lymphs Abs: 1.8 K/uL (ref 0.7–4.0)
MCH: 26.3 pg (ref 26.0–34.0)
MCHC: 31.9 g/dL (ref 30.0–36.0)
MCV: 82.6 fL (ref 80.0–100.0)
Monocytes Absolute: 0.9 K/uL (ref 0.1–1.0)
Monocytes Relative: 7 %
Neutro Abs: 9.3 K/uL — ABNORMAL HIGH (ref 1.7–7.7)
Neutrophils Relative %: 77 %
Platelets: 206 K/uL (ref 150–400)
RBC: 5.05 MIL/uL (ref 3.87–5.11)
RDW: 14.2 % (ref 11.5–15.5)
WBC: 12.1 K/uL — ABNORMAL HIGH (ref 4.0–10.5)
nRBC: 0 % (ref 0.0–0.2)

## 2024-05-10 NOTE — Plan of Care (Signed)
  Problem: RH Swallowing Goal: LTG Patient will consume least restrictive diet using compensatory strategies with assistance (SLP) Description: LTG:  Patient will consume least restrictive diet using compensatory strategies with assistance (SLP) Flowsheets (Taken 05/10/2024 1632) LTG: Pt Patient will consume least restrictive diet using compensatory strategies with assistance of (SLP): Modified Independent

## 2024-05-10 NOTE — Progress Notes (Signed)
 Orthopedic Tech Progress Note Patient Details:  [name] 07-12-1966 [id number]  Called in order to [employer] for a LEFT PRAFO BOOT, & RESTING WHO   Patient ID: [name], female   DOB: February 23, 1966, 58 y.o.   MRN: [id number]  [name] Pac 05/10/2024, 11:01 AM

## 2024-05-10 NOTE — Progress Notes (Signed)
 Inpatient Rehabilitation Care Coordinator Assessment and Plan Patient Details  Name: [name] MRN: [id number] Date of Birth: 1965-10-20  Today's Date: 05/10/2024  Hospital Problems: Principal Problem:   Acute right ACA stroke [facility])  Past Medical History:  Past Medical History:  Diagnosis Date   Clotting disorder (HCC)    on xarelto    Essential hypertension    Heart murmur    a. 10/2015 Echo: EF 60-65%, no rwma, mild AI/MR, sev dil LA/RA, PASP .   Noncompliance    NSVT (nonsustained ventricular tachycardia) (HCC)    a. 10/2015 during admission for CVA/AF.   Persistent atrial fibrillation (HCC)    a. CHA2DS2VASC = 4-->Xarelto ;  b. 02/2016 Successful DCCV.  c. Recurrent fib   Pneumonia 2012 x 2; 2015   Stroke [facility])    a. 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   Transient ischemic attack (TIA) 01/2013   Past Surgical History:  Past Surgical History:  Procedure Laterality Date   CARDIOVERSION N/A 02/18/2013   Procedure: CARDIOVERSION;  Surgeon: Jerel Balding, MD;  Location: [facility] ENDOSCOPY;  Service: Cardiovascular;  Laterality: N/A;   CARDIOVERSION N/A 02/23/2013   Procedure: CARDIOVERSION;  Surgeon: Alm LELON Clay, MD;  Location: [facility] OR;  Service: Cardiovascular;  Laterality: N/A;  BESIDE CV   CARDIOVERSION N/A 03/12/2016   Procedure: CARDIOVERSION;  Surgeon: Ezra GORMAN Shuck, MD;  Location: [facility] ENDOSCOPY;  Service: Cardiovascular;  Laterality: N/A;   RADIOLOGY WITH ANESTHESIA N/A 05/05/2024   Procedure: RADIOLOGY WITH ANESTHESIA;  Surgeon: Radiologist, Medication, MD;  Location: [facility] OR;  Service: Radiology;  Laterality: N/A;   RIGHT/LEFT HEART CATH AND CORONARY ANGIOGRAPHY N/A 04/02/2021   Procedure: RIGHT/LEFT HEART CATH AND CORONARY ANGIOGRAPHY;  Surgeon: Claudene Victory LELON, MD;  Location: [facility] INVASIVE CV LAB;  Service: Cardiovascular;  Laterality: N/A;   TEE WITHOUT CARDIOVERSION N/A 02/18/2013    Procedure: TRANSESOPHAGEAL ECHOCARDIOGRAM (TEE);  Surgeon: Jerel Balding, MD;  Location: [facility] ENDOSCOPY;  Service: Cardiovascular;  Laterality: N/A;   TUBAL LIGATION  1992   Social History:  reports that she quit smoking about 11 years ago. Her smoking use included cigarettes. She started smoking about 14 years ago. She has never used smokeless tobacco. She reports current alcohol use. She reports that she does not use drugs.  Family / Support Systems Marital Status: Single Patient Roles: Parent, Other (Comment) (grandmother and employee) Children: Latoya-daughter [phone] Amber-daughter [phone] [name]-daughter [phone] Other Supports: Angela-sister [phone] Anticipated Caregiver: [name] will be taking time off pt wathced her 58 yo while Hospital doctor worked nights Ability/Limitations of Caregiver: Hospital doctor to take [employer] and other family members. Will work on discharge plan Caregiver Availability: Other (Comment) (Family to come up with 24/7 plan) Family Dynamics: Close knit with all seven children-she has four girls and three boys. Most are local and involved with Mom  Social History Preferred language: English Religion: Baptist Cultural Background: NA Education: HS Health Literacy - How often do you need to have someone help you when you read instructions, pamphlets, or other written material from your doctor or pharmacy?: Never Writes: Yes Employment Status: Employed Name of Employer: [employer] Return to Work Plans: [place] depends upon progress and Emergency planning/management officer Issues: NA Guardian/Conservator: None-according to MD pt is capable of making her own decisione while here. Family plans to be here daily to provide support   Abuse/Neglect Abuse/Neglect Assessment Can Be Completed: Yes Physical Abuse: Denies Verbal Abuse: Denies Sexual Abuse: Denies Exploitation  of patient/patient's resources: Denies Self-Neglect: Denies  Patient response to: Social Isolation  - How often do you feel lonely or isolated from those around you?: Never  Emotional Status Pt's affect, behavior and adjustment status: Pt is exhausted from OT session this am. Received information from daughter-Amber who is present in her room. She reports Mom was very independent and worked and took care of her granddaughter while Hospital doctor worked nights. Amber is currently not working due to needs to provide care to 60 yo daughter. Recent Psychosocial Issues: other health issues has had previous CVA in 2019 and recovered Psychiatric History: No history due to young age will ask neuro-psych to see while here for coping Substance Abuse History: NA  Patient / Family Perceptions, Expectations & Goals Pt/Family understanding of illness & functional limitations: Daughter is able to explain her Mom's stroke and her deficits. She has spoken with the MD's involved and feels understands her plan moving forward. Pt wants to be kept updated on her plan moving forward. Premorbid pt/family roles/activities: mom, sister, grandmother, sibling, employee Anticipated changes in roles/activities/participation: resume Pt/family expectations/goals: Daughter states:  I hope she does well she is a very independent person and not one to ask for assist from others.  Community Resources [employer]: None Premorbid Home Care/DME Agencies: None Transportation available at discharge: self and now will rely upon family Is the patient able to respond to transportation needs?: Yes In the past 12 months, has lack of transportation kept you from medical appointments or from getting medications?: No In the past 12 months, has lack of transportation kept you from meetings, work, or from getting things needed for daily living?: No Resource referrals recommended: Neuropsychology  Discharge Planning Living Arrangements: Children, Other relatives Support Systems: Children, Other relatives, Friends/neighbors, Church/faith  community Type of Residence: Private residence Insurance Resources: Media planner (specify) ([employer] Medicaid) Financial Resources: Employment, Garment/textile technologist Screen Referred: No Living Expenses: Psychologist, sport and exercise Management: Patient, Family Does the patient have any problems obtaining your medications?: No Home Management: both she and daughter Patient/Family Preliminary Plans: Retrun home with Hospital doctor whom lives with her and her 66 yo granddaughter. Amber has FMLA forms to be completed so she can be off of work. Pt has other children who will be assisting also. Aware pt being evaluated and goals being set for stay here. Will update after team conference tomorrow Care Coordinator Barriers to Discharge: Insurance for SNF coverage Care Coordinator Anticipated Follow Up Needs: HH/OP  Clinical Impression Pleasant exhausted pt who had OT session. Will re-assess pt for her coping but feels will benefit from seeing neuro-psych while here. Amber daughter is very involved and will be assisting at home since they live together. FMLA papers given to Brandi-PA to be completed. Will work on discharge needs and await team conference tomorrow.  [name] 05/10/2024, 10:42 AM

## 2024-05-10 NOTE — Evaluation (Signed)
 Physical Therapy Assessment and Plan  Patient Details  Name: [name] MRN: [id number] Date of Birth: 12/29/1965  PT Diagnosis: Abnormal posture, Coordination disorder, Hemiparesis non-dominant, Hypotonia, and Muscle weakness Rehab Potential: Fair ELOS: 21-24 days.   Today's Date: 05/10/2024 PT Individual Time: 8669-8554 PT Individual Time Calculation (min): 75 min    Hospital Problem: Principal Problem:   Acute right ACA stroke [place])   Past Medical History:  Past Medical History:  Diagnosis Date   Clotting disorder (HCC)    on xarelto    Essential hypertension    Heart murmur    a. 10/2015 Echo: EF 60-65%, no rwma, mild AI/MR, sev dil LA/RA, PASP .   Noncompliance    NSVT (nonsustained ventricular tachycardia) (HCC)    a. 10/2015 during admission for CVA/AF.   Persistent atrial fibrillation (HCC)    a. CHA2DS2VASC = 4-->Xarelto ;  b. 02/2016 Successful DCCV.  c. Recurrent fib   Pneumonia 2012 x 2; 2015   Stroke [facility])    a. 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   Transient ischemic attack (TIA) 01/2013   Past Surgical History:  Past Surgical History:  Procedure Laterality Date   CARDIOVERSION N/A 02/18/2013   Procedure: CARDIOVERSION;  Surgeon: Jerel Balding, MD;  Location: [facility] ENDOSCOPY;  Service: Cardiovascular;  Laterality: N/A;   CARDIOVERSION N/A 02/23/2013   Procedure: CARDIOVERSION;  Surgeon: Alm LELON Clay, MD;  Location[facility] OR;  Service: Cardiovascular;  Laterality: N/A;  BESIDE CV   CARDIOVERSION N/A 03/12/2016   Procedure: CARDIOVERSION;  Surgeon: Ezra GORMAN Shuck, MD;  Location: [facility] ENDOSCOPY;  Service: Cardiovascular;  Laterality: N/A;   RADIOLOGY WITH ANESTHESIA N/A 05/05/2024   Procedure: RADIOLOGY WITH ANESTHESIA;  Surgeon: Radiologist, Medication, MD;  Location: [facility] OR;  Service: Radiology;  Laterality: N/A;   RIGHT/LEFT HEART CATH AND CORONARY ANGIOGRAPHY N/A 04/02/2021    Procedure: RIGHT/LEFT HEART CATH AND CORONARY ANGIOGRAPHY;  Surgeon: Claudene Victory LELON, MD;  Location: [facility] INVASIVE CV LAB;  Service: Cardiovascular;  Laterality: N/A;   TEE WITHOUT CARDIOVERSION N/A 02/18/2013   Procedure: TRANSESOPHAGEAL ECHOCARDIOGRAM (TEE);  Surgeon: Jerel Balding, MD;  Location: [facility] ENDOSCOPY;  Service: Cardiovascular;  Laterality: N/A;   TUBAL LIGATION  1992    Assessment & Plan Clinical Impression: [name] is a 58 year old female with history of HTN, Type 2 DM, R posterior MCA stroke (2019), R temporoparietal lobe with upper M2 cutoff 2017, and A. Fib on Xarelto  presented to [facility] by EMS as a Code Stroke on 05/05/2024 after acute onset of left-sided weakness while at work. Initial CT of the head was negative, but CTA of the head and neck showed an abrupt occlusion of the right M1 segment plaque with proximal right ICA stenosis, which had progressed since the last CTA in 2019. Neurology was consulted, and she underwent mechanical thrombectomy with TICI 2B revascularization performed by Dr. Rosslyn. The etiology was determined to be embolic, related to her history of atrial fibrillation (AFib) and noncompliance while on Xarelto . An MRI revealed acute right MCA territory infarcts with particular hemorrhage. The patient experienced dysphagia and was evaluated by speech therapy, leading to a recommendation for a dysphagia 2 diet with thin liquids. Prior to this event, she was independent with activities of daily living (ADLs), worked in [employer], drove, and lived in a one-level home with her daughter. Currently, she requires moderate assistance with mobility and basic ADLs. Cardiology has adjusted her  medications in order to simplify her regimen.  The patient is currently Mod A with mobility and basic ADLs. Therapy evaluations completed due to patient decreased functional mobility was admitted for a comprehensive rehab program.    Patient currently requires total  with mobility secondary to abnormal tone, decreased coordination, and decreased motor planning and decreased sitting balance, decreased standing balance, and decreased balance strategies.  Prior to hospitalization, patient was independent  with mobility and lived with Daughter in a House home.  Home access is 5Stairs to enter.  Patient will benefit from skilled PT intervention to maximize safe functional mobility, minimize fall risk, and decrease caregiver burden for planned discharge home with 24 hour supervision.  Anticipate patient will benefit from follow up HH at discharge.  PT - End of Session Activity Tolerance: Tolerates 10 - 20 min activity with multiple rests Endurance Deficit: Yes Endurance Deficit Description: pt closing eyes often PT Assessment Rehab Potential (ACUTE/IP ONLY): Fair PT Barriers to Discharge: Inaccessible home environment;Home environment access/layout PT Patient demonstrates impairments in the following area(s): Balance;Safety;Perception;Endurance;Motor PT Transfers Functional Problem(s): Bed Mobility;Bed to Chair;Car;Furniture PT Locomotion Functional Problem(s): Ambulation;Stairs PT Plan PT Intensity: Minimum of 1-2 x/day ,45 to 90 minutes PT Frequency: 5 out of 7 days PT Duration Estimated Length of Stay: 21-24 days. PT Treatment/Interventions: Ambulation/gait training;Community reintegration;Neuromuscular re-education;Stair training;UE/LE Strength taining/ROM;UE/LE Coordination activities;Therapeutic Activities;Discharge planning;Functional mobility training;Splinting/orthotics;Patient/family education;Therapeutic Exercise PT Transfers Anticipated Outcome(s): min A PT Locomotion Anticipated Outcome(s): Min A w/ LRAD PT Recommendation Follow Up Recommendations: Home health PT Patient destination: Home Equipment Recommended: To be determined Equipment Details: Pt has no DME PTA   PT Evaluation Precautions/Restrictions Precautions Precautions: Fall Recall  of Precautions/Restrictions: Impaired Restrictions Weight Bearing Restrictions Per Provider Order: No General Chart Reviewed: Yes Family/Caregiver Present: No Vital SignsTherapy Vitals Temp: (!) 97.4 F (36.3 C) Temp Source: Oral Pulse Rate: 66 Resp: 16 BP: 110/78 Patient Position (if appropriate): Lying Oxygen Therapy SpO2: 100 % O2 Device: Room Air Pain Pain Assessment Pain Scale: 0-10 Pain Score: 0-No pain Pain Interference Pain Interference Pain Effect on Sleep: 0. Does not apply - I have not had any pain or hurting in the past 5 days Pain Interference with Therapy Activities: 0. Does not apply - I have not received rehabilitationtherapy in the past 5 days Pain Interference with Day-to-Day Activities: 1. Rarely or not at all Home Living/Prior Functioning Home Living Available Help at Discharge: Family;Available 24 hours/day Type of Home: House Home Access: Stairs to enter [employer] of Steps: 5 Entrance Stairs-Rails: None Home Layout: One level Bathroom Shower/Tub: Engineer, manufacturing systems: Standard  Lives With: Daughter Prior Function Level of Independence: Independent with transfers;Independent with gait  Able to Take Stairs?: Yes Driving: Yes Vision/Perception  Vision - History Ability to See in Adequate Light: 2 Moderately impaired Vision - Assessment Eye Alignment: Within Functional Limits Ocular Range of Motion: Within Functional Limits Alignment/Gaze Preference: Gaze right Tracking/Visual Pursuits: Able to track stimulus in all quads without difficulty Perception Perception: Impaired Preception Impairment Details: Inattention/Neglect Praxis Praxis: Impaired Praxis Impairment Details: Motor planning  Cognition Overall Cognitive Status: Impaired/Different from baseline Arousal/Alertness: Lethargic Awareness: Impaired Problem Solving: Impaired Safety/Judgment: Impaired Sensation Sensation Light Touch: Impaired [place]  sensation comments: pt lethargic and unsure of answers. Light Touch Impaired Details: Impaired LUE;Impaired LLE Proprioception: Impaired by gross assessment Coordination Gross Motor Movements are Fluid and Coordinated: No Fine Motor Movements are Fluid and Coordinated: No Coordination and Movement Description: dense L hemiplegia with increased tone in LLE Finger  Nose Finger Test: fair on R , unable to with L Heel Shin Test: unable. Motor  Motor Motor: Hemiplegia;Abnormal tone;Abnormal postural alignment and control   Trunk/Postural Assessment  Cervical Assessment Cervical Assessment: Exceptions to [facility] (forward head, to Right) Thoracic Assessment Thoracic Assessment: Exceptions to [facility] (rounded shoulders.) Lumbar Assessment Lumbar Assessment: Exceptions to [facility] (posterior pelvic tilt.) Postural Control Postural Control: Deficits on evaluation Trunk Control: strong pusher to left but can sit w/o UE support x 1-2 by end of session. Righting Reactions: severely impaired Protective Responses: severely impaired  Balance Balance Balance Assessed: Yes Static Sitting Balance Static Sitting - Level of Assistance: 2: Max assist (to min A for shorter periods of time.) Dynamic Sitting Balance Dynamic Sitting - Level of Assistance: 1: +1 Total assist;2: Max assist Sitting balance - Comments: Heavy L lateral bias, able to correct with tactile and verbal cues Static Standing Balance Static Standing - Level of Assistance: 1: +2 Total assist;1: +1 Total assist Dynamic Standing Balance Dynamic Standing - Level of Assistance: Not tested (comment) Extremity Assessment  RUE Assessment RUE Assessment: Within Functional Limits LUE Assessment LUE Assessment: Exceptions to [facility] Passive Range of Motion (PROM) Comments: WFL Active Range of Motion (AROM) Comments: 0 General Strength Comments: 0 LUE Body System: Neuro Brunstrum levels for arm and hand: Arm;Hand Brunstrum level for arm: Stage I  Presynergy Brunstrum level for hand: Stage I Flaccidity LUE Tone LUE Tone: Flaccid RLE Assessment RLE Assessment: Within Functional Limits General Strength Comments: appears WFL although unable to formally test 2/2 balance deficits. LLE Assessment LLE Assessment: Exceptions to [facility] General Strength Comments: 0/5 strength, possible trace at hip but doesn't duplicate.  Care Tool Care Tool Bed Mobility Roll left and right activity   Roll left and right assist level: Total Assistance - Patient < 25%    Sit to lying activity   Sit to lying assist level: Total Assistance - Patient < 25%    Lying to sitting on side of bed activity   Lying to sitting on side of bed assist level: the ability to move from lying on the back to sitting on the side of the bed with no back support.: Total Assistance - Patient < 25%     Care Tool Transfers Sit to stand transfer   Sit to stand assist level: Total Assistance - Patient < 25%    Chair/bed transfer   Chair/bed transfer assist level: 2 Helpers (SPT or SB)    Licensed conveyancer transfer activity did not occur: Safety/medical concerns        Care Tool Locomotion Ambulation Ambulation activity did not occur: Safety/medical concerns        Walk 10 feet activity Walk 10 feet activity did not occur: Safety/medical concerns       Walk 50 feet with 2 turns activity Walk 50 feet with 2 turns activity did not occur: Safety/medical concerns      Walk 150 feet activity Walk 150 feet activity did not occur: Safety/medical concerns      Walk 10 feet on uneven surfaces activity Walk 10 feet on uneven surfaces activity did not occur: Safety/medical concerns      Stairs Stair activity did not occur: Safety/medical concerns        Walk up/down 1 step activity Walk up/down 1 step or curb (drop down) activity did not occur: Safety/medical concerns      Walk up/down 4 steps activity Walk up/down 4 steps activity did not occur: Safety/medical concerns  Walk up/down 12 steps activity Walk up/down 12 steps activity did not occur: Safety/medical concerns      Pick up small objects from floor Pick up small object from the floor (from standing position) activity did not occur: Safety/medical concerns      Wheelchair Is the patient using a wheelchair?: Yes Type of Wheelchair: Manual   Wheelchair assist level: Dependent - Patient 0% Max wheelchair distance: 150  Wheel 50 feet with 2 turns activity   Assist Level: Dependent - Patient 0%  Wheel 150 feet activity   Assist Level: Dependent - Patient 0%    Refer to Care Plan for Long Term Goals  SHORT TERM GOAL WEEK 1 PT Short Term Goal 1 (Week 1): Pt will transfer sup to sit w/ max A x 1. PT Short Term Goal 2 (Week 1): Pt will transfer bed <> w/c w/ max A x 1 consistently, SB or SPT. PT Short Term Goal 3 (Week 1): PT to assess gait. PT Short Term Goal 4 (Week 1): Pt to maintain seated balance w/o trunk support x 10' w/ min A.  Recommendations for other services: None   Skilled Therapeutic Intervention Evaluation completed (see details above and below) with education on PT POC and goals and individual treatment initiated with focus on  NMR L, endurance, transfers, seated/standing balance, progress to gait.  Pt presents semi-reclined in bed asleep and requires increased stim to awaken.  Closes eyes frequently during session.  Pt rolls side to side w/ total A for pulling shorts over hips after threading over feet.  Pt does attenmpt to pull up pants w/ cueing for bridging.  Pt transfers sup to sit w/ max/total A.  Pt w/ strong pushing tendencies to L requiring max A to maintain and cues for R lean.  Pt aware of deficits, not self-correcting.  W/ time and lowering down to R FA, pt improves to sitting somewhat midline w/ head turned to R.  Pt corrects w/ cues, but reverts to same.  Pt encouraged to look to left.  Pt performed SPT w/ max/total A and weight shifting and vc for R foot advancement  to w/c.  Pt wheeled to main gym and performed SB transfer w/c <> mat table w/ A + 2.  Pt able to correct to midline sitting w/ cues to touch shoulders.  Pt returned to room and performed SPT w/ max/total w/ decreased ability to advance R foot.  Pt returned to supine w/ bed alarm on and all needs in reach.   Mobility Bed Mobility Bed Mobility: Rolling Right;Rolling Left;Supine to Sit;Sit to Supine Rolling Right: Dependent - Patient equal 0% Rolling Left: Maximal Assistance - Patient 25-49% Supine to Sit: 2 Helpers Sit to Supine: 2 Helpers Transfers Transfers: Doctor, general practice Transfers: Total Assistance - Patient < 25%;2 Helpers Stand Pivot Transfer Details: Manual facilitation for weight shifting;Verbal cues for sequencing Stand Pivot Transfer Details (indicate cue type and reason): strong pusher to L. Squat Pivot Transfers: 2 Advertising account planner (Assistive device): Other (Comment) (SB w/ 2 helpers.) Locomotion  Gait Ambulation: No Gait Gait: No Stairs / Additional Locomotion Stairs: No Wheelchair Mobility Wheelchair Mobility: Yes Wheelchair Assistance: Dependent - Patient 0% Distance: 150   Discharge Criteria: Patient will be discharged from PT if patient refuses treatment 3 consecutive times without medical reason, if treatment goals not met, if there is a change in medical status, if patient makes no progress towards goals or if patient is discharged from hospital.  The above  assessment, treatment plan, treatment alternatives and goals were discussed and mutually agreed upon: by patient  Jadriel Saxer P Rachyl Wuebker 05/10/2024, 4:10 PM

## 2024-05-10 NOTE — Progress Notes (Signed)
 Inpatient Rehabilitation Center Individual Statement of Services  Patient Name:  [name]  Date:  05/10/2024  Welcome to the Inpatient Rehabilitation Center.  Our goal is to provide you with an individualized program based on your diagnosis and situation, designed to meet your specific needs.  With this comprehensive rehabilitation program, you will be expected to participate in at least 3 hours of rehabilitation therapies Monday-Friday, with modified therapy programming on the weekends.  Your rehabilitation program will include the following services:  Physical Therapy (PT), Occupational Therapy (OT), Speech Therapy (ST), 24 hour per day rehabilitation nursing, Therapeutic Recreaction (TR), Neuropsychology, Care Coordinator, Rehabilitation Medicine, Nutrition Services, and Pharmacy Services  Weekly team conferences will be held on Wednesday to discuss your progress.  Your Inpatient Rehabilitation Care Coordinator will talk with you frequently to get your input and to update you on team discussions.  Team conferences with you and your family in attendance may also be held.  Expected length of stay: 21-24 days  Overall anticipated outcome: min-cga level  Depending on your progress and recovery, your program may change. Your Inpatient Rehabilitation Care Coordinator will coordinate services and will keep you informed of any changes. Your Inpatient Rehabilitation Care Coordinator's name and contact numbers are listed  below.  The following services may also be recommended but are not provided by the Inpatient Rehabilitation Center:  Driving Evaluations Home Health Rehabiltiation Services Outpatient Rehabilitation Services Vocational Rehabilitation   Arrangements will be made to provide these services after discharge if needed.  Arrangements include referral to agencies that provide these services.  Your insurance has been verified to be:  [facility] Your primary doctor is:  Geophysicist/field seismologist  Pertinent information will be shared with your doctor and your insurance company.  Inpatient Rehabilitation Care Coordinator:  Rhoda Clement, KEN [phone] or ELIGAH BASQUES  Information discussed with and copy given to patient by: Clement Asberry MATSU, 05/10/2024, 10:46 AM

## 2024-05-10 NOTE — Progress Notes (Signed)
 Inpatient Rehabilitation  Patient information reviewed and entered into eRehab system by [facility]. Karen Kays., CCC/SLP, PPS Coordinator.  Information including medical coding, functional ability and quality indicators will be reviewed and updated through discharge.

## 2024-05-10 NOTE — Discharge Instructions (Addendum)
 Inpatient Rehab Discharge Instructions  [name] Discharge date and time: 06/07/24   Activities/Precautions/ Functional Status: Activity: no lifting, driving, or strenuous exercise for until cleared by provider  Diet: Dysphagia diet  Wound Care: none needed Functional status:  ___ No restrictions     ___ Walk up steps independently ___ 24/7 supervision/assistance   __x_ Walk up steps with assistance _x__ Intermittent supervision/assistance  ___ Bathe/dress independently ___ Walk with walker     __x_ Bathe/dress with assistance ___ Walk Independently    ___ Shower independently __x_ Walk with assistance    ___ Shower with assistance __x_ No alcohol     ___ Return to work/school ________  Special Instructions:    COMMUNITY REFERRALS UPON DISCHARGE:    Home Health:   PT    OT     SP                Agency:[facility]     Phone: [phone]    Medical Equipment/Items Ordered: DROP-ARM BEDSIDE COMMODE AND TUB BENCH                                                 Agency/Supplier: [employer]  [phone]                                                             [employer]   [phone]  TRANSPORTATION: [facility] [phone] CALL 24/48 HOURS IN ADVANCE TO SCHEDULE A RIDE  PCS REFERRAL MADE FOR ADDITIONAL AIDE SERVICES-ANGELA CROTTS-CASE MANAGER WORKING WITH DAUGHTER ON THIS    My questions have been answered and I understand these instructions. I will adhere to these goals and the provided educational materials after my discharge from the hospital.  Patient/Caregiver Signature _______________________________ Date __________  Clinician Signature _______________________________________ Date __________  Please bring this form and your medication list with you to all your follow-up doctor's appointments.

## 2024-05-10 NOTE — Plan of Care (Signed)
 Problem: Education: Goal: Knowledge of disease or condition will improve Outcome: Progressing Goal: Knowledge of secondary prevention will improve (MUST DOCUMENT ALL) Outcome: Progressing Goal: Knowledge of patient specific risk factors will improve (DELETE if not current risk factor) Outcome: Progressing   Problem: Ischemic Stroke/TIA Tissue Perfusion: Goal: Complications of ischemic stroke/TIA will be minimized Outcome: Progressing   Problem: Coping: Goal: Will verbalize positive feelings about self Outcome: Progressing Goal: Will identify appropriate support needs Outcome: Progressing   Problem: Health Behavior/Discharge Planning: Goal: Ability to manage health-related needs will improve Outcome: Progressing Goal: Goals will be collaboratively established with patient/family Outcome: Progressing   Problem: Self-Care: Goal: Ability to participate in self-care as condition permits will improve Outcome: Progressing Goal: Verbalization of feelings and concerns over difficulty with self-care will improve Outcome: Progressing Goal: Ability to communicate needs accurately will improve Outcome: Progressing   Problem: Nutrition: Goal: Risk of aspiration will decrease Outcome: Progressing Goal: Dietary intake will improve Outcome: Progressing   Problem: Education: Goal: Knowledge of General Education information will improve Description: Including pain rating scale, medication(s)/side effects and non-pharmacologic comfort measures Outcome: Progressing   Problem: Health Behavior/Discharge Planning: Goal: Ability to manage health-related needs will improve Outcome: Progressing   Problem: Clinical Measurements: Goal: Ability to maintain clinical measurements within normal limits will improve Outcome: Progressing Goal: Will remain free from infection Outcome: Progressing Goal: Diagnostic test results will improve Outcome: Progressing Goal: Respiratory complications will  improve Outcome: Progressing Goal: Cardiovascular complication will be avoided Outcome: Progressing   Problem: Nutrition: Goal: Adequate nutrition will be maintained Outcome: Progressing   Problem: Coping: Goal: Level of anxiety will decrease Outcome: Progressing   Problem: Elimination: Goal: Will not experience complications related to bowel motility Outcome: Progressing Goal: Will not experience complications related to urinary retention Outcome: Progressing   Problem: Pain Managment: Goal: General experience of comfort will improve and/or be controlled Outcome: Progressing   Problem: Safety: Goal: Ability to remain free from injury will improve Outcome: Progressing   Problem: Skin Integrity: Goal: Risk for impaired skin integrity will decrease Outcome: Progressing   Problem: Education: Goal: Understanding of CV disease, CV risk reduction, and recovery process will improve Outcome: Progressing Goal: Individualized Educational Video(s) Outcome: Progressing   Problem: Cardiovascular: Goal: Ability to achieve and maintain adequate cardiovascular perfusion will improve Outcome: Progressing Goal: Vascular access site(s) Level 0-1 will be maintained Outcome: Progressing   Problem: Health Behavior/Discharge Planning: Goal: Ability to safely manage health-related needs after discharge will improve Outcome: Progressing   Problem: Education: Goal: Ability to describe self-care measures that may prevent or decrease complications (Diabetes Survival Skills Education) will improve Outcome: Progressing Goal: Individualized Educational Video(s) Outcome: Progressing   Problem: Coping: Goal: Ability to adjust to condition or change in health will improve Outcome: Progressing   Problem: Fluid Volume: Goal: Ability to maintain a balanced intake and output will improve Outcome: Progressing   Problem: Health Behavior/Discharge Planning: Goal: Ability to identify and utilize  available resources and services will improve Outcome: Progressing Goal: Ability to manage health-related needs will improve Outcome: Progressing   Problem: Metabolic: Goal: Ability to maintain appropriate glucose levels will improve Outcome: Progressing   Problem: Nutritional: Goal: Maintenance of adequate nutrition will improve Outcome: Progressing Goal: Progress toward achieving an optimal weight will improve Outcome: Progressing   Problem: Skin Integrity: Goal: Risk for impaired skin integrity will decrease Outcome: Progressing   Problem: Tissue Perfusion: Goal: Adequacy of tissue perfusion will improve Outcome: Progressing

## 2024-05-10 NOTE — Plan of Care (Signed)
 Nutrition Education Note  RD consulted for nutrition education regarding a Heart Healthy/Carb Consistent diet. Consulted to provide education on general health nutrition. In person education attempted but pt sleeping at time of visit and would not awake. Left handouts at bedside and attached to AVS and spoke with daughter about follow up attempt to complete in person education.  Lipid Panel     Component Value Date/Time   CHOL 152 05/06/2024 0559   CHOL 201 (H) 04/13/2024 1621   TRIG 39 05/06/2024 0559   HDL 35 (L) 05/06/2024 0559   HDL 49 04/13/2024 1621   CHOLHDL 4.3 05/06/2024 0559   VLDL 8 05/06/2024 0559   LDLCALC 109 (H) 05/06/2024 0559   LDLCALC 140 (H) 04/13/2024 1621    RD provided Heart Healthy Consistent Carbohydrate Nutrition Therapy handout from the Academy [employer]. Pt sleeping and was not easily aroused during education. Daughter at bedside. Discussed handouts with daughter and discussed returning at a later time if feasible to provide in person education to pt. Attached education materials to AVS if RD unable to provide in person education prior to discharge. Provided examples on ways to decrease sodium and fat intake in diet. Discouraged intake of processed foods and use of salt shaker. Encouraged fresh fruits and vegetables as well as whole grain sources of carbohydrates to maximize fiber intake.   Expect fair compliance.  Body mass index is 34.06 kg/m. Pt meets criteria for obesity based on current BMI.  Current diet order is DYS 2/ Carb Mod, patient does not have any recorded meals at this time, but daughter states she is eating well. Labs and medications reviewed. No further nutrition interventions warranted at this time. RD contact information provided. If additional nutrition issues arise, please re-consult RD.  Josette Glance, MS, RDN, LDN Clinical Dietitian I Please reach out via secure chat

## 2024-05-10 NOTE — Progress Notes (Signed)
 Inpatient Rehabilitation Admission Medication Review by a Pharmacist  A complete drug regimen review was completed for this patient to identify any potential clinically significant medication issues.  High Risk Drug Classes Is patient taking? Indication by Medication  Antipsychotic Yes, as an intravenous medication Compazine  PRN- n/v   Anticoagulant Yes Rivaroxaban - AV/CVA   Antibiotic No   Opioid No   Antiplatelet No   Hypoglycemics/insulin  Yes Insulin - DM   Vasoactive Medication Yes Toprol - AF  Spironolactone - HTN/HF   Chemotherapy No   Other Yes fleet enema , sorbitol  , PEG , bisacodyl  , Senokot-S , and - constipation Maalox- indigestion Diphenhydramine - itching  Acetaminophen - pain  Robitussin- cough   trazodone  and -insomnia Crestor - HLD      Type of Medication Issue Identified Description of Issue Recommendation(s)  Drug Interaction(s) (clinically significant)     Duplicate Therapy     Allergy     No Medication Administration End Date     Incorrect Dose     Additional Drug Therapy Needed     Significant med changes from prior encounter (inform family/care partners about these prior to discharge). Per inpatient med rec, patient was not taking any of her prescribed medications PTA  Communicate relevant medication changes to patient/family members at discharge from CIR.   Restart or discontinue PTA meds not resumed in CIR at discharge if clinically indicated.   Other       Clinically significant medication issues were identified that warrant physician communication and completion of prescribed/recommended actions by midnight of the next day:  No  Name of provider notified for urgent issues identified:   Provider Method of Notification:     Pharmacist comments:   Time spent performing this drug regimen review (minutes):  25  Massie Fila, PharmD Clinical Pharmacist  05/10/2024 8:24 AM

## 2024-05-10 NOTE — Evaluation (Signed)
 Occupational Therapy Assessment and Plan  Patient Details  Name: [name] MRN: [id number] Date of Birth: April 23, 1966  OT Diagnosis: abnormal posture, apraxia, cognitive deficits, disturbance of vision, flaccid hemiplegia and hemiparesis, and hemiplegia affecting non-dominant side Rehab Potential: Rehab Potential (ACUTE ONLY): Good ELOS: 21-24 days   Today's Date: 05/10/2024 OT Individual Time: 9169-9054 OT Individual Time Calculation (min): 75 min     Pt seen for initial evaluation and ADL training with a focus on very basic motor control skills of rolling in bed, sitting to EOB and transfers.  LB self care performed in bed with +2 A for rolling, UB performed in w/c.   Used a squat pivot to w/c with heavy max A of 2.  Tried a stedy lift but not safe as pt is a strong pusher.  Therapy will need to focus on squat pivots or slide boards, recommend nursing to do maxi sky for now.  Reviewed role of OT, discussed POC and pt's goals, and ELOS. Pt resting in bed With all needs met and bed alarm set.     Hospital Problem: Principal Problem:   Acute right ACA stroke [facility])   Past Medical History:  Past Medical History:  Diagnosis Date   Clotting disorder (HCC)    on xarelto    Essential hypertension    Heart murmur    a. 10/2015 Echo: EF 60-65%, no rwma, mild AI/MR, sev dil LA/RA, PASP .   Noncompliance    NSVT (nonsustained ventricular tachycardia) (HCC)    a. 10/2015 during admission for CVA/AF.   Persistent atrial fibrillation (HCC)    a. CHA2DS2VASC = 4-->Xarelto ;  b. 02/2016 Successful DCCV.  c. Recurrent fib   Pneumonia 2012 x 2; 2015   Stroke [facility])    a. 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   Transient ischemic attack (TIA) 01/2013   Past Surgical History:  Past Surgical History:  Procedure Laterality Date   CARDIOVERSION N/A 02/18/2013   Procedure: CARDIOVERSION;  Surgeon: Jerel Balding, MD;  Location: [facility] ENDOSCOPY;  Service: Cardiovascular;  Laterality: N/A;   CARDIOVERSION N/A 02/23/2013   Procedure: CARDIOVERSION;  Surgeon: Alm LELON Clay, MD;  Location: [facility] OR;  Service: Cardiovascular;  Laterality: N/A;  BESIDE CV   CARDIOVERSION N/A 03/12/2016   Procedure: CARDIOVERSION;  Surgeon: Ezra GORMAN Shuck, MD;  Location: [facility] ENDOSCOPY;  Service: Cardiovascular;  Laterality: N/A;   RADIOLOGY WITH ANESTHESIA N/A 05/05/2024   Procedure: RADIOLOGY WITH ANESTHESIA;  Surgeon: Radiologist, Medication, MD;  Location: [facility] OR;  Service: Radiology;  Laterality: N/A;   RIGHT/LEFT HEART CATH AND CORONARY ANGIOGRAPHY N/A 04/02/2021   Procedure: RIGHT/LEFT HEART CATH AND CORONARY ANGIOGRAPHY;  Surgeon: Claudene Victory LELON, MD;  Location: [facility] INVASIVE CV LAB;  Service: Cardiovascular;  Laterality: N/A;   TEE WITHOUT CARDIOVERSION N/A 02/18/2013   Procedure: TRANSESOPHAGEAL ECHOCARDIOGRAM (TEE);  Surgeon: Jerel Balding, MD;  Location: [facility] ENDOSCOPY;  Service: Cardiovascular;  Laterality: N/A;   TUBAL LIGATION  1992    Assessment & Plan Clinical Impression: . [name] is a 58 year old female with history of HTN, Type 2 DM, R posterior MCA stroke (2019), R temporoparietal lobe with upper M2 cutoff 2017, and A. Fib on Xarelto  presented to [facility] by EMS as a Code Stroke on 05/05/2024 after acute onset of left-sided weakness while at work. Initial CT of the head was negative, but CTA of the head and neck showed an abrupt occlusion  of the right M1 segment plaque with proximal right ICA stenosis, which had progressed since the last CTA in 2019. Neurology was consulted, and she underwent mechanical thrombectomy with TICI 2B revascularization performed by Dr. Rosslyn. The etiology was determined to be embolic, related to her history of atrial fibrillation (AFib) and noncompliance while on Xarelto . An MRI revealed acute right MCA territory infarcts with particular hemorrhage. The patient experienced  dysphagia and was evaluated by speech therapy, leading to a recommendation for a dysphagia 2 diet with thin liquids. Prior to this event, she was independent with activities of daily living (ADLs), worked in [employer], drove, and lived in a one-level home with her daughter. Currently, she requires moderate assistance with mobility and basic ADLs. Cardiology has adjusted her medications in order to simplify her regimen.  The patient is currently Mod A with mobility and basic ADLs. Therapy evaluations completed due to patient decreased functional mobility was admitted for a comprehensive rehab program.    Patient transferred to CIR on 05/09/2024 .    Patient currently requires total with basic self-care skills secondary to muscle weakness, decreased cardiorespiratoy endurance, abnormal tone, unbalanced muscle activation, and motor apraxia, decreased visual perceptual skills, decreased visual motor skills, and field cut, left side neglect, decreased awareness, decreased problem solving, and decreased safety awareness, and decreased sitting balance, decreased standing balance, decreased postural control, hemiplegia, and decreased balance strategies.  Prior to hospitalization, patient was fully independent.   Patient will benefit from skilled intervention to increase independence with basic self-care skills prior to discharge home with care partner.  Anticipate patient will require 24 hour supervision and minimal physical assistance and follow up home health.  OT - End of Session Activity Tolerance: Tolerates 10 - 20 min activity with multiple rests Endurance Deficit: Yes Endurance Deficit Description: pt closing eyes often OT Assessment Rehab Potential (ACUTE ONLY): Good OT Patient demonstrates impairments in the following area(s): Balance;Cognition;Endurance;Motor;Perception;Vision;Sensory;Safety OT Basic ADL's Functional Problem(s): Bathing;Dressing;Toileting;Grooming;Eating OT Advanced ADL's  Functional Problem(s): None OT Transfers Functional Problem(s): Toilet;Tub/Shower OT Additional Impairment(s): Fuctional Use of Upper Extremity OT Plan OT Intensity: Minimum of 1-2 x/day, 45 to 90 minutes OT Frequency: 5 out of 7 days OT Duration/Estimated Length of Stay: 21-24 days OT Treatment/Interventions: Balance/vestibular training;Cognitive remediation/compensation;Discharge planning;DME/adaptive equipment instruction;Functional mobility training;Functional electrical stimulation;Patient/family education;Psychosocial support;Splinting/orthotics;Self Care/advanced ADL retraining;Therapeutic Activities;Therapeutic Exercise;UE/LE Strength taining/ROM;UE/LE Coordination activities;Visual/perceptual remediation/compensation;Neuromuscular re-education OT Self Feeding Anticipated Outcome(s): independent OT Basic Self-Care Anticipated Outcome(s): Min A OT Toileting Anticipated Outcome(s): Min A OT Bathroom Transfers Anticipated Outcome(s): Min A OT Recommendation Recommendations for Other Services: None Patient destination: Home Follow Up Recommendations: Home health OT Equipment Recommended: Tub/shower bench;3 in 1 bedside comode   OT Evaluation Precautions/Restrictions  Precautions Precautions: Fall Recall of Precautions/Restrictions: Impaired Restrictions Weight Bearing Restrictions Per Provider Order: No   Pain No c/o pain during OT session  Home Living/Prior Functioning Home Living Living Arrangements: Children, Other relatives Available Help at Discharge: Family, Available 24 hours/day Type of Home: House Home Access: Stairs to enter [employer] of Steps: 3 Entrance Stairs-Rails: None Home Layout: One level Bathroom Shower/Tub: Engineer, manufacturing systems: Standard  Lives With: Daughter Prior Function Level of Independence: Independent with basic ADLs, Independent with homemaking with ambulation, Independent with gait, Independent with transfers  Able  to Take Stairs?: Yes Driving: Yes Vocation: Full time employment Vision Baseline Vision/History: 1 Wears glasses Ability to See in Adequate Light: 2 Moderately impaired Patient Visual Report: No change from baseline Vision Assessment?: Wears glasses for reading;Yes  Eye Alignment: Within Functional Limits Ocular Range of Motion: Within Functional Limits Alignment/Gaze Preference: Gaze right Tracking/Visual Pursuits: Able to track stimulus in all quads without difficulty Visual Fields: Other (comment) (L visual field deficit based on observation, with confrontation testing pt saw target in left visual field) Perception  Perception: Impaired Praxis Praxis: Impaired Praxis Impairment Details: Motor planning Cognition Cognition Overall Cognitive Status: Impaired/Different from baseline Arousal/Alertness: Awake/alert Orientation Level: Person;Place;Situation Person: Oriented Place: Oriented Situation: Oriented Memory: Appears intact Attention: Focused;Sustained Focused Attention: Appears intact Sustained Attention: Impaired Sustained Attention Impairment: Verbal basic;Functional basic Awareness: Impaired Awareness Impairment: Emergent impairment;Anticipatory impairment Problem Solving: Impaired Problem Solving Impairment: Functional basic Executive Function: Initiating Initiating: Impaired Initiating Impairment: Verbal basic Safety/Judgment: Impaired Brief Interview for Mental Status (BIMS) Repetition of Three Words (First Attempt): 3 Temporal Orientation: Year: Correct Temporal Orientation: Month: Accurate within 5 days Temporal Orientation: Day: Correct Recall: Sock: Yes, no cue required Recall: Blue: Yes, no cue required Recall: Bed: Yes, no cue required BIMS Summary Score: 15 Sensation Sensation Light Touch: Impaired Detail Central sensation comments: able to feel touch on LUE but has impaired localization Light Touch Impaired Details: Impaired LUE;Impaired  LLE Proprioception: Impaired by gross assessment Coordination Gross Motor Movements are Fluid and Coordinated: No Fine Motor Movements are Fluid and Coordinated: No Coordination and Movement Description: dense L hemiplegia with increased tone in LLE Finger Nose Finger Test: fair on R , unable to with L Motor  Motor Motor: Hemiplegia;Abnormal tone;Abnormal postural alignment and control  Trunk/Postural Assessment  Postural Control Postural Control: Deficits on evaluation Trunk Control: strong left lean due to pushing from R leg in sitting unsupported and when perched on stedy Righting Reactions: severely impaired Protective Responses: severely impaired  Balance Static Sitting Balance Static Sitting - Level of Assistance: 2: Max assist Dynamic Sitting Balance Dynamic Sitting - Level of Assistance: 1: +1 Total assist Static Standing Balance Static Standing - Level of Assistance: Not tested (comment) (attempted stand in stedy lift, not a safe maneuver at this time) Dynamic Standing Balance Dynamic Standing - Level of Assistance: Not tested (comment) Extremity/Trunk Assessment RUE Assessment RUE Assessment: Within Functional Limits LUE Assessment LUE Assessment: Exceptions to [facility] Passive Range of Motion (PROM) Comments: WFL Active Range of Motion (AROM) Comments: 0 General Strength Comments: 0 LUE Body System: Neuro Brunstrum levels for arm and hand: Arm;Hand Brunstrum level for arm: Stage I Presynergy Brunstrum level for hand: Stage I Flaccidity LUE Tone LUE Tone: Flaccid  Care Tool Care Tool Self Care Eating   Eating Assist Level: Minimal Assistance - Patient > 75%    Oral Care    Oral Care Assist Level: Moderate Assistance - Patient 50 - 74%    Bathing   Body parts bathed by patient: Chest;Abdomen;Front perineal area;Face;Right upper leg Body parts bathed by helper: Right arm;Left arm;Buttocks;Right lower leg;Left lower leg;Left upper leg   Assist Level: Maximal  Assistance - Patient 24 - 49%    Upper Body Dressing(including orthotics)   What is the patient wearing?: Pull over shirt   Assist Level: Maximal Assistance - Patient 25 - 49%    Lower Body Dressing (excluding footwear)   What is the patient wearing?: Incontinence brief;Pants Assist for lower body dressing: 2 Helpers    Putting on/Taking off footwear   What is the patient wearing?: Non-skid slipper socks Assist for footwear: Dependent - Patient 0%       Care Tool Toileting Toileting activity   Assist for toileting: 2 Helpers     Care  Tool Bed Mobility Roll left and right activity   Roll left and right assist level: Total Assistance - Patient < 25%    Sit to lying activity   Sit to lying assist level: Total Assistance - Patient < 25%    Lying to sitting on side of bed activity   Lying to sitting on side of bed assist level: the ability to move from lying on the back to sitting on the side of the bed with no back support.: Total Assistance - Patient < 25%     Care Tool Transfers Sit to stand transfer   Sit to stand assist level: 2 Helpers    Chair/bed transfer   Chair/bed transfer assist level: 2 Chief Strategy Officer transfer activity did not occur: Safety/medical concerns (pt has very impaired sitting balance and pushes to her L)       Care Tool Cognition  Expression of Ideas and Wants Expression of Ideas and Wants: 4. Without difficulty (complex and basic) - expresses complex messages without difficulty and with speech that is clear and easy to understand  Understanding Verbal and Non-Verbal Content Understanding Verbal and Non-Verbal Content: 3. Usually understands - understands most conversations, but misses some part/intent of message. Requires cues at times to understand   Memory/Recall Ability Memory/Recall Ability : That he or she is in a hospital/hospital unit;Current season   Refer to Care Plan for Long Term Goals  SHORT TERM GOAL WEEK 1 OT  Short Term Goal 1 (Week 1): Pt will maintain static sit EOB with CGA to supervision to engage in UB self care safely. OT Short Term Goal 2 (Week 1): Pt will complete squat pivot transfers from bed >< w/c with mod A or less with 1 person to work towards a safe toilet transfers. OT Short Term Goal 3 (Week 1): Pt will demonstrate improved L arm awareness to attend to arm during bed mobility tasks with min cues. OT Short Term Goal 4 (Week 1): Pt will be able to don pull over shirt with mod A.  Recommendations for other services: None    Skilled Therapeutic Intervention ADL ADL Eating: Minimal assistance Grooming: Minimal assistance Upper Body Bathing: Moderate assistance Lower Body Bathing: Maximal assistance Upper Body Dressing: Maximal assistance Lower Body Dressing: Dependent Toileting: Dependent Toilet Transfer: Not assessed (attempted a toilet transfer with stedy, not safe at this time due to strong L lean and push to the L) Mobility  Bed Mobility Bed Mobility: Rolling Right;Rolling Left;Supine to Sit;Sit to Supine Rolling Right: Dependent - Patient equal 0% Rolling Left: Maximal Assistance - Patient 25-49% Supine to Sit: 2 Helpers Sit to Supine: Dependent - Patient equal 0%   Discharge Criteria: Patient will be discharged from OT if patient refuses treatment 3 consecutive times without medical reason, if treatment goals not met, if there is a change in medical status, if patient makes no progress towards goals or if patient is discharged from hospital.  The above assessment, treatment plan, treatment alternatives and goals were discussed and mutually agreed upon: by patient  [facility] 05/10/2024, 12:38 PM

## 2024-05-10 NOTE — Evaluation (Signed)
 Speech Language Pathology Assessment and Plan  Patient Details  Name: [name] MRN: [id number] Date of Birth: [birth year]  SLP Diagnosis: Cognitive Impairments;Dysphagia  Rehab Potential: Good ELOS: 21-24 days  Today's Date: 05/10/2024 SLP Individual Time: 1002-1100 SLP Individual Time Calculation (min): 58 min  Hospital Problem: Principal Problem:   Acute right ACA stroke (HCC)  Past Medical History:  Past Medical History:  Diagnosis Date   Clotting disorder (HCC)    on xarelto    Essential hypertension    Heart murmur    a. 10/2015 Echo: EF 60-65%, no rwma, mild AI/MR, sev dil LA/RA, PASP .   Noncompliance    NSVT (nonsustained ventricular tachycardia) (HCC)    a. 10/2015 during admission for CVA/AF.   Persistent atrial fibrillation (HCC)    a. CHA2DS2VASC = 4-->Xarelto ;  b. 02/2016 Successful DCCV.  c. Recurrent fib   Pneumonia 2012 x 2; 2015   Stroke [facility])    a. 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   Transient ischemic attack (TIA) 01/2013   Past Surgical History:  Past Surgical History:  Procedure Laterality Date   CARDIOVERSION N/A 02/18/2013   Procedure: CARDIOVERSION;  Surgeon: Jerel Balding, MD;  Location: [facility] ENDOSCOPY;  Service: Cardiovascular;  Laterality: N/A;   CARDIOVERSION N/A 02/23/2013   Procedure: CARDIOVERSION;  Surgeon: Alm LELON Clay, MD;  Location: [facility] OR;  Service: Cardiovascular;  Laterality: N/A;  BESIDE CV   CARDIOVERSION N/A 03/12/2016   Procedure: CARDIOVERSION;  Surgeon: Ezra GORMAN Shuck, MD;  Location: [facility] ENDOSCOPY;  Service: Cardiovascular;  Laterality: N/A;   RADIOLOGY WITH ANESTHESIA N/A 05/05/2024   Procedure: RADIOLOGY WITH ANESTHESIA;  Surgeon: Radiologist, Medication, MD;  Location: [facility] OR;  Service: Radiology;  Laterality: N/A;   RIGHT/LEFT HEART CATH AND CORONARY ANGIOGRAPHY N/A 04/02/2021   Procedure: RIGHT/LEFT HEART CATH AND CORONARY ANGIOGRAPHY;   Surgeon: Claudene Victory LELON, MD;  Location: [facility] INVASIVE CV LAB;  Service: Cardiovascular;  Laterality: N/A;   TEE WITHOUT CARDIOVERSION N/A 02/18/2013   Procedure: TRANSESOPHAGEAL ECHOCARDIOGRAM (TEE);  Surgeon: Jerel Balding, MD;  Location: [facility] ENDOSCOPY;  Service: Cardiovascular;  Laterality: N/A;   TUBAL LIGATION  1992    Assessment / Plan / Recommendation Clinical Impression  [name] is a 58 year old female with history of HTN, Type 2 DM, R posterior MCA stroke (2019), R temporoparietal lobe with upper M2 cutoff 2017, and A. Fib on Xarelto  presented to [facility] by EMS as a Code Stroke on 05/05/2024 after acute onset of left-sided weakness while at work. Initial CT of the head was negative, but CTA of the head and neck showed an abrupt occlusion of the right M1 segment plaque with proximal right ICA stenosis, which had progressed since the last CTA in 2019. Neurology was consulted, and she underwent mechanical thrombectomy with TICI 2B revascularization performed by Dr. Rosslyn. The etiology was determined to be embolic, related to her history of atrial fibrillation (AFib) and noncompliance while on Xarelto . An MRI revealed acute right MCA territory infarcts with particular hemorrhage. The patient experienced dysphagia and was evaluated by speech therapy, leading to a recommendation for a dysphagia 2 diet with thin liquids. Prior to this event, she was independent with activities of daily living (ADLs), worked in [employer], drove, and lived in a one-level home with her daughter. Currently, she requires moderate assistance with mobility and basic ADLs. Cardiology has adjusted her medications in order to simplify her regimen.  The patient is currently Mod A with mobility and basic ADLs. Therapy evaluations completed due to patient decreased functional mobility was admitted for a comprehensive rehab program. Pt was admitted to CIR on 05/09/24.   Swallowing: Pt presents with a suspected  mild oropharyngeal dysphagia. Oral mechanism exam revealed natural dentition and remarkable for L side facial droop. Otherwise WNL. Pt upright in bed for session though noted to be quite fatigued and requiring additional time before achieving full alertness. In response to thin liquids presented via straw pt noted with cough after swallow of consecutive sips in 1/2 opportunities. She was also observed with anterior spillage of liquids to left side. She was noted with mild bolus holding in response to puree trials. Dys 2 trials were largely unremarkable. In response to Dys 3 trials pt with reduced facial sensation in response to anterior spillage of trials. Pt also with bolus holding and min lingual residue after swallow which cleared with a liquid wash. Suspect pt performance may have been better if she wasn't so fatigued. Her daughter who was at bedside reports consistently improving oral phase and no coughing. SLP recommending continuation of Dys 2 solids and thin liquids with meds whole in puree.   Cognitive/ Linguistic: Patient's overall cognitive functioning was not formally assessed as greater time spent on swallow function and arousing pt for full participation. She was oriented to time, space, and situation. She was able to communicate participation in an earlier therapy session by recalling some specific tasks completed. Suspect some cognitive deficits present post CVA given location of infarct along with inconsistencies observed in STM and retrieval. Pt also demonstrated some reduced insight and awareness into physical deficits and recovery expectations. Mild L neglect noted with pt able to move past midline given verbal cues.  Dtr at bedside reports pt's cognition has improved throughout hospitalization and her LTM appears to be intact. SLP recommending further formal cognitive assessment to assess potential needs.   Pt would benefit from skilled SLP services to maximize dysphagia and cognition in  order to maximize her independence prior to discharge. Anticipate pt will require supervision at home and possible f/u SLP services.     Skilled Therapeutic Interventions          BSE and  informal assessment measures administered. Please see full report for additional details.     SLP Assessment  Patient will need skilled Speech Lanaguage Pathology Services during CIR admission    Recommendations  SLP Diet Recommendations: Dysphagia 2 (Fine chop);Thin Liquid Administration via: Straw Medication Administration: Whole meds with puree Supervision: Patient able to self feed;Intermittent supervision to cue for compensatory strategies Compensations: Slow rate;Small sips/bites;Minimize environmental distractions;Lingual sweep for clearance of pocketing Postural Changes and/or Swallow Maneuvers: Seated upright 90 degrees Oral Care Recommendations: Oral care BID Recommendations for Other Services: Neuropsych consult Patient destination: Home Follow up Recommendations: None Equipment Recommended: None recommended by SLP    SLP Frequency 3 to 5 out of 7 days   SLP Duration  SLP Intensity  SLP Treatment/Interventions 21-24  Minumum of 1-2 x/day, 30 to 90 minutes  Cognitive remediation/compensation;Speech/Language facilitation;Functional tasks;Therapeutic Activities;Internal/external aids;Therapeutic Exercise;Dysphagia/aspiration precaution training;Patient/family education    Pain Pain Assessment Pain Scale: 0-10 Pain Score: 0-No pain  Prior Functioning Type of Home: House  Lives With: Daughter Available Help at Discharge: Family;Available 24 hours/day  SLP Evaluation Cognition Overall Cognitive Status: Impaired/Different from baseline Arousal/Alertness: Lethargic Orientation Level: Oriented to person;Oriented to place;Oriented to time;Oriented to situation Year: 2025 Month: August Day of Week: Correct  Attention: Focused;Sustained Focused Attention: Appears intact Sustained  Attention: Impaired Sustained Attention Impairment: Verbal basic;Functional basic Memory: Impaired Memory Impairment: Decreased recall of new information Awareness: Impaired Awareness Impairment: Emergent impairment;Anticipatory impairment Problem Solving: Impaired Problem Solving Impairment: Functional basic Executive Function: Initiating Initiating: Impaired Initiating Impairment: Verbal basic Safety/Judgment: Impaired  Comprehension Auditory Comprehension Overall Auditory Comprehension: Appears within functional limits for tasks assessed Expression Expression Primary Mode of Expression: Verbal Verbal Expression Overall Verbal Expression: Impaired Pragmatics: Impairment Impairments: Abnormal affect;Eye contact Written Expression Dominant Hand: Right Oral Motor Oral Motor/Sensory Function Overall Oral Motor/Sensory Function: Mild impairment Facial ROM: Reduced left;Suspected CN VII (facial) dysfunction Facial Symmetry: Abnormal symmetry left;Suspected CN VII (facial) dysfunction Facial Strength: Reduced left;Suspected CN VII (facial) dysfunction Facial Sensation: Within Functional Limits Lingual ROM: Within Functional Limits Lingual Symmetry: Within Functional Limits Lingual Strength: Within Functional Limits Lingual Sensation: Within Functional Limits Velum: Within Functional Limits Mandible: Within Functional Limits Motor Speech Overall Motor Speech: Appears within functional limits for tasks assessed (flat affect)  Care Tool Care Tool Cognition Ability to hear (with hearing aid or hearing appliances if normally used Ability to hear (with hearing aid or hearing appliances if normally used): 0. Adequate - no difficulty in normal conservation, social interaction, listening to TV   Expression of Ideas and Wants Expression of Ideas and Wants: 4. Without difficulty (complex and basic) - expresses complex messages without difficulty and with speech that is clear and easy to  understand   Understanding Verbal and Non-Verbal Content Understanding Verbal and Non-Verbal Content: 3. Usually understands - understands most conversations, but misses some part/intent of message. Requires cues at times to understand  Memory/Recall Ability Memory/Recall Ability : That he or she is in a hospital/hospital unit;Current season   Bedside Swallowing Assessment General Diet Prior to this Study: Dysphagia 2 (finely chopped);Thin liquids (Level 0) Respiratory Status: Room air History of Recent Intubation: Yes Total duration of intubation (days): 1 days Date extubated: 05/05/24 Oral Cavity - Dentition: Adequate natural dentition Vision: Functional for self-feeding Baseline Vocal Quality: Normal Volitional Cough: Strong Volitional Swallow: Able to elicit  Oral Care Assessment Oral Assessment  (WDL): Exceptions to WDL Lips: Asymmetrical Teeth: Poor dental hygiene Tongue: Pink;Moist;Coated white Mucous Membrane(s): Pink;Moist Saliva: Moist, saliva free flowing Level of Consciousness: Alert Is patient on any of following O2 devices?: None of the above Nutritional status: Dysphagia Oral Assessment Risk : High Risk Ice Chips Ice chips: Not tested Oral Phase Impairments: Reduced labial seal Thin Liquid Thin Liquid: Impaired Presentation: Straw Oral Phase Impairments: Reduced labial seal Oral Phase Functional Implications: Left anterior spillage Pharyngeal  Phase Impairments: Cough - Delayed Nectar Thick Nectar Thick Liquid: Not tested Honey Thick Honey Thick Liquid: Not tested Puree Puree: Impaired Oral Phase Impairments: Reduced labial seal Oral Phase Functional Implications: Prolonged oral transit Solid Solid: Impaired Oral Phase Impairments: Poor awareness of bolus Oral Phase Functional Implications: Left anterior spillage;Prolonged oral transit BSE Assessment Risk for Aspiration Impact on safety and function: Mild aspiration risk Other Related Risk Factors:  Previous CVA;Lethargy  Short Term Goals: Week 1: SLP Short Term Goal 1 (Week 1): Pt will trial Dys 3 textures with SLP only demonstrating timely and functional oral phase of swallow provided min-mod A cues SLP Short Term Goal 2 (Week 1): Patient will utilize swallowing compensatory strategies to reduce s/sx of aspiration during consumption of PO given min multimodal A SLP Short Term Goal 3 (Week 1): Patient will attend to L side during functional tasks given mod multimodal A  Patient  will attend to the left field during functional problem solving tasks in 4/5 opportunities given max multimodal cues. SLP Short Term Goal 4 (Week 1): Patient will participate in a formal cognitive assessment in order to determine potential cognitive intervention  Refer to Care Plan for Long Term Goals  Recommendations for other services: Neuropsych  Discharge Criteria: Patient will be discharged from SLP if patient refuses treatment 3 consecutive times without medical reason, if treatment goals not met, if there is a change in medical status, if patient makes no progress towards goals or if patient is discharged from hospital.  The above assessment, treatment plan, treatment alternatives and goals were discussed and mutually agreed upon: by patient and by family  [name] 05/10/2024, 4:31 PM

## 2024-05-10 NOTE — Progress Notes (Signed)
 PROGRESS NOTE   Subjective/Complaints: Pt reports an uneventful night. No pain.   ROS: Limited due to cognitive/behavioral    Objective:   No results found. Recent Labs    05/08/24 0344 05/10/24 0447  WBC 11.9* 12.1*  HGB 11.4* 13.3  HCT 35.4* 41.7  PLT 153 206   Recent Labs    05/09/24 1916 05/10/24 0447  NA 136 137  K 4.0 3.8  CL 104 104  CO2 23 23  GLUCOSE 110* 102*  BUN 11 11  CREATININE 0.91 0.97  CALCIUM  9.3 9.4   No intake or output data in the 24 hours ending 05/10/24 0950      Physical Exam: Vital Signs Blood pressure (!) 152/99, pulse 97, temperature 98.1 F (36.7 C), resp. rate 19, height 5' 3.5 (1.613 m), weight 88.6 kg, SpO2 100%.  General: Alert and oriented x 3, No apparent distress HEENT: Head is normocephalic, atraumatic, PERRLA, EOMI, sclera anicteric, oral mucosa pink and moist, dentition intact, ext ear canals clear,  Neck: Supple without JVD or lymphadenopathy Heart: Reg rate and rhythm. No murmurs rubs or gallops Chest: CTA bilaterally without wheezes, rales, or rhonchi; no distress Abdomen: Soft, non-tender, non-distended, bowel sounds positive. Extremities: No clubbing, cyanosis, or edema. Pulses are 2+ Psych: Pt's affect is appropriate. Pt is cooperative Skin: Clean and intact without signs of breakdown Neuro:  pt is alert. Right gaze preference. Follows basic commands. Soft voice, speech generally clear only sl dysarthria, left central VII. MMT:LUE 0/5. ?perhaps trace HE LLE but otherwise 0/5. Moves right side freely. Senses pain and light touch in all 4's. LUE DTR's 1+. LLE DTR's brisk 3+ with early extensor tone, toes up Musculoskeletal: no pain with attempts at AROM/PROM    Assessment/Plan: 1. Functional deficits which require 3+ hours per day of interdisciplinary therapy in a comprehensive inpatient rehab setting. Physiatrist is providing close team supervision and 24 hour  management of active medical problems listed below. Physiatrist and rehab team continue to assess barriers to discharge/monitor patient progress toward functional and medical goals  Care Tool:  Bathing              Bathing assist       Upper Body Dressing/Undressing Upper body dressing        Upper body assist      Lower Body Dressing/Undressing Lower body dressing            Lower body assist       Toileting Toileting    Toileting assist       Transfers Chair/bed transfer  Transfers assist           Locomotion Ambulation   Ambulation assist              Walk 10 feet activity   Assist           Walk 50 feet activity   Assist           Walk 150 feet activity   Assist           Walk 10 feet on uneven surface  activity   Assist           Wheelchair  Assist               Wheelchair 50 feet with 2 turns activity    Assist            Wheelchair 150 feet activity     Assist          Blood pressure (!) 152/99, pulse 97, temperature 98.1 F (36.7 C), resp. rate 19, height 5' 3.5 (1.613 m), weight 88.6 kg, SpO2 100%.  Medical Problem List and Plan: 1. 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 Pt is s/p IR w TICI2b. CVA likely cardioembolic             -patient may  shower             -ELOS/Goals: 14-20 days, Min A PT/OT, Sup with SLP              -Patient is beginning CIR therapies today including PT, OT, and SLP   -PRAFO and WHO to help maintain hand and foot positioning 2.  Antithrombotics: -DVT/anticoagulation:  Pharmaceutical: Xarelto              -antiplatelet therapy: N/A 3. Pain Management: Tylenol  prn for pain              --Has been on topamax  for HA in the past.   -denied pain today 4. Mood/Behavior/Sleep: LCSW to follow for evaluaton and support.              --trazodone  prn for insomnia.              -antipsychotic agents: N/A 5.  Neuropsych/cognition: This patient is capable of making decisions on her own behalf. 6. Skin/Wound Care: Routine pressure relief measures.  7. Fluids/Electrolytes/Nutrition: Monitor I/O. Add CM restrictions to diet             -Post stroke Dysphagia --SLP following            -encourage PO, protein supp for low albumin  8. A fib: Monitor HR TID-- Metoprolol  succinate 200 mg daily  --Continue Xarelto .  9. HTN: Monitor BP TID. On metoprolol  and aldactone   -bp borderline currently--obsv today 10. NICM w/HFrEF: Discussed with Cardiology Dr. Maceo. Digoxin  and metoprolol  tartrate was stooped. Entresto  not continued. She is continued on spironolactone  and Metoprolol  succinate 200mg  daily in the AM.  11.  Pre-diabetes: On Farxiga  and should help w/BS. Added CM restrictions to diet.  --Monitor BS ac/hs and use SSI tor elevated BS.  CBG (last 3)  Recent Labs    05/09/24 1703 05/09/24 2109 05/10/24 0648  GLUCAP 121* 103* 116*   8/26 appears well controlled  12. HLD- LDL 109 crestor  40 mg daily  13. Obesity:  BMI 34.84 educate on diet and weight loss to promote overall health and mobility.  14. Leukocytosis: no s/s infection WBC 14.2--13.9--11.9   -wbc holding around 12k  15. Slow trans constipation:   -miralax  scheduled with prns  -last bm 8/24  LOS: 1 days A FACE TO FACE EVALUATION WAS PERFORMED  [name] 05/10/2024, 9:50 AM

## 2024-05-10 NOTE — Plan of Care (Signed)
  Problem: RH Balance Goal: LTG Patient will maintain dynamic standing with ADLs (OT) Description: LTG:  Patient will maintain dynamic standing balance with assist during activities of daily living (OT)  Flowsheets (Taken 05/10/2024 1248) LTG: Pt will maintain dynamic standing balance during ADLs with: Minimal Assistance - Patient > 75%   Problem: Sit to Stand Goal: LTG:  Patient will perform sit to stand in prep for activites of daily living with assistance level (OT) Description: LTG:  Patient will perform sit to stand in prep for activites of daily living with assistance level (OT) Flowsheets (Taken 05/10/2024 1248) LTG: PT will perform sit to stand in prep for activites of daily living with assistance level: Contact Guard/Touching assist   Problem: RH Eating Goal: LTG Patient will perform eating w/assist, cues/equip (OT) Description: LTG: Patient will perform eating with assist, with/without cues using equipment (OT) Flowsheets (Taken 05/10/2024 1248) LTG: Pt will perform eating with assistance level of: Independent   Problem: RH Grooming Goal: LTG Patient will perform grooming w/assist,cues/equip (OT) Description: LTG: Patient will perform grooming with assist, with/without cues using equipment (OT) Flowsheets (Taken 05/10/2024 1248) LTG: Pt will perform grooming with assistance level of: Independent   Problem: RH Bathing Goal: LTG Patient will bathe all body parts with assist levels (OT) Description: LTG: Patient will bathe all body parts with assist levels (OT) Flowsheets (Taken 05/10/2024 1248) LTG: Pt will perform bathing with assistance level/cueing: Minimal Assistance - Patient > 75%   Problem: RH Dressing Goal: LTG Patient will perform upper body dressing (OT) Description: LTG Patient will perform upper body dressing with assist, with/without cues (OT). Flowsheets (Taken 05/10/2024 1248) LTG: Pt will perform upper body dressing with assistance level of: Contact Guard/Touching  assist Goal: LTG Patient will perform lower body dressing w/assist (OT) Description: LTG: Patient will perform lower body dressing with assist, with/without cues in positioning using equipment (OT) Flowsheets (Taken 05/10/2024 1248) LTG: Pt will perform lower body dressing with assistance level of: Minimal Assistance - Patient > 75%   Problem: RH Toileting Goal: LTG Patient will perform toileting task (3/3 steps) with assistance level (OT) Description: LTG: Patient will perform toileting task (3/3 steps) with assistance level (OT)  Flowsheets (Taken 05/10/2024 1248) LTG: Pt will perform toileting task (3/3 steps) with assistance level: Minimal Assistance - Patient > 75%   Problem: RH Functional Use of Upper Extremity Goal: LTG Patient will use RT/LT upper extremity as a (OT) Description: LTG: Patient will use right/left upper extremity as a stabilizer/gross assist/diminished/nondominant/dominant level with assist, with/without cues during functional activity (OT) Flowsheets (Taken 05/10/2024 1248) LTG: Use of upper extremity in functional activities: LUE as a stabilizer LTG: Pt will use upper extremity in functional activity with assistance level of: Supervision/Verbal cueing   Problem: RH Toilet Transfers Goal: LTG Patient will perform toilet transfers w/assist (OT) Description: LTG: Patient will perform toilet transfers with assist, with/without cues using equipment (OT) Flowsheets (Taken 05/10/2024 1248) LTG: Pt will perform toilet transfers with assistance level of: Minimal Assistance - Patient > 75%   Problem: RH Tub/Shower Transfers Goal: LTG Patient will perform tub/shower transfers w/assist (OT) Description: LTG: Patient will perform tub/shower transfers with assist, with/without cues using equipment (OT) Flowsheets (Taken 05/10/2024 1248) LTG: Pt will perform tub/shower stall transfers with assistance level of: Minimal Assistance - Patient > 75% LTG: Pt will perform tub/shower  transfers from: Tub/shower combination

## 2024-05-10 NOTE — Progress Notes (Signed)
  Progress Note  Patient Name: [name] Date of Encounter: 05/10/2024 [facility] Cardiologist: Jerel Balding, MD   Interval Summary   More interactive and cheerful today. Left hemiplegia. Good ventricular rate control and acceptable BP this AM.  Vital Signs Vitals:   05/09/24 1701 05/09/24 1935 05/10/24 0648  BP: 120/74 (!) 140/57 (!) 152/99  Pulse: 91 60 97  Resp: 16 17 19   Temp: 98 F (36.7 C) 98.2 F (36.8 C) 98.1 F (36.7 C)  TempSrc: Oral    SpO2: 100% 100%   Weight: 88.6 kg    Height: 5' 3.5 (1.613 m)     No intake or output data in the 24 hours ending 05/10/24 0956    05/09/2024    5:01 PM 05/05/2024   11:11 AM 05/05/2024   11:00 AM  Last 3 Weights  Weight (lbs) 195 lb 5.2 oz 203 lb 203 lb 11.3 oz  Weight (kg) 88.6 kg 92.08 kg 92.4 kg      Telemetry/ECG  N/a - Personally Reviewed  Physical Exam  GEN: No acute distress.   Neck: No JVD Cardiac: irregular, no murmurs, rubs, or gallops.  Respiratory: Clear to auscultation bilaterally. 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 As much as we can, keep the regimen simple and affordable, with once daily medications, to improve compliance. Will keep an eye on her hemodynamics this week   For questions or updates, please contact [facility] Please consult [email or web address] for contact info under       Signed, Jerel Balding, MD

## 2024-05-11 DIAGNOSIS — I63521 Cerebral infarction due to unspecified occlusion or stenosis of right anterior cerebral artery: Secondary | ICD-10-CM | POA: Diagnosis not present

## 2024-05-11 DIAGNOSIS — E139 Other specified diabetes mellitus without complications: Secondary | ICD-10-CM

## 2024-05-11 DIAGNOSIS — I69391 Dysphagia following cerebral infarction: Secondary | ICD-10-CM

## 2024-05-11 DIAGNOSIS — I10 Essential (primary) hypertension: Secondary | ICD-10-CM | POA: Diagnosis not present

## 2024-05-11 LAB — GLUCOSE, CAPILLARY
Glucose-Capillary: 108 mg/dL — ABNORMAL HIGH (ref 70–99)
Glucose-Capillary: 100 mg/dL — ABNORMAL HIGH (ref 70–99)
Glucose-Capillary: 98 mg/dL (ref 70–99)
Glucose-Capillary: 107 mg/dL — ABNORMAL HIGH (ref 70–99)

## 2024-05-11 NOTE — Progress Notes (Signed)
 Physical Therapy Session Note  Patient Details  Name: [name] MRN: [id number] Date of Birth: 13-Jul-1966  Today's Date: 05/11/2024 PT Individual Time: 9194-9152; 1352 - 1455 PT Individual Time Calculation (min): 42 min; 63 min Today's Date: 05/11/2024 PT Missed Time: 12 Minutes Missed Time Reason: Patient fatigue   Short Term Goals: Week 1:  PT Short Term Goal 1 (Week 1): Pt will transfer sup to sit w/ max A x 1. PT Short Term Goal 2 (Week 1): Pt will transfer bed <> w/c w/ max A x 1 consistently, SB or SPT. PT Short Term Goal 3 (Week 1): PT to assess gait. PT Short Term Goal 4 (Week 1): Pt to maintain seated balance w/o trunk support x 10' w/ min A.  SESSION 1 Skilled Therapeutic Interventions/Progress Updates: Patient supine in bed (HOB elevated) with daughter present on entrance to room. Patient alert and agreeable to PT session.   Patient reported no pain during session. Beginning of session focused on building pt and pt daughter rapport with pt acknowledging some of current deficits regarding functional mobility and decreased L sided awareness. Pt heavy maxA to donn personal pants supine in bed with maxA to roll to R/L from supine to pull pants up to waist with added cuing to use HOB rolling. Pt assisted as able with R UE when cued. Pt performed supine<sit EOB with maxA and cues to push into railing with R UE, and to reach for FOB railing to further encourage upright truncal support. Pt sitting EOB with intermediate minA to prevent fall to L while PTA donned personal shoes. Pt performed one sit<>stand with maxA and VC for sequence while + 2 fully donned pants around waist. Pt then performed squat pivot transfer to R with VC for set up and performed with maxA. PTA adjusted B leg rests to increase comfort/tolerance to sitting in TIS OOB between therapy sessions.   Patient sitting in TIS at end of session with brakes locked, daughter present, belt alarm set, and all needs within  reach.  SESSION 2 Skilled Therapeutic Interventions/Progress Updates: Patient supine in bed on entrance to room. Patient alert and agreeable to PT session.   Patient reported no pain, only fatigue but willing to do all that she can during session.   Therapeutic Activity: Bed Mobility: Pt performed supine<>sit on EOB with maxA (to sit on R side of bed in order for pt to use R UE to assist with truncal elevation). VC required for sequence (modA to roll from supine to R sidelying with use of HOB railing). Rehab tech present to assist at beginning of session to further assess pt's ability to perform wit nsg. 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 Pt totalA to scoot to [facility] at end of session + 2 via chuck Transfers: Pt performed sit<>stand transfer to STEDY maxA + 2 with cues for sequence (will push with R UE on bar) and maxA + 2 squat pivot transfer from TIS<>mat/EOB with multimodal cuing for head/hip relationship and hand/LE placement. Pt required multimodal cuing to scoot anteriorly/posteriorly, and maxA to perform scoot on TIS/edge of mat/EOB.   Neuromuscular Re-ed: NMR facilitated during session with focus on proprioceptive feedback, weight shift, attention to task, memory recall, midline orientation. - Pt sitting edge of mat performing mass practice of wedge underneath R hip to further augment pt's error of L push/lean. Pt cued  throughout to reach to tech's or pt's daughter hand with noted improvement in reaching to R when pt noticing increase in L lean. Pt also with mirror feedback throughout. Pt also with towel underneath R foot to decrease use of R LE to push to L. Pt required rest breaks throughout. Pt then performed same task without wedge and noted improvement in sitting posture to midline (still lean to L, but not as excessive). Pt also cued to keep R UE on lap vs on edge of mat  pushing or pulling self to center (max cuing required as pt would almost immediately place UE back on mat). Pt also performed multidirectional reaching to R and L and intermediate assistance of CGA-modA throughout. - Pt sitting in STEDY with notable increase in R LE in extension with tactile feedback on knee flexors to promote knee flexion vs extension. Pt performed same reach to R to + 2 hand (increase in pt's push with R UE as well when on bar, so pt cued throughout to either keep UE on thighs, or to reach to R when noticing increase in L lean). Pt also performed static stance with max/totalA to maintain L knee in extension and to prevent L LOB. Pt reported fatigue and to return to TIS to rest. Pt transported back to room after transfer to rest.   NMR performed for improvements in motor control and coordination, balance, sequencing, judgement, and self confidence/ efficacy in performing all aspects of mobility at highest level of independence.   Patient supine in bed at end of session with brakes locked, daughter present, and all needs within reach.       Therapy Documentation Precautions:  Precautions Precautions: Fall Recall of Precautions/Restrictions: Impaired Restrictions Weight Bearing Restrictions Per Provider Order: No  Therapy/Group: Individual Therapy  Dontravious Camille PTA 05/11/2024, 3:21 PM

## 2024-05-11 NOTE — Progress Notes (Signed)
 PROGRESS NOTE   Subjective/Complaints: Pt feeling fairly well up at EOB about to work with OT when I came by. Daughter in room. Didn't have any specific complaints  ROS: Patient denies fever, rash, sore throat, blurred vision, dizziness, nausea, vomiting, diarrhea, cough, shortness of breath or chest pain, joint or back/neck pain, headache, or mood change.    Objective:   No results found. Recent Labs    05/10/24 0447  WBC 12.1*  HGB 13.3  HCT 41.7  PLT 206   Recent Labs    05/09/24 1916 05/10/24 0447  NA 136 137  K 4.0 3.8  CL 104 104  CO2 23 23  GLUCOSE 110* 102*  BUN 11 11  CREATININE 0.91 0.97  CALCIUM  9.3 9.4    Intake/Output Summary (Last 24 hours) at 05/11/2024 1803 Last data filed at 05/11/2024 1338 Gross per 24 hour  Intake 600 ml  Output --  Net 600 ml        Physical Exam: Vital Signs Blood pressure 113/70, pulse 99, temperature 97.7 F (36.5 C), temperature source Oral, resp. rate 20, height 5' 3.5 (1.613 m), weight 88.6 kg, SpO2 100%.  Constitutional: No distress . Vital signs reviewed. HEENT: NCAT, EOMI, oral membranes moist Neck: supple Cardiovascular: RRR without murmur. No JVD    Respiratory/Chest: CTA Bilaterally without wheezes or rales. Normal effort    GI/Abdomen: BS +, non-tender, non-distended Ext: no clubbing, cyanosis, or edema Psych: pleasant and cooperative  Skin: Clean and intact without signs of breakdown Neuro:  pt is alert. Right gaze preference. Follows basic commands. Soft voice, speech generally clear only sl dysarthria, left central VII. MMT:LUE 0/5. ?perhaps trace HE LLE but otherwise 0/5. Moves right side freely. Senses pain and light touch in all 4's. LUE DTR's 1+. LLE DTR's brisk 3+ with early extensor tone, toes up. Fair sitting balance at eob. Need occasional cues for better posture as she leaned to left sl.  Musculoskeletal: no pain with attempts at AROM/PROM     Assessment/Plan: 1. Functional deficits which require 3+ hours per day of interdisciplinary therapy in a comprehensive inpatient rehab setting. Physiatrist is providing close team supervision and 24 hour management of active medical problems listed below. Physiatrist and rehab team continue to assess barriers to discharge/monitor patient progress toward functional and medical goals  Care Tool:  Bathing    Body parts bathed by patient: Chest, Abdomen, Front perineal area, Face, Right upper leg   Body parts bathed by helper: Right arm, Left arm, Buttocks, Right lower leg, Left lower leg, Left upper leg     Bathing assist Assist Level: Maximal Assistance - Patient 24 - 49%     Upper Body Dressing/Undressing Upper body dressing   What is the patient wearing?: Pull over shirt    Upper body assist Assist Level: Maximal Assistance - Patient 25 - 49%    Lower Body Dressing/Undressing Lower body dressing      What is the patient wearing?: Incontinence brief, Pants     Lower body assist Assist for lower body dressing: 2 Helpers     Toileting Toileting    Toileting assist Assist for toileting: 2 Helpers  Transfers Chair/bed transfer  Transfers assist  Chair/bed transfer activity did not occur: Safety/medical concerns  Chair/bed transfer assist level: 2 Helpers (SPT or SB)     Locomotion Ambulation   Ambulation assist   Ambulation activity did not occur: Safety/medical concerns          Walk 10 feet activity   Assist  Walk 10 feet activity did not occur: Safety/medical concerns        Walk 50 feet activity   Assist Walk 50 feet with 2 turns activity did not occur: Safety/medical concerns         Walk 150 feet activity   Assist Walk 150 feet activity did not occur: Safety/medical concerns         Walk 10 feet on uneven surface  activity   Assist Walk 10 feet on uneven surfaces activity did not occur: Safety/medical concerns          Wheelchair     Assist Is the patient using a wheelchair?: Yes Type of Wheelchair: Manual    Wheelchair assist level: Dependent - Patient 0% Max wheelchair distance: 150    Wheelchair 50 feet with 2 turns activity    Assist        Assist Level: Dependent - Patient 0%   Wheelchair 150 feet activity     Assist      Assist Level: Dependent - Patient 0%   Blood pressure 113/70, pulse 99, temperature 97.7 F (36.5 C), temperature source Oral, resp. rate 20, height 5' 3.5 (1.613 m), weight 88.6 kg, SpO2 100%.  Medical Problem List and Plan: 1. 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 Pt is s/p IR w TICI2b. 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 Interdisciplinary team conference today to discuss goals, barriers to discharge, and dc planning.    -PRAFO and WHO to help maintain hand and foot positioning 2.  Antithrombotics: -DVT/anticoagulation:  Pharmaceutical: Xarelto              -antiplatelet therapy: N/A 3. Pain Management: Tylenol  prn for pain              --Has been on topamax  for HA in the past.   -denied pain today 4. Mood/Behavior/Sleep: LCSW to follow for evaluaton and support.              --trazodone  prn for insomnia.              -antipsychotic agents: N/A 5. Neuropsych/cognition: This patient is capable of making decisions on her own behalf. 6. Skin/Wound Care: Routine pressure relief measures.  7. Fluids/Electrolytes/Nutrition: Monitor I/O. Add CM restrictions to diet             -Post stroke Dysphagia --SLP following. D2 diet/thins            -encourage PO, protein supp for low albumin  8. A fib: Monitor HR TID-- Metoprolol  succinate 200 mg daily  --Continue Xarelto .  9. HTN: Monitor BP TID. On metoprolol  and aldactone   -bp borderline currently--obsv today 10. NICM  w/HFrEF: Discussed with Cardiology Dr. Maceo. Digoxin  and metoprolol  tartrate was stooped. Entresto  not continued. She is continued on spironolactone  and Metoprolol  succinate 200mg  daily  in the AM.  11.  Pre-diabetes: On Farxiga  and should help w/BS. Added CM restrictions to diet.  --Monitor BS ac/hs and use SSI tor elevated BS.  CBG (last 3)  Recent Labs    05/11/24 0625 05/11/24 1223 05/11/24 1611  GLUCAP 108* 100* 98   8/27 still controlled. May be able to back off cbg checks 12. HLD- LDL 109 crestor  40 mg daily  13. Obesity:  BMI 34.84 educate on diet and weight loss to promote overall health and mobility.  14. Leukocytosis: no s/s infection WBC 14.2--13.9--11.9   -wbc holding around 12k  15. Slow trans constipation:   -miralax  scheduled with prns  -last bm 8/26  LOS: 2 days A FACE TO FACE EVALUATION WAS PERFORMED  [name] 05/11/2024, 6:03 PM

## 2024-05-11 NOTE — Progress Notes (Signed)
 Adequate blood pressure control and ventricular rate control with current medications. BP 110/78 yesterday afternoon, slightly higher this morning at 144/88.  Heart rate in the 60s-80s.  No changes to medications today.  Will continue to monitor.  Plan to check basic metabolic panel for renal function and potassium level after a week on the current regimen.

## 2024-05-11 NOTE — Progress Notes (Signed)
 Occupational Therapy Session Note  Patient Details  Name: NELIDA MANDARINO MRN: [id number] Date of Birth: November 17, 1965  Today's Date: 05/11/2024 OT Individual Time: 1120-1205 OT Individual Time Calculation (min): 45 min    Short Term Goals: Week 1:  OT Short Term Goal 1 (Week 1): Pt will maintain static sit EOB with CGA to supervision to engage in UB self care safely. OT Short Term Goal 2 (Week 1): Pt will complete squat pivot transfers from bed >< w/c with mod A or less with 1 person to work towards a safe toilet transfers. OT Short Term Goal 3 (Week 1): Pt will demonstrate improved L arm awareness to attend to arm during bed mobility tasks with min cues. OT Short Term Goal 4 (Week 1): Pt will be able to don pull over shirt with mod A.  Skilled Therapeutic Interventions/Progress Updates:    Pt received in TIS w/c with her dtr [name] present. Pt much more alert today and agreeable to going to the gym.  Her dtr requested to observe.  Pt taken to main gym and with A from rehab tech pt worked on the following: -squat pivot to the R to mat in 2 bouts with max A of 1 to have pt push up and lift hips and then heavy max A of 2 to actually pivot to the R -sitting on mat working on midline/postural control using dynamic reach to the R pushing a ball, mirror for visual feedback, proprioceptive targets to help pt find midline.  Today she was not pushing with her R foot to the L and was able to find midline but could only hold it for a second or 2 and needed mod A and cues to find midline.   -LUE NMR with arm on table for PROM of towel slides with cues from therapist to focus on L arm. Educated pt and dtr on basics of neuroplasticity and pt's need to focus and attend to L side -education with dtr on L neglect and to cue her to turn her head or chin to L vs saying look Left - L hand weight bearing  -squat pivot back to wc to pt's L side with very heavy max A of 2 as pt not lifting up hips as well  Pt  returned to room. 1/2 lap tray does not fit on TIS w/c so used gait belt around a pillow as a makeshift cross body sling to support L arm in seated.  Belt alarm on and all needs met.  Dtr in room with pt.   Therapy Documentation Precautions:  Precautions Precautions: Fall Recall of Precautions/Restrictions: Impaired Restrictions Weight Bearing Restrictions Per Provider Order: No    Vital Signs: Therapy Vitals Temp: 98 F (36.7 C) Pulse Rate: 86 Resp: 20 BP: (!) 146/77 Patient Position (if appropriate): Lying Oxygen Therapy SpO2: 99 % O2 Device: Room Air Pain:  No c/o pain  ADL: ADL Eating: Minimal assistance Grooming: Minimal assistance Upper Body Bathing: Moderate assistance Lower Body Bathing: Maximal assistance Upper Body Dressing: Maximal assistance Lower Body Dressing: Dependent Toileting: Dependent Toilet Transfer: Not assessed (attempted a toilet transfer with stedy, not safe at this time due to strong L lean and push to the L)   Therapy/Group: Individual Therapy  Jensine Luz 05/11/2024, 8:34 AM

## 2024-05-11 NOTE — Patient Care Conference (Cosign Needed Addendum)
 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 (HCC)   Dysphagia due to recent stroke    Expected Discharge Date: Expected Discharge Date: 06/07/24  Team Members Present: Social Worker Present: Rhoda Clement, LCSW Nurse Present: Barnie Ronde, RN PT Present: Sherlean Perks, PT;Dominic Canton, PTA OT Present: Recardo Maxwell, OT SLP Present: Recardo Mole, SLP     Current Status/Progress Goal Weekly Team Focus  Bowel/Bladder   continent of stool and bladder   keep skin intact   avoid skin breakdown    Swallow/Nutrition/ Hydration   Dys 2 and thin   mod I  trials of advanced consistencies, carryover of compensatory strategies, ensure diet tolerance    ADL's   max - total A   min A   LUE NMR, L side awareness, postural/midline awareness and control    Mobility   MaxA overall   overall minA  Sitting balance, midline orientation, L NMRE, bed mobility, transfers    Communication                Safety/Cognition/ Behavioral Observations  formal cog assessment pending            Pain   not in pain   keep pt comfortable, painfree   address pain    Skin   no skin breakdown   skin intact  avoid skin breakdown      Discharge Planning:  HOme with daughter-Amber who is taking a FMLA due to needs childcare for her 60 yo. Other family members who are involved and will  assist   Team Discussion: Patient admitted post right ACA CVA with pusher syndrome, left hemiparesis and sever left neglect.   Patient on target to meet rehab goals: yes, currently needs max assist for bed mobility and max assist for squat pivot transfers. Needs total assist for ADLs. Maintained on a dysphagia 2 thin liquid diet; cognitive assessment pending.  Goals for discharge set for min assist overall.  *See Care Plan and progress notes for long and short-term goals.   Revisions to Treatment Plan:  Cardiology consult Adjusting meds for sleep/wake cycle   Teaching Needs: Safety, medications, dietary modification, transfers, toileting, etc.   Current Barriers to Discharge: Decreased caregiver support and Home enviroment access/layout  Possible Resolutions to Barriers: Family education     Medical Summary Current Status: right aca/mca embolic infarct. afib, chf, htn, hx of pain/headaches  Barriers to Discharge: Medical stability   Possible Resolutions to Barriers/Weekly Focus: analgesic as needed for pain, other modalities as indicated. cardiology monitoring HR/BP and adjusting meds as appropriate, maximizing sleep/wake   Continued Need for Acute Rehabilitation Level of Care: The patient requires daily medical management by a physician with specialized training in physical medicine and rehabilitation for the following reasons: Direction of a multidisciplinary physical rehabilitation program to maximize functional independence : Yes Medical management of patient stability for  increased activity during participation in an intensive rehabilitation regime.: Yes Analysis of laboratory values and/or radiology reports with any subsequent need for medication adjustment and/or medical intervention. : Yes   I attest that I was present, lead the team conference, and concur with the assessment and plan of the team.   Fredericka Sober B 05/12/2024, 8:06 AM

## 2024-05-11 NOTE — Progress Notes (Signed)
 Speech Language Pathology Daily Session Note  Patient Details  Name: [name] MRN: [id number] Date of Birth: [birth year]  Today's Date: 05/11/2024 SLP Individual Time: 9153-9069 SLP Individual Time Calculation (min): 44 min  Short Term Goals: Week 1: SLP Short Term Goal 1 (Week 1): Pt will trial Dys 3 textures with SLP only demonstrating timely and functional oral phase of swallow provided min-mod A cues SLP Short Term Goal 2 (Week 1): Patient will utilize swallowing compensatory strategies to reduce s/sx of aspiration during consumption of PO given min multimodal A SLP Short Term Goal 3 (Week 1): Patient will attend to L side during functional tasks given mod multimodal A  Patient will attend to the left field during functional problem solving tasks in 4/5 opportunities given max multimodal cues. SLP Short Term Goal 4 (Week 1): Patient will participate in a formal cognitive assessment in order to determine potential cognitive intervention  Skilled Therapeutic Interventions:   Patient was seen in am to address cognitive re- training. Pt was alert and seated upright in WC upon SLP arrival. Direct handoff from PT. Pt's dtr, Amber present and participated intermittently throughout session. SLP encouraged oral hygiene and maneuvered pt to sink in order to complete task. SLP noted significant planning and organization deficits, impaired initiation, and reduced sustained attention during task warranting mod cues for completion. Once completed, SLP engaged pt in completion of COGNISTAT to assess cognitive function. Pt with strengths in orientation (minus orientation to city) and attention. Moderate deficits noted in memory, calculations, and executive function with severe deficits noted in constructional ability. Informally, pt with significant processing and attention delays. Discussed findings with pt and family with pt demonstrating awareness of limitations though with impaired insight as she reported  she could probably still manage finances and medications indep. She was agreeable to add long term and short term goals. At conclusion of session, pt left upright in TIS [facility] with family present. SLP to continue POC.   Pain Pain Assessment Pain Scale: 0-10 Pain Score: 0-No pain  Therapy/Group: Individual Therapy  [name] 05/11/2024, 11:21 AM

## 2024-05-11 NOTE — Discharge Summary (Signed)
 Physician Discharge Summary  Patient ID: [name] MRN: [id number] DOB/AGE: 60-Feb-1967 60 y.o.  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 2 without complication (HCC)   Dysphagia due to recent stroke   Coping style affecting medical condition   Discharged Condition: stable  Significant Diagnostic Studies: VAS US  ABI WITH/WO TBI Result Date: 05/25/2024  LOWER EXTREMITY DOPPLER STUDY Patient Name:  [name]  Date of Exam:   05/25/2024 Medical Rec #: [id number]      Accession #:    [id number] Date of Birth: 12-03-1965       Patient Gender: F Patient Age:   55 years Exam Location:  [facility] Procedure:      VAS US  ABI WITH/WO TBI Referring Phys: PRENTICE COMPTON --------------------------------------------------------------------------------  Indications: Claudication. High Risk Factors: Hypertension, hyperlipidemia, Diabetes, prior CVA.  Performing Technologist: Jimmye Scarce RVT  Examination Guidelines: A complete evaluation includes at minimum, Doppler waveform signals and systolic blood pressure reading at the level of bilateral brachial, anterior tibial, and posterior tibial arteries, when vessel segments are accessible. Bilateral testing is considered an integral part of a complete examination. Photoelectric Plethysmograph (PPG) waveforms and toe systolic pressure readings are included as required and additional duplex testing as needed. Limited examinations for reoccurring indications may be  performed as noted.  ABI Findings: +---------+------------------+-----+-----------+--------+ Right    Rt Pressure (mmHg)IndexWaveform   Comment  +---------+------------------+-----+-----------+--------+ Brachial 100                    biphasic            +---------+------------------+-----+-----------+--------+ PTA      81                0.73 multiphasic         +---------+------------------+-----+-----------+--------+ DP       66                0.59 biphasic            +---------+------------------+-----+-----------+--------+ Great Toe52                0.47 Abnormal            +---------+------------------+-----+-----------+--------+ +---------+------------------+-----+--------+-------+ Left     Lt Pressure (mmHg)IndexWaveformComment +---------+------------------+-----+--------+-------+ Brachial 111                    biphasic        +---------+------------------+-----+--------+-------+ PTA      82                0.74 biphasic        +---------+------------------+-----+--------+-------+ DP       91                0.82 biphasic        +---------+------------------+-----+--------+-------+ Great Toe65                0.59 Abnormal        +---------+------------------+-----+--------+-------+ +-------+-----------+-----------+------------+------------+ ABI/TBIToday's ABIToday's TBIPrevious ABIPrevious TBI +-------+-----------+-----------+------------+------------+ Right  0.73  0.47                                +-------+-----------+-----------+------------+------------+ Left   0.82       0.59                                +-------+-----------+-----------+------------+------------+  Summary: Right: Resting right ankle-brachial index indicates moderate right lower extremity arterial disease. The right toe-brachial index is abnormal.  Left: Resting left ankle-brachial index indicates mild left lower extremity arterial disease. The left  toe-brachial index is abnormal.  *See table(s) above for measurements and observations.  Electronically signed by Lonni Gaskins MD on 05/25/2024 at 4:08:26 PM.    Final     Labs:  Basic Metabolic Panel: Recent Labs  Lab 06/02/24 0548 06/03/24 0510 06/06/24 0504  NA  --   --  141  K 3.6 3.6 3.8  CL  --   --  109  CO2  --   --  21*  GLUCOSE  --   --  93  BUN  --   --  9  CREATININE  --   --  1.23*  CALCIUM   --   --  9.3    CBC: Recent Labs  Lab 06/06/24 0504  WBC 8.1  HGB 13.4  HCT 42.6  MCV 83.9  PLT 151     Brief HPI:   [name] is a 58 y.o. female  with history of HTN, Type 2 DM, R posterior MCA stroke (2019), R temporoparietal lobe with upper M2 cutoff 2017, and A. Fib on Xarelto  presented to [facility] by EMS as a Code Stroke on 05/05/2024 after acute onset of left-sided weakness while at work. Initial CT of the head was negative, but CTA of the head and neck showed an abrupt occlusion of the right M1 segment plaque with proximal right ICA stenosis, which had progressed since the last CTA in 2019. Neurology was consulted, and she underwent mechanical thrombectomy with TICI 2B revascularization performed by Dr. Rosslyn. The etiology was determined to be embolic, related to her history of atrial fibrillation (AFib) and noncompliance while on Xarelto . An MRI revealed acute right MCA territory infarcts with particular hemorrhage. The patient experienced dysphagia and was evaluated by speech therapy, leading to a recommendation for a dysphagia 2 diet with thin liquids. Prior to this event, she was independent with activities of daily living (ADLs), worked in [employer], drove, and lived in a one-level home with her daughter. Currently, she requires moderate assistance with mobility and basic ADLs. Cardiology has adjusted her medications in order to simplify her regimen. The patient is currently Mod A with mobility and basic ADLs. Therapy evaluations completed due  to patient decreased functional mobility was admitted for a comprehensive rehab program.     Hospital Course: [name] was admitted to rehab 05/09/2024 for inpatient therapies to consist of PT, ST and OT at least three hours five days a week. Past admission physiatrist, therapy team and rehab RN have worked together to provide customized collaborative inpatient rehab. The patient was admitted with functional deficits due to right MCA and ACA scattered infarcts. PRAFO and WHO devices were used to help maintain hand and foot positioning.  She was maintained on Xarelto  20 mg daily for anticoagulation and managed her headaches with Topamax  and Tylenol  as needed. Insomnia was  treated with trazodone  as needed. Amantadine  was initially prescribed for attention and arousal but was discontinued due to increased lethargy. The patient was placed on a carb-modified diet and received speech therapy for post-stroke dysphagia, tolerating a dysphagia 2 diet well. She experienced hypokalemia, which was managed with daily potassium supplements and continued Aldactone , with stable creatinine and normal BUN levels. For atrial fibrillation, she was closely monitored and continued on metoprolol , which was also part of her hypertension management. Aldactone  was reduced to 12.5 mg, and blood pressure remained stable.  For nonischemic cardiomyopathy and combined heart failure, there were no signs of fluid volume overload, and cardiology recommended discontinuing Entresto . The patient, with a history of prediabetes, was on Farxiga , and her blood sugars were monitored and controlled, eventually discontinuing ACHS monitoring. She continued on Crestor  for hyperlipidemia and was educated on diet and weight loss due to a BMI of 34.84. Leukocytosis resolved after completing antibiotics, and a UTI was treated with Keflex , completed on 2024-05-29. Intermittent right foot pain was noted, with moderate arterial disease detected on the right  and mild on the left, requiring cardiology follow-up. Her foot was warm with reduced pulses. ABIs were obtained, but venous Dopplers were not indicated.   Rehab course: During patient's stay in rehab weekly team conferences were held to monitor patient's progress, set goals and discuss barriers to discharge. At admission, patient required Mod A with mobility and basic ADLs. She has had improvement in activity tolerance, balance, postural control as well as ability to compensate for deficits. She has had improvement in functional use LUE  and LLE as well as improvement in awareness.The patient showed significant progress in her rehabilitation therapy. In occupational therapy, she met all care goals and is discharging at an overall min assist to mod assist level. Her two daughters participated in family education for practical handling. She will benefit from ongoing skilled OT services at home to enhance functional skills in basic ADLs. In physical therapy, the patient met 7 of 10 long term goals, showing improved activity, balance, postural control, and functional use of her left upper and lower extremities. She is discharging at a wheelchair level with mod assist, as she still requires mod assistance for bed mobility, furniture transfers, and ambulation. Continued skilled PT services at home are recommended. In speech therapy, the patient met 5 of 6 long-term goals, discharging at an overall supervision to min assist level. She requires min assist for processing new information and supervision cues for swallowing compensatory strategies to prevent aspiration on a dysphagia 3 thin diet. Follow-up with speech therapy services is advised to enhance cognition and maximize functional independence.  Family teaching completed upon discharge to home.      Disposition:  Discharge disposition: 06-Home-Health Care Svc        Diet: 9/9 D2/thin liquids with continued full supervision and trial tray of D3  solids   Special Instructions:  -No driving or operating heavy machinery until cleared by provider  -No smoking or alcohol or illicit drug use    Discharge Instructions     Ambulatory referral to Physical Medicine Rehab   Complete by: As directed       Allergies as of 06/07/2024       Reactions   Penicillins Hives, Other (See Comments)   Unknown Has patient had a PCN reaction causing immediate rash, facial/tongue/throat swelling, SOB or lightheadedness with hypotension: No Has patient had a PCN reaction causing severe rash involving mucus membranes or skin necrosis: No Has patient  had a PCN reaction that required hospitalization No Has patient had a PCN reaction occurring within the last 10 years: No If all of the above answers are NO, then may proceed with Cephalosporin use.        Medication List     STOP taking these medications    Misc. Devices Misc       TAKE these medications    Accu-Chek FastClix Lancets Misc USE AS INSTRUCTED TO CHECK BLOOD SUGAR ONCE DAILY.   Accu-Chek Guide Me w/Device Kit 1 kit by Does not apply route daily.   Accu-Chek Guide Test test strip Generic drug: glucose blood Use as instructed   acetaminophen  325 MG tablet Commonly known as: TYLENOL  Take 1-2 tablets (325-650 mg total) by mouth every 4 (four) hours as needed for mild pain (pain score 1-3).   feeding supplement Liqd Take 237 mLs by mouth daily.   FreeStyle Tatamy 3 [employer] as directed to test blood glucose   FreeStyle Libre 3 Sensor Misc Place 1 sensor on the skin every 14 days. Use to check glucose continuously   Gerhardt's butt cream Crea Apply 1 Application topically 3 (three) times daily.   metoprolol  200 MG 24 hr tablet Commonly known as: TOPROL -XL Take 1 tablet (200 mg total) by mouth daily. Take with or immediately following a meal.   polyethylene glycol powder 17 GM/SCOOP powder Commonly known as: GLYCOLAX /MIRALAX  Dissolve 1 capful (17g) in  4-8 ounces of liquid and take by mouth daily.   potassium chloride  10 MEQ tablet Commonly known as: KLOR-CON  M Take 1 tablet (10 mEq total) by mouth daily.   rosuvastatin  40 MG tablet Commonly known as: CRESTOR  Take 1 tablet (40 mg total) by mouth daily. What changed:  medication strength how much to take when to take this   Senna-S 8.6-50 MG tablet Generic drug: senna-docusate Take 1 tablet by mouth at bedtime.   spironolactone  25 MG tablet Commonly known as: ALDACTONE  Take 0.5 tablets (12.5 mg total) by mouth daily. What changed: how much to take   Xarelto  20 MG Tabs tablet Generic drug: rivaroxaban  Take 1 tablet (20 mg total) by mouth daily with supper.        Follow-up Information     Delbert Clam, MD Follow up.   Specialty: Family Medicine Why: Call for an appointment. Contact information: [address] [place] [address] [place] [address] [phone]         [facility] Follow up.   Specialty: Neurology Why: Office will call for appointment Contact information: [address] [place]  [address] [phone]        Francyne Headland, MD Follow up.   Specialty: Cardiology Why: Call for an appointment for hospital follow up. Contact information: [address] [place] [address] [phone]         Carilyn Prentice BRAVO, MD Follow up.   Specialty: Physical Medicine and Rehabilitation Why: Office will call for appointment Contact information: [address] [place] [address] [place] [address] [phone]                 Signed: Daphne LITTIE Finders 06/07/2024, 5:15 PM

## 2024-05-11 NOTE — Patient Care Conference (Incomplete Revision)
 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 without complication (HCC)   Dysphagia due to recent stroke    Expected Discharge Date: Expected Discharge Date: 06/07/24  Team Members Present: Social Worker Present: Rhoda Clement, LCSW Nurse Present: Barnie Ronde, RN PT Present: Sherlean Perks, PT;Dominic Logan, PTA OT Present: Recardo Maxwell, OT SLP Present: Recardo Mole, SLP     Current Status/Progress Goal Weekly Team Focus  Bowel/Bladder   continent of stool and bladder   keep skin intact   avoid skin breakdown    Swallow/Nutrition/ Hydration   Dys 2 and thin   mod I  trials of advanced consistencies, carryover of compensatory strategies, ensure diet tolerance    ADL's   max - total A   min A   LUE NMR, L side awareness, postural/midline awareness and control    Mobility               Communication                Safety/Cognition/ Behavioral Observations  formal cog assessment pending            Pain   not in pain   keep pt comfortable, painfree   address pain    Skin   no skin breakdown   skin intact  avoid skin breakdown      Discharge Planning:  HOme with daughter-Amber who is taking a FMLA due to needs childcare for her 60 yo. Other family members who are involved and will assist   Team Discussion: Patient admitted post right ACA CVA with pusher syndrome, left  hemiparesis and sever left neglect.   Patient on target to meet rehab goals: yes, currently needs max assist for bed mobility and max assist for squat pivot transfers. Needs total assist for ADLs. Maintained on a dysphagia 2 thin liquid diet; cognitive assessment pending.  Goals for discharge set for min assist overall.  *See Care Plan and progress notes for long and short-term goals.   Revisions to Treatment Plan:  Cardiology consult Adjusting meds for sleep/wake cycle   Teaching Needs: Safety, medications, dietary modification, transfers, toileting, etc.   Current Barriers to Discharge: Decreased caregiver support and Home enviroment access/layout  Possible Resolutions to Barriers: Family education     Medical Summary Current Status: right aca/mca embolic infarct. afib, chf, htn, hx of pain/headaches  Barriers to Discharge: Medical stability   Possible Resolutions to Barriers/Weekly Focus: analgesic as needed for pain, other modalities as indicated. cardiology monitoring HR/BP and adjusting meds as appropriate, maximizing sleep/wake   Continued Need for Acute Rehabilitation Level of Care: The patient requires daily medical management by a physician with specialized training in physical medicine and rehabilitation for the following reasons: Direction of a multidisciplinary physical rehabilitation program to maximize functional independence : Yes Medical management of patient stability for increased activity during participation in an intensive  rehabilitation regime.: Yes Analysis of laboratory values and/or radiology reports with any subsequent need for medication adjustment and/or medical intervention. : Yes   I attest that I was present, lead the team conference, and concur with the assessment and plan of the team.   Fredericka Barnie NOVAK 05/11/2024, 8:27 PM

## 2024-05-11 NOTE — Plan of Care (Signed)
  Problem: RH Cognition - SLP Goal: RH LTG Patient will demonstrate orientation with cues Description:  LTG:  Patient will demonstrate orientation to person/place/time/situation with cues (SLP)   Flowsheets (Taken 05/11/2024 1132) LTG Patient will demonstrate orientation to:  Place  Situation  Time  Person LTG: Patient will demonstrate orientation using cueing (SLP): Supervision   Problem: RH Problem Solving Goal: LTG Patient will demonstrate problem solving for (SLP) Description: LTG:  Patient will demonstrate problem solving for basic/complex daily situations with cues  (SLP) Flowsheets (Taken 05/11/2024 1132) LTG: Patient will demonstrate problem solving for (SLP): (moderatley complex) -- LTG Patient will demonstrate problem solving for: Minimal Assistance - Patient > 75%   Problem: RH Memory Goal: LTG Patient will demonstrate ability for day to day (SLP) Description: LTG:   Patient will demonstrate ability for day to day recall/carryover during cognitive/linguistic activities with assist  (SLP) Flowsheets (Taken 05/11/2024 1132) LTG: Patient will demonstrate ability for day to day recall: New information LTG: Patient will demonstrate ability for day to day recall/carryover during cognitive/linguistic activities with assist (SLP): Minimal Assistance - Patient > 75% Goal: LTG Patient will use memory compensatory aids to (SLP) Description: LTG:  Patient will use memory compensatory aids to recall biographical/new, daily complex information with cues (SLP) Flowsheets (Taken 05/11/2024 1132) LTG: Patient will use memory compensatory aids to (SLP): Minimal Assistance - Patient > 75%   Problem: RH Attention Goal: LTG Patient will demonstrate this level of attention during functional activites (SLP) Description: LTG:  Patient will will demonstrate this level of attention during functional activites (SLP) Flowsheets (Taken 05/11/2024 1132) Patient will demonstrate during cognitive/linguistic  activities the attention type of: Sustained Patient will demonstrate this level of attention during cognitive/linguistic activities in: Controlled LTG: Patient will demonstrate this level of attention during cognitive/linguistic activities with assistance of (SLP): Supervision

## 2024-05-11 NOTE — Progress Notes (Addendum)
 Patient ID: [name], female   DOB: 09-01-1966, 58 y.o.   MRN: [id number]  met with pt and [name]-daughter to gibe team conference update with goals of min assist level and target discharge date of 9/23. Discussed the need to get pt form pushing and more alert along with moving. Both are agreeable but pt would like to go home sooner if possible. Will see Amber-other daughter in am to give completed FMLA forms. Encouraged daughter to talk with other family members to come up with a 24/7 care plan

## 2024-05-12 DIAGNOSIS — I63521 Cerebral infarction due to unspecified occlusion or stenosis of right anterior cerebral artery: Secondary | ICD-10-CM | POA: Diagnosis not present

## 2024-05-12 LAB — CBC
HCT: 44.5 % (ref 36.0–46.0)
Hemoglobin: 14.4 g/dL (ref 12.0–15.0)
MCH: 26.9 pg (ref 26.0–34.0)
MCHC: 32.4 g/dL (ref 30.0–36.0)
MCV: 83 fL (ref 80.0–100.0)
Platelets: 231 K/uL (ref 150–400)
RBC: 5.36 MIL/uL — ABNORMAL HIGH (ref 3.87–5.11)
RDW: 14 % (ref 11.5–15.5)
WBC: 12.3 K/uL — ABNORMAL HIGH (ref 4.0–10.5)
nRBC: 0 % (ref 0.0–0.2)

## 2024-05-12 LAB — GLUCOSE, CAPILLARY
Glucose-Capillary: 113 mg/dL — ABNORMAL HIGH (ref 70–99)
Glucose-Capillary: 109 mg/dL — ABNORMAL HIGH (ref 70–99)
Glucose-Capillary: 104 mg/dL — ABNORMAL HIGH (ref 70–99)
Glucose-Capillary: 92 mg/dL (ref 70–99)

## 2024-05-12 MED ORDER — INSULIN ASPART 100 UNIT/ML IJ SOLN: 0.0000 [IU] | Freq: Every morning | INTRAMUSCULAR | Status: DC

## 2024-05-12 NOTE — Progress Notes (Signed)
 Physical Therapy Session Note  Patient Details  Name: [name] MRN: [id number] Date of Birth: 02-13-1966  Today's Date: 05/12/2024 PT Individual Time: 1048-1200 PT Individual Time Calculation (min): 72 min   Short Term Goals: Week 1:  PT Short Term Goal 1 (Week 1): Pt will transfer sup to sit w/ max A x 1. PT Short Term Goal 2 (Week 1): Pt will transfer bed <> w/c w/ max A x 1 consistently, SB or SPT. PT Short Term Goal 3 (Week 1): PT to assess gait. PT Short Term Goal 4 (Week 1): Pt to maintain seated balance w/o trunk support x 10' w/ min A.  Skilled Therapeutic Interventions/Progress Updates: Patient sitting in TIS with family present on entrance to room. Patient alert and agreeable to PT session.   Patient reported no pain during session. Pt transported in TIS outside of [facility] to increase pt's alertness per fatigue presentation (discussed with pt's NP about seeing if any medications given to pt are potentially causing increase in drowsiness). Pt transported back up to main gym on rehab floor to further participate in NMRE.  At end of session, PTA rearranged bed/recliner/TIS so that family that has been consistently present to sit to L of pt to increase L sided awareness with education provided to rationale.  Discussed with pt's daughters about future possibility of needing a ramp to enter home in order to give family full picture of possible outcomes, or looking into BHR's as pt's household does not have any with pt family understanding.   Therapeutic Activity: Transfers: Pt performed sit<>stands when outside from TIS with modA and PTA under L LE providing L knee block and cues for powering up through B LE's and to reach back to control descent. Pt performed sit<>stands in STEDY with maxA due to R UE on front railing and pt presenting with increase in push, as well as heightened position from STEDY foot platform (same cues to power up through B LE's, and pt requiring max cuing to pull up  to bar vs push with R UE, as well as to reach back to arm rest to control descent).   Neuromuscular Re-ed: NMR facilitated during session with focus on static/dynamic standing tolerance, weight shift, proprioceptive feedback, attention to task, neuromuscular connection to L affected side, midline orientation. - Pt performed static stance with maxA + 2 (BHHA) from TIS with PTA on R side under UE and providing knee block. Pt performed multiple bouts with rest breaks required. Pt cued throughout to increase upright standing posture while looking and talking to family in front. Pt also participated in lateral weight shifts with added multimodal cuing to slightly bend R knee to decrease favoring of knee extension (pushing to L with added tactile stimulation of R knee flexors). Pt also cued to advance R LE anteriorly (pt advanced LE towards midline vs anteriorly, and required maxA + 2 to perform).  - Pt performed same static stance/lateral weight shift in STEDY with cues to maintain R UE on thighs to avoid increase in push to L. Pt also with mirror feedback with green theraband in middle to promote midline orientation. Pt cued to slightly bend R knee while PTA provided tactile feedback to L knee for extensor response (cues added to lean to wards rehab tech on R). Pt required multiple seated rest breaks. PTA placed 2 rolled up wash cloths under R heel to shorten gastrocnemius in order to further assess if this would aid in pt's extensor bias on R with  slightly notable improvement, but difficult to further determine due to pt being in STEDY and having pads/bars surrounding hips.   NMR performed for improvements in motor control and coordination, balance, sequencing, judgement, and self confidence/ efficacy in performing all aspects of mobility at highest level of independence.   Patient sitting in TIS at end of session with brakes locked, family present, belt alarm set, and all needs within reach.      Therapy  Documentation Precautions:  Precautions Precautions: Fall Recall of Precautions/Restrictions: Impaired Restrictions Weight Bearing Restrictions Per Provider Order: No  Therapy/Group: Individual Therapy  Azariya Freeman PTA 05/12/2024, 12:09 PM

## 2024-05-12 NOTE — Telephone Encounter (Signed)
 ERROR

## 2024-05-12 NOTE — Progress Notes (Signed)
 Speech Language Pathology Daily Session Note  Patient Details  Name: [name] MRN: [id number] Date of Birth: 03-04-1966  Today's Date: 05/12/2024 SLP Individual Time: 1356-1456 SLP Individual Time Calculation (min): 60 min  Short Term Goals: Week 1: SLP Short Term Goal 1 (Week 1): Pt will trial Dys 3 textures with SLP only demonstrating timely and functional oral phase of swallow provided min-mod A cues SLP Short Term Goal 2 (Week 1): Patient will utilize swallowing compensatory strategies to reduce s/sx of aspiration during consumption of PO given min multimodal A SLP Short Term Goal 3 (Week 1): Patient will attend to L side during functional tasks given mod multimodal A  Patient will attend to the left field during functional problem solving tasks in 4/5 opportunities given max multimodal cues. SLP Short Term Goal 4 (Week 1): Patient will participate in a formal cognitive assessment in order to determine potential cognitive intervention  Skilled Therapeutic Interventions: Skilled therapy session focused on cognitive goals. SLP facilitated session by targeting L attention. SLP placed visual anchors on table span located at patients midline and left. SLP then placed numbers, days of the week, and months of the years on table for patient to sequence. Patient required modA to locate and sequence items. SLP educated patient on importance of locating midline and turning to L when engaging in conversation, completing functional tasks, etc. Patient left in chair with alarm set and call bell in reach. Continue POC.   Pain Denies  Therapy/Group: Individual Therapy  Dezarai Prew M.A., CCC-SLP 05/12/2024, 7:25 AM

## 2024-05-12 NOTE — Plan of Care (Signed)
  Problem: Consults Goal: RH STROKE PATIENT EDUCATION Description: See Patient Education module for education specifics  Outcome: Progressing Goal: Nutrition Consult-if indicated Outcome: Progressing Goal: Diabetes Guidelines if Diabetic/Glucose > 140 Description: If diabetic or lab glucose is > 140 mg/dl - Initiate Diabetes/Hyperglycemia Guidelines & Document Interventions  Outcome: Progressing   Problem: RH BOWEL ELIMINATION Goal: RH STG MANAGE BOWEL WITH ASSISTANCE Description: STG Manage Bowel with mod I Assistance. Outcome: Progressing Goal: RH STG MANAGE BOWEL W/MEDICATION W/ASSISTANCE Description: STG Manage Bowel with Medication with mod I Assistance. Outcome: Progressing   Problem: RH SAFETY Goal: RH STG ADHERE TO SAFETY PRECAUTIONS W/ASSISTANCE/DEVICE Description: STG Adhere to Safety Precautions With cues Assistance/Device. Outcome: Progressing   Problem: RH KNOWLEDGE DEFICIT Goal: RH STG INCREASE KNOWLEDGE OF DIABETES Description: Patient and family will be able to manage DM using educational resources for medications and dietary modification independently Outcome: Progressing Goal: RH STG INCREASE KNOWLEDGE OF HYPERTENSION Description: Patient and family will be able to manage HTN using educational resources for medications and dietary modification independently Outcome: Progressing Goal: RH STG INCREASE KNOWLEDGE OF DYSPHAGIA/FLUID INTAKE Description: Patient and family will be able to manage Dysphagia using educational resources for medications and dietary modification independently Outcome: Progressing Goal: RH STG INCREASE KNOWLEGDE OF HYPERLIPIDEMIA Description: Patient and family will be able to manage HLD using educational resources for medications and dietary modification independently Outcome: Progressing Goal: RH STG INCREASE KNOWLEDGE OF STROKE PROPHYLAXIS Description: Patient and family will be able to manage secondary risks using educational resources  for medications and dietary modification independently Outcome: Progressing   Problem: Education: Goal: Ability to describe self-care measures that may prevent or decrease complications (Diabetes Survival Skills Education) will improve Outcome: Progressing Goal: Individualized Educational Video(s) Outcome: Progressing   Problem: Coping: Goal: Ability to adjust to condition or change in health will improve Outcome: Progressing   Problem: Fluid Volume: Goal: Ability to maintain a balanced intake and output will improve Outcome: Progressing   Problem: Health Behavior/Discharge Planning: Goal: Ability to identify and utilize available resources and services will improve Outcome: Progressing Goal: Ability to manage health-related needs will improve Outcome: Progressing   Problem: Metabolic: Goal: Ability to maintain appropriate glucose levels will improve Outcome: Progressing   Problem: Nutritional: Goal: Maintenance of adequate nutrition will improve Outcome: Progressing Goal: Progress toward achieving an optimal weight will improve Outcome: Progressing   Problem: Skin Integrity: Goal: Risk for impaired skin integrity will decrease Outcome: Progressing   Problem: Tissue Perfusion: Goal: Adequacy of tissue perfusion will improve Outcome: Progressing

## 2024-05-12 NOTE — Progress Notes (Signed)
 Occupational Therapy Session Note  Patient Details  Name: [name] MRN: [id number] Date of Birth: 22-Nov-1965  Today's Date: 05/12/2024 OT Individual Time: 9155-9041 OT Individual Time Calculation (min): 74 min    Short Term Goals: Week 1:  OT Short Term Goal 1 (Week 1): Pt will maintain static sit EOB with CGA to supervision to engage in UB self care safely. OT Short Term Goal 2 (Week 1): Pt will complete squat pivot transfers from bed >< w/c with mod A or less with 1 person to work towards a safe toilet transfers. OT Short Term Goal 3 (Week 1): Pt will demonstrate improved L arm awareness to attend to arm during bed mobility tasks with min cues. OT Short Term Goal 4 (Week 1): Pt will be able to don pull over shirt with mod A.  Skilled Therapeutic Interventions/Progress Updates:  Pt greeted alseep in supine, pt easily able to arouse but continued to close her eyes often during session needing max cues to keep eyes open and attend to L side.  pt agreeable to OT intervention.      Transfers/bed mobility/functional mobility:  Pt rolled in bed for LB dressing/pericare. Pt required MAX A to roll to R side and MODA to roll to L side. Pt completed supine>sit with MAX A +2 with pt needing assist to mange LLE and elevate trunk fully. Pt able to maintain intermittent sitting balance with RUE supported on arm rest of w/c with CGA.   Pt completed a squat pivot transfer to w/c to R side with MAXA +2.    ADLs:  Grooming: pt completed seated oral care at sink with MIN A with education provided on hemi technique for donning paste on toothbrush with fair carryover.  UB dressing:donned [place] shirt with MAX A with pt needing max cues for hemi technique to don LUE first.  LB dressing: donned pants/brief from bed level with total A with pt rolling R<>L with MOD- MAX A. Pt also able to partially bridge to pull pants to waist line.    Bathing: bathed LB from bed level with pt able to cleanse periarea from  upright position in bed, washed remainder of LB with total A from bed level. Pt completed UB bathing at sink in front of mirror with a focus on L sided awareness and anterior weight shifts in sitting.                  Ended session with pt reclined in TIS with all needs within reach and safety belt alarm activated.                    Therapy Documentation Precautions:  Precautions Precautions: Fall Recall of Precautions/Restrictions: Impaired Restrictions Weight Bearing Restrictions Per Provider Order: No  Pain: no pain     Therapy/Group: Individual Therapy  Ronal Gift [facility] 05/12/2024, 12:15 PM

## 2024-05-12 NOTE — Progress Notes (Signed)
 PROGRESS NOTE   Subjective/Complaints: No new issues today. Able to sleep. PO intake could be a little better  ROS: Limited due to cognitive/behavioral    Objective:   No results found. Recent Labs    05/10/24 0447 05/12/24 0500  WBC 12.1* 12.3*  HGB 13.3 14.4  HCT 41.7 44.5  PLT 206 231   Recent Labs    05/09/24 1916 05/10/24 0447  NA 136 137  K 4.0 3.8  CL 104 104  CO2 23 23  GLUCOSE 110* 102*  BUN 11 11  CREATININE 0.91 0.97  CALCIUM  9.3 9.4    Intake/Output Summary (Last 24 hours) at 05/12/2024 1138 Last data filed at 05/12/2024 1000 Gross per 24 hour  Intake 598 ml  Output --  Net 598 ml        Physical Exam: Vital Signs Blood pressure (!) 127/109, pulse 85, temperature 98.8 F (37.1 C), resp. rate 18, height 5' 3.5 (1.613 m), weight 88.6 kg, SpO2 97%.  Constitutional: No distress . Vital signs reviewed. HEENT: NCAT, EOMI, oral membranes moist Neck: supple Cardiovascular: RRR without murmur. No JVD    Respiratory/Chest: CTA Bilaterally without wheezes or rales. Normal effort    GI/Abdomen: BS +, non-tender, non-distended Ext: no clubbing, cyanosis, or edema Psych: pleasant and cooperative  Skin: Clean and intact without signs of breakdown Neuro:  pt is alert. Right gaze preference. Follows basic commands. Soft voice, speech generally clear only sl dysarthria, left central VII. MMT:LUE 0/5. ?perhaps trace HE LLE but otherwise 0/5. Moves right side freely. Senses pain and light touch in all 4's. LUE DTR's 1+. LLE DTR's brisk 3+ with early extensor tone, toes up.  Prior neuro assessment is c/w 05/12/2024 exam.  Musculoskeletal: no pain with attempts at AROM/PROM    Assessment/Plan: 1. Functional deficits which require 3+ hours per day of interdisciplinary therapy in a comprehensive inpatient rehab setting. Physiatrist is providing close team supervision and 24 hour management of active medical  problems listed below. Physiatrist and rehab team continue to assess barriers to discharge/monitor patient progress toward functional and medical goals  Care Tool:  Bathing    Body parts bathed by patient: Chest, Abdomen, Front perineal area, Face, Right upper leg   Body parts bathed by helper: Right arm, Left arm, Buttocks, Right lower leg, Left lower leg, Left upper leg     Bathing assist Assist Level: Maximal Assistance - Patient 24 - 49%     Upper Body Dressing/Undressing Upper body dressing   What is the patient wearing?: Pull over shirt    Upper body assist Assist Level: Maximal Assistance - Patient 25 - 49%    Lower Body Dressing/Undressing Lower body dressing      What is the patient wearing?: Incontinence brief, Pants     Lower body assist Assist for lower body dressing: 2 Helpers     Toileting Toileting    Toileting assist Assist for toileting: 2 Helpers     Transfers Chair/bed transfer  Transfers assist  Chair/bed transfer activity did not occur: Safety/medical concerns  Chair/bed transfer assist level: 2 Helpers (SPT or SB)     Locomotion Ambulation   Ambulation assist   Ambulation  activity did not occur: Safety/medical concerns          Walk 10 feet activity   Assist  Walk 10 feet activity did not occur: Safety/medical concerns        Walk 50 feet activity   Assist Walk 50 feet with 2 turns activity did not occur: Safety/medical concerns         Walk 150 feet activity   Assist Walk 150 feet activity did not occur: Safety/medical concerns         Walk 10 feet on uneven surface  activity   Assist Walk 10 feet on uneven surfaces activity did not occur: Safety/medical concerns         Wheelchair     Assist Is the patient using a wheelchair?: Yes Type of Wheelchair: Manual    Wheelchair assist level: Dependent - Patient 0% Max wheelchair distance: 150    Wheelchair 50 feet with 2 turns  activity    Assist        Assist Level: Dependent - Patient 0%   Wheelchair 150 feet activity     Assist      Assist Level: Dependent - Patient 0%   Blood pressure (!) 127/109, pulse 85, temperature 98.8 F (37.1 C), resp. rate 18, height 5' 3.5 (1.613 m), weight 88.6 kg, SpO2 97%.  Medical Problem List and Plan: 1. 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 Pt is s/p IR w TICI2b. 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  Antithrombotics: -DVT/anticoagulation:  Pharmaceutical: Xarelto              -antiplatelet therapy: N/A 3. Pain Management: Tylenol  prn for pain              --Has been on topamax  for HA in the past.   -pain seems controlled 4. Mood/Behavior/Sleep: LCSW to follow for evaluaton and support.              --trazodone  prn for insomnia.              -antipsychotic agents: N/A 5. Neuropsych/cognition: This patient is capable of making decisions on her own behalf. 6. Skin/Wound Care: Routine pressure relief measures.  7. Fluids/Electrolytes/Nutrition: Monitor I/O. Add CM restrictions to diet             -Post stroke Dysphagia --SLP following. D2 diet/thins            -encourage PO, protein supp for low albumin  8. A fib: Monitor HR TID-- Metoprolol  succinate 200 mg daily  --Continue Xarelto .  9. HTN: Monitor BP TID. On metoprolol  and aldactone   -bp borderline currently--obsv today 10. NICM w/HFrEF: Discussed with Cardiology Dr. Maceo. Digoxin  and metoprolol  tartrate was stooped. Entresto  not continued. She is continued on spironolactone  and Metoprolol  succinate 200mg  daily in the AM.  11.  Pre-diabetes: On Farxiga  and should help w/BS. Added CM restrictions to diet.  --Monitor BS ac/hs and use SSI tor elevated BS.  CBG (last 3)   Recent Labs    05/11/24 1611 05/11/24 2123 05/12/24 0612  GLUCAP 98 107* 113*   8/28 still controlled. Change checks to  qam only 12. HLD- LDL 109 crestor  40 mg daily  13. Obesity:  BMI 34.84 educate on diet and weight loss to promote overall health and mobility.  14. Leukocytosis: no s/s infection WBC 14.2--13.9--11.9   -8/28 wbc holding at 12.8, f/u Monday   15. Slow trans constipation:   -miralax  scheduled with prns  -last bm 8/26  LOS: 3 days A FACE TO FACE EVALUATION WAS PERFORMED  [name] 05/12/2024, 11:38 AM

## 2024-05-12 NOTE — IPOC Note (Signed)
 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 (HCC)   Dysphagia due to recent stroke     Functional Problem List: Nursing Safety, Bowel, Endurance, Medication Management, Nutrition  PT Balance, Safety, Perception, Endurance, Motor  OT Balance, Cognition, Endurance, Motor, Perception, Vision, Sensory, Safety  SLP Cognition, Nutrition  TR         Basic ADL's: OT Bathing, Dressing, Toileting, Grooming, Eating     Advanced  ADL's: OT None     Transfers: PT Bed Mobility, Bed to Chair, Car, Occupational psychologist, Research scientist (life sciences): PT Ambulation, Stairs     Additional Impairments: OT Fuctional Use of Upper Extremity  SLP Swallowing      TR      Anticipated Outcomes Item Anticipated Outcome  Self Feeding independent  Swallowing  Mod I   Basic self-care  Min A  Toileting  Min A   Bathroom Transfers Min A  Bowel/Bladder  manage bowel w mod I assist  Transfers  min A  Locomotion  Min A w/ LRAD  Communication     Cognition  Sup A  Pain  n/a  Safety/Judgment  manage safety w cues   Therapy Plan: PT Intensity: Minimum of 1-2 x/day ,45 to 90 minutes PT Frequency: 5 out of 7 days PT Duration Estimated Length of Stay: 21-24 days. OT Intensity: Minimum of 1-2 x/day, 45 to 90 minutes OT Frequency: 5 out of 7 days OT Duration/Estimated Length of Stay: 21-24 days SLP Intensity: Minumum of 1-2 x/day, 30 to 90 minutes SLP Frequency: 3 to 5 out of 7 days SLP Duration/Estimated Length of Stay: 21-24   Team Interventions: Nursing Interventions Patient/Family Education, Medication Management, Bowel Management, Dysphagia/Aspiration Precaution Training, Disease Management/Prevention,  Discharge Planning  PT interventions Ambulation/gait training, Community reintegration, Neuromuscular re-education, Stair training, UE/LE Strength taining/ROM, UE/LE Coordination activities, Therapeutic Activities, Discharge planning, Functional mobility training, Splinting/orthotics, Patient/family education, Therapeutic Exercise  OT Interventions Balance/vestibular training, Cognitive remediation/compensation, Discharge planning, DME/adaptive equipment instruction, Functional mobility training, Functional electrical stimulation, Patient/family education, Psychosocial support, Splinting/orthotics, Self Care/advanced ADL retraining, Therapeutic Activities, Therapeutic Exercise, UE/LE Strength taining/ROM, UE/LE Coordination activities, Visual/perceptual remediation/compensation, Neuromuscular re-education  SLP Interventions Cognitive remediation/compensation, Speech/Language facilitation, Functional tasks, Therapeutic Activities, Internal/external aids, Therapeutic Exercise, Dysphagia/aspiration precaution training, Patient/family education  TR Interventions    SW/CM Interventions Discharge Planning, Psychosocial Support, Patient/Family Education   Barriers to Discharge MD  Medical stability  Nursing Decreased caregiver support, Home environment access/layout 1 level 3 ste no rails w dtr  PT Inaccessible home environment, Home environment access/layout    OT      SLP      SW Insurance for SNF coverage     Team Discharge Planning: Destination: PT-Home ,OT- Home , SLP-Home Projected Follow-up: PT-Home health PT, OT-  Home health OT, SLP-None Projected Equipment Needs: PT-To be determined, OT- Tub/shower bench, 3 in 1 bedside comode, SLP-None recommended by SLP Equipment Details: PT-Pt has no DME PTA, OT-  Patient/family involved in discharge planning: PT- Patient,  OT-Patient, SLP-Patient, Family member/caregiver  MD ELOS: 21-24 days Medical Rehab Prognosis:  Excellent Assessment: The  patient has been admitted for CIR therapies with the diagnosis of right ACA stroke. The team will be addressing functional mobility, strength, stamina,  balance, safety, adaptive techniques and equipment, self-care, bowel and bladder mgt, patient and caregiver education, NMR, cognition, communication, community reentry. Goals have been set at min assist goals with mobility and self-care, mod I with swallowing and cognition. Anticipated discharge destination is home with family.        See Team Conference Notes for weekly updates to the plan of care

## 2024-05-12 NOTE — Plan of Care (Signed)
  Problem: Consults Goal: RH STROKE PATIENT EDUCATION Description: See Patient Education module for education specifics  Outcome: Progressing Goal: Nutrition Consult-if indicated Outcome: Progressing Goal: Diabetes Guidelines if Diabetic/Glucose > 140 Description: If diabetic or lab glucose is > 140 mg/dl - Initiate Diabetes/Hyperglycemia Guidelines & Document Interventions  Outcome: Progressing   Problem: RH BOWEL ELIMINATION Goal: RH STG MANAGE BOWEL WITH ASSISTANCE Description: STG Manage Bowel with mod I Assistance. Outcome: Progressing Goal: RH STG MANAGE BOWEL W/MEDICATION W/ASSISTANCE Description: STG Manage Bowel with Medication with mod I Assistance. Outcome: Progressing   Problem: RH SAFETY Goal: RH STG ADHERE TO SAFETY PRECAUTIONS W/ASSISTANCE/DEVICE Description: STG Adhere to Safety Precautions With cues Assistance/Device. Outcome: Progressing   Problem: RH KNOWLEDGE DEFICIT Goal: RH STG INCREASE KNOWLEDGE OF DIABETES Description: Patient and family will be able to manage DM using educational resources for medications and dietary modification independently Outcome: Progressing Goal: RH STG INCREASE KNOWLEDGE OF HYPERTENSION Description: Patient and family will be able to manage HTN using educational resources for medications and dietary modification independently Outcome: Progressing Goal: RH STG INCREASE KNOWLEDGE OF DYSPHAGIA/FLUID INTAKE Description: Patient and family will be able to manage Dysphagia using educational resources for medications and dietary modification independently Outcome: Progressing Goal: RH STG INCREASE KNOWLEGDE OF HYPERLIPIDEMIA Description: Patient and family will be able to manage HLD using educational resources for medications and dietary modification independently Outcome: Progressing Goal: RH STG INCREASE KNOWLEDGE OF STROKE PROPHYLAXIS Description: Patient and family will be able to manage secondary risks using educational resources  for medications and dietary modification independently Outcome: Progressing   Problem: Education: Goal: Ability to describe self-care measures that may prevent or decrease complications (Diabetes Survival Skills Education) will improve Outcome: Progressing Goal: Individualized Educational Video(s) Outcome: Progressing   Problem: Coping: Goal: Ability to adjust to condition or change in health will improve Outcome: Progressing   Problem: Fluid Volume: Goal: Ability to maintain a balanced intake and output will improve Outcome: Progressing   Problem: Health Behavior/Discharge Planning: Goal: Ability to identify and utilize available resources and services will improve Outcome: Progressing Goal: Ability to manage health-related needs will improve Outcome: Progressing

## 2024-05-13 DIAGNOSIS — I63521 Cerebral infarction due to unspecified occlusion or stenosis of right anterior cerebral artery: Secondary | ICD-10-CM | POA: Diagnosis not present

## 2024-05-13 DIAGNOSIS — I10 Essential (primary) hypertension: Secondary | ICD-10-CM | POA: Diagnosis not present

## 2024-05-13 DIAGNOSIS — F54 Psychological and behavioral factors associated with disorders or diseases classified elsewhere: Secondary | ICD-10-CM

## 2024-05-13 DIAGNOSIS — D72829 Elevated white blood cell count, unspecified: Secondary | ICD-10-CM | POA: Diagnosis not present

## 2024-05-13 DIAGNOSIS — G8111 Spastic hemiplegia affecting right dominant side: Secondary | ICD-10-CM | POA: Diagnosis not present

## 2024-05-13 LAB — GLUCOSE, CAPILLARY
Glucose-Capillary: 102 mg/dL — ABNORMAL HIGH (ref 70–99)
Glucose-Capillary: 95 mg/dL (ref 70–99)
Glucose-Capillary: 102 mg/dL — ABNORMAL HIGH (ref 70–99)
Glucose-Capillary: 130 mg/dL — ABNORMAL HIGH (ref 70–99)

## 2024-05-13 MED ORDER — GERHARDT'S BUTT CREAM
TOPICAL_CREAM | Freq: Three times a day (TID) | CUTANEOUS | Status: DC
  Administered 2024-05-25 – 2024-05-30 (×2): 1 via TOPICAL
  Filled 2024-05-13: qty 60

## 2024-05-13 MED ORDER — AMANTADINE HCL 100 MG PO CAPS
100.0000 mg | ORAL_CAPSULE | Freq: Two times a day (BID) | ORAL | Status: DC
  Administered 2024-05-13 – 2024-05-30 (×33): 100 mg via ORAL
  Filled 2024-05-13 (×33): qty 1

## 2024-05-13 NOTE — Progress Notes (Signed)
 Rather erratic BP readings, but overall fair BP control and good rate control. Cardiology will follow from a distance. Please call if there are problems over the weekend.

## 2024-05-13 NOTE — Progress Notes (Signed)
 Speech Language Pathology Daily Session Note  Patient Details  Name: PEREL HAUSCHILD MRN: [id number] Date of Birth: 04/07/1966  Today's Date: 05/13/2024 SLP Individual Time: 8969-8884 SLP Individual Time Calculation (min): 45 min  Short Term Goals: Week 1: SLP Short Term Goal 1 (Week 1): Pt will trial Dys 3 textures with SLP only demonstrating timely and functional oral phase of swallow provided min-mod A cues SLP Short Term Goal 2 (Week 1): Patient will utilize swallowing compensatory strategies to reduce s/sx of aspiration during consumption of PO given min multimodal A SLP Short Term Goal 3 (Week 1): Patient will attend to L side during functional tasks given mod multimodal A  Patient will attend to the left field during functional problem solving tasks in 4/5 opportunities given max multimodal cues. SLP Short Term Goal 4 (Week 1): Patient will participate in a formal cognitive assessment in order to determine potential cognitive intervention  Skilled Therapeutic Interventions: Skilled therapy session focused on cognitive goals. SLP facilitated session by targeting L attention. SLP placed colored cards across table span for patient to sort. Patient required mod-maxA, though limited by fatigue. Patient oriented to self, situation, location and year - though not to day of the week nor date. SLP introduced calendar to assist in orientation. Patient left in chair with alarm set and call bell in reach. Continue POC.   Pain Denies  Therapy/Group: Individual Therapy  Ileane Sando M.A., CCC-SLP 05/13/2024, 7:39 AM

## 2024-05-13 NOTE — Progress Notes (Signed)
 Occupational Therapy Session Note  Patient Details  Name: [name] MRN: [id number] Date of Birth: 1966/07/24  Today's Date: 05/13/2024 OT Individual Time: 1400-1502 OT Individual Time Calculation (min): 62 min    Short Term Goals: Week 1:  OT Short Term Goal 1 (Week 1): Pt will maintain static sit EOB with CGA to supervision to engage in UB self care safely. OT Short Term Goal 2 (Week 1): Pt will complete squat pivot transfers from bed >< w/c with mod A or less with 1 person to work towards a safe toilet transfers. OT Short Term Goal 3 (Week 1): Pt will demonstrate improved L arm awareness to attend to arm during bed mobility tasks with min cues. OT Short Term Goal 4 (Week 1): Pt will be able to don pull over shirt with mod A.  Skilled Therapeutic Interventions/Progress Updates:     Pt received sleeping in bed, requiring increased time to wake and initiate participation in OT session. Pt presenting to be in good spirits receptive to skilled OT session reporting 0/10 pain- OT offering intermittent rest breaks, repositioning, and therapeutic support to optimize participation in therapy session. Focused this session on transitional movements, body awareness, L side attention, visual scanning, and midline awareness. Pt transitioned to EOB to R with MAX A +2 +increased time. Once sitting EOB, Pt initially requiring MAX A to maintain sitting balance, however with improved positioning with B LEs supported on floor, R UE positioned in lap, and verbal/tactile cues for midline orientation Pt was then able to maintain static sitting balance with CGA to MIN A. Spent time working on L side attention and trunk control with Pt tasked with reaching across midline towards target and then return trunk to midline with Pt able to complete 6 trials with MOD A fading to MIN A with increased practice. Positioned large vertical anterior to Pt with bolded line down the middle for improved midline orientation- Pt was  able to orient herself to midline, however required consistent verbal cues. Positioned squigz on mirror and engaged Pt in visual scanning and functional reaching activity with Pt tasked with locating specified colored squigz on mirror by visually scanning R<>L and then utilizing R UE to retrieve squigz and pass it to OT on her L side. Education provided on visual scanning technique utilizing outside of mirror as visual anchors. MAX multimodal cues required to turn head to L to locate squigz- Pt often would turn head, however keep eyes focused to R. Improvement noted when visually tracking OT's finger to the L. Pt then completed visual scanning activity locating numbers 1-10 on large vertical mirror and using R UE to erase the numbers in order to work on functional cognition and working memory in addition to visual scanning, sitting balance, and trunk control. MAX multimodal cues +increased time required to locate each number and MIN A required to return trunk to midline. Pt fatigued at end of session. She returned to bed with MAX A to lower trunk and bring B LEs into bed- Pt able to lift R LE into bed. Positioned Pt in side lying with pillow support between knees, head, and under L UE. Pt was left resting in bed with call bell in reach, bed alarm on, and all needs met.    Therapy Documentation Precautions:  Precautions Precautions: Fall Recall of Precautions/Restrictions: Impaired Restrictions Weight Bearing Restrictions Per Provider Order: No   Therapy/Group: Individual Therapy  Katheryn SHAUNNA [place] 05/13/2024, 3:05 PM

## 2024-05-13 NOTE — Progress Notes (Signed)
 Physical Therapy Session Note  Patient Details  Name: [name] MRN: [id number] Date of Birth: April 01, 1966  Today's Date: 05/13/2024 PT Individual Time: 9063-8969; 1115 - 1202 PT Individual Time Calculation (min): 54 min; 47 min   Short Term Goals: Week 1:  PT Short Term Goal 1 (Week 1): Pt will transfer sup to sit w/ max A x 1. PT Short Term Goal 2 (Week 1): Pt will transfer bed <> w/c w/ max A x 1 consistently, SB or SPT. PT Short Term Goal 3 (Week 1): PT to assess gait. PT Short Term Goal 4 (Week 1): Pt to maintain seated balance w/o trunk support x 10' w/ min A.  SESSION 1 Skilled Therapeutic Interventions/Progress Updates: Patient semi-reclined in bed with family present on entrance to room. Patient alert and agreeable to PT session.   Patient reported tightness/unrated pain in R LE (specifically plantarflexors) with manual therapy to follow.   Therapeutic Activity: Bed Mobility: Pt performed supine<R/L sidelying to donn personal shorts with maxA + 2 and cues for use of HOB railing with R UE to assist. Pt performed supine<sit on EOB with maxA (roll to R with multimodal cues for use of R UE to assist with truncal elevation).  Transfers: Pt performed squat pivot transfer from EOB<TIS, and TIS<>mat with maxA and multimodal cuing for head/hip relationship and set up (increased cuing to pull with R UE on far arm rest vs pushing).   Pt sitting EOB while PTA, tech and daughter assisted with doffing shirt, and donning new shirt over old shirt to protect pt's modesty with maxA. Pt required increased cuing to pull R UE through threading of sleeve (would push UE back through shirt).  Neuromuscular Re-ed: NMR facilitated during session with focus on midline orientation, proprioceptive feedback L LE, neuromuscular control. - Pt sitting edge of mat while PTA providing min resistance while pt cued to perform R knee flexion in order to increase HS activation. PTA also provided light feedback on  quadriceps to inhibit during movement prior to standing NMRE.  - Pt sitting edge of mat with CGA and with noted improvement in midline orientation (decrease L lean this session). Pt cued to adjust self back to center when noticing L lean increase with pt doing so with CGA. Pt participated in static stance with PTA on L with pt's arm over PTA shoulder, and PTA providing L knee block. Pt provided with tactile stimulation of R knee flexors to decrease R knee extensor activation per pushing to L presentation. Pt maxA + 2 throughout. Rehab tech cued to place knee behind pt's knee as compensatory strategy. Pt required seated rest breaks. Heel wedge placed in R shoe in order to shorten gastrocnemius. Pt also maxA to weight shift to L while PTA maintained knee in extension with tech cued to provide tactile stimulation to R knee flexors.  NMR performed for improvements in motor control and coordination, balance, sequencing, judgement, and self confidence/ efficacy in performing all aspects of mobility at highest level of independence.   Manual Therapy: Palpation of R gastrocnemius performed with tension noted. Education and rationale provided with pt agreeing to participate in intervention. - Soft tissue mobilization performed to inhibit excessive activation 2/2 pushing presentation.  Patient sitting in TIS at end of session with brakes locked, family present, belt alarm set, and all needs within reach.  SESSION 2 Skilled Therapeutic Interventions/Progress Updates: Patient sitting in TIS with daughter present on entrance to room. Patient alert and agreeable to PT session.  Patient reported no pain, only fatigue. Pt difficult to maintain arousal throughout session. PTA discussed about performing e-stim and provided education/safety with pt willing to try. Pt transported to main gym in TIS dependently. PTA donned e-stim pads to L VMO and rectus femoris. E-stim set on Atrophy L - intensity up to 26 with palpable  muscle contraction. Pt cued to actively look at LE as it moves through cycle (max cuing due to pt's decreased arousal). Pt did not report burning, but was unable to complete due to uncomfortability being too intense (PTA required increased time to place pads in different areas on quadriceps with some relief, but pt still reporting uncomfortability and requested to stop e-stim intervention). Pt noted to have some activation of L knee extension following with pt cued to place R UE on quadriceps (max multimodal cuing required as pt would move UE over to R knee vs L). Pt attempted sit<>stand to STEDY but required increased time/effort to perform as pt presented with decreased motor planning/attention to task to push with LE on STEDY pad, and to push off TIS arm rest vs STEDY bar due to pt's push bias vs pull. Pt stood briefly with maxA+2 and sat back down due to push increase on R UE. Pt transported back to room.  Patient sitting in TIS at end of session with brakes locked, daughter present, belt alarm set, and all needs within reach.       Therapy Documentation Precautions:  Precautions Precautions: Fall Recall of Precautions/Restrictions: Impaired Restrictions Weight Bearing Restrictions Per Provider Order: No  Therapy/Group: Individual Therapy  Arlanda Shiplett PTA 05/13/2024, 12:43 PM

## 2024-05-13 NOTE — Consult Note (Signed)
 Neuropsychological Consultation Comprehensive Inpatient Rehab   Patient:   [name]   DOB:   31-Oct-1965  MR Number:  [id number]  Location:  [facility] [address] CENTER [address] [place] [address] Dept: [phone] Loc: [phone]           Date of Service:   05/13/2024  Start Time:   8 AM End Time:   9 AM  Provider/Observer:  Norleen Asa, Psy.D.       Clinical Neuropsychologist       Billing Code/Service: [phone]  Reason for Service:    [name] is a 58 year old female referral for neuropsychological consultation for coping and adjustment issues related to extended hospitalization following a recent cerebrovascular event.  History of Present Illness: Admitted to [facility] on 05/05/2024 via EMS as a code stroke after acute onset of left-sided weakness at work. Initial head CT was negative. CTA revealed an abrupt occlusion of the right M1 segment with plaque and proximal right ICA stenosis, which had progressed since 2019 imaging. Neurology consulted, and patient underwent mechanical thrombectomy with TICI 2B revascularization. Stroke etiology determined to be embolic, likely related to atrial fibrillation. Patient was reportedly non-compliant with Xarelto . Subsequent MRI showed acute right MCA territory infarcts with hemorrhagic transformation. Initial symptoms included dysphagia, with evaluation by speech therapy. Admitted to the comprehensive inpatient rehabilitation unit for decreased functional mobility.  Past Medical History: - Hypertension - Type 2 Diabetes Mellitus - Right posterior MCA stroke (2019) - Right temporoparietal lobe stroke (2017) - Atrial Fibrillation (on Xarelto )  Social History: Previously independent with all ADLs. 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 Both jobs are  physically demanding. Drove and lived in a single-level home with her daughter. Reports family history of stroke (grandmothers) and heart conditions. Father was on Xarelto . Mother is also on Xarelto  and is reportedly adherent without significant bleeding issues.  Subjective/Behavioral Observations: Oriented to person, year (2025), and place ([place], [place] , [facility]) with some prompting and cueing. Initially disoriented to location.  Difficulties with problem-solving and conceptualization noted consistent with location neurologically of cerebrovascular.  Patient also notes deficits with visual-spatial processing as well.  Aware of month (August) and upcoming birthday in September. Patient reports non-adherence with Xarelto  due to fear of bleeding and bruising, particularly given her physical job and need for dental work. Describes easy bruising and prolonged bleeding from minor injuries (e.g., bumps at work) and gum bleeding, which is distressing. Reports prior experiences with Coumadin  and aspirin  with poor tolerance or side effects. Reports two falls within two weeks prior to the stroke, with a sense that something wasn't right and balance was totally throwed off, suggesting possible TIAs.  Medical History:   Past Medical History:  Diagnosis Date   Clotting disorder (HCC)    on xarelto    Essential hypertension    Heart murmur    a. 10/2015 Echo: EF 60-65%, no rwma, mild AI/MR, sev dil LA/RA, PASP .   Noncompliance    NSVT (nonsustained ventricular tachycardia) (HCC)    a. 10/2015 during admission for CVA/AF.   Persistent atrial fibrillation (HCC)    a. CHA2DS2VASC = 4-->Xarelto ;  b. 02/2016 Successful DCCV.  c. Recurrent fib   Pneumonia 2012 x 2; 2015   Stroke [facility])    a. 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   Transient ischemic attack (TIA) 01/2013         Patient Active  Problem List   Diagnosis Date Noted   Coping style affecting medical condition 05/13/2024   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 2 without complication (HCC) 05/11/2024   Dysphagia due to recent stroke 05/11/2024   Acute right ACA stroke (HCC) 05/09/2024   Acute ischemic right MCA stroke (HCC) 05/05/2024   Stroke (cerebrum) (HCC) 05/05/2024   Type 2 diabetes mellitus without complication, without long-term current use of insulin  (HCC) 06/17/2021   Coronary artery disease involving native coronary artery of native heart without angina pectoris 04/10/2021   Chronic combined systolic and diastolic CHF (congestive heart failure) (HCC) 04/01/2021   Longstanding persistent atrial fibrillation (HCC) 04/16/2020   Secondary hypercoagulable state (HCC) 04/16/2020   Edema leg 05/17/2019   Severe obesity (BMI 35.0-39.9) with comorbidity (HCC) 04/04/2019   Prediabetes 04/04/2019   Seasonal allergic rhinitis due to pollen 12/14/2018   Chronic back pain 08/23/2018   OSA on CPAP 06/23/2018   Headache 01/29/2017   Refractive errors 04/15/2016   Medical non-compliance 11/06/2015   Hyperlipidemia LDL goal <70 11/06/2015   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   NSVT (nonsustained ventricular tachycardia) (HCC) 11/06/2015   History of CVA (cerebrovascular accident)    Claudication of both lower extremities (HCC) 03/22/2015   Chronic atrial fibrillation (HCC) 04/12/2014   Pap smear for cervical cancer screening 03/28/2014   Cigarette smoker 06/15/2013   Ventral hernia 06/15/2013   Chronic anticoagulation 02/28/2013   Hx-TIA (transient ischemic attack) 02/05/13 02/15/2013   Essential hypertension 02/15/2013    Consultation Content: Psychoeducation was provided regarding the etiology of her embolic strokes, specifically the role of atrial fibrillation and the mechanism of anticoagulants like Xarelto . The risks and benefits of anticoagulation were discussed, framing the choice  as managing bleeding side effects versus preventing further, potentially more debilitating, strokes. Explained that her current presentation is a common reason for admission to the inpatient rehabilitation unit. Discussed the role of the multidisciplinary rehabilitation team (physiatry, PT, OT, speech therapy) in her recovery. Emphasized that her presence on the unit indicates an expectation for functional improvement. Explained that the inpatient setting allows for close monitoring and aggressive management of medications and medical issues to optimize recovery. Provided education on the typical recovery trajectory after a stroke, noting the window of recovery is approximately one year, with the most significant gains occurring in the first six months. The importance of the current period for maximizing neuro-recovery was stressed. Explained the neuropsychological effects of her right MCA stroke, including left-sided motor and sensory changes and deficits in visuospatial processing and synthesis (putting the puzzle pieces together), which contribute to her disorientation and difficulty organizing information. Reassurance was provided that these cognitive deficits are expected to improve with time and rehabilitation. Reinforced the quality of care at the current facility, noting its specialization in stroke care and the availability of advanced interventions like the mechanical thrombectomy she received.  Assessment: Patient demonstrates some awareness of her medical condition but has significant difficulty integrating complex medical information. Her non-adherence to anticoagulation appears rooted in fear of bleeding complications and a lack of understanding of the high risk of recurrent stroke. Mood and affect appear appropriate to the situation. Shows signs of visuospatial and executive dysfunction consistent with right MCA stroke, including initial disorientation and difficulty with problem-solving.  Patient is motivated for recovery, referencing her grandchildren as a reason to work hard.  Plan/Recommendations: 1. Continue participation in comprehensive inpatient rehabilitation, including PT, OT, and speech therapy. 2. Continue neuropsychological support to address coping, adjustment, and psychoeducation regarding stroke recovery and secondary prevention. 3. Patient was encouraged to work hard in all therapies and to communicate concerns with her treatment team (Drs. Swartz/Kirsteins). 4. Medical team to continue managing anticoagulation and other medical issues. Discussed that alternative anticoagulants could be explored if adherence remains an issue, but emphasized the critical need for stroke prevention. 5. Follow-up appointments with neurology and cardiology to be arranged prior to discharge.  Diagnosis:    Coping style affecting medical status         Electronically Signed   _______________________ Norleen Asa, Psy.D. Clinical Neuropsychologist

## 2024-05-13 NOTE — Plan of Care (Signed)
  Problem: Consults Goal: RH STROKE PATIENT EDUCATION Description: See Patient Education module for education specifics  Outcome: Progressing Goal: Nutrition Consult-if indicated Outcome: Progressing Goal: Diabetes Guidelines if Diabetic/Glucose > 140 Description: If diabetic or lab glucose is > 140 mg/dl - Initiate Diabetes/Hyperglycemia Guidelines & Document Interventions  Outcome: Progressing   Problem: RH BOWEL ELIMINATION Goal: RH STG MANAGE BOWEL WITH ASSISTANCE Description: STG Manage Bowel with mod I Assistance. Outcome: Progressing Goal: RH STG MANAGE BOWEL W/MEDICATION W/ASSISTANCE Description: STG Manage Bowel with Medication with mod I Assistance. Outcome: Progressing   Problem: RH SAFETY Goal: RH STG ADHERE TO SAFETY PRECAUTIONS W/ASSISTANCE/DEVICE Description: STG Adhere to Safety Precautions With cues Assistance/Device. Outcome: Progressing   Problem: RH KNOWLEDGE DEFICIT Goal: RH STG INCREASE KNOWLEDGE OF DIABETES Description: Patient and family will be able to manage DM using educational resources for medications and dietary modification independently Outcome: Progressing Goal: RH STG INCREASE KNOWLEDGE OF HYPERTENSION Description: Patient and family will be able to manage HTN using educational resources for medications and dietary modification independently Outcome: Progressing Goal: RH STG INCREASE KNOWLEDGE OF DYSPHAGIA/FLUID INTAKE Description: Patient and family will be able to manage Dysphagia using educational resources for medications and dietary modification independently Outcome: Progressing Goal: RH STG INCREASE KNOWLEGDE OF HYPERLIPIDEMIA Description: Patient and family will be able to manage HLD using educational resources for medications and dietary modification independently Outcome: Progressing Goal: RH STG INCREASE KNOWLEDGE OF STROKE PROPHYLAXIS Description: Patient and family will be able to manage secondary risks using educational resources  for medications and dietary modification independently Outcome: Progressing   Problem: Education: Goal: Ability to describe self-care measures that may prevent or decrease complications (Diabetes Survival Skills Education) will improve Outcome: Progressing Goal: Individualized Educational Video(s) Outcome: Progressing   Problem: Coping: Goal: Ability to adjust to condition or change in health will improve Outcome: Progressing   Problem: Fluid Volume: Goal: Ability to maintain a balanced intake and output will improve Outcome: Progressing   Problem: Health Behavior/Discharge Planning: Goal: Ability to identify and utilize available resources and services will improve Outcome: Progressing Goal: Ability to manage health-related needs will improve Outcome: Progressing   Problem: Metabolic: Goal: Ability to maintain appropriate glucose levels will improve Outcome: Progressing   Problem: Nutritional: Goal: Maintenance of adequate nutrition will improve Outcome: Progressing Goal: Progress toward achieving an optimal weight will improve Outcome: Progressing   Problem: Skin Integrity: Goal: Risk for impaired skin integrity will decrease Outcome: Progressing   Problem: Tissue Perfusion: Goal: Adequacy of tissue perfusion will improve Outcome: Progressing

## 2024-05-13 NOTE — Progress Notes (Signed)
 PROGRESS NOTE   Subjective/Complaints: No problems over night. Pt had early morning session with neuropsych. No complaints. Anxious to get home. Still a little more drowsy.   ROS: Limited due to cognitive/behavioral    Objective:   No results found. Recent Labs    05/12/24 0500  WBC 12.3*  HGB 14.4  HCT 44.5  PLT 231   No results for input(s): NA, K, CL, CO2, GLUCOSE, BUN, CREATININE, CALCIUM  in the last 72 hours.   Intake/Output Summary (Last 24 hours) at 05/13/2024 0955 Last data filed at 05/12/2024 1800 Gross per 24 hour  Intake 354 ml  Output --  Net 354 ml        Physical Exam: Vital Signs Blood pressure (!) 143/97, pulse 77, temperature 98.2 F (36.8 C), resp. rate 16, height 5' 3.5 (1.613 m), weight 88.6 kg, SpO2 97%.  Constitutional: No distress . Vital signs reviewed. HEENT: NCAT, EOMI, oral membranes moist Neck: supple Cardiovascular: RRR without murmur. No JVD    Respiratory/Chest: CTA Bilaterally without wheezes or rales. Normal effort    GI/Abdomen: BS +, non-tender, non-distended Ext: no clubbing, cyanosis, or edema Psych: pleasant and cooperative  Skin: Clean and intact without signs of breakdown Neuro:  pt is alert. Right gaze preference ongoing but can be cued to left. Follows basic commands. Soft voice, speech sl dysarthric, left central VII. MMT:LUE 0/5. ?perhaps trace HE LLE but otherwise 0/5. Moves right side freely. Senses pain and light touch in all 4's. LUE DTR's 1+. LLE DTR's brisk 3+ with early extensor tone, toes up.  Prior neuro assessment is c/w 05/13/2024 exam.  Musculoskeletal: no pain with attempts at AROM/PROM    Assessment/Plan: 1. Functional deficits which require 3+ hours per day of interdisciplinary therapy in a comprehensive inpatient rehab setting. Physiatrist is providing close team supervision and 24 hour management of active medical problems listed  below. Physiatrist and rehab team continue to assess barriers to discharge/monitor patient progress toward functional and medical goals  Care Tool:  Bathing    Body parts bathed by patient: Abdomen, Front perineal area, Left arm, Face, Chest   Body parts bathed by helper: Right arm, Buttocks, Right upper leg, Left upper leg, Right lower leg, Left lower leg     Bathing assist Assist Level: Maximal Assistance - Patient 24 - 49%     Upper Body Dressing/Undressing Upper body dressing   What is the patient wearing?: Pull over shirt    Upper body assist Assist Level: Maximal Assistance - Patient 25 - 49%    Lower Body Dressing/Undressing Lower body dressing      What is the patient wearing?: Incontinence brief, Pants     Lower body assist Assist for lower body dressing: Total Assistance - Patient < 25%     Toileting Toileting    Toileting assist Assist for toileting: 2 Helpers     Transfers Chair/bed transfer  Transfers assist  Chair/bed transfer activity did not occur: Safety/medical concerns  Chair/bed transfer assist level: 2 Helpers (MAX A +2 squat pivot)     Locomotion Ambulation   Ambulation assist   Ambulation activity did not occur: Safety/medical concerns  Walk 10 feet activity   Assist  Walk 10 feet activity did not occur: Safety/medical concerns        Walk 50 feet activity   Assist Walk 50 feet with 2 turns activity did not occur: Safety/medical concerns         Walk 150 feet activity   Assist Walk 150 feet activity did not occur: Safety/medical concerns         Walk 10 feet on uneven surface  activity   Assist Walk 10 feet on uneven surfaces activity did not occur: Safety/medical concerns         Wheelchair     Assist Is the patient using a wheelchair?: Yes Type of Wheelchair: Manual    Wheelchair assist level: Dependent - Patient 0% Max wheelchair distance: 150    Wheelchair 50 feet with 2  turns activity    Assist        Assist Level: Dependent - Patient 0%   Wheelchair 150 feet activity     Assist      Assist Level: Dependent - Patient 0%   Blood pressure (!) 143/97, pulse 77, temperature 98.2 F (36.8 C), resp. rate 16, height 5' 3.5 (1.613 m), weight 88.6 kg, SpO2 97%.  Medical Problem List and Plan: 1. 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 Pt is s/p IR w TICI2b. 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  Antithrombotics: -DVT/anticoagulation:  Pharmaceutical: Xarelto              -antiplatelet therapy: N/A 3. Pain Management: Tylenol  prn for pain              --Has been on topamax  for HA in the past.   -pain seems controlled for the most part. Would like to stay with tylenol  if possible given arousal 4. Mood/Behavior/Sleep: LCSW to follow for evaluaton and support.              --trazodone  prn for insomnia.              -antipsychotic agents: N/A 5. Neuropsych/cognition: This patient is capable of making decisions on her own behalf.  8/29--will begin a trial of amantadine  to help with arousal.   -she's not on any meds which should be contributing   -I see nothing from an ID standpoint either. She's had a leukocytosis even prior to coming over here. Counts have not changed. (See #14) 6. Skin/Wound Care: Routine pressure relief measures.  7. Fluids/Electrolytes/Nutrition: Monitor I/O. Add CM restrictions to diet             -Post stroke Dysphagia --SLP following. D2 diet/thins            -encourage PO, protein supp for low albumin  8. A fib: Monitor HR TID-- Metoprolol  succinate 200 mg daily  --Continue Xarelto .  9. HTN: Monitor BP TID. On metoprolol  and aldactone   -bp borderline currently--obsv today 10. NICM w/HFrEF:  Discussed with Cardiology Dr. Maceo. Digoxin  and metoprolol  tartrate was stooped. Entresto  not continued. She is continued on spironolactone  and Metoprolol  succinate 200mg  daily in the AM. Trying to keep regimen simple. Appreciate the  follow up 11.  Pre-diabetes: had been On Farxiga . Added CM restrictions to diet.  --Monitor BS ac/hs and use SSI tor elevated BS.  CBG (last 3)  Recent Labs    05/12/24 1651 05/12/24 2056 05/13/24 0601  GLUCAP 104* 92 102*   8/29 still controlled. Changed checks to qam only 12. HLD- LDL 109 crestor  40 mg daily  13. Obesity:  BMI 34.84 educate on diet and weight loss to promote overall health and mobility.  14. Leukocytosis: no s/s infection WBC 14.2--13.9--11.9   -8/28 wbc holding at 12.8, f/u Monday    -see #5  15. Slow trans constipation:   -miralax  scheduled with prns  -last bm 8/28  LOS: 4 days A FACE TO FACE EVALUATION WAS PERFORMED  [name] 05/13/2024, 9:55 AM

## 2024-05-14 DIAGNOSIS — I63521 Cerebral infarction due to unspecified occlusion or stenosis of right anterior cerebral artery: Secondary | ICD-10-CM | POA: Diagnosis not present

## 2024-05-14 LAB — GLUCOSE, CAPILLARY: Glucose-Capillary: 109 mg/dL — ABNORMAL HIGH (ref 70–99)

## 2024-05-14 NOTE — Progress Notes (Signed)
 PROGRESS NOTE   Subjective/Complaints:  Sleeping through lunch  Have noted amantadine  trial just started   ROS: Limited due to cognitive/behavioral    Objective:   No results found. Recent Labs    05/12/24 0500  WBC 12.3*  HGB 14.4  HCT 44.5  PLT 231   No results for input(s): NA, K, CL, CO2, GLUCOSE, BUN, CREATININE, CALCIUM  in the last 72 hours.   Intake/Output Summary (Last 24 hours) at 05/14/2024 1308 Last data filed at 05/13/2024 1930 Gross per 24 hour  Intake 238 ml  Output --  Net 238 ml        Physical Exam: Vital Signs Blood pressure 113/79, pulse 84, temperature 97.9 F (36.6 C), resp. rate 18, height 5' 3.5 (1.613 m), weight 88.6 kg, SpO2 100%.   General: No acute distress Mood and affect are appropriate Heart: Regular rate and rhythm no rubs murmurs or extra sounds Lungs: Clear to auscultation, breathing unlabored, no rales or wheezes Abdomen: Positive bowel sounds, soft nontender to palpation, nondistended Extremities: No clubbing, cyanosis, or edema Skin: No evidence of breakdown, no evidence of rash Neurologic: Cranial nerves II through XII intact, motor strength is 5/5 in bilateral deltoid, bicep, tricep, grip, hip flexor, knee extensors, ankle dorsiflexor and plantar flexor Sensory exam normal sensation to light touch in bilateral upper and lower extremities  Neuro:  pt is alert. Right gaze preference ongoing but can be cued to left. Follows basic commands. Soft voice, speech sl dysarthric, left central VII. MMT:LUE 0/5. ?perhaps trace HE LLE but otherwise 0/5. Moves right side freely. Senses pain and light touch in all 4's. LUE DTR's 1+. LLE DTR's brisk 3+ with early extensor tone, toes up.  Prior neuro assessment is c/w 05/14/2024 exam.  Musculoskeletal: no pain with attempts at AROM/PROM    Assessment/Plan: 1. Functional deficits which require 3+ hours per day of  interdisciplinary therapy in a comprehensive inpatient rehab setting. Physiatrist is providing close team supervision and 24 hour management of active medical problems listed below. Physiatrist and rehab team continue to assess barriers to discharge/monitor patient progress toward functional and medical goals  Care Tool:  Bathing    Body parts bathed by patient: Abdomen, Front perineal area, Left arm, Face, Chest   Body parts bathed by helper: Right arm, Buttocks, Right upper leg, Left upper leg, Right lower leg, Left lower leg     Bathing assist Assist Level: Maximal Assistance - Patient 24 - 49%     Upper Body Dressing/Undressing Upper body dressing   What is the patient wearing?: Pull over shirt    Upper body assist Assist Level: Maximal Assistance - Patient 25 - 49%    Lower Body Dressing/Undressing Lower body dressing      What is the patient wearing?: Incontinence brief, Pants     Lower body assist Assist for lower body dressing: Total Assistance - Patient < 25%     Toileting Toileting    Toileting assist Assist for toileting: 2 Helpers     Transfers Chair/bed transfer  Transfers assist  Chair/bed transfer activity did not occur: Safety/medical concerns  Chair/bed transfer assist level: 2 Helpers (MAX A +2 squat pivot)  Locomotion Ambulation   Ambulation assist   Ambulation activity did not occur: Safety/medical concerns          Walk 10 feet activity   Assist  Walk 10 feet activity did not occur: Safety/medical concerns        Walk 50 feet activity   Assist Walk 50 feet with 2 turns activity did not occur: Safety/medical concerns         Walk 150 feet activity   Assist Walk 150 feet activity did not occur: Safety/medical concerns         Walk 10 feet on uneven surface  activity   Assist Walk 10 feet on uneven surfaces activity did not occur: Safety/medical concerns         Wheelchair     Assist Is the  patient using a wheelchair?: Yes Type of Wheelchair: Manual    Wheelchair assist level: Dependent - Patient 0% Max wheelchair distance: 150    Wheelchair 50 feet with 2 turns activity    Assist        Assist Level: Dependent - Patient 0%   Wheelchair 150 feet activity     Assist      Assist Level: Dependent - Patient 0%   Blood pressure 113/79, pulse 84, temperature 97.9 F (36.6 C), resp. rate 18, height 5' 3.5 (1.613 m), weight 88.6 kg, SpO2 100%.  Medical Problem List and Plan: 1. 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 Pt is s/p IR w TICI2b. 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  Antithrombotics: -DVT/anticoagulation:  Pharmaceutical: Xarelto              -antiplatelet therapy: N/A 3. Pain Management: Tylenol  prn for pain              --Has been on topamax  for HA in the past.   -pain seems controlled for the most part. Would like to stay with tylenol  if possible given arousal 4. Mood/Behavior/Sleep: LCSW to follow for evaluaton and support.              --trazodone  prn for insomnia.              -antipsychotic agents: N/A 5. Neuropsych/cognition: This patient is capable of making decisions on her own behalf.  8/29--will begin a trial of amantadine  to help with arousal.   Consider methylphenidate if amantadine  is not helpful  6. Skin/Wound Care: Routine pressure relief measures.  7. Fluids/Electrolytes/Nutrition: Monitor I/O. Add CM restrictions to diet             -Post stroke Dysphagia --SLP following. D2 diet/thins            -encourage PO, protein supp for low albumin  8. A fib: Monitor HR TID-- Metoprolol  succinate 200 mg daily  --Continue Xarelto .  9. HTN: Monitor BP TID. On metoprolol  and aldactone  Vitals:   05/13/24 1937  05/14/24 0512  BP: 116/74 113/79  Pulse: 65 84  Resp: 18 18  Temp: 98 F (36.7 C) 97.9 F (36.6 C)  SpO2: 99% 100%    10. NICM w/HFrEF: Discussed with Cardiology Dr. Maceo. Digoxin  and  metoprolol  tartrate was stooped. Entresto  not continued. She is continued on spironolactone  and Metoprolol  succinate 200mg  daily in the AM. Trying to keep regimen simple. Appreciate the follow up 11.  Pre-diabetes: had been On Farxiga . Added CM restrictions to diet.  --Monitor BS ac/hs and use SSI tor elevated BS.  CBG (last 3)  Recent Labs    05/13/24 1645 05/13/24 2109 05/14/24 0623  GLUCAP 102* 130* 109*  Controlled 8/30 12. HLD- LDL 109 crestor  40 mg daily  13. Obesity:  BMI 34.84 educate on diet and weight loss to promote overall health and mobility.  14. Leukocytosis: no s/s infection WBC 14.2--13.9--11.9   -8/28 wbc holding at 12.8, f/u Monday    -see #5  15. Slow trans constipation:   -miralax  scheduled with prns  -last bm 8/28  LOS: 5 days A FACE TO FACE EVALUATION WAS PERFORMED  Prentice FORBES Compton 05/14/2024, 1:08 PM

## 2024-05-14 NOTE — Plan of Care (Signed)
  Problem: Consults Goal: RH STROKE PATIENT EDUCATION Description: See Patient Education module for education specifics  Outcome: Progressing Goal: Nutrition Consult-if indicated Outcome: Progressing Goal: Diabetes Guidelines if Diabetic/Glucose > 140 Description: If diabetic or lab glucose is > 140 mg/dl - Initiate Diabetes/Hyperglycemia Guidelines & Document Interventions  Outcome: Progressing   Problem: RH BOWEL ELIMINATION Goal: RH STG MANAGE BOWEL WITH ASSISTANCE Description: STG Manage Bowel with mod I Assistance. Outcome: Progressing Goal: RH STG MANAGE BOWEL W/MEDICATION W/ASSISTANCE Description: STG Manage Bowel with Medication with mod I Assistance. Outcome: Progressing   Problem: RH SAFETY Goal: RH STG ADHERE TO SAFETY PRECAUTIONS W/ASSISTANCE/DEVICE Description: STG Adhere to Safety Precautions With cues Assistance/Device. Outcome: Progressing   Problem: RH KNOWLEDGE DEFICIT Goal: RH STG INCREASE KNOWLEDGE OF DIABETES Description: Patient and family will be able to manage DM using educational resources for medications and dietary modification independently Outcome: Progressing Goal: RH STG INCREASE KNOWLEDGE OF HYPERTENSION Description: Patient and family will be able to manage HTN using educational resources for medications and dietary modification independently Outcome: Progressing Goal: RH STG INCREASE KNOWLEDGE OF DYSPHAGIA/FLUID INTAKE Description: Patient and family will be able to manage Dysphagia using educational resources for medications and dietary modification independently Outcome: Progressing Goal: RH STG INCREASE KNOWLEGDE OF HYPERLIPIDEMIA Description: Patient and family will be able to manage HLD using educational resources for medications and dietary modification independently Outcome: Progressing Goal: RH STG INCREASE KNOWLEDGE OF STROKE PROPHYLAXIS Description: Patient and family will be able to manage secondary risks using educational resources  for medications and dietary modification independently Outcome: Progressing

## 2024-05-14 NOTE — Progress Notes (Signed)
 Chart reviewed - no new recommendation.. Please call if there are problems over the weekend.

## 2024-05-15 DIAGNOSIS — I63521 Cerebral infarction due to unspecified occlusion or stenosis of right anterior cerebral artery: Secondary | ICD-10-CM | POA: Diagnosis not present

## 2024-05-15 LAB — GLUCOSE, CAPILLARY: Glucose-Capillary: 100 mg/dL — ABNORMAL HIGH (ref 70–99)

## 2024-05-15 NOTE — Progress Notes (Signed)
 Occupational Therapy Session Note  Patient Details  Name: [name] MRN: [id number] Date of Birth: 10/22/1965  Today's Date: 05/15/2024 OT Individual Time: 9262-9150 OT Individual Time Calculation (min): 72 min    Short Term Goals: Week 1:  OT Short Term Goal 1 (Week 1): Pt will maintain static sit EOB with CGA to supervision to engage in UB self care safely. OT Short Term Goal 2 (Week 1): Pt will complete squat pivot transfers from bed >< w/c with mod A or less with 1 person to work towards a safe toilet transfers. OT Short Term Goal 3 (Week 1): Pt will demonstrate improved L arm awareness to attend to arm during bed mobility tasks with min cues. OT Short Term Goal 4 (Week 1): Pt will be able to don pull over shirt with mod A.  Skilled Therapeutic Interventions/Progress Updates:  Pt greeted supine in bed, pt agreeable to OT intervention.      Transfers/bed mobility/functional mobility:  Pt completed supine>sit with MODA +2, pt able to roll to L side of bed with MIN A. Education provided on hooking method of BLEs using RLE to hook LLE and slide both to EOB, good carryover with this strategy. Pt then required MODA +2 to elevate trunk into sitting.  Pt completed squat pivot transfer to R side with MAX A +2 using small incremental scoots. Pt needed MAX step by step cues to sequence and set- up body mechanics and technique.    ADLs:  Grooming: pt completed oral care at sink with pt able to recall hemi technique with overall set- up assist.  UB dressing:pt donned [place] shirt with MODA needing cue to recall hemi technique  LB dressing: pt donned pants and brief from bed level with MAX A using a combination of rolling and bridging  Footwear: donned socks with total A from bed level with total A  Bathing: completed bathing from sitting in TIS at sink and from bed level for LB. Pt able to roll to L side and use RUE to wash back side with set-up assist. Pt bathed from hips>knee from upright  position in bed with a focus on L sided attention and directing level of care. At sink pt able to wash LUE, and stomach with set- up assist, emphasis on providing sensory reeducation to LUE  Of note set- up pt for NMES to L wrist flexors however pt did not tolerate the sensation this morning but was open to trying NMES another session.                 Ended session with pt seated in TIS with all needs within reach and safety belt alarm activated.                    Therapy Documentation Precautions:  Precautions Precautions: Fall Recall of Precautions/Restrictions: Impaired Restrictions Weight Bearing Restrictions Per Provider Order: No  Pain: No pain    Therapy/Group: Individual Therapy  Ronal Gift [facility] 05/15/2024, 12:03 PM

## 2024-05-15 NOTE — Plan of Care (Signed)
  Problem: Consults Goal: RH STROKE PATIENT EDUCATION Description: See Patient Education module for education specifics  Outcome: Progressing Goal: Nutrition Consult-if indicated Outcome: Progressing Goal: Diabetes Guidelines if Diabetic/Glucose > 140 Description: If diabetic or lab glucose is > 140 mg/dl - Initiate Diabetes/Hyperglycemia Guidelines & Document Interventions  Outcome: Progressing   Problem: RH BOWEL ELIMINATION Goal: RH STG MANAGE BOWEL WITH ASSISTANCE Description: STG Manage Bowel with mod I Assistance. Outcome: Progressing Goal: RH STG MANAGE BOWEL W/MEDICATION W/ASSISTANCE Description: STG Manage Bowel with Medication with mod I Assistance. Outcome: Progressing   Problem: RH SAFETY Goal: RH STG ADHERE TO SAFETY PRECAUTIONS W/ASSISTANCE/DEVICE Description: STG Adhere to Safety Precautions With cues Assistance/Device. Outcome: Progressing   Problem: RH KNOWLEDGE DEFICIT Goal: RH STG INCREASE KNOWLEDGE OF DIABETES Description: Patient and family will be able to manage DM using educational resources for medications and dietary modification independently Outcome: Progressing Goal: RH STG INCREASE KNOWLEDGE OF HYPERTENSION Description: Patient and family will be able to manage HTN using educational resources for medications and dietary modification independently Outcome: Progressing Goal: RH STG INCREASE KNOWLEDGE OF DYSPHAGIA/FLUID INTAKE Description: Patient and family will be able to manage Dysphagia using educational resources for medications and dietary modification independently Outcome: Progressing Goal: RH STG INCREASE KNOWLEGDE OF HYPERLIPIDEMIA Description: Patient and family will be able to manage HLD using educational resources for medications and dietary modification independently Outcome: Progressing Goal: RH STG INCREASE KNOWLEDGE OF STROKE PROPHYLAXIS Description: Patient and family will be able to manage secondary risks using educational resources  for medications and dietary modification independently Outcome: Progressing

## 2024-05-15 NOTE — Progress Notes (Signed)
 Physical Therapy Session Note  Patient Details  Name: [name] MRN: [id number] Date of Birth: [birth year]  Today's Date: 05/15/2024 PT Individual Time: 1400-1457 PT Individual Time Calculation (min): 57 min   Short Term Goals: Week 1:  PT Short Term Goal 1 (Week 1): Pt will transfer sup to sit w/ max A x 1. PT Short Term Goal 2 (Week 1): Pt will transfer bed <> w/c w/ max A x 1 consistently, SB or SPT. PT Short Term Goal 3 (Week 1): PT to assess gait. PT Short Term Goal 4 (Week 1): Pt to maintain seated balance w/o trunk support x 10' w/ min A.  Skilled Therapeutic Interventions/Progress Updates:      Pt in bed to start, sleeping but awakens to voice. Pt does not have reports of any pain.  Supine<>sitting EOB with maxA with ++ time for processing and initiation. Increased extensor tone in LLE inhibiting bed mobility. Able to sit EOB with CGA with limited pushing noted. Completed stand/squat pivot transfer towards her stronger R side at [facility] L L C level with +2 on standby.   Transported patient to main rehab hallway to focus remainder of session on NMR gait training using R hand rail and +2 assist for w/c follow.   Pt completed sit<>stand with R hand rail with min/modA. She benefited from verbal and tactile cueing for weight shifting R in standing. No knee buckling on LLE with extensor tone present. She was able to progress to gait training where she ambulated the length of the HR (33ft) x3 bouts with seated rest breaks. She ambulated with mod/maxA with +2 for wheelchair follow. She needed totalA for advancing LLE through gait cycle but no buckling due to ?tone. Progressed from step-to to reciprocal pattern of gait, continued cues for weight shifting and stepping cadence. Pt distractible from boards in hallways, needing cues for attenuation.   Pt returned to her room, was left sitting up in TIS w/c (reclind for safety) with her needs met. Family in room.   Therapy Documentation Precautions:   Precautions Precautions: Fall Recall of Precautions/Restrictions: Impaired Restrictions Weight Bearing Restrictions Per Provider Order: No General:      Therapy/Group: Individual Therapy  Sherlean SHAUNNA Perks 05/15/2024, 7:44 AM

## 2024-05-15 NOTE — Progress Notes (Signed)
 PROGRESS NOTE   Subjective/Complaints:  No issues overnite, had therapy this am  Daughters are at bedside   ROS: Limited due to cognitive/behavioral    Objective:   No results found. No results for input(s): WBC, HGB, HCT, PLT in the last 72 hours.  No results for input(s): NA, K, CL, CO2, GLUCOSE, BUN, CREATININE, CALCIUM  in the last 72 hours.   Intake/Output Summary (Last 24 hours) at 05/15/2024 1140 Last data filed at 05/14/2024 1820 Gross per 24 hour  Intake 220 ml  Output --  Net 220 ml        Physical Exam: Vital Signs Blood pressure 108/85, pulse 80, temperature 98.5 F (36.9 C), resp. rate 18, height 5' 3.5 (1.613 m), weight 88.6 kg, SpO2 100%.   General: No acute distress Mood and affect are appropriate Heart: Regular rate and rhythm no rubs murmurs or extra sounds Lungs: Clear to auscultation, breathing unlabored, no rales or wheezes Abdomen: Positive bowel sounds, soft nontender to palpation, nondistended Extremities: No clubbing, cyanosis, or edema Skin: No evidence of breakdown, no evidence of rash Neurologic: Cranial nerves II through XII intact, Sensory exam normal sensation to light touch in bilateral upper and lower extremities  Neuro:  pt is alert but keeps eyes closed . Right gaze preference ongoing but can be cued to left. Follows basic commands. Soft voice, speech sl dysarthric, left central VII. MMT:LUE 0/5. ?perhaps trace Left elbow flexion and Hip Ext LLE when ext placed in 90/90 position at hip and knee but otherwise 0/5. Moves right side freely. Senses pain and light touch in all 4's. LUE DTR's 1+. LLE DTR's brisk 3+ with early extensor tone, toes up.  Prior neuro assessment is c/w 05/15/2024 exam.  Musculoskeletal: no pain with AROM/PROM    Assessment/Plan: 1. Functional deficits which require 3+ hours per day of interdisciplinary therapy in a comprehensive  inpatient rehab setting. Physiatrist is providing close team supervision and 24 hour management of active medical problems listed below. Physiatrist and rehab team continue to assess barriers to discharge/monitor patient progress toward functional and medical goals  Care Tool:  Bathing    Body parts bathed by patient: Abdomen, Front perineal area, Left arm, Face, Chest   Body parts bathed by helper: Right arm, Buttocks, Right upper leg, Left upper leg, Right lower leg, Left lower leg     Bathing assist Assist Level: Maximal Assistance - Patient 24 - 49%     Upper Body Dressing/Undressing Upper body dressing   What is the patient wearing?: Pull over shirt    Upper body assist Assist Level: Maximal Assistance - Patient 25 - 49%    Lower Body Dressing/Undressing Lower body dressing      What is the patient wearing?: Incontinence brief, Pants     Lower body assist Assist for lower body dressing: Total Assistance - Patient < 25%     Toileting Toileting    Toileting assist Assist for toileting: 2 Helpers     Transfers Chair/bed transfer  Transfers assist  Chair/bed transfer activity did not occur: Safety/medical concerns  Chair/bed transfer assist level: 2 Helpers (MAX A +2 squat pivot)     Locomotion Ambulation  Ambulation assist   Ambulation activity did not occur: Safety/medical concerns          Walk 10 feet activity   Assist  Walk 10 feet activity did not occur: Safety/medical concerns        Walk 50 feet activity   Assist Walk 50 feet with 2 turns activity did not occur: Safety/medical concerns         Walk 150 feet activity   Assist Walk 150 feet activity did not occur: Safety/medical concerns         Walk 10 feet on uneven surface  activity   Assist Walk 10 feet on uneven surfaces activity did not occur: Safety/medical concerns         Wheelchair     Assist Is the patient using a wheelchair?: Yes Type of  Wheelchair: Manual    Wheelchair assist level: Dependent - Patient 0% Max wheelchair distance: 150    Wheelchair 50 feet with 2 turns activity    Assist        Assist Level: Dependent - Patient 0%   Wheelchair 150 feet activity     Assist      Assist Level: Dependent - Patient 0%   Blood pressure 108/85, pulse 80, temperature 98.5 F (36.9 C), resp. rate 18, height 5' 3.5 (1.613 m), weight 88.6 kg, SpO2 100%.  Medical Problem List and Plan: 1. 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 Pt is s/p IR w TICI2b. 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  Antithrombotics: -DVT/anticoagulation:  Pharmaceutical: Xarelto              -antiplatelet therapy: N/A 3. Pain Management: Tylenol  prn for pain              --Has been on topamax  for HA in the past.   -pain seems controlled for the most part. Would like to stay with tylenol  if possible given arousal 4. Mood/Behavior/Sleep: LCSW to follow for evaluaton and support.              --trazodone  prn for insomnia.              -antipsychotic agents: N/A 5. Neuropsych/cognition: This patient is capable of making decisions on her own behalf.  8/29--will begin a trial of amantadine  to help with arousal.   Consider methylphenidate if amantadine  is not helpful  6. Skin/Wound Care: Routine pressure relief measures.  7. Fluids/Electrolytes/Nutrition: Monitor I/O. Add CM restrictions to diet             -Post stroke Dysphagia --SLP following. D2 diet/thins            -encourage PO, protein supp for low albumin  8. A fib: Monitor HR TID-- Metoprolol  succinate 200 mg daily  --Continue Xarelto .  9. HTN: Monitor BP TID. On metoprolol  and aldactone  Vitals:   05/14/24 2003 05/15/24 0509  BP: 102/62 108/85  Pulse:  71 80  Resp: 18 18  Temp: 97.8 F (36.6 C) 98.5 F (36.9 C)  SpO2: 100% 100%    10. NICM w/HFrEF: Discussed with Cardiology Dr. Maceo. Digoxin  and metoprolol  tartrate was stooped.  Entresto  not continued. She is continued on spironolactone  and Metoprolol  succinate 200mg  daily in the AM. Trying to keep regimen simple. Appreciate the follow up 11.  Pre-diabetes: had been On Farxiga . Added CM restrictions to diet.  --Monitor BS ac/hs and use SSI tor elevated BS.  CBG (last 3)  Recent Labs    05/13/24 2109 05/14/24 0623 05/15/24 0626  GLUCAP 130* 109* 100*  Controlled 8/30-31 12. HLD- LDL 109 crestor  40 mg daily  13. Obesity:  BMI 34.84 educate on diet and weight loss to promote overall health and mobility.  14. Leukocytosis: no s/s infection WBC 14.2--13.9--11.9   -8/28 wbc holding at 12.8, f/u Monday    -see #5  15. Slow trans constipation:   -miralax  scheduled with prns  -last bm 8/28  LOS: 6 days A FACE TO FACE EVALUATION WAS PERFORMED  [name] 05/15/2024, 11:40 AM

## 2024-05-15 NOTE — Progress Notes (Signed)
 Speech Language Pathology Daily Session Note  Patient Details  Name: [name] MRN: [id number] Date of Birth: 25-May-1966  Today's Date: 05/15/2024 SLP Individual Time: 1004-1100 SLP Individual Time Calculation (min): 56 min  Short Term Goals: Week 1: SLP Short Term Goal 1 (Week 1): Pt will trial Dys 3 textures with SLP only demonstrating timely and functional oral phase of swallow provided min-mod A cues SLP Short Term Goal 2 (Week 1): Patient will utilize swallowing compensatory strategies to reduce s/sx of aspiration during consumption of PO given min multimodal A SLP Short Term Goal 3 (Week 1): Patient will attend to L side during functional tasks given mod multimodal A  Patient will attend to the left field during functional problem solving tasks in 4/5 opportunities given max multimodal cues. SLP Short Term Goal 4 (Week 1): Patient will participate in a formal cognitive assessment in order to determine potential cognitive intervention  Skilled Therapeutic Interventions:  Pt seen for ST targeting dysphagia and cognitive-linguistic goals. Pt reported 8/10 lower back pain; SLP informed NSG. SLP facilitated trial of transitional food, goldfish crackers, to assess for diet texture upgrade (pt declined proposed Dys 3 trials). Pt with very limited to no mastication and instead allowed them to soften before swallowing. Mild lingual residue noted that cleared with clinician-administered liquid wash. No overt s/sx of aspiration observed; however, pt is likely still unsafe for true regular textures given oral phase deficits and reduced awareness of her deficits.    SLP then facilitated functional tx tasks targeting L inattention. Pt denies visual cut but endorses occasional speckles in her vision. Pt simulated ordering from hospital menu when provided MAX verbal and visual cues to scan to L. She benefited from a paper guide to direct her attention but it was marginally successful. Reading aloud was  not successful as she did not notice her errors. SLP instead targeted cancellation task - pt initially with 16% acc IND, improving to 33% acc with a flashing red anchor, and 100% when provided an anchor and max verbal and visual cueing. Pt's daughter arrived near the end of the session and SLP provided education on strategies and purpose of working on L neglect. Pt left in chair with call bell in reach and visitor present. Continue ST POC.   Pain Pain Assessment Pain Scale: 0-10 Pain Score: 8  Pain Type: Acute pain Pain Location: Back Pain Orientation: Lower Pain Intervention(s): RN made aware  Therapy/Group: Individual Therapy  Waddell JONETTA Novak, MA CCC-SLP 05/15/2024, 4:03 PM

## 2024-05-16 DIAGNOSIS — D72829 Elevated white blood cell count, unspecified: Secondary | ICD-10-CM | POA: Diagnosis not present

## 2024-05-16 DIAGNOSIS — I10 Essential (primary) hypertension: Secondary | ICD-10-CM | POA: Diagnosis not present

## 2024-05-16 DIAGNOSIS — G8111 Spastic hemiplegia affecting right dominant side: Secondary | ICD-10-CM | POA: Diagnosis not present

## 2024-05-16 DIAGNOSIS — I63521 Cerebral infarction due to unspecified occlusion or stenosis of right anterior cerebral artery: Secondary | ICD-10-CM | POA: Diagnosis not present

## 2024-05-16 LAB — BASIC METABOLIC PANEL WITH GFR
Anion gap: 14 (ref 5–15)
BUN: 11 mg/dL (ref 6–20)
CO2: 21 mmol/L — ABNORMAL LOW (ref 22–32)
Calcium: 10 mg/dL (ref 8.9–10.3)
Chloride: 102 mmol/L (ref 98–111)
Creatinine, Ser: 1.1 mg/dL — ABNORMAL HIGH (ref 0.44–1.00)
GFR, Estimated: 58 mL/min — ABNORMAL LOW (ref >60)
Glucose, Bld: 113 mg/dL — ABNORMAL HIGH (ref 70–99)
Potassium: 4.3 mmol/L (ref 3.5–5.1)
Sodium: 137 mmol/L (ref 135–145)

## 2024-05-16 LAB — CBC
HCT: 44.7 % (ref 36.0–46.0)
Hemoglobin: 14.2 g/dL (ref 12.0–15.0)
MCH: 26.6 pg (ref 26.0–34.0)
MCHC: 31.8 g/dL (ref 30.0–36.0)
MCV: 83.9 fL (ref 80.0–100.0)
Platelets: 283 K/uL (ref 150–400)
RBC: 5.33 MIL/uL — ABNORMAL HIGH (ref 3.87–5.11)
RDW: 13.6 % (ref 11.5–15.5)
WBC: 13.6 K/uL — ABNORMAL HIGH (ref 4.0–10.5)
nRBC: 0 % (ref 0.0–0.2)

## 2024-05-16 LAB — GLUCOSE, CAPILLARY: Glucose-Capillary: 107 mg/dL — ABNORMAL HIGH (ref 70–99)

## 2024-05-16 NOTE — Progress Notes (Signed)
 Speech Language Pathology Daily Session Note  Patient Details  Name: [name] MRN: [id number] Date of Birth: 14-Mar-1966  Today's Date: 05/16/2024 SLP Individual Time: 9099-9041 SLP Individual Time Calculation (min): 58 min  Short Term Goals: Week 1: SLP Short Term Goal 1 (Week 1): Pt will trial Dys 3 textures with SLP only demonstrating timely and functional oral phase of swallow provided min-mod A cues SLP Short Term Goal 2 (Week 1): Patient will utilize swallowing compensatory strategies to reduce s/sx of aspiration during consumption of PO given min multimodal A SLP Short Term Goal 3 (Week 1): Patient will attend to L side during functional tasks given mod multimodal A  Patient will attend to the left field during functional problem solving tasks in 4/5 opportunities given max multimodal cues. SLP Short Term Goal 4 (Week 1): Patient will participate in a formal cognitive assessment in order to determine potential cognitive intervention  Skilled Therapeutic Interventions: Skilled therapy session focused on dysphagia and cognitive goals. SLP observed patient with breakfast tray consisting of D2/thin liquids. Patient with mashing and prolonged mastication of solids requiring consistent cues for use of liquid wash to clear oral residuals. Recommend downgrade to D1/thin liquids due to incomplete mastication and solids. No s/sx of aspiration observed. Patient should receive FULL supervision during mealtimes. SLP targeted cognition through orientation and attention task. Patient independently oriented to self, situation, location and todays holiday. Patient required modA to utilize calendar to location todays date. SLP continued to target L attention through sequencing of alphabet. Patient required maxA to complete task. Patient left in chair with alarm set and call bell in reach. Continue POC   Pain None reported to SLP  Therapy/Group: Individual Therapy  Carmelita Amparo M.A.,  CCC-SLP 05/16/2024, 7:38 AM

## 2024-05-16 NOTE — Progress Notes (Signed)
 Occupational Therapy Session Note  Patient Details  Name: ZAELYN NOACK MRN: [id number] Date of Birth: 1966-02-10  Today's Date: 05/16/2024 OT Individual Time: 9199-9140 OT Individual Time Calculation (min): 59 min    Short Term Goals: Week 1:  OT Short Term Goal 1 (Week 1): Pt will maintain static sit EOB with CGA to supervision to engage in UB self care safely. OT Short Term Goal 2 (Week 1): Pt will complete squat pivot transfers from bed >< w/c with mod A or less with 1 person to work towards a safe toilet transfers. OT Short Term Goal 3 (Week 1): Pt will demonstrate improved L arm awareness to attend to arm during bed mobility tasks with min cues. OT Short Term Goal 4 (Week 1): Pt will be able to don pull over shirt with mod A.  Skilled Therapeutic Interventions/Progress Updates:     Pt received deeply sleeping in bed, waking upon OT arrival. Pt presenting to be in good spirits receptive to skilled OT session reporting 0/10 pain- OT offering intermittent rest breaks, repositioning, and therapeutic support to optimize participation in therapy session. Focused this session on ADL retraining, L side attention, and body awareness. Pt with improved alertness levels, improved static sitting balance, and decreased pushing to L this session.   ADL:  -Dressing: Donned pants bed level using combination of bridges and rolling in bed. Pt able to roll to L with MOD A and to R with MAX A. Completed bridge with OT stabilizing L LE MIN A. Sitting EOB, donned [place] shirt with MOD A overall using hemi-techniques.  -Toileting: Pt completed 3/3 toileting tasks while seated on standard toilet. She stood in [employer] during Acupuncturist with MOD A +2 MIN A with consistent verbal/tactile cues required for midline attention, to decrease lateral pushing to L, and facilitate weight shifting to R. Anterior peri-care completed in sitting with MOD A required for dynamic sitting balance.  -Grooming/hygiene: Completed oral  care seated in position using hemi-techniques with education provided on utilizing L UE as a stabilizer. MIN A required for donning toothpaste and verbal cues for initiation and sequencing.   Mobility:  -Bed Mobility: Education provided on using posterior hooking method and log rolling with Pt able to complete MOD A +MOD verbal cues and increased time. HOB slightly elevated.  -STEDY: Educated Pt on use of STEDY and transfer technique with emphasis on hand positioning, safety precautions, and L UE positioning with Pt receptive to education. Pt able to complete sit<>stands using STEDY grab bar with MOD A +2 MIN A and maintain perched sitting position with MIN A overall +assistance required for L UE position.   Pt was left resting in TISWC with call bell in reach, seatbelt alarm on, and all needs met.    Therapy Documentation Precautions:  Precautions Precautions: Fall Recall of Precautions/Restrictions: Impaired Restrictions Weight Bearing Restrictions Per Provider Order: No    Therapy/Group: Individual Therapy  Katheryn SHAUNNA [place] 05/16/2024, 7:43 AM

## 2024-05-16 NOTE — Progress Notes (Signed)
 Physical Therapy Session Note  Patient Details  Name: [name] MRN: [id number] Date of Birth: 1966-09-07  Today's Date: 05/16/2024 PT Individual Time: 8954-8841 PT Individual Time Calculation (min): 73 min   Short Term Goals: Week 1:  PT Short Term Goal 1 (Week 1): Pt will transfer sup to sit w/ max A x 1. PT Short Term Goal 2 (Week 1): Pt will transfer bed <> w/c w/ max A x 1 consistently, SB or SPT. PT Short Term Goal 3 (Week 1): PT to assess gait. PT Short Term Goal 4 (Week 1): Pt to maintain seated balance w/o trunk support x 10' w/ min A.  Skilled Therapeutic Interventions/Progress Updates:      Pt sitting up in wheelchair, in agreement to therapy treatment. Continues to have a flat affect, has no complaints of pain. R gaze preference evident throughout session, encouraged L attention throughout mobility tasks.   Patient transported to main rehab hallway, placed along R hand rail. Pt completed sit<>stand using R hand rail with min/modA with assist primarily for initiation and balance in standing. No knee buckling on LLE, but does require positioning LE before standing due to her awareness deficits. She ambulated 20ft with modA, primarily for advancing LLE and for providing weight shifting towards her R side. Extensor tone in LLE inhibiting swing phase, very limited knee flexion in swing. Pt provided AFO (Thusane), limited options given size and L hemi. Donned with totalA and then she ambulated an additional 77ft with similar deficits as above. However; improved with forced foot clearance from bracing.   Pt reporting fatigue and requesting to defer further ambulation. Patient setup at w/c level for table top activity. She was instructed in completing the Ring Arc activity with a bias towards her L side. Worked on selective attention in gym environment, L attention with head turns, and sustained gaze to her L. Pt struggled with spatial orientation, sequencing, counting #rings, and  organization with task. Max cues needed overall for this activity.   Pt returned to her room at end of treatment, left reclined in TIS w/c with seat belt alarm on. All her needs met.   Therapy Documentation Precautions:  Precautions Precautions: Fall Recall of Precautions/Restrictions: Impaired Restrictions Weight Bearing Restrictions Per Provider Order: No General:     Therapy/Group: Individual Therapy  Sherlean SHAUNNA [place] 05/16/2024, 7:37 AM

## 2024-05-16 NOTE — Plan of Care (Signed)
?  Problem: RH BOWEL ELIMINATION ?Goal: RH STG MANAGE BOWEL WITH ASSISTANCE ?Description: STG Manage Bowel with mod I Assistance. ?Outcome: Progressing ?Goal: RH STG MANAGE BOWEL W/MEDICATION W/ASSISTANCE ?Description: STG Manage Bowel with Medication with mod I  Assistance. ?Outcome: Progressing ?  ?Problem: RH SAFETY ?Goal: RH STG ADHERE TO SAFETY PRECAUTIONS W/ASSISTANCE/DEVICE ?Description: STG Adhere to Safety Precautions With cues Assistance/Device. ?Outcome: Progressing ?  ?

## 2024-05-16 NOTE — Progress Notes (Signed)
 PROGRESS NOTE   Subjective/Complaints:  Up with therapy. Has mild headache off and on. No problems overnight.   ROS: Limited due to cognitive/behavioral    Objective:   No results found. Recent Labs    05/16/24 0538  WBC 13.6*  HGB 14.2  HCT 44.7  PLT 283    No results for input(s): NA, K, CL, CO2, GLUCOSE, BUN, CREATININE, CALCIUM  in the last 72 hours.   Intake/Output Summary (Last 24 hours) at 05/16/2024 1129 Last data filed at 05/15/2024 1843 Gross per 24 hour  Intake 320 ml  Output --  Net 320 ml        Physical Exam: Vital Signs Blood pressure (!) 119/59, pulse (!) 57, temperature 97.9 F (36.6 C), resp. rate 18, height 5' 3.5 (1.613 m), weight 88.6 kg, SpO2 100%.   Constitutional: No distress . Vital signs reviewed. HEENT: NCAT, EOMI, oral membranes moist Neck: supple Cardiovascular: RRR without murmur. No JVD    Respiratory/Chest: CTA Bilaterally without wheezes or rales. Normal effort    GI/Abdomen: BS +, non-tender, non-distended Ext: no clubbing, cyanosis, or edema Psych: pleasant and cooperative  Skin: No evidence of breakdown, no evidence of rash Neurologic: Cranial nerves II through XII intact, Sensory exam normal sensation to light touch in bilateral upper and lower extremities  Neuro:  much more alert today. . Right gaze preference ongoing but can be cued to left verbally. Follows basic commands. Voice stronger, speech sl dysarthric, left central VII. MMT:LUE 0/5. ?perhaps trace Left elbow flexion and Hip Ext LLE when extended  but otherwise 0/5 . Moves right side freely. Senses pain and light touch in all 4's. LUE DTR's 1+. LLE DTR's brisk 3+ with early extensor tone, toes up.  Prior neuro assessment is c/w 05/16/2024 exam.  Musculoskeletal: no pain with AROM/PROM    Assessment/Plan: 1. Functional deficits which require 3+ hours per day of interdisciplinary therapy in a  comprehensive inpatient rehab setting. Physiatrist is providing close team supervision and 24 hour management of active medical problems listed below. Physiatrist and rehab team continue to assess barriers to discharge/monitor patient progress toward functional and medical goals  Care Tool:  Bathing    Body parts bathed by patient: Abdomen, Front perineal area, Left arm, Face, Chest   Body parts bathed by helper: Right arm, Buttocks, Right upper leg, Left upper leg, Right lower leg, Left lower leg     Bathing assist Assist Level: Maximal Assistance - Patient 24 - 49%     Upper Body Dressing/Undressing Upper body dressing   What is the patient wearing?: Pull over shirt    Upper body assist Assist Level: Moderate Assistance - Patient 50 - 74%    Lower Body Dressing/Undressing Lower body dressing      What is the patient wearing?: Incontinence brief, Pants     Lower body assist Assist for lower body dressing: Total Assistance - Patient < 25%     Toileting Toileting    Toileting assist Assist for toileting: 2 Helpers     Transfers Chair/bed transfer  Transfers assist  Chair/bed transfer activity did not occur: Safety/medical concerns  Chair/bed transfer assist level: 2 Helpers (MODA +2 to R  side; squat pivot)     Locomotion Ambulation   Ambulation assist   Ambulation activity did not occur: Safety/medical concerns  Assist level: 2 helpers Assistive device: Other (comment) (R hand rail\) Max distance: 65ft   Walk 10 feet activity   Assist  Walk 10 feet activity did not occur: Safety/medical concerns  Assist level: 2 helpers Assistive device: Other (comment) (R hand rail)   Walk 50 feet activity   Assist Walk 50 feet with 2 turns activity did not occur: Safety/medical concerns         Walk 150 feet activity   Assist Walk 150 feet activity did not occur: Safety/medical concerns         Walk 10 feet on uneven surface  activity   Assist  Walk 10 feet on uneven surfaces activity did not occur: Safety/medical concerns         Wheelchair     Assist Is the patient using a wheelchair?: Yes Type of Wheelchair: Manual    Wheelchair assist level: Dependent - Patient 0% Max wheelchair distance: 150    Wheelchair 50 feet with 2 turns activity    Assist        Assist Level: Dependent - Patient 0%   Wheelchair 150 feet activity     Assist      Assist Level: Dependent - Patient 0%   Blood pressure (!) 119/59, pulse (!) 57, temperature 97.9 F (36.6 C), resp. rate 18, height 5' 3.5 (1.613 m), weight 88.6 kg, SpO2 100%.  Medical Problem List and Plan: 1. 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 Pt is s/p IR w TICI2b. 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  Antithrombotics: -DVT/anticoagulation:  Pharmaceutical: Xarelto              -antiplatelet therapy: N/A 3. Pain Management: Tylenol  prn for pain              --Has been on topamax  for HA in the past.   -pain seems controlled for the most part. Would like to stay with tylenol  if possible given arousal 4. Mood/Behavior/Sleep: LCSW to follow for evaluaton and support.              --trazodone  prn for insomnia.              -antipsychotic agents: N/A 5. Neuropsych/cognition: This patient is capable of making decisions on her own behalf.  9/1 continue trial of amantadine  to help with arousal. It seems to be benefiting her.      6. Skin/Wound Care: Routine pressure relief measures.  7. Fluids/Electrolytes/Nutrition: Monitor I/O. Add CM restrictions to diet             -Post stroke Dysphagia --SLP following. D2 diet/thins            -encourage PO, protein supp for low albumin  8. A fib: Monitor HR TID-- Metoprolol  succinate 200  mg daily  --Continue Xarelto .  9. HTN: Monitor BP TID. On metoprolol  and aldactone  Vitals:   05/15/24 1950 05/16/24 0507  BP: 113/80 (!) 119/59  Pulse: 67 (!) 57  Resp: 18  18  Temp: 97.8 F (36.6 C) 97.9 F (36.6 C)  SpO2: 99% 100%    10. NICM w/HFrEF: Discussed with Cardiology Dr. Maceo. Digoxin  and metoprolol  tartrate was stooped. Entresto  not continued. She is continued on spironolactone  and Metoprolol  succinate 200mg  daily in the AM. Trying to keep regimen simple. Appreciate the follow up 11.  Pre-diabetes: had been On Farxiga . Added CM restrictions to diet.  --Monitor BS ac/hs and use SSI tor elevated BS.  CBG (last 3)  Recent Labs    05/14/24 0623 05/15/24 0626 05/16/24 0615  GLUCAP 109* 100* 107*  Controlled 9/1 only daily checks 12. HLD- LDL 109 crestor  40 mg daily  13. Obesity:  BMI 34.84 educate on diet and weight loss to promote overall health and mobility.  14. Leukocytosis: no s/s infection WBC 14.2--13.9--11.9   -9/1 sl elevation today to 13.6   -continue to monitor   15. Slow trans constipation:   -miralax  scheduled with prns  -last bm 8/30  LOS: 7 days A FACE TO FACE EVALUATION WAS PERFORMED  [name] 05/16/2024, 11:29 AM

## 2024-05-16 NOTE — Progress Notes (Signed)
 Chart reviewed. Vitals are stable. Cardiology will sign off. Please call with questions.   Signed, Darryle DASEN. Barbaraann, MD, [facility]  [facility]  [address] [place], [place] [address] [phone]  7:58 AM

## 2024-05-17 DIAGNOSIS — I63521 Cerebral infarction due to unspecified occlusion or stenosis of right anterior cerebral artery: Secondary | ICD-10-CM | POA: Diagnosis not present

## 2024-05-17 LAB — GLUCOSE, CAPILLARY: Glucose-Capillary: 103 mg/dL — ABNORMAL HIGH (ref 70–99)

## 2024-05-17 NOTE — Progress Notes (Signed)
 Occupational Therapy Weekly Progress Note  Patient Details  Name: [name] MRN: [id number] Date of Birth: 29-Jan-1966  Beginning of progress report period: May 10, 2024 End of progress report period: May 17, 2024   Patient has met 3 of 4 short term goals.  Pt is making excellent progress with midline control and balance, scanning to her L side and ability to transfer with stedy lift to enable her to use toilet and tub bench.  Pt is having some increased tone in LUE but no active movement.   Patient continues to demonstrate the following deficits: abnormal tone and decreased motor planning, decreased attention to left, decreased awareness and decreased problem solving, and decreased sitting balance, decreased standing balance, decreased postural control, hemiplegia, and decreased balance strategies and therefore will continue to benefit from skilled OT intervention to enhance overall performance with BADL.  Patient progressing toward long term goals..  Continue plan of care.  OT Short Term Goals Week 1:  OT Short Term Goal 1 (Week 1): Pt will maintain static sit EOB with CGA to supervision to engage in UB self care safely. OT Short Term Goal 1 - Progress (Week 1): Met OT Short Term Goal 2 (Week 1): Pt will complete squat pivot transfers from bed >< w/c with mod A or less with 1 person to work towards a safe toilet transfers. OT Short Term Goal 2 - Progress (Week 1): Progressing toward goal OT Short Term Goal 3 (Week 1): Pt will demonstrate improved L arm awareness to attend to arm during bed mobility tasks with min cues. OT Short Term Goal 3 - Progress (Week 1): Met OT Short Term Goal 4 (Week 1): Pt will be able to don pull over shirt with mod A. OT Short Term Goal 4 - Progress (Week 1): Met Week 2:  OT Short Term Goal 1 (Week 2): Pt will complete squat pivot toilet transfers with mod A or less. OT Short Term Goal 2 (Week 2): Pt will be able to hold static stand with min A to  enable caregiver to adjust clothing over hips. OT Short Term Goal 3 (Week 2): pt will be able to reach feet with long sponge to wash feet. OT Short Term Goal 4 (Week 2): pt will complete self ROM for LUE with min A.   Therapy Documentation Precautions:  Precautions Precautions: Fall Recall of Precautions/Restrictions: Impaired Restrictions Weight Bearing Restrictions Per Provider Order: No  ADL: ADL Eating: Supervision/safety Grooming: Setup Upper Body Bathing: Minimal assistance Lower Body Bathing: Moderate assistance Where Assessed-Lower Body Bathing: Shower Upper Body Dressing: Moderate assistance Where Assessed-Upper Body Dressing: Wheelchair Lower Body Dressing: Dependent Where Assessed-Lower Body Dressing: Wheelchair Toileting: Dependent Where Assessed-Toileting: Teacher, adult education: Other (comment) (used stedy lift with +2)     Cordarious Zeek 05/17/2024, 12:34 PM

## 2024-05-17 NOTE — Plan of Care (Signed)
 Goals downgraded due to patients current progress Problem: RH Swallowing Goal: LTG Patient will consume least restrictive diet using compensatory strategies with assistance (SLP) Description: LTG:  Patient will consume least restrictive diet using compensatory strategies with assistance (SLP) Flowsheets (Taken 05/17/2024 1231) LTG: Pt Patient will consume least restrictive diet using compensatory strategies with assistance of (SLP): Minimal Assistance - Patient > 75%   Problem: RH Cognition - SLP Goal: RH LTG Patient will demonstrate orientation with cues Description:  LTG:  Patient will demonstrate orientation to person/place/time/situation with cues (SLP)   Flowsheets (Taken 05/17/2024 1231) LTG: Patient will demonstrate orientation using cueing (SLP): Supervision   Problem: RH Problem Solving Goal: LTG Patient will demonstrate problem solving for (SLP) Description: LTG:  Patient will demonstrate problem solving for basic/complex daily situations with cues  (SLP) Flowsheets (Taken 05/11/2024 1132 by Lars Joane RAMAN, CCC-SLP) LTG Patient will demonstrate problem solving for: Minimal Assistance - Patient > 75%   Problem: RH Memory Goal: LTG Patient will demonstrate ability for day to day (SLP) Description: LTG:   Patient will demonstrate ability for day to day recall/carryover during cognitive/linguistic activities with assist  (SLP) Flowsheets (Taken 05/11/2024 1132 by Lars Joane RAMAN, CCC-SLP) LTG: Patient will demonstrate ability for day to day recall: New information LTG: Patient will demonstrate ability for day to day recall/carryover during cognitive/linguistic activities with assist (SLP): Minimal Assistance - Patient > 75% Goal: LTG Patient will use memory compensatory aids to (SLP) Description: LTG:  Patient will use memory compensatory aids to recall biographical/new, daily complex information with cues (SLP) Flowsheets (Taken 05/11/2024 1132 by Lars Joane RAMAN, CCC-SLP) LTG: Patient will  use memory compensatory aids to (SLP): Minimal Assistance - Patient > 75%   Problem: RH Attention Goal: LTG Patient will demonstrate this level of attention during functional activites (SLP) Description: LTG:  Patient will will demonstrate this level of attention during functional activites (SLP) Flowsheets Taken 05/17/2024 1231 by Stephanie, Tay Whitwell F, CCC-SLP LTG: Patient will demonstrate this level of attention during cognitive/linguistic activities with assistance of (SLP): Moderate Assistance - Patient 50 - 74% Taken 05/11/2024 1132 by Lars Joane RAMAN, CCC-SLP Patient will demonstrate during cognitive/linguistic activities the attention type of: Sustained

## 2024-05-17 NOTE — Progress Notes (Signed)
 PROGRESS NOTE   Subjective/Complaints: Eating well with sup, D1 diet , no pain c/os  ROS: Limited due to cognitive/behavioral but denies breathing , bladder and bowel issues, denies incont  despite mult documented episodes of bladder incont    Objective:   No results found. Recent Labs    05/16/24 0538  WBC 13.6*  HGB 14.2  HCT 44.7  PLT 283    Recent Labs    05/16/24 1657  NA 137  K 4.3  CL 102  CO2 21*  GLUCOSE 113*  BUN 11  CREATININE 1.10*  CALCIUM  10.0     Intake/Output Summary (Last 24 hours) at 05/17/2024 0825 Last data filed at 05/16/2024 1834 Gross per 24 hour  Intake 356 ml  Output --  Net 356 ml        Physical Exam: Vital Signs Blood pressure (!) 129/94, pulse 81, temperature 98.2 F (36.8 C), resp. rate 16, height 5' 3.5 (1.613 m), weight 88.6 kg, SpO2 98%.   General: No acute distress Mood and affect are appropriate Heart: Regular rate and rhythm no rubs murmurs or extra sounds Lungs: Clear to auscultation, breathing unlabored, no rales or wheezes Abdomen: Positive bowel sounds, soft nontender to palpation, nondistended Extremities: No clubbing, cyanosis, or edema Skin: No evidence of breakdown, no evidence of rash Neurologic: Cranial nerves II through XII intact, motor strength is 5/5 in bilateral deltoid, bicep, tricep, grip, hip flexor, knee extensors, ankle dorsiflexor and plantar flexor   Neuro:  much more alert today. . Right gaze preference ongoing but can be cued to left verbally. Follows basic commands. Voice stronger, speech sl dysarthric, left central VII. MMT:LUE 0/5. ?perhaps trace Left elbow flexion and Hip Ext LLE when extended  but otherwise 0/5 . Moves right side freely. Senses pain and light touch in all 4's. LUE DTR's 1+. LLE DTR's brisk 3+ with early extensor tone, toes up. Increasing left pectoralis and elbow flexor tone MAS 2/3  Prior neuro assessment is c/w 05/17/2024  exam.  Musculoskeletal: no pain with AROM/PROM    Assessment/Plan: 1. Functional deficits which require 3+ hours per day of interdisciplinary therapy in a comprehensive inpatient rehab setting. Physiatrist is providing close team supervision and 24 hour management of active medical problems listed below. Physiatrist and rehab team continue to assess barriers to discharge/monitor patient progress toward functional and medical goals  Care Tool:  Bathing    Body parts bathed by patient: Abdomen, Front perineal area, Left arm, Face, Chest   Body parts bathed by helper: Right arm, Buttocks, Right upper leg, Left upper leg, Right lower leg, Left lower leg     Bathing assist Assist Level: Maximal Assistance - Patient 24 - 49%     Upper Body Dressing/Undressing Upper body dressing   What is the patient wearing?: Pull over shirt    Upper body assist Assist Level: Moderate Assistance - Patient 50 - 74%    Lower Body Dressing/Undressing Lower body dressing      What is the patient wearing?: Incontinence brief, Pants     Lower body assist Assist for lower body dressing: Total Assistance - Patient < 25%     Toileting Toileting  Toileting assist Assist for toileting: 2 Helpers     Transfers Chair/bed transfer  Transfers assist  Chair/bed transfer activity did not occur: Safety/medical concerns  Chair/bed transfer assist level: 2 Helpers (MODA +2 to R side; squat pivot)     Locomotion Ambulation   Ambulation assist   Ambulation activity did not occur: Safety/medical concerns  Assist level: 2 helpers Assistive device: Other (comment) (R hand rail\) Max distance: 73ft   Walk 10 feet activity   Assist  Walk 10 feet activity did not occur: Safety/medical concerns  Assist level: 2 helpers Assistive device: Other (comment) (R hand rail)   Walk 50 feet activity   Assist Walk 50 feet with 2 turns activity did not occur: Safety/medical concerns         Walk  150 feet activity   Assist Walk 150 feet activity did not occur: Safety/medical concerns         Walk 10 feet on uneven surface  activity   Assist Walk 10 feet on uneven surfaces activity did not occur: Safety/medical concerns         Wheelchair     Assist Is the patient using a wheelchair?: Yes Type of Wheelchair: Manual    Wheelchair assist level: Dependent - Patient 0% Max wheelchair distance: 150    Wheelchair 50 feet with 2 turns activity    Assist        Assist Level: Dependent - Patient 0%   Wheelchair 150 feet activity     Assist      Assist Level: Dependent - Patient 0%   Blood pressure (!) 129/94, pulse 81, temperature 98.2 F (36.8 C), resp. rate 16, height 5' 3.5 (1.613 m), weight 88.6 kg, SpO2 98%.  Medical Problem List and Plan: 1. 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 Pt is s/p IR w TICI2b. 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  Antithrombotics: -DVT/anticoagulation:  Pharmaceutical: Xarelto              -antiplatelet therapy: N/A 3. Pain Management: Tylenol  prn for pain              --Has been on topamax  for HA in the past.   -pain seems controlled for the most part. Would like to stay with tylenol  if possible given arousal 4. Mood/Behavior/Sleep: LCSW to follow for evaluaton and support.              --trazodone  prn for insomnia.              -antipsychotic agents: N/A 5. Neuropsych/cognition: This patient is capable of making decisions on her own behalf.  9/2 continue trial of amantadine  to help with arousal. It seems to be benefiting her. Will d/w team at conf in am      6. Skin/Wound Care: Routine pressure relief measures.  7. Fluids/Electrolytes/Nutrition: Monitor I/O. Add CM restrictions  to diet             -Post stroke Dysphagia --SLP following. D2 diet/thins            -encourage PO, protein supp for low albumin  8.  A fib: Monitor HR TID-- Metoprolol  succinate 200 mg daily  --Continue Xarelto .  9. HTN: Monitor BP TID. On metoprolol  and aldactone  Vitals:   05/16/24 1931 05/17/24 0437  BP: 120/63 (!) 129/94  Pulse: 62 81  Resp: 16 16  Temp: 98 F (36.7 C) 98.2 F (36.8 C)  SpO2: 100% 98%    10. NICM w/HFrEF: Discussed with Cardiology Dr. Maceo. Digoxin  and metoprolol  tartrate was stooped. Entresto  not continued. She is continued on spironolactone  and Metoprolol  succinate 200mg  daily in the AM. Trying to keep regimen simple. Appreciate the follow up 11.  Pre-diabetes: had been On Farxiga . Added CM restrictions to diet.  --Monitor BS ac/hs and use SSI tor elevated BS.  CBG (last 3)  Recent Labs    05/15/24 0626 05/16/24 0615  GLUCAP 100* 107*  Controlled 9/2 only daily checks 12. HLD- LDL 109 crestor  40 mg daily  13. Obesity:  BMI 34.84 educate on diet and weight loss to promote overall health and mobility.  14. Leukocytosis: no s/s infection WBC 14.2--13.9--11.9   -9/1 sl elevation today to 13.6   -continue to monitor   15. Slow trans constipation:   -miralax  scheduled with prns  -last bm 8/30  LOS: 8 days A FACE TO FACE EVALUATION WAS PERFORMED  Prentice FORBES Compton 05/17/2024, 8:25 AM

## 2024-05-17 NOTE — Progress Notes (Signed)
 Occupational Therapy Session Note  Patient Details  Name: [name] MRN: [id number] Date of Birth: 1965/12/17  Today's Date: 05/17/2024 OT Individual Time: 9169-9054 OT Individual Time Calculation (min): 75 min    Short Term Goals: Week 1:  OT Short Term Goal 1 (Week 1): Pt will maintain static sit EOB with CGA to supervision to engage in UB self care safely. OT Short Term Goal 2 (Week 1): Pt will complete squat pivot transfers from bed >< w/c with mod A or less with 1 person to work towards a safe toilet transfers. OT Short Term Goal 3 (Week 1): Pt will demonstrate improved L arm awareness to attend to arm during bed mobility tasks with min cues. OT Short Term Goal 4 (Week 1): Pt will be able to don pull over shirt with mod A.  Skilled Therapeutic Interventions/Progress Updates:    Pt received in bed and receptive to a shower. Had +2 A from rehab tech for first part of session.  With cues for L side awareness and L visual scanning, pt able to: -roll and rise from supine to sit with max A -sit to stand to stedy from bed min A, low toilet and tub bench mod A and from TIS with min A - held stand in stedy with min A and cues to keep head in midline -held balance on both toilet and tub bench with min A with occasional cues to shift to her R.  -bathed using long sponge for lower legs but still needed help with feet, bottom, R arm -able to sit to stand from wc with max A of 1 and held balance with max A and R hand on bedrail as tech helped to pull up pants -good attention to tasks and followed directions fairly well but continues to need cues to attend to L hand  Pt's family arrived midway through session and able to see some of her progress.  Pt resting in TIS wc with all needs met and belt alarm on.    Therapy Documentation Precautions:  Precautions Precautions: Fall Recall of Precautions/Restrictions: Impaired Restrictions Weight Bearing Restrictions Per Provider Order: No    Pain: Pain Assessment Pain Score: 0-No pain ADL: ADL Eating: Supervision/safety Grooming: Setup Upper Body Bathing: Minimal assistance Lower Body Bathing: Moderate assistance Where Assessed-Lower Body Bathing: Shower Upper Body Dressing: Moderate assistance Where Assessed-Upper Body Dressing: Wheelchair Lower Body Dressing: Dependent Where Assessed-Lower Body Dressing: Wheelchair Toileting: Dependent Where Assessed-Toileting: Teacher, adult education: Other (comment) (used stedy lift with +2)   Therapy/Group: Individual Therapy  [name] 05/17/2024, 12:07 PM

## 2024-05-17 NOTE — Progress Notes (Signed)
 Speech Language Pathology Weekly Progress and Session Note  Patient Details  Name: [name] MRN: [id number] Date of Birth: 1966-07-03  Beginning of progress report period: May 10, 2024 End of progress report period: May 17, 2024  Today's Date: 05/17/2024 SLP Individual Time: 1240-1340 SLP Individual Time Calculation (min): 60 min  Short Term Goals: Week 1: SLP Short Term Goal 1 (Week 1): Pt will trial Dys 3 textures with SLP only demonstrating timely and functional oral phase of swallow provided min-mod A cues SLP Short Term Goal 1 - Progress (Week 1): Not met SLP Short Term Goal 2 (Week 1): Patient will utilize swallowing compensatory strategies to reduce s/sx of aspiration during consumption of PO given min multimodal A SLP Short Term Goal 2 - Progress (Week 1): Met SLP Short Term Goal 3 (Week 1): Patient will attend to L side during functional tasks given mod multimodal A  Patient will attend to the left field during functional problem solving tasks in 4/5 opportunities given max multimodal cues. SLP Short Term Goal 3 - Progress (Week 1): Met SLP Short Term Goal 4 (Week 1): Patient will participate in a formal cognitive assessment in order to determine potential cognitive intervention SLP Short Term Goal 4 - Progress (Week 1): Met    New Short Term Goals: Week 2: SLP Short Term Goal 1 (Week 2): Patient will consume least restrictive diet with use of swallowing compensatory strategies given supervision multimodal A SLP Short Term Goal 2 (Week 2): Patient will demonstrate orientation to self, location, situation and time given min multimodal A SLP Short Term Goal 3 (Week 2): Patient will demonstrate attention to the L during functional tasks given mod multimodal A SLP Short Term Goal 4 (Week 2): Patient will recall activities completed in prior therapy sessions given mod multimodal A  Weekly Progress Updates: Pt has made good gains and has met 3 of 4 STGs this reporting  period due to completion of cognitive evaluation, reduced s/sx of aspiration and improved L attention. Currently, patient continues to require max A for attending the L side and minA for use of compensatory strategies during consumption of D1/thin liquids.  Pt/family eduction ongoing. Pt would benefit from continued ST intervention to maximize cognition and dysphagia in order to maximize functional independence at d/c.    Intensity: Minumum of 1-2 x/day, 30 to 90 minutes Frequency: 3 to 5 out of 7 days Duration/Length of Stay: 9/23 Treatment/Interventions: Cognitive remediation/compensation;Speech/Language facilitation;Functional tasks;Therapeutic Activities;Internal/external aids;Therapeutic Exercise;Dysphagia/aspiration precaution training;Patient/family education   Daily Session  Skilled Therapeutic Interventions:  Skilled therapy session focused on cognitive and dysphagia goals. Patient oriented x4 independently. SLP targeted cognitive goals through L attention task. Patient required modA to attend to the L to locate items, words, and numbers. SLP then targeted dysphagia goals through observing patient with D1/thin liquid lunch tray. Patient with timely mastication, complete oral clearance and no s/sx of aspiration. Continue current diet with full supervision. At the end of the session, patient independently shared information re: family and life. Patient left in chair with alarm set and call bell in reach. Continue POC     Pain None reported   Therapy/Group: Individual Therapy  Marijayne Rauth M.A., CCC-SLP 05/17/2024, 12:29 PM

## 2024-05-17 NOTE — Progress Notes (Signed)
 Physical Therapy Session Note  Patient Details  Name: [name] MRN: [id number] Date of Birth: 1966/01/23  Today's Date: 05/17/2024 PT Individual Time: 1115-1202 PT Individual Time Calculation (min): 47 min   Short Term Goals: Week 1:  PT Short Term Goal 1 (Week 1): Pt will transfer sup to sit w/ max A x 1. PT Short Term Goal 2 (Week 1): Pt will transfer bed <> w/c w/ max A x 1 consistently, SB or SPT. PT Short Term Goal 3 (Week 1): PT to assess gait. PT Short Term Goal 4 (Week 1): Pt to maintain seated balance w/o trunk support x 10' w/ min A.  Skilled Therapeutic Interventions/Progress Updates: Patient sitting in TIS following nsg care (missed minutes recorded) with family also present on entrance to room. Patient alert and agreeable to PT session.   Patient reported no pain,only continued fatigue. Education provided to pt and pt family for rationale of NMRE, as well as pt's increased tone presentation on L LE  Therapeutic Activity: Transfers: Pt performed sit<>stands in preparation for gait training from TIS with minA and cues for anterior weight shift and hand placement. Pt performed squat pivot transfer from TIS<edge of mat (to L) with VC for head/hip relationship and sequence (maxA), and the from edge of mat<TIS with minA (to the R) with cues for use of R UE to assist with pulling self over on far arm rest. Pt performed scoot to R to set up squat pivot transfer with supervision.   Gait Training:  Pt ambulated roughly 30' x 2 using RHR with TIS follow and L AFO/shoe cap donned with personal shoes. Pt demonstrated the following gait deviations with therapist providing the described cuing and facilitation for improvement: - Increased extensor tone in L LE (no buckling noted throughout) - maxA to advance L LE through gait cycle and to place (noted hip compensation) - Decreased step length on R LE with VC to increase throughout.  Neuromuscular Re-ed: - Muscle Energy Technique to  promote stretch reflex and increased L quadriceps activation for improved strength and neuromuscular controls. 2 rounds performed with added cuing on breathing technique. Pt also performed technique on edge of mat following gait training on L knee flexors with added tactile stimulation to inhibit tone present (pt also reported tightness with reports of decrease following) - Pt performed static standing balance with mirror feedback modA + 2 and PTA blocking L knee (knee in flexion with multimodal cuing to quadriceps to fully extend). On first round, pt with increased knee flexor tone - seated rest break required with PTA initiating muscle energy technique noted above to decrease tone, and to increase knee flexor activation). Pt with noted improvement in ability to extend knee following (Still required mod/maxA).   NMR performed for improvements in motor control and coordination, balance, sequencing, judgement, and self confidence/ efficacy in performing all aspects of mobility at highest level of independence.   Patient sitting in TIS at end of session with brakes locked, family present, belt alarm set, and all needs within reach.      Therapy Documentation Precautions:  Precautions Precautions: Fall Recall of Precautions/Restrictions: Impaired Restrictions Weight Bearing Restrictions Per Provider Order: No  Therapy/Group: Individual Therapy  Grady Lucci PTA 05/17/2024, 12:31 PM

## 2024-05-17 NOTE — Plan of Care (Signed)
  Problem: Consults Goal: RH STROKE PATIENT EDUCATION Description: See Patient Education module for education specifics  Outcome: Progressing Goal: Nutrition Consult-if indicated Outcome: Progressing Goal: Diabetes Guidelines if Diabetic/Glucose > 140 Description: If diabetic or lab glucose is > 140 mg/dl - Initiate Diabetes/Hyperglycemia Guidelines & Document Interventions  Outcome: Progressing   Problem: RH BOWEL ELIMINATION Goal: RH STG MANAGE BOWEL WITH ASSISTANCE Description: STG Manage Bowel with mod I Assistance. Outcome: Progressing Goal: RH STG MANAGE BOWEL W/MEDICATION W/ASSISTANCE Description: STG Manage Bowel with Medication with mod I Assistance. Outcome: Progressing   Problem: RH SAFETY Goal: RH STG ADHERE TO SAFETY PRECAUTIONS W/ASSISTANCE/DEVICE Description: STG Adhere to Safety Precautions With cues Assistance/Device. Outcome: Progressing   Problem: RH KNOWLEDGE DEFICIT Goal: RH STG INCREASE KNOWLEDGE OF DIABETES Description: Patient and family will be able to manage DM using educational resources for medications and dietary modification independently Outcome: Progressing Goal: RH STG INCREASE KNOWLEDGE OF HYPERTENSION Description: Patient and family will be able to manage HTN using educational resources for medications and dietary modification independently Outcome: Progressing Goal: RH STG INCREASE KNOWLEDGE OF DYSPHAGIA/FLUID INTAKE Description: Patient and family will be able to manage Dysphagia using educational resources for medications and dietary modification independently Outcome: Progressing Goal: RH STG INCREASE KNOWLEGDE OF HYPERLIPIDEMIA Description: Patient and family will be able to manage HLD using educational resources for medications and dietary modification independently Outcome: Progressing Goal: RH STG INCREASE KNOWLEDGE OF STROKE PROPHYLAXIS Description: Patient and family will be able to manage secondary risks using educational resources  for medications and dietary modification independently Outcome: Progressing   Problem: Education: Goal: Ability to describe self-care measures that may prevent or decrease complications (Diabetes Survival Skills Education) will improve Outcome: Progressing Goal: Individualized Educational Video(s) Outcome: Progressing   Problem: Coping: Goal: Ability to adjust to condition or change in health will improve Outcome: Progressing   Problem: Fluid Volume: Goal: Ability to maintain a balanced intake and output will improve Outcome: Progressing   Problem: Health Behavior/Discharge Planning: Goal: Ability to identify and utilize available resources and services will improve Outcome: Progressing Goal: Ability to manage health-related needs will improve Outcome: Progressing

## 2024-05-18 DIAGNOSIS — I63521 Cerebral infarction due to unspecified occlusion or stenosis of right anterior cerebral artery: Secondary | ICD-10-CM | POA: Diagnosis not present

## 2024-05-18 LAB — GLUCOSE, CAPILLARY: Glucose-Capillary: 100 mg/dL — ABNORMAL HIGH (ref 70–99)

## 2024-05-18 NOTE — Progress Notes (Signed)
 Speech Language Pathology Daily Session Note  Patient Details  Name: AVIONNA BOWER MRN: [id number] Date of Birth: [birth year]  Today's Date: 05/18/2024 SLP Individual Time: 9199-9155 SLP Individual Time Calculation (min): 44 min  Short Term Goals: Week 2: SLP Short Term Goal 1 (Week 2): Patient will consume least restrictive diet with use of swallowing compensatory strategies given supervision multimodal A SLP Short Term Goal 2 (Week 2): Patient will demonstrate orientation to self, location, situation and time given min multimodal A SLP Short Term Goal 3 (Week 2): Patient will demonstrate attention to the L during functional tasks given mod multimodal A SLP Short Term Goal 4 (Week 2): Patient will recall activities completed in prior therapy sessions given mod multimodal A  Skilled Therapeutic Interventions: Skilled therapy session focused on cognitive goals. Patient oriented to self, situation, location and year. Patient required minA to utilize calendar to orient to date and day of the week. SLP continued to target cognitive goals through verbal problem solving task. Patient required supervisionA to verbalize accurate answers to problems. At the end of session, patient sequenced days of the week given modA to attend to the L side of the table. Patient left in bed with alarm set and call bell in reach. Continue POC  Pain denies  Therapy/Group: Individual Therapy  Karrie Fluellen M.A., CCC-SLP 05/18/2024, 7:34 AM

## 2024-05-18 NOTE — Progress Notes (Signed)
 Patient ID: [name], female   DOB: July 25, 1966, 58 y.o.   MRN: [id number]  Met with pt and her two daughter's to discuss team conference progress this week in therapies and how pleased team is with how well pt is doing better with sitting up and tranfers along with not pushing now. Still focus on discharge date of 9/23. Daughter gave worker FMLA forms and worker gave to PA to complete. Continue to work on discharge needs. Daughter's are here daily and can see her progress. All pleased with pt's progress this week

## 2024-05-18 NOTE — Plan of Care (Signed)
  Problem: Consults Goal: RH STROKE PATIENT EDUCATION Description: See Patient Education module for education specifics  Outcome: Progressing Goal: Nutrition Consult-if indicated Outcome: Progressing Goal: Diabetes Guidelines if Diabetic/Glucose > 140 Description: If diabetic or lab glucose is > 140 mg/dl - Initiate Diabetes/Hyperglycemia Guidelines & Document Interventions  Outcome: Progressing   Problem: RH BOWEL ELIMINATION Goal: RH STG MANAGE BOWEL WITH ASSISTANCE Description: STG Manage Bowel with mod I Assistance. Outcome: Progressing Goal: RH STG MANAGE BOWEL W/MEDICATION W/ASSISTANCE Description: STG Manage Bowel with Medication with mod I Assistance. Outcome: Progressing   Problem: RH SAFETY Goal: RH STG ADHERE TO SAFETY PRECAUTIONS W/ASSISTANCE/DEVICE Description: STG Adhere to Safety Precautions With cues Assistance/Device. Outcome: Progressing   Problem: RH KNOWLEDGE DEFICIT Goal: RH STG INCREASE KNOWLEDGE OF DIABETES Description: Patient and family will be able to manage DM using educational resources for medications and dietary modification independently Outcome: Progressing Goal: RH STG INCREASE KNOWLEDGE OF HYPERTENSION Description: Patient and family will be able to manage HTN using educational resources for medications and dietary modification independently Outcome: Progressing Goal: RH STG INCREASE KNOWLEDGE OF DYSPHAGIA/FLUID INTAKE Description: Patient and family will be able to manage Dysphagia using educational resources for medications and dietary modification independently Outcome: Progressing Goal: RH STG INCREASE KNOWLEGDE OF HYPERLIPIDEMIA Description: Patient and family will be able to manage HLD using educational resources for medications and dietary modification independently Outcome: Progressing Goal: RH STG INCREASE KNOWLEDGE OF STROKE PROPHYLAXIS Description: Patient and family will be able to manage secondary risks using educational resources  for medications and dietary modification independently Outcome: Progressing   Problem: Education: Goal: Ability to describe self-care measures that may prevent or decrease complications (Diabetes Survival Skills Education) will improve Outcome: Progressing Goal: Individualized Educational Video(s) Outcome: Progressing   Problem: Coping: Goal: Ability to adjust to condition or change in health will improve Outcome: Progressing   Problem: Fluid Volume: Goal: Ability to maintain a balanced intake and output will improve Outcome: Progressing   Problem: Health Behavior/Discharge Planning: Goal: Ability to identify and utilize available resources and services will improve Outcome: Progressing Goal: Ability to manage health-related needs will improve Outcome: Progressing   Problem: Metabolic: Goal: Ability to maintain appropriate glucose levels will improve Outcome: Progressing   Problem: Nutritional: Goal: Maintenance of adequate nutrition will improve Outcome: Progressing Goal: Progress toward achieving an optimal weight will improve Outcome: Progressing   Problem: Skin Integrity: Goal: Risk for impaired skin integrity will decrease Outcome: Progressing   Problem: Tissue Perfusion: Goal: Adequacy of tissue perfusion will improve Outcome: Progressing

## 2024-05-18 NOTE — Progress Notes (Signed)
 PROGRESS NOTE   Subjective/Complaints: Leg pain LLE earlier this am , points to back of L thigh   ROS: Limited due to cognitive/behavioral but denies breathing , bladder and bowel issues, denies incont  despite mult documented episodes of bladder incont    Objective:   No results found. Recent Labs    05/16/24 0538  WBC 13.6*  HGB 14.2  HCT 44.7  PLT 283    Recent Labs    05/16/24 1657  NA 137  K 4.3  CL 102  CO2 21*  GLUCOSE 113*  BUN 11  CREATININE 1.10*  CALCIUM  10.0     Intake/Output Summary (Last 24 hours) at 05/18/2024 0905 Last data filed at 05/18/2024 0803 Gross per 24 hour  Intake 476 ml  Output --  Net 476 ml        Physical Exam: Vital Signs Blood pressure 118/86, pulse 84, temperature 98.5 F (36.9 C), resp. rate 17, height 5' 3.5 (1.613 m), weight 88.6 kg, SpO2 98%.   General: No acute distress Mood and affect are appropriate Heart: Regular rate and rhythm no rubs murmurs or extra sounds Lungs: Clear to auscultation, breathing unlabored, no rales or wheezes Abdomen: Positive bowel sounds, soft nontender to palpation, nondistended Extremities: No clubbing, cyanosis, or edema Skin: No evidence of breakdown, no evidence of rash Neurologic: Cranial nerves II through XII intact, motor strength is 5/5 in bilateral deltoid, bicep, tricep, grip, hip flexor, knee extensors, ankle dorsiflexor and plantar flexor   Neuro:  much more alert today. . Right gaze preference ongoing but can be cued to left verbally. Follows basic commands. Voice stronger, speech sl dysarthric, left central VII. MMT:LUE 0/5. perhaps trace Left elbow flexion and Hip Ext LLE when extended  but otherwise 0/5 . Moves right side freely. Senses pain and light touch in all 4's. LUE DTR's 1+. LLE DTR's brisk 3+ with early extensor tone, toes up. Increasing left pectoralis and elbow flexor tone MAS 2/3  Prior neuro assessment is c/w  05/18/2024 exam.  + triple flexor Musculoskeletal: no pain with AROM/PROM  No swelling LLE, no hamstrings or adductor pain    Assessment/Plan: 1. Functional deficits which require 3+ hours per day of interdisciplinary therapy in a comprehensive inpatient rehab setting. Physiatrist is providing close team supervision and 24 hour management of active medical problems listed below. Physiatrist and rehab team continue to assess barriers to discharge/monitor patient progress toward functional and medical goals  Care Tool:  Bathing    Body parts bathed by patient: Abdomen, Front perineal area, Left arm, Face, Chest, Right upper leg, Left upper leg   Body parts bathed by helper: Right lower leg, Left lower leg, Right arm     Bathing assist Assist Level: Moderate Assistance - Patient 50 - 74%     Upper Body Dressing/Undressing Upper body dressing   What is the patient wearing?: Pull over shirt    Upper body assist Assist Level: Moderate Assistance - Patient 50 - 74%    Lower Body Dressing/Undressing Lower body dressing      What is the patient wearing?: Incontinence brief, Pants     Lower body assist Assist for lower body dressing:  Total Assistance - Patient < 25%     Toileting Toileting    Toileting assist Assist for toileting: 2 Helpers     Transfers Chair/bed transfer  Transfers assist  Chair/bed transfer activity did not occur: Safety/medical concerns  Chair/bed transfer assist level: 2 Helpers     Locomotion Ambulation   Ambulation assist   Ambulation activity did not occur: Safety/medical concerns  Assist level: 2 helpers Assistive device: Other (comment) (R hand rail\) Max distance: 108ft   Walk 10 feet activity   Assist  Walk 10 feet activity did not occur: Safety/medical concerns  Assist level: 2 helpers Assistive device: Other (comment) (R hand rail)   Walk 50 feet activity   Assist Walk 50 feet with 2 turns activity did not occur:  Safety/medical concerns         Walk 150 feet activity   Assist Walk 150 feet activity did not occur: Safety/medical concerns         Walk 10 feet on uneven surface  activity   Assist Walk 10 feet on uneven surfaces activity did not occur: Safety/medical concerns         Wheelchair     Assist Is the patient using a wheelchair?: Yes Type of Wheelchair: Manual    Wheelchair assist level: Dependent - Patient 0% Max wheelchair distance: 150    Wheelchair 50 feet with 2 turns activity    Assist        Assist Level: Dependent - Patient 0%   Wheelchair 150 feet activity     Assist      Assist Level: Dependent - Patient 0%   Blood pressure 118/86, pulse 84, temperature 98.5 F (36.9 C), resp. rate 17, height 5' 3.5 (1.613 m), weight 88.6 kg, SpO2 98%.  Medical Problem List and Plan: 1. 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 Pt is s/p IR w TICI2b. 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  Antithrombotics: -DVT/anticoagulation:  Pharmaceutical: Xarelto              -antiplatelet therapy: N/A 3. Pain Management: Tylenol  prn for pain              --Has been on topamax  for HA in the past.   -pain seems controlled for the most part. Would like to stay with tylenol  if possible given arousal 4. Mood/Behavior/Sleep: LCSW to follow for evaluaton and support.              --trazodone  prn for insomnia.              -antipsychotic agents: N/A 5. Neuropsych/cognition: This patient is capable of making decisions on her own behalf.  9/2 continue trial of amantadine  to help with arousal. It seems to be benefiting her. Will d/w team at conf in am      6. Skin/Wound Care: Routine pressure relief measures.  7.  Fluids/Electrolytes/Nutrition: Monitor I/O. Add CM restrictions to diet             -Post stroke Dysphagia --SLP following. D2 diet/thins            -  encourage PO, protein supp for low albumin  8. A fib: Monitor HR TID-- Metoprolol  succinate 200 mg daily  --Continue Xarelto .  9. HTN: Monitor BP TID. On metoprolol  and aldactone  Vitals:   05/18/24 0508 05/18/24 0757  BP: 119/74 118/86  Pulse: 87 84  Resp: 17   Temp: 98.5 F (36.9 C)   SpO2: 98%     10. NICM w/HFrEF: Discussed with Cardiology Dr. Maceo. Digoxin  and metoprolol  tartrate was stooped. Entresto  not continued. She is continued on spironolactone  and Metoprolol  succinate 200mg  daily in the AM. Trying to keep regimen simple. Appreciate the follow up 11.  Pre-diabetes: had been On Farxiga . Added CM restrictions to diet.  --Monitor BS ac/hs and use SSI tor elevated BS.  CBG (last 3)  Recent Labs    05/16/24 0615 05/17/24 0625 05/18/24 0629  GLUCAP 107* 103* 100*  Controlled 9/3 only daily checks 12. HLD- LDL 109 crestor  40 mg daily  13. Obesity:  BMI 34.84 educate on diet and weight loss to promote overall health and mobility.  14. Leukocytosis: no s/s infection WBC 14.2--13.9--11.9   -9/1 sl elevation today to 13.6   -continue to monitor   15. Slow trans constipation:   -miralax  scheduled with prns  -last bm 8/30  LOS: 9 days A FACE TO FACE EVALUATION WAS PERFORMED  Prentice FORBES Compton 05/18/2024, 9:05 AM

## 2024-05-18 NOTE — Progress Notes (Signed)
 Physical Therapy Session Note  Patient Details  Name: [name] MRN: [id number] Date of Birth: [birth year]  Today's Date: 05/18/2024 PT Individual Time: 8962-8867 PT Individual Time Calculation (min): 55 min   Short Term Goals: Week 1:  PT Short Term Goal 1 (Week 1): Pt will transfer sup to sit w/ max A x 1. PT Short Term Goal 2 (Week 1): Pt will transfer bed <> w/c w/ max A x 1 consistently, SB or SPT. PT Short Term Goal 3 (Week 1): PT to assess gait. PT Short Term Goal 4 (Week 1): Pt to maintain seated balance w/o trunk support x 10' w/ min A.  Skilled Therapeutic Interventions/Progress Updates: Patient sitting in TIS with family present on entrance to room. Patient alert and agreeable to PT session.   Patient reported no pain during session. Transported to outside of main gym to ambulate via RHR with TIS follow. Pt required minA to sit<>stand with R UE support. Pt with L AFO donned L LE. Pt ambulated 35' x 1 with overall modA and noted L hip compensation to advance L LE through swing. Pt cued to increase step length R LE in order to promote increase hip flexor activation (cued on last 10 or so feet to bring L knee up to PTA hand with PTA occasionally adjusting step placement but pt able to somewhat advance with LE mostly in extended position). Pt required manual weight shift facilitation. Pt required seated rest break. Pt performed squat pivot transfer from TIS<edge of mat with modA to the R, and maxA to pivot from edge of mat<TIS at end of session to the R due to standing up vs cued squat pivot that required R knee block + 2 with R UE support from daughter in order to safely place R LE away from twisted mechanics (pt cued to slightly step R LE forward and out with small short step occurrence to safely sit). - Muscle Energy Technique to promote stretch reflex and increased L knee flexor activation for improved strength and neuromuscular connection (added very minimal resistance during knee  flexion with very minimal activation noted and difficult to determine if pt self performed through AROM, or if due to stretch reflex and tone). Pt performed same technique on R LE knee flexors to promote musculature engagement when standing per increased extensor tone presentation. Pt performed mass practice of static stance with PTA on L providing knee block with L knee maintained in flexion due to increase in tone. Pt provided with light tactile stimulation to engage R knee flexors during mini squat performance, as well as tactile cuing on L quadriceps. + 2 present on R to assist with hand hold and pt cued to shift weight to R (difficult for pt to perform due to increase in pushing via knee extensors in R LE this session).   Patient sitting in TIS at end of session with brakes locked, family present, belt alarm set, and all needs within reach.      Therapy Documentation Precautions:  Precautions Precautions: Fall Recall of Precautions/Restrictions: Impaired Restrictions Weight Bearing Restrictions Per Provider Order: No   Therapy/Group: Individual Therapy  Lahna Nath PTA 05/18/2024, 12:16 PM

## 2024-05-18 NOTE — Progress Notes (Signed)
 Occupational Therapy Session Note  Patient Details  Name: [name] MRN: [id number] Date of Birth: [birth year]  Today's Date: 05/18/2024 OT Individual Time: 0850-0949+1416-1504 OT Individual Time Calculation (min): 59 min    Short Term Goals: Week 2:  OT Short Term Goal 1 (Week 2): Pt will complete squat pivot toilet transfers with mod A or less. OT Short Term Goal 2 (Week 2): Pt will be able to hold static stand with min A to enable caregiver to adjust clothing over hips. OT Short Term Goal 3 (Week 2): pt will be able to reach feet with long sponge to wash feet. OT Short Term Goal 4 (Week 2): pt will complete self ROM for LUE with min A.  Skilled Therapeutic Interventions/Progress Updates:  Session 1: Pt greeted supine in bed, pt agreeable to OT intervention.     Pts daughter [name] present during session, daughter assisted as needed with stedy transfers. No pain reported during session.   Transfers/bed mobility/functional mobility:  Pt completed supine>sit with MODA +1 today, assisted needed to manage LLE and elevate trunk but improved initiation and sitting balance today. Pt completed several sit>stands in stedy during ADLs with pt able to stand to stedy with MIN A +2,emphasis on hooking LLE to place BLEs in stedy and hand over hand grasp on stedy to promote improved attention to L side and midline orientation.   Pt did complete one sit>stand from EOB with face to face assist with MODA to remove brief under pt.    ADLs:  Grooming: pt completed oral care at sink with set- up assist using hemi technique with toothbrush placement UB dressing: MAX cues needed to sequence through UB dressing with OH shirt. Overall MODA provided, placed pt in front of mirror today to assist with visual perception LB dressing: donned pants from EOB today using stedy to pull pants to waist line, overall MAX A to don pants.  Footwear: donned shoes/socks/AFO with total A for time mgmt  Bathing: pt able to  bathe LB today from EOB with MODA for dynamic sitting balance, pt declined UB bathing Transfers: dependent transfers in stedy                Ended session with pt seated in TIS with all needs within reach and daughter present.       Session 2: Pt greeted seated in TIS, pt agreeable to OT intervention.      NMR: session focused on providing NMES to L wrist extensors at the below settings. Noted great feedback during treatment with pt able to achieve full digit/wrist extension. No pain or adverse reactions noted. Issued pt compliant sponge with pt instructed to attempt to squeeze sponge when channel was off and then release sponge when channel was activated. Pt unable to achieve any trace movement during digit flexion attempts.     Ratio 1:3 Rate 35 pps Waveform- Asymmetric Ramp 1.0 Pulse 300 Intensity- 18  Duration -   20 mins                  Ended session with pt tilted  in TIS with all needs within reach and safety belt alarm activated.                   Therapy Documentation Precautions:  Precautions Precautions: Fall Recall of Precautions/Restrictions: Impaired Restrictions Weight Bearing Restrictions Per Provider Order: No   Therapy/Group: Individual Therapy  Ronal Gift [facility] 05/18/2024, 12:08 PM

## 2024-05-18 NOTE — Progress Notes (Signed)
 Physical Therapy Weekly Progress Note  Patient Details  Name: ZHARA GIESKE MRN: [id number] Date of Birth: October 03, 1965  Beginning of progress report period: May 10, 2024 End of progress report period: May 18, 2024  Patient has met 4 of 4 short term goals. Pt is making functional progress towards LTG's by meeting this weeks STG's set forth by evaluating PT. Pt has initiated gait training with RHR and overall mod/heavy modA. Pt has initiated squat pivot transfers with mod/maxA to the R (maxA to the L). Pt has improved push to the L, and continues to present when becoming fatigued, mostly with R LE increasing in knee extension. Pt L LE presents currently with increased tone. Pt and pt family aware of current d/c date with pt likely requiring minA. PTA discussed ramp access to pt's daughter home.   Patient continues to demonstrate the following deficits {impairments:3041632} and therefore will continue to benefit from skilled PT intervention to increase functional independence with mobility.  Patient {LTG progression:3041653}.  {plan of rjmz:6958345}  PT Short Term Goals Week 1:  PT Short Term Goal 1 (Week 1): Pt will transfer sup to sit w/ max A x 1. PT Short Term Goal 1 - Progress (Week 1): Met PT Short Term Goal 2 (Week 1): Pt will transfer bed <> w/c w/ max A x 1 consistently, SB or SPT. PT Short Term Goal 2 - Progress (Week 1): Met PT Short Term Goal 3 (Week 1): PT to assess gait. PT Short Term Goal 3 - Progress (Week 1): Met PT Short Term Goal 4 (Week 1): Pt to maintain seated balance w/o trunk support x 10' w/ min A. PT Short Term Goal 4 - Progress (Week 1): Met  Skilled Therapeutic Interventions/Progress Updates:      Therapy Documentation Precautions:  Precautions Precautions: Fall Recall of Precautions/Restrictions: Impaired Restrictions Weight Bearing Restrictions Per Provider Order: No  Dominic A Sandoval 05/18/2024, 12:52 PM

## 2024-05-18 NOTE — Patient Care Conference (Signed)
 Inpatient RehabilitationTeam Conference and Plan of Care Update Date: 05/18/2024   Time: 10:15 AM    Patient Name: [name]      Medical Record Number: [id number]  Date of Birth: November 17, 1965 Sex: Female         Room/Bed: 4W22C/4W22C-01 Payor Info: Payor: [place] MEDICAID PREPAID HEALTH PLAN / Plan: [place] MEDICAID [employer] / Product Type: *No Product type* /    Admit Date/Time:  05/09/2024  5:00 PM  Primary Diagnosis:  Acute right ACA stroke [facility])  Hospital Problems: Principal Problem:   Acute right ACA stroke (HCC) Active Problems:   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 Prentice Compton Social Worker Present: Rhoda Clement, LCSW Nurse Present: Barnie Ronde, RN PT Present: Sherlean Perks, PT;Dominic Sandoval, PTA OT Present: Katheryn Mines, OT SLP Present: Blaise Alderman, SLP PPS Coordinator present : Eleanor Colon, SLP     Current Status/Progress Goal Weekly Team Focus  Bowel/Bladder      Mixed continence of bladder   Continent of bowel and bladder     Toileting protocol  Swallow/Nutrition/ Hydration   D1/thin   minA  trials of advanced textures, carry over of strategies, ensuring diet tolerance and full supervision    ADL's   mod A UB self care, max to total LB self care. Pt with much improved sitting balance to enable her to sit on toilet and tub bench with close CGA/ min A.  using stedy for transfers with min/ mod A   min A   LUE NMR, L side awareness, visual scanning, postural control and balance, pt/fam education    Mobility   MaxA bed mobility, minA sit to stand (mod/maxA to stabilize), modA to ambulate 30' with R hand rail (maxA for L LE advancement)   overall minA  sitting balance, midline  orientation, l NMRE, bed mobility, transfers, ambulation, static standing balance    Communication                Safety/Cognition/ Behavioral Observations  modA for verbal tasks, maxA for visual due to severe L inattention   supervision-minA   L inattention for completion of cognitive tasks    Pain       N/a           Skin    N/A            Discharge Planning:  Family here daily and has seen Mom in therapy sessions. Hoping for the best and will do better than goals.   Team Discussion: Patient admitted post right ACA/MCA with dense left sided weakness and tone on left side with flexor withdrawal. Progress limited by decreased midline awareness, truncal control but carry over has improved.  Patient on target to meet rehab goals: yes, currently needs mod assist for upper body care and max assist for lower body care. Needs min assist for sit - stand but mod - max assist to maintain stability once up. Goals for discharge set for min assist overall.  *See Care Plan and progress notes for long and short-term goals.   Revisions to Treatment Plan:  Neuro psych referral   Teaching Needs: Safety, medications, dietary modification, transfers, toileting, etc.   Current Barriers to Discharge: Decreased caregiver support and Home enviroment access/layout  Possible Resolutions to  Barriers: Family education Ramp for entry to home     Medical Summary Current Status: Still with dense left hemiparesis, L neglect improving , LLE pain  Barriers to Discharge: Medical stability;Morbid Obesity;Spasticity   Possible Resolutions to [employer] Focus: cont spasticity management, avoid sedating medications   Continued Need for Acute Rehabilitation Level of Care: The patient requires daily medical management by a physician with specialized training in physical medicine and rehabilitation for the following reasons: Direction of a multidisciplinary physical rehabilitation program to  maximize functional independence : Yes Medical management of patient stability for increased activity during participation in an intensive rehabilitation regime.: Yes Analysis of laboratory values and/or radiology reports with any subsequent need for medication adjustment and/or medical intervention. : Yes   I attest that I was present, lead the team conference, and concur with the assessment and plan of the team.   Fredericka Sober B 05/18/2024, 1:33 PM

## 2024-05-18 NOTE — Plan of Care (Signed)
  Problem: Consults Goal: RH STROKE PATIENT EDUCATION Description: See Patient Education module for education specifics  Outcome: Progressing Goal: Nutrition Consult-if indicated Outcome: Progressing Goal: Diabetes Guidelines if Diabetic/Glucose > 140 Description: If diabetic or lab glucose is > 140 mg/dl - Initiate Diabetes/Hyperglycemia Guidelines & Document Interventions  Outcome: Progressing   Problem: RH BOWEL ELIMINATION Goal: RH STG MANAGE BOWEL WITH ASSISTANCE Description: STG Manage Bowel with mod I Assistance. Outcome: Progressing Goal: RH STG MANAGE BOWEL W/MEDICATION W/ASSISTANCE Description: STG Manage Bowel with Medication with mod I Assistance. Outcome: Progressing   Problem: RH SAFETY Goal: RH STG ADHERE TO SAFETY PRECAUTIONS W/ASSISTANCE/DEVICE Description: STG Adhere to Safety Precautions With cues Assistance/Device. Outcome: Progressing   Problem: RH KNOWLEDGE DEFICIT Goal: RH STG INCREASE KNOWLEDGE OF DIABETES Description: Patient and family will be able to manage DM using educational resources for medications and dietary modification independently Outcome: Progressing Goal: RH STG INCREASE KNOWLEDGE OF HYPERTENSION Description: Patient and family will be able to manage HTN using educational resources for medications and dietary modification independently Outcome: Progressing Goal: RH STG INCREASE KNOWLEDGE OF DYSPHAGIA/FLUID INTAKE Description: Patient and family will be able to manage Dysphagia using educational resources for medications and dietary modification independently Outcome: Progressing Goal: RH STG INCREASE KNOWLEGDE OF HYPERLIPIDEMIA Description: Patient and family will be able to manage HLD using educational resources for medications and dietary modification independently Outcome: Progressing Goal: RH STG INCREASE KNOWLEDGE OF STROKE PROPHYLAXIS Description: Patient and family will be able to manage secondary risks using educational resources  for medications and dietary modification independently Outcome: Progressing   Problem: Education: Goal: Ability to describe self-care measures that may prevent or decrease complications (Diabetes Survival Skills Education) will improve Outcome: Progressing Goal: Individualized Educational Video(s) Outcome: Progressing   Problem: Coping: Goal: Ability to adjust to condition or change in health will improve Outcome: Progressing   Problem: Fluid Volume: Goal: Ability to maintain a balanced intake and output will improve Outcome: Progressing   Problem: Health Behavior/Discharge Planning: Goal: Ability to identify and utilize available resources and services will improve Outcome: Progressing Goal: Ability to manage health-related needs will improve Outcome: Progressing

## 2024-05-19 DIAGNOSIS — I63521 Cerebral infarction due to unspecified occlusion or stenosis of right anterior cerebral artery: Secondary | ICD-10-CM | POA: Diagnosis not present

## 2024-05-19 LAB — CBC
HCT: 42.6 % (ref 36.0–46.0)
Hemoglobin: 13.7 g/dL (ref 12.0–15.0)
MCH: 26.8 pg (ref 26.0–34.0)
MCHC: 32.2 g/dL (ref 30.0–36.0)
MCV: 83.2 fL (ref 80.0–100.0)
Platelets: 253 K/uL (ref 150–400)
RBC: 5.12 MIL/uL — ABNORMAL HIGH (ref 3.87–5.11)
RDW: 13.7 % (ref 11.5–15.5)
WBC: 10.9 K/uL — ABNORMAL HIGH (ref 4.0–10.5)
nRBC: 0 % (ref 0.0–0.2)

## 2024-05-19 LAB — GLUCOSE, CAPILLARY: Glucose-Capillary: 105 mg/dL — ABNORMAL HIGH (ref 70–99)

## 2024-05-19 NOTE — Progress Notes (Signed)
 Physical Therapy Session Note  Patient Details  Name: [name] MRN: [id number] Date of Birth: 1965/11/05  Today's Date: 05/19/2024 PT Individual Time: 8582-8472 PT Individual Time Calculation (min): 70 min   Short Term Goals: Week 1:  PT Short Term Goal 1 (Week 1): Pt will transfer sup to sit w/ max A x 1. PT Short Term Goal 1 - Progress (Week 1): Met PT Short Term Goal 2 (Week 1): Pt will transfer bed <> w/c w/ max A x 1 consistently, SB or SPT. PT Short Term Goal 2 - Progress (Week 1): Met PT Short Term Goal 3 (Week 1): PT to assess gait. PT Short Term Goal 3 - Progress (Week 1): Met PT Short Term Goal 4 (Week 1): Pt to maintain seated balance w/o trunk support x 10' w/ min A. PT Short Term Goal 4 - Progress (Week 1): Met  Skilled Therapeutic Interventions/Progress Updates: Patient sitting in TIS with daughter present on entrance to room. Patient alert and agreeable to PT session.   Patient reported no pain, only upset stomach due to having to receive laxative.   Therapeutic Activity: Transfers: Pt performed sit<>stands throughout session with minA in preparation for NMRE (maxA to stabilize safely). Provided VC for hand/LE placement.  Neuromuscular Re-ed: - Muscle Energy Technique to promote stretch reflex and increased knee flexor/extensor (alternating) activation for improved strength. Pt also cued to place R UE on L thigh while PTA passively moved LE through extension/flexion to decrease tone - Pt cued to perform L LAQ/flexion sitting in TIS with min/modA into extension, and maxA for flexors - L hip flexion seated position with noted increase in hip flexor activation. Cues to increase hip flexion with PTA providing maxA after few degrees of flexion. Pt then performed alternating between available hip flexion L LE till PTA provided mod/maxA, then cued to press down into hip extension into PTA hand.  - Pt ambulated 3 musketeer method with TIS follow and overall maxA. Pt with noted  increase in hip flexion this session vs previous, pt also with increase in L tone despite performing following above NMRE. Pt required weight shift facilitation and cues to increase step length on R LE. Pt required maxA to increase L knee extension and to prevent buckling during stance phase in order so adequately advance R LE. 26' - Pt performed static stance x 2  with cues to maintain soft bend in R Knee (noted improvement in ability to hold for few seconds longer, and pt presented with decrease in push to L from R LE locking in extension). Pt required maxA to maintain L knee in extension.  NMR performed for improvements in motor control and coordination, balance, sequencing, judgement, and self confidence/ efficacy in performing all aspects of mobility at highest level of independence.   Manual Therapy  - Soft tissue mobilization performed to L plantarflexors and distal knee flexors due to pt report of increase in discomfort from tone. Pt reported decrease in pain following.   Patient sitting in TIS at end of session with brakes locked, belt alarm set, and all needs within reach.      Therapy Documentation Precautions:  Precautions Precautions: Fall Recall of Precautions/Restrictions: Impaired Restrictions Weight Bearing Restrictions Per Provider Order: No   Therapy/Group: Individual Therapy  Louretta Tantillo PTA 05/19/2024, 3:34 PM

## 2024-05-19 NOTE — Plan of Care (Signed)
  Problem: Consults Goal: RH STROKE PATIENT EDUCATION Description: See Patient Education module for education specifics  Outcome: Progressing Goal: Nutrition Consult-if indicated Outcome: Progressing Goal: Diabetes Guidelines if Diabetic/Glucose > 140 Description: If diabetic or lab glucose is > 140 mg/dl - Initiate Diabetes/Hyperglycemia Guidelines & Document Interventions  Outcome: Progressing   Problem: RH BOWEL ELIMINATION Goal: RH STG MANAGE BOWEL WITH ASSISTANCE Description: STG Manage Bowel with mod I Assistance. Outcome: Progressing Goal: RH STG MANAGE BOWEL W/MEDICATION W/ASSISTANCE Description: STG Manage Bowel with Medication with mod I Assistance. Outcome: Progressing   Problem: RH SAFETY Goal: RH STG ADHERE TO SAFETY PRECAUTIONS W/ASSISTANCE/DEVICE Description: STG Adhere to Safety Precautions With cues Assistance/Device. Outcome: Progressing   Problem: RH KNOWLEDGE DEFICIT Goal: RH STG INCREASE KNOWLEDGE OF DIABETES Description: Patient and family will be able to manage DM using educational resources for medications and dietary modification independently Outcome: Progressing Goal: RH STG INCREASE KNOWLEDGE OF HYPERTENSION Description: Patient and family will be able to manage HTN using educational resources for medications and dietary modification independently Outcome: Progressing Goal: RH STG INCREASE KNOWLEDGE OF DYSPHAGIA/FLUID INTAKE Description: Patient and family will be able to manage Dysphagia using educational resources for medications and dietary modification independently Outcome: Progressing Goal: RH STG INCREASE KNOWLEGDE OF HYPERLIPIDEMIA Description: Patient and family will be able to manage HLD using educational resources for medications and dietary modification independently Outcome: Progressing Goal: RH STG INCREASE KNOWLEDGE OF STROKE PROPHYLAXIS Description: Patient and family will be able to manage secondary risks using educational resources  for medications and dietary modification independently Outcome: Progressing   Problem: Education: Goal: Ability to describe self-care measures that may prevent or decrease complications (Diabetes Survival Skills Education) will improve Outcome: Progressing Goal: Individualized Educational Video(s) Outcome: Progressing   Problem: Coping: Goal: Ability to adjust to condition or change in health will improve Outcome: Progressing   Problem: Fluid Volume: Goal: Ability to maintain a balanced intake and output will improve Outcome: Progressing   Problem: Health Behavior/Discharge Planning: Goal: Ability to identify and utilize available resources and services will improve Outcome: Progressing Goal: Ability to manage health-related needs will improve Outcome: Progressing   Problem: Metabolic: Goal: Ability to maintain appropriate glucose levels will improve Outcome: Progressing   Problem: Nutritional: Goal: Maintenance of adequate nutrition will improve Outcome: Progressing Goal: Progress toward achieving an optimal weight will improve Outcome: Progressing   Problem: Skin Integrity: Goal: Risk for impaired skin integrity will decrease Outcome: Progressing   Problem: Tissue Perfusion: Goal: Adequacy of tissue perfusion will improve Outcome: Progressing

## 2024-05-19 NOTE — Progress Notes (Signed)
 Speech Language Pathology Daily Session Note  Patient Details  Name: [name] MRN: [id number] Date of Birth: March 02, 1966  Today's Date: 05/19/2024 SLP Individual Time: 1230-1330 SLP Individual Time Calculation (min): 60 min  Short Term Goals: Week 2: SLP Short Term Goal 1 (Week 2): Patient will consume least restrictive diet with use of swallowing compensatory strategies given supervision multimodal A SLP Short Term Goal 2 (Week 2): Patient will demonstrate orientation to self, location, situation and time given min multimodal A SLP Short Term Goal 3 (Week 2): Patient will demonstrate attention to the L during functional tasks given mod multimodal A SLP Short Term Goal 4 (Week 2): Patient will recall activities completed in prior therapy sessions given mod multimodal A  Skilled Therapeutic Interventions: Skilled therapy session focused on dysphagia and cognitive goals. SLP facilitated session by observing patient with D1/thin liquid lunch tray. Patient continues to present with minimal PO consumption, though with adequate oral clearance and swallow initiation. No s/sx of aspiration observed. SLP observed patient with upgraded D3 textures. Patient with prolonged mastication and mild oral residuals cleared with liquid wash. Recommend upgrade to D2 textures. SLP targeted cognitive goals through L attention task. SLP prompted patient to place beads in pill box according to verbalized directions. Patient required maxA to place beads in all seven days. Patient benefitted from visual anchor and assistance opening each slot. Due to difficulty of medication task, SLP branched down and provided minA for patient to sort beads according to color. Patient left in chair with alarm set and call bell in reach. Continue POC.   Pain denies  Therapy/Group: Individual Therapy  [name] M.A., CCC-SLP 05/19/2024, 7:43 AM

## 2024-05-19 NOTE — Progress Notes (Signed)
 PROGRESS NOTE   Subjective/Complaints: No leg pain , discussed metoprolol  with LPN  Occ frontal HA , no sinus issues, has this at home as well  ROS: Limited due to cognitive/behavioral but denies breathing , bladder and bowel issues, denies incont  despite mult documented episodes of bladder incont    Objective:   No results found. Recent Labs    05/19/24 0519  WBC 10.9*  HGB 13.7  HCT 42.6  PLT 253    Recent Labs    05/16/24 1657  NA 137  K 4.3  CL 102  CO2 21*  GLUCOSE 113*  BUN 11  CREATININE 1.10*  CALCIUM  10.0     Intake/Output Summary (Last 24 hours) at 05/19/2024 0833 Last data filed at 05/19/2024 0618 Gross per 24 hour  Intake 518 ml  Output --  Net 518 ml        Physical Exam: Vital Signs Blood pressure 122/68, pulse 83, temperature 97.6 F (36.4 C), resp. rate 20, height 5' 3.5 (1.613 m), weight 88.6 kg, SpO2 99%.   General: No acute distress Mood and affect are appropriate Heart: Regular rate and rhythm no rubs murmurs or extra sounds Lungs: Clear to auscultation, breathing unlabored, no rales or wheezes Abdomen: Positive bowel sounds, soft nontender to palpation, nondistended Extremities: No clubbing, cyanosis, or edema Skin: No evidence of breakdown, no evidence of rash Neurologic: Cranial nerves II through XII intact, motor strength is 5/5 in bilateral deltoid, bicep, tricep, grip, hip flexor, knee extensors, ankle dorsiflexor and plantar flexor   Neuro:  much more alert today. . Right gaze preference ongoing but can be cued to left verbally. Follows basic commands. Voice stronger, speech sl dysarthric, left central VII. MMT:LUE 0/5. perhaps trace Left elbow flexion and Hip Ext LLE when extended  but otherwise 0/5 . Moves right side freely. Senses pain and light touch in all 4's. LUE DTR's 1+. LLE DTR's brisk 3+ with early extensor tone, toes up. Increasing left pectoralis and elbow  flexor tone MAS 2/3  Prior neuro assessment is c/w 05/19/2024 exam.  + triple flexor Musculoskeletal: no pain with AROM/PROM  No swelling LLE, no hamstrings or adductor pain    Assessment/Plan: 1. Functional deficits which require 3+ hours per day of interdisciplinary therapy in a comprehensive inpatient rehab setting. Physiatrist is providing close team supervision and 24 hour management of active medical problems listed below. Physiatrist and rehab team continue to assess barriers to discharge/monitor patient progress toward functional and medical goals  Care Tool:  Bathing    Body parts bathed by patient: Abdomen, Front perineal area, Left arm, Face, Chest, Right upper leg, Left upper leg   Body parts bathed by helper: Right lower leg, Left lower leg, Right arm     Bathing assist Assist Level: Moderate Assistance - Patient 50 - 74%     Upper Body Dressing/Undressing Upper body dressing   What is the patient wearing?: Pull over shirt    Upper body assist Assist Level: Moderate Assistance - Patient 50 - 74%    Lower Body Dressing/Undressing Lower body dressing      What is the patient wearing?: Incontinence brief, Pants  Lower body assist Assist for lower body dressing: Total Assistance - Patient < 25%     Toileting Toileting    Toileting assist Assist for toileting: 2 Helpers     Transfers Chair/bed transfer  Transfers assist  Chair/bed transfer activity did not occur: Safety/medical concerns  Chair/bed transfer assist level: Dependent - mechanical lift (stedy)     Locomotion Ambulation   Ambulation assist   Ambulation activity did not occur: Safety/medical concerns  Assist level: 2 helpers Assistive device: Other (comment) (R hand rail\) Max distance: 19ft   Walk 10 feet activity   Assist  Walk 10 feet activity did not occur: Safety/medical concerns  Assist level: 2 helpers Assistive device: Other (comment) (R hand rail)   Walk 50 feet  activity   Assist Walk 50 feet with 2 turns activity did not occur: Safety/medical concerns         Walk 150 feet activity   Assist Walk 150 feet activity did not occur: Safety/medical concerns         Walk 10 feet on uneven surface  activity   Assist Walk 10 feet on uneven surfaces activity did not occur: Safety/medical concerns         Wheelchair     Assist Is the patient using a wheelchair?: Yes Type of Wheelchair: Manual    Wheelchair assist level: Dependent - Patient 0% Max wheelchair distance: 150    Wheelchair 50 feet with 2 turns activity    Assist        Assist Level: Dependent - Patient 0%   Wheelchair 150 feet activity     Assist      Assist Level: Dependent - Patient 0%   Blood pressure 122/68, pulse 83, temperature 97.6 F (36.4 C), resp. rate 20, height 5' 3.5 (1.613 m), weight 88.6 kg, SpO2 99%.  Medical Problem List and Plan: 1. 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 Pt is s/p IR w TICI2b. 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  Antithrombotics: -DVT/anticoagulation:  Pharmaceutical: Xarelto              -antiplatelet therapy: N/A 3. Pain Management: Tylenol  prn for pain              --Has been on topamax  for HA in the past.   -pain seems controlled for the most part. Would like to stay with tylenol  if possible given arousal 4. Mood/Behavior/Sleep: LCSW to follow for evaluaton and support.              --trazodone  prn for insomnia.              -antipsychotic agents: N/A 5. Neuropsych/cognition: This patient is capable of making decisions on her own behalf.  Cont amantadine  9/4     6. Skin/Wound Care: Routine pressure relief measures.  7. Fluids/Electrolytes/Nutrition:  Monitor I/O. Add CM restrictions to diet             -Post stroke Dysphagia --SLP following. D2 diet/thins            -encourage PO, protein supp for low  albumin  8. A fib: Monitor HR TID-- Metoprolol  succinate 200 mg daily  --Continue Xarelto .  9. HTN: Monitor BP TID. On metoprolol  and aldactone  Vitals:   05/18/24 1935 05/19/24 0548  BP: 113/79 122/68  Pulse: 75 83  Resp: 16 20  Temp: (!) 97.4 F (36.3 C) 97.6 F (36.4 C)  SpO2: 100% 99%    10. NICM w/HFrEF: Discussed with Cardiology Dr. Maceo. Digoxin  and metoprolol  tartrate was stooped. Entresto  not continued. She is continued on spironolactone  and Metoprolol  succinate 200mg  daily in the AM. Trying to keep regimen simple. Appreciate the follow up 11.  Pre-diabetes: had been On Farxiga . Added CM restrictions to diet.  --Monitor BS ac/hs and use SSI tor elevated BS.  CBG (last 3)  Recent Labs    05/17/24 0625 05/18/24 0629 05/19/24 0606  GLUCAP 103* 100* 105*  Controlled 9/4 only daily checks 12. HLD- LDL 109 crestor  40 mg daily  13. Obesity:  BMI 34.84 educate on diet and weight loss to promote overall health and mobility.  14. Leukocytosis: no s/s infection WBC 14.2--13.9--11.9   -9/1 sl elevation today to 13.6   -continue to monitor   15. Slow trans constipation:   -miralax  scheduled with prns  -last bm 8/30  LOS: 10 days A FACE TO FACE EVALUATION WAS PERFORMED  Prentice FORBES Compton 05/19/2024, 8:33 AM

## 2024-05-19 NOTE — Progress Notes (Signed)
 Occupational Therapy Session Note  Patient Details  Name: [name] MRN: [id number] Date of Birth: Dec 09, 1965  Today's Date: 05/19/2024 OT Individual Time: 0949-1100 OT Individual Time Calculation (min): 71 min    Short Term Goals: Week 2:  OT Short Term Goal 1 (Week 2): Pt will complete squat pivot toilet transfers with mod A or less. OT Short Term Goal 2 (Week 2): Pt will be able to hold static stand with min A to enable caregiver to adjust clothing over hips. OT Short Term Goal 3 (Week 2): pt will be able to reach feet with long sponge to wash feet. OT Short Term Goal 4 (Week 2): pt will complete self ROM for LUE with min A.  Skilled Therapeutic Interventions/Progress Updates:  Pt greeted seated in TIS, pt agreeable to OT intervention.      Transfers/bed mobility/functional mobility: pt completed several sit>stands in stedy with MODA+1, dependent ADL transfers in stedy. Pt able to sit>stand with MODA +1 in shower and during dressing tasks.   ADLs:  UB dressing:pt donned [place] shirt with MODA, improved recall of  hemi technique this session LB dressing: donned brief with total A for time mgmt   Bathing: pt completed bathing seated on TTB with a focus on dynamic sitting balance with overall MODA. Assist needed to wash RUE and under LUE. Pt even able to stand in shower with MODA +1 while pt washed posterior pericare.  Transfers: dependent transfer in stedy to shower and toilet   Toileting: pt experiencing constipation needing increased time on toilet                Ended session with pt seated in TIS with all needs within reach and daughter present.   Therapy Documentation Precautions:  Precautions Precautions: Fall Recall of Precautions/Restrictions: Impaired Restrictions Weight Bearing Restrictions Per Provider Order: No  Pain: No pain reported during session.   Therapy/Group: Individual Therapy  Ronal Gift [facility] 05/19/2024, 12:06 PM

## 2024-05-20 DIAGNOSIS — I63521 Cerebral infarction due to unspecified occlusion or stenosis of right anterior cerebral artery: Secondary | ICD-10-CM | POA: Diagnosis not present

## 2024-05-20 LAB — GLUCOSE, CAPILLARY: Glucose-Capillary: 90 mg/dL (ref 70–99)

## 2024-05-20 NOTE — Progress Notes (Signed)
 Patient ID: [name], female   DOB: [birth year], 58 y.o.   MRN: [id number] Met with the patient to review current medical situation, rehab process, team conference and plan of care. Discussed secondary risk management including HF, A-fib, DM, HLD and previous stroke. Reviewed medications and dietary modification recommendations. Reviewed MyChart access and bringing in  FMLA paperwork for her employer.  Continue to follow along to address educational needs to facilitate preparation for discharge. Fredericka Barnie NOVAK

## 2024-05-20 NOTE — Progress Notes (Signed)
 Occupational Therapy Session Note  Patient Details  Name: [name] MRN: [id number] Date of Birth: [birth year]  Today's Date: 05/20/2024 OT Individual Time: 8691-8669 OT Individual Time Calculation (min): 22 min    Short Term Goals: Week 2:  OT Short Term Goal 1 (Week 2): Pt will complete squat pivot toilet transfers with mod A or less. OT Short Term Goal 2 (Week 2): Pt will be able to hold static stand with min A to enable caregiver to adjust clothing over hips. OT Short Term Goal 3 (Week 2): pt will be able to reach feet with long sponge to wash feet. OT Short Term Goal 4 (Week 2): pt will complete self ROM for LUE with min A.  Skilled Therapeutic Interventions/Progress Updates:  Pt greeted asleep in supine but easily able to arouse, pt agreeable to OT intervention.      NMR:  Provided PROM to LUE to decrease tone at elbow.   Donned Zynex NMES on L wrist flexors at the below settings, focused on functional grasp and release with wrist flexion with pt instructed to grasp compliant cube when channel was activated. No pain or adverse reactions noted during/after session.     Ratio 1:3 Rate 35 pps Waveform- Asymmetric Ramp 1.0 Pulse 300 Intensity- 13  Duration -   10 mins  Ended session with pt supine in bed with all needs within reach and bed alarm activated.          Direct hand off to SLP for next session.        '   Therapy Documentation Precautions:  Precautions Precautions: Fall Recall of Precautions/Restrictions: Impaired Restrictions Weight Bearing Restrictions Per Provider Order: No  Pain:no pain reported during session.     Therapy/Group: Individual Therapy  Ronal Mallie Needy 05/20/2024, 1:41 PM

## 2024-05-20 NOTE — Progress Notes (Signed)
 Speech Language Pathology Daily Session Note  Patient Details  Name: [name] MRN: [id number] Date of Birth: 1965-10-01  Today's Date: 05/20/2024 SLP Individual Time: 1330-1430 SLP Individual Time Calculation (min): 60 min  Short Term Goals: Week 2: SLP Short Term Goal 1 (Week 2): Patient will consume least restrictive diet with use of swallowing compensatory strategies given supervision multimodal A SLP Short Term Goal 2 (Week 2): Patient will demonstrate orientation to self, location, situation and time given min multimodal A SLP Short Term Goal 3 (Week 2): Patient will demonstrate attention to the L during functional tasks given mod multimodal A SLP Short Term Goal 4 (Week 2): Patient will recall activities completed in prior therapy sessions given mod multimodal A  Skilled Therapeutic Interventions: Skilled therapy session focused on cognitive goals. SLP facilitated session by prompting patient to seperate and count change according to verbalized amounts. Patient required modA to seperate change due to L inattention. SLP then prompted patient to count change. Patient able to verbalize correct answers w/ supervisionA (saying you can make 17 cents with a dime, nickel and two pennies) however with increased difficulty locating items on the table. SLP continued to target problem solving and L attention through calendar task. Patient was prompted to locate various important dates on the calendar including todays date, her birthday and discharge date. Patient required min-modA to complete this task and use of visual colored anchor. Patient left in bed with alarm set and call bell in reach. Continue POC.  Pain None reported   Therapy/Group: Individual Therapy  Sherena Machorro M.A., CCC-SLP 05/20/2024, 7:43 AM

## 2024-05-20 NOTE — Progress Notes (Signed)
 Physical Therapy Session Note  Patient Details  Name: [name] MRN: [id number] Date of Birth: [birth year]  Today's Date: 05/20/2024 PT Individual Time: 0851-1002 PT Individual Time Calculation (min): 71 min   Short Term Goals: Week 1:  PT Short Term Goal 1 (Week 1): Pt will transfer sup to sit w/ max A x 1. PT Short Term Goal 1 - Progress (Week 1): Met PT Short Term Goal 2 (Week 1): Pt will transfer bed <> w/c w/ max A x 1 consistently, SB or SPT. PT Short Term Goal 2 - Progress (Week 1): Met PT Short Term Goal 3 (Week 1): PT to assess gait. PT Short Term Goal 3 - Progress (Week 1): Met PT Short Term Goal 4 (Week 1): Pt to maintain seated balance w/o trunk support x 10' w/ min A. PT Short Term Goal 4 - Progress (Week 1): Met  Skilled Therapeutic Interventions/Progress Updates: Patient supine in bed on entrance to room. Patient alert and agreeable to PT session.   Patient reported no pain during session.   Therapeutic Activity: Bed Mobility: Pt maxA to donn personal pants supine in bed, and modA to roll to L, and maxA to roll to R with VC for bridging self over to L and use of R UE to assist on HOB railing. Pt performed supine<sit on EOB with maxA and VC to increase R lateral abdominal flexors by reaching for foot of bed railing with R UE. Pt sitting EOB with maxA to doff/donn personal shirt (new shirt covering over old shirt to protect pt's modesty) with +2 present throughout.  Transfers: Pt performed squat pivot transfer from EOB<TIS ate beginning of session with heavy mod/maxA and VC for sequence. Pt in day room gym attempting to perform mass practice of squat pivot transfers following NRME, however, pt's tone in L LE and R LE pushing into extension caused pt to want to stand up every time vs pivoting despite multimodal cuing. Pt attempted few times to the R, then to the L with each time standing vs pivoting. Pt then guided through stand pivot transfer maxA + 2 with L knee block and VC  for step placement and upright posture. Pt then performed squat pivot to the R with ability to do so when having arm rest to pull self over with vs pushing with R UE due to standing bias with mechanics of set up.   Neuromuscular Re-ed: NMR facilitated during session with focus on proprioceptive feedback, weight shift, neuromuscular connection. - Muscle Energy Technique to promote stretch reflex and increased L quadriceps activation for improved strength and neuromuscular connection. Noted increase in ROM following mass practice - pt cued to press into PTA hand with L foot into hip extension (seated) with PTA following with maxA into hip flexion with pt able to activate briefly. Mass practice performed - Pt ambulated roughly 54' with RHR outside of main gym. Pt with L AFO and shoe cap donned. Pt required overall modA to maintain standing balance, and maxA to advance L LE through gait cycle with some hip compensation, and to weight shift accordingly. Pt cued to increase step length on R LE. Pt with improved upright standing posture this session. Pt stood from TIS to [facility] withCGA/close supervision and able to stabilize without intervention!  NMR performed for improvements in motor control and coordination, balance, sequencing, judgement, and self confidence/ efficacy in performing all aspects of mobility at highest level of independence.   Patient sitting in recliner at end of session  with brakes locked, daughter present and all needs within reach.      Therapy Documentation Precautions:  Precautions Precautions: Fall Recall of Precautions/Restrictions: Impaired Restrictions Weight Bearing Restrictions Per Provider Order: No   Therapy/Group: Individual Therapy  Abbee Cremeens PTA 05/20/2024, 12:13 PM

## 2024-05-20 NOTE — Progress Notes (Signed)
 PROGRESS NOTE   Subjective/Complaints:  No pain c/os, discussed spasticity and management  ROS: Limited due to cognitive/behavioral but denies breathing , bladder and bowel issues, denies incont  despite mult documented episodes of bladder incont    Objective:   No results found. Recent Labs    05/19/24 0519  WBC 10.9*  HGB 13.7  HCT 42.6  PLT 253    No results for input(s): NA, K, CL, CO2, GLUCOSE, BUN, CREATININE, CALCIUM  in the last 72 hours.    Intake/Output Summary (Last 24 hours) at 05/20/2024 0814 Last data filed at 05/19/2024 1800 Gross per 24 hour  Intake 720 ml  Output --  Net 720 ml        Physical Exam: Vital Signs Blood pressure 114/77, pulse 77, temperature 98.1 F (36.7 C), resp. rate 16, height 5' 3.5 (1.613 m), weight 88.6 kg, SpO2 98%.   General: No acute distress Mood and affect are appropriate Heart: Regular rate and rhythm no rubs murmurs or extra sounds Lungs: Clear to auscultation, breathing unlabored, no rales or wheezes Abdomen: Positive bowel sounds, soft nontender to palpation, nondistended Extremities: No clubbing, cyanosis, or edema Skin: No evidence of breakdown, no evidence of rash Neurologic: Cranial nerves II through XII intact, motor strength is 5/5 in R deltoid, bicep, tricep, grip, hip flexor, knee extensors, ankle dorsiflexor and plantar flexor   Neuro:  much more alert today. . Right gaze preference ongoing but can be cued to left verbally. Follows basic commands. Voice stronger, speech sl dysarthric, left central VII. MMT:LUE 0/5. perhaps trace Left elbow flexion and Hip Ext LLE when extended  but otherwise 0/5 . Moves right side freely. Senses pain and light touch in all 4's. LUE DTR's 1+. LLE DTR's brisk 3+ with early extensor tone, toes up. Increasing left pectoralis and elbow flexor tone MAS 2/3  Prior neuro assessment is c/w 05/20/2024 exam.  + triple  flexor Musculoskeletal: no pain with AROM/PROM  No swelling LLE, no hamstrings or adductor pain    Assessment/Plan: 1. Functional deficits which require 3+ hours per day of interdisciplinary therapy in a comprehensive inpatient rehab setting. Physiatrist is providing close team supervision and 24 hour management of active medical problems listed below. Physiatrist and rehab team continue to assess barriers to discharge/monitor patient progress toward functional and medical goals  Care Tool:  Bathing    Body parts bathed by patient: Abdomen, Front perineal area, Left arm, Face, Chest, Right upper leg, Left upper leg   Body parts bathed by helper: Right lower leg, Left lower leg, Right arm     Bathing assist Assist Level: Moderate Assistance - Patient 50 - 74%     Upper Body Dressing/Undressing Upper body dressing   What is the patient wearing?: Pull over shirt    Upper body assist Assist Level: Moderate Assistance - Patient 50 - 74%    Lower Body Dressing/Undressing Lower body dressing      What is the patient wearing?: Incontinence brief, Pants     Lower body assist Assist for lower body dressing: Total Assistance - Patient < 25%     Toileting Toileting    Toileting assist Assist for toileting: 2  Helpers     Transfers Chair/bed transfer  Transfers assist  Chair/bed transfer activity did not occur: Safety/medical concerns  Chair/bed transfer assist level: Dependent - mechanical lift (stedy)     Locomotion Ambulation   Ambulation assist   Ambulation activity did not occur: Safety/medical concerns  Assist level: 2 helpers Assistive device: Other (comment) (R hand rail\) Max distance: 59ft   Walk 10 feet activity   Assist  Walk 10 feet activity did not occur: Safety/medical concerns  Assist level: 2 helpers Assistive device: Other (comment) (R hand rail)   Walk 50 feet activity   Assist Walk 50 feet with 2 turns activity did not occur:  Safety/medical concerns         Walk 150 feet activity   Assist Walk 150 feet activity did not occur: Safety/medical concerns         Walk 10 feet on uneven surface  activity   Assist Walk 10 feet on uneven surfaces activity did not occur: Safety/medical concerns         Wheelchair     Assist Is the patient using a wheelchair?: Yes Type of Wheelchair: Manual    Wheelchair assist level: Dependent - Patient 0% Max wheelchair distance: 150    Wheelchair 50 feet with 2 turns activity    Assist        Assist Level: Dependent - Patient 0%   Wheelchair 150 feet activity     Assist      Assist Level: Dependent - Patient 0%   Blood pressure 114/77, pulse 77, temperature 98.1 F (36.7 C), resp. rate 16, height 5' 3.5 (1.613 m), weight 88.6 kg, SpO2 98%.  Medical Problem List and Plan: 1. 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 Pt is s/p IR w TICI2b. 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  Antithrombotics: -DVT/anticoagulation:  Pharmaceutical: Xarelto              -antiplatelet therapy: N/A 3. Pain Management: Tylenol  prn for pain              --Has been on topamax  for HA in the past.   -pain seems controlled for the most part. Would like to stay with tylenol  if possible given arousal 4. Mood/Behavior/Sleep: LCSW to follow for evaluaton and support.              --trazodone  prn for insomnia.              -antipsychotic agents: N/A 5. Neuropsych/cognition: This patient is capable of making decisions on her own behalf.  Cont amantadine  9/4     6. Skin/Wound Care: Routine pressure relief measures.  7. Fluids/Electrolytes/Nutrition: Monitor I/O. Add CM restrictions to diet             -Post stroke  Dysphagia --SLP following. D2 diet/thins            -encourage PO, protein supp for low albumin  8. A fib: Monitor HR TID-- Metoprolol  succinate 200 mg daily  --Continue Xarelto .  9. HTN: Monitor BP TID. On metoprolol  and aldactone  Vitals:   05/19/24  1951 05/20/24 0428  BP: 122/76 114/77  Pulse: 79 77  Resp: 16 16  Temp: 98.4 F (36.9 C) 98.1 F (36.7 C)  SpO2: 100% 98%    10. NICM w/HFrEF: Discussed with Cardiology Dr. Maceo. Digoxin  and metoprolol  tartrate was stooped. Entresto  not continued. She is continued on spironolactone  and Metoprolol  succinate 200mg  daily in the AM. Trying to keep regimen simple. Appreciate the follow up 11.  Pre-diabetes: had been On Farxiga . Added CM restrictions to diet.  --Monitor BS ac/hs and use SSI tor elevated BS.  CBG (last 3)  Recent Labs    05/18/24 0629 05/19/24 0606 05/20/24 0601  GLUCAP 100* 105* 90  Controlled 9/5 only daily checks 12. HLD- LDL 109 crestor  40 mg daily  13. Obesity:  BMI 34.84 educate on diet and weight loss to promote overall health and mobility.  14. Leukocytosis: no s/s infection    Latest Ref Rng & Units 05/19/2024    5:19 AM 05/16/2024    5:38 AM 05/12/2024    5:00 AM  CBC  WBC 4.0 - 10.5 K/uL 10.9  13.6  12.3   Hemoglobin 12.0 - 15.0 g/dL 86.2  85.7  85.5   Hematocrit 36.0 - 46.0 % 42.6  44.7  44.5   Platelets 150 - 400 K/uL 253  283  231      15. Slow trans constipation:   -miralax  scheduled with prns  -last bm 8/30  LOS: 11 days A FACE TO FACE EVALUATION WAS PERFORMED  [name] 05/20/2024, 8:14 AM

## 2024-05-20 NOTE — Progress Notes (Signed)
 Occupational Therapy Session Note  Patient Details  Name: [name] MRN: [id number] Date of Birth: 1966/07/11  Today's Date: 05/20/2024 OT Individual Time: 8984-8864 OT Individual Time Calculation (min): 80 min    Short Term Goals: Week 1:  OT Short Term Goal 1 (Week 1): Pt will maintain static sit EOB with CGA to supervision to engage in UB self care safely. OT Short Term Goal 1 - Progress (Week 1): Met OT Short Term Goal 2 (Week 1): Pt will complete squat pivot transfers from bed >< w/c with mod A or less with 1 person to work towards a safe toilet transfers. OT Short Term Goal 2 - Progress (Week 1): Progressing toward goal OT Short Term Goal 3 (Week 1): Pt will demonstrate improved L arm awareness to attend to arm during bed mobility tasks with min cues. OT Short Term Goal 3 - Progress (Week 1): Met OT Short Term Goal 4 (Week 1): Pt will be able to don pull over shirt with mod A. OT Short Term Goal 4 - Progress (Week 1): Met Week 2:  OT Short Term Goal 1 (Week 2): Pt will complete squat pivot toilet transfers with mod A or less. OT Short Term Goal 2 (Week 2): Pt will be able to hold static stand with min A to enable caregiver to adjust clothing over hips. OT Short Term Goal 3 (Week 2): pt will be able to reach feet with long sponge to wash feet. OT Short Term Goal 4 (Week 2): pt will complete self ROM for LUE with min A.  Skilled Therapeutic Interventions/Progress Updates:    Pt received in TIS w/c ready for therapy with dtr present and she attended session.  Pt taken to gym to focus on transfers, sit to partial squats, weight shifts forward,  leaning forward, and attention to L side.   Rehab tech present to provide assist as needed.  Pt initially fearful of leaning forward so adjusted activity to having her lean forward and place hand on bench in between her legs for a solid support.  Pt did better with this and able to wt shift and lift hips. Then integrated hip scoot side to side once  hips elevated to prepare for more controlled transfers.  Biggest challenge today was pt's attention span (only a few seconds at a time) and attending to her left side and left environment.  Pt able to do squat pivot transfers in B directions 4-5 x with min to mod to lift hips and then max to actually pivot hips.  Multiple repetitions with static holds of coming into a squat position with heavy mod A.  LUE weight bearing with forward reaching and use of L hand on ball with rolling ball forward and back with total A.  Pt needed to toilet. Pt returned to room. Used stedy lift to transfer pt w/c to toielt, to sink to wash hands, to EOB.   Pt able to wash and cleanse front area, therapist assisted with back and with clothing.   Pt then assisted to supine and helped pt to change her pants.  In bed with all needs met. Alarm set and her daughter present.   Therapy Documentation Precautions:  Precautions Precautions: Fall Recall of Precautions/Restrictions: Impaired Restrictions Weight Bearing Restrictions Per Provider Order: No   Pain: Pain Assessment Pain Score: 0-No pain ADL: ADL Eating: Supervision/safety Grooming: Setup Upper Body Bathing: Minimal assistance Lower Body Bathing: Moderate assistance Where Assessed-Lower Body Bathing: Shower Upper Body Dressing: Moderate assistance  Where Assessed-Upper Body Dressing: Wheelchair Lower Body Dressing: Dependent Where Assessed-Lower Body Dressing: Wheelchair Toileting: Dependent Where Assessed-Toileting: Teacher, adult education: Other (comment) (used stedy lift with +2)  Therapy/Group: Individual Therapy  Makaylyn Sinyard 05/20/2024, 1:02 PM

## 2024-05-20 NOTE — Plan of Care (Signed)
  Problem: Consults Goal: RH STROKE PATIENT EDUCATION Description: See Patient Education module for education specifics  Outcome: Progressing Goal: Nutrition Consult-if indicated Outcome: Progressing Goal: Diabetes Guidelines if Diabetic/Glucose > 140 Description: If diabetic or lab glucose is > 140 mg/dl - Initiate Diabetes/Hyperglycemia Guidelines & Document Interventions  Outcome: Progressing   Problem: RH BOWEL ELIMINATION Goal: RH STG MANAGE BOWEL WITH ASSISTANCE Description: STG Manage Bowel with mod I Assistance. Outcome: Progressing Goal: RH STG MANAGE BOWEL W/MEDICATION W/ASSISTANCE Description: STG Manage Bowel with Medication with mod I Assistance. Outcome: Progressing   Problem: RH SAFETY Goal: RH STG ADHERE TO SAFETY PRECAUTIONS W/ASSISTANCE/DEVICE Description: STG Adhere to Safety Precautions With cues Assistance/Device. Outcome: Progressing   Problem: RH KNOWLEDGE DEFICIT Goal: RH STG INCREASE KNOWLEDGE OF DIABETES Description: Patient and family will be able to manage DM using educational resources for medications and dietary modification independently Outcome: Progressing Goal: RH STG INCREASE KNOWLEDGE OF HYPERTENSION Description: Patient and family will be able to manage HTN using educational resources for medications and dietary modification independently Outcome: Progressing Goal: RH STG INCREASE KNOWLEDGE OF DYSPHAGIA/FLUID INTAKE Description: Patient and family will be able to manage Dysphagia using educational resources for medications and dietary modification independently Outcome: Progressing Goal: RH STG INCREASE KNOWLEGDE OF HYPERLIPIDEMIA Description: Patient and family will be able to manage HLD using educational resources for medications and dietary modification independently Outcome: Progressing Goal: RH STG INCREASE KNOWLEDGE OF STROKE PROPHYLAXIS Description: Patient and family will be able to manage secondary risks using educational resources  for medications and dietary modification independently Outcome: Progressing   Problem: Education: Goal: Ability to describe self-care measures that may prevent or decrease complications (Diabetes Survival Skills Education) will improve Outcome: Progressing Goal: Individualized Educational Video(s) Outcome: Progressing   Problem: Coping: Goal: Ability to adjust to condition or change in health will improve Outcome: Progressing   Problem: Fluid Volume: Goal: Ability to maintain a balanced intake and output will improve Outcome: Progressing   Problem: Health Behavior/Discharge Planning: Goal: Ability to identify and utilize available resources and services will improve Outcome: Progressing Goal: Ability to manage health-related needs will improve Outcome: Progressing   Problem: Metabolic: Goal: Ability to maintain appropriate glucose levels will improve Outcome: Progressing   Problem: Nutritional: Goal: Maintenance of adequate nutrition will improve Outcome: Progressing Goal: Progress toward achieving an optimal weight will improve Outcome: Progressing   Problem: Skin Integrity: Goal: Risk for impaired skin integrity will decrease Outcome: Progressing   Problem: Tissue Perfusion: Goal: Adequacy of tissue perfusion will improve Outcome: Progressing

## 2024-05-20 NOTE — Progress Notes (Signed)
 Patient ID: [name], female   DOB: July 05, 1966, 58 y.o.   MRN: [id number] Left message with Jon Judge CM to being process to set up aide services at discharge. Will await her return call

## 2024-05-21 DIAGNOSIS — K5901 Slow transit constipation: Secondary | ICD-10-CM | POA: Diagnosis not present

## 2024-05-21 DIAGNOSIS — I63521 Cerebral infarction due to unspecified occlusion or stenosis of right anterior cerebral artery: Secondary | ICD-10-CM | POA: Diagnosis not present

## 2024-05-21 DIAGNOSIS — R739 Hyperglycemia, unspecified: Secondary | ICD-10-CM

## 2024-05-21 DIAGNOSIS — I10 Essential (primary) hypertension: Secondary | ICD-10-CM | POA: Diagnosis not present

## 2024-05-21 LAB — GLUCOSE, CAPILLARY: Glucose-Capillary: 106 mg/dL — ABNORMAL HIGH (ref 70–99)

## 2024-05-21 NOTE — Plan of Care (Signed)
  Problem: Consults Goal: RH STROKE PATIENT EDUCATION Description: See Patient Education module for education specifics  Outcome: Progressing   Problem: RH BOWEL ELIMINATION Goal: RH STG MANAGE BOWEL WITH ASSISTANCE Description: STG Manage Bowel with mod I Assistance. Outcome: Progressing Goal: RH STG MANAGE BOWEL W/MEDICATION W/ASSISTANCE Description: STG Manage Bowel with Medication with mod I Assistance. Outcome: Progressing   Problem: Coping: Goal: Ability to adjust to condition or change in health will improve Outcome: Progressing   Problem: Metabolic: Goal: Ability to maintain appropriate glucose levels will improve Outcome: Progressing   Problem: Skin Integrity: Goal: Risk for impaired skin integrity will decrease Outcome: Progressing   Problem: Tissue Perfusion: Goal: Adequacy of tissue perfusion will improve Outcome: Progressing

## 2024-05-21 NOTE — Progress Notes (Signed)
 Speech Language Pathology Daily Session Note  Patient Details  Name: [name] MRN: [id number] Date of Birth: Oct 31, 1965  Today's Date: 05/21/2024 SLP Individual Time: 9080-9042 SLP Individual Time Calculation (min): 38 min  Short Term Goals: Week 2: SLP Short Term Goal 1 (Week 2): Patient will consume least restrictive diet with use of swallowing compensatory strategies given supervision multimodal A SLP Short Term Goal 2 (Week 2): Patient will demonstrate orientation to self, location, situation and time given min multimodal A SLP Short Term Goal 3 (Week 2): Patient will demonstrate attention to the L during functional tasks given mod multimodal A SLP Short Term Goal 4 (Week 2): Patient will recall activities completed in prior therapy sessions given mod multimodal A  Skilled Therapeutic Interventions: SLP conducted skilled therapy session targeting cognitive and swallowing goals. SLP assessed tolerance of Dys3 solids, patient continues to demonstrate prolonged mastication, reduced problem solving during bolus intake/self feeding, and mild oral residuals. Patient benefited from reorientation to swallow deficits (I.e. L pocketing) though liquid wash strategies was helpful and independently utilized. Cognitively, targeted L inattention via scanning/sustained attention task. Patient benefited from max cues to orient to the left side of task consistently and to sustain attention to the left side. Patient was left in room with call bell in reach and alarm set. SLP will continue to target goals per plan of care.        Pain  None  Therapy/Group: Individual Therapy  [name], M.A., CCC-SLP  [name] A [name] 05/21/2024, 9:57 AM

## 2024-05-21 NOTE — Progress Notes (Signed)
 PROGRESS NOTE   Subjective/Complaints:  Pt doing alright, slept well, denies pain currently, LBM yesterday per pt but not documented since 9/4, urinating fine per pt but incontinent per documentation. No other complaints or concerns.   ROS: Limited due to cognitive/behavioral but denies CP, SOB, abd pain, n/v/d/c    Objective:   No results found. Recent Labs    05/19/24 0519  WBC 10.9*  HGB 13.7  HCT 42.6  PLT 253    No results for input(s): NA, K, CL, CO2, GLUCOSE, BUN, CREATININE, CALCIUM  in the last 72 hours.    Intake/Output Summary (Last 24 hours) at 05/21/2024 1313 Last data filed at 05/21/2024 1245 Gross per 24 hour  Intake 472 ml  Output --  Net 472 ml        Physical Exam: Vital Signs Blood pressure 107/71, pulse 70, temperature 98.9 F (37.2 C), temperature source Oral, resp. rate 16, height 5' 3.5 (1.613 m), weight 88.6 kg, SpO2 100%.   General: No acute distress, sitting up in w/c Mood and affect are appropriate Heart: Regular rate and rhythm no rubs murmurs or extra sounds Lungs: Clear to auscultation, breathing unlabored, no rales or wheezes Abdomen: Positive bowel sounds, soft nontender to palpation, nondistended Extremities: No clubbing, cyanosis, or edema Skin: No evidence of breakdown, no evidence of rash over exposed surfaces  PRIOR EXAMS: Neurologic: Cranial nerves II through XII intact, motor strength is 5/5 in R deltoid, bicep, tricep, grip, hip flexor, knee extensors, ankle dorsiflexor and plantar flexor   Neuro:  much more alert today. . Right gaze preference ongoing but can be cued to left verbally. Follows basic commands. Voice stronger, speech sl dysarthric, left central VII. MMT:LUE 0/5. perhaps trace Left elbow flexion and Hip Ext LLE when extended  but otherwise 0/5 . Moves right side freely. Senses pain and light touch in all 4's. LUE DTR's 1+. LLE DTR's brisk 3+  with early extensor tone, toes up. Increasing left pectoralis and elbow flexor tone MAS 2/3   + triple flexor Musculoskeletal: no pain with AROM/PROM  No swelling LLE, no hamstrings or adductor pain    Assessment/Plan: 1. Functional deficits which require 3+ hours per day of interdisciplinary therapy in a comprehensive inpatient rehab setting. Physiatrist is providing close team supervision and 24 hour management of active medical problems listed below. Physiatrist and rehab team continue to assess barriers to discharge/monitor patient progress toward functional and medical goals  Care Tool:  Bathing    Body parts bathed by patient: Abdomen, Front perineal area, Left arm, Face, Chest, Right upper leg, Left upper leg   Body parts bathed by helper: Right lower leg, Left lower leg, Right arm     Bathing assist Assist Level: Moderate Assistance - Patient 50 - 74%     Upper Body Dressing/Undressing Upper body dressing   What is the patient wearing?: Pull over shirt    Upper body assist Assist Level: Moderate Assistance - Patient 50 - 74%    Lower Body Dressing/Undressing Lower body dressing      What is the patient wearing?: Incontinence brief, Pants     Lower body assist Assist for lower body dressing: Total Assistance -  Patient < 25%     Editor, commissioning assist Assist for toileting: 2 Helpers     Transfers Chair/bed transfer  Transfers assist  Chair/bed transfer activity did not occur: Safety/medical concerns  Chair/bed transfer assist level: Dependent - mechanical lift (stedy)     Locomotion Ambulation   Ambulation assist   Ambulation activity did not occur: Safety/medical concerns  Assist level: 2 helpers Assistive device: Other (comment) (R hand rail\) Max distance: 86ft   Walk 10 feet activity   Assist  Walk 10 feet activity did not occur: Safety/medical concerns  Assist level: 2 helpers Assistive device: Other (comment) (R  hand rail)   Walk 50 feet activity   Assist Walk 50 feet with 2 turns activity did not occur: Safety/medical concerns         Walk 150 feet activity   Assist Walk 150 feet activity did not occur: Safety/medical concerns         Walk 10 feet on uneven surface  activity   Assist Walk 10 feet on uneven surfaces activity did not occur: Safety/medical concerns         Wheelchair     Assist Is the patient using a wheelchair?: Yes Type of Wheelchair: Manual    Wheelchair assist level: Dependent - Patient 0% Max wheelchair distance: 150    Wheelchair 50 feet with 2 turns activity    Assist        Assist Level: Dependent - Patient 0%   Wheelchair 150 feet activity     Assist      Assist Level: Dependent - Patient 0%   Blood pressure 107/71, pulse 70, temperature 98.9 F (37.2 C), temperature source Oral, resp. rate 16, height 5' 3.5 (1.613 m), weight 88.6 kg, SpO2 100%.  Medical Problem List and Plan: 1. 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 Pt is s/p IR w TICI2b. 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  Antithrombotics: -DVT/anticoagulation:  Pharmaceutical: Xarelto  20mg  daily             -antiplatelet therapy: N/A 3. Pain Management: Tylenol  prn for pain              --Has been on topamax  for HA in the past.  -pain seems controlled for the most part. Would like to stay with tylenol  if possible given arousal 4. Mood/Behavior/Sleep: LCSW to follow for evaluaton and support.              --trazodone  prn for insomnia.              -antipsychotic agents: N/A 5. Neuropsych/cognition: This patient is capable of making decisions on her own behalf.  Cont amantadine  100mg  BID 9/4     6. Skin/Wound  Care: Routine pressure relief measures.  7. Fluids/Electrolytes/Nutrition: Monitor I/O. Add CM restrictions to diet             -Post stroke Dysphagia --SLP following. D2 diet/thins            -encourage PO, protein supp for low albumin  8. A fib: Monitor  HR TID-- Metoprolol  succinate 200 mg daily  --Continue Xarelto .  9. HTN: Monitor BP TID. On metoprolol  and aldactone  25mg  daily  -BP stable, monitor Vitals:   05/18/24 0508 05/18/24 0757 05/18/24 1300 05/18/24 1935  BP: 119/74 118/86 129/72 113/79   05/19/24 0548 05/19/24 0800 05/19/24 1411 05/19/24 1951  BP: 122/68 (!) 116/92 109/70 122/76   05/20/24 0428 05/20/24 1519 05/20/24 1949 05/21/24 0455  BP: 114/77 115/81 103/68 107/71     10. NICM w/HFrEF: Discussed with Cardiology Dr. Maceo. Digoxin  and metoprolol  tartrate was stooped. Entresto  not continued. She is continued on spironolactone  and Metoprolol  succinate 200mg  daily in the AM. Trying to keep regimen simple. Appreciate the follow up 11.  Pre-diabetes: had been On Farxiga . Added CM restrictions to diet.  --Monitor BS ac/hs and use SSI tor elevated BS.  Controlled 9/5 only daily checks -05/21/24 CBGs fine, could consider d/c checks if continued stability  CBG (last 3)  Recent Labs    05/19/24 0606 05/20/24 0601 05/21/24 0528  GLUCAP 105* 90 106*    12. HLD- LDL 109, crestor  40 mg daily  13. Obesity:  BMI 34.84 educate on diet and weight loss to promote overall health and mobility.  14. Leukocytosis: no s/s infection, gradually improving    Latest Ref Rng & Units 05/19/2024    5:19 AM 05/16/2024    5:38 AM 05/12/2024    5:00 AM  CBC  WBC 4.0 - 10.5 K/uL 10.9  13.6  12.3   Hemoglobin 12.0 - 15.0 g/dL 86.2  85.7  85.5   Hematocrit 36.0 - 46.0 % 42.6  44.7  44.5   Platelets 150 - 400 K/uL 253  283  231      15. Slow transit constipation:   -miralax  scheduled daily with prns -05/21/24 LBM yesterday per pt, but not documented since 9/4, monitor for now  LOS: 12 days A  FACE TO FACE EVALUATION WAS PERFORMED  [address] 05/21/2024, 1:13 PM

## 2024-05-22 DIAGNOSIS — I63521 Cerebral infarction due to unspecified occlusion or stenosis of right anterior cerebral artery: Secondary | ICD-10-CM | POA: Diagnosis not present

## 2024-05-22 DIAGNOSIS — I10 Essential (primary) hypertension: Secondary | ICD-10-CM | POA: Diagnosis not present

## 2024-05-22 DIAGNOSIS — R739 Hyperglycemia, unspecified: Secondary | ICD-10-CM | POA: Diagnosis not present

## 2024-05-22 DIAGNOSIS — K5901 Slow transit constipation: Secondary | ICD-10-CM | POA: Diagnosis not present

## 2024-05-22 LAB — URINALYSIS, W/ REFLEX TO CULTURE (INFECTION SUSPECTED)
Bacteria, UA: NONE SEEN
Bilirubin Urine: NEGATIVE
Glucose, UA: NEGATIVE mg/dL
Ketones, ur: NEGATIVE mg/dL
Nitrite: NEGATIVE
Protein, ur: >=300 mg/dL — AB
Specific Gravity, Urine: 1.018 (ref 1.005–1.030)
WBC, UA: >50 WBC/hpf (ref 0–5)
pH: 7 (ref 5.0–8.0)

## 2024-05-22 LAB — GLUCOSE, CAPILLARY: Glucose-Capillary: 108 mg/dL — ABNORMAL HIGH (ref 70–99)

## 2024-05-22 MED ORDER — CEPHALEXIN 250 MG PO CAPS
500.0000 mg | ORAL_CAPSULE | Freq: Three times a day (TID) | ORAL | Status: AC
  Administered 2024-05-22 – 2024-05-29 (×21): 500 mg via ORAL
  Filled 2024-05-22 (×20): qty 2

## 2024-05-22 NOTE — Progress Notes (Signed)
 PROGRESS NOTE   Subjective/Complaints:  Pt doing ok, slept well, pain well managed, LBM 2d ago per pt but not documented since 9/4, urinating fine per pt but incontinent per documentation-- and nursing reporting that she is having frequency/urge to urinate after emptying as well as cloudy malodorous urine, so U/A was ordered yesterday-- not yet collected. No other complaints or concerns.   ROS: Limited due to cognitive/behavioral but denies CP, SOB, abd pain, n/v/d/c    Objective:   No results found. No results for input(s): WBC, HGB, HCT, PLT in the last 72 hours.   No results for input(s): NA, K, CL, CO2, GLUCOSE, BUN, CREATININE, CALCIUM  in the last 72 hours.    Intake/Output Summary (Last 24 hours) at 05/22/2024 1129 Last data filed at 05/22/2024 0809 Gross per 24 hour  Intake 295 ml  Output --  Net 295 ml        Physical Exam: Vital Signs Blood pressure 128/73, pulse 85, temperature 98.5 F (36.9 C), temperature source Oral, resp. rate 16, height 5' 3.5 (1.613 m), weight 88.6 kg, SpO2 100%.   General: No acute distress, resting comfortably in bed. Mood and affect are appropriate Heart: Regular rate and rhythm no rubs murmurs or extra sounds Lungs: Clear to auscultation, breathing unlabored, no rales or wheezes Abdomen: Positive bowel sounds, soft nontender to palpation, nondistended Extremities: No clubbing, cyanosis, or edema Skin: No evidence of breakdown, no evidence of rash over exposed surfaces  PRIOR EXAMS: Neurologic: Cranial nerves II through XII intact, motor strength is 5/5 in R deltoid, bicep, tricep, grip, hip flexor, knee extensors, ankle dorsiflexor and plantar flexor   Neuro:  much more alert today. . Right gaze preference ongoing but can be cued to left verbally. Follows basic commands. Voice stronger, speech sl dysarthric, left central VII. MMT:LUE 0/5. perhaps trace  Left elbow flexion and Hip Ext LLE when extended  but otherwise 0/5 . Moves right side freely. Senses pain and light touch in all 4's. LUE DTR's 1+. LLE DTR's brisk 3+ with early extensor tone, toes up. Increasing left pectoralis and elbow flexor tone MAS 2/3   + triple flexor Musculoskeletal: no pain with AROM/PROM  No swelling LLE, no hamstrings or adductor pain    Assessment/Plan: 1. Functional deficits which require 3+ hours per day of interdisciplinary therapy in a comprehensive inpatient rehab setting. Physiatrist is providing close team supervision and 24 hour management of active medical problems listed below. Physiatrist and rehab team continue to assess barriers to discharge/monitor patient progress toward functional and medical goals  Care Tool:  Bathing    Body parts bathed by patient: Abdomen, Front perineal area, Left arm, Face, Chest, Right upper leg, Left upper leg   Body parts bathed by helper: Right lower leg, Left lower leg, Right arm     Bathing assist Assist Level: Moderate Assistance - Patient 50 - 74%     Upper Body Dressing/Undressing Upper body dressing   What is the patient wearing?: Pull over shirt    Upper body assist Assist Level: Moderate Assistance - Patient 50 - 74%    Lower Body Dressing/Undressing Lower body dressing      What is  the patient wearing?: Incontinence brief, Pants     Lower body assist Assist for lower body dressing: Total Assistance - Patient < 25%     Toileting Toileting    Toileting assist Assist for toileting: 2 Helpers     Transfers Chair/bed transfer  Transfers assist  Chair/bed transfer activity did not occur: Safety/medical concerns  Chair/bed transfer assist level: Dependent - mechanical lift (stedy)     Locomotion Ambulation   Ambulation assist   Ambulation activity did not occur: Safety/medical concerns  Assist level: 2 helpers Assistive device: Other (comment) (R hand rail\) Max distance: 51ft    Walk 10 feet activity   Assist  Walk 10 feet activity did not occur: Safety/medical concerns  Assist level: 2 helpers Assistive device: Other (comment) (R hand rail)   Walk 50 feet activity   Assist Walk 50 feet with 2 turns activity did not occur: Safety/medical concerns         Walk 150 feet activity   Assist Walk 150 feet activity did not occur: Safety/medical concerns         Walk 10 feet on uneven surface  activity   Assist Walk 10 feet on uneven surfaces activity did not occur: Safety/medical concerns         Wheelchair     Assist Is the patient using a wheelchair?: Yes Type of Wheelchair: Manual    Wheelchair assist level: Dependent - Patient 0% Max wheelchair distance: 150    Wheelchair 50 feet with 2 turns activity    Assist        Assist Level: Dependent - Patient 0%   Wheelchair 150 feet activity     Assist      Assist Level: Dependent - Patient 0%   Blood pressure 128/73, pulse 85, temperature 98.5 F (36.9 C), temperature source Oral, resp. rate 16, height 5' 3.5 (1.613 m), weight 88.6 kg, SpO2 100%.  Medical Problem List and Plan: 1. 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 Pt is s/p IR w TICI2b. 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  Antithrombotics: -DVT/anticoagulation:  Pharmaceutical: Xarelto  20mg  daily             -antiplatelet therapy: N/A 3. Pain Management: Tylenol  prn for pain              --Has been on topamax  for HA in the past.  -pain seems controlled for the most part. Would like to stay with tylenol  if possible given arousal 4. Mood/Behavior/Sleep: LCSW to follow for evaluaton and support.              --trazodone  prn for insomnia.               -antipsychotic agents: N/A 5. Neuropsych/cognition: This patient is capable of making decisions on her own behalf.  Cont amantadine  100mg  BID 9/4     6. Skin/Wound Care: Routine pressure relief measures.  7. Fluids/Electrolytes/Nutrition: Monitor I/O. Add CM restrictions to diet             -Post stroke Dysphagia --SLP following. D2 diet/thins            -  encourage PO, protein supp for low albumin  8. A fib: Monitor HR TID-- Metoprolol  succinate 200 mg daily  --Continue Xarelto .  9. HTN: Monitor BP TID. On metoprolol  and aldactone  25mg  daily  -BP stable, monitor Vitals:   05/18/24 1935 05/19/24 0548 05/19/24 0800 05/19/24 1411  BP: 113/79 122/68 (!) 116/92 109/70   05/19/24 1951 05/20/24 0428 05/20/24 1519 05/20/24 1949  BP: 122/76 114/77 115/81 103/68   05/21/24 0455 05/21/24 1429 05/21/24 2052 05/22/24 0544  BP: 107/71 97/73 110/70 128/73     10. NICM w/HFrEF: Discussed with Cardiology Dr. Maceo. Digoxin  and metoprolol  tartrate was stooped. Entresto  not continued. She is continued on spironolactone  and Metoprolol  succinate 200mg  daily in the AM. Trying to keep regimen simple. Appreciate the follow up 11.  Pre-diabetes: had been On Farxiga . Added CM restrictions to diet.  --Monitor BS ac/hs and use SSI tor elevated BS.  Controlled 9/5 only daily checks -9/6-7/25 CBGs fine, could consider d/c checks if continued stability  CBG (last 3)  Recent Labs    05/20/24 0601 05/21/24 0528 05/22/24 0547  GLUCAP 90 106* 108*    12. HLD- LDL 109, crestor  40 mg daily  13. Obesity:  BMI 34.84 educate on diet and weight loss to promote overall health and mobility.  14. Leukocytosis: no s/s infection, gradually improving    Latest Ref Rng & Units 05/19/2024    5:19 AM 05/16/2024    5:38 AM 05/12/2024    5:00 AM  CBC  WBC 4.0 - 10.5 K/uL 10.9  13.6  12.3   Hemoglobin 12.0 - 15.0 g/dL 86.2  85.7  85.5   Hematocrit 36.0 - 46.0 % 42.6  44.7  44.5   Platelets 150 - 400 K/uL 253  283  231       15. Slow transit constipation:   -miralax  scheduled daily with prns -05/22/24 LBM 2d ago per pt, but not documented since 9/4, monitor for now, if none by tomorrow would advance meds.   16. Cloudy malodorous urine per nursing 9/6, nursing also reported frequency/urge to urinate after emptying-- ordered U/A but not yet done 9/7, monitor for results   LOS: 13 days A FACE TO [facility] EVALUATION WAS PERFORMED  [address] 05/22/2024, 11:29 AM

## 2024-05-23 DIAGNOSIS — I63521 Cerebral infarction due to unspecified occlusion or stenosis of right anterior cerebral artery: Secondary | ICD-10-CM | POA: Diagnosis not present

## 2024-05-23 LAB — CBC
HCT: 43.7 % (ref 36.0–46.0)
Hemoglobin: 13.9 g/dL (ref 12.0–15.0)
MCH: 26.3 pg (ref 26.0–34.0)
MCHC: 31.8 g/dL (ref 30.0–36.0)
MCV: 82.8 fL (ref 80.0–100.0)
Platelets: 200 K/uL (ref 150–400)
RBC: 5.28 MIL/uL — ABNORMAL HIGH (ref 3.87–5.11)
RDW: 13.6 % (ref 11.5–15.5)
WBC: 9.7 K/uL (ref 4.0–10.5)
nRBC: 0 % (ref 0.0–0.2)

## 2024-05-23 LAB — BASIC METABOLIC PANEL WITH GFR
Anion gap: 12 (ref 5–15)
BUN: 16 mg/dL (ref 6–20)
CO2: 19 mmol/L — ABNORMAL LOW (ref 22–32)
Calcium: 9.7 mg/dL (ref 8.9–10.3)
Chloride: 105 mmol/L (ref 98–111)
Creatinine, Ser: 1.38 mg/dL — ABNORMAL HIGH (ref 0.44–1.00)
GFR, Estimated: 44 mL/min — ABNORMAL LOW (ref >60)
Glucose, Bld: 143 mg/dL — ABNORMAL HIGH (ref 70–99)
Potassium: 4 mmol/L (ref 3.5–5.1)
Sodium: 136 mmol/L (ref 135–145)

## 2024-05-23 LAB — GLUCOSE, CAPILLARY: Glucose-Capillary: 101 mg/dL — ABNORMAL HIGH (ref 70–99)

## 2024-05-23 NOTE — Progress Notes (Signed)
 Physical Therapy Session Note  Patient Details  Name: [name] MRN: [id number] Date of Birth: 1966/04/01  Today's Date: 05/23/2024 PT Individual Time: 1002-1100 PT Individual Time Calculation (min): 58 min   Short Term Goals: Week 2:  PT Short Term Goal 1 (Week 2): Pt will perform mod A squat pivot transfers bed <> chair PT Short Term Goal 2 (Week 2): During transfers, pt will maintain head/hip relationship with min verbal cues PT Short Term Goal 3 (Week 2): Pt will progress gait x 35' with mod A to advance LLE PT Short Term Goal 4 (Week 2): Pt will demonstrate dynamic standing balance without RUE support with mod A  Skilled Therapeutic Interventions/Progress Updates: Pt presents sitting in TIS and agreeable to therapy.  PT donned shoes and L AFO and wheeled to main gym hallway.  Pt performed multiple sit to stand transfers w/ mod A and verbal and tactile cues for sequencing.  Pt encouraged to use R hand on arm rest of w/c for transfers.  Pt amb x 30' x 4 w/ mod/max A for LLE advancement.  Pt improves weight shift to R and then initiates advancement of LLE, although still requires placement assist.  PT available for blocking of L knee for weight acceptance, but min need.  Pt performed sit to stand and then scooting forward/backward in w/c w/ PT in front, blocking L knee.  Pt returned to room and remained seated in TIS, reclined back w/ chair alarm on and all needs in reach, son present.     Therapy Documentation Precautions:  Precautions Precautions: Fall Recall of Precautions/Restrictions: Impaired Restrictions Weight Bearing Restrictions Per Provider Order: No General:   Vital Signs:   Pain:0/10     Therapy/Group: Individual Therapy  [name] P Berdella Bacot 05/23/2024, 11:07 AM

## 2024-05-23 NOTE — Progress Notes (Signed)
 Occupational Therapy Session Note  Patient Details  Name: [name] MRN: [id number] Date of Birth: 04/03/1966  Today's Date: 05/23/2024 OT Individual Time: 9173-9060 OT Individual Time Calculation (min): 73 min    Short Term Goals: Week 2:  OT Short Term Goal 1 (Week 2): Pt will complete squat pivot toilet transfers with mod A or less. OT Short Term Goal 2 (Week 2): Pt will be able to hold static stand with min A to enable caregiver to adjust clothing over hips. OT Short Term Goal 3 (Week 2): pt will be able to reach feet with long sponge to wash feet. OT Short Term Goal 4 (Week 2): pt will complete self ROM for LUE with min A.  Skilled Therapeutic Interventions/Progress Updates:  Pt greeted supine in bed, pt agreeable to OT intervention.      Transfers/bed mobility/functional mobility: pt completed supine>sit with most assist to mange trunk into sitting, pt using hooking method to maneuver LLE in bed. Pt completed stand pivot transfer to TIS to R side with MIN A +2. Assist needed to support LUE and to maneuver LLE foot placement.   Pt completed sit>stands from EOB with MODA+1 with L hemibody supported.   ADLs:  Grooming: pt completed seated oral care at sink with set- up assist, pt also washed her hands at sink with MIN A needed to incorporate L hand into task.  UB dressing:donned [place] shirt with MODA from EOB, assist needed to thread LUE but pt able to complete remainder of task with + time.  LB dressing: donned pants from EOB with MAX A. Pt needed assist to thread pants and pull to waist in standing. Pt did assist with pulling pants up on R side.   Transfers: pt completed stand pivot transfer to toilet today! MODA overall with MOD cues needed for set- up and technique. Assist needed to mange LLE during pivot.   Toileting: continent urine void, pt needed MODA for 3/3 toielting tasks with pt able to cleanse anterior periarea in standing.    NMR:  Positioned in supine on mat with a  focus on AROM in LUE. When pts LUE was abducted, pt was able to adduct LUE back to midline with pt sliding LUE in/out. Additionally noted trace activation in L bicep today when assessing gravity eliminated AROM.   Left saebo estim on pts L bicep at the below settings for 30 mins: It was increased to level 3.  no c/o pain before/after tx and no adverse skin reactions.  330 pulse width 35 Hz pulse rate On 8 sec/ off 8 sec Ramp up/ down 2 sec Symmetrical Biphasic wave form  Max intensity at 500 Ohm load    Ended session with pt relcined in TIS with all needs within reach.  Therapy Documentation Precautions:  Precautions Precautions: Fall Recall of Precautions/Restrictions: Impaired Restrictions Weight Bearing Restrictions Per Provider Order: No  Pain: No pain    Therapy/Group: Individual Therapy  Ronal Gift [facility] 05/23/2024, 12:15 PM

## 2024-05-23 NOTE — Progress Notes (Signed)
 Patient ID: [name], female   DOB: 1966-07-10, 58 y.o.   MRN: [id number] Gave FMLA completed forms to daughter-Latoya. She will be assisting with pt's care.

## 2024-05-23 NOTE — Progress Notes (Signed)
 Speech Language Pathology Daily Session Note  Patient Details  Name: [name] MRN: [id number] Date of Birth: Aug 01, 1966  Today's Date: 05/23/2024 SLP Individual Time: 0101-0200 SLP Individual Time Calculation (min): 59 min  Short Term Goals: Week 2: SLP Short Term Goal 1 (Week 2): Patient will consume least restrictive diet with use of swallowing compensatory strategies given supervision multimodal A SLP Short Term Goal 2 (Week 2): Patient will demonstrate orientation to self, location, situation and time given min multimodal A SLP Short Term Goal 3 (Week 2): Patient will demonstrate attention to the L during functional tasks given mod multimodal A SLP Short Term Goal 4 (Week 2): Patient will recall activities completed in prior therapy sessions given mod multimodal A  Skilled Therapeutic Interventions:  Patient was seen in PM to address cognitive re- training and dysphagia management. Pt was alert and seated upright in WC upon SLP arrival. Many family members present and participating intermittently throughout session. Pt oriented to time (minus day of week), place, and situation. She recalled completion of PT and OT sessions prior to SLP arrival giving detailed information about participation in each session with min A. Large bulk of session spent on L inattention with tasks ranging from structured to more functional. She completed structured tasks I.e. identifying 2 opposing objects in a picture given mod I. Intensity increased through challenging pt to identify a matching card given a FO10 objects which she completed with min A. More structured tasks included interpreting a therapy schedule and then a TV guide. Utilizing lighthouse and visual anchor techniques pt warranting max A to identify information. SLP addressing swallowing through presenting advanced diet trials. Pt indep with administering small boluses. She demo appeared adequate oral phase with supervision use of lingual sweeps to  clear oral cavity. She was without s/s aspiration. At conclusion of session, pt was left upright in [facility] with call button within reach and bed alarm active. SLP to continue POC.  Pain Pain Assessment Pain Scale: 0-10 Pain Score: 0-No pain  Therapy/Group: Individual Therapy  [name] 05/23/2024, 3:44 PM

## 2024-05-23 NOTE — Progress Notes (Signed)
 PROGRESS NOTE   Subjective/Complaints:    ROS: Limited due to cognitive/behavioral but denies CP, SOB, abd pain, n/v/d/c    Objective:   No results found. Recent Labs    05/23/24 0510  WBC 9.7  HGB 13.9  HCT 43.7  PLT 200     No results for input(s): NA, K, CL, CO2, GLUCOSE, BUN, CREATININE, CALCIUM  in the last 72 hours.    Intake/Output Summary (Last 24 hours) at 05/23/2024 0956 Last data filed at 05/23/2024 0831 Gross per 24 hour  Intake 354 ml  Output --  Net 354 ml        Physical Exam: Vital Signs Blood pressure 100/84, pulse 70, temperature 97.6 F (36.4 C), temperature source Oral, resp. rate 16, height 5' 3.5 (1.613 m), weight 88.6 kg, SpO2 92%.   General: No acute distress, resting comfortably in bed. Mood and affect are appropriate Heart: Regular rate and rhythm no rubs murmurs or extra sounds Lungs: Clear to auscultation, breathing unlabored, no rales or wheezes Abdomen: Positive bowel sounds, soft nontender to palpation, nondistended Extremities: No clubbing, cyanosis, or edema Skin: No evidence of breakdown, no evidence of rash over exposed surfaces Neurologic: Cranial nerves II through XII intact, motor strength is 5/5 in R deltoid, bicep, tricep, grip, hip flexor, knee extensors, ankle dorsiflexor and plantar flexor  Neuro:  much more alert today. . Right gaze preference ongoing but can be cued to left verbally. Follows basic commands.  speech sl dysarthric, left central VII. MMT:LUE 0/5. trace Left elbow flexion and Hip Ext LLE 2- with knee ext synergy pattern  . Moves right side freely. Senses pain and light touch in all 4's. LUE DTR's 1+. LLE DTR's brisk 3+ with early extensor tone, toes up. Increasing left pectoralis and elbow flexor tone MAS 2/3   + triple flexor Musculoskeletal: no pain with AROM/PROM  No swelling LLE, no hamstrings or adductor pain     Assessment/Plan: 1. Functional deficits which require 3+ hours per day of interdisciplinary therapy in a comprehensive inpatient rehab setting. Physiatrist is providing close team supervision and 24 hour management of active medical problems listed below. Physiatrist and rehab team continue to assess barriers to discharge/monitor patient progress toward functional and medical goals  Care Tool:  Bathing    Body parts bathed by patient: Abdomen, Front perineal area, Left arm, Face, Chest, Right upper leg, Left upper leg   Body parts bathed by helper: Right lower leg, Left lower leg, Right arm     Bathing assist Assist Level: Moderate Assistance - Patient 50 - 74%     Upper Body Dressing/Undressing Upper body dressing   What is the patient wearing?: Pull over shirt    Upper body assist Assist Level: Moderate Assistance - Patient 50 - 74%    Lower Body Dressing/Undressing Lower body dressing      What is the patient wearing?: Incontinence brief, Pants     Lower body assist Assist for lower body dressing: Total Assistance - Patient < 25%     Toileting Toileting    Toileting assist Assist for toileting: 2 Helpers     Transfers Chair/bed transfer  Transfers assist  Chair/bed transfer activity  did not occur: Safety/medical concerns  Chair/bed transfer assist level: Dependent - mechanical lift (stedy)     Locomotion Ambulation   Ambulation assist   Ambulation activity did not occur: Safety/medical concerns  Assist level: 2 helpers Assistive device: Other (comment) (R hand rail\) Max distance: 19ft   Walk 10 feet activity   Assist  Walk 10 feet activity did not occur: Safety/medical concerns  Assist level: 2 helpers Assistive device: Other (comment) (R hand rail)   Walk 50 feet activity   Assist Walk 50 feet with 2 turns activity did not occur: Safety/medical concerns         Walk 150 feet activity   Assist Walk 150 feet activity did not  occur: Safety/medical concerns         Walk 10 feet on uneven surface  activity   Assist Walk 10 feet on uneven surfaces activity did not occur: Safety/medical concerns         Wheelchair     Assist Is the patient using a wheelchair?: Yes Type of Wheelchair: Manual    Wheelchair assist level: Dependent - Patient 0% Max wheelchair distance: 150    Wheelchair 50 feet with 2 turns activity    Assist        Assist Level: Dependent - Patient 0%   Wheelchair 150 feet activity     Assist      Assist Level: Dependent - Patient 0%   Blood pressure 100/84, pulse 70, temperature 97.6 F (36.4 C), temperature source Oral, resp. rate 16, height 5' 3.5 (1.613 m), weight 88.6 kg, SpO2 92%.  Medical Problem List and Plan: 1. 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 Pt is s/p IR w TICI2b. 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  Antithrombotics: -DVT/anticoagulation:  Pharmaceutical: Xarelto  20mg  daily             -antiplatelet therapy: N/A 3. Pain Management: Tylenol  prn for pain              --Has been on topamax  for HA in the past.  -pain seems controlled for the most part. Would like to stay with tylenol  if possible given arousal 4. Mood/Behavior/Sleep: LCSW to follow for evaluaton and support.              --trazodone  prn for insomnia.              -antipsychotic agents: N/A 5. Neuropsych/cognition: This patient is capable of making decisions on her own behalf.  Cont amantadine  100mg  BID 9/4     6. Skin/Wound Care: Routine pressure relief measures.  7. Fluids/Electrolytes/Nutrition: Monitor I/O. Add CM restrictions to diet             -Post stroke Dysphagia --SLP following. D2 diet/thins            -encourage  PO, protein supp for low albumin  8. A fib: Monitor HR TID-- Metoprolol  succinate 200 mg daily  --Continue Xarelto .  9. HTN: Monitor BP TID. On metoprolol  and aldactone  25mg  daily  -BP stable, monitor Vitals:   05/19/24 1411 05/19/24 1951  05/20/24 0428 05/20/24 1519  BP: 109/70 122/76 114/77 115/81   05/20/24 1949 05/21/24 0455 05/21/24 1429 05/21/24 2052  BP: 103/68 107/71 97/73 110/70   05/22/24 0544 05/22/24 1547 05/22/24 1915 05/23/24 0502  BP: 128/73 101/67 106/85 100/84     10. NICM w/HFrEF: Discussed with Cardiology Dr. Maceo. Digoxin  and metoprolol  tartrate was stooped. Entresto  not continued. She is continued on spironolactone  and Metoprolol  succinate 200mg  daily in the AM. Trying to keep regimen simple. Appreciate the follow up 11.  Pre-diabetes: had been On Farxiga . Added CM restrictions to diet.  --Monitor BS ac/hs and use SSI tor elevated BS.  Controlled 9/5 only daily checks -9/6-7/25 CBGs fine, could consider d/c checks if continued stability  CBG (last 3)  Recent Labs    05/21/24 0528 05/22/24 0547 05/23/24 0529  GLUCAP 106* 108* 101*    12. HLD- LDL 109, crestor  40 mg daily  13. Obesity:  BMI 34.84 educate on diet and weight loss to promote overall health and mobility.  14. Leukocytosis: no s/s infection, resolved on abx 9/8    Latest Ref Rng & Units 05/23/2024    5:10 AM 05/19/2024    5:19 AM 05/16/2024    5:38 AM  CBC  WBC 4.0 - 10.5 K/uL 9.7  10.9  13.6   Hemoglobin 12.0 - 15.0 g/dL 86.0  86.2  85.7   Hematocrit 36.0 - 46.0 % 43.7  42.6  44.7   Platelets 150 - 400 K/uL 200  253  283      15. Slow transit constipation:   -miralax  scheduled daily with prns -05/22/24 LBM 2d ago per pt, but not documented since 9/4, monitor for now, if none by tomorrow would advance meds.   16. Cloudy malodorous urine per nursing 9/6, nursing also reported frequency/urge to urinate after emptying-- U/A shows WBC but no bact, UCx showing >100K e coli, Allergy to PCN, now on  Keflex  500mg  BID pnd sens  LOS: 14 days A FACE TO FACE EVALUATION WAS PERFORMED  Prentice FORBES Compton 05/23/2024, 9:56 AM

## 2024-05-24 DIAGNOSIS — I63521 Cerebral infarction due to unspecified occlusion or stenosis of right anterior cerebral artery: Secondary | ICD-10-CM | POA: Diagnosis not present

## 2024-05-24 LAB — GLUCOSE, CAPILLARY: Glucose-Capillary: 117 mg/dL — ABNORMAL HIGH (ref 70–99)

## 2024-05-24 LAB — URINE CULTURE: Culture: >=100000 — AB

## 2024-05-24 MED ORDER — ENSURE PLUS HIGH PROTEIN PO LIQD
237.0000 mL | Freq: Two times a day (BID) | ORAL | Status: DC
  Administered 2024-05-24 – 2024-05-28 (×2): 237 mL via ORAL

## 2024-05-24 NOTE — Progress Notes (Signed)
 Occupational Therapy Weekly Progress Note  Patient Details  Name: [name] MRN: [id number] Date of Birth: November 25, 1965  Beginning of progress report period: May 17, 2024 End of progress report period: May 24, 2024  Today's Date: 05/24/2024  Patient has met 4 of 4 short term goals. Pt making excellent progress towards OT goals this reporting period. Pt completes stand pivot transfers with MODA. Pt completes UB ADLs with MODA and LB ADLS with MAX A. Pt has trace activation in L biceps and trace activation in L shoulder. Began family ed on 9/9 with daughters but awaiting formal training in the upcoming week. DC set for 9/23.   Patient continues to demonstrate the following deficits: {impairments:3041632} and therefore will continue to benefit from skilled OT intervention to enhance overall performance with {ADL/iADL:3041649}.  Patient {LTG progression:3041653}.  {plan of rjmz:6958345}  OT Short Term Goals Week 1:  OT Short Term Goal 1 (Week 1): Pt will maintain static sit EOB with CGA to supervision to engage in UB self care safely. OT Short Term Goal 1 - Progress (Week 1): Met OT Short Term Goal 2 (Week 1): Pt will complete squat pivot transfers from bed >< w/c with mod A or less with 1 person to work towards a safe toilet transfers. OT Short Term Goal 2 - Progress (Week 1): Progressing toward goal OT Short Term Goal 3 (Week 1): Pt will demonstrate improved L arm awareness to attend to arm during bed mobility tasks with min cues. OT Short Term Goal 3 - Progress (Week 1): Met OT Short Term Goal 4 (Week 1): Pt will be able to don pull over shirt with mod A. OT Short Term Goal 4 - Progress (Week 1): Met Week 2:  OT Short Term Goal 1 (Week 2): Pt will complete squat pivot toilet transfers with mod A or less. OT Short Term Goal 1 - Progress (Week 2): Met OT Short Term Goal 2 (Week 2): Pt will be able to hold static stand with min A to enable caregiver to adjust clothing over hips. OT  Short Term Goal 2 - Progress (Week 2): Met OT Short Term Goal 3 (Week 2): pt will be able to reach feet with long sponge to wash feet. OT Short Term Goal 3 - Progress (Week 2): Met OT Short Term Goal 4 (Week 2): pt will complete self ROM for LUE with min A. OT Short Term Goal 4 - Progress (Week 2): Met Week 3:  OT Short Term Goal 1 (Week 3): pt will complete UB Dressing with MIN A OT Short Term Goal 2 (Week 3): pt will complete 1/3 toileting tasks with MODA OT Short Term Goal 3 (Week 3): pt will complete one grooming task at sink MODI    Therapy Documentation Precautions:  Precautions Precautions: Fall Recall of Precautions/Restrictions: Impaired Restrictions Weight Bearing Restrictions Per Provider Order: No    Therapy/Group: Individual Therapy  Ronal Gift [facility] 05/24/2024, 11:31 AM

## 2024-05-24 NOTE — Progress Notes (Signed)
 Initial Nutrition Assessment  DOCUMENTATION CODES:   Obesity unspecified  INTERVENTION:  Ensure Plus High Protein po BID, each supplement provides 350 kcal and 20 grams of protein.  Magic cup TID with meals, each supplement provides 290 kcal and 9 grams of protein  Encouraged adequate PO intake that helps meet increased calorie and protein needs related to participation in therapy sessions  Monitor bowel movement status and any adjustments needed to bowel regimen  NUTRITION DIAGNOSIS:   Increased nutrient needs related to  (rehab therapy participation) as evidenced by estimated needs.  GOAL:   Patient will meet greater than or equal to 90% of their needs  MONITOR:   PO intake, Supplement acceptance  REASON FOR ASSESSMENT:   Consult Diet education  ASSESSMENT:   Pt with hx HTN, diabetes, stroke (2019), and atrial fibrillation. Recent admission for code stroke 05/05/24-05/09/24 and experienced functional deficits including dysphagia. Admitted to CIR for rehab of deficits and continued work with SLP.  8/25 admitted to CIR 9/4 upgraded to DYS 2  Plan for upgrade to DYS 3 tomorrow 9/10 depending on progress made with SLP.   Spoke with pt who was sitting in bedside chair at time of assessment. Pt's appetite appears poor, but pt reports it is at her baseline. Diet summary documentation shows average meal completion of 14% over last 8 recorded meals. Pt states she usually does not eat breakfast, only drinks coffee. Then she will eat more for lunch and dinner. Pt endorses feeling fatigue during some therapy sessions. Discussed importance of adequate intake to help energy levels during increased activity. Encouraged pt to increase PO intake to help meet calorie and protein needs, pt agreeable.  Pt excited that diet has been advanced to DYS 3 after progress made with SLP. Suspect diet advancement may help with intake. Pt also experiencing constipation which could be a factor  contributing to poor intake. Currently on miralax  for bowel regimen but will monitor for any bowel movements and assess if further treatment is needed. Pt agreeable to try ONS Ensure and magic cups.  Pt's wt has remained stable since admission, but will need updated wt in chart recorded. Pt has not noticed any recent wt loss and physical exam shows adequate fat and muscle stores.  Average Meal Completion: 9/6-9/9: 14% average intake x 8 recorded meals  Medications reviewed and include:  SSI Novolog  1x/day Miralax   Labs reviewed:  CBG x 24 hr: 101-117 mg/dL J8r 6.1  NUTRITION - FOCUSED PHYSICAL EXAM:  Flowsheet Row Most Recent Value  Orbital Region No depletion  Upper Arm Region No depletion  Thoracic and Lumbar Region No depletion  Buccal Region No depletion  Temple Region No depletion  Clavicle Bone Region No depletion  Clavicle and Acromion Bone Region No depletion  Scapular Bone Region No depletion  Dorsal Hand No depletion  Patellar Region No depletion  Anterior Thigh Region No depletion  Posterior Calf Region No depletion  Edema (RD Assessment) None  Hair Reviewed  Eyes Reviewed  Mouth Reviewed  Skin Reviewed  Nails Reviewed    Diet Order:   Diet Order             DIET DYS 3 Room service appropriate? Yes; Fluid consistency: Thin  Diet effective now           DIET DYS 2 Room service appropriate? Yes; Fluid consistency: Thin  Diet effective now                   EDUCATION  NEEDS:   Education needs have been addressed  Skin:  Skin Assessment: Reviewed RN Assessment  Last BM:  9/4  Height:   Ht Readings from Last 1 Encounters:  05/09/24 5' 3.5 (1.613 m)    Weight:   Wt Readings from Last 1 Encounters:  05/09/24 88.6 kg    Ideal Body Weight:  53.4 kg  BMI:  Body mass index is 34.06 kg/m.  Estimated Nutritional Needs:   Kcal:  1600-1800  Protein:  65-80g  Fluid:  1.6-1.8L    Josette Glance, MS, RDN, LDN Clinical Dietitian  I Please reach out via secure chat

## 2024-05-24 NOTE — Progress Notes (Signed)
 Speech Language Pathology Weekly Progress and Session Note  Patient Details  Name: [name] MRN: [id number] Date of Birth: [birth year]  Beginning of progress report period: May 17, 2024 End of progress report period: May 24, 2024  Today's Date: 05/24/2024 SLP Individual Time: 0800-0859 SLP Individual Time Calculation (min): 59 min  Short Term Goals: Week 2: SLP Short Term Goal 1 (Week 2): Patient will consume least restrictive diet with use of swallowing compensatory strategies given supervision multimodal A SLP Short Term Goal 1 - Progress (Week 2): Met SLP Short Term Goal 2 (Week 2): Patient will demonstrate orientation to self, location, situation and time given min multimodal A SLP Short Term Goal 2 - Progress (Week 2): Met SLP Short Term Goal 3 (Week 2): Patient will demonstrate attention to the L during functional tasks given mod multimodal A SLP Short Term Goal 3 - Progress (Week 2): Not met SLP Short Term Goal 4 (Week 2): Patient will recall activities completed in prior therapy sessions given mod multimodal A SLP Short Term Goal 4 - Progress (Week 2): Met    New Short Term Goals: Week 3: SLP Short Term Goal 1 (Week 3): Patient will consume D3/thin with use of swallowing compensatory strategies given supervision multimodal A SLP Short Term Goal 2 (Week 3): Patient will demonstrate orientation to self, location, situation and time given supervision multimodal A SLP Short Term Goal 3 (Week 3): Patient will demonstrate attention to the L during functional tasks given mod multimodal A SLP Short Term Goal 4 (Week 3): Patient will recall activities completed in prior therapy sessions given min multimodal A  Weekly Progress Updates: Pt has made good gains and has met 3 of 4 STGs this reporting period due to improved cognition and dysphagia. Currently, patient continues to require mod-max A for L attention during functional tasks, min-modA for memory and supervision-min A for  use of compensatory strategies during D2/thin diet.  Pt/family eduction ongoing. Pt would benefit from continued ST intervention to maximize cognition and dysphagia in order to maximize functional independence at d/c.    Intensity: Minumum of 1-2 x/day, 30 to 90 minutes Frequency: 3 to 5 out of 7 days Duration/Length of Stay: 9/23 Treatment/Interventions: Cognitive remediation/compensation;Speech/Language facilitation;Functional tasks;Therapeutic Activities;Internal/external aids;Therapeutic Exercise;Dysphagia/aspiration precaution training;Patient/family education   Daily Session  Skilled Therapeutic Interventions:  Skilled therapy session focused on dysphagia and cognitive goals. SLP facilitated session by observing patient with D2/thin liquid breakfast tray and D3 solids. Patient with mildly prolonged mastication and use of liquid wash and lingual sweep with x1 cue. No s/sx of aspiration. Recommend continuation of D2/thin liquids with continued full supervision and trial tray of D3 solids tomorrow. SLP targeted cognitive skills through functional L attention tasks. Patient read schedule with modA and large wording. SLP continued to challenge patient by prompting her to identify letters and numbers according to verbalized directions. Patient highlighted correct letters with modA. Patient oriented to self, situation, location and month/date/year independently. Patient left in bed with alarm set and call bell in reach. Continue POC     Pain None reported   Therapy/Group: Individual Therapy  Adrain Nesbit M.A., CCC-SLP 05/24/2024, 8:49 AM

## 2024-05-24 NOTE — Progress Notes (Signed)
 PROGRESS NOTE   Subjective/Complaints:  No c/o today , denies LUE pain , discussed early timeframe of recovery   ROS: Limited due to cognitive/behavioral but denies CP, SOB, abd pain, n/v/d/c    Objective:   No results found. Recent Labs    05/23/24 0510  WBC 9.7  HGB 13.9  HCT 43.7  PLT 200     Recent Labs    05/23/24 1812  NA 136  K 4.0  CL 105  CO2 19*  GLUCOSE 143*  BUN 16  CREATININE 1.38*  CALCIUM  9.7      Intake/Output Summary (Last 24 hours) at 05/24/2024 0858 Last data filed at 05/23/2024 2059 Gross per 24 hour  Intake 360 ml  Output --  Net 360 ml        Physical Exam: Vital Signs Blood pressure 103/63, pulse 78, temperature 98 F (36.7 C), temperature source Oral, resp. rate 16, height 5' 3.5 (1.613 m), weight 88.6 kg, SpO2 98%.   General: No acute distress, resting comfortably in bed. Mood and affect are appropriate Heart: Regular rate and rhythm no rubs murmurs or extra sounds Lungs: Clear to auscultation, breathing unlabored, no rales or wheezes Abdomen: Positive bowel sounds, soft nontender to palpation, nondistended Extremities: No clubbing, cyanosis, or edema Skin: No evidence of breakdown, no evidence of rash over exposed surfaces Neurologic: Cranial nerves II through XII intact, motor strength is 5/5 in R deltoid, bicep, tricep, grip, hip flexor, knee extensors, ankle dorsiflexor and plantar flexor  Neuro:  much more alert today. . Right gaze preference ongoing but can be cued to left verbally. Follows basic commands.  speech sl dysarthric, left central VII. MMT:LUE 0/5. trace Left elbow flexion and Hip Ext LLE 2- with knee ext synergy pattern  . Moves right side freely. Senses pain and light touch in all 4's. LUE DTR's 1+. LLE DTR's brisk 3+ with early extensor tone, toes up. Increasing left pectoralis and elbow flexor tone MAS 2/3   + triple flexor Musculoskeletal: no pain with  AROM/PROM  No swelling LLE, no hamstrings or adductor pain    Assessment/Plan: 1. Functional deficits which require 3+ hours per day of interdisciplinary therapy in a comprehensive inpatient rehab setting. Physiatrist is providing close team supervision and 24 hour management of active medical problems listed below. Physiatrist and rehab team continue to assess barriers to discharge/monitor patient progress toward functional and medical goals  Care Tool:  Bathing    Body parts bathed by patient: Abdomen, Front perineal area, Left arm, Face, Chest, Right upper leg, Left upper leg   Body parts bathed by helper: Right lower leg, Left lower leg, Right arm     Bathing assist Assist Level: Moderate Assistance - Patient 50 - 74%     Upper Body Dressing/Undressing Upper body dressing   What is the patient wearing?: Pull over shirt    Upper body assist Assist Level: Moderate Assistance - Patient 50 - 74%    Lower Body Dressing/Undressing Lower body dressing      What is the patient wearing?: Incontinence brief, Pants     Lower body assist Assist for lower body dressing: Total Assistance - Patient < 25%  Toileting Toileting    Toileting assist Assist for toileting: 2 Helpers     Transfers Chair/bed transfer  Transfers assist  Chair/bed transfer activity did not occur: Safety/medical concerns  Chair/bed transfer assist level: Dependent - mechanical lift (stedy)     Locomotion Ambulation   Ambulation assist   Ambulation activity did not occur: Safety/medical concerns  Assist level: 2 helpers (for w/c follow) Assistive device: Other (comment) (R Hand rail) Max distance: 54ft   Walk 10 feet activity   Assist  Walk 10 feet activity did not occur: Safety/medical concerns  Assist level: 2 helpers Assistive device: Other (comment) (R Hand rail)   Walk 50 feet activity   Assist Walk 50 feet with 2 turns activity did not occur: Safety/medical concerns          Walk 150 feet activity   Assist Walk 150 feet activity did not occur: Safety/medical concerns         Walk 10 feet on uneven surface  activity   Assist Walk 10 feet on uneven surfaces activity did not occur: Safety/medical concerns         Wheelchair     Assist Is the patient using a wheelchair?: Yes Type of Wheelchair: Manual    Wheelchair assist level: Dependent - Patient 0% Max wheelchair distance: 150    Wheelchair 50 feet with 2 turns activity    Assist        Assist Level: Dependent - Patient 0%   Wheelchair 150 feet activity     Assist      Assist Level: Dependent - Patient 0%   Blood pressure 103/63, pulse 78, temperature 98 F (36.7 C), temperature source Oral, resp. rate 16, height 5' 3.5 (1.613 m), weight 88.6 kg, SpO2 98%.  Medical Problem List and Plan: 1. 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 Pt is s/p IR w TICI2b. 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  Antithrombotics: -DVT/anticoagulation:  Pharmaceutical: Xarelto  20mg  daily             -antiplatelet therapy: N/A 3. Pain Management: Tylenol  prn for pain              --Has been on topamax  for HA in the past.  -pain seems controlled for the most part. Would like to stay with tylenol  if possible given arousal 4. Mood/Behavior/Sleep: LCSW to follow for evaluaton and support.              --trazodone  prn for insomnia.              -antipsychotic agents: N/A 5. Neuropsych/cognition: This patient is capable of making decisions on her own behalf.  Cont amantadine  100mg  BID 9/4     6. Skin/Wound Care: Routine pressure relief measures.  7. Fluids/Electrolytes/Nutrition: Monitor I/O. Add CM restrictions to diet             -Post  stroke Dysphagia --SLP following. D2 diet/thins            -encourage PO, protein supp for low albumin  8. A fib: Monitor HR TID-- Metoprolol  succinate  200 mg daily  --Continue Xarelto .  9. HTN: Monitor BP TID. On metoprolol  and aldactone  25mg  daily  -BP stable, monitor Vitals:   05/20/24 1519 05/20/24 1949 05/21/24 0455 05/21/24 1429  BP: 115/81 103/68 107/71 97/73   05/21/24 2052 05/22/24 0544 05/22/24 1547 05/22/24 1915  BP: 110/70 128/73 101/67 106/85   05/23/24 0502 05/23/24 1345 05/23/24 1945 05/24/24 0543  BP: 100/84 109/76 119/74 103/63     10. NICM w/HFrEF: Discussed with Cardiology Dr. Maceo. Digoxin  and metoprolol  tartrate was stooped. Entresto  not continued. She is continued on spironolactone  and Metoprolol  succinate 200mg  daily in the AM. Trying to keep regimen simple. Appreciate the follow up 11.  Pre-diabetes: had been On Farxiga . Added CM restrictions to diet.  --Monitor BS ac/hs and use SSI tor elevated BS.  Controlled 9/5 only daily checks -9/6-7/25 CBGs fine, could consider d/c checks if continued stability  CBG (last 3)  Recent Labs    05/22/24 0547 05/23/24 0529 05/24/24 0556  GLUCAP 108* 101* 117*    12. HLD- LDL 109, crestor  40 mg daily  13. Obesity:  BMI 34.84 educate on diet and weight loss to promote overall health and mobility.  14. Leukocytosis: no s/s infection, resolved on abx 9/8    Latest Ref Rng & Units 05/23/2024    5:10 AM 05/19/2024    5:19 AM 05/16/2024    5:38 AM  CBC  WBC 4.0 - 10.5 K/uL 9.7  10.9  13.6   Hemoglobin 12.0 - 15.0 g/dL 86.0  86.2  85.7   Hematocrit 36.0 - 46.0 % 43.7  42.6  44.7   Platelets 150 - 400 K/uL 200  253  283      15. Slow transit constipation:   -miralax  scheduled daily with prns -05/22/24 LBM 2d ago per pt, but not documented since 9/4, monitor for now, if none by tomorrow would advance meds.   16. Cloudy malodorous urine per nursing 9/6, nursing also reported frequency/urge to urinate after emptying-- U/A shows  WBC but no bact, UCx showing >100K e coli, Allergy to PCN, now on Keflex  500mg  BID pnd sens  LOS: 15 days A FACE TO FACE EVALUATION WAS PERFORMED  [name] 05/24/2024, 8:58 AM

## 2024-05-24 NOTE — Progress Notes (Signed)
 Physical Therapy Session Note  Patient Details  Name: [name] MRN: [id number] Date of Birth: 11-28-1965  Today's Date: 05/24/2024 PT Individual Time: 1120-1200; 1401 - 1431 PT Individual Time Calculation (min): 40 min; 30 min   Short Term Goals: Week 2:  PT Short Term Goal 1 (Week 2): Pt will perform mod A squat pivot transfers bed <> chair PT Short Term Goal 2 (Week 2): During transfers, pt will maintain head/hip relationship with min verbal cues PT Short Term Goal 3 (Week 2): Pt will progress gait x 35' with mod A to advance LLE PT Short Term Goal 4 (Week 2): Pt will demonstrate dynamic standing balance without RUE support with mod A  SESSION 1 Skilled Therapeutic Interventions/Progress Updates: Patient sitting in TIS with family present on entrance to room. Patient alert and agreeable to PT session.   Patient reported no pain during session.  Therapeutic Activity: Transfers: Pt performed sit<>stand  transfers throughout session in preparation for mobility with R UE support on railing. Pt required overall CGA. Provided VC to ensure B feet flat on floor in stand ready position (feet under knees with anterior lean).  Gait Training:  - Pt ambulated roughly 30' x 2 with PTA on L facilitating L LE advancement through gait cycle. Pt required modA and cued to increase L hip flexion with PTA providing anterior hip translation roughly 50% of the time. Pt also cued to increase R step length to improve L hip flexor recruitment. Pt with improved advancement of L LE through swing phase on 2nd round, especially after adhering to cue to increase step length on R LE with heel strike. Pt with modA on 2nd round. Pt required increased time to cover distances and also required VC/manual facilitation of weight shift accordingly.   Patient sitting in TIS at end of session with brakes locked, family present, belt alarm set, and all needs within reach.  SESSION 2 Skilled Therapeutic Interventions/Progress  Updates: Patient finishing up with personal care with nsg and vitals with nsg students on entrance to room. Patient alert and agreeable to PT session.   Patient reported no pain during session.  Therapeutic Activity: Transfers: Pt performed multiple sit<>stand transfers from TIS<>edge of mat to the R with minA + 2 with VC for step placement during pivot. Pt required manual facilitation of weight shift to R/L. Pt attempted transfer with L LE leading, but unable to safely coordinate/advance L LE towards transfer surface, where as pt could initiate advancement when R LE leading and pt can slide L LE (decreased knee flexion/hip flexion). Pt performed transfer with only PTA on L side with pt transferring to R with modA (pt did so following NMRE). Pt with slightly notable improvement in L LE retro step with knee flexion. Pt presented with hesitation to advance R LE this last time due to only having HHA on affected UE vs on R UE. Pt required target cuing throughout to step LE to for safe pivot  Neuromuscular Re-ed: - Muscle Energy Technique to promote stretch reflex and increased L knee flexor activation for improved strength and neuromuscular control with some noted activation to occur. 2 rounds performed with PTA providing very light minA (decrease in knee flexor activation after few degrees of full knee extension).   NMR performed for improvements in motor control and coordination, balance, sequencing, judgement, and self confidence/ efficacy in performing all aspects of mobility at highest level of independence.   Patient sitting in TIS at end of session with brakes  locked, family present, belt alarm set, and all needs within reach.       Therapy Documentation Precautions:  Precautions Precautions: Fall Recall of Precautions/Restrictions: Impaired Restrictions Weight Bearing Restrictions Per Provider Order: No  Therapy/Group: Individual Therapy  Chrystel Barefield PTA 05/24/2024, 1:59 PM

## 2024-05-24 NOTE — Progress Notes (Signed)
 Occupational Therapy Session Note  Patient Details  Name: [name] MRN: [id number] Date of Birth: 09-Feb-1966  Today's Date: 05/24/2024 OT Individual Time: 1010-1108 OT Individual Time Calculation (min): 58 min    Short Term Goals: Week 2:  OT Short Term Goal 1 (Week 2): Pt will complete squat pivot toilet transfers with mod A or less. OT Short Term Goal 1 - Progress (Week 2): Met OT Short Term Goal 2 (Week 2): Pt will be able to hold static stand with min A to enable caregiver to adjust clothing over hips. OT Short Term Goal 2 - Progress (Week 2): Met OT Short Term Goal 3 (Week 2): pt will be able to reach feet with long sponge to wash feet. OT Short Term Goal 3 - Progress (Week 2): Met OT Short Term Goal 4 (Week 2): pt will complete self ROM for LUE with min A. OT Short Term Goal 4 - Progress (Week 2): Met  Skilled Therapeutic Interventions/Progress Updates:  Pt greeted supine in bed, pt agreeable to OT intervention.      Transfers/bed mobility/functional mobility:  Pt completed supine>sit with MODA to manage LLE and trunk. Pt completed stand pivot transfer to R side with MIN A +2. Pt completed multiple sit>stands during session with MIN A with LLE blocked and LUE supported.   ADLs:  Grooming: pt completed seated oral care at sink with set- up assist.  LB dressing: donned pants from EOB with MAX A. Pt able to stand with MIN A while therapist pulled pants to waist line Footwear: donned socks/shoes with total A for time mgmt  Education: daughters present during session therefore began preliminary family ed on transfer training. Demo'ed stand pivot transfer from w/c>mat to R side with education provided on caregiver positioning and handling techniques as well as use of gait belt. Discussed importance of guarding her L side and blocking LLE during transfer. Pt also demo'ed sit>stands from mat with MIN A, education provided on positioning for sit>stands from ADL perspective to provide  assist for LB ADLs.    NMR: positioned LUE in saeboMAS ( mobile arm support) with a focus on gravity eliminated active assist ROM. Pt able to demo active scapular retraction in gravity eliminated position. Donned saebo estim at L bicep + arm support at below settings for bicep activation to simulate functional reaching.   no c/o pain before/after tx and no adverse skin reactions. donned for 15 mins at level 3.  330 pulse width 35 Hz pulse rate On 8 sec/ off 8 sec Ramp up/ down 2 sec Symmetrical Biphasic wave form  Max intensity at 500 Ohm load                  Ended session with pt seated in w/c with all needs within reach and daughter present.  Therapy Documentation Precautions:  Precautions Precautions: Fall Recall of Precautions/Restrictions: Impaired Restrictions Weight Bearing Restrictions Per Provider Order: No  Pain: No pain    Therapy/Group: Individual Therapy  Ronal Gift [facility] 05/24/2024, 11:35 AM

## 2024-05-25 ENCOUNTER — Inpatient Hospital Stay (HOSPITAL_COMMUNITY)

## 2024-05-25 DIAGNOSIS — I739 Peripheral vascular disease, unspecified: Secondary | ICD-10-CM | POA: Diagnosis not present

## 2024-05-25 DIAGNOSIS — I63521 Cerebral infarction due to unspecified occlusion or stenosis of right anterior cerebral artery: Secondary | ICD-10-CM | POA: Diagnosis not present

## 2024-05-25 LAB — GLUCOSE, CAPILLARY: Glucose-Capillary: 110 mg/dL — ABNORMAL HIGH (ref 70–99)

## 2024-05-25 LAB — VAS US ABI WITH/WO TBI
Left ABI: 0.82
Right ABI: 0.73

## 2024-05-25 NOTE — Progress Notes (Signed)
 Speech Language Pathology Daily Session Note  Patient Details  Name: [name] MRN: [id number] Date of Birth: 1966-04-14  Today's Date: 05/25/2024 SLP Individual Time: 9184-9086 SLP Individual Time Calculation (min): 58 min  Short Term Goals: Week 3: SLP Short Term Goal 1 (Week 3): Patient will consume D3/thin with use of swallowing compensatory strategies given supervision multimodal A SLP Short Term Goal 2 (Week 3): Patient will demonstrate orientation to self, location, situation and time given supervision multimodal A SLP Short Term Goal 3 (Week 3): Patient will demonstrate attention to the L during functional tasks given mod multimodal A SLP Short Term Goal 4 (Week 3): Patient will recall activities completed in prior therapy sessions given min multimodal A  Skilled Therapeutic Interventions: Skilled therapy session focused on cognitive and dysphagia goals. Upon entrance, patient read schedule with supervision A to attend to the L and patient independently oriented to self, situation, location and time (date & day). SLP targeted dysphagia goals through observing upgraded D3/thin breakfast tray. Patient with mildly prolonged mastication and mild oral residuals cleared through minA for liquid wash and lingual sweep. Patient with x1 immediate cough after cup sip of coffee, though no other s/sx of aspiration. Recommend upgrade to D3/thin with continued full supervision at this time. At the end of the session, SLP provided mod-maxA for patient to review and choose items for lunch/dinner from the menu. Patient required mod occasionally maxA to attend to the L side of the page.  Patient left in bed with alarm set and call bell in reach. Continue POC.  Pain None reported   Therapy/Group: Individual Therapy  Carolyne Whitsel M.A., CCC-SLP 05/25/2024, 7:45 AM

## 2024-05-25 NOTE — Progress Notes (Signed)
 PROGRESS NOTE   Subjective/Complaints:  No issues overnite , pt had some RIght ball of foot pain yesterday.  No hx of injury , has had this intermittently for a few days, feels swollen  ROS: Limited due to cognitive/behavioral but denies CP, SOB, abd pain, n/v/d/c    Objective:   No results found. Recent Labs    05/23/24 0510  WBC 9.7  HGB 13.9  HCT 43.7  PLT 200     Recent Labs    05/23/24 1812  NA 136  K 4.0  CL 105  CO2 19*  GLUCOSE 143*  BUN 16  CREATININE 1.38*  CALCIUM  9.7      Intake/Output Summary (Last 24 hours) at 05/25/2024 0913 Last data filed at 05/24/2024 1900 Gross per 24 hour  Intake 170 ml  Output --  Net 170 ml        Physical Exam: Vital Signs Blood pressure 101/62, pulse 74, temperature 97.9 F (36.6 C), temperature source Oral, resp. rate 16, height 5' 3.5 (1.613 m), weight 88.6 kg, SpO2 100%.   General: No acute distress, resting comfortably in bed. Mood and affect are appropriate Heart: Regular rate and rhythm no rubs murmurs or extra sounds Lungs: Clear to auscultation, breathing unlabored, no rales or wheezes Abdomen: Positive bowel sounds, soft nontender to palpation, nondistended Extremities: No clubbing, cyanosis, or edema Skin: No evidence of breakdown, no evidence of rash over exposed surfaces Neurologic: Cranial nerves II through XII intact, motor strength is 5/5 in R deltoid, bicep, tricep, grip, hip flexor, knee extensors, ankle dorsiflexor and plantar flexor  Neuro:  much more alert today. . Right gaze preference ongoing but can be cued to left verbally. Follows basic commands.  speech sl dysarthric, left central VII. MMT:LUE 0/5. trace Left elbow flexion and Hip Ext LLE 2- with knee ext synergy pattern  . Moves right side freely. Senses pain and light touch in all 4's. LUE DTR's 1+. LLE DTR's brisk 3+ with early extensor tone, toes up. Increasing left pectoralis  and elbow flexor tone MAS 2/3   + triple flexor Musculoskeletal: no pain with AROM/PROM  No swelling LLE, no hamstrings or adductor pain  RIgh tfoot no swelling , pulses  thready but foot is warm, no skin lesions , normal sensation , no pain with toe foot or ankle ROM   Assessment/Plan: 1. Functional deficits which require 3+ hours per day of interdisciplinary therapy in a comprehensive inpatient rehab setting. Physiatrist is providing close team supervision and 24 hour management of active medical problems listed below. Physiatrist and rehab team continue to assess barriers to discharge/monitor patient progress toward functional and medical goals  Care Tool:  Bathing    Body parts bathed by patient: Abdomen, Front perineal area, Left arm, Face, Chest, Right upper leg, Left upper leg   Body parts bathed by helper: Right lower leg, Left lower leg, Right arm     Bathing assist Assist Level: Moderate Assistance - Patient 50 - 74%     Upper Body Dressing/Undressing Upper body dressing   What is the patient wearing?: Pull over shirt    Upper body assist Assist Level: Moderate Assistance - Patient 50 -  74%    Lower Body Dressing/Undressing Lower body dressing      What is the patient wearing?: Incontinence brief, Pants     Lower body assist Assist for lower body dressing: Total Assistance - Patient < 25%     Toileting Toileting    Toileting assist Assist for toileting: 2 Helpers     Transfers Chair/bed transfer  Transfers assist  Chair/bed transfer activity did not occur: Safety/medical concerns  Chair/bed transfer assist level: Dependent - mechanical lift (stedy)     Locomotion Ambulation   Ambulation assist   Ambulation activity did not occur: Safety/medical concerns  Assist level: 2 helpers (for w/c follow) Assistive device: Other (comment) (R Hand rail) Max distance: 5ft   Walk 10 feet activity   Assist  Walk 10 feet activity did not occur:  Safety/medical concerns  Assist level: 2 helpers Assistive device: Other (comment) (R Hand rail)   Walk 50 feet activity   Assist Walk 50 feet with 2 turns activity did not occur: Safety/medical concerns         Walk 150 feet activity   Assist Walk 150 feet activity did not occur: Safety/medical concerns         Walk 10 feet on uneven surface  activity   Assist Walk 10 feet on uneven surfaces activity did not occur: Safety/medical concerns         Wheelchair     Assist Is the patient using a wheelchair?: Yes Type of Wheelchair: Manual    Wheelchair assist level: Dependent - Patient 0% Max wheelchair distance: 150    Wheelchair 50 feet with 2 turns activity    Assist        Assist Level: Dependent - Patient 0%   Wheelchair 150 feet activity     Assist      Assist Level: Dependent - Patient 0%   Blood pressure 101/62, pulse 74, temperature 97.9 F (36.6 C), temperature source Oral, resp. rate 16, height 5' 3.5 (1.613 m), weight 88.6 kg, SpO2 100%.  Medical Problem List and Plan: 1. 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 Pt is s/p IR w TICI2b. 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 Formulized individual treatment plan based on medical history, underlying problem and comorbidities.   -PRAFO and WHO to help maintain hand and foot positioning 2.  Antithrombotics: -DVT/anticoagulation:  Pharmaceutical: Xarelto  20mg  daily             -antiplatelet therapy: N/A 3. Pain Management: Tylenol  prn for pain              --Has been on topamax  for HA in the past.  -pain seems controlled for the most part. Would like to stay with tylenol  if  possible given arousal 4. Mood/Behavior/Sleep: LCSW to follow for evaluaton and support.              --trazodone  prn for insomnia.              -antipsychotic agents: N/A 5. Neuropsych/cognition: This patient is capable of making decisions on her  own behalf.  Cont amantadine  100mg  BID 9/4     6. Skin/Wound Care: Routine pressure relief measures.  7. Fluids/Electrolytes/Nutrition: Monitor I/O. Add CM restrictions to diet             -Post stroke Dysphagia --SLP following. D2 diet/thins            -encourage PO, protein supp for low albumin  8. A fib: Monitor HR TID-- Metoprolol  succinate 200 mg daily  --Continue Xarelto .  9. HTN: Monitor BP TID. On metoprolol  and aldactone  25mg  daily  -BP stable, monitor Vitals:   05/21/24 2052 05/22/24 0544 05/22/24 1547 05/22/24 1915  BP: 110/70 128/73 101/67 106/85   05/23/24 0502 05/23/24 1345 05/23/24 1945 05/24/24 0543  BP: 100/84 109/76 119/74 103/63   05/24/24 1400 05/24/24 2011 05/25/24 0432 05/25/24 0812  BP: 117/75 (!) 158/90 96/72 101/62     10. NICM w/HFrEF: Discussed with Cardiology Dr. Maceo. Digoxin  and metoprolol  tartrate was stooped. Entresto  not continued. She is continued on spironolactone  and Metoprolol  succinate 200mg  daily in the AM. Trying to keep regimen simple. Appreciate the follow up 11.  Pre-diabetes: had been On Farxiga . Added CM restrictions to diet.  --Monitor BS ac/hs and use SSI tor elevated BS.  Controlled 9/5 only daily checks -9/6-7/25 CBGs fine, could consider d/c checks if continued stability  CBG (last 3)  Recent Labs    05/23/24 0529 05/24/24 0556 05/25/24 0619  GLUCAP 101* 117* 110*    12. HLD- LDL 109, crestor  40 mg daily  13. Obesity:  BMI 34.84 educate on diet and weight loss to promote overall health and mobility.  14. Leukocytosis: no s/s infection, resolved on abx 9/8    Latest Ref Rng & Units 05/23/2024    5:10 AM 05/19/2024    5:19 AM 05/16/2024    5:38 AM  CBC  WBC 4.0 - 10.5 K/uL 9.7  10.9   13.6   Hemoglobin 12.0 - 15.0 g/dL 86.0  86.2  85.7   Hematocrit 36.0 - 46.0 % 43.7  42.6  44.7   Platelets 150 - 400 K/uL 200  253  283      15. Slow transit constipation:   -miralax  scheduled daily with prns -05/22/24 LBM 2d ago per pt, but not documented since 9/4, monitor for now, if none by tomorrow would advance meds.   16. Cloudy malodorous urine per nursing 9/6, nursing also reported frequency/urge to urinate after emptying-- U/A shows WBC but no bact, UCx showing >100K e coli, sensitive to Keflex  500mg  BID , continue, hx PCN all but tolerated Keflex  in the past  17.  Intermittent Right foot pain , exam normal with exception of reduced pulses will check ABIs due to hx vascular occlusion, on Xarelto  no venous dopplers indicated at this time  LOS: 16 days A FACE TO FACE EVALUATION WAS PERFORMED  [name] 05/25/2024, 9:13 AM

## 2024-05-25 NOTE — Progress Notes (Signed)
 Orthopedic Tech Progress Note Patient Details:  [name] 07/07/1966 [id number]  Called in order to [facility] for an AFO CONSULT    Patient ID: [name], female   DOB: 1966-04-22, 58 y.o.   MRN: [id number]  [name] Pac 05/25/2024, 10:32 AM

## 2024-05-25 NOTE — Patient Care Conference (Signed)
 Inpatient RehabilitationTeam Conference and Plan of Care Update Date: 05/25/2024   Time: 10:16 AM    Patient Name: [name]      Medical Record Number: [id number]  Date of Birth: 1966-04-26 Sex: Female         Room/Bed: 4W22C/4W22C-01 Payor Info: Payor: [place] MEDICA[employer]PA[employer] PLAN / Plan: [place] [employer]ICAID [employer] / Product Type: *No Product type* /    Admit Date/Time:  05/09/2024  5:00 PM  Primary Diagnosis:  Acute right ACA stroke [facility])  Hospital Problems: Principal Problem:   Acute right ACA stroke (HCC) Active Problems:   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 Prentice Compton Social Worker Present: Rhoda Clement, LCSW Nurse Present: Barnie Ronde, RN PT Present: Sherlean Perks, PT;Dominic Freddi, PTA OT Present: Recardo Maxwell, OT SLP Present: Blaise Alderman, SLP     Current Status/Progress Goal Weekly Team Focus  Bowel/Bladder   incontient x2   to return to baseline   to progress    Swallow/Nutrition/ Hydration   D3/thin upgraded this AM   minA  trials of advanced textures, carry over    ADL's   MODA for bathing from shower level, MODA for stand pivot transfers, MODA for UB ADLs, MAX A for LB ADLs.   min A   LUE NMR, L side awareness, visual scanning, postural control and balance, pt/fam education    Mobility   Bed Mobility = maxA, Transfers = CGA to stand, and modA stand pivot without AD (to the R); modA to ambulate 35' with R HA (has improved L LE advancement but still decrease in L hip/knee flexion).   overall minA  Dynamic standing balance, ambulation, WC eval, bed mobility, L NMRE, transfers, family ed, WC mobility    Communication                Safety/Cognition/ Behavioral  Observations  min-modA for verbal tasks, up to maxA for functional L attention   supervision-minA   L attention for functional tasks, memory, orientation    Pain   no pain   no pain   no pain    Skin   intact   to continue to have intact skin  to assess for breakdown      Discharge Planning:  Family members very dedicated to pt and are aware of her 24/7 care needs. Two daughters have taken FMLA and  forms completed for them. Will need speciality wc eval and will work on other DME needs and follow up   Team Discussion: Patient admitted post right ACA/MCA with dense left sided weakness and tone on left side with flexor withdrawal. Progress limited by decreased midline awareness. Postural/truncal control and carry over has improved.  New foot pain reported (ball of foot).  Patient on target to meet rehab goals: yes, currently needs mod assist for ADLs and transfers. Needs mod assist for stand pivot transfers and max assist for toileting.  Needs mod - max assist for complex cognitive tasks and does better wth verbal tasks. Goals for discharge set for min assist overall.  *See Care Plan and progress notes for long and short-term goals.   Revisions to Treatment Plan:  UTI treated Amantadine  trial Ankle brachial plexis studies (OP) recommended  AFO consult  Teaching Needs: Safety, medications,  Current Barriers to Discharge: Decreased caregiver support and Home enviroment access/layout  Possible Resolutions to Barriers: Family education DME: wheelchair Ramp for entry to home recommended     Medical Summary Current Status: dense L Hemi, occ BP, spasticity management  Barriers to Discharge: Medical stability;Spasticity   Possible Resolutions to [employer] Focus: spasticity management, family / caregiver training   Continued Need for Acute Rehabilitation Level of Care: The patient requires daily medical management by a physician with specialized training in physical  medicine and rehabilitation for the following reasons: Direction of a multidisciplinary physical rehabilitation program to maximize functional independence : Yes Medical management of patient stability for increased activity during participation in an intensive rehabilitation regime.: Yes Analysis of laboratory values and/or radiology reports with any subsequent need for medication adjustment and/or medical intervention. : Yes   I attest that I was present, lead the team conference, and concur with the assessment and plan of the team.   Fredericka Sober B 05/25/2024, 5:06 PM

## 2024-05-25 NOTE — Progress Notes (Signed)
 Occupational Therapy Session Note  Patient Details  Name: [name] MRN: [id number] Date of Birth: Feb 11, 1966  Today's Date: 05/25/2024 OT Individual Time: 9052-8899 OT Individual Time Calculation (min): 73 min     Skilled Therapeutic Interventions/Progress Updates: Patient received resting in bed. Agreeable to participate with OT. Motivated to get bathed and OOB. Min assist with UB bathing at bed level. Moderate assist for LB bathing. Patient able to recall hemi dressing technique for UB dressing but needed Mod assist to manage LUE into shirt sleeve and get the sleeve over her elbow.  Continued with LE dressing at bed level Max assist. Patient able to roll and bridge with LLE assist and support to help manage pants over hips. Assisted patient with supine to sitting EOB Mod/Max assist. Good static balance while therapist positioned STEDY. Patient stood at [facility] with Min assist. Able to tolerate standing with CGA while therapist ensure clothing was adjusted comfortably. Patient transferred to tilt in space chair for grooming/ hygiene tasks sitting at the sink. Needed set up assist for items for hair care and brushing teeth but able to perform tasks with her right hand. Continued treatment in the therapy room. Squat pivot transfer to therapy mat Mod assist for mobilization of scapula and GH joint. Patient with good tolerance of mobilization into full shoulder flexion. Able to tolerate IR/ER mobility. Followed mobility with EOM LUE wt. Bearing able to activate triceps with distal humeral support and radial cue to engage LUE in a support position. Patient able to perform squat pivot transfer back to the w/c with Mod assist moving to the left. Patient assisted back to her hospital room with chair alarm, call bell, phone and tray table set up and accessible. Continue with skilled POC to improve independence with BADL's and promote neuro recovery.      Therapy Documentation Precautions:   Precautions Precautions: Fall Recall of Precautions/Restrictions: Impaired Restrictions Weight Bearing Restrictions Per Provider Order: No General:   Vital Signs: Therapy Vitals Pulse Rate: 74 BP: 101/62 Oxygen Therapy SpO2: 100 % O2 Device: Room Air Pain: Pain Assessment Pain Scale: 0-10 Pain Score: 0-No pain    Therapy/Group: Individual Therapy  [name] 05/25/2024, 12:52 PM

## 2024-05-25 NOTE — Progress Notes (Signed)
 Physical Therapy Session Note  Patient Details  Name: [name] MRN: [id number] Date of Birth: 01-27-1966  Today's Date: 05/25/2024 PT Individual Time: 8693-8584 PT Individual Time Calculation (min): 69 min   Short Term Goals: Week 2:  PT Short Term Goal 1 (Week 2): Pt will perform mod A squat pivot transfers bed <> chair PT Short Term Goal 2 (Week 2): During transfers, pt will maintain head/hip relationship with min verbal cues PT Short Term Goal 3 (Week 2): Pt will progress gait x 35' with mod A to advance LLE PT Short Term Goal 4 (Week 2): Pt will demonstrate dynamic standing balance without RUE support with mod A  Skilled Therapeutic Interventions/Progress Updates: Patient sitting in TIS with family present on entrance to room. Patient alert and agreeable to PT session.   Patient reported no pain during session.  - Pt maxA to donn/doff personal shoes and L AFO/shoe cap.  PTA discussed with pt and pt family about ramp access to home (home measurement and ramp instructions provided), as well as upcoming WC evaluation and AFO consult to be scheduled.   Therapeutic Activity: Bed Mobility: Pt performed sit<supine from EOB with maxA (L paretic LE leading). VC required for pt to supinate R forearm and utilize [facility] railing, and then to lean onto R. Shoulder for increased leverage. Pt supine in bed and cued to roll to L via HOB railing and to bridge hips over towards center. Pt daughter stabilized B feet while pt cued to bridge hips over to center with modA required to achieve adequate height to do so. Pt also cue dot extend R elbow into bed to assist with truncal scoot to align with hips. Transfers: Pt performed sit<stand with minA this session due to fatigue, and modA to control descent with VC for hand/LE placement. Pt performed stand pivot transfers during session from TIS<KiMobility WC to the R with modA + 2 with VC for sequence of pivot and manual facilitation required for weight shift. Pt  then transferred from WC<edge of mat with maxA + 2 due to increase in R LE increase in extensor push to the L that required increased time/effort to transfer (tactile cuing to L quadriceps belly required during stance phase, and VC for pt to perform soft bend in R LE to decrease push to L). Pt more hesitant to advance R LE to pivot towards mat that began to improve following multimodal cuing to also utilize hips to assist with retro step. Pt performed transfer from edge of mat<WC with modA + 2 and improved step length and retro step with L LE. Pt performed from WC<EOB (EOB elevated to simulate bed height at home) with use of HOB railing (transfer to the R) and improved sequence overall (closer to minA - pt daughter informed on potential benefits of HOB railing when at home).  Gait Training:  Pt ambulated 32' with RHR with modA. Pt required greater than 50% of the time of L LE to be advanced to step through position (improved when pt adhered to increasing R step length for increased hip flexor muscle fiber recruitment). Pt required anterior hip translation on L greater than 75% of the time. Pt continues to require manual facilitation of B weight shift throughout and to look forward ahead vs at grown to avoid forward flexed posture.   Patient supine in bed at end of session with brakes locked, nsg present, and all needs within reach.      Therapy Documentation Precautions:  Precautions Precautions:  Fall Recall of Precautions/Restrictions: Impaired Restrictions Weight Bearing Restrictions Per Provider Order: No  Therapy/Group: Individual Therapy  Joelly Bolanos PTA 05/25/2024, 3:53 PM

## 2024-05-25 NOTE — Progress Notes (Signed)
 Patient ID: [name], female   DOB: [birth year], 58 y.o.   MRN: [id number] Met with pt, Latoya-daughter and son who was present in her room to give team conference update of progress this week in therapies. She feels she is doing better and is sitting better and this makes her transfers better. Daughter was interested in applying for SSD and will complete form for [facility] for them to assist with the application. Aware discharge date still 9/23. Will have wheelchair evaluation next week and will do hands on education with family and caregivers closer to discharge date. They are here daily and have observed her in therapies.

## 2024-05-25 NOTE — Plan of Care (Signed)
  Problem: Consults Goal: RH STROKE PATIENT EDUCATION Description: See Patient Education module for education specifics  Outcome: Progressing Goal: Nutrition Consult-if indicated Outcome: Progressing Goal: Diabetes Guidelines if Diabetic/Glucose > 140 Description: If diabetic or lab glucose is > 140 mg/dl - Initiate Diabetes/Hyperglycemia Guidelines & Document Interventions  Outcome: Progressing   Problem: RH BOWEL ELIMINATION Goal: RH STG MANAGE BOWEL WITH ASSISTANCE Description: STG Manage Bowel with mod I Assistance. Outcome: Progressing Goal: RH STG MANAGE BOWEL W/MEDICATION W/ASSISTANCE Description: STG Manage Bowel with Medication with mod I Assistance. Outcome: Progressing   Problem: RH SAFETY Goal: RH STG ADHERE TO SAFETY PRECAUTIONS W/ASSISTANCE/DEVICE Description: STG Adhere to Safety Precautions With cues Assistance/Device. Outcome: Progressing   Problem: RH KNOWLEDGE DEFICIT Goal: RH STG INCREASE KNOWLEDGE OF DIABETES Description: Patient and family will be able to manage DM using educational resources for medications and dietary modification independently Outcome: Progressing Goal: RH STG INCREASE KNOWLEDGE OF HYPERTENSION Description: Patient and family will be able to manage HTN using educational resources for medications and dietary modification independently Outcome: Progressing Goal: RH STG INCREASE KNOWLEDGE OF DYSPHAGIA/FLUID INTAKE Description: Patient and family will be able to manage Dysphagia using educational resources for medications and dietary modification independently Outcome: Progressing Goal: RH STG INCREASE KNOWLEGDE OF HYPERLIPIDEMIA Description: Patient and family will be able to manage HLD using educational resources for medications and dietary modification independently Outcome: Progressing Goal: RH STG INCREASE KNOWLEDGE OF STROKE PROPHYLAXIS Description: Patient and family will be able to manage secondary risks using educational resources  for medications and dietary modification independently Outcome: Progressing   Problem: Education: Goal: Ability to describe self-care measures that may prevent or decrease complications (Diabetes Survival Skills Education) will improve Outcome: Progressing Goal: Individualized Educational Video(s) Outcome: Progressing   Problem: Coping: Goal: Ability to adjust to condition or change in health will improve Outcome: Progressing   Problem: Fluid Volume: Goal: Ability to maintain a balanced intake and output will improve Outcome: Progressing   Problem: Health Behavior/Discharge Planning: Goal: Ability to identify and utilize available resources and services will improve Outcome: Progressing Goal: Ability to manage health-related needs will improve Outcome: Progressing   Problem: Metabolic: Goal: Ability to maintain appropriate glucose levels will improve Outcome: Progressing   Problem: Nutritional: Goal: Maintenance of adequate nutrition will improve Outcome: Progressing Goal: Progress toward achieving an optimal weight will improve Outcome: Progressing   Problem: Skin Integrity: Goal: Risk for impaired skin integrity will decrease Outcome: Progressing   Problem: Tissue Perfusion: Goal: Adequacy of tissue perfusion will improve Outcome: Progressing

## 2024-05-25 NOTE — Plan of Care (Signed)
  Problem: RH BOWEL ELIMINATION Goal: RH STG MANAGE BOWEL WITH ASSISTANCE Description: STG Manage Bowel with mod I Assistance. Outcome: Progressing Goal: RH STG MANAGE BOWEL W/MEDICATION W/ASSISTANCE Description: STG Manage Bowel with Medication with mod I Assistance. Outcome: Progressing   Problem: RH SAFETY Goal: RH STG ADHERE TO SAFETY PRECAUTIONS W/ASSISTANCE/DEVICE Description: STG Adhere to Safety Precautions With cues Assistance/Device. Outcome: Progressing   Problem: RH KNOWLEDGE DEFICIT Goal: RH STG INCREASE KNOWLEDGE OF DYSPHAGIA/FLUID INTAKE Description: Patient and family will be able to manage Dysphagia using educational resources for medications and dietary modification independently Outcome: Progressing

## 2024-05-25 NOTE — Progress Notes (Signed)
 ABI exam is completed. Jerime Arif, RVT

## 2024-05-26 DIAGNOSIS — I69391 Dysphagia following cerebral infarction: Secondary | ICD-10-CM | POA: Diagnosis not present

## 2024-05-26 DIAGNOSIS — K5901 Slow transit constipation: Secondary | ICD-10-CM | POA: Diagnosis not present

## 2024-05-26 DIAGNOSIS — I63521 Cerebral infarction due to unspecified occlusion or stenosis of right anterior cerebral artery: Secondary | ICD-10-CM | POA: Diagnosis not present

## 2024-05-26 DIAGNOSIS — E139 Other specified diabetes mellitus without complications: Secondary | ICD-10-CM | POA: Diagnosis not present

## 2024-05-26 LAB — CBC
HCT: 43.4 % (ref 36.0–46.0)
Hemoglobin: 14 g/dL (ref 12.0–15.0)
MCH: 26.7 pg (ref 26.0–34.0)
MCHC: 32.3 g/dL (ref 30.0–36.0)
MCV: 82.8 fL (ref 80.0–100.0)
Platelets: 178 K/uL (ref 150–400)
RBC: 5.24 MIL/uL — ABNORMAL HIGH (ref 3.87–5.11)
RDW: 13.5 % (ref 11.5–15.5)
WBC: 8.8 K/uL (ref 4.0–10.5)
nRBC: 0 % (ref 0.0–0.2)

## 2024-05-26 LAB — GLUCOSE, CAPILLARY: Glucose-Capillary: 113 mg/dL — ABNORMAL HIGH (ref 70–99)

## 2024-05-26 MED ORDER — SORBITOL 70 % SOLN
60.0000 mL | Status: AC
  Administered 2024-05-26: 60 mL via ORAL
  Filled 2024-05-26: qty 60

## 2024-05-26 NOTE — Progress Notes (Signed)
 Occupational Therapy Session Note  Patient Details  Name: [name] MRN: [id number] Date of Birth: September 25, 1965  Today's Date: 05/26/2024 OT Individual Time: 8969-8884 OT Individual Time Calculation (min): 45 min    Short Term Goals: Week 1:  OT Short Term Goal 1 (Week 1): Pt will maintain static sit EOB with CGA to supervision to engage in UB self care safely. OT Short Term Goal 1 - Progress (Week 1): Met OT Short Term Goal 2 (Week 1): Pt will complete squat pivot transfers from bed >< w/c with mod A or less with 1 person to work towards a safe toilet transfers. OT Short Term Goal 2 - Progress (Week 1): Progressing toward goal OT Short Term Goal 3 (Week 1): Pt will demonstrate improved L arm awareness to attend to arm during bed mobility tasks with min cues. OT Short Term Goal 3 - Progress (Week 1): Met OT Short Term Goal 4 (Week 1): Pt will be able to don pull over shirt with mod A. OT Short Term Goal 4 - Progress (Week 1): Met Week 2:  OT Short Term Goal 1 (Week 2): Pt will complete squat pivot toilet transfers with mod A or less. OT Short Term Goal 1 - Progress (Week 2): Met OT Short Term Goal 2 (Week 2): Pt will be able to hold static stand with min A to enable caregiver to adjust clothing over hips. OT Short Term Goal 2 - Progress (Week 2): Met OT Short Term Goal 3 (Week 2): pt will be able to reach feet with long sponge to wash feet. OT Short Term Goal 3 - Progress (Week 2): Met OT Short Term Goal 4 (Week 2): pt will complete self ROM for LUE with min A. OT Short Term Goal 4 - Progress (Week 2): Met Week 3:  OT Short Term Goal 1 (Week 3): pt will complete UB Dressing with MIN A OT Short Term Goal 2 (Week 3): pt will complete 1/3 toileting tasks with MODA OT Short Term Goal 3 (Week 3): pt will complete one grooming task at sink MODI  Skilled Therapeutic Interventions/Progress Updates:    Pt received in w/c dressed and ready for the day. Asked pt if she would like to shower today  and pt said she was not feeling like she needed it today.  Pt taken to gym and was able to complete a stand pivot to her R side side with min to mod A.  Pt initially worked on standing balance with min/mod A on L side as she practiced reaching around to her back side to reach for cones to work on balance needed for LB dressing.  Pt did get fatigued easily.   Sitting on mat with L arm on tray table pt worked on guided movements of arm in various directions. Pt was able to elicit some shoulder retraction.   She has tightness in biceps with c/o discomfort on elbow extension. Gentle massage over biceps.    Pt initially did not want to do estim again but suggested she try it at a very low level of intensity and gradually increase to a level she felt she could tolerate.   Estim pads placed on forearm to activate finger extension.  Pt able to tolerate an intensity of 18 for 25 minutes.  Integrated functional task with hand over hand guiding with a cup grasping activity.   Pt transferred back to w/c to her R with min A.  Pt returned to room. Pillow under L  arm for support.  Belt alarm on and all needs met.     Therapy Documentation Precautions:  Precautions Precautions: Fall Recall of Precautions/Restrictions: Impaired Restrictions Weight Bearing Restrictions Per Provider Order: No    Vital Signs: Therapy Vitals Temp: 98.4 F (36.9 C) Pulse Rate: 66 Resp: 16 BP: 107/62 Patient Position (if appropriate): Lying Oxygen Therapy SpO2: 99 % O2 Device: Room Air Pain: Pain Assessment Pain Scale: 0-10 Pain Score: 3  Pain Location: Foot Pain Orientation: Left Pain Intervention(s): Repositioned ADL: ADL Eating: Supervision/safety Grooming: Setup Upper Body Bathing: Minimal assistance Lower Body Bathing: Moderate assistance Where Assessed-Lower Body Bathing: Shower Upper Body Dressing: Moderate assistance Where Assessed-Upper Body Dressing: Wheelchair Lower Body Dressing: Dependent Where  Assessed-Lower Body Dressing: Wheelchair Toileting: Dependent Where Assessed-Toileting: Teacher, adult education: Other (comment) (used stedy lift with +2)      Therapy/Group: Individual Therapy  Mahonri Seiden 05/26/2024, 8:46 AM

## 2024-05-26 NOTE — Progress Notes (Signed)
 Physical Therapy Session Note  Patient Details  Name: [name] MRN: [id number] Date of Birth: 1966/08/03  Today's Date: 05/26/2024 PT Individual Time: 0920-1002; 1405 - 1445 PT Individual Time Calculation (min): 42 min; 40 min   Short Term Goals: Week 2:  PT Short Term Goal 1 (Week 2): Pt will perform mod A squat pivot transfers bed <> chair PT Short Term Goal 2 (Week 2): During transfers, pt will maintain head/hip relationship with min verbal cues PT Short Term Goal 3 (Week 2): Pt will progress gait x 35' with mod A to advance LLE PT Short Term Goal 4 (Week 2): Pt will demonstrate dynamic standing balance without RUE support with mod A  SESSION 1 Skilled Therapeutic Interventions/Progress Updates: Patient supine asleep in bed on entrance to room. Patient alert and agreeable to PT session.   Patient reported no pain during session. Pt stated uncomfortable positioning in bed at night previously and PTA discussed to pt to reach out to nsg to reposition in bed if [facility] being elevated causes pt to slump down to awkward position.   Therapeutic Activity: Bed Mobility: Pt performed mass practice of supine<>sit to/from EOB with VC for bridging to the R (PTA stabilizing L LE to assist) and pt using R HOB railing to assist to sidelying while utilizing R UE to elevate trunk. Pt then cued to grasp R HOB railing in supinated forearm while leaning onto R side and to use R LE to hook behind L LE to assist with advancing B LE's onto bed. Pt with overall modA to perform bed mobility that progressed to minA on final bed mobility after NMRE performed. Pt demonstrated safe sitting balance EOB while PTA donned personal shoes and L AFO. Transfers: Pt performed sit<>stand transfer from EOB<WC (to the R) with overall modA. Provided VC for step placement and to reach to arm rest when closer to [facility]. Pt also required manual facilitation of R weight shift to increase L step clearance for retro step back to  [facility]  Neuromuscular Re-ed: - Muscle Energy Technique to promote stretch reflex and increased L hip abductors activation for improved strength and neuromuscular control. Pt performed multiple reps supine in bed with PTA holding L LE. Pt noted to compensate by extending R LE into bed to stabilize. PTA elevated R LE as well to decrease compensation in order to focus closer on L hip abductors with decreased activation noted, but still present - SLR L LE supine in bed 2 x 10 with pt able to raise heel off of bed less than 5. Pt then cued to control eccentric after PTA passively moved LE into SLR (available ROM) with pt demonstrating increase in difficulty in ability to activate musculare less than 25% of the time.  NMR performed for improvements in motor control and coordination, balance, sequencing, judgement, and self confidence/ efficacy in performing all aspects of mobility at highest level of independence.   Patient sitting in WC at end of session with brakes locked, belt alarm set, and all needs within reach.  SESSION 2 Skilled Therapeutic Interventions/Progress Updates: Patient sitting in [facility] with nsg assisting to toilet on entrance to room. Patient alert and agreeable to PT session.   Patient required modA to stand to STEDY from [facility] level due to increase in height of platform. Pt transported to toilet in room and provided with posterior/anterior pericare (nsg assisting) with maxA (pt wiping as able on legs as pt received laxatives prior and had BM in brief). Pt sitting  on toilet and required VC to lean to R per L lean, and occasional min/modA to avoid L LOB. Increased time required to fully clean pt. Pt maxA to stand to STEDY due to height of toilet. Pt maxA to donn personal pants/brief with VC for upright standing posture throughout. Pt transported back to WC in STEDY and washed hands at sink with VC to increase hygiene attention to L hand. Pt sitting in WC and performed LAQ/hip flexion on L LE with  ability to achieve 3-/5 knee extension, and 2+/5 hip flexion with son present and PTA updating on pt's CLOF. PTA also discussed upcoming WC evaluation and soon to be scheduled AFO consult.   Patient sitting in WC at end of session with brakes locked, son present, belt alarm set, and all needs within reach.       Therapy Documentation Precautions:  Precautions Precautions: Fall Recall of Precautions/Restrictions: Impaired Restrictions Weight Bearing Restrictions Per Provider Order: No  Therapy/Group: Individual Therapy  Willia Lampert PTA 05/26/2024, 1:03 PM

## 2024-05-26 NOTE — Progress Notes (Signed)
 PROGRESS NOTE   Subjective/Complaints: Pt up in bed. No complaints of pain today. Still working hard in therapy.   ROS: Limited due to cognitive/behavioral   Objective:   VAS US  ABI WITH/WO TBI Result Date: 05/25/2024  LOWER EXTREMITY DOPPLER STUDY Patient Name:  [name]  Date of Exam:   05/25/2024 Medical Rec #: [id number]      Accession #:    [id number] Date of Birth: 1966/03/12       Patient Gender: F Patient Age:   58 years Exam Location:  [facility] Procedure:      VAS US  ABI WITH/WO TBI Referring Phys: PRENTICE COMPTON --------------------------------------------------------------------------------  Indications: Claudication. High Risk Factors: Hypertension, hyperlipidemia, Diabetes, prior CVA.  Performing Technologist: Jimmye Scarce RVT  Examination Guidelines: A complete evaluation includes at minimum, Doppler waveform signals and systolic blood pressure reading at the level of bilateral brachial, anterior tibial, and posterior tibial arteries, when vessel segments are accessible. Bilateral testing is considered an integral part of a complete examination. Photoelectric Plethysmograph (PPG) waveforms and toe systolic pressure readings are included as required and additional duplex testing as needed. Limited examinations for reoccurring indications may be performed as noted.  ABI Findings: +---------+------------------+-----+-----------+--------+ Right    Rt Pressure (mmHg)IndexWaveform   Comment  +---------+------------------+-----+-----------+--------+ Brachial 100                    biphasic            +---------+------------------+-----+-----------+--------+ PTA      81                0.73 multiphasic         +---------+------------------+-----+-----------+--------+ DP       66                0.59 biphasic            +---------+------------------+-----+-----------+--------+ Great Toe52                 0.47 Abnormal            +---------+------------------+-----+-----------+--------+ +---------+------------------+-----+--------+-------+ Left     Lt Pressure (mmHg)IndexWaveformComment +---------+------------------+-----+--------+-------+ Brachial 111                    biphasic        +---------+------------------+-----+--------+-------+ PTA      82                0.74 biphasic        +---------+------------------+-----+--------+-------+ DP       91                0.82 biphasic        +---------+------------------+-----+--------+-------+ Great Toe65                0.59 Abnormal        +---------+------------------+-----+--------+-------+ +-------+-----------+-----------+------------+------------+ ABI/TBIToday's ABIToday's TBIPrevious ABIPrevious TBI +-------+-----------+-----------+------------+------------+ Right  0.73       0.47                                +-------+-----------+-----------+------------+------------+ Left   0.82  0.59                                +-------+-----------+-----------+------------+------------+  Summary: Right: Resting right ankle-brachial index indicates moderate right lower extremity arterial disease. The right toe-brachial index is abnormal.  Left: Resting left ankle-brachial index indicates mild left lower extremity arterial disease. The left toe-brachial index is abnormal.  *See table(s) above for measurements and observations.  Electronically signed by Lonni Gaskins MD on 05/25/2024 at 4:08:26 PM.    Final    Recent Labs    05/26/24 0428  WBC 8.8  HGB 14.0  HCT 43.4  PLT 178     Recent Labs    05/23/24 1812  NA 136  K 4.0  CL 105  CO2 19*  GLUCOSE 143*  BUN 16  CREATININE 1.38*  CALCIUM  9.7      Intake/Output Summary (Last 24 hours) at 05/26/2024 1041 Last data filed at 05/26/2024 0801 Gross per 24 hour  Intake 836 ml  Output --  Net 836 ml        Physical Exam: Vital Signs Blood  pressure 107/62, pulse 66, temperature 98.4 F (36.9 C), resp. rate 16, height 5' 3.5 (1.613 m), weight 88.6 kg, SpO2 99%.   Constitutional: No distress . Vital signs reviewed. HEENT: NCAT, EOMI, oral membranes moist Neck: supple Cardiovascular: RRR without murmur. No JVD    Respiratory/Chest: CTA Bilaterally without wheezes or rales. Normal effort    GI/Abdomen: BS +, non-tender, non-distended Ext: no clubbing, cyanosis, or edema Psych: pleasant and cooperative  Skin: No evidence of breakdown, no evidence of rash over exposed surfaces   Neuro:   alert today. . Right gaze preference ongoing but can be cued to left verbally. Follows basic commands.  speech sl dysarthric, left central VII. MMT:LUE 0/5. trace Left elbow flexion and Hip Ext LLE 2- with knee ext synergy pattern  . Moves right side freely. Senses pain and light touch in all 4's. LUE DTR's 1+. LLE DTR's brisk 3+ with early extensor tone, toes up. Increasing left pectoralis and elbow flexor tone MAS 2/3   + triple flexor Musculoskeletal: no pain with AROM/PROM  No swelling LLE, no hamstrings or adductor pain  RIgh  foot is warm, no skin lesions , normal sensation , no pain with toe foot or ankle ROM   Assessment/Plan: 1. Functional deficits which require 3+ hours per day of interdisciplinary therapy in a comprehensive inpatient rehab setting. Physiatrist is providing close team supervision and 24 hour management of active medical problems listed below. Physiatrist and rehab team continue to assess barriers to discharge/monitor patient progress toward functional and medical goals  Care Tool:  Bathing    Body parts bathed by patient: Abdomen, Front perineal area, Left arm, Face, Chest, Right upper leg, Left upper leg   Body parts bathed by helper: Right lower leg, Left lower leg, Right arm     Bathing assist Assist Level: Moderate Assistance - Patient 50 - 74%     Upper Body Dressing/Undressing Upper body dressing    What is the patient wearing?: Pull over shirt    Upper body assist Assist Level: Moderate Assistance - Patient 50 - 74%    Lower Body Dressing/Undressing Lower body dressing      What is the patient wearing?: Incontinence brief, Pants     Lower body assist Assist for lower body dressing: Total Assistance - Patient < 25%  Toileting Toileting    Toileting assist Assist for toileting: 2 Helpers     Transfers Chair/bed transfer  Transfers assist  Chair/bed transfer activity did not occur: Safety/medical concerns  Chair/bed transfer assist level: Dependent - mechanical lift (stedy)     Locomotion Ambulation   Ambulation assist   Ambulation activity did not occur: Safety/medical concerns  Assist level: 2 helpers (for w/c follow) Assistive device: Other (comment) (R Hand rail) Max distance: 19ft   Walk 10 feet activity   Assist  Walk 10 feet activity did not occur: Safety/medical concerns  Assist level: 2 helpers Assistive device: Other (comment) (R Hand rail)   Walk 50 feet activity   Assist Walk 50 feet with 2 turns activity did not occur: Safety/medical concerns         Walk 150 feet activity   Assist Walk 150 feet activity did not occur: Safety/medical concerns         Walk 10 feet on uneven surface  activity   Assist Walk 10 feet on uneven surfaces activity did not occur: Safety/medical concerns         Wheelchair     Assist Is the patient using a wheelchair?: Yes Type of Wheelchair: Manual    Wheelchair assist level: Dependent - Patient 0% Max wheelchair distance: 150    Wheelchair 50 feet with 2 turns activity    Assist        Assist Level: Dependent - Patient 0%   Wheelchair 150 feet activity     Assist      Assist Level: Dependent - Patient 0%   Blood pressure 107/62, pulse 66, temperature 98.4 F (36.9 C), resp. rate 16, height 5' 3.5 (1.613 m), weight 88.6 kg, SpO2 99%.  Medical Problem List  and Plan: 1. 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 Pt is s/p IR w TICI2b. 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  Antithrombotics: -DVT/anticoagulation:  Pharmaceutical: Xarelto  20mg  daily             -antiplatelet therapy: N/A 3. Pain Management: Tylenol  prn for pain              --Has been on topamax  for HA in the past.  -pain seems controlled for the most part. Would like to stay with tylenol  if possible given arousal 4. Mood/Behavior/Sleep: LCSW to follow for evaluaton and support.              --trazodone  prn for insomnia.              -antipsychotic agents: N/A 5. Neuropsych/cognition: This patient is capable of making decisions on her own behalf.  Cont amantadine  100mg  BID 9/4     6. Skin/Wound Care: Routine pressure relief measures.  7. Fluids/Electrolytes/Nutrition: Monitor I/O. Add CM restrictions to diet             -Post stroke Dysphagia --SLP following. D2 diet/thins            -encourage PO, protein supp for low albumin  8. A fib: Monitor HR TID-- Metoprolol  succinate 200 mg daily  --Continue Xarelto .  9. HTN: Monitor BP TID. On metoprolol  and aldactone  25mg  daily  -BP stable, monitor Vitals:   05/22/24 1915 05/23/24 0502 05/23/24 1345 05/23/24 1945  BP: 106/85 100/84 109/76 119/74   05/24/24 0543 05/24/24 1400 05/24/24 2011 05/25/24 0432  BP: 103/63 117/75 (!) 158/90 96/72   05/25/24 0812 05/25/24 1413 05/25/24 1952 05/26/24 0448  BP: 101/62 101/65 113/75 107/62     10. NICM w/HFrEF: Discussed with Cardiology Dr. Maceo. Digoxin  and metoprolol  tartrate was stooped. Entresto  not continued. She is continued on spironolactone  and Metoprolol  succinate 200mg  daily in the AM. Trying to keep regimen simple. Appreciate the follow up 11.   Pre-diabetes: had been On Farxiga . Added CM restrictions to diet.  --Monitor BS ac/hs and use SSI tor elevated BS.  Controlled 9/5 only daily checks -9/6-7/25 CBGs fine, could consider d/c checks if continued stability  -9/11 well controlled--dc cbg checks CBG (last 3)  Recent Labs    05/24/24 0556 05/25/24 0619 05/26/24 0834  GLUCAP 117* 110* 113*    12. HLD- LDL 109, crestor  40 mg daily  13. Obesity:  BMI 34.84 educate on diet and weight loss to promote overall health and mobility.  14. Leukocytosis: no s/s infection, resolved on abx 9/8    Latest Ref Rng & Units 05/26/2024    4:28 AM 05/23/2024    5:10 AM 05/19/2024    5:19 AM  CBC  WBC 4.0 - 10.5 K/uL 8.8  9.7  10.9   Hemoglobin 12.0 - 15.0 g/dL 85.9  86.0  86.2   Hematocrit 36.0 - 46.0 % 43.4  43.7  42.6   Platelets 150 - 400 K/uL 178  200  253      15. Slow transit constipation:   -miralax  scheduled daily with prns -05/22/24 LBM 2d ago per pt, but not documented since 9/4, monitor for now, if none by tomorrow would advance meds.   -still no bm 9/11---sorbitol  today 16. Cloudy malodorous urine per nursing 9/6, nursing also reported frequency/urge to urinate after emptying-- U/A shows WBC but no bact, UCx showing >100K e coli, sensitive to Keflex  500mg  BID , continue, hx PCN all but tolerated Keflex  in the past  17.  Intermittent Right foot pain , exam normal with exception of reduced pulses will check ABIs due to hx vascular occlusion, on Xarelto  no venous dopplers indicated at this time   -9/11 ABI's demonstrate moderate arterial disease on right and mild on the left. Observe for now. I don't see that right foot is impacting therapy   LOS: 17 days A FACE TO FACE EVALUATION WAS PERFORMED  Arthea ONEIDA Gunther 05/26/2024, 10:41 AM

## 2024-05-26 NOTE — Progress Notes (Signed)
 Speech Language Pathology Daily Session Note  Patient Details  Name: [name] MRN: [id number] Date of Birth: 1966-06-20  Today's Date: 05/26/2024 SLP Individual Time: 0731-0830 SLP Individual Time Calculation (min): 59 min  Short Term Goals: Week 3: SLP Short Term Goal 1 (Week 3): Patient will consume D3/thin with use of swallowing compensatory strategies given supervision multimodal A SLP Short Term Goal 2 (Week 3): Patient will demonstrate orientation to self, location, situation and time given supervision multimodal A SLP Short Term Goal 3 (Week 3): Patient will demonstrate attention to the L during functional tasks given mod multimodal A SLP Short Term Goal 4 (Week 3): Patient will recall activities completed in prior therapy sessions given min multimodal A  Skilled Therapeutic Interventions: SLP conducted skilled therapy session targeting cognitive and swallowing goals. Upon SLP entry, NT facilitating patient repositioning for breakfast setup. SLP assisted with continued repositioning and then conducted assessment of diet tolerance with Dys3/thin liquids. Patient demonstrates prolonged mastication across solid textures and utilizes liquid was to clear residuals with supervision assist. She tolerated all items with singular instance of immediate cough after sip of milk but no other overt s/sx of penetration/aspiration. Recommend continuation of current diet with medications whole in thin liquids. SLP then facilitated various tasks targeting L attention; patient benefited from mod to max cues to identify therapy times on left side of daily schedule. She the completed number sequencing task requiring attention to both the R and L of each page, benefiting from max assist to locate numbers on the L but min assist for task overall (located numbers on the R independently). Patient was left in room with call bell in reach and alarm set. SLP will continue to target goals per plan of care.         Pain Pain Assessment Pain Scale: 0-10 Pain Score: 3  Pain Location: Foot Pain Orientation: Left Pain Intervention(s): Repositioned  Therapy/Group: Individual Therapy  [name], M.A., CCC-SLP  [name] A [name] 05/26/2024, 8:48 AM

## 2024-05-26 NOTE — Plan of Care (Signed)
  Problem: Consults Goal: RH STROKE PATIENT EDUCATION Description: See Patient Education module for education specifics  Outcome: Progressing Goal: Nutrition Consult-if indicated Outcome: Progressing Goal: Diabetes Guidelines if Diabetic/Glucose > 140 Description: If diabetic or lab glucose is > 140 mg/dl - Initiate Diabetes/Hyperglycemia Guidelines & Document Interventions  Outcome: Progressing   Problem: RH BOWEL ELIMINATION Goal: RH STG MANAGE BOWEL WITH ASSISTANCE Description: STG Manage Bowel with mod I Assistance. Outcome: Progressing Goal: RH STG MANAGE BOWEL W/MEDICATION W/ASSISTANCE Description: STG Manage Bowel with Medication with mod I Assistance. Outcome: Progressing   Problem: RH SAFETY Goal: RH STG ADHERE TO SAFETY PRECAUTIONS W/ASSISTANCE/DEVICE Description: STG Adhere to Safety Precautions With cues Assistance/Device. Outcome: Progressing   Problem: RH KNOWLEDGE DEFICIT Goal: RH STG INCREASE KNOWLEDGE OF DIABETES Description: Patient and family will be able to manage DM using educational resources for medications and dietary modification independently Outcome: Progressing Goal: RH STG INCREASE KNOWLEDGE OF HYPERTENSION Description: Patient and family will be able to manage HTN using educational resources for medications and dietary modification independently Outcome: Progressing Goal: RH STG INCREASE KNOWLEDGE OF DYSPHAGIA/FLUID INTAKE Description: Patient and family will be able to manage Dysphagia using educational resources for medications and dietary modification independently Outcome: Progressing Goal: RH STG INCREASE KNOWLEGDE OF HYPERLIPIDEMIA Description: Patient and family will be able to manage HLD using educational resources for medications and dietary modification independently Outcome: Progressing Goal: RH STG INCREASE KNOWLEDGE OF STROKE PROPHYLAXIS Description: Patient and family will be able to manage secondary risks using educational resources  for medications and dietary modification independently Outcome: Progressing   Problem: Education: Goal: Ability to describe self-care measures that may prevent or decrease complications (Diabetes Survival Skills Education) will improve Outcome: Progressing Goal: Individualized Educational Video(s) Outcome: Progressing   Problem: Coping: Goal: Ability to adjust to condition or change in health will improve Outcome: Progressing   Problem: Fluid Volume: Goal: Ability to maintain a balanced intake and output will improve Outcome: Progressing   Problem: Health Behavior/Discharge Planning: Goal: Ability to identify and utilize available resources and services will improve Outcome: Progressing Goal: Ability to manage health-related needs will improve Outcome: Progressing   Problem: Metabolic: Goal: Ability to maintain appropriate glucose levels will improve Outcome: Progressing   Problem: Nutritional: Goal: Maintenance of adequate nutrition will improve Outcome: Progressing Goal: Progress toward achieving an optimal weight will improve Outcome: Progressing   Problem: Skin Integrity: Goal: Risk for impaired skin integrity will decrease Outcome: Progressing   Problem: Tissue Perfusion: Goal: Adequacy of tissue perfusion will improve Outcome: Progressing

## 2024-05-27 DIAGNOSIS — K5901 Slow transit constipation: Secondary | ICD-10-CM | POA: Diagnosis not present

## 2024-05-27 DIAGNOSIS — I63521 Cerebral infarction due to unspecified occlusion or stenosis of right anterior cerebral artery: Secondary | ICD-10-CM | POA: Diagnosis not present

## 2024-05-27 DIAGNOSIS — E139 Other specified diabetes mellitus without complications: Secondary | ICD-10-CM | POA: Diagnosis not present

## 2024-05-27 DIAGNOSIS — I69391 Dysphagia following cerebral infarction: Secondary | ICD-10-CM | POA: Diagnosis not present

## 2024-05-27 MED ORDER — SENNOSIDES-DOCUSATE SODIUM 8.6-50 MG PO TABS
1.0000 | ORAL_TABLET | Freq: Every day | ORAL | Status: DC
  Administered 2024-05-27 – 2024-06-04 (×7): 1 via ORAL
  Filled 2024-05-27 (×9): qty 1

## 2024-05-27 NOTE — Progress Notes (Signed)
 Physical Therapy Session Note  Patient Details  Name: [name] MRN: [id number] Date of Birth: 1965-10-18  Today's Date: 05/27/2024 PT Individual Time: 0806-0901; 1133 - 1200 PT Individual Time Calculation (min): 55 min27 min  Short Term Goals: Week 2:  PT Short Term Goal 1 (Week 2): Pt will perform mod A squat pivot transfers bed <> chair PT Short Term Goal 2 (Week 2): During transfers, pt will maintain head/hip relationship with min verbal cues PT Short Term Goal 3 (Week 2): Pt will progress gait x 35' with mod A to advance LLE PT Short Term Goal 4 (Week 2): Pt will demonstrate dynamic standing balance without RUE support with mod A  SESSION 1 Skilled Therapeutic Interventions/Progress Updates: Patient semi-reclined in bed receiving medication from nsg on entrance to room. Patient alert and agreeable to PT session.   Patient reported no pain during session  Therapeutic Activity: Bed Mobility: Pt performed supine<sit on EOB with min/light modA (PTA flexing L knee to assist with bridge over to R) with pt using R HOB railing and added cuing to use R UE to assist with truncal elevation. Pt advanced B LE's off of bed with tactile cuing and minA (bed flat and use of R HOB railing). Pt sat EOB with supervision while PTA donned personal shoes and L AFO. Transfers: Pt performed sit<>stand pivot transfer from EOB<WC with paretic LE leading (shoe cap donned). Pt minA to stand from low bed height and cues for push off (manual placement of L LE required). Pt required manual facilitation of weight shift accordingly, as well multimodal cues to step towards far wheel of WC. Max cuing required to pivot R LE to neutral alignment vs externally rotated throughout. Pt with decreased L hip/knee flexion but able to slide LE towards WC.  - Pt maxA to donn personal pants with VC to bridge in order to roll to R/L with PTA stabilizing L LE in knee flexion.  Neuromuscular Re-ed: - Pt performed mass practice of  SLR to start with pt able to do so without assistance (relatively close to 6 on few reps), with modA to achieve higher ROM. Pt then cued to perform SLR + adduction/abduction (less than 3 off of bed) with PTA providing minA to take away gravity.  - Pt cued to perform B bridge supine in bed with R LE over activating VS L (PTA stabilizing B LE's. Pt then cued to lay R LE in extension on bed and to focus on L bridge to R sidelying. Pt with increase in challenge to activate required musculature. Pt then cued to perform L hip extension following PTA passively moving LE into L hip flexion with min resistance provided (pt cued to increase neuromuscular connection to glute/knee flexors with noted improvement in strength). Pt the cued to perform L bridge with brief ability to activate in position, but difficult to maintain more than 2 reps.  - Pt standing + 2 HHA mod/maxA with cues to advance L LE slightly forward and retro. Pt with noted L knee flexor tone and increased R LE in extension that made it difficult to advance L LE functionally. Pt then cued to lean to L onto PTA with PTA providing L knee extension assistance to promote proprioceptive feedback, pt then cued to perform mini squats with cues to maintain B knee flexed position (pt difficult to hold longer than few seconds as R LE biased towards knee extension and push to L)  NMR performed for improvements in motor control and  coordination, balance, sequencing, judgement, and self confidence/ efficacy in performing all aspects of mobility at highest level of independence.   Patient sitting in WC at end of session with brakes locked, belt alarm set, and all needs within reach.  SESSION 2 Skilled Therapeutic Interventions/Progress Updates: Patient sitting in WC on entrance to room. Patient alert and agreeable to PT session.   Patient reported no pain during session, only fatigue  Therapeutic Activity: Transfers: Pt performed sit<>stand transfer from  WC<>edge of mat with overall modA and multimodal cues to pivot leading (R) LE towards far side of WC castor wheel. Manual facilitation of weight shift required with pt demonstrating 2 increased length retro steps with L LE.   Neuromuscular Re-ed: - Pt attempted to step to 1.5 step with L LE and modA + 2 (tech on R with HHA). Pt presented with increase in R LE extensor push to L. Pt transported to hallway and performed same task with RHR and decreased extensor push to L, but extensor tone present in  L LE that made it difficult to advance onto step. Pt then cued to advance R LE onto step with pt requiring increased time to advance LE, but was able to perform x 6 with PTA on R providing CGA knee block for safety.  NMR performed for improvements in motor control and coordination, balance, sequencing, judgement, and self confidence/ efficacy in performing all aspects of mobility at highest level of independence.   Patient sitting in WC at end of session with brakes locked, belt alarm set, and all needs within reach.       Therapy Documentation Precautions:  Precautions Precautions: Fall Recall of Precautions/Restrictions: Impaired Restrictions Weight Bearing Restrictions Per Provider Order: No  Therapy/Group: Individual Therapy  Matthe Sloane PTA 05/27/2024, 12:26 PM

## 2024-05-27 NOTE — Progress Notes (Signed)
 PROGRESS NOTE   Subjective/Complaints: No new issues today. About to work on transfers with PT when I arrived. Sleeping well. Appetite is reasonable  ROS: Patient denies fever, rash, sore throat, blurred vision, dizziness, nausea, vomiting, diarrhea, cough, shortness of breath or chest pain, joint or back/neck pain, headache, or mood change.   Objective:   VAS US  ABI WITH/WO TBI Result Date: 05/25/2024  LOWER EXTREMITY DOPPLER STUDY Patient Name:  [name]  Date of Exam:   05/25/2024 Medical Rec #: [id number]      Accession #:    [id number] Date of Birth: 1966/02/06       Patient Gender: F Patient Age:   58 years Exam Location:  [facility] Procedure:      VAS US  ABI WITH/WO TBI Referring Phys: PRENTICE COMPTON --------------------------------------------------------------------------------  Indications: Claudication. High Risk Factors: Hypertension, hyperlipidemia, Diabetes, prior CVA.  Performing Technologist: Jimmye Scarce RVT  Examination Guidelines: A complete evaluation includes at minimum, Doppler waveform signals and systolic blood pressure reading at the level of bilateral brachial, anterior tibial, and posterior tibial arteries, when vessel segments are accessible. Bilateral testing is considered an integral part of a complete examination. Photoelectric Plethysmograph (PPG) waveforms and toe systolic pressure readings are included as required and additional duplex testing as needed. Limited examinations for reoccurring indications may be performed as noted.  ABI Findings: +---------+------------------+-----+-----------+--------+ Right    Rt Pressure (mmHg)IndexWaveform   Comment  +---------+------------------+-----+-----------+--------+ Brachial 100                    biphasic            +---------+------------------+-----+-----------+--------+ PTA      81                0.73 multiphasic          +---------+------------------+-----+-----------+--------+ DP       66                0.59 biphasic            +---------+------------------+-----+-----------+--------+ Great Toe52                0.47 Abnormal            +---------+------------------+-----+-----------+--------+ +---------+------------------+-----+--------+-------+ Left     Lt Pressure (mmHg)IndexWaveformComment +---------+------------------+-----+--------+-------+ Brachial 111                    biphasic        +---------+------------------+-----+--------+-------+ PTA      82                0.74 biphasic        +---------+------------------+-----+--------+-------+ DP       91                0.82 biphasic        +---------+------------------+-----+--------+-------+ Great Toe65                0.59 Abnormal        +---------+------------------+-----+--------+-------+ +-------+-----------+-----------+------------+------------+ ABI/TBIToday's ABIToday's TBIPrevious ABIPrevious TBI +-------+-----------+-----------+------------+------------+ Right  0.73       0.47                                +-------+-----------+-----------+------------+------------+  Left   0.82       0.59                                +-------+-----------+-----------+------------+------------+  Summary: Right: Resting right ankle-brachial index indicates moderate right lower extremity arterial disease. The right toe-brachial index is abnormal.  Left: Resting left ankle-brachial index indicates mild left lower extremity arterial disease. The left toe-brachial index is abnormal.  *See table(s) above for measurements and observations.  Electronically signed by Lonni Gaskins MD on 05/25/2024 at 4:08:26 PM.    Final    Recent Labs    05/26/24 0428  WBC 8.8  HGB 14.0  HCT 43.4  PLT 178     No results for input(s): NA, K, CL, CO2, GLUCOSE, BUN, CREATININE, CALCIUM  in the last 72  hours.     Intake/Output Summary (Last 24 hours) at 05/27/2024 1201 Last data filed at 05/27/2024 0831 Gross per 24 hour  Intake 594 ml  Output --  Net 594 ml        Physical Exam: Vital Signs Blood pressure 112/65, pulse 77, temperature 98.5 F (36.9 C), temperature source Oral, resp. rate 18, height 5' 3.5 (1.613 m), weight 88.6 kg, SpO2 100%.   Constitutional: No distress . Vital signs reviewed. HEENT: NCAT, EOMI, oral membranes moist Neck: supple Cardiovascular: RRR without murmur. No JVD    Respiratory/Chest: CTA Bilaterally without wheezes or rales. Normal effort    GI/Abdomen: BS +, non-tender, non-distended Ext: no clubbing, cyanosis, or edema Psych: flat but generally pleasant and cooperative   Skin: No evidence of breakdown, no evidence of rash over exposed surfaces   Neuro:   alert today.  Right gaze preference ongoing but can be cued to left verbally. Follows basic commands.  speech sl dysarthric, left central VII. MMT:LUE 0/5. trace Left elbow flexion and Hip Ext LLE 2- with knee ext synergy pattern  . Moves right side freely. Senses pain and light touch in all 4's. LUE DTR's 1+. LLE DTR's brisk 3+ with early extensor tone, toes up.  left pectoralis and elbow flexor tone MAS 1-2     Musculoskeletal: no pain with AROM/PROM  No swelling LLE, no hamstrings or adductor pain. No pain with ROM  Assessment/Plan: 1. Functional deficits which require 3+ hours per day of interdisciplinary therapy in a comprehensive inpatient rehab setting. Physiatrist is providing close team supervision and 24 hour management of active medical problems listed below. Physiatrist and rehab team continue to assess barriers to discharge/monitor patient progress toward functional and medical goals  Care Tool:  Bathing    Body parts bathed by patient: Abdomen, Front perineal area, Left arm, Face, Chest, Right upper leg, Left upper leg   Body parts bathed by helper: Right lower leg, Left  lower leg, Right arm     Bathing assist Assist Level: Moderate Assistance - Patient 50 - 74%     Upper Body Dressing/Undressing Upper body dressing   What is the patient wearing?: Pull over shirt    Upper body assist Assist Level: Moderate Assistance - Patient 50 - 74%    Lower Body Dressing/Undressing Lower body dressing      What is the patient wearing?: Incontinence brief, Pants     Lower body assist Assist for lower body dressing: Total Assistance - Patient < 25%     Toileting Toileting    Toileting assist Assist for toileting: 2 Helpers     Transfers  Chair/bed transfer  Transfers assist  Chair/bed transfer activity did not occur: Safety/medical concerns  Chair/bed transfer assist level: Dependent - mechanical lift (stedy)     Locomotion Ambulation   Ambulation assist   Ambulation activity did not occur: Safety/medical concerns  Assist level: 2 helpers (for w/c follow) Assistive device: Other (comment) (R Hand rail) Max distance: 59ft   Walk 10 feet activity   Assist  Walk 10 feet activity did not occur: Safety/medical concerns  Assist level: 2 helpers Assistive device: Other (comment) (R Hand rail)   Walk 50 feet activity   Assist Walk 50 feet with 2 turns activity did not occur: Safety/medical concerns         Walk 150 feet activity   Assist Walk 150 feet activity did not occur: Safety/medical concerns         Walk 10 feet on uneven surface  activity   Assist Walk 10 feet on uneven surfaces activity did not occur: Safety/medical concerns         Wheelchair     Assist Is the patient using a wheelchair?: Yes Type of Wheelchair: Manual    Wheelchair assist level: Dependent - Patient 0% Max wheelchair distance: 150    Wheelchair 50 feet with 2 turns activity    Assist        Assist Level: Dependent - Patient 0%   Wheelchair 150 feet activity     Assist      Assist Level: Dependent - Patient 0%    Blood pressure 112/65, pulse 77, temperature 98.5 F (36.9 C), temperature source Oral, resp. rate 18, height 5' 3.5 (1.613 m), weight 88.6 kg, SpO2 100%.  Medical Problem List and Plan: 1. 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 Pt is s/p IR w TICI2b. 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  Antithrombotics: -DVT/anticoagulation:  Pharmaceutical: Xarelto  20mg  daily             -antiplatelet therapy: N/A 3. Pain Management: Tylenol  prn for pain              --Has been on topamax  for HA in the past.  9/12 pain appears controlled 4. Mood/Behavior/Sleep: LCSW to follow for evaluaton and support.              --trazodone  prn for insomnia.              -antipsychotic agents: N/A 5. Neuropsych/cognition: This patient is capable of making decisions on her own behalf.  Cont amantadine  100mg  BID 9/4     6. Skin/Wound Care: Routine pressure relief measures.  7. Fluids/Electrolytes/Nutrition: Monitor I/O. Add CM restrictions to diet             -Post stroke Dysphagia --SLP following. D2 diet/thins            -encourage PO, protein supp for low albumin  8. A fib: Monitor HR TID-- Metoprolol  succinate 200 mg daily  --Continue Xarelto .  9. HTN: Monitor BP TID. On metoprolol  and aldactone  25mg  daily  -BP stable, monitor Vitals:   05/24/24 0543 05/24/24 1400 05/24/24 2011 05/25/24 0432  BP: 103/63 117/75 ROLLEN)  158/90 96/72   05/25/24 0812 05/25/24 1413 05/25/24 1952 05/26/24 0448  BP: 101/62 101/65 113/75 107/62   05/26/24 1357 05/26/24 1454 05/26/24 2017 05/27/24 0505  BP: 117/85 119/81 118/85 112/65     10. NICM w/HFrEF: Discussed with Cardiology Dr. Maceo. Digoxin  and metoprolol  tartrate was stooped. Entresto  not continued. She is continued on spironolactone  and  Metoprolol  succinate 200mg  daily in the AM. Trying to keep regimen simple. Appreciate the follow up 11.  Pre-diabetes: had been On Farxiga . Added CM restrictions to diet.  --Monitor BS ac/hs and use SSI tor elevated BS.  Controlled 9/5 only daily checks -9/6-7/25 CBGs fine, could consider d/c checks if continued stability  -9/11 well controlled--dc'd cbg checks CBG (last 3)  Recent Labs    05/25/24 0619 05/26/24 0834  GLUCAP 110* 113*    12. HLD- LDL 109, crestor  40 mg daily  13. Obesity:  BMI 34.84 educate on diet and weight loss to promote overall health and mobility.  14. Leukocytosis: no s/s infection, resolved on abx 9/8    Latest Ref Rng & Units 05/26/2024    4:28 AM 05/23/2024    5:10 AM 05/19/2024    5:19 AM  CBC  WBC 4.0 - 10.5 K/uL 8.8  9.7  10.9   Hemoglobin 12.0 - 15.0 g/dL 85.9  86.0  86.2   Hematocrit 36.0 - 46.0 % 43.4  43.7  42.6   Platelets 150 - 400 K/uL 178  200  253      15. Slow transit constipation:   -miralax  scheduled daily with prns (refused 9/12) -had bm 9/11 after sorbitol   -add one senna-s at bedtime 16. Cloudy malodorous urine per nursing 9/6, nursing also reported frequency/urge to urinate after emptying-- U/A shows WBC but no bact, UCx showing >100K e coli, sensitive to Keflex  500mg  BID , continue, hx PCN all but tolerated Keflex  in the past  17.  Intermittent Right foot pain , exam normal with exception of reduced pulses will check ABIs due to hx vascular occlusion, on Xarelto  no venous dopplers indicated at this time   -9/11-12 ABI's demonstrate moderate arterial disease on right and mild on the left. Observe for now. I don't see that right foot is impacting therapy right now. Foot is warm to touch   LOS: 18 days A FACE TO FACE EVALUATION WAS PERFORMED  Arthea ONEIDA Gunther 05/27/2024, 12:01 PM

## 2024-05-27 NOTE — Progress Notes (Signed)
 Physical Therapy Weekly Progress Note  Patient Details  Name: [name] MRN: [id number] Date of Birth: 01-04-1966  Beginning of progress report period: May 21, 2024 End of progress report period: May 27, 2024  Patient has met 2 of 2 short term goals. Pt is making progress towards LTG's. Pt progressing towards transfer goals by demonstrating modA for stand pivot transfer (when cued to perform squat pivot, pt would perform stand vs squatting and pivoting so transfers progressed to stand pivots). Pt currently has improved gait LOA with RHR and advancing L LE through swing (no buckling noted but L LE maintained in slight flexion at knee) with minimal hip flexion and hip compensation to assist (pt still requires lateral weight shift facilitation). Pt is currently limited by L extensor tone and still presents with push to L with R LE increasing in knee extension (this fluctuates but has improved since evaluation). Pt set to have WC evaluation next week, as well as AFO consult.  Patient continues to demonstrate the following deficits {impairments:3041632} and therefore will continue to benefit from skilled PT intervention to increase functional independence with mobility.  Patient {LTG progression:3041653}.  {plan of rjmz:6958345}  PT Short Term Goals Week 2:  PT Short Term Goal 1 (Week 2): Pt will perform mod A squat pivot transfers bed <> chair PT Short Term Goal 1 - Progress (Week 2): Progressing toward goal PT Short Term Goal 2 (Week 2): During transfers, pt will maintain head/hip relationship with min verbal cues PT Short Term Goal 2 - Progress (Week 2): Progressing toward goal PT Short Term Goal 3 (Week 2): Pt will progress gait x 35' with mod A to advance LLE PT Short Term Goal 3 - Progress (Week 2): Met PT Short Term Goal 4 (Week 2): Pt will demonstrate dynamic standing balance without RUE support with mod A PT Short Term Goal 4 - Progress (Week 2): Met  Skilled Therapeutic  Interventions/Progress Updates:      Therapy Documentation Precautions:  Precautions Precautions: Fall Recall of Precautions/Restrictions: Impaired Restrictions Weight Bearing Restrictions Per Provider Order: No  Sharyah Bostwick PTA 05/27/2024, 4:14 PM

## 2024-05-27 NOTE — Progress Notes (Signed)
 Occupational Therapy Session Note  Patient Details  Name: STESHA NEYENS MRN: [id number] Date of Birth: 1966-03-18  Today's Date: 05/27/2024 OT Individual Time: 9054-8954 OT Individual Time Calculation (min): 60 min    Short Term Goals: Week 1:  OT Short Term Goal 1 (Week 1): Pt will maintain static sit EOB with CGA to supervision to engage in UB self care safely. OT Short Term Goal 1 - Progress (Week 1): Met OT Short Term Goal 2 (Week 1): Pt will complete squat pivot transfers from bed >< w/c with mod A or less with 1 person to work towards a safe toilet transfers. OT Short Term Goal 2 - Progress (Week 1): Progressing toward goal OT Short Term Goal 3 (Week 1): Pt will demonstrate improved L arm awareness to attend to arm during bed mobility tasks with min cues. OT Short Term Goal 3 - Progress (Week 1): Met OT Short Term Goal 4 (Week 1): Pt will be able to don pull over shirt with mod A. OT Short Term Goal 4 - Progress (Week 1): Met Week 2:  OT Short Term Goal 1 (Week 2): Pt will complete squat pivot toilet transfers with mod A or less. OT Short Term Goal 1 - Progress (Week 2): Met OT Short Term Goal 2 (Week 2): Pt will be able to hold static stand with min A to enable caregiver to adjust clothing over hips. OT Short Term Goal 2 - Progress (Week 2): Met OT Short Term Goal 3 (Week 2): pt will be able to reach feet with long sponge to wash feet. OT Short Term Goal 3 - Progress (Week 2): Met OT Short Term Goal 4 (Week 2): pt will complete self ROM for LUE with min A. OT Short Term Goal 4 - Progress (Week 2): Met Week 3:  OT Short Term Goal 1 (Week 3): pt will complete UB Dressing with MIN A OT Short Term Goal 2 (Week 3): pt will complete 1/3 toileting tasks with MODA OT Short Term Goal 3 (Week 3): pt will complete one grooming task at sink MODI  Skilled Therapeutic Interventions/Progress Updates:    Pt received in wc and agreeable to a shower.  See ADL documentation below.  Pt used squat  pivots to get in and out of shower with mod A and max cues, stand pivots to toilet 2x. Once to use the toilet with +2 A from rehab tech and second time to demonstrate to her nurse techs how she does the transfer.  Nurse techs can do a stand pivot with her as long as there is two people. Discussed her L inattention and L hemiparesis and cautions to take with transfers.   Pt completed shower and dressing. Resting in wc at end of session with all needs met.   Therapy Documentation Precautions:  Precautions Precautions: Fall Recall of Precautions/Restrictions: Impaired Restrictions Weight Bearing Restrictions Per Provider Order: No   Pain: Pain Assessment Pain Score: 0-No pain ADL: ADL Eating: Supervision/safety Grooming: Setup Upper Body Bathing: Supervision/safety Where Assessed-Upper Body Bathing: Shower Lower Body Bathing: Minimal assistance Where Assessed-Lower Body Bathing: Shower Upper Body Dressing: Moderate assistance Where Assessed-Upper Body Dressing: Wheelchair Lower Body Dressing: Maximal assistance Where Assessed-Lower Body Dressing: Wheelchair Toileting: Maximal assistance Where Assessed-Toileting: Teacher, adult education: Moderate assistance Toilet Transfer Method: Stand pivot Toilet Transfer Equipment: Acupuncturist: Moderate assistance Film/video editor Method: Warden/ranger: Grab bars, Transfer tub bench     Therapy/Group: Individual Therapy  Ryker Pherigo  05/27/2024, 12:49 PM

## 2024-05-27 NOTE — Progress Notes (Signed)
 Speech Language Pathology Daily Session Note  Patient Details  Name: [name] MRN: [id number] Date of Birth: 02/09/1966  Today's Date: 05/27/2024 SLP Individual Time: 8697-8655 SLP Individual Time Calculation (min): 42 min  Short Term Goals: Week 3: SLP Short Term Goal 1 (Week 3): Patient will consume D3/thin with use of swallowing compensatory strategies given supervision multimodal A SLP Short Term Goal 2 (Week 3): Patient will demonstrate orientation to self, location, situation and time given supervision multimodal A SLP Short Term Goal 3 (Week 3): Patient will demonstrate attention to the L during functional tasks given mod multimodal A SLP Short Term Goal 4 (Week 3): Patient will recall activities completed in prior therapy sessions given min multimodal A  Skilled Therapeutic Interventions:  Pt seen for skilled SLP session to address cognitive goals including problem solving, working memory, and attention/visual scanning to L side. SLP facilitated novel card game involving sequencing from 1-10 and scanning across field to put cards in correct spaces. Pt required moderate verbal and visual cues in addition to extra processing time to complete task accurately. Pt left sitting up in w/c with chair alarm activated and daughter at bedside. Continue SLP PoC.   Pain Pain Assessment Pain Scale: 0-10 Pain Score: 0-No pain  Therapy/Group: Individual Therapy  Peyton JINNY Rummer 05/27/2024, 3:43 PM

## 2024-05-28 ENCOUNTER — Encounter (HOSPITAL_COMMUNITY): Payer: Self-pay | Admitting: Physical Medicine & Rehabilitation

## 2024-05-28 DIAGNOSIS — I63521 Cerebral infarction due to unspecified occlusion or stenosis of right anterior cerebral artery: Secondary | ICD-10-CM | POA: Diagnosis not present

## 2024-05-28 DIAGNOSIS — I10 Essential (primary) hypertension: Secondary | ICD-10-CM | POA: Diagnosis not present

## 2024-05-28 DIAGNOSIS — K5901 Slow transit constipation: Secondary | ICD-10-CM | POA: Diagnosis not present

## 2024-05-28 NOTE — Progress Notes (Signed)
 Speech Language Pathology Daily Session Note  Patient Details  Name: [name] MRN: [id number] Date of Birth: Jan 14, 1966  Today's Date: 05/28/2024 SLP Individual Time: 1116-1200 SLP Individual Time Calculation (min): 44 min  Short Term Goals: Week 3: SLP Short Term Goal 1 (Week 3): Patient will consume D3/thin with use of swallowing compensatory strategies given supervision multimodal A SLP Short Term Goal 2 (Week 3): Patient will demonstrate orientation to self, location, situation and time given supervision multimodal A SLP Short Term Goal 3 (Week 3): Patient will demonstrate attention to the L during functional tasks given mod multimodal A SLP Short Term Goal 4 (Week 3): Patient will recall activities completed in prior therapy sessions given min multimodal A  Skilled Therapeutic Interventions: Pt seen for skilled ST with focus on cognitive communication goals, pt in wheelchair and agreeable to therapeutic tasks. Pt demonstrates ability to recall PT session from this morning as well as previous structured tasks in ST sessions. SLP facilitating orientation task by providing supervision A cues for use of external aids with 100% task accuracy. Pt able to use anchor of birthday earlier this week to increase weekly orientation concepts. Pt participating in L scanning/attention task via various community maps benefiting from overall mod A cues to fully scan L via lighthouse strategy, ~60% accuracy. Pt reports she will ask her daughter to bring glasses in to aid in visual tasks. Pt left in wheelchair with belt alarm on and call button within reach. Cont ST POC.   Pain Pain Assessment Pain Scale: 0-10 Pain Score: 0-No pain  Therapy/Group: Individual Therapy  [name] 05/28/2024, 11:47 AM

## 2024-05-28 NOTE — Progress Notes (Signed)
 Physical Therapy Session Note  Patient Details  Name: [name] MRN: [id number] Date of Birth: 03-14-1966  Today's Date: 05/28/2024 PT Individual Time: 0921-1033 PT Individual Time Calculation (min): 72 min   Short Term Goals: Week 2:  PT Short Term Goal 1 (Week 2): Pt will perform mod A squat pivot transfers bed <> chair PT Short Term Goal 1 - Progress (Week 2): Progressing toward goal PT Short Term Goal 2 (Week 2): During transfers, pt will maintain head/hip relationship with min verbal cues PT Short Term Goal 2 - Progress (Week 2): Progressing toward goal PT Short Term Goal 3 (Week 2): Pt will progress gait x 35' with mod A to advance LLE PT Short Term Goal 3 - Progress (Week 2): Met PT Short Term Goal 4 (Week 2): Pt will demonstrate dynamic standing balance without RUE support with mod A PT Short Term Goal 4 - Progress (Week 2): Met  Skilled Therapeutic Interventions/Progress Updates: Patient semi-reclined in bed on entrance to room. Patient alert and agreeable to PT session.   Patient reported no pain during session.   Therapeutic Activity: Bed Mobility: Pt performed supine<sit on EOB with increased time at first to problem solve how sequence starts with pt attempting to sit straight up from supine vs sidelying at first, then having trouble scooting self over in bed but was able to via [facility] railing. Pt then required modA that first attempt due to poor positioning. Pt cued to return to bed with minA and cue to hook L LE with R LE. Pt then cued to use R HOB railing and to roll to L sidelying, and to advance B LE's off of bed (light minA with pt also cued to active hip flexors on L with R LE assisting). Pt then cued to use R UE to assist with truncal elevation and overall minA and improved sequence.. Transfers: Pt performed sit<>stand pivot transfer to the R with modA and cues for weight shift/safe placement of steps. Pt performed sit<>stand in preparation for interventions below with  minA and cues for hand placement (maxA to place L LE in stand ready position).  Neuromuscular Re-ed: - Pt in RW with L hemi-grip and attempted to take few steps with close WC follow and overall maxA. Pt presented with decrease in weight shift bilaterally. Pt unable to advance in RW due to decreased weight shift and increase in push to L. Pt participated in lateral weight shifting in RW to provide necessary feedback for gait carryover. Pt provided with tactile feedback at first, then progressed to self correcting when noticing weight shift increases to the L. Pt cued to lean to R towards rehab techs shoulder with pt doing so on few occasions after cuing with CGA/minA (originally required mod/maxA). Pt performed mass practice of described weight shift in RW - Pt sat to rest in [facility] and PTA attempted to use blue theraband to fix L LE in neutral alignment per excessive external rotation presentation. PTA unable to do so at this time due to time.  - Pt ambulated 3 steps with improved weight shift (modA) that then required maxA and increased time to attempt to advance due to decreasing weight shift to R (increased push to L from R LE) - pt presents with increase in push when feeling unstable or going to fall).  NMR performed for improvements in motor control and coordination, balance, sequencing, judgement, and self confidence/ efficacy in performing all aspects of mobility at highest level of independence.   Patient  sitting in WC at end of session with brakes locked, belt alarm set, and all needs within reach.      Therapy Documentation Precautions:  Precautions Precautions: Fall Recall of Precautions/Restrictions: Impaired Restrictions Weight Bearing Restrictions Per Provider Order: No  Therapy/Group: Individual Therapy  Zaidin Blyden PTA 05/28/2024, 12:25 PM

## 2024-05-28 NOTE — Progress Notes (Signed)
 Uneventful night. Requested to go to [facility]. It takes 2 people for safety, she leans heavy to left. Continent void. LBM 09/12. Pocketed 1 pill this morning, able to swallow after  extra water given. Denies pain. [name]

## 2024-05-28 NOTE — Progress Notes (Signed)
 PROGRESS NOTE   Subjective/Complaints:  Pt doing well, slept well, denies pain, LBM 2 days ago which she states is normal for her. Urinating fine (intermittent incontinence documented). No other complaints or concerns.   ROS: as per HPI. Denies CP, SOB, abd pain, N/V/D, or any other complaints at this time.    Objective:   No results found.  Recent Labs    05/26/24 0428  WBC 8.8  HGB 14.0  HCT 43.4  PLT 178     No results for input(s): NA, K, CL, CO2, GLUCOSE, BUN, CREATININE, CALCIUM  in the last 72 hours.     Intake/Output Summary (Last 24 hours) at 05/28/2024 1254 Last data filed at 05/28/2024 0900 Gross per 24 hour  Intake 714 ml  Output --  Net 714 ml        Physical Exam: Vital Signs Blood pressure 100/83, pulse 87, temperature 98 F (36.7 C), resp. rate 18, height 5' 3.5 (1.613 m), weight 88.6 kg, SpO2 100%.   Constitutional: No distress . Vital signs reviewed. Sitting in w/c, working with PT.  HEENT: NCAT, EOMI, oral membranes moist Neck: supple Cardiovascular: RRR without murmur. No JVD    Respiratory/Chest: CTA Bilaterally without wheezes or rales. Normal effort    GI/Abdomen: BS +, non-tender, non-distended, soft Ext: no clubbing, cyanosis, or edema Psych: flat but generally pleasant and cooperative   Skin: No evidence of breakdown, no evidence of rash over exposed surfaces MSK: L sided weakness  PRIOR EXAMS: Neuro:   alert today.  Right gaze preference ongoing but can be cued to left verbally. Follows basic commands.  speech sl dysarthric, left central VII. MMT:LUE 0/5. trace Left elbow flexion and Hip Ext LLE 2- with knee ext synergy pattern  . Moves right side freely. Senses pain and light touch in all 4's. LUE DTR's 1+. LLE DTR's brisk 3+ with early extensor tone, toes up.  left pectoralis and elbow flexor tone MAS 1-2     Musculoskeletal: no pain with AROM/PROM  No  swelling LLE, no hamstrings or adductor pain. No pain with ROM  Assessment/Plan: 1. Functional deficits which require 3+ hours per day of interdisciplinary therapy in a comprehensive inpatient rehab setting. Physiatrist is providing close team supervision and 24 hour management of active medical problems listed below. Physiatrist and rehab team continue to assess barriers to discharge/monitor patient progress toward functional and medical goals  Care Tool:  Bathing    Body parts bathed by patient: Abdomen, Front perineal area, Left arm, Face, Chest, Right upper leg, Left upper leg, Left lower leg, Right lower leg, Right arm (using long sponge)   Body parts bathed by helper: Buttocks     Bathing assist Assist Level: Minimal Assistance - Patient > 75%     Upper Body Dressing/Undressing Upper body dressing   What is the patient wearing?: Pull over shirt    Upper body assist Assist Level: Moderate Assistance - Patient 50 - 74%    Lower Body Dressing/Undressing Lower body dressing      What is the patient wearing?: Incontinence brief, Pants     Lower body assist Assist for lower body dressing: Maximal Assistance - Patient 25 -  49%     Toileting Toileting    Toileting assist Assist for toileting: 2 Helpers     Transfers Chair/bed transfer  Transfers assist  Chair/bed transfer activity did not occur: Safety/medical concerns  Chair/bed transfer assist level: Moderate Assistance - Patient 50 - 74%     Locomotion Ambulation   Ambulation assist   Ambulation activity did not occur: Safety/medical concerns  Assist level: 2 helpers (for w/c follow) Assistive device: Other (comment) (R Hand rail) Max distance: 72ft   Walk 10 feet activity   Assist  Walk 10 feet activity did not occur: Safety/medical concerns  Assist level: 2 helpers Assistive device: Other (comment) (R Hand rail)   Walk 50 feet activity   Assist Walk 50 feet with 2 turns activity did not  occur: Safety/medical concerns         Walk 150 feet activity   Assist Walk 150 feet activity did not occur: Safety/medical concerns         Walk 10 feet on uneven surface  activity   Assist Walk 10 feet on uneven surfaces activity did not occur: Safety/medical concerns         Wheelchair     Assist Is the patient using a wheelchair?: Yes Type of Wheelchair: Manual    Wheelchair assist level: Dependent - Patient 0% Max wheelchair distance: 150    Wheelchair 50 feet with 2 turns activity    Assist        Assist Level: Dependent - Patient 0%   Wheelchair 150 feet activity     Assist      Assist Level: Dependent - Patient 0%   Blood pressure 100/83, pulse 87, temperature 98 F (36.7 C), resp. rate 18, height 5' 3.5 (1.613 m), weight 88.6 kg, SpO2 100%.  Medical Problem List and Plan: 1. 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 Pt is s/p IR w TICI2b. 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  Antithrombotics: -DVT/anticoagulation:  Pharmaceutical: Xarelto  20mg  daily             -antiplatelet therapy: N/A 3. Pain Management: Tylenol  prn for pain              --Has been on topamax  for HA in the past.  9/12 pain appears controlled 4. Mood/Behavior/Sleep: LCSW to follow for evaluaton and support.              --trazodone  prn for insomnia.              -antipsychotic agents: N/A 5. Neuropsych/cognition: This patient is capable of making decisions on her own behalf.  Cont amantadine  100mg  BID 9/4     6. Skin/Wound Care: Routine pressure relief measures.  7. Fluids/Electrolytes/Nutrition: Monitor I/O. Add CM restrictions to diet             -Post stroke Dysphagia --SLP following. D2 diet/thins            -encourage PO,  protein supp for low albumin  8. A fib: Monitor HR TID-- Metoprolol  succinate 200 mg daily  --Continue Xarelto .  9. HTN: Monitor BP TID. On metoprolol  and  aldactone  25mg  daily  -BP stable, monitor Vitals:   05/25/24 0432 05/25/24 0812 05/25/24 1413 05/25/24 1952  BP: 96/72 101/62 101/65 113/75   05/26/24 0448 05/26/24 1357 05/26/24 1454 05/26/24 2017  BP: 107/62 117/85 119/81 118/85   05/27/24 0505 05/27/24 1400 05/27/24 2025 05/28/24 0512  BP: 112/65 102/74 (!) 111/91 100/83     10. NICM w/HFrEF: Discussed with Cardiology Dr. Maceo. Digoxin  and metoprolol  tartrate was stooped. Entresto  not continued. She is continued on spironolactone  and Metoprolol  succinate 200mg  daily in the AM. Trying to keep regimen simple. Appreciate the follow up 11.  Pre-diabetes: had been On Farxiga . Added CM restrictions to diet.  --Monitor BS ac/hs and use SSI tor elevated BS.  Controlled 9/5 only daily checks -9/6-7/25 CBGs fine, could consider d/c checks if continued stability  -9/11 well controlled--dc'd cbg checks    12. HLD- LDL 109, crestor  40 mg daily  13. Obesity:  BMI 34.84 educate on diet and weight loss to promote overall health and mobility.  14. Leukocytosis: no s/s infection, resolved on abx 9/8    Latest Ref Rng & Units 05/26/2024    4:28 AM 05/23/2024    5:10 AM 05/19/2024    5:19 AM  CBC  WBC 4.0 - 10.5 K/uL 8.8  9.7  10.9   Hemoglobin 12.0 - 15.0 g/dL 85.9  86.0  86.2   Hematocrit 36.0 - 46.0 % 43.4  43.7  42.6   Platelets 150 - 400 K/uL 178  200  253      15. Slow transit constipation:   -miralax  scheduled daily with prns (refused 9/12) -had bm 9/11 after sorbitol   -add one senna-s at bedtime -05/28/24 LBM 2 days ago, which is normal for her, monitor til tomorrow 16. Cloudy malodorous urine per nursing 9/6, nursing also reported frequency/urge to urinate after emptying-- U/A shows WBC but no bact, UCx showing >100K e coli, sensitive to Keflex  500mg  TID x7d (started 9/7), continue,  hx PCN all but tolerated Keflex  in the past  17.  Intermittent Right foot pain , exam normal with exception of reduced pulses will check ABIs due to hx vascular occlusion, on Xarelto  no venous dopplers indicated at this time  -9/11-12 ABI's demonstrate moderate arterial disease on right and mild on the left. Observe for now. I don't see that right foot is impacting therapy right now. Foot is warm to touch   LOS: 19 days A FACE TO FACE EVALUATION WAS PERFORMED  [name] 05/28/2024, 12:54 PM

## 2024-05-29 DIAGNOSIS — K5901 Slow transit constipation: Secondary | ICD-10-CM | POA: Diagnosis not present

## 2024-05-29 DIAGNOSIS — I63521 Cerebral infarction due to unspecified occlusion or stenosis of right anterior cerebral artery: Secondary | ICD-10-CM | POA: Diagnosis not present

## 2024-05-29 DIAGNOSIS — I10 Essential (primary) hypertension: Secondary | ICD-10-CM | POA: Diagnosis not present

## 2024-05-29 NOTE — Plan of Care (Signed)
  Problem: Consults Goal: RH STROKE PATIENT EDUCATION Description: See Patient Education module for education specifics  Outcome: Progressing   Problem: Fluid Volume: Goal: Ability to maintain a balanced intake and output will improve Outcome: Progressing   Problem: Health Behavior/Discharge Planning: Goal: Ability to identify and utilize available resources and services will improve Outcome: Progressing

## 2024-05-29 NOTE — Progress Notes (Signed)
 PROGRESS NOTE   Subjective/Complaints:  Pt doing well again today, slept well, denies pain, LBM 3 days ago which she states is normal for her (had to be reminded of BM though). Urinating fine (intermittent/rare incontinence documented). No other complaints or concerns.   ROS: as per HPI. Denies CP, SOB, abd pain, N/V/D, or any other complaints at this time.    Objective:   No results found.  No results for input(s): WBC, HGB, HCT, PLT in the last 72 hours.    No results for input(s): NA, K, CL, CO2, GLUCOSE, BUN, CREATININE, CALCIUM  in the last 72 hours.     Intake/Output Summary (Last 24 hours) at 05/29/2024 1138 Last data filed at 05/29/2024 0900 Gross per 24 hour  Intake 950 ml  Output --  Net 950 ml        Physical Exam: Vital Signs Blood pressure 120/86, pulse 84, temperature 98.5 F (36.9 C), temperature source Oral, resp. rate 18, height 5' 3.5 (1.613 m), weight 88.6 kg, SpO2 99%.   Constitutional: No distress . Vital signs reviewed. Resting in bed, comfortable HEENT: NCAT, EOMI, oral membranes moist Neck: supple Cardiovascular: RRR without murmur. No JVD    Respiratory/Chest: CTA Bilaterally without wheezes or rales. Normal effort    GI/Abdomen: BS +, non-tender, non-distended, soft Ext: no clubbing, cyanosis, or edema Psych: flat but generally pleasant and cooperative   Skin: No evidence of breakdown, no evidence of rash over exposed surfaces MSK: L sided weakness Neuro: alert, recalls birthday was 5 days ago, but difficulty remembering LBM.   PRIOR EXAMS: Neuro:   alert today.  Right gaze preference ongoing but can be cued to left verbally. Follows basic commands.  speech sl dysarthric, left central VII. MMT:LUE 0/5. trace Left elbow flexion and Hip Ext LLE 2- with knee ext synergy pattern  . Moves right side freely. Senses pain and light touch in all 4's. LUE DTR's 1+. LLE  DTR's brisk 3+ with early extensor tone, toes up.  left pectoralis and elbow flexor tone MAS 1-2     Musculoskeletal: no pain with AROM/PROM  No swelling LLE, no hamstrings or adductor pain. No pain with ROM  Assessment/Plan: 1. Functional deficits which require 3+ hours per day of interdisciplinary therapy in a comprehensive inpatient rehab setting. Physiatrist is providing close team supervision and 24 hour management of active medical problems listed below. Physiatrist and rehab team continue to assess barriers to discharge/monitor patient progress toward functional and medical goals  Care Tool:  Bathing    Body parts bathed by patient: Abdomen, Front perineal area, Left arm, Face, Chest, Right upper leg, Left upper leg, Left lower leg, Right lower leg, Right arm (using long sponge)   Body parts bathed by helper: Buttocks     Bathing assist Assist Level: Minimal Assistance - Patient > 75%     Upper Body Dressing/Undressing Upper body dressing   What is the patient wearing?: Pull over shirt    Upper body assist Assist Level: Moderate Assistance - Patient 50 - 74%    Lower Body Dressing/Undressing Lower body dressing      What is the patient wearing?: Incontinence brief, Pants  Lower body assist Assist for lower body dressing: Maximal Assistance - Patient 25 - 49%     Toileting Toileting    Toileting assist Assist for toileting: 2 Helpers     Transfers Chair/bed transfer  Transfers assist  Chair/bed transfer activity did not occur: Safety/medical concerns  Chair/bed transfer assist level: Moderate Assistance - Patient 50 - 74%     Locomotion Ambulation   Ambulation assist   Ambulation activity did not occur: Safety/medical concerns  Assist level: 2 helpers (for w/c follow) Assistive device: Other (comment) (R Hand rail) Max distance: 58ft   Walk 10 feet activity   Assist  Walk 10 feet activity did not occur: Safety/medical concerns  Assist  level: 2 helpers Assistive device: Other (comment) (R Hand rail)   Walk 50 feet activity   Assist Walk 50 feet with 2 turns activity did not occur: Safety/medical concerns         Walk 150 feet activity   Assist Walk 150 feet activity did not occur: Safety/medical concerns         Walk 10 feet on uneven surface  activity   Assist Walk 10 feet on uneven surfaces activity did not occur: Safety/medical concerns         Wheelchair     Assist Is the patient using a wheelchair?: Yes Type of Wheelchair: Manual    Wheelchair assist level: Dependent - Patient 0% Max wheelchair distance: 150    Wheelchair 50 feet with 2 turns activity    Assist        Assist Level: Dependent - Patient 0%   Wheelchair 150 feet activity     Assist      Assist Level: Dependent - Patient 0%   Blood pressure 120/86, pulse 84, temperature 98.5 F (36.9 C), temperature source Oral, resp. rate 18, height 5' 3.5 (1.613 m), weight 88.6 kg, SpO2 99%.  Medical Problem List and Plan: 1. 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 Pt is s/p IR w TICI2b. 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  Antithrombotics: -DVT/anticoagulation:  Pharmaceutical: Xarelto  20mg  daily             -antiplatelet therapy: N/A 3. Pain Management: Tylenol  prn for pain              --Has been on topamax  for HA in the past.  9/12 pain appears controlled 4. Mood/Behavior/Sleep: LCSW to follow for evaluaton and support.              --trazodone  prn for insomnia.              -antipsychotic agents: N/A 5. Neuropsych/cognition: This patient is capable of making decisions on her own behalf.  Cont amantadine  100mg  BID 9/4     6. Skin/Wound Care: Routine pressure relief measures.   7. Fluids/Electrolytes/Nutrition: Monitor I/O. Add CM restrictions to diet             -Post stroke Dysphagia --SLP following. D2 diet/thins            -encourage PO, protein supp for low albumin  8. A fib: Monitor HR TID--  Metoprolol  succinate 200 mg daily  --Continue Xarelto .  9. HTN: Monitor BP TID. On metoprolol  and aldactone  25mg  daily  -BP stable, monitor Vitals:   05/25/24 1952 05/26/24 0448 05/26/24 1357 05/26/24 1454  BP: 113/75 107/62 117/85 119/81   05/26/24 2017 05/27/24 0505 05/27/24 1400 05/27/24 2025  BP: 118/85 112/65 102/74 (!) 111/91   05/28/24 0512 05/28/24 1454 05/28/24 1949 05/29/24 0409  BP: 100/83 102/73 105/67 120/86     10. NICM w/HFrEF: Discussed with Cardiology Dr. Maceo. Digoxin  and metoprolol  tartrate was stooped. Entresto  not continued. She is continued on spironolactone  and Metoprolol  succinate 200mg  daily in the AM. Trying to keep regimen simple. Appreciate the follow up 11.  Pre-diabetes: had been On Farxiga . Added CM restrictions to diet.  --Monitor BS ac/hs and use SSI tor elevated BS.  Controlled 9/5 only daily checks -9/6-7/25 CBGs fine, could consider d/c checks if continued stability  -9/11 well controlled--dc'd cbg checks    12. HLD- LDL 109, crestor  40 mg daily  13. Obesity:  BMI 34.84 educate on diet and weight loss to promote overall health and mobility.  14. Leukocytosis: no s/s infection, resolved on abx 9/8    Latest Ref Rng & Units 05/26/2024    4:28 AM 05/23/2024    5:10 AM 05/19/2024    5:19 AM  CBC  WBC 4.0 - 10.5 K/uL 8.8  9.7  10.9   Hemoglobin 12.0 - 15.0 g/dL 85.9  86.0  86.2   Hematocrit 36.0 - 46.0 % 43.4  43.7  42.6   Platelets 150 - 400 K/uL 178  200  253      15. Slow transit constipation:   -miralax  scheduled daily with prns (refused 9/12) -had bm 9/11 after sorbitol   -add one senna-s at bedtime -05/28/24 LBM 2 days ago, which is normal for her, monitor til tomorrow -05/29/24 LBM 3 days ago, still feels normal for  her, doesn't feel constipated-- if no BM by tomorrow, advance meds.  16. Cloudy malodorous urine per nursing 9/6, nursing also reported frequency/urge to urinate after emptying-- U/A shows WBC but no bact, UCx showing >100K e coli, sensitive to Keflex  500mg  TID x7d (started 9/7), continue, hx PCN all but tolerated Keflex  in the past -- completed 05/29/24 17.  Intermittent Right foot pain , exam normal with exception of reduced pulses will check ABIs due to hx vascular occlusion, on Xarelto  no venous dopplers indicated at this time  -9/11-12 ABI's demonstrate moderate arterial disease on right and mild on the left. Observe for now. I don't see that right foot is impacting therapy right now. Foot is warm to touch   LOS: 20 days A FACE TO FACE EVALUATION WAS PERFORMED  [address] 05/29/2024, 11:38 AM

## 2024-05-30 DIAGNOSIS — I63521 Cerebral infarction due to unspecified occlusion or stenosis of right anterior cerebral artery: Secondary | ICD-10-CM | POA: Diagnosis not present

## 2024-05-30 LAB — CBC
HCT: 45.2 % (ref 36.0–46.0)
Hemoglobin: 14.3 g/dL (ref 12.0–15.0)
MCH: 26.3 pg (ref 26.0–34.0)
MCHC: 31.6 g/dL (ref 30.0–36.0)
MCV: 83.2 fL (ref 80.0–100.0)
Platelets: 158 K/uL (ref 150–400)
RBC: 5.43 MIL/uL — ABNORMAL HIGH (ref 3.87–5.11)
RDW: 13.5 % (ref 11.5–15.5)
WBC: 8.6 K/uL (ref 4.0–10.5)
nRBC: 0 % (ref 0.0–0.2)

## 2024-05-30 LAB — BASIC METABOLIC PANEL WITH GFR
Anion gap: 15 (ref 5–15)
BUN: 7 mg/dL (ref 6–20)
CO2: 18 mmol/L — ABNORMAL LOW (ref 22–32)
Calcium: 9.7 mg/dL (ref 8.9–10.3)
Chloride: 110 mmol/L (ref 98–111)
Creatinine, Ser: 1.28 mg/dL — ABNORMAL HIGH (ref 0.44–1.00)
GFR, Estimated: 48 mL/min — ABNORMAL LOW (ref >60)
Glucose, Bld: 98 mg/dL (ref 70–99)
Potassium: 3.4 mmol/L — ABNORMAL LOW (ref 3.5–5.1)
Sodium: 143 mmol/L (ref 135–145)

## 2024-05-30 MED ORDER — POTASSIUM CHLORIDE CRYS ER 20 MEQ PO TBCR
40.0000 meq | EXTENDED_RELEASE_TABLET | Freq: Once | ORAL | Status: AC
  Administered 2024-05-30: 40 meq via ORAL

## 2024-05-30 NOTE — Progress Notes (Signed)
 PROGRESS NOTE   Subjective/Complaints:  Sitting balance much improved, also doing sit to stand from mat +2 minA , bears weight through LLE   ROS: as per HPI. Denies CP, SOB, abd pain, N/V/D, or any other complaints at this time.    Objective:   No results found.  Recent Labs    05/30/24 0543  WBC 8.6  HGB 14.3  HCT 45.2  PLT 158      Recent Labs    05/30/24 0543  NA 143  K 3.4*  CL 110  CO2 18*  GLUCOSE 98  BUN 7  CREATININE 1.28*  CALCIUM  9.7       Intake/Output Summary (Last 24 hours) at 05/30/2024 0848 Last data filed at 05/30/2024 9390 Gross per 24 hour  Intake 948 ml  Output --  Net 948 ml        Physical Exam: Vital Signs Blood pressure (!) 104/54, pulse 87, temperature 97.9 F (36.6 C), resp. rate 16, height 5' 3.5 (1.613 m), weight 88.6 kg, SpO2 99%.   General: No acute distress Mood and affect are appropriate Heart: Regular rate and rhythm no rubs murmurs or extra sounds Lungs: Clear to auscultation, breathing unlabored, no rales or wheezes Abdomen: Positive bowel sounds, soft nontender to palpation, nondistended Extremities: No clubbing, cyanosis, or edema Skin: No evidence of breakdown, no evidence of rash  PRIOR EXAMS: Neuro:   alert today.  Right gaze preference ongoing but can be cued to left verbally. Follows basic commands.  speech sl dysarthric, left central VII. MMT:LUE 0/5. trace Left elbow flexion and Hip Ext LLE 2- with knee ext synergy pattern  . Moves right side freely. Senses pain and light touch in all 4's. LUE DTR's 1+. LLE DTR's brisk 3+ with early extensor tone, toes up.  left pectoralis and elbow flexor tone MAS 1-2     Musculoskeletal: no pain with AROM/PROM  No swelling LLE, no hamstrings or adductor pain. No pain with ROM  Assessment/Plan: 1. Functional deficits which require 3+ hours per day of interdisciplinary therapy in a comprehensive inpatient rehab  setting. Physiatrist is providing close team supervision and 24 hour management of active medical problems listed below. Physiatrist and rehab team continue to assess barriers to discharge/monitor patient progress toward functional and medical goals  Care Tool:  Bathing    Body parts bathed by patient: Abdomen, Front perineal area, Left arm, Face, Chest, Right upper leg, Left upper leg, Left lower leg, Right lower leg, Right arm (using long sponge)   Body parts bathed by helper: Buttocks     Bathing assist Assist Level: Minimal Assistance - Patient > 75%     Upper Body Dressing/Undressing Upper body dressing   What is the patient wearing?: Pull over shirt    Upper body assist Assist Level: Moderate Assistance - Patient 50 - 74%    Lower Body Dressing/Undressing Lower body dressing      What is the patient wearing?: Incontinence brief, Pants     Lower body assist Assist for lower body dressing: Maximal Assistance - Patient 25 - 49%     Toileting Toileting    Toileting assist Assist for toileting: 2 Helpers  Transfers Chair/bed transfer  Transfers assist  Chair/bed transfer activity did not occur: Safety/medical concerns  Chair/bed transfer assist level: Moderate Assistance - Patient 50 - 74%     Locomotion Ambulation   Ambulation assist   Ambulation activity did not occur: Safety/medical concerns  Assist level: 2 helpers (for w/c follow) Assistive device: Other (comment) (R Hand rail) Max distance: 85ft   Walk 10 feet activity   Assist  Walk 10 feet activity did not occur: Safety/medical concerns  Assist level: 2 helpers Assistive device: Other (comment) (R Hand rail)   Walk 50 feet activity   Assist Walk 50 feet with 2 turns activity did not occur: Safety/medical concerns         Walk 150 feet activity   Assist Walk 150 feet activity did not occur: Safety/medical concerns         Walk 10 feet on uneven surface   activity   Assist Walk 10 feet on uneven surfaces activity did not occur: Safety/medical concerns         Wheelchair     Assist Is the patient using a wheelchair?: Yes Type of Wheelchair: Manual    Wheelchair assist level: Dependent - Patient 0% Max wheelchair distance: 150    Wheelchair 50 feet with 2 turns activity    Assist        Assist Level: Dependent - Patient 0%   Wheelchair 150 feet activity     Assist      Assist Level: Dependent - Patient 0%   Blood pressure (!) 104/54, pulse 87, temperature 97.9 F (36.6 C), resp. rate 16, height 5' 3.5 (1.613 m), weight 88.6 kg, SpO2 99%.  Medical Problem List and Plan: 1. 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 Pt is s/p IR w TICI2b. 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  Antithrombotics: -DVT/anticoagulation:  Pharmaceutical: Xarelto  20mg  daily             -antiplatelet therapy: N/A 3. Pain Management: Tylenol  prn for pain              --Has been on topamax  for HA in the past.  9/12 pain appears controlled 4. Mood/Behavior/Sleep: LCSW to follow for evaluaton and support.              --trazodone  prn for insomnia.              -antipsychotic agents: N/A 5. Neuropsych/cognition: This patient is capable of making decisions on her own behalf.  Hold amantadine  100mg  BID monitor for worsening lethargy     6. Skin/Wound Care: Routine pressure relief measures.  7. Fluids/Electrolytes/Nutrition: Monitor I/O. Add CM restrictions to diet             -Post stroke Dysphagia --SLP following. D2 diet/thins            -encourage PO, protein supp for low albumin     Latest Ref Rng & Units 05/30/2024    5:43 AM 05/23/2024    6:12 PM 05/16/2024    4:57 PM  BMP  Glucose 70 - 99 mg/dL 98   856  886  BUN 6 - 20 mg/dL 7  16  11    Creatinine 0.44 - 1.00 mg/dL 8.71  8.61  8.89   Sodium 135 - 145 mmol/L 143  136  137   Potassium 3.5 - 5.1 mmol/L 3.4  4.0  4.3   Chloride 98 - 111 mmol/L 110  105  102   CO2 22 - 32 mmol/L 18  19  21    Calcium  8.9 - 10.3 mg/dL 9.7  9.7  89.9    Low K+ 1x dose KCL, on aldactone , creat stable , BUN normal  8. A fib: Monitor HR TID-- Metoprolol  succinate 200 mg daily  --Continue Xarelto .  9. HTN: Monitor BP TID. On metoprolol  and aldactone  25mg  daily  -BP stable, monitor Vitals:   05/26/24 1454 05/26/24 2017 05/27/24 0505 05/27/24 1400  BP: 119/81 118/85 112/65 102/74   05/27/24 2025 05/28/24 0512 05/28/24 1454 05/28/24 1949  BP: (!) 111/91 100/83 102/73 105/67   05/29/24 0409 05/29/24 1409 05/29/24 2011 05/30/24 0353  BP: 120/86 109/72 139/83 (!) 104/54     10. NICM w/HFrEF: Discussed with Cardiology Dr. Maceo. Digoxin  and metoprolol  tartrate was stooped. Entresto  not continued. She is continued on spironolactone  and Metoprolol  succinate 200mg  daily in the AM. Trying to keep regimen simple. Appreciate the follow up 11.  Pre-diabetes: had been On Farxiga . Added CM restrictions to diet.  --Monitor BS ac/hs and use SSI tor elevated BS.  Controlled 9/5 only daily checks -9/6-7/25 CBGs fine, could consider d/c checks if continued stability  -9/11 well controlled--dc'd cbg checks    12. HLD- LDL 109, crestor  40 mg daily  13. Obesity:  BMI 34.84 educate on diet and weight loss to promote overall health and mobility.  14. Leukocytosis: no s/s infection, resolved on abx 9/8    Latest Ref Rng & Units 05/30/2024    5:43 AM 05/26/2024    4:28 AM 05/23/2024    5:10 AM  CBC  WBC 4.0 - 10.5 K/uL 8.6  8.8  9.7   Hemoglobin 12.0 - 15.0 g/dL 85.6  85.9  86.0   Hematocrit 36.0 - 46.0 % 45.2  43.4  43.7   Platelets 150 - 400 K/uL 158  178  200      15. Slow transit constipation:   -miralax  scheduled daily with prns (refused 9/12) Type 6 BM this am   16. Cloudy malodorous urine per nursing 9/6, nursing also reported frequency/urge to urinate after emptying-- U/A shows WBC but no bact, UCx showing >100K e coli, sensitive to Keflex  500mg  TID x7d (started 9/7), continue, hx PCN all but tolerated Keflex  in the past -- completed 05/29/24 17.  Intermittent Right foot pain , exam normal with exception of reduced pulses will check ABIs due to hx vascular occlusion, on Xarelto  no venous dopplers indicated at this time  -9/11-12 ABI's demonstrate moderate arterial disease on right and mild on the left. Observe for now. I don't see that right foot is impacting therapy right now. Foot is warm to touch  Can f/u with cardiology on this  LOS: 21 days A FACE TO FACE EVALUATION WAS PERFORMED  [name] 05/30/2024, 8:48 AM

## 2024-05-30 NOTE — Progress Notes (Signed)
 Occupational Therapy Session Note  Patient Details  Name: [name] MRN: [id number] Date of Birth: 01/06/1966  Today's Date: 05/30/2024 OT Individual Time: 9084-8969 OT Individual Time Calculation (min): 75 min    Short Term Goals: Week 1:  OT Short Term Goal 1 (Week 1): Pt will maintain static sit EOB with CGA to supervision to engage in UB self care safely. OT Short Term Goal 1 - Progress (Week 1): Met OT Short Term Goal 2 (Week 1): Pt will complete squat pivot transfers from bed >< w/c with mod A or less with 1 person to work towards a safe toilet transfers. OT Short Term Goal 2 - Progress (Week 1): Progressing toward goal OT Short Term Goal 3 (Week 1): Pt will demonstrate improved L arm awareness to attend to arm during bed mobility tasks with min cues. OT Short Term Goal 3 - Progress (Week 1): Met OT Short Term Goal 4 (Week 1): Pt will be able to don pull over shirt with mod A. OT Short Term Goal 4 - Progress (Week 1): Met Week 2:  OT Short Term Goal 1 (Week 2): Pt will complete squat pivot toilet transfers with mod A or less. OT Short Term Goal 1 - Progress (Week 2): Met OT Short Term Goal 2 (Week 2): Pt will be able to hold static stand with min A to enable caregiver to adjust clothing over hips. OT Short Term Goal 2 - Progress (Week 2): Met OT Short Term Goal 3 (Week 2): pt will be able to reach feet with long sponge to wash feet. OT Short Term Goal 3 - Progress (Week 2): Met OT Short Term Goal 4 (Week 2): pt will complete self ROM for LUE with min A. OT Short Term Goal 4 - Progress (Week 2): Met Week 3:  OT Short Term Goal 1 (Week 3): pt will complete UB Dressing with MIN A OT Short Term Goal 2 (Week 3): pt will complete 1/3 toileting tasks with MODA OT Short Term Goal 3 (Week 3): pt will complete one grooming task at sink MODI  Skilled Therapeutic Interventions/Progress Updates:    Pt received in w/c ready for therapy. She stated she was already washed up and ready to go.   Assisted her with donning shoes and AFO.  Pt taken to gym to focus on standing balance and sit to stands to improve her independence with toileting tasks.  Pt able to stand from wc and stand pivot to her Rside with min A and cues to step L foot back as she was turning.  +2 A from rehab tech for safety and to help provide positional targets. Pt worked on numerous repetitive stand >< sits from mat 3x in a row for several sets to implement motor memory and increase endurance.    Standing balance with weight shifting to R using mirror and 1 person on each side of her for visual feed back.  Placed yellow theraband looped around her waist for pt to practice pushing down over her hips and up around her hips to simulate LB dressing. Pt able to move band up and down several times with min A.   Pt feeling fatigued and asked to stop working on standing.  Her daughter arrived. From mat worked on PROM to RUE. She has less tone in elbow/ hand today so easier to achieve full PROM. Worked on table slides of sliding towel forward and back with total A and body on arm stretching with trunk rotation  to the R.     Pt does not like the estim on her arm but agreeable to trying it again. Tolerated an intensity of 18 with 10 sec on and off for 10 min to stimulate finger extension.  Integrated functional grasp and release task with stimulation for motor integration.  Pt continues to not demonstrate active movement of LUE.  Pt completed stand pvt trasnfer to L with mod A back to wc due to needing A to move LLE.    Pt returned to room with her daughter.   Therapy Documentation Precautions:  Precautions Precautions: Fall Recall of Precautions/Restrictions: Impaired Restrictions Weight Bearing Restrictions Per Provider Order: No Pain: Pain Assessment Pain Score: 0-No pain ADL: ADL Eating: Supervision/safety Grooming: Setup Upper Body Bathing: Supervision/safety Where Assessed-Upper Body Bathing:  Shower Lower Body Bathing: Minimal assistance Where Assessed-Lower Body Bathing: Shower Upper Body Dressing: Moderate assistance Where Assessed-Upper Body Dressing: Wheelchair Lower Body Dressing: Maximal assistance Where Assessed-Lower Body Dressing: Wheelchair Toileting: Maximal assistance Where Assessed-Toileting: Teacher, adult education: Moderate assistance Toilet Transfer Method: Stand pivot Toilet Transfer Equipment: Acupuncturist: Moderate assistance Film/video editor Method: Warden/ranger: Grab bars, Transfer tub bench   Therapy/Group: Individual Therapy  [name] 05/30/2024, 10:46 AM

## 2024-05-30 NOTE — Progress Notes (Signed)
 Speech Language Pathology Daily Session Note  Patient Details  Name: AFTEN LIPSEY MRN: [id number] Date of Birth: [birth year]  Today's Date: 05/30/2024 SLP Individual Time: 0101-0204 SLP Individual Time Calculation (min): 63 min  Short Term Goals: Week 3: SLP Short Term Goal 1 (Week 3): Patient will consume D3/thin with use of swallowing compensatory strategies given supervision multimodal A SLP Short Term Goal 2 (Week 3): Patient will demonstrate orientation to self, location, situation and time given supervision multimodal A SLP Short Term Goal 3 (Week 3): Patient will demonstrate attention to the L during functional tasks given mod multimodal A SLP Short Term Goal 4 (Week 3): Patient will recall activities completed in prior therapy sessions given min multimodal A  Skilled Therapeutic Interventions:  Patient was seen in PM to address cognitive re- training. Pt was alert and seated upright in WC upon SLP arrival. She was agreeable for session. Pt indep oriented to month, year, place and situation. She was oriented to day of week given min A. Pt recalled participation in OT session earlier in day and tasks completed with min A. SLP addressing recall of functional information this session through training in 'BE FAST' stroke symptoms. After a distracted delay, pt recalled 3 units of information with sup A and additional 4 units given min A. SLP further reviewed education binder with pt given information on CVA, heart failure, support groups, etc. She was also challenged in L inattention through a structured card game. Pt warranting min A throughout task to recall rules of game however attended to left side of bedside table with overall sup A. Pt was left upright in WC at conclusion of session. SLP to continue POC.   Pain Pain Assessment Pain Score: 0-No pain  Therapy/Group: Individual Therapy  [name] 05/30/2024, 2:20 PM

## 2024-05-30 NOTE — Progress Notes (Signed)
 Incontinent of urine x 2 & continent x 1 on my shift. 2 assist, patient with heavy lean to left. LBM 09/11. Will offer sorbitol  this morning. Liberty Seto A

## 2024-05-31 DIAGNOSIS — I63521 Cerebral infarction due to unspecified occlusion or stenosis of right anterior cerebral artery: Secondary | ICD-10-CM | POA: Diagnosis not present

## 2024-05-31 LAB — GLUCOSE, CAPILLARY: Glucose-Capillary: 109 mg/dL — ABNORMAL HIGH (ref 70–99)

## 2024-05-31 MED ORDER — ENSURE PLUS HIGH PROTEIN PO LIQD
237.0000 mL | ORAL | Status: DC
Start: 2024-06-01 — End: 2024-06-07
  Administered 2024-06-02 – 2024-06-06 (×2): 237 mL via ORAL

## 2024-05-31 NOTE — Progress Notes (Signed)
 Occupational Therapy Session Note  Patient Details  Name: [name] MRN: [id number] Date of Birth: 1966/07/03  Today's Date: 05/31/2024 OT Individual Time: 9169-9084 OT Individual Time Calculation (min): 45 min    Short Term Goals: Week 1:  OT Short Term Goal 1 (Week 1): Pt will maintain static sit EOB with CGA to supervision to engage in UB self care safely. OT Short Term Goal 1 - Progress (Week 1): Met OT Short Term Goal 2 (Week 1): Pt will complete squat pivot transfers from bed >< w/c with mod A or less with 1 person to work towards a safe toilet transfers. OT Short Term Goal 2 - Progress (Week 1): Progressing toward goal OT Short Term Goal 3 (Week 1): Pt will demonstrate improved L arm awareness to attend to arm during bed mobility tasks with min cues. OT Short Term Goal 3 - Progress (Week 1): Met OT Short Term Goal 4 (Week 1): Pt will be able to don pull over shirt with mod A. OT Short Term Goal 4 - Progress (Week 1): Met Week 2:  OT Short Term Goal 1 (Week 2): Pt will complete squat pivot toilet transfers with mod A or less. OT Short Term Goal 1 - Progress (Week 2): Met OT Short Term Goal 2 (Week 2): Pt will be able to hold static stand with min A to enable caregiver to adjust clothing over hips. OT Short Term Goal 2 - Progress (Week 2): Met OT Short Term Goal 3 (Week 2): pt will be able to reach feet with long sponge to wash feet. OT Short Term Goal 3 - Progress (Week 2): Met OT Short Term Goal 4 (Week 2): pt will complete self ROM for LUE with min A. OT Short Term Goal 4 - Progress (Week 2): Met Week 3:  OT Short Term Goal 1 (Week 3): pt will complete UB Dressing with MIN A OT Short Term Goal 2 (Week 3): pt will complete 1/3 toileting tasks with MODA OT Short Term Goal 3 (Week 3): pt will complete one grooming task at sink MODI  Skilled Therapeutic Interventions/Progress Updates:      Pt seen for BADL retraining of bathing, and dressing with a focus on L side awareness and  hemibathing and hemi dressing strategies. See ADL documentation below.   Overall excellent progress with transfers, sit to stands and standing balance to have bottom washed and during LB dressing. Pt can now stand with min guard A as she uses R hand to pull pants over hips with 50% help.  Due to no movement of LUE and L inattention she continues to need mod - max verbal cues to attend to L arm in shower,   mod cues and mod A with UB dressing and max A LB dressing.  Her general burden of care has become much less due to improved mobility.   Pt also completed oral care.   Resting in w/c with instructions to do 2 REP (room exercise programs) of L arm massage with R hand and self ROM for L elbow.  Belt alarm on and all needs met.   Therapy Documentation Precautions:  Precautions Precautions: Fall Recall of Precautions/Restrictions: Impaired Restrictions Weight Bearing Restrictions Per Provider Order: No   Pain: Pain Assessment Pain Score: 0-No pain ADL: ADL Eating: Set up Grooming: Modified independent Upper Body Bathing: Supervision/safety Where Assessed-Upper Body Bathing: Shower Lower Body Bathing: Minimal assistance Where Assessed-Lower Body Bathing: Shower Upper Body Dressing: Moderate assistance Where Assessed-Upper Body Dressing:  Wheelchair Lower Body Dressing: Maximal assistance Where Assessed-Lower Body Dressing: Wheelchair Toileting: Maximal assistance Where Assessed-Toileting: Teacher, adult education: Curator Method: Surveyor, minerals: Acupuncturist: Insurance underwriter Method: Warden/ranger: Grab bars, Transfer tub bench   Therapy/Group: Individual Therapy  Suraj Ramdass 05/31/2024, 9:34 AM

## 2024-05-31 NOTE — Plan of Care (Signed)
  Problem: RH Dressing Goal: LTG Patient will perform upper body dressing (OT) Description: LTG Patient will perform upper body dressing with assist, with/without cues (OT). Flowsheets (Taken 05/31/2024 1331) LTG: Pt will perform upper body dressing with assistance level of: (LTG downgraded due to difficulty with sequencing and nonfunctional LUE.) Minimal Assistance - Patient > 75% Note: LTG downgraded due to difficulty with sequencing and nonfunctional LUE.  Goal: LTG Patient will perform lower body dressing w/assist (OT) Description: LTG: Patient will perform lower body dressing with assist, with/without cues in positioning using equipment (OT) Flowsheets (Taken 05/31/2024 1331) LTG: Pt will perform lower body dressing with assistance level of: (LTG downgraded due to difficulty with sequencing and nonfunctional LUE.) Moderate Assistance - Patient 50 - 74% Note: LTG downgraded due to difficulty with sequencing and nonfunctional LUE.    Problem: RH Toileting Goal: LTG Patient will perform toileting task (3/3 steps) with assistance level (OT) Description: LTG: Patient will perform toileting task (3/3 steps) with assistance level (OT)  Flowsheets (Taken 05/31/2024 1331) LTG: Pt will perform toileting task (3/3 steps) with assistance level: (LTG downgraded due to difficulty with sequencing and nonfunctional LUE.) Moderate Assistance - Patient 50 - 74% Note: LTG downgraded due to difficulty with sequencing and nonfunctional LUE.

## 2024-05-31 NOTE — Progress Notes (Signed)
 Physical Therapy Session Note  Patient Details  Name: [name] MRN: [id number] Date of Birth: 11/15/1965  Today's Date: 05/31/2024 PT Individual Time: 1003-1047 PT Individual Time Calculation (min): 44 min   Short Term Goals: Week 3:  PT Short Term Goal 1 (Week 3): Pt will complete bed<>chair transfers wtih minA and LRAD PT Short Term Goal 2 (Week 3): Pt will propel her wheelchair 74ft with minA PT Short Term Goal 3 (Week 3): Pt will ambulate 9ft with modA and LRAD  Skilled Therapeutic Interventions/Progress Updates: Patient sitting in room with son present on entrance to room. Patient alert and agreeable to PT session.   Pt transported from room<>day room gym in [facility] dependently.   Details of injury: R MCA and ACA scattered infarcts. Pt is s/p IR w TICI2b. 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    He currently has an open healing wounda t surgical site of hip disartculation, that will require a skin protectant/positioning cushion.   Penne Matsu, ATP and Burman Milliner PT present for custom manual wheelchair evaluation. PT discussed with attending OT prior to evaluation with OT stating no OT needs for [facility] features.    Therapists, patient, and ATP discussed the following necessities for pt's custom wheelchair:  - 18in wide x 18in depth wheelchair - decision to select an ultra-lightweight [id number] manual wheelchair due to requiring specific features to allow independent and safe push-rim biomechanics and positioning for appropriate mobility indoors to allow increased independence with MRADLs.  - Standard hip seat belt to provide pelvic stability as pt tends to have significant posterior pelvic tilt causing her to slide forward in w/c placing her at risk for falling - 3 degrees of camber to allow pt increased independence with turning using R hemi-technique propulsion - 0.75 dump in the  wheelchair to decrease risk of posterior pelvic tilt and improve pt's stability to decrease risk of falling out of the chair - mid profile Roho cushion with air in the back for pressure relief due to pt having decreased ability to independently weight shift for pressure relief.  - Tension adjustable back for lateral trunk support due to impaired sitting balance - Extension brake handles to allow pt to operate wheel locks due to decreased reach and strength.    Pt in agreement with the above recommendations. ATP planning to provide loaner wheelchair Tuesday 9/19 at 2pm. Pt mobilized back to room using BUE.  Patient sitting in WC at end of session with brakes locked, family present, belt alarm set, and all needs within reach.      Therapy Documentation Precautions:  Precautions Precautions: Fall Recall of Precautions/Restrictions: Impaired Restrictions Weight Bearing Restrictions Per Provider Order: No  Therapy/Group: Individual Therapy  Aundra Espin PTA  05/31/2024, 4:28 PM

## 2024-05-31 NOTE — Progress Notes (Signed)
 Speech Language Pathology Weekly Progress and Session Note  Patient Details  Name: GENISE STRACK MRN: [id number] Date of Birth: 11-30-1965  Beginning of progress report period: May 24, 2024 End of progress report period: May 31, 2024  Today's Date: 05/31/2024 SLP Individual Time: 1100-1159 SLP Individual Time Calculation (min): 59 min  Short Term Goals: Week 3: SLP Short Term Goal 1 (Week 3): Patient will consume D3/thin with use of swallowing compensatory strategies given supervision multimodal A SLP Short Term Goal 1 - Progress (Week 3): Met SLP Short Term Goal 2 (Week 3): Patient will demonstrate orientation to self, location, situation and time given supervision multimodal A SLP Short Term Goal 2 - Progress (Week 3): Met SLP Short Term Goal 3 (Week 3): Patient will demonstrate attention to the L during functional tasks given mod multimodal A SLP Short Term Goal 3 - Progress (Week 3): Met SLP Short Term Goal 4 (Week 3): Patient will recall activities completed in prior therapy sessions given min multimodal A SLP Short Term Goal 4 - Progress (Week 3): Met    New Short Term Goals: Week 4: SLP Short Term Goal 1 (Week 4): STG = LTG due to ELOS  Weekly Progress Updates: Pt has made great gains and has met 3 of 3 STGs this reporting period due to improved cognition and dysphagia. Currently, patient continues to require supervision A for consumption of D3/thin diet, orientation and memory. Patient continues to require modA to attend to the L during functional tasks.   Pt/family eduction ongoing. Pt would benefit from continued ST intervention to maximize cognition and swallowing in order to maximize functional independence at d/c.  POC updated to reflect patients progress.    Intensity: Minumum of 1-2 x/day, 30 to 90 minutes Frequency: 3 to 5 out of 7 days Duration/Length of Stay: 9/23 Treatment/Interventions: Cognitive remediation/compensation;Speech/Language  facilitation;Functional tasks;Therapeutic Activities;Internal/external aids;Therapeutic Exercise;Dysphagia/aspiration precaution training;Patient/family education   Daily Session  Skilled Therapeutic Interventions:  Skilled therapy session focused on cognitive goals. SLP facilitated session by targeting orientation. Patient independently oriented to self, situation, location and time. SLP targeted L attention and problem solving through calendar task. Patient completed calendar according to verbalized directions with minA, however required modA to interpret. At the end of the session, patient utilized minA to name words according to clues. Patient left in [facility] with alarm set and call bell in reach. Continue POC     Pain None reported  Therapy/Group: Individual Therapy  Saul Fabiano M.A., CCC-SLP 05/31/2024, 12:00 PM

## 2024-05-31 NOTE — Progress Notes (Signed)
 Speech Language Pathology Daily Session Note  Patient Details  Name: [name] MRN: [id number] Date of Birth: 1965-11-24  Today's Date: 05/31/2024 SLP Individual Time: 0246-0332 SLP Individual Time Calculation (min): 46 min  Short Term Goals: Week 4: SLP Short Term Goal 1 (Week 4): STG = LTG due to ELOS  Skilled Therapeutic Interventions:  Patient was seen in PM to address cognitive re- training. Pt was easily alerted upon SLP arrival and agreeable for session. Pt oriented to time, place and situation indep. She recalled completion and specific tasks during PT, SLP, and OT sessions earlier in the day. Pt agreeable for session to be completed throughout hospital this date however first SLP provided training in heart failure precautions and daily management. She was subsequently challenged in recall of information after a delay of ~30 minutes where she recalled 3/5 components given min A. SLP challenging pt's L attention through various tasks throughout session. Through a scavenger hunt across CIR unit and hospital, pt attended to left side of an educational board to identify nutritional information and maps to locate units and places around hospital. Pt warranting between mod to max A throughout tasks. Awareness regarding L attention remains impaired with limited insight into severity of deficits as indicated by reduced awareness of amount of cues needed when asked for self perception regarding ease of task. At conclusion of session, pt was left upright in [facility] with call button within reach and chair alarm active. SLP to continue POC.  Pain Pain Assessment Pain Scale: 0-10 Pain Score: 0-No pain  Therapy/Group: Individual Therapy  [name] 05/31/2024, 4:02 PM

## 2024-05-31 NOTE — Progress Notes (Signed)
 Patient ID: [name], female   DOB: 04-08-1966, 58 y.o.   MRN: [id number]  Spoke with [facility] Medicaid CM to start the process for [facility] services, so in place at discharge. She has sent the form to complete and will complete and send back to her. She will reach out to the family to see what agencies they prefer and if five or seven days work better for them. Continue to work on discharge needs.

## 2024-05-31 NOTE — Progress Notes (Signed)
 PROGRESS NOTE   Subjective/Complaints:  Labs reviewed  Discussed need for smoking  cessation  Discussed CVD including PAD affecting RLE>LLE  ROS: as per HPI. Denies CP, SOB, abd pain, N/V/D, or any other complaints at this time.    Objective:   No results found.  Recent Labs    05/30/24 0543  WBC 8.6  HGB 14.3  HCT 45.2  PLT 158      Recent Labs    05/30/24 0543  NA 143  K 3.4*  CL 110  CO2 18*  GLUCOSE 98  BUN 7  CREATININE 1.28*  CALCIUM  9.7       Intake/Output Summary (Last 24 hours) at 05/31/2024 0820 Last data filed at 05/31/2024 0756 Gross per 24 hour  Intake 420 ml  Output 125 ml  Net 295 ml        Physical Exam: Vital Signs Blood pressure 123/65, pulse 79, temperature 97.8 F (36.6 C), resp. rate 16, height 5' 3.5 (1.613 m), weight 88.6 kg, SpO2 100%.   General: No acute distress Mood and affect are appropriate Heart: Regular rate and rhythm no rubs murmurs or extra sounds Lungs: Clear to auscultation, breathing unlabored, no rales or wheezes Abdomen: Positive bowel sounds, soft nontender to palpation, nondistended Extremities: No clubbing, cyanosis, or edema Skin: No evidence of breakdown, no evidence of rash   Neuro:   alert today.  Right gaze preference ongoing but can be cued to left verbally. Follows basic commands.  speech sl dysarthric, left central VII. MMT:LUE 0/5. trace Left elbow flexion and Hip Ext LLE 2- with knee ext synergy pattern  . Moves right side freely. Senses pain and light touch in all 4's. LUE DTR's 1+. LLE DTR's brisk 3+ with early extensor tone, toes up.  left pectoralis and elbow flexor tone MAS 1-2     Musculoskeletal: no pain with AROM/PROM  No swelling LLE, no hamstrings or adductor pain. No pain with ROM  Assessment/Plan: 1. Functional deficits which require 3+ hours per day of interdisciplinary therapy in a comprehensive inpatient rehab  setting. Physiatrist is providing close team supervision and 24 hour management of active medical problems listed below. Physiatrist and rehab team continue to assess barriers to discharge/monitor patient progress toward functional and medical goals  Care Tool:  Bathing    Body parts bathed by patient: Abdomen, Front perineal area, Left arm, Face, Chest, Right upper leg, Left upper leg, Left lower leg, Right lower leg, Right arm (using long sponge)   Body parts bathed by helper: Buttocks     Bathing assist Assist Level: Minimal Assistance - Patient > 75%     Upper Body Dressing/Undressing Upper body dressing   What is the patient wearing?: Pull over shirt    Upper body assist Assist Level: Moderate Assistance - Patient 50 - 74%    Lower Body Dressing/Undressing Lower body dressing      What is the patient wearing?: Incontinence brief, Pants     Lower body assist Assist for lower body dressing: Maximal Assistance - Patient 25 - 49%     Toileting Toileting    Toileting assist Assist for toileting: 2 Helpers     Transfers  Chair/bed transfer  Transfers assist  Chair/bed transfer activity did not occur: Safety/medical concerns  Chair/bed transfer assist level: Moderate Assistance - Patient 50 - 74%     Locomotion Ambulation   Ambulation assist   Ambulation activity did not occur: Safety/medical concerns  Assist level: 2 helpers (for w/c follow) Assistive device: Other (comment) (R Hand rail) Max distance: 20ft   Walk 10 feet activity   Assist  Walk 10 feet activity did not occur: Safety/medical concerns  Assist level: 2 helpers Assistive device: Other (comment) (R Hand rail)   Walk 50 feet activity   Assist Walk 50 feet with 2 turns activity did not occur: Safety/medical concerns         Walk 150 feet activity   Assist Walk 150 feet activity did not occur: Safety/medical concerns         Walk 10 feet on uneven surface   activity   Assist Walk 10 feet on uneven surfaces activity did not occur: Safety/medical concerns         Wheelchair     Assist Is the patient using a wheelchair?: Yes Type of Wheelchair: Manual    Wheelchair assist level: Dependent - Patient 0% Max wheelchair distance: 150    Wheelchair 50 feet with 2 turns activity    Assist        Assist Level: Dependent - Patient 0%   Wheelchair 150 feet activity     Assist      Assist Level: Dependent - Patient 0%   Blood pressure 123/65, pulse 79, temperature 97.8 F (36.6 C), resp. rate 16, height 5' 3.5 (1.613 m), weight 88.6 kg, SpO2 100%.  Medical Problem List and Plan: 1. 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 Pt is s/p IR w TICI2b. 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  Antithrombotics: -DVT/anticoagulation:  Pharmaceutical: Xarelto  20mg  daily             -antiplatelet therapy: N/A 3. Pain Management: Tylenol  prn for pain              --Has been on topamax  for HA in the past.  9/12 pain appears controlled 4. Mood/Behavior/Sleep: LCSW to follow for evaluaton and support.              --trazodone  prn for insomnia.              -antipsychotic agents: N/A 5. Neuropsych/cognition: This patient is capable of making decisions on her own behalf.  Hold amantadine  100mg  BID monitor for worsening lethargy     6. Skin/Wound Care: Routine pressure relief measures.  7. Fluids/Electrolytes/Nutrition: Monitor I/O. Add CM restrictions to diet             -Post stroke Dysphagia --SLP following. D2 diet/thins            -encourage PO, protein supp for low albumin     Latest Ref Rng & Units 05/30/2024    5:43 AM 05/23/2024    6:12 PM 05/16/2024    4:57 PM  BMP  Glucose 70 -  99 mg/dL 98  856  113   BUN 6 - 20 mg/dL 7  16  11    Creatinine 0.44 - 1.00 mg/dL 8.71  8.61  8.89   Sodium 135 - 145 mmol/L 143  136  137   Potassium 3.5 - 5.1 mmol/L 3.4  4.0  4.3   Chloride 98 - 111 mmol/L 110  105  102   CO2 22 - 32 mmol/L 18  19  21    Calcium  8.9 - 10.3 mg/dL 9.7  9.7  89.9    Low K+ 1x dose KCL, on aldactone , creat stable , BUN normal  8. A fib: Monitor HR TID-- Metoprolol  succinate 200 mg daily  --Continue Xarelto .  9. HTN: Monitor BP TID. On metoprolol  and aldactone  25mg  daily  -BP stable, monitor Vitals:   05/27/24 1400 05/27/24 2025 05/28/24 0512 05/28/24 1454  BP: 102/74 (!) 111/91 100/83 102/73   05/28/24 1949 05/29/24 0409 05/29/24 1409 05/29/24 2011  BP: 105/67 120/86 109/72 139/83   05/30/24 0353 05/30/24 1423 05/30/24 1933 05/31/24 0454  BP: (!) 104/54 101/81 120/84 123/65     10. NICM w/HFrEF: Discussed with Cardiology Dr. Maceo. Digoxin  and metoprolol  tartrate was stooped. Entresto  not continued. She is continued on spironolactone  and Metoprolol  succinate 200mg  daily in the AM. Trying to keep regimen simple. Appreciate the follow up 11.  Pre-diabetes: had been On Farxiga . Added CM restrictions to diet.  --Monitor BS ac/hs and use SSI tor elevated BS.  Controlled 9/5 only daily checks -9/6-7/25 CBGs fine, could consider d/c checks if continued stability  -9/11 well controlled--dc'd cbg checks    12. HLD- LDL 109, crestor  40 mg daily  13. Obesity:  BMI 34.84 educate on diet and weight loss to promote overall health and mobility.  14. Leukocytosis: no s/s infection, resolved on abx 9/8    Latest Ref Rng & Units 05/30/2024    5:43 AM 05/26/2024    4:28 AM 05/23/2024    5:10 AM  CBC  WBC 4.0 - 10.5 K/uL 8.6  8.8  9.7   Hemoglobin 12.0 - 15.0 g/dL 85.6  85.9  86.0   Hematocrit 36.0 - 46.0 % 45.2  43.4  43.7   Platelets 150 - 400 K/uL 158  178  200      15. Slow transit constipation:   -miralax  scheduled daily with prns (refused 9/12) Type 6 BM  this am  16. Cloudy malodorous urine per nursing 9/6, nursing also reported frequency/urge to urinate after emptying-- U/A shows WBC but no bact, UCx showing >100K e coli, sensitive to Keflex  500mg  TID x7d (started 9/7), continue, hx PCN all but tolerated Keflex  in the past -- completed 05/29/24 17.  Intermittent Right foot pain , exam normal with exception of reduced pulses will check ABIs due to hx vascular occlusion, on Xarelto  no venous dopplers indicated at this time  -9/11-12 ABI's demonstrate moderate arterial disease on right and mild on the left. Observe for now. I don't see that right foot is impacting therapy right now. Foot is warm to touch  Can f/u with cardiology on this  LOS: 22 days A FACE TO FACE EVALUATION WAS PERFORMED  [name] 05/31/2024, 8:20 AM

## 2024-05-31 NOTE — Plan of Care (Signed)
 Goals upgraded to reflect current progress Problem: RH Swallowing Goal: LTG Patient will consume least restrictive diet using compensatory strategies with assistance (SLP) Description: LTG:  Patient will consume least restrictive diet using compensatory strategies with assistance (SLP) Flowsheets (Taken 05/31/2024 1202) LTG: Pt Patient will consume least restrictive diet using compensatory strategies with assistance of (SLP): Supervision   Problem: RH Cognition - SLP Goal: RH LTG Patient will demonstrate orientation with cues Description:  LTG:  Patient will demonstrate orientation to person/place/time/situation with cues (SLP)   Flowsheets Taken 05/31/2024 1202 by Stephanie, Avilene Marrin F, CCC-SLP LTG: Patient will demonstrate orientation using cueing (SLP): Supervision Taken 05/11/2024 1132 by Lars Joane RAMAN, CCC-SLP LTG Patient will demonstrate orientation to:  Place  Situation  Time  Person   Problem: RH Problem Solving Goal: LTG Patient will demonstrate problem solving for (SLP) Description: LTG:  Patient will demonstrate problem solving for basic/complex daily situations with cues  (SLP) Flowsheets (Taken 05/31/2024 1202) LTG: Patient will demonstrate problem solving for (SLP): Basic daily situations LTG Patient will demonstrate problem solving for: Minimal Assistance - Patient > 75%   Problem: RH Memory Goal: LTG Patient will demonstrate ability for day to day (SLP) Description: LTG:   Patient will demonstrate ability for day to day recall/carryover during cognitive/linguistic activities with assist  (SLP) Flowsheets (Taken 05/31/2024 1202) LTG: Patient will demonstrate ability for day to day recall: New information LTG: Patient will demonstrate ability for day to day recall/carryover during cognitive/linguistic activities with assist (SLP): Supervision Goal: LTG Patient will use memory compensatory aids to (SLP) Description: LTG:  Patient will use memory compensatory aids to recall  biographical/new, daily complex information with cues (SLP) Flowsheets (Taken 05/11/2024 1132 by Lars Joane RAMAN, CCC-SLP) LTG: Patient will use memory compensatory aids to (SLP): Minimal Assistance - Patient > 75%   Problem: RH Attention Goal: LTG Patient will demonstrate this level of attention during functional activites (SLP) Description: LTG:  Patient will will demonstrate this level of attention during functional activites (SLP) Flowsheets Taken 05/31/2024 1202 Patient will demonstrate during cognitive/linguistic activities the attention type of: Sustained Taken 05/17/2024 1231 LTG: Patient will demonstrate this level of attention during cognitive/linguistic activities with assistance of (SLP): Moderate Assistance - Patient 50 - 74%

## 2024-05-31 NOTE — Progress Notes (Signed)
 Nutrition Follow Up  DOCUMENTATION CODES:   Obesity unspecified  INTERVENTION:  Continue Ensure Plus High Protein 1x/day, each supplement provides 350 kcal and 20 grams of protein. Pt has been refusing supplement and states she is not used to having a big meal in the morning so she hasn't needed the ONS; switched to offering ONS 1x/day in the afternoon  Continue Magic cup TID with meals, each supplement provides 290 kcal and 9 grams of protein  Encouraged adequate PO intake that helps meet increased calorie and protein needs related to participation in therapy sessions  NUTRITION DIAGNOSIS:   Increased nutrient needs related to  (rehab therapy participation) as evidenced by estimated needs. Remains applicable  GOAL:   Patient will meet greater than or equal to 90% of their needs Progressing  MONITOR:   PO intake, Supplement acceptance  REASON FOR ASSESSMENT:   Consult Diet education  ASSESSMENT:   Pt with hx HTN, diabetes, stroke (2019), and atrial fibrillation. Recent admission for code stroke 05/05/24-05/09/24 and experienced functional deficits including dysphagia. Admitted to CIR for rehab of deficits and continued work with SLP.  8/25 admitted to CIR 9/4 upgraded to DYS 2 9/12 upgraded to DYS 3  Spoke with pt who was finishing up session with SLP. Praised pt for efforts made in increasing intake. Per diet summary documentation, pt's eating an average 41% of meals which is significant improvement from previous 14% average. Pt reports she can tell her appetite is better and states it feels back to normal. Pt reports she is not used to eating a big breakfast so she has been trying to eat more at breakfast but can never seem to finish the whole portion. Pt has been refusing the morning Ensure routinely since she is not hungry when it is offered, switched Ensure to be offered 1x / day in the afternoon to help supplement calories and protein after daily therapy sessions. Pt has  made good progress in therapy and is on track for discharge early next week (9/23). Encouraged continued adequate intake and will monitor for any changes.  Average Meal Completion: 9/6-9/9: 14% average intake x 8 recorded meals 9/13-9/16: 41% average intake x 8 recorded meals  Medications reviewed and include:  Miralax  Senna   Labs reviewed   Diet Order:   Diet Order             DIET DYS 3 Room service appropriate? Yes; Fluid consistency: Thin  Diet effective now                   EDUCATION NEEDS:   Education needs have been addressed  Skin:  Skin Assessment: Reviewed RN Assessment  Last BM:  9/15 type 6  Height:   Ht Readings from Last 1 Encounters:  05/09/24 5' 3.5 (1.613 m)    Weight:   Wt Readings from Last 1 Encounters:  05/09/24 88.6 kg    Ideal Body Weight:  53.4 kg  BMI:  Body mass index is 34.06 kg/m.  Estimated Nutritional Needs:   Kcal:  1600-1800  Protein:  65-80g  Fluid:  1.6-1.8L    Josette Glance, MS, RDN, LDN Clinical Dietitian I Please reach out via secure chat

## 2024-05-31 NOTE — Progress Notes (Signed)
 Occupational Therapy Weekly Progress Note  Patient Details  Name: [name] MRN: [id number] Date of Birth: 1966-02-14  Beginning of progress report period: May 24, 2024 End of progress report period: May 31, 2024     Patient has met 2 of 3 short term goals.  She continues to make progress with left visual scanning and L side awareness along with overall postural control improvement.  She is progressing with her ability to rise to stand and hold the stand position during self care.     Patient continues to demonstrate the following deficits: abnormal tone, decreased visual perceptual skills, decreased attention to left, decreased memory and delayed processing, and decreased sitting balance, decreased standing balance, hemiplegia, and decreased balance strategies and therefore will continue to benefit from skilled OT intervention to enhance overall performance with BADL.  Patient progressing toward long term goals..  Continue plan of care. POC changes: Dressing and toileting goals of min A downgraded to mod A due to difficulty with sequencing and nonfunctional LUE.   OT Short Term Goals Week 1:  OT Short Term Goal 1 (Week 1): Pt will maintain static sit EOB with CGA to supervision to engage in UB self care safely. OT Short Term Goal 1 - Progress (Week 1): Met OT Short Term Goal 2 (Week 1): Pt will complete squat pivot transfers from bed >< w/c with mod A or less with 1 person to work towards a safe toilet transfers. OT Short Term Goal 2 - Progress (Week 1): Progressing toward goal OT Short Term Goal 3 (Week 1): Pt will demonstrate improved L arm awareness to attend to arm during bed mobility tasks with min cues. OT Short Term Goal 3 - Progress (Week 1): Met OT Short Term Goal 4 (Week 1): Pt will be able to don pull over shirt with mod A. OT Short Term Goal 4 - Progress (Week 1): Met Week 2:  OT Short Term Goal 1 (Week 2): Pt will complete squat pivot toilet transfers with mod  A or less. OT Short Term Goal 1 - Progress (Week 2): Met OT Short Term Goal 2 (Week 2): Pt will be able to hold static stand with min A to enable caregiver to adjust clothing over hips. OT Short Term Goal 2 - Progress (Week 2): Met OT Short Term Goal 3 (Week 2): pt will be able to reach feet with long sponge to wash feet. OT Short Term Goal 3 - Progress (Week 2): Met OT Short Term Goal 4 (Week 2): pt will complete self ROM for LUE with min A. OT Short Term Goal 4 - Progress (Week 2): Met Week 3:  OT Short Term Goal 1 (Week 3): pt will complete UB Dressing with MIN A OT Short Term Goal 1 - Progress (Week 3): Progressing toward goal OT Short Term Goal 2 (Week 3): pt will complete 1/3 toileting tasks with MODA OT Short Term Goal 2 - Progress (Week 3): Met OT Short Term Goal 3 (Week 3): pt will complete one grooming task at sink MODI OT Short Term Goal 3 - Progress (Week 3): Met Week 4:  OT Short Term Goal 1 (Week 4): STGs = LTGs   Therapy Documentation Precautions:  Precautions Precautions: Fall Recall of Precautions/Restrictions: Impaired Restrictions Weight Bearing Restrictions Per Provider Order: No  ADL: ADL Eating: Set up Grooming: Modified independent Upper Body Bathing: Supervision/safety Where Assessed-Upper Body Bathing: Shower Lower Body Bathing: Minimal assistance Where Assessed-Lower Body Bathing: Shower Upper Body Dressing: Moderate assistance  Where Assessed-Upper Body Dressing: Wheelchair Lower Body Dressing: Maximal assistance Where Assessed-Lower Body Dressing: Wheelchair Toileting: Maximal assistance Where Assessed-Toileting: Teacher, adult education: Curator Method: Surveyor, minerals: Acupuncturist: Insurance underwriter Method: Warden/ranger: Grab bars, Transfer tub bench   Ieshia Hatcher 05/31/2024, 1:35 PM

## 2024-06-01 DIAGNOSIS — I63521 Cerebral infarction due to unspecified occlusion or stenosis of right anterior cerebral artery: Secondary | ICD-10-CM | POA: Diagnosis not present

## 2024-06-01 NOTE — Plan of Care (Signed)
  Problem: Consults Goal: RH STROKE PATIENT EDUCATION Description: See Patient Education module for education specifics  Outcome: Progressing Goal: Nutrition Consult-if indicated Outcome: Progressing Goal: Diabetes Guidelines if Diabetic/Glucose > 140 Description: If diabetic or lab glucose is > 140 mg/dl - Initiate Diabetes/Hyperglycemia Guidelines & Document Interventions  Outcome: Progressing   Problem: RH BOWEL ELIMINATION Goal: RH STG MANAGE BOWEL WITH ASSISTANCE Description: STG Manage Bowel with mod I Assistance. Outcome: Progressing Goal: RH STG MANAGE BOWEL W/MEDICATION W/ASSISTANCE Description: STG Manage Bowel with Medication with mod I Assistance. Outcome: Progressing   Problem: RH SAFETY Goal: RH STG ADHERE TO SAFETY PRECAUTIONS W/ASSISTANCE/DEVICE Description: STG Adhere to Safety Precautions With cues Assistance/Device. Outcome: Progressing   Problem: RH KNOWLEDGE DEFICIT Goal: RH STG INCREASE KNOWLEDGE OF DIABETES Description: Patient and family will be able to manage DM using educational resources for medications and dietary modification independently Outcome: Progressing Goal: RH STG INCREASE KNOWLEDGE OF HYPERTENSION Description: Patient and family will be able to manage HTN using educational resources for medications and dietary modification independently Outcome: Progressing Goal: RH STG INCREASE KNOWLEDGE OF DYSPHAGIA/FLUID INTAKE Description: Patient and family will be able to manage Dysphagia using educational resources for medications and dietary modification independently Outcome: Progressing Goal: RH STG INCREASE KNOWLEGDE OF HYPERLIPIDEMIA Description: Patient and family will be able to manage HLD using educational resources for medications and dietary modification independently Outcome: Progressing Goal: RH STG INCREASE KNOWLEDGE OF STROKE PROPHYLAXIS Description: Patient and family will be able to manage secondary risks using educational resources  for medications and dietary modification independently Outcome: Progressing   Problem: Education: Goal: Ability to describe self-care measures that may prevent or decrease complications (Diabetes Survival Skills Education) will improve Outcome: Progressing Goal: Individualized Educational Video(s) Outcome: Progressing   Problem: Coping: Goal: Ability to adjust to condition or change in health will improve Outcome: Progressing   Problem: Fluid Volume: Goal: Ability to maintain a balanced intake and output will improve Outcome: Progressing   Problem: Health Behavior/Discharge Planning: Goal: Ability to identify and utilize available resources and services will improve Outcome: Progressing Goal: Ability to manage health-related needs will improve Outcome: Progressing

## 2024-06-01 NOTE — Progress Notes (Signed)
 PROGRESS NOTE   Subjective/Complaints:  No issues overnite, oriented to mo, date but said TUes vs Wed No pains,slept well   ROS: as per HPI. Denies CP, SOB, abd pain, N/V/D, or any other complaints at this time.    Objective:   No results found.  Recent Labs    05/30/24 0543  WBC 8.6  HGB 14.3  HCT 45.2  PLT 158      Recent Labs    05/30/24 0543  NA 143  K 3.4*  CL 110  CO2 18*  GLUCOSE 98  BUN 7  CREATININE 1.28*  CALCIUM  9.7       Intake/Output Summary (Last 24 hours) at 06/01/2024 0813 Last data filed at 05/31/2024 1818 Gross per 24 hour  Intake 340 ml  Output --  Net 340 ml        Physical Exam: Vital Signs Blood pressure (!) 104/58, pulse 82, temperature 97.9 F (36.6 C), resp. rate 18, height 5' 3.5 (1.613 m), weight 88.6 kg, SpO2 98%.   General: No acute distress Mood and affect are appropriate Heart: irregular rate and rhythm no rubs murmurs or extra sounds Lungs: Clear to auscultation, breathing unlabored, no rales or wheezes Abdomen: Positive bowel sounds, soft nontender to palpation, nondistended Extremities: No clubbing, cyanosis, or edema Skin: No evidence of breakdown, no evidence of rash   Neuro:   alert today.  Right gaze preference ongoing but can be cued to left verbally. Follows basic commands.  speech sl dysarthric, left central VII. MMT:LUE 0/5. trace Left elbow flexion and Hip Ext LLE 2- with knee ext synergy pattern  . Moves right side freely. Senses pain and light touch in all 4's. LUE DTR's 1+. LLE DTR's brisk 3+ with early extensor tone, toes up.  left pectoralis and elbow flexor tone MAS 1-2     Musculoskeletal: no pain with AROM/PROM  No swelling LLE, no hamstrings or adductor pain. No pain with ROM  Assessment/Plan: 1. Functional deficits which require 3+ hours per day of interdisciplinary therapy in a comprehensive inpatient rehab setting. Physiatrist is  providing close team supervision and 24 hour management of active medical problems listed below. Physiatrist and rehab team continue to assess barriers to discharge/monitor patient progress toward functional and medical goals  Care Tool:  Bathing    Body parts bathed by patient: Abdomen, Front perineal area, Left arm, Face, Chest, Right upper leg, Left upper leg, Left lower leg, Right lower leg, Right arm   Body parts bathed by helper: Buttocks     Bathing assist Assist Level: Minimal Assistance - Patient > 75%     Upper Body Dressing/Undressing Upper body dressing   What is the patient wearing?: Pull over shirt    Upper body assist Assist Level: Moderate Assistance - Patient 50 - 74%    Lower Body Dressing/Undressing Lower body dressing      What is the patient wearing?: Incontinence brief, Pants     Lower body assist Assist for lower body dressing: Maximal Assistance - Patient 25 - 49%     Toileting Toileting    Toileting assist Assist for toileting: 2 Helpers     Transfers Chair/bed transfer  Transfers assist  Chair/bed transfer activity did not occur: Safety/medical concerns  Chair/bed transfer assist level: Minimal Assistance - Patient > 75%     Locomotion Ambulation   Ambulation assist   Ambulation activity did not occur: Safety/medical concerns  Assist level: 2 helpers (for w/c follow) Assistive device: Other (comment) (R Hand rail) Max distance: 29ft   Walk 10 feet activity   Assist  Walk 10 feet activity did not occur: Safety/medical concerns  Assist level: 2 helpers Assistive device: Other (comment) (R Hand rail)   Walk 50 feet activity   Assist Walk 50 feet with 2 turns activity did not occur: Safety/medical concerns         Walk 150 feet activity   Assist Walk 150 feet activity did not occur: Safety/medical concerns         Walk 10 feet on uneven surface  activity   Assist Walk 10 feet on uneven surfaces activity did  not occur: Safety/medical concerns         Wheelchair     Assist Is the patient using a wheelchair?: Yes Type of Wheelchair: Manual    Wheelchair assist level: Dependent - Patient 0% Max wheelchair distance: 150    Wheelchair 50 feet with 2 turns activity    Assist        Assist Level: Dependent - Patient 0%   Wheelchair 150 feet activity     Assist      Assist Level: Dependent - Patient 0%   Blood pressure (!) 104/58, pulse 82, temperature 97.9 F (36.6 C), resp. rate 18, height 5' 3.5 (1.613 m), weight 88.6 kg, SpO2 98%.  Medical Problem List and Plan: 1. 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 Pt is s/p IR w TICI2b. 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 Formulized individual treatment plan based on medical history, underlying problem and comorbidities.   -PRAFO and WHO to help maintain hand and foot positioning 2.  Antithrombotics: -DVT/anticoagulation:  Pharmaceutical: Xarelto  20mg  daily             -antiplatelet therapy: N/A 3. Pain Management: Tylenol  prn for pain              --Has been on topamax  for HA in the past.  9/12 pain appears controlled 4. Mood/Behavior/Sleep: LCSW to follow for evaluaton and support.              --trazodone  prn for insomnia.              -antipsychotic agents: N/A 5. Neuropsych/cognition: This patient is capable of making decisions on her own behalf.  Hold amantadine  100mg  BID monitor for worsening lethargy     6. Skin/Wound Care: Routine pressure relief measures.  7. Fluids/Electrolytes/Nutrition: Monitor I/O. Add CM restrictions to diet             -Post stroke Dysphagia --SLP following. D2 diet/thins             -encourage PO, protein supp for low albumin     Latest Ref Rng & Units 05/30/2024  5:43 AM 05/23/2024    6:12 PM 05/16/2024    4:57 PM  BMP  Glucose 70 - 99 mg/dL 98  856  886   BUN 6 - 20 mg/dL 7  16  11    Creatinine 0.44 - 1.00 mg/dL 8.71  8.61  8.89   Sodium 135 - 145 mmol/L 143  136  137   Potassium 3.5 - 5.1 mmol/L 3.4  4.0  4.3   Chloride 98 - 111 mmol/L 110  105  102   CO2 22 - 32 mmol/L 18  19  21    Calcium  8.9 - 10.3 mg/dL 9.7  9.7  89.9    Low K+ 1x dose KCL, on aldactone , creat stable , BUN normal  8. A fib: Monitor HR TID-- Metoprolol  succinate 200 mg daily  --Continue Xarelto .  9. HTN: Monitor BP TID. On metoprolol  and aldactone  25mg  daily  -BP stable, monitor Vitals:   05/28/24 1454 05/28/24 1949 05/29/24 0409 05/29/24 1409  BP: 102/73 105/67 120/86 109/72   05/29/24 2011 05/30/24 0353 05/30/24 1423 05/30/24 1933  BP: 139/83 (!) 104/54 101/81 120/84   05/31/24 0454 05/31/24 1300 05/31/24 1958 06/01/24 0455  BP: 123/65 111/80 108/64 (!) 104/58   If BPs running consistently below 110 sys consider reducing aldactone  to 12.5mg  qd  10. NICM w/HFrEF:  Cardiology Dr. Francyne. Digoxin  and metoprolol  tartrate was stooped. Entresto  not continued. She is continued on spironolactone  and Metoprolol  succinate 200mg  daily in the AM. Trying to keep regimen simple. Appreciate the follow up 11.  Pre-diabetes: had been On Farxiga . Added CM restrictions to diet.  --Monitor BS ac/hs and use SSI tor elevated BS.  Controlled 9/5 only daily checks -9/6-7/25 CBGs fine, could consider d/c checks if continued stability  -9/11 well controlled--dc'd cbg checks    12. HLD- LDL 109, crestor  40 mg daily  13. Obesity:  BMI 34.84 educate on diet and weight loss to promote overall health and mobility.  14. Leukocytosis: no s/s infection, resolved on abx 9/8    Latest Ref Rng & Units 05/30/2024    5:43 AM 05/26/2024    4:28 AM 05/23/2024    5:10 AM  CBC  WBC 4.0 - 10.5 K/uL 8.6  8.8  9.7    Hemoglobin 12.0 - 15.0 g/dL 85.6  85.9  86.0   Hematocrit 36.0 - 46.0 % 45.2  43.4  43.7   Platelets 150 - 400 K/uL 158  178  200      15. Slow transit constipation:   -miralax  scheduled daily with prns (refused 9/12) Type 6 BM 9/15 16. Cloudy malodorous urine per nursing 9/6, nursing also reported frequency/urge to urinate after emptying-- U/A shows WBC but no bact, UCx showing >100K e coli, sensitive to Keflex  500mg  TID x7d (started 9/7), continue, hx PCN all but tolerated Keflex  in the past -- completed 05/29/24 17.  Intermittent Right foot pain , exam normal with exception of reduced pulses will check ABIs due to hx vascular occlusion, on Xarelto  no venous dopplers indicated at this time  -9/11-12 ABI's demonstrate moderate arterial disease on right and mild on the left. Observe for now. I don't see that right foot is impacting therapy right now. Foot is warm to touch  Can f/u with cardiology on this  LOS: 23 days A FACE TO FACE EVALUATION WAS PERFORMED  Prentice FORBES Compton 06/01/2024, 8:13 AM

## 2024-06-01 NOTE — Progress Notes (Signed)
 Occupational Therapy Session Note  Patient Details  Name: SIMRAH CHATHAM MRN: [id number] Date of Birth: 01-Nov-1965  Today's Date: 06/01/2024 OT Individual Time: 9154-9059 OT Individual Time Calculation (min): 55 min    Short Term Goals: Week 4:  OT Short Term Goal 1 (Week 4): STGs = LTGs  Skilled Therapeutic Interventions/Progress Updates:    Pt received supine with no c/o pain, agreeable to OT session, declining need for ADLs. She required mod A for management of the LLE to come to EOB but then was able to manage trunk and push herself to EOB. OT donned shoes with max A. Stand pivot to the w/c with min cueing and min A. She completed oral care with set up assist seated at the sink. Pt was taken via w/c to the therapy gym for time management. Worked on LUE NMR using the [employer] mobile arm support in conjunction with vibration therapy on muscle insertion and belly. With vibration she was able to complete about 10 degrees elbow extension, 20 degrees shoulder extension and horizontal adduction. OT provided hands on facilitation to end range to ensure muscle flexibility. Mirror provided anterior for increased visual attention to limb. Ended with mirror box therapy. Pt required mod cueing for visual attention to limb and instruction in use. She returned to her room. Pt was left sitting up in the wheelchair with all needs met, chair alarm set, and call bell within reach.   Therapy Documentation Precautions:  Precautions Precautions: Fall Recall of Precautions/Restrictions: Impaired Restrictions Weight Bearing Restrictions Per Provider Order: No  Therapy/Group: Individual Therapy  Nena VEAR Moats 06/01/2024, 8:56 AM

## 2024-06-01 NOTE — Progress Notes (Signed)
 Physical Therapy Session Note  Patient Details  Name: [name] MRN: [id number] Date of Birth: February 28, 1966  Today's Date: 06/01/2024 PT Individual Time: 8591-8551 PT Individual Time Calculation (min): 40 min   Short Term Goals: Week 3:  PT Short Term Goal 1 (Week 3): Pt will complete bed<>chair transfers wtih minA and LRAD PT Short Term Goal 2 (Week 3): Pt will propel her wheelchair 15ft with minA PT Short Term Goal 3 (Week 3): Pt will ambulate 8ft with modA and LRAD  Skilled Therapeutic Interventions/Progress Updates: Patient sitting in WC on entrance to room. Patient alert and agreeable to PT session.   Patient reported no pain during session. Pt transported from room<>day room gym in [facility] dependently. Pt participated in mass practice of transfers to the L to functionally simulate direction pt would have to do in order to pivot to toilet at home. Pt required closer to maxA to pivot to the L first few rounds due to decreased ability to obtain clearance/length of L LE advancement, as well as having to weight shift appropriately without manual facilitation/multimodal cues. Pt required advancement of L LE via PTA foot sliding pt's foot towards pivot point to improve quality of step length (mod/heavy modA). Pt then with 4lb ankle weight donned performing same transfer to L x 3 with improvement in abducted step to increase length, especially when pt adhered to cue to weight shift to R (pt progressed to modA). Pt then performed transfer x 1 to the L at end with same VC and weight shifts to increase pivot step. Pt transported back to room in [facility] dependently.   Patient sitting in WC at end of session with brakes locked, belt alarm set, and all needs within reach.      Therapy Documentation Precautions:  Precautions Precautions: Fall Recall of Precautions/Restrictions: Impaired Restrictions Weight Bearing Restrictions Per Provider Order: No  Tison Leibold PTA 06/01/2024, 4:06 PM

## 2024-06-01 NOTE — Progress Notes (Signed)
 Occupational Therapy Session Note  Patient Details  Name: [name] MRN: [id number] Date of Birth: 01/12/1966  Today's Date: 06/01/2024 OT Individual Time: 1120-1200 OT Individual Time Calculation (min): 40 min    Short Term Goals: Week 1:  OT Short Term Goal 1 (Week 1): Pt will maintain static sit EOB with CGA to supervision to engage in UB self care safely. OT Short Term Goal 1 - Progress (Week 1): Met OT Short Term Goal 2 (Week 1): Pt will complete squat pivot transfers from bed >< w/c with mod A or less with 1 person to work towards a safe toilet transfers. OT Short Term Goal 2 - Progress (Week 1): Progressing toward goal OT Short Term Goal 3 (Week 1): Pt will demonstrate improved L arm awareness to attend to arm during bed mobility tasks with min cues. OT Short Term Goal 3 - Progress (Week 1): Met OT Short Term Goal 4 (Week 1): Pt will be able to don pull over shirt with mod A. OT Short Term Goal 4 - Progress (Week 1): Met Week 2:  OT Short Term Goal 1 (Week 2): Pt will complete squat pivot toilet transfers with mod A or less. OT Short Term Goal 1 - Progress (Week 2): Met OT Short Term Goal 2 (Week 2): Pt will be able to hold static stand with min A to enable caregiver to adjust clothing over hips. OT Short Term Goal 2 - Progress (Week 2): Met OT Short Term Goal 3 (Week 2): pt will be able to reach feet with long sponge to wash feet. OT Short Term Goal 3 - Progress (Week 2): Met OT Short Term Goal 4 (Week 2): pt will complete self ROM for LUE with min A. OT Short Term Goal 4 - Progress (Week 2): Met Week 3:  OT Short Term Goal 1 (Week 3): pt will complete UB Dressing with MIN A OT Short Term Goal 1 - Progress (Week 3): Progressing toward goal OT Short Term Goal 2 (Week 3): pt will complete 1/3 toileting tasks with MODA OT Short Term Goal 2 - Progress (Week 3): Met OT Short Term Goal 3 (Week 3): pt will complete one grooming task at sink MODI OT Short Term Goal 3 - Progress  (Week 3): Met Week 4:  OT Short Term Goal 1 (Week 4): STGs = LTGs  Skilled Therapeutic Interventions/Progress Updates:    Pt received in w/c with her daughter [name] present.   Asked pt if she could show [name] the 2 REP (room exercise program)  ex I gave her yesterday. Pt recalled 1 and was able to demonstrate it, the other she needed mod cues to recall and how to perform (PROM for elbow). Suggested we begin hands on practice today.  OT and rehab tech demonstrated to [facility] with patient how she stands and transfers to toilet with use of grab bar on her R and how she stands to self cleanse and manage clothing.   Pt did extremely well with procedural recall of steps she needs to take.  After demonstration, [name] did hands on practice with OT providing guarding support.  Hospital doctor and [place] did well working together.    Cues for pt to keep gaze forward when she sits to keep hips in alignment. Cues for [name] to stay in close proximity (or dancing distance) so she has improved control of her mom's balance.  Their home bathroom may be problematic. The will measure doorway of bathroom.  The toilet is adjacent  to tub on its left so no place for her to have support on her R.  Talked with PT about continuing to practice transfers without any UE support except hand held and or a hemi walker.    Pt resting in w.c with all needs met and alarm on.   Therapy Documentation Precautions:  Precautions Precautions: Fall Recall of Precautions/Restrictions: Impaired Restrictions Weight Bearing Restrictions Per Provider Order: No    Vital Signs: Therapy Vitals Pulse Rate: 64 BP: 102/67 Pain: Pain Assessment Pain Scale: 0-10 Pain Score: 0-No pain ADL: ADL Eating: Set up Grooming: Modified independent Upper Body Bathing: Supervision/safety Where Assessed-Upper Body Bathing: Shower Lower Body Bathing: Minimal assistance Where Assessed-Lower Body Bathing: Shower Upper Body Dressing: Moderate assistance Where  Assessed-Upper Body Dressing: Wheelchair Lower Body Dressing: Maximal assistance Where Assessed-Lower Body Dressing: Wheelchair Toileting: Maximal assistance Where Assessed-Toileting: Teacher, adult education: Curator Method: Surveyor, minerals: Acupuncturist: Insurance underwriter Method: Warden/ranger: Grab bars, Transfer tub bench      Therapy/Group: Individual Therapy  Tyjah Hai 06/01/2024, 1:56 PM

## 2024-06-01 NOTE — Progress Notes (Signed)
 Speech Language Pathology Daily Session Note  Patient Details  Name: [name] MRN: [id number] Date of Birth: Feb 11, 1966  Today's Date: 06/01/2024 SLP Individual Time: 1001-1046 SLP Individual Time Calculation (min): 45 min  Short Term Goals: Week 4: SLP Short Term Goal 1 (Week 4): STG = LTG due to ELOS  Skilled Therapeutic Interventions:   Patient was seen in am to address cognitive re- training. Pt was alert and seated upright in WC upon SLP arrival. She was agreeable for session. Pt indep oriented to day of week. She recalled specific tasks completed during OT session completed earlier in am. During review from previous SLP session on prior date, pt recalled some heart failure information given min A. She was subsequently challenged to recall 3 heart failure zones throughout session. After a distracted delay of ~305 minutes she recalled information with min to mod A. In other minutes of session, SLP continuing to challenge left inattention through functional tasks. Pt was again challenged with guiding through hospital where she demonstrated some improvement of L attention when looking at a document with max cues needed initially with cues able to be faded to min A. While attending to signage including maps and signs in gift shop she ranged from sup to mod A this date. Pt returned to her rooom where she was left upright in [facility] with her dtr present. SLP to continue POC.   Pain Pain Assessment Pain Scale: 0-10 Pain Score: 0-No pain  Therapy/Group: Individual Therapy  [name] 06/01/2024, 12:40 PM

## 2024-06-01 NOTE — Progress Notes (Signed)
 Patient ID: [name], female   DOB: 01-29-1966, 58 y.o.   MRN: [id number]  Met with pt and daughter-[name] to give team conference progress this week and need for hands on education with family beginning now until discharge 9/23. Have scheduled [name] for tomorrow 9-12 and Monday. Will talk with other family members to get in also. PCS referral made and have found a home health agency to take accept her and provide services. Continue to work on discharge needs.

## 2024-06-02 LAB — POTASSIUM: Potassium: 3.6 mmol/L (ref 3.5–5.1)

## 2024-06-02 MED ORDER — SPIRONOLACTONE 12.5 MG HALF TABLET
12.5000 mg | ORAL_TABLET | Freq: Every day | ORAL | Status: DC
  Administered 2024-06-03 – 2024-06-07 (×5): 12.5 mg via ORAL
  Filled 2024-06-02 (×5): qty 1

## 2024-06-02 MED ORDER — POTASSIUM CHLORIDE CRYS ER 10 MEQ PO TBCR
10.0000 meq | EXTENDED_RELEASE_TABLET | Freq: Every day | ORAL | Status: DC
  Administered 2024-06-02 – 2024-06-07 (×6): 10 meq via ORAL
  Filled 2024-06-02 (×6): qty 1

## 2024-06-02 NOTE — Patient Care Conference (Signed)
 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 Prentice Compton Social Worker Present: Rhoda Clement, LCSW Nurse Present: Barnie Ronde, RN PT Present: Sherlean Perks, PT;Dominic [place], PTA OT Present: Recardo Maxwell, OT SLP Present: Recardo Mole, SLP PPS Coordinator present : Eleanor Colon, SLP     Current Status/Progress Goal Weekly Team Focus  Bowel/Bladder   incontinent at night of bowel and bladder   reposition and assess for skin breakdown   assess for skin breakdown each shift    Swallow/Nutrition/ Hydration   D3/thin   minA  trials of advanced textures, patient/family education, use of compensatory strategies    ADL's   pt is progressing well with transfers, standing and postural control along with L side awareness.  overall mod A with self care and transfers.   min A for bathing and UB dressing and toilet transfers, mod A toileting and LB dressing   LUE NMR, balance , ADL retraining, pt/fam education    Mobility   Bed Mobility = minA (when getting in and out of bed on the R), Transfers = modA stand pivot; Ambulation modA with HR, but maxA otherwise with RW (attempted)    overall minA (will need to downgrade)  Dynamic standing balance, ambulation, AFO consult, L  NMRE, family ed    Communication                Safety/Cognition/ Behavioral Observations  supervision-minA for verbal tasks, min-modA for functional L attention   supervision-minA   L attention during functional tasks (reading), memory, orientation w/ use of calendar    Pain   no pain noted   continue to assess for pain   assess for pain each shift    Skin   continue to use protective barrier cream for buttocks   monitor for skin breakdown  asses skin each shift for changes      Discharge Planning:  Family here daily and attending therapies with pt. PCS referral made and working on [facility] referral. Need DME recommendations from team   Team Discussion: Patient admitted post right ACA/MCA with dense left sided weakness and tone on left side with flexor withdrawal. Progress limited by poor proprioception, left in-attention, poor procedural memory and sequencing with left side tone . Postural/truncal control and carry over has improved.   Patient on target to meet rehab goals: Goals for bathing and dressing downgraded as she needs mod - max assist for ADLs. Needs supervision - min assit for verbal communication and mod assist for structured tasks. Making good progress with transfers; needs min assist to the right and mod assist for stand pivot transfers but limited by left sided tone. Some ambulation using a  RW but it is not functional.   *See Care Plan and progress notes for long and short-term goals.   Revisions to Treatment Plan:  UTI treated  Wheelchair eval 05/31/24  Teaching Needs: Safety, medications, transfers, toileting, etc   Current Barriers to Discharge: Decreased caregiver support, Home enviroment access/layout, and Incontinence  Possible Resolutions to Barriers: Family education HH follow up services DME: wheelchair Ramp for entry to home recommended     Medical  Summary Current Status: still with dense hemi, spasticity mainly LUE, BPs controlled in fact on low side  Barriers to Discharge: Spasticity   Possible Resolutions to [employer] Focus: family ed ongoing, level of alertness good off of amantadine , ongoing efforts to improve left neglect   Continued Need for Acute Rehabilitation Level of Care: The patient requires daily medical management by a physician with specialized training in physical medicine and rehabilitation for the following reasons: Direction of a multidisciplinary physical rehabilitation program to maximize functional independence : Yes Medical management of patient stability for increased activity during participation in an intensive rehabilitation regime.: Yes Analysis of laboratory values and/or radiology reports with any subsequent need for medication adjustment and/or medical intervention. : Yes   I attest that I was present, lead the team conference, and concur with the assessment and plan of the team.   Fredericka Sober B 06/02/2024, 8:37 AM

## 2024-06-02 NOTE — Progress Notes (Signed)
 Occupational Therapy Session Note  Patient Details  Name: [name] MRN: [id number] Date of Birth: 01/13/1966  Today's Date: 06/02/2024 OT Individual Time: 0930-1030 OT Individual Time Calculation (min): 60 min    Short Term Goals: Week 4:  OT Short Term Goal 1 (Week 4): STGs = LTGs  Skilled Therapeutic Interventions/Progress Updates:    Pt received in bed and declined need for a bath.  Pt sat up to EOB min A and had a dry brief on, but she needed further cleansing.  Attempted to have pt stand with me but she was not focusing well and leaning to the L so used the stedy for support instead. Pt stood in stedy to self cleanse with min A to support balance about 50% of the time. Donned new brief.  Her [facility] doctor arrived for family education at 9:45.   Because she was available to help had dtr provide balance A for pt as she worked to stand to don pants.    Reviewed hemidressing strategies.    Pt transferred to new loaner wc with min A and reviewed cues with dtr.  Pt stated she felt fine, but she appeared tired and more distracted today.  I had difficulty keeping her focus on the tasks.   Pt taken to tub room which is set up similar to her home. Discussed and practiced trasnfers in same method she would need to use at home.   Overall min A and mod cues for stand pivot w/c to toilet to tub bench and then into tub with assist to lift L leg over wall and then back to wc.  Amber did the last transfer with her mom.   Discussed need for hand held shower head with wall attachment,  grab bar on wall in tub. Pt will be provided with BSC and TTB.  If doorway too narrow for chair, suggested widening doorway or possibly a transport wc would fit.   If not, pt would have to use a BSC only and sponge bathe.   Pt and dtr went outside to get some fresh air before PT session.   Therapy Documentation Precautions:  Precautions Precautions: Fall Recall of Precautions/Restrictions: Impaired Restrictions Weight  Bearing Restrictions Per Provider Order: No   Pain: no c/o pain    ADL: ADL Eating: Set up Grooming: Modified independent Upper Body Bathing: Supervision/safety Where Assessed-Upper Body Bathing: Shower Lower Body Bathing: Minimal assistance Where Assessed-Lower Body Bathing: Shower Upper Body Dressing: Moderate assistance Where Assessed-Upper Body Dressing: Wheelchair Lower Body Dressing: Maximal assistance Where Assessed-Lower Body Dressing: Wheelchair Toileting: Maximal assistance Where Assessed-Toileting: Teacher, adult education: Curator Method: Surveyor, minerals: Acupuncturist: Insurance underwriter Method: Warden/ranger: Grab bars, Transfer tub bench   Therapy/Group: Individual Therapy  Alexys Gassett 06/02/2024, 8:55 AM

## 2024-06-02 NOTE — Progress Notes (Signed)
 Physical Therapy Session Note  Patient Details  Name: [name] MRN: [id number] Date of Birth: [birth year]  Today's Date: 06/02/2024 PT Individual Time: 1430-1500 PT Individual Time Calculation (min): 30 min   Short Term Goals: Week 3:  PT Short Term Goal 1 (Week 3): Pt will complete bed<>chair transfers wtih minA and LRAD PT Short Term Goal 2 (Week 3): Pt will propel her wheelchair 78ft with minA PT Short Term Goal 3 (Week 3): Pt will ambulate 7ft with modA and LRAD  Skilled Therapeutic Interventions/Progress Updates:      Pt presenting in wheelchair, in agreement to therapy treatment. She has no complaints of pain. Session focused on introducing sliding board transfers as recommended by primary PTA.   Pt taken to main gym and demo'd SB transfer from wheelchair to mat table. Pt completed blocked practice SB transfer back and forth from wheelchair to mat table at [facility] level. L neglect impacting awareness during transfer, especially while transferring to her L side. After repetition, improved awareness and movement patterns noted for transfer training.   Pt returned to her room, left sitting up in w/c with her needs met. Seat belt alarm on, call bell in lap, needs met.    Therapy Documentation Precautions:  Precautions Precautions: Fall Recall of Precautions/Restrictions: Impaired Restrictions Weight Bearing Restrictions Per Provider Order: No General:     Therapy/Group: Individual Therapy  Sherlean SHAUNNA Perks 06/02/2024, 7:53 AM

## 2024-06-02 NOTE — Progress Notes (Signed)
 Patient ID: [name], female   DOB: 1966/01/12, 58 y.o.   MRN: [id number] Have sent PCS forms to [facility]. She will process them and set up services after talking with the family.

## 2024-06-02 NOTE — Progress Notes (Signed)
 Physical Therapy Session Note  Patient Details  Name: [name] MRN: [id number] Date of Birth: [birth year]  Today's Date: 06/02/2024 PT Individual Time: 8884-8842; 1306 - 1401 PT Individual Time Calculation (min): 42 min; 55 min   Short Term Goals: Week 3:  PT Short Term Goal 1 (Week 3): Pt will complete bed<>chair transfers wtih minA and LRAD PT Short Term Goal 2 (Week 3): Pt will propel her wheelchair 45ft with minA PT Short Term Goal 3 (Week 3): Pt will ambulate 52ft with modA and LRAD  SESSION 1 Skilled Therapeutic Interventions/Progress Updates: Patient following personal care with daughter on entrance to room (time missed due to). Patient alert and agreeable to PT session on re-entry to room with daughter assisting back to [facility] from [facility] with pt requiring VC to control descent with R UE supported on STEDY bar.   Patient reported no pain. Pt daughter stated need to finish school work and would participate in therapy session this afternoon on functional transfers/mobility in bed. Pt transported from room<>day room gym in [employer] for time management.   Wheelchair Mobility:  Pt propelled wheelchair around nsg/day room loop with only R LE to coordinate sequence. Pt required max multimodal cuing on extending R hip into ground with heel with pt requiring maxA to propel/turn. Pt demonstrated difficulty with coordinating turn to R vs L after mass practice (but could turn R once introducing R UE on R drive wheel grip). PTA utilized yellow theraband pulling R LE into hip flexion with cues for pt to extend against resistance to improve feedback to required musculature activation to propel. Pt continued mass practice after rest break with improving LOA required to propel with minA, but modA to turn, and maxA to safely navigate obstacles due to increase in L neglect from dual-tasking.   Patient sitting in WC at end of session with brakes locked, daughter present, and all needs within reach.  SESSION  2 Skilled Therapeutic Interventions/Progress Updates: Patient sitting in WC on entrance to room. Patient alert and agreeable to PT session.   Patient reported no pain during session.   Therapeutic Activity: Transfers: Pt performed sit<>stand transfers throughout session with minA to stand, and maxA to pivot to car during family education with increased time required and multimodal cuing for weight shift and initiation of movement (pt report increase in fatigue). Pt attempted transfer into increased height of daughters car, but was unable to achieve adequate power up to scoot back without totalA. PTA discussed with pt and pt daughter about using a sedan if available (which there is) as it will be safer and less LOA required. Pt performed pivot to sedan height to the L with same maxA with daughter assisting and PTA as well for safety due to pt's fatigued presentation. Pt also required occasional manual placement of LE, or at least assistance to achieve neutral/increased step length. Pt cued to weight shift accordingly and required increased time to perform transfer. PTA discussed with pt and pt daughter about heavy cuing of step placement, weight shift and safety measures, especially when pt is more fatigued, which will likely require slide board transfer vs stand pivot.   - Pt with AFO consult and ambulated 30' with RHR and overall modA (PTA providing anterior hip translation with occasional step placement to avoid excessive external rotation. Pt also cued to increase R step length to improve L hip flexor muscle recruitment. [facility] to prepare L posterior leaf AFO with possible wedge to see if that  will assist with external rotation and with toe cap.   Patient sitting in WC at end of session with brakes locked, belt alarm set, and all needs within reach.      Therapy Documentation Precautions:  Precautions Precautions: Fall Recall of Precautions/Restrictions:  Impaired Restrictions Weight Bearing Restrictions Per Provider Order: No  Therapy/Group: Individual Therapy  Alaysha Jefcoat PTA 06/02/2024, 12:07 PM

## 2024-06-02 NOTE — Progress Notes (Incomplete)
 PROGRESS NOTE   Subjective/Complaints:    ROS: as per HPI. Denies CP, SOB, abd pain, N/V/D, or any other complaints at this time.    Objective:   No results found.  No results for input(s): WBC, HGB, HCT, PLT in the last 72 hours.     Recent Labs    06/02/24 0548  K 3.6       Intake/Output Summary (Last 24 hours) at 06/02/2024 9177 Last data filed at 06/02/2024 0804 Gross per 24 hour  Intake 960 ml  Output --  Net 960 ml        Physical Exam: Vital Signs Blood pressure 109/74, pulse 71, temperature 97.9 F (36.6 C), resp. rate 18, height 5' 3.5 (1.613 m), weight 88.6 kg, SpO2 100%.   General: No acute distress Mood and affect are appropriate Heart: irregular rate and rhythm no rubs murmurs or extra sounds Lungs: Clear to auscultation, breathing unlabored, no rales or wheezes Abdomen: Positive bowel sounds, soft nontender to palpation, nondistended Extremities: No clubbing, cyanosis, or edema Skin: No evidence of breakdown, no evidence of rash   Neuro:   alert today.  Right gaze preference ongoing but can be cued to left verbally. Follows basic commands.  speech sl dysarthric, left central VII. MMT:LUE 0/5. trace Left elbow flexion and Hip Ext LLE 2- with knee ext synergy pattern  . Moves right side freely. Senses pain and light touch in all 4's. LUE DTR's 1+. LLE DTR's brisk 3+ with early extensor tone, toes up.  left pectoralis and elbow flexor tone MAS 1-2     Musculoskeletal: no pain with AROM/PROM  No swelling LLE, no hamstrings or adductor pain. No pain with ROM  Assessment/Plan: 1. Functional deficits which require 3+ hours per day of interdisciplinary therapy in a comprehensive inpatient rehab setting. Physiatrist is providing close team supervision and 24 hour management of active medical problems listed below. Physiatrist and rehab team continue to assess barriers to discharge/monitor  patient progress toward functional and medical goals  Care Tool:  Bathing    Body parts bathed by patient: Abdomen, Front perineal area, Left arm, Face, Chest, Right upper leg, Left upper leg, Left lower leg, Right lower leg, Right arm   Body parts bathed by helper: Buttocks     Bathing assist Assist Level: Minimal Assistance - Patient > 75%     Upper Body Dressing/Undressing Upper body dressing   What is the patient wearing?: Pull over shirt    Upper body assist Assist Level: Moderate Assistance - Patient 50 - 74%    Lower Body Dressing/Undressing Lower body dressing      What is the patient wearing?: Incontinence brief, Pants     Lower body assist Assist for lower body dressing: Maximal Assistance - Patient 25 - 49%     Toileting Toileting    Toileting assist Assist for toileting: 2 Helpers     Transfers Chair/bed transfer  Transfers assist  Chair/bed transfer activity did not occur: Safety/medical concerns  Chair/bed transfer assist level: Minimal Assistance - Patient > 75%     Locomotion Ambulation   Ambulation assist   Ambulation activity did not occur: Safety/medical concerns  Assist level:  2 helpers (for w/c follow) Assistive device: Other (comment) (R Hand rail) Max distance: 58ft   Walk 10 feet activity   Assist  Walk 10 feet activity did not occur: Safety/medical concerns  Assist level: 2 helpers Assistive device: Other (comment) (R Hand rail)   Walk 50 feet activity   Assist Walk 50 feet with 2 turns activity did not occur: Safety/medical concerns         Walk 150 feet activity   Assist Walk 150 feet activity did not occur: Safety/medical concerns         Walk 10 feet on uneven surface  activity   Assist Walk 10 feet on uneven surfaces activity did not occur: Safety/medical concerns         Wheelchair     Assist Is the patient using a wheelchair?: Yes Type of Wheelchair: Manual    Wheelchair assist  level: Dependent - Patient 0% Max wheelchair distance: 150    Wheelchair 50 feet with 2 turns activity    Assist        Assist Level: Dependent - Patient 0%   Wheelchair 150 feet activity     Assist      Assist Level: Dependent - Patient 0%   Blood pressure 109/74, pulse 71, temperature 97.9 F (36.6 C), resp. rate 18, height 5' 3.5 (1.613 m), weight 88.6 kg, SpO2 100%.  Medical Problem List and Plan: 1. 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 Pt is s/p IR w TICI2b. 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 Formulized individual treatment plan based on medical history, underlying problem and comorbidities.   -PRAFO and WHO to help maintain hand and foot positioning 2.  Antithrombotics: -DVT/anticoagulation:  Pharmaceutical: Xarelto  20mg  daily             -antiplatelet therapy: N/A 3. Pain Management: Tylenol  prn for pain              --Has been on topamax  for HA in the past.  9/12 pain appears controlled 4. Mood/Behavior/Sleep: LCSW to follow for evaluaton and support.              --trazodone  prn for insomnia.              -antipsychotic agents: N/A 5. Neuropsych/cognition: This patient is capable of making decisions on her own behalf.  Hold amantadine  100mg  BID monitor for worsening lethargy     6. Skin/Wound Care: Routine pressure relief measures.  7. Fluids/Electrolytes/Nutrition: Monitor I/O. Add CM restrictions to diet             -Post stroke Dysphagia --SLP following. D2 diet/thins            -encourage PO, protein supp for low albumin     Latest Ref Rng & Units 06/02/2024    5:48 AM 05/30/2024    5:43 AM 05/23/2024    6:12 PM  BMP  Glucose 70 - 99  mg/dL  98  856   BUN 6 - 20 mg/dL  7  16   Creatinine 9.55 - 1.00 mg/dL  1.28  1.38   Sodium 135 - 145 mmol/L  143  136   Potassium 3.5 - 5.1 mmol/L 3.6  3.4  4.0   Chloride 98 - 111 mmol/L  110  105   CO2 22 - 32 mmol/L  18  19   Calcium  8.9 - 10.3 mg/dL  9.7  9.7    Low K+ 1x dose KCL, on aldactone , creat stable , BUN normal  8. A fib: Monitor HR TID-- Metoprolol  succinate 200 mg daily  --Continue Xarelto .  9. HTN: Monitor BP TID. On metoprolol  and aldactone  25mg  daily  -BP stable, monitor Vitals:   05/29/24 2011 05/30/24 0353 05/30/24 1423 05/30/24 1933  BP: 139/83 (!) 104/54 101/81 120/84   05/31/24 0454 05/31/24 1300 05/31/24 1958 06/01/24 0455  BP: 123/65 111/80 108/64 (!) 104/58   06/01/24 1009 06/01/24 1604 06/01/24 1951 06/02/24 0348  BP: 102/67 97/81 102/78 109/74   If BPs running consistently below 110 sys consider reducing aldactone  to 12.5mg  qd  10. NICM w/HFrEF:  Cardiology Dr. Francyne. Digoxin  and metoprolol  tartrate was stooped. Entresto  not continued. She is continued on spironolactone  and Metoprolol  succinate 200mg  daily in the AM. Trying to keep regimen simple. Appreciate the follow up 11.  Pre-diabetes: had been On Farxiga . Added CM restrictions to diet.  --Monitor BS ac/hs and use SSI tor elevated BS.  Controlled 9/5 only daily checks -9/6-7/25 CBGs fine, could consider d/c checks if continued stability  -9/11 well controlled--dc'd cbg checks    12. HLD- LDL 109, crestor  40 mg daily  13. Obesity:  BMI 34.84 educate on diet and weight loss to promote overall health and mobility.  14. Leukocytosis: no s/s infection, resolved on abx 9/8    Latest Ref Rng & Units 05/30/2024    5:43 AM 05/26/2024    4:28 AM 05/23/2024    5:10 AM  CBC  WBC 4.0 - 10.5 K/uL 8.6  8.8  9.7   Hemoglobin 12.0 - 15.0 g/dL 85.6  85.9  86.0   Hematocrit 36.0 - 46.0 % 45.2  43.4  43.7   Platelets 150 - 400 K/uL 158  178  200      15. Slow transit constipation:   -miralax  scheduled daily  with prns (refused 9/12) Type 6 BM 9/15 16. Cloudy malodorous urine per nursing 9/6, nursing also reported frequency/urge to urinate after emptying-- U/A shows WBC but no bact, UCx showing >100K e coli, sensitive to Keflex  500mg  TID x7d (started 9/7), continue, hx PCN all but tolerated Keflex  in the past -- completed 05/29/24 17.  Intermittent Right foot pain , exam normal with exception of reduced pulses will check ABIs due to hx vascular occlusion, on Xarelto  no venous dopplers indicated at this time  -9/11-12 ABI's demonstrate moderate arterial disease on right and mild on the left. Observe for now. I don't see that right foot is impacting therapy right now. Foot is warm to touch  Can f/u with cardiology on this  LOS: 24 days A FACE TO FACE EVALUATION WAS PERFORMED  [name] 06/02/2024, 8:22 AM

## 2024-06-02 NOTE — Progress Notes (Signed)
 Speech Language Pathology Daily Session Note  Patient Details  Name: [name] MRN: [id number] Date of Birth: Sep 08, 1966  Today's Date: 06/02/2024 SLP Individual Time: 0900-0930 SLP Individual Time Calculation (min): 30 min  Short Term Goals: Week 4: SLP Short Term Goal 1 (Week 4): STG = LTG due to ELOS  Skilled Therapeutic Interventions: Skilled therapy session focused on education and cognitive goals. At the beginning of the session, patient recalled 4/6 BEFAST stroke s/sx independently and remaining 2 with minA. SLP educated patient on current level of cognitive functioning and current diet recommendations of D3/thin. SLP provided handout with food recommendations and reviewed safe swallowing strategies including lingual/digital sweep and liquid wash to ensure clearance of buccal pocketing. SLP also discussed patients L inattention and provided handout with ideas re: activities to complete upon d/c. SLP recommended patients family take over higher level cognitive tasks including medication management and finances. Patients daughter present at the end of the session and in agreement with no further questions/concerns. Patient left in bed with alarm set and call bell in reach. Continue POC.  Pain None reported   Therapy/Group: Individual Therapy  Jathen Sudano M.A., CCC-SLP 06/02/2024, 7:42 AM

## 2024-06-02 NOTE — Plan of Care (Signed)
  Problem: Consults Goal: RH STROKE PATIENT EDUCATION Description: See Patient Education module for education specifics  Outcome: Progressing Goal: Nutrition Consult-if indicated Outcome: Progressing Goal: Diabetes Guidelines if Diabetic/Glucose > 140 Description: If diabetic or lab glucose is > 140 mg/dl - Initiate Diabetes/Hyperglycemia Guidelines & Document Interventions  Outcome: Progressing   Problem: RH BOWEL ELIMINATION Goal: RH STG MANAGE BOWEL WITH ASSISTANCE Description: STG Manage Bowel with mod I Assistance. Outcome: Progressing Goal: RH STG MANAGE BOWEL W/MEDICATION W/ASSISTANCE Description: STG Manage Bowel with Medication with mod I Assistance. Outcome: Progressing   Problem: RH SAFETY Goal: RH STG ADHERE TO SAFETY PRECAUTIONS W/ASSISTANCE/DEVICE Description: STG Adhere to Safety Precautions With cues Assistance/Device. Outcome: Progressing   Problem: RH KNOWLEDGE DEFICIT Goal: RH STG INCREASE KNOWLEDGE OF DIABETES Description: Patient and family will be able to manage DM using educational resources for medications and dietary modification independently Outcome: Progressing Goal: RH STG INCREASE KNOWLEDGE OF HYPERTENSION Description: Patient and family will be able to manage HTN using educational resources for medications and dietary modification independently Outcome: Progressing Goal: RH STG INCREASE KNOWLEDGE OF DYSPHAGIA/FLUID INTAKE Description: Patient and family will be able to manage Dysphagia using educational resources for medications and dietary modification independently Outcome: Progressing Goal: RH STG INCREASE KNOWLEGDE OF HYPERLIPIDEMIA Description: Patient and family will be able to manage HLD using educational resources for medications and dietary modification independently Outcome: Progressing Goal: RH STG INCREASE KNOWLEDGE OF STROKE PROPHYLAXIS Description: Patient and family will be able to manage secondary risks using educational resources  for medications and dietary modification independently Outcome: Progressing   Problem: Education: Goal: Ability to describe self-care measures that may prevent or decrease complications (Diabetes Survival Skills Education) will improve Outcome: Progressing Goal: Individualized Educational Video(s) Outcome: Progressing   Problem: Coping: Goal: Ability to adjust to condition or change in health will improve Outcome: Progressing   Problem: Fluid Volume: Goal: Ability to maintain a balanced intake and output will improve Outcome: Progressing   Problem: Health Behavior/Discharge Planning: Goal: Ability to identify and utilize available resources and services will improve Outcome: Progressing Goal: Ability to manage health-related needs will improve Outcome: Progressing   Problem: Metabolic: Goal: Ability to maintain appropriate glucose levels will improve Outcome: Progressing   Problem: Nutritional: Goal: Maintenance of adequate nutrition will improve Outcome: Progressing Goal: Progress toward achieving an optimal weight will improve Outcome: Progressing   Problem: Skin Integrity: Goal: Risk for impaired skin integrity will decrease Outcome: Progressing   Problem: Tissue Perfusion: Goal: Adequacy of tissue perfusion will improve Outcome: Progressing

## 2024-06-03 DIAGNOSIS — I63521 Cerebral infarction due to unspecified occlusion or stenosis of right anterior cerebral artery: Secondary | ICD-10-CM | POA: Diagnosis not present

## 2024-06-03 LAB — POTASSIUM: Potassium: 3.6 mmol/L (ref 3.5–5.1)

## 2024-06-03 NOTE — Progress Notes (Signed)
 Occupational Therapy Session Note  Patient Details  Name: [name] MRN: [id number] Date of Birth: 08-05-1966  Today's Date: 06/03/2024 OT Individual Time: 9169-9084 OT Individual Time Calculation (min): 45 min    Short Term Goals: Week 4:  OT Short Term Goal 1 (Week 4): STGs = LTGs  Skilled Therapeutic Interventions/Progress Updates:    Pt received in w/c and agreeable to a shower.  Due to no tech present and limited therapy session time, told pt I would use the stedy lift with her.  Pt stated that was fine.  Pt wanted to pick out new clothing out of drawers.  She continues to have limited L side and safety awareness because in her attempt to move closer to the dresser she scooted herself forward in the seat of w/c vs moving the wc and almost slid out of the seat. It took max cues for pt to understand her positioning and scoot back.   Pt chose clothing.   With stedy lift, pt able to move to stand from wc and toilet and tub bench all with min A and held standing in stedy with min A.   Pt toileted (documented in flow sheet), showered and dressed.  She seemed to have more difficulty with attending to L visual field and L side of body today. Due to time constraints, I provided her with more assist than usual.  Mod A bathing and max dressing.  Pt did hold sitting balance well on tub bench.   Pt transferred back to w/c with belt alarm on and all needs met.   Therapy Documentation Precautions:  Precautions Precautions: Fall Recall of Precautions/Restrictions: Impaired Restrictions Weight Bearing Restrictions Per Provider Order: No    Vital Signs: Therapy Vitals Pulse Rate: 76 BP: 102/65 Pain:  No c/o pain     Therapy/Group: Individual Therapy  Idonna Heeren 06/03/2024, 8:38 AM

## 2024-06-03 NOTE — Plan of Care (Signed)
  Problem: Consults Goal: RH STROKE PATIENT EDUCATION Description: See Patient Education module for education specifics  Outcome: Progressing Goal: Nutrition Consult-if indicated Outcome: Progressing Goal: Diabetes Guidelines if Diabetic/Glucose > 140 Description: If diabetic or lab glucose is > 140 mg/dl - Initiate Diabetes/Hyperglycemia Guidelines & Document Interventions  Outcome: Progressing   Problem: RH BOWEL ELIMINATION Goal: RH STG MANAGE BOWEL WITH ASSISTANCE Description: STG Manage Bowel with mod I Assistance. Outcome: Progressing Goal: RH STG MANAGE BOWEL W/MEDICATION W/ASSISTANCE Description: STG Manage Bowel with Medication with mod I Assistance. Outcome: Progressing   Problem: RH SAFETY Goal: RH STG ADHERE TO SAFETY PRECAUTIONS W/ASSISTANCE/DEVICE Description: STG Adhere to Safety Precautions With cues Assistance/Device. Outcome: Progressing   Problem: RH KNOWLEDGE DEFICIT Goal: RH STG INCREASE KNOWLEDGE OF DIABETES Description: Patient and family will be able to manage DM using educational resources for medications and dietary modification independently Outcome: Progressing Goal: RH STG INCREASE KNOWLEDGE OF HYPERTENSION Description: Patient and family will be able to manage HTN using educational resources for medications and dietary modification independently Outcome: Progressing Goal: RH STG INCREASE KNOWLEDGE OF DYSPHAGIA/FLUID INTAKE Description: Patient and family will be able to manage Dysphagia using educational resources for medications and dietary modification independently Outcome: Progressing Goal: RH STG INCREASE KNOWLEGDE OF HYPERLIPIDEMIA Description: Patient and family will be able to manage HLD using educational resources for medications and dietary modification independently Outcome: Progressing Goal: RH STG INCREASE KNOWLEDGE OF STROKE PROPHYLAXIS Description: Patient and family will be able to manage secondary risks using educational resources  for medications and dietary modification independently Outcome: Progressing   Problem: Education: Goal: Ability to describe self-care measures that may prevent or decrease complications (Diabetes Survival Skills Education) will improve Outcome: Progressing Goal: Individualized Educational Video(s) Outcome: Progressing   Problem: Coping: Goal: Ability to adjust to condition or change in health will improve Outcome: Progressing   Problem: Fluid Volume: Goal: Ability to maintain a balanced intake and output will improve Outcome: Progressing   Problem: Health Behavior/Discharge Planning: Goal: Ability to identify and utilize available resources and services will improve Outcome: Progressing Goal: Ability to manage health-related needs will improve Outcome: Progressing

## 2024-06-03 NOTE — Progress Notes (Signed)
 PROGRESS NOTE   Subjective/Complaints:  No issues overnite, discussed BP and K+ issues  LBM 9/18  ROS: as per HPI. Denies CP, SOB, abd pain, N/V/D, or any other complaints at this time.    Objective:   No results found.  No results for input(s): WBC, HGB, HCT, PLT in the last 72 hours.     Recent Labs    06/02/24 0548 06/03/24 0510  K 3.6 3.6       Intake/Output Summary (Last 24 hours) at 06/03/2024 0801 Last data filed at 06/02/2024 2000 Gross per 24 hour  Intake 960 ml  Output --  Net 960 ml        Physical Exam: Vital Signs Blood pressure 116/83, pulse 66, temperature 97.9 F (36.6 C), resp. rate 18, height 5' 3.5 (1.613 m), weight 88.6 kg, SpO2 100%.   General: No acute distress Mood and affect are appropriate Heart: irregular rate and rhythm no rubs murmurs or extra sounds Lungs: Clear to auscultation, breathing unlabored, no rales or wheezes Abdomen: Positive bowel sounds, soft nontender to palpation, nondistended Extremities: No clubbing, cyanosis, or edema Skin: No evidence of breakdown, no evidence of rash   Neuro:   alert today.  Right gaze preference ongoing but can be cued to left verbally. Follows basic commands.  speech sl dysarthric, left central VII. MMT:LUE 0/5. trace Left elbow flexion and Hip Ext LLE 2- with knee ext synergy pattern  . Moves right side freely. Senses pain and light touch in all 4's. LUE DTR's 1+. LLE DTR's brisk 3+ with early extensor tone, toes up.  left pectoralis and elbow flexor tone MAS 1-2     Musculoskeletal: no pain with AROM/PROM  No swelling LLE, no hamstrings or adductor pain. No pain with ROM  Assessment/Plan: 1. Functional deficits which require 3+ hours per day of interdisciplinary therapy in a comprehensive inpatient rehab setting. Physiatrist is providing close team supervision and 24 hour management of active medical problems listed  below. Physiatrist and rehab team continue to assess barriers to discharge/monitor patient progress toward functional and medical goals  Care Tool:  Bathing    Body parts bathed by patient: Abdomen, Front perineal area, Left arm, Face, Chest, Right upper leg, Left upper leg, Left lower leg, Right lower leg, Right arm   Body parts bathed by helper: Buttocks     Bathing assist Assist Level: Minimal Assistance - Patient > 75%     Upper Body Dressing/Undressing Upper body dressing   What is the patient wearing?: Pull over shirt    Upper body assist Assist Level: Moderate Assistance - Patient 50 - 74%    Lower Body Dressing/Undressing Lower body dressing      What is the patient wearing?: Incontinence brief, Pants     Lower body assist Assist for lower body dressing: Maximal Assistance - Patient 25 - 49%     Toileting Toileting    Toileting assist Assist for toileting: 2 Helpers     Transfers Chair/bed transfer  Transfers assist  Chair/bed transfer activity did not occur: Safety/medical concerns  Chair/bed transfer assist level: Minimal Assistance - Patient > 75%     Locomotion Ambulation   Ambulation  assist   Ambulation activity did not occur: Safety/medical concerns  Assist level: 2 helpers (for w/c follow) Assistive device: Other (comment) (R Hand rail) Max distance: 42ft   Walk 10 feet activity   Assist  Walk 10 feet activity did not occur: Safety/medical concerns  Assist level: 2 helpers Assistive device: Other (comment) (R Hand rail)   Walk 50 feet activity   Assist Walk 50 feet with 2 turns activity did not occur: Safety/medical concerns         Walk 150 feet activity   Assist Walk 150 feet activity did not occur: Safety/medical concerns         Walk 10 feet on uneven surface  activity   Assist Walk 10 feet on uneven surfaces activity did not occur: Safety/medical concerns         Wheelchair     Assist Is the  patient using a wheelchair?: Yes Type of Wheelchair: Manual    Wheelchair assist level: Dependent - Patient 0% Max wheelchair distance: 150    Wheelchair 50 feet with 2 turns activity    Assist        Assist Level: Dependent - Patient 0%   Wheelchair 150 feet activity     Assist      Assist Level: Dependent - Patient 0%   Blood pressure 116/83, pulse 66, temperature 97.9 F (36.6 C), resp. rate 18, height 5' 3.5 (1.613 m), weight 88.6 kg, SpO2 100%.  Medical Problem List and Plan: 1. 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 Pt is s/p IR w TICI2b. 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  Antithrombotics: -DVT/anticoagulation:  Pharmaceutical: Xarelto  20mg  daily             -antiplatelet therapy: N/A 3. Pain Management: Tylenol  prn for pain              --Has been on topamax  for HA in the past.  9/12 pain appears controlled 4. Mood/Behavior/Sleep: LCSW to follow for evaluaton and support.              --trazodone  prn for insomnia.              -antipsychotic agents: N/A 5. Neuropsych/cognition: This patient is capable of making decisions on her own behalf.  Hold amantadine  100mg  BID monitor for worsening lethargy     6. Skin/Wound Care: Routine pressure relief measures.  7. Fluids/Electrolytes/Nutrition: Monitor I/O. Add CM restrictions to diet             -Post stroke Dysphagia --SLP following. D2 diet/thins            hypoK+ now on daily KCL    Latest Ref Rng & Units 06/03/2024    5:10 AM 06/02/2024    5:48 AM 05/30/2024    5:43 AM  BMP  Glucose 70 - 99 mg/dL   98   BUN 6 - 20 mg/dL   7   Creatinine 9.55 - 1.00 mg/dL   8.71   Sodium 864 - 854 mmol/L   143   Potassium 3.5 - 5.1 mmol/L 3.6  3.6  3.4   Chloride 98 - 111 mmol/L    110   CO2 22 - 32 mmol/L   18   Calcium  8.9 - 10.3 mg/dL   9.7    Low K+ 1x dose KCL, on aldactone , creat stable , BUN normal  8. A fib: Monitor HR TID-- Metoprolol  succinate 200 mg daily  --Continue Xarelto .  9. HTN: Monitor BP TID. On metoprolol  and aldactone  25mg  daily  -BP stable, monitor Vitals:   05/30/24 1933 05/31/24 0454 05/31/24 1300 05/31/24 1958  BP: 120/84 123/65 111/80 108/64   06/01/24 0455 06/01/24 1009 06/01/24 1604 06/01/24 1951  BP: (!) 104/58 102/67 97/81 102/78   06/02/24 0348 06/02/24 1534 06/02/24 1935 06/03/24 0319  BP: 109/74 108/89 105/74 116/83  Reduced aldactone  to 12.5mg  every day first dose 9/19  10. NICM w/HFrEF:  Cardiology Dr. Francyne. Digoxin  and metoprolol  tartrate was stooped. Entresto  not continued. She is continued on spironolactone  and Metoprolol  succinate 200mg  daily in the AM. Trying to keep regimen simple. Appreciate the follow up 11.  Pre-diabetes: had been On Farxiga . Added CM restrictions to diet.  --Monitor BS ac/hs and use SSI tor elevated BS.  Controlled 9/5 only daily checks -9/6-7/25 CBGs fine, could consider d/c checks if continued stability  -9/11 well controlled--dc'd cbg checks    12. HLD- LDL 109, crestor  40 mg daily  13. Obesity:  BMI 34.84 educate on diet and weight loss to promote overall health and mobility.  14. Leukocytosis: no s/s infection, resolved on abx 9/8    Latest Ref Rng & Units 05/30/2024    5:43 AM 05/26/2024    4:28 AM 05/23/2024    5:10 AM  CBC  WBC 4.0 - 10.5 K/uL 8.6  8.8  9.7   Hemoglobin 12.0 - 15.0 g/dL 85.6  85.9  86.0   Hematocrit 36.0 - 46.0 % 45.2  43.4  43.7   Platelets 150 - 400 K/uL 158  178  200      15. Slow transit constipation:   -miralax  scheduled daily with prns (refused 9/12) Type 6 BM 9/15 16. Cloudy malodorous urine per nursing 9/6, nursing also reported frequency/urge to urinate after emptying-- U/A shows WBC but no bact, UCx showing >100K e coli, sensitive to Keflex  500mg  TID x7d  (started 9/7), continue, hx PCN all but tolerated Keflex  in the past -- completed 05/29/24  17.  Intermittent Right foot pain , exam normal with exception of reduced pulses will check ABIs due to hx vascular occlusion, on Xarelto  no venous dopplers indicated at this time  -9/11-12 ABI's demonstrate moderate arterial disease on right and mild on the left. Observe for now. I don't see that right foot is impacting therapy right now. Foot is warm to touch  Can f/u with cardiology on this  LOS: 25 days A FACE TO FACE EVALUATION WAS PERFORMED  Prentice FORBES Compton 06/03/2024, 8:01 AM

## 2024-06-03 NOTE — Progress Notes (Signed)
 Physical Therapy Weekly Progress Note  Patient Details  Name: [name] MRN: [id number] Date of Birth: 10/02/1965  Beginning of progress report period: May 28, 2024 End of progress report period: June 03, 2024  Patient has met 1 of 3 short term goals. Pt is making slow progress towards LTG's and will need to have LTG's adjusted to reflect pt's LOA at d/c. Pt currently performs bed mobility with minA (to R side of bed with HOB railing use). WC mobility introduced this week with pt's loaner WC (WC evaluation completed this week) with pt requiring max cuing and maxA to coordinate turning R (decreased ability to produce required force to turn WC to R, but can do so to the L). Pt ambulates 69' but with modA and R HW and has improved in ambulatory capabilities. Pt daughter has participated in family education with understanding of pt's CLOF. Pt has been provided with personal AFO this week.   Patient continues to demonstrate the following deficits muscle weakness, decreased cardiorespiratoy endurance, impaired timing and sequencing, abnormal tone, unbalanced muscle activation, motor apraxia, decreased coordination, and decreased motor planning, decreased midline orientation, left side neglect, and decreased motor planning, decreased initiation, decreased attention, decreased awareness, decreased problem solving, decreased safety awareness, decreased memory, and delayed processing, and decreased sitting balance, decreased standing balance, decreased postural control, hemiplegia, and decreased balance strategies and therefore will continue to benefit from skilled PT intervention to increase functional independence with mobility.  Patient progressing toward long term goals..  Plan of care revisions: transfer and ambulation goals adjusted for progress from CLOF and LTG added for w/c mobility.  PT Short Term Goals Week 3:  PT Short Term Goals - Week 3 PT Short Term Goal 1 (Week 3): Pt will  complete bed<>chair transfers wtih minA and LRAD PT Short Term Goal 1 - Progress (Week 3): Met PT Short Term Goal 2 (Week 3): Pt will propel her wheelchair 57ft with minA PT Short Term Goal 2 - Progress (Week 3): Progressing toward goal PT Short Term Goal 3 (Week 3): Pt will ambulate 44ft with modA and LRAD PT Short Term Goal 3 - Progress (Week 3): Not met PT Short Term Goals - Week 4 PT Short Term Goal 1 (Week 4): STG = LTG d/t ELOS  Skilled Therapeutic Interventions/Progress Updates:  Ambulation/gait training;Community reintegration;Neuromuscular re-education;Stair training;UE/LE Strength taining/ROM;UE/LE Coordination activities;Therapeutic Activities;Discharge planning;Functional mobility training;Splinting/orthotics;Patient/family education;Therapeutic Exercise;Balance/vestibular training;Cognitive remediation/compensation;Pain management;Disease management/prevention;Wheelchair propulsion/positioning;Visual/perceptual remediation/compensation;Skin care/wound management;Psychosocial support;DME/adaptive Administrator, arts;Functional electrical stimulation   Therapy Documentation Precautions:  Precautions Precautions: Fall Recall of Precautions/Restrictions: Impaired Restrictions Weight Bearing Restrictions Per Provider Order: No  Dominic A Sandoval PTA Mliss Milliner PT, DPT, CSRS 06/03/2024, 12:44 PM

## 2024-06-03 NOTE — Progress Notes (Signed)
 Physical Therapy Session Note  Patient Details  Name: [name] MRN: [id number] Date of Birth: 1966-05-02  Today's Date: 06/03/2024 PT Individual Time: 8996-8885; 1423 - 1532 PT Individual Time Calculation (min): 71 min; 69 min   Short Term Goals: Week 3:  PT Short Term Goal 1 (Week 3): Pt will complete bed<>chair transfers wtih minA and LRAD PT Short Term Goal 2 (Week 3): Pt will propel her wheelchair 58ft with minA PT Short Term Goal 3 (Week 3): Pt will ambulate 18ft with modA and LRAD  SESSION 1 Skilled Therapeutic Interventions/Progress Updates: Patient sitting in WC on entrance to room. Patient alert and agreeable to PT session.   Patient reported no pain during session  Therapeutic Activity: Transfers: Pt performed sit<>stand pivot transfer from WC<>edge of mat with first trial to assess new L AFO with [facility] rep present. Pt required overall modA that progressed closer to heavy modA due to pt's R LE starting to increase in push to. VC for weight shifting to R to advance L LE, as well as cues to increase step length + abduction to increase slight lateral step placement. Pt with excessive external rotation with inability to internally rotate to neutral placement. Pt then performed same transfer to L with 5lb ankle weight donned L LE with cues to step roughly 45* to L towards WC , and then back for few reps (same cues required for weight shift). Pt with improved step placement, but still present with difficulty in internally rotating L LE.   Wheelchair Mobility:  Pt performed mass practice of WC mobility in day room. Pt required questioning cues due to pt unlocking R wheel, but forgetting to unlock L and was driving around in a circle. PTA gave pt some time to see if she could problem solve but PTA needed to intervene with pt requiring multiple questions before arriving to needing to unlock L wheel. Pt propelled WC around table with difficulty to turn WC to the R, but easier to do  so to L. Pt max cues to laterally step R LE to drive wheel to R, but pt unable to do so. Pt also required max cuing to increase awareness to avoid bumping into wall or tables. PTA introduced yellow theraband to provide tactile feedback to R LE to obtain adequate ROM to turn WC to R. Pt progressed to red theraband with added instructions to step onto disc and slide medially and laterally while PTA pulling to the L into adduction and flexion to promote hip abductor/hip extensor engage to achieve the pressure needed to drive WC. Pt performed mass practice for 2 rounds with rest break provided. Pt continued to present with difficulty with acquiring enough force of R hip extension + knee flexion to turn WC to R.   Patient sitting in WC at end of session with brakes locked, belt alarm set, and all needs within reach.  SESSION 2 Skilled Therapeutic Interventions/Progress Updates: Patient sitting in WC on entrance to room. Patient alert and agreeable to PT session.   Patient reported no pain during session.   Therapeutic Activity: Bed Mobility: Pt performed sit<supine from EOB with min/heavy minA at end of session due to fatigue. VC required for sequence to sidelying. Transfers: Pt performed sit<>stand transfers throughout session with R HW and overall modA to the R. Provided VC for sequence and HW management and to increase retro-step clearance/length on L LE. Pt required heavy modA to the L with HW on R due to pt's report  of having difficulty in motor planning L LE advancement. Pt with closer to heavy modA to the R at end of session due to fatigue.  Gait Training:  Pt ambulated 16' x 1, 32' x 1 using R HW with overall modA. Pt with L AFO donned with toe cap. Pt demonstrated the following gait deviations with therapist providing the described cuing and facilitation for improvement:  - VC to increase step length on R LE, and to increase clearance on L - VC to maintain safe management of HW (pt would tend to  leave it behind). - multimodal cues to weight shift appropriately.  Neuromuscular Re-ed: NMR facilitated during session with focus on proprioceptive feedback, weight shift. - Pt laterally stepping with L LE and 5lb ankle weight donned. Pt with very minimal advancement without maxA to shift weight to R, and to step back. Pt with R UE support on HW. Pt required increased time to motor plan required sequence  NMR performed for improvements in motor control and coordination, balance, sequencing, judgement, and self confidence/ efficacy in performing all aspects of mobility at highest level of independence.   Patient supine in bed at end of session with brakes locked, nsg present, bed alarm set, and all needs within reach.       Therapy Documentation Precautions:  Precautions Precautions: Fall Recall of Precautions/Restrictions: Impaired Restrictions Weight Bearing Restrictions Per Provider Order: No   Therapy/Group: Individual Therapy  Antanisha Mohs PTA 06/03/2024, 12:43 PM

## 2024-06-04 DIAGNOSIS — I10 Essential (primary) hypertension: Secondary | ICD-10-CM | POA: Diagnosis not present

## 2024-06-04 DIAGNOSIS — K5901 Slow transit constipation: Secondary | ICD-10-CM | POA: Diagnosis not present

## 2024-06-04 DIAGNOSIS — I63521 Cerebral infarction due to unspecified occlusion or stenosis of right anterior cerebral artery: Secondary | ICD-10-CM | POA: Diagnosis not present

## 2024-06-04 NOTE — Progress Notes (Signed)
 PROGRESS NOTE   Subjective/Complaints:  Pt doing well, apparently had a fall this AM but no injuries, was lowered to ground by family. Slept well. Denies pain. LBM 3d ago which is normal for her, would like to see if she has a BM today. Urinating fine. No other complaints or concerns.    ROS: as per HPI. Denies CP, SOB, abd pain, N/V/D, or any other complaints at this time.    Objective:   No results found.  No results for input(s): WBC, HGB, HCT, PLT in the last 72 hours.     Recent Labs    06/02/24 0548 06/03/24 0510  K 3.6 3.6       Intake/Output Summary (Last 24 hours) at 06/04/2024 1222 Last data filed at 06/04/2024 0803 Gross per 24 hour  Intake 220 ml  Output --  Net 220 ml        Physical Exam: Vital Signs Blood pressure (!) 108/59, pulse 74, temperature 98.6 F (37 C), temperature source Oral, resp. rate 19, height 5' 3.5 (1.613 m), weight 85.6 kg, SpO2 99%.   General: No acute distress, resting comfortably in bed.  Mood and affect are appropriate Heart: reg rate and ?irreg rhythm no rubs murmurs or extra sounds Lungs: Clear to auscultation, breathing unlabored, no rales or wheezes Abdomen: Positive bowel sounds, soft nontender to palpation, nondistended Extremities: No clubbing, cyanosis, or edema Skin: No evidence of breakdown, no evidence of rash over exposed surfaces Neuro: R gaze preference, slightly dysarthric and delayed, follows commands.   PRIOR EXAMS: Neuro:   alert today.  Right gaze preference ongoing but can be cued to left verbally. Follows basic commands.  speech sl dysarthric, left central VII. MMT:LUE 0/5. trace Left elbow flexion and Hip Ext LLE 2- with knee ext synergy pattern  . Moves right side freely. Senses pain and light touch in all 4's. LUE DTR's 1+. LLE DTR's brisk 3+ with early extensor tone, toes up.  left pectoralis and elbow flexor tone MAS 1-2      Musculoskeletal: no pain with AROM/PROM  No swelling LLE, no hamstrings or adductor pain. No pain with ROM  Assessment/Plan: 1. Functional deficits which require 3+ hours per day of interdisciplinary therapy in a comprehensive inpatient rehab setting. Physiatrist is providing close team supervision and 24 hour management of active medical problems listed below. Physiatrist and rehab team continue to assess barriers to discharge/monitor patient progress toward functional and medical goals  Care Tool:  Bathing    Body parts bathed by patient: Abdomen, Front perineal area, Left arm, Face, Chest, Right upper leg, Left upper leg, Left lower leg, Right lower leg, Right arm   Body parts bathed by helper: Buttocks     Bathing assist Assist Level: Minimal Assistance - Patient > 75%     Upper Body Dressing/Undressing Upper body dressing   What is the patient wearing?: Pull over shirt    Upper body assist Assist Level: Moderate Assistance - Patient 50 - 74%    Lower Body Dressing/Undressing Lower body dressing      What is the patient wearing?: Incontinence brief, Pants     Lower body assist Assist for lower body  dressing: Maximal Assistance - Patient 25 - 49%     Toileting Toileting    Toileting assist Assist for toileting: Maximal Assistance - Patient 25 - 49% (used stedy)     Transfers Chair/bed transfer  Transfers assist  Chair/bed transfer activity did not occur: Safety/medical concerns  Chair/bed transfer assist level: Moderate Assistance - Patient 50 - 74% (to the R with HW)     Locomotion Ambulation   Ambulation assist   Ambulation activity did not occur: Safety/medical concerns  Assist level: Moderate Assistance - Patient 50 - 74% Assistive device: Walker-hemi Max distance: 60ft   Walk 10 feet activity   Assist  Walk 10 feet activity did not occur: Safety/medical concerns  Assist level: Moderate Assistance - Patient - 50 - 74% Assistive device:  Walker-hemi   Walk 50 feet activity   Assist Walk 50 feet with 2 turns activity did not occur: Safety/medical concerns         Walk 150 feet activity   Assist Walk 150 feet activity did not occur: Safety/medical concerns         Walk 10 feet on uneven surface  activity   Assist Walk 10 feet on uneven surfaces activity did not occur: Safety/medical concerns         Wheelchair     Assist Is the patient using a wheelchair?: Yes Type of Wheelchair: Manual    Wheelchair assist level: Moderate Assistance - Patient 50 - 74% Max wheelchair distance: 50    Wheelchair 50 feet with 2 turns activity    Assist        Assist Level: Moderate Assistance - Patient 50 - 74%   Wheelchair 150 feet activity     Assist      Assist Level: Dependent - Patient 0%   Blood pressure (!) 108/59, pulse 74, temperature 98.6 F (37 C), temperature source Oral, resp. rate 19, height 5' 3.5 (1.613 m), weight 85.6 kg, SpO2 99%.  Medical Problem List and Plan: 1. 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 Pt is s/p IR w TICI2b. 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  Antithrombotics: -DVT/anticoagulation:  Pharmaceutical: Xarelto  20mg  daily             -antiplatelet therapy: N/A 3. Pain Management: Tylenol  prn for pain              --Has been on topamax  for HA in the past.  9/12 pain appears controlled 4. Mood/Behavior/Sleep: LCSW to follow for evaluaton and support.              --trazodone  prn for insomnia.              -antipsychotic agents: N/A 5. Neuropsych/cognition: This patient is capable of making decisions on her own behalf.  Hold amantadine  100mg  BID monitor for worsening lethargy     6. Skin/Wound Care: Routine pressure relief  measures.  7. Fluids/Electrolytes/Nutrition: Monitor I/O. Add CM restrictions to diet             -Post stroke Dysphagia --SLP following. D2 diet/thins  hypoK+ now on daily KCL   Low K+ 1x dose KCL, on aldactone , creat stable , BUN normal     Latest Ref Rng & Units 06/03/2024    5:10 AM 06/02/2024    5:48 AM 05/30/2024    5:43 AM  BMP  Glucose 70 - 99 mg/dL   98   BUN 6 - 20 mg/dL   7   Creatinine 9.55 - 1.00 mg/dL   8.71   Sodium 864 - 854 mmol/L   143   Potassium 3.5 - 5.1 mmol/L 3.6  3.6  3.4   Chloride 98 - 111 mmol/L   110   CO2 22 - 32 mmol/L   18   Calcium  8.9 - 10.3 mg/dL   9.7    8. A fib: Monitor HR TID-- Metoprolol  succinate 200 mg daily  --Continue Xarelto .  9. HTN: Monitor BP TID. On metoprolol  and aldactone  25mg  daily  -BP stable, monitor  Reduced aldactone  to 12.5mg  every day first dose 9/19 -06/04/24 BP mostly stable but slightly lower this morning, monitor with change in med.  Vitals:   06/01/24 1951 06/02/24 0348 06/02/24 1534 06/02/24 1935  BP: 102/78 109/74 108/89 105/74   06/03/24 0319 06/03/24 0801 06/03/24 1245 06/03/24 1937  BP: 116/83 102/65 101/75 (!) 108/95   06/04/24 0548 06/04/24 0914 06/04/24 0955 06/04/24 1109  BP: 121/76 136/80 136/80 (!) 108/59    10. NICM w/HFrEF:  Cardiology Dr. Francyne. Digoxin  and metoprolol  tartrate was stooped. Entresto  not continued. She is continued on spironolactone  and Metoprolol  succinate 200mg  daily in the AM. Trying to keep regimen simple. Appreciate the follow up  11.  Pre-diabetes: had been On Farxiga . Added CM restrictions to diet.  --Monitor BS ac/hs and use SSI tor elevated BS.  Controlled 9/5 only daily checks -9/6-7/25 CBGs fine, could consider d/c checks if continued stability  -9/11 well controlled--dc'd cbg checks    12. HLD- LDL 109, crestor  40 mg daily  13. Obesity:  BMI 34.84 educate on diet and weight loss to promote overall health and mobility.  14. Leukocytosis: no s/s infection,  resolved on abx 9/8    Latest Ref Rng & Units 05/30/2024    5:43 AM 05/26/2024    4:28 AM 05/23/2024    5:10 AM  CBC  WBC 4.0 - 10.5 K/uL 8.6  8.8  9.7   Hemoglobin 12.0 - 15.0 g/dL 85.6  85.9  86.0   Hematocrit 36.0 - 46.0 % 45.2  43.4  43.7   Platelets 150 - 400 K/uL 158  178  200      15. Slow transit constipation:   -miralax  scheduled daily with prns (refused 9/12) -06/04/24 no BM in 3 days, refusing senokot 9/19 but used miralax  9/20, monitor today but if no BM tomorrow, might need sorbitol .   16. Cloudy malodorous urine per nursing 9/6, nursing also reported frequency/urge to urinate after emptying-- U/A shows WBC but no bact, UCx showing >100K e coli, sensitive to Keflex  500mg  TID x7d (started 9/7), continue, hx PCN all but tolerated Keflex  in the past -- completed 05/29/24  17.  Intermittent Right foot pain , exam normal with exception of reduced pulses will check ABIs due to hx vascular occlusion, on Xarelto  no venous dopplers indicated at this time  -9/11-12 ABI's demonstrate moderate arterial disease on right and mild on the left. Observe for now. I don't see that right foot is impacting therapy right now. Foot is warm to touch  Can f/u with cardiology on  this   LOS: 26 days A FACE TO FACE EVALUATION WAS PERFORMED  [address] 06/04/2024, 12:22 PM

## 2024-06-04 NOTE — Progress Notes (Signed)
 06/04/24 0914  What Happened  Was fall witnessed? Yes  Who witnessed fall? Family (Latoya & Koraima Albertsen)  Patients activity before fall to/from bed, chair, or stretcher  Point of contact buttocks  Was patient injured? No  Provider Notification  Provider Name/Title Dr Murray  Date Provider Notified 06/04/24  Time Provider Notified [phone]  Method of Notification Call  Notification Reason Fall  Provider response No new orders  Date of Provider Response 06/04/24  Time of Provider Response [phone]  Follow Up  Family notified Yes - comment (In room)  Time family notified 0914  Additional tests No  Simple treatment Other (comment) (Repositioned)  Progress note created (see row info) Yes  Adult Fall Risk Assessment  Risk Factor Category (scoring not indicated) Fall has occurred during this admission (document High fall risk)  Age 58  Fall History: Fall within 6 months prior to admission 0  Elimination; Bowel and/or Urine Incontinence 2  Elimination; Bowel and/or Urine Urgency/Frequency 2  Medications: includes PCA/Opiates, Anti-convulsants, Anti-hypertensives, Diuretics, Hypnotics, Laxatives, Sedatives, and Psychotropics 5  Patient Care Equipment 2  Mobility-Assistance 2  Mobility-Gait 2  Mobility-Sensory Deficit 0  Altered awareness of immediate physical environment 0  Impulsiveness 0  Lack of understanding of one's physical/cognitive limitations 0  Total Score 16  Patient Fall Risk Level High fall risk  Adult Fall Risk Interventions  Required Bundle Interventions *See Row Information* High fall risk - low, moderate, and high requirements implemented  Additional Interventions PT/OT need assessed if change in mobility from baseline;Use of appropriate toileting equipment (bedpan, BSC, etc.);Family Supervision  Fall intervention(s) refused/Patient educated regarding refusal Bed alarm;Nonskid socks;Yellow bracelet  Screening for Fall Injury Risk (To be completed on HIGH fall risk  patients) - Assessing Need for Floor Mats  Risk For Fall Injury- Criteria for Floor Mats Previous fall this admission  Will Implement Floor Mats Yes  Vitals  Temp 98.1 F (36.7 C)  Temp Source Oral  BP 136/80  MAP (mmHg) 98  BP Location Left Arm  BP Method Automatic  Patient Position (if appropriate) Sitting  Pulse Rate 100  Pulse Rate Source Dinamap  Resp 20  Oxygen Therapy  SpO2 100 %  O2 Device Room Air  Pain Assessment  Pain Scale 0-10  Pain Score 0  PCA/Epidural/Spinal Assessment  Respiratory Pattern Regular  Neurological  Neuro (WDL) X  Level of Consciousness Alert  Orientation Level Oriented X4  Cognition Appropriate attention/concentration;Appropriate judgement;Appropriate safety awareness;Follows commands  Speech Delayed responses  Motor Function/Sensation Assessment Grip;Motor response;Motor strength  R Hand Grip Moderate  L Hand Grip Weak   R Foot Dorsiflexion Moderate  L Foot Dorsiflexion Weak  R Foot Plantar Flexion Moderate  L Foot Plantar Flexion Weak  RUE Motor Response Purposeful movement;Responds to commands  RUE Sensation Full sensation  RUE Motor Strength 4  LUE Motor Response Purposeful movement;Responds to commands;Non-purposeful movement  LUE Sensation Decreased  LUE Motor Strength 2  RLE Motor Response Purposeful movement;Responds to commands  RLE Sensation Full sensation  RLE Motor Strength 4  LLE Motor Response Non-purposeful movement  LLE Sensation Decreased  LLE Motor Strength 1  Neuro Symptoms None  Neuro symptoms relieved by Rest  Neuro Additional Assessments Glasgow Coma Scale  Glasgow Coma Scale  Eye Opening 4  Best Verbal Response (NON-intubated) 5  Best Motor Response 6  Glasgow Coma Scale Score 15  Musculoskeletal  Musculoskeletal (WDL) X  Assistive Device Steptoe;Wheelchair;Sliding board  Generalized Weakness Yes  Weight Bearing Restrictions Per Provider Order No  Musculoskeletal Details  RUE Weakness  LUE Limited  movement;Weakness  RLE Weakness  LLE Limited movement;Weakness  Integumentary  Integumentary (WDL) WDL  Skin Color Appropriate for ethnicity  Skin Condition Dry  Skin Integrity Intact  Skin Turgor Non-tenting

## 2024-06-04 NOTE — Plan of Care (Signed)
  Problem: RH Stairs Goal: LTG Patient will ambulate up and down stairs w/assist (PT) Description: LTG: Patient will ambulate up and down # of stairs with assistance (PT) Outcome: Not Applicable Note: Pt unable to progress gait training to include stair training. Will not be a functional ambulator upon discharge.    Problem: RH Balance Goal: LTG Patient will maintain dynamic standing balance (PT) Description: LTG:  Patient will maintain dynamic standing balance with assistance during mobility activities (PT) Flowsheets (Taken 06/04/2024 1310) LTG: Pt will maintain dynamic standing balance during mobility activities with:: Moderate Assistance - Patient 50 - 74%   Problem: RH Car Transfers Goal: LTG Patient will perform car transfers with assist (PT) Description: LTG: Patient will perform car transfers with assistance (PT). Flowsheets (Taken 06/04/2024 1310) LTG: Pt will perform car transfers with assist:: Maximal Assistance - Patient 25 - 49%   Problem: RH Ambulation Goal: LTG Patient will ambulate in controlled environment (PT) Description: LTG: Patient will ambulate in a controlled environment, # of feet with assistance (PT). Flowsheets (Taken 06/04/2024 1310) LTG: Pt will ambulate in controlled environ  assist needed:: Moderate Assistance - Patient 50 - 74% LTG: Ambulation distance in controlled environment: at least 30 ft using LRAD   Problem: RH Wheelchair Mobility Goal: LTG Patient will propel w/c in home environment (PT) Description: LTG: Patient will propel wheelchair in home environment, # of feet with assistance (PT). Flowsheets (Taken 06/04/2024 1313) LTG: Pt will propel w/c in home environ  assist needed:: Moderate Assistance - Patient 50 - 74% Distance: wheelchair distance in controlled environment: 50 LTG: Propel w/c distance in home environment: up to 50 ft

## 2024-06-04 NOTE — Progress Notes (Signed)
 Physical Therapy Discharge Summary  Patient Details  Name: [name] MRN: [id number] Date of Birth: [birth year]  Date of Discharge from PT service:{Time; dates multiple:304500300}  Patient has met {NUMBERS 0-12:18577} of {NUMBERS 0-12:18577} long term goals due to {due un:6958322}.  Patient to discharge at [facility] level {LOA:3049010}.   Patient's care partner {care partner:3041650} to provide the necessary {assistance:3041652} assistance at discharge.  Reasons goals not met: ***  Recommendation:  Patient will benefit from ongoing skilled PT services in {setting:3041680} to continue to advance safe functional mobility, address ongoing impairments in ***, and minimize fall risk.  Equipment: {equipment:3041657}  Reasons for discharge: {Reason for discharge:3049018}  Patient/family agrees with progress made and goals achieved: {Pt/Family agree with progress/goals:3049020}  PT Discharge Precautions/Restrictions Precautions Precautions: Fall Restrictions Weight Bearing Restrictions Per Provider Order: No Vital Signs Therapy Vitals Temp: 98 F (36.7 C) Temp Source: Oral Pulse Rate: 76 Resp: 18 BP: 121/76 Patient Position (if appropriate): Lying Oxygen Therapy SpO2: 100 % O2 Device: Room Air Pain Pain Assessment Pain Scale: 0-10 Pain Score: 0-No pain Pain Interference Pain Interference Pain Effect on Sleep: 1. Rarely or not at all Pain Interference with Therapy Activities: 1. Rarely or not at all Pain Interference with Day-to-Day Activities: 1. Rarely or not at all Vision/Perception    *** Cognition Overall Cognitive Status: Impaired/Different from baseline Arousal/Alertness: Awake/alert Orientation Level: Oriented X4 Attention: Sustained;Focused Focused Attention: Appears intact Sustained Attention: Impaired Memory: Impaired Memory Impairment: Decreased recall of new information Awareness: Impaired Awareness Impairment: Emergent impairment;Anticipatory  impairment Safety/Judgment: Impaired Sensation  *** Motor  Motor Motor: Abnormal tone;Other (comment) (Hemiparesis) Motor - Discharge Observations: Decreased motor planning especially when fatigued  Mobility Bed Mobility Bed Mobility: Rolling Right;Rolling Left;Supine to Sit;Sit to Supine Rolling Right: Minimal Assistance - Patient > 75% Rolling Left: Minimal Assistance - Patient > 75% Supine to Sit: Moderate Assistance - Patient 50-74%;Minimal Assistance - Patient > 75% Sit to Supine: Moderate Assistance - Patient 50-74%;Minimal Assistance - Patient > 75% Transfers Transfers: Facilities manager Stand Pivot Transfers: Moderate Assistance - Patient 50 - 74% (minA to the R, modA to the L) Stand Pivot Transfer Details: Verbal cues for safe use of DME/AE;Verbal cues for precautions/safety;Verbal cues for technique;Manual facilitation for weight shifting Transfer (Assistive device): Hemi-walker Locomotion    *** Trunk/Postural Assessment  Cervical Assessment Cervical Assessment: Exceptions to [facility] (forward head) Thoracic Assessment Thoracic Assessment: Exceptions to [facility] (rounded shoulders) Lumbar Assessment Lumbar Assessment: Exceptions to [facility] (posterior pelvic tilt) Postural Control Postural Control: Deficits on evaluation Trunk Control: push to L has improved since evaluation Righting Reactions: decreased Protective Responses: decreased  Balance Balance Balance Assessed: Yes Static Sitting Balance Static Sitting - Level of Assistance: 5: Stand by assistance Dynamic Sitting Balance Dynamic Sitting - Level of Assistance: 5: Stand by assistance Static Standing Balance Static Standing - Level of Assistance: 5: Stand by assistance Dynamic Standing Balance Dynamic Standing - Level of Assistance: 4: Min assist Extremity Assessment  RLE Assessment RLE Assessment: Exceptions to [facility] General Strength Comments: Grossly 4-/5 LLE Assessment LLE Assessment: Exceptions to  [facility] LLE Strength Left Hip Flexion: 2+/5 Left Hip Extension: 2+/5 Left Hip ABduction: 2-/5 Left Hip ADduction: 2+/5 Left Knee Flexion: 2-/5 Left Knee Extension: 3-/5 Left Ankle Dorsiflexion: 0/5 Left Ankle Plantar Flexion: 2-/5   Elber Galyean PTA   06/04/2024, 8:05 AM

## 2024-06-04 NOTE — Progress Notes (Signed)
 Physical Therapy Session Note  Patient Details  Name: [name] MRN: [id number] Date of Birth: 17-May-1966  Today's Date: 06/04/2024 PT Individual Time: 8593-8554 PT Individual Time Calculation (min): 39 min   Short Term Goals: Week 3:  PT Short Term Goal 1 (Week 3): Pt will complete bed<>chair transfers wtih minA and LRAD PT Short Term Goal 1 - Progress (Week 3): Met PT Short Term Goal 2 (Week 3): Pt will propel her wheelchair 17ft with minA PT Short Term Goal 2 - Progress (Week 3): Progressing toward goal PT Short Term Goal 3 (Week 3): Pt will ambulate 10ft with modA and LRAD PT Short Term Goal 3 - Progress (Week 3): Not met  Skilled Therapeutic Interventions/Progress Updates: Patient supine in bed on entrance to room. Patient alert and agreeable to PT session.   Patient reported no pain. Pt's nsg stated pt had fall earlier in morning with family assisting to bathroom due to having to wait on assistance. Pt did no have grip socks or shoes on and slide out of STEDY per nsg report. PTA instructed pt to communicate with family that pt needs to have grip socks or shoes on at all times when OOB. D/C note shared for grad day PT  Therapeutic Activity: Bed Mobility: Pt performed supine<>sit on EOB with modA due to pt not being out of bed today with use of HOB railing and VC for sequence to use R UE to assist with truncal elevation and pt use of R LE to assist with L LE advancement off of bed. Transfers: Pt performed sit<>stand transfer from EOB<>WC with HW and heavy modA to the L, and heavy minA to the R this session. Provided VC for step placement and pt required manual facilitation of weight shift.  Wheelchair Mobility:  Pt propelled wheelchair 50' in day room gym with minA with turns to the L, and closer to mod/maxA to turn to the R due to inability to produce enough forced on heel to ground to assist with turn. Pt also with decreased awareness that required max cuing to avoid running into  objects. Pt with R hemi-technique.   Neuromuscular Re-ed: NMR facilitated during session with focus on neuromuscular control/connection to L LE. - Supine SLR AROM with L LE and pt being 2+/5 with R knee in flexion and pt cued to control descent until close to fatigue. Pt then performed hip + knee extension with PTA providing min resistance supine in bed until close to fatigue.  NMR performed for improvements in motor control and coordination, balance, sequencing, judgement, and self confidence/ efficacy in performing all aspects of mobility at highest level of independence.   Patient supine in bed at end of session with brakes locked, bed alarm set, and all needs within reach.  Ambulation/gait training;Community reintegration;Neuromuscular re-education;Stair training;UE/LE Strength taining/ROM;UE/LE Coordination activities;Therapeutic Activities;Discharge planning;Functional mobility training;Splinting/orthotics;Patient/family education;Therapeutic Exercise;Balance/vestibular training;Cognitive remediation/compensation;Pain management;Disease management/prevention;Wheelchair propulsion/positioning;Visual/perceptual remediation/compensation;Skin care/wound management;Psychosocial support;DME/adaptive Administrator, arts;Functional electrical stimulation   Therapy Documentation Precautions:  Precautions Precautions: Fall Recall of Precautions/Restrictions: Impaired Restrictions Weight Bearing Restrictions Per Provider Order: No  Therapy/Group: Individual Therapy  Britlyn Martine PTA 06/04/2024, 3:01 PM

## 2024-06-05 DIAGNOSIS — I2510 Atherosclerotic heart disease of native coronary artery without angina pectoris: Secondary | ICD-10-CM | POA: Diagnosis not present

## 2024-06-05 DIAGNOSIS — I10 Essential (primary) hypertension: Secondary | ICD-10-CM | POA: Diagnosis not present

## 2024-06-05 DIAGNOSIS — E139 Other specified diabetes mellitus without complications: Secondary | ICD-10-CM | POA: Diagnosis not present

## 2024-06-05 DIAGNOSIS — I63521 Cerebral infarction due to unspecified occlusion or stenosis of right anterior cerebral artery: Secondary | ICD-10-CM | POA: Diagnosis not present

## 2024-06-05 DIAGNOSIS — K5901 Slow transit constipation: Secondary | ICD-10-CM | POA: Diagnosis not present

## 2024-06-05 DIAGNOSIS — M549 Dorsalgia, unspecified: Secondary | ICD-10-CM | POA: Diagnosis not present

## 2024-06-05 DIAGNOSIS — I5042 Chronic combined systolic (congestive) and diastolic (congestive) heart failure: Secondary | ICD-10-CM | POA: Diagnosis not present

## 2024-06-05 NOTE — Progress Notes (Signed)
 PROGRESS NOTE   Subjective/Complaints:  Pt doing well again today, Slept well. Pain well managed. LBM a couple days ago so willing to take sorbitol  and took miralax  but doesn't want anything additional. Urinating fine. No other complaints or concerns.    ROS: as per HPI. Denies CP, SOB, abd pain, N/V/D, or any other complaints at this time.    Objective:   No results found.  No results for input(s): WBC, HGB, HCT, PLT in the last 72 hours.     Recent Labs    06/03/24 0510  K 3.6       Intake/Output Summary (Last 24 hours) at 06/05/2024 1245 Last data filed at 06/04/2024 1333 Gross per 24 hour  Intake 5 ml  Output --  Net 5 ml        Physical Exam: Vital Signs Blood pressure 135/77, pulse 74, temperature 98 F (36.7 C), temperature source Oral, resp. rate 16, height 5' 3.5 (1.613 m), weight 85.6 kg, SpO2 96%.   General: No acute distress, resting comfortably in bed.  Mood and affect are appropriate Heart: reg rate and ?irreg rhythm no rubs murmurs or extra sounds Lungs: Clear to auscultation, breathing unlabored, no rales or wheezes Abdomen: Positive bowel sounds though hypoactive, soft nontender to palpation, nondistended Extremities: No clubbing, cyanosis, or edema Skin: No evidence of breakdown, no evidence of rash over exposed surfaces Neuro: R gaze preference, slightly dysarthric and delayed, follows commands.   PRIOR EXAMS: Neuro:   alert today.  Right gaze preference ongoing but can be cued to left verbally. Follows basic commands.  speech sl dysarthric, left central VII. MMT:LUE 0/5. trace Left elbow flexion and Hip Ext LLE 2- with knee ext synergy pattern  . Moves right side freely. Senses pain and light touch in all 4's. LUE DTR's 1+. LLE DTR's brisk 3+ with early extensor tone, toes up.  left pectoralis and elbow flexor tone MAS 1-2     Musculoskeletal: no pain with AROM/PROM  No  swelling LLE, no hamstrings or adductor pain. No pain with ROM  Assessment/Plan: 1. Functional deficits which require 3+ hours per day of interdisciplinary therapy in a comprehensive inpatient rehab setting. Physiatrist is providing close team supervision and 24 hour management of active medical problems listed below. Physiatrist and rehab team continue to assess barriers to discharge/monitor patient progress toward functional and medical goals  Care Tool:  Bathing    Body parts bathed by patient: Abdomen, Front perineal area, Left arm, Face, Chest, Right upper leg, Left upper leg, Left lower leg, Right lower leg, Right arm   Body parts bathed by helper: Buttocks     Bathing assist Assist Level: Minimal Assistance - Patient > 75%     Upper Body Dressing/Undressing Upper body dressing   What is the patient wearing?: Pull over shirt    Upper body assist Assist Level: Moderate Assistance - Patient 50 - 74%    Lower Body Dressing/Undressing Lower body dressing      What is the patient wearing?: Incontinence brief, Pants     Lower body assist Assist for lower body dressing: Maximal Assistance - Patient 25 - 49%     Toileting Toileting  Toileting assist Assist for toileting: Maximal Assistance - Patient 25 - 49% (used stedy)     Transfers Chair/bed transfer  Transfers assist  Chair/bed transfer activity did not occur: Safety/medical concerns  Chair/bed transfer assist level: Moderate Assistance - Patient 50 - 74% (minA to the R, modA to the L)     Locomotion Ambulation   Ambulation assist   Ambulation activity did not occur: Safety/medical concerns  Assist level: Moderate Assistance - Patient 50 - 74% Assistive device: Walker-hemi Max distance: 41ft   Walk 10 feet activity   Assist  Walk 10 feet activity did not occur: Safety/medical concerns  Assist level: Moderate Assistance - Patient - 50 - 74% Assistive device: Walker-hemi   Walk 50 feet  activity   Assist Walk 50 feet with 2 turns activity did not occur: Safety/medical concerns         Walk 150 feet activity   Assist Walk 150 feet activity did not occur: Safety/medical concerns         Walk 10 feet on uneven surface  activity   Assist Walk 10 feet on uneven surfaces activity did not occur: Safety/medical concerns (decreased safety awareness)         Wheelchair     Assist Is the patient using a wheelchair?: Yes Type of Wheelchair: Manual    Wheelchair assist level: Moderate Assistance - Patient 50 - 74% Max wheelchair distance: 50    Wheelchair 50 feet with 2 turns activity    Assist        Assist Level: Moderate Assistance - Patient 50 - 74%   Wheelchair 150 feet activity     Assist  Wheelchair 150 feet activity did not occur: Safety/medical concerns (decreased safety awareness)   Assist Level: Dependent - Patient 0%   Blood pressure 135/77, pulse 74, temperature 98 F (36.7 C), temperature source Oral, resp. rate 16, height 5' 3.5 (1.613 m), weight 85.6 kg, SpO2 96%.  Medical Problem List and Plan: 1. 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 Pt is s/p IR w TICI2b. 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  Antithrombotics: -DVT/anticoagulation:  Pharmaceutical: Xarelto  20mg  daily             -antiplatelet therapy: N/A 3. Pain Management: Tylenol  prn for pain              --Has been on topamax  for HA in the past.  9/12 pain appears controlled 4. Mood/Behavior/Sleep: LCSW to follow for evaluaton and support.              --trazodone  prn for insomnia.              -antipsychotic agents: N/A 5. Neuropsych/cognition: This patient is capable of making decisions on her own behalf.  Hold amantadine   100mg  BID monitor for worsening lethargy     6. Skin/Wound Care: Routine pressure relief measures.  7. Fluids/Electrolytes/Nutrition: Monitor I/O. Add CM restrictions to diet             -Post stroke Dysphagia --SLP following. D2 diet/thins  hypoK+ now on daily KCL   Low K+ 1x dose KCL, on aldactone , creat stable , BUN normal     Latest Ref Rng & Units 06/03/2024    5:10 AM 06/02/2024    5:48 AM 05/30/2024    5:43 AM  BMP  Glucose 70 - 99 mg/dL   98   BUN 6 - 20 mg/dL   7   Creatinine 9.55 - 1.00 mg/dL   8.71   Sodium 864 - 854 mmol/L   143   Potassium 3.5 - 5.1 mmol/L 3.6  3.6  3.4   Chloride 98 - 111 mmol/L   110   CO2 22 - 32 mmol/L   18   Calcium  8.9 - 10.3 mg/dL   9.7    8. A fib: Monitor HR TID-- Metoprolol  succinate 200 mg daily  --Continue Xarelto .  9. HTN: Monitor BP TID. On metoprolol  and aldactone  25mg  daily  -BP stable, monitor  Reduced aldactone  to 12.5mg  every day first dose 9/19 -06/04/24 BP mostly stable but slightly lower this morning, monitor with change in med.-- improving 9/21 Vitals:   06/02/24 1935 06/03/24 0319 06/03/24 0801 06/03/24 1245  BP: 105/74 116/83 102/65 101/75   06/03/24 1937 06/04/24 0548 06/04/24 0914 06/04/24 0955  BP: (!) 108/95 121/76 136/80 136/80   06/04/24 1109 06/04/24 1604 06/04/24 1954 06/05/24 0525  BP: (!) 108/59 (!) 120/98 105/89 135/77    10. NICM w/HFrEF:  Cardiology Dr. Francyne. Digoxin  and metoprolol  tartrate was stooped. Entresto  not continued. She is continued on spironolactone  and Metoprolol  succinate 200mg  daily in the AM. Trying to keep regimen simple. Appreciate the follow up  11.  Pre-diabetes: had been On Farxiga . Added CM restrictions to diet.  --Monitor BS ac/hs and use SSI tor elevated BS.  Controlled 9/5 only daily checks -9/6-7/25 CBGs fine, could consider d/c checks if continued stability  -9/11 well controlled--dc'd cbg checks    12. HLD- LDL 109, crestor  40 mg daily  13. Obesity:  BMI 34.84  educate on diet and weight loss to promote overall health and mobility.  14. Leukocytosis: no s/s infection, resolved on abx 9/8    Latest Ref Rng & Units 05/30/2024    5:43 AM 05/26/2024    4:28 AM 05/23/2024    5:10 AM  CBC  WBC 4.0 - 10.5 K/uL 8.6  8.8  9.7   Hemoglobin 12.0 - 15.0 g/dL 85.6  85.9  86.0   Hematocrit 36.0 - 46.0 % 45.2  43.4  43.7   Platelets 150 - 400 K/uL 158  178  200      15. Slow transit constipation:   -miralax  scheduled daily with prns (refused 9/12) -06/04/24 no BM in 3 days, refusing senokot 9/19 but used miralax  9/20, monitor today but if no BM tomorrow, might need sorbitol .  -06/05/24 still no BM, took miralax  and sorbitol  today, monitor for movement-- if no BM tomorrow, would try MoM  16. Cloudy malodorous urine per nursing 9/6, nursing also reported frequency/urge to urinate after emptying-- U/A shows WBC but no bact, UCx showing >100K e coli, sensitive to Keflex  500mg  TID x7d (started 9/7), continue, hx PCN all but tolerated Keflex  in the past -- completed 05/29/24  17.  Intermittent Right foot pain , exam normal with exception of reduced pulses will check ABIs due to hx vascular occlusion, on Xarelto  no venous dopplers indicated at this time  -9/11-12 ABI's demonstrate moderate arterial disease on right and mild on the left. Observe for now. I  don't see that right foot is impacting therapy right now. Foot is warm to touch  Can f/u with cardiology on this   LOS: 27 days A FACE TO FACE EVALUATION WAS PERFORMED  [address] 06/05/2024, 12:45 PM

## 2024-06-05 NOTE — Plan of Care (Signed)
  Problem: Consults Goal: RH STROKE PATIENT EDUCATION Description See Patient Education module for education specifics  Outcome: Progressing Goal: Nutrition Consult-if indicated Outcome: Progressing Goal: Diabetes Guidelines if Diabetic/Glucose > 140 Description If diabetic or lab glucose is > 140 mg/dl - Initiate Diabetes/Hyperglycemia Guidelines & Document Interventions  Outcome: Progressing   

## 2024-06-05 NOTE — Plan of Care (Signed)
  Problem: Consults Goal: RH STROKE PATIENT EDUCATION Description: See Patient Education module for education specifics  Outcome: Progressing Goal: Nutrition Consult-if indicated Outcome: Progressing Goal: Diabetes Guidelines if Diabetic/Glucose > 140 Description: If diabetic or lab glucose is > 140 mg/dl - Initiate Diabetes/Hyperglycemia Guidelines & Document Interventions  Outcome: Progressing   Problem: RH BOWEL ELIMINATION Goal: RH STG MANAGE BOWEL WITH ASSISTANCE Description: STG Manage Bowel with mod I Assistance. Outcome: Progressing Goal: RH STG MANAGE BOWEL W/MEDICATION W/ASSISTANCE Description: STG Manage Bowel with Medication with mod I Assistance. Outcome: Progressing   Problem: RH SAFETY Goal: RH STG ADHERE TO SAFETY PRECAUTIONS W/ASSISTANCE/DEVICE Description: STG Adhere to Safety Precautions With cues Assistance/Device. Outcome: Progressing   Problem: RH KNOWLEDGE DEFICIT Goal: RH STG INCREASE KNOWLEDGE OF DIABETES Description: Patient and family will be able to manage DM using educational resources for medications and dietary modification independently Outcome: Progressing Goal: RH STG INCREASE KNOWLEDGE OF HYPERTENSION Description: Patient and family will be able to manage HTN using educational resources for medications and dietary modification independently Outcome: Progressing Goal: RH STG INCREASE KNOWLEDGE OF DYSPHAGIA/FLUID INTAKE Description: Patient and family will be able to manage Dysphagia using educational resources for medications and dietary modification independently Outcome: Progressing Goal: RH STG INCREASE KNOWLEGDE OF HYPERLIPIDEMIA Description: Patient and family will be able to manage HLD using educational resources for medications and dietary modification independently Outcome: Progressing Goal: RH STG INCREASE KNOWLEDGE OF STROKE PROPHYLAXIS Description: Patient and family will be able to manage secondary risks using educational resources  for medications and dietary modification independently Outcome: Progressing   Problem: Education: Goal: Ability to describe self-care measures that may prevent or decrease complications (Diabetes Survival Skills Education) will improve Outcome: Progressing Goal: Individualized Educational Video(s) Outcome: Progressing   Problem: Coping: Goal: Ability to adjust to condition or change in health will improve Outcome: Progressing   Problem: Fluid Volume: Goal: Ability to maintain a balanced intake and output will improve Outcome: Progressing   Problem: Health Behavior/Discharge Planning: Goal: Ability to identify and utilize available resources and services will improve Outcome: Progressing Goal: Ability to manage health-related needs will improve Outcome: Progressing

## 2024-06-06 ENCOUNTER — Other Ambulatory Visit (HOSPITAL_COMMUNITY): Payer: Self-pay

## 2024-06-06 ENCOUNTER — Other Ambulatory Visit: Payer: Self-pay

## 2024-06-06 DIAGNOSIS — I63521 Cerebral infarction due to unspecified occlusion or stenosis of right anterior cerebral artery: Secondary | ICD-10-CM | POA: Diagnosis not present

## 2024-06-06 LAB — CBC
HCT: 42.6 % (ref 36.0–46.0)
Hemoglobin: 13.4 g/dL (ref 12.0–15.0)
MCH: 26.4 pg (ref 26.0–34.0)
MCHC: 31.5 g/dL (ref 30.0–36.0)
MCV: 83.9 fL (ref 80.0–100.0)
Platelets: 151 K/uL (ref 150–400)
RBC: 5.08 MIL/uL (ref 3.87–5.11)
RDW: 13.5 % (ref 11.5–15.5)
WBC: 8.1 K/uL (ref 4.0–10.5)
nRBC: 0 % (ref 0.0–0.2)

## 2024-06-06 LAB — BASIC METABOLIC PANEL WITH GFR
Anion gap: 11 (ref 5–15)
BUN: 9 mg/dL (ref 6–20)
CO2: 21 mmol/L — ABNORMAL LOW (ref 22–32)
Calcium: 9.3 mg/dL (ref 8.9–10.3)
Chloride: 109 mmol/L (ref 98–111)
Creatinine, Ser: 1.23 mg/dL — ABNORMAL HIGH (ref 0.44–1.00)
GFR, Estimated: 50 mL/min — ABNORMAL LOW (ref >60)
Glucose, Bld: 93 mg/dL (ref 70–99)
Potassium: 3.8 mmol/L (ref 3.5–5.1)
Sodium: 141 mmol/L (ref 135–145)

## 2024-06-06 MED ORDER — POLYETHYLENE GLYCOL 3350 17 GM/SCOOP PO POWD
17.0000 g | Freq: Every day | ORAL | 0 refills | Status: AC
  Filled 2024-06-06 (×2): qty 238, 14d supply, fill #0

## 2024-06-06 MED ORDER — SPIRONOLACTONE 25 MG PO TABS
12.5000 mg | ORAL_TABLET | Freq: Every day | ORAL | 0 refills | Status: DC
  Filled 2024-06-06 (×2): qty 15, 30d supply, fill #0

## 2024-06-06 MED ORDER — GERHARDT'S BUTT CREAM
1.0000 | TOPICAL_CREAM | Freq: Three times a day (TID) | CUTANEOUS | 0 refills | Status: AC
  Filled 2024-06-06 (×3): qty 60, 20d supply, fill #0

## 2024-06-06 MED ORDER — METOPROLOL SUCCINATE ER 200 MG PO TB24
200.0000 mg | ORAL_TABLET | Freq: Every day | ORAL | 0 refills | Status: DC
  Filled 2024-06-06 (×2): qty 30, 30d supply, fill #0

## 2024-06-06 MED ORDER — SENNOSIDES-DOCUSATE SODIUM 8.6-50 MG PO TABS
1.0000 | ORAL_TABLET | Freq: Every day | ORAL | 0 refills | Status: DC
  Filled 2024-06-06 (×2): qty 30, 30d supply, fill #0

## 2024-06-06 MED ORDER — ENSURE PLUS HIGH PROTEIN PO LIQD
237.0000 mL | ORAL | 0 refills | Status: AC
  Filled 2024-06-06 (×2): qty 7110, 30d supply, fill #0

## 2024-06-06 MED ORDER — POTASSIUM CHLORIDE CRYS ER 10 MEQ PO TBCR
10.0000 meq | EXTENDED_RELEASE_TABLET | Freq: Every day | ORAL | 0 refills | Status: DC
  Filled 2024-06-06 (×2): qty 30, 30d supply, fill #0

## 2024-06-06 MED ORDER — ROSUVASTATIN CALCIUM 40 MG PO TABS
40.0000 mg | ORAL_TABLET | Freq: Every day | ORAL | 0 refills | Status: DC
  Filled 2024-06-06 (×2): qty 30, 30d supply, fill #0

## 2024-06-06 MED ORDER — RIVAROXABAN 20 MG PO TABS
20.0000 mg | ORAL_TABLET | Freq: Every day | ORAL | 0 refills | Status: DC
  Filled 2024-06-06 (×2): qty 30, 30d supply, fill #0

## 2024-06-06 MED ORDER — ACETAMINOPHEN 325 MG PO TABS: 325.0000 mg | ORAL_TABLET | ORAL | Status: AC | PRN

## 2024-06-06 NOTE — Plan of Care (Signed)
   Problem: RH Balance Goal: LTG Patient will maintain dynamic standing with ADLs (OT) Description: LTG:  Patient will maintain dynamic standing balance with assist during activities of daily living (OT)  Outcome: Completed/Met   Problem: Sit to Stand Goal: LTG:  Patient will perform sit to stand in prep for activites of daily living with assistance level (OT) Description: LTG:  Patient will perform sit to stand in prep for activites of daily living with assistance level (OT) Outcome: Completed/Met   Problem: RH Eating Goal: LTG Patient will perform eating w/assist, cues/equip (OT) Description: LTG: Patient will perform eating with assist, with/without cues using equipment (OT) Outcome: Completed/Met   Problem: RH Grooming Goal: LTG Patient will perform grooming w/assist,cues/equip (OT) Description: LTG: Patient will perform grooming with assist, with/without cues using equipment (OT) Outcome: Completed/Met   Problem: RH Bathing Goal: LTG Patient will bathe all body parts with assist levels (OT) Description: LTG: Patient will bathe all body parts with assist levels (OT) Outcome: Completed/Met   Problem: RH Dressing Goal: LTG Patient will perform upper body dressing (OT) Description: LTG Patient will perform upper body dressing with assist, with/without cues (OT). Outcome: Completed/Met Goal: LTG Patient will perform lower body dressing w/assist (OT) Description: LTG: Patient will perform lower body dressing with assist, with/without cues in positioning using equipment (OT) Outcome: Completed/Met   Problem: RH Toileting Goal: LTG Patient will perform toileting task (3/3 steps) with assistance level (OT) Description: LTG: Patient will perform toileting task (3/3 steps) with assistance level (OT)  Outcome: Completed/Met   Problem: RH Functional Use of Upper Extremity Goal: LTG Patient will use RT/LT upper extremity as a (OT) Description: LTG: Patient will use right/left upper  extremity as a stabilizer/gross assist/diminished/nondominant/dominant level with assist, with/without cues during functional activity (OT) Outcome: Completed/Met   Problem: RH Toilet Transfers Goal: LTG Patient will perform toilet transfers w/assist (OT) Description: LTG: Patient will perform toilet transfers with assist, with/without cues using equipment (OT) Outcome: Completed/Met   Problem: RH Tub/Shower Transfers Goal: LTG Patient will perform tub/shower transfers w/assist (OT) Description: LTG: Patient will perform tub/shower transfers with assist, with/without cues using equipment (OT) Outcome: Completed/Met

## 2024-06-06 NOTE — Plan of Care (Signed)
  Problem: Consults Goal: RH STROKE PATIENT EDUCATION Description: See Patient Education module for education specifics  Outcome: Progressing   

## 2024-06-06 NOTE — Progress Notes (Signed)
 Occupational Therapy Discharge Summary  Patient Details  Name: KATANA BERTHOLD MRN: [id number] Date of Birth: 04/26/1966  Date of Discharge from OT service:June 06, 2024   Patient has met 11 of 11 long term goals due to improved activity tolerance, improved balance, postural control, improved attention, and improved awareness.  Patient to discharge at [facility] Assist to mod A level.  Patient's care partner is independent to provide the necessary physical and cognitive assistance at discharge.  Two of her daughters attended family education for hands on practice.   Reasons goals not met: n/a  Recommendation:  Patient will benefit from ongoing skilled OT services in home health setting to continue to advance functional skills in the area of BADL.  Equipment: BSC, transfer tub bench, resting hand splint, long handled sponge  Reasons for discharge: treatment goals met  Patient/family agrees with progress made and goals achieved: Yes  OT Discharge Precautions/Restrictions  Precautions Precautions: Fall Restrictions Weight Bearing Restrictions Per Provider Order: No  Resting hand splint for night use ADL ADL Eating: Set up Grooming: Independent Upper Body Bathing: Supervision/safety Where Assessed-Upper Body Bathing: Shower Lower Body Bathing: Minimal assistance Where Assessed-Lower Body Bathing: Shower Upper Body Dressing: Minimal assistance Where Assessed-Upper Body Dressing: Wheelchair Lower Body Dressing: Moderate assistance Where Assessed-Lower Body Dressing: Wheelchair Toileting: Moderate assistance Where Assessed-Toileting: Teacher, adult education: Curator Method: Surveyor, minerals: Engineer, technical sales: International aid/development worker Method: Engineer, technical sales: Insurance underwriter: Insurance underwriter Method: Materials engineer: Grab bars, Sales promotion account executive Baseline Vision/History: 1 Wears glasses Patient Visual Report: No change from baseline Vision Assessment?: Wears glasses for reading;Yes Eye Alignment: Within Functional Limits Ocular Range of Motion: Within Functional Limits Alignment/Gaze Preference: Gaze right Tracking/Visual Pursuits: Able to track stimulus in all quads without difficulty Visual Fields: Other (comment) (pt has strong R gaze preference and demonstrates a L visual field impairment but on testing she can see targets in L visual field) Perception  Perception-Other Comments: L inattention to left side of body and environment, has made minimal improvement from admission Praxis Praxis Impairment Details: Motor planning Praxis-Other Comments: motor planning impairments seen more with mobility skills of bed mobility, scooting back on tub bench or in wc, or side scooting Cognition Cognition Overall Cognitive Status: Impaired/Different from baseline Arousal/Alertness: Awake/alert Memory: Appears intact Attention: Sustained;Focused Focused Attention: Appears intact Sustained Attention: Impaired Sustained Attention Impairment: Verbal basic;Functional basic Awareness: Impaired Awareness Impairment: Emergent impairment;Anticipatory impairment Problem Solving: Impaired Problem Solving Impairment: Functional basic Safety/Judgment: Impaired Comments: safety impaired due to L side inattention Brief Interview for Mental Status (BIMS) Repetition of Three Words (First Attempt): 3 Temporal Orientation: Year: Correct Temporal Orientation: Month: Accurate within 5 days Temporal Orientation: Day: Incorrect Recall: Sock: Yes, no cue required Recall: Blue: Yes, no cue required Recall: Bed: Yes, no cue required BIMS Summary Score: 14 Sensation Sensation Light Touch: Appears Intact Proprioception: Impaired by gross assessment (on L side) Coordination Gross Motor Movements are  Fluid and Coordinated: No Fine Motor Movements are Fluid and Coordinated: No Coordination and Movement Description: dense L hemiplegia with increased tone in LLE and LUE Finger Nose Finger Test: fair on R , unable to with L Motor  Motor Motor: Abnormal tone;Other (comment);Hemiplegia Motor - Discharge Observations: Decreased motor planning especially when fatigued Mobility    Min A stand pivot transfers to mobilize LLE Trunk/Postural Assessment  Postural Control Trunk Control: push to L  has improved since evaluation Righting Reactions: decreased  Balance Static Sitting Balance Static Sitting - Level of Assistance: 5: Stand by assistance Dynamic Sitting Balance Dynamic Sitting - Level of Assistance: 5: Stand by assistance Static Standing Balance Static Standing - Level of Assistance: 5: Stand by assistance Dynamic Standing Balance Dynamic Standing - Level of Assistance: 4: Min assist Extremity/Trunk Assessment RUE Assessment RUE Assessment: Within Functional Limits LUE Assessment Passive Range of Motion (PROM) Comments: WFL Active Range of Motion (AROM) Comments: slight scapular movement General Strength Comments: 0 Brunstrum levels for arm and hand: Arm;Hand Brunstrum level for arm: Stage I Presynergy Brunstrum level for hand: Stage I Flaccidity LUE Tone LUE Tone: Hypertonic;Mild   Monnica Saltsman 06/06/2024, 12:56 PM

## 2024-06-06 NOTE — Progress Notes (Signed)
 Physical Therapy Session Note  Patient Details  Name: [name] MRN: [id number] Date of Birth: March 24, 1966  Today's Date: 06/06/2024 PT Individual Time: 1100-1200 PT Individual Time Calculation (min): 60 min   Short Term Goals: Week 4:  PT Short Term Goal 1 (Week 4): STG = LTG d/t ELOS  Skilled Therapeutic Interventions/Progress Updates:      Pt sitting up in wheelchair with her daughter present to start session. Pt denies any pain and is in agreement to therapy session. Focused treatment on family education and hands on training to prepare for DC.   Reviewed PT POC, PT goals, DME rec's, HHPT follow up, loaner w/c process, AFO wear schedule, home safety, and general fall precautions to be aware of. Daughter unaware of ramp is completed at the other daughter's home where patient will be going. Relayed to CSW that patient will likely require medical transport home given uncertainty.   Patient taken to ortho gym to practice stand pivot transfers. Demonstrated x1 rep to ensure understanding and general safety. Reviewed use of gait belt, monitoring feet placement, wheelchair setup, before transferring. Pt completed stand step transfer at minA level with assist for lateral weight shifting to improve ability to lift her LLE during transfer. Next, had the daughter complete the same transfer with PT providing close supervision for safety. Daughter did well with hand placement on gait belt, cueing patient for stepping and awareness. PT educated daughter on weight shifting technique to help her advance her LLE during transfer which went well during teachback.   Noted patient's L leg rest beginning to fall off and dragging along ground. Needed adjustments and tightening of bolts to better support her limb and reduce risk of injury.   Reviewed functional gait with use of HW - reviewed recommendation to abstain from walking at home with family, only walking with therapy until recommendation changes from  therapist. Pt Completed sit<>stand to [facility] with minA with cues for hand placement. She ambulated 69' with modA and HW on flat surfaces - Pt needing mod cues for sequencing and assist for sweeping her LLE to advance for adequate step length. Gait distance limited by fatigue.   Pt returned to her room and left sitting up in w/c at conclusion of treatment. No further questions or concerns from family.   Therapy Documentation Precautions:  Precautions Precautions: Fall Recall of Precautions/Restrictions: Impaired Restrictions Weight Bearing Restrictions Per Provider Order: No General:     Therapy/Group: Individual Therapy  Sherlean SHAUNNA Perks 06/06/2024, 7:55 AM

## 2024-06-06 NOTE — Progress Notes (Signed)
 PROGRESS NOTE   Subjective/Complaints:   No issues overnite , some recurrent R foot pain   ROS: as per HPI. Denies CP, SOB, abd pain, N/V/D, or any other complaints at this time.    Objective:   No results found.  Recent Labs    06/06/24 0504  WBC 8.1  HGB 13.4  HCT 42.6  PLT 151       Recent Labs    06/06/24 0504  NA 141  K 3.8  CL 109  CO2 21*  GLUCOSE 93  BUN 9  CREATININE 1.23*  CALCIUM  9.3       Intake/Output Summary (Last 24 hours) at 06/06/2024 0909 Last data filed at 06/06/2024 0802 Gross per 24 hour  Intake 120 ml  Output --  Net 120 ml        Physical Exam: Vital Signs Blood pressure 125/70, pulse 77, temperature (!) 97.5 F (36.4 C), temperature source Oral, resp. rate 16, height 5' 3.5 (1.613 m), weight 85.6 kg, SpO2 99%.   General: No acute distress, resting comfortably in bed.  Mood and affect are appropriate Heart: reg rate and ?irreg rhythm no rubs murmurs or extra sounds Lungs: Clear to auscultation, breathing unlabored, no rales or wheezes Abdomen: Positive bowel sounds though hypoactive, soft nontender to palpation, nondistended Extremities: No clubbing, cyanosis, or edema Skin: No evidence of breakdown, no evidence of rash over exposed surfaces Neuro: R gaze preference, slightly dysarthric and delayed, follows commands.    Neuro:   alert today.  Right gaze preference ongoing but can be cued to left verbally. Follows basic commands.  speech sl dysarthric, left central VII. MMT:LUE 0/5. trace Left elbow flexion and Hip Ext LLE 2- with knee ext synergy pattern  . Moves right side freely. Senses pain and light touch in all 4's. LUE DTR's 1+. LLE DTR's brisk 3+ with early extensor tone, toes up.  left pectoralis and elbow flexor tone MAS 1-2     Musculoskeletal: no pain with AROM/PROM in RIght foot/ankle  No pain with ROM, no joint swelling   Assessment/Plan: 1. Functional  deficits which require 3+ hours per day of interdisciplinary therapy in a comprehensive inpatient rehab setting. Physiatrist is providing close team supervision and 24 hour management of active medical problems listed below. Physiatrist and rehab team continue to assess barriers to discharge/monitor patient progress toward functional and medical goals  Care Tool:  Bathing    Body parts bathed by patient: Abdomen, Front perineal area, Left arm, Face, Chest, Right upper leg, Left upper leg, Left lower leg, Right lower leg, Right arm   Body parts bathed by helper: Buttocks     Bathing assist Assist Level: Minimal Assistance - Patient > 75%     Upper Body Dressing/Undressing Upper body dressing   What is the patient wearing?: Pull over shirt    Upper body assist Assist Level: Moderate Assistance - Patient 50 - 74%    Lower Body Dressing/Undressing Lower body dressing      What is the patient wearing?: Incontinence brief, Pants     Lower body assist Assist for lower body dressing: Maximal Assistance - Patient 25 - 49%  Toileting Toileting    Toileting assist Assist for toileting: Maximal Assistance - Patient 25 - 49% (used stedy)     Transfers Chair/bed transfer  Transfers assist  Chair/bed transfer activity did not occur: Safety/medical concerns  Chair/bed transfer assist level: Moderate Assistance - Patient 50 - 74% (minA to the R, modA to the L)     Locomotion Ambulation   Ambulation assist   Ambulation activity did not occur: Safety/medical concerns  Assist level: Moderate Assistance - Patient 50 - 74% Assistive device: Walker-hemi Max distance: 60ft   Walk 10 feet activity   Assist  Walk 10 feet activity did not occur: Safety/medical concerns  Assist level: Moderate Assistance - Patient - 50 - 74% Assistive device: Walker-hemi   Walk 50 feet activity   Assist Walk 50 feet with 2 turns activity did not occur: Safety/medical concerns          Walk 150 feet activity   Assist Walk 150 feet activity did not occur: Safety/medical concerns         Walk 10 feet on uneven surface  activity   Assist Walk 10 feet on uneven surfaces activity did not occur: Safety/medical concerns (decreased safety awareness)         Wheelchair     Assist Is the patient using a wheelchair?: Yes Type of Wheelchair: Manual    Wheelchair assist level: Moderate Assistance - Patient 50 - 74% Max wheelchair distance: 50    Wheelchair 50 feet with 2 turns activity    Assist        Assist Level: Moderate Assistance - Patient 50 - 74%   Wheelchair 150 feet activity     Assist  Wheelchair 150 feet activity did not occur: Safety/medical concerns (decreased safety awareness)   Assist Level: Dependent - Patient 0%   Blood pressure 125/70, pulse 77, temperature (!) 97.5 F (36.4 C), temperature source Oral, resp. rate 16, height 5' 3.5 (1.613 m), weight 85.6 kg, SpO2 99%.  Medical Problem List and Plan: 1. 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 Pt is s/p IR w TICI2b. 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  Antithrombotics: -DVT/anticoagulation:  Pharmaceutical: Xarelto  20mg  daily             -antiplatelet therapy: N/A 3. Pain Management: Tylenol  prn for pain              --Has been on topamax  for HA in the past.  9/12 pain appears controlled 4. Mood/Behavior/Sleep: LCSW to follow for evaluaton and support.              --trazodone  prn for insomnia.              -antipsychotic agents: N/A 5. Neuropsych/cognition: This patient is capable of making decisions on her own behalf.  Hold amantadine  100mg  BID monitor for worsening lethargy     6. Skin/Wound Care: Routine pressure relief measures.   7. Fluids/Electrolytes/Nutrition: Monitor I/O. Add CM restrictions to diet             -Post stroke Dysphagia --SLP following. D2 diet/thins  hypoK+ now on daily KCL   Low K+ 1x dose KCL, on aldactone , creat stable , BUN normal     Latest Ref Rng & Units 06/06/2024    5:04 AM 06/03/2024    5:10 AM 06/02/2024    5:48 AM  BMP  Glucose 70 - 99 mg/dL 93     BUN 6 - 20 mg/dL 9     Creatinine 9.55 - 1.00 mg/dL 8.76     Sodium 864 - 854 mmol/L 141     Potassium 3.5 - 5.1 mmol/L 3.8  3.6  3.6   Chloride 98 - 111 mmol/L 109     CO2 22 - 32 mmol/L 21     Calcium  8.9 - 10.3 mg/dL 9.3      8. A fib: Monitor HR TID-- Metoprolol  succinate 200 mg daily  --Continue Xarelto .  9. HTN: Monitor BP TID. On metoprolol  and aldactone  25mg  daily  -BP stable, monitor  Reduced aldactone  to 12.5mg  every day first dose 9/19 BPs improved 9/22 Vitals:   06/03/24 1245 06/03/24 1937 06/04/24 0548 06/04/24 0914  BP: 101/75 (!) 108/95 121/76 136/80   06/04/24 0955 06/04/24 1109 06/04/24 1604 06/04/24 1954  BP: 136/80 (!) 108/59 (!) 120/98 105/89   06/05/24 0525 06/05/24 1305 06/05/24 1941 06/06/24 0459  BP: 135/77 111/74 114/80 125/70    10. NICM w/HFrEF:  Cardiology Dr. Francyne. Digoxin  and metoprolol  tartrate was stooped. Entresto  not continued. She is continued on spironolactone  and Metoprolol  succinate 200mg  daily in the AM. Trying to keep regimen simple. Appreciate the follow up  11.  Pre-diabetes: had been On Farxiga . Added CM restrictions to diet.  --Monitor BS ac/hs and use SSI tor elevated BS.  Controlled 9/5 only daily checks -9/6-7/25 CBGs fine, could consider d/c checks if continued stability  -9/11 well controlled--dc'd cbg checks    12. HLD- LDL 109, crestor  40 mg daily  13. Obesity:  BMI 34.84 educate on diet and weight loss to promote overall health and mobility.  14. Leukocytosis: no s/s infection, resolved on abx     Latest Ref Rng & Units 06/06/2024    5:04 AM 05/30/2024     5:43 AM 05/26/2024    4:28 AM  CBC  WBC 4.0 - 10.5 K/uL 8.1  8.6  8.8   Hemoglobin 12.0 - 15.0 g/dL 86.5  85.6  85.9   Hematocrit 36.0 - 46.0 % 42.6  45.2  43.4   Platelets 150 - 400 K/uL 151  158  178      15. Slow transit constipation:   -miralax  scheduled daily with prns (refused 9/12) -06/04/24 no BM in 3 days, refusing senokot 9/19 but used miralax  9/20, monitor today but if no BM tomorrow, might need sorbitol .  -06/05/24 still no BM, took miralax  and sorbitol  today, monitor for movement-- if no BM tomorrow, would try MoM  16. UTI txed with Keflex  completed 05/29/24  17.  Intermittent Right foot pain , exam normal with exception of reduced pulses will check ABIs due to hx vascular occlusion, on Xarelto  no venous dopplers indicated at this time  -9/11-12 ABI's demonstrate moderate arterial disease on right and mild on the left. Observe for now. I don't see that right foot is impacting therapy right now. Foot is warm to touch  Can f/u with cardiology on this   LOS: 28 days A FACE TO FACE EVALUATION WAS PERFORMED  [name] 06/06/2024, 9:09 AM

## 2024-06-06 NOTE — Plan of Care (Signed)
  Problem: RH Memory Goal: LTG Patient will demonstrate ability for day to day (SLP) Description: LTG:   Patient will demonstrate ability for day to day recall/carryover during cognitive/linguistic activities with assist  (SLP) Outcome: Not Met (cont to require minA)   Problem: RH Swallowing Goal: LTG Patient will consume least restrictive diet using compensatory strategies with assistance (SLP) Description: LTG:  Patient will consume least restrictive diet using compensatory strategies with assistance (SLP) Outcome: Completed/Met   Problem: RH Cognition - SLP Goal: RH LTG Patient will demonstrate orientation with cues Description:  LTG:  Patient will demonstrate orientation to person/place/time/situation with cues (SLP)   Outcome: Completed/Met   Problem: RH Problem Solving Goal: LTG Patient will demonstrate problem solving for (SLP) Description: LTG:  Patient will demonstrate problem solving for basic/complex daily situations with cues  (SLP) Outcome: Completed/Met   Problem: RH Memory Goal: LTG Patient will use memory compensatory aids to (SLP) Description: LTG:  Patient will use memory compensatory aids to recall biographical/new, daily complex information with cues (SLP) Outcome: Completed/Met   Problem: RH Attention Goal: LTG Patient will demonstrate this level of attention during functional activites (SLP) Description: LTG:  Patient will will demonstrate this level of attention during functional activites (SLP) Outcome: Completed/Met

## 2024-06-06 NOTE — Progress Notes (Signed)
 Speech Language Pathology Discharge Summary  Patient Details  Name: [name] MRN: [id number] Date of Birth: 07/26/1966  Date of Discharge from SLP service:June 06, 2024  Today's Date: 06/06/2024 SLP Individual Time: 0900-0930 1400-1445 SLP Individual Time Calculation (min): 30 min 45 minutes    Skilled Therapeutic Interventions:   Session 1: Skilled therapy session focused on cognitive goals. Patient oriented to self, location, situation and month/year. She required modA to locate todays date/day of the week on calendar. SLP targeted memory through word retention task. After a 10 minute delay, patient independently recalled 3/4 words and recalled remainder given minA. Patient recalled morning and weekend activities with supervisionA. Patient left in bed with alarm set and call bell in reach. Continue POC Session 2: Skilled therapy session focused on cognitive goals. SLP facilitated session by prompting patient to recall BEFAST stroke s/sx. Patient independently recalled 4/6 and recalled remainder with min verbalA. SLP then educated patient on ischemic strokes and their risk factors. Patient verbalized understanding. At the end of the session, SLP targeted L attention by prompting patient to locate letters, colors and shapes on L side of table. Patient completed activity with minA. Of note, patient completed alphabet activity at beginning of rehabilitation stay and patient required upwards of 45 minutes and maxA - this date, patient completed activity in 3-5 minutes with supervision-min A!!! Patient left in [facility] with alarm set and call bell in reach. Continue POC.     Patient has met 5 of 6 long term goals.  Patient to discharge at overall Supervision;Min level.  Reasons goals not met: cont to require minA for new information   Clinical Impression/Discharge Summary:  Pt has made great gains and has met 5 of 6 LTG's this admission due to improved cognition and dysphagia. Pt is currently an  overall supervision-minA for general cognitive tasks including orientation, problem solving, and memory. Patient continues to require modA for L attention. Patient did not meet memory goal this admission as patient requires minA for recall of NEW information. Currently, patient requires supervision cues for utilization of swallowing compensatory strategies to minimize overt s/sx of aspiration with D3/thin diet. Pt/family education complete and pt will discharge home with 24 hour supervision from friends/family/etc. Pt would benefit from [facility] f/u ST services to maximize cognition in order to maximize functional independence.   Care Partner:  Caregiver Able to Provide Assistance: Yes  Type of Caregiver Assistance: Physical;Cognitive  Recommendation:  Home Health SLP;24 hour supervision/assistance  Rationale for SLP Follow Up: Maximize cognitive function and independence   Equipment: n/a   Reasons for discharge: Discharged from hospital   Patient/Family Agrees with Progress Made and Goals Achieved: Yes    Talal Fritchman M.A., CCC-SLP 06/06/2024, 12:21 PM

## 2024-06-06 NOTE — Progress Notes (Signed)
 Inpatient Rehabilitation Care Coordinator Discharge Note   Patient Details  Name: [name] MRN: [id number] Date of Birth: 1966/07/07   Discharge location: HOME WITH DAUGHTER AND MULTIPLE FAMILY MEMBERS TO ASSIST WITH HER CARE. AWARE WILL NEED 24/7 CARE  Length of Stay: 29 days  Discharge activity level: MIN ASSIST LEVEL  Home/community participation: ACTIVE  Patient response un:Yzjouy Literacy - How often do you need to have someone help you when you read instructions, pamphlets, or other written material from your doctor or pharmacy?: Never  Patient response un:Dnrpjo Isolation - How often do you feel lonely or isolated from those around you?: Never  Services provided included: MD, RD, PT, OT, SLP, RN, CM, TR, Pharmacy, Neuropsych, SW  Financial Services:  Field seismologist Utilized: Private Insurance [facility] [employer] MEDICAID  Choices offered to/list presented to: PT AND DAUGHTER  Follow-up services arranged:  Home Health, DME, Patient/Family has no preference for HH/DME agencies Home Health Agency: [facility]  PT  OT  SP    DME : [facility]  HEMI WALKER, DROP-ARM BEDSIDE COMMODE, TUB BENCH   NU MOTION-WHEELCHAIR  PCS REFERRAL COMPLETED. [facility] REFERRAL TO SERVANT CENTER COMPLETED. MOTIVCARE TRANSPORTATION SERVICE GIVEN TO FAMILY FOR WC TRANSPORT TO FOLLOW UP APPOINTMENTS.   Patient response to transportation need: Is the patient able to respond to transportation needs?: Yes In the past 12 months, has lack of transportation kept you from medical appointments or from getting medications?: No In the past 12 months, has lack of transportation kept you from meetings, work, or from getting things needed for daily living?: No   Patient/Family verbalized understanding of follow-up arrangements:  Yes  Individual responsible for coordination of the follow-up plan: [facility] [phone]  Confirmed correct DME delivered: [name] MATSU 06/06/2024    Comments (or  additional information):DAUGHTER'S WERE HERE DAILY AND OBSERVED ALONG WITH PARTICIPATED IN THERAPIES WITH MOM. AWARE OF HER NEED FOR 24/7 CARE AT DC.   Summary of Stay    Date/Time Discharge Planning CSW  06/01/24 [phone] Family here daily and attending therapies with pt. PCS referral made and working on [facility] referral. Need DME recommendations from team RGD  05/25/24 0846 Family members very dedicated to pt and are aware of her 24/7 care needs. Two daughters have taken FMLA and  forms completed for them. Will need speciality wc eval and will work on other DME needs and follow up RGD  05/18/24 0936 Family here daily and has seen Mom in therapy sessions. Hoping for the best and will do better than goals. RGD  05/11/24 0843 HOme with daughter-[name] who is taking a FMLA due to needs childcare for her 60 yo. Other family members who are involved and will assist RGD       [name], Murphy MATSU

## 2024-06-06 NOTE — Progress Notes (Signed)
 Orthopedic Tech Progress Note Patient Details:  [name] [birth year] [id number]  Called in order to [employer] for a [employer] [employer]   Patient ID: [name], female   DOB: [birth year], 58 y.o.   MRN: [id number]  [name] Pac 06/06/2024, 10:29 AM

## 2024-06-06 NOTE — Progress Notes (Signed)
 Patient ID: [name], female   DOB: 1966/03/28, 58 y.o.   MRN: [id number]  Daughter here attending therapies with Mom and doing hands on care. The ramp is not completed so will need to go home via non-emergency ambulance. Daughter to be here tomorrow to go over discharge instructions prior to her discharging. DME has been delivered and set up. Will set up transport for 11:00 am. Daughter in agreement.

## 2024-06-06 NOTE — Progress Notes (Signed)
 Occupational Therapy Session Note  Patient Details  Name: [name] MRN: [id number] Date of Birth: [birth year]  Today's Date: 06/06/2024 OT Individual Time: 9069-8954 OT Individual Time Calculation (min): 75 min    Short Term Goals: Week 1:  OT Short Term Goal 1 (Week 1): Pt will maintain static sit EOB with CGA to supervision to engage in UB self care safely. OT Short Term Goal 1 - Progress (Week 1): Met OT Short Term Goal 2 (Week 1): Pt will complete squat pivot transfers from bed >< w/c with mod A or less with 1 person to work towards a safe toilet transfers. OT Short Term Goal 2 - Progress (Week 1): Progressing toward goal OT Short Term Goal 3 (Week 1): Pt will demonstrate improved L arm awareness to attend to arm during bed mobility tasks with min cues. OT Short Term Goal 3 - Progress (Week 1): Met OT Short Term Goal 4 (Week 1): Pt will be able to don pull over shirt with mod A. OT Short Term Goal 4 - Progress (Week 1): Met Week 2:  OT Short Term Goal 1 (Week 2): Pt will complete squat pivot toilet transfers with mod A or less. OT Short Term Goal 1 - Progress (Week 2): Met OT Short Term Goal 2 (Week 2): Pt will be able to hold static stand with min A to enable caregiver to adjust clothing over hips. OT Short Term Goal 2 - Progress (Week 2): Met OT Short Term Goal 3 (Week 2): pt will be able to reach feet with long sponge to wash feet. OT Short Term Goal 3 - Progress (Week 2): Met OT Short Term Goal 4 (Week 2): pt will complete self ROM for LUE with min A. OT Short Term Goal 4 - Progress (Week 2): Met Week 3:  OT Short Term Goal 1 (Week 3): pt will complete UB Dressing with MIN A OT Short Term Goal 1 - Progress (Week 3): Progressing toward goal OT Short Term Goal 2 (Week 3): pt will complete 1/3 toileting tasks with MODA OT Short Term Goal 2 - Progress (Week 3): Met OT Short Term Goal 3 (Week 3): pt will complete one grooming task at sink MODI OT Short Term Goal 3 - Progress  (Week 3): Met Week 4:  OT Short Term Goal 1 (Week 4): STGs = LTGs  Skilled Therapeutic Interventions/Progress Updates:    Pt seen this session for family education with her daughter.  Reviewed with demonstration and then repeat demonstration: - getting out of bed on R side of bed using R hand to support L arm,  and A needed for movement of L leg - mod A -sitting balance with importance of cuing her to visually scan to the L -important preparation steps for getting ready to stand (foot positioning, support through L knee, alignment of hips, cues for forward lean) -stand pivot to L with hemiwalker -toileting in bathroom -standing balance to manage clothing over hips with 50% help -cues for safe standing.  Transported pt in wc to tub room to go over safe bathroom transfers in their home bathroom.  Demonstrated pt using hemiwalker with min A to move LLE to transfer to L side to toilet and then to tub bench.  Pt did well with bench. Dtr then practiced tub bench to toilet to w/c.    Asked PT to allow dtr to have more time in PT session for hands on practice.    Pt returned to room with dtr.  Requested MD to order resting hand splint and L hand tone is increasing and pt is at risk for hand contracture.   Therapy Documentation Precautions:  Precautions Precautions: Fall Recall of Precautions/Restrictions: Impaired Restrictions Weight Bearing Restrictions Per Provider Order: No   Pain:  No c/o pain     Therapy/Group: Individual Therapy  Cove Haydon 06/06/2024, 11:12 AM

## 2024-06-07 ENCOUNTER — Other Ambulatory Visit (HOSPITAL_COMMUNITY): Payer: Self-pay

## 2024-06-07 ENCOUNTER — Other Ambulatory Visit: Payer: Self-pay

## 2024-06-07 DIAGNOSIS — I63521 Cerebral infarction due to unspecified occlusion or stenosis of right anterior cerebral artery: Secondary | ICD-10-CM | POA: Diagnosis not present

## 2024-06-07 NOTE — Progress Notes (Signed)
 Inpatient Rehabilitation Discharge Medication Review by a Pharmacist  A complete drug regimen review was completed for this patient to identify any potential clinically significant medication issues.  High Risk Drug Classes Is patient taking? Indication by Medication  Antipsychotic No   Anticoagulant Yes Xarelto  - A. fib  Antibiotic No   Opioid No   Antiplatelet No   Hypoglycemics/insulin  No   Vasoactive Medication Yes Metoprolol  and spironolactone  - HTN  Chemotherapy No   Other Yes Rosuvastatin  - HLD KCL - supplementation Senna/Miralax  - constipation     Type of Medication Issue Identified Description of Issue Recommendation(s)  Drug Interaction(s) (clinically significant)     Duplicate Therapy     Allergy     No Medication Administration End Date     Incorrect Dose     Additional Drug Therapy Needed     Significant med changes from prior encounter (inform family/care partners about these prior to discharge).    Other       Clinically significant medication issues were identified that warrant physician communication and completion of prescribed/recommended actions by midnight of the next day:  No  Name of provider notified for urgent issues identified:   Provider Method of Notification:     Pharmacist comments:   Time spent performing this drug regimen review (minutes):  20   [name] Alert 06/07/2024 7:41 AM

## 2024-06-07 NOTE — Plan of Care (Signed)
  Problem: Education: Goal: Individualized Educational Video(s) Outcome: Progressing   Problem: Metabolic: Goal: Ability to maintain appropriate glucose levels will improve Outcome: Progressing   Problem: Nutritional: Goal: Maintenance of adequate nutrition will improve Outcome: Progressing Goal: Progress toward achieving an optimal weight will improve Outcome: Progressing   Problem: Skin Integrity: Goal: Risk for impaired skin integrity will decrease Outcome: Progressing   Problem: Tissue Perfusion: Goal: Adequacy of tissue perfusion will improve Outcome: Progressing

## 2024-06-07 NOTE — Progress Notes (Signed)
 PROGRESS NOTE   Subjective/Complaints:  Some increased R shoulder pain last noc   ROS: as per HPI. Denies CP, SOB, abd pain, N/V/D, or any other complaints at this time.    Objective:   No results found.  Recent Labs    06/06/24 0504  WBC 8.1  HGB 13.4  HCT 42.6  PLT 151       Recent Labs    06/06/24 0504  NA 141  K 3.8  CL 109  CO2 21*  GLUCOSE 93  BUN 9  CREATININE 1.23*  CALCIUM  9.3       Intake/Output Summary (Last 24 hours) at 06/07/2024 0831 Last data filed at 06/07/2024 0800 Gross per 24 hour  Intake 780 ml  Output --  Net 780 ml        Physical Exam: Vital Signs Blood pressure 110/78, pulse 67, temperature 98.8 F (37.1 C), temperature source Oral, resp. rate 17, height 5' 3.5 (1.613 m), weight 85.6 kg, SpO2 100%.   General: No acute distress, resting comfortably in bed.  Mood and affect are appropriate Heart: reg rate and ?irreg rhythm no rubs murmurs or extra sounds Lungs: Clear to auscultation, breathing unlabored, no rales or wheezes Abdomen: Positive bowel sounds though hypoactive, soft nontender to palpation, nondistended Extremities: No clubbing, cyanosis, or edema Skin: No evidence of breakdown, no evidence of rash over exposed surfaces Neuro: R gaze preference, slightly dysarthric and delayed, follows commands.    Neuro:   alert today.  Right gaze preference ongoing but can be cued to left verbally. Follows basic commands.  speech sl dysarthric, left central VII. MMT:LUE 0/5. trace Left elbow flexion and Hip Ext LLE 2- with knee ext synergy pattern  . Moves right side freely. Senses pain and light touch in all 4's. LUE DTR's 1+. LLE DTR's brisk 3+ with early extensor tone, toes up.  left pectoralis and elbow flexor tone MAS 2-3     Musculoskeletal: no pain with AROM/PROM in RIght foot/ankle  No pain with ROM, no joint swelling   Assessment/Plan: 1. Functional deficits due  to large R MCA infarct Stable for D/C today F/u PCP in 3-4 weeks F/u PM&R 2 weeks F/u neuro 1-2 mo F/u cardiology 1-2 mo See D/C summary See D/C instructions   Care Tool:  Bathing    Body parts bathed by patient: Abdomen, Front perineal area, Left arm, Face, Chest, Right upper leg, Left upper leg, Left lower leg, Right lower leg, Right arm   Body parts bathed by helper: Buttocks     Bathing assist Assist Level: Minimal Assistance - Patient > 75%     Upper Body Dressing/Undressing Upper body dressing   What is the patient wearing?: Pull over shirt    Upper body assist Assist Level: Minimal Assistance - Patient > 75%    Lower Body Dressing/Undressing Lower body dressing      What is the patient wearing?: Pants     Lower body assist Assist for lower body dressing: Minimal Assistance - Patient > 75%     Toileting Toileting Toileting Activity did not occur (Clothing management and hygiene only): Refused  Toileting assist Assist for toileting: Moderate Assistance -  Patient 50 - 74%     Transfers Chair/bed transfer  Transfers assist  Chair/bed transfer activity did not occur: Safety/medical concerns  Chair/bed transfer assist level: Minimal Assistance - Patient > 75%     Locomotion Ambulation   Ambulation assist   Ambulation activity did not occur: Safety/medical concerns  Assist level: Moderate Assistance - Patient 50 - 74% Assistive device: Walker-hemi Max distance: 5'   Walk 10 feet activity   Assist  Walk 10 feet activity did not occur: Safety/medical concerns  Assist level: Moderate Assistance - Patient - 50 - 74% Assistive device: Walker-hemi   Walk 50 feet activity   Assist Walk 50 feet with 2 turns activity did not occur: Safety/medical concerns  Assist level: Moderate Assistance - Patient - 50 - 74% Assistive device: Walker-hemi    Walk 150 feet activity   Assist Walk 150 feet activity did not occur: Safety/medical concerns          Walk 10 feet on uneven surface  activity   Assist Walk 10 feet on uneven surfaces activity did not occur: Safety/medical concerns         Wheelchair     Assist Is the patient using a wheelchair?: Yes Type of Wheelchair: Manual    Wheelchair assist level: Moderate Assistance - Patient 50 - 74% Max wheelchair distance: 50    Wheelchair 50 feet with 2 turns activity    Assist        Assist Level: Moderate Assistance - Patient 50 - 74%   Wheelchair 150 feet activity     Assist  Wheelchair 150 feet activity did not occur: Safety/medical concerns (decreased safety awareness)   Assist Level: Maximal Assistance - Patient 25 - 49%   Blood pressure 110/78, pulse 67, temperature 98.8 F (37.1 C), temperature source Oral, resp. rate 17, height 5' 3.5 (1.613 m), weight 85.6 kg, SpO2 100%.  Medical Problem List and Plan: 1. 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 Pt is s/p IR w TICI2b. 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  Antithrombotics: -DVT/anticoagulation:  Pharmaceutical: Xarelto  20mg  daily             -antiplatelet therapy: N/A 3. Pain Management: Tylenol  prn for pain              --Has been on topamax  for HA in the past.  9/12 pain appears controlled 4. Mood/Behavior/Sleep: LCSW to follow for evaluaton and support.              --trazodone  prn for insomnia.              -antipsychotic agents: N/A 5. Neuropsych/cognition: This patient is capable of making decisions on her own behalf.  Held amantadine  100mg  BID no worsening lethargy     6. Skin/Wound Care: Routine pressure relief measures.  7. Fluids/Electrolytes/Nutrition: Monitor I/O. Add CM restrictions to diet             -Post stroke Dysphagia --SLP following. D2  diet/thins            hypoK+ now on daily KCL   Low K+ 1x dose  KCL, on aldactone , creat stable , BUN normal     Latest Ref Rng & Units 06/06/2024    5:04 AM 06/03/2024    5:10 AM 06/02/2024    5:48 AM  BMP  Glucose 70 - 99 mg/dL 93     BUN 6 - 20 mg/dL 9     Creatinine 9.55 - 1.00 mg/dL 8.76     Sodium 864 - 854 mmol/L 141     Potassium 3.5 - 5.1 mmol/L 3.8  3.6  3.6   Chloride 98 - 111 mmol/L 109     CO2 22 - 32 mmol/L 21     Calcium  8.9 - 10.3 mg/dL 9.3      8. A fib: Monitor HR TID-- Metoprolol  succinate 200 mg daily  --Continue Xarelto .  9. HTN: Monitor BP TID. On metoprolol  and aldactone  25mg  daily  -BP stable, monitor  Reduced aldactone  to 12.5mg  every day first dose 9/19 BPs improved 9/22 Vitals:   06/04/24 0914 06/04/24 0955 06/04/24 1109 06/04/24 1604  BP: 136/80 136/80 (!) 108/59 (!) 120/98   06/04/24 1954 06/05/24 0525 06/05/24 1305 06/05/24 1941  BP: 105/89 135/77 111/74 114/80   06/06/24 0459 06/06/24 1555 06/06/24 2100 06/07/24 0415  BP: 125/70 (!) 118/95 92/61 110/78    10. NICM w/HFrEF:  Cardiology Dr. Francyne. Digoxin  and metoprolol  tartrate was stooped. Entresto  not continued. She is continued on spironolactone  and Metoprolol  succinate 200mg  daily in the AM. Trying to keep regimen simple. Appreciate the follow up  11.  Pre-diabetes: had been On Farxiga . Added CM restrictions to diet.  --Monitor BS ac/hs and use SSI tor elevated BS.  Controlled 9/5 only daily checks -9/6-7/25 CBGs fine, could consider d/c checks if continued stability  -9/11 well controlled--dc'd cbg checks    12. HLD- LDL 109, crestor  40 mg daily  13. Obesity:  BMI 34.84 educate on diet and weight loss to promote overall health and mobility.  14. Leukocytosis: no s/s infection, resolved on abx     Latest Ref Rng & Units 06/06/2024    5:04 AM 05/30/2024    5:43 AM 05/26/2024    4:28 AM  CBC  WBC 4.0 - 10.5 K/uL 8.1  8.6  8.8   Hemoglobin 12.0 - 15.0 g/dL 86.5  85.6  85.9   Hematocrit  36.0 - 46.0 % 42.6  45.2  43.4   Platelets 150 - 400 K/uL 151  158  178      15. Slow transit constipation:   -miralax  scheduled daily with prns (refused 9/12) -06/04/24 no BM in 3 days, refusing senokot 9/19 but used miralax  9/20, monitor today but if no BM tomorrow, might need sorbitol .  -06/05/24 still no BM, took miralax  and sorbitol  today, monitor for movement-- if no BM tomorrow, would try MoM  16. UTI txed with Keflex  completed 05/29/24  17.  Intermittent Right foot pain , exam normal with exception of reduced pulses will check ABIs due to hx vascular occlusion, on Xarelto  no venous dopplers indicated at this time  -9/11-12 ABI's demonstrate moderate arterial disease on right and mild on the left. Observe for now. I don't see that right foot is impacting therapy right now. Foot is warm to touch  Can f/u with cardiology on this   LOS: 29 days A FACE TO FACE EVALUATION WAS PERFORMED  Prentice FORBES Compton 06/07/2024, 8:31 AM

## 2024-06-07 NOTE — Progress Notes (Signed)
 Discharge instructions provided to family and patient by [facility] NP. Patient received TOC medication and all other meds sent to pharmacy of choice. DC via EMS. Family received all ordered DME.   Geni Armor, LPN

## 2024-06-07 NOTE — Progress Notes (Signed)
 Patient ID: [name], female   DOB: 1966-09-02, 58 y.o.   MRN: [id number] Pt set up for 11:00 via [facility] to transport home. Daughter coming at 10 to go over DC instructions and pack up the rest of her things. Messaged team and PA

## 2024-06-08 ENCOUNTER — Telehealth: Payer: Self-pay

## 2024-06-08 NOTE — Transitions of Care (Post Inpatient/ED Visit) (Signed)
 06/08/2024  Name: [name] MRN: [id number] DOB: 12/31/1965  Today's TOC FU Call Status: Today's TOC FU Call Status:: Successful TOC FU Call Completed TOC FU Call Complete Date: 06/08/24 Patient's Name and Date of Birth confirmed.  Transition Care Management Follow-up Telephone Call Date of Discharge: 06/07/24 Discharge Facility: [facility]) Type of Discharge: Inpatient Admission Primary Inpatient Discharge Diagnosis:: acute right ACA stroke - discharged from CIR How have you been since you were released from the hospital?: Better (call completed with patient's daughter, [name]) Any questions or concerns?: No  Items Reviewed: Did you receive and understand the discharge instructions provided?: Yes Medications obtained,verified, and reconciled?: Yes (Medications Reviewed) (she has all medications and did not have any questions about the med regime. She also has an Accucheck, not a Freestyle CGM.) Any new allergies since your discharge?: No Dietary orders reviewed?: Yes Type of Diet Ordered:: Dysphagia diet.   Amber said her mother has been tolerating this diet, no concerns reported Do you have support at home?: Yes People in Home [RPT]: child(ren), adult Name of Support/Comfort Primary Source: She lives with her daughter, [name], and has 24/7 support from family.  Medications Reviewed Today: Medications Reviewed Today     Reviewed by Marvis Bradley, RN (Case Manager) on 06/08/24 at 1643  Med List Status: <None>   Medication Order Taking? Sig Documenting Provider Last Dose Status Informant  Accu-Chek FastClix Lancets MISC [id number]  USE AS INSTRUCTED TO CHECK BLOOD SUGAR ONCE DAILY. Newlin, Enobong, MD  Active Child, Pharmacy Records  acetaminophen  (TYLENOL ) 325 MG tablet [id number]  Take 1-2 tablets (325-650 mg total) by mouth every 4 (four) hours as needed for mild pain (pain score 1-3). Leak, Daphne CROME, NP  Active    Patient not taking:   Discontinued 03/28/21 1020 Blood  Glucose Monitoring Suppl (ACCU-CHEK GUIDE ME) w/Device KIT [id number]  1 kit by Does not apply route daily. Delbert Clam, MD  Active Child, Pharmacy Records  Continuous Glucose Receiver (FREESTYLE LIBRE 3 READER) ESPIRIDION [id number]  Apply as directed to test blood glucose Delbert Clam, MD  Active Child, Pharmacy Records  Continuous Glucose Sensor (FREESTYLE LIBRE 3 SENSOR) [place] [id number]  Place 1 sensor on the skin every 14 days. Use to check glucose continuously Newlin, Enobong, MD  Active Child, Pharmacy Records  feeding supplement (ENSURE PLUS HIGH PROTEIN) LIQD [id number]  Take 237 mLs by mouth daily. Leak, Daphne CROME, NP  Active    Patient not taking:   Discontinued 03/28/21 1020 glucose blood test strip [id number]  Use as instructed Newlin, Enobong, MD  Active Child, Pharmacy Records  metoprolol  (TOPROL -XL) 200 MG 24 hr tablet [id number]  Take 1 tablet (200 mg total) by mouth daily. Take with or immediately following a meal. Leak, Brandi L, NP  Active   Nystatin (GERHARDT'S BUTT CREAM) CREA [id number]  Apply 1 Application topically 3 (three) times daily. Leak, Brandi L, NP  Active   polyethylene glycol powder (GLYCOLAX /MIRALAX ) 17 GM/SCOOP powder [id number]  Dissolve 1 capful (17g) in 4-8 ounces of liquid and take by mouth daily. Leak, Brandi L, NP  Active   potassium chloride  (KLOR-CON  M) 10 MEQ tablet [id number]  Take 1 tablet (10 mEq total) by mouth daily. Leak, Brandi L, NP  Active   rivaroxaban  (XARELTO ) 20 MG TABS tablet [id number]  Take 1 tablet (20 mg total) by mouth daily with supper. Leak, Brandi L, NP  Active   rosuvastatin  (CRESTOR ) 40 MG tablet [id number]  Take 1 tablet (40 mg total) by  mouth daily. Leak, Brandi L, NP  Active   senna-docusate (SENOKOT-S) 8.6-50 MG tablet [id number]  Take 1 tablet by mouth at bedtime. Leak, Brandi L, NP  Active   spironolactone  (ALDACTONE ) 25 MG tablet [id number]  Take 0.5 tablets (12.5 mg total) by mouth daily. Leak, Daphne CROME, NP  Active             Home  Care and Equipment/Supplies: Were Home Health Services Ordered?: Yes Name of Home Health Agency:: [facility] Has Agency set up a time to come to your home?: Yes First Home Health Visit Date: 06/09/24 Any new equipment or medical supplies ordered?: Yes Name of Medical supply agency?: [facility]: BSC, tub bench.  [employer]: specialty wheelchair. Amber said she was given a hemi walker while in the hospital. Were you able to get the equipment/medical supplies?: Yes [employer] said they have all of the DME including the wheelchair. She said they also have a hemi walker) Do you have any questions related to the use of the equipment/supplies?: No  Functional Questionnaire: Do you need assistance with bathing/showering or dressing?: Yes (her daughters provide needed assistance.  A PCS referral was placed by hospital staff) Do you need assistance with meal preparation?: Yes (her daughters prepare meals.) Do you need assistance with eating?: Yes Do you have difficulty maintaining continence: Yes Do you need assistance with getting out of bed/getting out of a chair/moving?: Yes (Family provides needed assistance.  Amber said her mother can walk with the hemi walker and assistance.) Do you have difficulty managing or taking your medications?: Yes Catering manager manages the medications.)  Follow up appointments reviewed: PCP Follow-up appointment confirmed?: Yes Date of PCP follow-up appointment?: 06/15/24 Follow-up Provider: Dr [facility] Follow-up appointment confirmed?: Yes Date of Specialist follow-up appointment?: 06/21/24 Follow-Up Specialty Provider:: PMR.   Amber will call cardiology and neurology to schedule follow up appointments,she has their phone numbers that are on AVS Do you need transportation to your follow-up appointment?: Yes (Amber said she will call her mother's Medicaid for transportation. I told her that she will need to let them know if her mother needs specialized  transportation to accommodate her wheelchair. I also told her that they will not go in the house to get her.) Transportation Need Intervention Addressed By:: Other: (noted in comments.  Amber said she does not have a ramp at her home.  I told her that they will need to be able to get her mother out of the house for Medicaid transportation to provide the ride to her appts.) Do you understand care options if your condition(s) worsen?: Yes-patient verbalized understanding    SIGNATURE Slater Diesel, RN

## 2024-06-08 NOTE — Telephone Encounter (Signed)
 No answer, left voicemail for patient to call our office back and request clinic department to complete the TCC call.

## 2024-06-13 DIAGNOSIS — I110 Hypertensive heart disease with heart failure: Secondary | ICD-10-CM | POA: Diagnosis not present

## 2024-06-13 DIAGNOSIS — R32 Unspecified urinary incontinence: Secondary | ICD-10-CM | POA: Diagnosis not present

## 2024-06-13 DIAGNOSIS — E785 Hyperlipidemia, unspecified: Secondary | ICD-10-CM | POA: Diagnosis not present

## 2024-06-13 DIAGNOSIS — Z87891 Personal history of nicotine dependence: Secondary | ICD-10-CM | POA: Diagnosis not present

## 2024-06-13 DIAGNOSIS — I5042 Chronic combined systolic (congestive) and diastolic (congestive) heart failure: Secondary | ICD-10-CM | POA: Diagnosis not present

## 2024-06-13 DIAGNOSIS — I69391 Dysphagia following cerebral infarction: Secondary | ICD-10-CM | POA: Diagnosis not present

## 2024-06-13 DIAGNOSIS — M549 Dorsalgia, unspecified: Secondary | ICD-10-CM | POA: Diagnosis not present

## 2024-06-13 DIAGNOSIS — I4811 Longstanding persistent atrial fibrillation: Secondary | ICD-10-CM | POA: Diagnosis not present

## 2024-06-13 DIAGNOSIS — E119 Type 2 diabetes mellitus without complications: Secondary | ICD-10-CM | POA: Diagnosis not present

## 2024-06-13 DIAGNOSIS — J301 Allergic rhinitis due to pollen: Secondary | ICD-10-CM | POA: Diagnosis not present

## 2024-06-13 DIAGNOSIS — G8929 Other chronic pain: Secondary | ICD-10-CM | POA: Diagnosis not present

## 2024-06-13 DIAGNOSIS — I4729 Other ventricular tachycardia: Secondary | ICD-10-CM | POA: Diagnosis not present

## 2024-06-13 DIAGNOSIS — Z86018 Personal history of other benign neoplasm: Secondary | ICD-10-CM | POA: Diagnosis not present

## 2024-06-13 DIAGNOSIS — I429 Cardiomyopathy, unspecified: Secondary | ICD-10-CM | POA: Diagnosis not present

## 2024-06-13 DIAGNOSIS — D6869 Other thrombophilia: Secondary | ICD-10-CM | POA: Diagnosis not present

## 2024-06-13 DIAGNOSIS — Z8701 Personal history of pneumonia (recurrent): Secondary | ICD-10-CM | POA: Diagnosis not present

## 2024-06-13 DIAGNOSIS — F54 Psychological and behavioral factors associated with disorders or diseases classified elsewhere: Secondary | ICD-10-CM | POA: Diagnosis not present

## 2024-06-13 DIAGNOSIS — I69354 Hemiplegia and hemiparesis following cerebral infarction affecting left non-dominant side: Secondary | ICD-10-CM | POA: Diagnosis not present

## 2024-06-13 DIAGNOSIS — H527 Unspecified disorder of refraction: Secondary | ICD-10-CM | POA: Diagnosis not present

## 2024-06-13 DIAGNOSIS — Z7901 Long term (current) use of anticoagulants: Secondary | ICD-10-CM | POA: Diagnosis not present

## 2024-06-13 DIAGNOSIS — I2510 Atherosclerotic heart disease of native coronary artery without angina pectoris: Secondary | ICD-10-CM | POA: Diagnosis not present

## 2024-06-13 DIAGNOSIS — G4733 Obstructive sleep apnea (adult) (pediatric): Secondary | ICD-10-CM | POA: Diagnosis not present

## 2024-06-13 DIAGNOSIS — Z6834 Body mass index (BMI) 34.0-34.9, adult: Secondary | ICD-10-CM | POA: Diagnosis not present

## 2024-06-15 ENCOUNTER — Other Ambulatory Visit: Payer: Self-pay

## 2024-06-15 ENCOUNTER — Encounter: Payer: Self-pay | Admitting: Family Medicine

## 2024-06-15 ENCOUNTER — Ambulatory Visit: Attending: Family Medicine | Admitting: Family Medicine

## 2024-06-15 VITALS — BP 123/85 | HR 92 | Ht 63.5 in

## 2024-06-15 DIAGNOSIS — Z7901 Long term (current) use of anticoagulants: Secondary | ICD-10-CM

## 2024-06-15 DIAGNOSIS — E1169 Type 2 diabetes mellitus with other specified complication: Secondary | ICD-10-CM | POA: Diagnosis not present

## 2024-06-15 DIAGNOSIS — Z7902 Long term (current) use of antithrombotics/antiplatelets: Secondary | ICD-10-CM

## 2024-06-15 DIAGNOSIS — I152 Hypertension secondary to endocrine disorders: Secondary | ICD-10-CM | POA: Diagnosis not present

## 2024-06-15 DIAGNOSIS — I69354 Hemiplegia and hemiparesis following cerebral infarction affecting left non-dominant side: Secondary | ICD-10-CM | POA: Diagnosis not present

## 2024-06-15 DIAGNOSIS — E1159 Type 2 diabetes mellitus with other circulatory complications: Secondary | ICD-10-CM

## 2024-06-15 DIAGNOSIS — I482 Chronic atrial fibrillation, unspecified: Secondary | ICD-10-CM | POA: Diagnosis not present

## 2024-06-15 DIAGNOSIS — R32 Unspecified urinary incontinence: Secondary | ICD-10-CM

## 2024-06-15 DIAGNOSIS — Z87891 Personal history of nicotine dependence: Secondary | ICD-10-CM

## 2024-06-15 MED ORDER — POTASSIUM CHLORIDE CRYS ER 10 MEQ PO TBCR
10.0000 meq | EXTENDED_RELEASE_TABLET | Freq: Every day | ORAL | 1 refills | Status: DC
  Filled 2024-06-15 – 2024-07-04 (×2): qty 90, 90d supply, fill #0

## 2024-06-15 MED ORDER — ROSUVASTATIN CALCIUM 40 MG PO TABS
40.0000 mg | ORAL_TABLET | Freq: Every day | ORAL | 1 refills | Status: AC
  Filled 2024-06-15 – 2024-07-04 (×2): qty 90, 90d supply, fill #0
  Filled 2024-09-25: qty 90, 90d supply, fill #1

## 2024-06-15 MED ORDER — RIVAROXABAN 20 MG PO TABS
20.0000 mg | ORAL_TABLET | Freq: Every day | ORAL | 1 refills | Status: AC
  Filled 2024-06-15 – 2024-07-04 (×2): qty 90, 90d supply, fill #0
  Filled 2024-09-21: qty 90, 90d supply, fill #1

## 2024-06-15 MED ORDER — SPIRONOLACTONE 25 MG PO TABS
12.5000 mg | ORAL_TABLET | Freq: Every day | ORAL | 1 refills | Status: AC
  Filled 2024-06-15: qty 45, 90d supply, fill #0
  Filled 2024-06-16: qty 15, 30d supply, fill #0
  Filled 2024-07-04 – 2024-07-15 (×3): qty 15, 30d supply, fill #1
  Filled 2024-08-21: qty 15, 30d supply, fill #2
  Filled 2024-09-25: qty 15, 30d supply, fill #3
  Filled 2024-10-11: qty 15, 30d supply, fill #4

## 2024-06-15 MED ORDER — BACLOFEN 10 MG PO TABS
10.0000 mg | ORAL_TABLET | Freq: Three times a day (TID) | ORAL | 0 refills | Status: DC
  Filled 2024-06-15 (×2): qty 30, 10d supply, fill #0

## 2024-06-15 MED ORDER — METOPROLOL SUCCINATE ER 200 MG PO TB24
200.0000 mg | ORAL_TABLET | Freq: Every day | ORAL | 1 refills | Status: DC
  Filled 2024-06-15 – 2024-07-04 (×2): qty 90, 90d supply, fill #0

## 2024-06-15 NOTE — Patient Instructions (Signed)
 VISIT SUMMARY:  You had a follow-up appointment today to discuss your recent stroke and the left-sided weakness you are experiencing. We also reviewed your diabetes, blood pressure, and kidney function. Your daughter, [name], accompanied you to the visit.  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 We will start physical therapy tomorrow to help improve your strength and mobility. You are prescribed baclofen to help with muscle spasms; take it three times a day as needed, but reduce to twice a day if it makes you too sleepy. Do home exercises to keep your muscles from getting stiff, and use your right hand to move your left arm. Elevate your left arm on a pillow to reduce swelling. We will discuss the potential need for a brace with your physical therapist. Ensure your home is safe and avoid activities that could cause you to fall until you get more guidance from physical therapy.  -TYPE 2 DIABETES MELLITUS WITH DIABETIC NEUROPATHY: This means you have diabetes that affects your nerves. Your diabetes management is excellent, and your A1c levels are very good. Keep up the good work.  -HYPERTENSION ASSOCIATED WITH TYPE 2 DIABETES MELLITUS: This means you have high blood pressure related to your diabetes. Your blood pressure is well-controlled with your current medication regimen. We will ensure you have a 90-day supply of all your medications, including Xarelto .  -CHRONIC KIDNEY DISEASE, UNSPECIFIED STAGE: This means your kidneys are not functioning normally, but the exact stage is not specified. We will monitor your kidney function closely. We will recheck your kidney function before you leave the clinic today.  INSTRUCTIONS:  Start physical therapy tomorrow. Take baclofen for muscle spasms as prescribed. Continue your home exercises and elevate your left  arm to reduce swelling. Ensure your home is safe to prevent falls. We will recheck your kidney function before you leave the clinic. Follow up with your physical therapist to discuss the potential need for a brace.

## 2024-06-15 NOTE — Progress Notes (Addendum)
 Subjective:  Patient ID: [name], female    DOB: Mar 17, 1966  Age: 58 y.o. MRN: [id number]  CC: Hospitalization Follow-up (PCS request/Muscle tension)     Discussed the use of AI scribe software for clinical note transcription with the patient, who gave verbal consent to proceed.  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  She is accompanied by her daughter, [name], who is her primary caregiver.   She had presented to  She experiences left-sided weakness following her recent stroke, which is slowly improving. Being right-handed, the weakness presents additional challenges. Physical therapy is scheduled to begin tomorrow. 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  She has painful spasms in her left shoulder and arm, particularly distressing in the mornings, but does not have medication for these spasms.  Her medication regimen includes spironolactone , metoprolol , Crestor , Xarelto  and potassium. She manages her blood pressure by taking her medication at 9:30 AM daily.  Her emotional state is adjusting post-stroke, with strong support from her family, including her daughters and grandchildren. She has not started using a walker at home but has some assistance devices. An appointment with rehab is scheduled for the seventh of this month.    Past Medical History:  Diagnosis Date  . Clotting disorder    on xarelto   . Essential hypertension   . Heart murmur    a. 10/2015  Echo: EF 60-65%, no rwma, mild AI/MR, sev dil LA/RA, PASP .  SABRA Noncompliance   . NSVT (nonsustained ventricular tachycardia) (HCC)    a. 10/2015 during admission for CVA/AF.  SABRA Persistent atrial fibrillation (HCC)    a. CHA2DS2VASC = 4-->Xarelto ;  b. 02/2016 Successful DCCV.  c. Recurrent fib  . Pneumonia 2012 x 2; 2015  . Stroke [facility])    a. 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  . Transient ischemic attack (TIA) 01/2013    Past Surgical History:  Procedure Laterality Date  . CARDIOVERSION N/A 02/18/2013   Procedure: CARDIOVERSION;  Surgeon: Jerel Balding, MD;  [facility] ENDOSCOPY;  Service: Cardiovascular;  Laterality: N/A;  . CARDIOVERSION N/A 02/23/2013   Procedure: CARDIOVERSION;  Surgeon: Alm LELON Clay, MD;  [facility];  Service: Cardiovascular;  Laterality: N/A;  BESIDE CV  . CARDIOVERSION N/A 03/12/2016   Procedure: CARDIOVERSION;  Surgeon: Ezra GORMAN Shuck, MD;  Location: [facility] ENDOSCOPY;  Service: Cardiovascular;  Laterality: N/A;  . IR PERCUTANEOUS ART THROMBECTOMY/INFUSION INTRACRANIAL INC DIAG ANGIO  05/05/2024  . IR US  GUIDE VASC ACCESS RIGHT  05/05/2024  . RADIOLOGY WITH ANESTHESIA N/A 05/05/2024   Procedure: RADIOLOGY WITH ANESTHESIA;  Surgeon: Radiologist, Medication, MD;  Location: [facility] OR;  Service: Radiology;  Laterality: N/A;  . RIGHT/LEFT HEART CATH AND CORONARY ANGIOGRAPHY N/A 04/02/2021   Procedure: RIGHT/LEFT HEART CATH AND CORONARY ANGIOGRAPHY;  Surgeon: Claudene Victory LELON, MD;  Location: [facility] INVASIVE CV LAB;  Service: Cardiovascular;  Laterality: N/A;  . TEE WITHOUT  CARDIOVERSION N/A 02/18/2013   Procedure: TRANSESOPHAGEAL ECHOCARDIOGRAM (TEE);  Surgeon: Jerel Balding, MD;  Location: [facility] ENDOSCOPY;  Service: Cardiovascular;  Laterality: N/A;  . TUBAL LIGATION  1992    Family History  Problem Relation Age of Onset  . Cancer Mother   . Atrial fibrillation Mother   . Breast  cancer Mother   . Hypertension Father   . Atrial fibrillation Father   . Peripheral vascular disease Father   . Hypertension Other   . Diabetes Other   . Heart attack Neg Hx   . Stroke Neg Hx   . Rectal cancer Neg Hx   . Colon cancer Neg Hx   . Esophageal cancer Neg Hx   . Stomach cancer Neg Hx     Social History   Socioeconomic History  . Marital status: Single    Spouse name: Not on file  . Number of children: Not on file  . Years of education: Not on file  . Highest education level: Associate degree: occupational, Scientist, product/process development, or vocational program  Occupational History  . Occupation: Multimedia programmer: [employer]  . Occupation: in home health aide  Tobacco Use  . Smoking status: Former    Current packs/day: 0.00    Types: Cigarettes    Start date: 03/15/2010    Quit date: 03/15/2013    Years since quitting: 11.2  . Smokeless tobacco: Never  Vaping Use  . Vaping status: Never Used  Substance and Sexual Activity  . Alcohol use: Yes    Comment: drinks on the weekends,   2-3 beers  . Drug use: No  . Sexual activity: Not Currently    Birth control/protection: None  Other Topics Concern  . Not on file  Social History Narrative  . Not on file   Social Drivers of Health   Financial Resource Strain: Low Risk  (11/18/2023)   Overall Financial Resource Strain (CARDIA)   . Difficulty of Paying Living Expenses: Not hard at all  Food Insecurity: No Food Insecurity (11/18/2023)   Hunger Vital Sign   . Worried About Programme researcher, broadcasting/film/video in the Last Year: Never true   . Ran Out of Food in the Last Year: Never true  Transportation Needs: Unmet Transportation Needs (11/18/2023)   PRAPARE - Transportation   . Lack of Transportation (Medical): Yes   . Lack of Transportation (Non-Medical): Yes  Physical Activity: Unknown (11/18/2023)   Exercise Vital Sign   . Days of Exercise per Week: 5 days   . Minutes of Exercise per Session: Patient declined  Stress: Stress Concern  Present (11/18/2023)   [employer] of Occupational Health - Occupational Stress Questionnaire   . Feeling of Stress : Rather much  Social Connections: Unknown (11/18/2023)   Social Connection and Isolation Panel   . Frequency of Communication with Friends and Family: Patient declined   . Frequency of Social Gatherings with Friends and Family: Patient declined   . Attends Religious Services: Patient declined   . Active Member of Clubs or Organizations: No   . Attends Banker Meetings: Not on file   . Marital Status: Never married    Allergies  Allergen Reactions  . Penicillins Hives and Other (See Comments)    Unknown  Has patient had a PCN reaction causing immediate rash, facial/tongue/throat swelling, SOB or lightheadedness with hypotension: No Has patient had a PCN reaction causing severe rash involving mucus membranes or skin necrosis: No Has patient had a PCN reaction that  required hospitalization No Has patient had a PCN reaction occurring within the last 10 years: No If all of the above answers are NO, then may proceed with Cephalosporin use.    Outpatient Medications Prior to Visit  Medication Sig Dispense Refill  . Accu-Chek FastClix Lancets MISC USE AS INSTRUCTED TO CHECK BLOOD SUGAR ONCE DAILY. 102 each 0  . acetaminophen  (TYLENOL ) 325 MG tablet Take 1-2 tablets (325-650 mg total) by mouth every 4 (four) hours as needed for mild pain (pain score 1-3).    . Blood Glucose Monitoring Suppl (ACCU-CHEK GUIDE ME) w/Device KIT 1 kit by Does not apply route daily. 1 kit 0  . Continuous Glucose Receiver (FREESTYLE LIBRE 3 READER) DEVI Apply as directed to test blood glucose 1 each 0  . Continuous Glucose Sensor (FREESTYLE LIBRE 3 SENSOR) MISC Place 1 sensor on the skin every 14 days. Use to check glucose continuously 3 each 11  . feeding supplement (ENSURE PLUS HIGH PROTEIN) LIQD Take 237 mLs by mouth daily. 7110 mL 0  . glucose blood test strip Use as instructed  100 strip 0  . metoprolol  (TOPROL -XL) 200 MG 24 hr tablet Take 1 tablet (200 mg total) by mouth daily. Take with or immediately following a meal. 30 tablet 0  . Nystatin (GERHARDT'S BUTT CREAM) CREA Apply 1 Application topically 3 (three) times daily. 60 each 0  . polyethylene glycol powder (GLYCOLAX /MIRALAX ) 17 GM/SCOOP powder Dissolve 1 capful (17g) in 4-8 ounces of liquid and take by mouth daily. 238 g 0  . potassium chloride  (KLOR-CON  M) 10 MEQ tablet Take 1 tablet (10 mEq total) by mouth daily. 30 tablet 0  . rivaroxaban  (XARELTO ) 20 MG TABS tablet Take 1 tablet (20 mg total) by mouth daily with supper. 30 tablet 0  . rosuvastatin  (CRESTOR ) 40 MG tablet Take 1 tablet (40 mg total) by mouth daily. 30 tablet 0  . senna-docusate (SENOKOT-S) 8.6-50 MG tablet Take 1 tablet by mouth at bedtime. 30 tablet 0  . spironolactone  (ALDACTONE ) 25 MG tablet Take 0.5 tablets (12.5 mg total) by mouth daily. 15 tablet 0   No facility-administered medications prior to visit.     ROS Review of Systems  Constitutional:  Negative for activity change and appetite change.  HENT:  Negative for sinus pressure and sore throat.   Respiratory:  Negative for chest tightness, shortness of breath and wheezing.   Cardiovascular:  Negative for chest pain and palpitations.  Gastrointestinal:  Negative for abdominal distention, abdominal pain and constipation.  Genitourinary: Negative.   Musculoskeletal: Negative.   Neurological:  Positive for weakness.  Psychiatric/Behavioral:  Negative for behavioral problems and dysphoric mood.     Objective:  BP 123/85   Pulse 92   Ht 5' 3.5 (1.613 m)   SpO2 97%   BMI 32.90 kg/m      06/15/2024   10:09 AM 06/07/2024    4:15 AM 06/06/2024    9:00 PM  BP/Weight  Systolic BP 123 110 92  Diastolic BP 85 78 61  Wt. (Lbs) --        Physical Exam Constitutional:      Appearance: She is well-developed.  Cardiovascular:     Rate and Rhythm: Normal rate.     Heart  sounds: Normal heart sounds. No murmur heard. Pulmonary:     Effort: Pulmonary effort is normal.     Breath sounds: Normal breath sounds. No wheezing or rales.  Chest:     Chest wall: No tenderness.  Abdominal:  General: Bowel sounds are normal. There is no distension.     Palpations: Abdomen is soft. There is no mass.     Tenderness: There is no abdominal tenderness.  Musculoskeletal:     Right lower leg: No edema.     Left lower leg: No edema.     Comments: Left hemiparesis with left hand edema Left AFO brace in place Inability to move LEFT UPPER EXTREMITY, LLE  Neurological:     Mental Status: She is alert and oriented to person, place, and time.     Cranial Nerves: Cranial nerve deficit, dysarthria and facial asymmetry present.     Motor: Weakness present.  Psychiatric:        Mood and Affect: Mood normal.        Latest Ref Rng & Units 06/06/2024    5:04 AM 06/03/2024    5:10 AM 06/02/2024    5:48 AM  CMP  Glucose 70 - 99 mg/dL 93     BUN 6 - 20 mg/dL 9     Creatinine 9.55 - 1.00 mg/dL 8.76     Sodium 864 - 854 mmol/L 141     Potassium 3.5 - 5.1 mmol/L 3.8  3.6  3.6   Chloride 98 - 111 mmol/L 109     CO2 22 - 32 mmol/L 21     Calcium  8.9 - 10.3 mg/dL 9.3       Lipid Panel     Component Value Date/Time   CHOL 152 05/06/2024 0559   CHOL 201 (H) 04/13/2024 1621   TRIG 39 05/06/2024 0559   HDL 35 (L) 05/06/2024 0559   HDL 49 04/13/2024 1621   CHOLHDL 4.3 05/06/2024 0559   VLDL 8 05/06/2024 0559   LDLCALC 109 (H) 05/06/2024 0559   LDLCALC 140 (H) 04/13/2024 1621    CBC    Component Value Date/Time   WBC 8.1 06/06/2024 0504   RBC 5.08 06/06/2024 0504   HGB 13.4 06/06/2024 0504   HCT 42.6 06/06/2024 0504   PLT 151 06/06/2024 0504   MCV 83.9 06/06/2024 0504   MCH 26.4 06/06/2024 0504   MCHC 31.5 06/06/2024 0504   RDW 13.5 06/06/2024 0504   LYMPHSABS 1.8 05/10/2024 0447   MONOABS 0.9 05/10/2024 0447   EOSABS 0.1 05/10/2024 0447   BASOSABS 0.0  05/10/2024 0447    Lab Results  Component Value Date   HGBA1C 6.1 (H) 05/05/2024      There are no diagnoses linked to this encounter.   Healthcare maintenance ***  No orders of the defined types were placed in this encounter.   Follow-up: No follow-ups on file.       Corrina Sabin, MD, FAAFP. [facility] [place], [place] [phone]   06/15/2024, 10:35 AM

## 2024-06-16 ENCOUNTER — Encounter: Payer: Self-pay | Admitting: Family Medicine

## 2024-06-16 ENCOUNTER — Other Ambulatory Visit: Payer: Self-pay

## 2024-06-16 ENCOUNTER — Ambulatory Visit: Payer: Self-pay | Admitting: Family Medicine

## 2024-06-16 LAB — BASIC METABOLIC PANEL WITH GFR
BUN/Creatinine Ratio: 8 — ABNORMAL LOW (ref 12–28)
BUN: 8 mg/dL (ref 8–27)
CO2: 19 mmol/L — ABNORMAL LOW (ref 20–29)
Calcium: 10.1 mg/dL (ref 8.7–10.3)
Chloride: 105 mmol/L (ref 96–106)
Creatinine, Ser: 0.95 mg/dL (ref 0.57–1.00)
Glucose: 95 mg/dL (ref 70–99)
Potassium: 4.3 mmol/L (ref 3.5–5.2)
Sodium: 142 mmol/L (ref 134–144)
eGFR: 69 mL/min/1.73 (ref >59)

## 2024-06-16 NOTE — Telephone Encounter (Signed)
 Copied from CRM [id number]. Topic: Clinical - Order For Equipment >> Jun 16, 2024  2:11 PM Delon HERO wrote: Reason for CRM: Patient is calling to follow up on paper work sent by [facility] to check up on the incontinence supplies.

## 2024-06-17 ENCOUNTER — Other Ambulatory Visit: Payer: Self-pay

## 2024-06-17 NOTE — Telephone Encounter (Signed)
 Can you addend your office note from her visit on 06/15/24 to add incontinence so we can send information to [facility].

## 2024-06-20 ENCOUNTER — Encounter: Payer: Self-pay | Admitting: Family Medicine

## 2024-06-20 ENCOUNTER — Telehealth (INDEPENDENT_AMBULATORY_CARE_PROVIDER_SITE_OTHER): Payer: Self-pay

## 2024-06-20 DIAGNOSIS — I63521 Cerebral infarction due to unspecified occlusion or stenosis of right anterior cerebral artery: Secondary | ICD-10-CM | POA: Diagnosis not present

## 2024-06-20 NOTE — Telephone Encounter (Signed)
 LVM for return call.

## 2024-06-20 NOTE — Telephone Encounter (Signed)
 Will send to correct nurse

## 2024-06-20 NOTE — Telephone Encounter (Signed)
 Copied from CRM [id number]. Topic: Clinical - Home Health Verbal Orders >> Jun 20, 2024  9:36 AM Pinkey ORN wrote: Caller/Agency: [facility]  Callback Number: [phone] Service Requested: Physical Therapy Frequency: 1 week 9  Any new concerns about the patient? No

## 2024-06-21 ENCOUNTER — Encounter: Admitting: Registered Nurse

## 2024-06-21 DIAGNOSIS — I63521 Cerebral infarction due to unspecified occlusion or stenosis of right anterior cerebral artery: Secondary | ICD-10-CM | POA: Diagnosis not present

## 2024-06-22 DIAGNOSIS — I63521 Cerebral infarction due to unspecified occlusion or stenosis of right anterior cerebral artery: Secondary | ICD-10-CM | POA: Diagnosis not present

## 2024-06-22 NOTE — Telephone Encounter (Signed)
 Verbal order given for patient.  [name] states that there is a hold up with her [employer] and they will get her visit started once its approved.

## 2024-06-22 NOTE — Telephone Encounter (Signed)
 Copied from CRM #[id number]. Topic: Clinical - Home Health Verbal Orders >> Jun 22, 2024 10:32 AM Anairis L wrote: Caller/Agency: Signe GILLIE [facility]  Callback Number: [phone] Service Requested: Occupational Therapy Frequency: 1 week 2week 6 1week one Any new concerns about the patient? Yes Lt arm resting hand splint.

## 2024-06-23 ENCOUNTER — Telehealth: Payer: Self-pay | Admitting: Family Medicine

## 2024-06-23 DIAGNOSIS — I63521 Cerebral infarction due to unspecified occlusion or stenosis of right anterior cerebral artery: Secondary | ICD-10-CM | POA: Diagnosis not present

## 2024-06-23 NOTE — Telephone Encounter (Signed)
 Patient has been informed that all orders and office notes has been faxed to [facility].

## 2024-06-23 NOTE — Telephone Encounter (Signed)
 LVM with verbal orders.

## 2024-06-23 NOTE — Telephone Encounter (Signed)
 Copied from CRM [id number]. Topic: Clinical - Order For Equipment >> Jun 16, 2024  2:11 PM Delon HERO wrote:  Reason for CRM: Patient is calling to follow up on paper work sent by [facility] to check up on the incontinence supplies.  >> Jun 22, 2024  5:53 PM Zebedee SAUNDERS wrote: Received call from [facility] per Amber ph: [phone] fax: [phone] fax request on 06/18/2024 for incontinence supplies.

## 2024-06-24 ENCOUNTER — Encounter: Admitting: Registered Nurse

## 2024-06-27 ENCOUNTER — Telehealth: Payer: Self-pay

## 2024-06-27 DIAGNOSIS — I482 Chronic atrial fibrillation, unspecified: Secondary | ICD-10-CM

## 2024-06-27 DIAGNOSIS — I63521 Cerebral infarction due to unspecified occlusion or stenosis of right anterior cerebral artery: Secondary | ICD-10-CM

## 2024-06-27 DIAGNOSIS — E119 Type 2 diabetes mellitus without complications: Secondary | ICD-10-CM

## 2024-06-27 NOTE — Telephone Encounter (Signed)
 Order has been received and will be faxed once PCP returns to office.    Copied from CRM [id number]. Topic: General - Other >> Jun 27, 2024  3:49 PM Larissa S wrote: Reason for CRM: [name] with home care delivery calling to check on orders BP monitor and incontinence supplies faxed on 10/07. States she will refax the documents today. She is requesting to have orders completed as soon as possible.

## 2024-06-28 NOTE — Progress Notes (Deleted)
 Subjective:    Patient ID: [name], female    DOB: Feb 26, 1966, 58 y.o.   MRN: [id number]  HPI   Pain Inventory Average Pain {NUMBERS; 0-10:5044} Pain Right Now {NUMBERS; 0-10:5044} My pain is {PAIN DESCRIPTION:21022940}  LOCATION OF PAIN  ***  BOWEL Number of stools per week: *** Oral laxative use {YES/NO:21197} Type of laxative *** Enema or suppository use {YES/NO:21197} History of colostomy {YES/NO:21197} Incontinent {YES/NO:21197}  BLADDER {bladder options:24190} In and out cath, frequency *** Able to self cath {YES/NO:21197} Bladder incontinence {YES/NO:21197} Frequent urination {YES/NO:21197} Leakage with coughing {YES/NO:21197} Difficulty starting stream {YES/NO:21197} Incomplete bladder emptying {YES/NO:21197}   Mobility {MOBILITY JIO:78977055}  Function {FUNCTION:21022946}  Neuro/Psych {NEURO/PSYCH:21022948}  Prior Studies {CPRM PRIOR STUDIES:21022953}  Physicians involved in your care {CPRM PHYSICIANS INVOLVED IN YOUR CARE:21022954}   Family History  Problem Relation Age of Onset   Cancer Mother    Atrial fibrillation Mother    Breast cancer Mother    Hypertension Father    Atrial fibrillation Father    Peripheral vascular disease Father    Hypertension Other    Diabetes Other    Heart attack Neg Hx    Stroke Neg Hx    Rectal cancer Neg Hx    Colon cancer Neg Hx    Esophageal cancer Neg Hx    Stomach cancer Neg Hx    Social History   Socioeconomic History   Marital status: Single    Spouse name: Not on file   Number of children: Not on file   Years of education: Not on file   Highest education level: Associate degree: occupational, Scientist, product/process development, or vocational program  Occupational History   Occupation: Multimedia programmer: [employer]   Occupation: in home health aide  Tobacco Use   Smoking status: Former    Current packs/day: 0.00    Types: Cigarettes    Start date: 03/15/2010    Quit date: 03/15/2013    Years  since quitting: 11.2   Smokeless tobacco: Never  Vaping Use   Vaping status: Never Used  Substance and Sexual Activity   Alcohol use: Yes    Comment: drinks on the weekends,   2-3 beers   Drug use: No   Sexual activity: Not Currently    Birth control/protection: None  Other Topics Concern   Not on file  Social History Narrative   Not on file   Social Drivers of Health   Financial Resource Strain: Low Risk  (11/18/2023)   Overall Financial Resource Strain (CARDIA)    Difficulty of Paying Living Expenses: Not hard at all  Food Insecurity: No Food Insecurity (11/18/2023)   Hunger Vital Sign    Worried About Running Out of Food in the Last Year: Never true    Ran Out of Food in the Last Year: Never true  Transportation Needs: Unmet Transportation Needs (11/18/2023)   PRAPARE - Transportation    Lack of Transportation (Medical): Yes    Lack of Transportation (Non-Medical): Yes  Physical Activity: Unknown (11/18/2023)   Exercise Vital Sign    Days of Exercise per Week: 5 days    Minutes of Exercise per Session: Patient declined  Stress: Stress Concern Present (11/18/2023)   [employer] of Occupational Health - Occupational Stress Questionnaire    Feeling of Stress : Rather much  Social Connections: Unknown (11/18/2023)   Social Connection and Isolation Panel    Frequency of Communication with Friends and Family: Patient declined    Frequency of  Social Gatherings with Friends and Family: Patient declined    Attends Religious Services: Patient declined    Database administrator or Organizations: No    Attends Engineer, structural: Not on file    Marital Status: Never married   Past Surgical History:  Procedure Laterality Date   CARDIOVERSION N/A 02/18/2013   Procedure: CARDIOVERSION;  Surgeon: Jerel Balding, MD;  [facility] ENDOSCOPY;  Service: Cardiovascular;  Laterality: N/A;   CARDIOVERSION N/A 02/23/2013   Procedure: CARDIOVERSION;  Surgeon: Alm LELON Clay, MD;   Location: [facility] OR;  Service: Cardiovascular;  Laterality: N/A;  BESIDE CV   CARDIOVERSION N/A 03/12/2016   Procedure: CARDIOVERSION;  Surgeon: Ezra GORMAN Shuck, MD;  Location: [facility] ENDOSCOPY;  Service: Cardiovascular;  Laterality: N/A;   IR PERCUTANEOUS ART THROMBECTOMY/INFUSION INTRACRANIAL INC DIAG ANGIO  05/05/2024   IR US  GUIDE VASC ACCESS RIGHT  05/05/2024   RADIOLOGY WITH ANESTHESIA N/A 05/05/2024   Procedure: RADIOLOGY WITH ANESTHESIA;  Surgeon: Radiologist, Medication, MD;  Location: [facility] OR;  Service: Radiology;  Laterality: N/A;   RIGHT/LEFT HEART CATH AND CORONARY ANGIOGRAPHY N/A 04/02/2021   Procedure: RIGHT/LEFT HEART CATH AND CORONARY ANGIOGRAPHY;  Surgeon: Claudene Victory LELON, MD;  [facility]: [facility] INVASIVE CV LAB;  Service: Cardiovascular;  Laterality: N/A;   TEE WITHOUT CARDIOVERSION N/A 02/18/2013   Procedure: TRANSESOPHAGEAL ECHOCARDIOGRAM (TEE);  Surgeon: Jerel Balding, MD;  Location: [facility] ENDOSCOPY;  Service: Cardiovascular;  Laterality: N/A;   TUBAL LIGATION  1992   Past Medical History:  Diagnosis Date   Clotting disorder    on xarelto    Essential hypertension    Heart murmur    a. 10/2015 Echo: EF 60-65%, no rwma, mild AI/MR, sev dil LA/RA, PASP .   Noncompliance    NSVT (nonsustained ventricular tachycardia) (HCC)    a. 10/2015 during admission for CVA/AF.   Persistent atrial fibrillation (HCC)    a. CHA2DS2VASC = 4-->Xarelto ;  b. 02/2016 Successful DCCV.  c. Recurrent fib   Pneumonia 2012 x 2; 2015   Stroke [facility])    a. 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   Transient ischemic attack (TIA) 01/2013   There were no vitals taken for this visit.  Opioid Risk Score:   Fall Risk Score:  `1  Depression screen [facility] 2/9     04/13/2024    3:16 PM 11/18/2023    3:21 PM 02/25/2023    9:28 AM 10/29/2022    3:31 PM 08/14/2022    2:33 PM 06/07/2022   10:20 AM 04/21/2022    3:19 PM  Depression screen PHQ  2/9  Decreased Interest 0 0 0 0 0 0 0  Down, Depressed, Hopeless 0 0 0 0 0 0 0  PHQ - 2 Score 0 0 0 0 0 0 0  Altered sleeping 0  0 0   0  Tired, decreased energy 0 0 0 0   0  Change in appetite 0 0 0 0   0  Feeling bad or failure about yourself  0 0 0 0   0  Trouble concentrating 0 0 0 0   0  Moving slowly or fidgety/restless 0 0 0 0   0  Suicidal thoughts 0 0 0 0   0  PHQ-9 Score 0  0 0   0  Difficult doing work/chores Not difficult at all   Not difficult at all  Not difficult at all Not difficult at all  Review of Systems     Objective:   Physical Exam        Assessment & Plan:

## 2024-06-29 ENCOUNTER — Encounter: Admitting: Registered Nurse

## 2024-06-30 ENCOUNTER — Telehealth: Payer: Self-pay

## 2024-06-30 ENCOUNTER — Telehealth: Payer: Self-pay | Admitting: Family Medicine

## 2024-06-30 DIAGNOSIS — E119 Type 2 diabetes mellitus without complications: Secondary | ICD-10-CM

## 2024-06-30 NOTE — Progress Notes (Signed)
 Complex Care Management Note  Care Guide Note 06/30/2024 Name: [name] MRN: [id number] DOB: 1965-10-25  [name] is a 58 y.o. year old female who sees Newlin, Enobong, MD for primary care. I reached out to [name] by phone today to offer complex care management services.  [name] was given information about Complex Care Management services today including:   The Complex Care Management services include support from the care team which includes your Nurse Care Manager, Clinical Social Worker, or Pharmacist.  The Complex Care Management team is here to help remove barriers to the health concerns and goals most important to you. Complex Care Management services are voluntary, and the patient may decline or stop services at any time by request to their care team member.   Complex Care Management Consent Status: Patient agreed to services and verbal consent obtained.   Follow up plan:  Telephone appointment with complex care management team member scheduled for:  07/07/24  Encounter Outcome:  Patient Scheduled [name] Satterfield  [facility]  [facility], [facility] Guide  Direct Dial : [phone]  Fax [phone]

## 2024-06-30 NOTE — Progress Notes (Signed)
 Complex Care Management Note Care Guide Note  06/30/2024 Name: [name] MRN: [id number] DOB: Jun 15, 1966   Complex Care Management Outreach Attempts: An unsuccessful telephone outreach was attempted today to offer the patient information about available complex care management services.  Follow Up Plan:  Additional outreach attempts will be made to offer the patient complex care management information and services.   Encounter Outcome:  No Answer  .Debbe Fuse [facility], [facility] Guide  Direct Dial : [phone]  Fax [phone]

## 2024-06-30 NOTE — Telephone Encounter (Signed)
 Copied from CRM [id number]. Topic: Clinical - Order For Equipment >> Jun 30, 2024  1:16 PM Hadassah PARAS wrote:  Reason for CRM: Corean from [facility] orders called in to check if we received the orders for [employer] supply 10/13 and physical 10/14 through fax. Please advice [phone]

## 2024-07-01 ENCOUNTER — Telehealth: Payer: Self-pay

## 2024-07-01 NOTE — Progress Notes (Signed)
   Telephone encounter was:  Unsuccessful.  07/01/2024 Name: [name] MRN: [id number] DOB: 1966/07/31  Unsuccessful outbound call made today to assist with:  Transportation Needs   Outreach Attempt:  1st Attempt  No answer and unable to leave a message    Jon Colt [facility]  [facility] Guide, Phone: [phone] Fax: [phone] Website: [email or web address]

## 2024-07-01 NOTE — Telephone Encounter (Signed)
 Noted

## 2024-07-04 ENCOUNTER — Other Ambulatory Visit: Payer: Self-pay

## 2024-07-04 ENCOUNTER — Encounter: Payer: Self-pay | Admitting: Registered Nurse

## 2024-07-04 ENCOUNTER — Other Ambulatory Visit: Payer: Self-pay | Admitting: Family Medicine

## 2024-07-04 ENCOUNTER — Encounter: Attending: Registered Nurse | Admitting: Registered Nurse

## 2024-07-04 VITALS — BP 125/81 | HR 87 | Ht 63.5 in

## 2024-07-04 DIAGNOSIS — I63521 Cerebral infarction due to unspecified occlusion or stenosis of right anterior cerebral artery: Secondary | ICD-10-CM | POA: Diagnosis not present

## 2024-07-04 DIAGNOSIS — I10 Essential (primary) hypertension: Secondary | ICD-10-CM | POA: Insufficient documentation

## 2024-07-04 DIAGNOSIS — Z794 Long term (current) use of insulin: Secondary | ICD-10-CM | POA: Diagnosis not present

## 2024-07-04 DIAGNOSIS — I482 Chronic atrial fibrillation, unspecified: Secondary | ICD-10-CM | POA: Diagnosis not present

## 2024-07-04 DIAGNOSIS — E119 Type 2 diabetes mellitus without complications: Secondary | ICD-10-CM | POA: Insufficient documentation

## 2024-07-04 MED ORDER — BACLOFEN 10 MG PO TABS
10.0000 mg | ORAL_TABLET | Freq: Three times a day (TID) | ORAL | 0 refills | Status: DC
  Filled 2024-07-04 – 2024-07-05 (×2): qty 30, 10d supply, fill #0

## 2024-07-04 NOTE — Progress Notes (Signed)
 Subjective:    Patient ID: [name], female    DOB: Jun 08, 1966, 58 y.o.   MRN: [id number]  HPI: [name] is a 58 y.o. female who  is here HFU appointment for follow up of her Acute Right ACA Stroke, Essential Hypertension, Chronic Atrial Fibrillation, Type 2 Diabetes Mellitus without complication, without long-term current use of insulin . She presented to [facility] on 05/05/2024 via EMS after acute onset of left sided weakness while at work.   Dr. Michaela: H&P: 05/05/2024 [name] is a 58 y.o. 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    On exam at bridge, patient is alert and fully oriented, no gaze preference, left hemianopia, left neglect, mild expressive aphsia and dysarthira, left facial droop (chronic per chart review). 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 She denies a headache, dizziness. She states that she last took her Xarelto  on Tuesday.CBG WNL, BP 196/107.    CTH negative. CTA shows abrupt occlusion of Right M1 segment. Risks, benefits and alternatives of IVT discussed with patient and she agreed to proceed with procedure.    CT: Head: CTA: IMPRESSION: Non-contrast head CT:   1. No acute intracranial hemorrhage or acute demarcated cortical infarct. 2. Redemonstrated chronic infarcts within the right MCA vascular territory and within the cerebellum.   CTA neck:   1. The common carotid and internal carotid arteries are patent within the neck. Plaque within the proximal right ICA resulting in less than 50% stenosis, progressed since the CTA of 02/08/2018. Progressive plaque within the proximal left ICA resulting in 65% stenosis. 2. Vertebral arteries patent within neck without stenosis. 3. Aortic  Atherosclerosis (ICD10-I70.0).   CTA head:   1. 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 2. Non-stenotic atherosclerotic plaque within the intracranial ICAs.   MR: Brain: WO Contrast IMPRESSION: Acute right MCA territory infarcts with petechial hemorrhage, as described above. No mass effect.   [name] Underwent: Per Dr Delores  Note 8/21-mechanical thrombectomy with TICI 2b revascularization   [name] was admitted to inpatient rehabilitation on 05/09/2024 and discharged home with daughter on 06/07/2024, daughter providing care. [name] reports she is walking with her therapist only, arrived in wheelchair.  She denies any pain . She rates her pain 0. Reports she has a poor appetite, we discussed small frequent meals, she verbalizes understanding. Educated on medication compliance, she verbalizes understanding.   [facility] was called, she is scheduled for HFU appointment with Dr Rosemarie. Daughter called Cardiology, she has a scheduled HFU appointment with Dr. Francyne.   Daughter in room.     Pain Inventory Average Pain 0 Pain Right Now 0 My pain is constant and tingling  LOCATION OF PAIN  left hand  BOWEL Number of stools per week:N/A   BLADDER Normal UTI/itching and odor  Mobility walk with assistance ability to climb steps?  no do you drive?  no Do you have any goals in this area?  yes  get back to walking  Function employed # of hrs/week 37.5 what is your job? [employer] Do you have any goals in this area?  yes  Neuro/Psych bowel  control problems weakness numbness trouble walking spasms confusion depression anxiety loss of taste or smell suicidal thoughts  Prior Studies Any changes since last visit?  no  Physicians involved in your care Any changes since last visit?  no   Family History  Problem Relation Age of Onset   Cancer Mother    Atrial fibrillation Mother     Breast cancer Mother    Hypertension Father    Atrial fibrillation Father    Peripheral vascular disease Father    Hypertension Other    Diabetes Other    Heart attack Neg Hx    Stroke Neg Hx    Rectal cancer Neg Hx    Colon cancer Neg Hx    Esophageal cancer Neg Hx    Stomach cancer Neg Hx    Social History   Socioeconomic History   Marital status: Single    Spouse name: Not on file   Number of children: Not on file   Years of education: Not on file   Highest education level: Associate degree: occupational, Scientist, product/process development, or vocational program  Occupational History   Occupation: Multimedia programmer: [employer]   Occupation: in home health aide  Tobacco Use   Smoking status: Former    Current packs/day: 0.00    Types: Cigarettes    Start date: 03/15/2010    Quit date: 03/15/2013    Years since quitting: 11.3   Smokeless tobacco: Never  Vaping Use   Vaping status: Never Used  Substance and Sexual Activity   Alcohol use: Yes    Comment: drinks on the weekends,   2-3 beers   Drug use: No   Sexual activity: Not Currently    Birth control/protection: None  Other Topics Concern   Not on file  Social History Narrative   Not on file   Social Drivers of Health   Financial Resource Strain: Low Risk  (11/18/2023)   Overall Financial Resource Strain (CARDIA)    Difficulty of Paying Living Expenses: Not hard at all  Food Insecurity: No Food Insecurity (11/18/2023)   Hunger Vital Sign    Worried About Running Out of Food in the Last Year: Never true    Ran Out of Food in the Last Year: Never true  Transportation Needs: Unmet Transportation Needs (11/18/2023)   PRAPARE - Transportation    Lack of Transportation (Medical): Yes    Lack of Transportation (Non-Medical): Yes  Physical Activity: Unknown (11/18/2023)   Exercise Vital Sign    Days of Exercise per Week: 5 days    Minutes of Exercise per Session: Patient declined  Stress: Stress Concern Present (11/18/2023)    [employer] of Occupational Health - Occupational Stress Questionnaire    Feeling of Stress : Rather much  Social Connections: Unknown (11/18/2023)   Social Connection and Isolation Panel    Frequency of Communication with Friends and Family: Patient declined    Frequency of Social Gatherings with Friends and Family: Patient declined    Attends Religious Services: Patient declined    Database administrator or Organizations: No    Attends Engineer, structural: Not on file    Marital Status: Never married   Past Surgical History:  Procedure Laterality Date   CARDIOVERSION N/A 02/18/2013   Procedure: CARDIOVERSION;  Surgeon: Jerel Balding, MD;  Location: [facility] ENDOSCOPY;  Service: Cardiovascular;  Laterality: N/A;   CARDIOVERSION N/A 02/23/2013   Procedure: CARDIOVERSION;  Surgeon: Alm LELON Clay, MD;  Location: [facility] OR;  Service: Cardiovascular;  Laterality: N/A;  BESIDE CV   CARDIOVERSION N/A 03/12/2016   Procedure: CARDIOVERSION;  Surgeon: Ezra GORMAN Shuck, MD;  Location: [facility] ENDOSCOPY;  Service: Cardiovascular;  Laterality: N/A;   IR PERCUTANEOUS ART THROMBECTOMY/INFUSION INTRACRANIAL INC DIAG ANGIO  05/05/2024   IR US  GUIDE VASC ACCESS RIGHT  05/05/2024   RADIOLOGY WITH ANESTHESIA N/A 05/05/2024   Procedure: RADIOLOGY WITH ANESTHESIA;  Surgeon: Radiologist, Medication, MD;  Location: [facility] OR;  Service: Radiology;  Laterality: N/A;   RIGHT/LEFT HEART CATH AND CORONARY ANGIOGRAPHY N/A 04/02/2021   Procedure: RIGHT/LEFT HEART CATH AND CORONARY ANGIOGRAPHY;  Surgeon: Claudene Victory ORN, MD;  Location: [facility] INVASIVE CV LAB;  Service: Cardiovascular;  Laterality: N/A;   TEE WITHOUT CARDIOVERSION N/A 02/18/2013   Procedure: TRANSESOPHAGEAL ECHOCARDIOGRAM (TEE);  Surgeon: Jerel Balding, MD;  Location: [facility] ENDOSCOPY;  Service: Cardiovascular;  Laterality: N/A;   TUBAL LIGATION  1992   Past Medical History:  Diagnosis Date   Clotting disorder    on xarelto    Essential hypertension    Heart murmur     a. 10/2015 Echo: EF 60-65%, no rwma, mild AI/MR, sev dil LA/RA, PASP .   Noncompliance    NSVT (nonsustained ventricular tachycardia) (HCC)    a. 10/2015 during admission for CVA/AF.   Persistent atrial fibrillation (HCC)    a. CHA2DS2VASC = 4-->Xarelto ;  b. 02/2016 Successful DCCV.  c. Recurrent fib   Pneumonia 2012 x 2; 2015   Stroke [facility])    a. 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   Transient ischemic attack (TIA) 01/2013   BP 125/81   Pulse 87   Ht 5' 3.5 (1.613 m)   SpO2 98%   BMI 32.90 kg/m   Opioid Risk Score:   Fall Risk Score:  `1  Depression screen [facility] 2/9     07/04/2024    9:21 AM 04/13/2024    3:16 PM 11/18/2023    3:21 PM 02/25/2023    9:28 AM 10/29/2022    3:31 PM 08/14/2022    2:33 PM 06/07/2022   10:20 AM  Depression screen PHQ 2/9  Decreased Interest 3 0 0 0 0 0 0  Down, Depressed, Hopeless 3 0 0 0 0 0 0  PHQ - 2 Score 6 0 0 0 0 0 0  Altered sleeping 2 0  0 0    Tired, decreased energy 2 0 0 0 0    Change in appetite 1 0 0 0 0    Feeling bad or failure about yourself  2 0 0 0 0    Trouble concentrating 0 0 0 0 0    Moving slowly or fidgety/restless 0 0 0 0 0    Suicidal thoughts 0 0 0 0 0    PHQ-9 Score 13 0  0 0    Difficult doing work/chores  Not difficult at all   Not difficult at all  Not difficult at all    Review of Systems  Cardiovascular:  Positive for leg swelling.  Gastrointestinal:  Positive for constipation.       Bowel control  Genitourinary:        ?UTI-itching and odor  Musculoskeletal:  Positive for gait problem.       Spasms  Skin:  Positive for rash and wound.  Neurological:  Positive for weakness.  Psychiatric/Behavioral:  Positive for confusion, dysphoric mood and suicidal ideas. The patient is nervous/anxious.   All other systems reviewed  and are negative.      Objective:   Physical Exam Vitals and nursing note reviewed.   Constitutional:      Appearance: Normal appearance.  Cardiovascular:     Rate and Rhythm: Normal rate and regular rhythm.     Pulses: Normal pulses.     Heart sounds: Normal heart sounds.  Pulmonary:     Effort: Pulmonary effort is normal.     Breath sounds: Normal breath sounds.  Musculoskeletal:     Comments: Normal Muscle Bulk and Muscle Testing Reveals:  Upper Extremities: Right: Full ROM and Muscle Strength 5/5 Left Upper Extremity: Decreased ROM and Muscle Strength 0/5   Lumbar Paraspinal Tenderness: L-4-L-5 Lower Extremities: Right: Full ROM and Muscle Strength 5/5 Left Lower Extremity: Decreased ROM and Muscle Strength 0/5 Wearing AFO Arrived in wheelchair     Skin:    General: Skin is warm and dry.  Neurological:     Mental Status: She is alert and oriented to person, place, and time.  Psychiatric:        Mood and Affect: Mood normal.        Behavior: Behavior normal.          Assessment & Plan:  Acute Right ACA Stroke: [facility] called, she has a scheduled appointment with Dr Rosemarie. Continue current medication regimen. Continue to Monitor.   Essential Hypertension: Continue current medication regimen. PCP following. Continue to Monitor.  3.  Chronic Atrial Fibrillation: She has a scheduled appointment with Cardiology, continue current medication regimen.  4.  Type 2 Diabetes Mellitus without complication, without long-term current use of insulin .PCP following. Continue to Monitor.   F/U with Dr Carilyn in 4-6 weeks

## 2024-07-05 ENCOUNTER — Other Ambulatory Visit: Payer: Self-pay

## 2024-07-05 ENCOUNTER — Telehealth: Payer: Self-pay

## 2024-07-05 DIAGNOSIS — I639 Cerebral infarction, unspecified: Secondary | ICD-10-CM | POA: Diagnosis not present

## 2024-07-05 DIAGNOSIS — I10 Essential (primary) hypertension: Secondary | ICD-10-CM | POA: Diagnosis not present

## 2024-07-05 NOTE — Progress Notes (Signed)
   Telephone encounter was:  Successful.  Complex Care Management Note Care Guide Note  07/05/2024 Name: [name] MRN: [id number] DOB: March 15, 1966  [name] is a 58 y.o. year old female who is a primary care patient of Newlin, Enobong, MD . The community resource team was consulted for assistance with Transportation Needs   SDOH screenings and interventions completed:  Yes  Social Drivers of Health From This Encounter   Transportation Needs: No Transportation Needs (07/05/2024)   PRAPARE - Administrator, Civil Service (Medical): No    Lack of Transportation (Non-Medical): No    SDOH Interventions Today    Flowsheet Row Most Recent Value  SDOH Interventions   Transportation Interventions Community Resources Provided     Care guide performed the following interventions: Patient provided with information about care guide support team and interviewed to confirm resource needs.Pt has Medicaid transportation with no needs at this time. Pt did request that I mail resources to her just in case she might need them later   Follow Up Plan:  No further follow up planned at this time. The patient has been provided with needed resources.  Encounter Outcome:  Patient Visit Completed    Jon Colt [facility]  [facility] Guide, Phone: [phone] Fax: [phone] Website: [email or web address]

## 2024-07-06 ENCOUNTER — Telehealth: Payer: Self-pay | Admitting: Family Medicine

## 2024-07-06 ENCOUNTER — Other Ambulatory Visit: Payer: Self-pay

## 2024-07-06 ENCOUNTER — Encounter: Payer: Self-pay | Admitting: Family Medicine

## 2024-07-06 ENCOUNTER — Telehealth (HOSPITAL_BASED_OUTPATIENT_CLINIC_OR_DEPARTMENT_OTHER): Admitting: Family Medicine

## 2024-07-06 DIAGNOSIS — K120 Recurrent oral aphthae: Secondary | ICD-10-CM

## 2024-07-06 DIAGNOSIS — N3000 Acute cystitis without hematuria: Secondary | ICD-10-CM

## 2024-07-06 DIAGNOSIS — I69354 Hemiplegia and hemiparesis following cerebral infarction affecting left non-dominant side: Secondary | ICD-10-CM | POA: Diagnosis not present

## 2024-07-06 DIAGNOSIS — L309 Dermatitis, unspecified: Secondary | ICD-10-CM | POA: Diagnosis not present

## 2024-07-06 DIAGNOSIS — G8929 Other chronic pain: Secondary | ICD-10-CM

## 2024-07-06 MED ORDER — GABAPENTIN 300 MG PO CAPS
300.0000 mg | ORAL_CAPSULE | Freq: Every day | ORAL | 3 refills | Status: DC
  Filled 2024-07-06: qty 30, 30d supply, fill #0
  Filled 2024-07-23 – 2024-08-04 (×4): qty 30, 30d supply, fill #1

## 2024-07-06 MED ORDER — LIDOCAINE VISCOUS HCL 2 % MT SOLN
15.0000 mL | Freq: Four times a day (QID) | OROMUCOSAL | 0 refills | Status: AC | PRN
  Filled 2024-07-06: qty 100, 2d supply, fill #0

## 2024-07-06 MED ORDER — NITROFURANTOIN MONOHYD MACRO 100 MG PO CAPS
100.0000 mg | ORAL_CAPSULE | Freq: Two times a day (BID) | ORAL | 0 refills | Status: DC
  Filled 2024-07-06: qty 10, 5d supply, fill #0

## 2024-07-06 MED ORDER — TRIAMCINOLONE ACETONIDE 0.1 % EX CREA
1.0000 | TOPICAL_CREAM | Freq: Two times a day (BID) | CUTANEOUS | 1 refills | Status: AC
  Filled 2024-07-06: qty 45, 23d supply, fill #0
  Filled 2024-09-11 – 2024-09-14 (×2): qty 45, 23d supply, fill #1

## 2024-07-06 NOTE — Telephone Encounter (Signed)
Form has been received.

## 2024-07-06 NOTE — Patient Instructions (Signed)
 VISIT SUMMARY:  During your visit, we discussed your severe, widespread pain, sleep disturbances, mouth ulcers, leg rash, and urinary tract infection. We reviewed your symptoms and made adjustments to your treatment plan to help manage your conditions more effectively.  YOUR PLAN:  -STROKE WITH RESIDUAL WEAKNESS AND NEUROPATHIC PAIN: You have chronic pain and weakness following your stroke, which affects your arms and back and disrupts your sleep. We have prescribed gabapentin  300 mg to be taken once daily at night, and you can increase it to twice daily if needed for better pain control, as long as it does not cause excessive daytime sleepiness. We also documented the need for a hospital bed to help manage your pain and weakness.  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 The gabapentin  prescribed for your neuropathic pain may also help manage this condition.  -URINARY TRACT INFECTION DUE TO E. COLI: You have a urinary tract infection caused by E. coli bacteria, which has persisted since your recent hospital stay. We have prescribed nitrofurantoin, one tablet to be taken twice daily for five days, based on the sensitivity of the bacteria.  -DERMATITIS OF BILATERAL LOWER EXTREMITIES: You have a red and itchy rash on your legs that has not responded to hydrocortisone . We have prescribed triamcinolone  cream to help with the rash and itching. Please monitor for any spread of the rash to your upper body, as this may indicate an allergic reaction to medication.  -APHTHOUS ULCERS OF MOUTH: You have painful mouth ulcers likely due to a recent illness and hospitalization. We have prescribed viscous lidocaine  to numb the ulcers during brushing. Additionally, we recommend taking over-the-counter vitamin B12 and vitamin C supplements to support your immune  system.  INSTRUCTIONS:  Please follow up as needed for any worsening symptoms or if the rash spreads to your upper body. Continue taking your medications as prescribed and monitor your response to the treatments. If you experience any new or worsening symptoms, contact our office for further evaluation.

## 2024-07-06 NOTE — Telephone Encounter (Signed)
 Copied from CRM [id number]. Topic: Clinical - Order For Equipment >> Jul 06, 2024  3:13 PM Dedra B wrote: Reason for CRM: Grayce from [facility] called to follow up orders faxed fro incontinence on 10/13 and diabetic supplies on 10/20. Sonya can be reached at [phone].

## 2024-07-06 NOTE — Progress Notes (Signed)
 Virtual Visit via Video Note  I connected with [name], on 07/06/2024 at 5:19 PM by video enabled telemedicine device and verified that I am speaking with the correct person using two identifiers.   Consent: I discussed the limitations, risks, security and privacy concerns of performing an evaluation and management service by telemedicine and the availability of in person appointments. I also discussed with the patient that there may be a patient responsible charge related to this service. The patient expressed understanding and agreed to proceed.   Location of Patient: Home  Location of Provider: Clinic   Persons participating in Telemedicine visit: [name] Dr. Terrilyn Tyner Latoya - daughter    Discussed the use of AI scribe software for clinical note transcription with the patient, who gave verbal consent to proceed.  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  She experiences severe, widespread throbbing pain in her arms, back, and other areas, which has worsened since her last visit. The pain disrupts her sleep, causing irritability and discomfort, and is accompanied by jerking in her foot during sleep. Changing sleeping positions has not alleviated the pain.  She has been undergoing PT and due to her left hemiparesis will benefit from a hospital bed.  She has red bumps in her mouth that cause discomfort when brushing her teeth but  do not hurt when eating. A rash has developed on her legs, starting at the thighs and extending downwards. The rash is red and itchy, and hydrocortisone  has not provided relief. There are no new products in use, and the rash is not present on her upper body.  She has symptoms of itching and burning consistent with a UTI, which has not resolved since a recent hospital stay. A urine culture showed E. coli sensitive to multiple antibiotics and she was previously treated with antibiotics.      Past Medical History:  Diagnosis Date   Clotting disorder    on xarelto    Essential hypertension    Heart murmur    a. 10/2015 Echo: EF 60-65%, no rwma, mild AI/MR, sev dil LA/RA, PASP .   Noncompliance    NSVT (nonsustained ventricular tachycardia) (HCC)    a. 10/2015 during admission for CVA/AF.   Persistent atrial fibrillation (HCC)    a. CHA2DS2VASC = 4-->Xarelto ;  b. 02/2016 Successful DCCV.  c. Recurrent fib   Pneumonia 2012 x 2; 2015   Stroke [facility])    a. 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   Transient ischemic attack (TIA) 01/2013   Allergies  Allergen Reactions   Penicillins Hives and Other (See Comments)    Unknown  Has patient had a PCN reaction causing immediate rash, facial/tongue/throat swelling, SOB or lightheadedness with hypotension: No Has patient had a PCN reaction causing severe rash involving mucus membranes or skin necrosis: No Has patient had a PCN reaction that required hospitalization No Has patient had a PCN reaction occurring  within the last 10 years: No If all of the above answers are NO, then may proceed with Cephalosporin use.    Current Outpatient Medications on File Prior to Visit  Medication Sig Dispense Refill   Accu-Chek FastClix Lancets MISC USE AS INSTRUCTED TO CHECK BLOOD SUGAR ONCE DAILY. 102 each 0   acetaminophen  (TYLENOL ) 325 MG tablet Take 1-2 tablets  (325-650 mg total) by mouth every 4 (four) hours as needed for mild pain (pain score 1-3).     baclofen (LIORESAL) 10 MG tablet Take 1 tablet (10 mg total) by mouth 3 (three) times daily. 30 each 0   Blood Glucose Monitoring Suppl (ACCU-CHEK GUIDE ME) w/Device KIT 1 kit by Does not apply route daily. 1 kit 0   Continuous Glucose Receiver (FREESTYLE LIBRE 3 READER) DEVI Apply as directed to test blood glucose 1 each 0   Continuous Glucose Sensor (FREESTYLE LIBRE 3 SENSOR) MISC Place 1 sensor on the skin every 14 days. Use to check glucose continuously 3 each 11   feeding supplement (ENSURE PLUS HIGH PROTEIN) LIQD Take 237 mLs by mouth daily. 7110 mL 0   glucose blood test strip Use as instructed 100 strip 0   metoprolol  (TOPROL -XL) 200 MG 24 hr tablet Take 1 tablet (200 mg total) by mouth daily. Take with or immediately following a meal. 90 tablet 1   Nystatin (GERHARDT'S BUTT CREAM) CREA Apply 1 Application topically 3 (three) times daily. 60 each 0   polyethylene glycol powder (GLYCOLAX /MIRALAX ) 17 GM/SCOOP powder Dissolve 1 capful (17g) in 4-8 ounces of liquid and take by mouth daily. 238 g 0   potassium chloride  (KLOR-CON  M) 10 MEQ tablet Take 1 tablet (10 mEq total) by mouth daily. 90 tablet 1   rivaroxaban  (XARELTO ) 20 MG TABS tablet Take 1 tablet (20 mg total) by mouth daily with supper. 90 tablet 1   rosuvastatin  (CRESTOR ) 40 MG tablet Take 1 tablet (40 mg total) by mouth daily. 90 tablet 1   senna-docusate (SENOKOT-S) 8.6-50 MG tablet Take 1 tablet by mouth at bedtime. 30 tablet 0   spironolactone  (ALDACTONE ) 25 MG tablet Take 0.5 tablets (12.5 mg total) by mouth daily. 45 tablet 1   [DISCONTINUED] albuterol  (PROVENTIL  HFA;VENTOLIN  HFA) 108 (90 Base) MCG/ACT inhaler Inhale 2 puffs into the lungs every 6 (six) hours as needed for wheezing or shortness of breath. (Patient not taking: Reported on 03/28/2021) 1 Inhaler 2   [DISCONTINUED] fluticasone  (FLONASE ) 50 MCG/ACT nasal spray Place 2 sprays  into both nostrils daily. (Patient not taking: Reported on 03/28/2021) 16 g 6   No current facility-administered medications on file prior to visit.    ROS: See HPI  Observations/Objective: Awake, alert, oriented x3 Not in acute distress Normal mood      Latest Ref Rng & Units 06/15/2024   11:07 AM 06/06/2024    5:04 AM 06/03/2024    5:10 AM  CMP  Glucose 70 - 99 mg/dL 95  93    BUN 8 - 27 mg/dL 8  9    Creatinine 9.42 - 1.00 mg/dL 9.04  8.76    Sodium 865 - 144 mmol/L 142  141    Potassium 3.5 - 5.2 mmol/L 4.3  3.8  3.6   Chloride 96 - 106 mmol/L 105  109    CO2 20 - 29 mmol/L 19  21    Calcium  8.7 - 10.3 mg/dL 89.8  9.3      Lipid Panel     Component Value Date/Time  CHOL 152 05/06/2024 0559   CHOL 201 (H) 04/13/2024 1621   TRIG 39 05/06/2024 0559   HDL 35 (L) 05/06/2024 0559   HDL 49 04/13/2024 1621   CHOLHDL 4.3 05/06/2024 0559   VLDL 8 05/06/2024 0559   LDLCALC 109 (H) 05/06/2024 0559   LDLCALC 140 (H) 04/13/2024 1621   LABVLDL 12 04/13/2024 1621    Lab Results  Component Value Date   HGBA1C 6.1 (H) 05/05/2024     Assessment and plan:   Assessment & Plan Hemiparesis affecting left side as late effect of stroke Stroke with residual weakness and neuropathic pain Chronic neuropathic pain and residual weakness post-stroke. Pain affects arms and back, with sleep disturbances and irritability. Jerking sensations in foot likely due to abnormal nerve sensations post-stroke. - Prescribed gabapentin  300 mg once daily at night. Increase to twice daily if needed for pain control and if not causing excessive daytime sleepiness. - Documented need for hospital bed due to stroke-related weakness and pain management.  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  Acute cystitis Urinary tract infection due to E. coli Persistent UTI symptoms  post-hospitalization. Nitrofurantoin chosen based on sensitivity of E. coli. - Prescribed nitrofurantoin, one tablet twice daily for five days.  Dermatitis of bilateral lower extremities Rash on bilateral lower extremities, red and itchy. Hydrocortisone  was ineffective. Rash not present on upper body, reducing likelihood of medication allergy. - Prescribed triamcinolone  cream for rash and itching. - Monitor for spread to upper body, which may indicate allergic reaction to medication.  Aphthous ulcers of mouth Aphthous ulcers likely due to recent stress, acute illness and hospitalization. Ulcers cause discomfort when brushing teeth. - Prescribed viscous lidocaine  for oral use to numb ulcers during brushing. - Recommended over-the-counter vitamin B12 and vitamin C supplements to support immunity.     Meds ordered this encounter  Medications   gabapentin  (NEURONTIN ) 300 MG capsule    Sig: Take 1 capsule (300 mg total) by mouth at bedtime.    Dispense:  30 capsule    Refill:  3   triamcinolone  cream (KENALOG ) 0.1 %    Sig: Apply 1 Application topically 2 (two) times daily.    Dispense:  45 g    Refill:  1   lidocaine  (XYLOCAINE ) 2 % solution    Sig: Use as directed 15 mLs in the mouth or throat every 6 (six) hours as needed for mouth pain.    Dispense:  120 mL    Refill:  0   nitrofurantoin, macrocrystal-monohydrate, (MACROBID) 100 MG capsule    Sig: Take 1 capsule (100 mg total) by mouth 2 (two) times daily.    Dispense:  10 capsule    Refill:  0    Follow Up Instructions: Keep previously scheduled appointment   I discussed the assessment and treatment plan with the patient. The patient was provided an opportunity to ask questions and all were answered. The patient agreed with the plan and demonstrated an understanding of the instructions.   The patient was advised to call back or seek an in-person evaluation if the symptoms worsen or if the condition fails to improve as  anticipated.     I provided 14 minutes total of Telehealth time during this encounter including median intraservice time, reviewing previous notes, investigations, ordering medications, medical decision making, coordinating care and patient verbalized understanding at the end of the visit.     Shelia Kingsberry  Jag Lenz, MD, FAAFP. [facility] [place], [place] [phone]   07/06/2024, 5:19 PM

## 2024-07-07 ENCOUNTER — Other Ambulatory Visit: Payer: Self-pay

## 2024-07-07 ENCOUNTER — Other Ambulatory Visit: Payer: Self-pay | Admitting: *Deleted

## 2024-07-07 DIAGNOSIS — I10 Essential (primary) hypertension: Secondary | ICD-10-CM

## 2024-07-07 NOTE — Patient Outreach (Signed)
 Complex Care Management   Visit Note  07/07/2024  Name:  [name] MRN: [id number] DOB: [birth year]  Situation: Referral received for Complex Care Management related to SDOH Barriers:  Lack of essential utilities   Depression Financial Resource Strain and HTN I obtained verbal consent from Patient.  Visit completed with Patient  on the phone  Background:   Past Medical History:  Diagnosis Date   Clotting disorder    on xarelto    Essential hypertension    Heart murmur    a. 10/2015 Echo: EF 60-65%, no rwma, mild AI/MR, sev dil LA/RA, PASP .   Noncompliance    NSVT (nonsustained ventricular tachycardia) (HCC)    a. 10/2015 during admission for CVA/AF.   Persistent atrial fibrillation (HCC)    a. CHA2DS2VASC = 4-->Xarelto ;  b. 02/2016 Successful DCCV.  c. Recurrent fib   Pneumonia 2012 x 2; 2015   Stroke [facility])    a. 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   Transient ischemic attack (TIA) 01/2013    Assessment: Patient Reported Symptoms:  Cognitive Cognitive Status: Able to follow simple commands, Alert and oriented to person, place, and time, Insightful and able to interpret abstract concepts, Normal speech and language skills Cognitive/Intellectual Conditions Management [RPT]: None reported or documented in medical history or problem list   Health Maintenance Behaviors: Annual physical exam, Healthy diet, Stress management, Spiritual practice(s), Sleep adequate Healing Pattern: Average Health Facilitated by: Healthy diet, Prayer/meditation, Rest, Stress management  Neurological Neurological Review of Symptoms: Weakness Neurological Management Strategies: Adequate rest, Medical device, Medication therapy, Routine screening Neurological Self-Management Outcome: 3 (uncertain) Neurological Comment: Patient with recent CVA with left sided weakness  HEENT HEENT Symptoms Reported: No symptoms  reported HEENT Management Strategies: Adequate rest, Routine screening HEENT Self-Management Outcome: 4 (good)    Cardiovascular Cardiovascular Symptoms Reported: No symptoms reported Does patient have uncontrolled Hypertension?: No Cardiovascular Management Strategies: Adequate rest, Coping strategies, Routine screening, Medication therapy, Medical device Cardiovascular Self-Management Outcome: 3 (uncertain) Cardiovascular Comment: Encouraged patient to check blood pressure 3 times weekly and record readings.  Respiratory Respiratory Symptoms Reported: No symptoms reported Respiratory Management Strategies: Adequate rest Respiratory Self-Management Outcome: 4 (good)  Endocrine Endocrine Symptoms Reported: No symptoms reported Is patient diabetic?: Yes Is patient checking blood sugars at home?: No (Patient informed me that she is awaiting on Blood Glucose machine to be delivered to her home.  Encouraged patient to take blood sugar twice daily when the blood sugar meter arrives to her home.) Endocrine Self-Management Outcome: 3 (uncertain)  Gastrointestinal Gastrointestinal Symptoms Reported: Incontinence, Constipation Additional Gastrointestinal Details: Patient reports occassional incontinence.  Patient is on Senokot  and Miralax  to help with with constipation.  Patient reports that she has incontinence since having the stroke.  She has ordered incontienence supplies and awaiting on delivery. Gastrointestinal Management Strategies: Adequate rest, Incontinence garment/pad, Coping strategies, Medication therapy Gastrointestinal Self-Management Outcome: 3 (uncertain)    Genitourinary Genitourinary Symptoms Reported: Incontinence Additional Genitourinary Details: Patient reports that she was recently treated for UTI. Genitourinary Management Strategies: Adequate rest, Medication therapy, Coping strategies, Incontinence garment/pad Genitourinary Self-Management Outcome: 3 (uncertain)   Integumentary Integumentary Symptoms Reported: Rash Additional Integumentary Details: Patient reported that she has developed a redden rash to her legs and her thighs.  She reports that she is using Triamcinolone  cream to help with the rash. Skin Management Strategies: Adequate rest, Coping strategies, Routine screening, Medication therapy Skin Self-Management Outcome: 3 (uncertain)  Musculoskeletal  Musculoskelatal Symptoms Reviewed: Limited mobility, Weakness, Difficulty walking Additional Musculoskeletal Details: Patient with recent stroke.  Patient reports that she uses a sit to stand lifter to get around the home.  She reports that she has has PT/OT/ Speech evaluations and  awaiting on [facility] to call back to schedule more visits.  Patient reports that she received in patient rehab after her Stroke.  Patient also uses the wheelchair to get around. Musculoskeletal Management Strategies: Adequate rest, Routine screening Musculoskeletal Self-Management Outcome: 3 (uncertain) Falls in the past year?: No Number of falls in past year: 1 or less Was there an injury with Fall?: No Fall Risk Category Calculator: 0 Patient Fall Risk Level: Low Fall Risk Patient at Risk for Falls Due to: Impaired mobility, No Fall Risks Fall risk Follow up: Falls evaluation completed, Education provided  Psychosocial Psychosocial Symptoms Reported: Depression - if selected complete PHQ 2-9 Behavioral Management Strategies: Support system, Adequate rest Behavioral Health Comment: Patient would like LCSW referral to speak about depression and possible resources and services Major Change/Loss/Stressor/Fears (CP): Medical condition, self Behaviors When Feeling Stressed/Fearful: Patient with recent Stroke. Has had a decline in her health. Techniques to Cope with Loss/Stress/Change: Spiritual practice(s), Diversional activities Quality of Family Relationships: helpful, involved, supportive Do you feel physically  threatened by others?: No    07/07/2024    PHQ2-9 Depression Screening   Little interest or pleasure in doing things Nearly every day  Feeling down, depressed, or hopeless Nearly every day  PHQ-2 - Total Score 6  Trouble falling or staying asleep, or sleeping too much More than half the days  Feeling tired or having little energy More than half the days  Poor appetite or overeating  Several days  Feeling bad about yourself - or that you are a failure or have let yourself or your family down More than half the days  Trouble concentrating on things, such as reading the newspaper or watching television Not at all  Moving or speaking so slowly that other people could have noticed.  Or the opposite - being so fidgety or restless that you have been moving around a lot more than usual Not at all  Thoughts that you would be better off dead, or hurting yourself in some way Not at all  PHQ2-9 Total Score 13  If you checked off any problems, how difficult have these problems made it for you to do your work, take care of things at home, or get along with other people Not difficult at all  Depression Interventions/Treatment Counseling (LCSW referral)    There were no vitals filed for this visit.  Medications Reviewed Today     Reviewed by Jorja Nichole LABOR, RN (Case Manager) on 07/07/24 at 1615  Med List Status: <None>   Medication Order Taking? Sig Documenting Provider Last Dose Status Informant  Accu-Chek FastClix Lancets MISC [id number]  USE AS INSTRUCTED TO CHECK BLOOD SUGAR ONCE DAILY. Newlin, Enobong, MD  Active Child, Pharmacy Records  acetaminophen  (TYLENOL ) 325 MG tablet [id number] Yes Take 1-2 tablets (325-650 mg total) by mouth every 4 (four) hours as needed for mild pain (pain score 1-3). Jerilynn Daphne SAILOR, NP  Active    Patient not taking:   Discontinued 03/28/21 1020 baclofen (LIORESAL) 10 MG tablet [id number] Yes Take 1 tablet (10 mg total) by mouth 3 (three) times daily. Newlin, Enobong,  MD  Active   Blood Glucose Monitoring Suppl (ACCU-CHEK GUIDE ME) w/Device KIT [id number]  1 kit by Does not apply route daily.  Delbert Clam, MD  Active Child, Pharmacy Records  Continuous Glucose Receiver (FREESTYLE LIBRE 3 READER) [place]IRID[place] [id number]  Apply as directed to test blood glucose Delbert Clam, MD  Active Child, Pharmacy Records  Continuous Glucose Sensor (FREESTYLE LIBRE 3 SENSOR) [place] [id number]  Place 1 sensor on the skin every 14 days. Use to check glucose continuously Newlin, Enobong, MD  Active Child, Pharmacy Records  feeding supplement (ENSURE PLUS HIGH PROTEIN) LIQD [id number]  Take 237 mLs by mouth daily.  Patient not taking: Reported on 07/07/2024   Jerilynn Daphne SAILOR, NP  Active    Patient not taking:   Discontinued 03/28/21 1020 gabapentin  (NEURONTIN ) 300 MG capsule [id number] Yes Take 1 capsule (300 mg total) by mouth at bedtime. Newlin, Enobong, MD  Active   glucose blood test strip [id number]  Use as instructed Newlin, Enobong, MD  Active Child, Pharmacy Records  lidocaine  (XYLOCAINE ) 2 % solution [id number] Yes Use as directed 15 mLs in the mouth or throat every 6 (six) hours as needed for mouth pain. Newlin, Enobong, MD  Active   metoprolol  (TOPROL -XL) 200 MG 24 hr tablet [id number] Yes Take 1 tablet (200 mg total) by mouth daily. Take with or immediately following a meal. Newlin, Enobong, MD  Active   nitrofurantoin, macrocrystal-monohydrate, (MACROBID) 100 MG capsule [id number] Yes Take 1 capsule (100 mg total) by mouth 2 (two) times daily. Newlin, Enobong, MD  Active   Nystatin (GERHARDT'S BUTT CREAM) CREA [id number] Yes Apply 1 Application topically 3 (three) times daily. Jerilynn Daphne SAILOR, NP  Active   polyethylene glycol powder (GLYCOLAX /MIRALAX ) 17 GM/SCOOP powder [id number] Yes Dissolve 1 capful (17g) in 4-8 ounces of liquid and take by mouth daily. Jerilynn Daphne SAILOR, NP  Active   potassium chloride  (KLOR-CON  M) 10 MEQ tablet [id number] Yes Take 1 tablet (10 mEq total) by  mouth daily. Newlin, Enobong, MD  Active   rivaroxaban  (XARELTO ) 20 MG TABS tablet [id number] Yes Take 1 tablet (20 mg total) by mouth daily with supper. Newlin, Enobong, MD  Active   rosuvastatin  (CRESTOR ) 40 MG tablet [id number] Yes Take 1 tablet (40 mg total) by mouth daily. Newlin, Enobong, MD  Active   senna-docusate (SENOKOT-S) 8.6-50 MG tablet [id number] Yes Take 1 tablet by mouth at bedtime. Jerilynn Daphne SAILOR, NP  Active   spironolactone  (ALDACTONE ) 25 MG tablet [id number] Yes Take 0.5 tablets (12.5 mg total) by mouth daily. Newlin, Enobong, MD  Active   triamcinolone  cream (KENALOG ) 0.1 % [id number] Yes Apply 1 Application topically 2 (two) times daily. Newlin, Enobong, MD  Active             Recommendation:   PCP Follow-up Specialty provider follow-up :Cardiology-07/08/24; Physician Medicine-08/05/24; Neurology-08/08/24 Continue Current Plan of Care  Follow Up Plan:   Telephone follow-up 2 weeks- 07/22/24 @ 3 pm  Ansh Fauble, RN, BSN, [employer] RN Care Manager [employer] [phone]

## 2024-07-07 NOTE — Patient Instructions (Signed)
 Visit Information  [name] was given information about Medicaid Managed Care team care coordination services as a part of their [facility] Community Plan Medicaid benefit.   If you would like to schedule transportation through your [facility], please call the following number at least 2 days in advance of your appointment: [phone]   Rides for urgent appointments can also be made after hours by calling Member Services.  Call the Behavioral Health Crisis Line at [phone], at any time, 24 hours a day, 7 days a week. If you are in danger or need immediate medical attention call 911.  Please see education materials related to HTN provided by MyChart link.  Care plan and visit instructions communicated with the patient verbally today. Patient agrees to receive a copy in MyChart. Active MyChart status and patient understanding of how to access instructions and care plan via MyChart confirmed with patient.     Telephone follow up appointment with Managed Medicaid care management team member scheduled for: 07/22/24 @ 3 pm  Elizabeth Schnackenberg, RN, BSN, [facility] RN Care Manager [employer] [phone]   [name] was given information about Medicaid Managed Care team care coordination services as a part of their [facility] Community Plan Medicaid benefit.   If you would like to schedule transportation through your [facility], please call the following number at least 2 days in advance of your appointment: [phone]   Rides for urgent appointments can also be made after hours by calling Member Services.  Call the Behavioral Health Crisis Line at [phone], at any time, 24 hours a day, 7 days a week. If you are in danger or need immediate medical attention call 911.  Please see education materials related to HTN provided by MyChart link.  Care plan and visit instructions communicated with the patient verbally today. Patient  agrees to receive a copy in MyChart. Active MyChart status and patient understanding of how to access instructions and care plan via MyChart confirmed with patient.     Telephone follow up appointment with Managed Medicaid care management team member scheduled for: 07/22/24 @ 3 pm  Elizabeth Kipp, RN, BSN, [facility] RN Care Manager [facility] [facility] [phone]    Visit Information  [name] was given information about Medicaid Managed Care team care coordination services as a part of their [facility] Community Plan Medicaid benefit.   If you would like to schedule transportation through your [facility], please call the following number at least 2 days in advance of your appointment: [phone]   Rides for urgent appointments can also be made after hours by calling Member Services.  Call the Behavioral Health Crisis Line at [phone], at any time, 24 hours a day, 7 days a week. If you are in danger or need immediate medical attention call 911.  Please see education materials related to HTN provided by MyChart link.  Care plan and visit instructions communicated with the patient verbally today. Patient agrees to receive a copy in MyChart. Active MyChart status and patient understanding of how to access instructions and care plan via MyChart confirmed with patient.     Telephone follow up appointment with Managed Medicaid care management team member scheduled for: 07/22/24 @ 3 pm  Elizabeth Boddy, RN, BSN, [facility] RN Care Manager [facility] Population [facility] [phone]    Visit Information  [name] was given information about Medicaid Managed Care team care coordination services as a part of their  [employer] Community Plan Medicaid benefit.   If you would like to schedule transportation through your [facility], please call the following number at least 2 days in advance of your appointment: [phone]   Rides for urgent appointments  can also be made after hours by calling Member Services.  Call the Behavioral Health Crisis Line at [phone], at any time, 24 hours a day, 7 days a week. If you are in danger or need immediate medical attention call 911.  Please see education materials related to HTN provided by MyChart link.  Care plan and visit instructions communicated with the patient verbally today. Patient agrees to receive a copy in MyChart. Active MyChart status and patient understanding of how to access instructions and care plan via MyChart confirmed with patient.     Telephone follow up appointment with Managed Medicaid care management team member scheduled for: 07/22/24 @ 3 pm  Elizabeth Langan, RN, BSN, Theatre manager [employer] [phone]

## 2024-07-08 ENCOUNTER — Encounter: Payer: Self-pay | Admitting: Cardiovascular Disease

## 2024-07-08 ENCOUNTER — Telehealth: Payer: Self-pay | Admitting: Family Medicine

## 2024-07-08 ENCOUNTER — Ambulatory Visit: Attending: Cardiovascular Disease | Admitting: Cardiovascular Disease

## 2024-07-08 ENCOUNTER — Other Ambulatory Visit (HOSPITAL_COMMUNITY): Payer: Self-pay

## 2024-07-08 VITALS — BP 102/72 | HR 58 | Resp 17 | Ht 63.0 in | Wt 195.0 lb

## 2024-07-08 DIAGNOSIS — G4733 Obstructive sleep apnea (adult) (pediatric): Secondary | ICD-10-CM | POA: Insufficient documentation

## 2024-07-08 DIAGNOSIS — I5032 Chronic diastolic (congestive) heart failure: Secondary | ICD-10-CM | POA: Insufficient documentation

## 2024-07-08 DIAGNOSIS — I10 Essential (primary) hypertension: Secondary | ICD-10-CM | POA: Insufficient documentation

## 2024-07-08 DIAGNOSIS — D6869 Other thrombophilia: Secondary | ICD-10-CM | POA: Diagnosis not present

## 2024-07-08 DIAGNOSIS — E785 Hyperlipidemia, unspecified: Secondary | ICD-10-CM | POA: Diagnosis not present

## 2024-07-08 DIAGNOSIS — I4821 Permanent atrial fibrillation: Secondary | ICD-10-CM | POA: Insufficient documentation

## 2024-07-08 DIAGNOSIS — E119 Type 2 diabetes mellitus without complications: Secondary | ICD-10-CM | POA: Diagnosis not present

## 2024-07-08 DIAGNOSIS — G8194 Hemiplegia, unspecified affecting left nondominant side: Secondary | ICD-10-CM | POA: Insufficient documentation

## 2024-07-08 DIAGNOSIS — I2510 Atherosclerotic heart disease of native coronary artery without angina pectoris: Secondary | ICD-10-CM | POA: Insufficient documentation

## 2024-07-08 MED ORDER — METOPROLOL SUCCINATE ER 100 MG PO TB24
100.0000 mg | ORAL_TABLET | Freq: Every day | ORAL | 3 refills | Status: AC
  Filled 2024-07-08 – 2024-07-15 (×2): qty 90, 90d supply, fill #0
  Filled 2024-09-25: qty 90, 90d supply, fill #1

## 2024-07-08 MED ORDER — METOPROLOL SUCCINATE ER 50 MG PO TB24
50.0000 mg | ORAL_TABLET | Freq: Every day | ORAL | 3 refills | Status: AC
  Filled 2024-07-08 – 2024-07-15 (×2): qty 90, 90d supply, fill #0
  Filled 2024-09-25: qty 90, 90d supply, fill #1

## 2024-07-08 NOTE — Patient Instructions (Signed)
 Medication Instructions:  Stop taking potassium supplement Decrease Metoprolol  Succinate to 150 mg daily *If you need a refill on your cardiac medications before your next appointment, please call your pharmacy*  Lab Work: None ordered If you have labs (blood work) drawn today and your tests are completely normal, you will receive your results only by: MyChart Message (if you have MyChart) OR A paper copy in the mail If you have any lab test that is abnormal or we need to change your treatment, we will call you to review the results.  Testing/Procedures: None ordered  Follow-Up: At [facility], you and your health needs are our priority.  As part of our continuing mission to provide you with exceptional heart care, our providers are all part of one team.  This team includes your primary Cardiologist (physician) and Advanced Practice Providers or APPs (Physician Assistants and Nurse Practitioners) who all work together to provide you with the care you need, when you need it.  Your next appointment:   6 month(s)  Provider:   Jerel Balding, MD    We recommend signing up for the patient portal called MyChart.  Sign up information is provided on this After Visit Summary.  MyChart is used to connect with patients for Virtual Visits (Telemedicine).  Patients are able to view lab/test results, encounter notes, upcoming appointments, etc.  Non-urgent messages can be sent to your provider as well.   To learn more about what you can do with MyChart, go to [email or web address].

## 2024-07-08 NOTE — Telephone Encounter (Signed)
 Noted patient's daughter will be called to schedule video visit to complete paperwork.

## 2024-07-08 NOTE — Progress Notes (Signed)
 Cardiology Office Note:    Date:  07/10/2024   ID:  [name], DOB March 21, 1966, MRN [id number]  PCP:  Delbert Clam, MD   [facility] Providers Cardiologist:  Jerel Balding, MD     Referring MD: Delbert Clam, MD   Chief Complaint  Patient presents with   Chronic combined systolic and diastolic CHF (congestive hea   Follow-up    History of Present Illness:    [name] is a 58 y.o. 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  During her hospitalization for stroke her echocardiogram showed complete recovery of left ventricular systolic function with an EF of 60 to 65%.  She had mild LVH, moderate biatrial dilation, no serious valve abnormalities.  During that same hospitalization her ABI suggested moderate right lower extremity and mild left lower extremity obstructive disease with ABIs of 0.73 and 0.82 respectively.  She is in a wheelchair and has almost complete paresis of the left lower extremity and left upper extremity.  Has only mild residual facial asymmetry and no longer has any speech or swallowing issues.  She has not had any new neurological events and denies any cardiovascular complaints (denies dyspnea, angina, palpitations, dizziness or syncope, orthopnea or PND, lower extremity edema.  In order to achieve better compliance with medications we  simplified her medication regimen when she presented with her stroke and she has done pretty well on Xarelto  20 mg daily, metoprolol  succinate 200 mg daily and spironolactone  25 mg daily.  Her blood pressure is very well-controlled, in fact her blood pressure today is rather low at 102/72 and she is relatively bradycardic with a heart rate around 60 bpm.  She is no longer taking loop diuretics or SGLT2 inhibitors.  She reports using her CPAP and denies daytime hypersomnolence.    Past Medical History:  Diagnosis Date   Clotting disorder    on xarelto    Essential hypertension    Heart murmur    a. 10/2015 Echo: EF 60-65%, no rwma, mild AI/MR, sev dil LA/RA, PASP .   Noncompliance    NSVT (nonsustained ventricular tachycardia) (HCC)    a. 10/2015 during admission for CVA/AF.   Persistent atrial fibrillation (HCC)    a. CHA2DS2VASC = 4-->Xarelto ;  b. 02/2016 Successful DCCV.  c. Recurrent fib   Pneumonia 2012 x 2; 2015   Stroke [facility])    a. 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   Transient ischemic attack (TIA) 01/2013    Past Surgical History:  Procedure Laterality Date   CARDIOVERSION N/A 02/18/2013   Procedure: CARDIOVERSION;  Surgeon: Jerel Balding, MD;  Location: [facility] ENDOSCOPY;  Service: Cardiovascular;  Laterality: N/A;   CARDIOVERSION N/A 02/23/2013   Procedure: CARDIOVERSION;  Surgeon: Alm LELON Clay, MD;  Location: [facility] OR;  Service: Cardiovascular;  Laterality: N/A;  BESIDE CV   CARDIOVERSION N/A 03/12/2016   Procedure: CARDIOVERSION;  Surgeon: Ezra GORMAN Shuck, MD;  Location: [facility] ENDOSCOPY;  Service: Cardiovascular;  Laterality: N/A;   IR PERCUTANEOUS ART THROMBECTOMY/INFUSION INTRACRANIAL INC DIAG ANGIO  05/05/2024   IR US  GUIDE VASC ACCESS RIGHT  05/05/2024   RADIOLOGY WITH ANESTHESIA N/A 05/05/2024   Procedure: RADIOLOGY WITH ANESTHESIA;  Surgeon: Radiologist, Medication, MD;  Location: [facility] OR;   Service: Radiology;  Laterality: N/A;   RIGHT/LEFT HEART CATH AND CORONARY ANGIOGRAPHY N/A 04/02/2021   Procedure: RIGHT/LEFT HEART CATH AND CORONARY ANGIOGRAPHY;  Surgeon: Claudene Victory ORN, MD;  Location: [facility] INVASIVE CV LAB;  Service: Cardiovascular;  Laterality: N/A;   TEE WITHOUT CARDIOVERSION N/A 02/18/2013   Procedure: TRANSESOPHAGEAL ECHOCARDIOGRAM (TEE);  Surgeon: Jerel Balding, MD;  Location: [facility] ENDOSCOPY;  Service: Cardiovascular;  Laterality: N/A;   TUBAL LIGATION  1992    Current Medications: Current Meds  Medication Sig   Accu-Chek FastClix Lancets MISC USE AS INSTRUCTED TO CHECK BLOOD SUGAR ONCE DAILY.   acetaminophen  (TYLENOL ) 325 MG tablet Take 1-2 tablets (325-650 mg total) by mouth every 4 (four) hours as needed for mild pain (pain score 1-3).   baclofen (LIORESAL) 10 MG tablet Take 1 tablet (10 mg total) by mouth 3 (three) times daily.   Blood Glucose Monitoring Suppl (ACCU-CHEK GUIDE ME) w/Device KIT 1 kit by Does not apply route daily.   Continuous Glucose Receiver (FREESTYLE LIBRE 3 READER) DEVI Apply as directed to test blood glucose   Continuous Glucose Sensor (FREESTYLE LIBRE 3 SENSOR) MISC Place 1 sensor on the skin every 14 days. Use to check glucose continuously   feeding supplement (ENSURE PLUS HIGH PROTEIN) LIQD Take 237 mLs by mouth daily.   gabapentin  (NEURONTIN ) 300 MG capsule Take 1 capsule (300 mg total) by mouth at bedtime.   glucose blood test strip Use as instructed   lidocaine  (XYLOCAINE ) 2 % solution Use as directed 15 mLs in the mouth or throat every 6 (six) hours as needed for mouth pain.   metoprolol  succinate (TOPROL -XL) 50 MG 24 hr tablet Take 1 tablet (50 mg total) by mouth daily. Take with or immediately following a meal.   nitrofurantoin, macrocrystal-monohydrate, (MACROBID) 100 MG capsule Take 1 capsule (100 mg total) by mouth 2 (two) times daily.   Nystatin (GERHARDT'S BUTT CREAM) CREA Apply 1 Application topically 3 (three) times daily.    polyethylene glycol powder (GLYCOLAX /MIRALAX ) 17 GM/SCOOP powder Dissolve 1 capful (17g) in 4-8 ounces of liquid and take by mouth daily.   rivaroxaban  (XARELTO ) 20 MG TABS tablet Take 1 tablet (20 mg total) by mouth daily with supper.   rosuvastatin  (CRESTOR ) 40 MG tablet Take 1 tablet (40 mg total) by mouth daily.   senna-docusate (SENOKOT-S) 8.6-50 MG tablet Take 1 tablet by mouth at bedtime.   spironolactone  (ALDACTONE ) 25 MG tablet Take 0.5 tablets (12.5 mg total) by mouth daily.   triamcinolone  cream (KENALOG ) 0.1 % Apply 1 Application topically 2 (two) times daily.   [DISCONTINUED] metoprolol  (TOPROL -XL) 200 MG 24 hr tablet Take 1 tablet (200 mg total) by mouth daily. Take with or immediately following a meal.   [DISCONTINUED] potassium chloride  (KLOR-CON  M) 10 MEQ tablet Take 1 tablet (10 mEq total) by mouth daily.     Allergies:   Penicillins   Social History   Socioeconomic History   Marital status: Single    Spouse name: Not on file   Number of children: 7   Years of education: Not on file   Highest education level: Associate degree: occupational, scientist, product/process development, or vocational program  Occupational History   Occupation: [employer]  Employer: [employer]   Occupation: in home health aide  Tobacco Use   Smoking status: Former    Current packs/day: 0.00    Types: Cigarettes    Start date: 03/15/2010    Quit date: 03/15/2013    Years since quitting: 11.3   Smokeless tobacco: Never  Vaping Use   Vaping status: Never Used  Substance and Sexual Activity   Alcohol use: Yes    Comment: drinks on the weekends,   2-3 beers   Drug use: No   Sexual activity: Not Currently    Birth control/protection: None  Other Topics Concern   Not on file  Social History Narrative   Not on file   Social Drivers of Health   Financial Resource Strain: Low Risk  (11/18/2023)   Overall Financial Resource Strain (CARDIA)    Difficulty of Paying Living Expenses: Not hard at all  Food  Insecurity: No Food Insecurity (07/07/2024)   Hunger Vital Sign    Worried About Running Out of Food in the Last Year: Never true    Ran Out of Food in the Last Year: Never true  Transportation Needs: No Transportation Needs (07/07/2024)   PRAPARE - Administrator, Civil Service (Medical): No    Lack of Transportation (Non-Medical): No  Physical Activity: Unknown (11/18/2023)   Exercise Vital Sign    Days of Exercise per Week: 5 days    Minutes of Exercise per Session: Patient declined  Stress: Stress Concern Present (11/18/2023)   [employer] of Occupational Health - Occupational Stress Questionnaire    Feeling of Stress : Rather much  Social Connections: Unknown (11/18/2023)   Social Connection and Isolation Panel    Frequency of Communication with Friends and Family: Patient declined    Frequency of Social Gatherings with Friends and Family: Patient declined    Attends Religious Services: Patient declined    Database Administrator or Organizations: No    Attends Engineer, Structural: Not on file    Marital Status: Never married     Family History: The patient's family history includes Atrial fibrillation in her father and mother; Breast cancer in her mother; Cancer in her mother; Diabetes in an other family member; Hypertension in her father and another family member; Peripheral vascular disease in her father. There is no history of Heart attack, Stroke, Rectal cancer, Colon cancer, Esophageal cancer, or Stomach cancer.  ROS:   Please see the history of present illness.     All other systems reviewed and are negative.  EKGs/Labs/Other Studies Reviewed:    The following studies were reviewed today: Cardiac catheterization July 2022  60% mid LAD after second diagonal. 75% distal circumflex before small third obtuse marginal. Right dominant anatomy Mild pulmonary hypertension with mean pressure 31 mmHg, mean capillary wedge pressure 16 mmHg, pulmonary  vascular resistance 2.56 Wood units. Pulmonary artery O2 saturation 73%, cardiac output and index are 5.87 L/min and 3.01 L/min/m respectively.  Echo 05/05/2024           1. Left ventricular ejection fraction, by estimation, is 60 to 65%. The  left ventricle has normal function. The left ventricle has no regional  wall motion abnormalities. There is mild concentric left ventricular  hypertrophy. Left ventricular diastolic  function could not be evaluated.   2. Right ventricular systolic function is normal. The right ventricular  size is normal. There is mildly elevated pulmonary artery systolic  pressure. The estimated right ventricular systolic pressure is  41.0 mmHg.   3. Left atrial size was moderately dilated.   4. Right atrial size was moderately dilated.   5. The mitral valve is normal in structure. Trivial mitral valve  regurgitation. No evidence of mitral stenosis.   6. Tricuspid valve regurgitation is mild to moderate.   7. The aortic valve is tricuspid. There is mild calcification of the  aortic valve. Aortic valve regurgitation is mild. No aortic stenosis is  present.   8. The inferior vena cava is normal in size with greater than 50%  respiratory variability, suggesting right atrial pressure of 3 mmHg.   EKG:  EKG is not ordered today.  The ekg ordered   Recent Labs: 05/10/2024: ALT 19 06/06/2024: Hemoglobin 13.4; Platelets 151 06/15/2024: BUN 8; Creatinine, Ser 0.95; Potassium 4.3; Sodium 142  Recent Lipid Panel    Component Value Date/Time   CHOL 152 05/06/2024 0559   CHOL 201 (H) 04/13/2024 1621   TRIG 39 05/06/2024 0559   HDL 35 (L) 05/06/2024 0559   HDL 49 04/13/2024 1621   CHOLHDL 4.3 05/06/2024 0559   VLDL 8 05/06/2024 0559   LDLCALC 109 (H) 05/06/2024 0559   LDLCALC 140 (H) 04/13/2024 1621     Risk Assessment/Calculations:    CHA2DS2-VASc Score = 6   This indicates a 9.7% annual risk of stroke. The patient's score is based upon: CHF History: 1 HTN  History: 1 Diabetes History: 0 Stroke History: 2 Vascular Disease History: 1 Age Score: 0 Gender Score: 1           Physical Exam:    VS:  BP 102/72 (BP Location: Left Arm, Patient Position: Sitting, Cuff Size: Large)   Pulse (!) 58   Resp 17   Ht 5' 3 (1.6 m)   Wt 195 lb (88.5 kg)   SpO2 100%   BMI 34.54 kg/m     Wt Readings from Last 3 Encounters:  07/08/24 195 lb (88.5 kg)  05/29/24 188 lb 11.4 oz (85.6 kg)  05/05/24 203 lb (92.1 kg)     General: Alert, oriented x3, no distress, moderately obese Head: no evidence of trauma, PERRL, EOMI, no exophtalmos or lid lag, no myxedema, no xanthelasma; normal ears, nose and oropharynx Neck: normal jugular venous pulsations and no hepatojugular reflux; brisk carotid pulses without delay and no carotid bruits Chest: clear to auscultation, no signs of consolidation by percussion or palpation, normal fremitus, symmetrical and full respiratory excursions Cardiovascular: normal position and quality of the apical impulse, irregular rhythm, normal first and second heart sounds, no murmurs, rubs or gallops Abdomen: no tenderness or distention, no masses by palpation, no abnormal pulsatility or arterial bruits, normal bowel sounds, no hepatosplenomegaly Extremities: no clubbing, cyanosis or edema; 2+ radial, ulnar and brachial pulses bilaterally; 2+ right femoral, posterior tibial and dorsalis pedis pulses; 2+ left femoral, posterior tibial and dorsalis pedis pulses; no subclavian or femoral bruits Neurological: Dense left hemiparesis of both upper and lower extremity.  Mild left facial droop Psych: Normal mood and affect   ASSESSMENT:    1. Chronic diastolic heart failure (HCC)   2. Permanent atrial fibrillation (HCC)   3. Acquired thrombophilia   4. Coronary artery disease involving native coronary artery of native heart without angina pectoris   5. Type 2 diabetes mellitus without complication, without long-term current use of insulin   (HCC)   6. Hyperlipidemia LDL goal <70   7. Essential hypertension   8. OSA on CPAP   9. Left hemiparesis (HCC)  PLAN:    In order of problems listed above:  CHF: She has had complete recovery of LVEF after resolution of tachycardia related cardiomyopathy.  Likely still has diastolic dysfunction due to hypertension related LVH.  She is maintain euvolemic status with just a low-dose of aldosterone antagonist, without the need for SGLT2 inhibitors or loop diuretics.  No need for the additional potassium supplement as long as she is not taking loop diuretics. AFib: Permanent arrhythmia, well rate controlled (in fact relatively bradycardic).  Will reduce the dose of metoprolol  to 150 mg daily.   Anticoagulation: Well-tolerated without bleeding complications. CAD: Asymptomatic/no angina pectoris.  Moderate mid LAD stenosis and borderline significant distal left circumflex stenosis by previous coronary angiography.   PAD: Asymptomatic at rest.  Abnormal ABIs bilaterally. DM: Satisfactory glycemic control, currently not on any glucose lowering medications.  If she requires medicines for diabetes the first choice should be an SGLT2 inhibitor.  For compliance, we are trying to keep her medication list short. HLP: Switched to rosuvastatin  during recent hospitalization.  Currently on maximum dose.  Recheck her lipid profile in a few more months.  Target LDL less than 70. HTN: Relatively low blood pressure.  Reducing the dose of metoprolol  succinate. OSA: Using CPAP.  Denies daytime hypersomnolence. Left hemiparesis: She would probably benefit from a referral to outpatient physical therapy.  Asked her to contact her stroke specialist.           Medication Adjustments/Labs and Tests Ordered: Current medicines are reviewed at length with the patient today.  Concerns regarding medicines are outlined above.  No orders of the defined types were placed in this encounter.  Meds ordered this encounter   Medications   metoprolol  succinate (TOPROL -XL) 100 MG 24 hr tablet    Sig: Take 1 tablet (100 mg total) by mouth daily. Take with or immediately following a meal.    Dispense:  90 tablet    Refill:  3    Will have 2 prescriptions, one for 100 and one for 50 to equal 150 mg daily   metoprolol  succinate (TOPROL -XL) 50 MG 24 hr tablet    Sig: Take 1 tablet (50 mg total) by mouth daily. Take with or immediately following a meal.    Dispense:  90 tablet    Refill:  3    Will have 2 prescriptions, one for 100 and one for 50 to equal 150 mg daily     Patient Instructions  Medication Instructions:  Stop taking potassium supplement Decrease Metoprolol  Succinate to 150 mg daily *If you need a refill on your cardiac medications before your next appointment, please call your pharmacy*  Lab Work: None ordered If you have labs (blood work) drawn today and your tests are completely normal, you will receive your results only by: MyChart Message (if you have MyChart) OR A paper copy in the mail If you have any lab test that is abnormal or we need to change your treatment, we will call you to review the results.  Testing/Procedures: None ordered  Follow-Up: At [facility], you and your health needs are our priority.  As part of our continuing mission to provide you with exceptional heart care, our providers are all part of one team.  This team includes your primary Cardiologist (physician) and Advanced Practice Providers or APPs (Physician Assistants and Nurse Practitioners) who all work together to provide you with the care you need, when you need it.  Your next appointment:   6 month(s)  Provider:   Jerel Balding, MD    We recommend signing up for the patient portal called MyChart.  Sign up information is provided on this After Visit Summary.  MyChart is used to connect with patients for Virtual Visits (Telemedicine).  Patients are able to view lab/test results, encounter notes,  upcoming appointments, etc.  Non-urgent messages can be sent to your provider as well.   To learn more about what you can do with MyChart, go to [email or web address].      Signed, Jerel Balding, MD  07/10/2024 12:00 PM    [facility]

## 2024-07-08 NOTE — Telephone Encounter (Signed)
 Patent's daughter came in the office today, and dropped off Request for Leave of Absence paperwork. Paperwork has been placed in the provider's box for review.

## 2024-07-10 DIAGNOSIS — I4821 Permanent atrial fibrillation: Secondary | ICD-10-CM | POA: Insufficient documentation

## 2024-07-11 ENCOUNTER — Telehealth: Payer: Self-pay

## 2024-07-11 NOTE — Telephone Encounter (Signed)
 [facility] has been called and informed that patient currently has incontinence order through [employer].

## 2024-07-11 NOTE — Telephone Encounter (Signed)
 Copied from CRM #[id number]. Topic: General - Other >> Jul 11, 2024 12:08 PM Joesph NOVAK wrote:  Reason for CRM: [facility] is calling to follow up on Physicians orders for Incontinence supplies and diabetic. She needs the diabetic order sent back with initials and date changes. I advised her we did not receive the order for incontinence and if she could fax it again.

## 2024-07-11 NOTE — Progress Notes (Unsigned)
 Complex Care Management Note Care Guide Note  07/11/2024 Name: [name] MRN: [id number] DOB: [birth year]   Complex Care Management Outreach Attempts: An unsuccessful telephone outreach was attempted today to offer the patient information about available complex care management services.  Follow Up Plan:  Additional outreach attempts will be made to offer the patient complex care management information and services.   Encounter Outcome:  No Answer-Call picked up but no one answered. Unable to leave a message  Leotis Rase [facility] Guide  Direct Dial : [phone]  Fax [phone]

## 2024-07-12 NOTE — Progress Notes (Signed)
 Complex Care Management Note  Care Guide Note 07/12/2024 Name: [name] MRN: [id number] DOB: [birth year]  [name] is a 58 y.o. year old female who sees Newlin, Enobong, MD for primary care. I reached out to [name] by phone today to offer complex care management services.  [name] was given information about Complex Care Management services today including:   The Complex Care Management services include support from the care team which includes your Nurse Care Manager, Clinical Social Worker, or Pharmacist.  The Complex Care Management team is here to help remove barriers to the health concerns and goals most important to you. Complex Care Management services are voluntary, and the patient may decline or stop services at any time by request to their care team member.   Complex Care Management Consent Status: Patient agreed to services and verbal consent obtained.   Follow up plan:  Telephone appointment with complex care management team member scheduled for:  07/28/24 @ 1 PM   Encounter Outcome:  Patient Scheduled  Leotis Rase [facility] Guide  Direct Dial : [phone]  Fax [phone]

## 2024-07-13 DIAGNOSIS — G8929 Other chronic pain: Secondary | ICD-10-CM | POA: Diagnosis not present

## 2024-07-13 DIAGNOSIS — I69354 Hemiplegia and hemiparesis following cerebral infarction affecting left non-dominant side: Secondary | ICD-10-CM | POA: Diagnosis not present

## 2024-07-15 ENCOUNTER — Telehealth: Payer: Self-pay | Admitting: Physical Medicine & Rehabilitation

## 2024-07-15 ENCOUNTER — Encounter: Payer: Self-pay | Admitting: Neurology

## 2024-07-15 ENCOUNTER — Other Ambulatory Visit: Payer: Self-pay

## 2024-07-15 NOTE — Telephone Encounter (Signed)
 [name] with [employer] sent paperwork to our office for [name] to fill out for power wheelchair.  [name] didn't do a face to face with this patient.  This was ordered by social worker Asberry Dupree prior to patient leaving hospital.

## 2024-07-19 ENCOUNTER — Telehealth: Payer: Self-pay | Admitting: Family Medicine

## 2024-07-19 NOTE — Telephone Encounter (Signed)
 Copied from CRM #[id number]. Topic: Clinical - Order For Equipment >> Jul 19, 2024  8:41 AM Tobias CROME wrote:  Reason for CRM: Rosleyn with [facility] following up on request for diabetic and incontinence supplies. They sent two different forms, one for each supply.   Requesting callback:[phone]

## 2024-07-20 ENCOUNTER — Other Ambulatory Visit: Payer: Self-pay

## 2024-07-20 NOTE — Telephone Encounter (Signed)
 Form placed on PCP date for corrections.

## 2024-07-20 NOTE — Telephone Encounter (Signed)
 [facility] was called and informed that patient will be receiving incontinence supplies through [employer]. I informed her that the DM supply order was sent over on 07/08/24, she states that the form is needing PCP initial where date was corrected.

## 2024-07-20 NOTE — Patient Outreach (Signed)
 Social Drivers of Health  Community Resource and Care Coordination Visit Note   07/20/2024  Name: [name] MRN: [id number] DOB:01/31/1966  Situation: Referral received for [facility] needs assessment and assistance related to utility assistance need, dental need, home-health need and physical therapy rehab. . I obtained verbal consent from Patient.  Visit completed with Patient on the phone.   Background:   SDOH Interventions Today    Flowsheet Row Most Recent Value  SDOH Interventions   Food Insecurity Interventions Intervention Not Indicated  Housing Interventions Intervention Not Indicated  Transportation Interventions Intervention Not Indicated  Utilities Interventions Community Resources Provided  [BSW provided utility assistance resource.s]  Financial Strain Interventions Intervention Not Indicated     Assessment:   Goals Addressed             This Visit's Progress    BSW goal       Current SDOH Barriers:  Financial constraints related to recent stroke Utility resources Dental need Home-health need Rehab physical therapy need  Interventions: Patient interviewed and appropriate screenings performed Referred patient to community resources  Provided patient with information about utility assistance resources in the community, BSW will provide patient list of dental providers that accept medicaid.           Recommendation:   attend all scheduled provider appointments call for transportation assistance at least one week before appointments ask for help if you don't understand your health insurance benefits  Follow Up Plan:   Telephone follow up appointment date/time:  08/03/2024 at 1pm  [name], BSW [place]/VBCI - [facility] Social Worker [phone]

## 2024-07-20 NOTE — Patient Instructions (Signed)
 Visit Information  [name] was given information about Medicaid Managed Care team care coordination services as a part of their [facility] Community Plan Medicaid benefit.   If you would like to schedule transportation through your [facility], please call the following number at least 2 days in advance of your appointment: [phone]   Rides for urgent appointments can also be made after hours by calling Member Services.  Call the Behavioral Health Crisis Line at [phone], at any time, 24 hours a day, 7 days a week. If you are in danger or need immediate medical attention call 911.  Please see education materials related to dental provider list, utility assistance resources, and Medicaid Transportation provided by mail.   Care plan and visit instructions communicated with the patient verbally today. Patient agrees to receive a copy in MyChart. Active MyChart status and patient understanding of how to access instructions and care plan via MyChart confirmed with patient.     Telephone follow up appointment with Managed Medicaid care management team member scheduled for: 08/03/2024 at 1pm.  [name], BSW [place]/VBCI - [facility] Social Worker [phone]   Following is a copy of your plan of care:  There are no care plans that you recently modified to display for this patient.

## 2024-07-21 ENCOUNTER — Telehealth (INDEPENDENT_AMBULATORY_CARE_PROVIDER_SITE_OTHER): Payer: Self-pay | Admitting: Family Medicine

## 2024-07-21 DIAGNOSIS — E119 Type 2 diabetes mellitus without complications: Secondary | ICD-10-CM | POA: Diagnosis not present

## 2024-07-21 NOTE — Telephone Encounter (Signed)
 Copied from CRM #[id number]. Topic: Clinical - Order For Equipment >> Jul 21, 2024  2:59 PM Delon T wrote: Reason for CRM: follow up for the resting hand splint and glucometer- Signe Edison with [facility] [phone]

## 2024-07-22 ENCOUNTER — Other Ambulatory Visit: Payer: Self-pay | Admitting: *Deleted

## 2024-07-22 ENCOUNTER — Other Ambulatory Visit: Payer: Self-pay

## 2024-07-22 DIAGNOSIS — R32 Unspecified urinary incontinence: Secondary | ICD-10-CM | POA: Diagnosis not present

## 2024-07-22 DIAGNOSIS — E119 Type 2 diabetes mellitus without complications: Secondary | ICD-10-CM | POA: Diagnosis not present

## 2024-07-22 NOTE — Patient Instructions (Signed)
 Visit Information  [name] was given information about Medicaid Managed Care team care coordination services as a part of their [facility] Community Plan Medicaid benefit.   If you would like to schedule transportation through your [facility], please call the following number at least 2 days in advance of your appointment: [phone]   Rides for urgent appointments can also be made after hours by calling Member Services.  Call the Behavioral Health Crisis Line at [phone], at any time, 24 hours a day, 7 days a week. If you are in danger or need immediate medical attention call 911.  Please see education materials related to HTN provided by MyChart link.  Care plan and visit instructions communicated with the patient verbally today. Patient agrees to receive a copy in MyChart. Active MyChart status and patient understanding of how to access instructions and care plan via MyChart confirmed with patient.     Telephone follow up appointment with Managed Medicaid care management team member scheduled for: 08/30/24 @ 1:30 pm  Griselda Bramblett, RN, BSN, ACM RN Care Manager [employer] [phone]

## 2024-07-22 NOTE — Patient Outreach (Signed)
 Complex Care Management   Visit Note  07/22/2024  Name:  [name] MRN: [id number] DOB: 02-18-1966  Situation: Referral received for Complex Care Management related to HTN I obtained verbal consent from Patient.  Visit completed with Patient  on the phone  Background:   Past Medical History:  Diagnosis Date   Clotting disorder    on xarelto    Essential hypertension    Heart murmur    a. 10/2015 Echo: EF 60-65%, no rwma, mild AI/MR, sev dil LA/RA, PASP .   Noncompliance    NSVT (nonsustained ventricular tachycardia) (HCC)    a. 10/2015 during admission for CVA/AF.   Persistent atrial fibrillation (HCC)    a. CHA2DS2VASC = 4-->Xarelto ;  b. 02/2016 Successful DCCV.  c. 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   Transient ischemic attack (TIA) 01/2013    Assessment: Patient Reported Symptoms:  Cognitive Cognitive Status: Able to follow simple commands, Alert and oriented to person, place, and time, Insightful and able to interpret abstract concepts, Normal speech and language skills Cognitive/Intellectual Conditions Management [RPT]: None reported or documented in medical history or problem list   Health Maintenance Behaviors: Annual physical exam, Healthy diet, Spiritual practice(s), Sleep adequate Healing Pattern: Average Health Facilitated by: Healthy diet, Prayer/meditation, Rest, Stress management  Neurological   Neurological Management Strategies: Adequate rest, Medication therapy, Medical device, Routine screening Neurological Self-Management Outcome: 3 (uncertain) Neurological Comment: Patient with recent CVA and left sided weakness  HEENT HEENT Symptoms Reported: No symptoms reported HEENT Management Strategies: Adequate rest, Routine screening HEENT Self-Management Outcome: 4 (good)    Cardiovascular Cardiovascular  Symptoms Reported: No symptoms reported Does patient have uncontrolled Hypertension?: No Cardiovascular Management Strategies: Adequate rest, Coping strategies, Routine screening, Medical device, Medication therapy Cardiovascular Comment: Patient reports that her blood pressure was checked 2 times today.  She reports that the top number were 108 and 116 but she is unsure of the bottom numbers.  Encouraged patient to write readings down in a log.  Patient reports that she does have a blood pressure cuff at home.  Respiratory Respiratory Symptoms Reported: No symptoms reported Respiratory Management Strategies: Adequate rest  Endocrine Endocrine Symptoms Reported: No symptoms reported Is patient diabetic?: Yes Is patient checking blood sugars at home?: No Endocrine Self-Management Outcome: 3 (uncertain) Endocrine Comment: Patient reports that she is awaiting diabetic supplies to be shipped to her home and she reports that she will start checking blood sugar at home.  Gastrointestinal Gastrointestinal Symptoms Reported: Constipation, Incontinence Additional Gastrointestinal Details: Patient reports incontinence since her having a stroke.  Patient reports that incontience supplies have been ordered and she is awaiting deliver from [employer].  Per chart review [employer] has orders for DME supplies and incontinence supplies. Gastrointestinal Management Strategies: Adequate rest, Incontinence garment/pad, Coping strategies, Medication therapy Gastrointestinal Self-Management Outcome: 3 (uncertain)    Genitourinary Genitourinary Symptoms Reported: No symptoms reported Genitourinary Management Strategies: Adequate rest, Medication therapy, Coping strategies, Incontinence garment/pad Genitourinary Self-Management Outcome: 4 (good)  Integumentary Integumentary Symptoms Reported: Rash, No symptoms reported Skin Management Strategies: Adequate rest, Coping strategies, Routine screening, Medication therapy Skin  Self-Management Outcome: 4 (good)  Musculoskeletal Musculoskelatal Symptoms Reviewed: Difficulty walking, Weakness, Limited mobility Additional Musculoskeletal Details: Patient with recent stroke.  Patient reports that she continues to use sit to stand lifter to get around her home.  She reports  that she has had  PT/OT/Speech Visit via [facility].  Patient also reports that she continues to use wheelchair .  Patient inquired about SNF, v/s Outpatient Therapy.  Discuss the difference and will reach out with BSW to assist with placement conversations. Musculoskeletal Management Strategies: Adequate rest, Routine screening Musculoskeletal Self-Management Outcome: 3 (uncertain) Falls in the past year?: No Number of falls in past year: 1 or less Patient at Risk for Falls Due to: Impaired mobility, No Fall Risks Fall risk Follow up: Falls evaluation completed, Education provided  Psychosocial Psychosocial Symptoms Reported: Depression - if selected complete PHQ 2-9 Behavioral Health Comment: LCSW referral has been made to speak with patient about depression and resources and services. Major Change/Loss/Stressor/Fears (CP): Medical condition, self Behaviors When Feeling Stressed/Fearful: Patient will recent stroke.  Has had a decline in her health. Techniques to Cope with Loss/Stress/Change: Counseling, Diversional activities Quality of Family Relationships: helpful, involved, supportive Do you feel physically threatened by others?: No    07/22/2024    PHQ2-9 Depression Screening   Little interest or pleasure in doing things Nearly every day  Feeling down, depressed, or hopeless Nearly every day  PHQ-2 - Total Score 6  Trouble falling or staying asleep, or sleeping too much More than half the days  Feeling tired or having little energy More than half the days  Poor appetite or overeating  Several days  Feeling bad about yourself - or that you are a failure or have let yourself or your family down  More than half the days  Trouble concentrating on things, such as reading the newspaper or watching television Not at all  Moving or speaking so slowly that other people could have noticed.  Or the opposite - being so fidgety or restless that you have been moving around a lot more than usual Not at all  Thoughts that you would be better off dead, or hurting yourself in some way Not at all  PHQ2-9 Total Score 13  If you checked off any problems, how difficult have these problems made it for you to do your work, take care of things at home, or get along with other people Not difficult at all  Depression Interventions/Treatment Counseling    There were no vitals filed for this visit.  Medications Reviewed Today     Reviewed by Jorja Nichole LABOR, RN (Case Manager) on 07/22/24 at 1518  Med List Status: <None>   Medication Order Taking? Sig Documenting Provider Last Dose Status Informant  Accu-Chek FastClix Lancets MISC [id number] Yes USE AS INSTRUCTED TO CHECK BLOOD SUGAR ONCE DAILY. Newlin, Enobong, MD  Active Child, Pharmacy Records  acetaminophen  (TYLENOL ) 325 MG tablet [id number] Yes Take 1-2 tablets (325-650 mg total) by mouth every 4 (four) hours as needed for mild pain (pain score 1-3). Jerilynn Daphne SAILOR, NP  Active    Patient not taking:   Discontinued 03/28/21 1020 baclofen (LIORESAL) 10 MG tablet [id number] Yes Take 1 tablet (10 mg total) by mouth 3 (three) times daily. Newlin, Enobong, MD  Active   Blood Glucose Monitoring Suppl (ACCU-CHEK GUIDE ME) w/Device KIT [id number]  1 kit by Does not apply route daily. Delbert Clam, MD  Active Child, Pharmacy Records  Continuous Glucose Receiver (FREESTYLE LIBRE 3 READER) ESPIRIDION [id number]  Apply as directed to test blood glucose Delbert Clam, MD  Active Child, Pharmacy Records  Continuous Glucose Sensor (FREESTYLE LIBRE 3 SENSOR) [place] [id number]  Place 1 sensor on the skin every 14 days. Use to check glucose continuously  Newlin, Enobong, MD  Active  Child, Pharmacy Records  feeding supplement (ENSURE PLUS HIGH PROTEIN) LIQD [id number] Yes Take 237 mLs by mouth daily. Jerilynn Daphne SAILOR, NP  Active    Patient not taking:   Discontinued 03/28/21 1020 gabapentin  (NEURONTIN ) 300 MG capsule [id number] Yes Take 1 capsule (300 mg total) by mouth at bedtime. Newlin, Enobong, MD  Active   glucose blood test strip [id number]  Use as instructed Newlin, Enobong, MD  Active Child, Pharmacy Records  lidocaine  (XYLOCAINE ) 2 % solution [id number] Yes Use as directed 15 mLs in the mouth or throat every 6 (six) hours as needed for mouth pain. Newlin, Enobong, MD  Active   metoprolol  succinate (TOPROL -XL) 100 MG 24 hr tablet [id number] Yes Take 1 tablet (100 mg total) by mouth daily. Take with or immediately following a meal. Take with 50mg  to equal 150mg  daily. Croitoru, Mihai, MD  Active   metoprolol  succinate (TOPROL -XL) 50 MG 24 hr tablet [id number]  Take 1 tablet (50 mg total) by mouth daily. Take with or immediately following a meal.Take with 100mg  to equal 150mg  daily. Croitoru, Mihai, MD  Active   nitrofurantoin, macrocrystal-monohydrate, (MACROBID) 100 MG capsule [id number] Yes Take 1 capsule (100 mg total) by mouth 2 (two) times daily. Newlin, Enobong, MD  Active   Nystatin (GERHARDT'S BUTT CREAM) CREA [id number] Yes Apply 1 Application topically 3 (three) times daily. Jerilynn Daphne SAILOR, NP  Active   polyethylene glycol powder (GLYCOLAX /MIRALAX ) 17 GM/SCOOP powder [id number] Yes Dissolve 1 capful (17g) in 4-8 ounces of liquid and take by mouth daily. Jerilynn Daphne SAILOR, NP  Active   rivaroxaban  (XARELTO ) 20 MG TABS tablet [id number] Yes Take 1 tablet (20 mg total) by mouth daily with supper. Newlin, Enobong, MD  Active   rosuvastatin  (CRESTOR ) 40 MG tablet [id number] Yes Take 1 tablet (40 mg total) by mouth daily. Newlin, Enobong, MD  Active   senna-docusate (SENOKOT-S) 8.6-50 MG tablet [id number] Yes Take 1 tablet by mouth at bedtime. Jerilynn Daphne SAILOR, NP  Active    spironolactone  (ALDACTONE ) 25 MG tablet [id number] Yes Take 0.5 tablets (12.5 mg total) by mouth daily. Newlin, Enobong, MD  Active   triamcinolone  cream (KENALOG ) 0.1 % [id number] Yes Apply 1 Application topically 2 (two) times daily. Newlin, Enobong, MD  Active             Recommendation:   PCP Follow-up Specialty provider follow-up : Rehab Medicine-08/05/24; Neurology-08/08/24  Continue Current Plan of Care  Follow Up Plan:   Telephone follow-up in 1 month: 08/30/24 @ 1:30 pm  Niah Heinle, RN, BSN, ACM RN Care Manager [employer] [phone]

## 2024-07-22 NOTE — Telephone Encounter (Signed)
 Attempted to contact patient and verify, no answer.

## 2024-07-25 ENCOUNTER — Telehealth: Payer: Self-pay

## 2024-07-25 ENCOUNTER — Other Ambulatory Visit: Payer: Self-pay

## 2024-07-25 MED ORDER — MISC. DEVICES MISC: 0 refills | Status: AC

## 2024-07-25 NOTE — Telephone Encounter (Signed)
 Copied from CRM [id number]. Topic: Clinical - Order For Equipment >> Jul 25, 2024 12:43 PM [name] wrote: Reason for CRM: [name] with Home care delivered, calling to verify order for incontinence supplies was received, refaxing

## 2024-07-25 NOTE — Telephone Encounter (Signed)
 Patient is currently getting supplies from [facility].  Home care delivered has been called and informed.

## 2024-07-25 NOTE — Telephone Encounter (Signed)
Fax placed in provider's box.

## 2024-07-25 NOTE — Telephone Encounter (Signed)
 Patient returning call. Requesting call back, [phone]

## 2024-07-25 NOTE — Telephone Encounter (Signed)
 Nd attempt to contact patient to verify if equipment is needed. LVM for return call.

## 2024-07-26 ENCOUNTER — Other Ambulatory Visit: Payer: Self-pay

## 2024-07-28 ENCOUNTER — Telehealth: Payer: Self-pay | Admitting: Licensed Clinical Social Worker

## 2024-07-28 ENCOUNTER — Other Ambulatory Visit: Payer: Self-pay

## 2024-07-28 DIAGNOSIS — I63521 Cerebral infarction due to unspecified occlusion or stenosis of right anterior cerebral artery: Secondary | ICD-10-CM | POA: Diagnosis not present

## 2024-07-29 DIAGNOSIS — I63521 Cerebral infarction due to unspecified occlusion or stenosis of right anterior cerebral artery: Secondary | ICD-10-CM | POA: Diagnosis not present

## 2024-08-01 DIAGNOSIS — I63521 Cerebral infarction due to unspecified occlusion or stenosis of right anterior cerebral artery: Secondary | ICD-10-CM | POA: Diagnosis not present

## 2024-08-02 DIAGNOSIS — I63521 Cerebral infarction due to unspecified occlusion or stenosis of right anterior cerebral artery: Secondary | ICD-10-CM | POA: Diagnosis not present

## 2024-08-03 ENCOUNTER — Other Ambulatory Visit: Payer: Self-pay

## 2024-08-03 DIAGNOSIS — I63521 Cerebral infarction due to unspecified occlusion or stenosis of right anterior cerebral artery: Secondary | ICD-10-CM | POA: Diagnosis not present

## 2024-08-03 NOTE — Patient Instructions (Signed)
 Visit Information  [name] was given information about Medicaid Managed Care team care coordination services as a part of their [facility] Community Plan Medicaid benefit.   If you would like to schedule transportation through your [facility], please call the following number at least 2 days in advance of your appointment: [phone]   Rides for urgent appointments can also be made after hours by calling Member Services.  Call the Behavioral Health Crisis Line at [phone], at any time, 24 hours a day, 7 days a week. If you are in danger or need immediate medical attention call 911.   Care plan and visit instructions communicated with the patient verbally today. Patient agrees to receive a copy in MyChart. Active MyChart status and patient understanding of how to access instructions and care plan via MyChart confirmed with patient.     Telephone follow up appointment with Managed Medicaid care management team member scheduled for: 08/16/2024 a 1pm  [name], [place]/VBCI - [facility] Social Worker [phone]   Following is a copy of your plan of care:  There are no care plans that you recently modified to display for this patient.

## 2024-08-03 NOTE — Telephone Encounter (Signed)
 You can have her come in tomorrow at 1110 am for a double book.  Thank you.

## 2024-08-03 NOTE — Patient Outreach (Signed)
 Social Drivers of Health  Community Resource and Care Coordination Visit Note   08/03/2024  Name: RAMAYA GUILE MRN: [id number] DOB:[birth year]  Situation: Referral received for [facility] needs assessment and assistance related to utility. I obtained verbal consent from Patient.  Visit completed with Patient on the phone.   Background:   SDOH Interventions Today    Flowsheet Row Most Recent Value  SDOH Interventions   Transportation Interventions --  [patient recieved one-pager on medicaid transportation for [employer].]  Utilities Interventions Community Resources Provided  [BSW provided utility assistance resources via mail.]     Assessment:  BSW placed an in-basket message to PCP Dr. Delbert re patients request to get set up with in-patient physical therapy and requested guidance with this request.   Goals Addressed             This Visit's Progress    BSW goal       Current SDOH Barriers:  Financial constraints related to recent stroke Utility resources Dental need Rehab physical therapy need  Interventions: Patient interviewed and appropriate screenings performed Referred patient to community resources  Provided patient with information about utility assistance resources in the community, BSW will provide patient list of dental providers that accept medicaid.           Recommendation:   attend all scheduled provider appointments call for transportation assistance at least one week before appointments F/u with PCP re request for in patient physical therapy.   Follow Up Plan:   Telephone follow up appointment date/time:  08/16/2024 at 1pm  [name], [place]/VBCI - [facility] Social Worker [phone]

## 2024-08-04 ENCOUNTER — Ambulatory Visit: Attending: Family Medicine | Admitting: Family Medicine

## 2024-08-04 ENCOUNTER — Encounter: Payer: Self-pay | Admitting: Family Medicine

## 2024-08-04 ENCOUNTER — Telehealth: Payer: Self-pay

## 2024-08-04 ENCOUNTER — Other Ambulatory Visit: Payer: Self-pay

## 2024-08-04 VITALS — BP 118/78 | HR 86 | Ht 63.0 in

## 2024-08-04 DIAGNOSIS — N3001 Acute cystitis with hematuria: Secondary | ICD-10-CM | POA: Diagnosis not present

## 2024-08-04 DIAGNOSIS — E1159 Type 2 diabetes mellitus with other circulatory complications: Secondary | ICD-10-CM | POA: Diagnosis not present

## 2024-08-04 DIAGNOSIS — M792 Neuralgia and neuritis, unspecified: Secondary | ICD-10-CM | POA: Diagnosis not present

## 2024-08-04 DIAGNOSIS — L603 Nail dystrophy: Secondary | ICD-10-CM

## 2024-08-04 DIAGNOSIS — I69354 Hemiplegia and hemiparesis following cerebral infarction affecting left non-dominant side: Secondary | ICD-10-CM

## 2024-08-04 DIAGNOSIS — M21332 Wrist drop, left wrist: Secondary | ICD-10-CM | POA: Diagnosis not present

## 2024-08-04 DIAGNOSIS — I63521 Cerebral infarction due to unspecified occlusion or stenosis of right anterior cerebral artery: Secondary | ICD-10-CM | POA: Diagnosis not present

## 2024-08-04 LAB — POCT URINALYSIS DIP (CLINITEK)
Bilirubin, UA: NEGATIVE
Glucose, UA: NEGATIVE mg/dL
Ketones, POC UA: NEGATIVE mg/dL
Leukocytes, UA: NEGATIVE
Nitrite, UA: POSITIVE — AB
POC PROTEIN,UA: 100 — AB
Spec Grav, UA: >=1.03 — AB (ref 1.010–1.025)
Urobilinogen, UA: 0.2 U/dL
pH, UA: 5.5 (ref 5.0–8.0)

## 2024-08-04 MED ORDER — PHENAZOPYRIDINE HCL 95 MG PO TABS
95.0000 mg | ORAL_TABLET | Freq: Three times a day (TID) | ORAL | 0 refills | Status: DC | PRN
  Filled 2024-08-04: qty 30, 10d supply, fill #0

## 2024-08-04 MED ORDER — GABAPENTIN 300 MG PO CAPS
600.0000 mg | ORAL_CAPSULE | Freq: Every day | ORAL | 3 refills | Status: AC
  Filled 2024-08-04: qty 60, 30d supply, fill #0
  Filled 2024-09-11 – 2024-09-14 (×2): qty 60, 30d supply, fill #1
  Filled 2024-10-11: qty 60, 30d supply, fill #2

## 2024-08-04 NOTE — Telephone Encounter (Addendum)
 Completed Physician's Statement for Critical Illness Benefits efaxed to [employer]: [phone].  Completed Access [place] application emailed to [employer]

## 2024-08-04 NOTE — Progress Notes (Signed)
 Subjective:  Patient ID: [name], female    DOB: 08-14-1966  Age: 58 y.o. MRN: [id number]  CC: Urinary Tract Infection (Pain in left foot/Referral to podiatry/Discuss rehab facility )     Discussed the use of AI scribe software for clinical note transcription with the patient, who gave verbal consent to proceed.  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  She has experienced dysuria and a strong odor in her urine since last Saturday. Despite completing a course of nitrofurantoin  which was started on July 06, 2024, her symptoms persist. She is uncertain if the bleeding was vaginal or urinary and reports increased urinary frequency. A urine sample has been provided for analysis.  She requests a podiatry referral for toenail clipping due to persistent pain in her left foot. She experiences jerking movements during sleep following her stroke. She is currently taking baclofen  and awaiting a refill of gabapentin , which has led to worsening symptoms.  She desires to enter a rehab facility for consistent rehabilitation assistance, as she feels overwhelmed managing her care at home. Physical therapy at home is scheduled to stop on August 11, 2024, due to insurance issues. An aide visits her home five days a week for two and a half hours each day. She did not take her blood pressure medication today due to rushing and her blood pressure is  elevated.    Past Medical History:  Diagnosis Date   Clotting disorder    on xarelto    Essential hypertension    Heart murmur    a. 10/2015 Echo: EF 60-65%, no rwma, mild AI/MR, sev dil LA/RA, PASP .   Noncompliance    NSVT (nonsustained ventricular tachycardia) (HCC)    a. 10/2015 during admission for CVA/AF.   Persistent atrial fibrillation (HCC)    a. CHA2DS2VASC = 4-->Xarelto ;  b. 02/2016 Successful DCCV.  c. Recurrent fib   Pneumonia 2012 x 2; 2015   Stroke [facility])    a. 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   Transient ischemic attack (TIA) 01/2013    Past Surgical History:  Procedure Laterality Date   CARDIOVERSION N/A 02/18/2013   Procedure: CARDIOVERSION;  Surgeon: Jerel Balding, MD;  Location: [facility] ENDOSCOPY;  Service: Cardiovascular;  Laterality: N/A;   CARDIOVERSION N/A 02/23/2013   Procedure: CARDIOVERSION;  Surgeon: Alm LELON Clay, MD;  Location: [facility] OR;  Service: Cardiovascular;  Laterality: N/A;  BESIDE CV   CARDIOVERSION N/A 03/12/2016   Procedure: CARDIOVERSION;  Surgeon: Ezra GORMAN Shuck, MD;  Location[facility] ENDOSCOPY;  Service: Cardiovascular;  Laterality: N/A;   IR PERCUTANEOUS ART THROMBECTOMY/INFUSION INTRACRANIAL INC DIAG ANGIO  05/05/2024   IR US  GUIDE VASC ACCESS RIGHT  05/05/2024   RADIOLOGY WITH ANESTHESIA N/A 05/05/2024   Procedure: RADIOLOGY WITH ANESTHESIA;  Surgeon: Radiologist, Medication, MD;  Location: [facility] OR;  Service: Radiology;  Laterality: N/A;   RIGHT/LEFT HEART CATH AND CORONARY ANGIOGRAPHY N/A 04/02/2021  Procedure: RIGHT/LEFT HEART CATH AND CORONARY ANGIOGRAPHY;  Surgeon: Claudene Victory ORN, MD;  Location: [facility] INVASIVE CV LAB;  Service: Cardiovascular;  Laterality: N/A;   TEE WITHOUT CARDIOVERSION N/A 02/18/2013   Procedure: TRANSESOPHAGEAL ECHOCARDIOGRAM (TEE);  Surgeon: Jerel Balding, MD;  Location: [facility] ENDOSCOPY;  Service: Cardiovascular;  Laterality: N/A;   TUBAL  LIGATION  1992    Family History  Problem Relation Age of Onset   Cancer Mother    Atrial fibrillation Mother    Breast cancer Mother    Hypertension Father    Atrial fibrillation Father    Peripheral vascular disease Father    Hypertension Other    Diabetes Other    Heart attack Neg Hx    Stroke Neg Hx    Rectal cancer Neg Hx    Colon cancer Neg Hx    Esophageal cancer Neg Hx    Stomach cancer Neg Hx     Social History   Socioeconomic History   Marital status: Single    Spouse name: Not on file   Number of children: 7   Years of education: Not on file   Highest education level: Associate degree: occupational, scientist, product/process development, or vocational program  Occupational History   Occupation: Multimedia Programmer: [employer]   Occupation: in home health aide  Tobacco Use   Smoking status: Former    Current packs/day: 0.00    Types: Cigarettes    Start date: 03/15/2010    Quit date: 03/15/2013    Years since quitting: 11.3   Smokeless tobacco: Never  Vaping Use   Vaping status: Never Used  Substance and Sexual Activity   Alcohol use: Yes    Comment: drinks on the weekends,   2-3 beers   Drug use: No   Sexual activity: Not Currently    Birth control/protection: None  Other Topics Concern   Not on file  Social History Narrative   Not on file   Social Drivers of Health   Financial Resource Strain: Medium Risk (07/20/2024)   Overall Financial Resource Strain (CARDIA)    Difficulty of Paying Living Expenses: Somewhat hard  Food Insecurity: No Food Insecurity (07/22/2024)   Hunger Vital Sign    Worried About Running Out of Food in the Last Year: Never true    Ran Out of Food in the Last Year: Never true  Transportation Needs: No Transportation Needs (08/03/2024)   PRAPARE - Administrator, Civil Service (Medical): No    Lack of Transportation (Non-Medical): No  Physical Activity: Unknown (11/18/2023)   Exercise Vital Sign    Days of Exercise per Week: 5 days     Minutes of Exercise per Session: Patient declined  Stress: Stress Concern Present (11/18/2023)   [employer] of Occupational Health - Occupational Stress Questionnaire    Feeling of Stress : Rather much  Social Connections: Unknown (11/18/2023)   Social Connection and Isolation Panel    Frequency of Communication with Friends and Family: Patient declined    Frequency of Social Gatherings with Friends and Family: Patient declined    Attends Religious Services: Patient declined    Database Administrator or Organizations: No    Attends Engineer, Structural: Not on file    Marital Status: Never married    Allergies  Allergen Reactions   Penicillins Hives and Other (See Comments)    Unknown  Has patient had a PCN reaction causing immediate rash, facial/tongue/throat swelling, SOB or lightheadedness with hypotension:  No Has patient had a PCN reaction causing severe rash involving mucus membranes or skin necrosis: No Has patient had a PCN reaction that required hospitalization No Has patient had a PCN reaction occurring within the last 10 years: No If all of the above answers are NO, then may proceed with Cephalosporin use.    Outpatient Medications Prior to Visit  Medication Sig Dispense Refill   Accu-Chek FastClix Lancets MISC USE AS INSTRUCTED TO CHECK BLOOD SUGAR ONCE DAILY. 102 each 0   acetaminophen  (TYLENOL ) 325 MG tablet Take 1-2 tablets (325-650 mg total) by mouth every 4 (four) hours as needed for mild pain (pain score 1-3).     baclofen (LIORESAL) 10 MG tablet Take 1 tablet (10 mg total) by mouth 3 (three) times daily. 30 each 0   Blood Glucose Monitoring Suppl (ACCU-CHEK GUIDE ME) w/Device KIT 1 kit by Does not apply route daily. 1 kit 0   Continuous Glucose Receiver (FREESTYLE LIBRE 3 READER) DEVI Apply as directed to test blood glucose 1 each 0   Continuous Glucose Sensor (FREESTYLE LIBRE 3 SENSOR) MISC Place 1 sensor on the skin every 14 days. Use to check  glucose continuously 3 each 11   feeding supplement (ENSURE PLUS HIGH PROTEIN) LIQD Take 237 mLs by mouth daily. 7110 mL 0   glucose blood test strip Use as instructed 100 strip 0   lidocaine  (XYLOCAINE ) 2 % solution Use as directed 15 mLs in the mouth or throat every 6 (six) hours as needed for mouth pain. 120 mL 0   metoprolol  succinate (TOPROL -XL) 100 MG 24 hr tablet Take 1 tablet (100 mg total) by mouth daily. Take with or immediately following a meal. Take with 50mg  to equal 150mg  daily. 90 tablet 3   metoprolol  succinate (TOPROL -XL) 50 MG 24 hr tablet Take 1 tablet (50 mg total) by mouth daily. Take with or immediately following a meal.Take with 100mg  to equal 150mg  daily. 90 tablet 3   Misc. Devices MISC Resting hand splint 1 each 0   nitrofurantoin, macrocrystal-monohydrate, (MACROBID) 100 MG capsule Take 1 capsule (100 mg total) by mouth 2 (two) times daily. 10 capsule 0   Nystatin (GERHARDT'S BUTT CREAM) CREA Apply 1 Application topically 3 (three) times daily. 60 each 0   polyethylene glycol powder (GLYCOLAX /MIRALAX ) 17 GM/SCOOP powder Dissolve 1 capful (17g) in 4-8 ounces of liquid and take by mouth daily. 238 g 0   rivaroxaban  (XARELTO ) 20 MG TABS tablet Take 1 tablet (20 mg total) by mouth daily with supper. 90 tablet 1   rosuvastatin  (CRESTOR ) 40 MG tablet Take 1 tablet (40 mg total) by mouth daily. 90 tablet 1   senna-docusate (SENOKOT-S) 8.6-50 MG tablet Take 1 tablet by mouth at bedtime. 30 tablet 0   spironolactone  (ALDACTONE ) 25 MG tablet Take 0.5 tablets (12.5 mg total) by mouth daily. 45 tablet 1   triamcinolone  cream (KENALOG ) 0.1 % Apply 1 Application topically 2 (two) times daily. 45 g 1   gabapentin  (NEURONTIN ) 300 MG capsule Take 1 capsule (300 mg total) by mouth at bedtime. 30 capsule 3   No facility-administered medications prior to visit.     ROS Review of Systems  Constitutional:  Negative for activity change and appetite change.  HENT:  Negative for sinus  pressure and sore throat.   Respiratory:  Negative for chest tightness, shortness of breath and wheezing.   Cardiovascular:  Negative for chest pain and palpitations.  Gastrointestinal:  Negative for abdominal distention, abdominal pain and constipation.  Genitourinary: Negative.   Musculoskeletal:        See HPI  Psychiatric/Behavioral:  Negative for behavioral problems and dysphoric mood.     Objective:  BP 118/78   Pulse 86   Ht 5' 3 (1.6 m)   SpO2 100%   BMI 34.54 kg/m      08/04/2024   11:58 AM 08/04/2024   11:34 AM 07/08/2024    3:42 PM  BP/Weight  Systolic BP 118 164 102  Diastolic BP 78 79 72  Wt. (Lbs)   195  BMI   34.54 kg/m2      Physical Exam Constitutional:      Appearance: She is well-developed.  Cardiovascular:     Rate and Rhythm: Normal rate.     Heart sounds: Normal heart sounds. No murmur heard. Pulmonary:     Effort: Pulmonary effort is normal.     Breath sounds: Normal breath sounds. No wheezing or rales.  Chest:     Chest wall: No tenderness.  Abdominal:     General: Bowel sounds are normal. There is no distension.     Palpations: Abdomen is soft. There is no mass.     Tenderness: There is no abdominal tenderness. There is no right CVA tenderness or left CVA tenderness.  Musculoskeletal:     Right lower leg: No edema.     Left lower leg: No edema.     Comments: On left upper extremity and left lower extremity spasticity with reduced range of motion Reduced strength in left upper and left lower extremity  Neurological:     Mental Status: She is alert and oriented to person, place, and time.  Psychiatric:        Mood and Affect: Mood normal.        Latest Ref Rng & Units 06/15/2024   11:07 AM 06/06/2024    5:04 AM 06/03/2024    5:10 AM  CMP  Glucose 70 - 99 mg/dL 95  93    BUN 8 - 27 mg/dL 8  9    Creatinine 9.42 - 1.00 mg/dL 9.04  8.76    Sodium 865 - 144 mmol/L 142  141    Potassium 3.5 - 5.2 mmol/L 4.3  3.8  3.6   Chloride 96  - 106 mmol/L 105  109    CO2 20 - 29 mmol/L 19  21    Calcium  8.7 - 10.3 mg/dL 89.8  9.3      Lipid Panel     Component Value Date/Time   CHOL 152 05/06/2024 0559   CHOL 201 (H) 04/13/2024 1621   TRIG 39 05/06/2024 0559   HDL 35 (L) 05/06/2024 0559   HDL 49 04/13/2024 1621   CHOLHDL 4.3 05/06/2024 0559   VLDL 8 05/06/2024 0559   LDLCALC 109 (H) 05/06/2024 0559   LDLCALC 140 (H) 04/13/2024 1621    CBC    Component Value Date/Time   WBC 8.1 06/06/2024 0504   RBC 5.08 06/06/2024 0504   HGB 13.4 06/06/2024 0504   HCT 42.6 06/06/2024 0504   PLT 151 06/06/2024 0504   MCV 83.9 06/06/2024 0504   MCH 26.4 06/06/2024 0504   MCHC 31.5 06/06/2024 0504   RDW 13.5 06/06/2024 0504   LYMPHSABS 1.8 05/10/2024 0447   MONOABS 0.9 05/10/2024 0447   EOSABS 0.1 05/10/2024 0447   BASOSABS 0.0 05/10/2024 0447    Lab Results  Component Value Date   HGBA1C 6.1 (H) 05/05/2024       Assessment &  Plan Acute cystitis Persistent urinary symptoms despite nitrofurantoin, suggesting possible antibiotic resistance. - Ordered urine culture for organism identification and sensitivity. - Advised cranberry juice and OTC Pyridium for symptom relief. - Sent Pyridium prescription to pharmacy.  Neuropathic pain Chronic pain and sleep disturbances, possibly exacerbated by peripheral neuropathy. Symptoms worsened after gabapentin  depletion. - Increased gabapentin  to 600 mg at bedtime, caution for drowsiness. - Advised monitoring for morning grogginess and dose adjustment if needed.   Nail dystrophy Nail dystrophy causing discomfort. - Referred to podiatrist for toenail care.   Hypertension associated with type 2 diabetes mellitus Slightly elevated blood pressure, possibly due to missed antihypertensive dose. - Emphasized the need for medication adherence -Counseled on blood pressure goal of less than 130/80, low-sodium, DASH diet, medication compliance, 150 minutes of moderate intensity exercise  per week. Discussed medication compliance, adverse effects.    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  Per feedback from case manager they would like option for daily outpatient physical therapy. - Will also work to obtain increased hours for her for [facility] services and also assistance with scat transportation    Meds ordered this encounter  Medications   gabapentin  (NEURONTIN ) 300 MG capsule    Sig: Take 2 capsules (600 mg total) by mouth at bedtime.    Dispense:  60 capsule    Refill:  3    Dose increase   phenazopyridine (PYRIDIUM) 95 MG tablet    Sig: Take 1 tablet (95 mg total) by mouth 3 (three) times daily as needed (urinary symptoms).    Dispense:  10 tablet    Refill:  0    Follow-up: Return for previously scheduled appointment.       Corrina Sabin, MD, FAAFP. [facility] [place], [place] [phone]   08/04/2024, 12:22 PM

## 2024-08-04 NOTE — Patient Instructions (Signed)
 VISIT SUMMARY:  Today, you were seen for urinary symptoms and requested a podiatry referral. You have been experiencing painful urination and a strong odor in your urine since last Saturday, despite completing a course of antibiotics. You also reported increased urinary frequency and some bleeding. Additionally, you requested a referral for toenail clipping due to persistent pain in your left foot. You mentioned experiencing jerking movements during sleep following your stroke and are currently taking baclofen while awaiting a refill of gabapentin . You expressed a desire to enter a rehab facility for consistent rehabilitation assistance as managing your care at home has become overwhelming. Lastly, you did not take your blood pressure medication today due to rushing.  YOUR PLAN:  -URINARY TRACT INFECTION WITH POSSIBLE ANTIBIOTIC RESISTANCE: A urinary tract infection (UTI) is an infection in any part of your urinary system. Your symptoms have persisted despite taking antibiotics, suggesting the bacteria might be resistant. We have ordered a urine culture to identify the specific bacteria and determine the best antibiotic. In the meantime, you can drink cranberry juice and take over-the-counter Pyridium for symptom relief. A prescription for Pyridium has been sent to your pharmacy.  -CHRONIC PAIN WITH SLEEP DISTURBANCES: Chronic pain and sleep disturbances can be due to various reasons, including peripheral neuropathy. Your symptoms have worsened after running out of gabapentin . We have increased your gabapentin  dose to 600 mg at bedtime. Please monitor for drowsiness in the morning and adjust the dose if necessary.  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 This condition is likely due to your previous stroke. Continue taking baclofen for muscle relaxation, and we have increased your gabapentin  to help manage  the neuropathic pain.  -NAIL DYSTROPHY, LEFT FOOT: Nail dystrophy refers to abnormal nail growth, which can cause discomfort. You have been referred to a podiatrist for toenail care.  -HYPERTENSION: Hypertension is high blood pressure. Your blood pressure was slightly elevated today, possibly because you missed your antihypertensive medication.  INSTRUCTIONS:  Please follow up with the urine culture results to determine the appropriate antibiotic for your UTI. Continue taking your medications as prescribed and monitor for any side effects. Schedule an appointment with the podiatrist for your toenail care. Consider discussing your desire to enter a rehab facility with your healthcare provider to explore available options. Make sure to take your blood pressure medication daily to manage your hypertension.

## 2024-08-04 NOTE — Telephone Encounter (Addendum)
 I met with the patient and her daughters, [name] and [name], when they were in the clinic today. They explained that the patient has been receiving home health services through [facility] but they are about to discharge her from their services. They were not sure if it is due to lack of on-going authorization from the insurance.  They also said that the home health agency mentioned Medicare; but the patient said she does not have Medicare. I told them that I would call the home health agency to obtain more information.  The patient stated that she really wants outpatient PT and would like to be referred to [facility] on [address].  That information was shared with Dr Newlin.  She has been receiving [facility] 2.5 hrs/day x 5 days/week and would like to know if she could qualify for more hours.  I told them that we can submit a request for an increase in PCS hours but Dr Delbert would have to be in agreement that there is a change in her status. I also explained about the CAP program and she may qualify for more hours through that program. However, there can be a long wait for that program. I provided them with the number for [facility] and encouraged them to call and initiate a referral. I also explained that if approved for that program, a family member can be paid to provide the care.   We spoke about transportation resources.  They are aware of transportation through the insurance company but they have not submitted an application for [employer].  While in the clinic they completed Part A of the application and I told them I would complete Part B and submit the complete application to [employer].  They confirmed that there is a ramp at her home.    The patient explained that she has paid into a Critical Illness  program through her employer- [employer].  They had paperwork with them which we reviewed and I showed them the documents that the patient needs to complete, sign and submit.  I took  the  Attending Physician Statement  and gave that to Dr Newlin.  I told them that I would submit that to [employer] when Dr Newlin has completed it.    I instructed them to please call me with any questions/concerns.    I called [facility] about her home health services and was informed that they will not be discharging the patient, they are planning to re-certify her for services.  They also confirmed that they do not have any record of Medicare for her, only [facility] [employer].

## 2024-08-05 ENCOUNTER — Encounter: Admitting: Physical Medicine & Rehabilitation

## 2024-08-05 DIAGNOSIS — I63521 Cerebral infarction due to unspecified occlusion or stenosis of right anterior cerebral artery: Secondary | ICD-10-CM | POA: Diagnosis not present

## 2024-08-08 ENCOUNTER — Ambulatory Visit: Payer: Self-pay | Admitting: Family Medicine

## 2024-08-08 ENCOUNTER — Encounter: Payer: Self-pay | Admitting: Neurology

## 2024-08-08 ENCOUNTER — Ambulatory Visit (INDEPENDENT_AMBULATORY_CARE_PROVIDER_SITE_OTHER): Admitting: Neurology

## 2024-08-08 ENCOUNTER — Other Ambulatory Visit: Payer: Self-pay

## 2024-08-08 VITALS — BP 127/96 | HR 77

## 2024-08-08 DIAGNOSIS — I482 Chronic atrial fibrillation, unspecified: Secondary | ICD-10-CM

## 2024-08-08 DIAGNOSIS — G3184 Mild cognitive impairment, so stated: Secondary | ICD-10-CM

## 2024-08-08 DIAGNOSIS — G8110 Spastic hemiplegia affecting unspecified side: Secondary | ICD-10-CM | POA: Diagnosis not present

## 2024-08-08 DIAGNOSIS — R413 Other amnesia: Secondary | ICD-10-CM

## 2024-08-08 LAB — URINE CULTURE

## 2024-08-08 MED ORDER — SULFAMETHOXAZOLE-TRIMETHOPRIM 800-160 MG PO TABS
1.0000 | ORAL_TABLET | Freq: Two times a day (BID) | ORAL | 0 refills | Status: DC
  Filled 2024-08-08: qty 14, 7d supply, fill #0

## 2024-08-08 NOTE — Patient Instructions (Signed)
 I had a long d/w patient  and her daughter about her recent embolic strokes, atrial fibrillation, spastic hemiplegia and mild cognitive impairment, risk for recurrent stroke/TIAs, personally independently reviewed imaging studies and stroke evaluation results and answered questions.Continue Xarelto  (rivaroxaban ) daily  for secondary stroke prevention and maintain strict control of hypertension with blood pressure goal below 130/90, diabetes with hemoglobin A1c goal below 6.5% and lipids with LDL cholesterol goal below 70 mg/dL. I also advised the patient to eat a healthy diet with plenty of whole grains, cereals, fruits and vegetables, exercise regularly and maintain ideal body weight .continue ongoing physical and Occupational Therapy.  We also discussed memory compensation strategies.  I encouraged her to increase participation in cognitively challenging activities like solving crossword puzzles, playing bridge and sudoku.  We also discussed fall prevention precautions and she was advised to use her walker at all times.  Followup in the future with my nurse practitioner in 6 to 8 months or call earlier if necessary.  Memory Compensation Strategies  Use WARM strategy.  W= write it down  A= associate it  R= repeat it  M= make a mental note  2.   You can keep a Glass Blower/designer.  Use a 3-ring notebook with sections for the following: calendar, important names and phone numbers,  medications, doctors' names/phone numbers, lists/reminders, and a section to journal what you did  each day.   3.    Use a calendar to write appointments down.  4.    Write yourself a schedule for the day.  This can be placed on the calendar or in a separate section of the Memory Notebook.  Keeping a  regular schedule can help memory.  5.    Use medication organizer with sections for each day or morning/evening pills.  You may need help loading it  6.    Keep a basket, or pegboard by the door.  Place items that you need  to take out with you in the basket or on the pegboard.  You may also want to  include a message board for reminders.  7.    Use sticky notes.  Place sticky notes with reminders in a place where the task is performed.  For example:  turn off the  stove placed by the stove, lock the door placed on the door at eye level,  take your medications on  the bathroom mirror or by the place where you normally take your medications.  8.    Use alarms/timers.  Use while cooking to remind yourself to check on food or as a reminder to take your medicine, or as a  reminder to make a call, or as a reminder to perform another task, etc.  Stroke Prevention Some medical conditions and behaviors can lead to a higher chance of having a stroke. You can help prevent a stroke by eating healthy, exercising, not smoking, and managing any medical conditions you have. Stroke is a leading cause of functional impairment. Primary prevention is particularly important because a majority of strokes are first-time events. Stroke changes the lives of not only those who experience a stroke but also their family and other caregivers. How can this condition affect me? A stroke is a medical emergency and should be treated right away. A stroke can lead to brain damage and can sometimes be life-threatening. If a person gets medical treatment right away, there is a better chance of surviving and recovering from a stroke. What can increase my risk? The  following medical conditions may increase your risk of a stroke: Cardiovascular disease. High blood pressure (hypertension). Diabetes. High cholesterol. Sickle cell disease. Blood clotting disorders (hypercoagulable state). Obesity. Sleep disorders (obstructive sleep apnea). Other risk factors include: Being older than age 76. Having a history of blood clots, stroke, or mini-stroke (transient ischemic attack, TIA). Genetic factors, such as race, ethnicity, or a family history of  stroke. Smoking cigarettes or using other tobacco products. Taking birth control pills, especially if you also use tobacco. Heavy use of alcohol or drugs, especially cocaine and methamphetamine. Physical inactivity. What actions can I take to prevent this? Manage your health conditions High cholesterol levels. Eating a healthy diet is important for preventing high cholesterol. If cholesterol cannot be managed through diet alone, you may need to take medicines. Take any prescribed medicines to control your cholesterol as told by your health care provider. Hypertension. To reduce your risk of stroke, try to keep your blood pressure below 130/80. Eating a healthy diet and exercising regularly are important for controlling blood pressure. If these steps are not enough to manage your blood pressure, you may need to take medicines. Take any prescribed medicines to control hypertension as told by your health care provider. Ask your health care provider if you should monitor your blood pressure at home. Have your blood pressure checked every year, even if your blood pressure is normal. Blood pressure increases with age and some medical conditions. Diabetes. Eating a healthy diet and exercising regularly are important parts of managing your blood sugar (glucose). If your blood sugar cannot be managed through diet and exercise, you may need to take medicines. Take any prescribed medicines to control your diabetes as told by your health care provider. Get evaluated for obstructive sleep apnea. Talk to your health care provider about getting a sleep evaluation if you snore a lot or have excessive sleepiness. Make sure that any other medical conditions you have, such as atrial fibrillation or atherosclerosis, are managed. Nutrition Follow instructions from your health care provider about what to eat or drink to help manage your health condition. These instructions may include: Reducing your daily calorie  intake. Limiting how much salt (sodium) you use to 1,500 milligrams (mg) each day. Using only healthy fats for cooking, such as olive oil, canola oil, or sunflower oil. Eating healthy foods. You can do this by: Choosing foods that are high in fiber, such as whole grains, and fresh fruits and vegetables. Eating at least 5 servings of fruits and vegetables a day. Try to fill one-half of your plate with fruits and vegetables at each meal. Choosing lean protein foods, such as lean cuts of meat, poultry without skin, fish, tofu, beans, and nuts. Eating low-fat dairy products. Avoiding foods that are high in sodium. This can help lower blood pressure. Avoiding foods that have saturated fat, trans fat, and cholesterol. This can help prevent high cholesterol. Avoiding processed and prepared foods. Counting your daily carbohydrate intake.  Lifestyle If you drink alcohol: Limit how much you have to: 0-1 drink a day for women who are not pregnant. 0-2 drinks a day for men. Know how much alcohol is in your drink. In the [place] one drink equals one 12 oz bottle of beer ( ), one 5 oz glass of wine ( ), or one 1 oz glass of hard liquor (44mL). Do not use any products that contain nicotine or tobacco. These products include cigarettes, chewing tobacco, and vaping devices, such as e-cigarettes. If you need help quitting, ask  your health care provider. Avoid secondhand smoke. Do not use drugs. Activity  Try to stay at a healthy weight. Get at least 30 minutes of exercise on most days, such as: Fast walking. Biking. Swimming. Medicines Take over-the-counter and prescription medicines only as told by your health care provider. Aspirin  or blood thinners (antiplatelets or anticoagulants) may be recommended to reduce your risk of forming blood clots that can lead to stroke. Avoid taking birth control pills. Talk to your health care provider about the risks of taking birth control pills if: You are  over 68 years old. You smoke. You get very bad headaches. You have had a blood clot. Where to find more information American Stroke Association: www.strokeassociation.org Get help right away if: You or a loved one has any symptoms of a stroke. BE FAST is an easy way to remember the main warning signs of a stroke: B - Balance. Signs are dizziness, sudden trouble walking, or loss of balance. E - Eyes. Signs are trouble seeing or a sudden change in vision. F - Face. Signs are sudden weakness or numbness of the face, or the face or eyelid drooping on one side. A - Arms. Signs are weakness or numbness in an arm. This happens suddenly and usually on one side of the body. S - Speech. Signs are sudden trouble speaking, slurred speech, or trouble understanding what people say. T - Time. Time to call emergency services. Write down what time symptoms started. You or a loved one has other signs of a stroke, such as: A sudden, severe headache with no known cause. Nausea or vomiting. Seizure. These symptoms may represent a serious problem that is an emergency. Do not wait to see if the symptoms will go away. Get medical help right away. Call your local emergency services (911 in the [place] Do not drive yourself to the hospital. Summary You can help to prevent a stroke by eating healthy, exercising, not smoking, limiting alcohol intake, and managing any medical conditions you may have. Do not use any products that contain nicotine or tobacco. These include cigarettes, chewing tobacco, and vaping devices, such as e-cigarettes. If you need help quitting, ask your health care provider. Remember BE FAST for warning signs of a stroke. Get help right away if you or a loved one has any of these signs. This information is not intended to replace advice given to you by your health care provider. Make sure you discuss any questions you have with your health care provider. Document Revised: 08/04/2022 Document  Reviewed: 08/04/2022 Elsevier Patient Education  2024 [employer].

## 2024-08-08 NOTE — Telephone Encounter (Signed)
 Request for an increase in PCS hours faxed to [facility]

## 2024-08-08 NOTE — Progress Notes (Signed)
 [facility] [address] [place]. [place] [address] [phone]       OFFICE FOLLOW-UP NOTE  [name] Date of Birth:  May 09, 1966 Medical Record Number:  [id number]   HPI: [name] is a pleasant 58 year old African-American lady seen today for initial office follow-up visit following hospital admission for stroke in August 2025.  She is accompanied by her daughter Hospital Doctor.  History is obtained from them and review of electronic medical records.  I personally reviewed pertinent available imaging films in PACS. She has past medical history of hypertension, hyperlipidemia, diabetes, chronic atrial fibrillation, right posterior MCA embolic stroke in 2019 and right temporoparietal stroke with M2 occlusion in 2017 and known history of noncompliance with Xarelto  for her chronic atrial fibrillation.  She presented on 05/05/2024 with sudden onset of left-sided weakness while at work.  On exam upon arrival in the emergency room she was found to have left hemianopsia, left neglect and mild aphasia and dysarthria with left facial weakness which was chronic.  NIH stroke scale was 12 on admission with CT angiogram showing right M1 occlusion.  She was not given thrombolysis due to having taken Xarelto  within 48 hours.  After discussion of risk benefits and alternatives she was taken for emergent mechanical thrombectomy which was performed successfully with TICI 2b revascularization of the right MCA.  She has remained admitted to the ICU and remained stable.  MRI scan of the brain showed acute right MCA and ACA territory patchy infarcts with petechial hemorrhage but no significant hemorrhagic transformation.  2D echo showed ejection fraction 60 to 65%.  LDL cholesterol 109 mg percent.  Hemoglobin A1c 6.1.  Urine drug screen was negative.  Patient was counseled to be compliant with his Xarelto  which was continued.  She was seen by therapist and recommended inpatient rehab.  She spent 4 weeks in  inpatient rehab and was discharged home and is currently living with her daughter.  She still has significant left hemiparesis.  She is able to walk a bit with a walker with one-person assist usually with the therapist.  She is continuing to get home physical and Occupational Therapy.  She states her peripheral vision and speech and swallowing have improved.  She does complain of mild short-term memory difficulties and gets disoriented easily.  Cognitive testing today she scored 25/30 on the Mini-Mental status exam. ROS:   14 system review of systems is positive for weakness, difficulty walking, memory difficulties, disorientation, confusion and all other systems negative  PMH:  Past Medical History:  Diagnosis Date   Clotting disorder    on xarelto    Essential hypertension    Heart murmur    a. 10/2015 Echo: EF 60-65%, no rwma, mild AI/MR, sev dil LA/RA, PASP .   Noncompliance    NSVT (nonsustained ventricular tachycardia) (HCC)    a. 10/2015 during admission for CVA/AF.   Persistent atrial fibrillation (HCC)    a. CHA2DS2VASC = 4-->Xarelto ;  b. 02/2016 Successful DCCV.  c. Recurrent fib   Pneumonia 2012 x 2; 2015   Stroke [facility])    a. 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   Transient ischemic attack (TIA) 01/2013    Social History:  Social History   Socioeconomic History   Marital status: Single    Spouse name: Not on file   Number of children: 7   Years of education: Not on file   Highest education level: Associate degree:  occupational, scientist, product/process development, or vocational program  Occupational History   Occupation: Multimedia Programmer: [employer]   Occupation: in home health aide  Tobacco Use   Smoking status: Former    Current packs/day: 0.00    Types: Cigarettes    Start date: 03/15/2010    Quit date: 03/15/2013    Years since quitting: 11.4   Smokeless tobacco: Never  Vaping Use    Vaping status: Never Used  Substance and Sexual Activity   Alcohol use: Yes    Comment: drinks on the weekends,   2-3 beers   Drug use: No   Sexual activity: Not Currently    Birth control/protection: None  Other Topics Concern   Not on file  Social History Narrative   Not on file   Social Drivers of Health   Financial Resource Strain: Medium Risk (07/20/2024)   Overall Financial Resource Strain (CARDIA)    Difficulty of Paying Living Expenses: Somewhat hard  Food Insecurity: No Food Insecurity (07/22/2024)   Hunger Vital Sign    Worried About Running Out of Food in the Last Year: Never true    Ran Out of Food in the Last Year: Never true  Transportation Needs: No Transportation Needs (08/03/2024)   PRAPARE - Administrator, Civil Service (Medical): No    Lack of Transportation (Non-Medical): No  Physical Activity: Unknown (11/18/2023)   Exercise Vital Sign    Days of Exercise per Week: 5 days    Minutes of Exercise per Session: Patient declined  Stress: Stress Concern Present (11/18/2023)   [employer] of Occupational Health - Occupational Stress Questionnaire    Feeling of Stress : Rather much  Social Connections: Unknown (11/18/2023)   Social Connection and Isolation Panel    Frequency of Communication with Friends and Family: Patient declined    Frequency of Social Gatherings with Friends and Family: Patient declined    Attends Religious Services: Patient declined    Database Administrator or Organizations: No    Attends Engineer, Structural: Not on file    Marital Status: Never married  Intimate Partner Violence: Not At Risk (07/20/2024)   Humiliation, Afraid, Rape, and Kick questionnaire    Fear of Current or Ex-Partner: No    Emotionally Abused: No    Physically Abused: No    Sexually Abused: No    Medications:   Current Outpatient Medications on File Prior to Visit  Medication Sig Dispense Refill   Accu-Chek FastClix Lancets MISC USE AS  INSTRUCTED TO CHECK BLOOD SUGAR ONCE DAILY. 102 each 0   acetaminophen  (TYLENOL ) 325 MG tablet Take 1-2 tablets (325-650 mg total) by mouth every 4 (four) hours as needed for mild pain (pain score 1-3).     baclofen  (LIORESAL ) 10 MG tablet Take 1 tablet (10 mg total) by mouth 3 (three) times daily. 30 each 0   Blood Glucose Monitoring Suppl (ACCU-CHEK GUIDE ME) w/Device KIT 1 kit by Does not apply route daily. 1 kit 0   gabapentin  (NEURONTIN ) 300 MG capsule Take 2 capsules (600 mg total) by mouth at bedtime. 60 capsule 3   glucose blood test strip Use as instructed 100 strip 0   lidocaine  (XYLOCAINE ) 2 % solution Use as directed 15 mLs in the mouth or throat every 6 (six) hours as needed for mouth pain. 120 mL 0   metoprolol  succinate (TOPROL -XL) 100 MG 24 hr tablet Take 1 tablet (100 mg total) by mouth daily. Take with  or immediately following a meal. Take with 50mg  to equal 150mg  daily. 90 tablet 3   metoprolol  succinate (TOPROL -XL) 50 MG 24 hr tablet Take 1 tablet (50 mg total) by mouth daily. Take with or immediately following a meal.Take with 100mg  to equal 150mg  daily. 90 tablet 3   phenazopyridine  (PYRIDIUM ) 95 MG tablet Take 1 tablet (95 mg total) by mouth 3 (three) times daily as needed (urinary symptoms). 30 tablet 0   polyethylene glycol powder (GLYCOLAX /MIRALAX ) 17 GM/SCOOP powder Dissolve 1 capful (17g) in 4-8 ounces of liquid and take by mouth daily. 238 g 0   rivaroxaban  (XARELTO ) 20 MG TABS tablet Take 1 tablet (20 mg total) by mouth daily with supper. 90 tablet 1   rosuvastatin  (CRESTOR ) 40 MG tablet Take 1 tablet (40 mg total) by mouth daily. 90 tablet 1   senna-docusate (SENOKOT-S) 8.6-50 MG tablet Take 1 tablet by mouth at bedtime. 30 tablet 0   spironolactone  (ALDACTONE ) 25 MG tablet Take 0.5 tablets (12.5 mg total) by mouth daily. 45 tablet 1   Continuous Glucose Receiver (FREESTYLE LIBRE 3 READER) DEVI Apply as directed to test blood glucose (Patient not taking: Reported on  08/08/2024) 1 each 0   Continuous Glucose Sensor (FREESTYLE LIBRE 3 SENSOR) MISC Place 1 sensor on the skin every 14 days. Use to check glucose continuously (Patient not taking: Reported on 08/08/2024) 3 each 11   feeding supplement (ENSURE PLUS HIGH PROTEIN) LIQD Take 237 mLs by mouth daily. (Patient not taking: Reported on 08/08/2024) 7110 mL 0   Misc. Devices MISC Resting hand splint 1 each 0   nitrofurantoin , macrocrystal-monohydrate, (MACROBID ) 100 MG capsule Take 1 capsule (100 mg total) by mouth 2 (two) times daily. (Patient not taking: Reported on 08/08/2024) 10 capsule 0   Nystatin (GERHARDT'S BUTT CREAM) CREA Apply 1 Application topically 3 (three) times daily. (Patient not taking: Reported on 08/08/2024) 60 each 0   triamcinolone  cream (KENALOG ) 0.1 % Apply 1 Application topically 2 (two) times daily. 45 g 1   [DISCONTINUED] albuterol  (PROVENTIL  HFA;VENTOLIN  HFA) 108 (90 Base) MCG/ACT inhaler Inhale 2 puffs into the lungs every 6 (six) hours as needed for wheezing or shortness of breath. (Patient not taking: Reported on 03/28/2021) 1 Inhaler 2   [DISCONTINUED] fluticasone  (FLONASE ) 50 MCG/ACT nasal spray Place 2 sprays into both nostrils daily. (Patient not taking: Reported on 03/28/2021) 16 g 6   No current facility-administered medications on file prior to visit.    Allergies:   Allergies  Allergen Reactions   Penicillins Hives and Other (See Comments)    Unknown  Has patient had a PCN reaction causing immediate rash, facial/tongue/throat swelling, SOB or lightheadedness with hypotension: No Has patient had a PCN reaction causing severe rash involving mucus membranes or skin necrosis: No Has patient had a PCN reaction that required hospitalization No Has patient had a PCN reaction occurring within the last 10 years: No If all of the above answers are NO, then may proceed with Cephalosporin use.    Physical Exam General: well developed, well nourished pleasant middle-age  African-American lady, seated, in no evident distress Head: head normocephalic and atraumatic.  Neck: supple with no carotid or supraclavicular bruits Cardiovascular: regular rate and rhythm, no murmurs Musculoskeletal: no deformity Skin:  no rash/petichiae Vascular:  Normal pulses all extremities Vitals:   08/08/24 0925  BP: (!) 127/96  Pulse: 77   Neurologic Exam Mental Status: Awake and fully alert. Oriented to place and time. Recent and remote memory intact. Attention  span, concentration and fund of knowledge appropriate. Mood and affect appropriate.  MMSE score 25/30.  Geriatric depression scale 6 borderline for depression.  Clock drawing 3/4.  Functional activity questionnaire score 26 mostly dependent for most activities. Cranial Nerves: Fundoscopic exam reveals sharp disc margins. Pupils equal, briskly reactive to light. Extraocular movements full without nystagmus. Visual fields full to confrontation. Hearing intact. Facial sensation intact.  Mild left lower facial asymmetry.  Tongue, palate moves normally and symmetrically.  Motor: Normal strength on the right.  Spastic left hemiplegia with grade 1/5 strength proximally at the left shoulder and left hip.  Left foot drop.  Tone is increased on the left. Sensory.: intact to touch ,pinprick .position and vibratory sensation.  Coordination: Rapid alternating movements normal in all extremities. Finger-to-nose and heel-to-shin performed accurately bilaterally. Gait and Station: Deferred as patient did not bring her walker and uses a one-person assist even with that.   Reflexes: 1+ and asymmetric and brisker on the left. Toes downgoing.   NIHSS  8 Modified Rankin  4    08/08/2024    9:27 AM  MMSE - Mini Mental State Exam  Orientation to time 5  Orientation to Place 5  Registration 3  Attention/ Calculation 1  Recall 3  Language- name 2 objects 2  Language- repeat 1  Language- follow 3 step command 3  Language- read & follow  direction 1  Write a sentence 1  Copy design 0  Total score 25     ASSESSMENT: 58 year old African-American lady with right MCA and ACA embolic infarcts in August 2025 due to atrial fibrillation with noncompliance on Xarelto .  Patient has significant residual spastic left hemiplegia and is also having memory difficulties due to mild poststroke cognitive impairment.  Vascular risk factors of hypertension, hyperlipidemia,diabetes, obesity and atrial fibrillation and prior strokes.     PLAN:I had a long d/w patient  and her daughter about her recent embolic strokes, atrial fibrillation, spastic hemiplegia and mild cognitive impairment, risk for recurrent stroke/TIAs, personally independently reviewed imaging studies and stroke evaluation results and answered questions.Continue Xarelto  (rivaroxaban ) daily  for secondary stroke prevention and maintain strict control of hypertension with blood pressure goal below 130/90, diabetes with hemoglobin A1c goal below 6.5% and lipids with LDL cholesterol goal below 70 mg/dL. I also advised the patient to eat a healthy diet with plenty of whole grains, cereals, fruits and vegetables, exercise regularly and maintain ideal body weight .continue ongoing physical and Occupational Therapy.  We also discussed memory compensation strategies.  I encouraged her to increase participation in cognitively challenging activities like solving crossword puzzles, playing bridge and sudoku.  We also discussed fall prevention precautions and she was advised to use her walker at all times.  Followup in the future with my nurse practitioner in 6 to 8 months or call earlier if necessary.    I personally spent a total of 40 minutes in the care of the patient today including getting/reviewing separately obtained history, performing a medically appropriate exam/evaluation, counseling and educating, placing orders, referring and communicating with other health care professionals, documenting  clinical information in the EHR, independently interpreting results, and coordinating care.       Eather Popp, MD  Note: This document was prepared with digital dictation and possible smart phrase technology. Any transcriptional errors that result from this process are unintentional

## 2024-08-09 ENCOUNTER — Telehealth: Payer: Self-pay | Admitting: Family Medicine

## 2024-08-09 NOTE — Telephone Encounter (Addendum)
 Patient's daughter Imara, [place] came in the office today and dropped off paperwork CAP/DA Waiver Disclosure form. Patient's daughter was informed that any form requiring completion will take 7-14 business days. Paperwork placed in Provider's box.

## 2024-08-09 NOTE — Telephone Encounter (Signed)
 FYI

## 2024-08-10 ENCOUNTER — Telehealth (INDEPENDENT_AMBULATORY_CARE_PROVIDER_SITE_OTHER): Payer: Self-pay

## 2024-08-10 NOTE — Telephone Encounter (Signed)
 LVM with verbal orders for patient.

## 2024-08-10 NOTE — Telephone Encounter (Signed)
 Copied from CRM #[id number]. Topic: Clinical - Home Health Verbal Orders >> Aug 09, 2024  2:07 PM [name] ORN wrote: Caller/Agency: Signe - OT from [facility] Number: [phone] Service Requested: Occupational Therapy Frequency: once a week for 8 week starting next week Any new concerns about the patient? No

## 2024-08-10 NOTE — Telephone Encounter (Signed)
 Form has been given to Dr Newlin for signature

## 2024-08-13 DIAGNOSIS — G8929 Other chronic pain: Secondary | ICD-10-CM | POA: Diagnosis not present

## 2024-08-15 ENCOUNTER — Ambulatory Visit: Admitting: Podiatry

## 2024-08-16 ENCOUNTER — Other Ambulatory Visit: Payer: Self-pay | Admitting: Family Medicine

## 2024-08-16 ENCOUNTER — Other Ambulatory Visit: Payer: Self-pay

## 2024-08-16 ENCOUNTER — Telehealth: Payer: Self-pay

## 2024-08-16 DIAGNOSIS — H5713 Ocular pain, bilateral: Secondary | ICD-10-CM

## 2024-08-16 NOTE — Patient Outreach (Signed)
 No Telephone outreach to patient. 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 MRS= 4  Shereen Gin [facility] VBCI Assistant Direct Dial : [phone]  Fax: [phone] Website: [email or web address]

## 2024-08-16 NOTE — Patient Instructions (Signed)
 Visit Information  Ms. Nations was given information about Medicaid Managed Care team care coordination services as a part of their [facility] Community Plan Medicaid benefit.   If you would like to schedule transportation through your [facility], please call the following number at least 2 days in advance of your appointment: [phone]   Rides for urgent appointments can also be made after hours by calling Member Services.  Call the Behavioral Health Crisis Line at [phone], at any time, 24 hours a day, 7 days a week. If you are in danger or need immediate medical attention call 911.  Please see education materials related to vision providers provided by email.   Patient verbalizes understanding of instructions and care plan provided today and agrees to view in MyChart. Active MyChart status and patient understanding of how to access instructions and care plan via MyChart confirmed with patient.     Telephone follow up appointment with Managed Medicaid care management team member scheduled for: 08/25/2024 at 10am.  [name], BSW [place]/VBCI - [facility] Social Worker [phone]   Following is a copy of your plan of care:  There are no care plans that you recently modified to display for this patient.

## 2024-08-16 NOTE — Telephone Encounter (Signed)
 I called patient at the request of German Verlena Jubilee, BSW.   She explained that she has the papers that she signed for the critical illness policy and she is not sure what to do with them.  I told her that the forms should either be faxed or mailed to [employer] or [employer] ([employer]) depending on who is requesting the documents. I said that the fax/ email information should be on the documents and her  daughter, [name], has the documents that she signed.  I also told her that if she or her daughters have any questions, she can call me or they can bring the documents to the clinic for me to review and/or fax as needed.  She said she would speak to [facility].  She then asked about the actual benefit coverage, when the coverage starts and what is covered.  I told her that she will need to speak to the [employer] or the [employer] for more information about coverage and she said she will call them.   I told her that I submitted the Physician Statement for the Critical Illness policy was faxed to [employer] 08/04/2024.  I asked her if she has been contacted by outpatient PT and she said no. I told her that per her home health company, they were not planning to discharge her, they were going to re-certify her for services.  She said that the PT came out to see her 08/15/2024.  I told her that she will not be able to have outpatient and home PT so she can stay with the home health PT and when outpatient calls her she can decide if she wants to switch to outpatient at that time and she said she understood.

## 2024-08-16 NOTE — Telephone Encounter (Signed)
 Signed CAP application efaxed to [employer]: [phone]

## 2024-08-16 NOTE — Patient Outreach (Signed)
 Social Drivers of Health  Community Resource and Care Coordination Visit Note   08/16/2024  Name: [name] MRN: [id number] DOB:[birth year]  Situation: Referral received for [facility] needs assessment and assistance related to utilities, vision provider. I obtained verbal consent from Patient.  Visit completed with Patient on the phone.   Background:   SDOH Interventions Today    Flowsheet Row Most Recent Value  SDOH Interventions   Food Insecurity Interventions Intervention Not Indicated  Housing Interventions Intervention Not Indicated  Transportation Interventions Patient Resources (Friends/Family), Payor Benefit  [patient was explained on how to use medicaid transportation. Pt also recently completed SCAT application with RN Slater and was faxed to [employer] for processing.]  Utilities Interventions Community Resources Provided  [BSW encouraged patient to review utility assistance resources and call for more information/funding availbility.]  Financial Strain Interventions Intervention Not Indicated  [BSW will provide patient a list of vision care providers and send PCP an in-basket about referral to eye provider due to pain in lower bottom pocket of eye.]     Assessment:   Goals Addressed             This Visit's Progress    BSW goal   On track    Current SDOH Barriers:  Financial constraints related to recent stroke Utility resources Dental need Rehab physical therapy need  Interventions: Patient interviewed and appropriate screenings performed Referred patient to community resources  Provided patient with information about utility assistance resources in the community, BSW will provide patient list of dental providers that accept medicaid.           Recommendation:   attend all scheduled provider appointments call for transportation assistance at least one week before appointments ask for help if you don't understand your health insurance benefits Follow up with  RN Slater re forms needing to be sent for critical illness benefit.   Follow Up Plan:   Telephone follow up appointment date/time:  08/25/2024 at 10AM.   [name], BSW [place]/VBCI - [facility] Social Worker [phone]

## 2024-08-17 ENCOUNTER — Other Ambulatory Visit: Payer: Self-pay

## 2024-08-17 ENCOUNTER — Other Ambulatory Visit: Payer: Self-pay | Admitting: Licensed Clinical Social Worker

## 2024-08-17 NOTE — Patient Instructions (Signed)
 Visit Information  Thank you for taking time to visit with me today. Please don't hesitate to contact me if I can be of assistance to you before our next scheduled appointment.  Our next appointment is by telephone on 09/21/2024 at 10:00 AM   Please call the care guide team at [phone] if you need to cancel or reschedule your appointment.   Following is a copy of your care plan:   Goals Addressed             This Visit's Progress    VBCI Social Work Care Plan       Problems:   Transportation             History of CVA             Receiving in home PT, OT, ST             Mobility issues (weakness on left side)              CSW Clinical Goal(s):   Over the next 30  days the Patient will attend all scheduled medical appointments as evidenced by patient report and care team review of appointment completion in electronic medical record.             [name] to participate in scheduled PT sessions in home to increase mobility and improve walking AEB [name] report of improved walking. ([name] wants to be self sufficient with mobility and other daily activities)  Interventions:  [name] reported [name] wants to improve mobility and self sufficiency             [name] wants to work with PCP, Dr. Newlin, to regulate [name] BP as needed.              [name] said [name] enjoys support of Dr. Newlin and finds support of PCP very beneficial.             Discussed medication procurement             Completed assessments as needed. Completed PHQ 2/9. Completed GAD-7             Provided counseling support             Discussed financial needs of [name].Discussed food provision issues. [name] said [name] has adequate food supply. [name] does receive Food Stamps benefit             Discussed therapy services received. [name] receives in home PT, OT and ST as scheduled             [name] has great support from her adult children. [name] has 4 daughters and 3 sons and said [name] has great family support                Discussed transport needs. [name] has been applying for SCAT transport assistance             Discussed support with PCP.              Reviewed pain issues faced               Dicussed program support with RN, LCSW, Pharmacist            Discussed sleep issues. [name] spoke of decreased sleep            [name] said [name] has a portable ramp [name] uses at home as needed to go in and out of home. [name] spoke of using wheelchair  as needed             Encouraged [name] to call LCSW as needed for SW support            [name] was very appreciative of call today from LCSW               Patient Goals/Self-Care Activities:  [name] to attend all scheduled medical appointments             [name] to take medications as prescribed            [name] to call LCSW as needed for SW support            [name] to participate in all scheduled therapy sessions with PT, OT and ST in the home             [name] to communicate her needs with her 7 children to seek support as needed from adult children             [name] to communicate as needed with RN Nichole Jobs regarding [name] nursing needs. [name] is away RN Lauri) will call her on 08/30/24  Plan:   Telephone follow up appointment with care management team member scheduled for:  09/21/24 at 10:00 AM        Please go to [facility] [address] [place] [phone]) if you are experiencing a Mental Health or Behavioral Health Crisis or need someone to talk to.  Patient verbalized understanding of Care plan and visit instructions communicated this visit   Glendia Pear  MSW, LCSW [place]/Value Based Care [facility] [address] Licensed Clinical Social Worker Direct Dial :  [phone] Fax:  [phone] Website:  [email or web address]

## 2024-08-17 NOTE — Patient Outreach (Signed)
 Complex Care Management   Visit Note  08/17/2024  Name:  [name] MRN: [id number] DOB: 1966-01-22  Situation: Referral received for Complex Care Management related to stress issues faced; SDOH needs I obtained verbal consent from Patient.  Visit completed with Patient  on the phone  Background:   Past Medical History:  Diagnosis Date   Clotting disorder    on xarelto    Essential hypertension    Heart murmur    a. 10/2015 Echo: EF 60-65%, no rwma, mild AI/MR, sev dil LA/RA, PASP .   Noncompliance    NSVT (nonsustained ventricular tachycardia) (HCC)    a. 10/2015 during admission for CVA/AF.   Persistent atrial fibrillation (HCC)    a. CHA2DS2VASC = 4-->Xarelto ;  b. 02/2016 Successful DCCV.  c. 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   Transient ischemic attack (TIA) 01/2013    Assessment: Patient Reported Symptoms:  Cognitive Cognitive Status: Alert and oriented to person, place, and time, Able to follow simple commands Cognitive/Intellectual Conditions Management [RPT]: None reported or documented in medical history or problem list   Health Maintenance Behaviors: Stress management Health Facilitated by: Stress management  Neurological Neurological Review of Symptoms: Weakness, Vision changes (vision issues.  difficult sometimes to do reading) Neurological Management Strategies: Adequate rest, Coping strategies  HEENT HEENT Symptoms Reported: Sudden change or loss of vision (may get extra salava in mouth. has spoken to ST about this symptom) HEENT Management Strategies: Adequate rest, Coping strategies    Cardiovascular Cardiovascular Symptoms Reported: No symptoms reported Does patient have uncontrolled Hypertension?: No Cardiovascular Management Strategies: Coping strategies  Respiratory Respiratory Symptoms Reported: No  symptoms reported Respiratory Management Strategies: Adequate rest  Endocrine Endocrine Symptoms Reported: Weakness or fatigue Is patient diabetic?: Yes    Gastrointestinal Gastrointestinal Symptoms Reported: Incontinence, Constipation Gastrointestinal Management Strategies: Adequate rest    Genitourinary Genitourinary Symptoms Reported: Incontinence Genitourinary Management Strategies: Adequate rest  Integumentary Integumentary Symptoms Reported: No symptoms reported Skin Management Strategies: Coping strategies  Musculoskeletal Musculoskelatal Symptoms Reviewed: Muscle pain, Back pain, Unsteady gait, Weakness Musculoskeletal Management Strategies: Coping strategies      Psychosocial Psychosocial Symptoms Reported: Sadness - if selected complete PHQ 2-9, Anxiety - if selected complete GAD, Depression - if selected complete PHQ 2-9 Additional Psychological Details: Hx of CVA Behavioral Management Strategies: Adequate rest, Coping strategies, Support group Behavioral Health Comment: has 4 dtrs and 3 sons. Has good family support Major Change/Loss/Stressor/Fears (CP): Medical condition, self Techniques to Cope with Loss/Stress/Change: Counseling, Diversional activities Quality of Family Relationships: supportive Do you feel physically threatened by others?: No Has strong family support    08/17/2024    PHQ2-9 Depression Screening   Little interest or pleasure in doing things Several days  Feeling down, depressed, or hopeless More than half the days  PHQ-2 - Total Score 3  Trouble falling or staying asleep, or sleeping too much More than half the days  Feeling tired or having little energy More than half the days  Poor appetite or overeating  Several days  Feeling bad about yourself - or that you are a failure or have let yourself or your family down Several days  Trouble concentrating on things, such as reading the newspaper or watching television Several days  Moving or speaking so  slowly that other people could have noticed.  Or the opposite -  being so fidgety or restless that you have been moving around a lot more than usual Several days  Thoughts that you would be better off dead, or hurting yourself in some way Not at all  PHQ2-9 Total Score 11  If you checked off any problems, how difficult have these problems made it for you to do your work, take care of things at home, or get along with other people Somewhat difficult  Depression Interventions/Treatment Counseling    Today's Vitals  BP varies per client information  Pain Scale: 0-10 Pain Score: 0-No pain  Medications Reviewed Today     Reviewed by [name] (Social Worker) on 08/17/24 at [phone]  Med List Status: <None>   Medication Order Taking? Sig Documenting Provider Last Dose Status Informant  Accu-Chek FastClix Lancets MISC [id number] unknown USE AS INSTRUCTED TO CHECK BLOOD SUGAR ONCE DAILY. Newlin, Enobong, MD  Active Child, Pharmacy Records  acetaminophen  (TYLENOL ) 325 MG tablet [id number] Yes Take 1-2 tablets (325-650 mg total) by mouth every 4 (four) hours as needed for mild pain (pain score 1-3). Jerilynn Daphne SAILOR, NP  Active    Patient not taking:   Discontinued 03/28/21 1020 baclofen  (LIORESAL ) 10 MG tablet [id number] Yes Take 1 tablet (10 mg total) by mouth 3 (three) times daily. Newlin, Enobong, MD  Active   Blood Glucose Monitoring Suppl (ACCU-CHEK GUIDE ME) w/Device KIT [id number] Yes 1 kit by Does not apply route daily. Delbert Clam, MD  Active Child, Pharmacy Records  Continuous Glucose Receiver (FREESTYLE LIBRE 3 READER) [place]ID[place] [id number] Not taking  Apply as directed to test blood glucose  Patient not taking: Reported on 08/17/2024   Newlin, Enobong, MD  Active Child, Pharmacy Records  Continuous Glucose Sensor (FREESTYLE LIBRE 3 [place]) [place] [id number] Not taking  Place 1 sensor on the skin every 14 days. Use to check glucose continuously  Patient not taking: Reported on 08/17/2024    Newlin, Enobong, MD  Active Child, Pharmacy Records  feeding supplement (ENSURE PLUS HIGH PROTEIN) LIQD [id number] Not taking  Take 237 mLs by mouth daily.  Patient not taking: Reported on 08/17/2024   Jerilynn Daphne SAILOR, NP  Active    Patient not taking:   Discontinued 03/28/21 1020 gabapentin  (NEURONTIN ) 300 MG capsule [id number] Yes Take 2 capsules (600 mg total) by mouth at bedtime. Newlin, Enobong, MD  Active   glucose blood test strip [id number] Yes Use as instructed Newlin, Enobong, MD  Active Child, Pharmacy Records  lidocaine  (XYLOCAINE ) 2 % solution [id number] Yes Use as directed 15 mLs in the mouth or throat every 6 (six) hours as needed for mouth pain. Newlin, Enobong, MD  Active   metoprolol  succinate (TOPROL -XL) 100 MG 24 hr tablet [id number] Unknown  Take 1 tablet (100 mg total) by mouth daily. Take with or immediately following a meal. Take with 50mg  to equal 150mg  daily. Croitoru, Mihai, MD  Active   metoprolol  succinate (TOPROL -XL) 50 MG 24 hr tablet [id number] Unknown  Take 1 tablet (50 mg total) by mouth daily. Take with or immediately following a meal.Take with 100mg  to equal 150mg  daily. Croitoru, Jerel, MD  Active   Misc. Devices MISC [id number] unknown Resting hand splint Delbert Clam, MD  Active   nitrofurantoin , macrocrystal-monohydrate, (MACROBID ) 100 MG capsule [id number] Not taking  Take 1 capsule (100 mg total) by mouth 2 (two) times daily.  Patient not taking: Reported on 08/08/2024   Newlin, Enobong, MD  Active   Nystatin (GERHARDT'S BUTT CREAM) CREA [id number] Not taking  Apply 1 Application topically 3 (three) times daily.  Patient not taking: Reported on 08/17/2024   Jerilynn Daphne SAILOR, NP  Active   phenazopyridine  (PYRIDIUM ) 95 MG tablet [id number] Unknown Take 1 tablet (95 mg total) by mouth 3 (three) times daily as needed (urinary symptoms). Newlin, Enobong, MD  Active   polyethylene glycol powder (GLYCOLAX /MIRALAX ) 17 GM/SCOOP powder [id number] Not taking  Dissolve 1  capful (17g) in 4-8 ounces of liquid and take by mouth daily.  Patient not taking: Reported on 08/17/2024   Jerilynn Daphne SAILOR, NP  Active   rivaroxaban  (XARELTO ) 20 MG TABS tablet [id number] Yes Take 1 tablet (20 mg total) by mouth daily with supper. Newlin, Enobong, MD  Active   rosuvastatin  (CRESTOR ) 40 MG tablet [id number] Yes Take 1 tablet (40 mg total) by mouth daily. Newlin, Enobong, MD  Active   senna-docusate (SENOKOT-S) 8.6-50 MG tablet [id number] Yes Take 1 tablet by mouth at bedtime. Jerilynn Daphne SAILOR, NP  Active   spironolactone  (ALDACTONE ) 25 MG tablet [id number] Yes Take 0.5 tablets (12.5 mg total) by mouth daily. Newlin, Enobong, MD  Active   sulfamethoxazole -trimethoprim  (BACTRIM  DS) 800-160 MG tablet [id number] Yes Take 1 tablet by mouth 2 (two) times daily. Newlin, Enobong, MD  Active   triamcinolone  cream (KENALOG ) 0.1 % [id number] Unknown  Apply 1 Application topically 2 (two) times daily. Newlin, Enobong, MD  Active             Recommendation:   PCP Follow-up Continue Current Plan of Care Call LCSW as needed for SW support Participate in scheduled therapy sessions in the home with PT, OT and ST as needed Communicate needs with her 7 adult children to enlist support as needed from her adult children  Follow Up Plan:   LCSW to call client on 09/21/2024 at 10:00 AM   Glendia Pear  MSW, LCSW [place]/Value Based Care [facility] Licensed Clinical Social Worker Direct Dial :  [phone] Fax:  [phone] Website:  [email or web address]

## 2024-08-21 ENCOUNTER — Other Ambulatory Visit: Payer: Self-pay | Admitting: Family Medicine

## 2024-08-22 ENCOUNTER — Other Ambulatory Visit: Payer: Self-pay

## 2024-08-23 ENCOUNTER — Other Ambulatory Visit: Payer: Self-pay

## 2024-08-23 ENCOUNTER — Telehealth: Payer: Self-pay | Admitting: Family Medicine

## 2024-08-23 MED ORDER — BACLOFEN 10 MG PO TABS
10.0000 mg | ORAL_TABLET | Freq: Three times a day (TID) | ORAL | 2 refills | Status: AC | PRN
  Filled 2024-08-23: qty 60, 20d supply, fill #0
  Filled 2024-10-11: qty 60, 20d supply, fill #1

## 2024-08-23 NOTE — Telephone Encounter (Signed)
 Copied from CRM #[id number]. Topic: General - Other >> Aug 23, 2024  1:05 PM Zebedee SAUNDERS wrote:  Reason for CRM: Pt called stated she spoke with Slater who stated pt was approved for outpatient therapy. Pt would like to start therapy as soon as possible. Pt would like a call back at [phone].

## 2024-08-23 NOTE — Therapy (Signed)
 OUTPATIENT PHYSICAL THERAPY NEURO EVALUATION   Patient Name: [name] MRN: [id number] DOB:03-19-1966, 58 y.o., female Today's Date: 08/23/2024   PCP: PIERRETTE REFERRING PROVIDER: Delbert Clam, MD  END OF SESSION:   Past Medical History:  Diagnosis Date   Clotting disorder    on xarelto    Essential hypertension    Heart murmur    a. 10/2015 Echo: EF 60-65%, no rwma, mild AI/MR, sev dil LA/RA, PASP .   Noncompliance    NSVT (nonsustained ventricular tachycardia) (HCC)    a. 10/2015 during admission for CVA/AF.   Persistent atrial fibrillation (HCC)    a. CHA2DS2VASC = 4-->Xarelto ;  b. 02/2016 Successful DCCV.  c. Recurrent fib   Pneumonia 2012 x 2; 2015   Stroke [facility])    a. 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   Transient ischemic attack (TIA) 01/2013   Past Surgical History:  Procedure Laterality Date   CARDIOVERSION N/A 02/18/2013   Procedure: CARDIOVERSION;  Surgeon: Jerel Balding, MD;  Location: [facility] ENDOSCOPY;  Service: Cardiovascular;  Laterality: N/A;   CARDIOVERSION N/A 02/23/2013   Procedure: CARDIOVERSION;  Surgeon: Alm LELON Clay, MD;  Location: [facility] OR;  Service: Cardiovascular;  Laterality: N/A;  BESIDE CV   CARDIOVERSION N/A 03/12/2016   Procedure: CARDIOVERSION;  Surgeon: Ezra GORMAN Shuck, MD;  Location: [facility] ENDOSCOPY;  Service: Cardiovascular;  Laterality: N/A;   IR PERCUTANEOUS ART THROMBECTOMY/INFUSION INTRACRANIAL INC DIAG ANGIO  05/05/2024   IR US  GUIDE VASC ACCESS RIGHT  05/05/2024   RADIOLOGY WITH ANESTHESIA N/A 05/05/2024   Procedure: RADIOLOGY WITH ANESTHESIA;  Surgeon: Radiologist, Medication, MD;  Location: [facility] OR;  Service: Radiology;  Laterality: N/A;   RIGHT/LEFT HEART CATH AND CORONARY ANGIOGRAPHY N/A 04/02/2021   Procedure: RIGHT/LEFT HEART CATH AND CORONARY ANGIOGRAPHY;  Surgeon: Claudene Victory LELON, MD;  Location: [facility] INVASIVE CV LAB;  Service: Cardiovascular;   Laterality: N/A;   TEE WITHOUT CARDIOVERSION N/A 02/18/2013   Procedure: TRANSESOPHAGEAL ECHOCARDIOGRAM (TEE);  Surgeon: Jerel Balding, MD;  Location: [facility] ENDOSCOPY;  Service: Cardiovascular;  Laterality: N/A;   TUBAL LIGATION  1992   Patient Active Problem List   Diagnosis Date Noted   Permanent atrial fibrillation (HCC) 07/10/2024   Coping style affecting medical condition 05/13/2024   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 2 without complication (HCC) 05/11/2024   Dysphagia due to recent stroke 05/11/2024   Acute right ACA stroke (HCC) 05/09/2024   Acute ischemic right MCA stroke (HCC) 05/05/2024   Stroke (cerebrum) (HCC) 05/05/2024   Type 2 diabetes mellitus without complication, without long-term current use of insulin  (HCC) 06/17/2021   Coronary artery disease involving native coronary artery of native heart without angina pectoris 04/10/2021   Chronic combined systolic and diastolic CHF (congestive heart failure) (HCC) 04/01/2021   Longstanding persistent atrial fibrillation (HCC) 04/16/2020   Secondary hypercoagulable state 04/16/2020   Edema leg 05/17/2019   Severe obesity (BMI 35.0-39.9) with comorbidity (HCC) 04/04/2019   Prediabetes 04/04/2019   Seasonal allergic rhinitis due to pollen 12/14/2018   Chronic back pain 08/23/2018   OSA on CPAP 06/23/2018   Headache 01/29/2017   Refractive errors 04/15/2016   Medical non-compliance 11/06/2015   Hyperlipidemia LDL goal <70 11/06/2015   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   NSVT (nonsustained ventricular tachycardia) (HCC) 11/06/2015   History of CVA (cerebrovascular accident)    Claudication of both lower extremities 03/22/2015   Chronic atrial fibrillation (HCC) 04/12/2014   Pap smear for  cervical cancer screening 03/28/2014   Cigarette smoker 06/15/2013   Ventral hernia 06/15/2013   Chronic anticoagulation 02/28/2013   Hx-TIA (transient ischemic attack) 02/05/13 02/15/2013   Essential  hypertension 02/15/2013    ONSET DATE: 08/04/2024 (date of referral)  REFERRING DIAG: I69.354 (ICD-10-CM) - Hemiparesis affecting left side as late effect of stroke (HCC)  THERAPY DIAG:  No diagnosis found.  Rationale for Evaluation and Treatment: Rehabilitation  SUBJECTIVE:                                                                                                                                                                                             SUBJECTIVE STATEMENT: *** Pt accompanied by: {accompnied:27141}  PERTINENT HISTORY: ***Receiving HHPT but scheduled to stop 08/11/24 d/t insurance issues.  PMH includes htn, NSVT, a-fib/atrial flutter( s/p cardioversion 02/2016), stroke (cerebral infarction due to embolism of right middle cerebral artery status post IV TPA (in 10/2015),  right MCA stroke in 01/2018 (after running out of Xarelto ), right ACA stroke in 04/2024 (status post thrombectomy with residual left hemiparesis, dysarthria), neuropathic pain, and DM type 2.  PAIN:  Are you having pain? {OPRCPAIN:27236}  PRECAUTIONS: {Therapy precautions:24002}L leg brace?***  RED FLAGS: {PT Red Flags:29287}   WEIGHT BEARING RESTRICTIONS: {Yes ***/No:24003}  FALLS: Has patient fallen in last 6 months? {fallsyesno:27318}  LIVING ENVIRONMENT: Lives with: {OPRC lives with:25569::lives with their family} Lives in: {Lives in:25570} Stairs: {opstairs:27293} Has following equipment at home: {Assistive devices:23999}  PLOF: {PLOF:24004}Has aide 5 days/week for 2.5 hours each day.  PATIENT GOALS: ***  OBJECTIVE:  Note: Objective measures were completed at Evaluation unless otherwise noted.  DIAGNOSTIC FINDINGS: ***  COGNITION: Overall cognitive status: {cognition:24006}   SENSATION: {sensation:27233}  COORDINATION: ***  EDEMA:  {edema:24020}  MUSCLE TONE: {LE tone:25568}L UE/LE spasticity  MUSCLE LENGTH: Hamstrings: Right *** deg; Left *** deg Debby test:  Right *** deg; Left *** deg  DTRs:  {DTR SITE:24025}  POSTURE: {posture:25561}  LOWER EXTREMITY ROM:     {AROM/PROM:27142}  Right Eval Left Eval  Hip flexion    Hip extension    Hip abduction    Hip adduction    Hip internal rotation    Hip external rotation    Knee flexion    Knee extension    Ankle dorsiflexion    Ankle plantarflexion    Ankle inversion    Ankle eversion     (Blank rows = not tested)  LOWER EXTREMITY MMT:    MMT Right Eval Left Eval  Hip flexion    Hip extension    Hip abduction    Hip adduction    Hip  internal rotation    Hip external rotation    Knee flexion    Knee extension    Ankle dorsiflexion    Ankle plantarflexion    Ankle inversion    Ankle eversion    (Blank rows = not tested)  BED MOBILITY:  {bed mobility:32615:p}  TRANSFERS: {transfers eval:32620}  RAMP:  {ramp eval:32616}  CURB:  {curb eval:32617}  STAIRS: {stairs eval:32618} GAIT: Findings: {GaitneuroPT:32644::Distance walked: ***,Comments: ***}  FUNCTIONAL TESTS:  {Functional tests:24029}  PATIENT SURVEYS:  {rehab surveys:24030}                                                                                                                              TREATMENT DATE: ***    PATIENT EDUCATION: Education details: ***Eval results; POC. Person educated: {Person educated:25204} Education method: {Education Method:25205} Education comprehension: {Education Comprehension:25206}  HOME EXERCISE PROGRAM: ***  GOALS: Goals reviewed with patient? Yes  SHORT TERM GOALS: Target date: ***  *** Baseline: Goal status: INITIAL  2.  *** Baseline:  Goal status: INITIAL  3.  *** Baseline:  Goal status: INITIAL  4.  *** Baseline:  Goal status: INITIAL  5.  *** Baseline:  Goal status: INITIAL  6.  *** Baseline:  Goal status: INITIAL  LONG TERM GOALS: Target date: ***  *** Baseline:  Goal status: INITIAL  2.  *** Baseline:  Goal  status: INITIAL  3.  *** Baseline:  Goal status: INITIAL  4.  *** Baseline:  Goal status: INITIAL  5.  *** Baseline:  Goal status: INITIAL  6.  *** Baseline:  Goal status: INITIAL  ASSESSMENT:  CLINICAL IMPRESSION: Patient is a 58 y.o. female who was seen today for physical therapy evaluation and treatment for CVA.  Patient presents with ***. These impairments are limiting patient from ***.  Evaluation included the following assessment tools: ***.   Patient will benefit from skilled PT to address noted impairments, improve overall function, and progress towards long term goals.  OBJECTIVE IMPAIRMENTS: {opptimpairments:25111}.   ACTIVITY LIMITATIONS: {activitylimitations:27494}  PARTICIPATION LIMITATIONS: {participationrestrictions:25113}  PERSONAL FACTORS: {Personal factors:25162} are also affecting patient's functional outcome.   REHAB POTENTIAL: {rehabpotential:25112}  CLINICAL DECISION MAKING: {clinical decision making:25114}  EVALUATION COMPLEXITY: {Evaluation complexity:25115}  PLAN:  PT FREQUENCY: {rehab frequency:25116}  PT DURATION: {rehab duration:25117}  PLANNED INTERVENTIONS: {rehab planned interventions:25118::97110-Therapeutic exercises,97530- Therapeutic [phone]- Neuromuscular re-education,97535- Self Rjmz,02859- Manual therapy,Patient/Family education}  PLAN FOR NEXT SESSION: ***   Damien Caulk, PT 08/23/2024, 10:30 AM

## 2024-08-24 NOTE — Telephone Encounter (Signed)
 I returned the call to the patient and told her that she has an appointment at outpatient rehab tomorrow, 08/25/2024 @ 1530.  She said she was not aware of that but she checked with her daughter, [name], who was aware of the appointment.  I explained to the patient that if she is going to outpatient PT, she can't receive home PT and she said she understood.   Her insurance will not pay for outpatient and home health PT at the same time.

## 2024-08-25 ENCOUNTER — Ambulatory Visit: Admitting: Physical Therapy

## 2024-08-25 ENCOUNTER — Encounter: Payer: Self-pay | Admitting: Physical Therapy

## 2024-08-25 ENCOUNTER — Other Ambulatory Visit: Payer: Self-pay

## 2024-08-25 VITALS — BP 120/58 | HR 64

## 2024-08-25 DIAGNOSIS — R2689 Other abnormalities of gait and mobility: Secondary | ICD-10-CM | POA: Diagnosis present

## 2024-08-25 DIAGNOSIS — R278 Other lack of coordination: Secondary | ICD-10-CM | POA: Insufficient documentation

## 2024-08-25 DIAGNOSIS — I69354 Hemiplegia and hemiparesis following cerebral infarction affecting left non-dominant side: Secondary | ICD-10-CM | POA: Insufficient documentation

## 2024-08-25 DIAGNOSIS — M6281 Muscle weakness (generalized): Secondary | ICD-10-CM

## 2024-08-25 NOTE — Patient Instructions (Signed)
 [name] - I am sorry I was unable to reach you today for our scheduled appointment. I work with Newlin, Enobong, MD and am calling to support your healthcare needs. Please contact me at [phone] at your earliest convenience. I look forward to speaking with you soon.   Thank you,   [name], BSW [place]/VBCI - [facility] Social Worker [phone]

## 2024-08-27 DIAGNOSIS — E119 Type 2 diabetes mellitus without complications: Secondary | ICD-10-CM | POA: Diagnosis not present

## 2024-08-27 DIAGNOSIS — R32 Unspecified urinary incontinence: Secondary | ICD-10-CM | POA: Diagnosis not present

## 2024-08-27 DIAGNOSIS — I10 Essential (primary) hypertension: Secondary | ICD-10-CM | POA: Diagnosis not present

## 2024-08-30 ENCOUNTER — Other Ambulatory Visit: Payer: Self-pay | Admitting: *Deleted

## 2024-08-30 ENCOUNTER — Other Ambulatory Visit: Payer: Self-pay

## 2024-08-30 NOTE — Patient Instructions (Signed)
 Visit Information  Ms. Salsberry was given information about Medicaid Managed Care team care coordination services as a part of their [facility] Community Plan Medicaid benefit.   If you would like to schedule transportation through your [facility], please call the following number at least 2 days in advance of your appointment: [phone]   Rides for urgent appointments can also be made after hours by calling Member Services.  Call the Behavioral Health Crisis Line at [phone], at any time, 24 hours a day, 7 days a week. If you are in danger or need immediate medical attention call 911.  Please see education materials related to HTN provided by MyChart link.  Care plan and visit instructions communicated with the patient verbally today. Patient agrees to receive a copy in MyChart. Active MyChart status and patient understanding of how to access instructions and care plan via MyChart confirmed with patient.     Telephone follow up appointment with Managed Medicaid care management team member scheduled for: 10/03/24 @ 11 am.  Nikeisha Klutz, RN, BSN, ACM RN Care Manager [facility] [phone]   Following is a copy of your plan of care:   Goals Addressed   None

## 2024-08-30 NOTE — Patient Outreach (Signed)
 Complex Care Management   Visit Note  08/30/2024  Name:  [name] MRN: [id number] DOB: 1965-12-12  Situation: Referral received for Complex Care Management related to HTN I obtained verbal consent from Patient.  Visit completed with Patient  on the phone  Background:   Past Medical History:  Diagnosis Date   Clotting disorder    on xarelto    Essential hypertension    Heart murmur    a. 10/2015 Echo: EF 60-65%, no rwma, mild AI/MR, sev dil LA/RA, PASP .   Noncompliance    NSVT (nonsustained ventricular tachycardia) (HCC)    a. 10/2015 during admission for CVA/AF.   Persistent atrial fibrillation (HCC)    a. CHA2DS2VASC = 4-->Xarelto ;  b. 02/2016 Successful DCCV.  c. Recurrent fib   Pneumonia 2012 x 2; 2015   Stroke [facility])    a. 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   Transient ischemic attack (TIA) 01/2013    Assessment:  Outreach completed today.  I was unable to completed Assessment.  Patient's phone is died and we were unable to completed the call.  I reached out to Dr. Millard office to inform the office that patient needs her FMLA forms filled out for her job.  Office informs me that FMLA forms where completed for her daughter and not for the patient's job.  I called patient back to make her aware.  I was able to let her know this information and her phone died again.  I will continue in my efforts to reach out to patient.   Patient Reported Symptoms:  Cognitive        Neurological      HEENT        Cardiovascular      Respiratory      Endocrine      Gastrointestinal        Genitourinary      Integumentary      Musculoskeletal          Psychosocial       Quality of Family Relationships: supportive Do you feel physically threatened by others?: No    08/30/2024    PHQ2-9 Depression Screening   Little interest or pleasure in doing things    Feeling down,  depressed, or hopeless    PHQ-2 - Total Score    Trouble falling or staying asleep, or sleeping too much    Feeling tired or having little energy    Poor appetite or overeating     Feeling bad about yourself - or that you are a failure or have let yourself or your family down    Trouble concentrating on things, such as reading the newspaper or watching television    Moving or speaking so slowly that other people could have noticed.  Or the opposite - being so fidgety or restless that you have been moving around a lot more than usual    Thoughts that you would be better off dead, or hurting yourself in some way    PHQ2-9 Total Score    If you checked off any problems, how difficult have these problems made it for you to do your work, take care of things at home, or get along with other people    Depression Interventions/Treatment      There were no vitals filed for this visit.    Medications Reviewed Today     Reviewed by Nykolas Bacallao A, RN (Case  Manager) on 08/30/24 at 1211  Med List Status: <None>   Medication Order Taking? Sig Documenting Provider Last Dose Status Informant  Accu-Chek FastClix Lancets MISC [id number] Yes USE AS INSTRUCTED TO CHECK BLOOD SUGAR ONCE DAILY. Newlin, Enobong, MD  Active Child, Pharmacy Records  acetaminophen  (TYLENOL ) 325 MG tablet [id number] Yes Take 1-2 tablets (325-650 mg total) by mouth every 4 (four) hours as needed for mild pain (pain score 1-3). Jerilynn Daphne SAILOR, NP  Active    Patient not taking:   Discontinued 03/28/21 1020 baclofen  (LIORESAL ) 10 MG tablet [id number]  Take 1 tablet (10 mg total) by mouth 3 (three) times daily as needed for muscle spasms.  Patient not taking: Reported on 08/30/2024   Newlin, Enobong, MD  Active   Blood Glucose Monitoring Suppl (ACCU-CHEK GUIDE ME) w/Device KIT [id number] Yes 1 kit by Does not apply route daily. Delbert Clam, MD  Active Child, Pharmacy Records  Continuous Glucose Receiver (FREESTYLE LIBRE 3 READER)  ESPIRIDION [id number]  Apply as directed to test blood glucose  Patient not taking: Reported on 08/30/2024   Newlin, Enobong, MD  Active Child, Pharmacy Records  Continuous Glucose Sensor (FREESTYLE LIBRE 3 [place]) [place] [id number]  Place 1 sensor on the skin every 14 days. Use to check glucose continuously  Patient not taking: Reported on 08/30/2024   Newlin, Enobong, MD  Active Child, Pharmacy Records  feeding supplement (ENSURE PLUS HIGH PROTEIN) LIQD [id number]  Take 237 mLs by mouth daily.  Patient not taking: Reported on 08/30/2024   Jerilynn Daphne SAILOR, NP  Active    Patient not taking:   Discontinued 03/28/21 1020 gabapentin  (NEURONTIN ) 300 MG capsule [id number] Yes Take 2 capsules (600 mg total) by mouth at bedtime. Newlin, Enobong, MD  Active   glucose blood test strip [id number] Yes Use as instructed Newlin, Enobong, MD  Active Child, Pharmacy Records  lidocaine  (XYLOCAINE ) 2 % solution [id number] Yes Use as directed 15 mLs in the mouth or throat every 6 (six) hours as needed for mouth pain. Newlin, Enobong, MD  Active   metoprolol  succinate (TOPROL -XL) 100 MG 24 hr tablet [id number] Yes Take 1 tablet (100 mg total) by mouth daily. Take with or immediately following a meal. Take with 50mg  to equal 150mg  daily. Croitoru, Mihai, MD  Active   metoprolol  succinate (TOPROL -XL) 50 MG 24 hr tablet [id number] Yes Take 1 tablet (50 mg total) by mouth daily. Take with or immediately following a meal.Take with 100mg  to equal 150mg  daily. Croitoru, Jerel, MD  Active   Misc. Devices MISC [id number] Yes Resting hand splint Delbert Clam, MD  Active   nitrofurantoin , macrocrystal-monohydrate, (MACROBID ) 100 MG capsule [id number]  Take 1 capsule (100 mg total) by mouth 2 (two) times daily.  Patient not taking: Reported on 08/30/2024   Newlin, Enobong, MD  Active   Nystatin (GERHARDT'S BUTT CREAM) CREA [id number]  Apply 1 Application topically 3 (three) times daily.  Patient not taking: Reported on 08/30/2024    Jerilynn Daphne SAILOR, NP  Active   phenazopyridine  (PYRIDIUM ) 95 MG tablet [id number] Yes Take 1 tablet (95 mg total) by mouth 3 (three) times daily as needed (urinary symptoms). Newlin, Enobong, MD  Active   polyethylene glycol powder (GLYCOLAX /MIRALAX ) 17 GM/SCOOP powder [id number]  Dissolve 1 capful (17g) in 4-8 ounces of liquid and take by mouth daily.  Patient not taking: Reported on 08/30/2024   Jerilynn Daphne SAILOR, NP  Active   rivaroxaban  (XARELTO ) 20 MG TABS tablet [id number] Yes Take 1 tablet (20 mg total)  by mouth daily with supper. Newlin, Enobong, MD  Active   rosuvastatin  (CRESTOR ) 40 MG tablet [id number] Yes Take 1 tablet (40 mg total) by mouth daily. Newlin, Enobong, MD  Active   senna-docusate (SENOKOT-S) 8.6-50 MG tablet [id number] Yes Take 1 tablet by mouth at bedtime. Jerilynn Daphne SAILOR, NP  Active   spironolactone  (ALDACTONE ) 25 MG tablet [id number] Yes Take 0.5 tablets (12.5 mg total) by mouth daily. Newlin, Enobong, MD  Active   sulfamethoxazole -trimethoprim  (BACTRIM  DS) 800-160 MG tablet [id number] Yes Take 1 tablet by mouth 2 (two) times daily. Newlin, Enobong, MD  Active   triamcinolone  cream (KENALOG ) 0.1 % [id number] Yes Apply 1 Application topically 2 (two) times daily. Newlin, Enobong, MD  Active             Recommendation:   PCP Follow-up Continue Current Plan of Care  Follow Up Plan:   Telephone follow-up in 1 month: 10/03/24 @ 11 am.  Lucette Kratz, RN, BSN, ACM RN Care Manager [employer] [phone]

## 2024-09-01 ENCOUNTER — Other Ambulatory Visit: Payer: Self-pay

## 2024-09-01 NOTE — Patient Outreach (Signed)
 Social Drivers of Health  Community Resource and Care Coordination Visit Note   09/01/2024  Name: [name] MRN: [id number] DOB:[birth year]  Situation: Referral received for [facility] needs assessment and assistance related to utility assistance. I obtained verbal consent from Patient.  Visit completed with Patient on the phone.   Background:      Assessment:   Goals Addressed             This Visit's Progress    BSW goal       Current SDOH Barriers:  Financial constraints related to recent stroke Utility resources Dental need Rehab physical therapy need  Interventions: Patient interviewed and appropriate screenings performed Referred patient to community resources  Provided patient with information about utility assistance resources in the community, BSW will provide patient list of dental providers that accept medicaid.  09/01/2024 Pt informed BSW she has outpatient rehab set up for January and February 2026. Pt was provided with member services phone number for [facility] since dental provider list did not work for her. Pt will be calling to find in-network provider.  Pt mentioned needing referrals for other therapy services that will be provided at the rehab center. Pt was encouraged to f/u with Dr. Newlin about these referrals.         Recommendation:   attend all scheduled provider appointments call for transportation assistance at least one week before appointments ask for help if you don't understand your health insurance benefits  Follow Up Plan:   Telephone follow up appointment date/time:  09/23/2024 at 10AM.  [name], BSW [place]/VBCI - [facility] Social Worker [phone]

## 2024-09-01 NOTE — Patient Instructions (Signed)
 Visit Information  Ms. Ontko was given information about Medicaid Managed Care team care coordination services as a part of their [facility] Community Plan Medicaid benefit.   If you would like to schedule transportation through your [facility], please call the following number at least 2 days in advance of your appointment: [phone]   Rides for urgent appointments can also be made after hours by calling Member Services.  Call the Behavioral Health Crisis Line at [phone], at any time, 24 hours a day, 7 days a week. If you are in danger or need immediate medical attention call 911.   Patient verbalizes understanding of instructions and care plan provided today and agrees to view in MyChart. Active MyChart status and patient understanding of how to access instructions and care plan via MyChart confirmed with patient.     Telephone follow up appointment with Managed Medicaid care management team member scheduled for: 09/23/2024 at 10AM.  [name], BSW [place]/VBCI - [facility] Social Worker [phone]   Following is a copy of your plan of care:   Goals Addressed             This Visit's Progress    BSW goal       Current SDOH Barriers:  Financial constraints related to recent stroke Utility resources Dental need Rehab physical therapy need  Interventions: Patient interviewed and appropriate screenings performed Referred patient to community resources  Provided patient with information about utility assistance resources in the community, BSW will provide patient list of dental providers that accept medicaid.  09/01/2024 Pt informed BSW she has outpatient rehab set up for January and February 2026. Pt was provided with member services phone number for [facility] since dental provider list did not work for her. Pt will be calling to find in-network provider.  Pt mentioned needing referrals for other therapy services that will  be provided at the rehab center. Pt was encouraged to f/u with Dr. Newlin about these referrals.

## 2024-09-12 ENCOUNTER — Other Ambulatory Visit (HOSPITAL_COMMUNITY): Payer: Self-pay

## 2024-09-12 ENCOUNTER — Other Ambulatory Visit: Payer: Self-pay

## 2024-09-14 ENCOUNTER — Other Ambulatory Visit: Payer: Self-pay

## 2024-09-19 ENCOUNTER — Ambulatory Visit: Attending: Family Medicine | Admitting: Physical Therapy

## 2024-09-19 ENCOUNTER — Encounter: Payer: Self-pay | Admitting: Physical Therapy

## 2024-09-19 VITALS — BP 114/74 | HR 74

## 2024-09-19 DIAGNOSIS — R278 Other lack of coordination: Secondary | ICD-10-CM | POA: Insufficient documentation

## 2024-09-19 DIAGNOSIS — M6281 Muscle weakness (generalized): Secondary | ICD-10-CM | POA: Insufficient documentation

## 2024-09-19 DIAGNOSIS — I69354 Hemiplegia and hemiparesis following cerebral infarction affecting left non-dominant side: Secondary | ICD-10-CM | POA: Insufficient documentation

## 2024-09-19 DIAGNOSIS — R2689 Other abnormalities of gait and mobility: Secondary | ICD-10-CM | POA: Insufficient documentation

## 2024-09-19 NOTE — Therapy (Signed)
 " OUTPATIENT PHYSICAL THERAPY NEURO TREATMENT   Patient Name: [name] MRN: [id number] DOB:[birth year], 59 y.o., female Today's Date: 09/19/2024   PCP: Delbert Clam, MD REFERRING PROVIDER: Delbert Clam, MD  END OF SESSION:  PT End of Session - 09/19/24 1022     Visit Number 2    Number of Visits 17   16 visits plus Eval   Date for Recertification  11/04/24   for scheduling delays   Authorization Type [facility] Medicaid    Authorization - Visit Number 2    Authorization - Number of Visits 27    PT Start Time 1018   pt arrived late   PT Stop Time 1103    PT Time Calculation (min) 45 min    Equipment Utilized During Treatment Gait belt    Activity Tolerance Patient tolerated treatment well    Behavior During Therapy WFL for tasks assessed/performed          Past Medical History:  Diagnosis Date   Clotting disorder    on xarelto    Essential hypertension    Heart murmur    a. 10/2015 Echo: EF 60-65%, no rwma, mild AI/MR, sev dil LA/RA, PASP .   Noncompliance    NSVT (nonsustained ventricular tachycardia) (HCC)    a. 10/2015 during admission for CVA/AF.   Persistent atrial fibrillation (HCC)    a. CHA2DS2VASC = 4-->Xarelto ;  b. 02/2016 Successful DCCV.  c. Recurrent fib   Pneumonia 2012 x 2; 2015   Stroke [facility])    a. 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   Transient ischemic attack (TIA) 01/2013   Past Surgical History:  Procedure Laterality Date   CARDIOVERSION N/A 02/18/2013   Procedure: CARDIOVERSION;  Surgeon: Jerel Balding, MD;  Location: [facility] ENDOSCOPY;  Service: Cardiovascular;  Laterality: N/A;   CARDIOVERSION N/A 02/23/2013   Procedure: CARDIOVERSION;  Surgeon: Alm LELON Clay, MD;  Location: [facility] OR;  Service: Cardiovascular;  Laterality: N/A;  BESIDE CV   CARDIOVERSION N/A 03/12/2016   Procedure: CARDIOVERSION;  Surgeon: Ezra GORMAN Shuck, MD;  Location: [facility] ENDOSCOPY;  Service:  Cardiovascular;  Laterality: N/A;   IR PERCUTANEOUS ART THROMBECTOMY/INFUSION INTRACRANIAL INC DIAG ANGIO  05/05/2024   IR US  GUIDE VASC ACCESS RIGHT  05/05/2024   RADIOLOGY WITH ANESTHESIA N/A 05/05/2024   Procedure: RADIOLOGY WITH ANESTHESIA;  Surgeon: Radiologist, Medication, MD;  Location: [facility] OR;  Service: Radiology;  Laterality: N/A;   RIGHT/LEFT HEART CATH AND CORONARY ANGIOGRAPHY N/A 04/02/2021   Procedure: RIGHT/LEFT HEART CATH AND CORONARY ANGIOGRAPHY;  Surgeon: Claudene Victory LELON, MD;  Location: [facility] INVASIVE CV LAB;  Service: Cardiovascular;  Laterality: N/A;   TEE WITHOUT CARDIOVERSION N/A 02/18/2013   Procedure: TRANSESOPHAGEAL ECHOCARDIOGRAM (TEE);  Surgeon: Jerel Balding, MD;  Location: [facility] ENDOSCOPY;  Service: Cardiovascular;  Laterality: N/A;   TUBAL LIGATION  1992   Patient Active Problem List   Diagnosis Date Noted   Permanent atrial fibrillation (HCC) 07/10/2024   Coping style affecting medical condition 05/13/2024   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 2 without complication (HCC) 05/11/2024   Dysphagia due to recent stroke 05/11/2024   Acute right ACA stroke (HCC) 05/09/2024   Acute ischemic right MCA stroke (HCC) 05/05/2024   Stroke (cerebrum) (HCC) 05/05/2024   Type 2 diabetes mellitus without complication, without long-term current use of insulin  (HCC) 06/17/2021   Coronary artery disease involving native coronary artery of native heart without angina pectoris 04/10/2021  Chronic combined systolic and diastolic CHF (congestive heart failure) (HCC) 04/01/2021   Longstanding persistent atrial fibrillation (HCC) 04/16/2020   Secondary hypercoagulable state 04/16/2020   Edema leg 05/17/2019   Severe obesity (BMI 35.0-39.9) with comorbidity (HCC) 04/04/2019   Prediabetes 04/04/2019   Seasonal allergic rhinitis due to pollen 12/14/2018   Chronic back pain 08/23/2018   OSA on CPAP 06/23/2018   Headache 01/29/2017   Refractive errors 04/15/2016    Medical non-compliance 11/06/2015   Hyperlipidemia LDL goal <70 11/06/2015   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   NSVT (nonsustained ventricular tachycardia) (HCC) 11/06/2015   History of CVA (cerebrovascular accident)    Claudication of both lower extremities 03/22/2015   Chronic atrial fibrillation (HCC) 04/12/2014   Pap smear for cervical cancer screening 03/28/2014   Cigarette smoker 06/15/2013   Ventral hernia 06/15/2013   Chronic anticoagulation 02/28/2013   Hx-TIA (transient ischemic attack) 02/05/13 02/15/2013   Essential hypertension 02/15/2013    ONSET DATE: 08/04/2024 (date of referral)  REFERRING DIAG: I69.354 (ICD-10-CM) - Hemiparesis affecting left side as late effect of stroke (HCC)  THERAPY DIAG:  Other abnormalities of gait and mobility  Hemiplegia and hemiparesis following cerebral infarction affecting left non-dominant side (HCC)  Muscle weakness (generalized)  Other lack of coordination  Rationale for Evaluation and Treatment: Rehabilitation  SUBJECTIVE:                                                                                                                                                                                             SUBJECTIVE STATEMENT: No acute changes.  No recent falls.  Not seen since eval (08/25/24) d/t holidays; not receiving any home health therapies.  Thinks they have 100 visits each for PT/OT/SLP (daughter checking into insurance to verify).  Seeing PCP tomorrow and will get OT and SLP referrals. Pt accompanied by: self and family member (daughter Hospital Doctor)  PERTINENT HISTORY:  PMH includes htn, NSVT, a-fib/atrial flutter (s/p cardioversion 02/2016), stroke (cerebral infarction due to embolism of right middle cerebral artery status post IV TPA (in 10/2015),  right MCA stroke in 01/2018 (after running out of Xarelto ), right ACA stroke in 04/2024 (status post thrombectomy with residual left hemiparesis, dysarthria), neuropathic pain, and  DM type 2.  PAIN:  Are you having pain? No  PRECAUTIONS: Fall; L LE AFO  RED FLAGS: None   WEIGHT BEARING RESTRICTIONS: No  FALLS: Has patient fallen in last 6 months? No  LIVING ENVIRONMENT: Lives with: lives with their family (daughter and 55 y.o. granddaughter) Lives in: House/apartment; 1 level house Stairs: Yes: External: 3 steps; none Has following equipment at home: [employer], Wheelchair (  manual), bed side commode, and standard walker; also has [employer]  PLOF: Independent prior to August 2025 stroke.  Has aide 5 days/week for 2.5 hours each day.  Limited walking in home d/t fall concern; uses hemiwalker and 1 assist.  Uses manual w/c mainly or sits in chair.  Pt propels self a little in wheelchair but limited d/t L sided weakness; mostly gets pushed in w/c.   PATIENT GOALS: Being able to walk and be self sufficient on own.  Be able to walk without assistive device.  OBJECTIVE:  Note: Objective measures were completed at Evaluation unless otherwise noted.  DIAGNOSTIC FINDINGS:  MRI Brain 05/06/24: Multifocal acute right MCA territory infarcts including infarcts in the right basal ganglia, right frontal and right parietal lobes. Associated edema without mass effect. No midline shift. Areas of petechial hemorrhage in the right parietal lobe. Small additional acute infarct in the splenium of the corpus callosum. No mass occupying acute hemorrhage. No hydrocephalus. No mass lesion. Remote left cerebellar infarcts. Patchy T2/FLAIR hyperintensities in the white matter, compatible with chronic microvascular ischemic disease.  COGNITION: Overall cognitive status: Within functional limits for tasks assessed   SENSATION: No tingling/numbness reported.  Light touch intact B LE's.  COORDINATION: Intact R LE heel to shin in sitting; unable to assess L LE d/t weakness  EDEMA:  No LE swelling noted  MUSCLE TONE: L UE/LE spasticity/tone noted  POSTURE: rounded shoulders and  forward head  LOWER EXTREMITY MMT:    MMT Right Eval Left Eval  Hip flexion 4+/5 2/5  Hip extension    Hip abduction    Hip adduction    Hip internal rotation    Hip external rotation    Knee flexion 4+/5 2/5  Knee extension 4+/5 4-/5  Ankle dorsiflexion 4+/5 1/5  Ankle plantarflexion At least 3/5 AROM At least 1/5  Ankle inversion    Ankle eversion    (Blank rows = not tested)  TRANSFERS: Sit to stand: Mod A  Assistive device utilized: Hemi walker     Stand to sit: Mod A  Assistive device utilized: Hemi walker      GAIT: Findings: Gait Characteristics: L LE externally rotated with decreased L LE foot clearance and step length; increased R lean to advance L LE; decreased L heel strike, Distance walked: 24 feet, Assistive device utilized:Hemi walker, Level of assistance: Min A, and Comments: decreased gait speed; assist to steady  FUNCTIONAL TESTS:  5 times sit to stand: Not appropriate at this time d/t assist needs 10 meter walk test: Not appropriate at this time d/t assist needs/limited mobility  PATIENT SURVEYS:  TBA  VITALS: Vitals:   09/19/24 1028  BP: 114/74  Pulse: 74                                                                                                                               TREATMENT DATE: 09/19/24  Self Care: BP and HR taken in sitting  at rest beginning of session (see below for details). Vitals:   09/19/24 1028  BP: 114/74  Pulse: 74   Therapeutic Activity: Sit to stand (x1 trial from manual w/c and x1 trial from mat table with hemi-walker use): mod assist Notes: vc's for overall technique and UE/LE positioning for transfers Stand step turn w/c to/from mat table with hemi-walker: CGA to occasional min assist for balance Notes: vc's for positioning and technique   Therapeutic Exercise: Exercises (for HEP): Seated March  x10 reps B LE's (AROM R LE; AAROM L LE) Seated Long Arc Quad  x10 reps B LE's (AROM R LE; AAROM L LE) Seated Knee  Flexion Slide x5 reps L LE (AAROM; pt's foot on washcloth on floor to decreased resistance) Seated Hip Adduction Squeeze with folded pillow between thighs: x10 reps with 3 second hold Seated Hip Abduction x10 reps B LE's (very minimal AROM noted L LE but contraction noted) Notes: vc's and demo for technique   PATIENT EDUCATION: Education details: Issued HEP. Person educated: Patient and Child(ren) (pt's daughter) Education method: Explanation, Demonstration, Tactile cues, Verbal cues, and Handouts Education comprehension: verbalized understanding, returned demonstration, verbal cues required, tactile cues required, and needs further education  HOME EXERCISE PROGRAM: Pt reports she has assist at home for L LE exercises. Access Code: [email or web address] URL: [email or web address] Date: 09/19/2024 Prepared by: Damien Caulk  Exercises - Seated March  - 1 x daily - 3-5 x weekly - 2-3 sets - 10 reps - Seated Long Arc Quad  - 1 x daily - 3-5 x weekly - 1-2 sets - 10 reps - Seated Knee Flexion Slide  - 1 x daily - 3-5 x weekly - 1-2 sets - 5-10 reps - Seated Hip Adduction Squeeze with Ball  - 1 x daily - 3-5 x weekly - 1-2 sets - 10 reps - 3 second hold - Seated Hip Abduction  - 1 x daily - 3-5 x weekly - 1-2 sets - 10 reps  GOALS: Goals reviewed with patient? Yes  SHORT TERM GOALS: Target date: 09/22/2024   Pt will be supervision with initial HEP in order to improve strength and balance in order to decrease fall risk and improve function at home for ADL's. Baseline: To be issued Goal status: INITIAL  2.  Pt will be min assist with transfers with use of hemi-walker to improve ease of transfers for ADL's. Baseline: Mod assist with hemi-walker Goal status: INITIAL  3.  Pt will be able to ambulate 50 feet min assist with hemi-walker for safe mobility within home for ADL's. Baseline: 24 feet min assist with hemi-walker Goal status: INITIAL  LONG TERM GOALS: Target date:  10/20/2024  Pt will be independent with final HEP in order to improve strength and balance in order to decrease fall risk and improve function at home for ADL's. Baseline: To be issued Goal status: INITIAL  2.  Pt will be SBA with transfers with use of LRAD to improve ease and safety of transfers for ADL's. Baseline: Mod assist with hemi-walker Goal status: INITIAL  3.  Pt will be able to ambulate 100 feet SBA with LRAD for safe mobility within home for ADL's. Baseline: 24 feet min assist with hemi-walker Goal status: INITIAL  4.  Pt will decrease 5 Time Sit to Stand by at least 3 seconds in order to demonstrate clinically significant improvement in LE strength. Baseline: TBA (not appropriate on eval) Goal status: INITIAL  5.  Pt will increase by at least 0.13 m/s  in order to demonstrate clinically significant improvement in community ambulation.  Baseline: TBA (not appropriate on eval) Goal status: INITIAL   ASSESSMENT:  CLINICAL IMPRESSION: Patient was seen today for first follow-up physical therapy treatment session to address strength (pt has not returned to therapy since initial evaluation 08/25/24 d/t holidays).  Focused session on LE strengthening exercises and issuing HEP.  Patient continues to be limited by L sided weakness, balance, and functional mobility.  They demonstrate appropriate understanding of HEP and importance of HEP.  They would continue to benefit from skilled PT to address impairments as noted and progress towards long term goals.   OBJECTIVE IMPAIRMENTS: Abnormal gait, cardiopulmonary status limiting activity, decreased activity tolerance, decreased balance, decreased cognition, decreased coordination, decreased endurance, decreased knowledge of condition, decreased knowledge of use of DME, decreased mobility, difficulty walking, decreased ROM, decreased strength, decreased safety awareness, impaired flexibility, impaired tone, impaired UE functional use,  improper body mechanics, and postural dysfunction.   ACTIVITY LIMITATIONS: carrying, lifting, bending, standing, squatting, stairs, transfers, bed mobility, bathing, toileting, dressing, reach over head, hygiene/grooming, and locomotion level  PARTICIPATION LIMITATIONS: meal prep, cleaning, laundry, driving, shopping, and community activity  PERSONAL FACTORS: Age, Fitness, Past/current experiences, and 1-2 comorbidities: h/o prior CVA; CHF; NSVT are also affecting patient's functional outcome.   REHAB POTENTIAL: Good  CLINICAL DECISION MAKING: Evolving/moderate complexity  EVALUATION COMPLEXITY: Moderate  PLAN:  PT FREQUENCY: 2x/week  PT DURATION: 8 weeks  PLANNED INTERVENTIONS: 97164- PT Re-evaluation, 97750- Physical Performance Testing, 97110-Therapeutic exercises, 97530- Therapeutic activity, [id number]- Neuromuscular re-education, 97535- Self Care, [id number]- Manual therapy, [id number]- Gait training, [id number]- Orthotic Initial, [phone]- Orthotic/Prosthetic subsequent, [id number]- Aquatic Therapy, [phone]- Splinting, [phone]- Electrical stimulation (manual), [phone] (1-2 muscles), [id number] (3+ muscles)- Dry Needling, Patient/Family education, Balance training, Stair training, Taping, Joint mobilization, Spinal mobilization, Cognitive remediation, DME instructions, Wheelchair mobility training, Cryotherapy, Moist heat, and Biofeedback  PLAN FOR NEXT SESSION: Check on HEP.  Transfer training.  Gait training.  Balance; strengthening; coordination.  Check 5 time sit to stand and when appropriate.   Damien Caulk, PT 09/19/2024, 4:39 PM        "

## 2024-09-20 ENCOUNTER — Ambulatory Visit: Attending: Family Medicine | Admitting: Family Medicine

## 2024-09-20 VITALS — BP 112/74 | HR 73 | Temp 98.2°F | Wt 180.4 lb

## 2024-09-20 DIAGNOSIS — I69354 Hemiplegia and hemiparesis following cerebral infarction affecting left non-dominant side: Secondary | ICD-10-CM

## 2024-09-20 DIAGNOSIS — I482 Chronic atrial fibrillation, unspecified: Secondary | ICD-10-CM

## 2024-09-20 DIAGNOSIS — I10 Essential (primary) hypertension: Secondary | ICD-10-CM

## 2024-09-20 DIAGNOSIS — E785 Hyperlipidemia, unspecified: Secondary | ICD-10-CM | POA: Diagnosis not present

## 2024-09-20 DIAGNOSIS — E1169 Type 2 diabetes mellitus with other specified complication: Secondary | ICD-10-CM | POA: Diagnosis not present

## 2024-09-20 DIAGNOSIS — K5909 Other constipation: Secondary | ICD-10-CM | POA: Diagnosis not present

## 2024-09-20 DIAGNOSIS — M792 Neuralgia and neuritis, unspecified: Secondary | ICD-10-CM

## 2024-09-20 DIAGNOSIS — I152 Hypertension secondary to endocrine disorders: Secondary | ICD-10-CM

## 2024-09-20 DIAGNOSIS — I4891 Unspecified atrial fibrillation: Secondary | ICD-10-CM | POA: Diagnosis not present

## 2024-09-20 DIAGNOSIS — E1159 Type 2 diabetes mellitus with other circulatory complications: Secondary | ICD-10-CM

## 2024-09-20 LAB — POCT GLYCOSYLATED HEMOGLOBIN (HGB A1C): HbA1c, POC (controlled diabetic range): 6.2 % (ref 0.0–7.0)

## 2024-09-20 NOTE — Progress Notes (Signed)
 "  Subjective:  Patient ID: [name], female    DOB: [birth year]  Age: 59 y.o. MRN: [id number]  CC: Medical Management of Chronic Issues (Referral for OT and SLP)     Discussed the use of AI scribe software for clinical note transcription with the patient, who gave verbal consent to proceed.  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 Accompanied by her daughter [name] to this appointment.  She is starting outpatient rehabilitation and currently attends two sessions per week. She needs referrals for occupational and speech therapy at the same facility and stopped home therapy due to poor benefit.  She notes gradual strength improvement but persistent weakness on the left side after her stroke. She uses a brace on one shoe for mobility but finds it uncomfortable and has not been advised to brace the left foot. She is cautious walking at home on slippery floors and uses a walker.  Her last A1c was 6.2. She takes baclofen  and gabapentin  for neuropathy and muscle spasms, and metoprolol , Xarelto  for atrial fibrillation, and spironolactone  for hypertension.  She has frequent constipation controlled with Miralax . She has occasional urinary accidents without dysuria. She is concerned that soft stools and difficulty with cleaning may contribute to UTIs, though she has not had recent recurrent UTIs and uses wipes for hygiene.    Past Medical History:  Diagnosis Date    Clotting disorder    on xarelto    Essential hypertension    Heart murmur    a. 10/2015 Echo: EF 60-65%, no rwma, mild AI/MR, sev dil LA/RA, PASP .   Noncompliance    NSVT (nonsustained ventricular tachycardia) (HCC)    a. 10/2015 during admission for CVA/AF.   Persistent atrial fibrillation (HCC)    a. CHA2DS2VASC = 4-->Xarelto ;  b. 02/2016 Successful DCCV.  c. Recurrent fib   Pneumonia 2012 x 2; 2015   Stroke [facility])    a. 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   Transient ischemic attack (TIA) 01/2013    Past Surgical History:  Procedure Laterality Date   CARDIOVERSION N/A 02/18/2013   Procedure: CARDIOVERSION;  Surgeon: Jerel Balding, MD;  Location: [facility] ENDOSCOPY;  Service: Cardiovascular;  Laterality: N/A;   CARDIOVERSION N/A 02/23/2013   Procedure: CARDIOVERSION;  Surgeon: Alm LELON Clay, MD;  [facility]: [facility];  Service: Cardiovascular;  Laterality: N/A;  BESIDE CV   CARDIOVERSION N/A 03/12/2016   Procedure: CARDIOVERSION;  Surgeon: Ezra GORMAN Shuck, MD;  Location: [facility] ENDOSCOPY;  Service: Cardiovascular;  Laterality: N/A;   IR PERCUTANEOUS ART THROMBECTOMY/INFUSION INTRACRANIAL INC DIAG ANGIO  05/05/2024   IR US  GUIDE VASC ACCESS RIGHT  05/05/2024   RADIOLOGY WITH ANESTHESIA N/A 05/05/2024   Procedure: RADIOLOGY WITH ANESTHESIA;  Surgeon: Radiologist, Medication, MD;  Location: [facility] OR;  Service: Radiology;  Laterality: N/A;   RIGHT/LEFT HEART CATH AND CORONARY ANGIOGRAPHY N/A 04/02/2021   Procedure: RIGHT/LEFT HEART CATH AND CORONARY ANGIOGRAPHY;  Surgeon: Claudene Victory LELON, MD;  Location:  [facility] INVASIVE CV LAB;  Service: Cardiovascular;  Laterality: N/A;   TEE WITHOUT CARDIOVERSION N/A 02/18/2013   Procedure: TRANSESOPHAGEAL ECHOCARDIOGRAM (TEE);  Surgeon: Jerel Balding, MD;  Location: [facility] ENDOSCOPY;  Service: Cardiovascular;  Laterality: N/A;   TUBAL LIGATION  1992    Family History  Problem Relation Age  of Onset   Cancer Mother    Atrial fibrillation Mother    Breast cancer Mother    Hypertension Father    Atrial fibrillation Father    Peripheral vascular disease Father    Hypertension Other    Diabetes Other    Heart attack Neg Hx    Stroke Neg Hx    Rectal cancer Neg Hx    Colon cancer Neg Hx    Esophageal cancer Neg Hx    Stomach cancer Neg Hx     Social History   Socioeconomic History   Marital status: Single    Spouse name: Not on file   Number of children: 7   Years of education: Not on file   Highest education level: 12th grade  Occupational History   Occupation: Multimedia Programmer: [employer]   Occupation: in home health aide  Tobacco Use   Smoking status: Former    Current packs/day: 0.00    Types: Cigarettes    Start date: 03/15/2010    Quit date: 03/15/2013    Years since quitting: 11.5   Smokeless tobacco: Never  Vaping Use   Vaping status: Never Used  Substance and Sexual Activity   Alcohol use: Yes    Comment: drinks on the weekends,   2-3 beers   Drug use: No   Sexual activity: Not Currently    Birth control/protection: None  Other Topics Concern   Not on file  Social History Narrative   Not on file   Social Drivers of Health   Tobacco Use: Medium Risk (09/19/2024)   Patient History    Smoking Tobacco Use: Former    Smokeless Tobacco Use: Never    Passive Exposure: Not on Actuary Strain: Low Risk (09/20/2024)   Overall Financial Resource Strain (CARDIA)    Difficulty of Paying Living Expenses: Not very hard  Recent Concern: Financial Resource Strain - Medium Risk (08/16/2024)   Overall Financial Resource Strain (CARDIA)    Difficulty of Paying Living Expenses: Somewhat hard  Food Insecurity: Food Insecurity Present (09/20/2024)   Epic    Worried About Programme Researcher, Broadcasting/film/video in the Last Year: Sometimes true    Ran Out of Food in the Last Year: Sometimes true  Transportation Needs: No Transportation Needs (09/20/2024)    Epic    Lack of Transportation (Medical): No    Lack of Transportation (Non-Medical): No  Physical Activity: Inactive (09/20/2024)   Exercise Vital Sign    Days of Exercise per Week: 2 days    Minutes of Exercise per Session: 0 min  Stress: Stress Concern Present (09/20/2024)   [employer] of Occupational Health - Occupational Stress Questionnaire    Feeling of Stress: To some extent  Social Connections: Socially Isolated (09/20/2024)   Social Connection and Isolation Panel    Frequency of Communication with Friends and Family: Three times a week    Frequency of Social Gatherings with Friends and Family: Once a week    Attends Religious Services: Never    Database Administrator or Organizations: No    Attends Banker Meetings: Not on file    Marital  Status: Never married  Depression (PHQ2-9): High Risk (08/17/2024)   Depression (PHQ2-9)    PHQ-2 Score: 11  Alcohol Screen: Low Risk (11/18/2023)   Alcohol Screen    Last Alcohol Screening Score (AUDIT): 4  Housing: Low Risk (09/20/2024)   Epic    Unable to Pay for Housing in the Last Year: No    Number of Times Moved in the Last Year: 0    Homeless in the Last Year: No  Utilities: At Risk (08/30/2024)   Epic    Threatened with loss of utilities: Yes  Health Literacy: Not on file    Allergies[1]  Outpatient Medications Prior to Visit  Medication Sig Dispense Refill   Accu-Chek FastClix Lancets MISC USE AS INSTRUCTED TO CHECK BLOOD SUGAR ONCE DAILY. 102 each 0   acetaminophen  (TYLENOL ) 325 MG tablet Take 1-2 tablets (325-650 mg total) by mouth every 4 (four) hours as needed for mild pain (pain score 1-3).     baclofen  (LIORESAL ) 10 MG tablet Take 1 tablet (10 mg total) by mouth 3 (three) times daily as needed for muscle spasms. 60 each 2   Blood Glucose Monitoring Suppl (ACCU-CHEK GUIDE ME) w/Device KIT 1 kit by Does not apply route daily. 1 kit 0   Continuous Glucose Receiver (FREESTYLE LIBRE 3 READER) DEVI Apply as  directed to test blood glucose 1 each 0   Continuous Glucose Sensor (FREESTYLE LIBRE 3 SENSOR) MISC Place 1 sensor on the skin every 14 days. Use to check glucose continuously 3 each 11   feeding supplement (ENSURE PLUS HIGH PROTEIN) LIQD Take 237 mLs by mouth daily. 7110 mL 0   gabapentin  (NEURONTIN ) 300 MG capsule Take 2 capsules (600 mg total) by mouth at bedtime. 60 capsule 3   glucose blood test strip Use as instructed 100 strip 0   lidocaine  (XYLOCAINE ) 2 % solution Use as directed 15 mLs in the mouth or throat every 6 (six) hours as needed for mouth pain. 120 mL 0   metoprolol  succinate (TOPROL -XL) 100 MG 24 hr tablet Take 1 tablet (100 mg total) by mouth daily. Take with or immediately following a meal. Take with 50mg  to equal 150mg  daily. 90 tablet 3   metoprolol  succinate (TOPROL -XL) 50 MG 24 hr tablet Take 1 tablet (50 mg total) by mouth daily. Take with or immediately following a meal.Take with 100mg  to equal 150mg  daily. 90 tablet 3   Misc. Devices MISC Resting hand splint 1 each 0   Nystatin (GERHARDT'S BUTT CREAM) CREA Apply 1 Application topically 3 (three) times daily. 60 each 0   polyethylene glycol powder (GLYCOLAX /MIRALAX ) 17 GM/SCOOP powder Dissolve 1 capful (17g) in 4-8 ounces of liquid and take by mouth daily. 238 g 0   rivaroxaban  (XARELTO ) 20 MG TABS tablet Take 1 tablet (20 mg total) by mouth daily with supper. 90 tablet 1   rosuvastatin  (CRESTOR ) 40 MG tablet Take 1 tablet (40 mg total) by mouth daily. 90 tablet 1   senna-docusate (SENOKOT-S) 8.6-50 MG tablet Take 1 tablet by mouth at bedtime. 30 tablet 0   spironolactone  (ALDACTONE ) 25 MG tablet Take 0.5 tablets (12.5 mg total) by mouth daily. 45 tablet 1   triamcinolone  cream (KENALOG ) 0.1 % Apply 1 Application topically 2 (two) times daily. 45 g 1   nitrofurantoin , macrocrystal-monohydrate, (MACROBID ) 100 MG capsule Take 1 capsule (100 mg total) by mouth 2 (two) times daily. 10 capsule 0   phenazopyridine  (PYRIDIUM ) 95  MG tablet Take 1 tablet (95 mg total) by  mouth 3 (three) times daily as needed (urinary symptoms). 30 tablet 0   sulfamethoxazole -trimethoprim  (BACTRIM  DS) 800-160 MG tablet Take 1 tablet by mouth 2 (two) times daily. 14 tablet 0   No facility-administered medications prior to visit.     ROS Review of Systems  Constitutional:  Negative for activity change and appetite change.  HENT:  Negative for sinus pressure and sore throat.   Respiratory:  Negative for chest tightness, shortness of breath and wheezing.   Cardiovascular:  Negative for chest pain and palpitations.  Gastrointestinal:  Positive for constipation. Negative for abdominal distention and abdominal pain.  Genitourinary: Negative.   Musculoskeletal:  Positive for gait problem.  Neurological:  Positive for weakness.  Psychiatric/Behavioral:  Negative for behavioral problems and dysphoric mood.     Objective:  BP 112/74   Pulse 73   Temp 98.2 F (36.8 C) (Oral)   Wt 180 lb 6.4 oz (81.8 kg)   SpO2 100%   BMI 31.96 kg/m      09/20/2024    2:33 PM 09/19/2024   10:28 AM 08/25/2024    4:04 PM  BP/Weight  Systolic BP 112 114 120  Diastolic BP 74 74 58  Wt. (Lbs) 180.4    BMI 31.96 kg/m2        Physical Exam Constitutional:      Appearance: She is well-developed.  Cardiovascular:     Rate and Rhythm: Normal rate.     Heart sounds: Normal heart sounds. No murmur heard. Pulmonary:     Effort: Pulmonary effort is normal.     Breath sounds: Normal breath sounds. No wheezing or rales.  Chest:     Chest wall: No tenderness.  Abdominal:     General: Bowel sounds are normal. There is no distension.     Palpations: Abdomen is soft. There is no mass.     Tenderness: There is no abdominal tenderness.  Musculoskeletal:     Right lower leg: No edema.     Left lower leg: No edema.  Neurological:     Mental Status: She is alert and oriented to person, place, and time.     Comments: Left-sided weakness  Psychiatric:         Mood and Affect: Mood normal.        Latest Ref Rng & Units 06/15/2024   11:07 AM 06/06/2024    5:04 AM 06/03/2024    5:10 AM  CMP  Glucose 70 - 99 mg/dL 95  93    BUN 8 - 27 mg/dL 8  9    Creatinine 9.42 - 1.00 mg/dL 9.04  8.76    Sodium 865 - 144 mmol/L 142  141    Potassium 3.5 - 5.2 mmol/L 4.3  3.8  3.6   Chloride 96 - 106 mmol/L 105  109    CO2 20 - 29 mmol/L 19  21    Calcium  8.7 - 10.3 mg/dL 89.8  9.3      Lipid Panel     Component Value Date/Time   CHOL 152 05/06/2024 0559   CHOL 201 (H) 04/13/2024 1621   TRIG 39 05/06/2024 0559   HDL 35 (L) 05/06/2024 0559   HDL 49 04/13/2024 1621   CHOLHDL 4.3 05/06/2024 0559   VLDL 8 05/06/2024 0559   LDLCALC 109 (H) 05/06/2024 0559   LDLCALC 140 (H) 04/13/2024 1621    CBC    Component Value Date/Time   WBC 8.1 06/06/2024 0504   RBC 5.08 06/06/2024 0504  HGB 13.4 06/06/2024 0504   HCT 42.6 06/06/2024 0504   PLT 151 06/06/2024 0504   MCV 83.9 06/06/2024 0504   MCH 26.4 06/06/2024 0504   MCHC 31.5 06/06/2024 0504   RDW 13.5 06/06/2024 0504   LYMPHSABS 1.8 05/10/2024 0447   MONOABS 0.9 05/10/2024 0447   EOSABS 0.1 05/10/2024 0447   BASOSABS 0.0 05/10/2024 0447    Lab Results  Component Value Date   HGBA1C 6.2 09/20/2024    Lab Results  Component Value Date   HGBA1C 6.2 09/20/2024   HGBA1C 6.1 (H) 05/05/2024   HGBA1C 6.1 04/13/2024    Is patient on antiplatelet or anticoagulation (if cardioembolic)? Yes  Is patient compliant with medication?   Is patient on a Statin and LDL <70? No  Is HbA1c < 7? Yes  Is BP < 130/80 mmHg? Yes  If Smoker, has information of Virtual smoking cessation program been provided? Not applicable  Is patient involved or interested in joining a support group? 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 - Continue outpatient rehabilitation twice a week. - Referred to  occupational therapy and speech therapy. - Consider additional therapy sessions if needed. - Secondary prevention including maintaining LDL goal less than 70.  Will send of lipid panel. Maintaining glycemic control and blood pressure control is imperative  Type 2 diabetes mellitus with other specified complication Type 2 diabetes well-controlled with A1c of 6.2. - Continue current diabetes management plan with diet control  Hypertension associated with type 2 diabetes mellitus Blood pressure well-controlled at 112/74 mmHg. - Continue current antihypertensive regimen. -Counseled on blood pressure goal of less than 130/80, low-sodium, DASH diet, medication compliance, 150 minutes of moderate intensity exercise per week. Discussed medication compliance, adverse effects.   Atrial fibrillation Currently in sinus rhythm Managed with rivaroxaban . - Continue rivaroxaban .  Chronic neuropathic pain Symptoms controlled with gabapentin  and baclofen . - Continue gabapentin  and baclofen .  Hyperlipidemia associated with type 2 diabetes mellitus Managed with rosuvastatin . Previous cholesterol level above target for stroke prevention. - Ordered fasting cholesterol test. - Continue rosuvastatin  40 mg daily.  Constipation Managed with polyethylene glycol powder. Concerns about hygiene and potential UTIs. - Continue polyethylene glycol powder. - Advised on hygiene to prevent UTIs.      No orders of the defined types were placed in this encounter.   Follow-up: No follow-ups on file.       Corrina Sabin, MD, FAAFP. [facility] [place], [place] [phone]   09/20/2024, 6:38 PM     [1]  Allergies Allergen Reactions   Penicillins Hives and Other (See Comments)    Unknown  Has patient had a PCN reaction causing immediate rash, facial/tongue/throat swelling, SOB or lightheadedness with hypotension: No Has patient had a PCN reaction causing severe  rash involving mucus membranes or skin necrosis: No Has patient had a PCN reaction that required hospitalization No Has patient had a PCN reaction occurring within the last 10 years: No If all of the above answers are NO, then may proceed with Cephalosporin use.   "

## 2024-09-20 NOTE — Patient Instructions (Signed)
 VISIT SUMMARY:  Today, we discussed your ongoing recovery and management of several health conditions following your stroke. You are making gradual progress with rehabilitation, and we have made some adjustments to your therapy and medication plans to support your continued improvement.  YOUR PLAN:  -LEFT HEMIPARESIS AS LATE EFFECT OF STROKE: Left hemiparesis means weakness on the left side of your body due to the stroke. You will continue with outpatient rehabilitation twice a week, and we have referred you to occupational and speech therapy. We may consider additional therapy sessions if needed.  -TYPE 2 DIABETES MELLITUS WITH OTHER SPECIFIED COMPLICATION: Your type 2 diabetes is well-controlled with a recent A1c of 6.2. You should continue with your current diabetes management plan.  -HYPERTENSION: Your blood pressure is well-controlled at 112/74 mmHg. You should continue with your current antihypertensive medications.  -ATRIAL FIBRILLATION: Atrial fibrillation is an irregular heartbeat that increases the risk of stroke. You should continue taking rivaroxaban  as prescribed.  -CHRONIC NEUROPATHIC PAIN: Chronic neuropathic pain is ongoing pain caused by nerve damage. Your symptoms are controlled with gabapentin  and baclofen , and you should continue taking these medications.  -HYPERLIPIDEMIA: Hyperlipidemia means having high levels of fats in your blood, which can increase the risk of stroke. We have ordered a fasting cholesterol test and you should continue taking rosuvastatin  40 mg daily.  -CONSTIPATION: Constipation is difficulty in passing stools. You should continue using polyethylene glycol powder and follow the hygiene advice to prevent potential urinary tract infections.  INSTRUCTIONS:  Please continue with your current medications and therapy sessions as discussed. We have referred you to occupational and speech therapy. Additionally, please complete the fasting cholesterol test as  ordered. Follow up with us  if you experience any new symptoms or have concerns about your treatment plan.

## 2024-09-21 ENCOUNTER — Telehealth: Payer: Self-pay | Admitting: Family Medicine

## 2024-09-21 ENCOUNTER — Other Ambulatory Visit: Payer: Self-pay | Admitting: Licensed Clinical Social Worker

## 2024-09-21 ENCOUNTER — Ambulatory Visit: Payer: Self-pay | Attending: Family Medicine

## 2024-09-21 DIAGNOSIS — I639 Cerebral infarction, unspecified: Secondary | ICD-10-CM

## 2024-09-21 DIAGNOSIS — I69354 Hemiplegia and hemiparesis following cerebral infarction affecting left non-dominant side: Secondary | ICD-10-CM

## 2024-09-21 NOTE — Patient Instructions (Signed)
 Visit Information  Thank you for taking time to visit with me today. Please don't hesitate to contact me if I can be of assistance to you before our next scheduled appointment.  Our next appointment is by telephone on 10/26/2024 at 10:00 AM   Please call the care guide team at [phone] if you need to cancel or reschedule your appointment.   Following is a copy of your care plan:   Goals Addressed             This Visit's Progress    VBCI Social Work Care Plan   On track    Problems:   Transportation             History of CVA             Receiving in home PT, OT, ST             Mobility issues (weakness on left side)              CSW Clinical Goal(s):   Over the next 30  days the Patient will attend all scheduled medical appointments as evidenced by patient report and care team review of appointment completion in electronic medical record.           In next 30 days,  [name] to participate in scheduled PT sessions (Outpatient) to increase mobility and improve walking AEB client report of improved walking. (She wants to be self sufficient with mobility and other daily activities. Currently receives PT on outpatient basis one time per week)  Interventions:  Client reported she wants to improve mobility and self sufficiency             Client wants to work with PCP, Dr. Newlin, to regulate client BP as needed.              Client said she enjoys support of Dr. Newlin and finds support of PCP very beneficial.             Discussed medication procurement             Completed assessments as needed. Completed PHQ 2/9. Completed GAD-7             Provided counseling support             Discussed financial needs of client.Discussed food provision issues. She said she has adequate food supply.              She has great support from her adult children. She has 4 daughters and 3 sons and said she has great family support               Discussed transport needs. She has been applying  for [employer] transport assistance. She said she is now approved for [employer] transport help             Discussed support with PCP.              Reviewed pain issues faced               Dicussed program support with RN, LCSW, Pharmacist            Discussed sleep issues. She spoke of decreased sleep            She said she has a portable ramp she uses at home as needed to go in and out of home. She spoke of using wheelchair as needed  Client said she would like a list of local housing resources. LCSW to send order today to Care Guide requesting Care Guide to please mail [name] a list of local housing resources.              Encouraged client to call LCSW as needed for SW support            Client was very appreciative of call today from LCSW               Patient Goals/Self-Care Activities:  Client to attend all scheduled medical appointments             Client to take medications as prescribed            Client to call LCSW as needed for SW support            Client to participate in all scheduled therapy sessions with PT.             Client to communicate her needs with her 7 children to seek support as needed from adult children             Client to communicate as needed with RN Nichole Jobs regarding client nursing needs.   Plan:   Telephone follow up appointment with care management team member scheduled for:  10/26/2024 at 10:00 AM        Please go to [facility] [address] [place] [phone]) if you are experiencing a Mental Health or Behavioral Health Crisis or need someone to talk to.  Patient verbalized understanding of Care plan and visit instructions communicated this visit   [name]  MSW, LCSW [place] Licensed Clinical Social Worker Direct Dial :  [phone] Fax:  [phone] Website:  [email or web address]

## 2024-09-21 NOTE — Patient Outreach (Signed)
 Complex Care Management   Visit Note  09/21/2024  Name:  [name] MRN: [id number] DOB: [birth year]  Situation: Referral received for Complex Care Management related to anxiety issues; stress issues; housing needs I obtained verbal consent from Patient.  Visit completed with Patient  on the phone  Background:   Past Medical History:  Diagnosis Date   Clotting disorder    on xarelto    Essential hypertension    Heart murmur    a. 10/2015 Echo: EF 60-65%, no rwma, mild AI/MR, sev dil LA/RA, PASP .   Noncompliance    NSVT (nonsustained ventricular tachycardia) (HCC)    a. 10/2015 during admission for CVA/AF.   Persistent atrial fibrillation (HCC)    a. CHA2DS2VASC = 4-->Xarelto ;  b. 02/2016 Successful DCCV.  c. Recurrent fib   Pneumonia 2012 x 2; 2015   Stroke [facility])    a. 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   Transient ischemic attack (TIA) 01/2013    Assessment: Patient Reported Symptoms:  Cognitive Cognitive Status: Alert and oriented to person, place, and time Cognitive/Intellectual Conditions Management [RPT]: None reported or documented in medical history or problem list   Health Maintenance Behaviors: Stress management Health Facilitated by: Stress management  Neurological Neurological Review of Symptoms: Weakness, Vision changes (blurry vision Is scheduled for eye exam) Neurological Management Strategies: Adequate rest, Coping strategies  HEENT HEENT Symptoms Reported: Sudden change or loss of vision HEENT Management Strategies: Coping strategies    Cardiovascular Cardiovascular Symptoms Reported: No symptoms reported Cardiovascular Management Strategies: Coping strategies  Respiratory Respiratory Symptoms Reported: No symptoms reported Respiratory Management Strategies: Coping strategies  Endocrine Endocrine Symptoms Reported: Weakness or fatigue, Shakiness, Blurry vision,  Headaches    Gastrointestinal Gastrointestinal Symptoms Reported: Incontinence, Constipation Additional Gastrointestinal Details: Constipation Gastrointestinal Management Strategies: Adequate rest    Genitourinary Genitourinary Symptoms Reported: Incontinence Genitourinary Management Strategies: Adequate rest  Integumentary Integumentary Symptoms Reported: No symptoms reported Skin Management Strategies: Coping strategies  Musculoskeletal Musculoskelatal Symptoms Reviewed: Muscle pain, Back pain, Unsteady gait, Difficulty walking, Weakness Musculoskeletal Management Strategies: Coping strategies      Psychosocial Psychosocial Symptoms Reported: Sadness - if selected complete PHQ 2-9, Anxiety - if selected complete GAD, Depression - if selected complete PHQ 2-9 Additional Psychological Details: Hx of CVA Behavioral Management Strategies: Coping strategies Techniques to Cope with Loss/Stress/Change: Counseling, Diversional activities Quality of Family Relationships: supportive Do you feel physically threatened by others?: No Fall Risk. Has portable ramp at home.  Looking for another housing location in area    09/21/2024    PHQ2-9 Depression Screening   Little interest or pleasure in doing things Several days  Feeling down, depressed, or hopeless Several days  PHQ-2 - Total Score 2  Trouble falling or staying asleep, or sleeping too much Several days  Feeling tired or having little energy Several days  Poor appetite or overeating  Several days  Feeling bad about yourself - or that you are a failure or have let yourself or your family down Several days  Trouble concentrating on things, such as reading the newspaper or watching television Several days  Moving or speaking so slowly that other people could have noticed.  Or the opposite - being so fidgety or restless that you have been moving around a lot more than usual Several days  Thoughts that you would be better off dead, or hurting  yourself in some way Not at all  PHQ2-9 Total Score 8  If you checked off any problems, how difficult have these problems made it for you to do your work, take care of things at home, or get along with other people Somewhat difficult  Depression Interventions/Treatment Counseling    Today's Vitals  Client reported that her BP has been within normal range  Pain Scale: 0-10 Pain Score: 3  Pain Type: Chronic pain Pain Location: Knee Pain Orientation: Left Pain Descriptors / Indicators: Aching Patients Stated Pain Goal: 1 Pain Intervention(s): Relaxation  Medications Reviewed Today   Medications were not reviewed in this encounter     Recommendation:   PCP Follow-up Continue Current Plan of Care Participate in scheduled weekly Outpatient Physical Therapy sessions Call LCSW as needed for SW support Take medications as prescribed Call RN as needed for nursing support  Follow Up Plan:   Telephone follow up appointment date/time:  10/26/2024 at 10:00 AM   Glendia Pear  MSW, LCSW [place] Licensed Clinical Social Worker Direct Dial :  [phone] Fax:  [phone] Website:  [email or web address]

## 2024-09-21 NOTE — Telephone Encounter (Signed)
 The patient came into the office today requesting a refill for spironolactone  (ALDACTONE ) 25 MG tablet and stool softener.

## 2024-09-22 ENCOUNTER — Other Ambulatory Visit: Payer: Self-pay

## 2024-09-22 ENCOUNTER — Telehealth: Payer: Self-pay

## 2024-09-22 ENCOUNTER — Encounter: Payer: Self-pay | Admitting: Physical Therapy

## 2024-09-22 ENCOUNTER — Ambulatory Visit: Admitting: Physical Therapy

## 2024-09-22 ENCOUNTER — Ambulatory Visit: Payer: Self-pay | Admitting: Family Medicine

## 2024-09-22 VITALS — BP 121/72 | HR 60

## 2024-09-22 DIAGNOSIS — R2689 Other abnormalities of gait and mobility: Secondary | ICD-10-CM | POA: Diagnosis not present

## 2024-09-22 DIAGNOSIS — R278 Other lack of coordination: Secondary | ICD-10-CM

## 2024-09-22 DIAGNOSIS — M6281 Muscle weakness (generalized): Secondary | ICD-10-CM

## 2024-09-22 DIAGNOSIS — I69354 Hemiplegia and hemiparesis following cerebral infarction affecting left non-dominant side: Secondary | ICD-10-CM

## 2024-09-22 LAB — LP+NON-HDL CHOLESTEROL
Cholesterol, Total: 98 mg/dL — ABNORMAL LOW (ref 100–199)
HDL: 40 mg/dL
LDL Chol Calc (NIH): 46 mg/dL (ref 0–99)
Total Non-HDL-Chol (LDL+VLDL): 58 mg/dL (ref 0–129)
Triglycerides: 49 mg/dL (ref 0–149)
VLDL Cholesterol Cal: 12 mg/dL (ref 5–40)

## 2024-09-22 MED ORDER — SENNOSIDES-DOCUSATE SODIUM 8.6-50 MG PO TABS
1.0000 | ORAL_TABLET | Freq: Every day | ORAL | 2 refills | Status: AC
  Filled 2024-09-22: qty 30, 30d supply, fill #0

## 2024-09-22 NOTE — Telephone Encounter (Signed)
 Refill has been sent.

## 2024-09-22 NOTE — Therapy (Signed)
 " OUTPATIENT PHYSICAL THERAPY NEURO TREATMENT   Patient Name: [name] MRN: [id number] DOB:1965/10/18, 59 y.o., female Today's Date: 09/22/2024   PCP: Delbert Clam, MD REFERRING PROVIDER: Delbert Clam, MD  END OF SESSION:  PT End of Session - 09/22/24 1019     Visit Number 3    Number of Visits 17   16 visits plus Eval   Date for Recertification  11/04/24   for scheduling delays   Authorization Type [facility] Medicaid    Authorization - Visit Number 3    Authorization - Number of Visits 27    PT Start Time 1018    PT Stop Time 1103    PT Time Calculation (min) 45 min    Equipment Utilized During Treatment Gait belt    Activity Tolerance Patient tolerated treatment well    Behavior During Therapy WFL for tasks assessed/performed          Past Medical History:  Diagnosis Date   Clotting disorder    on xarelto    Essential hypertension    Heart murmur    a. 10/2015 Echo: EF 60-65%, no rwma, mild AI/MR, sev dil LA/RA, PASP .   Noncompliance    NSVT (nonsustained ventricular tachycardia) (HCC)    a. 10/2015 during admission for CVA/AF.   Persistent atrial fibrillation (HCC)    a. CHA2DS2VASC = 4-->Xarelto ;  b. 02/2016 Successful DCCV.  c. Recurrent fib   Pneumonia 2012 x 2; 2015   Stroke [facility])    a. 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   Transient ischemic attack (TIA) 01/2013   Past Surgical History:  Procedure Laterality Date   CARDIOVERSION N/A 02/18/2013   Procedure: CARDIOVERSION;  Surgeon: Jerel Balding, MD;  Location: [facility] ENDOSCOPY;  Service: Cardiovascular;  Laterality: N/A;   CARDIOVERSION N/A 02/23/2013   Procedure: CARDIOVERSION;  Surgeon: Alm LELON Clay, MD;  Location: [facility] OR;  Service: Cardiovascular;  Laterality: N/A;  BESIDE CV   CARDIOVERSION N/A 03/12/2016   Procedure: CARDIOVERSION;  Surgeon: Ezra GORMAN Shuck, MD;  Location: [facility] ENDOSCOPY;  Service: Cardiovascular;   Laterality: N/A;   IR PERCUTANEOUS ART THROMBECTOMY/INFUSION INTRACRANIAL INC DIAG ANGIO  05/05/2024   IR US  GUIDE VASC ACCESS RIGHT  05/05/2024   RADIOLOGY WITH ANESTHESIA N/A 05/05/2024   Procedure: RADIOLOGY WITH ANESTHESIA;  Surgeon: Radiologist, Medication, MD;  Location: [facility] OR;  Service: Radiology;  Laterality: N/A;   RIGHT/LEFT HEART CATH AND CORONARY ANGIOGRAPHY N/A 04/02/2021   Procedure: RIGHT/LEFT HEART CATH AND CORONARY ANGIOGRAPHY;  Surgeon: Claudene Victory LELON, MD;  Location: [facility] INVASIVE CV LAB;  Service: Cardiovascular;  Laterality: N/A;   TEE WITHOUT CARDIOVERSION N/A 02/18/2013   Procedure: TRANSESOPHAGEAL ECHOCARDIOGRAM (TEE);  Surgeon: Jerel Balding, MD;  Location: [facility] ENDOSCOPY;  Service: Cardiovascular;  Laterality: N/A;   TUBAL LIGATION  1992   Patient Active Problem List   Diagnosis Date Noted   Permanent atrial fibrillation (HCC) 07/10/2024   Coping style affecting medical condition 05/13/2024   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 2 without complication (HCC) 05/11/2024   Dysphagia due to recent stroke 05/11/2024   Acute right ACA stroke (HCC) 05/09/2024   Acute ischemic right MCA stroke (HCC) 05/05/2024   Stroke (cerebrum) (HCC) 05/05/2024   Type 2 diabetes mellitus without complication, without long-term current use of insulin  (HCC) 06/17/2021   Coronary artery disease involving native coronary artery of native heart without angina pectoris 04/10/2021   Chronic combined systolic  and diastolic CHF (congestive heart failure) (HCC) 04/01/2021   Longstanding persistent atrial fibrillation (HCC) 04/16/2020   Secondary hypercoagulable state 04/16/2020   Edema leg 05/17/2019   Severe obesity (BMI 35.0-39.9) with comorbidity (HCC) 04/04/2019   Prediabetes 04/04/2019   Seasonal allergic rhinitis due to pollen 12/14/2018   Chronic back pain 08/23/2018   OSA on CPAP 06/23/2018   Headache 01/29/2017   Refractive errors 04/15/2016   Medical  non-compliance 11/06/2015   Hyperlipidemia LDL goal <70 11/06/2015   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   NSVT (nonsustained ventricular tachycardia) (HCC) 11/06/2015   History of CVA (cerebrovascular accident)    Claudication of both lower extremities 03/22/2015   Chronic atrial fibrillation (HCC) 04/12/2014   Pap smear for cervical cancer screening 03/28/2014   Cigarette smoker 06/15/2013   Ventral hernia 06/15/2013   Chronic anticoagulation 02/28/2013   Hx-TIA (transient ischemic attack) 02/05/13 02/15/2013   Essential hypertension 02/15/2013    ONSET DATE: 08/04/2024 (date of referral)  REFERRING DIAG: I69.354 (ICD-10-CM) - Hemiparesis affecting left side as late effect of stroke (HCC)  THERAPY DIAG:  Other abnormalities of gait and mobility  Hemiplegia and hemiparesis following cerebral infarction affecting left non-dominant side (HCC)  Muscle weakness (generalized)  Other lack of coordination  Rationale for Evaluation and Treatment: Rehabilitation  SUBJECTIVE:                                                                                                                                                                                             SUBJECTIVE STATEMENT: No recent falls.  Received OT and SLP referrals from PCP.  No acute changes.  Is working on filling out disability paperwork.  No concerns reported from PCP visit.  Hasn't been able to do HEP yet d/t being busy with MD visit and paperwork. Pt accompanied by: self and family member (daughter Hospital Doctor)  PERTINENT HISTORY:  PMH includes htn, NSVT, a-fib/atrial flutter (s/p cardioversion 02/2016), stroke (cerebral infarction due to embolism of right middle cerebral artery status post IV TPA (in 10/2015),  right MCA stroke in 01/2018 (after running out of Xarelto ), right ACA stroke in 04/2024 (status post thrombectomy with residual left hemiparesis, dysarthria), neuropathic pain, and DM type 2.  PAIN:  Are you having  pain? No  PRECAUTIONS: Fall; L LE AFO  RED FLAGS: None   WEIGHT BEARING RESTRICTIONS: No  FALLS: Has patient fallen in last 6 months? No  LIVING ENVIRONMENT: Lives with: lives with their family (daughter and 25 y.o. granddaughter) Lives in: House/apartment; 1 level house Stairs: Yes: External: 3 steps; none Has following equipment at home: Hemi walker, Wheelchair (manual), bed side commode,  and standard walker; also has Stedy  PLOF: Independent prior to August 2025 stroke.  Has aide 5 days/week for 2.5 hours each day.  Limited walking in home d/t fall concern; uses hemiwalker and 1 assist.  Uses manual w/c mainly or sits in chair.  Pt propels self a little in wheelchair but limited d/t L sided weakness; mostly gets pushed in w/c.   PATIENT GOALS: Being able to walk and be self sufficient on own.  Be able to walk without assistive device.  OBJECTIVE:  Note: Objective measures were completed at Evaluation unless otherwise noted.  DIAGNOSTIC FINDINGS:  MRI Brain 05/06/24: Multifocal acute right MCA territory infarcts including infarcts in the right basal ganglia, right frontal and right parietal lobes. Associated edema without mass effect. No midline shift. Areas of petechial hemorrhage in the right parietal lobe. Small additional acute infarct in the splenium of the corpus callosum. No mass occupying acute hemorrhage. No hydrocephalus. No mass lesion. Remote left cerebellar infarcts. Patchy T2/FLAIR hyperintensities in the white matter, compatible with chronic microvascular ischemic disease.  COGNITION: Overall cognitive status: Within functional limits for tasks assessed   SENSATION: No tingling/numbness reported.  Light touch intact B LE's.  COORDINATION: Intact R LE heel to shin in sitting; unable to assess L LE d/t weakness  EDEMA:  No LE swelling noted  MUSCLE TONE: L UE/LE spasticity/tone noted  POSTURE: rounded shoulders and forward head  LOWER EXTREMITY MMT:     MMT Right Eval Left Eval  Hip flexion 4+/5 2/5  Hip extension    Hip abduction    Hip adduction    Hip internal rotation    Hip external rotation    Knee flexion 4+/5 2/5  Knee extension 4+/5 4-/5  Ankle dorsiflexion 4+/5 1/5  Ankle plantarflexion At least 3/5 AROM At least 1/5  Ankle inversion    Ankle eversion    (Blank rows = not tested)  TRANSFERS: Sit to stand: Mod A  Assistive device utilized: Hemi walker     Stand to sit: Mod A  Assistive device utilized: Hemi walker      GAIT: Findings: Gait Characteristics: L LE externally rotated with decreased L LE foot clearance and step length; increased R lean to advance L LE; decreased L heel strike, Distance walked: 24 feet, Assistive device utilized:Hemi walker, Level of assistance: Min A, and Comments: decreased gait speed; assist to steady  FUNCTIONAL TESTS:  5 times sit to stand: Not appropriate at this time d/t assist needs 10 meter walk test: Not appropriate at this time d/t assist needs/limited mobility  PATIENT SURVEYS:  TBA  VITALS: Vitals:   09/22/24 1023  BP: 121/72  Pulse: 60                                                                                                                                TREATMENT DATE: 09/22/24  Self Care: BP and HR taken in sitting at rest beginning  of session (see below for details). Vitals:   09/22/24 1023  BP: 121/72  Pulse: 60   Gait: x11 feet with hemi-walker R UE (pt catching L forefoot of shoe on ground when advancing limiting distance ambulating) x67 feet with hemiwalker R UE and L toe cap (shoe cover) Notes: improved ability to advance L LE noted with shoe cover simulating toe cap; small step to gait pattern; CGA; limited distance ambulating d/t L LE appearing to fatigue (L knee mildly flexing during L LE stance phase); L LE ER noted  Therapeutic Activity: Transfer training: x3 trials from personal manual w/c Notes: mod assist 1st trial and min assist 2nd  and 3rd trial; vc's for positioning and overall technique; assist for L LE positioning (so L foot flat on floor instead of WB'ing on outside of L foot)  PATIENT EDUCATION: Education details: Schedule OT and SLP evaluation.  Encouraged to do HEP (as much as able).  Bring L AFO and shoes with toe cap cover next session to utilize for gait training. Person educated: Patient and Child(ren) (pt's daughter) Education method: Explanation, Demonstration, Tactile cues, Verbal cues, and Handouts Education comprehension: verbalized understanding, returned demonstration, verbal cues required, tactile cues required, and needs further education  HOME EXERCISE PROGRAM: Pt reports she has assist at home for L LE exercises. Access Code: [email or web address] URL: [email or web address] Date: 09/19/2024 Prepared by: Damien Caulk  Exercises - Seated March  - 1 x daily - 3-5 x weekly - 2-3 sets - 10 reps - Seated Long Arc Quad  - 1 x daily - 3-5 x weekly - 1-2 sets - 10 reps - Seated Knee Flexion Slide  - 1 x daily - 3-5 x weekly - 1-2 sets - 5-10 reps - Seated Hip Adduction Squeeze with Ball  - 1 x daily - 3-5 x weekly - 1-2 sets - 10 reps - 3 second hold - Seated Hip Abduction  - 1 x daily - 3-5 x weekly - 1-2 sets - 10 reps  GOALS: Goals reviewed with patient? Yes  SHORT TERM GOALS: Target date: 09/22/2024   Pt will be supervision with initial HEP in order to improve strength and balance in order to decrease fall risk and improve function at home for ADL's. Baseline: Issued 09/19/24 Goal status: NOT MET (1st therapy visit since eval 09/19/24 and pt hasn't been able to do HEP yet that was issued on 09/19/24)  2.  Pt will be min assist with transfers with use of hemi-walker to improve ease of transfers for ADL's. Baseline: Mod assist with hemi-walker on Eval; mod assist 1st trial and min assist 2nd and 3rd trial with mod cueing on 09/22/24 Goal status: PROGRESSING   3.  Pt will be able to ambulate 50 feet  min assist with hemi-walker for safe mobility within home for ADL's. Baseline: 24 feet min assist with hemi-walker on Eval; 67 feet CGA with hemi-walker 09/22/24 Goal status: MET  LONG TERM GOALS: Target date: 10/20/2024  Pt will be independent with final HEP in order to improve strength and balance in order to decrease fall risk and improve function at home for ADL's. Baseline: To be issued Goal status: INITIAL  2.  Pt will be SBA with transfers with use of LRAD to improve ease and safety of transfers for ADL's. Baseline: Mod assist with hemi-walker Goal status: INITIAL  3.  Pt will be able to ambulate 100 feet SBA with LRAD for safe mobility within home for ADL's. Baseline: 24 feet min assist with  hemi-walker Goal status: INITIAL  4.  Pt will decrease 5 Time Sit to Stand by at least 3 seconds in order to demonstrate clinically significant improvement in LE strength. Baseline: TBA (not appropriate on eval) Goal status: INITIAL  5.  Pt will increase by at least 0.13 m/s in order to demonstrate clinically significant improvement in community ambulation.  Baseline: TBA (not appropriate on eval) Goal status: INITIAL   ASSESSMENT:  CLINICAL IMPRESSION: Patient was seen today for physical therapy treatment to address STG's, transfers, and gait.  Focused session on assessing STG's; improving transfer technique; and progressing ambulation with hemi-walker (pt did not bring L LE AFO and wore shoes with good grip; L shoe cover acting as toe cap improved pt's ability to advance L LE with less effort).  Pt met 1/3 STG's, progressing with 1/3 STG's, and did not meet 1/3 STG's; anticipate limited progress d/t this is pt's 2nd follow up visit since evaluation (no PT was scheduled over holidays).  Patient continues to be limited by L sided weakness, balance, and functional mobility.  They demonstrate improvement in ambulation distance using L shoe cover acting as toe cap (to allow sliding of L foot  forward).  They would continue to benefit from skilled PT to address impairments as noted and progress towards long term goals.  OBJECTIVE IMPAIRMENTS: Abnormal gait, cardiopulmonary status limiting activity, decreased activity tolerance, decreased balance, decreased cognition, decreased coordination, decreased endurance, decreased knowledge of condition, decreased knowledge of use of DME, decreased mobility, difficulty walking, decreased ROM, decreased strength, decreased safety awareness, impaired flexibility, impaired tone, impaired UE functional use, improper body mechanics, and postural dysfunction.   ACTIVITY LIMITATIONS: carrying, lifting, bending, standing, squatting, stairs, transfers, bed mobility, bathing, toileting, dressing, reach over head, hygiene/grooming, and locomotion level  PARTICIPATION LIMITATIONS: meal prep, cleaning, laundry, driving, shopping, and community activity  PERSONAL FACTORS: Age, Fitness, Past/current experiences, and 1-2 comorbidities: h/o prior CVA; CHF; NSVT are also affecting patient's functional outcome.   REHAB POTENTIAL: Good  CLINICAL DECISION MAKING: Evolving/moderate complexity  EVALUATION COMPLEXITY: Moderate  PLAN:  PT FREQUENCY: 2x/week  PT DURATION: 8 weeks  PLANNED INTERVENTIONS: 97164- PT Re-evaluation, 97750- Physical Performance Testing, 97110-Therapeutic exercises, 97530- Therapeutic activity, [id number]- Neuromuscular re-education, 97535- Self Care, [id number]- Manual therapy, [id number]- Gait training, [phone]- Orthotic Initial, [phone]- Orthotic/Prosthetic subsequent, [phone]- Aquatic Therapy, [phone]- Splinting, [phone]- Electrical stimulation (manual), [phone] (1-2 muscles), 20561 (3+ muscles)- Dry Needling, Patient/Family education, Balance training, Stair training, Taping, Joint mobilization, Spinal mobilization, Cognitive remediation, DME instructions, Wheelchair mobility training, Cryotherapy, Moist heat, and Biofeedback  PLAN FOR NEXT SESSION: Assess  .  Check on HEP.  Transfer training.  Gait training with hemi-walker; pt to bring L LE AFO and shoes with toe cap cover to utilize during session.  Balance; strengthening; coordination.  Assess 5 time sit to stand when appropriate.   Damien Caulk, PT 09/22/2024, 11:25 AM        "

## 2024-09-22 NOTE — Progress Notes (Signed)
" ° °  Telephone encounter was:  Unsuccessful.  09/22/2024 Name: DOROTHY POLHEMUS MRN: [id number] DOB: 1966/06/22  Unsuccessful outbound call made today to assist with:  Housing   Outreach Attempt:  1st Attempt  No answer and unable to leave a message    Jon Colt [facility]  [facility] Guide, Phone: [phone] Fax: [phone] Website: [email or web address]    "

## 2024-09-22 NOTE — Telephone Encounter (Signed)
 Patient has been informed that she has refills on Spirolactone medication. Patient requesting refill of stool softener.

## 2024-09-22 NOTE — Addendum Note (Signed)
 Addended by: Vieva Brummitt on: 09/22/2024 03:56 PM   Modules accepted: Orders

## 2024-09-23 ENCOUNTER — Other Ambulatory Visit: Payer: Self-pay

## 2024-09-23 NOTE — Patient Instructions (Signed)
 [name] - I am sorry I was unable to reach you today for our scheduled appointment. I work with Newlin, Enobong, MD and am calling to support your healthcare needs. Please contact me at [phone] at your earliest convenience. I look forward to speaking with you soon.   Thank you,   [name], BSW [place] - [facility] Social Worker [phone]

## 2024-09-26 ENCOUNTER — Ambulatory Visit: Admitting: Physical Therapy

## 2024-09-26 ENCOUNTER — Telehealth: Payer: Self-pay

## 2024-09-26 ENCOUNTER — Other Ambulatory Visit: Payer: Self-pay

## 2024-09-26 ENCOUNTER — Encounter: Payer: Self-pay | Admitting: Physical Therapy

## 2024-09-26 VITALS — BP 113/62 | HR 81

## 2024-09-26 DIAGNOSIS — R278 Other lack of coordination: Secondary | ICD-10-CM

## 2024-09-26 DIAGNOSIS — R2689 Other abnormalities of gait and mobility: Secondary | ICD-10-CM | POA: Diagnosis not present

## 2024-09-26 DIAGNOSIS — I69354 Hemiplegia and hemiparesis following cerebral infarction affecting left non-dominant side: Secondary | ICD-10-CM

## 2024-09-26 DIAGNOSIS — M6281 Muscle weakness (generalized): Secondary | ICD-10-CM

## 2024-09-26 NOTE — Telephone Encounter (Signed)
 Message received from [facility] stating the patient has been approved for [employer] [place] transportation.

## 2024-09-26 NOTE — Progress Notes (Signed)
" ° °  Telephone encounter was:  Successful.  Complex Care Management Note Care Guide Note  09/26/2024 Name: [name] MRN: [id number] DOB: 1966/03/16  [name] is a 59 y.o. year old female who is a primary care patient of Newlin, Enobong, MD . The community resource team was consulted for assistance with housing   SDOH screenings and interventions completed:  No        Care guide performed the following interventions: Patient provided with information about care guide support team and interviewed to confirm resource needs. Pt requested I call back  Follow Up Plan:  No further follow up planned at this time. The patient has been provided with needed resources.  Encounter Outcome:  Patient Visit Completed    Jon Colt [facility]  [facility] Guide, Phone: [phone] Fax: [phone] Website: [email or web address]    "

## 2024-09-26 NOTE — Therapy (Signed)
 " OUTPATIENT PHYSICAL THERAPY NEURO TREATMENT   Patient Name: [name] MRN: [id number] DOB:1966/08/27, 59 y.o., female Today's Date: 09/26/2024   PCP: Delbert Clam, MD REFERRING PROVIDER: Delbert Clam, MD  END OF SESSION:  PT End of Session - 09/26/24 1019     Visit Number 4    Number of Visits 17   16 visits plus Eval   Date for Recertification  11/04/24   for scheduling delays   Authorization Type [facility] Medicaid    Authorization - Visit Number 4    Authorization - Number of Visits 27    PT Start Time 1017    PT Stop Time 1100    PT Time Calculation (min) 43 min    Equipment Utilized During Treatment Gait belt    Activity Tolerance Patient tolerated treatment well    Behavior During Therapy WFL for tasks assessed/performed          Past Medical History:  Diagnosis Date   Clotting disorder    on xarelto    Essential hypertension    Heart murmur    a. 10/2015 Echo: EF 60-65%, no rwma, mild AI/MR, sev dil LA/RA, PASP .   Noncompliance    NSVT (nonsustained ventricular tachycardia) (HCC)    a. 10/2015 during admission for CVA/AF.   Persistent atrial fibrillation (HCC)    a. CHA2DS2VASC = 4-->Xarelto ;  b. 02/2016 Successful DCCV.  c. Recurrent fib   Pneumonia 2012 x 2; 2015   Stroke [facility])    a. 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   Transient ischemic attack (TIA) 01/2013   Past Surgical History:  Procedure Laterality Date   CARDIOVERSION N/A 02/18/2013   Procedure: CARDIOVERSION;  Surgeon: Jerel Balding, MD;  Location: [facility] ENDOSCOPY;  Service: Cardiovascular;  Laterality: N/A;   CARDIOVERSION N/A 02/23/2013   Procedure: CARDIOVERSION;  Surgeon: Alm LELON Clay, MD;  Location: [facility] OR;  Service: Cardiovascular;  Laterality: N/A;  BESIDE CV   CARDIOVERSION N/A 03/12/2016   Procedure: CARDIOVERSION;  Surgeon: Ezra GORMAN Shuck, MD;  Location: [facility] ENDOSCOPY;  Service: Cardiovascular;   Laterality: N/A;   IR PERCUTANEOUS ART THROMBECTOMY/INFUSION INTRACRANIAL INC DIAG ANGIO  05/05/2024   IR US  GUIDE VASC ACCESS RIGHT  05/05/2024   RADIOLOGY WITH ANESTHESIA N/A 05/05/2024   Procedure: RADIOLOGY WITH ANESTHESIA;  Surgeon: Radiologist, Medication, MD;  Location: [facility] OR;  Service: Radiology;  Laterality: N/A;   RIGHT/LEFT HEART CATH AND CORONARY ANGIOGRAPHY N/A 04/02/2021   Procedure: RIGHT/LEFT HEART CATH AND CORONARY ANGIOGRAPHY;  Surgeon: Claudene Victory LELON, MD;  Location: [facility] INVASIVE CV LAB;  Service: Cardiovascular;  Laterality: N/A;   TEE WITHOUT CARDIOVERSION N/A 02/18/2013   Procedure: TRANSESOPHAGEAL ECHOCARDIOGRAM (TEE);  Surgeon: Jerel Balding, MD;  Location: [facility] ENDOSCOPY;  Service: Cardiovascular;  Laterality: N/A;   TUBAL LIGATION  1992   Patient Active Problem List   Diagnosis Date Noted   Permanent atrial fibrillation (HCC) 07/10/2024   Coping style affecting medical condition 05/13/2024   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 2 without complication (HCC) 05/11/2024   Dysphagia due to recent stroke 05/11/2024   Acute right ACA stroke (HCC) 05/09/2024   Acute ischemic right MCA stroke (HCC) 05/05/2024   Stroke (cerebrum) (HCC) 05/05/2024   Type 2 diabetes mellitus without complication, without long-term current use of insulin  (HCC) 06/17/2021   Coronary artery disease involving native coronary artery of native heart without angina pectoris 04/10/2021   Chronic combined systolic  and diastolic CHF (congestive heart failure) (HCC) 04/01/2021   Longstanding persistent atrial fibrillation (HCC) 04/16/2020   Secondary hypercoagulable state 04/16/2020   Edema leg 05/17/2019   Severe obesity (BMI 35.0-39.9) with comorbidity (HCC) 04/04/2019   Prediabetes 04/04/2019   Seasonal allergic rhinitis due to pollen 12/14/2018   Chronic back pain 08/23/2018   OSA on CPAP 06/23/2018   Headache 01/29/2017   Refractive errors 04/15/2016   Medical  non-compliance 11/06/2015   Hyperlipidemia LDL goal <70 11/06/2015   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   NSVT (nonsustained ventricular tachycardia) (HCC) 11/06/2015   History of CVA (cerebrovascular accident)    Claudication of both lower extremities 03/22/2015   Chronic atrial fibrillation (HCC) 04/12/2014   Pap smear for cervical cancer screening 03/28/2014   Cigarette smoker 06/15/2013   Ventral hernia 06/15/2013   Chronic anticoagulation 02/28/2013   Hx-TIA (transient ischemic attack) 02/05/13 02/15/2013   Essential hypertension 02/15/2013    ONSET DATE: 08/04/2024 (date of referral)  REFERRING DIAG: I69.354 (ICD-10-CM) - Hemiparesis affecting left side as late effect of stroke (HCC)  THERAPY DIAG:  Other abnormalities of gait and mobility  Hemiplegia and hemiparesis following cerebral infarction affecting left non-dominant side (HCC)  Muscle weakness (generalized)  Other lack of coordination  Rationale for Evaluation and Treatment: Rehabilitation  SUBJECTIVE:                                                                                                                                                                                             SUBJECTIVE STATEMENT: No acute changes.  Has been doing HEP; going good; no questions (has assist to pull up HEP and play video with audio).  No recent falls.  Scheduled OT and SLP evaluations. Pt accompanied by: self and family member (daughter Hospital Doctor dropped pt off)  PERTINENT HISTORY:  PMH includes htn, NSVT, a-fib/atrial flutter (s/p cardioversion 02/2016), stroke (cerebral infarction due to embolism of right middle cerebral artery status post IV TPA (in 10/2015),  right MCA stroke in 01/2018 (after running out of Xarelto ), right ACA stroke in 04/2024 (status post thrombectomy with residual left hemiparesis, dysarthria), neuropathic pain, and DM type 2.  PAIN:  Are you having pain? No  PRECAUTIONS: Fall; L LE AFO  RED  FLAGS: None   WEIGHT BEARING RESTRICTIONS: No  FALLS: Has patient fallen in last 6 months? No  LIVING ENVIRONMENT: Lives with: lives with their family (daughter and 39 y.o. granddaughter) Lives in: House/apartment; 1 level house Stairs: Yes: External: 3 steps; none Has following equipment at home: Hemi walker, Wheelchair (manual), bed side commode, and standard walker; also has Stedy  PLOF: Independent prior  to August 2025 stroke.  Has aide 5 days/week for 2.5 hours each day.  Limited walking in home d/t fall concern; uses hemiwalker and 1 assist.  Uses manual w/c mainly or sits in chair.  Pt propels self a little in wheelchair but limited d/t L sided weakness; mostly gets pushed in w/c.   PATIENT GOALS: Being able to walk and be self sufficient on own.  Be able to walk without assistive device.  OBJECTIVE:  Note: Objective measures were completed at Evaluation unless otherwise noted.  DIAGNOSTIC FINDINGS:  MRI Brain 05/06/24: Multifocal acute right MCA territory infarcts including infarcts in the right basal ganglia, right frontal and right parietal lobes. Associated edema without mass effect. No midline shift. Areas of petechial hemorrhage in the right parietal lobe. Small additional acute infarct in the splenium of the corpus callosum. No mass occupying acute hemorrhage. No hydrocephalus. No mass lesion. Remote left cerebellar infarcts. Patchy T2/FLAIR hyperintensities in the white matter, compatible with chronic microvascular ischemic disease.  COGNITION: Overall cognitive status: Within functional limits for tasks assessed   SENSATION: No tingling/numbness reported.  Light touch intact B LE's.  COORDINATION: Intact R LE heel to shin in sitting; unable to assess L LE d/t weakness  EDEMA:  No LE swelling noted  MUSCLE TONE: L UE/LE spasticity/tone noted  POSTURE: rounded shoulders and forward head  LOWER EXTREMITY MMT:    MMT Right Eval Left Eval  Hip flexion  4+/5 2/5  Hip extension    Hip abduction    Hip adduction    Hip internal rotation    Hip external rotation    Knee flexion 4+/5 2/5  Knee extension 4+/5 4-/5  Ankle dorsiflexion 4+/5 1/5  Ankle plantarflexion At least 3/5 AROM At least 1/5  Ankle inversion    Ankle eversion    (Blank rows = not tested)  TRANSFERS: Sit to stand: Mod A  Assistive device utilized: Hemi walker     Stand to sit: Mod A  Assistive device utilized: Hemi walker      GAIT: Findings: Gait Characteristics: L LE externally rotated with decreased L LE foot clearance and step length; increased R lean to advance L LE; decreased L heel strike, Distance walked: 24 feet, Assistive device utilized:Hemi walker, Level of assistance: Min A, and Comments: decreased gait speed; assist to steady  FUNCTIONAL TESTS:  5 times sit to stand: Not appropriate at this time d/t assist needs 10 meter walk test: Not appropriate at this time d/t assist needs/limited mobility  PATIENT SURVEYS:  TBA                                                                                                                              TREATMENT DATE:09/26/24  Self Care: BP and HR taken in sitting at rest beginning of session (see below for details). Vitals:   09/26/24 1020  BP: 113/62  Pulse: 81   Therapeutic Activity: : 0.064 m/sec (2 minutes  35.63 seconds) with R hemi-walker, L AFO, and L toe cap on personal shoe Sit to stand (from manual w/c) with 2 inch box under R LE to increase L LE WB'ing/activation: x5 reps Notes: mod assist 1st 2 reps; min to mod assist 3rd rep; min assist 4th-5th rep; initial vc's for technique; assist for L LE positioning  Gait training: X105 feet with hemi-walker (pt wearing L AFO and toe cap on personal shoe) Notes: CGA; vc's to increase B LE step length and increase BOS (improved with repetition and cueing); decreased gait speed; limited distance d/t L LE fatigue (more difficulty advancing L LE with  increased distance ambulating)   PATIENT EDUCATION: Education details: Continue to do HEP (as much as able).  Bring L AFO and shoes with toe cap cover each session to utilize for gait training. Person educated: Patient Education method: Explanation, Demonstration, Tactile cues, Verbal cues, and Handouts Education comprehension: verbalized understanding, returned demonstration, verbal cues required, tactile cues required, and needs further education  HOME EXERCISE PROGRAM: Pt reports she has assist at home for L LE exercises. Access Code: [email or web address] URL: [email or web address] Date: 09/19/2024 Prepared by: Damien Caulk  Exercises - Seated March  - 1 x daily - 3-5 x weekly - 2-3 sets - 10 reps - Seated Long Arc Quad  - 1 x daily - 3-5 x weekly - 1-2 sets - 10 reps - Seated Knee Flexion Slide  - 1 x daily - 3-5 x weekly - 1-2 sets - 5-10 reps - Seated Hip Adduction Squeeze with Ball  - 1 x daily - 3-5 x weekly - 1-2 sets - 10 reps - 3 second hold - Seated Hip Abduction  - 1 x daily - 3-5 x weekly - 1-2 sets - 10 reps  GOALS: Goals reviewed with patient? Yes  SHORT TERM GOALS: Target date: 09/22/2024   Pt will be supervision with initial HEP in order to improve strength and balance in order to decrease fall risk and improve function at home for ADL's. Baseline: Issued 09/19/24 Goal status: NOT MET (1st therapy visit since eval 09/19/24 and pt hasn't been able to do HEP yet that was issued on 09/19/24)  2.  Pt will be min assist with transfers with use of hemi-walker to improve ease of transfers for ADL's. Baseline: Mod assist with hemi-walker on Eval; mod assist 1st trial and min assist 2nd and 3rd trial with mod cueing on 09/22/24 Goal status: PROGRESSING   3.  Pt will be able to ambulate 50 feet min assist with hemi-walker for safe mobility within home for ADL's. Baseline: 24 feet min assist with hemi-walker on Eval; 67 feet CGA with hemi-walker 09/22/24 Goal status: MET  LONG  TERM GOALS: Target date: 10/20/2024  Pt will be independent with final HEP in order to improve strength and balance in order to decrease fall risk and improve function at home for ADL's. Baseline: To be issued Goal status: INITIAL  2.  Pt will be SBA with transfers with use of LRAD to improve ease and safety of transfers for ADL's. Baseline: Mod assist with hemi-walker Goal status: INITIAL  3.  Pt will be able to ambulate 100 feet SBA with LRAD for safe mobility within home for ADL's. Baseline: 24 feet min assist with hemi-walker Goal status: INITIAL  4.  Pt will decrease 5 Time Sit to Stand by at least 3 seconds in order to demonstrate clinically significant improvement in LE strength. Baseline: TBA (not appropriate on eval) Goal status: INITIAL  5.  Pt will increase by at least 0.13 m/s in order to demonstrate clinically significant improvement in community ambulation.  Baseline: TBA (not appropriate on eval) Goal status: INITIAL   ASSESSMENT:  CLINICAL IMPRESSION: Patient was seen today for physical therapy treatment to address outcome measure, transfer, and gait.  Focused session on , gait with hemi-walker use (improving step length and BOS), and transfer training (increasing L LE WB'ing and activation with transfers).  Pt scored 0.064 m/sec on the 10 Meter Walk Test indicating pt is a household ambulator (Cut off scores: <0.4 m/s = household Ambulator, 0.4-0.8 m/s = limited community Ambulator, >0.8 m/s = community Ambulator, >1.2 m/s = crossing a street, <1.0 = increased fall risk).  Patient continues to be limited by L sided weakness, balance, and functional mobility.  They demonstrate improvement in distance able to ambulate with assist today (105 feet with hemi-walker, L AFO, and L toe cap on personal shoe).  They would continue to benefit from skilled PT to address impairments as noted and progress towards long term goals.  OBJECTIVE IMPAIRMENTS: Abnormal gait,  cardiopulmonary status limiting activity, decreased activity tolerance, decreased balance, decreased cognition, decreased coordination, decreased endurance, decreased knowledge of condition, decreased knowledge of use of DME, decreased mobility, difficulty walking, decreased ROM, decreased strength, decreased safety awareness, impaired flexibility, impaired tone, impaired UE functional use, improper body mechanics, and postural dysfunction.   ACTIVITY LIMITATIONS: carrying, lifting, bending, standing, squatting, stairs, transfers, bed mobility, bathing, toileting, dressing, reach over head, hygiene/grooming, and locomotion level  PARTICIPATION LIMITATIONS: meal prep, cleaning, laundry, driving, shopping, and community activity  PERSONAL FACTORS: Age, Fitness, Past/current experiences, and 1-2 comorbidities: h/o prior CVA; CHF; NSVT are also affecting patient's functional outcome.   REHAB POTENTIAL: Good  CLINICAL DECISION MAKING: Evolving/moderate complexity  EVALUATION COMPLEXITY: Moderate  PLAN:  PT FREQUENCY: 2x/week  PT DURATION: 8 weeks  PLANNED INTERVENTIONS: 97164- PT Re-evaluation, 97750- Physical Performance Testing, 97110-Therapeutic exercises, 97530- Therapeutic activity, [id number]- Neuromuscular re-education, 97535- Self Care, [id number]- Manual therapy, [id number]- Gait training, [phone]- Orthotic Initial, [phone]- Orthotic/Prosthetic subsequent, [phone]- Aquatic Therapy, [phone]- Splinting, [phone]- Electrical stimulation (manual), [phone] (1-2 muscles), [id number] (3+ muscles)- Dry Needling, Patient/Family education, Balance training, Stair training, Taping, Joint mobilization, Spinal mobilization, Cognitive remediation, DME instructions, Wheelchair mobility training, Cryotherapy, Moist heat, and Biofeedback  PLAN FOR NEXT SESSION: Check on HEP.  Transfer training.  Gait training with hemi-walker; pt to bring L LE AFO and shoes with toe cap cover to utilize during sessions.  Balance; strengthening;  coordination.  Assess 5 time sit to stand when appropriate.   Damien Caulk, PT 09/26/2024, 6:36 PM        "

## 2024-09-29 ENCOUNTER — Encounter: Payer: Self-pay | Admitting: Physical Therapy

## 2024-09-29 ENCOUNTER — Ambulatory Visit: Admitting: Physical Therapy

## 2024-09-29 VITALS — BP 128/89 | HR 68

## 2024-09-29 DIAGNOSIS — R2689 Other abnormalities of gait and mobility: Secondary | ICD-10-CM | POA: Diagnosis not present

## 2024-09-29 DIAGNOSIS — M6281 Muscle weakness (generalized): Secondary | ICD-10-CM

## 2024-09-29 DIAGNOSIS — R278 Other lack of coordination: Secondary | ICD-10-CM

## 2024-09-29 DIAGNOSIS — I69354 Hemiplegia and hemiparesis following cerebral infarction affecting left non-dominant side: Secondary | ICD-10-CM

## 2024-09-29 NOTE — Therapy (Signed)
 " OUTPATIENT PHYSICAL THERAPY NEURO TREATMENT   Patient Name: [name] MRN: [id number] DOB:Dec 02, 1965, 59 y.o., female Today's Date: 09/29/2024   PCP: Elizabeth Clam, MD REFERRING PROVIDER: Delbert Clam, MD  END OF SESSION:  PT End of Session - 09/29/24 1105     Visit Number 5    Number of Visits 17   16 visits plus Eval   Date for Recertification  11/04/24   for scheduling delays   Authorization Type [facility] Medicaid    Authorization - Visit Number 5    Authorization - Number of Visits 27    PT Start Time 1102    PT Stop Time 1148    PT Time Calculation (min) 46 min    Equipment Utilized During Treatment Gait belt    Activity Tolerance Patient tolerated treatment well    Behavior During Therapy WFL for tasks assessed/performed          Past Medical History:  Diagnosis Date   Clotting disorder    on xarelto    Essential hypertension    Heart murmur    a. 10/2015 Echo: EF 60-65%, no rwma, mild AI/MR, sev dil LA/RA, PASP .   Noncompliance    NSVT (nonsustained ventricular tachycardia) (HCC)    a. 10/2015 during admission for CVA/AF.   Persistent atrial fibrillation (HCC)    a. CHA2DS2VASC = 4-->Xarelto ;  b. 02/2016 Successful DCCV.  c. Recurrent fib   Pneumonia 2012 x 2; 2015   Stroke [facility])    a. 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   Transient ischemic attack (TIA) 01/2013   Past Surgical History:  Procedure Laterality Date   CARDIOVERSION N/A 02/18/2013   Procedure: CARDIOVERSION;  Surgeon: Elizabeth Balding, MD;  Location: [facility] ENDOSCOPY;  Service: Cardiovascular;  Laterality: N/A;   CARDIOVERSION N/A 02/23/2013   Procedure: CARDIOVERSION;  Surgeon: [name] Clay, MD;  Location: [facility] OR;  Service: Cardiovascular;  Laterality: N/A;  BESIDE CV   CARDIOVERSION N/A 03/12/2016   Procedure: CARDIOVERSION;  Surgeon: Elizabeth GORMAN Shuck, MD;  Location: [facility] ENDOSCOPY;  Service: Cardiovascular;   Laterality: N/A;   IR PERCUTANEOUS ART THROMBECTOMY/INFUSION INTRACRANIAL INC DIAG ANGIO  05/05/2024   IR US  GUIDE VASC ACCESS RIGHT  05/05/2024   RADIOLOGY WITH ANESTHESIA N/A 05/05/2024   Procedure: RADIOLOGY WITH ANESTHESIA;  Surgeon: [name], Medication, MD;  Location: [facility] OR;  Service: Radiology;  Laterality: N/A;   RIGHT/LEFT HEART CATH AND CORONARY ANGIOGRAPHY N/A 04/02/2021   Procedure: RIGHT/LEFT HEART CATH AND CORONARY ANGIOGRAPHY;  Surgeon: Claudene Victory LELON, MD;  Location: [facility] INVASIVE CV LAB;  Service: Cardiovascular;  Laterality: N/A;   TEE WITHOUT CARDIOVERSION N/A 02/18/2013   Procedure: TRANSESOPHAGEAL ECHOCARDIOGRAM (TEE);  Surgeon: Elizabeth Balding, MD;  Location: [facility] ENDOSCOPY;  Service: Cardiovascular;  Laterality: N/A;   TUBAL LIGATION  1992   Patient Active Problem List   Diagnosis Date Noted   Permanent atrial fibrillation (HCC) 07/10/2024   Coping style affecting medical condition 05/13/2024   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 2 without complication (HCC) 05/11/2024   Dysphagia due to recent stroke 05/11/2024   Acute right ACA stroke (HCC) 05/09/2024   Acute ischemic right MCA stroke (HCC) 05/05/2024   Stroke (cerebrum) (HCC) 05/05/2024   Type 2 diabetes mellitus without complication, without long-term current use of insulin  (HCC) 06/17/2021   Coronary artery disease involving native coronary artery of native heart without angina pectoris 04/10/2021   Chronic combined systolic  and diastolic CHF (congestive heart failure) (HCC) 04/01/2021   Longstanding persistent atrial fibrillation (HCC) 04/16/2020   Secondary hypercoagulable state 04/16/2020   Edema leg 05/17/2019   Severe obesity (BMI 35.0-39.9) with comorbidity (HCC) 04/04/2019   Prediabetes 04/04/2019   Seasonal allergic rhinitis due to pollen 12/14/2018   Chronic back pain 08/23/2018   OSA on CPAP 06/23/2018   Headache 01/29/2017   Refractive errors 04/15/2016   Medical  non-compliance 11/06/2015   Hyperlipidemia LDL goal <70 11/06/2015   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   NSVT (nonsustained ventricular tachycardia) (HCC) 11/06/2015   History of CVA (cerebrovascular accident)    Claudication of both lower extremities 03/22/2015   Chronic atrial fibrillation (HCC) 04/12/2014   Pap smear for cervical cancer screening 03/28/2014   Cigarette smoker 06/15/2013   Ventral hernia 06/15/2013   Chronic anticoagulation 02/28/2013   Hx-TIA (transient ischemic attack) 02/05/13 02/15/2013   Essential hypertension 02/15/2013    ONSET DATE: 08/04/2024 (date of referral)  REFERRING DIAG: I69.354 (ICD-10-CM) - Hemiparesis affecting left side as late effect of stroke (HCC)  THERAPY DIAG:  Other abnormalities of gait and mobility  Hemiplegia and hemiparesis following cerebral infarction affecting left non-dominant side (HCC)  Muscle weakness (generalized)  Other lack of coordination  Rationale for Evaluation and Treatment: Rehabilitation  SUBJECTIVE:                                                                                                                                                                                             SUBJECTIVE STATEMENT: No recent falls.  No pain.  No acute changes.  HEP going good; no questions; home health aide helped her with it yesterday. Pt accompanied by: self and family member (son [name])  PERTINENT HISTORY:  PMH includes htn, NSVT, a-fib/atrial flutter (s/p cardioversion 02/2016), stroke (cerebral infarction due to embolism of right middle cerebral artery status post IV TPA (in 10/2015),  right MCA stroke in 01/2018 (after running out of Xarelto ), right ACA stroke in 04/2024 (status post thrombectomy with residual left hemiparesis, dysarthria), neuropathic pain, and DM type 2.  PAIN:  Are you having pain? No  PRECAUTIONS: Fall; L LE AFO  RED FLAGS: None   WEIGHT BEARING RESTRICTIONS: No  FALLS: Has patient  fallen in last 6 months? No  LIVING ENVIRONMENT: Lives with: lives with their family (daughter and 58 y.o. granddaughter) Lives in: House/apartment; 1 level house Stairs: Yes: External: 3 steps; none Has following equipment at home: Hemi walker, Wheelchair (manual), bed side commode, and standard walker; also has Stedy  PLOF: Independent prior to August 2025 stroke.  Has aide 5 days/week for 2.5 hours  each day.  Limited walking in home d/t fall concern; uses hemiwalker and 1 assist.  Uses manual w/c mainly or sits in chair.  Pt propels self a little in wheelchair but limited d/t L sided weakness; mostly gets pushed in w/c.   PATIENT GOALS: Being able to walk and be self sufficient on own.  Be able to walk without assistive device.  OBJECTIVE:  Note: Objective measures were completed at Evaluation unless otherwise noted.  DIAGNOSTIC FINDINGS:  MRI Brain 05/06/24: Multifocal acute right MCA territory infarcts including infarcts in the right basal ganglia, right frontal and right parietal lobes. Associated edema without mass effect. No midline shift. Areas of petechial hemorrhage in the right parietal lobe. Small additional acute infarct in the splenium of the corpus callosum. No mass occupying acute hemorrhage. No hydrocephalus. No mass lesion. Remote left cerebellar infarcts. Patchy T2/FLAIR hyperintensities in the white matter, compatible with chronic microvascular ischemic disease.  COGNITION: Overall cognitive status: Within functional limits for tasks assessed   SENSATION: No tingling/numbness reported.  Light touch intact B LE's.  COORDINATION: Intact R LE heel to shin in sitting; unable to assess L LE d/t weakness  EDEMA:  No LE swelling noted  MUSCLE TONE: L UE/LE spasticity/tone noted  POSTURE: rounded shoulders and forward head  LOWER EXTREMITY MMT:    MMT Right Eval Left Eval  Hip flexion 4+/5 2/5  Hip extension    Hip abduction    Hip adduction    Hip  internal rotation    Hip external rotation    Knee flexion 4+/5 2/5  Knee extension 4+/5 4-/5  Ankle dorsiflexion 4+/5 1/5  Ankle plantarflexion At least 3/5 AROM At least 1/5  Ankle inversion    Ankle eversion    (Blank rows = not tested)  TRANSFERS: Sit to stand: Mod A  Assistive device utilized: Hemi walker     Stand to sit: Mod A  Assistive device utilized: Hemi walker      GAIT: Findings: Gait Characteristics: L LE externally rotated with decreased L LE foot clearance and step length; increased R lean to advance L LE; decreased L heel strike, Distance walked: 24 feet, Assistive device utilized:Hemi walker, Level of assistance: Min A, and Comments: decreased gait speed; assist to steady  FUNCTIONAL TESTS:  5 times sit to stand: Not appropriate at this time d/t assist needs 10 meter walk test: Not appropriate at this time d/t assist needs/limited mobility  PATIENT SURVEYS:  TBA                                                                                                                              TREATMENT DATE:09/29/24  Self Care: BP and HR taken in sitting at rest beginning of session (see below for details). Vitals:   09/29/24 1107  BP: 128/89  Pulse: 68   Therapeutic Activity: Sit to stands from manual w/c: x5 reps  Notes: min assist x3 reps; CGA last 2  reps (improved with feedback via pressure down through tibia thru distal thigh immediately prior to standing to improve L LE initiation/activation); vc's for technique and positioning; assist for L LE positioning  Gait training: x102 feet with R hemi-walker; CGA (pt wearing L AFO and toe cap on personal shoe) Notes: vc's to increase L LE WB'ing and weight shift to L side; vc's to increase B LE step length and BOS; limited distance ambulating d/t pt fatigue (d/t pt starting to compensate with pelvic/trunk rotation to advance L LE)  PATIENT EDUCATION: Education details: Continue to do HEP (as much as able).  Bring L  AFO and shoes with toe cap cover each session to utilize for gait training. Person educated: Patient Education method: Explanation, Demonstration, Tactile cues, Verbal cues, and Handouts Education comprehension: verbalized understanding, returned demonstration, verbal cues required, tactile cues required, and needs further education  HOME EXERCISE PROGRAM: Pt reports she has assist at home for L LE exercises. Access Code: [email or web address] URL: [email or web address] Date: 09/19/2024 Prepared by: Damien Caulk  Exercises - Seated March  - 1 x daily - 3-5 x weekly - 2-3 sets - 10 reps - Seated Long Arc Quad  - 1 x daily - 3-5 x weekly - 1-2 sets - 10 reps - Seated Knee Flexion Slide  - 1 x daily - 3-5 x weekly - 1-2 sets - 5-10 reps - Seated Hip Adduction Squeeze with Ball  - 1 x daily - 3-5 x weekly - 1-2 sets - 10 reps - 3 second hold - Seated Hip Abduction  - 1 x daily - 3-5 x weekly - 1-2 sets - 10 reps  GOALS: Goals reviewed with patient? Yes  SHORT TERM GOALS: Target date: 09/22/2024   Pt will be supervision with initial HEP in order to improve strength and balance in order to decrease fall risk and improve function at home for ADL's. Baseline: Issued 09/19/24 Goal status: NOT MET (1st therapy visit since eval 09/19/24 and pt hasn't been able to do HEP yet that was issued on 09/19/24)  2.  Pt will be min assist with transfers with use of hemi-walker to improve ease of transfers for ADL's. Baseline: Mod assist with hemi-walker on Eval; mod assist 1st trial and min assist 2nd and 3rd trial with mod cueing on 09/22/24 Goal status: PROGRESSING   3.  Pt will be able to ambulate 50 feet min assist with hemi-walker for safe mobility within home for ADL's. Baseline: 24 feet min assist with hemi-walker on Eval; 67 feet CGA with hemi-walker 09/22/24 Goal status: MET  LONG TERM GOALS: Target date: 10/20/2024  Pt will be independent with final HEP in order to improve strength and balance in  order to decrease fall risk and improve function at home for ADL's. Baseline: To be issued Goal status: INITIAL  2.  Pt will be SBA with transfers with use of LRAD to improve ease and safety of transfers for ADL's. Baseline: Mod assist with hemi-walker Goal status: INITIAL  3.  Pt will be able to ambulate 100 feet SBA with LRAD for safe mobility within home for ADL's. Baseline: 24 feet min assist with hemi-walker Goal status: INITIAL  4.  Pt will decrease 5 Time Sit to Stand by at least 3 seconds in order to demonstrate clinically significant improvement in LE strength. Baseline: TBA (not appropriate on eval) Goal status: INITIAL  5.  Pt will increase by at least 0.13 m/s in order to demonstrate clinically significant improvement in community ambulation.  Baseline: TBA (not appropriate on eval) Goal status: INITIAL   ASSESSMENT:  CLINICAL IMPRESSION: Patient was seen today for physical therapy treatment to address transfers and gait.  Focused session on improving technique of transfers (improving use and initiation of L LE) and improving gait mechanics (vc's to increase step length, BOS, and L weight shift).  Patient continues to be limited by L LE weakness and balance.  They demonstrate improvement in weight shift to L side during ambulation with cueing.  They would continue to benefit from skilled PT to address impairments as noted and progress towards long term goals.  OBJECTIVE IMPAIRMENTS: Abnormal gait, cardiopulmonary status limiting activity, decreased activity tolerance, decreased balance, decreased cognition, decreased coordination, decreased endurance, decreased knowledge of condition, decreased knowledge of use of DME, decreased mobility, difficulty walking, decreased ROM, decreased strength, decreased safety awareness, impaired flexibility, impaired tone, impaired UE functional use, improper body mechanics, and postural dysfunction.   ACTIVITY LIMITATIONS: carrying,  lifting, bending, standing, squatting, stairs, transfers, bed mobility, bathing, toileting, dressing, reach over head, hygiene/grooming, and locomotion level  PARTICIPATION LIMITATIONS: meal prep, cleaning, laundry, driving, shopping, and community activity  PERSONAL FACTORS: Age, Fitness, Past/current experiences, and 1-2 comorbidities: h/o prior CVA; CHF; NSVT are also affecting patient's functional outcome.   REHAB POTENTIAL: Good  CLINICAL DECISION MAKING: Evolving/moderate complexity  EVALUATION COMPLEXITY: Moderate  PLAN:  PT FREQUENCY: 2x/week  PT DURATION: 8 weeks  PLANNED INTERVENTIONS: 97164- PT Re-evaluation, 97750- Physical Performance Testing, 97110-Therapeutic exercises, 97530- Therapeutic activity, [id number]- Neuromuscular re-education, 97535- Self Care, [id number]- Manual therapy, [id number]- Gait training, [phone]- Orthotic Initial, [phone]- Orthotic/Prosthetic subsequent, [phone]- Aquatic Therapy, [phone]- Splinting, [phone]- Electrical stimulation (manual), [phone] (1-2 muscles), 20561 (3+ muscles)- Dry Needling, Patient/Family education, Balance training, Stair training, Taping, Joint mobilization, Spinal mobilization, Cognitive remediation, DME instructions, Wheelchair mobility training, Cryotherapy, Moist heat, and Biofeedback  PLAN FOR NEXT SESSION: Check on HEP.  Transfer training.  Gait training with hemi-walker; pt to bring L LE AFO and shoes with toe cap cover to utilize during sessions.  Balance; strengthening; coordination.  Assess 5 time sit to stand when appropriate.  Has OT and SLP evaluations scheduled.   Damien Caulk, PT 09/29/2024, 12:05 PM        "

## 2024-09-30 ENCOUNTER — Other Ambulatory Visit: Payer: Self-pay

## 2024-09-30 NOTE — Patient Instructions (Signed)
 Visit Information  [name] was given information about Medicaid Managed Care team care coordination services as a part of their [facility] Community Plan Medicaid benefit.   If you would like to schedule transportation through your [facility], please call the following number at least 2 days in advance of your appointment: [phone]   Rides for urgent appointments can also be made after hours by calling Member Services.  Call the Behavioral Health Crisis Line at [phone], at any time, 24 hours a day, 7 days a week. If you are in danger or need immediate medical attention call 911.  Please see education materials related to food and housing resources provided by mail.   Patient verbalizes understanding of instructions and care plan provided today and agrees to view in MyChart. Active MyChart status and patient understanding of how to access instructions and care plan via MyChart confirmed with patient.     Telephone follow up appointment with Managed Medicaid care management team member scheduled for: 0130/2026 at 2:30pm  [name], [place]/VBCI - [facility] Social Worker [phone]   Following is a copy of your plan of care:   Goals Addressed             This Visit's Progress    BSW goal       Current SDOH Barriers:  Financial constraints related to recent stroke Utility resources Dental need Rehab physical therapy need  Interventions: Patient interviewed and appropriate screenings performed Referred patient to community resources  Provided patient with information about utility assistance resources in the community, BSW will provide patient list of dental providers that accept medicaid.  09/01/2024 Pt informed BSW she has outpatient rehab set up for January and February 2026. Pt was provided with member services phone number for [facility] since dental provider list did not work for her. Pt will be calling to find  in-network provider.  Pt mentioned needing referrals for other therapy services that will be provided at the rehab center. Pt was encouraged to f/u with Dr. Newlin about these referrals. 09/30/2024 BSW will provide patient housing and food resources via mail.  BSW encouraged patient to reach out to [facility] and inquire about the possibility of assisting her with her current claim. Applicant has applied on her own with daughter and has not heard back from claim. Phone number provided. BSW provided applicant member services # for pt to reach out re dental providers.

## 2024-09-30 NOTE — Patient Outreach (Signed)
 Social Drivers of Health  Community Resource and Care Coordination Visit Note   09/30/2024  Name: [name] MRN: [id number] DOB:03-15-1966  Situation: Referral received for [facility] needs assessment and assistance related to Housing  Financial [employer] . I obtained verbal consent from Patient.  Visit completed with Patient on the phone.   Background:   SDOH Interventions Today    Flowsheet Row Most Recent Value  SDOH Interventions   Food Insecurity Interventions --  [patient is waiting for recertification decision for FNS. Pt will contact case worker. Food schedule for january will be sent to patient.]  Housing Interventions --  [housing resources will be provided by BSW.]  Transportation Interventions Patient Resources (Friends/Family), [employer] uses medicaid transportation benefit.]  Utilities Interventions Intervention Not Indicated     Assessment:   Goals Addressed             This Visit's Progress    BSW goal       Current SDOH Barriers:  Financial constraints related to recent stroke Utility resources Dental need Rehab physical therapy need  Interventions: Patient interviewed and appropriate screenings performed Referred patient to community resources  Provided patient with information about utility assistance resources in the community, BSW will provide patient list of dental providers that accept medicaid.  09/01/2024 Pt informed BSW she has outpatient rehab set up for January and February 2026. Pt was provided with member services phone number for [facility] since dental provider list did not work for her. Pt will be calling to find in-network provider.  Pt mentioned needing referrals for other therapy services that will be provided at the rehab center. Pt was encouraged to f/u with Dr. Newlin about these referrals. 09/30/2024 BSW will provide patient housing and food resources via mail.  BSW encouraged patient to reach out to [facility]  and inquire about the possibility of assisting her with her current claim. Applicant has applied on her own with daughter and has not heard back from claim. Phone number provided. BSW provided applicant member services # for pt to reach out re dental providers.         Recommendation:   attend all scheduled provider appointments call for transportation assistance at least one week before appointments ask for help if you don't understand your health insurance benefits F/u with [facility] re dental providers in her network and servant center re disability application case management.   Follow Up Plan:   Telephone follow up appointment date/time:  10/14/2024 at 2:30pm  [name], BSW [place]/VBCI - [facility] Social Worker [phone]

## 2024-10-03 ENCOUNTER — Telehealth: Admitting: *Deleted

## 2024-10-04 ENCOUNTER — Telehealth: Payer: Self-pay

## 2024-10-04 ENCOUNTER — Encounter: Payer: Self-pay | Admitting: Physical Therapy

## 2024-10-04 ENCOUNTER — Ambulatory Visit: Admitting: Physical Therapy

## 2024-10-04 VITALS — BP 121/66 | HR 69

## 2024-10-04 DIAGNOSIS — R2689 Other abnormalities of gait and mobility: Secondary | ICD-10-CM | POA: Diagnosis not present

## 2024-10-04 DIAGNOSIS — M6281 Muscle weakness (generalized): Secondary | ICD-10-CM

## 2024-10-04 DIAGNOSIS — R278 Other lack of coordination: Secondary | ICD-10-CM

## 2024-10-04 DIAGNOSIS — I69354 Hemiplegia and hemiparesis following cerebral infarction affecting left non-dominant side: Secondary | ICD-10-CM

## 2024-10-04 NOTE — Therapy (Signed)
 " OUTPATIENT PHYSICAL THERAPY NEURO TREATMENT   Patient Name: [name] MRN: [id number] DOB:25-Nov-1965, 59 y.o., female Today's Date: 10/04/2024   PCP: Delbert Clam, MD REFERRING PROVIDER: Delbert Clam, MD  END OF SESSION:  PT End of Session - 10/04/24 1238     Visit Number 6    Number of Visits 17   16 visits plus Eval   Date for Recertification  11/04/24   for scheduling delays   Authorization Type [facility] Medicaid    Authorization - Visit Number 6    Authorization - Number of Visits 27    PT Start Time 1236   pt arrived late   PT Stop Time 1315    PT Time Calculation (min) 39 min    Equipment Utilized During Treatment Gait belt    Activity Tolerance Patient tolerated treatment well    Behavior During Therapy WFL for tasks assessed/performed          Past Medical History:  Diagnosis Date   Clotting disorder    on xarelto    Essential hypertension    Heart murmur    a. 10/2015 Echo: EF 60-65%, no rwma, mild AI/MR, sev dil LA/RA, PASP .   Noncompliance    NSVT (nonsustained ventricular tachycardia) (HCC)    a. 10/2015 during admission for CVA/AF.   Persistent atrial fibrillation (HCC)    a. CHA2DS2VASC = 4-->Xarelto ;  b. 02/2016 Successful DCCV.  c. Recurrent fib   Pneumonia 2012 x 2; 2015   Stroke [facility])    a. 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   Transient ischemic attack (TIA) 01/2013   Past Surgical History:  Procedure Laterality Date   CARDIOVERSION N/A 02/18/2013   Procedure: CARDIOVERSION;  Surgeon: Jerel Balding, MD;  Location: [facility] ENDOSCOPY;  Service: Cardiovascular;  Laterality: N/A;   CARDIOVERSION N/A 02/23/2013   Procedure: CARDIOVERSION;  Surgeon: Alm LELON Clay, MD;  Location: [facility] [place] [facility];  Service: Cardiovascular;  Laterality: N/A;  BESIDE CV   CARDIOVERSION N/A 03/12/2016   Procedure: CARDIOVERSION;  Surgeon: Ezra GORMAN Shuck, MD;  Location: [facility] ENDOSCOPY;   Service: Cardiovascular;  Laterality: N/A;   IR PERCUTANEOUS ART THROMBECTOMY/INFUSION INTRACRANIAL INC DIAG ANGIO  05/05/2024   IR US  GUIDE VASC ACCESS RIGHT  05/05/2024   RADIOLOGY WITH ANESTHESIA N/A 05/05/2024   Procedure: RADIOLOGY WITH ANESTHESIA;  Surgeon: Radiologist, Medication, MD;  Location: [facility] OR;  Service: Radiology;  Laterality: N/A;   RIGHT/LEFT HEART CATH AND CORONARY ANGIOGRAPHY N/A 04/02/2021   Procedure: RIGHT/LEFT HEART CATH AND CORONARY ANGIOGRAPHY;  Surgeon: Claudene Victory LELON, MD;  Location: [facility] INVASIVE CV LAB;  Service: Cardiovascular;  Laterality: N/A;   TEE WITHOUT CARDIOVERSION N/A 02/18/2013   Procedure: TRANSESOPHAGEAL ECHOCARDIOGRAM (TEE);  Surgeon: Jerel Balding, MD;  Location: [facility] ENDOSCOPY;  Service: Cardiovascular;  Laterality: N/A;   TUBAL LIGATION  1992   Patient Active Problem List   Diagnosis Date Noted   Permanent atrial fibrillation (HCC) 07/10/2024   Coping style affecting medical condition 05/13/2024   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 2 without complication (HCC) 05/11/2024   Dysphagia due to recent stroke 05/11/2024   Acute right ACA stroke (HCC) 05/09/2024   Acute ischemic right MCA stroke (HCC) 05/05/2024   Stroke (cerebrum) (HCC) 05/05/2024   Type 2 diabetes mellitus without complication, without long-term current use of insulin  (HCC) 06/17/2021   Coronary artery disease involving native coronary artery of native heart without angina pectoris 04/10/2021  Chronic combined systolic and diastolic CHF (congestive heart failure) (HCC) 04/01/2021   Longstanding persistent atrial fibrillation (HCC) 04/16/2020   Secondary hypercoagulable state 04/16/2020   Edema leg 05/17/2019   Severe obesity (BMI 35.0-39.9) with comorbidity (HCC) 04/04/2019   Prediabetes 04/04/2019   Seasonal allergic rhinitis due to pollen 12/14/2018   Chronic back pain 08/23/2018   OSA on CPAP 06/23/2018   Headache 01/29/2017   Refractive errors  04/15/2016   Medical non-compliance 11/06/2015   Hyperlipidemia LDL goal <70 11/06/2015   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   NSVT (nonsustained ventricular tachycardia) (HCC) 11/06/2015   History of CVA (cerebrovascular accident)    Claudication of both lower extremities 03/22/2015   Chronic atrial fibrillation (HCC) 04/12/2014   Pap smear for cervical cancer screening 03/28/2014   Cigarette smoker 06/15/2013   Ventral hernia 06/15/2013   Chronic anticoagulation 02/28/2013   Hx-TIA (transient ischemic attack) 02/05/13 02/15/2013   Essential hypertension 02/15/2013    ONSET DATE: 08/04/2024 (date of referral)  REFERRING DIAG: I69.354 (ICD-10-CM) - Hemiparesis affecting left side as late effect of stroke (HCC)  THERAPY DIAG:  Other abnormalities of gait and mobility  Muscle weakness (generalized)  Hemiplegia and hemiparesis following cerebral infarction affecting left non-dominant side (HCC)  Other lack of coordination  Rationale for Evaluation and Treatment: Rehabilitation  SUBJECTIVE:                                                                                                                                                                                             SUBJECTIVE STATEMENT: No current pain.  No recent falls.  Having a tough day; teary beginning of therapy session; things feeling like too much (having to depend on someone to do things for her; concerns of family having to care for her); feeling overwhelmed and rushed today.  Therapeutic listening/support provided.  Pt planning to talk to family member regarding how she feels.  L hip and knee hurt when bend it (but only after laying in bed for a while; may be d/t stiffness?). Pt accompanied by: self and family member (daughter Hospital Doctor dropped pt off)  PERTINENT HISTORY:  PMH includes htn, NSVT, a-fib/atrial flutter (s/p cardioversion 02/2016), stroke (cerebral infarction due to embolism of right middle cerebral  artery status post IV TPA (in 10/2015),  right MCA stroke in 01/2018 (after running out of Xarelto ), right ACA stroke in 04/2024 (status post thrombectomy with residual left hemiparesis, dysarthria), neuropathic pain, and DM type 2.  PAIN:  Are you having pain? No  PRECAUTIONS: Fall; L LE AFO  RED FLAGS: None   WEIGHT BEARING RESTRICTIONS: No  FALLS: Has patient fallen  in last 6 months? No  LIVING ENVIRONMENT: Lives with: lives with their family (daughter and 22 y.o. granddaughter) Lives in: House/apartment; 1 level house Stairs: Yes: External: 3 steps; none Has following equipment at home: Hemi walker, Wheelchair (manual), bed side commode, and standard walker; also has Stedy  PLOF: Independent prior to August 2025 stroke.  Has aide 5 days/week for 2.5 hours each day.  Limited walking in home d/t fall concern; uses hemiwalker and 1 assist.  Uses manual w/c mainly or sits in chair.  Pt propels self a little in wheelchair but limited d/t L sided weakness; mostly gets pushed in w/c.   PATIENT GOALS: Being able to walk and be self sufficient on own.  Be able to walk without assistive device.  OBJECTIVE:  Note: Objective measures were completed at Evaluation unless otherwise noted.  DIAGNOSTIC FINDINGS:  MRI Brain 05/06/24: Multifocal acute right MCA territory infarcts including infarcts in the right basal ganglia, right frontal and right parietal lobes. Associated edema without mass effect. No midline shift. Areas of petechial hemorrhage in the right parietal lobe. Small additional acute infarct in the splenium of the corpus callosum. No mass occupying acute hemorrhage. No hydrocephalus. No mass lesion. Remote left cerebellar infarcts. Patchy T2/FLAIR hyperintensities in the white matter, compatible with chronic microvascular ischemic disease.  COGNITION: Overall cognitive status: Within functional limits for tasks assessed   SENSATION: No tingling/numbness reported.  Light  touch intact B LE's.  COORDINATION: Intact R LE heel to shin in sitting; unable to assess L LE d/t weakness  EDEMA:  No LE swelling noted  MUSCLE TONE: L UE/LE spasticity/tone noted  POSTURE: rounded shoulders and forward head  LOWER EXTREMITY MMT:    MMT Right Eval Left Eval  Hip flexion 4+/5 2/5  Hip extension    Hip abduction    Hip adduction    Hip internal rotation    Hip external rotation    Knee flexion 4+/5 2/5  Knee extension 4+/5 4-/5  Ankle dorsiflexion 4+/5 1/5  Ankle plantarflexion At least 3/5 AROM At least 1/5  Ankle inversion    Ankle eversion    (Blank rows = not tested)  TRANSFERS: Sit to stand: Mod A  Assistive device utilized: Hemi walker     Stand to sit: Mod A  Assistive device utilized: Hemi walker      GAIT: Findings: Gait Characteristics: L LE externally rotated with decreased L LE foot clearance and step length; increased R lean to advance L LE; decreased L heel strike, Distance walked: 24 feet, Assistive device utilized:Hemi walker, Level of assistance: Min A, and Comments: decreased gait speed; assist to steady  FUNCTIONAL TESTS:  5 times sit to stand: Not appropriate at this time d/t assist needs 10 meter walk test: Not appropriate at this time d/t assist needs/limited mobility  PATIENT SURVEYS:  TBA  TREATMENT DATE:  10/04/24  Self Care: BP and HR taken in sitting at rest beginning of session (see below for details). Vitals:   10/04/24 1249  BP: 121/66  Pulse: 69  Therapeutic listening/support: pt teary regarding situation (d/t loss of independence and stress placed on family d/t her assist needs) beginning of session; encouraged pt to discuss her feelings with a family member she felt comfortable with/trusted.  Gait training: x115 feet with R hemi-walker; CGA (pt wearing L AFO and toe cap on personal  shoe) Notes: vc's to increase L LE WB'ing and weight shift to L side; vc's to increase B LE step length  Therapeutic Activity: Standing balance: Standing on Airex: x30 seconds EO (CGA); x30 seconds x4 trials with EC (CGA to min assist for balance)  PATIENT EDUCATION: Education details: Continue to do HEP (as much as able).  Bring L AFO and shoes with toe cap cover each session to utilize for gait training. Person educated: Patient Education method: Explanation, Demonstration, Tactile cues, Verbal cues, and Handouts Education comprehension: verbalized understanding, returned demonstration, verbal cues required, tactile cues required, and needs further education  HOME EXERCISE PROGRAM: Pt reports she has assist at home for L LE exercises. Access Code: [email or web address] URL: [email or web address] Date: 09/19/2024 Prepared by: Damien Caulk  Exercises - Seated March  - 1 x daily - 3-5 x weekly - 2-3 sets - 10 reps - Seated Long Arc Quad  - 1 x daily - 3-5 x weekly - 1-2 sets - 10 reps - Seated Knee Flexion Slide  - 1 x daily - 3-5 x weekly - 1-2 sets - 5-10 reps - Seated Hip Adduction Squeeze with Ball  - 1 x daily - 3-5 x weekly - 1-2 sets - 10 reps - 3 second hold - Seated Hip Abduction  - 1 x daily - 3-5 x weekly - 1-2 sets - 10 reps  GOALS: Goals reviewed with patient? Yes  SHORT TERM GOALS: Target date: 09/22/2024   Pt will be supervision with initial HEP in order to improve strength and balance in order to decrease fall risk and improve function at home for ADL's. Baseline: Issued 09/19/24 Goal status: NOT MET (1st therapy visit since eval 09/19/24 and pt hasn't been able to do HEP yet that was issued on 09/19/24)  2.  Pt will be min assist with transfers with use of hemi-walker to improve ease of transfers for ADL's. Baseline: Mod assist with hemi-walker on Eval; mod assist 1st trial and min assist 2nd and 3rd trial with mod cueing on 09/22/24 Goal status: PROGRESSING   3.  Pt  will be able to ambulate 50 feet min assist with hemi-walker for safe mobility within home for ADL's. Baseline: 24 feet min assist with hemi-walker on Eval; 67 feet CGA with hemi-walker 09/22/24 Goal status: MET  LONG TERM GOALS: Target date: 10/20/2024  Pt will be independent with final HEP in order to improve strength and balance in order to decrease fall risk and improve function at home for ADL's. Baseline: To be issued Goal status: INITIAL  2.  Pt will be SBA with transfers with use of LRAD to improve ease and safety of transfers for ADL's. Baseline: Mod assist with hemi-walker Goal status: INITIAL  3.  Pt will be able to ambulate 100 feet SBA with LRAD for safe mobility within home for ADL's. Baseline: 24 feet min assist with hemi-walker Goal status: INITIAL  4.  Pt will decrease 5 Time Sit to Stand by at least  3 seconds in order to demonstrate clinically significant improvement in LE strength. Baseline: TBA (not appropriate on eval) Goal status: INITIAL  5.  Pt will increase by at least 0.13 m/s in order to demonstrate clinically significant improvement in community ambulation.  Baseline: TBA (not appropriate on eval) Goal status: INITIAL   ASSESSMENT:  CLINICAL IMPRESSION: Patient was seen today for physical therapy treatment to address self care, gait, and balance.  Focused session on active listening and support initially:  pt teary upon arrival (pt arrived late and does not like to be late; pt expressing concerns regarding situation--needing to depend on others and also stress it places on family; pt has caregiver but has limited hours and can not utilize them to take her to various appts).  Pt appeared to have improved mood after verbalizing concerns to therapist; patient plans to communicate (regarding her feelings) with a family member when timing feels right.  Pt then participated in gait training with hemi-walker and standing balance on compliant surface.  Improved  gait speed noted today with cueing.  They would continue to benefit from skilled PT to address impairments as noted and progress towards long term goals. ***  OBJECTIVE IMPAIRMENTS: Abnormal gait, cardiopulmonary status limiting activity, decreased activity tolerance, decreased balance, decreased cognition, decreased coordination, decreased endurance, decreased knowledge of condition, decreased knowledge of use of DME, decreased mobility, difficulty walking, decreased ROM, decreased strength, decreased safety awareness, impaired flexibility, impaired tone, impaired UE functional use, improper body mechanics, and postural dysfunction.   ACTIVITY LIMITATIONS: carrying, lifting, bending, standing, squatting, stairs, transfers, bed mobility, bathing, toileting, dressing, reach over head, hygiene/grooming, and locomotion level  PARTICIPATION LIMITATIONS: meal prep, cleaning, laundry, driving, shopping, and community activity  PERSONAL FACTORS: Age, Fitness, Past/current experiences, and 1-2 comorbidities: h/o prior CVA; CHF; NSVT are also affecting patient's functional outcome.   REHAB POTENTIAL: Good  CLINICAL DECISION MAKING: Evolving/moderate complexity  EVALUATION COMPLEXITY: Moderate  PLAN:  PT FREQUENCY: 2x/week  PT DURATION: 8 weeks  PLANNED INTERVENTIONS: 97164- PT Re-evaluation, 97750- Physical Performance Testing, 97110-Therapeutic exercises, 97530- Therapeutic activity, [id number]- Neuromuscular re-education, 97535- Self Care, [id number]- Manual therapy, [id number]- Gait training, [phone]- Orthotic Initial, [phone]- Orthotic/Prosthetic subsequent, [phone]- Aquatic Therapy, [phone]- Splinting, [phone]- Electrical stimulation (manual), [phone] (1-2 muscles), [phone] (3+ muscles)- Dry Needling, Patient/Family education, Balance training, Stair training, Taping, Joint mobilization, Spinal mobilization, Cognitive remediation, DME instructions, Wheelchair mobility training, Cryotherapy, Moist heat, and Biofeedback  PLAN  FOR NEXT SESSION: Check on HEP.  Transfer training.  Gait training with hemi-walker; pt to bring L LE AFO and shoes with toe cap cover to utilize during sessions.  Balance; strengthening; coordination.  Assess 5 time sit to stand when appropriate.  Has OT and SLP evaluations scheduled.   Damien Caulk, PT 10/04/2024, 9:04 PM        "

## 2024-10-04 NOTE — Progress Notes (Signed)
" ° °  Telephone encounter was:  Successful.  Complex Care Management Note Care Guide Note  10/04/2024 Name: [name] MRN: [id number] DOB: 03-Oct-1965  [name] is a 59 y.o. year old female who is a primary care patient of Newlin, Enobong, MD . The community resource team was consulted for assistance with Housing  SDOH screenings and interventions completed:  Yes  Social Drivers of Health From This Encounter   Food Insecurity: No Food Insecurity (10/04/2024)   Epic    Worried About Running Out of Food in the Last Year: Never true    Ran Out of Food in the Last Year: Never true  Housing: Unknown (10/04/2024)   Epic    Unable to Pay for Housing in the Last Year: No    Homeless in the Last Year: No  Financial Resource Strain: Medium Risk (10/04/2024)   Overall Financial Resource Strain (CARDIA)    Difficulty of Paying Living Expenses: Somewhat hard    SDOH Interventions Today    Flowsheet Row Most Recent Value  SDOH Interventions   Food Insecurity Interventions Community Resources Provided, [place] Resources Referral  Housing Interventions FindHelp Community Resource Referral, [employer] Resources Provided  Financial Strain Interventions Community Resources Provided, [employer] Referral     Care guide performed the following interventions: Patient provided with information about care guide support team and interviewed to confirm resource needs.Pt stated she would like Housing resources for [facility] to be mailed to her so she will have when she has income in place to start her search she will be prepared   Follow Up Plan:  No further follow up planned at this time. The patient has been provided with needed resources.  Encounter Outcome:  Patient Visit Completed    [facility]  [facility] Guide, Phone: [phone] Fax: [phone] Website: [email or web address]    "

## 2024-10-06 ENCOUNTER — Ambulatory Visit: Admitting: Physical Therapy

## 2024-10-11 ENCOUNTER — Ambulatory Visit: Admitting: Physical Therapy

## 2024-10-12 ENCOUNTER — Other Ambulatory Visit: Payer: Self-pay

## 2024-10-13 ENCOUNTER — Other Ambulatory Visit: Payer: Self-pay

## 2024-10-13 ENCOUNTER — Ambulatory Visit: Admitting: Physical Therapy

## 2024-10-13 ENCOUNTER — Encounter: Payer: Self-pay | Admitting: Physical Therapy

## 2024-10-13 VITALS — BP 133/67 | HR 79

## 2024-10-13 DIAGNOSIS — M6281 Muscle weakness (generalized): Secondary | ICD-10-CM

## 2024-10-13 DIAGNOSIS — R2689 Other abnormalities of gait and mobility: Secondary | ICD-10-CM

## 2024-10-13 DIAGNOSIS — R278 Other lack of coordination: Secondary | ICD-10-CM

## 2024-10-13 DIAGNOSIS — I69354 Hemiplegia and hemiparesis following cerebral infarction affecting left non-dominant side: Secondary | ICD-10-CM

## 2024-10-13 NOTE — Therapy (Signed)
 " OUTPATIENT PHYSICAL THERAPY NEURO TREATMENT   Patient Name: [name] MRN: [id number] DOB:1966/07/18, 59 y.o., female Today's Date: 10/13/2024   PCP: Delbert Clam, MD REFERRING PROVIDER: Delbert Clam, MD  END OF SESSION:  PT End of Session - 10/13/24 1224     Visit Number 7    Number of Visits 17   16 visits plus Eval   Date for Recertification  11/04/24   for scheduling delays   Authorization Type [facility] Medicaid    Authorization - Visit Number 7    Authorization - Number of Visits 27    PT Start Time 1222    PT Stop Time 1316    PT Time Calculation (min) 54 min    Equipment Utilized During Treatment Gait belt    Activity Tolerance Patient tolerated treatment well    Behavior During Therapy WFL for tasks assessed/performed          Past Medical History:  Diagnosis Date   Clotting disorder    on xarelto    Essential hypertension    Heart murmur    a. 10/2015 Echo: EF 60-65%, no rwma, mild AI/MR, sev dil LA/RA, PASP .   Noncompliance    NSVT (nonsustained ventricular tachycardia) (HCC)    a. 10/2015 during admission for CVA/AF.   Persistent atrial fibrillation (HCC)    a. CHA2DS2VASC = 4-->Xarelto ;  b. 02/2016 Successful DCCV.  c. Recurrent fib   Pneumonia 2012 x 2; 2015   Stroke [facility])    a. 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   Transient ischemic attack (TIA) 01/2013   Past Surgical History:  Procedure Laterality Date   CARDIOVERSION N/A 02/18/2013   Procedure: CARDIOVERSION;  Surgeon: Jerel Balding, MD;  Location: [facility] ENDOSCOPY;  Service: Cardiovascular;  Laterality: N/A;   CARDIOVERSION N/A 02/23/2013   Procedure: CARDIOVERSION;  Surgeon: Alm LELON Clay, MD;  Location: [facility] OR;  Service: Cardiovascular;  Laterality: N/A;  BESIDE CV   CARDIOVERSION N/A 03/12/2016   Procedure: CARDIOVERSION;  Surgeon: Ezra GORMAN Shuck, MD;  Location: [facility] ENDOSCOPY;  Service: Cardiovascular;   Laterality: N/A;   IR PERCUTANEOUS ART THROMBECTOMY/INFUSION INTRACRANIAL INC DIAG ANGIO  05/05/2024   IR US  GUIDE VASC ACCESS RIGHT  05/05/2024   RADIOLOGY WITH ANESTHESIA N/A 05/05/2024   Procedure: RADIOLOGY WITH ANESTHESIA;  Surgeon: Radiologist, Medication, MD;  Location: [facility] OR;  Service: Radiology;  Laterality: N/A;   RIGHT/LEFT HEART CATH AND CORONARY ANGIOGRAPHY N/A 04/02/2021   Procedure: RIGHT/LEFT HEART CATH AND CORONARY ANGIOGRAPHY;  Surgeon: Claudene Victory LELON, MD;  Location: [facility] INVASIVE CV LAB;  Service: Cardiovascular;  Laterality: N/A;   TEE WITHOUT CARDIOVERSION N/A 02/18/2013   Procedure: TRANSESOPHAGEAL ECHOCARDIOGRAM (TEE);  Surgeon: Jerel Balding, MD;  Location: [facility] ENDOSCOPY;  Service: Cardiovascular;  Laterality: N/A;   TUBAL LIGATION  1992   Patient Active Problem List   Diagnosis Date Noted   Permanent atrial fibrillation (HCC) 07/10/2024   Coping style affecting medical condition 05/13/2024   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 2 without complication (HCC) 05/11/2024   Dysphagia due to recent stroke 05/11/2024   Acute right ACA stroke (HCC) 05/09/2024   Acute ischemic right MCA stroke (HCC) 05/05/2024   Stroke (cerebrum) (HCC) 05/05/2024   Type 2 diabetes mellitus without complication, without long-term current use of insulin  (HCC) 06/17/2021   Coronary artery disease involving native coronary artery of native heart without angina pectoris 04/10/2021   Chronic combined systolic  and diastolic CHF (congestive heart failure) (HCC) 04/01/2021   Longstanding persistent atrial fibrillation (HCC) 04/16/2020   Secondary hypercoagulable state 04/16/2020   Edema leg 05/17/2019   Severe obesity (BMI 35.0-39.9) with comorbidity (HCC) 04/04/2019   Prediabetes 04/04/2019   Seasonal allergic rhinitis due to pollen 12/14/2018   Chronic back pain 08/23/2018   OSA on CPAP 06/23/2018   Headache 01/29/2017   Refractive errors 04/15/2016   Medical  non-compliance 11/06/2015   Hyperlipidemia LDL goal <70 11/06/2015   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   NSVT (nonsustained ventricular tachycardia) (HCC) 11/06/2015   History of CVA (cerebrovascular accident)    Claudication of both lower extremities 03/22/2015   Chronic atrial fibrillation (HCC) 04/12/2014   Pap smear for cervical cancer screening 03/28/2014   Cigarette smoker 06/15/2013   Ventral hernia 06/15/2013   Chronic anticoagulation 02/28/2013   Hx-TIA (transient ischemic attack) 02/05/13 02/15/2013   Essential hypertension 02/15/2013    ONSET DATE: 08/04/2024 (date of referral)  REFERRING DIAG: I69.354 (ICD-10-CM) - Hemiparesis affecting left side as late effect of stroke (HCC)  THERAPY DIAG:  Other abnormalities of gait and mobility  Muscle weakness (generalized)  Hemiplegia and hemiparesis following cerebral infarction affecting left non-dominant side (HCC)  Other lack of coordination  Rationale for Evaluation and Treatment: Rehabilitation  SUBJECTIVE:                                                                                                                                                                                             SUBJECTIVE STATEMENT: Pt reports missing 1 therapy session d/t having eyes dilated and couldn't see to participate in therapy; missed other visit d/t bad weather.  No current pain.  No recent falls.  Pt reports getting up by herself at home and walked without the walker (without lights on) to bathroom (holding onto furniture) and has been walking holding onto furniture (to bathroom) with family (SBA) since; going well.  Pt reporting wanting to get new pair of shoes and needs toe cap cover to put on it.  Pt reports doing better today (from last session) and talked to some of her family regarding concerns. Pt accompanied by: self and family member (daughter [name] and [name])  PERTINENT HISTORY:  PMH includes htn, NSVT, a-fib/atrial  flutter (s/p cardioversion 02/2016), stroke (cerebral infarction due to embolism of right middle cerebral artery status post IV TPA (in 10/2015),  right MCA stroke in 01/2018 (after running out of Xarelto ), right ACA stroke in 04/2024 (status post thrombectomy with residual left hemiparesis, dysarthria), neuropathic pain, and DM type 2.  PAIN:  Are you having pain? No  PRECAUTIONS: Fall;  L LE AFO  RED FLAGS: None   WEIGHT BEARING RESTRICTIONS: No  FALLS: Has patient fallen in last 6 months? No  LIVING ENVIRONMENT: Lives with: lives with their family (daughter and 57 y.o. granddaughter) Lives in: House/apartment; 1 level house Stairs: Yes: External: 3 steps; none Has following equipment at home: Hemi walker, Wheelchair (manual), bed side commode, and standard walker; also has Stedy  PLOF: Independent prior to August 2025 stroke.  Has aide 5 days/week for 2.5 hours each day.  Limited walking in home d/t fall concern; uses hemiwalker and 1 assist.  Uses manual w/c mainly or sits in chair.  Pt propels self a little in wheelchair but limited d/t L sided weakness; mostly gets pushed in w/c.   PATIENT GOALS: Being able to walk and be self sufficient on own.  Be able to walk without assistive device.  OBJECTIVE:  Note: Objective measures were completed at Evaluation unless otherwise noted.  DIAGNOSTIC FINDINGS:  MRI Brain 05/06/24: Multifocal acute right MCA territory infarcts including infarcts in the right basal ganglia, right frontal and right parietal lobes. Associated edema without mass effect. No midline shift. Areas of petechial hemorrhage in the right parietal lobe. Small additional acute infarct in the splenium of the corpus callosum. No mass occupying acute hemorrhage. No hydrocephalus. No mass lesion. Remote left cerebellar infarcts. Patchy T2/FLAIR hyperintensities in the white matter, compatible with chronic microvascular ischemic disease.  COGNITION: Overall cognitive  status: Within functional limits for tasks assessed   SENSATION: No tingling/numbness reported.  Light touch intact B LE's.  COORDINATION: Intact R LE heel to shin in sitting; unable to assess L LE d/t weakness  EDEMA:  No LE swelling noted  MUSCLE TONE: L UE/LE spasticity/tone noted  POSTURE: rounded shoulders and forward head  LOWER EXTREMITY MMT:    MMT Right Eval Left Eval  Hip flexion 4+/5 2/5  Hip extension    Hip abduction    Hip adduction    Hip internal rotation    Hip external rotation    Knee flexion 4+/5 2/5  Knee extension 4+/5 4-/5  Ankle dorsiflexion 4+/5 1/5  Ankle plantarflexion At least 3/5 AROM At least 1/5  Ankle inversion    Ankle eversion    (Blank rows = not tested)  TRANSFERS: Sit to stand: Mod A  Assistive device utilized: Hemi walker     Stand to sit: Mod A  Assistive device utilized: Hemi walker      GAIT: Findings: Gait Characteristics: L LE externally rotated with decreased L LE foot clearance and step length; increased R lean to advance L LE; decreased L heel strike, Distance walked: 24 feet, Assistive device utilized:Hemi walker, Level of assistance: Min A, and Comments: decreased gait speed; assist to steady  FUNCTIONAL TESTS:  5 times sit to stand: 61.29 seconds 10/13/24 10 meter walk test: Not appropriate at this time d/t assist needs/limited mobility  PATIENT SURVEYS:  TBA  TREATMENT DATE:  10/13/24  Self Care: BP and HR taken in sitting at rest beginning of session (see below for details). Vitals:   10/13/24 1231  BP: 133/67  Pulse: 79   Therapeutic Activity: 5 time sit to stand (for outcome measure): 61.29 seconds with R hemi-walker and use of R armrest Notes: pt requiring extra time to reposition L LE after 2nd time sitting d/t sitting too fast and L foot sliding forward Stand step turn transfer  manual w/c to chair CGA with hemi-walker use; SST transfer manual w/c to/from SCI-Fit with CGA to min assist (no use of hemi-walker)  Gait training: x115 feet with R hemi-walker; CGA (pt wearing L AFO and toe cap on personal shoe) Notes: cueing/initial min assist to increase L LE WB'ing and weight shift to L side; vc's to increase B LE step length and increase BOS; vc's to safely increase gait speed  Therapeutic Exercise: SciFit multi-peaks up to level 1 for 5 minutes using RUE/BLEs for neural priming for reciprocal movement, dynamic cardiovascular activity and increased amplitude of stepping. RPE of 4/10 following activity.  Average stride length 7.9 inches.  Assist for L LE positioning and to maintain neutral L LE position during exercise; vc's for L LE initiation/technique intermittently  PATIENT EDUCATION: Education details: Continue to do HEP (as much as able).  Bring L AFO and shoes with toe cap cover each session to utilize for gait training.  Fall prevention; have assist when walking at home for safety and have lights on. Person educated: Patient Education method: Explanation, Demonstration, Tactile cues, Verbal cues, and Handouts Education comprehension: verbalized understanding, returned demonstration, verbal cues required, tactile cues required, and needs further education  HOME EXERCISE PROGRAM: Pt reports she has assist at home for L LE exercises. Access Code: [email or web address] URL: [email or web address] Date: 09/19/2024 Prepared by: Damien Caulk  Exercises - Seated March  - 1 x daily - 3-5 x weekly - 2-3 sets - 10 reps - Seated Long Arc Quad  - 1 x daily - 3-5 x weekly - 1-2 sets - 10 reps - Seated Knee Flexion Slide  - 1 x daily - 3-5 x weekly - 1-2 sets - 5-10 reps - Seated Hip Adduction Squeeze with Ball  - 1 x daily - 3-5 x weekly - 1-2 sets - 10 reps - 3 second hold - Seated Hip Abduction  - 1 x daily - 3-5 x weekly - 1-2 sets - 10 reps  GOALS: Goals reviewed  with patient? Yes  SHORT TERM GOALS: Target date: 09/22/2024   Pt will be supervision with initial HEP in order to improve strength and balance in order to decrease fall risk and improve function at home for ADL's. Baseline: Issued 09/19/24 Goal status: NOT MET (1st therapy visit since eval 09/19/24 and pt hasn't been able to do HEP yet that was issued on 09/19/24)  2.  Pt will be min assist with transfers with use of hemi-walker to improve ease of transfers for ADL's. Baseline: Mod assist with hemi-walker on Eval; mod assist 1st trial and min assist 2nd and 3rd trial with mod cueing on 09/22/24 Goal status: MET (CGA with hemi-walker 10/13/24)  3.  Pt will be able to ambulate 50 feet min assist with hemi-walker for safe mobility within home for ADL's. Baseline: 24 feet min assist with hemi-walker on Eval; 67 feet CGA with hemi-walker 09/22/24 Goal status: MET  LONG TERM GOALS: Target date: 10/20/2024  Pt will be independent with final HEP in order to  improve strength and balance in order to decrease fall risk and improve function at home for ADL's. Baseline: To be issued Goal status: INITIAL  2.  Pt will be SBA with transfers with use of LRAD to improve ease and safety of transfers for ADL's. Baseline: Mod assist with hemi-walker Goal status: INITIAL  3.  Pt will be able to ambulate 100 feet SBA with LRAD for safe mobility within home for ADL's. Baseline: 24 feet min assist with hemi-walker Goal status: INITIAL  4.  Pt will decrease 5 Time Sit to Stand by at least 3 seconds in order to demonstrate clinically significant improvement in LE strength. Baseline: 61.29 seconds 10/13/24 Goal status: INITIAL  5.  Pt will increase by at least 0.13 m/s in order to demonstrate clinically significant improvement in community ambulation.  Baseline: TBA (not appropriate on eval) Goal status: INITIAL   ASSESSMENT:  CLINICAL IMPRESSION: Patient was seen today for physical therapy treatment to  address strength, transfers, and gait.  Focused session on assessing 5 time sit to stand (outcome measure for LTG's), gait training with R hemi-walker (focusing on increasing L LE weight shift, increasing BOS and step length, and safely increasing gait speed as able), and R UE/B LE strengthening.  Pt scored 61.29 seconds on the 5 time sit to stand test indicating pt is at increased risk for falling (>15 seconds = increased risk of falls).  Patient continues to be limited by L sided weakness and balance.  They demonstrate improvement in L LE weight shift during ambulation after initial cueing.  They would continue to benefit from skilled PT to address impairments as noted and progress towards long term goals.   OBJECTIVE IMPAIRMENTS: Abnormal gait, cardiopulmonary status limiting activity, decreased activity tolerance, decreased balance, decreased cognition, decreased coordination, decreased endurance, decreased knowledge of condition, decreased knowledge of use of DME, decreased mobility, difficulty walking, decreased ROM, decreased strength, decreased safety awareness, impaired flexibility, impaired tone, impaired UE functional use, improper body mechanics, and postural dysfunction.   ACTIVITY LIMITATIONS: carrying, lifting, bending, standing, squatting, stairs, transfers, bed mobility, bathing, toileting, dressing, reach over head, hygiene/grooming, and locomotion level  PARTICIPATION LIMITATIONS: meal prep, cleaning, laundry, driving, shopping, and community activity  PERSONAL FACTORS: Age, Fitness, Past/current experiences, and 1-2 comorbidities: h/o prior CVA; CHF; NSVT are also affecting patient's functional outcome.   REHAB POTENTIAL: Good  CLINICAL DECISION MAKING: Evolving/moderate complexity  EVALUATION COMPLEXITY: Moderate  PLAN:  PT FREQUENCY: 2x/week  PT DURATION: 8 weeks  PLANNED INTERVENTIONS: 97164- PT Re-evaluation, 97750- Physical Performance Testing, 97110-Therapeutic  exercises, 97530- Therapeutic activity, [id number]- Neuromuscular re-education, 97535- Self Care, [id number]- Manual therapy, [id number]- Gait training, [phone]- Orthotic Initial, [phone]- Orthotic/Prosthetic subsequent, [phone]- Aquatic Therapy, [phone]- Splinting, [phone]- Electrical stimulation (manual), [phone] (1-2 muscles), 20561 (3+ muscles)- Dry Needling, Patient/Family education, Balance training, Stair training, Taping, Joint mobilization, Spinal mobilization, Cognitive remediation, DME instructions, Wheelchair mobility training, Cryotherapy, Moist heat, and Biofeedback  PLAN FOR NEXT SESSION: Assess when appropriate (for LTG's).  Toe cap cover for anticipated new pair of shoes?  Check on HEP.  Transfer training.  Gait training with hemi-walker; pt to bring L LE AFO and shoes with toe cap cover to utilize during sessions.  Balance; strengthening; coordination.  Has OT and SLP evaluations scheduled.     Damien Caulk, PT 10/13/2024, 1:52 PM        "

## 2024-10-14 ENCOUNTER — Other Ambulatory Visit: Payer: Self-pay

## 2024-10-14 NOTE — Patient Outreach (Signed)
 Social Drivers of Health  Community Resource and Care Coordination Visit Note   10/14/2024  Name: [name] MRN: [id number] DOB:08-27-1966  Situation: Referral received for [facility] needs assessment and assistance related to None. I obtained verbal consent from Patient.  Visit completed with Patient on the phone.   Background:   SDOH Interventions Today    Flowsheet Row Most Recent Value  SDOH Interventions   Food Insecurity Interventions Intervention Not Indicated  Housing Interventions Community Resources Provided  [housing resources have been provided. patient currently lives with daughter.]  Transportation Interventions Patient Resources (Friends/Family), Payor Benefit  [BSW provided patient updated phone number for medicaid transportation vendor MTM for reservations.]  Utilities Interventions Intervention Not Indicated  Financial Strain Interventions Intervention Not Indicated     Assessment:   Goals Addressed             This Visit's Progress    COMPLETED: BSW goal       Current SDOH Barriers:  Financial constraints related to recent stroke Utility resources Dental need Rehab physical therapy need  Interventions: Patient interviewed and appropriate screenings performed Referred patient to community resources  Provided patient with information about utility assistance resources in the community, BSW will provide patient list of dental providers that accept medicaid.  09/01/2024 Pt informed BSW she has outpatient rehab set up for January and February 2026. Pt was provided with member services phone number for [facility] since dental provider list did not work for her. Pt will be calling to find in-network provider.  Pt mentioned needing referrals for other therapy services that will be provided at the rehab center. Pt was encouraged to f/u with Dr. Newlin about these referrals. 09/30/2024 BSW will provide patient housing and food resources via mail.  BSW encouraged patient  to reach out to [facility] and inquire about the possibility of assisting her with her current claim. Applicant has applied on her own with daughter and has not heard back from claim. Phone number provided. BSW provided applicant member services # for pt to reach out re dental providers.         Recommendation:   attend all scheduled provider appointments call for transportation assistance at least one week before appointments ask for help if you don't understand your health insurance benefits follow up with housing list regarding housing needs F/u with servant center about possible disability case management for pending disability application.   Follow Up Plan:   Patient has achieved all patient stated goals. [employer] will be closed. Patient has been provided contact information should new needs arise.   [name], BSW [place]/[employer] - [employer] Social Worker [phone]

## 2024-10-14 NOTE — Patient Instructions (Signed)
 Visit Information  [name] was given information about Medicaid Managed Care team care coordination services as a part of their [facility] Community Plan Medicaid benefit.   If you would like to schedule transportation through your [facility], please call the following number at least 2 days in advance of your appointment: [phone]   Rides for urgent appointments can also be made after hours by calling Member Services.  Call the Behavioral Health Crisis Line at [phone], at any time, 24 hours a day, 7 days a week. If you are in danger or need immediate medical attention call 911.  Patient verbalizes understanding of instructions and care plan provided today and agrees to view in MyChart. Active MyChart status and patient understanding of how to access instructions and care plan via MyChart confirmed with patient.     No further follow up required: no additional needs identified for intervention at this time.   [name], BSW [place]/VBCI - [employer] Social Worker [phone]   Following is a copy of your plan of care:   Goals Addressed             This Visit's Progress    COMPLETED: BSW goal       Current SDOH Barriers:  Financial constraints related to recent stroke Utility resources Dental need Rehab physical therapy need  Interventions: Patient interviewed and appropriate screenings performed Referred patient to community resources  Provided patient with information about utility assistance resources in the community, BSW will provide patient list of dental providers that accept medicaid.  09/01/2024 Pt informed BSW she has outpatient rehab set up for January and February 2026. Pt was provided with member services phone number for [facility] since dental provider list did not work for her. Pt will be calling to find in-network provider.  Pt mentioned needing referrals for other therapy services that will be provided at  the rehab center. Pt was encouraged to f/u with Dr. Newlin about these referrals. 09/30/2024 BSW will provide patient housing and food resources via mail.  BSW encouraged patient to reach out to [facility] and inquire about the possibility of assisting her with her current claim. Applicant has applied on her own with daughter and has not heard back from claim. Phone number provided. BSW provided applicant member services # for pt to reach out re dental providers.

## 2024-10-17 ENCOUNTER — Ambulatory Visit: Admitting: Physical Therapy

## 2024-10-17 ENCOUNTER — Ambulatory Visit

## 2024-10-17 ENCOUNTER — Ambulatory Visit: Admitting: Occupational Therapy

## 2024-10-21 ENCOUNTER — Ambulatory Visit: Admitting: Physical Therapy

## 2024-10-21 ENCOUNTER — Encounter: Payer: Self-pay | Admitting: Physical Therapy

## 2024-10-21 NOTE — Therapy (Incomplete)
 " OUTPATIENT PHYSICAL THERAPY NEURO TREATMENT   Patient Name: [name] MRN: [id number] DOB:Jan 06, 1966, 59 y.o., female Today's Date: 10/21/2024   PCP: Delbert Clam, MD REFERRING PROVIDER: Delbert Clam, MD  END OF SESSION:  PT End of Session - 10/21/24 1240     Visit Number 8    Number of Visits 17   16 visits plus Eval   Date for Recertification  11/04/24   for scheduling delays   Authorization Type [facility] Medicaid    Authorization - Visit Number 8    Authorization - Number of Visits 27    PT Start Time 1237   pt arrived late   Equipment Utilized During Treatment Gait belt    Activity Tolerance Patient tolerated treatment well    Behavior During Therapy [facility] for tasks assessed/performed          Past Medical History:  Diagnosis Date   Clotting disorder    on xarelto    Essential hypertension    Heart murmur    a. 10/2015 Echo: EF 60-65%, no rwma, mild AI/MR, sev dil LA/RA, PASP .   Noncompliance    NSVT (nonsustained ventricular tachycardia) (HCC)    a. 10/2015 during admission for CVA/AF.   Persistent atrial fibrillation (HCC)    a. CHA2DS2VASC = 4-->Xarelto ;  b. 02/2016 Successful DCCV.  c. Recurrent fib   Pneumonia 2012 x 2; 2015   Stroke [facility])    a. 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   Transient ischemic attack (TIA) 01/2013   Past Surgical History:  Procedure Laterality Date   CARDIOVERSION N/A 02/18/2013   Procedure: CARDIOVERSION;  Surgeon: Jerel Balding, MD;  Location: [facility] ENDOSCOPY;  Service: Cardiovascular;  Laterality: N/A;   CARDIOVERSION N/A 02/23/2013   Procedure: CARDIOVERSION;  Surgeon: Alm LELON Clay, MD;  Location: [facility] OR;  Service: Cardiovascular;  Laterality: N/A;  BESIDE CV   CARDIOVERSION N/A 03/12/2016   Procedure: CARDIOVERSION;  Surgeon: Ezra GORMAN Shuck, MD;  Location: [facility] ENDOSCOPY;  Service: Cardiovascular;  Laterality: N/A;   IR PERCUTANEOUS ART  THROMBECTOMY/INFUSION INTRACRANIAL INC DIAG ANGIO  05/05/2024   IR US  GUIDE VASC ACCESS RIGHT  05/05/2024   RADIOLOGY WITH ANESTHESIA N/A 05/05/2024   Procedure: RADIOLOGY WITH ANESTHESIA;  Surgeon: Radiologist, Medication, MD;  Location: [facility] OR;  Service: Radiology;  Laterality: N/A;   RIGHT/LEFT HEART CATH AND CORONARY ANGIOGRAPHY N/A 04/02/2021   Procedure: RIGHT/LEFT HEART CATH AND CORONARY ANGIOGRAPHY;  Surgeon: Claudene Victory LELON, MD;  Location: [facility] INVASIVE CV LAB;  Service: Cardiovascular;  Laterality: N/A;   TEE WITHOUT CARDIOVERSION N/A 02/18/2013   Procedure: TRANSESOPHAGEAL ECHOCARDIOGRAM (TEE);  Surgeon: Jerel Balding, MD;  Location: [facility] ENDOSCOPY;  Service: Cardiovascular;  Laterality: N/A;   TUBAL LIGATION  1992   Patient Active Problem List   Diagnosis Date Noted   Permanent atrial fibrillation (HCC) 07/10/2024   Coping style affecting medical condition 05/13/2024   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 2 without complication (HCC) 05/11/2024   Dysphagia due to recent stroke 05/11/2024   Acute right ACA stroke (HCC) 05/09/2024   Acute ischemic right MCA stroke (HCC) 05/05/2024   Stroke (cerebrum) (HCC) 05/05/2024   Type 2 diabetes mellitus without complication, without long-term current use of insulin  (HCC) 06/17/2021   Coronary artery disease involving native coronary artery of native heart without angina pectoris 04/10/2021   Chronic combined systolic and diastolic CHF (congestive heart failure) (HCC) 04/01/2021   Longstanding persistent  atrial fibrillation (HCC) 04/16/2020   Secondary hypercoagulable state 04/16/2020   Edema leg 05/17/2019   Severe obesity (BMI 35.0-39.9) with comorbidity (HCC) 04/04/2019   Prediabetes 04/04/2019   Seasonal allergic rhinitis due to pollen 12/14/2018   Chronic back pain 08/23/2018   OSA on CPAP 06/23/2018   Headache 01/29/2017   Refractive errors 04/15/2016   Medical non-compliance 11/06/2015   Hyperlipidemia LDL goal  <70 11/06/2015   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   NSVT (nonsustained ventricular tachycardia) (HCC) 11/06/2015   History of CVA (cerebrovascular accident)    Claudication of both lower extremities 03/22/2015   Chronic atrial fibrillation (HCC) 04/12/2014   Pap smear for cervical cancer screening 03/28/2014   Cigarette smoker 06/15/2013   Ventral hernia 06/15/2013   Chronic anticoagulation 02/28/2013   Hx-TIA (transient ischemic attack) 02/05/13 02/15/2013   Essential hypertension 02/15/2013    ONSET DATE: 08/04/2024 (date of referral)  REFERRING DIAG: I69.354 (ICD-10-CM) - Hemiparesis affecting left side as late effect of stroke (HCC)  THERAPY DIAG:  No diagnosis found.  Rationale for Evaluation and Treatment: Rehabilitation  SUBJECTIVE:                                                                                                                                                                                             SUBJECTIVE STATEMENT: No recent falls; no pain reported.  Pt reports shoe with toe cap got thrown away.  Has new shoe but arrived without it on d/t not fitting well with AFO. Pt accompanied by: self and family member (daughter [name] and [name])  PERTINENT HISTORY:  PMH includes htn, NSVT, a-fib/atrial flutter (s/p cardioversion 02/2016), stroke (cerebral infarction due to embolism of right middle cerebral artery status post IV TPA (in 10/2015),  right MCA stroke in 01/2018 (after running out of Xarelto ), right ACA stroke in 04/2024 (status post thrombectomy with residual left hemiparesis, dysarthria), neuropathic pain, and DM type 2.  PAIN:  Are you having pain? No  PRECAUTIONS: Fall; L LE AFO  RED FLAGS: None   WEIGHT BEARING RESTRICTIONS: No  FALLS: Has patient fallen in last 6 months? No  LIVING ENVIRONMENT: Lives with: lives with their family (daughter and 53 y.o. granddaughter) Lives in: House/apartment; 1 level house Stairs: Yes: External: 3  steps; none Has following equipment at home: Hemi walker, Wheelchair (manual), bed side commode, and standard walker; also has Stedy  PLOF: Independent prior to August 2025 stroke.  Has aide 5 days/week for 2.5 hours each day.  Limited walking in home d/t fall concern; uses hemiwalker and 1 assist.  Uses manual w/c mainly or sits in chair.  Pt propels  self a little in wheelchair but limited d/t L sided weakness; mostly gets pushed in w/c.   PATIENT GOALS: Being able to walk and be self sufficient on own.  Be able to walk without assistive device.  OBJECTIVE:  Note: Objective measures were completed at Evaluation unless otherwise noted.  DIAGNOSTIC FINDINGS:  MRI Brain 05/06/24: Multifocal acute right MCA territory infarcts including infarcts in the right basal ganglia, right frontal and right parietal lobes. Associated edema without mass effect. No midline shift. Areas of petechial hemorrhage in the right parietal lobe. Small additional acute infarct in the splenium of the corpus callosum. No mass occupying acute hemorrhage. No hydrocephalus. No mass lesion. Remote left cerebellar infarcts. Patchy T2/FLAIR hyperintensities in the white matter, compatible with chronic microvascular ischemic disease.  COGNITION: Overall cognitive status: Within functional limits for tasks assessed   SENSATION: No tingling/numbness reported.  Light touch intact B LE's.  COORDINATION: Intact R LE heel to shin in sitting; unable to assess L LE d/t weakness  EDEMA:  No LE swelling noted  MUSCLE TONE: L UE/LE spasticity/tone noted  POSTURE: rounded shoulders and forward head  LOWER EXTREMITY MMT:    MMT Right Eval Left Eval  Hip flexion 4+/5 2/5  Hip extension    Hip abduction    Hip adduction    Hip internal rotation    Hip external rotation    Knee flexion 4+/5 2/5  Knee extension 4+/5 4-/5  Ankle dorsiflexion 4+/5 1/5  Ankle plantarflexion At least 3/5 AROM At least 1/5  Ankle  inversion    Ankle eversion    (Blank rows = not tested)  TRANSFERS: Sit to stand: Mod A  Assistive device utilized: Hemi walker     Stand to sit: Mod A  Assistive device utilized: Hemi walker      GAIT: Findings: Gait Characteristics: L LE externally rotated with decreased L LE foot clearance and step length; increased R lean to advance L LE; decreased L heel strike, Distance walked: 24 feet, Assistive device utilized:Hemi walker, Level of assistance: Min A, and Comments: decreased gait speed; assist to steady  FUNCTIONAL TESTS:  5 times sit to stand: 61.29 seconds 10/13/24 10 meter walk test: Not appropriate at this time d/t assist needs/limited mobility  PATIENT SURVEYS:  TBA                                                                                                                              TREATMENT DATE:  10/21/24  Self Care: BP and HR taken in sitting at rest beginning of session (see below for details). There were no vitals filed for this visit.     *** 10/13/24  Self Care: BP and HR taken in sitting at rest beginning of session (see below for details). There were no vitals filed for this visit.  Therapeutic Activity: 5 time sit to stand (for outcome measure): 61.29 seconds with R hemi-walker and use of R armrest Notes:  pt requiring extra time to reposition L LE after 2nd time sitting d/t sitting too fast and L foot sliding forward Stand step turn transfer manual w/c to chair CGA with hemi-walker use; SST transfer manual w/c to/from SCI-Fit with CGA to min assist (no use of hemi-walker)  Gait training: x115 feet with R hemi-walker; CGA (pt wearing L AFO and toe cap on personal shoe) Notes: cueing/initial min assist to increase L LE WB'ing and weight shift to L side; vc's to increase B LE step length and increase BOS; vc's to safely increase gait speed  Therapeutic Exercise: SciFit multi-peaks up to level 1 for 5 minutes using RUE/BLEs for neural priming for  reciprocal movement, dynamic cardiovascular activity and increased amplitude of stepping. RPE of 4/10 following activity.  Average stride length 7.9 inches.  Assist for L LE positioning and to maintain neutral L LE position during exercise; vc's for L LE initiation/technique intermittently  PATIENT EDUCATION: Education details: Continue to do HEP (as much as able).  Bring L AFO and shoes with toe cap cover each session to utilize for gait training.  Fall prevention; have assist when walking at home for safety and have lights on. Person educated: Patient Education method: Explanation, Demonstration, Tactile cues, Verbal cues, and Handouts Education comprehension: verbalized understanding, returned demonstration, verbal cues required, tactile cues required, and needs further education  HOME EXERCISE PROGRAM: Pt reports she has assist at home for L LE exercises. Access Code: [email or web address] URL: [email or web address] Date: 09/19/2024 Prepared by: Damien Caulk  Exercises - Seated March  - 1 x daily - 3-5 x weekly - 2-3 sets - 10 reps - Seated Long Arc Quad  - 1 x daily - 3-5 x weekly - 1-2 sets - 10 reps - Seated Knee Flexion Slide  - 1 x daily - 3-5 x weekly - 1-2 sets - 5-10 reps - Seated Hip Adduction Squeeze with Ball  - 1 x daily - 3-5 x weekly - 1-2 sets - 10 reps - 3 second hold - Seated Hip Abduction  - 1 x daily - 3-5 x weekly - 1-2 sets - 10 reps  GOALS: Goals reviewed with patient? Yes  SHORT TERM GOALS: Target date: 09/22/2024   Pt will be supervision with initial HEP in order to improve strength and balance in order to decrease fall risk and improve function at home for ADL's. Baseline: Issued 09/19/24 Goal status: NOT MET (1st therapy visit since eval 09/19/24 and pt hasn't been able to do HEP yet that was issued on 09/19/24)  2.  Pt will be min assist with transfers with use of hemi-walker to improve ease of transfers for ADL's. Baseline: Mod assist with hemi-walker on  Eval; mod assist 1st trial and min assist 2nd and 3rd trial with mod cueing on 09/22/24 Goal status: MET (CGA with hemi-walker 10/13/24)  3.  Pt will be able to ambulate 50 feet min assist with hemi-walker for safe mobility within home for ADL's. Baseline: 24 feet min assist with hemi-walker on Eval; 67 feet CGA with hemi-walker 09/22/24 Goal status: MET  LONG TERM GOALS: Target date: 10/20/2024  Pt will be independent with final HEP in order to improve strength and balance in order to decrease fall risk and improve function at home for ADL's. Baseline: Issued Goal status: MET  2.  Pt will be SBA with transfers with use of LRAD to improve ease and safety of transfers for ADL's. Baseline: Mod assist with hemi-walker; min assist from manual w/c but CGA  from regular chair with armrests Goal status: PROGRESSING  3.  Pt will be able to ambulate 100 feet SBA with LRAD for safe mobility within home for ADL's. Baseline: 24 feet min assist with hemi-walker; feet CGA with R HW Goal status: PROGRESSING  4.  Pt will decrease 5 Time Sit to Stand by at least 3 seconds in order to demonstrate clinically significant improvement in LE strength. Baseline: 61.29 seconds 10/13/24; 39.0 seconds Goal status: MET  5.  Pt will increase by at least 0.13 m/s in order to demonstrate clinically significant improvement in community ambulation.  Baseline: TBA (not appropriate on eval); 2 min 6.66 seconds 10/21/24 Goal status: INITIAL   ASSESSMENT:  CLINICAL IMPRESSION: Patient was seen today for physical therapy treatment to address ***.  Focused session on ***.  Patient continues to be limited by ***.  They demonstrate improvement in ***.  They would continue to benefit from skilled PT to address impairments as noted and progress towards long term goals. *** Patient was seen today for physical therapy treatment to address strength, transfers, and gait.  Focused session on assessing 5 time sit to stand (outcome  measure for LTG's), gait training with R hemi-walker (focusing on increasing L LE weight shift, increasing BOS and step length, and safely increasing gait speed as able), and R UE/B LE strengthening.  Pt scored 61.29 seconds on the 5 time sit to stand test indicating pt is at increased risk for falling (>15 seconds = increased risk of falls).  Patient continues to be limited by L sided weakness and balance.  They demonstrate improvement in L LE weight shift during ambulation after initial cueing.  They would continue to benefit from skilled PT to address impairments as noted and progress towards long term goals.   OBJECTIVE IMPAIRMENTS: Abnormal gait, cardiopulmonary status limiting activity, decreased activity tolerance, decreased balance, decreased cognition, decreased coordination, decreased endurance, decreased knowledge of condition, decreased knowledge of use of DME, decreased mobility, difficulty walking, decreased ROM, decreased strength, decreased safety awareness, impaired flexibility, impaired tone, impaired UE functional use, improper body mechanics, and postural dysfunction.   ACTIVITY LIMITATIONS: carrying, lifting, bending, standing, squatting, stairs, transfers, bed mobility, bathing, toileting, dressing, reach over head, hygiene/grooming, and locomotion level  PARTICIPATION LIMITATIONS: meal prep, cleaning, laundry, driving, shopping, and community activity  PERSONAL FACTORS: Age, Fitness, Past/current experiences, and 1-2 comorbidities: h/o prior CVA; CHF; NSVT are also affecting patient's functional outcome.   REHAB POTENTIAL: Good  CLINICAL DECISION MAKING: Evolving/moderate complexity  EVALUATION COMPLEXITY: Moderate  PLAN:  PT FREQUENCY: 2x/week  PT DURATION: 8 weeks  PLANNED INTERVENTIONS: 97164- PT Re-evaluation, 97750- Physical Performance Testing, 97110-Therapeutic exercises, 97530- Therapeutic activity, [id number]- Neuromuscular re-education, 97535- Self Care, [id number]-  Manual therapy, [id number]- Gait training, [phone]- Orthotic Initial, [phone]- Orthotic/Prosthetic subsequent, [phone]- Aquatic Therapy, [phone]- Splinting, [phone]- Electrical stimulation (manual), [phone] (1-2 muscles), [phone] (3+ muscles)- Dry Needling, Patient/Family education, Balance training, Stair training, Taping, Joint mobilization, Spinal mobilization, Cognitive remediation, DME instructions, Wheelchair mobility training, Cryotherapy, Moist heat, and Biofeedback  PLAN FOR NEXT SESSION: Assess when appropriate (for LTG's).  Toe cap cover for anticipated new pair of shoes?  Check on HEP.  Transfer training.  Gait training with hemi-walker; pt to bring L LE AFO and shoes with toe cap cover to utilize during sessions.  Balance; strengthening; coordination.  Has OT and SLP evaluations scheduled.     Damien Caulk, PT 10/21/2024, 12:41 PM  1-2x/week for 4 weeks      "

## 2024-10-25 ENCOUNTER — Ambulatory Visit: Admitting: Occupational Therapy

## 2024-10-25 ENCOUNTER — Ambulatory Visit: Admitting: Physical Therapy

## 2024-10-26 ENCOUNTER — Telehealth: Admitting: Licensed Clinical Social Worker

## 2024-10-28 ENCOUNTER — Ambulatory Visit: Admitting: Physical Therapy

## 2024-10-28 ENCOUNTER — Ambulatory Visit: Admitting: Occupational Therapy

## 2024-10-28 ENCOUNTER — Ambulatory Visit

## 2024-11-01 ENCOUNTER — Ambulatory Visit: Admitting: Occupational Therapy

## 2024-11-01 ENCOUNTER — Ambulatory Visit: Admitting: Physical Therapy

## 2024-11-03 ENCOUNTER — Ambulatory Visit: Admitting: Occupational Therapy

## 2024-11-03 ENCOUNTER — Ambulatory Visit: Admitting: Physical Therapy

## 2024-11-08 ENCOUNTER — Ambulatory Visit

## 2024-11-08 ENCOUNTER — Ambulatory Visit: Admitting: Physical Therapy

## 2024-11-08 ENCOUNTER — Ambulatory Visit: Admitting: Occupational Therapy

## 2024-11-10 ENCOUNTER — Ambulatory Visit: Admitting: Occupational Therapy

## 2024-11-10 ENCOUNTER — Ambulatory Visit: Admitting: Physical Therapy

## 2024-11-16 ENCOUNTER — Telehealth: Admitting: *Deleted

## 2025-02-07 ENCOUNTER — Ambulatory Visit: Admitting: Adult Health

## 2025-03-21 ENCOUNTER — Ambulatory Visit: Payer: Self-pay | Admitting: Family Medicine
